# Patient Record
Sex: Male | Born: 1935 | Race: White | Hispanic: No | Smoking: Former smoker
Health system: Southern US, Community
[De-identification: ages and names within clinical notes are randomized; demographics above are authoritative.]

## PROBLEM LIST (undated history)

## (undated) DIAGNOSIS — I219 Acute myocardial infarction, unspecified: Secondary | ICD-10-CM

## (undated) DIAGNOSIS — L03119 Cellulitis of unspecified part of limb: Secondary | ICD-10-CM

## (undated) DIAGNOSIS — G709 Myoneural disorder, unspecified: Secondary | ICD-10-CM

## (undated) DIAGNOSIS — M1990 Unspecified osteoarthritis, unspecified site: Secondary | ICD-10-CM

## (undated) DIAGNOSIS — J449 Chronic obstructive pulmonary disease, unspecified: Secondary | ICD-10-CM

## (undated) DIAGNOSIS — I700 Atherosclerosis of aorta: Secondary | ICD-10-CM

## (undated) DIAGNOSIS — I639 Cerebral infarction, unspecified: Secondary | ICD-10-CM

## (undated) DIAGNOSIS — I5020 Unspecified systolic (congestive) heart failure: Secondary | ICD-10-CM

## (undated) DIAGNOSIS — I209 Angina pectoris, unspecified: Secondary | ICD-10-CM

## (undated) DIAGNOSIS — E119 Type 2 diabetes mellitus without complications: Secondary | ICD-10-CM

## (undated) DIAGNOSIS — I2510 Atherosclerotic heart disease of native coronary artery without angina pectoris: Secondary | ICD-10-CM

## (undated) DIAGNOSIS — I6529 Occlusion and stenosis of unspecified carotid artery: Secondary | ICD-10-CM

## (undated) DIAGNOSIS — R011 Cardiac murmur, unspecified: Secondary | ICD-10-CM

## (undated) DIAGNOSIS — J189 Pneumonia, unspecified organism: Secondary | ICD-10-CM

## (undated) DIAGNOSIS — I509 Heart failure, unspecified: Secondary | ICD-10-CM

## (undated) DIAGNOSIS — E785 Hyperlipidemia, unspecified: Secondary | ICD-10-CM

## (undated) DIAGNOSIS — E1151 Type 2 diabetes mellitus with diabetic peripheral angiopathy without gangrene: Secondary | ICD-10-CM

## (undated) DIAGNOSIS — I255 Ischemic cardiomyopathy: Secondary | ICD-10-CM

## (undated) DIAGNOSIS — N400 Enlarged prostate without lower urinary tract symptoms: Secondary | ICD-10-CM

## (undated) DIAGNOSIS — K219 Gastro-esophageal reflux disease without esophagitis: Secondary | ICD-10-CM

## (undated) DIAGNOSIS — I10 Essential (primary) hypertension: Secondary | ICD-10-CM

## (undated) HISTORY — DX: Atherosclerotic heart disease of native coronary artery without angina pectoris: I25.10

## (undated) HISTORY — DX: Myoneural disorder, unspecified: G70.9

## (undated) HISTORY — DX: Hyperlipidemia, unspecified: E78.5

## (undated) HISTORY — DX: Unspecified systolic (congestive) heart failure: I50.20

## (undated) HISTORY — DX: Essential (primary) hypertension: I10

## (undated) HISTORY — DX: Chronic obstructive pulmonary disease, unspecified: J44.9

## (undated) HISTORY — PX: WRIST SURGERY: SHX841

## (undated) HISTORY — PX: UPPER GASTROINTESTINAL ENDOSCOPY: SHX188

## (undated) HISTORY — DX: Type 2 diabetes mellitus with diabetic peripheral angiopathy without gangrene: E11.51

## (undated) HISTORY — DX: Ischemic cardiomyopathy: I25.5

## (undated) HISTORY — DX: Occlusion and stenosis of unspecified carotid artery: I65.29

## (undated) HISTORY — DX: Cerebral infarction, unspecified: I63.9

## (undated) HISTORY — DX: Cellulitis of unspecified part of limb: L03.119

## (undated) NOTE — *Deleted (*Deleted)
So I am now dealing with pain in Henry Lindsey thigh where the tourniquet was placed yesterday. Dr. Odis Lindsey thought it was the muscle so he gave a one time dose of Robaxin. He is still having 7/10 pain there after the muscle relaxer, 2 mg of Morphine, and a total of 20 oxycodones . I see he has had a blood clot in the past is there anyway we may can do an ultrasound or if you have any ideas I would love to hear your input. Henry Lindsey stated that he spoke with Dr Henry Lindsey. That is a very uncommon presentation for DVT post op day one. Plus he is on Xarelto. Usually it's from the tourniquet. We think he is okay to discharge. He was supposed to follow up with me on Monday for dressing change anyway. Just have him call the office [phone] and change that to 10am on this Friday

---

## 2000-07-03 ENCOUNTER — Ambulatory Visit (HOSPITAL_COMMUNITY): Admission: RE | Admit: 2000-07-03 | Discharge: 2000-07-03 | Payer: Self-pay | Admitting: *Deleted

## 2010-11-21 DIAGNOSIS — I639 Cerebral infarction, unspecified: Secondary | ICD-10-CM

## 2010-11-21 DIAGNOSIS — I6529 Occlusion and stenosis of unspecified carotid artery: Secondary | ICD-10-CM

## 2010-11-21 HISTORY — DX: Cerebral infarction, unspecified: I63.9

## 2010-11-21 HISTORY — DX: Occlusion and stenosis of unspecified carotid artery: I65.29

## 2010-12-09 ENCOUNTER — Inpatient Hospital Stay: Payer: Self-pay | Admitting: Internal Medicine

## 2011-05-30 ENCOUNTER — Encounter: Payer: Self-pay | Admitting: Internal Medicine

## 2011-06-14 ENCOUNTER — Other Ambulatory Visit: Payer: Self-pay | Admitting: Internal Medicine

## 2011-06-14 MED ORDER — OXYCODONE HCL 15 MG PO TB12: 15.0000 mg | ORAL_TABLET | Freq: Two times a day (BID) | ORAL | Status: DC

## 2011-06-14 MED ORDER — HYDROCODONE-ACETAMINOPHEN 5-500 MG PO TABS: 1.0000 | ORAL_TABLET | Freq: Four times a day (QID) | ORAL | Status: DC

## 2011-06-24 ENCOUNTER — Other Ambulatory Visit: Payer: Self-pay | Admitting: Internal Medicine

## 2011-07-08 ENCOUNTER — Telehealth: Payer: Self-pay | Admitting: Internal Medicine

## 2011-07-08 NOTE — Telephone Encounter (Signed)
Pt would like to know if you have 25-75 insulin samples that he takes.  Please advise pt

## 2011-07-09 NOTE — Telephone Encounter (Signed)
Tried calling patient, but got no answer. Will try again later.

## 2011-07-11 ENCOUNTER — Other Ambulatory Visit: Payer: Self-pay | Admitting: Internal Medicine

## 2011-07-11 NOTE — Telephone Encounter (Signed)
Pt walked in wanting rx for oxcontin 15mg   Please advise pt when rx is ready

## 2011-07-11 NOTE — Telephone Encounter (Signed)
Pt also wanted dr Darrick Huntsman know that dr Katrinka Blazing @ [facility] is starting a series of efferxx shots on left knee  This started Monday 06/28/11/rbh

## 2011-07-12 MED ORDER — OXYCODONE HCL 15 MG PO TB12: 15.0000 mg | ORAL_TABLET | Freq: Two times a day (BID) | ORAL | Status: DC

## 2011-08-05 ENCOUNTER — Ambulatory Visit (INDEPENDENT_AMBULATORY_CARE_PROVIDER_SITE_OTHER): Payer: 59 | Admitting: Internal Medicine

## 2011-08-05 ENCOUNTER — Encounter: Payer: Self-pay | Admitting: Internal Medicine

## 2011-08-05 DIAGNOSIS — C44319 Basal cell carcinoma of skin of other parts of face: Secondary | ICD-10-CM

## 2011-08-05 DIAGNOSIS — I639 Cerebral infarction, unspecified: Secondary | ICD-10-CM

## 2011-08-05 DIAGNOSIS — M1712 Unilateral primary osteoarthritis, left knee: Secondary | ICD-10-CM

## 2011-08-05 DIAGNOSIS — E1142 Type 2 diabetes mellitus with diabetic polyneuropathy: Secondary | ICD-10-CM | POA: Insufficient documentation

## 2011-08-05 DIAGNOSIS — I10 Essential (primary) hypertension: Secondary | ICD-10-CM

## 2011-08-05 DIAGNOSIS — E785 Hyperlipidemia, unspecified: Secondary | ICD-10-CM | POA: Insufficient documentation

## 2011-08-05 DIAGNOSIS — I6350 Cerebral infarction due to unspecified occlusion or stenosis of unspecified cerebral artery: Secondary | ICD-10-CM

## 2011-08-05 DIAGNOSIS — E119 Type 2 diabetes mellitus without complications: Secondary | ICD-10-CM

## 2011-08-05 DIAGNOSIS — Z8673 Personal history of transient ischemic attack (TIA), and cerebral infarction without residual deficits: Secondary | ICD-10-CM | POA: Insufficient documentation

## 2011-08-05 DIAGNOSIS — Z85828 Personal history of other malignant neoplasm of skin: Secondary | ICD-10-CM | POA: Insufficient documentation

## 2011-08-05 DIAGNOSIS — M1710 Unilateral primary osteoarthritis, unspecified knee: Secondary | ICD-10-CM

## 2011-08-05 MED ORDER — GLIPIZIDE 10 MG PO TABS: 10.0000 mg | ORAL_TABLET | Freq: Two times a day (BID) | ORAL | Status: DC

## 2011-08-05 MED ORDER — CLOPIDOGREL BISULFATE 75 MG PO TABS: 75.0000 mg | ORAL_TABLET | Freq: Every day | ORAL | Status: DC

## 2011-08-05 MED ORDER — INSULIN REGULAR HUMAN 100 UNIT/ML IJ SOLN: INTRAMUSCULAR | Status: DC

## 2011-08-05 MED ORDER — "INSULIN SYRINGE 29G X 1/2"" 0.5 ML MISC": 1.0000 | Freq: Three times a day (TID) | Status: DC

## 2011-08-05 MED ORDER — ATORVASTATIN CALCIUM 40 MG PO TABS: 40.0000 mg | ORAL_TABLET | Freq: Every day | ORAL | Status: DC

## 2011-08-05 NOTE — Patient Instructions (Addendum)
We are using hydrocodone for pain control  Up to 4 daily as needed for knee pain .  The glipizide/metformin is expensive because it is a combination of two cheap meds.  We will split them up to save money.    Continue metformin 1000 mg twice daily and add glipizide 10 mg twice daily.    We will change the insulin to a bottle and will have you give 10 units three times daily before your meal.  Do not skip meals   We are doubling the dose of atorvastatin so you can take 1/2 daily to save money

## 2011-08-05 NOTE — Assessment & Plan Note (Signed)
On statin therapy.  Lipids due. Goal LDL < 70

## 2011-08-05 NOTE — Progress Notes (Signed)
  Subjective:    Patient ID: FREEDOM PEDDY, male    DOB: September 14, 1933, 77 y.o.   MRN: [id number]  HPI  Mr. Binh is a 77 yo white male with a history of diabetes, hypertension, hyperlipidemia, cerebrovascular disease is here for followup on chronic medical issues.  His chief concern is the financial impact of being in the doughnut hole.  His medications are costing him over $  400/month.         Review of Systems  Constitutional: Negative for fever, chills, diaphoresis, activity change, appetite change, fatigue and unexpected weight change.  HENT: Negative for hearing loss, ear pain, nosebleeds, congestion, sore throat, facial swelling, rhinorrhea, sneezing, drooling, mouth sores, trouble swallowing, neck pain, neck stiffness, dental problem, voice change, postnasal drip, sinus pressure, tinnitus and ear discharge.   Eyes: Negative for photophobia, pain, discharge, redness, itching and visual disturbance.  Respiratory: Negative for apnea, cough, choking, chest tightness, shortness of breath, wheezing and stridor.   Cardiovascular: Negative for chest pain, palpitations and leg swelling.  Gastrointestinal: Negative for nausea, vomiting, abdominal pain, diarrhea, constipation, blood in stool, abdominal distention, anal bleeding and rectal pain.  Genitourinary: Negative for dysuria, urgency, frequency, hematuria, flank pain, decreased urine volume, scrotal swelling, difficulty urinating and testicular pain.  Musculoskeletal: Negative for myalgias, back pain, joint swelling, arthralgias and gait problem.  Skin: Negative for color change, rash and wound.  Neurological: Negative for dizziness, tremors, seizures, syncope, speech difficulty, weakness, light-headedness, numbness and headaches.  Psychiatric/Behavioral: Negative for suicidal ideas, hallucinations, behavioral problems, confusion, sleep disturbance, dysphoric mood, decreased concentration and agitation. The patient is not nervous/anxious.           Objective:   Physical Exam  Constitutional: He is oriented to person, place, and time.  HENT:  Head: Normocephalic and atraumatic.  Mouth/Throat: Oropharynx is clear and moist.  Eyes: Conjunctivae and EOM are normal.  Neck: Normal range of motion. Neck supple. No JVD present. No thyromegaly present.  Cardiovascular: Normal rate, regular rhythm and normal heart sounds.   Pulmonary/Chest: Effort normal and breath sounds normal. He has no wheezes. He has no rales.  Abdominal: Soft. Bowel sounds are normal. He exhibits no mass. There is no tenderness. There is no rebound.  Musculoskeletal: Normal range of motion. He exhibits no edema.  Neurological: He is alert and oriented to person, place, and time.  Skin: Skin is warm and dry.  Psychiatric: He has a normal mood and affect.          Assessment & Plan:

## 2011-08-05 NOTE — Assessment & Plan Note (Signed)
Changing medications to save money, to Regular insulin tid, and splitting metformin and glipizide.

## 2011-08-05 NOTE — Assessment & Plan Note (Signed)
Changing antiplatelet to plavix/asa to save money; continue statin, hypertension control

## 2011-08-05 NOTE — Assessment & Plan Note (Signed)
Not currently at goal.  He was asked to bring his home bp cuff to his next lab visit for repeat BP check.

## 2011-08-06 ENCOUNTER — Other Ambulatory Visit (INDEPENDENT_AMBULATORY_CARE_PROVIDER_SITE_OTHER): Payer: 59 | Admitting: Internal Medicine

## 2011-08-06 DIAGNOSIS — I10 Essential (primary) hypertension: Secondary | ICD-10-CM

## 2011-08-06 DIAGNOSIS — E785 Hyperlipidemia, unspecified: Secondary | ICD-10-CM

## 2011-08-06 DIAGNOSIS — E119 Type 2 diabetes mellitus without complications: Secondary | ICD-10-CM

## 2011-08-06 LAB — LIPID PANEL
HDL: 32.9 mg/dL — ABNORMAL LOW (ref >39.00)
Triglycerides: 159 mg/dL — ABNORMAL HIGH (ref 0.0–149.0)

## 2011-08-06 LAB — COMPREHENSIVE METABOLIC PANEL
BUN: 15 mg/dL (ref 6–23)
CO2: 30 mEq/L (ref 19–32)
Calcium: 9.5 mg/dL (ref 8.4–10.5)
Chloride: 103 mEq/L (ref 96–112)
Creatinine, Ser: 1.1 mg/dL (ref 0.4–1.5)
GFR: 67.7 mL/min (ref >60.00)
Glucose, Bld: 152 mg/dL — ABNORMAL HIGH (ref 70–99)

## 2011-08-12 ENCOUNTER — Telehealth: Payer: Self-pay | Admitting: Internal Medicine

## 2011-08-12 MED ORDER — OXYCODONE HCL 15 MG PO TB12: 15.0000 mg | ORAL_TABLET | Freq: Two times a day (BID) | ORAL | Status: DC

## 2011-08-12 NOTE — Telephone Encounter (Signed)
Pt  Needs refill oxycontin 15mg  tablets take 1 tablet by mouth twice daily Please call when rx is ready  Pt would also like results to his lab work he had done last week

## 2011-08-12 NOTE — Telephone Encounter (Signed)
Sent refill request to Dr. Darrick Huntsman and patient notified of lab result.

## 2011-08-19 ENCOUNTER — Other Ambulatory Visit: Payer: Self-pay | Admitting: Internal Medicine

## 2011-08-19 DIAGNOSIS — M1710 Unilateral primary osteoarthritis, unspecified knee: Secondary | ICD-10-CM

## 2011-08-19 NOTE — Telephone Encounter (Signed)
Ok to refill mr Henry Lindsey's hydrocodone rx ., will print out new rx with refills for pharmacy

## 2011-09-10 ENCOUNTER — Other Ambulatory Visit: Payer: Self-pay | Admitting: Internal Medicine

## 2011-09-10 MED ORDER — OXYCODONE HCL 15 MG PO TB12: 15.0000 mg | ORAL_TABLET | Freq: Two times a day (BID) | ORAL | Status: DC

## 2011-09-10 NOTE — Telephone Encounter (Signed)
Pt came in needs refill on  oxycontin 15 mg tablets  Please call when rx is ready  Pt also wants to get rx for pneumonia shot send this to  [facility]  [address]

## 2011-09-10 NOTE — Telephone Encounter (Signed)
Is it okay to send rx for pneumonia vaccine?

## 2011-09-10 NOTE — Telephone Encounter (Signed)
I HAVE PRINTED OUT REFILLS FOR NOV AND DEC

## 2011-09-11 NOTE — Telephone Encounter (Signed)
Patient notified Rx is ready to be picked up.  

## 2011-09-19 ENCOUNTER — Other Ambulatory Visit: Payer: Self-pay | Admitting: Internal Medicine

## 2011-09-19 MED ORDER — PNEUMOCOCCAL VAC POLYVALENT 25 MCG/0.5ML IJ INJ: 0.5000 mL | INJECTION | Freq: Once | INTRAMUSCULAR | Status: DC

## 2011-09-20 ENCOUNTER — Other Ambulatory Visit: Payer: Self-pay | Admitting: Internal Medicine

## 2011-09-20 MED ORDER — AMLODIPINE BESYLATE 10 MG PO TABS: 10.0000 mg | ORAL_TABLET | Freq: Every day | ORAL | Status: DC

## 2011-09-26 ENCOUNTER — Other Ambulatory Visit: Payer: Self-pay | Admitting: Internal Medicine

## 2011-09-26 NOTE — Telephone Encounter (Signed)
Please let Henry Lindsey know that now that he is taking oxycontin for his pain,  He needs to  stop the soma because the combination is very addictive and not recommedned in patients over 65.  So i am not refilling the soma anymore.

## 2011-09-27 ENCOUNTER — Telehealth: Payer: Self-pay | Admitting: Internal Medicine

## 2011-09-27 NOTE — Telephone Encounter (Signed)
Patient has been notified

## 2011-09-27 NOTE — Telephone Encounter (Signed)
Patient stopped by and would like to discontinue his oxycontin 15 mg, he states they don't seem to be working. He would like to continue on his Soma 3x a day 350mg , he uses [facility] on [address] [place].  Please advise.

## 2011-09-27 NOTE — Telephone Encounter (Signed)
I advised patient he needs to make an appt to discuss this with you but he still wanted this message sent.  Please advise.

## 2011-09-30 NOTE — Telephone Encounter (Signed)
Denied.  Tresa Garter is on the list of medications that should not be prescribed for people over 65 and I will not prescribe it anymore.

## 2011-10-01 NOTE — Telephone Encounter (Signed)
Patient notified

## 2011-10-23 ENCOUNTER — Other Ambulatory Visit: Payer: Self-pay | Admitting: Internal Medicine

## 2011-11-04 ENCOUNTER — Other Ambulatory Visit: Payer: Self-pay | Admitting: Internal Medicine

## 2011-11-08 ENCOUNTER — Telehealth: Payer: Self-pay | Admitting: *Deleted

## 2011-11-08 NOTE — Telephone Encounter (Signed)
I do not endorse diabetic shoes,  They are not proven to be effective nor necessary

## 2011-11-08 NOTE — Telephone Encounter (Signed)
Form stating denied faxed back to company

## 2011-11-08 NOTE — Telephone Encounter (Signed)
Form for diabetic shoes is in your red folder.  I checked with the pt's wife and he does want these shoes.

## 2011-11-11 ENCOUNTER — Encounter: Payer: Self-pay | Admitting: Internal Medicine

## 2011-11-11 ENCOUNTER — Ambulatory Visit (INDEPENDENT_AMBULATORY_CARE_PROVIDER_SITE_OTHER): Payer: 59 | Admitting: Internal Medicine

## 2011-11-11 ENCOUNTER — Telehealth: Payer: Self-pay | Admitting: Internal Medicine

## 2011-11-11 DIAGNOSIS — I10 Essential (primary) hypertension: Secondary | ICD-10-CM

## 2011-11-11 DIAGNOSIS — E119 Type 2 diabetes mellitus without complications: Secondary | ICD-10-CM

## 2011-11-11 DIAGNOSIS — E785 Hyperlipidemia, unspecified: Secondary | ICD-10-CM

## 2011-11-11 MED ORDER — CARISOPRODOL 350 MG PO TABS: 350.0000 mg | ORAL_TABLET | Freq: Three times a day (TID) | ORAL | Status: DC | PRN

## 2011-11-11 MED ORDER — PNEUMOCOCCAL VAC POLYVALENT 25 MCG/0.5ML IJ INJ: 0.5000 mL | INJECTION | Freq: Once | INTRAMUSCULAR | Status: DC

## 2011-11-11 NOTE — Assessment & Plan Note (Signed)
secondary to hypertriglyceridemia .  He has been taking fenofibrate ,  Samples of Trilipix given.

## 2011-11-11 NOTE — Progress Notes (Signed)
Subjective:    Patient ID: Henry Lindsey, male    DOB: 07/22/1933, 78 y.o.   MRN: [id number]  HPI  Henry Lindsey is a 78 yr old white male with history of hypertension, hyperlipidemia, DJD affecting both knees, and Diabetes who presents for 3 month followup.,  He has lost the nails off of both great toes since using conservative treatment for onychomycosis. He states that he has been filing the top of both nails daily after shower and applying vinegar and or and about 2 weeks ago and he notes that the nails so dissolved leaving some cuticle behind which is removed. He has not any pain or bleeding. He received a cortisone injection in both knees 10 days ago and has noted elevations from 160 to 180 both fasting and post prandially.  He is using regular insulin 10 units tid pre meal and is is not carbohydrate counting or adjusting his insulin. He prefers to use insulin vials to save money since he is now on the donut hole. Past Medical History  Diagnosis Date  . Diabetes mellitus   . Neuromuscular disorder   . Hyperlipidemia   . Hypertension   . CVA (cerebral infarction)    Current Outpatient Prescriptions on File Prior to Visit  Medication Sig Dispense Refill  . amLODipine (NORVASC) 10 MG tablet Take 1 tablet (10 mg total) by mouth daily.  30 tablet  3  . atorvastatin (LIPITOR) 40 MG tablet Take 1 tablet (40 mg total) by mouth daily.  30 tablet  6  . clopidogrel (PLAVIX) 75 MG tablet Take 1 tablet (75 mg total) by mouth daily.  30 tablet  11  . fenofibrate micronized (LOFIBRA) 134 MG capsule Take 134 mg by mouth daily.        [name] glipiZIDE (GLUCOTROL) 10 MG tablet Take 1 tablet (10 mg total) by mouth 2 (two) times daily.  60 tablet  11  . HYDROcodone-acetaminophen (VICODIN) 5-500 MG per tablet TAKE 1 TABLET BY MOUTH FOUR TIMES DAILY  120 tablet  3  . Insulin Lispro Prot & Lispro (HUMALOG MIX 75/25 KWIKPEN [place]) Inject 20 Units into the skin 2 (two) times daily.        . insulin regular (HUMULIN  R,NOVOLIN R) 100 units/mL injection 10 units three times daily before meals.  10 mL  3  . INSULIN SYRINGE .5CC/29G 29G X 1/2" 0.5 ML MISC 1 Syringe by Does not apply route 3 (three) times daily.  100 each  6  . metFORMIN (GLUCOPHAGE) 1000 MG tablet Take 1,000 mg by mouth 2 (two) times daily with a meal.        . metoprolol succinate (TOPROL-XL) 100 MG 24 hr tablet TAKE ONE TABLET BY MOUTH DAILY  30 tablet  6    Review of Systems  Constitutional: Negative for fever, chills, diaphoresis, activity change, appetite change, fatigue and unexpected weight change.  HENT: Negative for hearing loss, ear pain, nosebleeds, congestion, sore throat, facial swelling, rhinorrhea, sneezing, drooling, mouth sores, trouble swallowing, neck pain, neck stiffness, dental problem, voice change, postnasal drip, sinus pressure, tinnitus and ear discharge.   Eyes: Negative for photophobia, pain, discharge, redness, itching and visual disturbance.  Respiratory: Negative for apnea, cough, choking, chest tightness, shortness of breath, wheezing and stridor.   Cardiovascular: Negative for chest pain, palpitations and leg swelling.  Gastrointestinal: Negative for nausea, vomiting, abdominal pain, diarrhea, constipation, blood in stool, abdominal distention, anal bleeding and rectal pain.  Genitourinary: Negative for dysuria, urgency, frequency, hematuria, flank  pain, decreased urine volume, scrotal swelling, difficulty urinating and testicular pain.  Musculoskeletal: Negative for myalgias, back pain, joint swelling, arthralgias and gait problem.  Skin: Negative for color change, rash and wound.  Neurological: Negative for dizziness, tremors, seizures, syncope, speech difficulty, weakness, light-headedness, numbness and headaches.  Psychiatric/Behavioral: Negative for suicidal ideas, hallucinations, behavioral problems, confusion, sleep disturbance, dysphoric mood, decreased concentration and agitation. The patient is not  nervous/anxious.    BP 140/82  Pulse 66  Temp(Src) 97.6 F (36.4 C) (Oral)  Wt 199 lb (90.266 kg)  SpO2 97%     Objective:   Physical Exam  Constitutional: He is oriented to person, place, and time.  HENT:  Head: Normocephalic and atraumatic.  Mouth/Throat: Oropharynx is clear and moist.  Eyes: Conjunctivae and EOM are normal.  Neck: Normal range of motion. Neck supple. No JVD present. No thyromegaly present.  Cardiovascular: Normal rate, regular rhythm and normal heart sounds.   Pulmonary/Chest: Effort normal and breath sounds normal. He has no wheezes. He has no rales.  Abdominal: Soft. Bowel sounds are normal. He exhibits no mass. There is no tenderness. There is no rebound.  Musculoskeletal: Normal range of motion. He exhibits no edema.  Neurological: He is alert and oriented to person, place, and time. He has normal strength. No cranial nerve deficit or sensory deficit.  Skin: Skin is warm and dry.  Psychiatric: He has a normal mood and affect.      Assessment & Plan:   Diabetes mellitus type II, uncontrolled Due for hgba1c  Today.  Had long discussion about diabetic shoes which he wants, which I will not  endorse.   Has been taking 10 units of Humulin insulin before each meal but does not understand how the insulin can affect him.  He refuses to attend education classes .  Samples of Novolog rapid actin insulin   Hypertension well controlled   Hyperlipidemia secondary to hypertriglyceridemia .  He has been taking fenofibrate ,  Samples of Trilipix given.     Updated Medication List Outpatient Encounter Prescriptions as of 11/11/2011  Medication Sig Dispense Refill  . amLODipine (NORVASC) 10 MG tablet Take 1 tablet (10 mg total) by mouth daily.  30 tablet  3  . atorvastatin (LIPITOR) 40 MG tablet Take 1 tablet (40 mg total) by mouth daily.  30 tablet  6  . carisoprodol (SOMA) 350 MG tablet Take 1 tablet (350 mg total) by mouth 3 (three) times daily as needed for  muscle spasms.  90 tablet  3  . clopidogrel (PLAVIX) 75 MG tablet Take 1 tablet (75 mg total) by mouth daily.  30 tablet  11  . fenofibrate micronized (LOFIBRA) 134 MG capsule Take 134 mg by mouth daily.        [name] glipiZIDE (GLUCOTROL) 10 MG tablet Take 1 tablet (10 mg total) by mouth 2 (two) times daily.  60 tablet  11  . HYDROcodone-acetaminophen (VICODIN) 5-500 MG per tablet TAKE 1 TABLET BY MOUTH FOUR TIMES DAILY  120 tablet  3  . Insulin Lispro Prot & Lispro (HUMALOG MIX 75/25 KWIKPEN [place]) Inject 20 Units into the skin 2 (two) times daily.        . insulin regular (HUMULIN R,NOVOLIN R) 100 units/mL injection 10 units three times daily before meals.  10 mL  3  . INSULIN SYRINGE .5CC/29G 29G X 1/2" 0.5 ML MISC 1 Syringe by Does not apply route 3 (three) times daily.  100 each  6  . metFORMIN (GLUCOPHAGE)  1000 MG tablet Take 1,000 mg by mouth 2 (two) times daily with a meal.        . metoprolol succinate (TOPROL-XL) 100 MG 24 hr tablet TAKE ONE TABLET BY MOUTH DAILY  30 tablet  6  . DISCONTD: carisoprodol (SOMA) 350 MG tablet TAKE 1 TABLET BY MOUTH THREE TIMES DAILY AS NEEDED FOR BACK PAIN  90 tablet  0  . DISCONTD: carisoprodol (SOMA) 350 MG tablet Take 1 tablet (350 mg total) by mouth 3 (three) times daily as needed for muscle spasms.  90 tablet  3  . DISCONTD: oxyCODONE (OXYCONTIN) 15 MG TB12 Take 1 tablet (15 mg total) by mouth 2 (two) times daily.  60 tablet  0  . DISCONTD: oxyCODONE (OXYCONTIN) 15 MG TB12 Take 1 tablet (15 mg total) by mouth every 12 (twelve) hours.  60 tablet  0  . pneumococcal 23 valent vaccine (PNEUMOVAX 23) 25 MCG/0.5ML injection Inject 0.5 mLs into the muscle once.  2.5 mL  0

## 2011-11-11 NOTE — Assessment & Plan Note (Signed)
well controlled  

## 2011-11-11 NOTE — Assessment & Plan Note (Addendum)
Due for hgba1c  Today.  Had long discussion about diabetic shoes which he wants, which I will not  endorse.   Has been taking 10 units of Humulin insulin before each meal but does not understand how the insulin can affect him.  He refuses to attend education classes .  Samples of Novolog rapid actin insulin

## 2011-11-11 NOTE — Telephone Encounter (Signed)
Pt stated that [facility] will be sending a request for knee brace  [facility] [phone] Ilsa Iha

## 2011-11-11 NOTE — Patient Instructions (Signed)
   If you skip a meal and only eat some peanut butter crackers, only take 1/2 of your usual dose of insulin    We may be increasing or changing your insulin dose based on your blood sugars.   I have given you samples of Novolog to use prior to each meal.     I have given you samples of Trilipix to take instead of generic fenifibrate.   We will check you r labs today

## 2011-11-12 ENCOUNTER — Telehealth: Payer: Self-pay | Admitting: Internal Medicine

## 2011-11-12 LAB — LDL CHOLESTEROL, DIRECT: Direct LDL: 91.8 mg/dL

## 2011-11-12 NOTE — Telephone Encounter (Signed)
Will wait for request

## 2011-11-12 NOTE — Telephone Encounter (Signed)
[phone]  Pt called said his rx for soma has not been called in the [facility] [address]

## 2011-11-13 MED ORDER — ATORVASTATIN CALCIUM 80 MG PO TABS: 80.0000 mg | ORAL_TABLET | Freq: Every day | ORAL | Status: DC

## 2011-11-13 MED ORDER — CARISOPRODOL 350 MG PO TABS: 350.0000 mg | ORAL_TABLET | Freq: Three times a day (TID) | ORAL | Status: DC | PRN

## 2011-11-13 NOTE — Telephone Encounter (Signed)
Medicine called to [facility]. 

## 2011-11-13 NOTE — Telephone Encounter (Signed)
What is the quantity on the soma?

## 2011-11-13 NOTE — Telephone Encounter (Signed)
Ok to call in soma rx #0 with 3 refils

## 2011-11-13 NOTE — Telephone Encounter (Signed)
Sorry about that!  #90

## 2011-11-13 NOTE — Progress Notes (Signed)
Addended by: Duncan Dull on: 11/13/2011 01:16 PM   Modules accepted: Orders

## 2011-11-28 ENCOUNTER — Telehealth: Payer: Self-pay | Admitting: *Deleted

## 2011-11-28 NOTE — Telephone Encounter (Signed)
Pt has brought in lists of his blood glucose readings.  These are in the red folder.

## 2011-12-02 ENCOUNTER — Telehealth: Payer: Self-pay | Admitting: Internal Medicine

## 2011-12-02 NOTE — Telephone Encounter (Signed)
Blood sugars reviewed.  His sugars vary greatly and are not consistent,  So he is going to have to give me two more weeks of blood sugars with a brief description  of the foods he ate in the meal prior to each measurement of blood sugar .  Also is he  Using the rapid acting insulin 3 times daily  before each meal or the 75/25 premixed insulin twice daily,  Or both?

## 2011-12-03 MED ORDER — GLUCOSE BLOOD VI STRP: ORAL_STRIP | Status: DC

## 2011-12-03 NOTE — Telephone Encounter (Signed)
Advised pt.  He says he is using both types of insulin daily.  He's out of test strips, says he will check on the price of them and if he gets them he will keep a log of his readings and send it in.  Test strips sent to pharmacy.

## 2011-12-13 ENCOUNTER — Telehealth: Payer: Self-pay | Admitting: *Deleted

## 2011-12-13 NOTE — Telephone Encounter (Signed)
Prior auth needed for carisoprodol, form is in red folder.

## 2011-12-17 ENCOUNTER — Other Ambulatory Visit: Payer: Self-pay | Admitting: Internal Medicine

## 2011-12-17 NOTE — Telephone Encounter (Signed)
PA has been faxed in.  

## 2011-12-26 ENCOUNTER — Other Ambulatory Visit: Payer: Self-pay | Admitting: Internal Medicine

## 2011-12-26 DIAGNOSIS — E1165 Type 2 diabetes mellitus with hyperglycemia: Secondary | ICD-10-CM

## 2011-12-26 NOTE — Assessment & Plan Note (Signed)
Log of sugars was ambigouous but all mealtime cbvgs have been oelevated from 150 to 200, less so during the past week.,  No changes made to regimen of novolog 10 untis tid.

## 2011-12-30 ENCOUNTER — Telehealth: Payer: Self-pay | Admitting: Internal Medicine

## 2012-01-01 ENCOUNTER — Other Ambulatory Visit: Payer: Self-pay | Admitting: Internal Medicine

## 2012-01-10 ENCOUNTER — Other Ambulatory Visit: Payer: Self-pay | Admitting: Internal Medicine

## 2012-01-10 MED ORDER — HYDROCODONE-ACETAMINOPHEN 5-500 MG PO TABS: 1.0000 | ORAL_TABLET | Freq: Four times a day (QID) | ORAL | Status: DC | PRN

## 2012-02-02 ENCOUNTER — Other Ambulatory Visit: Payer: Self-pay | Admitting: Internal Medicine

## 2012-02-04 NOTE — Telephone Encounter (Signed)
Opened in error

## 2012-02-12 ENCOUNTER — Telehealth: Payer: Self-pay | Admitting: Internal Medicine

## 2012-02-12 NOTE — Telephone Encounter (Signed)
He can come by for samples.  Of either the humalog pen (has 300 units of insulin) or the bottle (has 1000 units )

## 2012-02-12 NOTE — Telephone Encounter (Signed)
[phone] Pt stated you were going to give him a sample of hemalog insuln.

## 2012-02-17 NOTE — Telephone Encounter (Signed)
Per sharon scates we have sample.  Sample in refrig with pt name on it

## 2012-03-09 ENCOUNTER — Other Ambulatory Visit: Payer: Self-pay | Admitting: Internal Medicine

## 2012-03-09 NOTE — Telephone Encounter (Signed)
Soma request [last refill 01.23.13 #[id number]] Please advise.

## 2012-03-19 ENCOUNTER — Telehealth: Payer: Self-pay | Admitting: Internal Medicine

## 2012-03-19 NOTE — Telephone Encounter (Signed)
You can call him in diazepam 5 mg  One tablet at bedtime as needed for insomnia #30 no refills,.  Please caution him to refrain from using his soma within 2 hours of taking  this medication

## 2012-03-19 NOTE — Telephone Encounter (Signed)
Needing something to help him sleep having a difficult time since his wife died.

## 2012-03-20 MED ORDER — DIAZEPAM 5 MG PO TABS: 5.0000 mg | ORAL_TABLET | Freq: Every evening | ORAL | Status: AC | PRN

## 2012-03-20 NOTE — Telephone Encounter (Signed)
Patients daughter notified, Rx called in.

## 2012-03-24 ENCOUNTER — Emergency Department: Payer: Self-pay | Admitting: Internal Medicine

## 2012-03-24 LAB — URINALYSIS, COMPLETE
Bacteria: NONE SEEN
Bilirubin,UR: NEGATIVE
Glucose,UR: 150 mg/dL (ref 0–75)
Protein: 30
RBC,UR: 1 /HPF (ref 0–5)
Specific Gravity: 1.02 (ref 1.003–1.030)
Squamous Epithelial: NONE SEEN
WBC UR: <1 /HPF (ref 0–5)

## 2012-03-27 ENCOUNTER — Telehealth: Payer: Self-pay | Admitting: Internal Medicine

## 2012-03-27 NOTE — Telephone Encounter (Signed)
Patient coughing up mucous . Sent to triage . Had images of ribs ,chest and head .

## 2012-03-29 ENCOUNTER — Emergency Department: Payer: Self-pay | Admitting: Emergency Medicine

## 2012-03-29 LAB — COMPREHENSIVE METABOLIC PANEL
Alkaline Phosphatase: 74 U/L (ref 50–136)
Anion Gap: 11 (ref 7–16)
BUN: 17 mg/dL (ref 7–18)
Bilirubin,Total: 0.7 mg/dL (ref 0.2–1.0)
Calcium, Total: 8.7 mg/dL (ref 8.5–10.1)
Co2: 25 mmol/L (ref 21–32)
Creatinine: 1.2 mg/dL (ref 0.60–1.30)
EGFR (African American): >60
EGFR (Non-African Amer.): 58 — ABNORMAL LOW
Osmolality: 284 (ref 275–301)

## 2012-03-29 LAB — CBC
HGB: 12.7 g/dL — ABNORMAL LOW (ref 13.0–18.0)
MCH: 27.2 pg (ref 26.0–34.0)
MCHC: 32.1 g/dL (ref 32.0–36.0)
MCV: 85 fL (ref 80–100)
Platelet: 225 10*3/uL (ref 150–440)
RDW: 14.5 % (ref 11.5–14.5)
WBC: 12.4 10*3/uL — ABNORMAL HIGH (ref 3.8–10.6)

## 2012-03-29 LAB — URINALYSIS, COMPLETE
Bacteria: NONE SEEN
Bilirubin,UR: NEGATIVE
Glucose,UR: 50 mg/dL (ref 0–75)
Ketone: NEGATIVE
Leukocyte Esterase: NEGATIVE
Nitrite: NEGATIVE
Protein: NEGATIVE
Squamous Epithelial: NONE SEEN

## 2012-03-29 LAB — TROPONIN I: Troponin-I: <0.02 ng/mL

## 2012-04-02 ENCOUNTER — Other Ambulatory Visit: Payer: Self-pay | Admitting: Internal Medicine

## 2012-04-03 NOTE — Telephone Encounter (Signed)
We discussed reducing dose to twice daily at last visit, so refill is for 60

## 2012-04-07 ENCOUNTER — Ambulatory Visit (INDEPENDENT_AMBULATORY_CARE_PROVIDER_SITE_OTHER): Payer: 59 | Admitting: Internal Medicine

## 2012-04-07 ENCOUNTER — Other Ambulatory Visit: Payer: Self-pay | Admitting: Internal Medicine

## 2012-04-07 ENCOUNTER — Encounter: Payer: Self-pay | Admitting: Internal Medicine

## 2012-04-07 VITALS — BP 142/86 | HR 75 | Temp 98.2°F | Resp 16 | Wt 189.5 lb

## 2012-04-07 DIAGNOSIS — J189 Pneumonia, unspecified organism: Secondary | ICD-10-CM

## 2012-04-07 DIAGNOSIS — E119 Type 2 diabetes mellitus without complications: Secondary | ICD-10-CM

## 2012-04-07 DIAGNOSIS — R05 Cough: Secondary | ICD-10-CM

## 2012-04-07 DIAGNOSIS — E1165 Type 2 diabetes mellitus with hyperglycemia: Secondary | ICD-10-CM

## 2012-04-07 LAB — COMPLETE METABOLIC PANEL WITH GFR
ALT: 25 U/L (ref 0–53)
AST: 29 U/L (ref 0–37)
Albumin: 4.4 g/dL (ref 3.5–5.2)
Alkaline Phosphatase: 81 U/L (ref 39–117)
BUN: 19 mg/dL (ref 6–23)
Calcium: 10 mg/dL (ref 8.4–10.5)
Chloride: 102 mEq/L (ref 96–112)
Potassium: 4.6 mEq/L (ref 3.5–5.3)
Sodium: 138 mEq/L (ref 135–145)

## 2012-04-07 LAB — GLUCOSE, POCT (MANUAL RESULT ENTRY): POC Glucose: 162 mg/dl — AB (ref 70–99)

## 2012-04-07 MED ORDER — HYDROCODONE-ACETAMINOPHEN 5-500 MG PO TABS: 1.0000 | ORAL_TABLET | Freq: Three times a day (TID) | ORAL | Status: DC | PRN

## 2012-04-07 MED ORDER — AMOXICILLIN-POT CLAVULANATE 875-125 MG PO TABS: 1.0000 | ORAL_TABLET | Freq: Two times a day (BID) | ORAL | Status: AC

## 2012-04-07 NOTE — Patient Instructions (Addendum)
Continue taking 1/2 metformin tablet twice  daily  And half of an atorvastatin  80 mg tablet once  daily  At bedtime  I am prescribing augmentin for your cough.  Please go get a chest x ray   Return  in 2 weeks
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
## 2012-04-08 ENCOUNTER — Ambulatory Visit (INDEPENDENT_AMBULATORY_CARE_PROVIDER_SITE_OTHER)
Admission: RE | Admit: 2012-04-08 | Discharge: 2012-04-08 | Disposition: A | Discharge status: Home / Self Care | Payer: 59 | Source: Ambulatory Visit | Attending: Internal Medicine | Admitting: Internal Medicine

## 2012-04-08 ENCOUNTER — Encounter: Payer: Self-pay | Admitting: Internal Medicine

## 2012-04-08 DIAGNOSIS — R05 Cough: Secondary | ICD-10-CM

## 2012-04-08 DIAGNOSIS — J189 Pneumonia, unspecified organism: Secondary | ICD-10-CM | POA: Insufficient documentation

## 2012-04-08 LAB — CBC WITH DIFFERENTIAL/PLATELET
Basophils Relative: 0.6 % (ref 0.0–3.0)
Eosinophils Absolute: 0.3 10*3/uL (ref 0.0–0.7)
Eosinophils Relative: 3.8 % (ref 0.0–5.0)
HCT: 42.3 % (ref 39.0–52.0)
Lymphs Abs: 1.9 10*3/uL (ref 0.7–4.0)
MCHC: 32.2 g/dL (ref 30.0–36.0)
MCV: 85.3 fl (ref 78.0–100.0)
Monocytes Absolute: 0.5 10*3/uL (ref 0.1–1.0)
Neutrophils Relative %: 60 % (ref 43.0–77.0)
Platelets: 262 10*3/uL (ref 150.0–400.0)
WBC: 6.6 10*3/uL (ref 4.5–10.5)

## 2012-04-08 LAB — HEMOGLOBIN A1C: Hgb A1c MFr Bld: 7.8 % — ABNORMAL HIGH (ref 4.6–6.5)

## 2012-04-08 NOTE — Assessment & Plan Note (Signed)
Presumed, based on history of fevers chillas and purulent sputum.  rx'd augmentin today.  Repeat cxr ordered.

## 2012-04-08 NOTE — Assessment & Plan Note (Signed)
Using insulin, has not been checking sugars lately due to stress of recent events.  Appetite poor, checking A1c today
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
## 2012-04-10 ENCOUNTER — Telehealth: Payer: Self-pay | Admitting: Internal Medicine

## 2012-04-10 DIAGNOSIS — F4329 Adjustment disorder with other symptoms: Secondary | ICD-10-CM

## 2012-04-10 NOTE — Telephone Encounter (Signed)
Left message for patient's daughter to return my call.
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
## 2012-04-13 ENCOUNTER — Telehealth: Payer: Self-pay | Admitting: Internal Medicine

## 2012-04-13 MED ORDER — ZOLPIDEM TARTRATE 5 MG PO TABS: 5.0000 mg | ORAL_TABLET | Freq: Every evening | ORAL | Status: DC | PRN

## 2012-04-13 NOTE — Telephone Encounter (Signed)
rx called into pharmacy Spoke with daughter and advised results.  

## 2012-04-13 NOTE — Telephone Encounter (Signed)
[facility] [address] Pt daughter called pt is having a hard time sleeping at night.  Can you call him something in (ambein) to help him sleep at night

## 2012-04-13 NOTE — Telephone Encounter (Signed)
Ok to call in 5 mg ambien  See order. Patient lost his wife to cancer 2 weeks ago.

## 2012-04-15 ENCOUNTER — Telehealth: Payer: Self-pay | Admitting: Internal Medicine

## 2012-04-15 NOTE — Telephone Encounter (Signed)
Patient's daughter notified.
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
## 2012-04-15 NOTE — Telephone Encounter (Signed)
Spoke with patient's daughter and she stated that she will wait and discuss everything with Dr. Darrick Huntsman at his follow up on July 2.

## 2012-04-15 NOTE — Telephone Encounter (Signed)
Patient's daughter called back, patient is doing okay but is really struggling with sleeping. He falls asleep at 5PM but then is up a couple of hours later and is groggy all day. She said the [place] isn't working. Patient has an OV scheduled 04/21/12. If you need to call Candace/daughter back her back her # is [phone].
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
## 2012-04-21 ENCOUNTER — Encounter: Payer: Self-pay | Admitting: Internal Medicine

## 2012-04-21 ENCOUNTER — Ambulatory Visit (INDEPENDENT_AMBULATORY_CARE_PROVIDER_SITE_OTHER): Payer: 59 | Admitting: Internal Medicine

## 2012-04-21 VITALS — BP 170/80 | HR 74 | Temp 98.2°F | Resp 16 | Wt 193.0 lb

## 2012-04-21 DIAGNOSIS — E1165 Type 2 diabetes mellitus with hyperglycemia: Secondary | ICD-10-CM

## 2012-04-21 DIAGNOSIS — Z634 Disappearance and death of family member: Secondary | ICD-10-CM

## 2012-04-21 MED ORDER — HYDROCODONE-ACETAMINOPHEN 7.5-500 MG PO TABS: 1.0000 | ORAL_TABLET | Freq: Three times a day (TID) | ORAL | Status: AC | PRN

## 2012-04-21 MED ORDER — BACLOFEN 20 MG PO TABS: 20.0000 mg | ORAL_TABLET | Freq: Two times a day (BID) | ORAL | Status: AC

## 2012-04-21 NOTE — Patient Instructions (Addendum)
I am increasing the strength of your vicodin so you may refill it now.  Please try to use it only twice daily  I am changing your Soma to baclofen.  It is a safer muscle relaxer

## 2012-04-23 ENCOUNTER — Encounter: Payer: Self-pay | Admitting: Internal Medicine

## 2012-04-23 DIAGNOSIS — Z634 Disappearance and death of family member: Secondary | ICD-10-CM | POA: Insufficient documentation

## 2012-04-23 NOTE — Assessment & Plan Note (Signed)
With thankfully no broken bones or ribs.  continue Vicodin but will change to the higher dose and decrease the frequency to 2 or 3 daily. changing soma to robaxin .
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
## 2012-04-23 NOTE — Assessment & Plan Note (Signed)
I have recommended counselling through [facility].

## 2012-04-23 NOTE — Assessment & Plan Note (Signed)
With recent A!C > 8.0.  However, his grief has affected his appetite so now changes to  his insulin regimen were made in order to avoid causing hypoglycemic events.  He will return in 1 month

## 2012-05-01 ENCOUNTER — Telehealth: Payer: Self-pay | Admitting: Internal Medicine

## 2012-05-01 DIAGNOSIS — G4700 Insomnia, unspecified: Secondary | ICD-10-CM

## 2012-05-01 MED ORDER — ZOLPIDEM TARTRATE 5 MG PO TABS: 5.0000 mg | ORAL_TABLET | Freq: Every evening | ORAL | Status: DC | PRN

## 2012-05-01 MED ORDER — INSULIN LISPRO PROT & LISPRO (75-25 MIX) 100 UNIT/ML ~~LOC~~ SUSP: SUBCUTANEOUS | Status: DC

## 2012-05-01 NOTE — Telephone Encounter (Signed)
He also wanted you to know that his blood pressure was 135/63 and pulse was 67 when he just checked it.

## 2012-05-01 NOTE — Telephone Encounter (Signed)
Patient would like a prescription for Ambien called into [facility] on [address]. And he said his sugars are getting better as well. His readings that he had was all in the pm and started on 3/15 was 178, 3/16 119, 3/17 178, 3/18 112, 3/19 146, 7/10 was 172, 7/11 at 12:45 it was 109 and at 6:13 it was 169 and on 7/12 at 7:19 am it was 164.[name]  Him and his daughter state that they are getting better.

## 2012-05-01 NOTE — Telephone Encounter (Signed)
Ok to call in zolpidem 5 mg one tablet daily at bedtime prn insomnia  #30 1 refill.  . Also please ask him to increase the evening dose of insulin to 22 units (previously taking 20 units) .  Continue 20 units in the morning    Chart updated

## 2012-05-11 ENCOUNTER — Ambulatory Visit: Payer: 59 | Admitting: Internal Medicine

## 2012-05-26 ENCOUNTER — Encounter: Payer: Self-pay | Admitting: Internal Medicine

## 2012-05-26 ENCOUNTER — Ambulatory Visit (INDEPENDENT_AMBULATORY_CARE_PROVIDER_SITE_OTHER): Payer: 59 | Admitting: Internal Medicine

## 2012-05-26 ENCOUNTER — Telehealth: Payer: Self-pay | Admitting: Internal Medicine

## 2012-05-26 VITALS — BP 130/68 | HR 70 | Temp 97.9°F | Resp 16 | Wt 188.5 lb

## 2012-05-26 DIAGNOSIS — E1165 Type 2 diabetes mellitus with hyperglycemia: Secondary | ICD-10-CM

## 2012-05-26 DIAGNOSIS — Z1211 Encounter for screening for malignant neoplasm of colon: Secondary | ICD-10-CM

## 2012-05-26 DIAGNOSIS — I739 Peripheral vascular disease, unspecified: Secondary | ICD-10-CM

## 2012-05-26 LAB — HM DIABETES FOOT EXAM: HM Diabetic Foot Exam: DECREASED

## 2012-05-26 NOTE — Telephone Encounter (Signed)
Please asked Henry Lindsey if  he has had a screening colonoscopy in the last 10 years. I have no records of such. I would like to refer them to [facility] for colonoscopy if he has not had screening

## 2012-05-26 NOTE — Assessment & Plan Note (Signed)
His recent development of calf muscle pain with exertion is concerned for claudication physically he has decreased pulses and in the loss of hair growth on both legs. He has a history of diabetes and a remote history of tobacco abuse. Will refer to [facility] for evaluation with ABIs and ultrasound.

## 2012-05-26 NOTE — Assessment & Plan Note (Signed)
His blood sugars have improved with use of 7525 mixed insulin. We were discontinuing the morning dose since he is having low blood sugars at noon prior to eating. He continue 10 mg of glipizide his usual metformin dose and his lunchtime and evening doses of 7525 since his blood sugars are well controlled this time. Repeat A1c is due in late September.

## 2012-05-26 NOTE — Patient Instructions (Addendum)
Continue the glipizide but stop the breakfast dose of insulin .  This should prevent the low blood sugars at lunchtime.   Continue insulin at lunch and dinner .   Your leg symptoms sound like a circulation problem  (cramps with exertion) so I am sending you to Dr Gilda Crease for a test on your circulation.

## 2012-05-26 NOTE — Progress Notes (Signed)
Patient ID: Henry Lindsey, male   DOB: [birth year], 78 y.o.   MRN: [id number]  Patient Active Problem List  Diagnosis  . Basal Cell Carcinoma Of Cheek  . Left knee DJD  . Hyperlipidemia  . Hypertension  . CVA (cerebral infarction)  . Diabetes mellitus type II, uncontrolled  . Stroke  . Bereavement due to life event  . Claudication of calf muscles    Subjective:  CC:   Chief Complaint  Patient presents with  . Follow-up    one month    HPI:   Henry Mcnay Salmonis a 78 y.o. male who presents One month follow up on diabetes.     Sugars have been running low at noon on current regimen of metformin, glipizide and 75/25 15 units tid.  Otherwise his fastigns adn evenigs sugars have been < 150 consistently.  His left knee pain has improved after receiving of  cortisone shot by Dr Katrinka Blazing last week.  He is having a new symptom of recurrent calf pain occurring when he walks from his house to his mailbox and back. The calves are both cramping up with exertion and improving with rest.  this is been occurring for 3 weeks.  He continues to have numbness in his feet. He is sleeping better since we started the Ambien 5 mg daily.    Past Medical History  Diagnosis Date  . Diabetes mellitus   . Neuromuscular disorder   . Hyperlipidemia   . Hypertension   . CVA (cerebral infarction)     History reviewed. No pertinent past surgical history.       The following portions of the patient's history were reviewed and updated as appropriate: Allergies, current medications, and problem list.    Review of Systems:   Review of Systems  Constitutional: Negative.   HENT: Negative.   Eyes: Negative.   Respiratory: Negative.   Cardiovascular: Positive for claudication.  Gastrointestinal: Negative.   Genitourinary: Negative.   Musculoskeletal: Positive for joint pain.  Skin: Negative.   Neurological: Positive for sensory change. Negative for headaches.  Endo/Heme/Allergies: Negative.     Psychiatric/Behavioral: Positive for depression. The patient does not have insomnia.       History   Social History  . Marital Status: Married    Spouse Name: N/A    Number of Children: N/A  . Years of Education: N/A   Occupational History  . Not on file.   Social History Main Topics  . Smoking status: Former Smoker    Quit date: 04/21/1968  . Smokeless tobacco: Never Used   Comment: quit 1969  . Alcohol Use: No  . Drug Use: No  . Sexually Active: Not on file   Other Topics Concern  . Not on file   Social History Narrative  . No narrative on file    Objective:  BP 130/68  Pulse 70  Temp 97.9 F (36.6 C) (Oral)  Resp 16  Wt 188 lb 8 oz (85.503 kg)  SpO2 95%  General appearance: alert, cooperative and appears stated age Ears: normal TM's and external ear canals both ears Throat: lips, mucosa, and tongue normal; teeth and gums normal Neck: no adenopathy, no carotid bruit, supple, symmetrical, trachea midline and thyroid not enlarged, symmetric, no tenderness/mass/nodules Back: symmetric, no curvature. ROM normal. No CVA tenderness. Lungs: clear to auscultation bilaterally Heart: regular rate and rhythm, S1, S2 normal, no murmur, click, rub or gallop Abdomen: soft, non-tender; bowel sounds normal; no masses,  no organomegaly Pulses: decreased  bialteral DPs and ATs. 2+ and symmetric Skin: Skin color, texture, turgor normal. No rashes or lesions. Loss of hair noted on both calves Lymph nodes: Cervical, supraclavicular, and axillary nodes normal. Foot exam:  Nails are well trimmed,  No callouses,  Sensation spotty   Assessment and Plan:  Claudication of calf muscles His recent development of calf muscle pain with exertion is concerned for claudication physically he has decreased pulses and in the loss of hair growth on both legs. He has a history of diabetes and a remote history of tobacco abuse. Will refer to [facility] for evaluation with ABIs and  ultrasound.  Diabetes mellitus type II, uncontrolled His blood sugars have improved with use of 7525 mixed insulin. We were discontinuing the morning dose since he is having low blood sugars at noon prior to eating. He continue 10 mg of glipizide his usual metformin dose and his lunchtime and evening doses of 7525 since his blood sugars are well controlled this time. Repeat A1c is due in late September.   Updated Medication List Outpatient Encounter Prescriptions as of 05/26/2012  Medication Sig Dispense Refill  . amLODipine (NORVASC) 10 MG tablet TAKE ONE TABLET BY MOUTH DAILY  30 tablet  2  . atorvastatin (LIPITOR) 80 MG tablet Take 40 mg by mouth daily.      . carisoprodol (SOMA) 350 MG tablet Take 350 mg by mouth 2 (two) times daily as needed.      . fenofibrate micronized (LOFIBRA) 134 MG capsule TAKE 1 CAPSULE BY MOUTH DAILY  30 capsule  6  . glipiZIDE (GLUCOTROL) 10 MG tablet Take 1 tablet (10 mg total) by mouth 2 (two) times daily.  60 tablet  11  . glucose blood (BAYER CONTOUR TEST) test strip Check blood sugar three times a day.  100 each  5  . HYDROcodone-acetaminophen (VICODIN) 5-500 MG per tablet Take 1 tablet by mouth every 6 (six) hours as needed.      . insulin lispro protamine-insulin lispro (HUMALOG 75/25) (75-25) 100 UNIT/ML SUSP Inject 15 Units into the skin 3 (three) times daily.      . INSULIN SYRINGE .5CC/29G 29G X 1/2" 0.5 ML MISC 1 Syringe by Does not apply route 3 (three) times daily.  100 each  6  . losartan (COZAAR) 100 MG tablet TAKE 1 TABLET BY MOUTH EVERY DAY  30 tablet  5  . metFORMIN (GLUCOPHAGE) 1000 MG tablet Take 1,000 mg by mouth 2 (two) times daily with a meal.        . metoprolol succinate (TOPROL-XL) 100 MG 24 hr tablet TAKE ONE TABLET BY MOUTH DAILY  30 tablet  6  . zolpidem (AMBIEN) 5 MG tablet Take 1 tablet (5 mg total) by mouth at bedtime as needed for sleep.  30 tablet  1  . DISCONTD: insulin lispro protamine-insulin lispro (HUMALOG 75/25) (75-25) 100  UNIT/ML SUSP Inject 20 units in the am and 22 units in the pm before meals  10 mL  12     Orders Placed This Encounter  Procedures  . Ambulatory referral to Vascular Surgery  . HM DIABETES FOOT EXAM    No Follow-up on file.

## 2012-05-27 ENCOUNTER — Telehealth: Payer: Self-pay | Admitting: Internal Medicine

## 2012-05-27 NOTE — Telephone Encounter (Signed)
Ok, he will be contacted when the appt is made.

## 2012-05-27 NOTE — Telephone Encounter (Signed)
Patient says that his last colonoscopy was in 1998

## 2012-05-29 ENCOUNTER — Other Ambulatory Visit: Payer: Self-pay | Admitting: Internal Medicine

## 2012-06-11 ENCOUNTER — Other Ambulatory Visit: Payer: Self-pay | Admitting: Internal Medicine

## 2012-06-11 DIAGNOSIS — G4700 Insomnia, unspecified: Secondary | ICD-10-CM

## 2012-06-12 MED ORDER — ZOLPIDEM TARTRATE 5 MG PO TABS: 5.0000 mg | ORAL_TABLET | Freq: Every evening | ORAL | Status: DC | PRN

## 2012-06-15 ENCOUNTER — Other Ambulatory Visit: Payer: Self-pay | Admitting: Internal Medicine

## 2012-06-17 ENCOUNTER — Encounter: Payer: Self-pay | Admitting: Internal Medicine

## 2012-06-21 DIAGNOSIS — E1151 Type 2 diabetes mellitus with diabetic peripheral angiopathy without gangrene: Secondary | ICD-10-CM

## 2012-06-21 HISTORY — DX: Type 2 diabetes mellitus with diabetic peripheral angiopathy without gangrene: E11.51

## 2012-07-01 ENCOUNTER — Telehealth: Payer: Self-pay | Admitting: Internal Medicine

## 2012-07-01 DIAGNOSIS — I739 Peripheral vascular disease, unspecified: Secondary | ICD-10-CM | POA: Insufficient documentation

## 2012-07-01 NOTE — Telephone Encounter (Signed)
Mr. Henry Lindsey recent ultrasound of the lower extremities did confirm that he has a partial blockage on the right side which may be causing his leg pain he also has blockages on both legs on the lower leg. I would like him to see Dr. dew in consultation. I will arrange that appointment.

## 2012-07-01 NOTE — Assessment & Plan Note (Signed)
With recent ultrasound showing bilateral tibial occlusive disease and greater than 50% stenosis of the distal right superficial femoral artery. Referral to Dr. dew in process.

## 2012-07-02 NOTE — Telephone Encounter (Signed)
Patient notified.  He stated he has another appt with Dr. Wyn Quaker December 10th, and he stated him on cilostazol 100mg  BID.  Does patient need to see Dr. Wyn Quaker sooner than Dec 10th?

## 2012-07-02 NOTE — Telephone Encounter (Signed)
This was faxed

## 2012-07-02 NOTE — Telephone Encounter (Signed)
No,  I just couldn't tell whether he was set up to see Dew.

## 2012-07-09 ENCOUNTER — Encounter: Payer: Self-pay | Admitting: Internal Medicine

## 2012-07-09 ENCOUNTER — Ambulatory Visit: Payer: Self-pay | Admitting: Vascular Surgery

## 2012-07-09 LAB — CREATININE, SERUM
Creatinine: 1.26 mg/dL (ref 0.60–1.30)
EGFR (African American): >60

## 2012-07-14 ENCOUNTER — Ambulatory Visit (INDEPENDENT_AMBULATORY_CARE_PROVIDER_SITE_OTHER): Payer: 59 | Admitting: Internal Medicine

## 2012-07-14 ENCOUNTER — Encounter: Payer: Self-pay | Admitting: Internal Medicine

## 2012-07-14 VITALS — BP 140/78 | HR 66 | Temp 97.9°F | Resp 18 | Wt 193.5 lb

## 2012-07-14 DIAGNOSIS — Z01818 Encounter for other preprocedural examination: Secondary | ICD-10-CM

## 2012-07-14 DIAGNOSIS — I63239 Cerebral infarction due to unspecified occlusion or stenosis of unspecified carotid arteries: Secondary | ICD-10-CM | POA: Insufficient documentation

## 2012-07-14 DIAGNOSIS — R9431 Abnormal electrocardiogram [ECG] [EKG]: Secondary | ICD-10-CM

## 2012-07-14 DIAGNOSIS — M1712 Unilateral primary osteoarthritis, left knee: Secondary | ICD-10-CM

## 2012-07-14 DIAGNOSIS — M1710 Unilateral primary osteoarthritis, unspecified knee: Secondary | ICD-10-CM

## 2012-07-14 DIAGNOSIS — E1165 Type 2 diabetes mellitus with hyperglycemia: Secondary | ICD-10-CM

## 2012-07-14 DIAGNOSIS — E1151 Type 2 diabetes mellitus with diabetic peripheral angiopathy without gangrene: Secondary | ICD-10-CM | POA: Insufficient documentation

## 2012-07-14 MED ORDER — INSULIN LISPRO PROT & LISPRO (75-25 MIX) 100 UNIT/ML ~~LOC~~ SUSP: 15.0000 [IU] | Freq: Two times a day (BID) | SUBCUTANEOUS | Status: DC

## 2012-07-14 NOTE — Assessment & Plan Note (Signed)
He is awaiting preoperative clearance for TKR and is anxious to proceed. I am concerned about his perioperative risk given his known PAD with prior CVA.  His EKG is abnormal today, suggesting prior MI. I am referring to cardiology for preoperative evaluation.

## 2012-07-14 NOTE — Patient Instructions (Addendum)
I will review your EKG and history with cardiology and decide whether you need to have a stress test to clear you for your knee surgery  Please bring by or mail your recent blood sugar diary so I can make any changes at if needed to your insulin dosing.  We are rechecking your cholesterol and hemoglobin A1c today.

## 2012-07-14 NOTE — Progress Notes (Signed)
Patient ID: Henry Lindsey, male   DOB: 01-30-1933, 78 y.o.   MRN: [id number] Patient Active Problem List  Diagnosis  . Basal Cell Carcinoma Of Cheek  . Left knee DJD  . Hyperlipidemia  . Hypertension  . CVA (cerebral infarction)  . Diabetes mellitus type II, uncontrolled  . Stroke  . Bereavement due to life event  . Claudication of calf muscles  . PAD (peripheral artery disease)  . Peripheral vascular disease in diabetes mellitus  . Carotid artery stenosis    Subjective:  CC:   Chief Complaint  Patient presents with  . Follow-up    HPI:   Henry Lindsey a 78 y.o. male who presents for preoperative clearance for left total knee replacement to be done by Henry Lindsey. Since his last visit Henry Lindsey has been diagnosed with peripheral vascular disease and underwent a PTCA of the distal right superficial femoral artery with 6 mm angioplasty balloon. This was done on September 19 by Henry Lindsey. There was near occlusive stenosis on the right that was in the 95% range with good distal flowIn the popliteal artery. Angiogram also showed patent renal arteries and patent aorta and iliac segments with some mild non-flow-limiting stenosis. Patient notes that since he has had his procedure he has no more claudication symptoms in his legs. He continues to work out on a regular basis with walking and weight lifting and has had no episodes of chest pain. He's had no prior Cardiologic evaluation. He does have a history of a right thalamic lacunar infarct in February 2012. At that time carotid Dopplers noted less than 50% stenosis bilaterally.  He has a history of diabetes mellitus on insulin requiring with improving control based on today's evaluation. He states that using 7525 mixed insulin twice daily he sugars have been consistently in the 1:30 to 150 range. His last hemoglobin A1c was 7.8 in in mid June and he is due for repeat now.   Past Medical History  Diagnosis Date  . Diabetes mellitus   .  Neuromuscular disorder   . Hyperlipidemia   . Hypertension   . CVA (cerebral infarction) feb 2012    r thalamic lacunar  . Peripheral vascular disease in diabetes mellitus Sept 2013     95% occlusion s/p PTCA R SFA Lindsey Sept 2013  . Carotid artery stenosis Feb 2012    <50% bilaterally    History reviewed. No pertinent past surgical history.       The following portions of the patient's history were reviewed and updated as appropriate: Allergies, current medications, and problem list.    Review of Systems:   12 Pt  review of systems was negative except those addressed in the HPI,     History   Social History  . Marital Status: Married    Spouse Name: N/A    Number of Children: N/A  . Years of Education: N/A   Occupational History  . Not on file.   Social History Main Topics  . Smoking status: Former Smoker    Quit date: 04/21/1968  . Smokeless tobacco: Never Used   Comment: quit 1969  . Alcohol Use: No  . Drug Use: No  . Sexually Active: Not on file   Other Topics Concern  . Not on file   Social History Narrative  . No narrative on file    Objective:  BP 140/78  Pulse 66  Temp 97.9 F (36.6 C) (Oral)  Resp 18  Wt 193 lb  8 oz (87.771 kg)  SpO2 97%  General appearance: alert, cooperative and appears stated age Ears: normal TM's and external ear canals both ears Throat: lips, mucosa, and tongue normal; teeth and gums normal Neck: no adenopathy, no carotid bruit, supple, symmetrical, trachea midline and thyroid not enlarged, symmetric, no tenderness/mass/nodules Back: symmetric, no curvature. ROM normal. No CVA tenderness. Lungs: clear to auscultation bilaterally Heart: regular rate and rhythm, S1, S2 normal, no murmur, click, rub or gallop Abdomen: soft, non-tender; bowel sounds normal; no masses,  no organomegaly Pulses: 2+ and symmetric Skin: Skin color, texture, turgor normal. No rashes or lesions Lymph nodes: Cervical, supraclavicular, and  axillary nodes normal.  Assessment and Plan:  Left knee DJD He is awaiting preoperative clearance for TKR and is anxious to proceed. I am concerned about his perioperative risk given his known PAD with prior CVA.  His EKG is abnormal today, suggesting prior MI. I am referring to cardiology for preoperative evaluation.     Updated Medication List Outpatient Encounter Prescriptions as of 07/14/2012  Medication Sig Dispense Refill  . amLODipine (NORVASC) 10 MG tablet TAKE ONE TABLET BY MOUTH DAILY  30 tablet  6  . atorvastatin (LIPITOR) 80 MG tablet Take 40 mg by mouth daily.      . carisoprodol (SOMA) 350 MG tablet Take 350 mg by mouth 2 (two) times daily as needed.      . cilostazol (PLETAL) 100 MG tablet Take 100 mg by mouth 2 (two) times daily.      . fenofibrate micronized (LOFIBRA) 134 MG capsule TAKE 1 CAPSULE BY MOUTH DAILY  30 capsule  6  . glipiZIDE (GLUCOTROL) 10 MG tablet Take 1 tablet (10 mg total) by mouth 2 (two) times daily.  60 tablet  11  . glucose blood (BAYER CONTOUR TEST) test strip Check blood sugar three times a day.  100 each  5  . HYDROcodone-acetaminophen (VICODIN) 5-500 MG per tablet Take 1 tablet by mouth every 6 (six) hours as needed.      . insulin lispro protamine-insulin lispro (HUMALOG 75/25) (75-25) 100 UNIT/ML SUSP Inject 15 Units into the skin 2 (two) times daily with a meal.  15 mL  11  . INSULIN SYRINGE .5CC/29G 29G X 1/2" 0.5 ML MISC 1 Syringe by Does not apply route 3 (three) times daily.  100 each  6  . losartan (COZAAR) 100 MG tablet TAKE 1 TABLET BY MOUTH EVERY DAY  30 tablet  5  . metFORMIN (GLUCOPHAGE) 1000 MG tablet TAKE 1 TABLET BY MOUTH TWICE DAILY  180 tablet  2  . metoprolol succinate (TOPROL-XL) 100 MG 24 hr tablet TAKE ONE TABLET BY MOUTH DAILY  30 tablet  6  . zolpidem (AMBIEN) 5 MG tablet Take 1 tablet (5 mg total) by mouth at bedtime as needed for sleep.  30 tablet  1  . DISCONTD: insulin lispro protamine-insulin lispro (HUMALOG 75/25)  (75-25) 100 UNIT/ML SUSP Inject 15 Units into the skin 3 (three) times daily.         Orders Placed This Encounter  Procedures  . Hemoglobin A1c  . Comprehensive metabolic panel  . Lipid panel  . LDL cholesterol, direct  . Ambulatory referral to Cardiology  . EKG 12-Lead    No Follow-up on file.

## 2012-07-15 LAB — LIPID PANEL
HDL: 38.6 mg/dL — ABNORMAL LOW (ref >39.00)
Total CHOL/HDL Ratio: 3
Triglycerides: 207 mg/dL — ABNORMAL HIGH (ref 0.0–149.0)
VLDL: 41.4 mg/dL — ABNORMAL HIGH (ref 0.0–40.0)

## 2012-07-15 LAB — COMPREHENSIVE METABOLIC PANEL
AST: 21 U/L (ref 0–37)
Albumin: 4.5 g/dL (ref 3.5–5.2)
Alkaline Phosphatase: 43 U/L (ref 39–117)
BUN: 21 mg/dL (ref 6–23)
Potassium: 4.7 mEq/L (ref 3.5–5.1)
Total Bilirubin: 0.6 mg/dL (ref 0.3–1.2)

## 2012-07-15 LAB — LDL CHOLESTEROL, DIRECT: Direct LDL: 55.2 mg/dL

## 2012-07-20 ENCOUNTER — Telehealth: Payer: Self-pay | Admitting: Internal Medicine

## 2012-07-20 NOTE — Telephone Encounter (Signed)
Opened in error

## 2012-07-21 ENCOUNTER — Ambulatory Visit (INDEPENDENT_AMBULATORY_CARE_PROVIDER_SITE_OTHER): Payer: 59 | Admitting: Cardiovascular Disease

## 2012-07-21 ENCOUNTER — Encounter: Payer: Self-pay | Admitting: Cardiovascular Disease

## 2012-07-21 VITALS — BP 138/70 | HR 68 | Ht 71.0 in | Wt 195.5 lb

## 2012-07-21 DIAGNOSIS — E785 Hyperlipidemia, unspecified: Secondary | ICD-10-CM

## 2012-07-21 DIAGNOSIS — I10 Essential (primary) hypertension: Secondary | ICD-10-CM

## 2012-07-21 DIAGNOSIS — I6350 Cerebral infarction due to unspecified occlusion or stenosis of unspecified cerebral artery: Secondary | ICD-10-CM

## 2012-07-21 DIAGNOSIS — I2510 Atherosclerotic heart disease of native coronary artery without angina pectoris: Secondary | ICD-10-CM

## 2012-07-21 DIAGNOSIS — I639 Cerebral infarction, unspecified: Secondary | ICD-10-CM

## 2012-07-21 DIAGNOSIS — I6529 Occlusion and stenosis of unspecified carotid artery: Secondary | ICD-10-CM

## 2012-07-21 DIAGNOSIS — IMO0002 Reserved for concepts with insufficient information to code with codable children: Secondary | ICD-10-CM

## 2012-07-21 DIAGNOSIS — I739 Peripheral vascular disease, unspecified: Secondary | ICD-10-CM

## 2012-07-21 DIAGNOSIS — E1165 Type 2 diabetes mellitus with hyperglycemia: Secondary | ICD-10-CM

## 2012-07-21 NOTE — Assessment & Plan Note (Signed)
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 Given the extent of his peripheral vascular disease, this is very likely. He is unable to exert himself secondary to claudication and left knee chronic pain. He is preop for surgery. We will schedule him for a pharmacologic Myoview.

## 2012-07-21 NOTE — Assessment & Plan Note (Signed)
Diabetes is improving under the guidance of Dr. Darrick Huntsman. We have discussed with him that his goal hemoglobin A1c is in the 6.5 range

## 2012-07-21 NOTE — Assessment & Plan Note (Signed)
Recent angioplasty of the right distal SFA. Moderate disease on the left as well as bilaterally distally with occlusive tibial disease.

## 2012-07-21 NOTE — Assessment & Plan Note (Signed)
Cholesterol is at goal on the current lipid regimen. No changes to the medications were made.  

## 2012-07-21 NOTE — Patient Instructions (Addendum)
You are doing well. No medication changes were made.  We will schedule you for a lexiscan myoview at [facility] No caffeine 24 hours before the test Please hold your amlodipine and metoprolol the day before and day of the test Hold your diabetes medications the morning of the test No breakfast the morning of the test  Please call us if you have new issues that need to be addressed before your next appt.  Your physician wants you to follow-up in: 12 months.  You will receive a reminder letter in the mail two months in advance. If you don't receive a letter, please call our office to schedule the follow-up appointment.

## 2012-07-21 NOTE — Progress Notes (Signed)
Patient ID: Henry Lindsey, male    DOB: [birth year], 78 y.o.   MRN: [id number]  HPI Comments: Henry Lindsey is a 78 y.o. male with a history of hyperlipidemia, diabetes, peripheral vascular disease with recent angioplasty of the distal right SFA by Dr.  Wyn Quaker, 50% carotid disease bilaterally in 2012, history of poorly controlled diabetes with most recent hemoglobin A1c 7.2  (improving),  With no prior cardiac workup apart from stress test 8 years ago, also with history of stroke with minimal residual left sided deficits, who presents by referral from Dr. Darrick Huntsman for preoperative evaluation prior to left total knee replacement, with abnormal EKG.  EKG done in Dr.Tullo's office showed normal sinus rhythm with old inferior infarct . He denies any previous episode of chest pain or shortness of breath . He's unable to exercise to any significant degree secondary to his chronic left knee pain. Recent claudication type symptoms have improved particularly on the right side after angioplasty . Previous ultrasound showed bilateral tibial occlusive disease, residual 50% left SFA stenosis .   He does have a history of a right thalamic lacunar infarct in February 2012. Prior  carotid Dopplers noted less than 50% stenosis bilaterally.  No new EKG done today at his request    Outpatient Encounter Prescriptions as of 07/21/2012  Medication Sig Dispense Refill  . amLODipine (NORVASC) 10 MG tablet TAKE ONE TABLET BY MOUTH DAILY  30 tablet  6  . atorvastatin (LIPITOR) 80 MG tablet Take 40 mg by mouth daily.      . carisoprodol (SOMA) 350 MG tablet Take 350 mg by mouth 2 (two) times daily as needed.      . cilostazol (PLETAL) 100 MG tablet Take 100 mg by mouth 2 (two) times daily.      . fenofibrate micronized (LOFIBRA) 134 MG capsule TAKE 1 CAPSULE BY MOUTH DAILY  30 capsule  6  . glipiZIDE (GLUCOTROL) 10 MG tablet Take 1 tablet (10 mg total) by mouth 2 (two) times daily.  60 tablet  11  . glucose blood (BAYER CONTOUR  TEST) test strip Check blood sugar three times a day.  100 each  5  . HYDROcodone-acetaminophen (VICODIN) 5-500 MG per tablet Take 1 tablet by mouth every 6 (six) hours as needed.      . insulin lispro protamine-insulin lispro (HUMALOG 75/25) (75-25) 100 UNIT/ML SUSP Inject 15 Units into the skin 2 (two) times daily with a meal.  15 mL  11  . INSULIN SYRINGE .5CC/29G 29G X 1/2" 0.5 ML MISC 1 Syringe by Does not apply route 3 (three) times daily.  100 each  6  . losartan (COZAAR) 100 MG tablet TAKE 1 TABLET BY MOUTH EVERY DAY  30 tablet  5  . metFORMIN (GLUCOPHAGE) 1000 MG tablet TAKE 1 TABLET BY MOUTH TWICE DAILY  180 tablet  2  . metoprolol succinate (TOPROL-XL) 100 MG 24 hr tablet TAKE ONE TABLET BY MOUTH DAILY  30 tablet  6  . zolpidem (AMBIEN) 5 MG tablet Take 1 tablet (5 mg total) by mouth at bedtime as needed for sleep.  30 tablet  1     Review of Systems  Constitutional: Negative.   HENT: Negative.   Eyes: Negative.   Respiratory: Negative.   Cardiovascular: Negative.   Gastrointestinal: Negative.   Musculoskeletal: Positive for myalgias, arthralgias and gait problem.  Skin: Negative.   Neurological: Negative.   Hematological: Negative.   Psychiatric/Behavioral: Negative.   All other systems reviewed and  are negative.    BP 138/70  Pulse 68  Ht 5\' 11"  (1.803 m)  Wt 195 lb 8 oz (88.678 kg)  BMI 27.27 kg/m2  Physical Exam  Nursing note and vitals reviewed. Constitutional: He is oriented to person, place, and time. He appears well-developed and well-nourished.  HENT:  Head: Normocephalic.  Nose: Nose normal.  Mouth/Throat: Oropharynx is clear and moist.  Eyes: Conjunctivae normal are normal. Pupils are equal, round, and reactive to light.  Neck: Normal range of motion. Neck supple. No JVD present. Carotid bruit is present.  Cardiovascular: Normal rate, regular rhythm, S1 normal, S2 normal, normal heart sounds and intact distal pulses.  Exam reveals no gallop and no  friction rub.   No murmur heard. Pulses:      Carotid pulses are 1+ on the right side, and 1+ on the left side.      Radial pulses are 2+ on the right side, and 2+ on the left side.       Dorsalis pedis pulses are 0 on the right side, and 0 on the left side.       Posterior tibial pulses are 0 on the right side, and 0 on the left side.  Pulmonary/Chest: Effort normal and breath sounds normal. No respiratory distress. He has no wheezes. He has no rales. He exhibits no tenderness.  Abdominal: Soft. Bowel sounds are normal. He exhibits no distension. There is no tenderness.  Musculoskeletal: Normal range of motion. He exhibits no edema and no tenderness.  Lymphadenopathy:    He has no cervical adenopathy.  Neurological: He is alert and oriented to person, place, and time. Coordination normal.  Skin: Skin is warm and dry. No rash noted. No erythema.  Psychiatric: He has a normal mood and affect. His behavior is normal. Judgment and thought content normal.           Assessment and Plan

## 2012-07-21 NOTE — Assessment & Plan Note (Signed)
We have suggested he stay on his aspirin and Pletal. Aggressive cholesterol and diabetes control.

## 2012-07-21 NOTE — Assessment & Plan Note (Signed)
Blood pressure is well controlled on today's visit. No changes made to the medications. 

## 2012-07-21 NOTE — Assessment & Plan Note (Signed)
Previous ultrasound 2012 by report showing moderate bilateral disease. Continue aggressive lipid management , diabetes control

## 2012-07-23 ENCOUNTER — Telehealth: Payer: Self-pay | Admitting: Internal Medicine

## 2012-07-23 ENCOUNTER — Other Ambulatory Visit: Payer: Self-pay

## 2012-07-23 DIAGNOSIS — R079 Chest pain, unspecified: Secondary | ICD-10-CM

## 2012-07-23 NOTE — Telephone Encounter (Signed)
Patient is needing medical clearance for total left knee replacement.

## 2012-07-24 ENCOUNTER — Ambulatory Visit: Payer: Self-pay | Admitting: Cardiovascular Disease

## 2012-07-24 ENCOUNTER — Telehealth: Payer: Self-pay

## 2012-07-24 DIAGNOSIS — R079 Chest pain, unspecified: Secondary | ICD-10-CM

## 2012-07-24 NOTE — Telephone Encounter (Signed)
Pt informed.  Understanding verb. Will fax cardiac clearance letter to Dr. Katrinka Blazing

## 2012-07-24 NOTE — Telephone Encounter (Signed)
"  Call patient and tell him stress test showed old inferior MI. No signs of new damage or blockage. Patient is at acceptable risk for surgery and may procede."V.O Dr. Alvis Lemmings, RN

## 2012-07-30 NOTE — Telephone Encounter (Signed)
Message left at [facility] for [facility].

## 2012-07-30 NOTE — Telephone Encounter (Signed)
RC from [place] at [facility].  She states they have surgical clearance from Dr. Lewie Loron and do not need further clearance from our office. Follow up ended.

## 2012-07-31 ENCOUNTER — Other Ambulatory Visit: Payer: Self-pay | Admitting: Internal Medicine

## 2012-08-10 ENCOUNTER — Other Ambulatory Visit: Payer: Self-pay | Admitting: Internal Medicine

## 2012-08-10 ENCOUNTER — Telehealth: Payer: Self-pay | Admitting: Internal Medicine

## 2012-08-10 ENCOUNTER — Telehealth: Payer: Self-pay | Admitting: *Deleted

## 2012-08-10 DIAGNOSIS — G4700 Insomnia, unspecified: Secondary | ICD-10-CM

## 2012-08-10 MED ORDER — ZOLPIDEM TARTRATE 5 MG PO TABS: 5.0000 mg | ORAL_TABLET | Freq: Every evening | ORAL | Status: DC | PRN

## 2012-08-10 MED ORDER — INSULIN LISPRO PROT & LISPRO (75-25 MIX) 100 UNIT/ML ~~LOC~~ SUSP: SUBCUTANEOUS | Status: DC

## 2012-08-10 NOTE — Telephone Encounter (Signed)
Ok to refill zolpidem,  Authorized in epic  Blood pressures are fine,.  Blood sugars are a little elevated in PM.  Ask him to increase evening dose of insulin to 17 units,  continue 15 in the morning.

## 2012-08-10 NOTE — Telephone Encounter (Signed)
R'cd fax from [facility] for refill of Zolpidem-last written 06/11/2012 #30 with 1 refill-please advise.

## 2012-08-10 NOTE — Telephone Encounter (Signed)
Patient notified of the change with his insulin medication.

## 2012-08-10 NOTE — Telephone Encounter (Signed)
Pt dropped off B/P readings   07/18/12- 124/69 Pulse 71 08/01/12- 124/79 Pulse 69 08/05/12- 134/69 Pulse 68 08/08/12- 137/67 Pulse 69  Sugar readings 08/05/12 2:30pm 212 08/07/12 1:16pm 144 08/08/12 8:20 pm 179 08/09/12 9:14 am 134 08/10/12 10:30 am 147

## 2012-08-10 NOTE — Telephone Encounter (Signed)
Called in Rx Ambien 5 mg # 30 2 R to [facility].

## 2012-08-17 ENCOUNTER — Other Ambulatory Visit: Payer: Self-pay | Admitting: Internal Medicine

## 2012-08-17 ENCOUNTER — Ambulatory Visit: Payer: Self-pay | Admitting: Specialist

## 2012-08-17 LAB — URINALYSIS, COMPLETE
Bacteria: NONE SEEN
Blood: NEGATIVE
Ketone: NEGATIVE
Leukocyte Esterase: NEGATIVE
Ph: 8 (ref 4.5–8.0)
Protein: NEGATIVE
RBC,UR: 3 /HPF (ref 0–5)
Specific Gravity: 1.017 (ref 1.003–1.030)
Squamous Epithelial: NONE SEEN
WBC UR: 5 /HPF (ref 0–5)

## 2012-08-17 LAB — MRSA PCR SCREENING

## 2012-08-17 LAB — BASIC METABOLIC PANEL
Anion Gap: 8 (ref 7–16)
BUN: 16 mg/dL (ref 7–18)
Creatinine: 1.21 mg/dL (ref 0.60–1.30)
EGFR (African American): >60
EGFR (Non-African Amer.): 57 — ABNORMAL LOW

## 2012-08-17 LAB — CBC
HCT: 42.1 % (ref 40.0–52.0)
HGB: 14.4 g/dL (ref 13.0–18.0)
MCH: 30.1 pg (ref 26.0–34.0)
MCHC: 34.1 g/dL (ref 32.0–36.0)
MCV: 88 fL (ref 80–100)
Platelet: 321 10*3/uL (ref 150–440)
RBC: 4.77 10*6/uL (ref 4.40–5.90)
RDW: 13.4 % (ref 11.5–14.5)

## 2012-08-17 LAB — PROTIME-INR: INR: 1

## 2012-08-17 LAB — APTT: Activated PTT: 30.9 secs (ref 23.6–35.9)

## 2012-08-18 ENCOUNTER — Other Ambulatory Visit: Payer: Self-pay

## 2012-08-18 NOTE — Telephone Encounter (Signed)
Refill request for Losartan 100 mg #30 0 R sent to [facility] pharmacy

## 2012-08-26 ENCOUNTER — Inpatient Hospital Stay: Payer: Self-pay | Admitting: Specialist

## 2012-08-27 LAB — BASIC METABOLIC PANEL
BUN: 20 mg/dL — ABNORMAL HIGH (ref 7–18)
Creatinine: 1.07 mg/dL (ref 0.60–1.30)
EGFR (African American): >60
EGFR (Non-African Amer.): >60
Osmolality: 277 (ref 275–301)
Sodium: 136 mmol/L (ref 136–145)

## 2012-09-01 ENCOUNTER — Ambulatory Visit: Payer: Medicare Other | Admitting: Internal Medicine

## 2012-09-01 ENCOUNTER — Telehealth: Payer: Self-pay | Admitting: Internal Medicine

## 2012-09-01 NOTE — Telephone Encounter (Signed)
He needs to be seen; Dr Lorin Picket will see him today.  plesae schedule

## 2012-09-01 NOTE — Telephone Encounter (Signed)
Pt's daughter came by office concerning her father's heart rate which is 130 resting. He is needing blood sugar strips he is completely out and is needing that as soon as possible. The physical therapist that si coming out told her she should come up here and let someone know what was going on. I talked to Bjorn Loser she told me to send you a urgent note on pt so you could evaluate the situation quickly. They were also wondering if an [facility] nurse could start coming out to help with pt's meds. They took him off his regular meds before his knee surgery which was 08/26/12 he has not taken any of his regular meds since and the family was not sure how to start the patient back up wit those and how they would react with his hydrocodone and Asprin. The daughter was wanting a call from someone on what to do and be advised on what she should do. Her name is Candace [phone].

## 2012-09-02 ENCOUNTER — Telehealth: Payer: Self-pay | Admitting: Internal Medicine

## 2012-09-02 NOTE — Telephone Encounter (Signed)
I talked to Henry Lindsey (Henry Lindsey's daughter) 09/01/12 regarding the earlier phone message.  She reported that Henry Lindsey had knee replacement surgery 08/26/12 and since that time has been "off his regular medications".  Was discharged two days ago.  Home physical therapy was ordered.  Therapist came out today and noticed he had an increased heart rate - 130.  Initially thought was related to therapy, but even after resting - his heart rate was still elevated.  Pt was described as clammy and minimally disoriented.  He also described some intermittent chest tightness.  Daughter confirmed that he has not been taking any of his regular medications (heart meds, etc).  He did start taking his insulin.  Has no test strips.  I discussed at length with the daughter and the patient the urgent need for further evaluation.  Given his symptoms and clinical description, I explained that he needed to be evaluated in the ER.  (Explained to them that Henry Lindsey agreed with this recommendation).  Explained the need to confirm or rule out active ischemia, atrial fib, pulmonary embolus, etc.  He refused evaluation in the ER stating that he understood my concern, but felt he did not need to be evaluated at this time.  Stated he felt better and felt "he just needed to get back on his regular medications".  I then explained to the patient and his daughter that if he was not going to theER, then he at least needed to keep the appt here and let me evaluate him.  He refused to come in to the office today - despite repeated explanations of the need to rule out the above.  He continued to report - he felt better and did not think he needed an evaluation today.  (Daughter reported current heart rate 99 and blood pressure in the 130s).  The daughter informed me that he has all of his medications - he just has not been taking them.  Plans to start back on them now.  She stated the only thing they needed were test strips and desired nursing visits be  added - to the home health services.  I gave the information to [facility] to call in the test strips.  He needs an order for home health nurse.  (has home health PT in place now).  Also, the daughter request a follow up appt with you - possibly later this week.  The daughter plans to stay with Henry Lindsey.  I explained to both of them again the need for evaluation.  Continued to decline.  I did inform them if he had any change tonight - he needed evaluation immediately.

## 2012-09-03 ENCOUNTER — Other Ambulatory Visit: Payer: Self-pay

## 2012-09-03 MED ORDER — GLUCOSE BLOOD VI STRP: ORAL_STRIP | Status: AC

## 2012-09-03 NOTE — Telephone Encounter (Signed)
Refill request for Contour Test strips ok to refill

## 2012-09-03 NOTE — Telephone Encounter (Signed)
Refill request for Contour test strips sent electronic to [facility] pharmacy.

## 2012-09-03 NOTE — Telephone Encounter (Signed)
Per daughter they are using [facility] for patient for physical therapy and  she is requesting a nurse. They need a verbal order.

## 2012-09-04 MED ORDER — GLUCOSE BLOOD VI STRP: ORAL_STRIP | Status: DC

## 2012-09-04 NOTE — Telephone Encounter (Signed)
Called number in patient chart to get info for nurse to give verbal order. I was unable to reach anybody or leave a message because the recording said please enter your code. Will attempt to call back later.

## 2012-09-04 NOTE — Telephone Encounter (Signed)
[facility] request for nurse for home care authorized.

## 2012-09-04 NOTE — Addendum Note (Signed)
Addended by: Sherlene Shams on: 09/04/2012 11:41 AM   Modules accepted: Orders

## 2012-09-05 ENCOUNTER — Other Ambulatory Visit: Payer: Self-pay | Admitting: Internal Medicine

## 2012-09-07 NOTE — Telephone Encounter (Signed)
Reordered patients Glipizide per Epic

## 2012-09-08 ENCOUNTER — Other Ambulatory Visit: Payer: Self-pay

## 2012-09-08 ENCOUNTER — Other Ambulatory Visit: Payer: Self-pay | Admitting: Internal Medicine

## 2012-09-21 ENCOUNTER — Other Ambulatory Visit: Payer: Self-pay | Admitting: Internal Medicine

## 2012-09-22 ENCOUNTER — Other Ambulatory Visit: Payer: Self-pay

## 2012-09-22 MED ORDER — FENOFIBRATE MICRONIZED 134 MG PO CAPS: 134.0000 mg | ORAL_CAPSULE | Freq: Every day | ORAL | Status: DC

## 2012-09-22 MED ORDER — LOSARTAN POTASSIUM 100 MG PO TABS: 100.0000 mg | ORAL_TABLET | Freq: Every day | ORAL | Status: DC

## 2012-09-22 NOTE — Telephone Encounter (Signed)
Refill request for Fenofibrate 134 mg # 30 6 R and Losartan 100 mg # 30 0 R sent electronic to [facility]

## 2012-09-23 ENCOUNTER — Other Ambulatory Visit: Payer: Self-pay | Admitting: Internal Medicine

## 2012-09-23 NOTE — Telephone Encounter (Signed)
Pt's daughter is calling and he only has 1 day left of his insulin and she was wanting to know if his insulin could be called into [facility] on [address] And she was wondering if he could get samples of the insulin to last him until he gets the rx from the drug store.

## 2012-09-24 NOTE — Telephone Encounter (Signed)
Humalog insulin was filled on 08/10/12 with 11 R sent to [facility] pharmacy

## 2012-09-25 ENCOUNTER — Other Ambulatory Visit: Payer: Self-pay | Admitting: Internal Medicine

## 2012-09-25 ENCOUNTER — Other Ambulatory Visit: Payer: Self-pay | Admitting: *Deleted

## 2012-09-25 MED ORDER — INSULIN LISPRO PROT & LISPRO (75-25 MIX) 100 UNIT/ML ~~LOC~~ SUSP: SUBCUTANEOUS | Status: DC

## 2012-09-25 NOTE — Telephone Encounter (Signed)
Patient came to office to check on his prescription for Humalog. Gave patient samples and also called pharmacy to make sure they had received prescription.

## 2012-10-15 ENCOUNTER — Other Ambulatory Visit: Payer: Self-pay | Admitting: Internal Medicine

## 2012-10-15 NOTE — Telephone Encounter (Signed)
Med filled.  

## 2012-10-21 HISTORY — PX: JOINT REPLACEMENT: SHX530

## 2012-11-04 ENCOUNTER — Other Ambulatory Visit: Payer: Self-pay | Admitting: Internal Medicine

## 2012-11-04 NOTE — Telephone Encounter (Signed)
Vicodin refill request. Pt last seen on 9/24 and med last filled on 11/19 #90 with 1 refill.

## 2012-11-05 ENCOUNTER — Other Ambulatory Visit: Payer: Self-pay | Admitting: General Practice

## 2012-11-05 MED ORDER — HYDROCODONE-ACETAMINOPHEN 5-325 MG PO TABS: 1.0000 | ORAL_TABLET | Freq: Four times a day (QID) | ORAL | Status: DC | PRN

## 2012-11-06 ENCOUNTER — Other Ambulatory Visit: Payer: Self-pay | Admitting: Internal Medicine

## 2012-11-06 NOTE — Telephone Encounter (Signed)
Ok to refill, with changes made  rx printed

## 2012-11-06 NOTE — Telephone Encounter (Signed)
Pt seen on 9/24. Ambien last filled on 10/21. Ok to fill?

## 2012-11-12 ENCOUNTER — Other Ambulatory Visit: Payer: Self-pay | Admitting: Internal Medicine

## 2012-11-12 MED ORDER — HYDROCODONE-ACETAMINOPHEN 5-325 MG PO TABS: 1.0000 | ORAL_TABLET | Freq: Four times a day (QID) | ORAL | Status: DC | PRN

## 2012-11-14 ENCOUNTER — Other Ambulatory Visit: Payer: Self-pay | Admitting: Internal Medicine

## 2012-11-23 ENCOUNTER — Other Ambulatory Visit: Payer: Self-pay | Admitting: Internal Medicine

## 2012-11-24 ENCOUNTER — Other Ambulatory Visit: Payer: Self-pay | Admitting: General Practice

## 2012-11-24 MED ORDER — HYDROCODONE-ACETAMINOPHEN 5-325 MG PO TABS: 1.0000 | ORAL_TABLET | Freq: Four times a day (QID) | ORAL | Status: DC | PRN

## 2012-11-24 NOTE — Telephone Encounter (Signed)
Dr. Darrick Huntsman had printed on 1/23. Pt states he did not receive. Reprinted per Dr. Melina Schools original instructions.

## 2012-12-08 ENCOUNTER — Telehealth: Payer: Self-pay | Admitting: Internal Medicine

## 2012-12-08 NOTE — Telephone Encounter (Signed)
Pt also dropped off forms for the prior Auth they were put up front in Dr. Melina Schools box

## 2012-12-08 NOTE — Telephone Encounter (Signed)
Zolpidem 5 mg is needing prior Auth.

## 2012-12-09 ENCOUNTER — Telehealth: Payer: Self-pay | Admitting: *Deleted

## 2012-12-09 NOTE — Telephone Encounter (Signed)
Called [phone] for prior authorization on the Zolpidem 5 mg, it was covered under the patients plan

## 2012-12-09 NOTE — Telephone Encounter (Signed)
PA form given to [place] to begin process.

## 2012-12-24 ENCOUNTER — Telehealth: Payer: Self-pay | Admitting: *Deleted

## 2012-12-24 NOTE — Telephone Encounter (Signed)
Yes, it was approved on 2.19.2014 it was covered under the pt plan

## 2012-12-24 NOTE — Telephone Encounter (Signed)
Noted thanks °

## 2012-12-24 NOTE — Telephone Encounter (Signed)
Do we know the status of this PA?

## 2012-12-28 ENCOUNTER — Other Ambulatory Visit: Payer: Self-pay | Admitting: Internal Medicine

## 2012-12-28 NOTE — Telephone Encounter (Signed)
  Received refill electronically. See warning. Is it okay to refill medication?

## 2013-01-19 ENCOUNTER — Other Ambulatory Visit: Payer: Self-pay | Admitting: Internal Medicine

## 2013-01-19 NOTE — Telephone Encounter (Signed)
Eprescribed.

## 2013-01-26 ENCOUNTER — Other Ambulatory Visit: Payer: Self-pay | Admitting: Internal Medicine

## 2013-01-26 NOTE — Telephone Encounter (Signed)
Med filled.  

## 2013-02-02 ENCOUNTER — Telehealth: Payer: Self-pay | Admitting: Internal Medicine

## 2013-02-02 NOTE — Telephone Encounter (Signed)
Tried reaching patient for second time with no answer and no way to leave message

## 2013-02-02 NOTE — Telephone Encounter (Signed)
Pt came in wanting to see you have sample of humalog mix 75/25 pen he is going out of town and wants a sample if possible

## 2013-02-02 NOTE — Telephone Encounter (Signed)
If we have a 75/25 pen to give him ,  Please do so

## 2013-02-02 NOTE — Telephone Encounter (Signed)
Patient wanting sample of Humalog

## 2013-02-02 NOTE — Telephone Encounter (Signed)
Tried to reach patient at home will try and call later to verify medication as which sample he would like.

## 2013-02-03 NOTE — Telephone Encounter (Signed)
Patient notified sample left up front.

## 2013-02-11 ENCOUNTER — Other Ambulatory Visit: Payer: Self-pay | Admitting: Internal Medicine

## 2013-02-12 ENCOUNTER — Other Ambulatory Visit: Payer: Self-pay | Admitting: Internal Medicine

## 2013-02-12 NOTE — Telephone Encounter (Signed)
Refill one 30 days only.  Has not been seen in 6 month s andd is overdue for DM follow up he needs a  30 minute visit.

## 2013-02-15 ENCOUNTER — Ambulatory Visit (INDEPENDENT_AMBULATORY_CARE_PROVIDER_SITE_OTHER): Payer: Medicare Other | Admitting: Internal Medicine

## 2013-02-15 ENCOUNTER — Encounter: Payer: Self-pay | Admitting: Internal Medicine

## 2013-02-15 VITALS — BP 140/78 | HR 67 | Temp 97.8°F | Resp 16 | Ht 70.0 in | Wt 191.5 lb

## 2013-02-15 DIAGNOSIS — I798 Other disorders of arteries, arterioles and capillaries in diseases classified elsewhere: Secondary | ICD-10-CM

## 2013-02-15 DIAGNOSIS — M1712 Unilateral primary osteoarthritis, left knee: Secondary | ICD-10-CM

## 2013-02-15 DIAGNOSIS — I10 Essential (primary) hypertension: Secondary | ICD-10-CM

## 2013-02-15 DIAGNOSIS — E785 Hyperlipidemia, unspecified: Secondary | ICD-10-CM

## 2013-02-15 DIAGNOSIS — E1151 Type 2 diabetes mellitus with diabetic peripheral angiopathy without gangrene: Secondary | ICD-10-CM

## 2013-02-15 DIAGNOSIS — E1165 Type 2 diabetes mellitus with hyperglycemia: Secondary | ICD-10-CM

## 2013-02-15 DIAGNOSIS — IMO0002 Reserved for concepts with insufficient information to code with codable children: Secondary | ICD-10-CM

## 2013-02-15 DIAGNOSIS — M1710 Unilateral primary osteoarthritis, unspecified knee: Secondary | ICD-10-CM

## 2013-02-15 DIAGNOSIS — IMO0001 Reserved for inherently not codable concepts without codable children: Secondary | ICD-10-CM

## 2013-02-15 MED ORDER — SILDENAFIL CITRATE 50 MG PO TABS: 50.0000 mg | ORAL_TABLET | Freq: Every day | ORAL | Status: DC | PRN

## 2013-02-15 MED ORDER — ZOLPIDEM TARTRATE 5 MG PO TABS: ORAL_TABLET | ORAL | Status: DC

## 2013-02-15 NOTE — Patient Instructions (Addendum)
You should TAKE YOUR 75/25  Insulin before breakfast and before dinner.  If you take it before lunch,   they will overlap and cause more low blood sugars   Your feet are in good condition   I sent a prescription for Viagra to [facility]  If it is too $$$$$,  Try the natural supplement Gingko .  It is reported to improve blood flow to the sexual organs   Return in 6 weeks for your annual physical .

## 2013-02-15 NOTE — Progress Notes (Signed)
Patient ID: [name], male   DOB: 01-18-1933, 79 y.o.   MRN: [id number]   Patient Active Problem List   Diagnosis Date Noted  . CAD (coronary artery disease) 07/21/2012  . Peripheral vascular disease in diabetes mellitus   . Carotid artery stenosis   . PAD (peripheral artery disease) 07/01/2012  . Claudication of calf muscles 05/26/2012  . Bereavement due to life event 04/23/2012  . Basal Cell Carcinoma Of Cheek 08/05/2011  . 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 Diabetes mellitus type II, uncontrolled 08/05/2011  . Stroke 08/05/2011  . Hyperlipidemia   . Hypertension   . CVA (cerebral infarction)     Subjective:  CC:   Chief Complaint  Patient presents with  . Annual Exam    HPI:   Henry Lindsey a 79 y.o. male who presents Here for annual exam but has been lost to follow up on diabetes since September .  Wellness exam postponed   He underwent a Left  Knee replacement on Nov 6th,  And continues to have persisytent pain and stiffness, requiring daily use of analgesics .  No longer doing PT.   His blood sugars are not recorded,  He recalls that his fastings have ranged from 76 to 176.  He had One low sugar a t 2 am. He is taking his 70/30 insulin incorrectly,  Using 15 units before lunch and before dinner .  LaST a1C was 7.1 in September .    He underwent a PTCA of the distal right superficial femoral artery with 6 mm angioplasty balloon. This was done on September 19 by Dr. dew. There was near occlusive stenosis on the right that was in the 95% range with good distal flowIn the popliteal artery. Angiogram also showed patent renal arteries and patent aorta and iliac segments with some mild non-flow-limiting stenosis.    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  He does have a history of a right thalamic lacunar infarct in February 2012. At that time carotid Dopplers noted less than 50% stenosis  bilaterally.   Past Medical History  Diagnosis Date  . Diabetes mellitus   . Neuromuscular disorder   . Hyperlipidemia   . Hypertension   . CVA (cerebral infarction) feb 2012    r thalamic lacunar  . Peripheral vascular disease in diabetes mellitus Sept 2013     95% occlusion s/p PTCA R SFA Dew Sept 2013  . Carotid artery stenosis Feb 2012    <50% bilaterally    Past Surgical History  Procedure Laterality Date  . Upper gastrointestinal endoscopy         The following portions of the patient's history were reviewed and updated as appropriate: Allergies, current medications, and problem list.    Review of Systems:   12 Pt  review of systems was negative except those addressed in the HPI,     History   Social History  . Marital Status: Married    Spouse Name: N/A    Number of Children: N/A  . Years of Education: N/A   Occupational History  . Not on file.   Social History Main Topics  . Smoking status: Former Smoker    Quit date: 04/21/1968  . Smokeless tobacco: Never Used     Comment: quit 1969  . Alcohol Use: No  . Drug Use: No  . Sexually Active: Not on file  Other Topics Concern  . Not on file   Social History Narrative  . No narrative on file    Objective:  BP 140/78  Pulse 67  Temp(Src) 97.8 F (36.6 C) (Oral)  Resp 16  Ht 5\' 10"  (1.778 m)  Wt 191 lb 8 oz (86.864 kg)  BMI 27.48 kg/m2  SpO2 97%  General appearance: alert, cooperative and appears stated age Ears: normal TM's and external ear canals both ears Throat: lips, mucosa, and tongue normal; teeth and gums normal Neck: no adenopathy, no carotid bruit, supple, symmetrical, trachea midline and thyroid not enlarged, symmetric, no tenderness/mass/nodules Back: symmetric, no curvature. ROM normal. No CVA tenderness. Lungs: clear to auscultation bilaterally Heart: regular rate and rhythm, S1, S2 normal, no murmur, click, rub or gallop Abdomen: soft, non-tender; bowel sounds normal; no  masses,  no organomegaly Pulses: 2+ and symmetric Skin: Skin color, texture, turgor normal. No rashes or lesions Lymph nodes: Cervical, supraclavicular, and axillary nodes normal. Foot exam:  Nails are dystrophic but well trimmed,  No callouses,  Sensation intact to microfilament  Assessment and Plan:  Peripheral vascular disease in diabetes mellitus S/p intervention by Festus Barren.  Feet are both well perfused on todays exam and pulses are palpable.   Left knee DJD S/p TKR Sept 2013 by Reita Chard.   Hypertension Well controlled on current regimen. Renal function assessment is due, no changes today.  Hyperlipidemia On statin and fenofibrate.  Fasting lipids and CMET due.   Diabetes mellitus type II, uncontrolled Needs A1c and referral to diabetes education seminars at [facility].    Updated Medication List Outpatient Encounter Prescriptions as of 02/15/2013  Medication Sig Dispense Refill  . amLODipine (NORVASC) 10 MG tablet TAKE 1 TABLET BY MOUTH EVERY DAY  30 tablet  3  . atorvastatin (LIPITOR) 40 MG tablet TAKE 1 TABLET BY MOUTH DAILY  30 tablet  5  . atorvastatin (LIPITOR) 80 MG tablet TAKE ONE TABLET BY MOUTH DAILY  30 tablet  0  . carisoprodol (SOMA) 350 MG tablet Take 350 mg by mouth 2 (two) times daily as needed.      . cilostazol (PLETAL) 100 MG tablet Take 100 mg by mouth 2 (two) times daily.      . fenofibrate micronized (LOFIBRA) 134 MG capsule Take 1 capsule (134 mg total) by mouth daily before breakfast.  30 capsule  6  . glipiZIDE (GLUCOTROL) 10 MG tablet TAKE 1 TABLET BY MOUTH TWICE DAILY  60 tablet  0  . glucose blood (BAYER CONTOUR TEST) test strip Check blood sugar three times a day.  100 each  5  . glucose blood (BAYER CONTOUR TEST) test strip Use as instructed  100 each  12  . HUMULIN R 100 UNIT/ML injection INJECT 10 UNITS UNDER THE SKIN THREE TIMES DAILY BEFORE MEALS  10 mL  0  . HYDROcodone-acetaminophen (NORCO/VICODIN) 5-325 MG per tablet Take 1 tablet by  mouth every 6 (six) hours as needed for pain. May refill on or after Nov 22 2012  90 tablet  3  . insulin lispro protamine-insulin lispro (HUMALOG 75/25) (75-25) 100 UNIT/ML SUSP Inject 15 units before breakfast and 17 units before dinner  15 mL  11  . INSULIN SYRINGE .5CC/29G 29G X 1/2" 0.5 ML MISC 1 Syringe by Does not apply route 3 (three) times daily.  100 each  6  . losartan (COZAAR) 100 MG tablet Take 1 tablet (100 mg total) by mouth daily.  30 tablet  0  .  metFORMIN (GLUCOPHAGE) 1000 MG tablet TAKE 1 TABLET BY MOUTH TWICE DAILY  180 tablet  2  . metoprolol succinate (TOPROL-XL) 100 MG 24 hr tablet TAKE ONE TABLET BY MOUTH DAILY  30 tablet  6  . zolpidem (AMBIEN) 5 MG tablet TAKE ONE TABLET BY MOUTH AT BEDTIME AS NEEDED FOR SLEEP  30 tablet  2  . [DISCONTINUED] losartan (COZAAR) 100 MG tablet TAKE 1 TABLET BY MOUTH ONCE DAILY  30 tablet  0  . [DISCONTINUED] zolpidem (AMBIEN) 5 MG tablet TAKE ONE TABLET BY MOUTH AT BEDTIME AS NEEDED FOR SLEEP  30 tablet  2  . atorvastatin (LIPITOR) 80 MG tablet Take 40 mg by mouth daily.      [name] glipiZIDE (GLUCOTROL) 10 MG tablet Take 1 tablet (10 mg total) by mouth 2 (two) times daily.  60 tablet  11  . sildenafil (VIAGRA) 50 MG tablet Take 1 tablet (50 mg total) by mouth daily as needed for erectile dysfunction.  10 tablet  3  . [DISCONTINUED] losartan (COZAAR) 100 MG tablet TAKE 1 TABLET BY MOUTH ONCE DAILY  30 tablet  5   No facility-administered encounter medications on file as of 02/15/2013.     Orders Placed This Encounter  Procedures  . LDL cholesterol, direct  . Hemoglobin A1c  . Microalbumin / creatinine urine ratio  . Comprehensive metabolic panel  . Ambulatory referral to diabetic education    No Follow-up on file.

## 2013-02-16 ENCOUNTER — Telehealth: Payer: Self-pay | Admitting: *Deleted

## 2013-02-16 LAB — COMPREHENSIVE METABOLIC PANEL
ALT: 22 U/L (ref 0–53)
CO2: 28 mEq/L (ref 19–32)
Calcium: 9.7 mg/dL (ref 8.4–10.5)
Chloride: 101 mEq/L (ref 96–112)
Creatinine, Ser: 1.3 mg/dL (ref 0.4–1.5)
GFR: 57.8 mL/min — ABNORMAL LOW (ref >60.00)
Glucose, Bld: 71 mg/dL (ref 70–99)
Total Bilirubin: 0.5 mg/dL (ref 0.3–1.2)
Total Protein: 7.4 g/dL (ref 6.0–8.3)

## 2013-02-16 LAB — LDL CHOLESTEROL, DIRECT: Direct LDL: 67.8 mg/dL

## 2013-02-16 LAB — HEMOGLOBIN A1C: Hgb A1c MFr Bld: 7.3 % — ABNORMAL HIGH (ref 4.6–6.5)

## 2013-02-16 NOTE — Telephone Encounter (Signed)
Script called to pharmacy as requested. Disregard earlier note.

## 2013-02-16 NOTE — Telephone Encounter (Signed)
Patinnent called and sated he did not get Ambien script.

## 2013-02-16 NOTE — Assessment & Plan Note (Signed)
Well controlled on current regimen. Renal function assessment is due , no changes today. 

## 2013-02-16 NOTE — Assessment & Plan Note (Signed)
S/p intervention by Festus Barren.  Feet are both well perfused on todays exam and pulses are palpable.

## 2013-02-16 NOTE — Assessment & Plan Note (Signed)
Needs A1c and referral to diabetes education seminars at [facility].

## 2013-02-16 NOTE — Assessment & Plan Note (Signed)
S/p TKR Sept 2013 by Reita Chard.

## 2013-02-16 NOTE — Assessment & Plan Note (Signed)
On statin and fenofibrate.  Fasting lipids and CMET due.

## 2013-02-17 ENCOUNTER — Encounter: Payer: Self-pay | Admitting: *Deleted

## 2013-02-26 ENCOUNTER — Other Ambulatory Visit: Payer: Self-pay | Admitting: Internal Medicine

## 2013-03-18 ENCOUNTER — Other Ambulatory Visit: Payer: Self-pay | Admitting: Internal Medicine

## 2013-03-18 NOTE — Telephone Encounter (Signed)
Verified with pharmacist at [facility], pt received #30 on 02/16/13 with no refills. Ok to refill?

## 2013-03-19 NOTE — Telephone Encounter (Signed)
Script faxed.

## 2013-03-19 NOTE — Telephone Encounter (Signed)
Rx for insulin syringes faxed to pharmacy.

## 2013-03-29 ENCOUNTER — Telehealth: Payer: Self-pay | Admitting: Internal Medicine

## 2013-03-29 NOTE — Telephone Encounter (Signed)
Left message on cell phone for pt to call office  Ask pt if he could come in 04/01/13@ 1:45  Dr Darrick Huntsman will be out of office 6/13

## 2013-03-29 NOTE — Telephone Encounter (Signed)
Pt aware of appoitnment date and time change

## 2013-04-01 ENCOUNTER — Ambulatory Visit (INDEPENDENT_AMBULATORY_CARE_PROVIDER_SITE_OTHER): Payer: Medicare Other | Admitting: Internal Medicine

## 2013-04-01 ENCOUNTER — Encounter: Payer: Self-pay | Admitting: Internal Medicine

## 2013-04-01 VITALS — BP 146/79 | HR 62 | Temp 97.5°F | Ht 69.0 in | Wt 195.0 lb

## 2013-04-01 DIAGNOSIS — I6523 Occlusion and stenosis of bilateral carotid arteries: Secondary | ICD-10-CM

## 2013-04-01 DIAGNOSIS — I6529 Occlusion and stenosis of unspecified carotid artery: Secondary | ICD-10-CM

## 2013-04-01 DIAGNOSIS — I10 Essential (primary) hypertension: Secondary | ICD-10-CM

## 2013-04-01 DIAGNOSIS — R350 Frequency of micturition: Secondary | ICD-10-CM

## 2013-04-01 DIAGNOSIS — IMO0002 Reserved for concepts with insufficient information to code with codable children: Secondary | ICD-10-CM

## 2013-04-01 DIAGNOSIS — E1151 Type 2 diabetes mellitus with diabetic peripheral angiopathy without gangrene: Secondary | ICD-10-CM

## 2013-04-01 DIAGNOSIS — I658 Occlusion and stenosis of other precerebral arteries: Secondary | ICD-10-CM

## 2013-04-01 DIAGNOSIS — Z125 Encounter for screening for malignant neoplasm of prostate: Secondary | ICD-10-CM

## 2013-04-01 DIAGNOSIS — E1165 Type 2 diabetes mellitus with hyperglycemia: Secondary | ICD-10-CM

## 2013-04-01 DIAGNOSIS — E1159 Type 2 diabetes mellitus with other circulatory complications: Secondary | ICD-10-CM

## 2013-04-01 DIAGNOSIS — Z0000 Encounter for general adult medical examination without abnormal findings: Secondary | ICD-10-CM

## 2013-04-01 DIAGNOSIS — I798 Other disorders of arteries, arterioles and capillaries in diseases classified elsewhere: Secondary | ICD-10-CM

## 2013-04-01 NOTE — Patient Instructions (Addendum)
You had your annual Medicare wellness exam today.  Your prostate is not enlarged.  Try cutting out all caffeine after 1 pm to see if the night time urinary frequency improves.  If not,  We will try a medicine for overactive bladder. We are checking your urine for infection today  Please use the stool kit to send [place] back a sample to test for blood.  This is your colon CA screening test.   You need to have a TDaP vaccine.    I have given you prescription for this because it will be cheaper at the health Dept because Medicare will not reimburse for them.   You are due for fasting bloodwork in late July/early August .  Please make an appt for labs and a follow up with me afterward

## 2013-04-01 NOTE — Progress Notes (Signed)
Patient ID: Henry Lindsey, male   DOB: 1933-04-09, 79 y.o.   MRN: [id number]  The patient is here for annual Medicare wellness examination and management of other chronic and acute problems.  Has had difficulty achieiving a full erection and a normal volume of ejaculate , and he has a twinge of right testicular pain during ejaculation .  Feels that he started having problems after his angioplasty on the left  Side by Dr. Wyn Quaker.  He is not sexually active and does not want to see a urologist.    The risk factors are reflected in the social history.  The roster of all physicians providing medical care to patient - is listed in the Snapshot section of the chart.  Activities of daily living:  The patient is 100% independent in all ADLs: dressing, toileting, feeding as well as independent mobility  Home safety : The patient has smoke detectors in the home. They wear seatbelts.  There are no firearms at home. There is no violence in the home.   There is no risks for hepatitis, STDs or HIV. There is no history of blood transfusion. They have no travel history to infectious disease endemic areas of the world.  The patient has not seen their dentist in the last six month. They have not made an appt with their eye doctor in the last year. They havew no hearing difficulty with regard to whispered voices and some television programs.  They have deferred audiologic testing in the last year.  They do not  have excessive sun exposure. Discussed the need for sun protection: hats, long sleeves and use of sunscreen if there is significant sun exposure.   Diet: the importance of a healthy diet is discussed. They do have a healthy diet.  The benefits of regular aerobic exercise were discussed. he walks 4 times per week ,  20 minutes.   Depression screen: there are no signs or vegative symptoms of depression- irritability, change in appetite, anhedonia, sadness/tearfullness.  Cognitive assessment: the patient manages  all their financial and personal affairs and is actively engaged. They could relate day,date,year and events; recalled 2/3 objects at 3 minutes; performed clock-face test normally.  The following portions of the patient's history were reviewed and updated as appropriate: allergies, current medications, past family history, past medical history,  past surgical history, past social history  and problem list.  Visual acuity was not assessed per patient preference since she has regular follow up with her ophthalmologist. Hearing and body mass index were assessed and reviewed.   During the course of the visit the patient was educated and counseled about appropriate screening and preventive services including : fall prevention , diabetes screening, nutrition counseling, colorectal cancer screening, and recommended immunizations.     Objective   BP 146/79  Pulse 62  Temp(Src) 97.5 F (36.4 C) (Oral)  Ht 5\' 9"  (1.753 m)  Wt 195 lb (88.451 kg)  BMI 28.78 kg/m2  SpO2 96%  General Appearance:    Alert, cooperative, no distress, appears stated age  Head:    Normocephalic, without obvious abnormality, atraumatic  Eyes:    PERRL, conjunctiva/corneas clear, EOM's intact, fundi    benign, both eyes       Ears:    Normal TM's and external ear canals, both ears  Nose:   Nares normal, septum midline, mucosa normal, no drainage   or sinus tenderness  Throat:   Lips, mucosa, and tongue normal; teeth and gums normal  Neck:  Supple, symmetrical, trachea midline, no adenopathy;       thyroid:  No enlargement/tenderness/nodules; left carotid   Bruit, no JVD  Back:     Symmetric, no curvature, ROM normal, no CVA tenderness  Lungs:     Clear to auscultation bilaterally, respirations unlabored  Chest wall:    No tenderness or deformity  Heart:    Regular rate and rhythm, S1 and S2 normal, no murmur, rub   or gallop  Abdomen:     Soft, non-tender, bowel sounds active all four quadrants,    no masses, no  organomegaly  Genitalia:    Normal male without lesion, discharge or tenderness  Rectal:    Normal tone, normal prostate, no masses or tenderness;     Extremities:   Extremities normal, atraumatic, no cyanosis or edema  Pulses:   2+ and symmetric all extremities  Skin:   Skin color, texture, turgor normal, no rashes or lesions  Lymph nodes:   Cervical, supraclavicular, and axillary nodes normal  Neurologic:   CNII-XII intact. Normal strength, sensation and reflexes      Throughout  Foot exam:  Nails are well trimmed,  No callouses,  Sensation intact to microfilament      Assessment and Plan  Peripheral vascular disease in diabetes mellitus S/p angioplasty by Dr. Wyn Quaker with restoration of flow to the Right leg and no current claudication symptoms or diminished pulses  By exam today.  Follow up with Loletha Carrow in August 2014   Routine general medical examination at a health care facility Annual male exam was done including testicular and prostate exam. .  Colon ca screening was reviewed and options given.    Occlusion and stenosis of carotid artery with cerebral infarction Managed with statin, ASA and 6 month surveillance due in August at [facility]   Diabetes mellitus type II, uncontrolled Improving control by last A11c of 7.3 in April ,.  No changes to regimen today. Low Gi diet reviewed.   He is up-to-date on eye exams and his foot exam is norma.  He is on the appropriate medications.  Hypertension Well controlled on current regimen. Renal function stable, no changes today.   Updated Medication List Outpatient Encounter Prescriptions as of 04/01/2013  Medication Sig Dispense Refill  . amLODipine (NORVASC) 10 MG tablet TAKE 1 TABLET BY MOUTH EVERY DAY  30 tablet  3  . atorvastatin (LIPITOR) 40 MG tablet TAKE 1 TABLET BY MOUTH DAILY  30 tablet  5  . atorvastatin (LIPITOR) 80 MG tablet TAKE ONE TABLET BY MOUTH DAILY  30 tablet  0  . carisoprodol (SOMA) 350 MG tablet Take 350 mg by mouth 2  (two) times daily as needed.      . cilostazol (PLETAL) 100 MG tablet Take 100 mg by mouth 2 (two) times daily.      . fenofibrate micronized (LOFIBRA) 134 MG capsule Take 1 capsule (134 mg total) by mouth daily before breakfast.  30 capsule  6  . glipiZIDE (GLUCOTROL) 10 MG tablet Take 1 tablet (10 mg total) by mouth 2 (two) times daily.  60 tablet  11  . glipiZIDE (GLUCOTROL) 10 MG tablet TAKE 1 TABLET BY MOUTH TWICE DAILY  60 tablet  0  . glucose blood (BAYER CONTOUR TEST) test strip Check blood sugar three times a day.  100 each  5  . glucose blood (BAYER CONTOUR TEST) test strip Use as instructed  100 each  12  . HUMULIN R 100 UNIT/ML injection INJECT 10  UNITS UNDER THE SKIN THREE TIMES DAILY BEFORE MEALS  10 mL  0  . HYDROcodone-acetaminophen (NORCO/VICODIN) 5-325 MG per tablet Take 1 tablet by mouth every 6 (six) hours as needed for pain. May refill on or after Nov 22 2012  90 tablet  3  . insulin lispro protamine-insulin lispro (HUMALOG 75/25) (75-25) 100 UNIT/ML SUSP Inject 15 units before breakfast and 17 units before dinner  15 mL  11  . INSULIN SYRINGE .5CC/29G 29G X 1/2" 0.5 ML MISC 1 Syringe by Does not apply route 3 (three) times daily.  100 each  6  . Insulin Syringe-Needle U-100 (INSULIN SYRINGE .5CC/31GX5/16") 31G X 5/16" 0.5 ML MISC USE THREE TIMES DAILY  100 each  5  . losartan (COZAAR) 100 MG tablet Take 1 tablet (100 mg total) by mouth daily.  30 tablet  0  . metFORMIN (GLUCOPHAGE) 1000 MG tablet TAKE 1 TABLET BY MOUTH TWICE DAILY  180 tablet  2  . metoprolol succinate (TOPROL-XL) 100 MG 24 hr tablet TAKE 1 TABLET BY MOUTH EVERY DAY  30 tablet  5  . sildenafil (VIAGRA) 50 MG tablet Take 1 tablet (50 mg total) by mouth daily as needed for erectile dysfunction.  10 tablet  3  . zolpidem (AMBIEN) 5 MG tablet TAKE 1 TABLET BY MOUTH EVERY NIGHT AT BEDTIME AS NEEDED FOR SLEEP  30 tablet  1  . [DISCONTINUED] atorvastatin (LIPITOR) 80 MG tablet Take 40 mg by mouth daily.       No  facility-administered encounter medications on file as of 04/01/2013.

## 2013-04-02 ENCOUNTER — Encounter: Payer: Medicare Other | Admitting: Internal Medicine

## 2013-04-03 NOTE — Assessment & Plan Note (Addendum)
S/p angioplasty by Dr. Wyn Quaker with restoration of flow to the Right leg and no current claudication symptoms or diminished pulses  By exam today.  Follow up with Loletha Carrow in August 2014

## 2013-04-04 DIAGNOSIS — Z0000 Encounter for general adult medical examination without abnormal findings: Secondary | ICD-10-CM | POA: Insufficient documentation

## 2013-04-04 NOTE — Assessment & Plan Note (Signed)
Managed with statin, ASA and 6 month surveillance due in August at [facility]

## 2013-04-04 NOTE — Assessment & Plan Note (Signed)
Well controlled on current regimen. Renal function stable, no changes today. 

## 2013-04-04 NOTE — Assessment & Plan Note (Signed)
Improving control by last A11c of 7.3 in April ,.  No changes to regimen today. Low Gi diet reviewed.   He is up-to-date on eye exams and his foot exam is norma.  He is on the appropriate medications.

## 2013-04-04 NOTE — Assessment & Plan Note (Signed)
Annual male exam was done including testicular and prostate exam. PSA is pending .  Colon ca screening was reviewed and options given.   

## 2013-04-06 ENCOUNTER — Other Ambulatory Visit: Payer: Self-pay | Admitting: *Deleted

## 2013-04-06 ENCOUNTER — Other Ambulatory Visit (INDEPENDENT_AMBULATORY_CARE_PROVIDER_SITE_OTHER): Payer: Medicare Other

## 2013-04-06 DIAGNOSIS — Z1211 Encounter for screening for malignant neoplasm of colon: Secondary | ICD-10-CM

## 2013-04-06 LAB — FECAL OCCULT BLOOD, IMMUNOCHEMICAL: Fecal Occult Bld: NEGATIVE

## 2013-04-07 ENCOUNTER — Other Ambulatory Visit: Payer: Self-pay | Admitting: Internal Medicine

## 2013-05-03 ENCOUNTER — Other Ambulatory Visit: Payer: Self-pay | Admitting: Internal Medicine

## 2013-05-16 ENCOUNTER — Other Ambulatory Visit: Payer: Self-pay | Admitting: Internal Medicine

## 2013-05-17 NOTE — Telephone Encounter (Signed)
Okay to refill? 

## 2013-05-19 ENCOUNTER — Other Ambulatory Visit: Payer: Self-pay | Admitting: *Deleted

## 2013-05-19 ENCOUNTER — Other Ambulatory Visit: Payer: Self-pay | Admitting: Internal Medicine

## 2013-05-20 MED ORDER — HYDROCODONE-ACETAMINOPHEN 5-325 MG PO TABS: 1.0000 | ORAL_TABLET | Freq: Four times a day (QID) | ORAL | Status: DC | PRN

## 2013-05-21 NOTE — Telephone Encounter (Signed)
Rx faxed to pharmacy  

## 2013-05-24 ENCOUNTER — Other Ambulatory Visit (INDEPENDENT_AMBULATORY_CARE_PROVIDER_SITE_OTHER): Payer: Medicare Other

## 2013-05-24 ENCOUNTER — Telehealth: Payer: Self-pay | Admitting: Internal Medicine

## 2013-05-24 DIAGNOSIS — E1165 Type 2 diabetes mellitus with hyperglycemia: Secondary | ICD-10-CM

## 2013-05-24 LAB — COMPREHENSIVE METABOLIC PANEL
AST: 28 U/L (ref 0–37)
Albumin: 4.4 g/dL (ref 3.5–5.2)
Alkaline Phosphatase: 45 U/L (ref 39–117)
Glucose, Bld: 181 mg/dL — ABNORMAL HIGH (ref 70–99)
Potassium: 4.9 mEq/L (ref 3.5–5.1)
Sodium: 139 mEq/L (ref 135–145)
Total Bilirubin: 0.7 mg/dL (ref 0.3–1.2)
Total Protein: 7.1 g/dL (ref 6.0–8.3)

## 2013-05-24 LAB — LIPID PANEL
LDL Cholesterol: 35 mg/dL (ref 0–99)
Total CHOL/HDL Ratio: 3
VLDL: 34.2 mg/dL (ref 0.0–40.0)

## 2013-05-24 LAB — HEMOGLOBIN A1C: Hgb A1c MFr Bld: 7.8 % — ABNORMAL HIGH (ref 4.6–6.5)

## 2013-05-24 NOTE — Telephone Encounter (Signed)
Pt came in today wanting to see if dr Darrick Huntsman would write him a rx for stendra   His drug store stated he could get 3 pills free  Please advise   [facility] [address]   Pt is also checking on his Remus Loffler  Drug stated this needed prior autho

## 2013-05-24 NOTE — Telephone Encounter (Signed)
Patient submitted log of BS .  He has only checked it 3 times  Since May 1.  2/3 were high , both were fasting; please ask him to make appt for fasting labs and a follow up  OV  To discuss labs.

## 2013-05-24 NOTE — Assessment & Plan Note (Signed)
Patient submitted log of BS .  He has only checked it 3 times  Since May 1.  2/3 were high , both were fasting;

## 2013-05-24 NOTE — Telephone Encounter (Signed)
What labs and dx?  

## 2013-05-25 NOTE — Telephone Encounter (Signed)
Patient scheduled for fasting labs 05/27/13 and appointment with you on 05/31/13 FYI.

## 2013-05-27 ENCOUNTER — Other Ambulatory Visit (INDEPENDENT_AMBULATORY_CARE_PROVIDER_SITE_OTHER): Payer: Medicare Other

## 2013-05-27 ENCOUNTER — Telehealth: Payer: Self-pay | Admitting: *Deleted

## 2013-05-27 DIAGNOSIS — IMO0001 Reserved for inherently not codable concepts without codable children: Secondary | ICD-10-CM

## 2013-05-27 DIAGNOSIS — E1165 Type 2 diabetes mellitus with hyperglycemia: Secondary | ICD-10-CM

## 2013-05-27 LAB — MICROALBUMIN / CREATININE URINE RATIO: Microalb, Ur: 3.1 mg/dL — ABNORMAL HIGH (ref 0.0–1.9)

## 2013-05-27 NOTE — Telephone Encounter (Signed)
This won't be done today bc it is my half day and I have no idea what medication he is talking it about.

## 2013-05-27 NOTE — Telephone Encounter (Signed)
Pt states that he needs a Rx for the Stendra (avanafil) Tablet, he has called the company to get 3 free samples but he needs an Rx.  Would like this done before the day is over.   [phone]

## 2013-05-27 NOTE — Telephone Encounter (Signed)
Patient notified that script could not be written until he saw Dr, to discuss his medical problems.

## 2013-05-31 ENCOUNTER — Encounter: Payer: Self-pay | Admitting: Internal Medicine

## 2013-05-31 ENCOUNTER — Ambulatory Visit (INDEPENDENT_AMBULATORY_CARE_PROVIDER_SITE_OTHER): Payer: Medicare Other | Admitting: Internal Medicine

## 2013-05-31 VITALS — BP 132/58 | HR 69 | Temp 98.4°F | Resp 14 | Wt 202.8 lb

## 2013-05-31 DIAGNOSIS — E1165 Type 2 diabetes mellitus with hyperglycemia: Secondary | ICD-10-CM

## 2013-05-31 DIAGNOSIS — Z733 Stress, not elsewhere classified: Secondary | ICD-10-CM

## 2013-05-31 DIAGNOSIS — F4321 Adjustment disorder with depressed mood: Secondary | ICD-10-CM

## 2013-05-31 DIAGNOSIS — M1710 Unilateral primary osteoarthritis, unspecified knee: Secondary | ICD-10-CM

## 2013-05-31 DIAGNOSIS — N529 Male erectile dysfunction, unspecified: Secondary | ICD-10-CM

## 2013-05-31 DIAGNOSIS — M1712 Unilateral primary osteoarthritis, left knee: Secondary | ICD-10-CM

## 2013-05-31 DIAGNOSIS — I739 Peripheral vascular disease, unspecified: Secondary | ICD-10-CM

## 2013-05-31 DIAGNOSIS — Z634 Disappearance and death of family member: Secondary | ICD-10-CM

## 2013-05-31 DIAGNOSIS — E785 Hyperlipidemia, unspecified: Secondary | ICD-10-CM

## 2013-05-31 DIAGNOSIS — I10 Essential (primary) hypertension: Secondary | ICD-10-CM

## 2013-05-31 MED ORDER — AVANAFIL 50 MG PO TABS: 1.0000 | ORAL_TABLET | Freq: Every day | ORAL | Status: DC

## 2013-05-31 NOTE — Progress Notes (Signed)
Patient ID: Henry Lindsey, male   DOB: [birth year], 79 y.o.   MRN: [id number]   Patient Active Problem List   Diagnosis Date Noted  . Impotence due to erectile dysfunction 06/01/2013  . Routine general medical examination at a health care facility 04/04/2013  . CAD (coronary artery disease) 07/21/2012  . Peripheral vascular disease in diabetes mellitus   . Occlusion and stenosis of carotid artery with cerebral infarction   . PAD (peripheral artery disease) 07/01/2012  . Bereavement due to life event 04/23/2012  . Basal Cell Carcinoma Of Cheek 08/05/2011  . 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 Diabetes mellitus type II, uncontrolled 08/05/2011  . Hyperlipidemia   . Hypertension   . CVA (cerebral infarction)     Subjective:  CC:   Chief Complaint  Patient presents with  . Follow-up    discuss medication.  . Diabetes    HPI:   Henry Lindsey a 79 y.o. male who presents 3 month diabetes follow up .  Has been Using 75/ 25 insulin 10 units twice daily but has been taking his second dose before lunch  Instead of dinner and has been having "sweat's in the late afternoon.  He has not checked his blood sugar during any of these episodes. His diet has been  reviewed and he is having one starch per meal. He is exercising several times a week in the [employer] program. Has not had a diabetic eye exam in 5 years.  Does not want me to schedule because he is going to the [place] is months to see what benefits she is entitled to. He does not walk i barefoot anywhere because his feet are cold continually. He does not have any burning or other signs of neuropathy by history.  2) nocturia and ED.  he is requesting a prescription for a new medication for erectile dysfunction and like to see a urologist about his enlarged prostate however he wants to wait until after he goes to the [facility]. He was recently widowed and has begun seeing a new lady friend whom he met at the [employer] exercise class.. They  have not been intimate yet    Past Medical History  Diagnosis Date  . Diabetes mellitus   . Neuromuscular disorder   . Hyperlipidemia   . Hypertension   . CVA (cerebral infarction) feb 2012    r thalamic lacunar  . Peripheral vascular disease in diabetes mellitus Sept 2013     95% occlusion s/p PTCA R SFA Dew Sept 2013  . Carotid artery stenosis Feb 2012    <50% bilaterally    Past Surgical History  Procedure Laterality Date  . Upper gastrointestinal endoscopy         The following portions of the patient's history were reviewed and updated as appropriate: Allergies, current medications, and problem list.    Review of Systems:   12 Pt  review of systems was negative except those addressed in the HPI,     History   Social History  . Marital Status: Married    Spouse Name: N/A    Number of Children: N/A  . Years of Education: N/A   Occupational History  . Not on file.   Social History Main Topics  . Smoking status: Former Smoker    Quit date: 04/21/1968  . Smokeless tobacco: Never Used     Comment: quit 1969  . Alcohol Use: No  . Drug Use: No  . Sexually Active: Not  on file   Other Topics Concern  . Not on file   Social History Narrative  . No narrative on file    Objective:  Filed Vitals:   05/31/13 1329  BP: 132/58  Pulse: 69  Temp: 98.4 F (36.9 C)  Resp: 14     General appearance: alert, cooperative and appears stated age Ears: normal TM's and external ear canals both ears Throat: lips, mucosa, and tongue normal; teeth and gums normal Neck: no adenopathy, no carotid bruit, supple, symmetrical, trachea midline and thyroid not enlarged, symmetric, no tenderness/mass/nodules Back: symmetric, no curvature. ROM normal. No CVA tenderness. Lungs: clear to auscultation bilaterally Heart: regular rate and rhythm, S1, S2 normal, no murmur, click, rub or gallop Abdomen: soft, non-tender; bowel sounds normal; no masses,  no organomegaly Pulses:  2+ and symmetric Skin: Skin color, texture, turgor normal. No rashes or lesions Lymph nodes: Cervical, supraclavicular, and axillary nodes normal. Foot exam:  Nails are well trimmed,  No callouses,  Sensation intact to  cotton ball   Assessment and Plan:  Diabetes mellitus type II, uncontrolled His A1c is higher this time. He is resistant to taking additional medications because of these afternoon episodes of sweating. We discussed his use of insulin his inappropriate administration of his second dose at lunchtime. I explained to him that his insulin doses are overlapping and that is why he is having what sounds like hypoglycemic events in the afternoon. I have advised him to take his second dose before his evening meal and to keep a log of his blood sugars and return for repeat evaluation in one month. I've tried to refer him for diabetic eye exam he is resistant to this until he sees what his benefits are at the [facility]  Bereavement due to life event He  appears to be recovering well from the loss of his wife and is now interested in having a relationship with another woman  Hyperlipidemia He is unsure whether he is taking a tour statin and fenofibrate. He has had the atorvastatin refills. I have advised him to continue atorvastatin 40 mg and fenofibrate for management of LDL and triglyceridemia  Left knee DJD He is status post total left knee replacement by Reita Chard with improved range of motion. He is now having pain in the right knee which is being managed with steroid injections.  Hypertension Well controlled on current regimen. Renal function stable, no changes today.  PAD (peripheral artery disease) He is s/p PTCA with angioplasty of right SFA Feb 2013 and is maintaining regular follow up with Dr. Wyn Quaker. Feet are well perfused today and he has no claudication symptoms with regular exercise. Continue aspirin and statin.  Impotence due to erectile dysfunction Prescription for  new medication, Avanafil given   Updated Medication List Outpatient Encounter Prescriptions as of 05/31/2013  Medication Sig Dispense Refill  . amLODipine (NORVASC) 10 MG tablet TAKE 1 TABLET BY MOUTH EVERY DAY  30 tablet  3  . atorvastatin (LIPITOR) 40 MG tablet TAKE 1 TABLET BY MOUTH DAILY  30 tablet  5  . fenofibrate micronized (LOFIBRA) 134 MG capsule TAKE 1 CAPSULE BY MOUTH EVERY DAY BEFORE BREAKFAST  30 capsule  5  . glipiZIDE (GLUCOTROL) 10 MG tablet TAKE 1 TABLET BY MOUTH TWICE DAILY  60 tablet  5  . glucose blood (BAYER CONTOUR TEST) test strip Check blood sugar three times a day.  100 each  5  . glucose blood (BAYER CONTOUR TEST) test strip Use as  instructed  100 each  12  . HYDROcodone-acetaminophen (NORCO/VICODIN) 5-325 MG per tablet Take 1 tablet by mouth every 6 (six) hours as needed for pain.  90 tablet  3  . insulin lispro protamine-insulin lispro (HUMALOG 75/25) (75-25) 100 UNIT/ML SUSP Inject 15 units before breakfast and 17 units before dinner  15 mL  11  . INSULIN SYRINGE .5CC/29G 29G X 1/2" 0.5 ML MISC 1 Syringe by Does not apply route 3 (three) times daily.  100 each  6  . Insulin Syringe-Needle U-100 (INSULIN SYRINGE .5CC/31GX5/16") 31G X 5/16" 0.5 ML MISC USE THREE TIMES DAILY  100 each  5  . losartan (COZAAR) 100 MG tablet TAKE 1 TABLET BY MOUTH EVERY DAY  30 tablet  5  . metFORMIN (GLUCOPHAGE) 1000 MG tablet TAKE 1 TABLET BY MOUTH TWICE DAILY  180 tablet  2  . metoprolol succinate (TOPROL-XL) 100 MG 24 hr tablet TAKE 1 TABLET BY MOUTH EVERY DAY  30 tablet  5  . [DISCONTINUED] atorvastatin (LIPITOR) 80 MG tablet TAKE ONE TABLET BY MOUTH DAILY  30 tablet  0  . [DISCONTINUED] losartan (COZAAR) 100 MG tablet Take 1 tablet (100 mg total) by mouth daily.  30 tablet  0  . Avanafil (STENDRA) 50 MG TABS Take 1 tablet by mouth daily.  30 tablet  0  . Avanafil (STENDRA) 50 MG TABS Take 1 tablet by mouth daily.  10 tablet  5  . cilostazol (PLETAL) 100 MG tablet Take 100 mg by  mouth 2 (two) times daily.      [name] HUMULIN R 100 UNIT/ML injection INJECT 10 UNITS UNDER THE SKIN THREE TIMES DAILY BEFORE MEALS  10 mL  0  . [DISCONTINUED] carisoprodol (SOMA) 350 MG tablet Take 350 mg by mouth 2 (two) times daily as needed.      . [DISCONTINUED] glipiZIDE (GLUCOTROL) 10 MG tablet Take 1 tablet (10 mg total) by mouth 2 (two) times daily.  60 tablet  11  . [DISCONTINUED] sildenafil (VIAGRA) 50 MG tablet Take 1 tablet (50 mg total) by mouth daily as needed for erectile dysfunction.  10 tablet  3  . [DISCONTINUED] zolpidem (AMBIEN) 5 MG tablet TAKE 1 TABLET BY MOUTH EVERY NIGHT AT BEDTIME AS NEEDED FOR SLEEP  30 tablet  0   No facility-administered encounter medications on file as of 05/31/2013.

## 2013-05-31 NOTE — Patient Instructions (Addendum)
Your diabetes is not well controlled. Your afternoon "sweats " are due to you taking you insulin wrong  Take your second insulin dose  before your dinner , not before lunch.  10 units  To start with  Your morning fasting sugars should be between 80 to 120   Remember that blueberries and straw berries are lower  In sugar than bananas, grapes and watermelon.    You are OVERDUE  for your yearlyt eye exam.  Please get this done  You should be taking fenofibrate AND atorvastatin 40 mg daily   Return in 3 months for next labs and office visit

## 2013-06-01 ENCOUNTER — Telehealth: Payer: Self-pay | Admitting: Internal Medicine

## 2013-06-01 ENCOUNTER — Encounter: Payer: Self-pay | Admitting: Internal Medicine

## 2013-06-01 DIAGNOSIS — E1169 Type 2 diabetes mellitus with other specified complication: Secondary | ICD-10-CM

## 2013-06-01 DIAGNOSIS — N529 Male erectile dysfunction, unspecified: Secondary | ICD-10-CM | POA: Insufficient documentation

## 2013-06-01 NOTE — Assessment & Plan Note (Signed)
He is status post total left knee replacement by Reita Chard with improved range of motion. He is now having pain in the right knee which is being managed with steroid injections.

## 2013-06-01 NOTE — Assessment & Plan Note (Signed)
His A1c is higher this time. He is resistant to taking additional medications because of these afternoon episodes of sweating. We discussed his use of insulin his inappropriate administration of his second dose at lunchtime. I explained to him that his insulin doses are overlapping and that is why he is having what sounds like hypoglycemic events in the afternoon. I have advised him to take his second dose before his evening meal and to keep a log of his blood sugars and return for repeat evaluation in one month. I've tried to refer him for diabetic eye exam he is resistant to this until he sees what his benefits are at the [facility].

## 2013-06-01 NOTE — Assessment & Plan Note (Addendum)
He  appears to be recovering well from the loss of his wife and is now interested in having a relationship with another woman

## 2013-06-01 NOTE — Assessment & Plan Note (Addendum)
He is s/p PTCA with angioplasty of right SFA Feb 2013 and is maintaining regular follow up with Dr. Wyn Quaker. Feet are well perfused today and he has no claudication symptoms with regular exercise. Continue aspirin and statin.

## 2013-06-01 NOTE — Assessment & Plan Note (Signed)
Well controlled on current regimen. Renal function stable, no changes today. 

## 2013-06-01 NOTE — Telephone Encounter (Signed)
Pt dropped off up to date med list  In box.  Pt would also like to get a referral to urology that dr Lauretta Grill recommends Please call pt with appointment

## 2013-06-01 NOTE — Assessment & Plan Note (Signed)
He is unsure whether he is taking a tour statin and fenofibrate. He has had the atorvastatin refills. I have advised him to continue atorvastatin 40 mg and fenofibrate for management of LDL and triglyceridemia

## 2013-06-01 NOTE — Assessment & Plan Note (Signed)
Prescription for new medication, Avanafil given

## 2013-06-02 NOTE — Telephone Encounter (Signed)
Referral is in process as requested 

## 2013-06-02 NOTE — Telephone Encounter (Signed)
Pt notified that referral is in process.

## 2013-06-02 NOTE — Telephone Encounter (Signed)
Placed in review folder please advise as to referral.

## 2013-06-08 ENCOUNTER — Encounter: Payer: Self-pay | Admitting: Internal Medicine

## 2013-06-09 ENCOUNTER — Telehealth: Payer: Self-pay | Admitting: Internal Medicine

## 2013-06-09 NOTE — Telephone Encounter (Signed)
Can we get prior for this medication

## 2013-06-09 NOTE — Telephone Encounter (Signed)
Pt is needing Prior Auth for medication Jerral Ralph). Pt uses [employer].

## 2013-06-11 NOTE — Telephone Encounter (Signed)
Called pts insurance, received PA request form, gave to [facility]

## 2013-06-21 ENCOUNTER — Other Ambulatory Visit: Payer: Self-pay | Admitting: Internal Medicine

## 2013-06-24 DIAGNOSIS — N50819 Testicular pain, unspecified: Secondary | ICD-10-CM | POA: Insufficient documentation

## 2013-06-24 DIAGNOSIS — N401 Enlarged prostate with lower urinary tract symptoms: Secondary | ICD-10-CM | POA: Insufficient documentation

## 2013-06-24 DIAGNOSIS — R351 Nocturia: Secondary | ICD-10-CM | POA: Insufficient documentation

## 2013-07-29 LAB — CBC AND DIFFERENTIAL
HEMATOCRIT: 46 % (ref 41–53)
Hemoglobin: 15.1 g/dL (ref 13.5–17.5)
PLATELETS: 213 10*3/uL (ref 150–399)
WBC: 6.9 10*3/mL

## 2013-07-29 LAB — LIPID PANEL
CHOLESTEROL: 132 mg/dL (ref 0–200)
HDL: 42 mg/dL (ref 35–70)
LDL CALC: 67 mg/dL
Triglycerides: 158 mg/dL (ref 40–160)

## 2013-07-29 LAB — TSH: TSH: 1.34 u[IU]/mL (ref 0.41–5.90)

## 2013-08-04 DIAGNOSIS — R339 Retention of urine, unspecified: Secondary | ICD-10-CM | POA: Insufficient documentation

## 2013-08-24 ENCOUNTER — Other Ambulatory Visit: Payer: Self-pay | Admitting: Internal Medicine

## 2013-08-24 NOTE — Telephone Encounter (Signed)
Ok to refill 

## 2013-08-24 NOTE — Telephone Encounter (Signed)
Pt came in today needing a refill on his hydrocodone /acetaminophen 5-325 tb Pt has 5- 6 pills left Please advise pt when he can pick up rx

## 2013-08-26 MED ORDER — HYDROCODONE-ACETAMINOPHEN 5-325 MG PO TABS: 1.0000 | ORAL_TABLET | Freq: Four times a day (QID) | ORAL | Status: DC | PRN

## 2013-08-26 NOTE — Telephone Encounter (Signed)
Pt notified Rx ready for pickup 

## 2013-08-26 NOTE — Telephone Encounter (Signed)
Today,  Refill signed

## 2013-09-03 ENCOUNTER — Other Ambulatory Visit: Payer: Self-pay | Admitting: Internal Medicine

## 2013-09-20 ENCOUNTER — Other Ambulatory Visit: Payer: Self-pay | Admitting: Internal Medicine

## 2013-09-20 NOTE — Telephone Encounter (Signed)
Last visit 05/31/13, ok to refill?

## 2013-09-20 NOTE — Telephone Encounter (Signed)
Pt is needing refill on Hydrocodone 5-325 tablets. Please call pt when ready at [phone].

## 2013-09-21 MED ORDER — HYDROCODONE-ACETAMINOPHEN 5-325 MG PO TABS: 1.0000 | ORAL_TABLET | Freq: Four times a day (QID) | ORAL | Status: DC | PRN

## 2013-09-21 NOTE — Telephone Encounter (Signed)
Notified pt Rx ready for pick up 

## 2013-10-21 ENCOUNTER — Other Ambulatory Visit: Payer: Self-pay | Admitting: Internal Medicine

## 2013-10-22 ENCOUNTER — Other Ambulatory Visit: Payer: Self-pay | Admitting: Internal Medicine

## 2013-10-22 MED ORDER — HYDROCODONE-ACETAMINOPHEN 5-325 MG PO TABS: 1.0000 | ORAL_TABLET | Freq: Four times a day (QID) | ORAL | Status: DC | PRN

## 2013-10-22 NOTE — Telephone Encounter (Signed)
Came into office.  Asking for refill of hydrocodone/acetaminophen 5-325 q6h as needed.  Asking to call [phone] when ready.

## 2013-10-22 NOTE — Telephone Encounter (Signed)
Ok to refill,  printed rx . Please ask him to make an appt for follow up on DM

## 2013-10-22 NOTE — Telephone Encounter (Signed)
Ok refill? 

## 2013-10-25 LAB — HM DIABETES EYE EXAM

## 2013-10-25 NOTE — Telephone Encounter (Signed)
Patient notified as instructed by telephone and that script is up front and ready for pickup.

## 2013-11-02 ENCOUNTER — Encounter: Payer: Self-pay | Admitting: Internal Medicine

## 2013-11-02 ENCOUNTER — Ambulatory Visit (INDEPENDENT_AMBULATORY_CARE_PROVIDER_SITE_OTHER): Payer: Medicare Other | Admitting: Internal Medicine

## 2013-11-02 ENCOUNTER — Telehealth: Payer: Self-pay | Admitting: Internal Medicine

## 2013-11-02 ENCOUNTER — Encounter (INDEPENDENT_AMBULATORY_CARE_PROVIDER_SITE_OTHER): Payer: Self-pay

## 2013-11-02 VITALS — BP 148/72 | HR 90 | Temp 98.2°F | Resp 14 | Wt 202.0 lb

## 2013-11-02 DIAGNOSIS — I2510 Atherosclerotic heart disease of native coronary artery without angina pectoris: Secondary | ICD-10-CM

## 2013-11-02 DIAGNOSIS — E559 Vitamin D deficiency, unspecified: Secondary | ICD-10-CM

## 2013-11-02 DIAGNOSIS — Z634 Disappearance and death of family member: Secondary | ICD-10-CM

## 2013-11-02 DIAGNOSIS — IMO0001 Reserved for inherently not codable concepts without codable children: Secondary | ICD-10-CM

## 2013-11-02 DIAGNOSIS — F4321 Adjustment disorder with depressed mood: Secondary | ICD-10-CM

## 2013-11-02 DIAGNOSIS — E785 Hyperlipidemia, unspecified: Secondary | ICD-10-CM

## 2013-11-02 DIAGNOSIS — E1165 Type 2 diabetes mellitus with hyperglycemia: Secondary | ICD-10-CM

## 2013-11-02 DIAGNOSIS — Z733 Stress, not elsewhere classified: Secondary | ICD-10-CM

## 2013-11-02 DIAGNOSIS — IMO0002 Reserved for concepts with insufficient information to code with codable children: Secondary | ICD-10-CM

## 2013-11-02 DIAGNOSIS — E1159 Type 2 diabetes mellitus with other circulatory complications: Secondary | ICD-10-CM

## 2013-11-02 LAB — HM DIABETES FOOT EXAM: HM Diabetic Foot Exam: DECREASED

## 2013-11-02 MED ORDER — METFORMIN HCL 1000 MG PO TABS: ORAL_TABLET | ORAL | Status: DC

## 2013-11-02 NOTE — Progress Notes (Signed)
Pre-visit discussion using our clinic review tool. No additional management support is needed unless otherwise documented below in the visit note.  

## 2013-11-02 NOTE — Patient Instructions (Signed)
Your diabetes has gotten out of control because you are eating too many hi sugar fruits and cereals and too many starches.   This is my version of a  "Low GI"  Diet:  It will still lower your blood sugars and allow you to lose 4 to 8  lbs  per month if you follow it carefully.  Your goal with exercise is a minimum of 30 minutes of aerobic exercise 5 days per week (Walking does not count once it becomes easy!)    All of the foods can be found at grocery stores and in bulk at [employer].  The Atkins protein bars and shakes are available in more varieties at [employer], [employer] and [place].     7 AM Breakfast:  Choose from the following:  Low carbohydrate Protein  Shakes (I recommend the EAS AdvantEdge "Carb Control" shakes  Or the low carb shakes by Atkins.    2.5 carbs   Arnold's "Sandwhich Thin"toasted  w/ peanut butter (no jelly: about 20 net carbs  "Bagel Thin" with cream cheese and [place]: about 20 carbs   a scrambled egg/bacon/cheese burrito made with Mission's "carb balance" whole wheat tortilla  (about 10 net carbs )   Avoid cereal and bananas, oatmeal and cream of wheat and grits. They are loaded with carbohydrates!   10 AM: high protein snack  Protein bar by Atkins (the snack size, under 200 cal, usually < 6 net carbs).    A stick of cheese:  Around 1 carb,  100 cal     Dannon Light n Fit [place] Yogurt  (80 cal, 8 carbs)  Other so called "protein bars" and Greek yogurts tend to be loaded with carbohydrates.  Remember, in food advertising, the word "energy" is synonymous for " carbohydrate."  Lunch:   A Sandwich using the bread choices listed, Can use any  Eggs,  lunchmeat, grilled meat or canned tuna), avocado, regular mayo/mustard  and cheese.  A Salad using blue cheese, ranch,  Goddess or vinagrette,  No croutons or "confetti" and no "candied nuts" but regular nuts OK.   No pretzels or chips.  Pickles and miniature sweet peppers are a good low carb alternative that provide a  "crunch"  The bread is the only source of carbohydrate in a sandwich and  can be decreased by trying some of these alternatives to traditional loaf bread  Joseph's makes a pita bread and a flat bread that are 50 cal and 4 net carbs available at [place] and [place].  This can be toasted to use with hummous as well  Toufayan makes a low carb flatbread that's 100 cal and 9 net carbs available at [employer] and [employer]'s makes 2 sizes of  Low carb whole wheat tortilla  (The large one is 210 cal and 6 net carbs) Avoid "Low fat dressings, as well as Barry Brunner and [employer] dressings They are loaded with sugar!   3 PM/ Mid day  Snack:  Consider  1 ounce of  almonds, walnuts, pistachios, pecans, peanuts,  Macadamia nuts or a nut medley.  Avoid "granola"; the dried cranberries and raisins are loaded with carbohydrates. Mixed nuts as long as there are no raisins,  cranberries or dried fruit.     6 PM  Dinner:     Meat/fowl/fish with a green salad, and either broccoli, cauliflower, green beans, spinach, brussel sprouts or  Lima beans. DO NOT BREAD THE PROTEIN!!      There is  a low carb pasta by Dreamfield's that is acceptable and tastes great: only 5 digestible carbs/serving.( All grocery stores but BJs carry it )  Try Hurley Cisco Angelo's chicken piccata or chicken or eggplant parm over low carb pasta.(Lowes and BJs)   Marjory Lies Sanchez's "Carnitas" (pulled pork, no sauce,  0 carbs) or his beef pot roast to make a dinner burrito (at [employer]J's)  Pesto over low carb pasta (bj's sells a good quality pesto in the center refrigerated section of the deli   Whole wheat pasta is still full of digestible carbs and  Not as low in glycemic index as Dreamfield's.   Brown rice is still rice,  So skip the rice and noodles if you eat [place] or [place] (or at least limit to 1/2 cup)  9 PM snack :   Breyer's "low carb" fudgsicle or  ice cream bar (Carb Smart line), or  Weight Watcher's ice cream bar , or another "no sugar added"  ice cream;  a serving of fresh berries/cherries with whipped cream   Cheese or DANNON'S LlGHT N [place] YOGURT  Avoid bananas, pineapple, grapes  and watermelon on a regular basis because they are high in sugar.  THINK OF THEM AS DESSERT  Remember that snack Substitutions should be less than 10 NET carbs per serving and meals < 20 carbs. Remember to subtract fiber grams to get the "net carbs."

## 2013-11-02 NOTE — Telephone Encounter (Signed)
Pt states he was to have received samples of humalog 75/25 at appt 1/13.  Asking if we can get it ready and he will come back by to pick up.

## 2013-11-02 NOTE — Progress Notes (Addendum)
Patient ID: Henry Lindsey, male   DOB: [birth year], 80 y.o.   MRN: [id number]  Patient Active Problem List   Diagnosis Date Noted  . Impotence due to erectile dysfunction 06/01/2013  . Routine general medical examination at a health care facility 04/04/2013  . CAD (coronary artery disease) 07/21/2012  . Peripheral vascular disease in diabetes mellitus   . Occlusion and stenosis of carotid artery with cerebral infarction   . PAD (peripheral artery disease) 07/01/2012  . Bereavement due to life event 04/23/2012  . Basal Cell Carcinoma Of Cheek 08/05/2011  . 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 Diabetes mellitus type II, uncontrolled 08/05/2011  . Hyperlipidemia   . Hypertension   . CVA (cerebral infarction)     Subjective:  CC:   Chief Complaint  Patient presents with  . Follow-up  . Diabetes    HPI:   Henry Lindsey a 80 y.o. male who presents for follow up on uncontrolled DM.  Last seen August  2014 at which time he was asked to return in one month for diabetes follow up since his A1c had risen to  7.8 .  He did not return ,  He had a physical at the [facility] hospital in October and his A1c was 9.1.   He has not been checking his sugars regularly   But reports from memory that all sugars have been < 150  He is taking 12 units of the Humalog  mixed 75/25 insulin  twice daily but is taking the insulin  After  Meals  iinstead of before them. The 75/25 is expensive right now bc he has not met his deductible.   He has had his [facility] exam at the [facility]   Past Medical History  Diagnosis Date  . Diabetes mellitus   . Neuromuscular disorder   . Hyperlipidemia   . Hypertension   . CVA (cerebral infarction) feb 2012    r thalamic lacunar  . Peripheral vascular disease in diabetes mellitus Sept 2013     95% occlusion s/p PTCA R SFA Dew Sept 2013  . Carotid artery stenosis Feb 2012    <50% bilaterally    Past Surgical History  Procedure Laterality Date  . Upper gastrointestinal endoscopy          The following portions of the patient's history were reviewed and updated as appropriate: Allergies, current medications, and problem list.    Review of Systems:   12 Pt  review of systems was negative except those addressed in the HPI,     History   Social History  . Marital Status: Married    Spouse Name: N/A    Number of Children: N/A  . Years of Education: N/A   Occupational History  . Not on file.   Social History Main Topics  . Smoking status: Former Smoker    Quit date: 04/21/1968  . Smokeless tobacco: Never Used     Comment: quit 1969  . Alcohol Use: No  . Drug Use: No  . Sexual Activity: Not on file   Other Topics Concern  . Not on file   Social History Narrative  . No narrative on file    Objective:  Filed Vitals:   11/02/13 1526  BP: 148/72  Pulse: 90  Temp: 98.2 F (36.8 C)  Resp: 14     General appearance: alert, cooperative and appears stated age Ears: normal TM's and external ear canals both ears Throat: lips, mucosa, and tongue normal; teeth  and gums normal Neck: no adenopathy, no carotid bruit, supple, symmetrical, trachea midline and thyroid not enlarged, symmetric, no tenderness/mass/nodules Back: symmetric, no curvature. ROM normal. No CVA tenderness. Lungs: clear to auscultation bilaterally Heart: regular rate and rhythm, S1, S2 normal, no murmur, click, rub or gallop Abdomen: soft, non-tender; bowel sounds normal; no masses,  no organomegaly Pulses: 2+ and symmetric Skin: Skin color, texture, turgor normal. No rashes or lesions Lymph nodes: Cervical, supraclavicular, and axillary nodes normal.  Assessment and Plan:  Type II or unspecified type diabetes mellitus with peripheral circulatory disorders, uncontrolled(250.72) Secondary to dietary noncompliance and resistance to checking sugars regularly.   I stopped the lunchtime dose last August.I have advised him to take his second dose before his evening meal and to keep  a log of his blood sugars and return for repeat evaluation in one month. He is taking the 70/30 insulin inappropriately, after meals instead of before. He states that he is had his diabetic eye exam  At the [facility] was 9.1  In October and is now 9.7  Diet reviewed and hand out given,  Referral to diabetes education seminars is underway.Increase dose to 15 unis bid of 75/25   Hyperlipidemia Well controlled on current statin  aND FENOFIBRATE therapy.   Liver enzymes are normal , no changes today.  Lab Results  Component Value Date   CHOL 132 07/29/2013   HDL 42 07/29/2013   LDLCALC 67 07/29/2013   LDLDIRECT 67.8 02/15/2013   TRIG 158 07/29/2013   CHOLHDL 3 05/24/2013   Lab Results  Component Value Date   ALT 29 11/02/2013   AST 26 11/02/2013   ALKPHOS 47 11/02/2013   BILITOT 0.6 11/02/2013    CAD (coronary artery disease) Presumed, given the extent of his PAD and abnomal EKG.  He deferred pharmacologic myoview recommended vy cardiology Oct 2013 pre procedure. .  Will recommend resuming  baby aspirin daily to regimen.   Bereavement due to life event He has recovered well from the death of his wife and is now dating and socializing.    Unspecified vitamin D deficiency Level was 29 in Oct by [facility].  Will recommend Vit D3 2000 units daily .   Updated Medication List Outpatient Encounter Prescriptions as of 11/02/2013  Medication Sig  . amLODipine (NORVASC) 10 MG tablet TAKE 1 TABLET BY MOUTH EVERY DAY  . atorvastatin (LIPITOR) 40 MG tablet TAKE 1 TABLET BY MOUTH EVERY DAY  . fenofibrate micronized (LOFIBRA) 134 MG capsule TAKE 1 CAPSULE BY MOUTH EVERY DAY BEFORE BREAKFAST  . glipiZIDE (GLUCOTROL) 10 MG tablet TAKE 1 TABLET BY MOUTH TWICE DAILY  . glucose blood (BAYER CONTOUR TEST) test strip Use as instructed  . HUMULIN R 100 UNIT/ML injection INJECT 10 UNITS UNDER THE SKIN THREE TIMES DAILY BEFORE MEALS  . HYDROcodone-acetaminophen (NORCO/VICODIN) 5-325 MG per tablet Take 1  tablet by mouth every 6 (six) hours as needed.  . insulin lispro protamine-lispro (HUMALOG MIX 75/25) (75-25) 100 UNIT/ML SUSP injection INJECT 15 UNITS UNDER THE SKIN BEFORE BREAKFAST AND 15 UNITS BEFORE DINNER  . INSULIN SYRINGE .5CC/29G 29G X 1/2" 0.5 ML MISC 1 Syringe by Does not apply route 3 (three) times daily.  . Insulin Syringe-Needle U-100 (INSULIN SYRINGE .5CC/31GX5/16") 31G X 5/16" 0.5 ML MISC USE THREE TIMES DAILY  . losartan (COZAAR) 100 MG tablet TAKE 1 TABLET BY MOUTH EVERY DAY  . metFORMIN (GLUCOPHAGE) 1000 MG tablet TAKE 1 TABLET BY MOUTH TWICE DAILY  .  metoprolol succinate (TOPROL-XL) 100 MG 24 hr tablet TAKE 1 TABLET BY MOUTH ONCE DAILY  . [DISCONTINUED] HUMALOG MIX 75/25 (75-25) 100 UNIT/ML SUSP injection INJECT 15 UNITS UNDER THE SKIN BEFORE BREAKFAST AND 17 UNITS BEFORE DINNER  . [DISCONTINUED] metFORMIN (GLUCOPHAGE) 1000 MG tablet TAKE 1 TABLET BY MOUTH TWICE DAILY  . aspirin EC 81 MG tablet Take 1 tablet (81 mg total) by mouth daily.  . [DISCONTINUED] Avanafil (STENDRA) 50 MG TABS Take 1 tablet by mouth daily.  . [DISCONTINUED] Avanafil (STENDRA) 50 MG TABS Take 1 tablet by mouth daily.  . [DISCONTINUED] cilostazol (PLETAL) 100 MG tablet Take 100 mg by mouth 2 (two) times daily.

## 2013-11-03 ENCOUNTER — Telehealth: Payer: Self-pay

## 2013-11-03 ENCOUNTER — Encounter: Payer: Self-pay | Admitting: Internal Medicine

## 2013-11-03 DIAGNOSIS — E559 Vitamin D deficiency, unspecified: Secondary | ICD-10-CM | POA: Insufficient documentation

## 2013-11-03 LAB — COMPREHENSIVE METABOLIC PANEL
ALBUMIN: 4.1 g/dL (ref 3.5–5.2)
ALK PHOS: 47 U/L (ref 39–117)
ALT: 29 U/L (ref 0–53)
AST: 26 U/L (ref 0–37)
BUN: 23 mg/dL (ref 6–23)
CO2: 28 mEq/L (ref 19–32)
Calcium: 9.2 mg/dL (ref 8.4–10.5)
Chloride: 102 mEq/L (ref 96–112)
Creatinine, Ser: 1.6 mg/dL — ABNORMAL HIGH (ref 0.4–1.5)
GFR: 43.65 mL/min — ABNORMAL LOW (ref >60.00)
Glucose, Bld: 377 mg/dL — ABNORMAL HIGH (ref 70–99)
POTASSIUM: 4.8 meq/L (ref 3.5–5.1)
SODIUM: 137 meq/L (ref 135–145)
Total Bilirubin: 0.6 mg/dL (ref 0.3–1.2)
Total Protein: 7.2 g/dL (ref 6.0–8.3)

## 2013-11-03 LAB — HEMOGLOBIN A1C
Hgb A1c MFr Bld: 9.7 % — AB (ref 4.0–6.0)
Hgb A1c MFr Bld: 9.7 % — ABNORMAL HIGH (ref 4.6–6.5)

## 2013-11-03 LAB — MICROALBUMIN / CREATININE URINE RATIO
CREATININE, U: 56.1 mg/dL
MICROALB UR: 0.7 mg/dL (ref 0.0–1.9)
Microalb Creat Ratio: 1.2 mg/g (ref 0.0–30.0)

## 2013-11-03 MED ORDER — ASPIRIN EC 81 MG PO TBEC: 81.0000 mg | DELAYED_RELEASE_TABLET | Freq: Every day | ORAL | Status: DC

## 2013-11-03 MED ORDER — INSULIN LISPRO PROT & LISPRO (75-25 MIX) 100 UNIT/ML ~~LOC~~ SUSP: SUBCUTANEOUS | Status: DC

## 2013-11-03 NOTE — Assessment & Plan Note (Addendum)
Secondary to dietary noncompliance and resistance to checking sugars regularly.   I stopped the lunchtime dose last August.I have advised him to take his second dose before his evening meal and to keep a log of his blood sugars and return for repeat evaluation in one month. He is taking the 70/30 insulin inappropriately, after meals instead of before. He states that he is had his diabetic eye exam  At the [facility] was 9.1  In October and is now 9.7  Diet reviewed and hand out given,  Referral to diabetes education seminars is underway.Increase dose to 15 unis bid of 75/25

## 2013-11-03 NOTE — Assessment & Plan Note (Signed)
Level was 29 in Oct by [facility].  Will recommend Vit D3 2000 units daily .

## 2013-11-03 NOTE — Assessment & Plan Note (Signed)
Well controlled on current statin  aND FENOFIBRATE therapy.   Liver enzymes are normal , no changes today.  Lab Results  Component Value Date   CHOL 132 07/29/2013   HDL 42 07/29/2013   LDLCALC 67 07/29/2013   LDLDIRECT 67.8 02/15/2013   TRIG 158 07/29/2013   CHOLHDL 3 05/24/2013   Lab Results  Component Value Date   ALT 29 11/02/2013   AST 26 11/02/2013   ALKPHOS 47 11/02/2013   BILITOT 0.6 11/02/2013

## 2013-11-03 NOTE — Progress Notes (Signed)
Patient ID: Henry Lindsey, male   DOB: Jul 15, 1933, 80 y.o.   MRN: [id number]

## 2013-11-03 NOTE — Addendum Note (Signed)
Addended by: [name] on: 11/03/2013 12:30 PM   Modules accepted: Orders

## 2013-11-03 NOTE — Assessment & Plan Note (Addendum)
Presumed, given the extent of his PAD and abnomal EKG.  He deferred pharmacologic myoview recommended vy cardiology Oct 2013 pre procedure. .  Will recommend resuming  baby aspirin daily to regimen.

## 2013-11-03 NOTE — Assessment & Plan Note (Signed)
He has recovered well from the death of his wife and is now dating and socializing.

## 2013-11-03 NOTE — Telephone Encounter (Signed)
Relevant patient education mailed to patient.  

## 2013-11-04 NOTE — Telephone Encounter (Signed)
No samples patient notified.

## 2013-11-05 ENCOUNTER — Encounter: Payer: Self-pay | Admitting: *Deleted

## 2013-11-18 ENCOUNTER — Other Ambulatory Visit: Payer: Self-pay | Admitting: Internal Medicine

## 2013-11-19 ENCOUNTER — Telehealth: Payer: Self-pay | Admitting: Internal Medicine

## 2013-11-19 MED ORDER — HYDROCODONE-ACETAMINOPHEN 5-325 MG PO TABS: 1.0000 | ORAL_TABLET | Freq: Four times a day (QID) | ORAL | Status: DC | PRN

## 2013-11-19 NOTE — Telephone Encounter (Signed)
Refilled.  Can pick up today or Monday (not due for refill till feb 1)

## 2013-11-19 NOTE — Telephone Encounter (Signed)
Pt came in today needing to get a refill on hydrocondone/acetaminophen 5-325tb

## 2013-11-22 NOTE — Telephone Encounter (Signed)
Pt notified Rx ready for pickup 

## 2013-11-29 ENCOUNTER — Ambulatory Visit (INDEPENDENT_AMBULATORY_CARE_PROVIDER_SITE_OTHER): Payer: Medicare Other | Admitting: Internal Medicine

## 2013-11-29 ENCOUNTER — Encounter: Payer: Self-pay | Admitting: Internal Medicine

## 2013-11-29 VITALS — BP 148/78 | HR 85 | Temp 97.8°F | Resp 16 | Wt 200.5 lb

## 2013-11-29 DIAGNOSIS — E663 Overweight: Secondary | ICD-10-CM

## 2013-11-29 DIAGNOSIS — E1165 Type 2 diabetes mellitus with hyperglycemia: Secondary | ICD-10-CM

## 2013-11-29 DIAGNOSIS — E1159 Type 2 diabetes mellitus with other circulatory complications: Secondary | ICD-10-CM

## 2013-11-29 DIAGNOSIS — IMO0001 Reserved for inherently not codable concepts without codable children: Secondary | ICD-10-CM

## 2013-11-29 DIAGNOSIS — E1149 Type 2 diabetes mellitus with other diabetic neurological complication: Secondary | ICD-10-CM

## 2013-11-29 DIAGNOSIS — E1140 Type 2 diabetes mellitus with diabetic neuropathy, unspecified: Secondary | ICD-10-CM

## 2013-11-29 DIAGNOSIS — E1142 Type 2 diabetes mellitus with diabetic polyneuropathy: Secondary | ICD-10-CM

## 2013-11-29 DIAGNOSIS — IMO0002 Reserved for concepts with insufficient information to code with codable children: Secondary | ICD-10-CM

## 2013-11-29 DIAGNOSIS — I872 Venous insufficiency (chronic) (peripheral): Secondary | ICD-10-CM

## 2013-11-29 MED ORDER — GABAPENTIN 100 MG PO CAPS: 100.0000 mg | ORAL_CAPSULE | Freq: Three times a day (TID) | ORAL | Status: DC

## 2013-11-29 MED ORDER — INSULIN LISPRO PROT & LISPRO (75-25 MIX) 100 UNIT/ML ~~LOC~~ SUSP: SUBCUTANEOUS | Status: DC

## 2013-11-29 NOTE — Progress Notes (Signed)
Patient ID: Henry Lindsey, male   DOB: February 09, 1933, 80 y.o.   MRN: [id number]  Patient Active Problem List   Diagnosis Date Noted  . Overweight (BMI 25.0-29.9) 11/30/2013  . Unspecified vitamin D deficiency 11/03/2013  . Impotence due to erectile dysfunction 06/01/2013  . Routine general medical examination at a health care facility 04/04/2013  . CAD (coronary artery disease) 07/21/2012  . Peripheral vascular disease in diabetes mellitus   . Occlusion and stenosis of carotid artery with cerebral infarction   . PAD (peripheral artery disease) 07/01/2012  . Basal Cell Carcinoma Of Cheek 08/05/2011  . 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 Type II or unspecified type diabetes mellitus with peripheral circulatory disorders, uncontrolled(250.72) 08/05/2011  . Hyperlipidemia   . Hypertension   . CVA (cerebral infarction)     Subjective:  CC:   Chief Complaint  Patient presents with  . Follow-up    Diabetes  CBG,s     HPI:   Henry Lindsey is a 80 y.o. male who presents for  One month follow up on uncontrolled DM  With most recent A1c as of jan 2015  9.7 .  He has brought a log of BS from the last month along with a food diary   Fastings range from 138 to 180 for the past week,  Post lunch  Sugars range from 95 to 195.  Post dinner 166 to 195.  Giving himself insulin twice daily 15 units of mixed insulin (75/25 currently) before  breakfast and dinner   Had a low blood sugar of 75 after eating celery and pimento cheese for lunch and an egg based breakfast.  Alternates between eggs/toast  And Special K cereal with red berries/milk  Diet includes cereal 3/week and some form of bread averaging twice daily .  No potatoes or rice.  Grilled meat/fowlfish for dinner with salad and vegetables.   He has resumed a light exercise program but is limited by persistent knee pain despite total knee replacemenbt last year.     Past Medical History  Diagnosis Date  . Diabetes mellitus   . Neuromuscular  disorder   . Hyperlipidemia   . Hypertension   . CVA (cerebral infarction) feb 2012    r thalamic lacunar  . Peripheral vascular disease in diabetes mellitus Sept 2013     95% occlusion s/p PTCA R SFA Dew Sept 2013  . Carotid artery stenosis Feb 2012    <50% bilaterally    Past Surgical History  Procedure Laterality Date  . Upper gastrointestinal endoscopy    . Joint replacement Left 2014    left knee       The following portions of the patient's history were reviewed and updated as appropriate: Allergies, current medications, and problem list.    Review of Systems:   Patient denies headache, fevers, malaise, unintentional weight loss, skin rash, eye pain, sinus congestion and sinus pain, sore throat, dysphagia,  hemoptysis , cough, dyspnea, wheezing, chest pain, palpitations, orthopnea, edema, abdominal pain, nausea, melena, diarrhea, constipation, flank pain, dysuria, hematuria, urinary  Frequency, nocturia, numbness, tingling, seizures,  Focal weakness, Loss of consciousness,  Tremor, insomnia, depression, anxiety, and suicidal ideation.     History   Social History  . Marital Status: Married    Spouse Name: Henry Lindsey, deceased    Number of Children: 2  . Years of Education: 12   Occupational History  .     Social History Main Topics  . Smoking status: Former Smoker  Quit date: 04/21/1968  . Smokeless tobacco: Never Used     Comment: quit 1969  . Alcohol Use: No  . Drug Use: No  . Sexual Activity: Yes    Partners: Female   Other Topics Concern  . Not on file   Social History Narrative   Widowed in 2014; dating again     Objective:  Filed Vitals:   11/29/13 1526  BP: 148/78  Pulse: 85  Temp: 97.8 F (36.6 C)  Resp: 16     General appearance: alert, cooperative and appears stated age Ears: normal TM's and external ear canals both ears Throat: lips, mucosa, and tongue normal; teeth and gums normal Neck: no adenopathy, no carotid bruit, supple,  symmetrical, trachea midline and thyroid not enlarged, symmetric, no tenderness/mass/nodules Back: symmetric, no curvature. ROM normal. No CVA tenderness. Lungs: clear to auscultation bilaterally Heart: regular rate and rhythm, S1, S2 normal, no murmur, click, rub or gallop Abdomen: soft, non-tender; bowel sounds normal; no masses,  no organomegaly Pulses: 2+ and symmetric Skin: Skin color, texture, turgor normal. No rashes or lesions Lymph nodes: Cervical, supraclavicular, and axillary nodes normal.  Assessment and Plan:  Type II or unspecified type diabetes mellitus with peripheral circulatory disorders, uncontrolled(250.72) Diet and BS reviewed,  Advised to increase evening insulin dowse to 18 units daily,  Advised to reduce mornign dose from 15 to 12 if he is eating eggs and continue 15 units for cereal breakfast.  Foot exam done.  Return in one month  Overweight (BMI 25.0-29.9) I have addressed  BMI and recommended wt loss of 10% of body weight over the next 6 months using a low glycemic index diet and regular exercise a minimum of 5 days per week.    A total of 30 minutes was spent with patient more than half of which was spent in counseling, reviewing records and coordination of care.  Updated Medication List Outpatient Encounter Prescriptions as of 11/29/2013  Medication Sig  . amLODipine (NORVASC) 10 MG tablet TAKE 1 TABLET BY MOUTH EVERY DAY  . aspirin EC 81 MG tablet Take 1 tablet (81 mg total) by mouth daily.  [name] atorvastatin (LIPITOR) 40 MG tablet TAKE 1 TABLET BY MOUTH EVERY DAY  . fenofibrate micronized (LOFIBRA) 134 MG capsule TAKE 1 CAPSULE BY MOUTH EVERY MORNING BEFORE BREAKFAST  . glipiZIDE (GLUCOTROL) 10 MG tablet TAKE 1 TABLET BY MOUTH TWICE DAILY  . glucose blood (BAYER CONTOUR TEST) test strip Use as instructed  . HUMULIN R 100 UNIT/ML injection INJECT 10 UNITS UNDER THE SKIN THREE TIMES DAILY BEFORE MEALS  . HYDROcodone-acetaminophen (NORCO/VICODIN) 5-325 MG per  tablet Take 1 tablet by mouth every 6 (six) hours as needed.  . insulin lispro protamine-lispro (HUMALOG MIX 75/25) (75-25) 100 UNIT/ML SUSP injection INJECT 15 UNITS UNDER THE SKIN BEFORE BREAKFAST AND 18 UNITS BEFORE DINNER  . INSULIN SYRINGE .5CC/29G 29G X 1/2" 0.5 ML MISC 1 Syringe by Does not apply route 3 (three) times daily.  . Insulin Syringe-Needle U-100 (INSULIN SYRINGE .5CC/31GX5/16") 31G X 5/16" 0.5 ML MISC USE THREE TIMES DAILY  . losartan (COZAAR) 100 MG tablet TAKE 1 TABLET BY MOUTH EVERY DAY  . metFORMIN (GLUCOPHAGE) 1000 MG tablet TAKE 1 TABLET BY MOUTH TWICE DAILY  . metoprolol succinate (TOPROL-XL) 100 MG 24 hr tablet TAKE 1 TABLET BY MOUTH ONCE DAILY  . tamsulosin (FLOMAX) 0.4 MG CAPS capsule Take 0.4 mg by mouth daily after breakfast.  . traMADol (ULTRAM) 50 MG tablet   . [DISCONTINUED]  insulin lispro protamine-lispro (HUMALOG MIX 75/25) (75-25) 100 UNIT/ML SUSP injection INJECT 15 UNITS UNDER THE SKIN BEFORE BREAKFAST AND 15 UNITS BEFORE DINNER  . gabapentin (NEURONTIN) 100 MG capsule Take 1 capsule (100 mg total) by mouth 3 (three) times daily.     No orders of the defined types were placed in this encounter.    Return in about 4 weeks (around 12/27/2013).

## 2013-11-29 NOTE — Progress Notes (Signed)
Pre-visit discussion using our clinic review tool. No additional management support is needed unless otherwise documented below in the visit note.  

## 2013-11-29 NOTE — Patient Instructions (Addendum)
On the days you eat eggs for breakfast ,  Reduce the morning insulin to 12 units.  Continue 15 units before a breakfast of cereal.    Increase your evening dose of insulin to 18 units.   Your fastings should be 90 to 120 ideally  You can increase the evening dose of insulin every 3 days until your fastings get in line. (90 to 12)   Trial of gabapentin (100 mg up to three times daily)  for your diabetic neuropathy  Your leg swelling is due to venous insufficiency.  You should wear compression socks daily to keep the swelling down.   Diabetic Neuropathy Diabetic neuropathy is a nerve disease or nerve damage that is caused by diabetes mellitus. About half of all people with diabetes mellitus have some form of nerve damage. Nerve damage is more common in those who have had diabetes mellitus for many years and who generally have not had good control of their blood sugar (glucose) level. Diabetic neuropathy is a common complication of diabetes mellitus. There are three more common types of diabetic neuropathy and a fourth type that is less common and less understood:   Peripheral neuropathy This is the most common type of diabetic neuropathy. It causes damage to the nerves of the feet and legs first and then eventually the hands and arms.The damage affects the ability to sense touch.  Autonomic neuropathy This type causes damage to the autonomic nervous system, which controls the following functions:  Heartbeat.  Body temperature.  Blood pressure.  Urination.  Digestion.  Sweating.  Sexual function.  Focal neuropathy Focal neuropathy can be painful and unpredictable and occurs most often in older adults with diabetes mellitus. It involves a specific nerve or one area and often comes on suddenly. It usually does not cause long-term problems.  Radiculoplexus neuropathy Sometimes called lumbosacral radiculoplexus neuropathy, radiculoplexus neuropathy affects the nerves of the thighs,  hips, buttocks, or legs. It is more common in people with type 2 diabetes mellitus and in older men. It is characterized by debilitating pain, weakness, and atrophy, usually in the thigh muscles. CAUSES  The cause of peripheral, autonomic, and focal neuropathies is diabetes mellitus that is uncontrolled and high glucose levels. The cause of radiculoplexus neuropathy is unknown. However, it is thought to be caused by inflammation related to uncontrolled glucose levels. SIGNS AND SYMPTOMS  Peripheral Neuropathy Peripheral neuropathy develops slowly over time. When the nerves of the feet and legs no longer work there may be:   Burning, stabbing, or aching pain in the legs or feet.  Inability to feel pressure or pain in your feet. This can lead to:  Thick calluses over pressure areas.  Pressure sores.  Ulcers.  Foot deformities.  Reduced ability to feel temperature changes.  Muscle weakness. Autonomic Neuropathy The symptoms of autonomic neuropathy vary depending on which nerves are affected. Symptoms may include:  Problems with digestion, such as:  Feeling sick to your stomach (nausea).  Vomiting.  Bloating.  Constipation.  Diarrhea.  Abdominal pain.  Difficulty with urination. This occurs if you lose your ability to sense when your bladder is full. Problems include:  Urine leakage (incontinence).  Inability to empty your bladder completely (retention).  Rapid or irregular heartbeat (palpitations).  Blood pressure drops when you stand up (orthostatic hypotension). When you stand up you may feel:  Dizzy.  Weak.  Faint.  In men, inability to attain and maintain an erection.  In women, vaginal dryness and problems with decreased sexual  desire and arousal.  Problems with body temperature regulation.  Increased or decreased sweating. Focal Neuropathy  Abnormal eye movements or abnormal alignment of both eyes.  Weakness in the wrist.  Foot drop. This results  in an inability to lift the foot properly and abnormal walking or foot movement.  Paralysis on one side of your face (Bell palsy).  Chest or abdominal pain. Radiculoplexus Neuropathy  Sudden, severe pain in your hip, thigh, or buttocks.  Weakness and wasting of thigh muscles.  Difficulty rising from a seated position.  Abdominal swelling.  Unexplained weight loss (usually more than 10 lb [4.5 kg]). DIAGNOSIS  Peripheral Neuropathy Your senses may be tested. Sensory function testing can be done with:  A light touch using a monofilament.  A vibration with tuning fork.  A sharp sensation with a pin prick. Other tests that can help diagnose neuropathy are:  Nerve conduction velocity. This test checks the transmission of an electrical current through a nerve.  Electromyography. This shows how muscles respond to electrical signals transmitted by nearby nerves.  Quantitative sensory testing. This is used to assess how your nerves respond to vibrations and changes in temperature. Autonomic Neuropathy Diagnosis is often based on reported symptoms. Tell your health care provider if you experience:   Dizziness.   Constipation.   Diarrhea.   Inappropriate urination or inability to urinate.   Inability to get or maintain an erection.  Tests that may be done include:   Electrocardiography or Holter monitor. These are tests that can help show problems with the heart rate or heart rhythm.   An X-ray exam may be done. Focal Neuropathy Diagnosis is made based on your symptoms and what your health care provider finds during your exam. Other tests may be done. They may include:  Nerve conduction velocities. This checks the transmission of electrical current through a nerve.  Electromyography. This shows how muscles respond to electrical signals transmitted by nearby nerves.  Quantitative sensory testing. This test is used to assess how your nerves respond to vibration and  changes in temperature. Radiculoplexus Neuropathy  Often the first thing is to eliminate any other issue or problems that might be the cause, as there is no stick test for diagnosis.  X-ray exam of your spine and lumbar region.  Spinal tap to rule out cancer.  MRI to rule out other lesions. TREATMENT  Once nerve damage occurs, it cannot be reversed. The goal of treatment is to keep the disease or nerve damage from getting worse and affecting more nerve fibers. Controlling your blood glucose level is the key. Most people with radiculoplexus neuropathy see at least a partial improvement over time. You will need to keep your blood glucose and HbA1c levels in the target range determined by your health care provider. Things that help control blood glucose levels include:   Blood glucose monitoring.   Meal planning.   Physical activity.   Diabetes medicine.  Over time, maintaining lower blood glucose levels helps lessen symptoms. Sometimes, prescription pain medicine is needed. HOME CARE INSTRUCTIONS:  Do not smoke.  Keep your blood glucose level in the range that you and your health care provider have determined acceptable for you.  Keep your blood pressure level in the range that you and your health care provider have determined acceptable for you.  Eat a well-balanced diet.  Be active every day.  Check your feet every day. SEEK MEDICAL CARE IF:   You have burning, stabbing, or aching pain in the  legs or feet.  You are unable to feel pressure or pain in your feet.  You develop problems with digestion such as:  Nausea.  Vomiting.  Bloating.  Constipation.  Diarrhea.  Abdominal pain.  You have difficulty with urination, such as:  Incontinence.  Retention.  You have palpitations.  You develop orthostatic hypotension. When you stand up you may feel:  Dizzy.  Weak.  Faint.  You cannot attain and maintain an erection (in men).  You have vaginal  dryness and problems with decreased sexual desire and arousal (in women).  You have severe pain in your thighs, legs, or buttocks.  You have unexplained weight loss. Document Released: 12/16/2001 Document Revised: 07/28/2013 Document Reviewed: 03/18/2013 [facility] Patient Information 2014 [place].   Venous Stasis or Chronic Venous Insufficiency Chronic venous insufficiency, also called venous stasis, is a condition that affects the veins in the legs. The condition prevents blood from being pumped through these veins effectively. Blood may no longer be pumped effectively from the legs back to the heart. This condition can range from mild to severe. With proper treatment, you should be able to continue with an active life. CAUSES  Chronic venous insufficiency occurs when the vein walls become stretched, weakened, or damaged or when valves within the vein are damaged. Some common causes of this include:  High blood pressure inside the veins (venous hypertension).  Increased blood pressure in the leg veins from long periods of sitting or standing.  A blood clot that blocks blood flow in a vein (deep vein thrombosis).  Inflammation of a superficial vein (phlebitis) that causes a blood clot to form. RISK FACTORS Various things can make you more likely to develop chronic venous insufficiency, including:  Family history of this condition.  Obesity.  Pregnancy.  Sedentary lifestyle.  Smoking.  Jobs requiring long periods of standing or sitting in one place.  Being a certain age. Women in their 48s and 72s and men in their 85s are more likely to develop this condition. SIGNS AND SYMPTOMS  Symptoms may include:   Varicose veins.  Skin breakdown or ulcers.  Reddened or discolored skin on the leg.  Brown, smooth, tight, and painful skin just above the ankle, usually on the inside surface (lipodermatosclerosis).  Swelling. DIAGNOSIS  To diagnose this condition, your health  care provider will take a medical history and do a physical exam. The following tests may be ordered to confirm the diagnosis:  Duplex ultrasound A procedure that produces a picture of a blood vessel and nearby organs and also provides information on blood flow through the blood vessel.  Plethysmography A procedure that tests blood flow.  A venogram, or venography A procedure used to look at the veins using X-ray and dye. TREATMENT The goals of treatment are to help you return to an active life and to minimize pain or disability. Treatment will depend on the severity of the condition. Medical procedures may be needed for severe cases. Treatment options may include:   Use of compression stockings. These can help with symptoms and lower the chances of the problem getting worse, but they do not cure the problem.  Sclerotherapy A procedure involving an injection of a material that "dissolves" the damaged veins. Other veins in the network of blood vessels take over the function of the damaged veins.  Surgery to remove the vein or cut off blood flow through the vein (vein stripping or laser ablation surgery).  Surgery to repair a valve. HOME CARE INSTRUCTIONS  Wear compression stockings as directed by your health care provider.  Only take over-the-counter or prescription medicines for pain, discomfort, or fever as directed by your health care provider.  Follow up with your health care provider as directed. SEEK MEDICAL CARE IF:   You have redness, swelling, or increasing pain in the affected area.  You see a red streak or line that extends up or down from the affected area.  You have a breakdown or loss of skin in the affected area, even if the breakdown is small.  You have an injury to the affected area. SEEK IMMEDIATE MEDICAL CARE IF:   You have an injury and open wound in the affected area.  Your pain is severe and does not improve with medicine.  You have sudden numbness or  weakness in the foot or ankle below the affected area, or you have trouble moving your foot or ankle.  You have a fever or persistent symptoms for more than 2 3 days.  You have a fever and your symptoms suddenly get worse. MAKE SURE YOU:   Understand these instructions.  Will watch your condition.  Will get help right away if you are not doing well or get worse. Document Released: 02/10/2007 Document Revised: 07/28/2013 Document Reviewed: 06/14/2013 [facility] Patient Information 2014 [place].

## 2013-11-30 ENCOUNTER — Encounter: Payer: Self-pay | Admitting: Internal Medicine

## 2013-11-30 DIAGNOSIS — E663 Overweight: Secondary | ICD-10-CM | POA: Insufficient documentation

## 2013-11-30 NOTE — Assessment & Plan Note (Signed)
Diet and BS reviewed,  Advised to increase evening insulin dowse to 18 units daily,  Advised to reduce mornign dose from 15 to 12 if he is eating eggs and continue 15 units for cereal breakfast.  Foot exam done.  Return in one month

## 2013-11-30 NOTE — Assessment & Plan Note (Signed)
I have addressed  BMI and recommended wt loss of 10% of body weight over the next 6 months using a low glycemic index diet and regular exercise a minimum of 5 days per week.   

## 2013-12-03 ENCOUNTER — Telehealth: Payer: Self-pay

## 2013-12-03 NOTE — Telephone Encounter (Signed)
Relevant patient education mailed to patient.  

## 2013-12-17 ENCOUNTER — Telehealth: Payer: Self-pay | Admitting: Internal Medicine

## 2013-12-17 MED ORDER — HYDROCODONE-ACETAMINOPHEN 5-325 MG PO TABS: 1.0000 | ORAL_TABLET | Freq: Four times a day (QID) | ORAL | Status: DC | PRN

## 2013-12-17 NOTE — Telephone Encounter (Signed)
Please advise received 90 11/19/13

## 2013-12-17 NOTE — Telephone Encounter (Signed)
Patient notified and placed up front.

## 2013-12-17 NOTE — Telephone Encounter (Signed)
Ok to refill,  printed rx s for 2 months

## 2013-12-17 NOTE — Telephone Encounter (Signed)
The patient is wanting a prescription for March 1st and April 1st .   HYDROcodone-acetaminophen (NORCO/VICODIN) 5-325 MG per

## 2013-12-18 ENCOUNTER — Other Ambulatory Visit: Payer: Self-pay | Admitting: Internal Medicine

## 2013-12-27 ENCOUNTER — Ambulatory Visit (INDEPENDENT_AMBULATORY_CARE_PROVIDER_SITE_OTHER): Payer: Medicare Other | Admitting: Internal Medicine

## 2013-12-27 ENCOUNTER — Encounter: Payer: Self-pay | Admitting: Internal Medicine

## 2013-12-27 VITALS — BP 140/72 | HR 68 | Temp 97.6°F | Resp 18 | Wt 205.5 lb

## 2013-12-27 DIAGNOSIS — Z23 Encounter for immunization: Secondary | ICD-10-CM

## 2013-12-27 DIAGNOSIS — E1149 Type 2 diabetes mellitus with other diabetic neurological complication: Secondary | ICD-10-CM

## 2013-12-27 DIAGNOSIS — N183 Chronic kidney disease, stage 3 unspecified: Secondary | ICD-10-CM

## 2013-12-27 DIAGNOSIS — I10 Essential (primary) hypertension: Secondary | ICD-10-CM

## 2013-12-27 DIAGNOSIS — E1151 Type 2 diabetes mellitus with diabetic peripheral angiopathy without gangrene: Secondary | ICD-10-CM

## 2013-12-27 DIAGNOSIS — G909 Disorder of the autonomic nervous system, unspecified: Secondary | ICD-10-CM

## 2013-12-27 DIAGNOSIS — E1143 Type 2 diabetes mellitus with diabetic autonomic (poly)neuropathy: Secondary | ICD-10-CM

## 2013-12-27 DIAGNOSIS — I798 Other disorders of arteries, arterioles and capillaries in diseases classified elsewhere: Secondary | ICD-10-CM

## 2013-12-27 DIAGNOSIS — E1159 Type 2 diabetes mellitus with other circulatory complications: Secondary | ICD-10-CM

## 2013-12-27 MED ORDER — GABAPENTIN 300 MG PO CAPS: 300.0000 mg | ORAL_CAPSULE | Freq: Three times a day (TID) | ORAL | Status: DC

## 2013-12-27 NOTE — Progress Notes (Signed)
Patient ID: Henry Lindsey, male   DOB: 05/10/1933, 80 y.o.   MRN: [id number]  . Patient Active Problem List   Diagnosis Date Noted  . CKD (chronic kidney disease), stage III 12/28/2013  . Peripheral autonomic neuropathy due to DM 12/28/2013  . Overweight (BMI 25.0-29.9) 11/30/2013  . Unspecified vitamin D deficiency 11/03/2013  . Impotence due to erectile dysfunction 06/01/2013  . Routine general medical examination at a health care facility 04/04/2013  . CAD (coronary artery disease) 07/21/2012  . Peripheral vascular disease in diabetes mellitus   . Occlusion and stenosis of carotid artery with cerebral infarction   . PAD (peripheral artery disease) 07/01/2012  . Basal Cell Carcinoma Of Cheek 08/05/2011  . 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 Type II or unspecified type diabetes mellitus with peripheral circulatory disorders, uncontrolled(250.72) 08/05/2011  . Hyperlipidemia   . Hypertension   . CVA (cerebral infarction)     Subjective:  CC:   Chief Complaint  Patient presents with  . Follow-up  . Diabetes    HPI:   Henry Lindsey is a 80 y.o. male who presents for  Follow up on uncontrolled DM,  Last aqc 9.7 in January.  Has been modifying his diet since January. Diet reviewed,  He is eating a high Fiber cereal every morning with fresh blueberries .  Fasting sugars are  117 in the morning , post breakfast 158. 137 late afternoon and  At 9 pm 154    Likes  To exercise on a regular exercise but has been having Heel pain with wt bearing   Gabapentin helping wih neuropathy.  Has doubled the dose, for the past week and it is helping    Past Medical History  Diagnosis Date  . Diabetes mellitus   . Neuromuscular disorder   . Hyperlipidemia   . Hypertension   . CVA (cerebral infarction) feb 2012    r thalamic lacunar  . Peripheral vascular disease in diabetes mellitus Sept 2013     95% occlusion s/p PTCA R SFA Dew Sept 2013  . Carotid artery stenosis Feb 2012    <50% bilaterally     Past Surgical History  Procedure Laterality Date  . Upper gastrointestinal endoscopy    . Joint replacement Left 2014    left knee       The following portions of the patient's history were reviewed and updated as appropriate: Allergies, current medications, and problem list.    Review of Systems:   Patient denies headache, fevers, malaise, unintentional weight loss, skin rash, eye pain, sinus congestion and sinus pain, sore throat, dysphagia,  hemoptysis , cough, dyspnea, wheezing, chest pain, palpitations, orthopnea, edema, abdominal pain, nausea, melena, diarrhea, constipation, flank pain, dysuria, hematuria, urinary  Frequency, nocturia, numbness, tingling, seizures,  Focal weakness, Loss of consciousness,  Tremor, insomnia, depression, anxiety, and suicidal ideation.     History   Social History  . Marital Status: Married    Spouse Name: Enid Derry, deceased    Number of Children: 2  . Years of Education: 12   Occupational History  .     Social History Main Topics  . Smoking status: Former Smoker    Quit date: 04/21/1968  . Smokeless tobacco: Never Used     Comment: quit 1969  . Alcohol Use: No  . Drug Use: No  . Sexual Activity: Yes    Partners: Female   Other Topics Concern  . Not on file   Social History Narrative   Widowed  in 2014; dating again     Objective:  Filed Vitals:   12/27/13 1401  BP: 140/72  Pulse: 68  Temp: 97.6 F (36.4 C)  Resp: 18     General appearance: alert, cooperative and appears stated age Ears: normal TM's and external ear canals both ears Throat: lips, mucosa, and tongue normal; teeth and gums normal Neck: no adenopathy, no carotid bruit, supple, symmetrical, trachea midline and thyroid not enlarged, symmetric, no tenderness/mass/nodules Back: symmetric, no curvature. ROM normal. No CVA tenderness. Lungs: clear to auscultation bilaterally Heart: regular rate and rhythm, S1, S2 normal, no murmur, click, rub or  gallop Abdomen: soft, non-tender; bowel sounds normal; no masses,  no organomegaly Pulses: 2+ and symmetric Skin: Skin color, texture, turgor normal. No rashes or lesions Lymph nodes: Cervical, supraclavicular, and axillary nodes normal.  Assessment and Plan:  Type II or unspecified type diabetes mellitus with peripheral circulatory disorders, uncontrolled(250.72) Diet reviewed,  Blood sugars reviewed and reflect improved control.  Peripheral vascular disease in diabetes mellitus Encouraged to resume walking,  continue asa and statin   Hypertension Well controlled on current regimen. Renal function stable, no changes today.  Lab Results  Component Value Date   CREATININE 1.6* 11/02/2013     CKD (chronic kidney disease), stage III New onset, will repeat ck in 2 months and refer to nephrology if still elevated  Peripheral autonomic neuropathy due to DM Increased gabapentin to 300 mg qhs   Updated Medication List Outpatient Encounter Prescriptions as of 12/27/2013  Medication Sig  . amLODipine (NORVASC) 10 MG tablet TAKE 1 TABLET BY MOUTH EVERY DAY  . aspirin EC 81 MG tablet Take 1 tablet (81 mg total) by mouth daily.  [name] atorvastatin (LIPITOR) 40 MG tablet TAKE 1 TABLET BY MOUTH EVERY DAY  . fenofibrate micronized (LOFIBRA) 134 MG capsule TAKE 1 CAPSULE BY MOUTH EVERY MORNING BEFORE BREAKFAST  . gabapentin (NEURONTIN) 300 MG capsule Take 1 capsule (300 mg total) by mouth 3 (three) times daily.  [name] glipiZIDE (GLUCOTROL) 10 MG tablet TAKE 1 TABLET BY MOUTH TWICE DAILY  . glucose blood (BAYER CONTOUR TEST) test strip Use as instructed  . HYDROcodone-acetaminophen (NORCO/VICODIN) 5-325 MG per tablet Take 1 tablet by mouth every 6 (six) hours as needed.  . insulin lispro protamine-lispro (HUMALOG MIX 75/25) (75-25) 100 UNIT/ML SUSP injection INJECT 15 UNITS UNDER THE SKIN BEFORE BREAKFAST AND 18 UNITS BEFORE DINNER  . INSULIN SYRINGE .5CC/29G 29G X 1/2" 0.5 ML MISC 1 Syringe by Does not  apply route 3 (three) times daily.  . Insulin Syringe-Needle U-100 (INSULIN SYRINGE .5CC/31GX5/16") 31G X 5/16" 0.5 ML MISC USE THREE TIMES DAILY  . losartan (COZAAR) 100 MG tablet TAKE 1 TABLET BY MOUTH EVERY DAY  . metFORMIN (GLUCOPHAGE) 1000 MG tablet TAKE 1 TABLET BY MOUTH TWICE DAILY  . metoprolol succinate (TOPROL-XL) 100 MG 24 hr tablet TAKE 1 TABLET BY MOUTH ONCE DAILY  . tamsulosin (FLOMAX) 0.4 MG CAPS capsule Take 0.4 mg by mouth daily after breakfast.  . traMADol (ULTRAM) 50 MG tablet   . [DISCONTINUED] gabapentin (NEURONTIN) 100 MG capsule Take 1 capsule (100 mg total) by mouth 3 (three) times daily.  [name] HUMULIN R 100 UNIT/ML injection INJECT 10 UNITS UNDER THE SKIN THREE TIMES DAILY BEFORE MEALS     Orders Placed This Encounter  Procedures  . Pneumococcal conjugate vaccine 13-valent    Return in about 3 months (around 03/29/2014) for follow up diabetes.

## 2013-12-27 NOTE — Patient Instructions (Signed)
You have gained 5 lbs since last visit.!! Please start walking every day to lower your blood sugars and get the weight off  Return after April 13  th for repeat fasting labs.   You received your last Pneumonia vaccine today   I have inctreased the gabapentin to 300 ,  You can take it one to three times daily for neuropathy  Your heel pain may be due to a "bone spur"

## 2013-12-27 NOTE — Progress Notes (Signed)
Pre-visit discussion using our clinic review tool. No additional management support is needed unless otherwise documented below in the visit note.  

## 2013-12-28 ENCOUNTER — Encounter: Payer: Self-pay | Admitting: Internal Medicine

## 2013-12-28 DIAGNOSIS — E1143 Type 2 diabetes mellitus with diabetic autonomic (poly)neuropathy: Secondary | ICD-10-CM | POA: Insufficient documentation

## 2013-12-28 DIAGNOSIS — E1121 Type 2 diabetes mellitus with diabetic nephropathy: Secondary | ICD-10-CM | POA: Insufficient documentation

## 2013-12-28 DIAGNOSIS — IMO0002 Reserved for concepts with insufficient information to code with codable children: Secondary | ICD-10-CM | POA: Insufficient documentation

## 2013-12-28 DIAGNOSIS — E1165 Type 2 diabetes mellitus with hyperglycemia: Secondary | ICD-10-CM

## 2013-12-28 DIAGNOSIS — Z794 Long term (current) use of insulin: Secondary | ICD-10-CM

## 2013-12-28 NOTE — Assessment & Plan Note (Signed)
Encouraged to resume walking,  continue asa and statin

## 2013-12-28 NOTE — Assessment & Plan Note (Signed)
New onset, will repeat ck in 2 months and refer to nephrology if still elevated

## 2013-12-28 NOTE — Assessment & Plan Note (Signed)
Increased gabapentin to 300 mg qhs

## 2013-12-28 NOTE — Assessment & Plan Note (Signed)
Well controlled on current regimen. Renal function stable, no changes today.  Lab Results  Component Value Date   CREATININE 1.6* 11/02/2013

## 2013-12-28 NOTE — Assessment & Plan Note (Signed)
Diet reviewed,  Blood sugars reviewed and reflect improved control.

## 2013-12-31 ENCOUNTER — Other Ambulatory Visit: Payer: Self-pay | Admitting: Internal Medicine

## 2014-02-03 ENCOUNTER — Telehealth: Payer: Self-pay | Admitting: *Deleted

## 2014-02-03 DIAGNOSIS — E1159 Type 2 diabetes mellitus with other circulatory complications: Secondary | ICD-10-CM

## 2014-02-03 NOTE — Addendum Note (Signed)
Addended by: [name] on: 02/03/2014 05:28 PM   Modules accepted: Orders

## 2014-02-03 NOTE — Telephone Encounter (Signed)
Pt coming tomorrow what labs and dx? 

## 2014-02-04 ENCOUNTER — Other Ambulatory Visit (INDEPENDENT_AMBULATORY_CARE_PROVIDER_SITE_OTHER): Payer: Medicare Other

## 2014-02-04 DIAGNOSIS — E1159 Type 2 diabetes mellitus with other circulatory complications: Secondary | ICD-10-CM

## 2014-02-04 LAB — COMPREHENSIVE METABOLIC PANEL
ALBUMIN: 4 g/dL (ref 3.5–5.2)
ALT: 31 U/L (ref 0–53)
AST: 25 U/L (ref 0–37)
Alkaline Phosphatase: 47 U/L (ref 39–117)
BUN: 26 mg/dL — AB (ref 6–23)
CALCIUM: 9.5 mg/dL (ref 8.4–10.5)
CHLORIDE: 100 meq/L (ref 96–112)
CO2: 27 mEq/L (ref 19–32)
CREATININE: 1.4 mg/dL (ref 0.4–1.5)
GFR: 50.73 mL/min — AB (ref >60.00)
Glucose, Bld: 183 mg/dL — ABNORMAL HIGH (ref 70–99)
POTASSIUM: 5 meq/L (ref 3.5–5.1)
Sodium: 137 mEq/L (ref 135–145)
Total Bilirubin: 0.7 mg/dL (ref 0.3–1.2)
Total Protein: 6.9 g/dL (ref 6.0–8.3)

## 2014-02-04 LAB — LIPID PANEL
CHOLESTEROL: 108 mg/dL (ref 0–200)
HDL: 36.2 mg/dL — ABNORMAL LOW (ref >39.00)
LDL Cholesterol: 50 mg/dL (ref 0–99)
TRIGLYCERIDES: 109 mg/dL (ref 0.0–149.0)
Total CHOL/HDL Ratio: 3
VLDL: 21.8 mg/dL (ref 0.0–40.0)

## 2014-02-04 LAB — HEMOGLOBIN A1C: Hgb A1c MFr Bld: 8.8 % — ABNORMAL HIGH (ref 4.6–6.5)

## 2014-02-04 LAB — MICROALBUMIN / CREATININE URINE RATIO
Creatinine,U: 89.4 mg/dL
Microalb Creat Ratio: 2.1 mg/g (ref 0.0–30.0)
Microalb, Ur: 1.9 mg/dL (ref 0.0–1.9)

## 2014-02-09 NOTE — Telephone Encounter (Signed)
After doing research it looks as if this medication was sent in. I am closing this encounter.

## 2014-02-11 ENCOUNTER — Telehealth: Payer: Self-pay | Admitting: Internal Medicine

## 2014-02-11 ENCOUNTER — Other Ambulatory Visit: Payer: Self-pay | Admitting: *Deleted

## 2014-02-11 NOTE — Telephone Encounter (Signed)
glucose blood (BAYER CONTOUR TEST) test strip

## 2014-02-14 MED ORDER — GLUCOSE BLOOD VI STRP: ORAL_STRIP | Status: DC

## 2014-02-14 MED ORDER — HYDROCODONE-ACETAMINOPHEN 5-325 MG PO TABS: 1.0000 | ORAL_TABLET | Freq: Four times a day (QID) | ORAL | Status: DC | PRN

## 2014-02-14 NOTE — Telephone Encounter (Signed)
Pt came in checking on the refill for test strips  Also he needed to get a refill for may and June for the following med Hydrocodone/acetaminophen 5-325 tb Take 1 tablet by mouth every 6 hours as needed Please call when ready for pick up

## 2014-02-14 NOTE — Telephone Encounter (Signed)
Test strips ordered please advise as to refill on Hydrocodone.

## 2014-02-14 NOTE — Telephone Encounter (Signed)
vicodin refills printed

## 2014-02-15 NOTE — Telephone Encounter (Signed)
Tried to reach patient for scripts ready for pick up no answer and voicemail is full unable to leave message.

## 2014-02-16 ENCOUNTER — Telehealth: Payer: Self-pay | Admitting: Internal Medicine

## 2014-02-16 MED ORDER — GLUCOSE BLOOD VI STRP: ORAL_STRIP | Status: DC

## 2014-02-16 NOTE — Telephone Encounter (Signed)
Please advise 

## 2014-02-16 NOTE — Telephone Encounter (Signed)
Patient notified and voiced understanding.

## 2014-02-16 NOTE — Telephone Encounter (Signed)
Patient has 2 scripts to pick up

## 2014-02-16 NOTE — Telephone Encounter (Signed)
Pt came in today  Stating he was put on prednisone 20 mg tablets  For poison iv ey Pt state this is effecting his blood surger readings. In am fasting 117  Lunch time 1 hour after eating 180-200 He said sometime the reading would be 300 in pm  He rescheduled his appointment from 02/21/13 to  02/28/13

## 2014-02-16 NOTE — Telephone Encounter (Signed)
He can increaase his insulin doses by 3 units for a few days,  Once the predniosne is out of his system he may need to reduce it

## 2014-02-21 ENCOUNTER — Ambulatory Visit: Payer: Medicare Other | Admitting: Internal Medicine

## 2014-02-28 ENCOUNTER — Ambulatory Visit: Payer: Medicare Other | Admitting: Internal Medicine

## 2014-03-05 LAB — HM DIABETES EYE EXAM

## 2014-03-22 ENCOUNTER — Encounter: Payer: Self-pay | Admitting: Internal Medicine

## 2014-04-05 ENCOUNTER — Encounter: Payer: Self-pay | Admitting: Internal Medicine

## 2014-04-05 ENCOUNTER — Ambulatory Visit (INDEPENDENT_AMBULATORY_CARE_PROVIDER_SITE_OTHER): Payer: Medicare Other | Admitting: Internal Medicine

## 2014-04-05 VITALS — BP 120/64 | HR 95 | Temp 97.8°F | Resp 16 | Ht 70.0 in | Wt 204.5 lb

## 2014-04-05 DIAGNOSIS — I798 Other disorders of arteries, arterioles and capillaries in diseases classified elsewhere: Secondary | ICD-10-CM

## 2014-04-05 DIAGNOSIS — H409 Unspecified glaucoma: Secondary | ICD-10-CM | POA: Insufficient documentation

## 2014-04-05 DIAGNOSIS — M7071 Other bursitis of hip, right hip: Secondary | ICD-10-CM

## 2014-04-05 DIAGNOSIS — E1151 Type 2 diabetes mellitus with diabetic peripheral angiopathy without gangrene: Secondary | ICD-10-CM

## 2014-04-05 DIAGNOSIS — I70209 Unspecified atherosclerosis of native arteries of extremities, unspecified extremity: Secondary | ICD-10-CM

## 2014-04-05 DIAGNOSIS — E785 Hyperlipidemia, unspecified: Secondary | ICD-10-CM

## 2014-04-05 DIAGNOSIS — M1712 Unilateral primary osteoarthritis, left knee: Secondary | ICD-10-CM

## 2014-04-05 DIAGNOSIS — M1710 Unilateral primary osteoarthritis, unspecified knee: Secondary | ICD-10-CM

## 2014-04-05 DIAGNOSIS — Z0000 Encounter for general adult medical examination without abnormal findings: Secondary | ICD-10-CM

## 2014-04-05 DIAGNOSIS — M76899 Other specified enthesopathies of unspecified lower limb, excluding foot: Secondary | ICD-10-CM

## 2014-04-05 DIAGNOSIS — N400 Enlarged prostate without lower urinary tract symptoms: Secondary | ICD-10-CM | POA: Insufficient documentation

## 2014-04-05 DIAGNOSIS — N529 Male erectile dysfunction, unspecified: Secondary | ICD-10-CM

## 2014-04-05 DIAGNOSIS — IMO0002 Reserved for concepts with insufficient information to code with codable children: Secondary | ICD-10-CM

## 2014-04-05 DIAGNOSIS — E1159 Type 2 diabetes mellitus with other circulatory complications: Secondary | ICD-10-CM

## 2014-04-05 DIAGNOSIS — I63239 Cerebral infarction due to unspecified occlusion or stenosis of unspecified carotid arteries: Secondary | ICD-10-CM

## 2014-04-05 LAB — HM DIABETES FOOT EXAM: HM Diabetic Foot Exam: NORMAL

## 2014-04-05 MED ORDER — HYDROCODONE-ACETAMINOPHEN 5-325 MG PO TABS: 1.0000 | ORAL_TABLET | Freq: Four times a day (QID) | ORAL | Status: DC | PRN

## 2014-04-05 MED ORDER — ATORVASTATIN CALCIUM 40 MG PO TABS: ORAL_TABLET | ORAL | Status: DC

## 2014-04-05 NOTE — Progress Notes (Signed)
Patient ID: Henry Lindsey, male   DOB: October 01, 1933, 80 y.o.   MRN: [id number]   The patient is here for annual Medicare wellness examination and management of other chronic and acute problems. Including DM type 2 , and bursitis of the right hip, and BPH.  His nocturia has improved with urology's intervention with addition of Flomax.  His blood sugars have been < 140 fasting most of the time and he has not been checking them after meals.    The risk factors are reflected in the social history.  The roster of all physicians providing medical care to patient - is listed in the Snapshot section of the chart.  Activities of daily living:  The patient is 100% independent in all ADLs: dressing, toileting, feeding as well as independent mobility  Home safety : The patient has smoke detectors in the home. They wear seatbelts.  There are no firearms at home. There is no violence in the home.   There is no risks for hepatitis, STDs or HIV. There is no   history of blood transfusion. They have no travel history to infectious disease endemic areas of the world.  The patient has seen their dentist in the last six month. They have seen their eye doctor in the last year. They admit to slight hearing difficulty with regard to whispered voices and some television programs.  They have deferred audiologic testing in the last year.  They do not  have excessive sun exposure. Discussed the need for sun protection: hats, long sleeves and use of sunscreen if there is significant sun exposure.   Diet: the importance of a healthy diet is discussed. They do have a healthy diet.  The benefits of regular aerobic exercise were discussed. She walks 4 times per week ,  20 minutes.   Depression screen: there are no signs or vegative symptoms of depression- irritability, change in appetite, anhedonia, sadness/tearfullness.  Cognitive assessment: the patient manages all their financial and personal affairs and is actively engaged.  They could relate day,date,year and events; recalled 2/3 objects at 3 minutes; performed clock-face test normally.  The following portions of the patient's history were reviewed and updated as appropriate: allergies, current medications, past family history, past medical history,  past surgical history, past social history  and problem list.  Visual acuity was not assessed per patient preference since she has regular follow up with her ophthalmologist. Hearing and body mass index were assessed and reviewed.   During the course of the visit the patient was educated and counseled about appropriate screening and preventive services including : fall prevention , diabetes screening, nutrition counseling, colorectal cancer screening, and recommended immunizations.    Objective:  BP 120/64  Pulse 95  Temp(Src) 97.8 F (36.6 C) (Oral)  Resp 16  Ht 5\' 10"  (1.778 m)  Wt 204 lb 8 oz (92.761 kg)  BMI 29.34 kg/m2  SpO2 97%  General Appearance:    Alert, cooperative, no distress, appears stated age  Head:    Normocephalic, without obvious abnormality, atraumatic  Eyes:    PERRL, conjunctiva/corneas clear, EOM's intact, fundi    benign, both eyes       Ears:    Normal TM's and external ear canals, both ears  Nose:   Nares normal, septum midline, mucosa normal, no drainage   or sinus tenderness  Throat:   Lips, mucosa, and tongue normal; teeth and gums normal  Neck:   Supple, symmetrical, trachea midline, no adenopathy;  thyroid:  No enlargement/tenderness/nodules; no carotid   bruit or JVD  Back:     Symmetric, no curvature, ROM normal, no CVA tenderness  Lungs:     Clear to auscultation bilaterally, respirations unlabored  Chest wall:    No tenderness or deformity  Heart:    Regular rate and rhythm, S1 and S2 normal, no murmur, rub   or gallop  Abdomen:     Soft, non-tender, bowel sounds active all four quadrants,    no masses, no organomegaly  Extremities:   Extremities normal,  atraumatic, no cyanosis or edema  Pulses:   2+ and symmetric all extremities  Skin:   Skin color, texture, turgor normal, no rashes or lesions  Lymph nodes:   Cervical, supraclavicular, and axillary nodes normal  Neurologic:   CNII-XII intact. Normal strength, sensation and reflexes      throughout   Assessment and Plan:   Type II or unspecified type diabetes mellitus with peripheral circulatory disorders, uncontrolled(250.72) Lab Results  Component Value Date   HGBA1C 8.8* 02/04/2014    Uncontrolled, due to noncompliance with diet and diabetes lifestyle.  He has improved his blood sugars with more attention to diet and exercise. He is on ASA, statin and an ARB,  He will return in mid July for labs.     Routine general medical examination at a health care facility Annual male exam was done .  Colon ca screening was reviewed and options given.    Left knee DJD Managed with aleve and hydrocodone.   Peripheral vascular disease in diabetes mellitus He has not followed up with Vascular surgery because he blames Dr . Henry Lindsey for causing his impotence.  Discussed the improbability of his balloon angioplasty causing his ED.  Referral made for follow up.  Impotence due to erectile dysfunction Corrected patient's opinion on the cause of his ED.    BPH (benign prostatic hyperplasia) Managed/improved  with flomax by Dr Henry Lindsey ,  Last exam was May 2015. Has not decided whether to follow him when he moves to [facility]   Hyperlipidemia Well controlled on current statin and fenofibrate therapy.   Liver enzymes are normal , no changes today.  Lab Results  Component Value Date   CHOL 108 02/04/2014   HDL 36.20* 02/04/2014   LDLCALC 50 02/04/2014   LDLDIRECT 67.8 02/15/2013   TRIG 109.0 02/04/2014   CHOLHDL 3 02/04/2014   Lab Results  Component Value Date   ALT 31 02/04/2014   AST 25 02/04/2014   ALKPHOS 47 02/04/2014   BILITOT 0.7 02/04/2014      Bursitis of hip, right Managed with cortisone  injections.  He continues to exercise.    Updated Medication List Outpatient Encounter Prescriptions as of 04/05/2014  Medication Sig  . amLODipine (NORVASC) 10 MG tablet TAKE 1 TABLET BY MOUTH EVERY DAY  . aspirin EC 81 MG tablet Take 1 tablet (81 mg total) by mouth daily.  [name] atorvastatin (LIPITOR) 40 MG tablet TAKE 1 TABLET BY MOUTH EVERY DAY  . fenofibrate micronized (LOFIBRA) 134 MG capsule TAKE 1 CAPSULE BY MOUTH EVERY MORNING BEFORE BREAKFAST  . gabapentin (NEURONTIN) 300 MG capsule Take 1 capsule (300 mg total) by mouth 3 (three) times daily.  [name] glipiZIDE (GLUCOTROL) 10 MG tablet TAKE 1 TABLET BY MOUTH TWICE DAILY  . glucose blood (BAYER CONTOUR TEST) test strip Use three times daily as directed Dx: 250.72  . HUMULIN R 100 UNIT/ML injection INJECT 10 UNITS UNDER THE SKIN THREE TIMES DAILY  BEFORE MEALS  . HYDROcodone-acetaminophen (NORCO/VICODIN) 5-325 MG per tablet Take 1 tablet by mouth every 6 (six) hours as needed.  . insulin lispro protamine-lispro (HUMALOG MIX 75/25) (75-25) 100 UNIT/ML SUSP injection INJECT 15 UNITS UNDER THE SKIN BEFORE BREAKFAST AND 18 UNITS BEFORE DINNER  . INSULIN SYRINGE .5CC/29G 29G X 1/2" 0.5 ML MISC 1 Syringe by Does not apply route 3 (three) times daily.  . Insulin Syringe-Needle U-100 (INSULIN SYRINGE .5CC/31GX5/16") 31G X 5/16" 0.5 ML MISC USE THREE TIMES DAILY  . losartan (COZAAR) 100 MG tablet TAKE 1 TABLET BY MOUTH EVERY DAY  . metFORMIN (GLUCOPHAGE) 1000 MG tablet TAKE 1 TABLET BY MOUTH TWICE DAILY  . metoprolol succinate (TOPROL-XL) 100 MG 24 hr tablet TAKE 1 TABLET BY MOUTH ONCE DAILY  . tamsulosin (FLOMAX) 0.4 MG CAPS capsule Take 0.4 mg by mouth daily after breakfast.  . traMADol (ULTRAM) 50 MG tablet   . [DISCONTINUED] atorvastatin (LIPITOR) 40 MG tablet TAKE 1 TABLET BY MOUTH EVERY DAY  . [DISCONTINUED] HYDROcodone-acetaminophen (NORCO/VICODIN) 5-325 MG per tablet Take 1 tablet by mouth every 6 (six) hours as needed.  . [DISCONTINUED]  HYDROcodone-acetaminophen (NORCO/VICODIN) 5-325 MG per tablet Take 1 tablet by mouth every 6 (six) hours as needed.  . [DISCONTINUED] HYDROcodone-acetaminophen (NORCO/VICODIN) 5-325 MG per tablet Take 1 tablet by mouth every 6 (six) hours as needed.

## 2014-04-05 NOTE — Assessment & Plan Note (Signed)
Annual male exam was done .  Colon ca screening was reviewed and options given.

## 2014-04-05 NOTE — Progress Notes (Signed)
Pre-visit discussion using our clinic review tool. No additional management support is needed unless otherwise documented below in the visit note.  

## 2014-04-05 NOTE — Assessment & Plan Note (Addendum)
Lab Results  Component Value Date   HGBA1C 8.8* 02/04/2014    Uncontrolled, due to noncompliance with diet and diabetes lifestyle.  He has improved his blood sugars with more attention to diet and exercise. He is on ASA, statin and an ARB,  He will return in mid July for labs.

## 2014-04-05 NOTE — Assessment & Plan Note (Signed)
Managed with aleve and hydrocodone.

## 2014-04-05 NOTE — Patient Instructions (Addendum)
Your diabetes  Was out of control in April ,  But you sugars are much better currently   I would like you to start checking the sugar 2 hr s after lunch or two hours after dinner AND LET ME REVIEW THEM  .  Your morning sugars are fine.   Our goal is to keep sugars between 100 and 140 both fasting and after eating  On most days  Keep your daily  exercsie or your daily Walk early in the morning  If you do it outside,  Or after dinner    [facility]   Your hip will bother you less than when you walk.     Return for fasting labs on or after July 17th,    i will see you again in September

## 2014-04-06 ENCOUNTER — Encounter: Payer: Self-pay | Admitting: Internal Medicine

## 2014-04-06 NOTE — Assessment & Plan Note (Signed)
Well controlled on current statin and fenofibrate therapy.   Liver enzymes are normal , no changes today.  Lab Results  Component Value Date   CHOL 108 02/04/2014   HDL 36.20* 02/04/2014   LDLCALC 50 02/04/2014   LDLDIRECT 67.8 02/15/2013   TRIG 109.0 02/04/2014   CHOLHDL 3 02/04/2014   Lab Results  Component Value Date   ALT 31 02/04/2014   AST 25 02/04/2014   ALKPHOS 47 02/04/2014   BILITOT 0.7 02/04/2014

## 2014-04-06 NOTE — Assessment & Plan Note (Signed)
He has not followed up with Vascular surgery because he blames [name] for causing his impotence.  Discussed the improbability of his balloon angioplasty causing his ED.  Referral made for follow up.

## 2014-04-06 NOTE — Assessment & Plan Note (Signed)
Managed/improved  with flomax by [name] ,  Last exam was May 2015. Has not decided whether to follow him when he moves to [facility]

## 2014-04-06 NOTE — Assessment & Plan Note (Signed)
Managed with cortisone injections.  He continues to exercise.

## 2014-04-06 NOTE — Assessment & Plan Note (Signed)
Corrected patient's opinion on the cause of his ED.

## 2014-04-15 ENCOUNTER — Other Ambulatory Visit: Payer: Self-pay | Admitting: Internal Medicine

## 2014-05-09 ENCOUNTER — Other Ambulatory Visit (INDEPENDENT_AMBULATORY_CARE_PROVIDER_SITE_OTHER): Payer: Medicare Other

## 2014-05-09 DIAGNOSIS — E1159 Type 2 diabetes mellitus with other circulatory complications: Secondary | ICD-10-CM

## 2014-05-09 LAB — LIPID PANEL
CHOLESTEROL: 123 mg/dL (ref 0–200)
HDL: 42 mg/dL (ref >39.00)
LDL CALC: 60 mg/dL (ref 0–99)
NonHDL: 81
Total CHOL/HDL Ratio: 3
Triglycerides: 104 mg/dL (ref 0.0–149.0)
VLDL: 20.8 mg/dL (ref 0.0–40.0)

## 2014-05-09 LAB — MICROALBUMIN / CREATININE URINE RATIO
Creatinine,U: 122 mg/dL
Microalb Creat Ratio: 5.2 mg/g (ref 0.0–30.0)
Microalb, Ur: 6.3 mg/dL — ABNORMAL HIGH (ref 0.0–1.9)

## 2014-05-09 LAB — COMPREHENSIVE METABOLIC PANEL
ALBUMIN: 4 g/dL (ref 3.5–5.2)
ALT: 22 U/L (ref 0–53)
AST: 20 U/L (ref 0–37)
Alkaline Phosphatase: 44 U/L (ref 39–117)
BUN: 20 mg/dL (ref 6–23)
CALCIUM: 9.2 mg/dL (ref 8.4–10.5)
CHLORIDE: 102 meq/L (ref 96–112)
CO2: 29 mEq/L (ref 19–32)
Creatinine, Ser: 1.4 mg/dL (ref 0.4–1.5)
GFR: 50.7 mL/min — AB (ref >60.00)
GLUCOSE: 155 mg/dL — AB (ref 70–99)
Potassium: 5.1 mEq/L (ref 3.5–5.1)
SODIUM: 141 meq/L (ref 135–145)
TOTAL PROTEIN: 6.5 g/dL (ref 6.0–8.3)
Total Bilirubin: 0.6 mg/dL (ref 0.2–1.2)

## 2014-05-09 LAB — HEMOGLOBIN A1C: Hgb A1c MFr Bld: 8.8 % — ABNORMAL HIGH (ref 4.6–6.5)

## 2014-05-10 NOTE — Addendum Note (Signed)
Addended by: [name] on: 05/10/2014 10:09 AM   Modules accepted: Orders

## 2014-05-18 ENCOUNTER — Ambulatory Visit: Payer: Medicare Other | Admitting: Endocrinology

## 2014-05-21 ENCOUNTER — Other Ambulatory Visit: Payer: Self-pay | Admitting: Internal Medicine

## 2014-06-02 ENCOUNTER — Other Ambulatory Visit: Payer: Self-pay | Admitting: Internal Medicine

## 2014-06-23 ENCOUNTER — Other Ambulatory Visit: Payer: Self-pay | Admitting: Internal Medicine

## 2014-06-29 ENCOUNTER — Other Ambulatory Visit: Payer: Self-pay | Admitting: Internal Medicine

## 2014-07-06 ENCOUNTER — Encounter: Payer: Self-pay | Admitting: Internal Medicine

## 2014-07-06 ENCOUNTER — Ambulatory Visit (INDEPENDENT_AMBULATORY_CARE_PROVIDER_SITE_OTHER): Payer: Medicare Other | Admitting: Internal Medicine

## 2014-07-06 VITALS — BP 134/68 | HR 83 | Temp 98.6°F | Resp 16 | Ht 70.0 in | Wt 205.2 lb

## 2014-07-06 DIAGNOSIS — Z9119 Patient's noncompliance with other medical treatment and regimen: Secondary | ICD-10-CM

## 2014-07-06 DIAGNOSIS — E785 Hyperlipidemia, unspecified: Secondary | ICD-10-CM

## 2014-07-06 DIAGNOSIS — E1159 Type 2 diabetes mellitus with other circulatory complications: Secondary | ICD-10-CM

## 2014-07-06 DIAGNOSIS — I798 Other disorders of arteries, arterioles and capillaries in diseases classified elsewhere: Secondary | ICD-10-CM

## 2014-07-06 DIAGNOSIS — Z9111 Patient's noncompliance with dietary regimen: Secondary | ICD-10-CM

## 2014-07-06 DIAGNOSIS — Z91119 Patient's noncompliance with dietary regimen due to unspecified reason: Secondary | ICD-10-CM

## 2014-07-06 DIAGNOSIS — N400 Enlarged prostate without lower urinary tract symptoms: Secondary | ICD-10-CM

## 2014-07-06 DIAGNOSIS — Z91199 Patient's noncompliance with other medical treatment and regimen due to unspecified reason: Secondary | ICD-10-CM

## 2014-07-06 DIAGNOSIS — Z9114 Patient's other noncompliance with medication regimen: Secondary | ICD-10-CM

## 2014-07-06 DIAGNOSIS — E1151 Type 2 diabetes mellitus with diabetic peripheral angiopathy without gangrene: Secondary | ICD-10-CM

## 2014-07-06 DIAGNOSIS — Z91148 Patient's other noncompliance with medication regimen for other reason: Secondary | ICD-10-CM

## 2014-07-06 MED ORDER — HYDROCODONE-ACETAMINOPHEN 5-325 MG PO TABS: 1.0000 | ORAL_TABLET | Freq: Four times a day (QID) | ORAL | Status: DC | PRN

## 2014-07-06 NOTE — Patient Instructions (Addendum)
Please return for bloodwork on or after October 20th.  Please drop off a log of your blood sugars at that time  Diabetes and Standards of Medical Care Diabetes is complicated. You may find that your diabetes team includes a dietitian, nurse, diabetes educator, eye doctor, and more. To help everyone know what is going on and to help you get the care you deserve, the following schedule of care was developed to help keep you on track. Below are the tests, exams, vaccines, medicines, education, and plans you will need. HbA1c test This test shows how well you have controlled your glucose over the past 2-3 months. It is used to see if your diabetes management plan needs to be adjusted.   It is performed at least 2 times a year if you are meeting treatment goals.  It is performed 4 times a year if therapy has changed or if you are not meeting treatment goals. Blood pressure test  This test is performed at every routine medical visit. The goal is less than 140/90 mm Hg for most people, but 130/80 mm Hg in some cases. Ask your health care provider about your goal. Dental exam  Follow up with the dentist regularly. Eye exam  If you are diagnosed with type 1 diabetes as a child, get an exam upon reaching the age of 78 years or older and have had diabetes for 3-5 years. Yearly eye exams are recommended after that initial eye exam.  If you are diagnosed with type 1 diabetes as an adult, get an exam within 5 years of diagnosis and then yearly.  If you are diagnosed with type 2 diabetes, get an exam as soon as possible after the diagnosis and then yearly. Foot care exam  Visual foot exams are performed at every routine medical visit. The exams check for cuts, injuries, or other problems with the feet.  A comprehensive foot exam should be done yearly. This includes visual inspection as well as assessing foot pulses and testing for loss of sensation.  Check your feet nightly for cuts, injuries, or other  problems with your feet. Tell your health care provider if anything is not healing. Kidney function test (urine microalbumin)  This test is performed once a year.  Type 1 diabetes: The first test is performed 5 years after diagnosis.  Type 2 diabetes: The first test is performed at the time of diagnosis.  A serum creatinine and estimated glomerular filtration rate (eGFR) test is done once a year to assess the level of chronic kidney disease (CKD), if present. Lipid profile (cholesterol, HDL, LDL, triglycerides)  Performed every 5 years for most people.  The goal for LDL is less than 100 mg/dL. If you are at high risk, the goal is less than 70 mg/dL.  The goal for HDL is 40 mg/dL-50 mg/dL for men and 50 mg/dL-60 mg/dL for women. An HDL cholesterol of 60 mg/dL or higher gives some protection against heart disease.  The goal for triglycerides is less than 150 mg/dL. Influenza vaccine, pneumococcal vaccine, and hepatitis B vaccine  The influenza vaccine is recommended yearly.  It is recommended that people with diabetes who are over 78 years old get the pneumonia vaccine. In some cases, two separate shots may be given. Ask your health care provider if your pneumonia vaccination is up to date.  The hepatitis B vaccine is also recommended for adults with diabetes. Diabetes self-management education  Education is recommended at diagnosis and ongoing as needed. Treatment plan  Your treatment plan is reviewed at every medical visit. Document Released: 08/04/2009 Document Revised: 02/21/2014 Document Reviewed: 03/09/2013 [facility] Patient Information 2015 [place], [place]. This information is not intended to replace advice given to you by your health care provider. Make sure you discuss any questions you have with your health care provider.

## 2014-07-06 NOTE — Progress Notes (Signed)
Patient ID: Henry Lindsey, male   DOB: 11-01-1932, 80 y.o.   MRN: [id number]   Patient Active Problem List   Diagnosis Date Noted  . Noncompliance with diet and medication regimen 07/09/2014  . Bursitis of hip, right 04/05/2014  . Glaucoma 04/05/2014  . BPH (benign prostatic hyperplasia) 04/05/2014  . CKD (chronic kidney disease), stage III 12/28/2013  . Peripheral autonomic neuropathy due to DM 12/28/2013  . Overweight (BMI 25.0-29.9) 11/30/2013  . Unspecified vitamin D deficiency 11/03/2013  . Impotence due to erectile dysfunction 06/01/2013  . Routine general medical examination at a health care facility 04/04/2013  . CAD (coronary artery disease) 07/21/2012  . Peripheral vascular disease in diabetes mellitus   . Occlusion and stenosis of carotid artery with cerebral infarction   . PAD (peripheral artery disease) 07/01/2012  . Basal Cell Carcinoma Of Cheek 08/05/2011  . 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 Type II or unspecified type diabetes mellitus with peripheral circulatory disorders, uncontrolled(250.72) 08/05/2011  . Hyperlipidemia   . Hypertension   . CVA (cerebral infarction)     Subjective:  CC:   Chief Complaint  Patient presents with  . Follow-up  . Diabetes    HPI:   Henry Lindsey is a 80 y.o. male who presents for  Follow up on uncontrolled DM Type 2 with  PAD, AD and hypertension .  He was referred to Endocrinology for assistance in managing chronically uncontrolled DM and refused to go.  He did not bring his log of blood sugars with him today and continues to insist that his sugars are under control despite my discussion of his last a1c in July pointing to the contrarr  Lab Results  Component Value Date   HGBA1C 8.8* 05/09/2014     He is taking 15 units in the am and 20 units in the evening of 75/25 mixed insulin,  He has not titrated his dose or brought his readings in periodically for titration as offered .   He states that all of his blood sugars are between  130 to 16.  He continues to refuse referral to endocrinologist or go to a diabetic education class . Still, eating biscuits a few times a week,  Watermelon frequently. Limits sodas to rare use, drinks water most of the time.  Has changed his breakfast  cereal in the morning all bran with no banana,   Had a checkup with Dr Lucky Cowboy recently and reports "my circulation is fine."      Past Medical History  Diagnosis Date  . Diabetes mellitus   . Neuromuscular disorder   . Hyperlipidemia   . Hypertension   . CVA (cerebral infarction) feb 2012    r thalamic lacunar  . Peripheral vascular disease in diabetes mellitus Sept 2013     95% occlusion s/p PTCA R SFA Dew Sept 2013  . Carotid artery stenosis Feb 2012    <50% bilaterally    Past Surgical History  Procedure Laterality Date  . Upper gastrointestinal endoscopy    . Joint replacement Left 2014    left knee       The following portions of the patient's history were reviewed and updated as appropriate: Allergies, current medications, and problem list.    Review of Systems:   Patient denies headache, fevers, malaise, unintentional weight loss, skin rash, eye pain, sinus congestion and sinus pain, sore throat, dysphagia,  hemoptysis , cough, dyspnea, wheezing, chest pain, palpitations, orthopnea, edema, abdominal pain, nausea, melena, diarrhea, constipation, flank  pain, dysuria, hematuria, urinary  Frequency, nocturia, numbness, tingling, seizures,  Focal weakness, Loss of consciousness,  Tremor, insomnia, depression, anxiety, and suicidal ideation.     History   Social History  . Marital Status: Married    Spouse Name: Enid Derry, deceased    Number of Children: 2  . Years of Education: 12   Occupational History  .     Social History Main Topics  . Smoking status: Former Smoker    Quit date: 04/21/1968  . Smokeless tobacco: Never Used     Comment: quit 1969  . Alcohol Use: No  . Drug Use: No  . Sexual Activity: Yes     Partners: Female   Other Topics Concern  . Not on file   Social History Narrative   Widowed in 2014; dating again     Objective:  Filed Vitals:   07/06/14 1450  BP: 134/68  Pulse: 83  Temp: 98.6 F (37 C)  Resp: 16     General appearance: alert, cooperative and appears stated age Ears: normal TM's and external ear canals both ears Throat: lips, mucosa, and tongue normal; teeth and gums normal Neck: no adenopathy, no carotid bruit, supple, symmetrical, trachea midline and thyroid not enlarged, symmetric, no tenderness/mass/nodules Back: symmetric, no curvature. ROM normal. No CVA tenderness. Lungs: clear to auscultation bilaterally Heart: regular rate and rhythm, S1, S2 normal, no murmur, click, rub or gallop Abdomen: soft, non-tender; bowel sounds normal; no masses,  no organomegaly Pulses: 2+ and symmetric Skin: Skin color, texture, turgor normal. No rashes or lesions Lymph nodes: Cervical, supraclavicular, and axillary nodes normal.  Assessment and Plan:  Peripheral vascular disease in diabetes mellitus Managed with statin, regular exercises, aspirin and prior interventions by Dr Lucky Cowboy. . Currently no claudication symptoms with exercise.   Type II or unspecified type diabetes mellitus with peripheral circulatory disorders, uncontrolled(250.72) Lab Results  Component Value Date   HGBA1C 8.8* 05/09/2014  Uncontrolled, due to noncompliance with diet and diabetes low glycemic index diet . He refuses endocrine referral.  Will increase his insulin when he brings in his log of blood sugars as requested.      Noncompliance with diet and medication regimen I have offered more intensive surveillance of diabetes at no cost to patient but he has declined the additional assistance.    Hyperlipidemia Well controlled on current statin and fenofibrate therapy.   Liver enzymes were normal in July  , no changes today.  Lab Results  Component Value Date   CHOL 123 05/09/2014   HDL  42.00 05/09/2014   LDLCALC 60 05/09/2014   LDLDIRECT 67.8 02/15/2013   TRIG 104.0 05/09/2014   CHOLHDL 3 05/09/2014   Lab Results  Component Value Date   ALT 22 05/09/2014   AST 20 05/09/2014   ALKPHOS 44 05/09/2014   BILITOT 0.6 05/09/2014        BPH (benign prostatic hyperplasia) Continue follow up with [name]   Updated Medication List Outpatient Encounter Prescriptions as of 07/06/2014  Medication Sig  . amLODipine (NORVASC) 10 MG tablet TAKE 1 TABLET BY MOUTH EVERY DAY  . aspirin EC 81 MG tablet Take 1 tablet (81 mg total) by mouth daily.  [name] atorvastatin (LIPITOR) 40 MG tablet TAKE 1 TABLET BY MOUTH EVERY DAY  . fenofibrate micronized (LOFIBRA) 134 MG capsule TAKE 1 CAPSULE BY MOUTH EVERY MORNING BEFORE BREAKFAST  . gabapentin (NEURONTIN) 300 MG capsule TAKE 1 CAPSULE BY MOUTH THREE TIMES DAILY  . glipiZIDE (GLUCOTROL) 10 MG  tablet TAKE 1 TABLET BY MOUTH TWICE DAILY  . glucose blood (BAYER CONTOUR TEST) test strip Use three times daily as directed Dx: 250.72  . HUMALOG MIX 75/25 (75-25) 100 UNIT/ML SUSP injection INJECT 15 UNITS UNDER THE SKIN BEFORE BREAKFAST AND 17 UNITS BEFORE DINNER  . HUMULIN R 100 UNIT/ML injection INJECT 10 UNITS UNDER THE SKIN THREE TIMES DAILY BEFORE MEALS  . HYDROcodone-acetaminophen (NORCO/VICODIN) 5-325 MG per tablet Take 1 tablet by mouth every 6 (six) hours as needed.  . insulin lispro protamine-lispro (HUMALOG MIX 75/25) (75-25) 100 UNIT/ML SUSP injection INJECT 15 UNITS UNDER THE SKIN BEFORE BREAKFAST AND 18 UNITS BEFORE DINNER  . INSULIN SYRINGE .5CC/29G 29G X 1/2" 0.5 ML MISC 1 Syringe by Does not apply route 3 (three) times daily.  . Insulin Syringe-Needle U-100 (INSULIN SYRINGE .5CC/31GX5/16") 31G X 5/16" 0.5 ML MISC USE THREE TIMES DAILY  . losartan (COZAAR) 100 MG tablet TAKE 1 TABLET BY MOUTH EVERY DAY  . metFORMIN (GLUCOPHAGE) 1000 MG tablet TAKE 1 TABLET BY MOUTH TWICE DAILY  . metoprolol succinate (TOPROL-XL) 100 MG 24 hr tablet TAKE 1  TABLET BY MOUTH ONCE DAILY  . tamsulosin (FLOMAX) 0.4 MG CAPS capsule Take 0.4 mg by mouth daily after breakfast.  . traMADol (ULTRAM) 50 MG tablet   . [DISCONTINUED] HYDROcodone-acetaminophen (NORCO/VICODIN) 5-325 MG per tablet Take 1 tablet by mouth every 6 (six) hours as needed.  . [DISCONTINUED] HYDROcodone-acetaminophen (NORCO/VICODIN) 5-325 MG per tablet Take 1 tablet by mouth every 6 (six) hours as needed.  . [DISCONTINUED] HYDROcodone-acetaminophen (NORCO/VICODIN) 5-325 MG per tablet Take 1 tablet by mouth every 6 (six) hours as needed.     No orders of the defined types were placed in this encounter.    No Follow-up on file.

## 2014-07-06 NOTE — Progress Notes (Signed)
Pre-visit discussion using our clinic review tool. No additional management support is needed unless otherwise documented below in the visit note.  

## 2014-07-09 DIAGNOSIS — Z91199 Patient's noncompliance with other medical treatment and regimen due to unspecified reason: Secondary | ICD-10-CM | POA: Insufficient documentation

## 2014-07-09 DIAGNOSIS — Z9111 Patient's noncompliance with dietary regimen: Secondary | ICD-10-CM | POA: Insufficient documentation

## 2014-07-09 DIAGNOSIS — Z9114 Patient's other noncompliance with medication regimen: Secondary | ICD-10-CM

## 2014-07-09 NOTE — Assessment & Plan Note (Signed)
I have offered more intensive surveillance of diabetes at no cost to patient but he has declined the additional assistance.

## 2014-07-09 NOTE — Assessment & Plan Note (Addendum)
Managed with statin, regular exercises, aspirin and prior interventions by Dr Lucky Cowboy. . Currently no claudication symptoms with exercise.

## 2014-07-09 NOTE — Assessment & Plan Note (Signed)
Continue follow up with [name]

## 2014-07-09 NOTE — Assessment & Plan Note (Signed)
Well controlled on current statin and fenofibrate therapy.   Liver enzymes were normal in July  , no changes today.  Lab Results  Component Value Date   CHOL 123 05/09/2014   HDL 42.00 05/09/2014   LDLCALC 60 05/09/2014   LDLDIRECT 67.8 02/15/2013   TRIG 104.0 05/09/2014   CHOLHDL 3 05/09/2014   Lab Results  Component Value Date   ALT 22 05/09/2014   AST 20 05/09/2014   ALKPHOS 44 05/09/2014   BILITOT 0.6 05/09/2014

## 2014-07-09 NOTE — Assessment & Plan Note (Addendum)
Lab Results  Component Value Date   HGBA1C 8.8* 05/09/2014  Uncontrolled, due to noncompliance with diet and diabetes low glycemic index diet . He refuses endocrine referral.  Will increase his insulin when he brings in his log of blood sugars as requested.

## 2014-07-11 ENCOUNTER — Other Ambulatory Visit: Payer: Self-pay | Admitting: Internal Medicine

## 2014-07-24 ENCOUNTER — Telehealth: Payer: Self-pay | Admitting: Internal Medicine

## 2014-07-24 DIAGNOSIS — E1151 Type 2 diabetes mellitus with diabetic peripheral angiopathy without gangrene: Secondary | ICD-10-CM

## 2014-07-24 NOTE — Telephone Encounter (Signed)
Fastings are averaging 144 and post prandials 150 on  Current regimem of 15 units in the am and 20 units in the pm .  Patent advised to increase the evening dose to 22 units . Next a1c is due on or after Oct 20th

## 2014-07-24 NOTE — Assessment & Plan Note (Signed)
Fastings are averaging 144 and post prandials 150 on  Current regimem of 15 units in the am and 20 units in the pm .  Patent advised to increase the evening dose to 22 units . Next a1c is due on or after Oct 20th

## 2014-07-25 ENCOUNTER — Other Ambulatory Visit: Payer: Self-pay | Admitting: Internal Medicine

## 2014-07-25 NOTE — Telephone Encounter (Signed)
Pt notified and  verbalized understanding. Has lab appt scheduled 08/09/14

## 2014-08-09 ENCOUNTER — Other Ambulatory Visit (INDEPENDENT_AMBULATORY_CARE_PROVIDER_SITE_OTHER): Payer: Medicare Other

## 2014-08-09 ENCOUNTER — Telehealth: Payer: Self-pay | Admitting: Internal Medicine

## 2014-08-09 DIAGNOSIS — E1151 Type 2 diabetes mellitus with diabetic peripheral angiopathy without gangrene: Secondary | ICD-10-CM

## 2014-08-09 DIAGNOSIS — E1159 Type 2 diabetes mellitus with other circulatory complications: Secondary | ICD-10-CM

## 2014-08-09 LAB — COMPREHENSIVE METABOLIC PANEL
ALT: 27 U/L (ref 0–53)
AST: 24 U/L (ref 0–37)
Albumin: 3.8 g/dL (ref 3.5–5.2)
Alkaline Phosphatase: 52 U/L (ref 39–117)
BILIRUBIN TOTAL: 0.6 mg/dL (ref 0.2–1.2)
BUN: 13 mg/dL (ref 6–23)
CALCIUM: 9.5 mg/dL (ref 8.4–10.5)
CHLORIDE: 103 meq/L (ref 96–112)
CO2: 23 meq/L (ref 19–32)
Creatinine, Ser: 1.4 mg/dL (ref 0.4–1.5)
GFR: 54.15 mL/min — ABNORMAL LOW (ref >60.00)
Glucose, Bld: 168 mg/dL — ABNORMAL HIGH (ref 70–99)
Potassium: 4.9 mEq/L (ref 3.5–5.1)
SODIUM: 141 meq/L (ref 135–145)
TOTAL PROTEIN: 7.4 g/dL (ref 6.0–8.3)

## 2014-08-09 LAB — HEMOGLOBIN A1C: Hgb A1c MFr Bld: 8.7 % — ABNORMAL HIGH (ref 4.6–6.5)

## 2014-08-09 NOTE — Telephone Encounter (Signed)
Pt dropped off blood sugar results. List is in Dr. Lupita Dawn box. Pt also request rx for voltaren gel for his foot.msn

## 2014-08-09 NOTE — Telephone Encounter (Signed)
Patient stated his toes hurt when he does exercises and wanted the Voltaren gel  To relax the muscle, Patient has a follow up appointment 12/15. Please advise.

## 2014-08-09 NOTE — Telephone Encounter (Signed)
His prolbem may be a circulation problems with the voltaren will not help,  But you can refill it,  ifd it doesn't help he needs to see vascular surgery and may require referral

## 2014-08-10 MED ORDER — DICLOFENAC SODIUM 1 % TD GEL: 2.0000 g | Freq: Four times a day (QID) | TRANSDERMAL | Status: DC

## 2014-08-10 NOTE — Telephone Encounter (Signed)
Refill sent.

## 2014-08-11 ENCOUNTER — Other Ambulatory Visit: Payer: Self-pay | Admitting: Internal Medicine

## 2014-08-14 ENCOUNTER — Other Ambulatory Visit: Payer: Self-pay | Admitting: Internal Medicine

## 2014-08-14 DIAGNOSIS — IMO0002 Reserved for concepts with insufficient information to code with codable children: Secondary | ICD-10-CM

## 2014-08-14 DIAGNOSIS — E1165 Type 2 diabetes mellitus with hyperglycemia: Secondary | ICD-10-CM

## 2014-08-17 ENCOUNTER — Ambulatory Visit (INDEPENDENT_AMBULATORY_CARE_PROVIDER_SITE_OTHER): Payer: Medicare Other | Admitting: Endocrinology

## 2014-08-17 ENCOUNTER — Encounter: Payer: Self-pay | Admitting: Endocrinology

## 2014-08-17 VITALS — BP 130/66 | HR 79 | Wt 206.2 lb

## 2014-08-17 DIAGNOSIS — IMO0002 Reserved for concepts with insufficient information to code with codable children: Secondary | ICD-10-CM

## 2014-08-17 DIAGNOSIS — E785 Hyperlipidemia, unspecified: Secondary | ICD-10-CM

## 2014-08-17 DIAGNOSIS — E1151 Type 2 diabetes mellitus with diabetic peripheral angiopathy without gangrene: Secondary | ICD-10-CM

## 2014-08-17 DIAGNOSIS — I10 Essential (primary) hypertension: Secondary | ICD-10-CM

## 2014-08-17 DIAGNOSIS — N183 Chronic kidney disease, stage 3 unspecified: Secondary | ICD-10-CM

## 2014-08-17 DIAGNOSIS — E1159 Type 2 diabetes mellitus with other circulatory complications: Secondary | ICD-10-CM

## 2014-08-17 DIAGNOSIS — E1165 Type 2 diabetes mellitus with hyperglycemia: Principal | ICD-10-CM

## 2014-08-17 LAB — HM DIABETES FOOT EXAM: HM DIABETIC FOOT EXAM: NORMAL

## 2014-08-17 MED ORDER — INSULIN LISPRO PROT & LISPRO (75-25 MIX) 100 UNIT/ML ~~LOC~~ SUSP: SUBCUTANEOUS | Status: DC

## 2014-08-17 MED ORDER — SITAGLIPTIN PHOSPHATE 50 MG PO TABS: 50.0000 mg | ORAL_TABLET | Freq: Every day | ORAL | Status: DC

## 2014-08-17 NOTE — Patient Instructions (Signed)
  Check sugars 3 times daily ( fasting and premeal readings at alternating times, or at bedtime or at 2 hour after meals).  Record them in a sugar log and bring log and meter to next appointment.   Watch your portion sizes. Try to control the starches.   Continue current Humalog 75/25 doses for now.   Start Januvia 50 mg daily at lunch time.  Continue current glipizide and metformin.   Please come back for a follow-up appointment in 1 month.

## 2014-08-17 NOTE — Progress Notes (Signed)
Reason for visit-  Henry Lindsey is a 80 y.o.-year-old male, referred by his PCP,  [name], MD for management of Type 2 diabetes, uncontrolled, with complications ( PVD, CAD, neuropathy, ED, Stage 3 CKD). Has been non compliant with recommendations by PCP.   HPI- Patient has been diagnosed with diabetes in 2005. Recalls being initially on lifestyle modifications.  Tried Metformin, Glipizide since diagnosis and continues on these medications. Also, been on insulin since around 2012. Tried regular insulin before this premixed insulin.   Pt is currently on a regimen of: - Metformin 1000 mg po bid - Humalog 75/25  At 15 units BF and 20 units supper - Glipizide 10 mg twice daily   Last hemoglobin A1c was: A1c s this year have been trending up possibly due to recent intra-articular steroid injections  Lab Results  Component Value Date   HGBA1C 8.7* 08/09/2014   HGBA1C 8.8* 05/09/2014   HGBA1C 8.8* 02/04/2014     Pt checks his sugars 4 times weekly . Uses Bayer glucometer. By recall they are:  PREMEAL Breakfast Lunch Dinner Bedtime Overall  Glucose range: 135-110- 140 98 140-195 195   Mean/median:         Hypoglycemia-  No lows. ; he has hypoglycemia awareness at 70.  Gets weakness as symptom of lows.   Dietary habits- eats three times daily. Tries to limit carbs, sweetened beverages, sodas, desserts. Occasional desserts- 2 times week ( candy). Reports using dietary chart given by Dr Derrel Nip. Not interested in going to nutrition.  Exercise- every other day- leg exercises, chin up, pull ups. Limited by hip problem.  Weight - stable Wt Readings from Last 3 Encounters:  08/17/14 206 lb 4 oz (93.554 kg)  07/06/14 205 lb 4 oz (93.101 kg)  04/05/14 204 lb 8 oz (92.761 kg)    Diabetes Complications-  Nephropathy- Yes Stage3  CKD, last BUN/creatinine-  Lab Results  Component Value Date   BUN 13 08/09/2014   CREATININE 1.4 08/09/2014   Lab Results  Component Value Date   GFR  54.15* 08/09/2014   Lab Results  Component Value Date   MICRALBCREAT 5.2 05/09/2014    Retinopathy- No, Last DEE was 6 months ago, going for another one tomorrow  Neuropathy- Has numbness and tingling in his feet. Known neuropathy. Being Treated with gabapentin and topical cream.   Associated history - Has CAD . Prior mini stroke. No hypothyroidism. his last TSH was  Lab Results  Component Value Date   TSH 1.34 07/29/2013    Hyperlipidemia-  his last set of lipids were- Currently on lipitor 40 mg daily and fenofibrate 134 mg. Tolerating well.   Lab Results  Component Value Date   CHOL 123 05/09/2014   HDL 42.00 05/09/2014   LDLCALC 60 05/09/2014   LDLDIRECT 67.8 02/15/2013   TRIG 104.0 05/09/2014   CHOLHDL 3 05/09/2014    Blood Pressure/HTN- Patient's blood pressure is well controlled today on current regimen that includes ARB.  Pt has FH of DM in mother.  I have reviewed the patient's past medical history, family and social history, surgical history, medications and allergies.  Past Medical History  Diagnosis Date  . Diabetes mellitus   . Neuromuscular disorder   . Hyperlipidemia   . Hypertension   . CVA (cerebral infarction) feb 2012    r thalamic lacunar  . Peripheral vascular disease in diabetes mellitus Sept 2013     95% occlusion s/p PTCA R SFA Dew Sept 2013  .  Carotid artery stenosis Feb 2012    <50% bilaterally   Past Surgical History  Procedure Laterality Date  . Upper gastrointestinal endoscopy    . Joint replacement Left 2014    left knee   History   Social History  . Marital Status: Married    Spouse Name: Enid Derry, deceased    Number of Children: 2  . Years of Education: 12   Occupational History  .     Social History Main Topics  . Smoking status: Former Smoker    Quit date: 04/21/1968  . Smokeless tobacco: Never Used     Comment: quit 1969  . Alcohol Use: No  . Drug Use: No  . Sexual Activity: Yes    Partners: Female   Other Topics Concern   . Not on file   Social History Narrative   Widowed in 2014; dating again    Current Outpatient Prescriptions on File Prior to Visit  Medication Sig Dispense Refill  . amLODipine (NORVASC) 10 MG tablet TAKE 1 TABLET BY MOUTH EVERY DAY  30 tablet  0  . aspirin EC 81 MG tablet Take 1 tablet (81 mg total) by mouth daily.  90 tablet  3  . atorvastatin (LIPITOR) 40 MG tablet TAKE 1 TABLET BY MOUTH EVERY DAY  90 tablet  1  . diclofenac sodium (VOLTAREN) 1 % GEL Apply 2 g topically 4 (four) times daily.  100 g  1  . fenofibrate micronized (LOFIBRA) 134 MG capsule TAKE 1 CAPSULE BY MOUTH EVERY MORNING BEFORE BREAKFAST  30 capsule  5  . gabapentin (NEURONTIN) 300 MG capsule TAKE 1 CAPSULE BY MOUTH THREE TIMES DAILY  90 capsule  2  . glipiZIDE (GLUCOTROL) 10 MG tablet TAKE 1 TABLET BY MOUTH TWICE DAILY  60 tablet  3  . glucose blood (BAYER CONTOUR TEST) test strip Use three times daily as directed Dx: 250.72  100 each  5  . HYDROcodone-acetaminophen (NORCO/VICODIN) 5-325 MG per tablet Take 1 tablet by mouth every 6 (six) hours as needed.  90 tablet  0  . INSULIN SYRINGE .5CC/29G 29G X 1/2" 0.5 ML MISC 1 Syringe by Does not apply route 3 (three) times daily.  100 each  6  . Insulin Syringe-Needle U-100 (INSULIN SYRINGE .5CC/31GX5/16") 31G X 5/16" 0.5 ML MISC USE THREE TIMES DAILY  100 each  5  . losartan (COZAAR) 100 MG tablet TAKE 1 TABLET BY MOUTH EVERY DAY  30 tablet  0  . metFORMIN (GLUCOPHAGE) 1000 MG tablet TAKE 1 TABLET BY MOUTH TWICE DAILY  180 tablet  2  . metoprolol succinate (TOPROL-XL) 100 MG 24 hr tablet TAKE 1 TABLET BY MOUTH ONCE DAILY  30 tablet  5  . tamsulosin (FLOMAX) 0.4 MG CAPS capsule Take 0.4 mg by mouth daily after breakfast.      . traMADol (ULTRAM) 50 MG tablet        No current facility-administered medications on file prior to visit.   No Known Allergies Family History  Problem Relation Age of Onset  . Diabetes Mother       Review of Systems: [x]  complains of  [  ]  denies General:   [  ] Recent weight change [  ] Fatigue  [  ] Loss of appetite Eyes: [  ]  Vision Difficulty [  ]  Eye pain ENT: [  ]  Hearing difficulty [  ]  Difficulty Swallowing CVS: [  ] Chest pain [  ]  Palpitations/Irregular Heart  beat [  ]  Shortness of breath lying flat [  ] Swelling of legs Resp: [  ] Frequent Cough [  ] Shortness of Breath  [  ]  Wheezing GI: [  ] Heartburn  [  ] Nausea or Vomiting  [  ] Diarrhea [  ] Constipation  [  ] Abdominal Pain GU: [  ]  Polyuria  [ x ]  nocturia Bones/joints:  [  ]  Muscle aches  [ x ] Joint Pain  [ x ] Bone pain Skin/Hair/Nails: [  ]  Rash  [  ] New stretch marks [  ]  Itching [  ] Hair loss [  ]  Excessive hair growth Reproduction: [  ] Low sexual desire , [  ]  Women: Menstrual cycle problems [  ]  Women: Breast Discharge [  ] Men: Difficulty with erections [  ]  Men: Enlarged Breasts CNS: [  ] Frequent Headaches [  ] Blurry vision [  ] Tremors [  ] Seizures [  ] Loss of consciousness [  ] Localized weakness Endocrine: [  ]  Excess thirst [  ]  Feeling excessively hot [  ]  Feeling excessively cold Heme: [  ]  Easy bruising [  ]  Enlarged glands or lumps in neck Allergy: [  ]  Food allergies [  ] Environmental allergies  PE: BP 130/66  Pulse 79  Wt 206 lb 4 oz (93.554 kg)  SpO2 97% Wt Readings from Last 3 Encounters:  08/17/14 206 lb 4 oz (93.554 kg)  07/06/14 205 lb 4 oz (93.101 kg)  04/05/14 204 lb 8 oz (92.761 kg)   GENERAL: No acute distress, well developed HEENT:  Eye exam shows normal external appearance. Oral exam shows normal mucosa .  NECK:   Neck exam shows no lymphadenopathy. No Carotids bruits. Thyroid is not enlarged and no nodules felt.  no acanthosis nigricans LUNGS:         Chest is symmetrical. Lungs are clear to auscultation.[name]   HEART:         Heart sounds:  S1 and S2 are normal. No murmurs or clicks heard. ABDOMEN:  No Distention present. Liver and spleen are not palpable. No other mass or tenderness present.   EXTREMITIES:     There is no edema. 1-2+ DP pulses  NEUROLOGICAL:     Grossly intact.            Diabetic foot exam done with shoes and socks removed: Normal Monofilament testing bilaterally. No deformities of toes.  Bunions great toes, Nails  Not dystrophic-missing left great toe from fungal infection. Skin normal color. No open wounds. Dry skin.  MUSCULOSKELETAL:       There is no enlargement or gross deformity of the joints.  SKIN:       No rash  ASSESSMENT AND PLAN: 1. Type 2 DM, uncontrolled 2. Stage 3 CKD 3. HTN 4. Hyperlipidemia Problem List Items Addressed This Visit     Cardiovascular and Mediastinum   Hypertension     BP at target today. Last urine MA negative 04/2014. Is on losartan.     Type 2 diabetes, uncontrolled, with peripheral circulatory disorder - Primary     A1c is above target at this time. Discussed that he probably has had a progression of his Diabetes over the years, along with recent use of steroids raising his sugars.   He is not checking his sugars enough, and hence  they are not congruent with his A1c. Offered CGM testing, however he declined. He is agreeable to check sugars 2-3 times daily and bring log to next appointments.   Discussed various medication options to improve his sugars including basal/bolus regimen, addition of GLP-1 versus DPPiv inhibitors. He reports that he is in Donut hole now, and is not sure which of these medications  He will be able to afford.   Agreeable to try Januvia 50 mg daily ( have given him a free month coupon). In the interim, he will also check at which GLP-1 is covered under his plan.  Depending on his sugars over the next month, may require to switch to GLP-1 then. Continue current Metformin, Glipizide, Humalog 75/25 dosing.   Agreeable to cut back on starches and portion sizes.   We had a long discussion on importance of getting his A1c down  to about 7.5-8% given his age and comorbidities, this would be a reasonable  goal. He voiced understanding to above and plan discussed.   RTC 1 month.      Relevant Medications      insulin lispro protamine-lispro (HUMALOG MIX 75/25) (75-25) 100 UNIT/ML SUSP injection      sitaGLIPtin (JANUVIA) tablet     Genitourinary   CKD (chronic kidney disease), stage III     Recent stable CKD, stage 3      Other   Hyperlipidemia      Last levels at goal on current therapy. Continue.        - Return to clinic in 1 mo with sugar log/meter.  Jamoni Broadfoot [facility] 08/17/2014 12:07 PM

## 2014-08-17 NOTE — Assessment & Plan Note (Signed)
BP at target today. Last urine MA negative 04/2014. Is on losartan.        

## 2014-08-17 NOTE — Progress Notes (Signed)
Pre visit review using our clinic review tool, if applicable. No additional management support is needed unless otherwise documented below in the visit note. 

## 2014-08-17 NOTE — Assessment & Plan Note (Addendum)
A1c is above target at this time. Discussed that he probably has had a progression of his Diabetes over the years, along with recent use of steroids raising his sugars.   He is not checking his sugars enough, and hence they are not congruent with his A1c. Offered CGM testing, however he declined. He is agreeable to check sugars 2-3 times daily and bring log to next appointments.   Discussed various medication options to improve his sugars including basal/bolus regimen, addition of GLP-1 versus DPPiv inhibitors. He reports that he is in Donut hole now, and is not sure which of these medications  He will be able to afford.   Agreeable to try Januvia 50 mg daily ( have given him a free month coupon). In the interim, he will also check at which GLP-1 is covered under his plan.  Depending on his sugars over the next month, may require to switch to GLP-1 then. Continue current Metformin, Glipizide, Humalog 75/25 dosing.   Agreeable to cut back on starches and portion sizes.   We had a long discussion on importance of getting his A1c down  to about 7.5-8% given his age and comorbidities, this would be a reasonable goal. He voiced understanding to above and plan discussed.   RTC 1 month.

## 2014-08-17 NOTE — Assessment & Plan Note (Signed)
Last levels at goal on current therapy. Continue.

## 2014-08-17 NOTE — Assessment & Plan Note (Signed)
Recent stable CKD, stage 3    

## 2014-08-20 ENCOUNTER — Other Ambulatory Visit: Payer: Self-pay | Admitting: Internal Medicine

## 2014-08-24 ENCOUNTER — Other Ambulatory Visit: Payer: Self-pay | Admitting: Internal Medicine

## 2014-08-30 ENCOUNTER — Other Ambulatory Visit: Payer: Self-pay | Admitting: *Deleted

## 2014-08-30 MED ORDER — GLUCOSE BLOOD VI STRP: ORAL_STRIP | Status: DC

## 2014-09-01 ENCOUNTER — Telehealth: Payer: Self-pay | Admitting: Internal Medicine

## 2014-09-01 ENCOUNTER — Other Ambulatory Visit: Payer: Self-pay | Admitting: Endocrinology

## 2014-09-01 ENCOUNTER — Telehealth: Payer: Self-pay | Admitting: *Deleted

## 2014-09-01 DIAGNOSIS — E1165 Type 2 diabetes mellitus with hyperglycemia: Principal | ICD-10-CM

## 2014-09-01 DIAGNOSIS — IMO0002 Reserved for concepts with insufficient information to code with codable children: Secondary | ICD-10-CM

## 2014-09-01 DIAGNOSIS — E1151 Type 2 diabetes mellitus with diabetic peripheral angiopathy without gangrene: Secondary | ICD-10-CM

## 2014-09-01 MED ORDER — LINAGLIPTIN 5 MG PO TABS: 5.0000 mg | ORAL_TABLET | Freq: Every day | ORAL | Status: DC

## 2014-09-01 NOTE — Telephone Encounter (Signed)
Henry Lindsey stopped by saying since he's been taking Januvia, he's noticed every night between 1a-2am his left arm is numb to the point where he awakens. He said it takes about an hour and a half before it goes away. With his hx of stroke, he's concerned about this and is wondering if he should be. He said the numbness is almost the same as he felt when he had his stroke. He feels the medication is working and dropped off his Blood Sugar Report for Dr. Derrel Nip and Dr. Howell Rucks to see. Please call the patient.  Pt's ph# [phone] Thank you.

## 2014-09-01 NOTE — Telephone Encounter (Signed)
Dr Howell Rucks is managing his diabetes now.  Please forward to her

## 2014-09-01 NOTE — Telephone Encounter (Signed)
Please review previous message and advise. Thank you 

## 2014-09-01 NOTE — Telephone Encounter (Signed)
Spoke to patient to notify him of Dr. Boyd Kerbs comments. Patient verbalized understanding. Tradjenta was sent to [facility] pharmacy and patient requested it be [facility]. I cancelled Rx at [facility] and sent it to [facility]. Patient stated he would come by the office to pick up the discount card.

## 2014-09-01 NOTE — Telephone Encounter (Signed)
This is not a typical side effect of this medication, but I guess possible because I have had rare patients report about this particular side effect with other medications that work similar to this. Best way to test is to stop the Januvia and switch to Tradjenta 5 mg daily ( works similarly- have 30 day free trial coupon).  If symptoms continue despite making this switch then we can look into other causes.   The addition of this medication seems to be working well.  fastings sugars 100-135 Lunch time 136-150s mostly, with 1 reading at 78, other at 226  Dinner time 153-287 Bedtime 115-246    His sugars are rising up higher during the day and lower in the morning.  Change insulin 70/30 doses to 16 units am and 18 units supper ( instead of 15 units am and 20 units supper).  Keep upcoming appt for follow up.  Good job with FS checks!

## 2014-09-01 NOTE — Telephone Encounter (Signed)
Fax from [place], needing PA for [employer]. PA started online, pending response

## 2014-09-01 NOTE — Telephone Encounter (Signed)
Henry Lindsey dropped off his Blood Sugar Report paperwork for Dr. Derrel Nip. I've placed the paperwork in her box. Thank you.

## 2014-09-01 NOTE — Telephone Encounter (Signed)
Placing with results

## 2014-09-01 NOTE — Telephone Encounter (Signed)
Forwarded

## 2014-09-02 MED ORDER — GLUCOSE BLOOD VI STRP: ORAL_STRIP | Status: DC

## 2014-09-02 NOTE — Addendum Note (Signed)
Addended by: Wynonia Lawman E on: 09/02/2014 10:48 AM   Modules accepted: Orders

## 2014-09-02 NOTE — Telephone Encounter (Signed)
Pt has stopped Januvia. Started Tradjenta, had 30 day free trial. Will call with update next week on symptoms. Needs test stripe to [facility], Rx sent to pharmacy by escript

## 2014-09-02 NOTE — Telephone Encounter (Addendum)
Fax from insurance, [place] denied for [employer]. Paperwork placed in Phadke's box.

## 2014-09-02 NOTE — Telephone Encounter (Signed)
Will sign it when back in office. The patient can still go ahead and stop Januvia and see if his symptoms improve.

## 2014-09-06 ENCOUNTER — Telehealth: Payer: Self-pay | Admitting: Endocrinology

## 2014-09-06 NOTE — Telephone Encounter (Signed)
Needs appt

## 2014-09-06 NOTE — Telephone Encounter (Signed)
Yes, please refer him to Dr Derrel Nip for further evaluation. I thought his numbness had improved after stopping the [place], but it appears that it hasn't completely resolved after stopping the medication.

## 2014-09-06 NOTE — Telephone Encounter (Signed)
Pt has started taking the new meds. Pt stated that he is still having some numbs in fingers. No longer having the numbness in arms and wrist. Pt stated that he will continue taking meds for 30 days.msn

## 2014-09-06 NOTE — Telephone Encounter (Signed)
FYI

## 2014-09-06 NOTE — Telephone Encounter (Signed)
Patient is having left arm numbness at night. Dr. Howell Rucks thought it maybe related to medication so she change drug therapy. Patient stated that symptoms have not improved since changing medication. Dr. Howell Rucks believes patient should be seen by PCP for further evaluation. Please advise patient.

## 2014-09-06 NOTE — Telephone Encounter (Signed)
Please advise 

## 2014-09-06 NOTE — Telephone Encounter (Signed)
Could you get some details on this numbness in his fingers- it is new? Constant? Neck pain? If the numbness is new onset, best to defer this evaluation to Dr Derrel Nip. thanks

## 2014-09-06 NOTE — Telephone Encounter (Signed)
Patient called on 09/01/14 stating since starting Januvia patient had left arm numbness. Per Dr. Howell Rucks patient told to stop Januvia and start Tradjenta that  Day. Spoke to patient today and he stated that the left arm is constantly numb around 1am for about a hour and a half every night. No neck pain per patient. Onset of symptoms started when patient started taking Januvia. Should patient follow up with PCP? Please advise

## 2014-09-07 NOTE — Telephone Encounter (Signed)
Yes,  It is fine,  If it is occurring only at night it is likely related ot heck arthritis and therefore chronic

## 2014-09-07 NOTE — Telephone Encounter (Signed)
See unrouted response 

## 2014-09-07 NOTE — Telephone Encounter (Signed)
Earliest app. 10/07/14 that ok?

## 2014-09-07 NOTE — Telephone Encounter (Signed)
Patient stated was nothing wrong until he started taking the new medication Dr. Howell Rucks has him, Patient is convinced that it is the new medication stated he has called Dr, Howell Rucks Nurse today and is waiting foor a call back and that he does not want an X-Ray at this time.

## 2014-09-07 NOTE — Telephone Encounter (Signed)
The patient has an appointment on 10/07/14.  Do you want him worked in sooner?  There are no available appointment slots on Dr.Tullo's schedule.

## 2014-09-07 NOTE — Telephone Encounter (Signed)
Please schedule patient appointment first available.

## 2014-09-09 ENCOUNTER — Other Ambulatory Visit: Payer: Self-pay | Admitting: Internal Medicine

## 2014-09-20 ENCOUNTER — Other Ambulatory Visit: Payer: Self-pay | Admitting: Internal Medicine

## 2014-09-21 ENCOUNTER — Ambulatory Visit: Payer: Medicare Other | Admitting: Endocrinology

## 2014-10-01 ENCOUNTER — Other Ambulatory Visit: Payer: Self-pay | Admitting: Internal Medicine

## 2014-10-07 ENCOUNTER — Encounter: Payer: Self-pay | Admitting: Internal Medicine

## 2014-10-07 ENCOUNTER — Ambulatory Visit (INDEPENDENT_AMBULATORY_CARE_PROVIDER_SITE_OTHER): Payer: Medicare Other | Admitting: Internal Medicine

## 2014-10-07 VITALS — BP 140/76 | HR 68 | Temp 97.8°F | Resp 14 | Wt 203.2 lb

## 2014-10-07 DIAGNOSIS — G629 Polyneuropathy, unspecified: Secondary | ICD-10-CM

## 2014-10-07 DIAGNOSIS — N138 Other obstructive and reflux uropathy: Secondary | ICD-10-CM

## 2014-10-07 DIAGNOSIS — E1151 Type 2 diabetes mellitus with diabetic peripheral angiopathy without gangrene: Secondary | ICD-10-CM

## 2014-10-07 DIAGNOSIS — M1712 Unilateral primary osteoarthritis, left knee: Secondary | ICD-10-CM

## 2014-10-07 DIAGNOSIS — N401 Enlarged prostate with lower urinary tract symptoms: Secondary | ICD-10-CM

## 2014-10-07 LAB — POCT URINALYSIS DIPSTICK
BILIRUBIN UA: NEGATIVE
Glucose, UA: NEGATIVE
Ketones, UA: NEGATIVE
LEUKOCYTES UA: NEGATIVE
NITRITE UA: NEGATIVE
Protein, UA: NEGATIVE
RBC UA: NEGATIVE
Spec Grav, UA: 1.015
UROBILINOGEN UA: 0.2
pH, UA: 7.5

## 2014-10-07 MED ORDER — HYDROCODONE-ACETAMINOPHEN 5-325 MG PO TABS: 1.0000 | ORAL_TABLET | Freq: Four times a day (QID) | ORAL | Status: DC | PRN

## 2014-10-07 MED ORDER — MIRABEGRON ER 25 MG PO TB24: 25.0000 mg | ORAL_TABLET | Freq: Every day | ORAL | Status: DC

## 2014-10-07 NOTE — Progress Notes (Signed)
Pre visit review using our clinic review tool, if applicable. No additional management support is needed unless otherwise documented below in the visit note. 

## 2014-10-07 NOTE — Progress Notes (Signed)
Patient ID: Henry Lindsey, male   DOB: 1933/05/08, 80 y.o.   MRN: [id number]  Patient Active Problem List   Diagnosis Date Noted  . Peripheral neuropathy 10/09/2014  . BPH with obstruction/lower urinary tract symptoms 10/09/2014  . Noncompliance with diet and medication regimen 07/09/2014  . Bursitis of hip, right 04/05/2014  . Glaucoma 04/05/2014  . BPH (benign prostatic hyperplasia) 04/05/2014  . CKD (chronic kidney disease), stage III 12/28/2013  . Peripheral autonomic neuropathy due to DM 12/28/2013  . Overweight (BMI 25.0-29.9) 11/30/2013  . Unspecified vitamin D deficiency 11/03/2013  . Impotence due to erectile dysfunction 06/01/2013  . Routine general medical examination at a health care facility 04/04/2013  . CAD (coronary artery disease) 07/21/2012  . Peripheral vascular disease in diabetes mellitus   . Occlusion and stenosis of carotid artery with cerebral infarction   . PAD (peripheral artery disease) 07/01/2012  . Basal Cell Carcinoma Of Cheek 08/05/2011  . 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 Type 2 diabetes, uncontrolled, with peripheral circulatory disorder 08/05/2011  . Hyperlipidemia   . Hypertension   . CVA (cerebral infarction)     Subjective:  CC:   Chief Complaint  Patient presents with  . Follow-up    needs 90 supply on long term meds refills.    HPI:   Henry Lindsey is a 80 y.o. male who presents for  Follow up on chronic conditions, excluding DM Type 2 which is managed by Dr Karolee Stamps due to loss of control.  Has been having nocturia every hour  Despite daily use of tamsulosin for the past 2 years prescribed by [name].  Reviewed liquid and caffeine intake and there is no indication for modification   Still having a lot of jonit pain in left knee despite undergoing  joint replacement and right knee is now bothering him too  He has been having episode of both hands becoming numb. He denies neck pain and neck injury,  The episodes Occur sporadically.  He  believe it may be a vascular problem and plans to talk to dr dew about it.   Has goood radial artery pulse and no neck pain no occupation overuse so CTS is unlikely     Past Medical History  Diagnosis Date  . Diabetes mellitus   . Neuromuscular disorder   . Hyperlipidemia   . Hypertension   . CVA (cerebral infarction) feb 2012    r thalamic lacunar  . Peripheral vascular disease in diabetes mellitus Sept 2013     95% occlusion s/p PTCA R SFA Dew Sept 2013  . Carotid artery stenosis Feb 2012    <50% bilaterally    Past Surgical History  Procedure Laterality Date  . Upper gastrointestinal endoscopy    . Joint replacement Left 2014    left knee       The following portions of the patient's history were reviewed and updated as appropriate: Allergies, current medications, and problem list.    Review of Systems:   Patient denies headache, fevers, malaise, unintentional weight loss, skin rash, eye pain, sinus congestion and sinus pain, sore throat, dysphagia,  hemoptysis , cough, dyspnea, wheezing, chest pain, palpitations, orthopnea, edema, abdominal pain, nausea, melena, diarrhea, constipation, flank pain, dysuria, hematuria, urinary  Frequency, nocturia, numbness, tingling, seizures,  Focal weakness, Loss of consciousness,  Tremor, insomnia, depression, anxiety, and suicidal ideation.     History   Social History  . Marital Status: Married    Spouse Name: Henry Lindsey, deceased  Number of Children: 2  . Years of Education: 12   Occupational History  .     Social History Main Topics  . Smoking status: Former Smoker    Quit date: 04/21/1968  . Smokeless tobacco: Never Used     Comment: quit 1969  . Alcohol Use: No  . Drug Use: No  . Sexual Activity:    Partners: Female   Other Topics Concern  . Not on file   Social History Narrative   Widowed in 2014; dating again     Objective:  Filed Vitals:   10/07/14 1336  BP: 140/76  Pulse: 68  Temp: 97.8 F (36.6  C)  Resp: 14     General appearance: alert, cooperative and appears stated age Ears: normal TM's and external ear canals both ears Throat: lips, mucosa, and tongue normal; teeth and gums normal Neck: no adenopathy, no carotid bruit, supple, symmetrical, trachea midline and thyroid not enlarged, symmetric, no tenderness/mass/nodules Back: symmetric, no curvature. ROM normal. No CVA tenderness. Lungs: clear to auscultation bilaterally Heart: regular rate and rhythm, S1, S2 normal, no murmur, click, rub or gallop Abdomen: soft, non-tender; bowel sounds normal; no masses,  no organomegaly Pulses: 2+ and symmetric radially Skin: Skin color, texture, turgor normal. No rashes or lesions Lymph nodes: Cervical, supraclavicular, and axillary nodes normal. MSK: bilateral knee pain with full extension, no effusions.   Assessment and Plan:  Hypertension .htn  Peripheral vascular disease in diabetes mellitus He is due for follow up with Dr. Lucky Cowboy,  He is taking a statin, , fenofibrate, and daily asa .  Radial pulses are excellent.lipids are at goal . DM control is improving with use of insulin and management by Dr Karolee Stamps  Lab Results  Component Value Date   CHOL 123 05/09/2014   HDL 42.00 05/09/2014   LDLCALC 60 05/09/2014   LDLDIRECT 67.8 02/15/2013   TRIG 104.0 05/09/2014   CHOLHDL 3 05/09/2014     BPH with obstruction/lower urinary tract symptoms Adding myrbetiq to flomax for urinary frequency.  UA was normal   Peripheral neuropathy Etiology unclear.  No history of CTS or B12 deficiency but has uncontrolled DM.  Advised him o consider referral for EMG/nerve conduction studies done, but he has deferred for now.   Lab Results  Component Value Date   TSH 1.34 07/29/2013   No results found for: VITAMINB12   Left knee DJD S/p left knee TKR and episodic steroid injections by Margaretmary Eddy right knee.  Continue hydrocodone and aleve refills given    Updated Medication  List Outpatient Encounter Prescriptions as of 10/07/2014  Medication Sig  . amLODipine (NORVASC) 10 MG tablet TAKE 1 TABLET BY MOUTH EVERY DAY  . aspirin EC 81 MG tablet Take 1 tablet (81 mg total) by mouth daily.  [name] atorvastatin (LIPITOR) 40 MG tablet TAKE 1 TABLET BY MOUTH EVERY DAY  . diclofenac sodium (VOLTAREN) 1 % GEL Apply 2 g topically 4 (four) times daily.  . fenofibrate micronized (LOFIBRA) 134 MG capsule TAKE 1 CAPSULE BY MOUTH EVERY MORNING BEFORE BREAKFAST  . gabapentin (NEURONTIN) 300 MG capsule TAKE 1 CAPSULE BY MOUTH THREE TIMES DAILY  . glipiZIDE (GLUCOTROL) 10 MG tablet TAKE 1 TABLET BY MOUTH TWICE DAILY  . glucose blood (BAYER CONTOUR TEST) test strip Use three times daily as directed Dx: E11.59  . HUMALOG MIX 75/25 (75-25) 100 UNIT/ML SUSP injection INJECT 15 UNITS UNDER THE SKIN BEFORE BREAKFAST AND 17 UNITS BEFORE DINNER EVERY DAY  . HYDROcodone-acetaminophen (  NORCO/VICODIN) 5-325 MG per tablet Take 1 tablet by mouth every 6 (six) hours as needed.  . insulin lispro protamine-lispro (HUMALOG MIX 75/25) (75-25) 100 UNIT/ML SUSP injection Inject 15 units [place] every morning and 20units every evening  . INSULIN SYRINGE .5CC/29G 29G X 1/2" 0.5 ML MISC 1 Syringe by Does not apply route 3 (three) times daily.  . Insulin Syringe-Needle U-100 (INSULIN SYRINGE .5CC/31GX5/16") 31G X 5/16" 0.5 ML MISC USE THREE TIMES DAILY  . linagliptin (TRADJENTA) 5 MG TABS tablet Take 1 tablet (5 mg total) by mouth daily.  [name] losartan (COZAAR) 100 MG tablet TAKE 1 TABLET BY MOUTH EVERY DAY  . metFORMIN (GLUCOPHAGE) 1000 MG tablet TAKE 1 TABLET BY MOUTH TWICE DAILY  . metoprolol succinate (TOPROL-XL) 100 MG 24 hr tablet TAKE 1 TABLET BY MOUTH ONCE DAILY  . tamsulosin (FLOMAX) 0.4 MG CAPS capsule Take 0.4 mg by mouth daily after breakfast.  . traMADol (ULTRAM) 50 MG tablet   . [DISCONTINUED] HYDROcodone-acetaminophen (NORCO/VICODIN) 5-325 MG per tablet Take 1 tablet by mouth every 6 (six) hours as  needed.  . [DISCONTINUED] HYDROcodone-acetaminophen (NORCO/VICODIN) 5-325 MG per tablet Take 1 tablet by mouth every 6 (six) hours as needed.  . [DISCONTINUED] HYDROcodone-acetaminophen (NORCO/VICODIN) 5-325 MG per tablet Take 1 tablet by mouth every 6 (six) hours as needed.  . mirabegron ER (MYRBETRIQ) 25 MG TB24 tablet Take 1 tablet (25 mg total) by mouth daily.     Orders Placed This Encounter  Procedures  . CULTURE, URINE COMPREHENSIVE  . Urinalysis, dipstick only  . PSA, Medicare  . B12  . TSH  . POCT urinalysis dipstick    Return in about 6 months (around 04/08/2015).

## 2014-10-07 NOTE — Patient Instructions (Addendum)
I am adding a medication called Myrbetriq to help your overactive bladder .  It is taken once daily .  DO NOT STOP YOUR FLOMAX (Tamsulosin) .  YOU NEED BOTH  Please make your appt with Dr cope if the new medication does not help your symptoms.  Your hand numbness is due to a Neuropathy.  This may be coming from your diabetes, or from a pinched nerve in your neck ,  Or from bilateral carpal tunnel syndrome  You will need a referral to Neurology for "Nerve conduction studies."

## 2014-10-09 ENCOUNTER — Encounter: Payer: Self-pay | Admitting: Internal Medicine

## 2014-10-09 DIAGNOSIS — E1140 Type 2 diabetes mellitus with diabetic neuropathy, unspecified: Secondary | ICD-10-CM | POA: Insufficient documentation

## 2014-10-09 DIAGNOSIS — N138 Other obstructive and reflux uropathy: Secondary | ICD-10-CM | POA: Insufficient documentation

## 2014-10-09 DIAGNOSIS — N401 Enlarged prostate with lower urinary tract symptoms: Principal | ICD-10-CM

## 2014-10-09 LAB — CULTURE, URINE COMPREHENSIVE
Colony Count: NO GROWTH
ORGANISM ID, BACTERIA: NO GROWTH

## 2014-10-09 NOTE — Assessment & Plan Note (Signed)
htn

## 2014-10-09 NOTE — Assessment & Plan Note (Signed)
S/p left knee TKR and episodic steroid injections by Margaretmary Eddy right knee.  Continue hydrocodone and aleve refills given

## 2014-10-09 NOTE — Assessment & Plan Note (Signed)
Etiology unclear.  No history of CTS or B12 deficiency but has uncontrolled DM.  Advised him o consider referral for EMG/nerve conduction studies done, but he has deferred for now.   Lab Results  Component Value Date   TSH 1.34 07/29/2013   No results found for: [id number]

## 2014-10-09 NOTE — Assessment & Plan Note (Addendum)
Adding myrbetiq to flomax for urinary frequency.  UA was normal

## 2014-10-09 NOTE — Assessment & Plan Note (Signed)
He is due for follow up with Dr. Lucky Cowboy,  He is taking a statin, , fenofibrate, and daily asa .  Radial pulses are excellent.lipids are at goal . DM control is improving with use of insulin and management by Dr Karolee Stamps  Lab Results  Component Value Date   CHOL 123 05/09/2014   HDL 42.00 05/09/2014   LDLCALC 60 05/09/2014   LDLDIRECT 67.8 02/15/2013   TRIG 104.0 05/09/2014   CHOLHDL 3 05/09/2014

## 2014-10-10 ENCOUNTER — Other Ambulatory Visit: Payer: Medicare Other

## 2014-10-10 DIAGNOSIS — L4050 Arthropathic psoriasis, unspecified: Secondary | ICD-10-CM

## 2014-10-11 LAB — PSA: PSA: 0.83 ng/mL (ref <=4.00)

## 2014-10-20 ENCOUNTER — Other Ambulatory Visit: Payer: Self-pay | Admitting: Internal Medicine

## 2014-10-27 ENCOUNTER — Telehealth: Payer: Self-pay | Admitting: Endocrinology

## 2014-10-27 ENCOUNTER — Other Ambulatory Visit: Payer: Self-pay | Admitting: *Deleted

## 2014-10-27 MED ORDER — INSULIN LISPRO PROT & LISPRO (75-25 MIX) 100 UNIT/ML ~~LOC~~ SUSP: SUBCUTANEOUS | Status: DC

## 2014-10-27 NOTE — Telephone Encounter (Signed)
Noted  

## 2014-10-27 NOTE — Telephone Encounter (Signed)
The patient will give you a call back after his appointment with Dr. Lucky Cowboy to schedule to see Dr. Howell Rucks.

## 2014-10-29 ENCOUNTER — Other Ambulatory Visit: Payer: Self-pay | Admitting: Internal Medicine

## 2014-11-28 ENCOUNTER — Other Ambulatory Visit: Payer: Self-pay | Admitting: Internal Medicine

## 2014-11-29 ENCOUNTER — Telehealth: Payer: Self-pay | Admitting: Internal Medicine

## 2014-11-29 ENCOUNTER — Other Ambulatory Visit: Payer: Self-pay | Admitting: *Deleted

## 2014-11-29 MED ORDER — GLIPIZIDE 10 MG PO TABS: 10.0000 mg | ORAL_TABLET | Freq: Two times a day (BID) | ORAL | Status: DC

## 2014-11-29 NOTE — Telephone Encounter (Signed)
glipiZIDE (GLUCOTROL) 10 MG tablet  #90

## 2014-12-13 ENCOUNTER — Other Ambulatory Visit: Payer: Self-pay | Admitting: Internal Medicine

## 2014-12-22 ENCOUNTER — Other Ambulatory Visit: Payer: Self-pay | Admitting: Internal Medicine

## 2014-12-27 ENCOUNTER — Other Ambulatory Visit: Payer: Self-pay | Admitting: Internal Medicine

## 2015-01-02 ENCOUNTER — Telehealth: Payer: Self-pay | Admitting: Internal Medicine

## 2015-01-02 MED ORDER — GLUCOSE BLOOD VI STRP: ORAL_STRIP | Status: DC

## 2015-01-02 NOTE — Telephone Encounter (Signed)
Pt came in today needing contour test strips called into [facility] in [place] pt completely out of test strips  Pt stated he is taking 10units of insulin in am only for 1 1/2 months  readings have going down  Reading in phadke box

## 2015-01-02 NOTE — Telephone Encounter (Signed)
Refills sent to pharmacy as requested by patient.

## 2015-01-04 ENCOUNTER — Telehealth: Payer: Self-pay

## 2015-01-04 NOTE — Telephone Encounter (Signed)
Spoke to patient to notify him of Dr. Boyd Kerbs comments. Patient verbalized understanding by repeating instructions back to me. Patient stated that he only takes the 75/25 10 units in the am, None taking in the evening.

## 2015-01-04 NOTE — Telephone Encounter (Signed)
-----   Message from Haydee Monica, MD sent at 01/04/2015 10:19 AM EDT ----- Regarding: call patient please Pt was last seen 07/2014. He needs to schedule follow up appt please in the next 2 weeks.   Reviewed sugars. He is starting to have some hypoglycemia.  I understand from the recent telephone note that he is taking 75/25 10 units in the am, is he taking any in the evening? He is still continuing the other oral medications- Glipizide, metformin and tradjenta.   Sugars are 56-183 now- a days.   Please ask him not to skip meals and decrease glipizide to 5 mg twice daily Instead of 10 mg twice daily ( he can break existing pill in half). Take insulin with BF and supper, skip shot if he skips meals. Check sugars 2-3 x daily and bring log to next appt.   thanks

## 2015-01-04 NOTE — Telephone Encounter (Signed)
Lets see what his readings show then when he checks more often. If between  Now and time of appt 3/31 he starts seeing higher numbers >200, then he should call me for further adjustments. thanks

## 2015-01-05 NOTE — Telephone Encounter (Signed)
Patient aware of dr. Boyd Kerbs comments.

## 2015-01-09 ENCOUNTER — Other Ambulatory Visit: Payer: Self-pay | Admitting: Internal Medicine

## 2015-01-09 ENCOUNTER — Telehealth: Payer: Self-pay

## 2015-01-09 ENCOUNTER — Other Ambulatory Visit: Payer: Self-pay | Admitting: *Deleted

## 2015-01-09 MED ORDER — HYDROCODONE-ACETAMINOPHEN 5-325 MG PO TABS: 1.0000 | ORAL_TABLET | Freq: Four times a day (QID) | ORAL | Status: DC | PRN

## 2015-01-09 NOTE — Telephone Encounter (Signed)
90 day supply authorized , needs appt in June .  No refills due until April  1

## 2015-01-09 NOTE — Telephone Encounter (Signed)
Last fill on after 12/20/14 ok to fill?

## 2015-01-09 NOTE — Telephone Encounter (Signed)
The patient is hoping to get a three month rx for his hyrdrocodone rx  Callback - [phone]

## 2015-01-09 NOTE — Telephone Encounter (Signed)
error 

## 2015-01-10 ENCOUNTER — Telehealth: Payer: Self-pay | Admitting: Internal Medicine

## 2015-01-10 MED ORDER — ONETOUCH ULTRA SYSTEM W/DEVICE KIT: 1.0000 | PACK | Freq: Once | Status: DC

## 2015-01-10 MED ORDER — GLUCOSE BLOOD VI STRP: ORAL_STRIP | Status: DC

## 2015-01-10 NOTE — Telephone Encounter (Signed)
Script for new Glucometer and for test strips sent to pharmacy as requested.

## 2015-01-10 NOTE — Telephone Encounter (Signed)
Patient notified and voiced understanding.

## 2015-01-10 NOTE — Telephone Encounter (Signed)
Patient is returning your call.  

## 2015-01-13 ENCOUNTER — Other Ambulatory Visit: Payer: Self-pay | Admitting: Internal Medicine

## 2015-01-19 ENCOUNTER — Encounter: Payer: Self-pay | Admitting: Endocrinology

## 2015-01-19 ENCOUNTER — Ambulatory Visit (INDEPENDENT_AMBULATORY_CARE_PROVIDER_SITE_OTHER): Payer: Medicare Other | Admitting: Endocrinology

## 2015-01-19 VITALS — BP 134/66 | HR 75 | Resp 14 | Ht 70.0 in | Wt 203.0 lb

## 2015-01-19 DIAGNOSIS — E1165 Type 2 diabetes mellitus with hyperglycemia: Principal | ICD-10-CM

## 2015-01-19 DIAGNOSIS — Z9114 Patient's other noncompliance with medication regimen: Secondary | ICD-10-CM

## 2015-01-19 DIAGNOSIS — G629 Polyneuropathy, unspecified: Secondary | ICD-10-CM

## 2015-01-19 DIAGNOSIS — E1151 Type 2 diabetes mellitus with diabetic peripheral angiopathy without gangrene: Secondary | ICD-10-CM

## 2015-01-19 DIAGNOSIS — Z9111 Patient's noncompliance with dietary regimen: Secondary | ICD-10-CM

## 2015-01-19 DIAGNOSIS — N183 Chronic kidney disease, stage 3 unspecified: Secondary | ICD-10-CM

## 2015-01-19 DIAGNOSIS — E785 Hyperlipidemia, unspecified: Secondary | ICD-10-CM

## 2015-01-19 DIAGNOSIS — IMO0002 Reserved for concepts with insufficient information to code with codable children: Secondary | ICD-10-CM

## 2015-01-19 DIAGNOSIS — I10 Essential (primary) hypertension: Secondary | ICD-10-CM | POA: Diagnosis not present

## 2015-01-19 DIAGNOSIS — Z91119 Patient's noncompliance with dietary regimen due to unspecified reason: Secondary | ICD-10-CM

## 2015-01-19 DIAGNOSIS — E1159 Type 2 diabetes mellitus with other circulatory complications: Secondary | ICD-10-CM | POA: Diagnosis not present

## 2015-01-19 LAB — COMPREHENSIVE METABOLIC PANEL WITH GFR
ALT: 24 U/L (ref 0–53)
AST: 20 U/L (ref 0–37)
Albumin: 4.5 g/dL (ref 3.5–5.2)
Alkaline Phosphatase: 47 U/L (ref 39–117)
BUN: 38 mg/dL — ABNORMAL HIGH (ref 6–23)
CO2: 29 meq/L (ref 19–32)
Calcium: 9.8 mg/dL (ref 8.4–10.5)
Chloride: 101 meq/L (ref 96–112)
Creatinine, Ser: 1.38 mg/dL (ref 0.40–1.50)
GFR: 52.73 mL/min — ABNORMAL LOW
Glucose, Bld: 195 mg/dL — ABNORMAL HIGH (ref 70–99)
Potassium: 4.7 meq/L (ref 3.5–5.1)
Sodium: 137 meq/L (ref 135–145)
Total Bilirubin: 0.4 mg/dL (ref 0.2–1.2)
Total Protein: 7.3 g/dL (ref 6.0–8.3)

## 2015-01-19 LAB — HEMOGLOBIN A1C: Hgb A1c MFr Bld: 7.9 % — ABNORMAL HIGH (ref 4.6–6.5)

## 2015-01-19 NOTE — Assessment & Plan Note (Addendum)
A1c is above target last year, Reports improvement in A1c - but I don't have the result.  Discussed that  recent use of steroids is probably raising his sugars. Update A1c today, along with CMP.   He is not checking his sugars enough, and I have asked him to check 2x daily to get a better idea of his sugar trend. He is agreeable to do this for now.   Discussed med changes based on his sugars. He will continue current medications for now. Encouraged him not to self adjust medications, but he reports that he is intelligent enough to make changes to his insulin. Asked him to notify me if does make any changes in the future.   Depending on his sugars and A1c , I would like to wean him off the SU, but he really wants to try and come off his insulin. Will assess at future visits.  Not agreeable to start GLP-1.

## 2015-01-19 NOTE — Patient Instructions (Signed)
Check sugars 2 x daily ( before breakfast and before supper).  Record them in a log book and bring that/meter to next appointment.  Check at various times of the day.  Donot make any medication adjustments on your own.  Continue taking the current medications for now for sugar control.  Notify if sugars start going below 100 or stay above 200.  Expect to decrease dose of insulin/Glipizide - patient would like to get off insulin if possible, will assess this based on sugar log next month.  Please come back for a follow-up appointment in 1 month. Labs today.

## 2015-01-19 NOTE — Assessment & Plan Note (Signed)
Last levels at goal on current therapy. Continue.  Update at next visit when fasting.

## 2015-01-19 NOTE — Assessment & Plan Note (Signed)
Regular self foot care. Is on neurontin and symptoms are well controlled.

## 2015-01-19 NOTE — Assessment & Plan Note (Signed)
BP at target today. Last urine MA negative 04/2014. Is on losartan.

## 2015-01-19 NOTE — Assessment & Plan Note (Signed)
Encouraged regular medical care. Advised him not to self adjust medications. He reports that he is intelligent enough to make changes to his insulin. Asked him to atleast notify me when he does- he is agreeable.

## 2015-01-19 NOTE — Progress Notes (Signed)
Reason for visit-  Henry Lindsey is a 81 y.o.-year-old male,  for follow up management of Type 2 diabetes, uncontrolled, with complications ( PVD, CAD, neuropathy, ED, Stage 3 CKD). Has been non compliant with recommendations by PCP. Last visit with me Oct 2015 and hasn't returned for follow up since then.    HPI- Patient has been diagnosed with diabetes in 2005. Recalls being initially on lifestyle modifications.  Tried Metformin, Glipizide since diagnosis and continues on these medications. Also, been on insulin since around 2012. Tried regular insulin before this premixed insulin.   *started Tradjenta 5 mg daily Nov 2015 * recently started to have low sugars if skipped meals and reports taking 75/25 insulin at 10 units in the morning only, no evening dose around mid March 2016 * recd 2 steroid injections in rt hip and rt knee 01/17/15  Pt is currently on a regimen of: - Metformin 1000 mg po bid - Humalog 75/25  At 15 units BF and 20 units supper in October 2015>>self adjusted dose to 10 units am and stopped evening dose around Dec 2015>>recd cortisone shot 01/17/15 and now taking 15 units qam , no evening dose - Glipizide 10 mg twice daily>>5 mg twice daily ( around [facility] March 2016 due to hypoglycemia) -Tradjenta 5 mg daily ( start Nov 2015)    Last hemoglobin A1c was: A1c s in 2015 have been trending up possibly due to recent intra-articular steroid injections  Reports that home nurse did his A1c around dec 2015 and it was around 7%  Lab Results  Component Value Date   HGBA1C 8.7* 08/09/2014   HGBA1C 8.8* 05/09/2014   HGBA1C 8.8* 02/04/2014     Pt checks his sugars 1 times day . Uses  One Touch glucometer. By sugar log they are:  PREMEAL Breakfast Lunch Dinner Bedtime Overall  Glucose range: 144-133- 180 198 164-170    Mean/median:         Hypoglycemia-  Some  Lows recently in the 50s after skipping meals>>no lows since dose reduced of Glipizide ; he has some hypoglycemia  awareness at 70.  Gets weakness as symptom of lows.   Dietary habits- eats three times daily again- not skipping meals specially lunch. Tries to limit carbs, sweetened beverages, sodas, desserts. Occasional desserts. Reports using dietary chart given by Dr Derrel Nip. Not interested in going to nutrition.  Exercise- every other day- leg exercises, chin up, pull ups. Limited by hip problem.  Weight - stable Wt Readings from Last 3 Encounters:  01/19/15 203 lb (92.08 kg)  10/07/14 203 lb 4 oz (92.194 kg)  08/17/14 206 lb 4 oz (93.554 kg)    Diabetes Complications-  Nephropathy- Yes Stage3  CKD, last BUN/creatinine-  Lab Results  Component Value Date   BUN 13 08/09/2014   CREATININE 1.4 08/09/2014   Lab Results  Component Value Date   GFR 54.15* 08/09/2014   Lab Results  Component Value Date   MICRALBCREAT 5.2 05/09/2014    Retinopathy- No, Last DEE was 6 months ago, going for another one 01/30/2015 Neuropathy- Has numbness and tingling in his feet. Known neuropathy. Being Treated with gabapentin and topical cream.   Associated history - Has CAD . Prior mini stroke. No hypothyroidism. his last TSH was  Lab Results  Component Value Date   TSH 1.34 07/29/2013    Hyperlipidemia-  his last set of lipids were- Currently on lipitor 40 mg daily and fenofibrate 134 mg. Tolerating well.  Not fasting today.  Lab Results  Component Value Date   CHOL 123 05/09/2014   HDL 42.00 05/09/2014   LDLCALC 60 05/09/2014   LDLDIRECT 67.8 02/15/2013   TRIG 104.0 05/09/2014   CHOLHDL 3 05/09/2014    Blood Pressure/HTN- Patient's blood pressure is well controlled today on current regimen that includes ARB.   I have reviewed the patient's past medical history, medications and allergies.   Current Outpatient Prescriptions on File Prior to Visit  Medication Sig Dispense Refill  . amLODipine (NORVASC) 10 MG tablet TAKE 1 TABLET BY MOUTH EVERY DAY 30 tablet 0  . aspirin EC 81 MG tablet Take 1 tablet  (81 mg total) by mouth daily. 90 tablet 3  . atorvastatin (LIPITOR) 40 MG tablet TAKE 1 TABLET BY MOUTH EVERY DAY 90 tablet 1  . Blood Glucose Monitoring Suppl (ONE TOUCH ULTRA SYSTEM KIT) W/DEVICE KIT 1 kit by Does not apply route once. Use DX code E11.59 1 each 0  . diclofenac sodium (VOLTAREN) 1 % GEL Apply 2 g topically 4 (four) times daily. 100 g 1  . fenofibrate micronized (LOFIBRA) 134 MG capsule TAKE 1 CAPSULE BY MOUTH EVERY MORNING BEFORE BREAKFAST 30 capsule 0  . gabapentin (NEURONTIN) 300 MG capsule TAKE 1 CAPSULE BY MOUTH THREE TIMES DAILY 90 capsule 0  . glipiZIDE (GLUCOTROL) 10 MG tablet Take 1 tablet (10 mg total) by mouth 2 (two) times daily. (Patient taking differently: Take 5 mg by mouth 2 (two) times daily. ) 60 tablet 6  . glucose blood test strip Use as instructed three times daily 100 each 12  . HYDROcodone-acetaminophen (NORCO/VICODIN) 5-325 MG per tablet Take 1 tablet by mouth every 6 (six) hours as needed. 90 tablet 0  . insulin lispro protamine-lispro (HUMALOG MIX 75/25) (75-25) 100 UNIT/ML SUSP injection Inject 15 units [place] every morning and 20units every evening (Patient taking differently: Inject 15 units [place] every morning) 10 mL 0  . INSULIN SYRINGE .5CC/29G 29G X 1/2" 0.5 ML MISC 1 Syringe by Does not apply route 3 (three) times daily. 100 each 6  . Insulin Syringe-Needle U-100 (INSULIN SYRINGE .5CC/31GX5/16") 31G X 5/16" 0.5 ML MISC USE THREE TIMES DAILY 100 each 5  . linagliptin (TRADJENTA) 5 MG TABS tablet Take 1 tablet (5 mg total) by mouth daily. 30 tablet 3  . losartan (COZAAR) 100 MG tablet TAKE 1 TABLET BY MOUTH EVERY DAY 30 tablet 5  . metFORMIN (GLUCOPHAGE) 1000 MG tablet TAKE 1 TABLET BY MOUTH TWICE DAILY 180 tablet 0  . metoprolol succinate (TOPROL-XL) 100 MG 24 hr tablet TAKE 1 TABLET BY MOUTH ONCE DAILY 30 tablet 5  . MYRBETRIQ 25 MG TB24 tablet TAKE 1 TABLET BY MOUTH DAILY 30 tablet 5  . tamsulosin (FLOMAX) 0.4 MG CAPS capsule Take 0.4 mg by mouth daily  after breakfast.    . traMADol (ULTRAM) 50 MG tablet      No current facility-administered medications on file prior to visit.   No Known Allergies Family History  Problem Relation Age of Onset  . Diabetes Mother     Review of Systems- [ x ]  Complains of    [  ]  denies [  ] Recent weight change [  ]  Fatigue [  ] polydipsia [  ] polyuria [ x ]  nocturia [  ]  vision difficulty [  ] chest pain [  ] shortness of breath [  ] leg swelling [  ] cough [  ] nausea/vomiting [  ] diarrhea [  ]  constipation [  ] abdominal pain [  ]  tingling/numbness in extremities [  ]  concern with feet ( wounds/sores)   PE: BP 134/66 mmHg  Pulse 75  Resp 14  Ht '5\' 10"'  (1.778 m)  Wt 203 lb (92.08 kg)  BMI 29.13 kg/m2  SpO2 97% Wt Readings from Last 3 Encounters:  01/19/15 203 lb (92.08 kg)  10/07/14 203 lb 4 oz (92.194 kg)  08/17/14 206 lb 4 oz (93.554 kg)   Exam: deferred  ASSESSMENT AND PLAN:  Problem List Items Addressed This Visit      Cardiovascular and Mediastinum   Hypertension    BP at target today. Last urine MA negative 04/2014. Is on losartan.         Relevant Orders   Comprehensive metabolic panel   Hemoglobin A1c   Type 2 diabetes, uncontrolled, with peripheral circulatory disorder - Primary    A1c is above target last year, Reports improvement in A1c - but I don't have the result.  Discussed that  recent use of steroids is probably raising his sugars. Update A1c today, along with CMP.   He is not checking his sugars enough, and I have asked him to check 2x daily to get a better idea of his sugar trend. He is agreeable to do this for now.   Discussed med changes based on his sugars. He will continue current medications for now. Encouraged him not to self adjust medications, but he reports that he is intelligent enough to make changes to his insulin. Asked him to notify me if does make any changes in the future.   Depending on his sugars and A1c , I would like to wean him  off the SU, but he really wants to try and come off his insulin. Will assess at future visits.  Not agreeable to start GLP-1.          Relevant Orders   Comprehensive metabolic panel   Hemoglobin A1c     Nervous and Auditory   Peripheral neuropathy    Regular self foot care. Is on neurontin and symptoms are well controlled.         Genitourinary   CKD (chronic kidney disease), stage III    Recent stable CKD, stage 3  Update today.       Relevant Orders   Comprehensive metabolic panel   Hemoglobin A1c     Other   Hyperlipidemia     Last levels at goal on current therapy. Continue.  Update at next visit when fasting.       Relevant Orders   Comprehensive metabolic panel   Hemoglobin A1c   Noncompliance with diet and medication regimen    Encouraged regular medical care. Advised him not to self adjust medications. He reports that he is intelligent enough to make changes to his insulin. Asked him to atleast notify me when he does- he is agreeable.       Relevant Orders   Comprehensive metabolic panel   Hemoglobin A1c       - Return to clinic in 1 mo with sugar log/meter.  Emonii Wienke [facility] 01/19/2015 10:34 AM

## 2015-01-19 NOTE — Assessment & Plan Note (Signed)
Recent stable CKD, stage 3  Update today.

## 2015-01-19 NOTE — Progress Notes (Signed)
Pre visit review using our clinic review tool, if applicable. No additional management support is needed unless otherwise documented below in the visit note. 

## 2015-01-20 ENCOUNTER — Telehealth: Payer: Self-pay

## 2015-01-20 MED ORDER — HYDROCODONE-ACETAMINOPHEN 5-325 MG PO TABS
1.0000 | ORAL_TABLET | Freq: Four times a day (QID) | ORAL | Status: DC | PRN
Start: 2015-01-20 — End: 2015-04-06

## 2015-01-20 NOTE — Telephone Encounter (Signed)
The patient came in to the office hoping to get a new rx printed.  The patient is needing a hyrdrocodone rx printed for April.  He is aware this has already been printed, however, he stated he has thrown away the original rx.  He states the was under the impression the pharmacy was holding the rx until it was time to be filled.  This is the second time the patient has walked in and is hoping to have this resolved.

## 2015-01-20 NOTE — Telephone Encounter (Signed)
Please advise. Verbal received fro m MD ok to refill script story was confirmed with pharmacy.

## 2015-01-26 ENCOUNTER — Other Ambulatory Visit: Payer: Self-pay | Admitting: Internal Medicine

## 2015-01-30 LAB — HM DIABETES EYE EXAM

## 2015-01-31 ENCOUNTER — Encounter: Payer: Self-pay | Admitting: *Deleted

## 2015-02-01 ENCOUNTER — Other Ambulatory Visit: Payer: Self-pay | Admitting: Internal Medicine

## 2015-02-07 NOTE — Op Note (Signed)
PATIENT NAME:  Henry, Lindsey MR#:  [id number] DATE OF BIRTH:  [birth year]  DATE OF PROCEDURE:  07/09/2012  PREOPERATIVE DIAGNOSES:  1. Peripheral arterial disease with claudication bilateral lower extremities.  2. Hypertension.  3. Diabetes.   POSTOPERATIVE DIAGNOSES:  1. Peripheral arterial disease with claudication bilateral lower extremities.  2. Hypertension.  3. Diabetes.   PROCEDURES:  1. Catheter placement into right popliteal artery from left femoral approach.  2. Aortogram and selective right lower extremity angiogram.  3. Percutaneous transluminal angioplasty of distal right superficial femoral artery with 6 mm diameter angioplasty balloon.  4. StarClose closure device, left femoral artery.   SURGEON: Algernon Huxley, MD  ANESTHESIA: Local with moderate conscious sedation.   ESTIMATED BLOOD LOSS: Minimal.   CONTRAST USED: 50 mL Visipaque.   FLUOROSCOPY TIME: Four minutes.   INDICATION FOR PROCEDURE: 81 year old white male with bilateral lower extremity claudication. His noninvasive study show more reduction in flow on the right than the left. We originally discussed options and he had a trial of conservative management but he called back desiring intervention due to worsening lifestyle limiting claudication. Risks and benefits were discussed. Informed consent was obtained.   DESCRIPTION OF PROCEDURE: Patient was brought to the vascular interventional radiology suite. Groins shaved and prepped, sterile surgical field was created. The left femoral head was localized with fluoroscopy and the left femoral artery was accessed without difficulty with a Seldinger needle. A J-wire and a 5 French sheath were placed. Pigtail catheter was placed in the aorta at the L1 level. AP aortogram was performed. This showed patent renal arteries and patent aorta and iliac segments with some mild stenosis that did not appear flow limiting. I then used a rim catheter and an advantage wire to hook the  aortic bifurcation and advance the rim catheter to the right femoral head and selective right lower extremity angiogram was then performed. This showed a patent common femoral artery, profunda femoris artery. The superficial femoral artery was patent proximally with some mild stenosis. There was a near occlusive stenosis just above Hunter's canal on the right that was in the 95% range. Below this, the popliteal artery was patent and there was good peroneal runoff and a small anterior tibial runoff distally. The patient was systemically heparinized. A 6 French Ansel sheath was placed over a Terumo advantage wire. I was able to cross the lesion without difficulty with a Terumo advantage wire and a Kumpe catheter and confirm intraluminal flow in the popliteal artery. I then replaced the wire and treated this area with 6 mm diameter x 6 cm length angioplasty balloon with excellent angiographic completion result and minimal residual stenosis. At this point, I elected to terminate the procedure. The sheath was pulled back to the ipsilateral external iliac artery and oblique arteriogram was performed. StarClose closure device deployed in the usual fashion with excellent hemostatic result. The patient tolerated procedure well and was taken to the recovery room in stable condition.   ____________________________ Algernon Huxley, MD jsd:cms D: 07/09/2012 09:57:07 ET T: 07/09/2012 14:31:10 ET JOB#: [id number]  cc: Algernon Huxley, MD, <Dictator> Algernon Huxley MD ELECTRONICALLY SIGNED 07/16/2012 13:42

## 2015-02-07 NOTE — Discharge Summary (Signed)
PATIENT NAME:  Henry Lindsey, Henry Lindsey MR#:  [id number] DATE OF BIRTH:  September 08, 1933  DATE OF ADMISSION:  08/26/2012 DATE OF DISCHARGE:  08/30/2012  DISCHARGE DIAGNOSES: 1. Severe degenerative arthritis, left knee.  2. Diabetes mellitus.  3. Hypertension. 4. Recent transient ischemic event.   OPERATIONS/PROCEDURES PERFORMED: Left total knee arthroplasty on 08/26/2012.   HISTORY AND PHYSICAL EXAMINATION: Is as written on the chart.   LABORATORY DATA: As noted on the chart.   COURSE IN HOSPITAL: The patient was fully evaluated medically preoperatively and cleared for surgery. He was taken to the Operating Room on 08/26/2012 and left total knee arthroplasty was performed without incident. He tolerated the procedure quite well. Postoperatively he was advanced up into the chair and physical therapy was begun on postoperative day #1. He did quite well with physical therapy. He progressed such that by 08/30/2012 the patient was felt to be able to be discharged home with home health physical therapy. He was discharged on his previous medications as taken at home. He is to take aspirin one 325 mg tablet per day. He is given a prescription for Percocet 1 tablet every three hours as necessary for pain. He is to return to the office in 10 days to see Dr. Tamala Julian for staple removal and exam.   ____________________________ Lucas Mallow, MD ces:cms D: 09/10/2012 13:21:11 ET T: 09/10/2012 13:29:43 ET JOB#: [id number]  cc: Lucas Mallow, MD, <Dictator> Lucas Mallow MD ELECTRONICALLY SIGNED 09/11/2012 6:43

## 2015-02-07 NOTE — Op Note (Signed)
PATIENT NAME:  Henry Lindsey, Henry Lindsey MR#:  [id number] DATE OF BIRTH:  1932-11-11  DATE OF PROCEDURE:  08/26/2012  PREOPERATIVE DIAGNOSIS: Severe degenerative arthritis, left knee.   POSTOPERATIVE DIAGNOSIS: Severe degenerative arthritis, left knee.   PROCEDURE: LCS total knee arthroplasty.   SURGEON: Lucas Mallow, MD  ASSISTANT: Earnestine Leys, M.D.   ANESTHESIA: Spinal.   COMPLICATIONS: None.   TOURNIQUET TIME: 100 minutes.   DESCRIPTION OF PROCEDURE: 1 gram of Ancef was given intravenously prior to the procedure. Spinal anesthesia is induced and the patient is secured in the usual manner on the Operating Room table. The left lower extremity is thoroughly prepped with alcohol and ChloraPrep and draped in standard sterile fashion. The extremity is wrapped out with the Esmarch bandage and pneumatic tourniquet elevated to 325 mmHg. Standard anterior longitudinal incision is made and the dissection carried down to the medial and lateral retinaculum. Medial retinaculum is incised in a peripatellar fashion and the knee flexed and the patella reflected laterally. Standard osteophyte and synovial debridement is performed. Medial release is performed by elevating the joint capsule, the pes anserinus and incising a small amount of the tibial collateral ligament. The knee is then flexed and the retractors are placed and the tibial cutting block is pinned into place and the proximal tibial cut made. The posterior aspect of the knee is cleared of all debris. The large cutting block for the anterior and posterior surfaces of the femur is chosen and this is drilled into place. Femoral guide positioner is put into place and is seen to be a good fit with good alignment. The block is pinned into place and the anterior and posterior femoral cuts are made. The 5 degrees valgus distal femoral cutting block is impacted into place and pinned. Distal femoral cuts are then made. Spacer block is inserted in both flexion  and extension 10 mm and is seen to be satisfactory with good ligamentous stability. Femoral shaping guide is impacted into place and the appropriate cuts made. #5 tibial tray template is pinned into place and the central hole made. Trial components are inserted and are seen to be a good fit with excellent range of motion. The patella is prepared in the usual manner for a large patella. All trial components are then removed and the joint is thoroughly irrigated multiple times with pulsatile lavage. The #5 LCS tibial tray is cemented into place. The polyethylene rotating platform is put into position. The large porous-coated femoral component is impacted into place with a small amount of cement in both the distal holes and in the supracondylar notch. The knee fully extends and is seen to be in good position. The patella was clamped into place using a porous-coated technique. Wound is thoroughly irrigated multiple times again. Medial retinaculum is repaired with #2 Ethibond, subcutaneous tissue is closed with 3-0 Vicryl and the skin is closed with the skin stapler. Two Autovac drains were brought out through separate stab wound incisions. Skin is closed with an interrupted subcuticular 3-0 nylon sutures and then the staples. Soft bulky dressing is applied along with a Polar Care and a knee immobilizer and the patient is returned to recovery room in satisfactory condition having tolerated the procedure quite well.   ____________________________ Lucas Mallow, MD ces:cms D: 08/26/2012 12:58:08 ET T: 08/26/2012 14:05:51 ET JOB#: [id number]  cc: Lucas Mallow, MD, <Dictator>  Lucas Mallow MD ELECTRONICALLY SIGNED 08/26/2012 17:56

## 2015-02-22 ENCOUNTER — Other Ambulatory Visit: Payer: Self-pay | Admitting: Internal Medicine

## 2015-02-23 ENCOUNTER — Ambulatory Visit (INDEPENDENT_AMBULATORY_CARE_PROVIDER_SITE_OTHER): Payer: Medicare Other | Admitting: Endocrinology

## 2015-02-23 ENCOUNTER — Encounter: Payer: Self-pay | Admitting: Internal Medicine

## 2015-02-23 ENCOUNTER — Ambulatory Visit (INDEPENDENT_AMBULATORY_CARE_PROVIDER_SITE_OTHER): Payer: Medicare Other | Admitting: Internal Medicine

## 2015-02-23 ENCOUNTER — Encounter: Payer: Self-pay | Admitting: Endocrinology

## 2015-02-23 VITALS — BP 136/82 | HR 74 | Resp 14 | Ht 70.0 in | Wt 200.5 lb

## 2015-02-23 VITALS — BP 136/82 | HR 74 | Temp 97.6°F | Resp 14 | Ht 70.0 in | Wt 200.5 lb

## 2015-02-23 DIAGNOSIS — I10 Essential (primary) hypertension: Secondary | ICD-10-CM

## 2015-02-23 DIAGNOSIS — N183 Chronic kidney disease, stage 3 unspecified: Secondary | ICD-10-CM

## 2015-02-23 DIAGNOSIS — J069 Acute upper respiratory infection, unspecified: Secondary | ICD-10-CM

## 2015-02-23 DIAGNOSIS — M25579 Pain in unspecified ankle and joints of unspecified foot: Secondary | ICD-10-CM | POA: Diagnosis not present

## 2015-02-23 DIAGNOSIS — E1151 Type 2 diabetes mellitus with diabetic peripheral angiopathy without gangrene: Secondary | ICD-10-CM

## 2015-02-23 DIAGNOSIS — E1159 Type 2 diabetes mellitus with other circulatory complications: Secondary | ICD-10-CM

## 2015-02-23 DIAGNOSIS — IMO0002 Reserved for concepts with insufficient information to code with codable children: Secondary | ICD-10-CM

## 2015-02-23 DIAGNOSIS — E785 Hyperlipidemia, unspecified: Secondary | ICD-10-CM

## 2015-02-23 DIAGNOSIS — E1165 Type 2 diabetes mellitus with hyperglycemia: Principal | ICD-10-CM

## 2015-02-23 MED ORDER — PREDNISONE 10 MG PO TABS: ORAL_TABLET | ORAL | Status: DC

## 2015-02-23 MED ORDER — FAMOTIDINE 20 MG PO TABS: 20.0000 mg | ORAL_TABLET | Freq: Two times a day (BID) | ORAL | Status: DC

## 2015-02-23 NOTE — Assessment & Plan Note (Signed)
BP at target today. Last urine MA negative 04/2014. Is on losartan.

## 2015-02-23 NOTE — Progress Notes (Signed)
Pre visit review using our clinic review tool, if applicable. No additional management support is needed unless otherwise documented below in the visit note. 

## 2015-02-23 NOTE — Patient Instructions (Addendum)
Your cough may be coming from reflux, allergies, or post nasal drip (PND).    YOU HAVE NO SIGNS OF A BACTERIAL INFECTION SO YOU SHOULD NO TAKE A Z PACK OR OTHER ANTIBIOTIC AT THIS TIME  Please start taking generic benadryl availabel(  OTC ) at bedtime  and generic Pepcid (famotidine,  rx sent ) two times daily  For reflux     Benadryl is the most effective for POST NASAL DRIP  but it is also the most sedating,  So try taking it at night   I AM ADDING A 6 DAY TAPER OF PREDNISONE FOR THE COUGH THAT MAY BE DUE TO BRONCHITIS  Use Neil Med's Sinus Rinse to flush your sinuses in the morning   IF YOUR FOOT PAIN IMPROVES ON THE PREDNISONE, THEN YOUR PAIN IS DUE TO ARTHRITIS NOT NEUROPATHY, AND I WILL PRESCRIBE SOMETHING FOR THAT

## 2015-02-23 NOTE — Progress Notes (Signed)
Patient ID: Henry Lindsey, male   DOB: Sep 24, 1933, 81 y.o.   MRN: [id number]  Patient Active Problem List   Diagnosis Date Noted  . Acute upper respiratory infection 02/25/2015  . Peripheral neuropathy 10/09/2014  . BPH with obstruction/lower urinary tract symptoms 10/09/2014  . Noncompliance with diet and medication regimen 07/09/2014  . Bursitis of hip, right 04/05/2014  . Glaucoma 04/05/2014  . BPH (benign prostatic hyperplasia) 04/05/2014  . CKD (chronic kidney disease), stage III 12/28/2013  . Peripheral autonomic neuropathy due to DM 12/28/2013  . Overweight (BMI 25.0-29.9) 11/30/2013  . Unspecified vitamin D deficiency 11/03/2013  . Impotence due to erectile dysfunction 06/01/2013  . Routine general medical examination at a health care facility 04/04/2013  . CAD (coronary artery disease) 07/21/2012  . Peripheral vascular disease in diabetes mellitus   . Occlusion and stenosis of carotid artery with cerebral infarction   . PAD (peripheral artery disease) 07/01/2012  . Basal cell carcinoma of cheek 08/05/2011  . 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 Type 2 diabetes, uncontrolled, with peripheral circulatory disorder 08/05/2011  . Hyperlipidemia   . Hypertension   . CVA (cerebral infarction)     Subjective:  CC:   Chief Complaint  Patient presents with  . Acute Visit    Green mucus.    HPI:   Henry Lindsey is a 81 y.o. male who presents for   WORKED [place],  [place] .  NON PURULENT,  NO FEVERS ,  NO CHEST PAIN OR SINUS,  NO SHORTNESS OF BREATH.  NO SNEEZING,  NO TRAVEL .  NOT WORSE WITH SUPINE POSITION,    He has been having bilateral FOOT PAIN DESPITE NEURONITN TID.     Past Medical History  Diagnosis Date  . Diabetes mellitus   . Neuromuscular disorder   . Hyperlipidemia   . Hypertension   . CVA (cerebral infarction) feb 2012    r thalamic lacunar  . Peripheral vascular disease in diabetes mellitus Sept 2013     95% occlusion  s/p PTCA R SFA Dew Sept 2013  . Carotid artery stenosis Feb 2012    <50% bilaterally    Past Surgical History  Procedure Laterality Date  . Upper gastrointestinal endoscopy    . Joint replacement Left 2014    left knee       The following portions of the patient's history were reviewed and updated as appropriate: Allergies, current medications, and problem list.    Review of Systems:   Patient denies headache, fevers, malaise, unintentional weight loss, skin rash, eye pain, sinus congestion and sinus pain, sore throat, dysphagia,  hemoptysis , cough, dyspnea, wheezing, chest pain, palpitations, orthopnea, edema, abdominal pain, nausea, melena, diarrhea, constipation, flank pain, dysuria, hematuria, urinary  Frequency, nocturia, numbness, tingling, seizures,  Focal weakness, Loss of consciousness,  Tremor, insomnia, depression, anxiety, and suicidal ideation.     History   Social History  . Marital Status: Married    Spouse Name: Enid Derry, deceased  . Number of Children: 2  . Years of Education: 12   Occupational History  .     Social History Main Topics  . Smoking status: Former Smoker    Quit date: 04/21/1968  . Smokeless tobacco: Never Used     Comment: quit 1969  . Alcohol Use: No  . Drug Use: No  . Sexual Activity:    Partners: Female   Other Topics Concern  . Not on file  Social History Narrative   Widowed in 2014; dating again     Objective:  Filed Vitals:   02/23/15 1133  BP: 136/82  Pulse: 74  Temp: 97.6 F (36.4 C)  Resp: 14     General appearance: alert, cooperative and appears stated age Ears: normal TM's and external ear canals both ears Throat: lips, mucosa, and tongue normal; teeth and gums normal Neck: no adenopathy, no carotid bruit, supple, symmetrical, trachea midline and thyroid not enlarged, symmetric, no tenderness/mass/nodules Back: symmetric, no curvature. ROM normal. No CVA tenderness. Lungs: clear to auscultation  bilaterally Heart: regular rate and rhythm, S1, S2 normal, no murmur, click, rub or gallop Abdomen: soft, non-tender; bowel sounds normal; no masses,  no organomegaly Pulses: 2+ and symmetric Skin: Skin color, texture, turgor normal. No rashes or lesions Lymph nodes: Cervical, supraclavicular, and axillary nodes normal.  Assessment and Plan:  Acute upper respiratory infection Patient's URI is most likely viral given the mild HEENT symptoms.  I have explained that in viral URIS, an antibiotic will not help the symptoms and will increase the risk of developing C difficile enteritis.   Advised to continue oral and nasal decongestants,  Ibuprofen 400 mg and tylenol 650 mq 8 hrs for aches and pains,  And will add prednisone taper foe the cough     Updated Medication List Outpatient Encounter Prescriptions as of 02/23/2015  Medication Sig  . Alum & Mag Hydroxide-Simeth (MAGIC MOUTHWASH) SOLN Take 5 mLs by mouth 3 (three) times daily as needed for mouth pain.  [name] amLODipine (NORVASC) 10 MG tablet TAKE 1 TABLET BY MOUTH EVERY DAY  . aspirin EC 81 MG tablet Take 1 tablet (81 mg total) by mouth daily.  [name] atorvastatin (LIPITOR) 40 MG tablet TAKE 1 TABLET BY MOUTH EVERY DAY  . Blood Glucose Monitoring Suppl (ONE TOUCH ULTRA SYSTEM KIT) W/DEVICE KIT 1 kit by Does not apply route once. Use DX code E11.59  . diclofenac sodium (VOLTAREN) 1 % GEL Apply 2 g topically 4 (four) times daily.  . fenofibrate micronized (LOFIBRA) 134 MG capsule TAKE 1 CAPSULE BY MOUTH EVERY MORNING BEFORE BREAKFAST  . gabapentin (NEURONTIN) 300 MG capsule TAKE 1 CAPSULE BY MOUTH THREE TIMES DAILY  . glipiZIDE (GLUCOTROL) 10 MG tablet Take 1 tablet (10 mg total) by mouth 2 (two) times daily. (Patient taking differently: Take 5 mg by mouth 2 (two) times daily. )  . glucose blood test strip Use as instructed three times daily  . HYDROcodone-acetaminophen (NORCO/VICODIN) 5-325 MG per tablet Take 1 tablet by mouth every 6 (six) hours as  needed.  . insulin lispro protamine-lispro (HUMALOG MIX 75/25) (75-25) 100 UNIT/ML SUSP injection Inject 15 units [place] every morning and 20units every evening (Patient taking differently: Inject 10 units [place] every morning)  . INSULIN SYRINGE .5CC/29G 29G X 1/2" 0.5 ML MISC 1 Syringe by Does not apply route 3 (three) times daily.  . Insulin Syringe-Needle U-100 (INSULIN SYRINGE .5CC/31GX5/16") 31G X 5/16" 0.5 ML MISC USE THREE TIMES DAILY  . JANUVIA 50 MG tablet Take 1 tablet by mouth daily.  [name] losartan (COZAAR) 100 MG tablet TAKE 1 TABLET BY MOUTH EVERY DAY  . metFORMIN (GLUCOPHAGE) 1000 MG tablet TAKE 1 TABLET BY MOUTH TWICE DAILY  . metoprolol succinate (TOPROL-XL) 100 MG 24 hr tablet TAKE 1 TABLET BY MOUTH ONCE DAILY  . MYRBETRIQ 25 MG TB24 tablet TAKE 1 TABLET BY MOUTH DAILY  . tamsulosin (FLOMAX) 0.4 MG CAPS capsule Take 0.4 mg by mouth daily  after breakfast.  . traMADol (ULTRAM) 50 MG tablet   . famotidine (PEPCID) 20 MG tablet Take 1 tablet (20 mg total) by mouth 2 (two) times daily.  . predniSONE (DELTASONE) 10 MG tablet 6 tablets on Day 1 , then reduce by 1 tablet daily until gone   No facility-administered encounter medications on file as of 02/23/2015.     No orders of the defined types were placed in this encounter.    No Follow-up on file.

## 2015-02-23 NOTE — Assessment & Plan Note (Signed)
Recent improvement in sugars and A1c. Will update A1c at next visit.  No more interim steroids since last time.  Continue checking sugars 2 x daily.  Change 75/25 insulin to 12 units in the morning. Okay to stay off evening dose.  Continue current metformin, Glipizide, januvia.  He will continue current medications for now.    Depending on his sugars and A1c , I would like to wean him off the SU, but at last visit he really wanted to try and come off his insulin. Will assess at future visits.  Not agreeable to start GLP-1.

## 2015-02-23 NOTE — Assessment & Plan Note (Signed)
Last levels at goal on current therapy. Continue.  Update at next visit-with rest of the labs- when fasting.

## 2015-02-23 NOTE — Assessment & Plan Note (Signed)
Recent stable CKD, stage 3

## 2015-02-23 NOTE — Progress Notes (Signed)
Pre-visit discussion using our clinic review tool. No additional management support is needed unless otherwise documented below in the visit note.  

## 2015-02-23 NOTE — Progress Notes (Addendum)
Patient ID: SENG LARCH, male   DOB: 1933-04-01, 81 y.o.   MRN: [id number]   Reason for visit-  Henry Lindsey is a 81 y.o.-year-old male,  for follow up management of Type 2 diabetes, uncontrolled, with complications ( PVD, CAD, neuropathy, ED, Stage 3 CKD). Has been non compliant with recommendations by PCP. Last visit with me March 2016 .    HPI- Patient has been diagnosed with diabetes in 2005. Recalls being initially on lifestyle modifications.  Tried Metformin, Glipizide since diagnosis and continues on these medications. Also, been on insulin since around 2012. Tried regular insulin before this premixed insulin.  *tried Januvia, but had some arm numbness after starting it>>switched to tradjenta to see if symptoms improve, but then saw Dr Lucky Cowboy and finger numbness believed to be due to neck pain, so he reports going back on januvia and tolerating it well, unclear of when exactly he made the switch as he reported being on Tradjenta at last visit here with me in March 2016 *started Tradjenta 5 mg daily Nov 2015>switched to [place] 50 mg daily since last time ? * at last visit, started to have low sugars if skipped meals and reports taking 75/25 insulin at 10 units in the morning only, no evening dose around mid March 2016 * recd 2 steroid injections in rt hip and rt knee 01/17/15  Pt is currently on a regimen of: - Metformin 1000 mg po bid - Humalog 75/25  At 10 units qam  - Glipizide 5 mg twice daily>>(decreased around [facility] March 2016 due to hypoglycemia) -Januvia 50 mg daily    Last hemoglobin A1c was: A1c s in 2015 have been trending up possibly due to recent intra-articular steroid injections  Reports that home nurse did his A1c around dec 2015 and it was around 7%  Lab Results  Component Value Date   HGBA1C 7.9* 01/19/2015   HGBA1C 8.7* 08/09/2014   HGBA1C 8.8* 05/09/2014     Pt checks his sugars 2-3 times day . Uses  One Touch glucometer. By sugar log they are:  PREMEAL  Breakfast Lunch Dinner Bedtime Overall  Glucose range: 106-186 120-174 111-193 114-142   Mean/median:         Hypoglycemia-  No  Lows recently; lowest sugar was in the 89 range after missing lunch- treated with food.   ; he has some hypoglycemia awareness at 70.  Gets weakness as symptom of lows.   Dietary habits- eats three times daily again- not skipping meals specially lunch. Tries to limit carbs, sweetened beverages, sodas, desserts. Occasional desserts. Reports using dietary chart given by Dr Derrel Nip. Not interested in going to nutrition.  Exercise- every other day- leg exercises, chin up, pull ups. Limited by hip problem.  Weight - down  Wt Readings from Last 3 Encounters:  02/23/15 200 lb 8 oz (90.946 kg)  01/19/15 203 lb (92.08 kg)  10/07/14 203 lb 4 oz (92.194 kg)    Diabetes Complications-  Nephropathy- Yes Stage3  CKD, last BUN/creatinine-  Lab Results  Component Value Date   BUN 38* 01/19/2015   CREATININE 1.38 01/19/2015   Lab Results  Component Value Date   GFR 52.73* 01/19/2015   Lab Results  Component Value Date   MICRALBCREAT 5.2 05/09/2014    Retinopathy- No, Last DEE was 6 months ago, had another appt April 2016-new eye glasses Neuropathy- Has numbness and tingling in his feet. Known neuropathy. Being Treated with gabapentin and topical cream. Some days are better than  others for symptom control  Associated history - Has CAD . Prior mini stroke. No hypothyroidism. his last TSH was  Lab Results  Component Value Date   TSH 1.34 07/29/2013    Hyperlipidemia-  his last set of lipids were- Currently on lipitor 40 mg daily and fenofibrate 134 mg. Tolerating well.  fasting today.  Lab Results  Component Value Date   CHOL 123 05/09/2014   HDL 42.00 05/09/2014   LDLCALC 60 05/09/2014   LDLDIRECT 67.8 02/15/2013   TRIG 104.0 05/09/2014   CHOLHDL 3 05/09/2014    Blood Pressure/HTN- Patient's blood pressure is well controlled today on current regimen that  includes ARB.   I have reviewed the patient's past medical history, medications and allergies.   Current Outpatient Prescriptions on File Prior to Visit  Medication Sig Dispense Refill  . amLODipine (NORVASC) 10 MG tablet TAKE 1 TABLET BY MOUTH EVERY DAY 30 tablet 6  . aspirin EC 81 MG tablet Take 1 tablet (81 mg total) by mouth daily. 90 tablet 3  . atorvastatin (LIPITOR) 40 MG tablet TAKE 1 TABLET BY MOUTH EVERY DAY 90 tablet 1  . Blood Glucose Monitoring Suppl (ONE TOUCH ULTRA SYSTEM KIT) W/DEVICE KIT 1 kit by Does not apply route once. Use DX code E11.59 1 each 0  . diclofenac sodium (VOLTAREN) 1 % GEL Apply 2 g topically 4 (four) times daily. 100 g 1  . fenofibrate micronized (LOFIBRA) 134 MG capsule TAKE 1 CAPSULE BY MOUTH EVERY MORNING BEFORE BREAKFAST 30 capsule 5  . gabapentin (NEURONTIN) 300 MG capsule TAKE 1 CAPSULE BY MOUTH THREE TIMES DAILY 90 capsule 5  . glipiZIDE (GLUCOTROL) 10 MG tablet Take 1 tablet (10 mg total) by mouth 2 (two) times daily. (Patient taking differently: Take 5 mg by mouth 2 (two) times daily. ) 60 tablet 6  . glucose blood test strip Use as instructed three times daily 100 each 12  . HYDROcodone-acetaminophen (NORCO/VICODIN) 5-325 MG per tablet Take 1 tablet by mouth every 6 (six) hours as needed. 90 tablet 0  . insulin lispro protamine-lispro (HUMALOG MIX 75/25) (75-25) 100 UNIT/ML SUSP injection Inject 15 units [place] every morning and 20units every evening (Patient taking differently: Inject 10 units [place] every morning) 10 mL 0  . INSULIN SYRINGE .5CC/29G 29G X 1/2" 0.5 ML MISC 1 Syringe by Does not apply route 3 (three) times daily. 100 each 6  . Insulin Syringe-Needle U-100 (INSULIN SYRINGE .5CC/31GX5/16") 31G X 5/16" 0.5 ML MISC USE THREE TIMES DAILY 100 each 5  . losartan (COZAAR) 100 MG tablet TAKE 1 TABLET BY MOUTH EVERY DAY 30 tablet 5  . metFORMIN (GLUCOPHAGE) 1000 MG tablet TAKE 1 TABLET BY MOUTH TWICE DAILY 180 tablet 0  . metoprolol succinate  (TOPROL-XL) 100 MG 24 hr tablet TAKE 1 TABLET BY MOUTH ONCE DAILY 30 tablet 5  . MYRBETRIQ 25 MG TB24 tablet TAKE 1 TABLET BY MOUTH DAILY 30 tablet 5  . tamsulosin (FLOMAX) 0.4 MG CAPS capsule Take 0.4 mg by mouth daily after breakfast.    . traMADol (ULTRAM) 50 MG tablet      No current facility-administered medications on file prior to visit.   No Known Allergies Family History  Problem Relation Age of Onset  . Diabetes Mother     Review of Systems- [ x ]  Complains of    [  ]  denies [  ] Recent weight change [  ]  Fatigue [  ] polydipsia [  ] polyuria [  ]  nocturia [  ]  vision difficulty [  ] chest pain [  ] shortness of breath [  ] leg swelling [ x ] cough-yellow stuff x 3 weeks- wants to get Z pack filled, as prior hx PNA. No fever/sob [  ] nausea/vomiting [  ] diarrhea [  ] constipation [  ] abdominal pain [ x ]  tingling/numbness in extremities [  ]  concern with feet ( wounds/sores)   PE: BP 136/82 mmHg  Pulse 74  Resp 14  Ht '5\' 10"'  (1.778 m)  Wt 200 lb 8 oz (90.946 kg)  BMI 28.77 kg/m2  SpO2 97% Wt Readings from Last 3 Encounters:  02/23/15 200 lb 8 oz (90.946 kg)  01/19/15 203 lb (92.08 kg)  10/07/14 203 lb 4 oz (92.194 kg)   Exam: deferred  ASSESSMENT AND PLAN:  Problem List Items Addressed This Visit      Cardiovascular and Mediastinum   Hypertension    BP at target today. Last urine MA negative 04/2014. Is on losartan.           Type 2 diabetes, uncontrolled, with peripheral circulatory disorder    Recent improvement in sugars and A1c. Will update A1c at next visit.  No more interim steroids since last time.  Continue checking sugars 2 x daily.  Change 75/25 insulin to 12 units in the morning. Okay to stay off evening dose.  Continue current metformin, Glipizide, januvia.  He will continue current medications for now.    Depending on his sugars and A1c , I would like to wean him off the SU, but at last visit he really wanted to try and come off  his insulin. Will assess at future visits.  Not agreeable to start GLP-1.            Relevant Medications   JANUVIA 50 MG tablet     Genitourinary   CKD (chronic kidney disease), stage III    Recent stable CKD, stage 3           Other   Hyperlipidemia - Primary     Last levels at goal on current therapy. Continue.  Update at next visit-with rest of the labs- when fasting.             - Return to clinic in 63mowith sugar log/meter. Patient agreeable to follow up with PCP today for evaluation of cough and to discuss about his medication- Myrbetriq  PL[facility] CenterPV[facility]5/02/2015 10:47 AM

## 2015-02-23 NOTE — Patient Instructions (Addendum)
Increase morning time insulin to 12 units in the morning. Ok to stay off evening time insulin.  Continue current januvia, glipizide and metformin.   Please come back for a follow-up appointment in 2 months

## 2015-02-25 ENCOUNTER — Encounter: Payer: Self-pay | Admitting: Internal Medicine

## 2015-02-25 DIAGNOSIS — M25579 Pain in unspecified ankle and joints of unspecified foot: Secondary | ICD-10-CM | POA: Insufficient documentation

## 2015-02-25 DIAGNOSIS — J069 Acute upper respiratory infection, unspecified: Secondary | ICD-10-CM | POA: Insufficient documentation

## 2015-02-25 NOTE — Assessment & Plan Note (Addendum)
Patient's URI is most likely viral given the mild HEENT symptoms.  I have explained that in viral URIS, an antibiotic will not help the symptoms and will increase the risk of developing C difficile enteritis.   Advised to continue oral and nasal decongestants,  Ibuprofen 400 mg and tylenol 650 mq 8 hrs for aches and pains,  And will add prednisone taper foe the cough

## 2015-03-16 ENCOUNTER — Ambulatory Visit (INDEPENDENT_AMBULATORY_CARE_PROVIDER_SITE_OTHER): Payer: Medicare Other | Admitting: Nurse Practitioner

## 2015-03-16 VITALS — BP 138/64 | HR 84 | Temp 98.0°F | Resp 18 | Ht 70.0 in | Wt 194.0 lb

## 2015-03-16 DIAGNOSIS — J069 Acute upper respiratory infection, unspecified: Secondary | ICD-10-CM

## 2015-03-16 MED ORDER — AZITHROMYCIN 250 MG PO TABS: ORAL_TABLET | ORAL | Status: DC

## 2015-03-16 NOTE — Progress Notes (Signed)
   Subjective:    Patient ID: Henry Lindsey, male    DOB: 1933-01-26, 81 y.o.   MRN: [id number]  HPI  Henry Lindsey is a 81 yo male with a CC of chest congestion x 6 weeks.   1) Light green mucous, cough, 6 weeks, no improvement. Worsening again after last round of prednisone.  Prednisone and antihistamines     Review of Systems  Constitutional: Negative for fever, chills, diaphoresis and fatigue.  HENT: Positive for postnasal drip and rhinorrhea. Negative for congestion, ear discharge, ear pain, sinus pressure, sneezing, sore throat and tinnitus.   Eyes: Negative for visual disturbance.  Respiratory: Positive for cough. Negative for chest tightness, shortness of breath and wheezing.   Cardiovascular: Negative for chest pain, palpitations and leg swelling.  Gastrointestinal: Negative for nausea, vomiting and diarrhea.  Skin: Negative for rash.      Objective:   Physical Exam  Constitutional: He is oriented to person, place, and time. He appears well-developed and well-nourished. No distress.  BP 138/64 mmHg  Pulse 84  Temp(Src) 98 F (36.7 C)  Resp 18  Ht 5\' 10"  (1.778 Henry)  Wt 194 lb (87.998 kg)  BMI 27.84 kg/m2  SpO2 95%   HENT:  Head: Normocephalic and atraumatic.  Right Ear: External ear normal.  Left Ear: External ear normal.  Mouth/Throat: Oropharynx is clear and moist.  Eyes: EOM are normal. Pupils are equal, round, and reactive to light. Right eye exhibits no discharge. Left eye exhibits no discharge. No scleral icterus.  Neck: Normal range of motion. Neck supple. No thyromegaly present.  Cardiovascular: Normal rate, regular rhythm and normal heart sounds.  Exam reveals no gallop and no friction rub.   No murmur heard. Pulmonary/Chest: Effort normal and breath sounds normal. No respiratory distress. He has no wheezes. He has no rales. He exhibits no tenderness.  Lymphadenopathy:    He has no cervical adenopathy.  Neurological: He is alert and oriented to person,  place, and time.  Skin: Skin is warm and dry. No rash noted. He is not diaphoretic.  Psychiatric: He has a normal mood and affect. His behavior is normal. Judgment and thought content normal.        Assessment & Plan:

## 2015-03-16 NOTE — Patient Instructions (Signed)
Take the Z-pack as directed.   Please take a probiotic ( Align, [place]ora[place] or Culturelle) while you are on the antibiotic to prevent a serious antibiotic associated diarrhea  Called clostirudium dificile colitis.

## 2015-03-16 NOTE — Progress Notes (Signed)
Pre visit review using our clinic review tool, if applicable. No additional management support is needed unless otherwise documented below in the visit note. 

## 2015-03-21 ENCOUNTER — Other Ambulatory Visit: Payer: Self-pay | Admitting: Internal Medicine

## 2015-03-21 ENCOUNTER — Other Ambulatory Visit: Payer: Self-pay | Admitting: *Deleted

## 2015-03-21 MED ORDER — JANUVIA 50 MG PO TABS: 50.0000 mg | ORAL_TABLET | Freq: Every day | ORAL | Status: DC

## 2015-03-30 ENCOUNTER — Telehealth: Payer: Self-pay

## 2015-03-30 NOTE — Telephone Encounter (Signed)
Called and offered patient an appointment with NP patient declined appointment.

## 2015-03-30 NOTE — Telephone Encounter (Signed)
The pt called and stated he is still having congestion (he was seen on 5/26 and prescribed a z-pack).  He is hoping to get advice on what to do due to his congestion being less, but not completely gone.  Callback - [phone]

## 2015-04-03 ENCOUNTER — Encounter: Payer: Self-pay | Admitting: Nurse Practitioner

## 2015-04-03 NOTE — Assessment & Plan Note (Addendum)
6 weeks no improvement. Will try Z-pack, encouraged probiotics, continue prn OTC medications. FU prn worsening/failure to improve.

## 2015-04-06 ENCOUNTER — Other Ambulatory Visit: Payer: Self-pay | Admitting: *Deleted

## 2015-04-06 ENCOUNTER — Telehealth: Payer: Self-pay | Admitting: *Deleted

## 2015-04-06 ENCOUNTER — Encounter: Payer: Self-pay | Admitting: Endocrinology

## 2015-04-06 ENCOUNTER — Ambulatory Visit (INDEPENDENT_AMBULATORY_CARE_PROVIDER_SITE_OTHER): Payer: Medicare Other | Admitting: Endocrinology

## 2015-04-06 ENCOUNTER — Other Ambulatory Visit: Payer: Self-pay | Admitting: Internal Medicine

## 2015-04-06 VITALS — BP 126/62 | HR 72 | Resp 12 | Ht 70.0 in | Wt 196.8 lb

## 2015-04-06 DIAGNOSIS — E1165 Type 2 diabetes mellitus with hyperglycemia: Principal | ICD-10-CM

## 2015-04-06 DIAGNOSIS — G629 Polyneuropathy, unspecified: Secondary | ICD-10-CM | POA: Diagnosis not present

## 2015-04-06 DIAGNOSIS — I10 Essential (primary) hypertension: Secondary | ICD-10-CM

## 2015-04-06 DIAGNOSIS — E1159 Type 2 diabetes mellitus with other circulatory complications: Secondary | ICD-10-CM | POA: Diagnosis not present

## 2015-04-06 DIAGNOSIS — IMO0002 Reserved for concepts with insufficient information to code with codable children: Secondary | ICD-10-CM

## 2015-04-06 DIAGNOSIS — E1151 Type 2 diabetes mellitus with diabetic peripheral angiopathy without gangrene: Secondary | ICD-10-CM

## 2015-04-06 MED ORDER — HYDROCODONE-ACETAMINOPHEN 5-325 MG PO TABS: 1.0000 | ORAL_TABLET | Freq: Four times a day (QID) | ORAL | Status: DC | PRN

## 2015-04-06 MED ORDER — GABAPENTIN 400 MG PO CAPS: 400.0000 mg | ORAL_CAPSULE | Freq: Three times a day (TID) | ORAL | Status: DC

## 2015-04-06 NOTE — Telephone Encounter (Signed)
Pt has refills remaining at the pharmacy for Gabapentin.  Please advise refills on Hydrocodone, last OV 5.5.16.

## 2015-04-06 NOTE — Telephone Encounter (Signed)
Pt wants new rx for gabapentin 400mg  and also hydrocodone rx refill for July aug and sept. [name]

## 2015-04-06 NOTE — Telephone Encounter (Signed)
Patient left Message as to refill on Hydrocodone and would like increase in gabapentin to 400 mg patient has been using Aspercreme on feet for pain. Pend July refill on hydrocodone.

## 2015-04-06 NOTE — Patient Instructions (Signed)
Continue current medications. Check sugars atleast once daily ( please rotate times of the day when you are checking).  Record them in a log book and bring that/meter to next appointment.   A1c next month.  Schedule appt for early July 2016 with Dr Derrel Nip

## 2015-04-06 NOTE — Progress Notes (Signed)
Pre visit review using our clinic review tool, if applicable. No additional management support is needed unless otherwise documented below in the visit note. 

## 2015-04-06 NOTE — Telephone Encounter (Signed)
done

## 2015-04-06 NOTE — Progress Notes (Signed)
Reason for visit-  Henry Lindsey is a 81 y.o.-year-old male,  for follow up management of Type 2 diabetes, uncontrolled, with complications ( PVD, CAD, neuropathy, ED, Stage 3 CKD). Has been non compliant with recommendations by PCP. Last visit with me May 2016 .    HPI- Patient has been diagnosed with diabetes in 2005. Recalls being initially on lifestyle modifications.  Tried Metformin, Glipizide since diagnosis and continues on these medications. Also, been on insulin since around 2012. Tried regular insulin before this premixed insulin.  *tried Januvia, but had some arm numbness after starting it>>switched to tradjenta to see if symptoms improve, but then saw Dr Lucky Cowboy and finger numbness believed to be due to neck pain, so he reports going back on januvia and tolerating it well, unclear of when exactly he made the switch as he reported being on Tradjenta at last visit here with me in March 2016 *started Tradjenta 5 mg daily Nov 2015>switched to [place] 50 mg daily since last time ? * at last visit, started to have low sugars if skipped meals and reports taking 75/25 insulin at 10 units in the morning only, no evening dose around mid March 2016 * recd 2 steroid injections in rt hip and rt knee 01/17/15  Pt is currently on a regimen of: - Metformin 1000 mg po bid - Humalog 75/25  At 12 units qam  - Glipizide 5 mg twice daily>>(decreased around [facility] March 2016 due to hypoglycemia) -Januvia 50 mg daily    Last hemoglobin A1c was: A1c s in 2015 have been trending up possibly due to recent intra-articular steroid injections  Reports that home nurse did his A1c around dec 2015 and it was around 7%  Lab Results  Component Value Date   HGBA1C 7.9* 01/19/2015   HGBA1C 8.7* 08/09/2014   HGBA1C 8.8* 05/09/2014     Pt checks his sugars 2-3 times day . Uses  One Touch glucometer. By  Recall they are:  PREMEAL Breakfast Lunch Dinner Bedtime Overall  Glucose range:     167 yday morning- <200   Mean/median:         Hypoglycemia-  No  Lows recently; lowest sugar was in the n/a.   ; he has some hypoglycemia awareness at 70.  Gets weakness as symptom of lows.   Dietary habits- eats three times daily again- not skipping meals specially lunch. Tries to limit carbs, sweetened beverages, sodas, desserts. Occasional desserts. Reports using dietary chart given by Dr Derrel Nip. Not interested in going to nutrition.  Exercise- every other day- leg exercises, chin up, pull ups. Limited by hip problem.  Weight -  Wt Readings from Last 3 Encounters:  04/06/15 196 lb 12 oz (89.245 kg)  03/16/15 194 lb (87.998 kg)  02/23/15 200 lb 8 oz (90.946 kg)    Diabetes Complications-  Nephropathy- Yes Stage3  CKD, last BUN/creatinine-  Lab Results  Component Value Date   BUN 38* 01/19/2015   CREATININE 1.38 01/19/2015   Lab Results  Component Value Date   GFR 52.73* 01/19/2015   Lab Results  Component Value Date   MICRALBCREAT 5.2 05/09/2014    Retinopathy- No, Last DEE was 6 months ago, had another appt April 2016-new eye glasses Neuropathy- Has numbness and tingling in his feet. Known neuropathy. Being Treated with gabapentin and topical cream. Some days are better than others for symptom control >>request a dose increase for better symptom control Associated history - Has CAD . Prior mini stroke. No hypothyroidism.  his last TSH was  Lab Results  Component Value Date   TSH 1.34 07/29/2013    Hyperlipidemia-  his last set of lipids were- Currently on lipitor 40 mg daily and fenofibrate 134 mg. Tolerating well.   Lab Results  Component Value Date   CHOL 123 05/09/2014   HDL 42.00 05/09/2014   LDLCALC 60 05/09/2014   LDLDIRECT 67.8 02/15/2013   TRIG 104.0 05/09/2014   CHOLHDL 3 05/09/2014    Blood Pressure/HTN- Patient's blood pressure is well controlled today on current regimen that includes ARB.   I have reviewed the patient's past medical history, medications and allergies.    Current Outpatient Prescriptions on File Prior to Visit  Medication Sig Dispense Refill  . Alum & Mag Hydroxide-Simeth (MAGIC MOUTHWASH) SOLN Take 5 mLs by mouth 3 (three) times daily as needed for mouth pain.    [name] amLODipine (NORVASC) 10 MG tablet TAKE 1 TABLET BY MOUTH EVERY DAY 30 tablet 6  . aspirin EC 81 MG tablet Take 1 tablet (81 mg total) by mouth daily. 90 tablet 3  . atorvastatin (LIPITOR) 40 MG tablet TAKE 1 TABLET BY MOUTH EVERY DAY 90 tablet 1  . Blood Glucose Monitoring Suppl (ONE TOUCH ULTRA SYSTEM KIT) W/DEVICE KIT 1 kit by Does not apply route once. Use DX code E11.59 1 each 0  . diclofenac sodium (VOLTAREN) 1 % GEL Apply 2 g topically 4 (four) times daily. 100 g 1  . famotidine (PEPCID) 20 MG tablet Take 1 tablet (20 mg total) by mouth 2 (two) times daily. 60 tablet 2  . fenofibrate micronized (LOFIBRA) 134 MG capsule TAKE 1 CAPSULE BY MOUTH EVERY MORNING BEFORE BREAKFAST 30 capsule 5  . glipiZIDE (GLUCOTROL) 10 MG tablet Take 1 tablet (10 mg total) by mouth 2 (two) times daily. (Patient taking differently: Take 5 mg by mouth 2 (two) times daily. ) 60 tablet 6  . glucose blood test strip Use as instructed three times daily 100 each 12  . insulin lispro protamine-lispro (HUMALOG MIX 75/25) (75-25) 100 UNIT/ML SUSP injection Inject 15 units [place] every morning and 20units every evening (Patient taking differently: Inject 12 units [place] every morning) 10 mL 0  . INSULIN SYRINGE .5CC/29G 29G X 1/2" 0.5 ML MISC 1 Syringe by Does not apply route 3 (three) times daily. 100 each 6  . Insulin Syringe-Needle U-100 (INSULIN SYRINGE .5CC/31GX5/16") 31G X 5/16" 0.5 ML MISC USE THREE TIMES DAILY 100 each 5  . JANUVIA 50 MG tablet Take 1 tablet (50 mg total) by mouth daily. 30 tablet 5  . losartan (COZAAR) 100 MG tablet TAKE 1 TABLET BY MOUTH EVERY DAY 30 tablet 0  . metFORMIN (GLUCOPHAGE) 1000 MG tablet TAKE 1 TABLET BY MOUTH TWICE DAILY 180 tablet 0  . metoprolol succinate (TOPROL-XL) 100 MG 24  hr tablet TAKE 1 TABLET BY MOUTH ONCE DAILY 30 tablet 5  . tamsulosin (FLOMAX) 0.4 MG CAPS capsule Take 0.4 mg by mouth daily after breakfast.    . traMADol (ULTRAM) 50 MG tablet      No current facility-administered medications on file prior to visit.   No Known Allergies Family History  Problem Relation Age of Onset  . Diabetes Mother     Review of Systems- [ x ]  Complains of    [  ]  denies [  ] Recent weight change [ x ]  Fatigue [  ] polydipsia [  ] polyuria [ x ]  nocturia [  ]  vision difficulty [  ] chest pain [  ] shortness of breath [ x ] leg swelling>mild [ x ] cough>almost gone after recent Z pack [  ] nausea/vomiting [  ] diarrhea [ x ] constipation [  ] abdominal pain [ x ]  tingling/numbness in extremities [x  ]  concern with feet ( wounds/sores)>>feels that neuropathy symptoms will be better controlled on higher dose of neurontin, requests Dr Derrel Nip to increase med    PE: BP 126/62 mmHg  Pulse 72  Resp 12  Ht 5' 10" (1.778 m)  Wt 196 lb 12 oz (89.245 kg)  BMI 28.23 kg/m2  SpO2 97% Wt Readings from Last 3 Encounters:  04/06/15 196 lb 12 oz (89.245 kg)  03/16/15 194 lb (87.998 kg)  02/23/15 200 lb 8 oz (90.946 kg)   Exam: deferred  ASSESSMENT AND PLAN:  Problem List Items Addressed This Visit      Cardiovascular and Mediastinum   Hypertension - Primary    BP at target today. Last urine MA negative 04/2014. Is on losartan.             Type 2 diabetes, uncontrolled, with peripheral circulatory disorder    Recent improvement in sugars and A1c. Will update A1c at next visit.  No more interim steroids since last time.  Encouraged him to check sugars atleast 1 x daily.  Continue current metformin, Glipizide, januvia and 75/25 insulin in the morning- He was advised to bring log to all future appointments.   Depending on his sugars and A1c , I would like to wean him off the SU, but at last visits he really wanted to try and come off his insulin. Will  assess at future visits.  Not agreeable to start GLP-1.                Nervous and Auditory   Peripheral neuropathy    Regular self foot care. Is on neurontin and symptoms are not well controlled. Message sent to Dr Derrel Nip regarding dose increase in neurontin from 300 mg TID to 400 mg TID            - Return to clinic in 4 weeks with sugar log/meter. Patient agreeable to follow up with PCP for DM care, as I am transferring out of [place] 04/10/2015 10:26 AM

## 2015-04-06 NOTE — Telephone Encounter (Signed)
Rx printed, pt aware ready for pick up

## 2015-04-06 NOTE — Telephone Encounter (Signed)
A user error has taken place.

## 2015-04-10 NOTE — Assessment & Plan Note (Signed)
Regular self foot care. Is on neurontin and symptoms are not well controlled. Message sent to Dr Derrel Nip regarding dose increase in neurontin from 300 mg TID to 400 mg TID

## 2015-04-10 NOTE — Assessment & Plan Note (Signed)
BP at target today. Last urine MA negative 04/2014. Is on losartan.

## 2015-04-10 NOTE — Assessment & Plan Note (Addendum)
Recent improvement in sugars and A1c. Will update A1c at next visit.  No more interim steroids since last time.  Encouraged him to check sugars atleast 1 x daily.  Continue current metformin, Glipizide, januvia and 75/25 insulin in the morning- He was advised to bring log to all future appointments.   Depending on his sugars and A1c , I would like to wean him off the SU, but at last visits he really wanted to try and come off his insulin. Will assess at future visits.  Not agreeable to start GLP-1.

## 2015-04-15 ENCOUNTER — Other Ambulatory Visit: Payer: Self-pay | Admitting: Internal Medicine

## 2015-04-25 ENCOUNTER — Ambulatory Visit: Payer: Medicare Other | Admitting: Endocrinology

## 2015-05-04 ENCOUNTER — Encounter: Payer: Self-pay | Admitting: Internal Medicine

## 2015-05-04 ENCOUNTER — Ambulatory Visit (INDEPENDENT_AMBULATORY_CARE_PROVIDER_SITE_OTHER): Payer: Medicare Other | Admitting: Internal Medicine

## 2015-05-04 ENCOUNTER — Other Ambulatory Visit (INDEPENDENT_AMBULATORY_CARE_PROVIDER_SITE_OTHER): Payer: Medicare Other

## 2015-05-04 VITALS — BP 136/66 | HR 86 | Temp 97.8°F | Resp 14 | Ht 70.0 in | Wt 196.5 lb

## 2015-05-04 DIAGNOSIS — IMO0002 Reserved for concepts with insufficient information to code with codable children: Secondary | ICD-10-CM

## 2015-05-04 DIAGNOSIS — E785 Hyperlipidemia, unspecified: Secondary | ICD-10-CM

## 2015-05-04 DIAGNOSIS — E0841 Diabetes mellitus due to underlying condition with diabetic mononeuropathy: Secondary | ICD-10-CM

## 2015-05-04 DIAGNOSIS — N401 Enlarged prostate with lower urinary tract symptoms: Secondary | ICD-10-CM

## 2015-05-04 DIAGNOSIS — E1159 Type 2 diabetes mellitus with other circulatory complications: Secondary | ICD-10-CM

## 2015-05-04 DIAGNOSIS — Z79899 Other long term (current) drug therapy: Secondary | ICD-10-CM | POA: Diagnosis not present

## 2015-05-04 DIAGNOSIS — E1151 Type 2 diabetes mellitus with diabetic peripheral angiopathy without gangrene: Secondary | ICD-10-CM

## 2015-05-04 DIAGNOSIS — N183 Chronic kidney disease, stage 3 (moderate): Secondary | ICD-10-CM

## 2015-05-04 DIAGNOSIS — E1165 Type 2 diabetes mellitus with hyperglycemia: Secondary | ICD-10-CM

## 2015-05-04 DIAGNOSIS — G629 Polyneuropathy, unspecified: Secondary | ICD-10-CM

## 2015-05-04 DIAGNOSIS — N138 Other obstructive and reflux uropathy: Secondary | ICD-10-CM

## 2015-05-04 LAB — LIPID PANEL
Cholesterol: 135 mg/dL (ref 0–200)
HDL: 34.5 mg/dL — ABNORMAL LOW (ref >39.00)
LDL Cholesterol: 70 mg/dL (ref 0–99)
NONHDL: 100.5
Total CHOL/HDL Ratio: 4
Triglycerides: 155 mg/dL — ABNORMAL HIGH (ref 0.0–149.0)
VLDL: 31 mg/dL (ref 0.0–40.0)

## 2015-05-04 LAB — COMPREHENSIVE METABOLIC PANEL
ALK PHOS: 41 U/L (ref 39–117)
ALT: 22 U/L (ref 0–53)
AST: 21 U/L (ref 0–37)
Albumin: 4.3 g/dL (ref 3.5–5.2)
BUN: 21 mg/dL (ref 6–23)
CHLORIDE: 100 meq/L (ref 96–112)
CO2: 30 mEq/L (ref 19–32)
CREATININE: 1.53 mg/dL — AB (ref 0.40–1.50)
Calcium: 9.8 mg/dL (ref 8.4–10.5)
GFR: 46.78 mL/min — AB (ref >60.00)
Glucose, Bld: 184 mg/dL — ABNORMAL HIGH (ref 70–99)
POTASSIUM: 4.7 meq/L (ref 3.5–5.1)
Sodium: 139 mEq/L (ref 135–145)
Total Bilirubin: 0.5 mg/dL (ref 0.2–1.2)
Total Protein: 7.3 g/dL (ref 6.0–8.3)

## 2015-05-04 LAB — HEMOGLOBIN A1C: HEMOGLOBIN A1C: 7.7 % — AB (ref 4.6–6.5)

## 2015-05-04 LAB — VITAMIN B12: Vitamin B-12: 788 pg/mL (ref 211–911)

## 2015-05-04 LAB — TSH: TSH: 2.5 u[IU]/mL (ref 0.35–4.50)

## 2015-05-04 MED ORDER — GABAPENTIN 300 MG PO CAPS: 600.0000 mg | ORAL_CAPSULE | Freq: Three times a day (TID) | ORAL | Status: DC

## 2015-05-04 MED ORDER — FINASTERIDE 5 MG PO TABS: 5.0000 mg | ORAL_TABLET | Freq: Every day | ORAL | Status: DC

## 2015-05-04 NOTE — Patient Instructions (Addendum)
I a increasing your gabapentin dose to 600 mg three times daily  Your pills are 300 mg so take 2 every 8 hours,    I am adding finasteride (Proscar) for your prostate problems that are making it hard to void     Stay on the januvia, the glipizide and the metformin for your diabetes  At your current doses   If yo have a dinner that has pasta, bread or or potatoes,  Or DESSERT other than fruit,  You should take a full glipizide tablet   Please return in 2 weeks for a blood test .  Make sure you are hydrated (drink water)

## 2015-05-04 NOTE — Progress Notes (Signed)
Subjective:  Patient ID: Henry Lindsey, male    DOB: May 09, 1933  Age: 81 y.o. MRN: [id number]  CC: The primary encounter diagnosis was CKD (chronic kidney disease), stage 3 (moderate). Diagnoses of Type 2 diabetes, uncontrolled, with peripheral circulatory disorder, Hyperlipidemia, BPH with obstruction/lower urinary tract symptoms, Peripheral vascular disease in diabetes mellitus, and Diabetic mononeuropathy associated with diabetes mellitus due to underlying condition were also pertinent to this visit.  HPI Henry Lindsey presents for diabetes follow up . He is no longer on insulin , sicne Dr Howell Rucks started him on Januvia,  His current regimen is taking glipizide 5 mg twice daily  Metformin 1/2 tablet twice daily  and januvia 50 mg daily    Had annual checkup with dr Lucky Cowboy ,  No progression of disease incolcing th  Carotids, aBIs done.  Follow up 6 months.    fastings  range from 116 to 173  Post prandials mostly < 160  GFR discussed needs repeating  Foot exam some loss of sensation cue to callouses   Nocturia still a problem;  some nights hourly trips to the bathroom. Henry Lindsey Has has urology follow up and prior trial of Myrbetriq caused  Angioedema. Taking flomax but thinks it has has had no effect. .  Sees dr cCpe but has not seen him since  May 2015, when he was started on Flomax.   Outpatient Prescriptions Prior to Visit  Medication Sig Dispense Refill  . Alum & Mag Hydroxide-Simeth (MAGIC MOUTHWASH) SOLN Take 5 mLs by mouth 3 (three) times daily as needed for mouth pain.    Henry Lindsey amLODipine (NORVASC) 10 MG tablet TAKE 1 TABLET BY MOUTH EVERY DAY 30 tablet 6  . aspirin EC 81 MG tablet Take 1 tablet (81 mg total) by mouth daily. 90 tablet 3  . atorvastatin (LIPITOR) 40 MG tablet TAKE 1 TABLET BY MOUTH EVERY DAY 90 tablet 1  . Blood Glucose Monitoring Suppl (ONE TOUCH ULTRA SYSTEM KIT) W/DEVICE KIT 1 kit by Does not apply route once. Use DX code E11.59 1 each 0  . diclofenac sodium (VOLTAREN) 1 %  GEL Apply 2 g topically 4 (four) times daily. 100 g 1  . famotidine (PEPCID) 20 MG tablet Take 1 tablet (20 mg total) by mouth 2 (two) times daily. 60 tablet 2  . fenofibrate micronized (LOFIBRA) 134 MG capsule TAKE 1 CAPSULE BY MOUTH EVERY MORNING BEFORE BREAKFAST 30 capsule 5  . glipiZIDE (GLUCOTROL) 10 MG tablet Take 1 tablet (10 mg total) by mouth 2 (two) times daily. (Patient taking differently: Take 5 mg by mouth 2 (two) times daily. ) 60 tablet 6  . glucose blood test strip Use as instructed three times daily 100 each 12  . HYDROcodone-acetaminophen (NORCO/VICODIN) 5-325 MG per tablet Take 1 tablet by mouth every 6 (six) hours as needed. 90 tablet 0  . JANUVIA 50 MG tablet Take 1 tablet (50 mg total) by mouth daily. 30 tablet 5  . losartan (COZAAR) 100 MG tablet TAKE 1 TABLET BY MOUTH EVERY DAY 30 tablet 5  . metFORMIN (GLUCOPHAGE) 1000 MG tablet TAKE 1 TABLET BY MOUTH TWICE DAILY 180 tablet 0  . metoprolol succinate (TOPROL-XL) 100 MG 24 hr tablet TAKE 1 TABLET BY MOUTH ONCE DAILY 30 tablet 5  . tamsulosin (FLOMAX) 0.4 MG CAPS capsule Take 0.4 mg by mouth daily after breakfast.    . traMADol (ULTRAM) 50 MG tablet     . gabapentin (NEURONTIN) 400 MG capsule Take 1 capsule (400  mg total) by mouth 3 (three) times daily. 90 capsule 5  . insulin lispro protamine-lispro (HUMALOG MIX 75/25) (75-25) 100 UNIT/ML SUSP injection Inject 15 units [place] every morning and 20units every evening (Patient not taking: Reported on 05/04/2015) 10 mL 0  . INSULIN SYRINGE .5CC/29G 29G X 1/2" 0.5 ML MISC 1 Syringe by Does not apply route 3 (three) times daily. (Patient not taking: Reported on 05/04/2015) 100 each 6  . Insulin Syringe-Needle U-100 (INSULIN SYRINGE .5CC/31GX5/16") 31G X 5/16" 0.5 ML MISC USE THREE TIMES DAILY (Patient not taking: Reported on 05/04/2015) 100 each 5   No facility-administered medications prior to visit.    Review of Systems;  Patient denies headache, fevers, malaise, unintentional  weight loss, skin rash, eye pain, sinus congestion and sinus pain, sore throat, dysphagia,  hemoptysis , cough, dyspnea, wheezing, chest pain, palpitations, orthopnea, edema, abdominal pain, nausea, melena, diarrhea, constipation, flank pain, dysuria, hematuria, urinary  Frequency, nocturia, numbness, tingling, seizures,  Focal weakness, Loss of consciousness,  Tremor, insomnia, depression, anxiety, and suicidal ideation.      Objective:  BP 136/66 mmHg  Pulse 86  Temp(Src) 97.8 F (36.6 C) (Oral)  Resp 14  Ht _0  (1.778 m)  Wt 196 lb 8 oz (89.132 kg)  BMI 28.19 kg/m2  SpO2 95%  BP Readings from Last 3 Encounters:  05/04/15 136/66  04/06/15 126/62  03/16/15 138/64    Wt Readings from Last 3 Encounters:  05/04/15 196 lb 8 oz (89.132 kg)  04/06/15 196 lb 12 oz (89.245 kg)  03/16/15 194 lb (87.998 kg)    General appearance: alert, cooperative and appears stated age Ears: normal TM's and external ear canals both ears Throat: lips, mucosa, and tongue normal; teeth and gums normal Neck: no adenopathy, no carotid bruit, supple, symmetrical, trachea midline and thyroid not enlarged, symmetric, no tenderness/mass/nodules Back: symmetric, no curvature. ROM normal. No CVA tenderness. Lungs: clear to auscultation bilaterally Heart: regular rate and rhythm, S1, S2 normal, no murmur, click, rub or gallop Abdomen: soft, non-tender; bowel sounds normal; no masses,  no organomegaly Pulses: 2+ and symmetric Skin: Skin color, texture, turgor normal. No rashes or lesions Lymph nodes: Cervical, supraclavicular, and axillary nodes normal.  Lab Results  Component Value Date   HGBA1C 7.7* 05/04/2015   HGBA1C 7.9* 01/19/2015   HGBA1C 8.7* 08/09/2014    Lab Results  Component Value Date   CREATININE 1.53* 05/04/2015   CREATININE 1.38 01/19/2015   CREATININE 1.4 08/09/2014    Lab Results  Component Value Date   WBC 6.9 07/29/2013   HGB 15.1 07/29/2013   HCT 46 07/29/2013   PLT 213  07/29/2013   GLUCOSE 184* 05/04/2015   CHOL 135 05/04/2015   TRIG 155.0* 05/04/2015   HDL 34.50* 05/04/2015   LDLDIRECT 67.8 02/15/2013   LDLCALC 70 05/04/2015   ALT 22 05/04/2015   AST 21 05/04/2015   NA 139 05/04/2015   K 4.7 05/04/2015   CL 100 05/04/2015   CREATININE 1.53* 05/04/2015   BUN 21 05/04/2015   CO2 30 05/04/2015   TSH 2.50 05/04/2015   PSA 0.83 10/10/2014   INR 1.0 08/17/2012   HGBA1C 7.7* 05/04/2015   MICROALBUR 6.3* 05/09/2014    No results found.  Assessment & Plan:   Problem List Items Addressed This Visit      Unprioritized   Hyperlipidemia     LDL and triglycerides are at goal on current medications. He has no side effects and liver enzymes are normal. No  changes today   Lab Results  Component Value Date   CHOL 135 05/04/2015   HDL 34.50* 05/04/2015   LDLCALC 70 05/04/2015   LDLDIRECT 67.8 02/15/2013   TRIG 155.0* 05/04/2015   CHOLHDL 4 05/04/2015   Lab Results  Component Value Date   ALT 22 05/04/2015   AST 21 05/04/2015   ALKPHOS 41 05/04/2015   BILITOT 0.5 05/04/2015          Type 2 diabetes, uncontrolled, with peripheral circulatory disorder    Recent improvement in sugars and A1c.but not at goal.  Using Januvia, glipizide and metformin.  He has stopped the 75/25 insulin and does not want to resume insulin . Diet reviewedm he was advised to increase the evenign glipizide dose to full tablet if meal contains any starches. . Lab Results  Component Value Date   HGBA1C 7.7* 05/04/2015   Lab Results  Component Value Date   MICROALBUR 6.3* 05/09/2014          Peripheral vascular disease in diabetes mellitus    He has had semi annual follow up with Dr Lucky Cowboy this week       Diabetic neuropathy    Gabapentin dose increased today to 600 mg tid       BPH with obstruction/lower urinary tract symptoms    he has persistent nocturia with flomax and had angioedema with Myrbetriq.   Add finasteride today      Relevant Medications     finasteride (PROSCAR) 5 MG tablet    Other Visit Diagnoses    CKD (chronic kidney disease), stage 3 (moderate)    -  Primary    Relevant Orders    Basic metabolic panel       I have changed Mr. Gallien gabapentin. I am also having him start on finasteride. Additionally, I am having him maintain his INSULIN SYRINGE .5CC/29G, INSULIN SYRINGE .5CC/31GX5/16", metoprolol succinate, aspirin EC, traMADol, tamsulosin, diclofenac sodium, insulin lispro protamine-lispro, glipiZIDE, metFORMIN, atorvastatin, ONE TOUCH ULTRA SYSTEM KIT, glucose blood, amLODipine, fenofibrate micronized, magic mouthwash, famotidine, JANUVIA, HYDROcodone-acetaminophen, and losartan.  Meds ordered this encounter  Medications  . finasteride (PROSCAR) 5 MG tablet    Sig: Take 1 tablet (5 mg total) by mouth daily.    Dispense:  30 tablet    Refill:  2  . gabapentin (NEURONTIN) 300 MG capsule    Sig: Take 2 capsules (600 mg total) by mouth 3 (three) times daily.    Dispense:  180 capsule    Refill:  3    Medications Discontinued During This Encounter  Medication Reason  . gabapentin (NEURONTIN) 400 MG capsule Reorder    Follow-up: Return in about 3 months (around 08/04/2015) for follow up diabetes.   [name], MD

## 2015-05-04 NOTE — Progress Notes (Signed)
Pre-visit discussion using our clinic review tool. No additional management support is needed unless otherwise documented below in the visit note.  

## 2015-05-07 ENCOUNTER — Other Ambulatory Visit: Payer: Self-pay | Admitting: Internal Medicine

## 2015-05-07 DIAGNOSIS — N179 Acute kidney failure, unspecified: Secondary | ICD-10-CM

## 2015-05-07 NOTE — Assessment & Plan Note (Signed)
He has had semi annual follow up with Dr Lucky Cowboy this week

## 2015-05-07 NOTE — Assessment & Plan Note (Signed)
LDL and triglycerides are at goal on current medications. He has no side effects and liver enzymes are normal. No changes today   Lab Results  Component Value Date   CHOL 135 05/04/2015   HDL 34.50* 05/04/2015   LDLCALC 70 05/04/2015   LDLDIRECT 67.8 02/15/2013   TRIG 155.0* 05/04/2015   CHOLHDL 4 05/04/2015   Lab Results  Component Value Date   ALT 22 05/04/2015   AST 21 05/04/2015   ALKPHOS 41 05/04/2015   BILITOT 0.5 05/04/2015

## 2015-05-07 NOTE — Assessment & Plan Note (Addendum)
Recent improvement in sugars and A1c.but not at goal.  Using Januvia, glipizide and metformin.  He has stopped the 75/25 insulin and does not want to resume insulin . Diet reviewedm he was advised to increase the evenign glipizide dose to full tablet if meal contains any starches. . Lab Results  Component Value Date   HGBA1C 7.7* 05/04/2015   Lab Results  Component Value Date   MICROALBUR 6.3* 05/09/2014

## 2015-05-07 NOTE — Assessment & Plan Note (Signed)
Gabapentin dose increased today to 600 mg tid

## 2015-05-07 NOTE — Assessment & Plan Note (Signed)
he has persistent nocturia with flomax and had angioedema with Myrbetriq.   Add finasteride today

## 2015-05-18 ENCOUNTER — Other Ambulatory Visit (INDEPENDENT_AMBULATORY_CARE_PROVIDER_SITE_OTHER): Payer: Medicare Other

## 2015-05-18 ENCOUNTER — Telehealth: Payer: Self-pay | Admitting: Internal Medicine

## 2015-05-18 DIAGNOSIS — N179 Acute kidney failure, unspecified: Secondary | ICD-10-CM | POA: Diagnosis not present

## 2015-05-18 LAB — BASIC METABOLIC PANEL
BUN: 20 mg/dL (ref 6–23)
CO2: 31 mEq/L (ref 19–32)
Calcium: 9.5 mg/dL (ref 8.4–10.5)
Chloride: 100 mEq/L (ref 96–112)
Creatinine, Ser: 1.59 mg/dL — ABNORMAL HIGH (ref 0.40–1.50)
GFR: 44.74 mL/min — AB (ref >60.00)
Glucose, Bld: 133 mg/dL — ABNORMAL HIGH (ref 70–99)
Potassium: 4.6 mEq/L (ref 3.5–5.1)
Sodium: 139 mEq/L (ref 135–145)

## 2015-05-18 NOTE — Telephone Encounter (Signed)
In yellow folder

## 2015-05-18 NOTE — Telephone Encounter (Signed)
Pt dropped off blood sugar readings. Listing in Dr. Lupita Dawn box/msn

## 2015-05-22 NOTE — Telephone Encounter (Signed)
Lab Results  Component Value Date   HGBA1C 7.7* 05/04/2015   His fasting's and 2 hour post dinners are a little high .  Can he increase his glipizide to 10 mg twice daily before breakfast and dinner?

## 2015-05-22 NOTE — Telephone Encounter (Signed)
Patient notified and voiced understanding.

## 2015-05-29 ENCOUNTER — Other Ambulatory Visit: Payer: Self-pay | Admitting: Internal Medicine

## 2015-06-27 ENCOUNTER — Other Ambulatory Visit: Payer: Self-pay | Admitting: Internal Medicine

## 2015-07-17 ENCOUNTER — Other Ambulatory Visit: Payer: Self-pay | Admitting: Internal Medicine

## 2015-07-21 ENCOUNTER — Other Ambulatory Visit: Payer: Self-pay

## 2015-07-21 ENCOUNTER — Telehealth: Payer: Self-pay | Admitting: Internal Medicine

## 2015-07-21 MED ORDER — HYDROCODONE-ACETAMINOPHEN 5-325 MG PO TABS: 1.0000 | ORAL_TABLET | Freq: Four times a day (QID) | ORAL | Status: DC | PRN

## 2015-07-21 NOTE — Telephone Encounter (Signed)
Sent message to Dr. Derrel Nip regarding refill.

## 2015-07-21 NOTE — Telephone Encounter (Signed)
Last OV was 05/04/15. Please advise refill?

## 2015-07-21 NOTE — Telephone Encounter (Signed)
Ok to refill,  printed rx  

## 2015-07-21 NOTE — Telephone Encounter (Signed)
Needing refill on HYDROcodone-acetaminophen (NORCO/VICODIN) 5-325 MG  #90 today    Contact the patient at [phone] when ready

## 2015-08-07 ENCOUNTER — Ambulatory Visit
Admission: RE | Admit: 2015-08-07 | Discharge: 2015-08-07 | Disposition: A | Discharge status: Home / Self Care | Payer: Medicare Other | Source: Ambulatory Visit | Attending: Internal Medicine | Admitting: Internal Medicine

## 2015-08-07 ENCOUNTER — Encounter: Payer: Self-pay | Admitting: Internal Medicine

## 2015-08-07 ENCOUNTER — Ambulatory Visit (INDEPENDENT_AMBULATORY_CARE_PROVIDER_SITE_OTHER): Payer: Medicare Other | Admitting: Internal Medicine

## 2015-08-07 VITALS — BP 128/74 | HR 83 | Temp 98.0°F | Wt 200.0 lb

## 2015-08-07 DIAGNOSIS — R05 Cough: Secondary | ICD-10-CM | POA: Diagnosis not present

## 2015-08-07 DIAGNOSIS — N183 Chronic kidney disease, stage 3 unspecified: Secondary | ICD-10-CM

## 2015-08-07 DIAGNOSIS — IMO0002 Reserved for concepts with insufficient information to code with codable children: Secondary | ICD-10-CM

## 2015-08-07 DIAGNOSIS — E1142 Type 2 diabetes mellitus with diabetic polyneuropathy: Secondary | ICD-10-CM

## 2015-08-07 DIAGNOSIS — R058 Other specified cough: Secondary | ICD-10-CM

## 2015-08-07 DIAGNOSIS — E785 Hyperlipidemia, unspecified: Secondary | ICD-10-CM | POA: Diagnosis not present

## 2015-08-07 DIAGNOSIS — I739 Peripheral vascular disease, unspecified: Secondary | ICD-10-CM

## 2015-08-07 DIAGNOSIS — I10 Essential (primary) hypertension: Secondary | ICD-10-CM

## 2015-08-07 DIAGNOSIS — E1165 Type 2 diabetes mellitus with hyperglycemia: Secondary | ICD-10-CM | POA: Diagnosis not present

## 2015-08-07 DIAGNOSIS — J411 Mucopurulent chronic bronchitis: Secondary | ICD-10-CM

## 2015-08-07 DIAGNOSIS — E1151 Type 2 diabetes mellitus with diabetic peripheral angiopathy without gangrene: Secondary | ICD-10-CM | POA: Diagnosis not present

## 2015-08-07 LAB — COMPREHENSIVE METABOLIC PANEL
ALK PHOS: 48 U/L (ref 39–117)
ALT: 35 U/L (ref 0–53)
AST: 25 U/L (ref 0–37)
Albumin: 4.7 g/dL (ref 3.5–5.2)
BILIRUBIN TOTAL: 0.5 mg/dL (ref 0.2–1.2)
BUN: 18 mg/dL (ref 6–23)
CALCIUM: 10.3 mg/dL (ref 8.4–10.5)
CO2: 32 mEq/L (ref 19–32)
CREATININE: 1.32 mg/dL (ref 0.40–1.50)
Chloride: 100 mEq/L (ref 96–112)
GFR: 55.43 mL/min — ABNORMAL LOW (ref >60.00)
Glucose, Bld: 132 mg/dL — ABNORMAL HIGH (ref 70–99)
Potassium: 4.9 mEq/L (ref 3.5–5.1)
Sodium: 141 mEq/L (ref 135–145)
TOTAL PROTEIN: 7.3 g/dL (ref 6.0–8.3)

## 2015-08-07 LAB — LDL CHOLESTEROL, DIRECT: LDL DIRECT: 51 mg/dL

## 2015-08-07 LAB — HEMOGLOBIN A1C: HEMOGLOBIN A1C: 8.6 % — AB (ref 4.6–6.5)

## 2015-08-07 MED ORDER — SITAGLIPTIN PHOSPHATE 100 MG PO TABS: 100.0000 mg | ORAL_TABLET | Freq: Every day | ORAL | Status: DC

## 2015-08-07 MED ORDER — HYDROCODONE-ACETAMINOPHEN 5-325 MG PO TABS: 1.0000 | ORAL_TABLET | Freq: Four times a day (QID) | ORAL | Status: DC | PRN

## 2015-08-07 MED ORDER — GLIPIZIDE 10 MG PO TABS: 10.0000 mg | ORAL_TABLET | Freq: Two times a day (BID) | ORAL | Status: DC

## 2015-08-07 NOTE — Patient Instructions (Addendum)
I want you to get a chest x ray over at [facility].  You do not need an appointment.  Just go, they are open until 5 PM  I am increasing your Januvia to 100 mg daily starting in November .  Continue to take 10 mg of glipizide twice daily and metformin 1000 mg twice daily   I am concerned that your right leg cramps ar due to a circulation problem again so I will contact Dr Lucky Cowboy

## 2015-08-07 NOTE — Progress Notes (Signed)
Pre visit review using our clinic review tool, if applicable. No additional management support is needed unless otherwise documented below in the visit note. 

## 2015-08-07 NOTE — Progress Notes (Signed)
 Subjective:  Patient ID: Henry Lindsey, male    DOB: 07/09/1933  Age: 81 y.o. MRN: [id number]  CC: The primary encounter diagnosis was Recurrent productive cough. Diagnoses of CKD (chronic kidney disease), stage III, Type 2 diabetes, uncontrolled, with peripheral circulatory disorder (HCC), PAD (peripheral artery disease) (HCC), Diabetic polyneuropathy associated with type 2 diabetes mellitus (HCC), Essential hypertension, Hyperlipidemia, Peripheral vascular disease in diabetes mellitus (HCC), and Chronic bronchitis with productive mucopurulent cough (HCC) were also pertinent to this visit.  HPI [name] presents follow up  On uncontrolled  type 2 DM.  His control improved with referral to Endocrinology but with the departure of Dr Phadke he has returned to me fro follow up.   He is checking sugars once or twice daily,  His fastings sugars are > 150 50% of the time .  His other random and postprandial sugars are consistently high, but he thinks they are fine and observes that whenever he eats 'sugar" or fruit, they go over 200.  Has not used insulin in over 6 months.  He states that he is is taking his medications as directed.  2) PAD:  He is exercising regularly but is having recurrent right leg and calf cramps within 1 minute of walking on the treadmill or using the stationery bike.  Cramps subside with rest but recur with aeorobic activity .  He insists that  his last complete vascular evaluation was as recent as July 2016 with Dr Dew and that "everything was fine".  The last note available was from July 2015.  He has a history of PAD with right SFA occlusion that was opened via angioplasty in 2013.  History of prior CVA nd known CAD as well.  Takes lipitor and asa.     3) productive cough for the past  8 months. Coughs up thick grey sputum every morning that is 'foul smelling.' has brought a specimen for my examination in an olive jar. Denies fevers,  Sinus pain and dyspnea,    Quit smoking over  50 years ago  No weight loss, hemoptysis, or Chest pain .  No recent CXR.      Outpatient Prescriptions Prior to Visit  Medication Sig Dispense Refill  . Alum & Mag Hydroxide-Simeth (MAGIC MOUTHWASH) SOLN Take 5 mLs by mouth 3 (three) times daily as needed for mouth pain.    . amLODipine (NORVASC) 10 MG tablet TAKE 1 TABLET BY MOUTH EVERY DAY 30 tablet 6  . aspirin EC 81 MG tablet Take 1 tablet (81 mg total) by mouth daily. 90 tablet 3  . atorvastatin (LIPITOR) 40 MG tablet TAKE 1 TABLET BY MOUTH EVERY DAY 90 tablet 1  . Blood Glucose Monitoring Suppl (ONE TOUCH ULTRA SYSTEM KIT) W/DEVICE KIT 1 kit by Does not apply route once. Use DX code E11.59 1 each 0  . diclofenac sodium (VOLTAREN) 1 % GEL Apply 2 g topically 4 (four) times daily. 100 g 1  . famotidine (PEPCID) 20 MG tablet TAKE 1 TABLET(20 MG) BY MOUTH TWICE DAILY 60 tablet 0  . fenofibrate micronized (LOFIBRA) 134 MG capsule TAKE 1 CAPSULE BY MOUTH EVERY MORNING BEFORE BREAKFAST 30 capsule 5  . finasteride (PROSCAR) 5 MG tablet Take 1 tablet (5 mg total) by mouth daily. 30 tablet 2  . gabapentin (NEURONTIN) 300 MG capsule Take 2 capsules (600 mg total) by mouth 3 (three) times daily. 180 capsule 3  . glucose blood test strip Use as instructed three times daily 100 each   12  . INSULIN SYRINGE .5CC/29G 29G X 1/2" 0.5 ML MISC 1 Syringe by Does not apply route 3 (three) times daily. 100 each 6  . Insulin Syringe-Needle U-100 (INSULIN SYRINGE .5CC/31GX5/16") 31G X 5/16" 0.5 ML MISC USE THREE TIMES DAILY 100 each 5  . losartan (COZAAR) 100 MG tablet TAKE 1 TABLET BY MOUTH EVERY DAY 30 tablet 5  . metFORMIN (GLUCOPHAGE) 1000 MG tablet TAKE 1 TABLET BY MOUTH TWICE DAILY 180 tablet 0  . metoprolol succinate (TOPROL-XL) 100 MG 24 hr tablet TAKE 1 TABLET BY MOUTH ONCE DAILY 30 tablet 5  . tamsulosin (FLOMAX) 0.4 MG CAPS capsule Take 0.4 mg by mouth daily after breakfast.    . traMADol (ULTRAM) 50 MG tablet     . glipiZIDE (GLUCOTROL) 10 MG tablet  Take 1 tablet (10 mg total) by mouth 2 (two) times daily. (Patient taking differently: Take 5 mg by mouth 2 (two) times daily. ) 60 tablet 6  . HYDROcodone-acetaminophen (NORCO/VICODIN) 5-325 MG tablet Take 1 tablet by mouth every 6 (six) hours as needed. 90 tablet 0  . JANUVIA 50 MG tablet Take 1 tablet (50 mg total) by mouth daily. 30 tablet 5  . insulin lispro protamine-lispro (HUMALOG MIX 75/25) (75-25) 100 UNIT/ML SUSP injection Inject 15 units [place] every morning and 20units every evening (Patient not taking: Reported on 05/04/2015) 10 mL 0   No facility-administered medications prior to visit.    Review of Systems;  Patient denies headache, fevers, malaise, unintentional weight loss, skin rash, eye pain, sinus congestion and sinus pain, sore throat, dysphagia,  hemoptysis , cough, dyspnea, wheezing, chest pain, palpitations, orthopnea, edema, abdominal pain, nausea, melena, diarrhea, constipation, flank pain, dysuria, hematuria, urinary  Frequency, nocturia, numbness, tingling, seizures,  Focal weakness, Loss of consciousness,  Tremor, insomnia, depression, anxiety, and suicidal ideation.      Objective:  BP 128/74 mmHg  Pulse 83  Temp(Src) 98 F (36.7 C) (Oral)  Wt 200 lb (90.719 kg)  SpO2 94%  BP Readings from Last 3 Encounters:  08/07/15 128/74  05/04/15 136/66  04/06/15 126/62    Wt Readings from Last 3 Encounters:  08/07/15 200 lb (90.719 kg)  05/04/15 196 lb 8 oz (89.132 kg)  04/06/15 196 lb 12 oz (89.245 kg)    General appearance: alert, cooperative and appears stated age Ears: normal TM's and external ear canals both ears Throat: lips, mucosa, and tongue normal; teeth and gums normal Neck: no adenopathy, no carotid bruit, supple, symmetrical, trachea midline and thyroid not enlarged, symmetric, no tenderness/mass/nodules Back: symmetric, no curvature. ROM normal. No CVA tenderness. Lungs: clear to auscultation bilaterally Heart: regular rate and rhythm, S1, S2  normal, no murmur, click, rub or gallop Abdomen: soft, non-tender; bowel sounds normal; no masses,  no organomegaly Pulses: 2+ and symmetric Skin: Skin color, texture, turgor normal. No rashes or lesions Lymph nodes: Cervical, supraclavicular, and axillary nodes normal.  Lab Results  Component Value Date   HGBA1C 8.6* 08/07/2015   HGBA1C 7.7* 05/04/2015   HGBA1C 7.9* 01/19/2015    Lab Results  Component Value Date   CREATININE 1.32 08/07/2015   CREATININE 1.59* 05/18/2015   CREATININE 1.53* 05/04/2015    Lab Results  Component Value Date   WBC 6.9 07/29/2013   HGB 15.1 07/29/2013   HCT 46 07/29/2013   PLT 213 07/29/2013   GLUCOSE 132* 08/07/2015   CHOL 135 05/04/2015   TRIG 155.0* 05/04/2015   HDL 34.50* 05/04/2015   LDLDIRECT 51.0 08/07/2015  LDLCALC 70 05/04/2015   ALT 35 08/07/2015   AST 25 08/07/2015   NA 141 08/07/2015   K 4.9 08/07/2015   CL 100 08/07/2015   CREATININE 1.32 08/07/2015   BUN 18 08/07/2015   CO2 32 08/07/2015   TSH 2.50 05/04/2015   PSA 0.83 10/10/2014   INR 1.0 08/17/2012   HGBA1C 8.6* 08/07/2015   MICROALBUR 6.3* 05/09/2014    No results found.  Assessment & Plan:   Problem List Items Addressed This Visit    Hyperlipidemia     LDL and triglycerides are at goal on current dose of atorvastatin. He has no side effects and liver enzymes are normal. No changes today   Lab Results  Component Value Date   CHOL 135 05/04/2015   HDL 34.50* 05/04/2015   LDLCALC 70 05/04/2015   LDLDIRECT 51.0 08/07/2015   TRIG 155.0* 05/04/2015   CHOLHDL 4 05/04/2015   Lab Results  Component Value Date   ALT 35 08/07/2015   AST 25 08/07/2015   ALKPHOS 48 08/07/2015   BILITOT 0.5 08/07/2015            Hypertension    Well controlled on current regimen. Renal function stable, no changes today.  Lab Results  Component Value Date   CREATININE 1.32 08/07/2015   Lab Results  Component Value Date   NA 141 08/07/2015   K 4.9 08/07/2015   CL  100 08/07/2015   CO2 32 08/07/2015         Type 2 diabetes, uncontrolled, with peripheral circulatory disorder (HCC)    Recent decline in control on med dose Januvia, maximal  Dose  glipizide and metformin.  He has not been using the 75/25 insulin and does not want to resume insulin .  I have increased the Januvia to 100 mg daily and will see him in one month. . Lab Results  Component Value Date   HGBA1C 8.6* 08/07/2015   Lab Results  Component Value Date   MICROALBUR 6.3* 05/09/2014            Relevant Medications   sitaGLIPtin (JANUVIA) 100 MG tablet   glipiZIDE (GLUCOTROL) 10 MG tablet   Other Relevant Orders   Comprehensive metabolic panel (Completed)   Hemoglobin A1c (Completed)   Microalbumin / creatinine urine ratio   Peripheral vascular disease in diabetes mellitus ([place])    His right calf claudication symptoms and diminished  DP pulse are concerning for recurrent SFA occlusion.  Return to Dr Lucky Cowboy.       Relevant Medications   sitaGLIPtin (JANUVIA) 100 MG tablet   glipiZIDE (GLUCOTROL) 10 MG tablet   Diabetic neuropathy (HCC)    Feet are examined today and show decreased sensation to light touch,; however, no pressure ulcers are noted.       Relevant Medications   sitaGLIPtin (JANUVIA) 100 MG tablet   glipiZIDE (GLUCOTROL) 10 MG tablet   Chronic bronchitis with productive mucopurulent cough (HCC)    Chest x ray is clear and he is not wheezing.  Doxycycline 100 mg bid x 7 days.  Daily use of Probiotics for  3 weeks advised to reduce risk of C dificile colitis.       CKD (chronic kidney disease), stage III   PAD (peripheral artery disease) ([place])   Relevant Orders   LDL cholesterol, direct (Completed)   Ambulatory referral to Vascular Surgery    Other Visit Diagnoses    Recurrent productive cough    -  Primary  Relevant Orders    DG Chest 2 View (Completed)       I have discontinued Mr. Tesoriero's insulin lispro protamine-lispro and JANUVIA. I am also  having him start on sitaGLIPtin and doxycycline. Additionally, I am having him maintain his INSULIN SYRINGE .5CC/29G, INSULIN SYRINGE .5CC/31GX5/16", metoprolol succinate, aspirin EC, traMADol, tamsulosin, diclofenac sodium, ONE TOUCH ULTRA SYSTEM KIT, glucose blood, amLODipine, fenofibrate micronized, magic mouthwash, losartan, finasteride, gabapentin, metFORMIN, famotidine, atorvastatin, HYDROcodone-acetaminophen, and glipiZIDE.  Meds ordered this encounter  Medications  . DISCONTD: glipiZIDE (GLUCOTROL) 10 MG tablet    Sig: Take 1 tablet (10 mg total) by mouth 2 (two) times daily.    Dispense:  60 tablet    Refill:  6  . sitaGLIPtin (JANUVIA) 100 MG tablet    Sig: Take 1 tablet (100 mg total) by mouth daily. NOTE DOSE INCREASE.  DO NOT FILL UNTIL August 22 2015    Dispense:  30 tablet    Refill:  5  . DISCONTD: HYDROcodone-acetaminophen (NORCO/VICODIN) 5-325 MG tablet    Sig: Take 1 tablet by mouth every 6 (six) hours as needed.    Dispense:  90 tablet    Refill:  0    May Refill on or after August 22 2015  . DISCONTD: HYDROcodone-acetaminophen (NORCO/VICODIN) 5-325 MG tablet    Sig: Take 1 tablet by mouth every 6 (six) hours as needed.    Dispense:  90 tablet    Refill:  0    May Refill on or after September 21 2015  . HYDROcodone-acetaminophen (NORCO/VICODIN) 5-325 MG tablet    Sig: Take 1 tablet by mouth every 6 (six) hours as needed.    Dispense:  90 tablet    Refill:  0    May Refill on or after October 22 2015  . doxycycline (VIBRAMYCIN) 100 MG capsule    Sig: Take 1 capsule (100 mg total) by mouth 2 (two) times daily.    Dispense:  14 capsule    Refill:  0    PLEASE REMIND PATIENT TO TAKE A GENERIC PROBIOTIC FOR 3 WEEKS,  TO PREVENT DIARRHEA  . glipiZIDE (GLUCOTROL) 10 MG tablet    Sig: Take 1 tablet (10 mg total) by mouth 2 (two) times daily.    Dispense:  60 tablet    Refill:  6    Medications Discontinued During This Encounter  Medication Reason  . glipiZIDE  (GLUCOTROL) 10 MG tablet Reorder  . JANUVIA 50 MG tablet   . HYDROcodone-acetaminophen (NORCO/VICODIN) 5-325 MG tablet Reorder  . HYDROcodone-acetaminophen (NORCO/VICODIN) 5-325 MG tablet Reorder  . HYDROcodone-acetaminophen (NORCO/VICODIN) 5-325 MG tablet Reorder  . insulin lispro protamine-lispro (HUMALOG MIX 75/25) (75-25) 100 UNIT/ML SUSP injection   . glipiZIDE (GLUCOTROL) 10 MG tablet   . glipiZIDE (GLUCOTROL) 10 MG tablet Reorder    Follow-up: Return in about 3 months (around 11/07/2015) for follow up diabetes.   TULLO, TERESA L, MD  

## 2015-08-08 DIAGNOSIS — J42 Unspecified chronic bronchitis: Secondary | ICD-10-CM

## 2015-08-08 DIAGNOSIS — J209 Acute bronchitis, unspecified: Secondary | ICD-10-CM | POA: Insufficient documentation

## 2015-08-08 LAB — MICROALBUMIN / CREATININE URINE RATIO
CREATININE, U: 79.3 mg/dL
MICROALB/CREAT RATIO: 4.9 mg/g (ref 0.0–30.0)
Microalb, Ur: 3.9 mg/dL — ABNORMAL HIGH (ref 0.0–1.9)

## 2015-08-08 MED ORDER — DOXYCYCLINE HYCLATE 100 MG PO CAPS: 100.0000 mg | ORAL_CAPSULE | Freq: Two times a day (BID) | ORAL | Status: DC

## 2015-08-08 MED ORDER — GLIPIZIDE 10 MG PO TABS: 10.0000 mg | ORAL_TABLET | Freq: Two times a day (BID) | ORAL | Status: DC

## 2015-08-08 NOTE — Assessment & Plan Note (Signed)
Chest x ray is clear and he is not wheezing.  Doxycycline 100 mg bid x 7 days.  Daily use of Probiotics for  3 weeks advised to reduce risk of C dificile colitis.

## 2015-08-08 NOTE — Assessment & Plan Note (Signed)
Recent decline in control on med dose Januvia, maximal  Dose  glipizide and metformin.  He has not been using the 75/25 insulin and does not want to resume insulin .  I have increased the Januvia to 100 mg daily and will see him in one month. . Lab Results  Component Value Date   HGBA1C 8.6* 08/07/2015   Lab Results  Component Value Date   MICROALBUR 6.3* 05/09/2014

## 2015-08-08 NOTE — Assessment & Plan Note (Signed)
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  Return to Dr Lucky Cowboy.

## 2015-08-08 NOTE — Assessment & Plan Note (Signed)
Feet are examined today and show decreased sensation to light touch,; however, no pressure ulcers are noted.

## 2015-08-08 NOTE — Assessment & Plan Note (Signed)
Well controlled on current regimen. Renal function stable, no changes today.  Lab Results  Component Value Date   CREATININE 1.32 08/07/2015   Lab Results  Component Value Date   NA 141 08/07/2015   K 4.9 08/07/2015   CL 100 08/07/2015   CO2 32 08/07/2015

## 2015-08-08 NOTE — Assessment & Plan Note (Signed)
LDL and triglycerides are at goal on current dose of atorvastatin. He has no side effects and liver enzymes are normal. No changes today   Lab Results  Component Value Date   CHOL 135 05/04/2015   HDL 34.50* 05/04/2015   LDLCALC 70 05/04/2015   LDLDIRECT 51.0 08/07/2015   TRIG 155.0* 05/04/2015   CHOLHDL 4 05/04/2015   Lab Results  Component Value Date   ALT 35 08/07/2015   AST 25 08/07/2015   ALKPHOS 48 08/07/2015   BILITOT 0.5 08/07/2015

## 2015-08-15 ENCOUNTER — Telehealth: Payer: Self-pay | Admitting: Internal Medicine

## 2015-08-15 MED ORDER — DOXYCYCLINE HYCLATE 100 MG PO CAPS: 100.0000 mg | ORAL_CAPSULE | Freq: Two times a day (BID) | ORAL | Status: DC

## 2015-08-15 MED ORDER — SITAGLIPTIN PHOSPHATE 100 MG PO TABS: 100.0000 mg | ORAL_TABLET | Freq: Every day | ORAL | Status: DC

## 2015-08-15 NOTE — Telephone Encounter (Signed)
Pt called to give information regarding doxycycline (VIBRAMYCIN) 100 MG capsule pt says it seems to be working but not cleared up just yet. Pt has 1 more pill left. Pt went to Dr Lucky Cowboy and he gave medication cilostazol 100 mg 30 pills to try out to remove the blood clot. Pt states the medication needs to be called in sitaGLIPtin (JANUVIA) 100 MG tablet. Pharmacy is [facility]. Thank you!  FYI, Pt was very rude to me while I was taking his message. I asked the pt to clarify the spelling of the Doctors name. Pt states to me "What school did I go too" . I stated I was trying to understand what you said and clarifying the spelling of the doctors name.

## 2015-08-15 NOTE — Telephone Encounter (Signed)
Both sent to pharmacy.  Please inform patient that rx has been sent and that his rudeness to my staff will not be tolerated a second time . If he can't be polite, he can find another physician's office to use.

## 2015-08-15 NOTE — Telephone Encounter (Signed)
Tried to return call to patient as to refill for Januvia that is awaiting at Pharmacy to be filled. No answer could not leave voicemail not set up. Please advise as to Doxycycline.

## 2015-08-15 NOTE — Telephone Encounter (Signed)
Notified patient scripts called to pharmacy, while speaking with patient as to medication patient advised that staff was incompetent because he had to spell medications for staff thanked patient and noted comments.

## 2015-08-24 ENCOUNTER — Other Ambulatory Visit: Payer: Self-pay | Admitting: Internal Medicine

## 2015-08-27 ENCOUNTER — Other Ambulatory Visit: Payer: Self-pay | Admitting: Internal Medicine

## 2015-08-28 ENCOUNTER — Other Ambulatory Visit: Payer: Self-pay | Admitting: Internal Medicine

## 2015-09-08 ENCOUNTER — Encounter: Payer: Self-pay | Admitting: Internal Medicine

## 2015-09-08 ENCOUNTER — Ambulatory Visit (INDEPENDENT_AMBULATORY_CARE_PROVIDER_SITE_OTHER): Payer: Medicare Other | Admitting: Internal Medicine

## 2015-09-08 VITALS — BP 106/60 | HR 83 | Temp 98.0°F | Resp 18 | Ht 70.0 in | Wt 198.8 lb

## 2015-09-08 DIAGNOSIS — E559 Vitamin D deficiency, unspecified: Secondary | ICD-10-CM

## 2015-09-08 DIAGNOSIS — E1151 Type 2 diabetes mellitus with diabetic peripheral angiopathy without gangrene: Secondary | ICD-10-CM | POA: Diagnosis not present

## 2015-09-08 DIAGNOSIS — M1712 Unilateral primary osteoarthritis, left knee: Secondary | ICD-10-CM

## 2015-09-08 DIAGNOSIS — N528 Other male erectile dysfunction: Secondary | ICD-10-CM | POA: Diagnosis not present

## 2015-09-08 DIAGNOSIS — I10 Essential (primary) hypertension: Secondary | ICD-10-CM

## 2015-09-08 DIAGNOSIS — E1165 Type 2 diabetes mellitus with hyperglycemia: Secondary | ICD-10-CM

## 2015-09-08 DIAGNOSIS — I739 Peripheral vascular disease, unspecified: Secondary | ICD-10-CM

## 2015-09-08 DIAGNOSIS — IMO0002 Reserved for concepts with insufficient information to code with codable children: Secondary | ICD-10-CM

## 2015-09-08 DIAGNOSIS — E785 Hyperlipidemia, unspecified: Secondary | ICD-10-CM

## 2015-09-08 DIAGNOSIS — N529 Male erectile dysfunction, unspecified: Secondary | ICD-10-CM

## 2015-09-08 MED ORDER — GLUCOSE BLOOD VI STRP: ORAL_STRIP | Status: DC

## 2015-09-08 MED ORDER — CILOSTAZOL 100 MG PO TABS
100.0000 mg | ORAL_TABLET | Freq: Two times a day (BID) | ORAL | Status: DC
Start: 2015-09-08 — End: 2015-12-11

## 2015-09-08 NOTE — Patient Instructions (Signed)
Please resume the cilostazol 100 mg twice daily that Dr Lucky Cowboy prescribed you for your circulation .  I have refilled it for you. Take if for 3 months and go back to see Dr Lucky Cowboy in February.  If you do not tolerate the medication,  Do not stop it on your own.  Call Dr Lucky Cowboy and let him know so he can arrange another way to manage your circulation problem    You are due for diabetes labs in mid January(after Jan 18th)

## 2015-09-08 NOTE — Progress Notes (Signed)
Subjective:  Patient ID: Henry Lindsey, male    DOB: [birth year]  Age: 81 y.o. MRN: [id number]  CC: The primary encounter diagnosis was Type 2 diabetes, uncontrolled, with peripheral circulatory disorder ([place]). Diagnoses of PAD (peripheral artery disease) ([place]), Vitamin D deficiency, Impotence due to erectile dysfunction, Peripheral vascular disease in diabetes mellitus ([place]), Primary osteoarthritis of left knee, Essential hypertension, and Hyperlipidemia were also pertinent to this visit.  HPI Henry Lindsey presents for follow up on uncontrolled type 2 DM with PAD  ``````````````````   He feels generally well, is exercising several times per week and checking blood sugars once daily at variable times.  BS have been at variable times  under 130 fasting and < 155 post prandially. Since the Januvia dose was increased after last visit.    Denies any recent hypoglyemic events.  Taking his medications as directed. Following a carbohydrate modified diet 6 days per week. Denies numbness, burning and tingling of extremities. Appetite is good.    Cough has resolved  Was having leg pain in the right ,  Saw Henry Lindsey   Was (per Henry Lindsey offered angioplasty which was deferred. Was given rx for cilostazol and told to return in 3 months.   15 days and has not had it refilled.    Exercising regularly,  Had prednisone injections in right knee and right hip last week,  Left knee was injected six months  Which helped,  S/p left TKR    Says that he has lost 10 lbs by his scales at home   Outpatient Prescriptions Prior to Visit  Medication Sig Dispense Refill  . Alum & Mag Hydroxide-Simeth (MAGIC MOUTHWASH) SOLN Take 5 mLs by mouth 3 (three) times daily as needed for mouth pain.    [name] amLODipine (NORVASC) 10 MG tablet TAKE 1 TABLET BY MOUTH EVERY DAY 30 tablet 6  . aspirin EC 81 MG tablet Take 1 tablet (81 mg total) by mouth daily. 90 tablet 3  . atorvastatin (LIPITOR) 40 MG tablet TAKE 1 TABLET BY MOUTH EVERY DAY 90  tablet 1  . Blood Glucose Monitoring Suppl (ONE TOUCH ULTRA SYSTEM KIT) W/DEVICE KIT 1 kit by Does not apply route once. Use DX code E11.59 1 each 0  . diclofenac sodium (VOLTAREN) 1 % GEL Apply 2 g topically 4 (four) times daily. 100 g 1  . doxycycline (VIBRAMYCIN) 100 MG capsule Take 1 capsule (100 mg total) by mouth 2 (two) times daily. 14 capsule 0  . doxycycline (VIBRAMYCIN) 100 MG capsule Take 1 capsule (100 mg total) by mouth 2 (two) times daily. 6 capsule 0  . famotidine (PEPCID) 20 MG tablet TAKE 1 TABLET(20 MG) BY MOUTH TWICE DAILY 60 tablet 0  . fenofibrate micronized (LOFIBRA) 134 MG capsule TAKE 1 CAPSULE BY MOUTH EVERY MORNING BEFORE BREAKFAST 30 capsule 0  . finasteride (PROSCAR) 5 MG tablet Take 1 tablet (5 mg total) by mouth daily. 30 tablet 2  . gabapentin (NEURONTIN) 300 MG capsule TAKE 2 CAPSULES(600 MG) BY MOUTH THREE TIMES DAILY 180 capsule 0  . glipiZIDE (GLUCOTROL) 10 MG tablet Take 1 tablet (10 mg total) by mouth 2 (two) times daily. 60 tablet 6  . HYDROcodone-acetaminophen (NORCO/VICODIN) 5-325 MG tablet Take 1 tablet by mouth every 6 (six) hours as needed. 90 tablet 0  . INSULIN SYRINGE .5CC/29G 29G X 1/2" 0.5 ML MISC 1 Syringe by Does not apply route 3 (three) times daily. 100 each 6  . Insulin Syringe-Needle U-100 (INSULIN  SYRINGE .5CC/31GX5/16") 31G X 5/16" 0.5 ML MISC USE THREE TIMES DAILY 100 each 5  . losartan (COZAAR) 100 MG tablet TAKE 1 TABLET BY MOUTH EVERY DAY 30 tablet 5  . metFORMIN (GLUCOPHAGE) 1000 MG tablet TAKE 1 TABLET BY MOUTH TWICE DAILY 180 tablet 0  . metoprolol succinate (TOPROL-XL) 100 MG 24 hr tablet TAKE 1 TABLET BY MOUTH ONCE DAILY 30 tablet 5  . sitaGLIPtin (JANUVIA) 100 MG tablet Take 1 tablet (100 mg total) by mouth daily. NOTE DOSE INCREASE. 30 tablet 5  . tamsulosin (FLOMAX) 0.4 MG CAPS capsule Take 0.4 mg by mouth daily after breakfast.    . traMADol (ULTRAM) 50 MG tablet     . glucose blood test strip Use as instructed three times daily  100 each 12   No facility-administered medications prior to visit.    Review of Systems;  Patient denies headache, fevers, malaise, unintentional weight loss, skin rash, eye pain, sinus congestion and sinus pain, sore throat, dysphagia,  hemoptysis , cough, dyspnea, wheezing, chest pain, palpitations, orthopnea, edema, abdominal pain, nausea, melena, diarrhea, constipation, flank pain, dysuria, hematuria, urinary  Frequency, nocturia, numbness, tingling, seizures,  Focal weakness, Loss of consciousness,  Tremor, insomnia, depression, anxiety, and suicidal ideation.      Objective:  BP 106/60 mmHg  Pulse 83  Temp(Src) 98 F (36.7 C)  Resp 18  Ht '5\' 10"'  (1.778 m)  Wt 198 lb 12.8 oz (90.175 kg)  BMI 28.52 kg/m2  SpO2 94%  BP Readings from Last 3 Encounters:  09/08/15 106/60  08/07/15 128/74  05/04/15 136/66    Wt Readings from Last 3 Encounters:  09/08/15 198 lb 12.8 oz (90.175 kg)  08/07/15 200 lb (90.719 kg)  05/04/15 196 lb 8 oz (89.132 kg)    General appearance: alert, cooperative and appears stated age Ears: normal TM's and external ear canals both ears Throat: lips, mucosa, and tongue normal; teeth and gums normal Neck: no adenopathy, no carotid bruit, supple, symmetrical, trachea midline and thyroid not enlarged, symmetric, no tenderness/mass/nodules Back: symmetric, no curvature. ROM normal. No CVA tenderness. Lungs: clear to auscultation bilaterally Heart: regular rate and rhythm, S1, S2 normal, no murmur, click, rub or gallop Abdomen: soft, non-tender; bowel sounds normal; no masses,  no organomegaly Pulses: 2+ and symmetric Skin: Skin color, texture, turgor normal. No rashes or lesions Lymph nodes: Cervical, supraclavicular, and axillary nodes normal.  Lab Results  Component Value Date   HGBA1C 8.6* 08/07/2015   HGBA1C 7.7* 05/04/2015   HGBA1C 7.9* 01/19/2015    Lab Results  Component Value Date   CREATININE 1.32 08/07/2015   CREATININE 1.59* 05/18/2015    CREATININE 1.53* 05/04/2015    Lab Results  Component Value Date   WBC 6.9 07/29/2013   HGB 15.1 07/29/2013   HCT 46 07/29/2013   PLT 213 07/29/2013   GLUCOSE 132* 08/07/2015   CHOL 135 05/04/2015   TRIG 155.0* 05/04/2015   HDL 34.50* 05/04/2015   LDLDIRECT 51.0 08/07/2015   LDLCALC 70 05/04/2015   ALT 35 08/07/2015   AST 25 08/07/2015   NA 141 08/07/2015   K 4.9 08/07/2015   CL 100 08/07/2015   CREATININE 1.32 08/07/2015   BUN 18 08/07/2015   CO2 32 08/07/2015   TSH 2.50 05/04/2015   PSA 0.83 10/10/2014   INR 1.0 08/17/2012   HGBA1C 8.6* 08/07/2015   MICROALBUR 3.9* 08/07/2015    Dg Chest 2 View  08/08/2015  CLINICAL DATA:  Productive cough, recurrent. EXAM: CHEST  2 VIEW COMPARISON:  04/08/2012 FINDINGS: There is no edema, consolidation, effusion, or pneumothorax. Ovoid density overlapping the apical heart in the lateral projection is chronic and osseous appearing. Diffuse bridging thoracic spine osteophytes or syndesmophytes, chronic. Normal heart size and aortic contours. Remote right acromioclavicular separation with chronic offset. IMPRESSION: No evidence of active disease or change since 2013. Electronically Signed   By: Monte Fantasia M.D.   On: 08/08/2015 08:32    Assessment & Plan:   Problem List Items Addressed This Visit    Left knee DJD    Severe, with recent Synvisc injections by Henry Lindsey in left knee and steroid in right knee. Some improvement.         Hyperlipidemia     LDL and triglycerides are at goal on current dose of atorvastatin. He has no side effects and liver enzymes are normal. No changes today   Lab Results  Component Value Date   CHOL 135 05/04/2015   HDL 34.50* 05/04/2015   LDLCALC 70 05/04/2015   LDLDIRECT 51.0 08/07/2015   TRIG 155.0* 05/04/2015   CHOLHDL 4 05/04/2015   Lab Results  Component Value Date   ALT 35 08/07/2015   AST 25 08/07/2015   ALKPHOS 48 08/07/2015   BILITOT 0.5 08/07/2015               Hypertension    Well controlled on current regimen. Renal function stable, no changes today.      Type 2 diabetes, uncontrolled, with peripheral circulatory disorder ([place]) - Primary    He continues to report blood sugars that do not correlate with his A1cs.  Without a log of his blood sugars i cannot adjust his medications.  He does not want to use insulin.  He insists that  The last month of sugars has been improved because of the increase in Januvia dose. H ewill return in mid [place] for repeat labs.   Lab Results  Component Value Date   HGBA1C 8.6* 08/07/2015   Lab Results  Component Value Date   MICROALBUR 3.9* 08/07/2015        Relevant Orders   Comprehensive metabolic panel   Hemoglobin A1c   LDL cholesterol, direct   Lipid panel   Microalbumin / creatinine urine ratio   Peripheral vascular disease in diabetes mellitus ([place])    His recent evaluation by Henry Lindsey resulted in prescription of cilastozol which he stopped after two months when his leg pain resolved.  He misunderstood Henry. Bunnie Lindsey instructions. But also states that did not tolerate the medication but cannot be more specific.  He is willing to resume the medication and follow up with Henry Lindsey in 3 months.       Vitamin D deficiency   Impotence due to erectile dysfunction   PAD (peripheral artery disease) ([place])   Relevant Orders   Ambulatory referral to Vascular Surgery      I am having Henry Lindsey start on cilostazol. I am also having him maintain his INSULIN SYRINGE .5CC/29G, INSULIN SYRINGE .5CC/31GX5/16", metoprolol succinate, aspirin EC, traMADol, tamsulosin, diclofenac sodium, ONE TOUCH ULTRA SYSTEM KIT, magic mouthwash, losartan, finasteride, atorvastatin, HYDROcodone-acetaminophen, glipiZIDE, doxycycline, doxycycline, sitaGLIPtin, famotidine, amLODipine, metFORMIN, gabapentin, fenofibrate micronized, and glucose blood.  Meds ordered this encounter  Medications  . cilostazol (PLETAL) 100 MG tablet    Sig: Take 1 tablet  (100 mg total) by mouth 2 (two) times daily.    Dispense:  60 tablet    Refill:  2  . glucose blood test  strip    Sig: Use as instructed three times daily    Dispense:  100 each    Refill:  12    DX code: E11.59    Medications Discontinued During This Encounter  Medication Reason  . glucose blood test strip Reorder    Follow-up: Return in about 2 months (around 11/08/2015).   Henry Mc, MD

## 2015-09-08 NOTE — Progress Notes (Signed)
Pre visit review using our clinic review tool, if applicable. No additional management support is needed unless otherwise documented below in the visit note. 

## 2015-09-10 NOTE — Assessment & Plan Note (Signed)
Severe, with recent Synvisc injections by Margaretmary Eddy in left knee and steroid in right knee. Some improvement.

## 2015-09-10 NOTE — Assessment & Plan Note (Signed)
His recent evaluation by Dr Lucky Cowboy resulted in prescription of cilastozol which he stopped after two months when his leg pain resolved.  He misunderstood Dr. Bunnie Domino instructions. But also states that did not tolerate the medication but cannot be more specific.  He is willing to resume the medication and follow up with Dew in 3 months.

## 2015-09-10 NOTE — Assessment & Plan Note (Signed)
He continues to report blood sugars that do not correlate with his A1cs.  Without a log of his blood sugars i cannot adjust his medications.  He does not want to use insulin.  He insists that  The last month of sugars has been improved because of the increase in Januvia dose. H ewill return in mid [place] for repeat labs.   Lab Results  Component Value Date   HGBA1C 8.6* 08/07/2015   Lab Results  Component Value Date   MICROALBUR 3.9* 08/07/2015

## 2015-09-10 NOTE — Assessment & Plan Note (Signed)
Well controlled on current regimen. Renal function stable, no changes today. 

## 2015-09-10 NOTE — Assessment & Plan Note (Signed)
LDL and triglycerides are at goal on current dose of atorvastatin. He has no side effects and liver enzymes are normal. No changes today   Lab Results  Component Value Date   CHOL 135 05/04/2015   HDL 34.50* 05/04/2015   LDLCALC 70 05/04/2015   LDLDIRECT 51.0 08/07/2015   TRIG 155.0* 05/04/2015   CHOLHDL 4 05/04/2015   Lab Results  Component Value Date   ALT 35 08/07/2015   AST 25 08/07/2015   ALKPHOS 48 08/07/2015   BILITOT 0.5 08/07/2015       

## 2015-09-18 ENCOUNTER — Other Ambulatory Visit: Payer: Self-pay | Admitting: Internal Medicine

## 2015-09-26 ENCOUNTER — Other Ambulatory Visit: Payer: Self-pay | Admitting: Internal Medicine

## 2015-10-10 ENCOUNTER — Other Ambulatory Visit: Payer: Self-pay | Admitting: Internal Medicine

## 2015-10-18 ENCOUNTER — Telehealth: Payer: Self-pay | Admitting: Internal Medicine

## 2015-10-18 MED ORDER — ONETOUCH ULTRA SYSTEM W/DEVICE KIT: 1.0000 | PACK | Freq: Once | Status: DC

## 2015-10-18 MED ORDER — GLUCOSE BLOOD VI STRP: ORAL_STRIP | Status: DC

## 2015-10-18 MED ORDER — ONETOUCH DELICA LANCETS 33G MISC: Status: DC

## 2015-10-18 NOTE — Telephone Encounter (Signed)
Pt called needing a refill on Blood Glucose Monitoring Suppl (ONE TOUCH ULTRA SYSTEM KIT) W/DEVICE KIT ,Insulin Syringe-Needle U-100 (INSULIN SYRINGE .5CC/31GX5/16") 31G X 5/16" 0.5 ML MISC ,glucose blood test strip. Pharmacy is [facility] [id number] - [place], [place] states he called and left a message for [facility]. Thank You!

## 2015-10-18 NOTE — Telephone Encounter (Signed)
Kathy, Please advise?  

## 2015-10-18 NOTE — Telephone Encounter (Signed)
Refills sent as requested refill sent as requested. Notified patient scripts sent as requested.

## 2015-11-01 ENCOUNTER — Telehealth: Payer: Self-pay

## 2015-11-01 NOTE — Telephone Encounter (Signed)
A prior auth came over fax for One Touch Delica Lancets 3G [id number], I called to get prior auth for the lancets but they are not currently covered by insurance. I notified pt at home that the lancets are not covered and he stated that Dr.Tullo gave him a new machine with strips and lancets and that he was good, we disregard the prior auth. tvw

## 2015-11-09 ENCOUNTER — Other Ambulatory Visit: Payer: Self-pay | Admitting: Internal Medicine

## 2015-11-10 ENCOUNTER — Other Ambulatory Visit (INDEPENDENT_AMBULATORY_CARE_PROVIDER_SITE_OTHER): Payer: Medicare Other

## 2015-11-10 DIAGNOSIS — E1165 Type 2 diabetes mellitus with hyperglycemia: Secondary | ICD-10-CM

## 2015-11-10 DIAGNOSIS — IMO0002 Reserved for concepts with insufficient information to code with codable children: Secondary | ICD-10-CM

## 2015-11-10 DIAGNOSIS — N183 Chronic kidney disease, stage 3 (moderate): Secondary | ICD-10-CM | POA: Diagnosis not present

## 2015-11-10 DIAGNOSIS — E1151 Type 2 diabetes mellitus with diabetic peripheral angiopathy without gangrene: Secondary | ICD-10-CM | POA: Diagnosis not present

## 2015-11-10 LAB — COMPREHENSIVE METABOLIC PANEL
ALBUMIN: 4.4 g/dL (ref 3.5–5.2)
ALK PHOS: 42 U/L (ref 39–117)
ALT: 22 U/L (ref 0–53)
AST: 21 U/L (ref 0–37)
BILIRUBIN TOTAL: 0.4 mg/dL (ref 0.2–1.2)
BUN: 27 mg/dL — ABNORMAL HIGH (ref 6–23)
CALCIUM: 9.8 mg/dL (ref 8.4–10.5)
CO2: 29 mEq/L (ref 19–32)
Chloride: 100 mEq/L (ref 96–112)
Creatinine, Ser: 1.59 mg/dL — ABNORMAL HIGH (ref 0.40–1.50)
GFR: 44.69 mL/min — AB (ref >60.00)
Glucose, Bld: 175 mg/dL — ABNORMAL HIGH (ref 70–99)
Potassium: 5.3 mEq/L — ABNORMAL HIGH (ref 3.5–5.1)
Sodium: 138 mEq/L (ref 135–145)
TOTAL PROTEIN: 7.4 g/dL (ref 6.0–8.3)

## 2015-11-10 LAB — LIPID PANEL
CHOLESTEROL: 124 mg/dL (ref 0–200)
HDL: 43.1 mg/dL (ref >39.00)
LDL CALC: 62 mg/dL (ref 0–99)
NonHDL: 80.88
Total CHOL/HDL Ratio: 3
Triglycerides: 95 mg/dL (ref 0.0–149.0)
VLDL: 19 mg/dL (ref 0.0–40.0)

## 2015-11-10 LAB — LDL CHOLESTEROL, DIRECT: LDL DIRECT: 59 mg/dL

## 2015-11-10 LAB — MICROALBUMIN / CREATININE URINE RATIO
CREATININE, U: 88.4 mg/dL
MICROALB UR: 2.6 mg/dL — AB (ref 0.0–1.9)
Microalb Creat Ratio: 2.9 mg/g (ref 0.0–30.0)

## 2015-11-10 LAB — HEMOGLOBIN A1C: Hgb A1c MFr Bld: 8.1 % — ABNORMAL HIGH (ref 4.6–6.5)

## 2015-11-13 ENCOUNTER — Encounter: Payer: Self-pay | Admitting: Internal Medicine

## 2015-11-13 ENCOUNTER — Ambulatory Visit (INDEPENDENT_AMBULATORY_CARE_PROVIDER_SITE_OTHER): Payer: Medicare Other | Admitting: Internal Medicine

## 2015-11-13 VITALS — BP 120/64 | HR 93 | Temp 98.4°F | Resp 12 | Ht 70.0 in | Wt 196.5 lb

## 2015-11-13 DIAGNOSIS — E0821 Diabetes mellitus due to underlying condition with diabetic nephropathy: Secondary | ICD-10-CM

## 2015-11-13 DIAGNOSIS — M1732 Unilateral post-traumatic osteoarthritis, left knee: Secondary | ICD-10-CM

## 2015-11-13 DIAGNOSIS — I63239 Cerebral infarction due to unspecified occlusion or stenosis of unspecified carotid arteries: Secondary | ICD-10-CM | POA: Diagnosis not present

## 2015-11-13 DIAGNOSIS — E1151 Type 2 diabetes mellitus with diabetic peripheral angiopathy without gangrene: Secondary | ICD-10-CM | POA: Diagnosis not present

## 2015-11-13 DIAGNOSIS — E0865 Diabetes mellitus due to underlying condition with hyperglycemia: Secondary | ICD-10-CM

## 2015-11-13 DIAGNOSIS — E0841 Diabetes mellitus due to underlying condition with diabetic mononeuropathy: Secondary | ICD-10-CM | POA: Diagnosis not present

## 2015-11-13 DIAGNOSIS — E1165 Type 2 diabetes mellitus with hyperglycemia: Secondary | ICD-10-CM

## 2015-11-13 DIAGNOSIS — IMO0002 Reserved for concepts with insufficient information to code with codable children: Secondary | ICD-10-CM

## 2015-11-13 MED ORDER — HYDROCODONE-ACETAMINOPHEN 5-325 MG PO TABS: 1.0000 | ORAL_TABLET | Freq: Four times a day (QID) | ORAL | Status: DC | PRN

## 2015-11-13 MED ORDER — DICLOFENAC SODIUM 1 % TD GEL: 2.0000 g | Freq: Four times a day (QID) | TRANSDERMAL | Status: DC

## 2015-11-13 NOTE — Progress Notes (Signed)
Subjective:  Patient ID: Henry Lindsey, male    DOB: 04-08-1933  Age: 82 y.o. MRN: [id number]  CC: The primary encounter diagnosis was Diabetic mononeuropathy associated with diabetes mellitus due to underlying condition ([place]). Diagnoses of Diabetes mellitus due to underlying condition, uncontrolled, with diabetic nephropathy, without long-term current use of insulin ([place]), Type 2 diabetes, uncontrolled, with peripheral circulatory disorder ([place]), Carotid artery stenosis with cerebral infarction ([place]), and Post-traumatic osteoarthritis of left knee were also pertinent to this visit.  HPI [name] presents for follow up on uncontrolled DM Type 2 and other chronic conditions.   Had right knee and right hip steroid injection 3 weeks ago by Dr Margaretmary Eddy, minimal relief of pain .Requesting refill on Vicodin.  Wants to try diclofenac gel for joint s  Right knee   Atherosclerosis:   Saw Dew for carotid ultrasound   Both arms going numb at night L >> R not every night and not during he day .  Has neuropathy in feet . Denies neck pain .  Takes gabapentin  Tid  Treating for sinus congestion with nasal spray .  Clear drainage  .  No cough except in the AM  Not sleeping well .  Benadryl not tolerated previously..  Melatonin discussed   Sugars:  High 178 fasting yesterday  130s at 5 pm .  Taking metformin  glipizde and januvia no insulin l since June.  eats ice cream every night .  [name] is an apple and [employer] cereal with blueberries   Lab Results  Component Value Date   HGBA1C 8.1* 11/10/2015     Outpatient Prescriptions Prior to Visit  Medication Sig Dispense Refill  . Alum & Mag Hydroxide-Simeth (MAGIC MOUTHWASH) SOLN Take 5 mLs by mouth 3 (three) times daily as needed for mouth pain.    [name] amLODipine (NORVASC) 10 MG tablet TAKE 1 TABLET BY MOUTH EVERY DAY 30 tablet 6  . aspirin EC 81 MG tablet Take 1 tablet (81 mg total) by mouth daily. 90 tablet 3  . atorvastatin (LIPITOR) 40 MG  tablet TAKE 1 TABLET BY MOUTH EVERY DAY 90 tablet 1  . Blood Glucose Monitoring Suppl (ONE TOUCH ULTRA SYSTEM KIT) w/Device KIT 1 kit by Does not apply route once. Use DX code E11.59 1 each 0  . cilostazol (PLETAL) 100 MG tablet Take 1 tablet (100 mg total) by mouth 2 (two) times daily. 60 tablet 2  . doxycycline (VIBRAMYCIN) 100 MG capsule Take 1 capsule (100 mg total) by mouth 2 (two) times daily. 6 capsule 0  . famotidine (PEPCID) 20 MG tablet TAKE 1 TABLET(20 MG) BY MOUTH TWICE DAILY 60 tablet 0  . fenofibrate micronized (LOFIBRA) 134 MG capsule TAKE 1 CAPSULE BY MOUTH EVERY MORNING BEFORE BREAKFAST 30 capsule 2  . finasteride (PROSCAR) 5 MG tablet Take 1 tablet (5 mg total) by mouth daily. 30 tablet 2  . gabapentin (NEURONTIN) 300 MG capsule TAKE 2 CAPSULES(600 MG) BY MOUTH THREE TIMES DAILY 180 capsule 0  . glipiZIDE (GLUCOTROL) 10 MG tablet Take 1 tablet (10 mg total) by mouth 2 (two) times daily. 60 tablet 6  . glucose blood test strip Use as instructed three times daily 100 each 12  . INSULIN SYRINGE .5CC/29G 29G X 1/2" 0.5 ML MISC 1 Syringe by Does not apply route 3 (three) times daily. 100 each 6  . Insulin Syringe-Needle U-100 (INSULIN SYRINGE .5CC/31GX5/16") 31G X 5/16" 0.5 ML MISC USE THREE TIMES DAILY 100 each 5  .  losartan (COZAAR) 100 MG tablet TAKE 1 TABLET BY MOUTH EVERY DAY 30 tablet 0  . metFORMIN (GLUCOPHAGE) 1000 MG tablet TAKE 1 TABLET BY MOUTH TWICE DAILY 180 tablet 0  . metoprolol succinate (TOPROL-XL) 100 MG 24 hr tablet TAKE 1 TABLET BY MOUTH ONCE DAILY 30 tablet 5  . ONETOUCH DELICA LANCETS 50V MISC Use three times daily to check blood sugar. 100 each 11  . sitaGLIPtin (JANUVIA) 100 MG tablet Take 1 tablet (100 mg total) by mouth daily. NOTE DOSE INCREASE. 30 tablet 5  . tamsulosin (FLOMAX) 0.4 MG CAPS capsule Take 0.4 mg by mouth daily after breakfast.    . traMADol (ULTRAM) 50 MG tablet     . diclofenac sodium (VOLTAREN) 1 % GEL Apply 2 g topically 4 (four) times  daily. 100 g 1  . HYDROcodone-acetaminophen (NORCO/VICODIN) 5-325 MG tablet Take 1 tablet by mouth every 6 (six) hours as needed. 90 tablet 0  . doxycycline (VIBRAMYCIN) 100 MG capsule Take 1 capsule (100 mg total) by mouth 2 (two) times daily. 14 capsule 0   No facility-administered medications prior to visit.    Review of Systems;  Patient denies headache, fevers, malaise, unintentional weight loss, skin rash, eye pain, sinus congestion and sinus pain, sore throat, dysphagia,  hemoptysis , cough, dyspnea, wheezing, chest pain, palpitations, orthopnea, edema, abdominal pain, nausea, melena, diarrhea, constipation, flank pain, dysuria, hematuria, urinary  Frequency, nocturia, numbness, tingling, seizures,  Focal weakness, Loss of consciousness,  Tremor, insomnia, depression, anxiety, and suicidal ideation.      Objective:  BP 120/64 mmHg  Pulse 93  Temp(Src) 98.4 F (36.9 C) (Oral)  Resp 12  Ht '5\' 10"'  (1.778 m)  Wt 196 lb 8 oz (89.132 kg)  BMI 28.19 kg/m2  SpO2 92%  BP Readings from Last 3 Encounters:  11/13/15 120/64  09/08/15 106/60  08/07/15 128/74    Wt Readings from Last 3 Encounters:  11/13/15 196 lb 8 oz (89.132 kg)  09/08/15 198 lb 12.8 oz (90.175 kg)  08/07/15 200 lb (90.719 kg)    General appearance: alert, cooperative and appears stated age Ears: normal TM's and external ear canals both ears Throat: lips, mucosa, and tongue normal; teeth and gums normal Neck: no adenopathy, no carotid bruit, supple, symmetrical, trachea midline and thyroid not enlarged, symmetric, no tenderness/mass/nodules Back: symmetric, no curvature. ROM normal. No CVA tenderness. Lungs: clear to auscultation bilaterally Heart: regular rate and rhythm, S1, S2 normal, no murmur, click, rub or gallop Abdomen: soft, non-tender; bowel sounds normal; no masses,  no organomegaly Pulses: 2+ and symmetric Skin: Skin color, texture, turgor normal. No rashes or lesions Lymph nodes: Cervical,  supraclavicular, and axillary nodes normal.  Lab Results  Component Value Date   HGBA1C 8.1* 11/10/2015   HGBA1C 8.6* 08/07/2015   HGBA1C 7.7* 05/04/2015    Lab Results  Component Value Date   CREATININE 1.59* 11/10/2015   CREATININE 1.32 08/07/2015   CREATININE 1.59* 05/18/2015    Lab Results  Component Value Date   WBC 6.9 07/29/2013   HGB 15.1 07/29/2013   HCT 46 07/29/2013   PLT 213 07/29/2013   GLUCOSE 175* 11/10/2015   CHOL 124 11/10/2015   TRIG 95.0 11/10/2015   HDL 43.10 11/10/2015   LDLDIRECT 59.0 11/10/2015   LDLCALC 62 11/10/2015   ALT 22 11/10/2015   AST 21 11/10/2015   NA 138 11/10/2015   K 5.3* 11/10/2015   CL 100 11/10/2015   CREATININE 1.59* 11/10/2015   BUN 27* 11/10/2015  CO2 29 11/10/2015   TSH 2.50 05/04/2015   PSA 0.83 10/10/2014   INR 1.0 08/17/2012   HGBA1C 8.1* 11/10/2015   MICROALBUR 2.6* 11/10/2015    Dg Chest 2 View  08/08/2015  CLINICAL DATA:  Productive cough, recurrent. EXAM: CHEST  2 VIEW COMPARISON:  04/08/2012 FINDINGS: There is no edema, consolidation, effusion, or pneumothorax. Ovoid density overlapping the apical heart in the lateral projection is chronic and osseous appearing. Diffuse bridging thoracic spine osteophytes or syndesmophytes, chronic. Normal heart size and aortic contours. Remote right acromioclavicular separation with chronic offset. IMPRESSION: No evidence of active disease or change since 2013. Electronically Signed   By: Monte Fantasia M.D.   On: 08/08/2015 08:32    Assessment & Plan:   Problem List Items Addressed This Visit    Left knee DJD    No improvement with injections .  vicodin refilled       Relevant Medications   HYDROcodone-acetaminophen (NORCO/VICODIN) 5-325 MG tablet   Type 2 diabetes, uncontrolled, with peripheral circulatory disorder ([place])    His recent evaluation by Dr Lucky Cowboy resulted in prescription of cilastozol which he stopped after two months when his leg pain resolved.  He  misunderstood Dr. Bunnie Domino instructions. He has resumed the medication and has had follow up with Dew every 3 months.         Carotid artery stenosis with cerebral infarction [facility])    He has had follow up with Dr Lucky Cowboy for carotid ultrasound      Uncontrolled diabetes mellitus with diabetic nephropathy, without long-term current use of insulin ([place])    Dietary advice given.  Patient refuses to take insulin  , he is maxed out on metformin, glipizide and Januvia. His  A1c has improved to 8.1 .  Neuropathy symptoms noted,  Foot exam done. Advised to reduce the sugar in his diet or go on insulin.   Lab Results  Component Value Date   HGBA1C 8.1* 11/10/2015    Lab Results  Component Value Date   MICROALBUR 2.6* 11/10/2015         Diabetic neuropathy ([place]) - Primary      I am having Mr. Isidro maintain his INSULIN SYRINGE .5CC/29G, INSULIN SYRINGE .5CC/31GX5/16", metoprolol succinate, aspirin EC, traMADol, tamsulosin, magic mouthwash, finasteride, atorvastatin, glipiZIDE, doxycycline, sitaGLIPtin, amLODipine, metFORMIN, cilostazol, fenofibrate micronized, losartan, ONE TOUCH ULTRA SYSTEM KIT, glucose blood, ONETOUCH DELICA LANCETS 29W, famotidine, gabapentin, latanoprost, diclofenac sodium, and HYDROcodone-acetaminophen.  Meds ordered this encounter  Medications  . latanoprost (XALATAN) 0.005 % ophthalmic solution    Sig: INSTILL 1 DROP INTO BOTH EYES QD    Refill:  5  . diclofenac sodium (VOLTAREN) 1 % GEL    Sig: Apply 2 g topically 4 (four) times daily.    Dispense:  100 g    Refill:  1  . DISCONTD: HYDROcodone-acetaminophen (NORCO/VICODIN) 5-325 MG tablet    Sig: Take 1 tablet by mouth every 6 (six) hours as needed.    Dispense:  90 tablet    Refill:  0    May Refill on or after Nov 22 2015  Max 3 daily  . DISCONTD: HYDROcodone-acetaminophen (NORCO/VICODIN) 5-325 MG tablet    Sig: Take 1 tablet by mouth every 6 (six) hours as needed.    Dispense:  90 tablet    Refill:  0    May  Refill on or after December 20 2015  Max 3 daily  . HYDROcodone-acetaminophen (NORCO/VICODIN) 5-325 MG tablet    Sig: Take 1 tablet  by mouth every 6 (six) hours as needed.    Dispense:  90 tablet    Refill:  0    May Refill on or after January 20 2016  Max 3 daily    Medications Discontinued During This Encounter  Medication Reason  . doxycycline (VIBRAMYCIN) 100 MG capsule Completed Course  . diclofenac sodium (VOLTAREN) 1 % GEL Reorder  . HYDROcodone-acetaminophen (NORCO/VICODIN) 5-325 MG tablet Reorder  . HYDROcodone-acetaminophen (NORCO/VICODIN) 5-325 MG tablet Reorder  . HYDROcodone-acetaminophen (NORCO/VICODIN) 5-325 MG tablet Reorder    Follow-up: Return in about 3 months (around 02/11/2016) for follow up diabetes.   [name], MD

## 2015-11-13 NOTE — Patient Instructions (Addendum)
You need to be eating  less sugar or you will need to back on insulin .  This means less ice cream    Try the [employer] Triple Zero in salted caramel .  It is delicious topped with whipped cream !  Also the [employer] [place] yougurt has a delicious [place] pie falvor that is also great with whipped cream   Your numbness is due  To your diabetes. [name]

## 2015-11-13 NOTE — Progress Notes (Signed)
Pre-visit discussion using our clinic review tool. No additional management support is needed unless otherwise documented below in the visit note.  

## 2015-11-14 NOTE — Assessment & Plan Note (Signed)
He has had follow up with Dr Lucky Cowboy for carotid ultrasound

## 2015-11-14 NOTE — Assessment & Plan Note (Signed)
No improvement with injections .  vicodin refilled

## 2015-11-14 NOTE — Assessment & Plan Note (Signed)
Dietary advice given.  Patient refuses to take insulin  , he is maxed out on metformin, glipizide and Januvia. His  A1c has improved to 8.1 .  Neuropathy symptoms noted,  Foot exam done. Advised to reduce the sugar in his diet or go on insulin.   Lab Results  Component Value Date   HGBA1C 8.1* 11/10/2015    Lab Results  Component Value Date   MICROALBUR 2.6* 11/10/2015

## 2015-11-14 NOTE — Assessment & Plan Note (Signed)
His recent evaluation by Dr Lucky Cowboy resulted in prescription of cilastozol which he stopped after two months when his leg pain resolved.  He misunderstood Dr. Bunnie Domino instructions. He has resumed the medication and has had follow up with Dew every 3 months.

## 2015-11-20 ENCOUNTER — Other Ambulatory Visit: Payer: Self-pay | Admitting: Internal Medicine

## 2015-12-11 ENCOUNTER — Other Ambulatory Visit: Payer: Self-pay | Admitting: Internal Medicine

## 2015-12-20 ENCOUNTER — Other Ambulatory Visit: Payer: Self-pay | Admitting: Internal Medicine

## 2016-01-08 ENCOUNTER — Other Ambulatory Visit: Payer: Self-pay | Admitting: Internal Medicine

## 2016-01-09 ENCOUNTER — Other Ambulatory Visit: Payer: Self-pay | Admitting: Internal Medicine

## 2016-01-29 ENCOUNTER — Encounter: Payer: Self-pay | Admitting: Cardiovascular Disease

## 2016-01-29 ENCOUNTER — Ambulatory Visit (INDEPENDENT_AMBULATORY_CARE_PROVIDER_SITE_OTHER): Payer: Medicare Other | Admitting: Cardiovascular Disease

## 2016-01-29 VITALS — BP 140/80 | HR 75 | Ht 69.0 in | Wt 195.5 lb

## 2016-01-29 DIAGNOSIS — R200 Anesthesia of skin: Secondary | ICD-10-CM

## 2016-01-29 DIAGNOSIS — R208 Other disturbances of skin sensation: Secondary | ICD-10-CM

## 2016-01-29 DIAGNOSIS — I10 Essential (primary) hypertension: Secondary | ICD-10-CM

## 2016-01-29 DIAGNOSIS — I2510 Atherosclerotic heart disease of native coronary artery without angina pectoris: Secondary | ICD-10-CM

## 2016-01-29 DIAGNOSIS — E785 Hyperlipidemia, unspecified: Secondary | ICD-10-CM

## 2016-01-29 DIAGNOSIS — E1143 Type 2 diabetes mellitus with diabetic autonomic (poly)neuropathy: Secondary | ICD-10-CM

## 2016-01-29 DIAGNOSIS — IMO0002 Reserved for concepts with insufficient information to code with codable children: Secondary | ICD-10-CM

## 2016-01-29 DIAGNOSIS — E1151 Type 2 diabetes mellitus with diabetic peripheral angiopathy without gangrene: Secondary | ICD-10-CM

## 2016-01-29 DIAGNOSIS — E1165 Type 2 diabetes mellitus with hyperglycemia: Secondary | ICD-10-CM

## 2016-01-29 NOTE — Assessment & Plan Note (Signed)
Cholesterol is at goal on the current lipid regimen. No changes to the medications were made.  

## 2016-01-29 NOTE — Progress Notes (Signed)
Patient ID: Henry Lindsey, male    DOB: 01/03/1933, 82 y.o.   MRN: [id number]  HPI Comments: Henry Lindsey is a 82 y.o. male with a history of hyperlipidemia, diabetes, peripheral vascular disease with recent angioplasty of the distal right SFA by Dr.  Lucky Cowboy, 50% carotid disease bilaterally in 2012, history of poorly controlled diabetes with most recent hemoglobin A1c 8 ,  with history of stroke with minimal residual left sided deficits, patient of  Dr. Derrel Nip, history of abnormal EKG who presents for consultation of tingling in his hands. evaluated for abnormal EKG several years ago, stress test recommended that time. It appears he did not complete this test (order still open)  He reports having several months of tingling in his hands, worse at nighttime. Hands feel numb, symptoms seem to wax and wane, worse on some days, some days one hand, other days the other hand. Does not do any repetitive motion with his hand such as typing or lifting.  Very active at baseline, chops wood, does lots of activities around the house. Typically does not have any chest pain or shortness of breath on exertion.   Lab work reviewed with him showing creatinine 1.59, hemoglobin A1c 8.1, total cholesterol 124, LDL 59  EKG on today's visit shows normal sinus rhythm with rate 75 bpm, rare PVCs, couplets, unable to exclude old inferior MI  Other past medical history  chronic left knee pain.  History of claudication type symptoms have improved particularly on the right side after angioplasty .  Previous ultrasound showed bilateral tibial occlusive disease, residual 50% left SFA stenosis .   He does have a history of a right thalamic lacunar infarct in February 2012. Prior  carotid Dopplers noted less than 50% stenosis bilaterally.    No Known Allergies  Current Outpatient Prescriptions on File Prior to Visit  Medication Sig Dispense Refill  . Alum & Mag Hydroxide-Simeth (MAGIC MOUTHWASH) SOLN Take 5 mLs by mouth 3  (three) times daily as needed for mouth pain.    [name] amLODipine (NORVASC) 10 MG tablet TAKE 1 TABLET BY MOUTH EVERY DAY 30 tablet 6  . aspirin EC 81 MG tablet Take 1 tablet (81 mg total) by mouth daily. 90 tablet 3  . atorvastatin (LIPITOR) 40 MG tablet TAKE 1 TABLET BY MOUTH EVERY DAY 90 tablet 0  . Blood Glucose Monitoring Suppl (ONE TOUCH ULTRA SYSTEM KIT) w/Device KIT 1 kit by Does not apply route once. Use DX code E11.59 1 each 0  . cilostazol (PLETAL) 100 MG tablet TAKE 1 TABLET(100 MG) BY MOUTH TWICE DAILY 60 tablet 0  . diclofenac sodium (VOLTAREN) 1 % GEL Apply 2 g topically 4 (four) times daily. 100 g 1  . famotidine (PEPCID) 20 MG tablet TAKE 1 TABLET(20 MG) BY MOUTH TWICE DAILY 60 tablet 3  . fenofibrate micronized (LOFIBRA) 134 MG capsule TAKE 1 CAPSULE BY MOUTH EVERY MORNING BEFORE BREAKFAST 30 capsule 3  . finasteride (PROSCAR) 5 MG tablet Take 1 tablet (5 mg total) by mouth daily. 30 tablet 2  . gabapentin (NEURONTIN) 300 MG capsule TAKE 2 CAPSULES(600 MG) BY MOUTH THREE TIMES DAILY 180 capsule 0  . glipiZIDE (GLUCOTROL) 10 MG tablet Take 1 tablet (10 mg total) by mouth 2 (two) times daily. 60 tablet 6  . glucose blood test strip Use as instructed three times daily 100 each 12  . HYDROcodone-acetaminophen (NORCO/VICODIN) 5-325 MG tablet Take 1 tablet by mouth every 6 (six) hours as needed. 90 tablet  0  . INSULIN SYRINGE .5CC/29G 29G X 1/2" 0.5 ML MISC 1 Syringe by Does not apply route 3 (three) times daily. 100 each 6  . Insulin Syringe-Needle U-100 (INSULIN SYRINGE .5CC/31GX5/16") 31G X 5/16" 0.5 ML MISC USE THREE TIMES DAILY 100 each 5  . latanoprost (XALATAN) 0.005 % ophthalmic solution INSTILL 1 DROP INTO BOTH EYES QD  5  . losartan (COZAAR) 100 MG tablet TAKE 1 TABLET BY MOUTH EVERY DAY 30 tablet 3  . metFORMIN (GLUCOPHAGE) 1000 MG tablet TAKE 1 TABLET BY MOUTH TWICE DAILY 180 tablet 1  . metoprolol succinate (TOPROL-XL) 100 MG 24 hr tablet TAKE 1 TABLET BY MOUTH ONCE DAILY 30  tablet 5  . ONETOUCH DELICA LANCETS 79Y MISC Use three times daily to check blood sugar. 100 each 11  . sitaGLIPtin (JANUVIA) 100 MG tablet Take 1 tablet (100 mg total) by mouth daily. NOTE DOSE INCREASE. 30 tablet 5  . tamsulosin (FLOMAX) 0.4 MG CAPS capsule Take 0.4 mg by mouth daily after breakfast.    . traMADol (ULTRAM) 50 MG tablet      No current facility-administered medications on file prior to visit.    Past Medical History  Diagnosis Date  . Diabetes mellitus   . Neuromuscular disorder ([place])   . Hyperlipidemia   . Hypertension   . CVA (cerebral infarction) feb 2012    r thalamic lacunar  . Peripheral vascular disease in diabetes mellitus [facility]) Sept 2013     95% occlusion s/p PTCA R SFA Dew Sept 2013  . Carotid artery stenosis Feb 2012    <50% bilaterally    Past Surgical History  Procedure Laterality Date  . Upper gastrointestinal endoscopy    . Joint replacement Left 2014    left knee    Social History  reports that he quit smoking about 47 years ago. He has never used smokeless tobacco. He reports that he does not drink alcohol or use illicit drugs.  Family History family history includes Diabetes in his mother.   Review of Systems  Constitutional: Negative.   Eyes: Negative.   Respiratory: Negative.   Cardiovascular: Negative.   Gastrointestinal: Negative.   Musculoskeletal: Positive for arthralgias.  Neurological: Negative.        Numbness and tingling in his hands bilaterally  Psychiatric/Behavioral: Negative.   All other systems reviewed and are negative.   BP 140/80 mmHg  Pulse 75  Ht '5\' 9"'  (1.753 m)  Wt 195 lb 8 oz (88.678 kg)  BMI 28.86 kg/m2  Physical Exam  Constitutional: He is oriented to person, place, and time. He appears well-developed and well-nourished.  HENT:  Head: Normocephalic.  Nose: Nose normal.  Mouth/Throat: Oropharynx is clear and moist.  Eyes: Conjunctivae are normal. Pupils are equal, round, and reactive to light.   Neck: Normal range of motion. Neck supple. No JVD present. Carotid bruit is present.  Cardiovascular: Normal rate, regular rhythm, S1 normal, S2 normal, normal heart sounds and intact distal pulses.  Exam reveals no gallop and no friction rub.   No murmur heard. Pulses:      Carotid pulses are 1+ on the right side, and 1+ on the left side.      Radial pulses are 2+ on the right side, and 2+ on the left side.       Dorsalis pedis pulses are 0 on the right side, and 0 on the left side.       Posterior tibial pulses are 0 on the right side,  and 0 on the left side.  Pulmonary/Chest: Effort normal and breath sounds normal. No respiratory distress. He has no wheezes. He has no rales. He exhibits no tenderness.  Abdominal: Soft. Bowel sounds are normal. He exhibits no distension. There is no tenderness.  Musculoskeletal: Normal range of motion. He exhibits no edema or tenderness.  Lymphadenopathy:    He has no cervical adenopathy.  Neurological: He is alert and oriented to person, place, and time. Coordination normal.  Skin: Skin is warm and dry. No rash noted. No erythema.  Psychiatric: He has a normal mood and affect. His behavior is normal. Judgment and thought content normal.      Assessment and Plan   Nursing note and vitals reviewed.

## 2016-01-29 NOTE — Assessment & Plan Note (Signed)
Currently with no symptoms of angina. No further workup at this time. Continue current medication regimen. 

## 2016-01-29 NOTE — Patient Instructions (Signed)
You are doing well. No medication changes were made.  Please call us if you have new issues that need to be addressed before your next appt.  Your physician wants you to follow-up in: 12 months.  You will receive a reminder letter in the mail two months in advance. If you don't receive a letter, please call our office to schedule the follow-up appointment. 

## 2016-01-29 NOTE — Assessment & Plan Note (Addendum)
tingling in his hands at nighttime bilaterally Unable to exclude carpal tunnel He has good circulation, recommended he could try wrist splints, discussed with Dr. Derrel Nip   Total encounter time more than 45 minutes  Greater than 50% was spent in counseling and coordination of care with the patient

## 2016-01-29 NOTE — Assessment & Plan Note (Signed)
Blood pressure is well controlled on today's visit. No changes made to the medications. 

## 2016-01-29 NOTE — Assessment & Plan Note (Signed)
Hemoglobin A1c of 8 Long discussion concerning diet, need to decrease his carbohydrates, increase his walking/exercise as tolerated

## 2016-01-29 NOTE — Assessment & Plan Note (Signed)
Denies having significant claudication type pain PAD monitor by Dr. Lucky Cowboy Stressed the importance of aggressive diabetes control Cholesterol at goal

## 2016-02-05 ENCOUNTER — Other Ambulatory Visit: Payer: Self-pay | Admitting: Internal Medicine

## 2016-02-05 NOTE — Telephone Encounter (Signed)
refilled 01/09/16 and last seen on 11/13/15. Please advise?

## 2016-02-06 NOTE — Telephone Encounter (Signed)
Patient has a appointment on 02/12/16

## 2016-02-06 NOTE — Telephone Encounter (Signed)
Needs diabetes follow up scheduled.    Refilled .

## 2016-02-12 ENCOUNTER — Encounter: Payer: Self-pay | Admitting: Internal Medicine

## 2016-02-12 ENCOUNTER — Ambulatory Visit (INDEPENDENT_AMBULATORY_CARE_PROVIDER_SITE_OTHER): Payer: Medicare Other | Admitting: Internal Medicine

## 2016-02-12 VITALS — BP 120/76 | HR 96 | Temp 97.8°F | Resp 12 | Ht 69.0 in | Wt 196.8 lb

## 2016-02-12 DIAGNOSIS — E785 Hyperlipidemia, unspecified: Secondary | ICD-10-CM

## 2016-02-12 DIAGNOSIS — E1151 Type 2 diabetes mellitus with diabetic peripheral angiopathy without gangrene: Secondary | ICD-10-CM | POA: Diagnosis not present

## 2016-02-12 DIAGNOSIS — E0821 Diabetes mellitus due to underlying condition with diabetic nephropathy: Secondary | ICD-10-CM

## 2016-02-12 DIAGNOSIS — E1165 Type 2 diabetes mellitus with hyperglycemia: Secondary | ICD-10-CM

## 2016-02-12 DIAGNOSIS — I10 Essential (primary) hypertension: Secondary | ICD-10-CM

## 2016-02-12 DIAGNOSIS — E1142 Type 2 diabetes mellitus with diabetic polyneuropathy: Secondary | ICD-10-CM

## 2016-02-12 DIAGNOSIS — E0865 Diabetes mellitus due to underlying condition with hyperglycemia: Secondary | ICD-10-CM

## 2016-02-12 DIAGNOSIS — IMO0002 Reserved for concepts with insufficient information to code with codable children: Secondary | ICD-10-CM

## 2016-02-12 LAB — COMPREHENSIVE METABOLIC PANEL
ALBUMIN: 4.4 g/dL (ref 3.5–5.2)
ALT: 20 U/L (ref 0–53)
AST: 17 U/L (ref 0–37)
Alkaline Phosphatase: 37 U/L — ABNORMAL LOW (ref 39–117)
BUN: 19 mg/dL (ref 6–23)
CALCIUM: 9.4 mg/dL (ref 8.4–10.5)
CHLORIDE: 100 meq/L (ref 96–112)
CO2: 29 meq/L (ref 19–32)
Creatinine, Ser: 1.22 mg/dL (ref 0.40–1.50)
GFR: 60.63 mL/min (ref >60.00)
Glucose, Bld: 127 mg/dL — ABNORMAL HIGH (ref 70–99)
POTASSIUM: 4 meq/L (ref 3.5–5.1)
Sodium: 137 mEq/L (ref 135–145)
Total Bilirubin: 0.6 mg/dL (ref 0.2–1.2)
Total Protein: 7.2 g/dL (ref 6.0–8.3)

## 2016-02-12 LAB — HEMOGLOBIN A1C: HEMOGLOBIN A1C: 8.7 % — AB (ref 4.6–6.5)

## 2016-02-12 LAB — LIPID PANEL
CHOL/HDL RATIO: 3
CHOLESTEROL: 132 mg/dL (ref 0–200)
HDL: 47.5 mg/dL (ref >39.00)
LDL CALC: 59 mg/dL (ref 0–99)
NonHDL: 84.59
Triglycerides: 127 mg/dL (ref 0.0–149.0)
VLDL: 25.4 mg/dL (ref 0.0–40.0)

## 2016-02-12 LAB — LDL CHOLESTEROL, DIRECT: Direct LDL: 60 mg/dL

## 2016-02-12 LAB — MICROALBUMIN / CREATININE URINE RATIO
CREATININE, U: 102.4 mg/dL
MICROALB UR: 6.8 mg/dL — AB (ref 0.0–1.9)
Microalb Creat Ratio: 6.6 mg/g (ref 0.0–30.0)

## 2016-02-12 MED ORDER — HYDROCODONE-ACETAMINOPHEN 5-325 MG PO TABS: 1.0000 | ORAL_TABLET | Freq: Four times a day (QID) | ORAL | Status: DC | PRN

## 2016-02-12 MED ORDER — GABAPENTIN 400 MG PO CAPS: 400.0000 mg | ORAL_CAPSULE | Freq: Three times a day (TID) | ORAL | Status: DC

## 2016-02-12 MED ORDER — MELOXICAM 15 MG PO TABS: 15.0000 mg | ORAL_TABLET | Freq: Every day | ORAL | Status: DC

## 2016-02-12 NOTE — Patient Instructions (Addendum)
Stop the Voltaren and use meloxicam once daily  Continue aspercreme as directed on instructions  I have increased the gabapentin to 400 mg  Three times daily  If not helpful,  We can increase to 600 mg four times daily  Next month.  Let me know  If your a1c is > 8.0, we should resume insulin

## 2016-02-12 NOTE — Progress Notes (Signed)
Subjective:  Patient ID: Henry Lindsey, male    DOB: November 26, 1932  Age: 82 y.o. MRN: [id number]  CC: The primary encounter diagnosis was Type 2 diabetes, uncontrolled, with peripheral circulatory disorder ([place]). Diagnoses of Diabetes mellitus due to underlying condition, uncontrolled, with diabetic nephropathy, without long-term current use of insulin ([place]), Hyperlipidemia, Essential hypertension, and Diabetic polyneuropathy associated with type 2 diabetes mellitus ([place]) were also pertinent to this visit.  HPI Henry Lindsey presents for follow up on diabetes, hyperlipidemia, hypertension and neuropathy  Has not used insulin to control his diabetes since last July.  Blood sugars have been labile ,  Often 180 to 200.  He is taking Januvia , glipizide and metformin .  He has diabetic neuropathy and is taking gabapentin which is reducing the discomfort in his feet.  He is using  voltaren but $$$$  no better than aspercreme. Knees still bothering him.  Sees Orthopedics PA next month  Saw Eye doctor 3 weeks ago for glaucoma.    Outpatient Prescriptions Prior to Visit  Medication Sig Dispense Refill  . Alum & Mag Hydroxide-Simeth (MAGIC MOUTHWASH) SOLN Take 5 mLs by mouth 3 (three) times daily as needed for mouth pain.    [name] amLODipine (NORVASC) 10 MG tablet TAKE 1 TABLET BY MOUTH EVERY DAY 30 tablet 6  . aspirin EC 81 MG tablet Take 1 tablet (81 mg total) by mouth daily. 90 tablet 3  . atorvastatin (LIPITOR) 40 MG tablet TAKE 1 TABLET BY MOUTH EVERY DAY 90 tablet 0  . Blood Glucose Monitoring Suppl (ONE TOUCH ULTRA SYSTEM KIT) w/Device KIT 1 kit by Does not apply route once. Use DX code E11.59 1 each 0  . cilostazol (PLETAL) 100 MG tablet TAKE 1 TABLET(100 MG) BY MOUTH TWICE DAILY 60 tablet 5  . diclofenac sodium (VOLTAREN) 1 % GEL Apply 2 g topically 4 (four) times daily. 100 g 1  . famotidine (PEPCID) 20 MG tablet TAKE 1 TABLET(20 MG) BY MOUTH TWICE DAILY 60 tablet 3  . fenofibrate micronized  (LOFIBRA) 134 MG capsule TAKE 1 CAPSULE BY MOUTH EVERY MORNING BEFORE BREAKFAST 30 capsule 3  . finasteride (PROSCAR) 5 MG tablet Take 1 tablet (5 mg total) by mouth daily. 30 tablet 2  . glipiZIDE (GLUCOTROL) 10 MG tablet Take 1 tablet (10 mg total) by mouth 2 (two) times daily. 60 tablet 6  . glucose blood test strip Use as instructed three times daily 100 each 12  . JANUVIA 100 MG tablet TAKE 1 TABLET(100 MG) BY MOUTH DAILY. NOTE DOSE INCREASE 30 tablet 5  . latanoprost (XALATAN) 0.005 % ophthalmic solution INSTILL 1 DROP INTO BOTH EYES QD  5  . losartan (COZAAR) 100 MG tablet TAKE 1 TABLET BY MOUTH EVERY DAY 30 tablet 3  . metFORMIN (GLUCOPHAGE) 1000 MG tablet TAKE 1 TABLET BY MOUTH TWICE DAILY 180 tablet 1  . metoprolol succinate (TOPROL-XL) 100 MG 24 hr tablet TAKE 1 TABLET BY MOUTH ONCE DAILY 30 tablet 5  . ONETOUCH DELICA LANCETS 16L MISC Use three times daily to check blood sugar. 100 each 11  . tamsulosin (FLOMAX) 0.4 MG CAPS capsule Take 0.4 mg by mouth daily after breakfast.    . traMADol (ULTRAM) 50 MG tablet     . gabapentin (NEURONTIN) 300 MG capsule TAKE 2 CAPSULES(600 MG) BY MOUTH THREE TIMES DAILY 180 capsule 0  . HYDROcodone-acetaminophen (NORCO/VICODIN) 5-325 MG tablet Take 1 tablet by mouth every 6 (six) hours as needed. 90 tablet 0  .  INSULIN SYRINGE .5CC/29G 29G X 1/2" 0.5 ML MISC 1 Syringe by Does not apply route 3 (three) times daily. (Patient not taking: Reported on 02/12/2016) 100 each 6  . Insulin Syringe-Needle U-100 (INSULIN SYRINGE .5CC/31GX5/16") 31G X 5/16" 0.5 ML MISC USE THREE TIMES DAILY (Patient not taking: Reported on 02/12/2016) 100 each 5   No facility-administered medications prior to visit.    Review of Systems;  Patient denies headache, fevers, malaise, unintentional weight loss, skin rash, eye pain, sinus congestion and sinus pain, sore throat, dysphagia,  hemoptysis , cough, dyspnea, wheezing, chest pain, palpitations, orthopnea, edema, abdominal pain,  nausea, melena, diarrhea, constipation, flank pain, dysuria, hematuria, urinary  Frequency, nocturia, numbness, tingling, seizures,  Focal weakness, Loss of consciousness,  Tremor, insomnia, depression, anxiety, and suicidal ideation.      Objective:  BP 120/76 mmHg  Pulse 96  Temp(Src) 97.8 F (36.6 C) (Oral)  Resp 12  Ht _0  (1.753 m)  Wt 196 lb 12 oz (89.245 kg)  BMI 29.04 kg/m2  SpO2 98%  BP Readings from Last 3 Encounters:  02/12/16 120/76  01/29/16 140/80  11/13/15 120/64    Wt Readings from Last 3 Encounters:  02/12/16 196 lb 12 oz (89.245 kg)  01/29/16 195 lb 8 oz (88.678 kg)  11/13/15 196 lb 8 oz (89.132 kg)    General appearance: alert, cooperative and appears stated age Ears: normal TM's and external ear canals both ears Throat: lips, mucosa, and tongue normal; teeth and gums normal Neck: no adenopathy, no carotid bruit, supple, symmetrical, trachea midline and thyroid not enlarged, symmetric, no tenderness/mass/nodules Back: symmetric, no curvature. ROM normal. No CVA tenderness. Lungs: clear to auscultation bilaterally Heart: regular rate and rhythm, S1, S2 normal, no murmur, click, rub or gallop Abdomen: soft, non-tender; bowel sounds normal; no masses,  no organomegaly Pulses: 2+ and symmetric Skin: Skin color, texture, turgor normal. No rashes or lesions Lymph nodes: Cervical, supraclavicular, and axillary nodes normal.  Lab Results  Component Value Date   HGBA1C 8.7* 02/12/2016   HGBA1C 8.1* 11/10/2015   HGBA1C 8.6* 08/07/2015    Lab Results  Component Value Date   CREATININE 1.22 02/12/2016   CREATININE 1.59* 11/10/2015   CREATININE 1.32 08/07/2015    Lab Results  Component Value Date   WBC 6.9 07/29/2013   HGB 15.1 07/29/2013   HCT 46 07/29/2013   PLT 213 07/29/2013   GLUCOSE 127* 02/12/2016   CHOL 132 02/12/2016   TRIG 127.0 02/12/2016   HDL 47.50 02/12/2016   LDLDIRECT 60.0 02/12/2016   LDLCALC 59 02/12/2016   ALT 20 02/12/2016     AST 17 02/12/2016   NA 137 02/12/2016   K 4.0 02/12/2016   CL 100 02/12/2016   CREATININE 1.22 02/12/2016   BUN 19 02/12/2016   CO2 29 02/12/2016   TSH 2.50 05/04/2015   PSA 0.83 10/10/2014   INR 1.0 08/17/2012   HGBA1C 8.7* 02/12/2016   MICROALBUR 6.8* 02/12/2016    Dg Chest 2 View  08/08/2015  CLINICAL DATA:  Productive cough, recurrent. EXAM: CHEST  2 VIEW COMPARISON:  04/08/2012 FINDINGS: There is no edema, consolidation, effusion, or pneumothorax. Ovoid density overlapping the apical heart in the lateral projection is chronic and osseous appearing. Diffuse bridging thoracic spine osteophytes or syndesmophytes, chronic. Normal heart size and aortic contours. Remote right acromioclavicular separation with chronic offset. IMPRESSION: No evidence of active disease or change since 2013. Electronically Signed   By: Monte Fantasia M.D.   On: 08/08/2015 08:32  Assessment & Plan:   Problem List Items Addressed This Visit    Hyperlipidemia     LDL and triglycerides are at goal on current dose of atorvastatin. He has no side effects and liver enzymes are normal. No changes today   Lab Results  Component Value Date   CHOL 132 02/12/2016   HDL 47.50 02/12/2016   LDLCALC 59 02/12/2016   LDLDIRECT 60.0 02/12/2016   TRIG 127.0 02/12/2016   CHOLHDL 3 02/12/2016   Lab Results  Component Value Date   ALT 20 02/12/2016   AST 17 02/12/2016   ALKPHOS 37* 02/12/2016   BILITOT 0.6 02/12/2016                Relevant Orders   LDL cholesterol, direct (Completed)   Hypertension    Well controlled on current regimen. Renal function stable, no changes today.  Lab Results  Component Value Date   CREATININE 1.22 02/12/2016   Lab Results  Component Value Date   NA 137 02/12/2016   K 4.0 02/12/2016   CL 100 02/12/2016   CO2 29 02/12/2016         Uncontrolled diabetes mellitus with diabetic nephropathy, without long-term current use of insulin ([facility])    Uncontrolled .   Patient is reluctant  to take insulin  , he is maxed out on metformin, glipizide and Januvia. His  A1c has  Risen to 8.6.  Neuropathy symptoms noted,  Foot exam done. He will need to rstart taking 15 units of NPH insulin at night and return with fasting sugars so that NPH insulin dose can be amended .    Lab Results  Component Value Date   HGBA1C 8.7* 02/12/2016    Lab Results  Component Value Date   MICROALBUR 6.8* 02/12/2016             Relevant Medications   insulin NPH Human (NOVOLIN N) 100 UNIT/ML injection   Diabetic neuropathy ([facility])    INCREASING GABAPENTIN TO 400 MG TID.       Relevant Medications   insulin NPH Human (NOVOLIN N) 100 UNIT/ML injection   Type 2 diabetes, uncontrolled, with peripheral circulatory disorder ([facility]) - Primary   Relevant Medications   insulin NPH Human (NOVOLIN N) 100 UNIT/ML injection   Other Relevant Orders   Comprehensive metabolic panel (Completed)   Hemoglobin A1c (Completed)   Lipid panel (Completed)   Microalbumin / creatinine urine ratio (Completed)      I have changed Mr. Orlick gabapentin. I am also having him start on meloxicam, insulin NPH Human, and INS SYRINGE/NEEDLE 1CC/28G. Additionally, I am having him maintain his INSULIN SYRINGE .5CC/29G, INSULIN SYRINGE .5CC/31GX5/16", metoprolol succinate, aspirin EC, traMADol, tamsulosin, magic mouthwash, finasteride, glipiZIDE, amLODipine, ONE TOUCH ULTRA SYSTEM KIT, glucose blood, ONETOUCH DELICA LANCETS 17P, latanoprost, diclofenac sodium, losartan, metFORMIN, fenofibrate micronized, atorvastatin, famotidine, cilostazol, JANUVIA, and HYDROcodone-acetaminophen.  Meds ordered this encounter  Medications  . gabapentin (NEURONTIN) 400 MG capsule    Sig: Take 1 capsule (400 mg total) by mouth 3 (three) times daily.    Dispense:  120 capsule    Refill:  4  . meloxicam (MOBIC) 15 MG tablet    Sig: Take 1 tablet (15 mg total) by mouth daily.    Dispense:  30 tablet    Refill:  0  .  DISCONTD: HYDROcodone-acetaminophen (NORCO/VICODIN) 5-325 MG tablet    Sig: Take 1 tablet by mouth every 6 (six) hours as needed.    Dispense:  90 tablet  Refill:  0    May Refill on or after Feb 19 2016  Max 3 daily  . DISCONTD: HYDROcodone-acetaminophen (NORCO/VICODIN) 5-325 MG tablet    Sig: Take 1 tablet by mouth every 6 (six) hours as needed.    Dispense:  90 tablet    Refill:  0    May Refill on or after March 21 2016  Max 3 daily  . HYDROcodone-acetaminophen (NORCO/VICODIN) 5-325 MG tablet    Sig: Take 1 tablet by mouth every 6 (six) hours as needed.    Dispense:  90 tablet    Refill:  0    May Refill on or after April 20 2016  Max 3 daily  . insulin NPH Human (NOVOLIN N) 100 UNIT/ML injection    Sig: Inject 0.15 mLs (15 Units total) into the skin at bedtime.    Dispense:  10 mL    Refill:  11  . INS SYRINGE/NEEDLE 1CC/28G (B-D INSULIN SYRINGE 1CC/28G) 28G X 1/2" 1 ML MISC    Sig: USE TO ADMINISTER INSULIN DAILY    Dispense:  100 each    Refill:  0    Medications Discontinued During This Encounter  Medication Reason  . gabapentin (NEURONTIN) 300 MG capsule Reorder  . HYDROcodone-acetaminophen (NORCO/VICODIN) 5-325 MG tablet Reorder  . HYDROcodone-acetaminophen (NORCO/VICODIN) 5-325 MG tablet Reorder  . HYDROcodone-acetaminophen (NORCO/VICODIN) 5-325 MG tablet Reorder    Follow-up: Return in about 3 months (around 05/13/2016) for follow up diabetes.   [name], MD

## 2016-02-12 NOTE — Progress Notes (Signed)
Pre-visit discussion using our clinic review tool. No additional management support is needed unless otherwise documented below in the visit note.  

## 2016-02-13 MED ORDER — INSULIN NPH (HUMAN) (ISOPHANE) 100 UNIT/ML ~~LOC~~ SUSP: 15.0000 [IU] | Freq: Every day | SUBCUTANEOUS | Status: DC

## 2016-02-13 MED ORDER — "INSULIN SYRINGE/NEEDLE 28G X 1/2"" 1 ML MISC": Status: DC

## 2016-02-13 NOTE — Assessment & Plan Note (Signed)
INCREASING GABAPENTIN TO 400 MG TID.

## 2016-02-13 NOTE — Assessment & Plan Note (Signed)
LDL and triglycerides are at goal on current dose of atorvastatin. He has no side effects and liver enzymes are normal. No changes today   Lab Results  Component Value Date   CHOL 132 02/12/2016   HDL 47.50 02/12/2016   LDLCALC 59 02/12/2016   LDLDIRECT 60.0 02/12/2016   TRIG 127.0 02/12/2016   CHOLHDL 3 02/12/2016   Lab Results  Component Value Date   ALT 20 02/12/2016   AST 17 02/12/2016   ALKPHOS 37* 02/12/2016   BILITOT 0.6 02/12/2016

## 2016-02-13 NOTE — Assessment & Plan Note (Signed)
Well controlled on current regimen. Renal function stable, no changes today.  Lab Results  Component Value Date   CREATININE 1.22 02/12/2016   Lab Results  Component Value Date   NA 137 02/12/2016   K 4.0 02/12/2016   CL 100 02/12/2016   CO2 29 02/12/2016

## 2016-02-13 NOTE — Assessment & Plan Note (Addendum)
Uncontrolled .  Patient is reluctant  to take insulin  , he is maxed out on metformin, glipizide and Januvia. His  A1c has  Risen to 8.6.  Neuropathy symptoms noted,  Foot exam done. He will need to rstart taking 15 units of NPH insulin at night and return with fasting sugars so that NPH insulin dose can be amended .    Lab Results  Component Value Date   HGBA1C 8.7* 02/12/2016    Lab Results  Component Value Date   MICROALBUR 6.8* 02/12/2016

## 2016-02-16 ENCOUNTER — Telehealth: Payer: Self-pay

## 2016-02-16 NOTE — Telephone Encounter (Signed)
PA for Novolin Insulin completed on Cover my meds. Approved from 01/17/2016-02/15/2019.

## 2016-03-12 ENCOUNTER — Other Ambulatory Visit: Payer: Self-pay | Admitting: Internal Medicine

## 2016-03-12 NOTE — Telephone Encounter (Signed)
OK to refill Mobic.

## 2016-03-13 ENCOUNTER — Other Ambulatory Visit: Payer: Self-pay | Admitting: Internal Medicine

## 2016-03-13 NOTE — Telephone Encounter (Signed)
Ok top refill  meloxicam.

## 2016-03-14 NOTE — Telephone Encounter (Signed)
REFILLED

## 2016-03-15 NOTE — Telephone Encounter (Signed)
Yes, done.  

## 2016-03-26 ENCOUNTER — Other Ambulatory Visit: Payer: Self-pay | Admitting: Internal Medicine

## 2016-04-01 ENCOUNTER — Encounter: Payer: Self-pay | Admitting: Internal Medicine

## 2016-04-01 ENCOUNTER — Telehealth: Payer: Self-pay | Admitting: Internal Medicine

## 2016-04-01 ENCOUNTER — Ambulatory Visit (INDEPENDENT_AMBULATORY_CARE_PROVIDER_SITE_OTHER): Payer: Medicare Other | Admitting: Internal Medicine

## 2016-04-01 VITALS — BP 150/78 | HR 84 | Temp 97.9°F | Resp 12 | Ht 69.0 in | Wt 195.8 lb

## 2016-04-01 DIAGNOSIS — R1032 Left lower quadrant pain: Secondary | ICD-10-CM

## 2016-04-01 DIAGNOSIS — K529 Noninfective gastroenteritis and colitis, unspecified: Secondary | ICD-10-CM | POA: Diagnosis not present

## 2016-04-01 MED ORDER — CIPROFLOXACIN HCL 500 MG PO TABS: 500.0000 mg | ORAL_TABLET | Freq: Two times a day (BID) | ORAL | Status: DC

## 2016-04-01 MED ORDER — METRONIDAZOLE 500 MG PO TABS: 500.0000 mg | ORAL_TABLET | Freq: Three times a day (TID) | ORAL | Status: DC

## 2016-04-01 NOTE — Progress Notes (Signed)
Subjective:  Patient ID: Henry Lindsey, male    DOB: 1933-04-13  Age: 82 y.o. MRN: [id number]  CC: The primary encounter diagnosis was Colitis, acute. A diagnosis of LLQ pain was also pertinent to this visit.  HPI Henry Lindsey presents for 3 week history of LLQ pain with frequent diarrhea, made worse by eating .  Tried stopping different meds to see if it was medication related.  Reports having loose stools 5-7 daily for the past 3 weeks, often right after eating. LLQ pain is worse right before he has to defecate and improves  Transiently.  No recent antibiotics.  Had two sterid shots in right knee and right hip 1 month ago which caused  His BS to increase to 500 for a few days, but BS have been "normal" for the last 2 weeks. Appetite has been ok, no nausea,  But has immediate diarrhea post prandially   Reviewed his  CT done in 2013  And normal appendix, Diverticulosis noted  Diet reviewed.  He is Eating eggs, grits toast and coffee for breakfast one day ,  And the next day will eat apple and cereal with milk.  Was drinking a concocion of honey and cayenne pepper,  So he tried eliminiating this ,  Which did not change his diarrhea.  Has not tried suspending pepcid  Tried suspending his insulin.   Lab Results  Component Value Date   HGBA1C 8.7* 02/12/2016      Outpatient Prescriptions Prior to Visit  Medication Sig Dispense Refill  . Alum & Mag Hydroxide-Simeth (MAGIC MOUTHWASH) SOLN Take 5 mLs by mouth 3 (three) times daily as needed for mouth pain.    [name] amLODipine (NORVASC) 10 MG tablet TAKE 1 TABLET BY MOUTH EVERY DAY 30 tablet 4  . aspirin EC 81 MG tablet Take 1 tablet (81 mg total) by mouth daily. 90 tablet 3  . atorvastatin (LIPITOR) 40 MG tablet TAKE 1 TABLET BY MOUTH EVERY DAY 90 tablet 0  . Blood Glucose Monitoring Suppl (ONE TOUCH ULTRA SYSTEM KIT) w/Device KIT 1 kit by Does not apply route once. Use DX code E11.59 1 each 0  . cilostazol (PLETAL) 100 MG tablet TAKE 1 TABLET(100  MG) BY MOUTH TWICE DAILY 60 tablet 5  . diclofenac sodium (VOLTAREN) 1 % GEL Apply 2 g topically 4 (four) times daily. 100 g 1  . famotidine (PEPCID) 20 MG tablet TAKE 1 TABLET(20 MG) BY MOUTH TWICE DAILY 60 tablet 3  . fenofibrate micronized (LOFIBRA) 134 MG capsule TAKE 1 CAPSULE BY MOUTH EVERY MORNING BEFORE BREAKFAST 30 capsule 3  . finasteride (PROSCAR) 5 MG tablet Take 1 tablet (5 mg total) by mouth daily. 30 tablet 2  . gabapentin (NEURONTIN) 400 MG capsule Take 1 capsule (400 mg total) by mouth 3 (three) times daily. 120 capsule 4  . glipiZIDE (GLUCOTROL) 10 MG tablet Take 1 tablet (10 mg total) by mouth 2 (two) times daily. 60 tablet 6  . glipiZIDE (GLUCOTROL) 10 MG tablet TAKE 1 TABLET(10 MG) BY MOUTH TWICE DAILY 60 tablet 4  . glucose blood test strip Use as instructed three times daily 100 each 12  . HYDROcodone-acetaminophen (NORCO/VICODIN) 5-325 MG tablet Take 1 tablet by mouth every 6 (six) hours as needed. 90 tablet 0  . INS SYRINGE/NEEDLE 1CC/28G (B-D INSULIN SYRINGE 1CC/28G) 28G X 1/2" 1 ML MISC USE TO ADMINISTER INSULIN DAILY 100 each 0  . insulin NPH Human (NOVOLIN N) 100 UNIT/ML injection Inject 0.15 mLs (15  Units total) into the skin at bedtime. 10 mL 11  . INSULIN SYRINGE .5CC/29G 29G X 1/2" 0.5 ML MISC 1 Syringe by Does not apply route 3 (three) times daily. 100 each 6  . Insulin Syringe-Needle U-100 (INSULIN SYRINGE .5CC/31GX5/16") 31G X 5/16" 0.5 ML MISC USE THREE TIMES DAILY 100 each 5  . JANUVIA 100 MG tablet TAKE 1 TABLET(100 MG) BY MOUTH DAILY. NOTE DOSE INCREASE 30 tablet 5  . latanoprost (XALATAN) 0.005 % ophthalmic solution INSTILL 1 DROP INTO BOTH EYES QD  5  . losartan (COZAAR) 100 MG tablet TAKE 1 TABLET BY MOUTH EVERY DAY 30 tablet 4  . meloxicam (MOBIC) 15 MG tablet TAKE 1 TABLET(15 MG) BY MOUTH DAILY 30 tablet 3  . meloxicam (MOBIC) 15 MG tablet TAKE 1 TABLET(15 MG) BY MOUTH DAILY 30 tablet 5  . metFORMIN (GLUCOPHAGE) 1000 MG tablet TAKE 1 TABLET BY MOUTH  TWICE DAILY 180 tablet 1  . metoprolol succinate (TOPROL-XL) 100 MG 24 hr tablet TAKE 1 TABLET BY MOUTH ONCE DAILY 30 tablet 5  . ONETOUCH DELICA LANCETS 10R MISC Use three times daily to check blood sugar. 100 each 11  . tamsulosin (FLOMAX) 0.4 MG CAPS capsule Take 0.4 mg by mouth daily after breakfast.    . traMADol (ULTRAM) 50 MG tablet      No facility-administered medications prior to visit.    Review of Systems;  Patient denies headache, fevers, malaise, unintentional weight loss, skin rash, eye pain, sinus congestion and sinus pain, sore throat, dysphagia,  hemoptysis , cough, dyspnea, wheezing, chest pain, palpitations, orthopnea, edema, abdominal pain, nausea, melena, diarrhea, constipation, flank pain, dysuria, hematuria, urinary  Frequency, nocturia, numbness, tingling, seizures,  Focal weakness, Loss of consciousness,  Tremor, insomnia, depression, anxiety, and suicidal ideation.      Objective:  BP 150/78 mmHg  Pulse 84  Temp(Src) 97.9 F (36.6 C) (Oral)  Resp 12  Ht '5\' 9"'  (1.753 m)  Wt 195 lb 12 oz (88.792 kg)  BMI 28.89 kg/m2  SpO2 94%  BP Readings from Last 3 Encounters:  04/01/16 150/78  02/12/16 120/76  01/29/16 140/80    Wt Readings from Last 3 Encounters:  04/01/16 195 lb 12 oz (88.792 kg)  02/12/16 196 lb 12 oz (89.245 kg)  01/29/16 195 lb 8 oz (88.678 kg)    General appearance: alert, cooperative and appears stated age Back: symmetric, no curvature. ROM normal. No CVA tenderness. Lungs: clear to auscultation bilaterally Heart: regular rate and rhythm, S1, S2 normal, no murmur, click, rub or gallop Abdomen: soft, tender in LLQ without guarding or rebound.  bowel sounds normal; no masses,  no organomegaly Pulses: 2+ and symmetric Skin: Skin color, texture, turgor normal. No rashes or lesions Lymph nodes: Cervical, supraclavicular, and axillary nodes normal.  Lab Results  Component Value Date   HGBA1C 8.7* 02/12/2016   HGBA1C 8.1* 11/10/2015    HGBA1C 8.6* 08/07/2015    Lab Results  Component Value Date   CREATININE 1.22 02/12/2016   CREATININE 1.59* 11/10/2015   CREATININE 1.32 08/07/2015    Lab Results  Component Value Date   WBC 6.9 07/29/2013   HGB 15.1 07/29/2013   HCT 46 07/29/2013   PLT 213 07/29/2013   GLUCOSE 127* 02/12/2016   CHOL 132 02/12/2016   TRIG 127.0 02/12/2016   HDL 47.50 02/12/2016   LDLDIRECT 60.0 02/12/2016   LDLCALC 59 02/12/2016   ALT 20 02/12/2016   AST 17 02/12/2016   NA 137 02/12/2016   K  4.0 02/12/2016   CL 100 02/12/2016   CREATININE 1.22 02/12/2016   BUN 19 02/12/2016   CO2 29 02/12/2016   TSH 2.50 05/04/2015   PSA 0.83 10/10/2014   INR 1.0 08/17/2012   HGBA1C 8.7* 02/12/2016   MICROALBUR 6.8* 02/12/2016    Dg Chest 2 View  08/08/2015  CLINICAL DATA:  Productive cough, recurrent. EXAM: CHEST  2 VIEW COMPARISON:  04/08/2012 FINDINGS: There is no edema, consolidation, effusion, or pneumothorax. Ovoid density overlapping the apical heart in the lateral projection is chronic and osseous appearing. Diffuse bridging thoracic spine osteophytes or syndesmophytes, chronic. Normal heart size and aortic contours. Remote right acromioclavicular separation with chronic offset. IMPRESSION: No evidence of active disease or change since 2013. Electronically Signed   By: Monte Fantasia M.D.   On: 08/08/2015 08:32    Assessment & Plan:   Problem List Items Addressed This Visit    Colitis, acute - Primary    Checking stools studies,  Labs,  And modifying diet,  Suspend pepcid,  Start cipro and flagyl once stool has been collected,  CT abd/ pelvis to rule out suspected diverticulitis,       Relevant Orders   Clostridium difficile EIA   Ova and parasite examination   Stool culture   Sedimentation rate   C-reactive protein   CBC with Differential/Platelet   Comprehensive metabolic panel   CT Abdomen Pelvis W Contrast   LLQ pain   Relevant Orders   CT Abdomen Pelvis W Contrast      I  am having Mr. Bushnell start on ciprofloxacin. I am also having him maintain his INSULIN SYRINGE .5CC/29G, INSULIN SYRINGE .5CC/31GX5/16", metoprolol succinate, aspirin EC, traMADol, tamsulosin, magic mouthwash, finasteride, glipiZIDE, ONE TOUCH ULTRA SYSTEM KIT, glucose blood, ONETOUCH DELICA LANCETS 59M, latanoprost, diclofenac sodium, metFORMIN, fenofibrate micronized, atorvastatin, famotidine, cilostazol, JANUVIA, gabapentin, HYDROcodone-acetaminophen, insulin NPH Human, INS SYRINGE/NEEDLE 1CC/28G, meloxicam, meloxicam, amLODipine, glipiZIDE, losartan, and metroNIDAZOLE.  Meds ordered this encounter  Medications  . ciprofloxacin (CIPRO) 500 MG tablet    Sig: Take 1 tablet (500 mg total) by mouth 2 (two) times daily.    Dispense:  14 tablet    Refill:  0  . DISCONTD: metroNIDAZOLE (FLAGYL) 500 MG tablet    Sig: Take 1 tablet (500 mg total) by mouth 3 (three) times daily.    Dispense:  21 tablet    Refill:  0  . DISCONTD: metroNIDAZOLE (FLAGYL) 500 MG tablet    Sig: Take 1 tablet (500 mg total) by mouth 3 (three) times daily.    Dispense:  21 tablet    Refill:  0  . DISCONTD: metroNIDAZOLE (FLAGYL) 500 MG tablet    Sig: Take 1 tablet (500 mg total) by mouth 3 (three) times daily.    Dispense:  21 tablet    Refill:  0  . metroNIDAZOLE (FLAGYL) 500 MG tablet    Sig: Take 1 tablet (500 mg total) by mouth 3 (three) times daily.    Dispense:  21 tablet    Refill:  0    Medications Discontinued During This Encounter  Medication Reason  . metroNIDAZOLE (FLAGYL) 500 MG tablet Reorder  . metroNIDAZOLE (FLAGYL) 500 MG tablet Reorder  . metroNIDAZOLE (FLAGYL) 500 MG tablet Reorder    Follow-up: No Follow-up on file.   [name], MD

## 2016-04-01 NOTE — Telephone Encounter (Signed)
Patient states that his diarrhea is brownish and a little darker than usual.  It was watery until he used OTC and now it is more loose BM.  There have been no new or changes to his medication or diet.

## 2016-04-01 NOTE — Patient Instructions (Addendum)
Suspend your pepcid to see if the diarrhea  Improves  Please bring [place] back the stool samples .  We need these to look for certain infections\  This may be diverticulitis so I am ordering a CT of your abdomen and pelvis.   I am starting you on ciprofloxacin and metronidazole .  These are antibiotics  Do NOT start THE ANTIBIOTICS until you have submitted the stool samples and do not drink alcohol until you have finished the antibiotics or you will get [place] to Help Relieve Diarrhea, Adult When you have diarrhea, the foods you eat and your eating habits are very important. Choosing the right foods and drinks can help relieve diarrhea. Also, because diarrhea can last up to 7 days, you need to replace lost fluids and electrolytes (such as sodium, potassium, and chloride) in order to help prevent dehydration.  WHAT GENERAL GUIDELINES DO I NEED TO FOLLOW?  Slowly drink 1 cup (8 oz) of fluid for each episode of diarrhea. If you are getting enough fluid, your urine will be clear or pale yellow.  Eat starchy foods. Some good choices include white rice, white toast, pasta, low-fiber cereal, baked potatoes (without the skin), saltine crackers, and bagels.  Avoid large servings of any cooked vegetables.  Limit fruit to two servings per day. A serving is  cup or 1 small piece.  Choose foods with less than 2 g of fiber per serving.  Limit fats to less than 8 tsp (38 g) per day.  Avoid fried foods.  Eat foods that have probiotics in them. Probiotics can be found in certain dairy products.  Avoid foods and beverages that may increase the speed at which food moves through the stomach and intestines (gastrointestinal tract). Things to avoid include:  High-fiber foods, such as dried fruit, raw fruits and vegetables, nuts, seeds, and whole grain foods.  Spicy foods and high-fat foods.  Foods and beverages sweetened with high-fructose corn syrup, honey, or sugar alcohols such as  xylitol, sorbitol, and mannitol. WHAT FOODS ARE RECOMMENDED? Grains White rice. White, [place], or pita breads (fresh or toasted), including plain rolls, buns, or bagels. White pasta. Saltine, soda, or graham crackers. Pretzels. Low-fiber cereal. Cooked cereals made with water (such as cornmeal, farina, or cream cereals). Plain muffins. Matzo. Melba toast. Zwieback.  Vegetables Potatoes (without the skin). Strained tomato and vegetable juices. Most well-cooked and canned vegetables without seeds. Tender lettuce. Fruits Cooked or canned applesauce, apricots, cherries, fruit cocktail, grapefruit, peaches, pears, or plums. Fresh bananas, apples without skin, cherries, grapes, cantaloupe, grapefruit, peaches, oranges, or plums.  Meat and Other Protein Products Baked or boiled chicken. Eggs. Tofu. Fish. Seafood. Smooth peanut butter. Ground or well-cooked tender beef, ham, veal, lamb, pork, or poultry.  Dairy Plain yogurt, kefir, and unsweetened liquid yogurt. Lactose-free milk, buttermilk, or soy milk. Plain hard cheese. Beverages Sport drinks. Clear broths. Diluted fruit juices (except prune). Regular, caffeine-free sodas such as ginger ale. Water. Decaffeinated teas. Oral rehydration solutions. Sugar-free beverages not sweetened with sugar alcohols. Other Bouillon, broth, or soups made from recommended foods.  The items listed above may not be a complete list of recommended foods or beverages. Contact your dietitian for more options. WHAT FOODS ARE NOT RECOMMENDED? Grains Whole grain, whole wheat, bran, or rye breads, rolls, pastas, crackers, and cereals. Wild or brown rice. Cereals that contain more than 2 g of fiber per serving. Corn tortillas or taco shells. Cooked or dry oatmeal. Granola. Popcorn. Vegetables  Raw vegetables. Cabbage, broccoli, Brussels sprouts, artichokes, baked beans, beet greens, corn, kale, legumes, peas, sweet potatoes, and yams. Potato skins. Cooked spinach and  cabbage. Fruits Dried fruit, including raisins and dates. Raw fruits. Stewed or dried prunes. Fresh apples with skin, apricots, mangoes, pears, raspberries, and strawberries.  Meat and Other Protein Products Chunky peanut butter. Nuts and seeds. Beans and lentils. Berniece Salines.  Dairy High-fat cheeses. Milk, chocolate milk, and beverages made with milk, such as milk shakes. Cream. Ice cream. Sweets and Desserts Sweet rolls, doughnuts, and sweet breads. Pancakes and waffles. Fats and Oils Butter. Cream sauces. Margarine. Salad oils. Plain salad dressings. Olives. Avocados.  Beverages Caffeinated beverages (such as coffee, tea, soda, or energy drinks). Alcoholic beverages. Fruit juices with pulp. Prune juice. Soft drinks sweetened with high-fructose corn syrup or sugar alcohols. Other Coconut. Hot sauce. Chili powder. Mayonnaise. Gravy. Cream-based or milk-based soups.  The items listed above may not be a complete list of foods and beverages to avoid. Contact your dietitian for more information. WHAT SHOULD I DO IF I BECOME DEHYDRATED? Diarrhea can sometimes lead to dehydration. Signs of dehydration include dark urine and dry mouth and skin. If you think you are dehydrated, you should rehydrate with an oral rehydration solution. These solutions can be purchased at pharmacies, retail stores, or online.  Drink -1 cup (120-240 mL) of oral rehydration solution each time you have an episode of diarrhea. If drinking this amount makes your diarrhea worse, try drinking smaller amounts more often. For example, drink 1-3 tsp (5-15 mL) every 5-10 minutes.  A general rule for staying hydrated is to drink 1-2 L of fluid per day. Talk to your health care provider about the specific amount you should be drinking each day. Drink enough fluids to keep your urine clear or pale yellow.   This information is not intended to replace advice given to you by your health care provider. Make sure you discuss any questions you  have with your health care provider.   Document Released: 12/28/2003 Document Revised: 10/28/2014 Document Reviewed: 08/30/2013 Elsevier Interactive Patient Education [employer].

## 2016-04-01 NOTE — Telephone Encounter (Signed)
Patient coming in today at 6:30 pm.

## 2016-04-01 NOTE — Telephone Encounter (Signed)
Pt states that he has had diarrhea for going on 3wks now and has tried several things and would like something called in.. Afford any appt but pt declined.. Please advise pt

## 2016-04-01 NOTE — Telephone Encounter (Signed)
Patient is complaining of diarrhea for 3 weeks. He is having 6-7 loose bowel movements daily with left lower abdominal pain.  Patient has tried Pepto bismol and kaopectate.  Patient denies fever, nausea, or blood.  Please advise.

## 2016-04-01 NOTE — Telephone Encounter (Signed)
What color, watery or just loose. has he recently been on ABX, change in diet or medication?

## 2016-04-01 NOTE — Progress Notes (Signed)
Pre-visit discussion using our clinic review tool. No additional management support is needed unless otherwise documented below in the visit note.  

## 2016-04-02 ENCOUNTER — Other Ambulatory Visit (INDEPENDENT_AMBULATORY_CARE_PROVIDER_SITE_OTHER): Payer: Medicare Other

## 2016-04-02 ENCOUNTER — Other Ambulatory Visit: Payer: Self-pay | Admitting: Internal Medicine

## 2016-04-02 DIAGNOSIS — K529 Noninfective gastroenteritis and colitis, unspecified: Secondary | ICD-10-CM | POA: Insufficient documentation

## 2016-04-02 DIAGNOSIS — R1032 Left lower quadrant pain: Secondary | ICD-10-CM | POA: Insufficient documentation

## 2016-04-02 LAB — SEDIMENTATION RATE: Sed Rate: 16 mm/hr (ref 0–20)

## 2016-04-02 LAB — COMPREHENSIVE METABOLIC PANEL
ALBUMIN: 4.3 g/dL (ref 3.5–5.2)
ALT: 14 U/L (ref 0–53)
AST: 17 U/L (ref 0–37)
Alkaline Phosphatase: 35 U/L — ABNORMAL LOW (ref 39–117)
BILIRUBIN TOTAL: 0.3 mg/dL (ref 0.2–1.2)
BUN: 22 mg/dL (ref 6–23)
CALCIUM: 9.6 mg/dL (ref 8.4–10.5)
CO2: 25 mEq/L (ref 19–32)
CREATININE: 1.44 mg/dL (ref 0.40–1.50)
Chloride: 100 mEq/L (ref 96–112)
GFR: 50.05 mL/min — ABNORMAL LOW (ref >60.00)
Glucose, Bld: 167 mg/dL — ABNORMAL HIGH (ref 70–99)
Potassium: 5.1 mEq/L (ref 3.5–5.1)
Sodium: 137 mEq/L (ref 135–145)
Total Protein: 7.2 g/dL (ref 6.0–8.3)

## 2016-04-02 LAB — CBC WITH DIFFERENTIAL/PLATELET
BASOS ABS: 0 10*3/uL (ref 0.0–0.1)
BASOS PCT: 0.3 % (ref 0.0–3.0)
EOS ABS: 0.2 10*3/uL (ref 0.0–0.7)
Eosinophils Relative: 3 % (ref 0.0–5.0)
HEMATOCRIT: 38.5 % — AB (ref 39.0–52.0)
HEMOGLOBIN: 12.6 g/dL — AB (ref 13.0–17.0)
LYMPHS PCT: 25.2 % (ref 12.0–46.0)
Lymphs Abs: 1.8 10*3/uL (ref 0.7–4.0)
MCHC: 32.7 g/dL (ref 30.0–36.0)
MCV: 85.6 fl (ref 78.0–100.0)
MONOS PCT: 9.3 % (ref 3.0–12.0)
Monocytes Absolute: 0.7 10*3/uL (ref 0.1–1.0)
Neutro Abs: 4.5 10*3/uL (ref 1.4–7.7)
Neutrophils Relative %: 62.2 % (ref 43.0–77.0)
Platelets: 325 10*3/uL (ref 150.0–400.0)
RBC: 4.51 Mil/uL (ref 4.22–5.81)
RDW: 14.1 % (ref 11.5–15.5)
WBC: 7.2 10*3/uL (ref 4.0–10.5)

## 2016-04-02 LAB — C-REACTIVE PROTEIN: CRP: 0.9 mg/dL (ref 0.5–20.0)

## 2016-04-02 NOTE — Assessment & Plan Note (Signed)
Checking stools studies,  Labs,  And modifying diet,  Suspend pepcid,  Start cipro and flagyl once stool has been collected,  CT abd/ pelvis to rule out suspected diverticulitis,

## 2016-04-06 LAB — STOOL CULTURE

## 2016-04-10 ENCOUNTER — Ambulatory Visit
Admission: RE | Admit: 2016-04-10 | Discharge: 2016-04-10 | Disposition: A | Discharge status: Home / Self Care | Payer: Medicare Other | Source: Ambulatory Visit | Attending: Internal Medicine | Admitting: Internal Medicine

## 2016-04-10 DIAGNOSIS — M4316 Spondylolisthesis, lumbar region: Secondary | ICD-10-CM | POA: Insufficient documentation

## 2016-04-10 DIAGNOSIS — M5137 Other intervertebral disc degeneration, lumbosacral region: Secondary | ICD-10-CM | POA: Insufficient documentation

## 2016-04-10 DIAGNOSIS — K5730 Diverticulosis of large intestine without perforation or abscess without bleeding: Secondary | ICD-10-CM | POA: Diagnosis not present

## 2016-04-10 DIAGNOSIS — R1032 Left lower quadrant pain: Secondary | ICD-10-CM | POA: Insufficient documentation

## 2016-04-10 DIAGNOSIS — K529 Noninfective gastroenteritis and colitis, unspecified: Secondary | ICD-10-CM | POA: Diagnosis present

## 2016-04-10 DIAGNOSIS — M5136 Other intervertebral disc degeneration, lumbar region: Secondary | ICD-10-CM | POA: Diagnosis not present

## 2016-04-10 LAB — OVA AND PARASITE EXAMINATION: OP: NONE SEEN

## 2016-04-10 MED ORDER — IOPAMIDOL (ISOVUE-300) INJECTION 61%
100.0000 mL | Freq: Once | INTRAVENOUS | Status: AC | PRN
  Administered 2016-04-10: 100 mL via INTRAVENOUS

## 2016-04-15 ENCOUNTER — Telehealth: Payer: Self-pay | Admitting: Internal Medicine

## 2016-04-15 NOTE — Telephone Encounter (Signed)
Pt called about his scan results. Please call him.

## 2016-04-15 NOTE — Telephone Encounter (Signed)
Pt to be notified when released.  Verbalized understanding.

## 2016-04-16 ENCOUNTER — Other Ambulatory Visit: Payer: Medicare Other

## 2016-04-16 ENCOUNTER — Other Ambulatory Visit: Payer: Self-pay | Admitting: *Deleted

## 2016-04-16 ENCOUNTER — Other Ambulatory Visit: Payer: Self-pay | Admitting: Internal Medicine

## 2016-04-16 DIAGNOSIS — R197 Diarrhea, unspecified: Secondary | ICD-10-CM

## 2016-04-17 LAB — C. DIFFICILE GDH AND TOXIN A/B
C. DIFF TOXIN A/B: NOT DETECTED
C. DIFFICILE GDH: NOT DETECTED

## 2016-04-23 ENCOUNTER — Other Ambulatory Visit: Payer: Self-pay | Admitting: Internal Medicine

## 2016-05-02 ENCOUNTER — Telehealth: Payer: Self-pay | Admitting: Internal Medicine

## 2016-05-02 ENCOUNTER — Telehealth: Payer: Self-pay

## 2016-05-02 MED ORDER — INSULIN ASPART PROT & ASPART (70-30 MIX) 100 UNIT/ML ~~LOC~~ SUSP: SUBCUTANEOUS | Status: DC

## 2016-05-02 NOTE — Telephone Encounter (Signed)
Henry Lindsey, thanks

## 2016-05-02 NOTE — Telephone Encounter (Signed)
Pt called wanting Juliann Pulse to know that he has been off of insulin NPH Human (NOVOLIN N) 100 UNIT/ML injection His diarrhea has stopped he's been off of it for 2 weeks and his BM is normal. Sugar is 130,140 and 160 running and he is not taking any insulin right now.   Call pt @ [phone]. Thank you!

## 2016-05-02 NOTE — Telephone Encounter (Signed)
Patient notified and voiced understanding.

## 2016-05-02 NOTE — Telephone Encounter (Signed)
After stopping the Novolin N  For 2 weeks patient diarrhea has subsided completely patient stated on the Humalog 75/25 he did not have any diarrhea. Patient  would like to restart the Humalog MIix 75/25. Patient last stool culture has not resulted will check with lab. CBG's 130/140/ 160 fasting

## 2016-05-02 NOTE — Telephone Encounter (Signed)
PA for Novolog Mix 70/30 completed on cover my meds.

## 2016-05-02 NOTE — Telephone Encounter (Signed)
70/30 insulin rx'd.  10 units in the am and 6 units in the evening  BEFORE MEALS.

## 2016-05-03 ENCOUNTER — Telehealth: Payer: Self-pay | Admitting: Internal Medicine

## 2016-05-03 MED ORDER — GLUCOSE BLOOD VI STRP: ORAL_STRIP | Status: DC

## 2016-05-03 MED ORDER — INSULIN LISPRO PROT & LISPRO (75-25 MIX) 100 UNIT/ML ~~LOC~~ SUSP: SUBCUTANEOUS | Status: DC

## 2016-05-03 NOTE — Telephone Encounter (Signed)
Patient would like for the assistant to give him a call concerning insulin test strips.  He said it was too much information to give out over the phone, so would like a telephone call to him.

## 2016-05-03 NOTE — Telephone Encounter (Signed)
Cannot reach patient by phone and no answering machine or voicemail.

## 2016-05-03 NOTE — Telephone Encounter (Signed)
Patient notified and voiced understanding also advised patient I called in test strips to pharmacy. FYI

## 2016-05-03 NOTE — Telephone Encounter (Signed)
Patient cannot get the Novolog 70/30 insurance denied < patient took 10 units this morning of humalog 75/25 sugar dropped to 66 patient waited and hour to eat, patient ate breakfast and in 2 hours CBG was 169, Patient was advised to take 10unit 70/30 with breakfast and 6 units with dinner, patient would like to know if he can continue with the same dosage with the 75/25.

## 2016-05-03 NOTE — Telephone Encounter (Signed)
If he still has 75/25 he can use it but eat immediately after administering the insulin .  BS dropped bc he delayed eating. I will try to get the 75/25 refilled.  If it happens again.  Reduce morning dose to 8 units and evening to 5 units

## 2016-05-03 NOTE — Telephone Encounter (Signed)
PA denied, not on formulary, benefit exclusion.

## 2016-05-03 NOTE — Telephone Encounter (Signed)
Patient notified and voiced understanding.

## 2016-05-03 NOTE — Telephone Encounter (Signed)
Reviewed.  I am not sure why his sugar dropped.  He does not need to wait an hour to eat after he gives himself insulin.  Appears to be back up.  Would recommend continuing the instructions that he had been given.  Monitor sugars closely.  If continues to drop, will need to adjust dose.  Let us know if any problems.

## 2016-05-08 ENCOUNTER — Other Ambulatory Visit: Payer: Self-pay | Admitting: Internal Medicine

## 2016-05-21 ENCOUNTER — Telehealth: Payer: Self-pay | Admitting: Internal Medicine

## 2016-05-21 MED ORDER — HYDROCODONE-ACETAMINOPHEN 5-325 MG PO TABS: 1.0000 | ORAL_TABLET | Freq: Four times a day (QID) | ORAL | 0 refills | Status: DC | PRN

## 2016-05-21 NOTE — Telephone Encounter (Signed)
Pt called needing a refill for HYDROcodone-acetaminophen (NORCO/VICODIN) 5-325 MG tablet  Please call pt when it's ready @ [phone]. Thank you!

## 2016-05-21 NOTE — Telephone Encounter (Signed)
Last refill was 02/12/2016 #90, last OV was 04/01/16, please advise refill, thanks

## 2016-05-22 NOTE — Telephone Encounter (Signed)
Pt picked up prescription. He wanted to know why it was only written for a 30 day supply and not a 90 day supply. Please advise pt.  Thanks.

## 2016-05-22 NOTE — Telephone Encounter (Signed)
Notified patient and Rx at front to pick up, thanks

## 2016-05-22 NOTE — Telephone Encounter (Signed)
I am guessing due to the new law, he will need a follow up, thanks

## 2016-05-22 NOTE — Telephone Encounter (Signed)
Pt called to follow up on Rx.   Call pt @ [phone]. Thank you!

## 2016-05-23 NOTE — Telephone Encounter (Signed)
Dr. Derrel Nip, in the past he got 90days worth of meds, please advise if there is a rationale for why he didn't this time. thanks

## 2016-05-23 NOTE — Telephone Encounter (Signed)
Pt called to follow up on the other 2 Rx that add up to 90day supply. Thank you!

## 2016-05-24 NOTE — Telephone Encounter (Signed)
See below, and advise patient, thanks

## 2016-05-24 NOTE — Telephone Encounter (Signed)
You are correct.  The new law requires that I see him every 3 months to refill chronic narcotics.  He needs an appt in September

## 2016-06-03 ENCOUNTER — Other Ambulatory Visit: Payer: Self-pay | Admitting: Internal Medicine

## 2016-06-28 ENCOUNTER — Encounter: Payer: Self-pay | Admitting: Internal Medicine

## 2016-06-28 LAB — HM DIABETES EYE EXAM

## 2016-07-01 ENCOUNTER — Ambulatory Visit (INDEPENDENT_AMBULATORY_CARE_PROVIDER_SITE_OTHER): Payer: Medicare Other | Admitting: Internal Medicine

## 2016-07-01 ENCOUNTER — Encounter: Payer: Self-pay | Admitting: Internal Medicine

## 2016-07-01 ENCOUNTER — Encounter (INDEPENDENT_AMBULATORY_CARE_PROVIDER_SITE_OTHER): Payer: Self-pay

## 2016-07-01 VITALS — BP 156/60 | HR 79 | Temp 98.7°F | Resp 16 | Ht 71.0 in | Wt 197.0 lb

## 2016-07-01 DIAGNOSIS — E785 Hyperlipidemia, unspecified: Secondary | ICD-10-CM | POA: Diagnosis not present

## 2016-07-01 DIAGNOSIS — E119 Type 2 diabetes mellitus without complications: Secondary | ICD-10-CM

## 2016-07-01 DIAGNOSIS — IMO0002 Reserved for concepts with insufficient information to code with codable children: Secondary | ICD-10-CM

## 2016-07-01 DIAGNOSIS — F1190 Opioid use, unspecified, uncomplicated: Secondary | ICD-10-CM

## 2016-07-01 DIAGNOSIS — E1165 Type 2 diabetes mellitus with hyperglycemia: Secondary | ICD-10-CM

## 2016-07-01 DIAGNOSIS — M1732 Unilateral post-traumatic osteoarthritis, left knee: Secondary | ICD-10-CM

## 2016-07-01 DIAGNOSIS — E1151 Type 2 diabetes mellitus with diabetic peripheral angiopathy without gangrene: Secondary | ICD-10-CM

## 2016-07-01 DIAGNOSIS — M7071 Other bursitis of hip, right hip: Secondary | ICD-10-CM

## 2016-07-01 MED ORDER — HYDROCODONE-ACETAMINOPHEN 5-325 MG PO TABS: 1.0000 | ORAL_TABLET | Freq: Two times a day (BID) | ORAL | 0 refills | Status: DC | PRN

## 2016-07-01 MED ORDER — LOSARTAN POTASSIUM-HCTZ 100-25 MG PO TABS: 1.0000 | ORAL_TABLET | Freq: Every day | ORAL | 0 refills | Status: DC

## 2016-07-01 NOTE — Progress Notes (Signed)
Subjective:  Patient ID: Henry Lindsey, male    DOB: July 29, 1933  Age: 82 y.o. MRN: [id number]  CC: The primary encounter diagnosis was Diabetes mellitus without complication ([place]). Diagnoses of Hyperlipidemia, Post-traumatic osteoarthritis of left knee, Bursitis of hip, right, Narcotic drug use, and Type 2 diabetes, uncontrolled, with peripheral circulatory disorder [facility]) were also pertinent to this visit.  HPI EARON RIVEST presents for follow up on chronic  issues includig  Type 2 DM and chronic pain   Checking  BS once daily with a relatively  new One  Touch machine.    129  11:30 AM , 172  5 Pm  109 fasting .  Insulin dose 75/25  dose is 8 units at night and 6 units in the AM  Januvia is costing him $200 /month .  L  Lab Results  Component Value Date   HGBA1C 8.1 (H) 07/01/2016   Put of pain meds for 11 days Using copper braces on knees and helping  .  Had a steroid shot last month in right knee right hip ,  In the past it has Made sugars go up to 400 .  Using 3 vicodin daily for knee pain .   Outpatient Medications Prior to Visit  Medication Sig Dispense Refill  . Alum & Mag Hydroxide-Simeth (MAGIC MOUTHWASH) SOLN Take 5 mLs by mouth 3 (three) times daily as needed for mouth pain.    [name] amLODipine (NORVASC) 10 MG tablet TAKE 1 TABLET BY MOUTH EVERY DAY 30 tablet 4  . aspirin EC 81 MG tablet Take 1 tablet (81 mg total) by mouth daily. 90 tablet 3  . atorvastatin (LIPITOR) 40 MG tablet TAKE 1 TABLET BY MOUTH EVERY DAY 90 tablet 0  . Blood Glucose Monitoring Suppl (ONE TOUCH ULTRA SYSTEM KIT) w/Device KIT 1 kit by Does not apply route once. Use DX code E11.59 1 each 0  . cilostazol (PLETAL) 100 MG tablet TAKE 1 TABLET(100 MG) BY MOUTH TWICE DAILY 60 tablet 5  . famotidine (PEPCID) 20 MG tablet TAKE 1 TABLET(20 MG) BY MOUTH TWICE DAILY 60 tablet 5  . gabapentin (NEURONTIN) 400 MG capsule Take 1 capsule (400 mg total) by mouth 3 (three) times daily. 120 capsule 4  . glipiZIDE  (GLUCOTROL) 10 MG tablet TAKE 1 TABLET(10 MG) BY MOUTH TWICE DAILY 60 tablet 4  . glucose blood test strip Use as instructed three times daily 100 each 12  . INS SYRINGE/NEEDLE 1CC/28G (B-D INSULIN SYRINGE 1CC/28G) 28G X 1/2" 1 ML MISC USE TO ADMINISTER INSULIN DAILY 100 each 0  . insulin lispro protamine-lispro (HUMALOG MIX 75/25) (75-25) 100 UNIT/ML SUSP injection Inject 10 units right before  breakfast and 6 units before dinner 10 mL 5  . JANUVIA 100 MG tablet TAKE 1 TABLET(100 MG) BY MOUTH DAILY. NOTE DOSE INCREASE 30 tablet 5  . latanoprost (XALATAN) 0.005 % ophthalmic solution INSTILL 1 DROP INTO BOTH EYES QD  5  . losartan (COZAAR) 100 MG tablet TAKE 1 TABLET BY MOUTH EVERY DAY 30 tablet 4  . meloxicam (MOBIC) 15 MG tablet TAKE 1 TABLET(15 MG) BY MOUTH DAILY 30 tablet 5  . metFORMIN (GLUCOPHAGE) 1000 MG tablet TAKE 1 TABLET BY MOUTH TWICE DAILY 180 tablet 0  . metoprolol succinate (TOPROL-XL) 100 MG 24 hr tablet TAKE 1 TABLET BY MOUTH ONCE DAILY 30 tablet 5  . ONETOUCH DELICA LANCETS 78G MISC Use three times daily to check blood sugar. 100 each 11  . tamsulosin (FLOMAX) 0.4  MG CAPS capsule Take 0.4 mg by mouth daily after breakfast.    . traMADol (ULTRAM) 50 MG tablet     . glipiZIDE (GLUCOTROL) 10 MG tablet Take 1 tablet (10 mg total) by mouth 2 (two) times daily. 60 tablet 6  . HYDROcodone-acetaminophen (NORCO/VICODIN) 5-325 MG tablet Take 1 tablet by mouth every 6 (six) hours as needed. 90 tablet 0  . fenofibrate micronized (LOFIBRA) 134 MG capsule TAKE 1 CAPSULE BY MOUTH EVERY MORNING BEFORE BREAKFAST (Patient not taking: Reported on 07/01/2016) 30 capsule 3  . finasteride (PROSCAR) 5 MG tablet Take 1 tablet (5 mg total) by mouth daily. (Patient not taking: Reported on 07/01/2016) 30 tablet 2  . INSULIN SYRINGE .5CC/29G 29G X 1/2" 0.5 ML MISC 1 Syringe by Does not apply route 3 (three) times daily. (Patient not taking: Reported on 07/01/2016) 100 each 6  . Insulin Syringe-Needle U-100  (INSULIN SYRINGE .5CC/31GX5/16") 31G X 5/16" 0.5 ML MISC USE THREE TIMES DAILY (Patient not taking: Reported on 07/01/2016) 100 each 5  . ciprofloxacin (CIPRO) 500 MG tablet Take 1 tablet (500 mg total) by mouth 2 (two) times daily. 14 tablet 0  . diclofenac sodium (VOLTAREN) 1 % GEL Apply 2 g topically 4 (four) times daily. 100 g 1  . meloxicam (MOBIC) 15 MG tablet TAKE 1 TABLET(15 MG) BY MOUTH DAILY 30 tablet 3  . metroNIDAZOLE (FLAGYL) 500 MG tablet Take 1 tablet (500 mg total) by mouth 3 (three) times daily. 21 tablet 0   No facility-administered medications prior to visit.     Review of Systems;  Patient denies headache, fevers, malaise, unintentional weight loss, skin rash, eye pain, sinus congestion and sinus pain, sore throat, dysphagia,  hemoptysis , cough, dyspnea, wheezing, chest pain, palpitations, orthopnea, edema, abdominal pain, nausea, melena, diarrhea, constipation, flank pain, dysuria, hematuria, urinary  Frequency, nocturia, numbness, tingling, seizures,  Focal weakness, Loss of consciousness,  Tremor, insomnia, depression, anxiety, and suicidal ideation.      Objective:  BP (!) 156/60 (BP Location: Left Arm, Patient Position: Sitting, Cuff Size: Normal)   Pulse 79   Temp 98.7 F (37.1 C) (Oral)   Resp 16   Ht 5\' 11"  (1.803 m)   Wt 197 lb (89.4 kg)   SpO2 97%   BMI 27.48 kg/m   BP Readings from Last 3 Encounters:  07/01/16 (!) 156/60  04/01/16 (!) 150/78  02/12/16 120/76    Wt Readings from Last 3 Encounters:  07/01/16 197 lb (89.4 kg)  04/01/16 195 lb 12 oz (88.8 kg)  02/12/16 196 lb 12 oz (89.2 kg)    General appearance: alert, cooperative and appears stated age Ears: normal TM's and external ear canals both ears Throat: lips, mucosa, and tongue normal; teeth and gums normal Neck: no adenopathy, no carotid bruit, supple, symmetrical, trachea midline and thyroid not enlarged, symmetric, no tenderness/mass/nodules Back: symmetric, no curvature. ROM normal.  No CVA tenderness. Lungs: clear to auscultation bilaterally Heart: regular rate and rhythm, S1, S2 normal, no murmur, click, rub or gallop Abdomen: soft, non-tender; bowel sounds normal; no masses,  no organomegaly Pulses: 2+ and symmetric Skin: Skin color, texture, turgor normal. No rashes or lesions Lymph nodes: Cervical, supraclavicular, and axillary nodes normal.  Lab Results  Component Value Date   HGBA1C 8.1 (H) 07/01/2016   HGBA1C 8.7 (H) 02/12/2016   HGBA1C 8.1 (H) 11/10/2015    Lab Results  Component Value Date   CREATININE 1.32 07/01/2016   CREATININE 1.44 04/01/2016   CREATININE 1.22  02/12/2016    Lab Results  Component Value Date   WBC 7.2 04/01/2016   HGB 12.6 (L) 04/01/2016   HCT 38.5 (L) 04/01/2016   PLT 325.0 04/01/2016   GLUCOSE 202 (H) 07/01/2016   CHOL 132 02/12/2016   TRIG 127.0 02/12/2016   HDL 47.50 02/12/2016   LDLDIRECT 49.0 07/01/2016   LDLCALC 59 02/12/2016   ALT 21 07/01/2016   AST 24 07/01/2016   NA 137 07/01/2016   K 5.0 07/01/2016   CL 102 07/01/2016   CREATININE 1.32 07/01/2016   BUN 23 07/01/2016   CO2 31 07/01/2016   TSH 2.50 05/04/2015   PSA 0.83 10/10/2014   INR 1.0 08/17/2012   HGBA1C 8.1 (H) 07/01/2016   MICROALBUR 6.8 (H) 02/12/2016    Ct Abdomen Pelvis W Contrast  Result Date: 04/10/2016 CLINICAL DATA:  Left lower quadrant pain for 3 weeks, diarrhea EXAM: CT ABDOMEN AND PELVIS WITH CONTRAST TECHNIQUE: Multidetector CT imaging of the abdomen and pelvis was performed using the standard protocol following bolus administration of intravenous contrast. CONTRAST:  15m ISOVUE-300 IOPAMIDOL (ISOVUE-300) INJECTION 61% COMPARISON:  CT abdomen pelvis of 03/29/2012 FINDINGS: Minimal scarring is noted posteriorly in the left lower lobe. No infiltrate or effusion is seen. A tiny nodule posterior medially deep in the right lower lobe sulcus is stable. The liver is somewhat low in attenuation which may indicate mild fatty infiltration.  Correlation with LFTs is recommended. There may be small gallstones layering within the gallbladder. The pancreas is normal in size in the pancreatic duct is not dilated. There are a few calcifications in the head of the pancreas which may indicate mild chronic calcific pancreatitis. The adrenal glands and spleen are unremarkable and there is a splenic calcified granuloma present. Several low-attenuation rounded renal lesions are present bilaterally most consistent with cysts, somewhat more prominent than on the CT from 2013. No hydronephrosis is seen. On delayed images, the pelvocaliceal systems are unremarkable and the proximal ureters are normal in caliber. Significant atherosclerotic disease of the abdominal aorta is noted. The urinary bladder is unremarkable. The prostate is normal in size for age. Multiple rectosigmoid colon and descending colon diverticula are present. There is no present evidence of diverticulitis. There is fluid throughout the colon which is consistent with the patient's history of diarrhea. The terminal ileum and the appendix are unremarkable. The 7 mm anterolisthesis of L4 on L5 which appears to be due to degenerative change involving the facet joints of the lower lumbar spine. Degenerative disc disease is noted most marked at L4-5 and L5-S1 levels. IMPRESSION: 1. No explanation for the patient's left lower quadrant pain is seen. There are multiple rectosigmoid and descending colon diverticula, but no diverticulitis is seen. 2. No renal or ureteral calculi are noted. 3. 7 mm anterolisthesis of L4 on L5 due to degenerative change of the facet joints. Degenerative disc disease at L4-5 and L5-S1. 4. Low-attenuation of the liver may indicate fatty infiltration. Correlate with LFTs. 5. Question small gallstones layering within the gallbladder. Electronically Signed   By: PIvar DrapeM.D.   On: 04/10/2016 13:26    Assessment & Plan:   Problem List Items Addressed This Visit    Left knee  DJD    Severe,  Left > right,  Managed with Vicodin  Supplied by me per [facility]. refilles have been monthly at #90/month. Dose reduced today to twice daily , maximum refill qty #60/month   Patient also receiving tramadol from Orthopedics,  #60 /month . Refill  history confirmed via [place] Controlled Substance databas, accessed by me today..      Relevant Medications   HYDROcodone-acetaminophen (NORCO/VICODIN) 5-325 MG tablet   Hyperlipidemia     LDL and triglycerides are at goal on current dose of atorvastatin. He has no side effects and liver enzymes are normal. No changes today   Lab Results  Component Value Date   CHOL 132 02/12/2016   HDL 47.50 02/12/2016   LDLCALC 59 02/12/2016   LDLDIRECT 49.0 07/01/2016   TRIG 127.0 02/12/2016   CHOLHDL 3 02/12/2016   Lab Results  Component Value Date   ALT 21 07/01/2016   AST 24 07/01/2016   ALKPHOS 31 (L) 07/01/2016   BILITOT 0.4 07/01/2016                Relevant Medications   losartan-hydrochlorothiazide (HYZAAR) 100-25 MG tablet   Other Relevant Orders   LDL cholesterol, direct (Completed)   Type 2 diabetes, uncontrolled, with peripheral circulatory disorder ([place])    Home blood sugars reviewed and continue to be much lower than that suggested by his quarterly A1c's.  He is having trouble affording Januvia due to [facility] cost of #$200/month but has just refilled it.will increase his 75/25 insulin doses based on current a1c to 8 units in the morning and 8 units in the evening.  .  Lab Results  Component Value Date   HGBA1C 8.1 (H) 07/01/2016         Relevant Medications   losartan-hydrochlorothiazide (HYZAAR) 100-25 MG tablet   Bursitis of hip, right    Managed with steroid injections and prn vicodin/tramadol.        Narcotic drug use    For DJD knees and OA hip,  Reducing vicodin use to #60/month going forward, given concurrent use of tramadol.  Patient advised to supplememnt with Aleve twice daily and tylenol twice daily          Other Visit Diagnoses    Diabetes mellitus without complication ([place])    -  Primary   Relevant Medications   losartan-hydrochlorothiazide (HYZAAR) 100-25 MG tablet   Other Relevant Orders   Hemoglobin A1c (Completed)   Comprehensive metabolic panel (Completed)      I have discontinued Mr. Velardi diclofenac sodium, ciprofloxacin, and metroNIDAZOLE. I am also having him start on losartan-hydrochlorothiazide. Additionally, I am having him maintain his INSULIN SYRINGE .5CC/29G, INSULIN SYRINGE .5CC/31GX5/16", metoprolol succinate, aspirin EC, traMADol, tamsulosin, magic mouthwash, finasteride, ONE TOUCH ULTRA SYSTEM KIT, ONETOUCH DELICA LANCETS 33A, latanoprost, cilostazol, JANUVIA, gabapentin, INS SYRINGE/NEEDLE 1CC/28G, meloxicam, amLODipine, glipiZIDE, losartan, fenofibrate micronized, atorvastatin, glucose blood, insulin lispro protamine-lispro, famotidine, metFORMIN, and HYDROcodone-acetaminophen.  Meds ordered this encounter  Medications  . DISCONTD: HYDROcodone-acetaminophen (NORCO/VICODIN) 5-325 MG tablet    Sig: Take 1 tablet by mouth 2 (two) times daily as needed.    Dispense:  60 tablet    Refill:  0    May Refill on or after April 20 2016  Max 3 daily  . losartan-hydrochlorothiazide (HYZAAR) 100-25 MG tablet    Sig: Take 1 tablet by mouth daily.    Dispense:  90 tablet    Refill:  0    KEEP ON FILE FOR NEXT LOSARTAN REFILL note addition of hctz  . DISCONTD: HYDROcodone-acetaminophen (NORCO/VICODIN) 5-325 MG tablet    Sig: Take 1 tablet by mouth 2 (two) times daily as needed.    Dispense:  60 tablet    Refill:  0    May Refill on or after July 31 2016  Max 2 daily  . DISCONTD: HYDROcodone-acetaminophen (NORCO/VICODIN) 5-325 MG tablet    Sig: Take 1 tablet by mouth 2 (two) times daily as needed.    Dispense:  60 tablet    Refill:  0    May Refill on or after August 31 2016  Max 2 daily  . DISCONTD: HYDROcodone-acetaminophen (NORCO/VICODIN) 5-325 MG tablet     Sig: Take 1 tablet by mouth 2 (two) times daily as needed.    Dispense:  60 tablet    Refill:  0    May Refill on or after August 31 2016  Max 2 dail  . HYDROcodone-acetaminophen (NORCO/VICODIN) 5-325 MG tablet    Sig: Take 1 tablet by mouth 2 (two) times daily as needed.    Dispense:  60 tablet    Refill:  0    maximum 2 daily    Medications Discontinued During This Encounter  Medication Reason  . ciprofloxacin (CIPRO) 500 MG tablet One time medication  . diclofenac sodium (VOLTAREN) 1 % GEL Cost of medication  . glipiZIDE (GLUCOTROL) 10 MG tablet Duplicate  . meloxicam (MOBIC) 15 MG tablet Duplicate  . metroNIDAZOLE (FLAGYL) 500 MG tablet One time medication  . HYDROcodone-acetaminophen (NORCO/VICODIN) 5-325 MG tablet Reorder  . HYDROcodone-acetaminophen (NORCO/VICODIN) 5-325 MG tablet Reorder  . HYDROcodone-acetaminophen (NORCO/VICODIN) 5-325 MG tablet Reorder  . HYDROcodone-acetaminophen (NORCO/VICODIN) 5-325 MG tablet Reorder  . HYDROcodone-acetaminophen (NORCO/VICODIN) 5-325 MG tablet Reorder    Follow-up: Return in about 3 months (around 09/30/2016) for follow up diabetes.   [name], MD

## 2016-07-01 NOTE — Patient Instructions (Addendum)
I am reducing your Vicodin dose to #60 /month,  Or 2 per day (every 12 hours  ) You should take one Aleve  And one  500 mg tylenol  Every 12 hours  (in between the vicodin doses)   We may end up suspending the Januvia and increasing  increasing the insulin to save you $$$$$   Your next refill of losartan will be combined with HCTZ to make it stronger

## 2016-07-02 DIAGNOSIS — F1190 Opioid use, unspecified, uncomplicated: Secondary | ICD-10-CM | POA: Insufficient documentation

## 2016-07-02 LAB — COMPREHENSIVE METABOLIC PANEL
ALK PHOS: 31 U/L — AB (ref 39–117)
ALT: 21 U/L (ref 0–53)
AST: 24 U/L (ref 0–37)
Albumin: 4.2 g/dL (ref 3.5–5.2)
BILIRUBIN TOTAL: 0.4 mg/dL (ref 0.2–1.2)
BUN: 23 mg/dL (ref 6–23)
CALCIUM: 8.9 mg/dL (ref 8.4–10.5)
CO2: 31 mEq/L (ref 19–32)
Chloride: 102 mEq/L (ref 96–112)
Creatinine, Ser: 1.32 mg/dL (ref 0.40–1.50)
GFR: 55.3 mL/min — ABNORMAL LOW (ref >60.00)
Glucose, Bld: 202 mg/dL — ABNORMAL HIGH (ref 70–99)
Potassium: 5 mEq/L (ref 3.5–5.1)
Sodium: 137 mEq/L (ref 135–145)
TOTAL PROTEIN: 6.9 g/dL (ref 6.0–8.3)

## 2016-07-02 LAB — LDL CHOLESTEROL, DIRECT: Direct LDL: 49 mg/dL

## 2016-07-02 LAB — HEMOGLOBIN A1C: Hgb A1c MFr Bld: 8.1 % — ABNORMAL HIGH (ref 4.6–6.5)

## 2016-07-02 NOTE — Assessment & Plan Note (Signed)
Managed with steroid injections and prn vicodin/tramadol.

## 2016-07-02 NOTE — Assessment & Plan Note (Signed)
Home blood sugars reviewed and continue to be much lower than that suggested by his quarterly A1c's.  He is having trouble affording Januvia due to [facility] cost of #$200/month but has just refilled it.will increase his 75/25 insulin doses based on current a1c to 8 units in the morning and 8 units in the evening.  .  Lab Results  Component Value Date   HGBA1C 8.1 (H) 07/01/2016

## 2016-07-02 NOTE — Assessment & Plan Note (Signed)
LDL and triglycerides are at goal on current dose of atorvastatin. He has no side effects and liver enzymes are normal. No changes today   Lab Results  Component Value Date   CHOL 132 02/12/2016   HDL 47.50 02/12/2016   LDLCALC 59 02/12/2016   LDLDIRECT 49.0 07/01/2016   TRIG 127.0 02/12/2016   CHOLHDL 3 02/12/2016   Lab Results  Component Value Date   ALT 21 07/01/2016   AST 24 07/01/2016   ALKPHOS 31 (L) 07/01/2016   BILITOT 0.4 07/01/2016

## 2016-07-02 NOTE — Assessment & Plan Note (Signed)
For DJD knees and OA hip,  Reducing vicodin use to #60/month going forward, given concurrent use of tramadol.  Patient advised to supplememnt with Aleve twice daily and tylenol twice daily

## 2016-07-02 NOTE — Assessment & Plan Note (Addendum)
Severe,  Left > right,  Managed with Vicodin  Supplied by me per [facility]. refilles have been monthly at #90/month. Dose reduced today to twice daily , maximum refill qty #60/month   Patient also receiving tramadol from Orthopedics,  #60 /month . Refill history confirmed via [place] Controlled Substance databas, accessed by me today.[name]

## 2016-07-15 ENCOUNTER — Other Ambulatory Visit: Payer: Self-pay | Admitting: Internal Medicine

## 2016-07-19 ENCOUNTER — Telehealth: Payer: Self-pay | Admitting: *Deleted

## 2016-07-19 NOTE — Telephone Encounter (Signed)
Patient stated that he was seen by [facility], and was diagnosed with basal cell carcinoma . He questioned if the medication Erivedge would interfere with the other medications he's currently taking.  Pt contact [phone]

## 2016-07-19 NOTE — Telephone Encounter (Signed)
Advised patient he should call dermatology for  Medication varication since dermatology prescribed.

## 2016-07-26 ENCOUNTER — Other Ambulatory Visit: Payer: Self-pay | Admitting: Internal Medicine

## 2016-08-12 ENCOUNTER — Other Ambulatory Visit: Payer: Self-pay | Admitting: Internal Medicine

## 2016-08-28 ENCOUNTER — Other Ambulatory Visit: Payer: Self-pay | Admitting: Internal Medicine

## 2016-09-03 ENCOUNTER — Encounter: Payer: Self-pay | Admitting: Internal Medicine

## 2016-09-03 ENCOUNTER — Ambulatory Visit (INDEPENDENT_AMBULATORY_CARE_PROVIDER_SITE_OTHER): Payer: Medicare Other | Admitting: Internal Medicine

## 2016-09-03 VITALS — BP 124/60 | HR 84 | Temp 97.8°F | Resp 12 | Ht 71.0 in | Wt 193.0 lb

## 2016-09-03 DIAGNOSIS — M791 Myalgia, unspecified site: Secondary | ICD-10-CM

## 2016-09-03 DIAGNOSIS — R5383 Other fatigue: Secondary | ICD-10-CM

## 2016-09-03 DIAGNOSIS — M62838 Other muscle spasm: Secondary | ICD-10-CM

## 2016-09-03 DIAGNOSIS — R630 Anorexia: Secondary | ICD-10-CM | POA: Diagnosis not present

## 2016-09-03 DIAGNOSIS — E1151 Type 2 diabetes mellitus with diabetic peripheral angiopathy without gangrene: Secondary | ICD-10-CM

## 2016-09-03 DIAGNOSIS — M1732 Unilateral post-traumatic osteoarthritis, left knee: Secondary | ICD-10-CM

## 2016-09-03 DIAGNOSIS — E1165 Type 2 diabetes mellitus with hyperglycemia: Secondary | ICD-10-CM

## 2016-09-03 DIAGNOSIS — IMO0002 Reserved for concepts with insufficient information to code with codable children: Secondary | ICD-10-CM

## 2016-09-03 LAB — CBC WITH DIFFERENTIAL/PLATELET
BASOS ABS: 0 10*3/uL (ref 0.0–0.1)
Basophils Relative: 0.4 % (ref 0.0–3.0)
Eosinophils Absolute: 0.3 10*3/uL (ref 0.0–0.7)
Eosinophils Relative: 4 % (ref 0.0–5.0)
HCT: 41 % (ref 39.0–52.0)
HEMOGLOBIN: 13.5 g/dL (ref 13.0–17.0)
LYMPHS ABS: 2 10*3/uL (ref 0.7–4.0)
LYMPHS PCT: 23 % (ref 12.0–46.0)
MCHC: 32.8 g/dL (ref 30.0–36.0)
MCV: 85.7 fl (ref 78.0–100.0)
MONOS PCT: 7.9 % (ref 3.0–12.0)
Monocytes Absolute: 0.7 10*3/uL (ref 0.1–1.0)
NEUTROS PCT: 64.7 % (ref 43.0–77.0)
Neutro Abs: 5.6 10*3/uL (ref 1.4–7.7)
Platelets: 339 10*3/uL (ref 150.0–400.0)
RBC: 4.78 Mil/uL (ref 4.22–5.81)
RDW: 14.4 % (ref 11.5–15.5)
WBC: 8.7 10*3/uL (ref 4.0–10.5)

## 2016-09-03 LAB — MAGNESIUM: MAGNESIUM: 2.2 mg/dL (ref 1.5–2.5)

## 2016-09-03 LAB — COMPREHENSIVE METABOLIC PANEL
ALBUMIN: 4.3 g/dL (ref 3.5–5.2)
ALK PHOS: 32 U/L — AB (ref 39–117)
ALT: 26 U/L (ref 0–53)
AST: 22 U/L (ref 0–37)
BUN: 27 mg/dL — ABNORMAL HIGH (ref 6–23)
CALCIUM: 9.6 mg/dL (ref 8.4–10.5)
CHLORIDE: 101 meq/L (ref 96–112)
CO2: 31 mEq/L (ref 19–32)
Creatinine, Ser: 1.67 mg/dL — ABNORMAL HIGH (ref 0.40–1.50)
GFR: 42.14 mL/min — AB (ref >60.00)
Glucose, Bld: 131 mg/dL — ABNORMAL HIGH (ref 70–99)
POTASSIUM: 4.3 meq/L (ref 3.5–5.1)
Sodium: 140 mEq/L (ref 135–145)
TOTAL PROTEIN: 7 g/dL (ref 6.0–8.3)
Total Bilirubin: 0.4 mg/dL (ref 0.2–1.2)

## 2016-09-03 LAB — CK: Total CK: 276 U/L — ABNORMAL HIGH (ref 7–232)

## 2016-09-03 MED ORDER — CYCLOBENZAPRINE HCL 5 MG PO TABS: 5.0000 mg | ORAL_TABLET | Freq: Three times a day (TID) | ORAL | 3 refills | Status: DC | PRN

## 2016-09-03 MED ORDER — ONDANSETRON HCL 4 MG PO TABS: 4.0000 mg | ORAL_TABLET | Freq: Three times a day (TID) | ORAL | 3 refills | Status: DC | PRN

## 2016-09-03 MED ORDER — HYDROCODONE-ACETAMINOPHEN 5-325 MG PO TABS: ORAL_TABLET | ORAL | 0 refills | Status: DC

## 2016-09-03 NOTE — Patient Instructions (Addendum)
You have decreased pulses in your right foot.  I will check with Dr Lucky Cowboy about your circulation  I am prescribing flexeril for your muscle cramps and zofran for your nausea  Reduce your glipizide dose to 5 mg  (1/2 tablet ) in the evening to avoid low blood sugars from not eating as much   You are already taking  2 vicodin and 2 tramadol daily for your knee pain.  I suggest taking 1000 mg tylenol  at lunch  Return in mid to late  December for your A1c  To bve check and to follow up on your  diabetes

## 2016-09-03 NOTE — Progress Notes (Signed)
Pre-visit discussion using our clinic review tool. No additional management support is needed unless otherwise documented below in the visit note.  

## 2016-09-03 NOTE — Progress Notes (Signed)
Subjective:  Patient ID: Henry Lindsey, male    DOB: Dec 02, 1932  Age: 82 y.o. MRN: [id number]  CC: The primary encounter diagnosis was Muscle spasm. Diagnoses of Loss of appetite, Fatigue, unspecified type, Post-traumatic osteoarthritis of left knee, Peripheral vascular disease in diabetes mellitus ([place]), Type 2 diabetes, uncontrolled, with peripheral circulatory disorder ([place]), and Myalgia were also pertinent to this visit.  HPI [name] presents for follow up on chronic conditions including type 2 DM, hypertension , hyperlipidemia, and chronic knee pain due to DJD.   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  Started taking the medication 6 weeks ago, started having muscle cramps in  his feet, toes, and calves almost immediately .  Has also been having nocturnal emesis occurring around 1:00 am  if he eats anything but creamed potates, fruit or apple sauce..  Duration of therapy  Is 3 months,  Until January 3rd .  2) still having knee pain .  Wants to resume vicodin three times daily , currently  taking it twice daily,  morning and evening.  Getting tramadol from orthopedist and using it twice daily , in the eevning and at 2 am.  he has not had any ER visits  And has not requested any early refills.  His Refill history was confirmed via  Controlled Substance database by me today during his visit and there have been no prescriptions of controlled substances filled from any providers other than me. [name]   3) PAD:  Saw Dew in July checked both feet.  Results requested and received after hours.   4) Type 2 DM:  Because of his decreased appetite due to postprandial nausea , he is having recurrent low BS in the morning.     Lab Results  Component Value Date   HGBA1C 8.1 (H) 07/01/2016      Outpatient Medications Prior to  Visit  Medication Sig Dispense Refill  . Alum & Mag Hydroxide-Simeth (MAGIC MOUTHWASH) SOLN Take 5 mLs by mouth 3 (three) times daily as needed for mouth pain.    [name] amLODipine (NORVASC) 10 MG tablet TAKE 1 TABLET BY MOUTH EVERY DAY 30 tablet 0  . aspirin EC 81 MG tablet Take 1 tablet (81 mg total) by mouth daily. 90 tablet 3  . atorvastatin (LIPITOR) 40 MG tablet TAKE 1 TABLET BY MOUTH EVERY DAY 90 tablet 2  . Blood Glucose Monitoring Suppl (ONE TOUCH ULTRA SYSTEM KIT) w/Device KIT 1 kit by Does not apply route once. Use DX code E11.59 1 each 0  . cilostazol (PLETAL) 100 MG tablet TAKE 1 TABLET(100 MG) BY MOUTH TWICE DAILY 60 tablet 3  . famotidine (PEPCID) 20 MG tablet TAKE 1 TABLET(20 MG) BY MOUTH TWICE DAILY 60 tablet 5  . fenofibrate micronized (LOFIBRA) 134 MG capsule TAKE 1 CAPSULE BY MOUTH EVERY MORNING BEFORE BREAKFAST 30 capsule 0  . finasteride (PROSCAR) 5 MG tablet Take 1 tablet (5 mg total) by mouth daily. 30 tablet 2  . gabapentin (NEURONTIN) 400 MG capsule Take 1 capsule (400 mg total) by mouth 3 (three) times daily. 120 capsule 4  . glipiZIDE (GLUCOTROL) 10 MG tablet TAKE 1 TABLET(10 MG) BY MOUTH TWICE DAILY 60 tablet 4  . glucose blood test strip Use as instructed three times daily 100 each 12  .  INS SYRINGE/NEEDLE 1CC/28G (B-D INSULIN SYRINGE 1CC/28G) 28G X 1/2" 1 ML MISC USE TO ADMINISTER INSULIN DAILY 100 each 0  . insulin lispro protamine-lispro (HUMALOG MIX 75/25) (75-25) 100 UNIT/ML SUSP injection Inject 10 units right before  breakfast and 6 units before dinner 10 mL 5  . INSULIN SYRINGE .5CC/29G 29G X 1/2" 0.5 ML MISC 1 Syringe by Does not apply route 3 (three) times daily. 100 each 6  . Insulin Syringe-Needle U-100 (INSULIN SYRINGE .5CC/31GX5/16") 31G X 5/16" 0.5 ML MISC USE THREE TIMES DAILY 100 each 5  . JANUVIA 100 MG tablet TAKE 1 TABLET(100 MG) BY MOUTH DAILY. NOTE DOSE INCREASE 30 tablet 3  . latanoprost (XALATAN) 0.005 % ophthalmic solution INSTILL 1 DROP INTO BOTH  EYES QD  5  . losartan-hydrochlorothiazide (HYZAAR) 100-25 MG tablet Take 1 tablet by mouth daily. 90 tablet 0  . meloxicam (MOBIC) 15 MG tablet TAKE 1 TABLET(15 MG) BY MOUTH DAILY 30 tablet 5  . metFORMIN (GLUCOPHAGE) 1000 MG tablet TAKE 1 TABLET BY MOUTH TWICE DAILY 180 tablet 1  . metoprolol succinate (TOPROL-XL) 100 MG 24 hr tablet TAKE 1 TABLET BY MOUTH ONCE DAILY 30 tablet 5  . ONETOUCH DELICA LANCETS 40C MISC Use three times daily to check blood sugar. 100 each 11  . tamsulosin (FLOMAX) 0.4 MG CAPS capsule Take 0.4 mg by mouth daily after breakfast.    . traMADol (ULTRAM) 50 MG tablet     . amLODipine (NORVASC) 10 MG tablet TAKE 1 TABLET BY MOUTH EVERY DAY 30 tablet 4  . cilostazol (PLETAL) 100 MG tablet TAKE 1 TABLET(100 MG) BY MOUTH TWICE DAILY 60 tablet 5  . fenofibrate micronized (LOFIBRA) 134 MG capsule TAKE 1 CAPSULE BY MOUTH EVERY MORNING BEFORE BREAKFAST 30 capsule 3  . HYDROcodone-acetaminophen (NORCO/VICODIN) 5-325 MG tablet Take 1 tablet by mouth 2 (two) times daily as needed. 60 tablet 0  . JANUVIA 100 MG tablet TAKE 1 TABLET(100 MG) BY MOUTH DAILY. NOTE DOSE INCREASE 30 tablet 5  . losartan (COZAAR) 100 MG tablet TAKE 1 TABLET BY MOUTH EVERY DAY 30 tablet 4   No facility-administered medications prior to visit.     Review of Systems;  Patient denies headache, fevers, malaise, unintentional weight loss, skin rash, eye pain, sinus congestion and sinus pain, sore throat, dysphagia,  hemoptysis , cough, dyspnea, wheezing, chest pain, palpitations, orthopnea, edema, abdominal pain, , melena, diarrhea, constipation, flank pain, dysuria, hematuria, urinary  Frequency, nocturia, numbness, tingling, seizures,  Focal weakness, Loss of consciousness,  Tremor, insomnia, depression, anxiety, and suicidal ideation.      Objective:  BP 124/60   Pulse 84   Temp 97.8 F (36.6 C) (Oral)   Resp 12   Ht '5\' 11"'  (1.803 m)   Wt 193 lb (87.5 kg)   SpO2 94%   BMI 26.92 kg/m   BP  Readings from Last 3 Encounters:  09/03/16 124/60  07/01/16 (!) 156/60  04/01/16 (!) 150/78    Wt Readings from Last 3 Encounters:  09/03/16 193 lb (87.5 kg)  07/01/16 197 lb (89.4 kg)  04/01/16 195 lb 12 oz (88.8 kg)    General appearance: alert, cooperative and appears stated age Ears: normal TM's and external ear canals both ears Throat: lips, mucosa, and tongue normal; teeth and gums normal Neck: no adenopathy, no carotid bruit, supple, symmetrical, trachea midline and thyroid not enlarged, symmetric, no tenderness/mass/nodules Back: symmetric, no curvature. ROM normal. No CVA tenderness. Lungs: clear to auscultation bilaterally Heart: regular rate and  rhythm, S1, S2 normal, no murmur, click, rub or gallop Abdomen: soft, non-tender; bowel sounds normal; no masses,  no organomegaly Pulses: non palpable right foot, 2+in left   Cap refill < 2 sec bilaterally Skin: Skin color, texture, turgor normal. No rashes or lesions Lymph nodes: Cervical, supraclavicular, and axillary nodes normal.  Lab Results  Component Value Date   HGBA1C 8.1 (H) 07/01/2016   HGBA1C 8.7 (H) 02/12/2016   HGBA1C 8.1 (H) 11/10/2015    Lab Results  Component Value Date   CREATININE 1.67 (H) 09/03/2016   CREATININE 1.32 07/01/2016   CREATININE 1.44 04/01/2016    Lab Results  Component Value Date   WBC 8.7 09/03/2016   HGB 13.5 09/03/2016   HCT 41.0 09/03/2016   PLT 339.0 09/03/2016   GLUCOSE 131 (H) 09/03/2016   CHOL 132 02/12/2016   TRIG 127.0 02/12/2016   HDL 47.50 02/12/2016   LDLDIRECT 49.0 07/01/2016   LDLCALC 59 02/12/2016   ALT 26 09/03/2016   AST 22 09/03/2016   NA 140 09/03/2016   K 4.3 09/03/2016   CL 101 09/03/2016   CREATININE 1.67 (H) 09/03/2016   BUN 27 (H) 09/03/2016   CO2 31 09/03/2016   TSH 2.50 05/04/2015   PSA 0.83 10/10/2014   INR 1.0 08/17/2012   HGBA1C 8.1 (H) 07/01/2016   MICROALBUR 6.8 (H) 02/12/2016    Ct Abdomen Pelvis W Contrast  Result Date:  04/10/2016 CLINICAL DATA:  Left lower quadrant pain for 3 weeks, diarrhea EXAM: CT ABDOMEN AND PELVIS WITH CONTRAST TECHNIQUE: Multidetector CT imaging of the abdomen and pelvis was performed using the standard protocol following bolus administration of intravenous contrast. CONTRAST:  130m ISOVUE-300 IOPAMIDOL (ISOVUE-300) INJECTION 61% COMPARISON:  CT abdomen pelvis of 03/29/2012 FINDINGS: Minimal scarring is noted posteriorly in the left lower lobe. No infiltrate or effusion is seen. A tiny nodule posterior medially deep in the right lower lobe sulcus is stable. The liver is somewhat low in attenuation which may indicate mild fatty infiltration. Correlation with LFTs is recommended. There may be small gallstones layering within the gallbladder. The pancreas is normal in size in the pancreatic duct is not dilated. There are a few calcifications in the head of the pancreas which may indicate mild chronic calcific pancreatitis. The adrenal glands and spleen are unremarkable and there is a splenic calcified granuloma present. Several low-attenuation rounded renal lesions are present bilaterally most consistent with cysts, somewhat more prominent than on the CT from 2013. No hydronephrosis is seen. On delayed images, the pelvocaliceal systems are unremarkable and the proximal ureters are normal in caliber. Significant atherosclerotic disease of the abdominal aorta is noted. The urinary bladder is unremarkable. The prostate is normal in size for age. Multiple rectosigmoid colon and descending colon diverticula are present. There is no present evidence of diverticulitis. There is fluid throughout the colon which is consistent with the patient's history of diarrhea. The terminal ileum and the appendix are unremarkable. The 7 mm anterolisthesis of L4 on L5 which appears to be due to degenerative change involving the facet joints of the lower lumbar spine. Degenerative disc disease is noted most marked at L4-5 and L5-S1  levels. IMPRESSION: 1. No explanation for the patient's left lower quadrant pain is seen. There are multiple rectosigmoid and descending colon diverticula, but no diverticulitis is seen. 2. No renal or ureteral calculi are noted. 3. 7 mm anterolisthesis of L4 on L5 due to degenerative change of the facet joints. Degenerative disc disease at L4-5 and  L5-S1. 4. Low-attenuation of the liver may indicate fatty infiltration. Correlate with LFTs. 5. Question small gallstones layering within the gallbladder. Electronically Signed   By: Ivar Drape M.D.   On: 04/10/2016 13:26    Assessment & Plan:   Problem List Items Addressed This Visit    Left knee DJD    Severe,  Left > right,  Managed with Vicodin  Supplied by me per [facility]. refilles have been reduced to #60/month.   Patient also receiving tramadol from Orthopedics,  #60 /month . Refill history confirmed via [place] Controlled Substance databas, accessed by me today..      Relevant Medications   ERIVEDGE 150 MG capsule   cyclobenzaprine (FLEXERIL) 5 MG tablet   HYDROcodone-acetaminophen (NORCO/VICODIN) 5-325 MG tablet   Type 2 diabetes, uncontrolled, with peripheral circulatory disorder (HCC)    Recurrent lows in the mornoing due to meal avoidance at night secondary to medication induce post prandial emesis occurring nocturnally Reduce evening glipizide to 5 mg . Continue insulin 75.25 bid and januvia    Lab Results  Component Value Date   HGBA1C 8.1 (H) 07/01/2016         Peripheral vascular disease in diabetes mellitus (HCC)    Exam suggests recurrent stenosis on the recent and recent ABIs confirm progression with right sided ABIdown from 0.99 to 0.87  .  Patient is asymptomatic       Myalgia    Secondary to Erivedge, prescribed by Dr Erin Fulling for treatment of squamous cell CA of nose. .  CK is mildly elevated today.  Renal function is unchanged.    Muscle relaxer and zofran prescribed.        Other Visit Diagnoses    Muscle spasm    -   Primary   Relevant Orders   CK (Creatine Kinase) (Completed)   Comp Met (CMET) (Completed)   Magnesium (Completed)   Loss of appetite       Fatigue, unspecified type       Relevant Orders   CBC with Differential/Platelet (Completed)      I have discontinued Mr. Fyock losartan. I am also having him start on cyclobenzaprine and ondansetron. Additionally, I am having him maintain his INSULIN SYRINGE .5CC/29G, INSULIN SYRINGE .5CC/31GX5/16", metoprolol succinate, aspirin EC, traMADol, tamsulosin, magic mouthwash, finasteride, ONE TOUCH ULTRA SYSTEM KIT, ONETOUCH DELICA LANCETS 26Z, latanoprost, gabapentin, INS SYRINGE/NEEDLE 1CC/28G, meloxicam, glipiZIDE, glucose blood, insulin lispro protamine-lispro, famotidine, losartan-hydrochlorothiazide, atorvastatin, metFORMIN, JANUVIA, cilostazol, amLODipine, fenofibrate micronized, ERIVEDGE, and HYDROcodone-acetaminophen.  Meds ordered this encounter  Medications  . ERIVEDGE 150 MG capsule    Sig: Take 1 tablet by mouth daily.  [name] DISCONTD: HYDROcodone-acetaminophen (NORCO/VICODIN) 5-325 MG tablet    Sig: TK 1 T PO BID PRN    Refill:  0  . cyclobenzaprine (FLEXERIL) 5 MG tablet    Sig: Take 1 tablet (5 mg total) by mouth 3 (three) times daily as needed for muscle spasms.    Dispense:  90 tablet    Refill:  3  . ondansetron (ZOFRAN) 4 MG tablet    Sig: Take 1 tablet (4 mg total) by mouth every 8 (eight) hours as needed for nausea or vomiting.    Dispense:  30 tablet    Refill:  3  . DISCONTD: HYDROcodone-acetaminophen (NORCO/VICODIN) 5-325 MG tablet    Sig: One tablet two times daily as needed for knee pain    Dispense:  60 tablet    Refill:  0  . HYDROcodone-acetaminophen (NORCO/VICODIN) 5-325 MG tablet  Sig: One tablet two times daily as needed for knee pain    Dispense:  60 tablet    Refill:  0    May refill on or after Sep 30 2016    Medications Discontinued During This Encounter  Medication Reason  . HYDROcodone-acetaminophen  (NORCO/VICODIN) 5-325 MG tablet Error  . HYDROcodone-acetaminophen (NORCO/VICODIN) 5-325 MG tablet Reorder  . HYDROcodone-acetaminophen (NORCO/VICODIN) 5-325 MG tablet Reorder  . amLODipine (NORVASC) 10 MG tablet   . fenofibrate micronized (LOFIBRA) 134 MG capsule   . cilostazol (PLETAL) 100 MG tablet   . JANUVIA 100 MG tablet   . losartan (COZAAR) 100 MG tablet     Follow-up: Return in about 4 weeks (around 10/01/2016), or A1c and OV after Dec 11 .   [name], MD

## 2016-09-04 ENCOUNTER — Telehealth: Payer: Self-pay

## 2016-09-04 NOTE — Telephone Encounter (Signed)
PA for flexeril started on cover my meds

## 2016-09-04 NOTE — Telephone Encounter (Signed)
PA for cyclobensaprine HCL was denied as it is a benefit exclusion. I am going to resubmit under different insurance that pharmacy provided.

## 2016-09-05 ENCOUNTER — Encounter: Payer: Self-pay | Admitting: Internal Medicine

## 2016-09-05 DIAGNOSIS — M791 Myalgia, unspecified site: Secondary | ICD-10-CM | POA: Insufficient documentation

## 2016-09-05 NOTE — Assessment & Plan Note (Addendum)
Recurrent lows in the mornoing due to meal avoidance at night secondary to medication induce post prandial emesis occurring nocturnally Reduce evening glipizide to 5 mg . Continue insulin 75.25 bid and januvia    Lab Results  Component Value Date   HGBA1C 8.1 (H) 07/01/2016

## 2016-09-05 NOTE — Assessment & Plan Note (Signed)
Secondary to Erivedge, prescribed by Dr Erin Fulling for treatment of squamous cell CA of nose. .  CK is mildly elevated today.  Renal function is unchanged.    Muscle relaxer and zofran prescribed.

## 2016-09-05 NOTE — Assessment & Plan Note (Signed)
Exam suggests recurrent stenosis on the recent and recent ABIs confirm progression with right sided ABIdown from 0.99 to 0.87  .  Patient is asymptomatic

## 2016-09-05 NOTE — Assessment & Plan Note (Signed)
Severe,  Left > right,  Managed with Vicodin  Supplied by me per [facility]. refilles have been reduced to #60/month.   Patient also receiving tramadol from Orthopedics,  #60 /month . Refill history confirmed via [place] Controlled Substance databas, accessed by me today.[name]

## 2016-09-06 ENCOUNTER — Telehealth: Payer: Self-pay | Admitting: *Deleted

## 2016-09-06 NOTE — Telephone Encounter (Signed)
Patient requested lab results Pt contact [phone]

## 2016-09-06 NOTE — Telephone Encounter (Signed)
Please advise, thanks.

## 2016-09-06 NOTE — Telephone Encounter (Signed)
Labs are available to look at. At your on convenience.

## 2016-09-07 ENCOUNTER — Other Ambulatory Visit: Payer: Self-pay | Admitting: Internal Medicine

## 2016-09-07 DIAGNOSIS — M6282 Rhabdomyolysis: Secondary | ICD-10-CM

## 2016-09-08 ENCOUNTER — Other Ambulatory Visit: Payer: Self-pay | Admitting: Internal Medicine

## 2016-09-09 ENCOUNTER — Telehealth: Payer: Self-pay | Admitting: Internal Medicine

## 2016-09-09 NOTE — Telephone Encounter (Signed)
Pt called requesting his lab results. Thank you!  Call pt @ [phone]

## 2016-09-10 MED ORDER — CYCLOBENZAPRINE HCL 5 MG PO TABS: 5.0000 mg | ORAL_TABLET | Freq: Three times a day (TID) | ORAL | 3 refills | Status: DC | PRN

## 2016-09-10 NOTE — Addendum Note (Signed)
Addended by: Bevelyn Ngo on: 09/10/2016 03:43 PM   Modules accepted: Orders

## 2016-09-10 NOTE — Telephone Encounter (Signed)
PA denied, please advise if you want a appeal completed. thanks

## 2016-09-10 NOTE — Telephone Encounter (Signed)
Spoke with the patient, [facility] wanted $82 out of [place].  Good Rx will fill for $10 at [facility], sent Rx to [address] [place] . Thanks

## 2016-09-10 NOTE — Telephone Encounter (Signed)
Results reviewed with patient on 11/20. Thanks

## 2016-09-16 ENCOUNTER — Other Ambulatory Visit (INDEPENDENT_AMBULATORY_CARE_PROVIDER_SITE_OTHER): Payer: Medicare Other

## 2016-09-16 DIAGNOSIS — M6282 Rhabdomyolysis: Secondary | ICD-10-CM | POA: Diagnosis not present

## 2016-09-16 LAB — BASIC METABOLIC PANEL
BUN: 21 mg/dL (ref 6–23)
CO2: 32 meq/L (ref 19–32)
Calcium: 9.8 mg/dL (ref 8.4–10.5)
Chloride: 98 mEq/L (ref 96–112)
Creatinine, Ser: 1.48 mg/dL (ref 0.40–1.50)
GFR: 48.44 mL/min — AB (ref >60.00)
GLUCOSE: 85 mg/dL (ref 70–99)
POTASSIUM: 5.3 meq/L — AB (ref 3.5–5.1)
Sodium: 136 mEq/L (ref 135–145)

## 2016-09-16 LAB — CK: Total CK: 181 U/L (ref 7–232)

## 2016-09-19 ENCOUNTER — Encounter: Payer: Self-pay | Admitting: *Deleted

## 2016-09-26 ENCOUNTER — Other Ambulatory Visit: Payer: Self-pay | Admitting: Internal Medicine

## 2016-10-03 ENCOUNTER — Telehealth: Payer: Self-pay

## 2016-10-03 DIAGNOSIS — E1121 Type 2 diabetes mellitus with diabetic nephropathy: Secondary | ICD-10-CM

## 2016-10-03 DIAGNOSIS — IMO0002 Reserved for concepts with insufficient information to code with codable children: Secondary | ICD-10-CM

## 2016-10-03 DIAGNOSIS — Z794 Long term (current) use of insulin: Principal | ICD-10-CM

## 2016-10-03 DIAGNOSIS — E1165 Type 2 diabetes mellitus with hyperglycemia: Principal | ICD-10-CM

## 2016-10-03 DIAGNOSIS — I2510 Atherosclerotic heart disease of native coronary artery without angina pectoris: Secondary | ICD-10-CM

## 2016-10-03 NOTE — Telephone Encounter (Signed)
Pt coming for labs 10/04/16. Note sections says for A1c. Please advise and place future order. Thank you.

## 2016-10-03 NOTE — Telephone Encounter (Signed)
Fasting labs and urine tests ordered.

## 2016-10-04 ENCOUNTER — Ambulatory Visit: Payer: Medicare Other

## 2016-10-04 ENCOUNTER — Telehealth: Payer: Self-pay

## 2016-10-04 ENCOUNTER — Other Ambulatory Visit (INDEPENDENT_AMBULATORY_CARE_PROVIDER_SITE_OTHER): Payer: Medicare Other

## 2016-10-04 ENCOUNTER — Encounter: Payer: Self-pay | Admitting: Internal Medicine

## 2016-10-04 ENCOUNTER — Ambulatory Visit (INDEPENDENT_AMBULATORY_CARE_PROVIDER_SITE_OTHER): Payer: Medicare Other

## 2016-10-04 ENCOUNTER — Ambulatory Visit (INDEPENDENT_AMBULATORY_CARE_PROVIDER_SITE_OTHER): Payer: Medicare Other | Admitting: Internal Medicine

## 2016-10-04 DIAGNOSIS — R0789 Other chest pain: Secondary | ICD-10-CM | POA: Diagnosis not present

## 2016-10-04 DIAGNOSIS — Z794 Long term (current) use of insulin: Secondary | ICD-10-CM

## 2016-10-04 DIAGNOSIS — E1165 Type 2 diabetes mellitus with hyperglycemia: Secondary | ICD-10-CM | POA: Diagnosis not present

## 2016-10-04 DIAGNOSIS — I2510 Atherosclerotic heart disease of native coronary artery without angina pectoris: Secondary | ICD-10-CM

## 2016-10-04 DIAGNOSIS — E1121 Type 2 diabetes mellitus with diabetic nephropathy: Secondary | ICD-10-CM

## 2016-10-04 DIAGNOSIS — IMO0002 Reserved for concepts with insufficient information to code with codable children: Secondary | ICD-10-CM

## 2016-10-04 LAB — COMPREHENSIVE METABOLIC PANEL
ALT: 63 U/L — AB (ref 0–53)
AST: 75 U/L — ABNORMAL HIGH (ref 0–37)
Albumin: 4.7 g/dL (ref 3.5–5.2)
Alkaline Phosphatase: 58 U/L (ref 39–117)
BUN: 30 mg/dL — ABNORMAL HIGH (ref 6–23)
CALCIUM: 9.5 mg/dL (ref 8.4–10.5)
CHLORIDE: 93 meq/L — AB (ref 96–112)
CO2: 32 meq/L (ref 19–32)
CREATININE: 1.76 mg/dL — AB (ref 0.40–1.50)
GFR: 39.66 mL/min — ABNORMAL LOW (ref >60.00)
Glucose, Bld: 190 mg/dL — ABNORMAL HIGH (ref 70–99)
Potassium: 4.9 mEq/L (ref 3.5–5.1)
SODIUM: 133 meq/L — AB (ref 135–145)
Total Bilirubin: 1.1 mg/dL (ref 0.2–1.2)
Total Protein: 7.1 g/dL (ref 6.0–8.3)

## 2016-10-04 LAB — LIPID PANEL
CHOLESTEROL: 131 mg/dL (ref 0–200)
HDL: 58.7 mg/dL (ref >39.00)
LDL Cholesterol: 61 mg/dL (ref 0–99)
NONHDL: 71.94
Total CHOL/HDL Ratio: 2
Triglycerides: 53 mg/dL (ref 0.0–149.0)
VLDL: 10.6 mg/dL (ref 0.0–40.0)

## 2016-10-04 LAB — CK TOTAL AND CKMB (NOT AT ARMC)
CK, MB: 5.1 ng/mL — AB (ref 0.0–5.0)
RELATIVE INDEX: 3.3 (ref 0.0–4.0)
Total CK: 153 U/L (ref 7–232)

## 2016-10-04 LAB — MICROALBUMIN / CREATININE URINE RATIO
Creatinine,U: 106.2 mg/dL
Microalb Creat Ratio: 1.6 mg/g (ref 0.0–30.0)
Microalb, Ur: 1.7 mg/dL (ref 0.0–1.9)

## 2016-10-04 LAB — TROPONIN I: TNIDX: 0.01 ug/l (ref 0.00–0.06)

## 2016-10-04 LAB — HEMOGLOBIN A1C: Hgb A1c MFr Bld: 7.9 % — ABNORMAL HIGH (ref 4.6–6.5)

## 2016-10-04 LAB — LDL CHOLESTEROL, DIRECT: LDL DIRECT: 62 mg/dL

## 2016-10-04 MED ORDER — PANTOPRAZOLE SODIUM 40 MG PO TBEC: 40.0000 mg | DELAYED_RELEASE_TABLET | Freq: Two times a day (BID) | ORAL | 0 refills | Status: DC

## 2016-10-04 MED ORDER — NITROGLYCERIN 0.4 MG SL SUBL: 0.4000 mg | SUBLINGUAL_TABLET | SUBLINGUAL | 3 refills | Status: DC | PRN

## 2016-10-04 NOTE — Progress Notes (Signed)
Pre-visit discussion using our clinic review tool. No additional management support is needed unless otherwise documented below in the visit note.  

## 2016-10-04 NOTE — Telephone Encounter (Signed)
Nothing to do.

## 2016-10-04 NOTE — Progress Notes (Signed)
Subjective:  Patient ID: COUGAR IMEL, male    DOB: 1932/12/12  Age: 82 y.o. MRN: [id number]  CC: The encounter diagnosis was Other chest pain.  HPI Henry Lindsey presents for urgent evaluation  Patient presented to the [facility] for blood draw and reported recurrent episodes  of chest pain accompanied by regurgitation and cough productive of white sputum . Symptoms occurring at night when supine,  Started 8 weeks ago with the initiation of an oral chemo medication that he was prescribed by his dermatologist  for skin cancer.     He Stopped the oral  Chemo med for skin cancer  3 weeks ago due to intolerance (cramps, nausea). Despite eating dinner earlier at 3 pm and taking zofran prn nausea.  He denies chest pain with exertion and has been quite physically active.  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 He denies orthopnea and dyspnea,  Is not taking a PPI   ,Patient has a history of CAD, PAD with prior  Angioplasty,  Prior CVA,  And unconctrolled Type 2 DM  and GERD, and  sleeps on a wedge, which he removes by 3 am.    CAD history unclear as he refused to have a pharmacologic myoview in 2015 as preoperative clearance tool .   No facility-administered medications prior to visit.    Outpatient Medications Prior to Visit  Medication Sig Dispense Refill  . Alum & Mag Hydroxide-Simeth (MAGIC MOUTHWASH) SOLN Take 5 mLs by mouth 3 (three) times daily as needed for mouth pain.    [name] amLODipine (NORVASC) 10 MG tablet TAKE 1 TABLET BY MOUTH EVERY DAY 30 tablet 0  . aspirin EC 81 MG tablet Take 1 tablet (81 mg total) by mouth daily. 90 tablet 3  . atorvastatin (LIPITOR) 40 MG tablet TAKE 1 TABLET BY MOUTH EVERY DAY 90 tablet 2  . Blood Glucose Monitoring Suppl (ONE TOUCH ULTRA SYSTEM KIT) w/Device KIT 1 kit by Does not apply route once. Use DX code E11.59 1 each 0  . cilostazol (PLETAL) 100  MG tablet TAKE 1 TABLET(100 MG) BY MOUTH TWICE DAILY 60 tablet 3  . cyclobenzaprine (FLEXERIL) 5 MG tablet Take 1 tablet (5 mg total) by mouth 3 (three) times daily as needed for muscle spasms. 90 tablet 3  . famotidine (PEPCID) 20 MG tablet TAKE 1 TABLET(20 MG) BY MOUTH TWICE DAILY 60 tablet 5  . fenofibrate micronized (LOFIBRA) 134 MG capsule TAKE 1 CAPSULE BY MOUTH EVERY MORNING BEFORE BREAKFAST 30 capsule 0  . finasteride (PROSCAR) 5 MG tablet Take 1 tablet (5 mg total) by mouth daily. 30 tablet 2  . gabapentin (NEURONTIN) 400 MG capsule TAKE 1 CAPSULE(400 MG) BY MOUTH THREE TIMES DAILY 120 capsule 2  . glipiZIDE (GLUCOTROL) 10 MG tablet TAKE 1 TABLET(10 MG) BY MOUTH TWICE DAILY 60 tablet 4  . glipiZIDE (GLUCOTROL) 10 MG tablet TAKE 1 TABLET(10 MG) BY MOUTH TWICE DAILY 60 tablet 2  . glucose blood test strip Use as instructed three times daily 100 each 12  . HYDROcodone-acetaminophen (NORCO/VICODIN) 5-325 MG tablet One tablet two times daily as needed for knee pain 60 tablet 0  . INS SYRINGE/NEEDLE 1CC/28G (B-D INSULIN SYRINGE 1CC/28G) 28G X 1/2" 1 ML MISC USE TO ADMINISTER INSULIN DAILY 100 each 0  . insulin lispro protamine-lispro (HUMALOG MIX 75/25) (75-25) 100 UNIT/ML SUSP injection Inject 10 units right  before  breakfast and 6 units before dinner 10 mL 5  . INSULIN SYRINGE .5CC/29G 29G X 1/2" 0.5 ML MISC 1 Syringe by Does not apply route 3 (three) times daily. 100 each 6  . Insulin Syringe-Needle U-100 (INSULIN SYRINGE .5CC/31GX5/16") 31G X 5/16" 0.5 ML MISC USE THREE TIMES DAILY 100 each 5  . JANUVIA 100 MG tablet TAKE 1 TABLET(100 MG) BY MOUTH DAILY. NOTE DOSE INCREASE 30 tablet 3  . latanoprost (XALATAN) 0.005 % ophthalmic solution INSTILL 1 DROP INTO BOTH EYES QD  5  . losartan-hydrochlorothiazide (HYZAAR) 100-25 MG tablet TAKE 1 TABLET BY MOUTH EVERY DAY 90 tablet 0  . meloxicam (MOBIC) 15 MG tablet TAKE 1 TABLET(15 MG) BY MOUTH DAILY 30 tablet 5  . metFORMIN (GLUCOPHAGE) 1000 MG  tablet TAKE 1 TABLET BY MOUTH TWICE DAILY 180 tablet 1  . metoprolol succinate (TOPROL-XL) 100 MG 24 hr tablet TAKE 1 TABLET BY MOUTH ONCE DAILY 30 tablet 5  . ondansetron (ZOFRAN) 4 MG tablet Take 1 tablet (4 mg total) by mouth every 8 (eight) hours as needed for nausea or vomiting. 30 tablet 3  . ONETOUCH DELICA LANCETS 73S MISC Use three times daily to check blood sugar. 100 each 11  . tamsulosin (FLOMAX) 0.4 MG CAPS capsule Take 0.4 mg by mouth daily after breakfast.    . traMADol (ULTRAM) 50 MG tablet     . ERIVEDGE 150 MG capsule Take 1 tablet by mouth daily.      Review of Systems;  Patient denies headache, fevers, malaise, unintentional weight loss, skin rash, eye pain, sinus congestion and sinus pain, sore throat, dysphagia,  hemoptysis , cough, dyspnea, wheezing, , palpitations, orthopnea, edema, abdominal pain, nausea, melena, diarrhea, constipation, flank pain, dysuria, hematuria, urinary  Frequency, nocturia, numbness, tingling, seizures,  Focal weakness, Loss of consciousness,  Tremor, insomnia, depression, anxiety, and suicidal ideation.      Objective:  BP 122/74   Pulse (!) 107   Temp 98 F (36.7 C)   Resp 10   Ht '5\' 11"'  (1.803 m)   Wt 189 lb (85.7 kg)   SpO2 93%   BMI 26.36 kg/m   BP Readings from Last 3 Encounters:  10/05/16 128/70  10/04/16 122/74  09/03/16 124/60    Wt Readings from Last 3 Encounters:  10/05/16 190 lb (86.2 kg)  10/04/16 189 lb (85.7 kg)  09/03/16 193 lb (87.5 kg)    General appearance: alert, cooperative and appears stated age Ears: normal TM's and external ear canals both ears Throat: lips, mucosa, and tongue normal; teeth and gums normal Neck: no adenopathy, no carotid bruit, supple, symmetrical, trachea midline and thyroid not enlarged, symmetric, no tenderness/mass/nodules Back: symmetric, no curvature. ROM normal. No CVA tenderness. Lungs: clear to auscultation bilaterally Heart: regular rate and rhythm, S1, S2 normal, no  murmur, click, rub or gallop Abdomen: soft, non-tender; bowel sounds normal; no masses,  no organomegaly Pulses: 2+ and symmetric Skin: Skin color, texture, turgor normal. No rashes or lesions Lymph nodes: Cervical, supraclavicular, and axillary nodes normal.  Lab Results  Component Value Date   HGBA1C 7.9 (H) 10/04/2016   HGBA1C 8.1 (H) 07/01/2016   HGBA1C 8.7 (H) 02/12/2016    Lab Results  Component Value Date   CREATININE 1.81 (H) 10/05/2016   CREATININE 1.76 (H) 10/04/2016   CREATININE 1.48 09/16/2016    Lab Results  Component Value Date   WBC 20.9 (H) 10/05/2016   HGB 13.0 10/05/2016   HCT 39.2 (L) 10/05/2016  PLT 293 10/05/2016   GLUCOSE 177 (H) 10/05/2016   CHOL 131 10/04/2016   TRIG 53.0 10/04/2016   HDL 58.70 10/04/2016   LDLDIRECT 62.0 10/04/2016   LDLCALC 61 10/04/2016   ALT 131 (H) 10/05/2016   AST 130 (H) 10/05/2016   NA 129 (L) 10/05/2016   K 4.5 10/05/2016   CL 94 (L) 10/05/2016   CREATININE 1.81 (H) 10/05/2016   BUN 30 (H) 10/05/2016   CO2 26 10/05/2016   TSH 2.50 05/04/2015   PSA 0.83 10/10/2014   INR 1.0 08/17/2012   HGBA1C 7.9 (H) 10/04/2016   MICROALBUR 1.7 10/04/2016    Ct Abdomen Pelvis W Contrast  Result Date: 04/10/2016 CLINICAL DATA:  Left lower quadrant pain for 3 weeks, diarrhea EXAM: CT ABDOMEN AND PELVIS WITH CONTRAST TECHNIQUE: Multidetector CT imaging of the abdomen and pelvis was performed using the standard protocol following bolus administration of intravenous contrast. CONTRAST:  133m ISOVUE-300 IOPAMIDOL (ISOVUE-300) INJECTION 61% COMPARISON:  CT abdomen pelvis of 03/29/2012 FINDINGS: Minimal scarring is noted posteriorly in the left lower lobe. No infiltrate or effusion is seen. A tiny nodule posterior medially deep in the right lower lobe sulcus is stable. The liver is somewhat low in attenuation which may indicate mild fatty infiltration. Correlation with LFTs is recommended. There may be small gallstones layering within the  gallbladder. The pancreas is normal in size in the pancreatic duct is not dilated. There are a few calcifications in the head of the pancreas which may indicate mild chronic calcific pancreatitis. The adrenal glands and spleen are unremarkable and there is a splenic calcified granuloma present. Several low-attenuation rounded renal lesions are present bilaterally most consistent with cysts, somewhat more prominent than on the CT from 2013. No hydronephrosis is seen. On delayed images, the pelvocaliceal systems are unremarkable and the proximal ureters are normal in caliber. Significant atherosclerotic disease of the abdominal aorta is noted. The urinary bladder is unremarkable. The prostate is normal in size for age. Multiple rectosigmoid colon and descending colon diverticula are present. There is no present evidence of diverticulitis. There is fluid throughout the colon which is consistent with the patient's history of diarrhea. The terminal ileum and the appendix are unremarkable. The 7 mm anterolisthesis of L4 on L5 which appears to be due to degenerative change involving the facet joints of the lower lumbar spine. Degenerative disc disease is noted most marked at L4-5 and L5-S1 levels. IMPRESSION: 1. No explanation for the patient's left lower quadrant pain is seen. There are multiple rectosigmoid and descending colon diverticula, but no diverticulitis is seen. 2. No renal or ureteral calculi are noted. 3. 7 mm anterolisthesis of L4 on L5 due to degenerative change of the facet joints. Degenerative disc disease at L4-5 and L5-S1. 4. Low-attenuation of the liver may indicate fatty infiltration. Correlate with LFTs. 5. Question small gallstones layering within the gallbladder. Electronically Signed   By: PIvar DrapeM.D.   On: 04/10/2016 13:26    Assessment & Plan:   Problem List Items Addressed This Visit    Chest pain    Noncardiac sounding based on history,  More suggestive of esophagitis  However he  has a history of  CAD.  Troponin in=s negative,  ekg no acute changes,  Will treat for GERD esophagitis,  And have him follow up with cardiology for stress testing.  ntg sl  rx given.       Relevant Orders   EKG 12-Lead (Completed)   DG Chest 2 View (  Completed)   CK total and CKMB (cardiac)not at [facility] (Completed)   Troponin I (Completed)      I am having Mr. Mott start on pantoprazole and nitroGLYCERIN. I am also having him maintain his INSULIN SYRINGE .5CC/29G, INSULIN SYRINGE .5CC/31GX5/16", metoprolol succinate, aspirin EC, traMADol, tamsulosin, magic mouthwash, finasteride, ONE TOUCH ULTRA SYSTEM KIT, ONETOUCH DELICA LANCETS 86L, latanoprost, INS SYRINGE/NEEDLE 1CC/28G, meloxicam, glipiZIDE, glucose blood, insulin lispro protamine-lispro, famotidine, atorvastatin, metFORMIN, JANUVIA, cilostazol, fenofibrate micronized, ERIVEDGE, ondansetron, HYDROcodone-acetaminophen, gabapentin, glipiZIDE, cyclobenzaprine, amLODipine, and losartan-hydrochlorothiazide.  Meds ordered this encounter  Medications  . pantoprazole (PROTONIX) 40 MG tablet    Sig: Take 1 tablet (40 mg total) by mouth 2 (two) times daily before a meal.    Dispense:  60 tablet    Refill:  0  . nitroGLYCERIN (NITROSTAT) 0.4 MG SL tablet    Sig: Place 1 tablet (0.4 mg total) under the tongue every 5 (five) minutes as needed for chest pain. MAXIMUM 3 TABLETS    Dispense:  50 tablet    Refill:  3    There are no discontinued medications.  Follow-up: No Follow-up on file.   [name], MD

## 2016-10-04 NOTE — Telephone Encounter (Signed)
[facility] called with alert high result on CK, MB of 5.1.

## 2016-10-04 NOTE — Patient Instructions (Addendum)
I am prescribing a medication for Reflux. To take twice daily ;  In the morning before breakfast,  And 1 hour before dinner  I am also giving  you nitroglycerin sublingual tablets to use the next time you have chest pain

## 2016-10-05 ENCOUNTER — Emergency Department
Admission: EM | Admit: 2016-10-05 | Discharge: 2016-10-05 | Disposition: A | Discharge status: Other Acute Inpatient Hospital | Payer: Medicare Other | Attending: Emergency Medicine | Admitting: Emergency Medicine

## 2016-10-05 ENCOUNTER — Encounter (HOSPITAL_COMMUNITY): Payer: Self-pay | Admitting: Anesthesiology

## 2016-10-05 ENCOUNTER — Encounter: Payer: Self-pay | Admitting: Emergency Medicine

## 2016-10-05 ENCOUNTER — Emergency Department: Payer: Medicare Other

## 2016-10-05 ENCOUNTER — Inpatient Hospital Stay (HOSPITAL_COMMUNITY)
Admission: AD | Admit: 2016-10-05 | Discharge: 2016-10-08 | DRG: 871 | Disposition: A | Discharge status: Home Health | Payer: Medicare Other | Source: Ambulatory Visit | Attending: Internal Medicine | Admitting: Internal Medicine

## 2016-10-05 DIAGNOSIS — R7989 Other specified abnormal findings of blood chemistry: Secondary | ICD-10-CM | POA: Diagnosis present

## 2016-10-05 DIAGNOSIS — Z9861 Coronary angioplasty status: Secondary | ICD-10-CM

## 2016-10-05 DIAGNOSIS — Z8673 Personal history of transient ischemic attack (TIA), and cerebral infarction without residual deficits: Secondary | ICD-10-CM

## 2016-10-05 DIAGNOSIS — E1165 Type 2 diabetes mellitus with hyperglycemia: Secondary | ICD-10-CM | POA: Diagnosis present

## 2016-10-05 DIAGNOSIS — E785 Hyperlipidemia, unspecified: Secondary | ICD-10-CM

## 2016-10-05 DIAGNOSIS — I129 Hypertensive chronic kidney disease with stage 1 through stage 4 chronic kidney disease, or unspecified chronic kidney disease: Secondary | ICD-10-CM | POA: Diagnosis present

## 2016-10-05 DIAGNOSIS — A419 Sepsis, unspecified organism: Secondary | ICD-10-CM | POA: Diagnosis present

## 2016-10-05 DIAGNOSIS — E784 Other hyperlipidemia: Secondary | ICD-10-CM | POA: Diagnosis not present

## 2016-10-05 DIAGNOSIS — Z79899 Other long term (current) drug therapy: Secondary | ICD-10-CM

## 2016-10-05 DIAGNOSIS — E1151 Type 2 diabetes mellitus with diabetic peripheral angiopathy without gangrene: Secondary | ICD-10-CM | POA: Diagnosis present

## 2016-10-05 DIAGNOSIS — Z87891 Personal history of nicotine dependence: Secondary | ICD-10-CM

## 2016-10-05 DIAGNOSIS — I2510 Atherosclerotic heart disease of native coronary artery without angina pectoris: Secondary | ICD-10-CM | POA: Insufficient documentation

## 2016-10-05 DIAGNOSIS — E1122 Type 2 diabetes mellitus with diabetic chronic kidney disease: Secondary | ICD-10-CM | POA: Diagnosis present

## 2016-10-05 DIAGNOSIS — K828 Other specified diseases of gallbladder: Secondary | ICD-10-CM | POA: Diagnosis present

## 2016-10-05 DIAGNOSIS — K819 Cholecystitis, unspecified: Secondary | ICD-10-CM

## 2016-10-05 DIAGNOSIS — J9811 Atelectasis: Secondary | ICD-10-CM | POA: Diagnosis present

## 2016-10-05 DIAGNOSIS — E1142 Type 2 diabetes mellitus with diabetic polyneuropathy: Secondary | ICD-10-CM | POA: Diagnosis present

## 2016-10-05 DIAGNOSIS — J479 Bronchiectasis, uncomplicated: Secondary | ICD-10-CM | POA: Diagnosis present

## 2016-10-05 DIAGNOSIS — E1121 Type 2 diabetes mellitus with diabetic nephropathy: Secondary | ICD-10-CM

## 2016-10-05 DIAGNOSIS — N179 Acute kidney failure, unspecified: Secondary | ICD-10-CM | POA: Diagnosis present

## 2016-10-05 DIAGNOSIS — R1011 Right upper quadrant pain: Secondary | ICD-10-CM | POA: Diagnosis present

## 2016-10-05 DIAGNOSIS — C4492 Squamous cell carcinoma of skin, unspecified: Secondary | ICD-10-CM | POA: Diagnosis present

## 2016-10-05 DIAGNOSIS — Z794 Long term (current) use of insulin: Secondary | ICD-10-CM | POA: Diagnosis not present

## 2016-10-05 DIAGNOSIS — E559 Vitamin D deficiency, unspecified: Secondary | ICD-10-CM | POA: Diagnosis not present

## 2016-10-05 DIAGNOSIS — N400 Enlarged prostate without lower urinary tract symptoms: Secondary | ICD-10-CM | POA: Diagnosis present

## 2016-10-05 DIAGNOSIS — N183 Chronic kidney disease, stage 3 (moderate): Secondary | ICD-10-CM | POA: Diagnosis present

## 2016-10-05 DIAGNOSIS — K8050 Calculus of bile duct without cholangitis or cholecystitis without obstruction: Secondary | ICD-10-CM

## 2016-10-05 DIAGNOSIS — E1140 Type 2 diabetes mellitus with diabetic neuropathy, unspecified: Secondary | ICD-10-CM | POA: Insufficient documentation

## 2016-10-05 DIAGNOSIS — J9601 Acute respiratory failure with hypoxia: Secondary | ICD-10-CM | POA: Diagnosis present

## 2016-10-05 DIAGNOSIS — Z9221 Personal history of antineoplastic chemotherapy: Secondary | ICD-10-CM

## 2016-10-05 DIAGNOSIS — I739 Peripheral vascular disease, unspecified: Secondary | ICD-10-CM | POA: Diagnosis present

## 2016-10-05 DIAGNOSIS — K810 Acute cholecystitis: Secondary | ICD-10-CM

## 2016-10-05 DIAGNOSIS — IMO0002 Reserved for concepts with insufficient information to code with codable children: Secondary | ICD-10-CM

## 2016-10-05 DIAGNOSIS — R6520 Severe sepsis without septic shock: Secondary | ICD-10-CM | POA: Diagnosis present

## 2016-10-05 DIAGNOSIS — R079 Chest pain, unspecified: Secondary | ICD-10-CM | POA: Insufficient documentation

## 2016-10-05 DIAGNOSIS — K8062 Calculus of gallbladder and bile duct with acute cholecystitis without obstruction: Secondary | ICD-10-CM | POA: Diagnosis present

## 2016-10-05 DIAGNOSIS — Z7984 Long term (current) use of oral hypoglycemic drugs: Secondary | ICD-10-CM

## 2016-10-05 DIAGNOSIS — R05 Cough: Secondary | ICD-10-CM | POA: Diagnosis not present

## 2016-10-05 DIAGNOSIS — H409 Unspecified glaucoma: Secondary | ICD-10-CM | POA: Diagnosis not present

## 2016-10-05 DIAGNOSIS — I10 Essential (primary) hypertension: Secondary | ICD-10-CM | POA: Insufficient documentation

## 2016-10-05 DIAGNOSIS — Z7982 Long term (current) use of aspirin: Secondary | ICD-10-CM | POA: Diagnosis not present

## 2016-10-05 DIAGNOSIS — Z833 Family history of diabetes mellitus: Secondary | ICD-10-CM

## 2016-10-05 DIAGNOSIS — R000 Tachycardia, unspecified: Secondary | ICD-10-CM | POA: Diagnosis present

## 2016-10-05 DIAGNOSIS — Z96652 Presence of left artificial knee joint: Secondary | ICD-10-CM | POA: Diagnosis present

## 2016-10-05 HISTORY — DX: Benign prostatic hyperplasia without lower urinary tract symptoms: N40.0

## 2016-10-05 LAB — GLUCOSE, CAPILLARY
GLUCOSE-CAPILLARY: 109 mg/dL — AB (ref 65–99)
Glucose-Capillary: 143 mg/dL — ABNORMAL HIGH (ref 65–99)
Glucose-Capillary: 155 mg/dL — ABNORMAL HIGH (ref 65–99)

## 2016-10-05 LAB — URINALYSIS, ROUTINE W REFLEX MICROSCOPIC
BACTERIA UA: NONE SEEN
Bilirubin Urine: NEGATIVE
Glucose, UA: 150 mg/dL — AB
KETONES UR: NEGATIVE mg/dL
LEUKOCYTES UA: NEGATIVE
Nitrite: NEGATIVE
PROTEIN: NEGATIVE mg/dL
SQUAMOUS EPITHELIAL / LPF: NONE SEEN
Specific Gravity, Urine: 1.012 (ref 1.005–1.030)
pH: 7 (ref 5.0–8.0)

## 2016-10-05 LAB — CBC
HCT: 39.2 % — ABNORMAL LOW (ref 40.0–52.0)
Hemoglobin: 13 g/dL (ref 13.0–18.0)
MCH: 28.2 pg (ref 26.0–34.0)
MCHC: 33 g/dL (ref 32.0–36.0)
MCV: 85.5 fL (ref 80.0–100.0)
Platelets: 293 K/uL (ref 150–440)
RBC: 4.59 MIL/uL (ref 4.40–5.90)
RDW: 14 % (ref 11.5–14.5)
WBC: 20.9 K/uL — ABNORMAL HIGH (ref 3.8–10.6)

## 2016-10-05 LAB — COMPREHENSIVE METABOLIC PANEL
ALBUMIN: 4 g/dL (ref 3.5–5.0)
ALT: 131 U/L — ABNORMAL HIGH (ref 17–63)
ANION GAP: 9 (ref 5–15)
AST: 130 U/L — ABNORMAL HIGH (ref 15–41)
Alkaline Phosphatase: 59 U/L (ref 38–126)
BUN: 30 mg/dL — ABNORMAL HIGH (ref 6–20)
CALCIUM: 8.9 mg/dL (ref 8.9–10.3)
CHLORIDE: 94 mmol/L — AB (ref 101–111)
CO2: 26 mmol/L (ref 22–32)
Creatinine, Ser: 1.81 mg/dL — ABNORMAL HIGH (ref 0.61–1.24)
GFR calc non Af Amer: 33 mL/min — ABNORMAL LOW (ref >60)
GFR, EST AFRICAN AMERICAN: 39 mL/min — AB (ref >60)
GLUCOSE: 177 mg/dL — AB (ref 65–99)
POTASSIUM: 4.5 mmol/L (ref 3.5–5.1)
SODIUM: 129 mmol/L — AB (ref 135–145)
Total Bilirubin: 1.7 mg/dL — ABNORMAL HIGH (ref 0.3–1.2)
Total Protein: 7.2 g/dL (ref 6.5–8.1)

## 2016-10-05 LAB — URINALYSIS, COMPLETE (UACMP) WITH MICROSCOPIC
Bacteria, UA: NONE SEEN
Bilirubin Urine: NEGATIVE
Glucose, UA: 50 mg/dL — AB
Hgb urine dipstick: NEGATIVE
Ketones, ur: NEGATIVE mg/dL
Leukocytes, UA: NEGATIVE
Nitrite: NEGATIVE
Protein, ur: NEGATIVE mg/dL
Specific Gravity, Urine: 1.011 (ref 1.005–1.030)
Squamous Epithelial / HPF: NONE SEEN
WBC, UA: NONE SEEN WBC/hpf (ref 0–5)
pH: 7 (ref 5.0–8.0)

## 2016-10-05 LAB — SURGICAL PCR SCREEN
MRSA, PCR: NEGATIVE
Staphylococcus aureus: NEGATIVE

## 2016-10-05 LAB — LIPASE, BLOOD: Lipase: <10 U/L — ABNORMAL LOW (ref 11–51)

## 2016-10-05 LAB — INFLUENZA PANEL BY PCR (TYPE A & B)
INFLAPCR: NEGATIVE
Influenza B By PCR: NEGATIVE

## 2016-10-05 LAB — LACTIC ACID, PLASMA: Lactic Acid, Venous: 1 mmol/L (ref 0.5–1.9)

## 2016-10-05 MED ORDER — FINASTERIDE 5 MG PO TABS
5.0000 mg | ORAL_TABLET | Freq: Every day | ORAL | Status: DC
  Administered 2016-10-06 – 2016-10-08 (×3): 5 mg via ORAL
  Filled 2016-10-05 (×3): qty 1

## 2016-10-05 MED ORDER — IOPAMIDOL (ISOVUE-300) INJECTION 61%
100.0000 mL | Freq: Once | INTRAVENOUS | Status: AC | PRN
  Administered 2016-10-05: 80 mL via INTRAVENOUS

## 2016-10-05 MED ORDER — FENTANYL CITRATE (PF) 100 MCG/2ML IJ SOLN
50.0000 ug | Freq: Once | INTRAMUSCULAR | Status: AC
  Administered 2016-10-05: 50 ug via INTRAVENOUS
  Filled 2016-10-05: qty 2

## 2016-10-05 MED ORDER — CILOSTAZOL 50 MG PO TABS
50.0000 mg | ORAL_TABLET | Freq: Two times a day (BID) | ORAL | Status: DC
  Administered 2016-10-06 – 2016-10-08 (×5): 50 mg via ORAL
  Filled 2016-10-05 (×5): qty 1

## 2016-10-05 MED ORDER — ONDANSETRON HCL 4 MG PO TABS: 4.0000 mg | ORAL_TABLET | Freq: Four times a day (QID) | ORAL | Status: DC | PRN

## 2016-10-05 MED ORDER — HYDROCODONE-ACETAMINOPHEN 5-325 MG PO TABS
1.0000 | ORAL_TABLET | ORAL | Status: DC | PRN
  Administered 2016-10-06: 1 via ORAL
  Filled 2016-10-05: qty 1

## 2016-10-05 MED ORDER — ACETAMINOPHEN 325 MG PO TABS: 650.0000 mg | ORAL_TABLET | Freq: Four times a day (QID) | ORAL | Status: DC | PRN

## 2016-10-05 MED ORDER — PANTOPRAZOLE SODIUM 40 MG PO TBEC
40.0000 mg | DELAYED_RELEASE_TABLET | Freq: Two times a day (BID) | ORAL | Status: DC
  Administered 2016-10-06 – 2016-10-08 (×5): 40 mg via ORAL
  Filled 2016-10-05 (×5): qty 1

## 2016-10-05 MED ORDER — ACETAMINOPHEN 650 MG RE SUPP: 650.0000 mg | Freq: Four times a day (QID) | RECTAL | Status: DC | PRN

## 2016-10-05 MED ORDER — AMLODIPINE BESYLATE 10 MG PO TABS
10.0000 mg | ORAL_TABLET | Freq: Every day | ORAL | Status: DC
  Administered 2016-10-06: 10 mg via ORAL
  Filled 2016-10-05: qty 1

## 2016-10-05 MED ORDER — INDOMETHACIN 50 MG RE SUPP
100.0000 mg | RECTAL | Status: DC
  Filled 2016-10-05: qty 2

## 2016-10-05 MED ORDER — ONDANSETRON HCL 4 MG/2ML IJ SOLN
4.0000 mg | Freq: Four times a day (QID) | INTRAMUSCULAR | Status: DC | PRN
  Administered 2016-10-06: 4 mg via INTRAVENOUS
  Filled 2016-10-05: qty 2

## 2016-10-05 MED ORDER — ASPIRIN EC 81 MG PO TBEC
81.0000 mg | DELAYED_RELEASE_TABLET | Freq: Every day | ORAL | Status: DC
  Administered 2016-10-06 – 2016-10-08 (×3): 81 mg via ORAL
  Filled 2016-10-05 (×3): qty 1

## 2016-10-05 MED ORDER — SODIUM CHLORIDE 0.9 % IV SOLN: INTRAVENOUS | Status: DC

## 2016-10-05 MED ORDER — TAMSULOSIN HCL 0.4 MG PO CAPS
0.4000 mg | ORAL_CAPSULE | Freq: Every day | ORAL | Status: DC
  Administered 2016-10-06 – 2016-10-08 (×3): 0.4 mg via ORAL
  Filled 2016-10-05 (×3): qty 1

## 2016-10-05 MED ORDER — HYDROCHLOROTHIAZIDE 25 MG PO TABS
25.0000 mg | ORAL_TABLET | Freq: Every day | ORAL | Status: DC
  Administered 2016-10-06: 25 mg via ORAL
  Filled 2016-10-05: qty 1

## 2016-10-05 MED ORDER — PIPERACILLIN-TAZOBACTAM 3.375 G IVPB
3.3750 g | Freq: Three times a day (TID) | INTRAVENOUS | Status: DC
  Filled 2016-10-05 (×2): qty 50

## 2016-10-05 MED ORDER — PIPERACILLIN-TAZOBACTAM 3.375 G IVPB 30 MIN
3.3750 g | Freq: Once | INTRAVENOUS | Status: AC
  Administered 2016-10-05: 3.375 g via INTRAVENOUS
  Filled 2016-10-05: qty 50

## 2016-10-05 MED ORDER — MORPHINE SULFATE (PF) 2 MG/ML IV SOLN
2.0000 mg | INTRAVENOUS | Status: DC | PRN
  Administered 2016-10-06 – 2016-10-07 (×3): 2 mg via INTRAVENOUS
  Filled 2016-10-05 (×3): qty 1

## 2016-10-05 MED ORDER — PIPERACILLIN-TAZOBACTAM 3.375 G IVPB
3.3750 g | Freq: Three times a day (TID) | INTRAVENOUS | Status: DC
  Administered 2016-10-05 – 2016-10-08 (×9): 3.375 g via INTRAVENOUS
  Filled 2016-10-05 (×13): qty 50

## 2016-10-05 MED ORDER — SODIUM CHLORIDE 0.9% FLUSH
3.0000 mL | Freq: Two times a day (BID) | INTRAVENOUS | Status: DC
  Administered 2016-10-05 – 2016-10-07 (×5): 3 mL via INTRAVENOUS

## 2016-10-05 MED ORDER — MAGNESIUM CITRATE PO SOLN
1.0000 | Freq: Once | ORAL | Status: DC | PRN
  Filled 2016-10-05: qty 296

## 2016-10-05 MED ORDER — IOPAMIDOL (ISOVUE-300) INJECTION 61%
30.0000 mL | Freq: Once | INTRAVENOUS | Status: AC | PRN
  Administered 2016-10-05: 30 mL via ORAL

## 2016-10-05 MED ORDER — INSULIN ASPART 100 UNIT/ML ~~LOC~~ SOLN
0.0000 [IU] | Freq: Three times a day (TID) | SUBCUTANEOUS | Status: DC
  Administered 2016-10-05: 2 [IU] via SUBCUTANEOUS
  Administered 2016-10-06: 5 [IU] via SUBCUTANEOUS
  Administered 2016-10-07: 2 [IU] via SUBCUTANEOUS
  Administered 2016-10-07: 1 [IU] via SUBCUTANEOUS
  Administered 2016-10-07: 3 [IU] via SUBCUTANEOUS
  Administered 2016-10-08 (×2): 2 [IU] via SUBCUTANEOUS

## 2016-10-05 MED ORDER — GABAPENTIN 100 MG PO CAPS
100.0000 mg | ORAL_CAPSULE | Freq: Three times a day (TID) | ORAL | Status: DC
  Administered 2016-10-06 – 2016-10-08 (×7): 100 mg via ORAL
  Filled 2016-10-05 (×7): qty 1

## 2016-10-05 MED ORDER — BISACODYL 10 MG RE SUPP: 10.0000 mg | Freq: Every day | RECTAL | Status: DC | PRN

## 2016-10-05 MED ORDER — MORPHINE SULFATE (PF) 4 MG/ML IV SOLN
2.0000 mg | Freq: Once | INTRAVENOUS | Status: AC
  Administered 2016-10-05: 2 mg via INTRAVENOUS
  Filled 2016-10-05: qty 1

## 2016-10-05 MED ORDER — SODIUM CHLORIDE 0.9 % IV BOLUS (SEPSIS)
1000.0000 mL | Freq: Once | INTRAVENOUS | Status: AC
Start: 2016-10-05 — End: 2016-10-05
  Administered 2016-10-05: 1000 mL via INTRAVENOUS

## 2016-10-05 MED ORDER — SODIUM CHLORIDE 0.9 % IV BOLUS (SEPSIS)
1000.0000 mL | Freq: Once | INTRAVENOUS | Status: AC
  Administered 2016-10-05: 1000 mL via INTRAVENOUS

## 2016-10-05 MED ORDER — LOSARTAN POTASSIUM 50 MG PO TABS
100.0000 mg | ORAL_TABLET | Freq: Every day | ORAL | Status: DC
  Administered 2016-10-06: 100 mg via ORAL
  Filled 2016-10-05: qty 2

## 2016-10-05 MED ORDER — HYDRALAZINE HCL 20 MG/ML IJ SOLN: 5.0000 mg | Freq: Three times a day (TID) | INTRAMUSCULAR | Status: DC | PRN

## 2016-10-05 MED ORDER — TRAZODONE HCL 50 MG PO TABS: 25.0000 mg | ORAL_TABLET | Freq: Every evening | ORAL | Status: DC | PRN

## 2016-10-05 MED ORDER — VANCOMYCIN HCL IN DEXTROSE 1-5 GM/200ML-% IV SOLN
1000.0000 mg | Freq: Once | INTRAVENOUS | Status: AC
  Administered 2016-10-05: 1000 mg via INTRAVENOUS
  Filled 2016-10-05: qty 200

## 2016-10-05 MED ORDER — LATANOPROST 0.005 % OP SOLN
1.0000 [drp] | Freq: Every day | OPHTHALMIC | Status: DC
  Administered 2016-10-05 – 2016-10-07 (×3): 1 [drp] via OPHTHALMIC
  Filled 2016-10-05: qty 2.5

## 2016-10-05 MED ORDER — LOSARTAN POTASSIUM-HCTZ 100-25 MG PO TABS: 1.0000 | ORAL_TABLET | Freq: Every day | ORAL | Status: DC

## 2016-10-05 MED ORDER — SODIUM CHLORIDE 0.9 % IV SOLN
INTRAVENOUS | Status: DC
  Administered 2016-10-06 – 2016-10-08 (×2): via INTRAVENOUS

## 2016-10-05 MED ORDER — INDOMETHACIN 50 MG RE SUPP
100.0000 mg | Freq: Once | RECTAL | Status: DC
  Filled 2016-10-05: qty 2

## 2016-10-05 MED ORDER — METOPROLOL SUCCINATE ER 100 MG PO TB24
100.0000 mg | ORAL_TABLET | Freq: Every day | ORAL | Status: DC
  Administered 2016-10-06: 100 mg via ORAL
  Filled 2016-10-05: qty 1

## 2016-10-05 MED ORDER — ATORVASTATIN CALCIUM 40 MG PO TABS
40.0000 mg | ORAL_TABLET | Freq: Every day | ORAL | Status: DC
  Administered 2016-10-06: 40 mg via ORAL
  Filled 2016-10-05: qty 1

## 2016-10-05 MED ORDER — SENNOSIDES-DOCUSATE SODIUM 8.6-50 MG PO TABS: 1.0000 | ORAL_TABLET | Freq: Every evening | ORAL | Status: DC | PRN

## 2016-10-05 MED ORDER — SODIUM CHLORIDE 0.9 % IV SOLN: Freq: Once | INTRAVENOUS | Status: DC

## 2016-10-05 MED ORDER — ONDANSETRON 4 MG PO TBDP
4.0000 mg | ORAL_TABLET | Freq: Once | ORAL | Status: AC
Start: 2016-10-05 — End: 2016-10-05
  Administered 2016-10-05: 4 mg via ORAL
  Filled 2016-10-05: qty 1

## 2016-10-05 NOTE — ED Notes (Signed)
[facility] here to lead patient, Henry Lindsey updated patient getting ready to transfer.

## 2016-10-05 NOTE — ED Notes (Signed)
Spoke with dr. Owens Shark regarding pt's presentation, tachycardia and abd pain. md to see pt for further orders.

## 2016-10-05 NOTE — Assessment & Plan Note (Signed)
Noncardiac sounding based on history,  More suggestive of esophagitis  However he has a history of  CAD.  Troponin in=s negative,  ekg no acute changes,  Will treat for GERD esophagitis,  And have him follow up with cardiology for stress testing.  ntg sl  rx given.

## 2016-10-05 NOTE — ED Notes (Signed)
Pt states has had emesis for approx 8 weeks nightly after starting a medication for his basal cell carcinoma. Pt states over last 2 days vomiting has increased. Pt states began to have rlq pain approx 3 days ago and saw his pmd for same. pmd obtained a chest xray but pt does not have results. Pt does appear to have swelling noted to rlq and ruq while in recumbant position. Pt states pain is worse when he bends over or moves.

## 2016-10-05 NOTE — H&P (Signed)
History and Physical    Henry Lindsey ASN:[id number] DOB: 1933-01-16 DOA: 10/05/2016   PCP: [name], MD   Patient coming from:  Home   Chief Complaint: RUQ pain   HPI: Henry Lindsey is a 82 y.o. male with a history of hypertension, hyperlipidemia, PVD , prior CVA, basal cell carcinoma of his nose, diabetes, who presented with exquisite right upper quadrant pain to [facility] . He had priorly evaluated by Dr. Derrel Nip (PCP ) who was to continue workups as OP, but symptoms were severe and he sought care at the ED.  A CT had shown acute cholecystitis, likely the bile duct stone. He received Vanc and Zosyn , and because of ERCP requirement, he was transferred from [facility] to [facility] for further evaluation and management of his symptoms. Pain is not agdravated but taking deep breath but worse postprandially. He reports intermittent nausea and vomiting. He originally beilieved these symptoms were related to a medicine taken recently for [facility] but symptoms continued.  No diarrhea or constipation, no GIB, No history of GI malignancy. Denies fevers or chills. Able to pass gas. Denies shortness of breath or cough  No recent trips or food poisoning. No changes in medication or over the counter products. No sick contacts. Denies a history of hepatitis. He does smoke. No ETOH or recreational drug use.     ED Course:  There were no vitals taken for this visit.   WBC 20.9 hemoglobin 13 platelets 293 sodium 133 bicarbonate 32 creatinine 1.76 glucose 190 lactic acid 1.0 EKG sinus tachycardia, without ACS. Cultures are pending. Influenza negative  CT Chest: Underlying emphysema. Underlying bronchiectasis. Scarring in the lower lungs, probably with some atelectasis in the right lower lobe. Extensive coronary artery calcification. Extensive aortic atherosclerosis CT of the abdomen and pelvis as above,  with  acute cholecystitis. The gallbladder is markedly distended and there is surrounding  inflammatory change. At least a few small stones dependent in the gallbladder. High suspicion of common duct stone or stones. and diverticulosis without diverticulitis   Review of Systems: As per HPI otherwise 10 point review of systems negative.   Past Medical History:  Diagnosis Date  . Carotid artery stenosis Feb 2012   <50% bilaterally  . CVA (cerebral infarction) feb 2012   r thalamic lacunar  . Diabetes mellitus   . Hyperlipidemia   . Hypertension   . Neuromuscular disorder ([place])   . Peripheral vascular disease in diabetes mellitus [facility]) Sept 2013    95% occlusion s/p PTCA R SFA Dew Sept 2013    Past Surgical History:  Procedure Laterality Date  . JOINT REPLACEMENT Left 2014   left knee  . UPPER GASTROINTESTINAL ENDOSCOPY      Social History Social History   Social History  . Marital status: Married    Spouse name: Enid Derry, deceased  . Number of children: 2  . Years of education: 12   Occupational History  .  Retired   Social History Main Topics  . Smoking status: Former Smoker    Quit date: 04/21/1968  . Smokeless tobacco: Never Used     Comment: quit 1969  . Alcohol use No  . Drug use: No  . Sexual activity: Yes    Partners: Female   Other Topics Concern  . Not on file   Social History Narrative   Widowed in 2014; dating again      No Known Allergies  Family History  Problem Relation Age of Onset  .  Diabetes Mother       Prior to Admission medications   Medication Sig Start Date End Date Taking? Authorizing Provider  Alum & Mag Hydroxide-Simeth (MAGIC MOUTHWASH) SOLN Take 5 mLs by mouth 3 (three) times daily as needed for mouth pain.    Historical Provider, MD  amLODipine (NORVASC) 10 MG tablet TAKE 1 TABLET BY MOUTH EVERY DAY 09/26/16   [name], MD  aspirin EC 81 MG tablet Take 1 tablet (81 mg total) by mouth daily. 11/03/13   [name], MD  atorvastatin (LIPITOR) 40 MG tablet TAKE 1 TABLET BY MOUTH EVERY DAY 07/15/16   [name], MD  Blood Glucose Monitoring Suppl (ONE TOUCH ULTRA SYSTEM KIT) w/Device KIT 1 kit by Does not apply route once. Use DX code E11.59 10/18/15   [name], MD  cilostazol (PLETAL) 100 MG tablet TAKE 1 TABLET(100 MG) BY MOUTH TWICE DAILY 08/13/16   [name], MD  cyclobenzaprine (FLEXERIL) 5 MG tablet Take 1 tablet (5 mg total) by mouth 3 (three) times daily as needed for muscle spasms. 09/10/16   [name], MD  ERIVEDGE 150 MG capsule Take 1 tablet by mouth daily. 08/23/16   Historical Provider, MD  famotidine (PEPCID) 20 MG tablet TAKE 1 TABLET(20 MG) BY MOUTH TWICE DAILY 05/08/16   [name], MD  fenofibrate micronized (LOFIBRA) 134 MG capsule TAKE 1 CAPSULE BY MOUTH EVERY MORNING BEFORE BREAKFAST 08/28/16   [name], MD  finasteride (PROSCAR) 5 MG tablet Take 1 tablet (5 mg total) by mouth daily. 05/04/15   [name], MD  gabapentin (NEURONTIN) 400 MG capsule TAKE 1 CAPSULE(400 MG) BY MOUTH THREE TIMES DAILY 09/09/16   [name], MD  glipiZIDE (GLUCOTROL) 10 MG tablet TAKE 1 TABLET(10 MG) BY MOUTH TWICE DAILY 03/27/16   [name], MD  glucose blood test strip Use as instructed three times daily 05/03/16   [name], MD  HYDROcodone-acetaminophen (NORCO/VICODIN) 5-325 MG tablet One tablet two times daily as needed for knee pain 09/03/16   [name], MD  INS SYRINGE/NEEDLE 1CC/28G (B-D INSULIN SYRINGE 1CC/28G) 28G X 1/2" 1 ML MISC USE TO ADMINISTER INSULIN DAILY 02/13/16   [name], MD  insulin lispro protamine-lispro (HUMALOG MIX 75/25) (75-25) 100 UNIT/ML SUSP injection Inject 10 units right before  breakfast and 6 units before dinner 05/03/16   [name], MD  INSULIN SYRINGE .5CC/29G 29G X 1/2" 0.5 ML MISC 1 Syringe by Does not apply route 3 (three) times daily. 08/05/11   [name], MD  Insulin Syringe-Needle U-100 (INSULIN SYRINGE .5CC/31GX5/16") 31G X 5/16" 0.5 ML MISC USE THREE TIMES DAILY 03/18/13   [name], MD  JANUVIA 100  MG tablet TAKE 1 TABLET(100 MG) BY MOUTH DAILY. NOTE DOSE INCREASE 08/13/16   [name], MD  latanoprost (XALATAN) 0.005 % ophthalmic solution INSTILL 1 DROP INTO BOTH EYES QD 11/01/15   Historical Provider, MD  losartan-hydrochlorothiazide (HYZAAR) 100-25 MG tablet TAKE 1 TABLET BY MOUTH EVERY DAY 09/26/16   [name], MD  meloxicam (MOBIC) 15 MG tablet TAKE 1 TABLET(15 MG) BY MOUTH DAILY 03/15/16   [name], MD  metFORMIN (GLUCOPHAGE) 1000 MG tablet TAKE 1 TABLET BY MOUTH TWICE DAILY 07/26/16   [name], MD  metoprolol succinate (TOPROL-XL) 100 MG 24 hr tablet TAKE 1 TABLET BY MOUTH ONCE DAILY 09/03/13   [name], MD  nitroGLYCERIN (  NITROSTAT) 0.4 MG SL tablet Place 1 tablet (0.4 mg total) under the tongue every 5 (five) minutes as needed for chest pain. MAXIMUM 3 TABLETS 10/04/16   [name], MD  ondansetron (ZOFRAN) 4 MG tablet Take 1 tablet (4 mg total) by mouth every 8 (eight) hours as needed for nausea or vomiting. 09/03/16   [name], MD  [facility] DELICA LANCETS 26E MISC Use three times daily to check blood sugar. 10/18/15   [name], MD  pantoprazole (PROTONIX) 40 MG tablet Take 1 tablet (40 mg total) by mouth 2 (two) times daily before a meal. 10/04/16   [name], MD  tamsulosin (FLOMAX) 0.4 MG CAPS capsule Take 0.4 mg by mouth daily after breakfast.    Historical Provider, MD  traMADol (ULTRAM) 50 MG tablet Take 50 mg by mouth every 6 (six) hours as needed.  11/24/13   Historical Provider, MD    Physical Exam:    There were no vitals filed for this visit.   Constitutional: NAD, calm, comfortable   There were no vitals filed for this visit. Eyes: PERRL, lids and conjunctivae normal ENMT: Mucous membranes are moist. Posterior pharynx clear of any exudate or lesions.Normal dentition.  Neck: normal, supple, no masses, no thyromegaly Respiratory: clear to auscultation bilaterally, no wheezing, no crackles. Normal respiratory effort. No  accessory muscle use.  Cardiovascular: Regular rate and rhythm, no murmurs / rubs / gallops. No extremity edema. 2+ pedal pulses. No carotid bruits.  Abdomen: RUQ significant  Tenderness to palpation , no masses palpated. No hepatosplenomegaly. Bowel sounds positive.  Musculoskeletal: no clubbing / cyanosis. No joint deformity upper and lower extremities. Good ROM, no contractures. Normal muscle tone.  Skin: no rashes, lesions, ulcers.  Neurologic: CN 2-12 grossly intact. Sensation intact, DTR normal. Strength 5/5 in all 4.  Psychiatric: Normal judgment and insight. Alert and oriented x 3. Normal mood.     Labs on Admission: I have personally reviewed following labs and imaging studies  CBC:  Recent Labs Lab 10/05/16 0630  WBC 20.9*  HGB 13.0  HCT 39.2*  MCV 85.5  PLT 158    Basic Metabolic Panel:  Recent Labs Lab 10/04/16 0942 10/05/16 0630  NA 133* 129*  K 4.9 4.5  CL 93* 94*  CO2 32 26  GLUCOSE 190* 177*  BUN 30* 30*  CREATININE 1.76* 1.81*  CALCIUM 9.5 8.9    GFR: Estimated Creatinine Clearance: 34.1 mL/min (by C-G formula based on SCr of 1.81 mg/dL (H)).  Liver Function Tests:  Recent Labs Lab 10/04/16 0942 10/05/16 0630  AST 75* 130*  ALT 63* 131*  ALKPHOS 58 59  BILITOT 1.1 1.7*  PROT 7.1 7.2  ALBUMIN 4.7 4.0    Recent Labs Lab 10/05/16 0630  LIPASE <10*   No results for input(s): AMMONIA in the last 168 hours.  Coagulation Profile: No results for input(s): INR, PROTIME in the last 168 hours.  Cardiac Enzymes:  Recent Labs Lab 10/04/16 1052  CKTOTAL 153  CKMB 5.1*    BNP (last 3 results) No results for input(s): PROBNP in the last 8760 hours.  HbA1C:  Recent Labs  10/04/16 0942  HGBA1C 7.9*    CBG:  Recent Labs Lab 10/05/16 1110  GLUCAP 143*    Lipid Profile:  Recent Labs  10/04/16 0942  CHOL 131  HDL 58.70  LDLCALC 61  TRIG 53.0  CHOLHDL 2  LDLDIRECT 62.0    Thyroid Function Tests: No results for  input(s):  TSH, T4TOTAL, FREET4, T3FREE, THYROIDAB in the last 72 hours.  Anemia Panel: No results for input(s): VITAMINB12, FOLATE, FERRITIN, TIBC, IRON, RETICCTPCT in the last 72 hours.  Urine analysis:    Component Value Date/Time   COLORURINE YELLOW (A) 10/05/2016 0713   APPEARANCEUR CLEAR (A) 10/05/2016 0713   APPEARANCEUR Clear 08/17/2012 1505   LABSPEC 1.011 10/05/2016 0713   LABSPEC 1.017 08/17/2012 1505   PHURINE 7.0 10/05/2016 0713   GLUCOSEU 50 (A) 10/05/2016 0713   GLUCOSEU 50 mg/dL 08/17/2012 1505   HGBUR NEGATIVE 10/05/2016 0713   BILIRUBINUR NEGATIVE 10/05/2016 0713   BILIRUBINUR neg 10/07/2014 1444   BILIRUBINUR Negative 08/17/2012 1505   KETONESUR NEGATIVE 10/05/2016 0713   PROTEINUR NEGATIVE 10/05/2016 0713   UROBILINOGEN 0.2 10/07/2014 1444   NITRITE NEGATIVE 10/05/2016 0713   LEUKOCYTESUR NEGATIVE 10/05/2016 0713   LEUKOCYTESUR Negative 08/17/2012 1505    Sepsis Labs: '@LABRCNTIP' (procalcitonin:4,lacticidven:4) )No results found for this or any previous visit (from the past 240 hour(s)).   Radiological Exams on Admission: Dg Chest 2 View  Result Date: 10/04/2016 CLINICAL DATA:  Hypoxia, chest pain at night. EXAM: CHEST  2 VIEW COMPARISON:  08/07/2015 FINDINGS: Cardiomediastinal silhouette is normal. Mediastinal contours appear intact. Calcific atherosclerotic disease and tortuosity of the aorta are noted. There is no evidence of focal airspace consolidation, pleural effusion or pneumothorax. Osseous structures are without acute abnormality. Ankylosing changes of the thoracic spine are noted. Soft tissues are grossly normal. IMPRESSION: No active cardiopulmonary disease. Calcific atherosclerotic disease of the aorta. Electronically Signed   By: Fidela Salisbury M.D.   On: 10/04/2016 14:49   Ct Chest W Contrast  Result Date: 10/05/2016 CLINICAL DATA:  Nausea, vomiting and right-sided abdominal pain over the last 3 weeks. Hypoxia . EXAM: CT CHEST, ABDOMEN,  AND PELVIS WITH CONTRAST TECHNIQUE: Multidetector CT imaging of the chest, abdomen and pelvis was performed following the standard protocol during bolus administration of intravenous contrast. CONTRAST:  78m ISOVUE-300 IOPAMIDOL (ISOVUE-300) INJECTION 61% COMPARISON:  04/10/2016 FINDINGS: CT CHEST FINDINGS Cardiovascular: Heart size is normal. No pericardial fluid. Extensive coronary artery calcification. Aortic atherosclerotic calcification. No pulmonary arterial lesion seen, though opacity is not sufficient to rule out emboli. Mediastinum/Nodes: No mass or lymphadenopathy. Lungs/Pleura: Mild emphysema and scarring in the upper lobes. Linear markings in the lower lobes probably represent a combination of scarring and atelectasis, particularly in the right lower lobe. In general, the bronchi are large consistent with bronchiectasis. No pleural fluid or pleural lesion. Musculoskeletal: No thoracic fracture. Upper thoracic degenerative disc disease. Mid and lower thoracic ankylosis. CT ABDOMEN PELVIS FINDINGS Hepatobiliary: Liver parenchyma shows mild fatty change. The gallbladder is markedly distended and there is surrounding inflammatory change. There are at least diffuse small stones dependent in the gallbladder. Findings are worrisome for acute cholecystitis by CT. Likely 4-5 mm common duct stone. There could be other noncalcified or less calcified ductal stones as well. Pancreas: Negative Spleen: Normal Adrenals/Urinary Tract: Adrenal glands are normal. Multiple bilateral benign appearing renal cysts. Stomach/Bowel: No primary bowel pathology. Diverticulosis without evidence of diverticulitis. Vascular/Lymphatic: Aortic atherosclerosis, advanced. No aneurysm. IVC is normal. No retroperitoneal adenopathy. Reproductive: Negative Other: No ascites or free air. Musculoskeletal: Extensive chronic degenerative changes of the lumbar spine including 8 mm anterolisthesis L4-5. IMPRESSION: Chest: Underlying emphysema.  Underlying bronchiectasis. Scarring in the lower lungs, probably with some atelectasis in the right lower lobe. Extensive coronary artery calcification. Extensive aortic atherosclerosis. Abdomen: Probable acute cholecystitis. The gallbladder is markedly distended and there is surrounding inflammatory change.  At least a few small stones dependent in the gallbladder. High suspicion of common duct stone or stones. Advanced aortic atherosclerosis. Diverticulosis without evidence of diverticulitis. Electronically Signed   By: Nelson Chimes M.D.   On: 10/05/2016 09:14   Ct Abdomen Pelvis W Contrast  Result Date: 10/05/2016 CLINICAL DATA:  Nausea, vomiting and right-sided abdominal pain over the last 3 weeks. Hypoxia . EXAM: CT CHEST, ABDOMEN, AND PELVIS WITH CONTRAST TECHNIQUE: Multidetector CT imaging of the chest, abdomen and pelvis was performed following the standard protocol during bolus administration of intravenous contrast. CONTRAST:  71m ISOVUE-300 IOPAMIDOL (ISOVUE-300) INJECTION 61% COMPARISON:  04/10/2016 FINDINGS: CT CHEST FINDINGS Cardiovascular: Heart size is normal. No pericardial fluid. Extensive coronary artery calcification. Aortic atherosclerotic calcification. No pulmonary arterial lesion seen, though opacity is not sufficient to rule out emboli. Mediastinum/Nodes: No mass or lymphadenopathy. Lungs/Pleura: Mild emphysema and scarring in the upper lobes. Linear markings in the lower lobes probably represent a combination of scarring and atelectasis, particularly in the right lower lobe. In general, the bronchi are large consistent with bronchiectasis. No pleural fluid or pleural lesion. Musculoskeletal: No thoracic fracture. Upper thoracic degenerative disc disease. Mid and lower thoracic ankylosis. CT ABDOMEN PELVIS FINDINGS Hepatobiliary: Liver parenchyma shows mild fatty change. The gallbladder is markedly distended and there is surrounding inflammatory change. There are at least diffuse small  stones dependent in the gallbladder. Findings are worrisome for acute cholecystitis by CT. Likely 4-5 mm common duct stone. There could be other noncalcified or less calcified ductal stones as well. Pancreas: Negative Spleen: Normal Adrenals/Urinary Tract: Adrenal glands are normal. Multiple bilateral benign appearing renal cysts. Stomach/Bowel: No primary bowel pathology. Diverticulosis without evidence of diverticulitis. Vascular/Lymphatic: Aortic atherosclerosis, advanced. No aneurysm. IVC is normal. No retroperitoneal adenopathy. Reproductive: Negative Other: No ascites or free air. Musculoskeletal: Extensive chronic degenerative changes of the lumbar spine including 8 mm anterolisthesis L4-5. IMPRESSION: Chest: Underlying emphysema. Underlying bronchiectasis. Scarring in the lower lungs, probably with some atelectasis in the right lower lobe. Extensive coronary artery calcification. Extensive aortic atherosclerosis. Abdomen: Probable acute cholecystitis. The gallbladder is markedly distended and there is surrounding inflammatory change. At least a few small stones dependent in the gallbladder. High suspicion of common duct stone or stones. Advanced aortic atherosclerosis. Diverticulosis without evidence of diverticulitis. Electronically Signed   By: MNelson ChimesM.D.   On: 10/05/2016 09:14    EKG: Independently reviewed.  Assessment/Plan Active Problems:   Hyperlipidemia   Hypertension   Type 2 diabetes, uncontrolled, with peripheral circulatory disorder (HCC)   [place]ipheral artery disease) [place]   Peripheral vascular disease in diabetes mellitus ([place]   CAD (coronary artery disease)   Vitamin D deficiency   Uncontrolled diabetes mellitus with diabetic nephropathy, with long-term current use of insulin (HCC)   Glaucoma   Cholecystitis, acute   Acute cholecystitis. Abd CT consistent with same.  Lipase less than 10  WBC  20.9 . Lactic acid 1  Surgery to to perform cholecystectomy likely today,  pending their plan, tentatively, G.I. is to proceed with ERCP on 12/17 pending on Anesthesia availability  -admit to [facility]  -Gentle hydration with IVF while NPO. I V Zosyn for per Pharmacy  CBC in am    Type II Diabetes Current blood sugar level is 143 Lab Results  Component Value Date   HGBA1C 7.9 (H) 10/04/2016   Hgb A1C Hold home oral diabetic medications.  Lantus , SSI Heart healthy carb modified diet.  Hypertension There were no vitals taken for  this visit. Controlled Continue home anti-hypertensive medications  Today,  Add Hydralazine Q6 hours as needed for BP 160/90   Hyperlipidemia Continue home statins  BPH  Continue Flomax    DVT prophylaxis:  SCD's   Code Status:   Full      Family Communication:  Discussed with patient and family  Disposition Plan: Expect patient to be discharged to home after condition improves Consults called:    [facility] (DR. Nannigan Admission status: SDU    Rondel Jumbo, PA-C [facility]   10/05/2016, 1:01 PM

## 2016-10-05 NOTE — Progress Notes (Signed)
Pharmacy Antibiotic Note  Henry Lindsey is a 82 y.o. male admitted on 10/05/2016 with intra-abdominal infection.  Pharmacy has been consulted for Zosyn dosing. She was transferred here from [facility] for ERCP following aggressively worsening right upper/right lower quadrant abdominal pain 2 days.  Plan: Zosyn 3.375g IV q8h (4 hour infusion).     Temp (24hrs), Avg:97.9 F (36.6 C), Min:97.7 F (36.5 C), Max:98 F (36.7 C)   Recent Labs Lab 10/04/16 0942 10/05/16 0630 10/05/16 0713  WBC  --  20.9*  --   CREATININE 1.76* 1.81*  --   LATICACIDVEN  --   --  1.0    Estimated Creatinine Clearance: 34.1 mL/min (by C-G formula based on SCr of 1.81 mg/dL (H)).    No Known Allergies  Thank you for allowing pharmacy to be a part of this patient's care.  Jodean Lima Lakeishia [place]ul[place] 10/05/2016 1:04 PM

## 2016-10-05 NOTE — Consult Note (Signed)
Reason for Consult: Cholecystitis Referring Physician: dr Charlesetta Ivory is an 82 y.o. male.  HPI: 82 year old male with peripheral arterial disease, diabetes mellitus, hypertension, prior CVA was transferred from [facility] hospital for acute calculus cholecystitis and possible choledocholithiasis for definitive medical management. 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 He states that he started having issues with that medication such as cramps as well as nausea vomiting abdominal pain. He stopped the oral chemotherapy agent around Thanksgiving but continued to have intermittent episodes of nausea, vomiting, abdominal pain. He was leaf blowing Thursday and later that evening had worsening abdominal discomfort. He points to his lower abdomen. He ended up seeing his primary care physician and ultimately went to the emergency room early this morning. He was found to have a white blood cell count of 20,000. Elevated LFTs. He was found to have a distended gallbladder with gallstones and a dilated bile duct.  GI medicine has seen him in consultation.  Past Medical History:  Diagnosis Date  . Carotid artery stenosis Feb 2012   <50% bilaterally  . CVA (cerebral infarction) feb 2012   r thalamic lacunar  . Diabetes mellitus   . Hyperlipidemia   . Hypertension   . Neuromuscular disorder ([place])   . Peripheral vascular disease in diabetes mellitus [facility]) Sept 2013    95% occlusion s/p PTCA R SFA Dew Sept 2013    Past Surgical History:  Procedure Laterality Date  . JOINT REPLACEMENT Left 2014   left knee  . UPPER GASTROINTESTINAL ENDOSCOPY      Family History  Problem Relation Age of Onset  . Diabetes Mother     Social History:  reports that he quit smoking about 48 years ago. He has never used smokeless tobacco. He reports that he does not drink alcohol or use drugs.  Allergies: No Known  Allergies  Medications: I have reviewed the patient's current medications.  Results for orders placed or performed during the hospital encounter of 10/05/16 (from the past 48 hour(s))  Surgical PCR screen     Status: None   Collection Time: 10/05/16 12:29 PM  Result Value Ref Range   MRSA, PCR NEGATIVE NEGATIVE   Staphylococcus aureus NEGATIVE NEGATIVE    Comment:        The Xpert SA Assay (FDA approved for NASAL specimens in patients over 12 years of age), is one component of a comprehensive surveillance program.  Test performance has been validated by [facility] for patients greater than or equal to 72 year old. It is not intended to diagnose infection nor to guide or monitor treatment.   Urinalysis, Routine w reflex microscopic     Status: Abnormal   Collection Time: 10/05/16  3:31 PM  Result Value Ref Range   Color, Urine YELLOW YELLOW   APPearance CLEAR CLEAR   Specific Gravity, Urine 1.012 1.005 - 1.030   pH 7.0 5.0 - 8.0   Glucose, UA 150 (A) NEGATIVE mg/dL   Hgb urine dipstick SMALL (A) NEGATIVE   Bilirubin Urine NEGATIVE NEGATIVE   Ketones, ur NEGATIVE NEGATIVE mg/dL   Protein, ur NEGATIVE NEGATIVE mg/dL   Nitrite NEGATIVE NEGATIVE   Leukocytes, UA NEGATIVE NEGATIVE   RBC / HPF 0-5 0 - 5 RBC/hpf   WBC, UA 0-5 0 - 5 WBC/hpf   Bacteria, UA NONE SEEN NONE SEEN   Squamous Epithelial / LPF NONE SEEN NONE SEEN  Dg Chest 2 View  Result Date: 10/04/2016 CLINICAL DATA:  Hypoxia, chest pain at night. EXAM: CHEST  2 VIEW COMPARISON:  08/07/2015 FINDINGS: Cardiomediastinal silhouette is normal. Mediastinal contours appear intact. Calcific atherosclerotic disease and tortuosity of the aorta are noted. There is no evidence of focal airspace consolidation, pleural effusion or pneumothorax. Osseous structures are without acute abnormality. Ankylosing changes of the thoracic spine are noted. Soft tissues are grossly normal. IMPRESSION: No active cardiopulmonary disease.  Calcific atherosclerotic disease of the aorta. Electronically Signed   By: Fidela Salisbury M.D.   On: 10/04/2016 14:49   Ct Chest W Contrast  Result Date: 10/05/2016 CLINICAL DATA:  Nausea, vomiting and right-sided abdominal pain over the last 3 weeks. Hypoxia . EXAM: CT CHEST, ABDOMEN, AND PELVIS WITH CONTRAST TECHNIQUE: Multidetector CT imaging of the chest, abdomen and pelvis was performed following the standard protocol during bolus administration of intravenous contrast. CONTRAST:  66mL ISOVUE-300 IOPAMIDOL (ISOVUE-300) INJECTION 61% COMPARISON:  04/10/2016 FINDINGS: CT CHEST FINDINGS Cardiovascular: Heart size is normal. No pericardial fluid. Extensive coronary artery calcification. Aortic atherosclerotic calcification. No pulmonary arterial lesion seen, though opacity is not sufficient to rule out emboli. Mediastinum/Nodes: No mass or lymphadenopathy. Lungs/Pleura: Mild emphysema and scarring in the upper lobes. Linear markings in the lower lobes probably represent a combination of scarring and atelectasis, particularly in the right lower lobe. In general, the bronchi are large consistent with bronchiectasis. No pleural fluid or pleural lesion. Musculoskeletal: No thoracic fracture. Upper thoracic degenerative disc disease. Mid and lower thoracic ankylosis. CT ABDOMEN PELVIS FINDINGS Hepatobiliary: Liver parenchyma shows mild fatty change. The gallbladder is markedly distended and there is surrounding inflammatory change. There are at least diffuse small stones dependent in the gallbladder. Findings are worrisome for acute cholecystitis by CT. Likely 4-5 mm common duct stone. There could be other noncalcified or less calcified ductal stones as well. Pancreas: Negative Spleen: Normal Adrenals/Urinary Tract: Adrenal glands are normal. Multiple bilateral benign appearing renal cysts. Stomach/Bowel: No primary bowel pathology. Diverticulosis without evidence of diverticulitis. Vascular/Lymphatic: Aortic  atherosclerosis, advanced. No aneurysm. IVC is normal. No retroperitoneal adenopathy. Reproductive: Negative Other: No ascites or free air. Musculoskeletal: Extensive chronic degenerative changes of the lumbar spine including 8 mm anterolisthesis L4-5. IMPRESSION: Chest: Underlying emphysema. Underlying bronchiectasis. Scarring in the lower lungs, probably with some atelectasis in the right lower lobe. Extensive coronary artery calcification. Extensive aortic atherosclerosis. Abdomen: Probable acute cholecystitis. The gallbladder is markedly distended and there is surrounding inflammatory change. At least a few small stones dependent in the gallbladder. High suspicion of common duct stone or stones. Advanced aortic atherosclerosis. Diverticulosis without evidence of diverticulitis. Electronically Signed   By: Nelson Chimes M.D.   On: 10/05/2016 09:14   Ct Abdomen Pelvis W Contrast  Result Date: 10/05/2016 CLINICAL DATA:  Nausea, vomiting and right-sided abdominal pain over the last 3 weeks. Hypoxia . EXAM: CT CHEST, ABDOMEN, AND PELVIS WITH CONTRAST TECHNIQUE: Multidetector CT imaging of the chest, abdomen and pelvis was performed following the standard protocol during bolus administration of intravenous contrast. CONTRAST:  73mL ISOVUE-300 IOPAMIDOL (ISOVUE-300) INJECTION 61% COMPARISON:  04/10/2016 FINDINGS: CT CHEST FINDINGS Cardiovascular: Heart size is normal. No pericardial fluid. Extensive coronary artery calcification. Aortic atherosclerotic calcification. No pulmonary arterial lesion seen, though opacity is not sufficient to rule out emboli. Mediastinum/Nodes: No mass or lymphadenopathy. Lungs/Pleura: Mild emphysema and scarring in the upper lobes. Linear markings in the lower lobes probably represent a combination of scarring and atelectasis, particularly in the right lower lobe.  In general, the bronchi are large consistent with bronchiectasis. No pleural fluid or pleural lesion. Musculoskeletal: No  thoracic fracture. Upper thoracic degenerative disc disease. Mid and lower thoracic ankylosis. CT ABDOMEN PELVIS FINDINGS Hepatobiliary: Liver parenchyma shows mild fatty change. The gallbladder is markedly distended and there is surrounding inflammatory change. There are at least diffuse small stones dependent in the gallbladder. Findings are worrisome for acute cholecystitis by CT. Likely 4-5 mm common duct stone. There could be other noncalcified or less calcified ductal stones as well. Pancreas: Negative Spleen: Normal Adrenals/Urinary Tract: Adrenal glands are normal. Multiple bilateral benign appearing renal cysts. Stomach/Bowel: No primary bowel pathology. Diverticulosis without evidence of diverticulitis. Vascular/Lymphatic: Aortic atherosclerosis, advanced. No aneurysm. IVC is normal. No retroperitoneal adenopathy. Reproductive: Negative Other: No ascites or free air. Musculoskeletal: Extensive chronic degenerative changes of the lumbar spine including 8 mm anterolisthesis L4-5. IMPRESSION: Chest: Underlying emphysema. Underlying bronchiectasis. Scarring in the lower lungs, probably with some atelectasis in the right lower lobe. Extensive coronary artery calcification. Extensive aortic atherosclerosis. Abdomen: Probable acute cholecystitis. The gallbladder is markedly distended and there is surrounding inflammatory change. At least a few small stones dependent in the gallbladder. High suspicion of common duct stone or stones. Advanced aortic atherosclerosis. Diverticulosis without evidence of diverticulitis. Electronically Signed   By: Nelson Chimes M.D.   On: 10/05/2016 09:14    Review of Systems  Constitutional: Negative for weight loss.  HENT: Negative for nosebleeds.   Eyes: Negative for blurred vision.  Respiratory: Negative for shortness of breath.   Cardiovascular: Negative for chest pain, palpitations, orthopnea and PND.       Denies DOE; has had peripheral arterial disease with claudication  symptoms requiring angioplasty in the past but currently denies claudication symptoms. Was scheduled for an outpatient nuclear stress test but did not go through with it  Gastrointestinal: Positive for abdominal pain, heartburn, nausea and vomiting.  Genitourinary: Negative for dysuria and hematuria.  Musculoskeletal: Negative.   Skin: Negative for itching and rash.  Neurological: Positive for tremors. Negative for dizziness, focal weakness, seizures, loss of consciousness and headaches.       Denies TIAs, amaurosis fugax; prior stroke in 2012 with some residual weakness  Endo/Heme/Allergies: Does not bruise/bleed easily.  Psychiatric/Behavioral: The patient is not nervous/anxious.    Blood pressure (!) 119/59, pulse (!) 109, temperature 99.3 F (37.4 C), temperature source Oral, resp. rate 20, height 5' 11.5" (1.816 m), weight 81.6 kg (180 lb), SpO2 92 %. Physical Exam  Vitals reviewed. Constitutional: He is oriented to person, place, and time. He appears well-developed and well-nourished. No distress.  HENT:  Head: Normocephalic and atraumatic.  Right Ear: External ear normal.  Left Ear: External ear normal.  Eyes: Conjunctivae are normal. No scleral icterus.  Neck: Normal range of motion. Neck supple. No tracheal deviation present. No thyromegaly present.  Cardiovascular: Normal rate and normal heart sounds.   Respiratory: Effort normal and breath sounds normal. No stridor. No respiratory distress. He has no wheezes.  GI: Soft. He exhibits no distension. There is tenderness in the right upper quadrant. There is no rigidity, no rebound and no guarding.    Musculoskeletal: He exhibits no edema or tenderness.  Lymphadenopathy:    He has no cervical adenopathy.  Neurological: He is alert and oriented to person, place, and time. He exhibits normal muscle tone.  Skin: Skin is warm and dry. No rash noted. He is not diaphoretic. No erythema. No pallor.  Psychiatric: He has a normal mood and  affect. His behavior is normal. Judgment and thought content normal.    Assessment/Plan: Acute calculus cholecystitis Elevated LFTs Possible choledocholithiasis Type 2 diabetes mellitus Hyperlipidemia Hypertension Peripheral arterial disease, history of angioplasty History of CVA  Since he stopped his oral chemotherapy agent around Thanksgiving and has had ongoing issues with abdominal discomfort, nausea and vomiting I'm concerned about the chronicity of his symptoms and that this is all been his gallbladder all along. Also his gallbladder is very distended on imaging. Given these findings and history I'm concerned that he would be at high risk for conversion to open cholecystectomy but more so higher risk for injuring surrounding structures such as his common bile duct.  Therefore I recommended proceeding with cholecystostomy tube placement.  He can then have outpatient follow-up with his cardiologist to evaluate his risk factors for cholecystectomy. His found to be an acceptable candidate for surgery then I would recommend interval cholecystectomy in 6-8 weeks once his gallbladder has cooled off  I had a long discussion with the patient and his wife regarding gallbladder disease. They were shown diagrams. I discussed the management of acute cholecystitis both operatively and nonoperatively. I discussed cholecystostomy tube placement and the typical aftercare.  They are in agreement with proceeding with cholecystostomy tube placement. I have spoken with interventional radiologist on call. This will tentatively be done Sunday morning  He can have liquids this evening and nothing by mouth after midnight.  Radiology request coag levels if not already drawn Repeat labs in am Cont IV abx   Leighton Ruff. Redmond Pulling, MD, FACS General, Bariatric, & Minimally Invasive Surgery [facility], [place]   [facility] M 10/05/2016, 4:48 PM

## 2016-10-05 NOTE — ED Notes (Signed)
Report to [facility], [place], await transport.

## 2016-10-05 NOTE — ED Provider Notes (Addendum)
-----------------------------------------   9:31 AM on 10/05/2016 -----------------------------------------  Patient resting currently abdomen is very tender in the right upper quadrant but not rigid. Voluntary guarding noted. Patient was worked up by prior physician. Because of low saturation initially, we did a CT of the chest as well as abdomen and pelvis. CT shows acute cholecystitis would likely common bile duct stone. Patient has received vancomycin and Zosyn here. He remains afebrile but he has a very high white count. I discussed with Dr. Azalee Course of surgery who feels patient requires ERCP, which is certainly reasonable. I discussed with Dr. Epimenio Foot, who is on-call for GI, we cannot do ERCP here on the weekend. I have paged [facility], and I'm awaiting a phone call back.  Arrival EKG, sinus tach rate 113, no acute ST elevation, nonspecific ST changes, normal axis. Possibly old anterior and inferior infarct.  ----------------------------------------- 9:41 AM on 10/05/2016 -----------------------------------------  dw dr/ nandigan of [place], they agree w/ transfer but want hospitalist admission  So transfer center is arranging for hospitalist to call me back.  Awaiting call.     Schuyler Amor, MD 10/05/16 [id number]    Schuyler Amor, MD 10/05/16 [id number]    Schuyler Amor, MD 10/06/16 [phone]

## 2016-10-05 NOTE — ED Triage Notes (Signed)
Pt to triage via [place], reports RLQ pain x 2 days, reports n/v x 8 weeks but due to medication he takes for basal cell carcinoma.  Pt NAD at this time.

## 2016-10-05 NOTE — Consult Note (Signed)
Referring Provider:  EDP at [facility], Dr. Burlene Arnt Primary Care Physician:  [name], MD Primary Gastroenterologist:  Althia Forts   Reason for Consultation:  CBD stones  HPI: Henry Lindsey is a 82 y.o. male with medical history significant for HTN, HLD, PVD, previous CVA, DM, basal cell carcinoma on his nose.  Patient was transferred from [facility] to [facility] when he was found to have possible CBD stones on CT scan.  No ERCP available at that facility this weekend.  Patient presented with complaints of intermittent vomiting for the past few weeks that he thought was related to a medication that he was taking for his basal cell carcinoma.  Then 2 days ago he started having upper abdominal pain.  He thought it may be a muscle because he was leaf-blowing earlier that day.  Then saw his PCP, Dr. Derrel Nip, on 12/15 and she was going to treat him for acid related discomfort.  Pain continued so he went to the ED.  AST 13, ALT 131, ALP normal, total bili 1.7.  WBC count elevated at 20.9.  CT scan as follows:  IMPRESSION: Chest: Underlying emphysema. Underlying bronchiectasis. Scarring in the lower lungs, probably with some atelectasis in the right lower lobe. Extensive coronary artery calcification. Extensive aortic atherosclerosis.  Abdomen: Probable acute cholecystitis. The gallbladder is markedly distended and there is surrounding inflammatory change. At least a few small stones dependent in the gallbladder. High suspicion of common duct stone or stones.  Advanced aortic atherosclerosis. Diverticulosis without evidence of diverticulitis.  Past Medical History:  Diagnosis Date  . Carotid artery stenosis Feb 2012   <50% bilaterally  . CVA (cerebral infarction) feb 2012   r thalamic lacunar  . Diabetes mellitus   . Hyperlipidemia   . Hypertension   . Neuromuscular disorder ([place])   . Peripheral vascular disease in diabetes mellitus [facility]) Sept 2013    95% occlusion s/p PTCA R SFA  Dew Sept 2013    Past Surgical History:  Procedure Laterality Date  . JOINT REPLACEMENT Left 2014   left knee  . UPPER GASTROINTESTINAL ENDOSCOPY      Prior to Admission medications   Medication Sig Start Date End Date Taking? Authorizing Provider  Alum & Mag Hydroxide-Simeth (MAGIC MOUTHWASH) SOLN Take 5 mLs by mouth 3 (three) times daily as needed for mouth pain.    Historical Provider, MD  amLODipine (NORVASC) 10 MG tablet TAKE 1 TABLET BY MOUTH EVERY DAY 09/26/16   [name], MD  aspirin EC 81 MG tablet Take 1 tablet (81 mg total) by mouth daily. 11/03/13   [name], MD  atorvastatin (LIPITOR) 40 MG tablet TAKE 1 TABLET BY MOUTH EVERY DAY 07/15/16   [name], MD  Blood Glucose Monitoring Suppl (ONE TOUCH ULTRA SYSTEM KIT) w/Device KIT 1 kit by Does not apply route once. Use DX code E11.59 10/18/15   [name], MD  cilostazol (PLETAL) 100 MG tablet TAKE 1 TABLET(100 MG) BY MOUTH TWICE DAILY 08/13/16   [name], MD  cyclobenzaprine (FLEXERIL) 5 MG tablet Take 1 tablet (5 mg total) by mouth 3 (three) times daily as needed for muscle spasms. 09/10/16   [name], MD  ERIVEDGE 150 MG capsule Take 1 tablet by mouth daily. 08/23/16   Historical Provider, MD  famotidine (PEPCID) 20 MG tablet TAKE 1 TABLET(20 MG) BY MOUTH TWICE DAILY 05/08/16   [name], MD  fenofibrate micronized (LOFIBRA) 134 MG capsule TAKE  1 CAPSULE BY MOUTH EVERY MORNING BEFORE BREAKFAST 08/28/16   [name], MD  finasteride (PROSCAR) 5 MG tablet Take 1 tablet (5 mg total) by mouth daily. 05/04/15   [name], MD  gabapentin (NEURONTIN) 400 MG capsule TAKE 1 CAPSULE(400 MG) BY MOUTH THREE TIMES DAILY 09/09/16   [name], MD  glipiZIDE (GLUCOTROL) 10 MG tablet TAKE 1 TABLET(10 MG) BY MOUTH TWICE DAILY 03/27/16   [name], MD  glucose blood test strip Use as instructed three times daily 05/03/16   [name], MD  HYDROcodone-acetaminophen (NORCO/VICODIN) 5-325 MG tablet  One tablet two times daily as needed for knee pain 09/03/16   [name], MD  INS SYRINGE/NEEDLE 1CC/28G (B-D INSULIN SYRINGE 1CC/28G) 28G X 1/2" 1 ML MISC USE TO ADMINISTER INSULIN DAILY 02/13/16   [name], MD  insulin lispro protamine-lispro (HUMALOG MIX 75/25) (75-25) 100 UNIT/ML SUSP injection Inject 10 units right before  breakfast and 6 units before dinner 05/03/16   [name], MD  INSULIN SYRINGE .5CC/29G 29G X 1/2" 0.5 ML MISC 1 Syringe by Does not apply route 3 (three) times daily. 08/05/11   [name], MD  Insulin Syringe-Needle U-100 (INSULIN SYRINGE .5CC/31GX5/16") 31G X 5/16" 0.5 ML MISC USE THREE TIMES DAILY 03/18/13   [name], MD  JANUVIA 100 MG tablet TAKE 1 TABLET(100 MG) BY MOUTH DAILY. NOTE DOSE INCREASE 08/13/16   [name], MD  latanoprost (XALATAN) 0.005 % ophthalmic solution INSTILL 1 DROP INTO BOTH EYES QD 11/01/15   Historical Provider, MD  losartan-hydrochlorothiazide (HYZAAR) 100-25 MG tablet TAKE 1 TABLET BY MOUTH EVERY DAY 09/26/16   [name], MD  meloxicam (MOBIC) 15 MG tablet TAKE 1 TABLET(15 MG) BY MOUTH DAILY 03/15/16   [name], MD  metFORMIN (GLUCOPHAGE) 1000 MG tablet TAKE 1 TABLET BY MOUTH TWICE DAILY 07/26/16   [name], MD  metoprolol succinate (TOPROL-XL) 100 MG 24 hr tablet TAKE 1 TABLET BY MOUTH ONCE DAILY 09/03/13   [name], MD  nitroGLYCERIN (NITROSTAT) 0.4 MG SL tablet Place 1 tablet (0.4 mg total) under the tongue every 5 (five) minutes as needed for chest pain. MAXIMUM 3 TABLETS 10/04/16   [name], MD  ondansetron (ZOFRAN) 4 MG tablet Take 1 tablet (4 mg total) by mouth every 8 (eight) hours as needed for nausea or vomiting. 09/03/16   [name], MD  [facility] DELICA LANCETS 66A MISC Use three times daily to check blood sugar. 10/18/15   [name], MD  pantoprazole (PROTONIX) 40 MG tablet Take 1 tablet (40 mg total) by mouth 2 (two) times daily before a meal. 10/04/16   [name], MD    tamsulosin (FLOMAX) 0.4 MG CAPS capsule Take 0.4 mg by mouth daily after breakfast.    Historical Provider, MD  traMADol (ULTRAM) 50 MG tablet Take 50 mg by mouth every 6 (six) hours as needed.  11/24/13   Historical Provider, MD    No current facility-administered medications for this encounter.     Allergies as of 10/05/2016  . (No Known Allergies)    Family History  Problem Relation Age of Onset  . Diabetes Mother     Social History   Social History  . Marital status: Married    Spouse name: Enid Derry, deceased  . Number of children: 2  . Years of education: 12   Occupational History  .  Retired  Social History Main Topics  . Smoking status: Former Smoker    Quit date: 04/21/1968  . Smokeless tobacco: Never Used     Comment: quit 1969  . Alcohol use No  . Drug use: No  . Sexual activity: Yes    Partners: Female   Other Topics Concern  . Not on file   Social History Narrative   Widowed in 2014; dating again     Review of Systems: ROS is O/W negative except as mentioned in HPI.  Physical Exam: Vital signs in last 24 hours: Temp:  [97.7 F (36.5 C)-98 F (36.7 C)] 97.7 F (36.5 C) (12/16 1043) Pulse Rate:  [87-121] 88 (12/16 1043) Resp:  [18-24] 18 (12/16 1043) BP: (123-161)/(55-145) 127/55 (12/16 1043) SpO2:  [89 %-96 %] 96 % (12/16 1043) Weight:  [190 lb (86.2 kg)] 190 lb (86.2 kg) (12/16 4193)   General:  Alert, Well-developed, well-nourished, pleasant and cooperative in NAD Head:  Normocephalic and atraumatic. Eyes:  Sclera clear, no icterus.  Conjunctiva pink. Ears:  Normal auditory acuity. Mouth:  No deformity or lesions.   Lungs:  Clear throughout to auscultation.  No wheezes, crackles, or rhonchi.  No increased WOB. Heart:  Regular rate and rhythm; no murmurs, clicks, rubs, or gallops. Abdomen:  Soft, non-distended.  BS present.  Significant RUQ TTP.  Rectal:  Deferred  Msk:  Symmetrical without gross deformities. Pulses:  Normal pulses  noted. Extremities:  Without clubbing or edema. Neurologic:  Alert and oriented x 4;  grossly normal neurologically. Skin:  Intact without significant lesions or rashes. Psych:  Alert and cooperative. Normal mood and affect.  Lab Results:  Recent Labs  10/05/16 0630  WBC 20.9*  HGB 13.0  HCT 39.2*  PLT 293   BMET  Recent Labs  10/04/16 0942 10/05/16 0630  NA 133* 129*  K 4.9 4.5  CL 93* 94*  CO2 32 26  GLUCOSE 190* 177*  BUN 30* 30*  CREATININE 1.76* 1.81*  CALCIUM 9.5 8.9   LFT  Recent Labs  10/05/16 0630  PROT 7.2  ALBUMIN 4.0  AST 130*  ALT 131*  ALKPHOS 59  BILITOT 1.7*   Studies/Results: Dg Chest 2 View  Result Date: 10/04/2016 CLINICAL DATA:  Hypoxia, chest pain at night. EXAM: CHEST  2 VIEW COMPARISON:  08/07/2015 FINDINGS: Cardiomediastinal silhouette is normal. Mediastinal contours appear intact. Calcific atherosclerotic disease and tortuosity of the aorta are noted. There is no evidence of focal airspace consolidation, pleural effusion or pneumothorax. Osseous structures are without acute abnormality. Ankylosing changes of the thoracic spine are noted. Soft tissues are grossly normal. IMPRESSION: No active cardiopulmonary disease. Calcific atherosclerotic disease of the aorta. Electronically Signed   By: Fidela Salisbury M.D.   On: 10/04/2016 14:49   Ct Chest W Contrast  Result Date: 10/05/2016 CLINICAL DATA:  Nausea, vomiting and right-sided abdominal pain over the last 3 weeks. Hypoxia . EXAM: CT CHEST, ABDOMEN, AND PELVIS WITH CONTRAST TECHNIQUE: Multidetector CT imaging of the chest, abdomen and pelvis was performed following the standard protocol during bolus administration of intravenous contrast. CONTRAST:  51m ISOVUE-300 IOPAMIDOL (ISOVUE-300) INJECTION 61% COMPARISON:  04/10/2016 FINDINGS: CT CHEST FINDINGS Cardiovascular: Heart size is normal. No pericardial fluid. Extensive coronary artery calcification. Aortic atherosclerotic  calcification. No pulmonary arterial lesion seen, though opacity is not sufficient to rule out emboli. Mediastinum/Nodes: No mass or lymphadenopathy. Lungs/Pleura: Mild emphysema and scarring in the upper lobes. Linear markings in the lower lobes probably represent a combination of scarring and  atelectasis, particularly in the right lower lobe. In general, the bronchi are large consistent with bronchiectasis. No pleural fluid or pleural lesion. Musculoskeletal: No thoracic fracture. Upper thoracic degenerative disc disease. Mid and lower thoracic ankylosis. CT ABDOMEN PELVIS FINDINGS Hepatobiliary: Liver parenchyma shows mild fatty change. The gallbladder is markedly distended and there is surrounding inflammatory change. There are at least diffuse small stones dependent in the gallbladder. Findings are worrisome for acute cholecystitis by CT. Likely 4-5 mm common duct stone. There could be other noncalcified or less calcified ductal stones as well. Pancreas: Negative Spleen: Normal Adrenals/Urinary Tract: Adrenal glands are normal. Multiple bilateral benign appearing renal cysts. Stomach/Bowel: No primary bowel pathology. Diverticulosis without evidence of diverticulitis. Vascular/Lymphatic: Aortic atherosclerosis, advanced. No aneurysm. IVC is normal. No retroperitoneal adenopathy. Reproductive: Negative Other: No ascites or free air. Musculoskeletal: Extensive chronic degenerative changes of the lumbar spine including 8 mm anterolisthesis L4-5. IMPRESSION: Chest: Underlying emphysema. Underlying bronchiectasis. Scarring in the lower lungs, probably with some atelectasis in the right lower lobe. Extensive coronary artery calcification. Extensive aortic atherosclerosis. Abdomen: Probable acute cholecystitis. The gallbladder is markedly distended and there is surrounding inflammatory change. At least a few small stones dependent in the gallbladder. High suspicion of common duct stone or stones. Advanced aortic  atherosclerosis. Diverticulosis without evidence of diverticulitis. Electronically Signed   By: Nelson Chimes M.D.   On: 10/05/2016 09:14   Ct Abdomen Pelvis W Contrast  Result Date: 10/05/2016 CLINICAL DATA:  Nausea, vomiting and right-sided abdominal pain over the last 3 weeks. Hypoxia . EXAM: CT CHEST, ABDOMEN, AND PELVIS WITH CONTRAST TECHNIQUE: Multidetector CT imaging of the chest, abdomen and pelvis was performed following the standard protocol during bolus administration of intravenous contrast. CONTRAST:  33m ISOVUE-300 IOPAMIDOL (ISOVUE-300) INJECTION 61% COMPARISON:  04/10/2016 FINDINGS: CT CHEST FINDINGS Cardiovascular: Heart size is normal. No pericardial fluid. Extensive coronary artery calcification. Aortic atherosclerotic calcification. No pulmonary arterial lesion seen, though opacity is not sufficient to rule out emboli. Mediastinum/Nodes: No mass or lymphadenopathy. Lungs/Pleura: Mild emphysema and scarring in the upper lobes. Linear markings in the lower lobes probably represent a combination of scarring and atelectasis, particularly in the right lower lobe. In general, the bronchi are large consistent with bronchiectasis. No pleural fluid or pleural lesion. Musculoskeletal: No thoracic fracture. Upper thoracic degenerative disc disease. Mid and lower thoracic ankylosis. CT ABDOMEN PELVIS FINDINGS Hepatobiliary: Liver parenchyma shows mild fatty change. The gallbladder is markedly distended and there is surrounding inflammatory change. There are at least diffuse small stones dependent in the gallbladder. Findings are worrisome for acute cholecystitis by CT. Likely 4-5 mm common duct stone. There could be other noncalcified or less calcified ductal stones as well. Pancreas: Negative Spleen: Normal Adrenals/Urinary Tract: Adrenal glands are normal. Multiple bilateral benign appearing renal cysts. Stomach/Bowel: No primary bowel pathology. Diverticulosis without evidence of diverticulitis.  Vascular/Lymphatic: Aortic atherosclerosis, advanced. No aneurysm. IVC is normal. No retroperitoneal adenopathy. Reproductive: Negative Other: No ascites or free air. Musculoskeletal: Extensive chronic degenerative changes of the lumbar spine including 8 mm anterolisthesis L4-5. IMPRESSION: Chest: Underlying emphysema. Underlying bronchiectasis. Scarring in the lower lungs, probably with some atelectasis in the right lower lobe. Extensive coronary artery calcification. Extensive aortic atherosclerosis. Abdomen: Probable acute cholecystitis. The gallbladder is markedly distended and there is surrounding inflammatory change. At least a few small stones dependent in the gallbladder. High suspicion of common duct stone or stones. Advanced aortic atherosclerosis. Diverticulosis without evidence of diverticulitis. Electronically Signed   By: MJan FiremanD.  On: 10/05/2016 09:14    IMPRESSION:  -82 year old male with acute cholecystitis and suspected CBD stones by CT imaging.  Leukocytosis, elevated LFT's related to above.  PLAN: -Cholecystitis is priority.  Hopefully surgery can see him today and possibly take his gallbladder out vs cholecystostomy tube.  Pending their plan we will tentatively plan for ERCP on 12/17 pending anesthesia availability. -Will consult pharmacy for Zosyn for now.  Taequan Stockhausen D.  10/05/2016, 12:20 PM  Pager number [phone]

## 2016-10-05 NOTE — ED Provider Notes (Signed)
[facility] Emergency Department Provider Note   First MD Initiated Contact with Patient 10/05/16 0700     (approximate)  I have reviewed the triage vital signs and the nursing notes.   HISTORY  Chief Complaint Abdominal Pain    HPI Henry Lindsey is a 82 y.o. male with a bolus of chronic medical conditions presents to the emergency department with aggressively worsening right upper/right lower quadrant abdominal pain 2 days. In addition patient admits to daily nonbloody emesis times approximately 8 weeks. Patient states that he thought his vomiting was secondary to medication and he took for a basal cell carcinoma which is located on his nose however he states that his last dose of the stated medication was 3 weeks ago. Patient states that his current abdominal pain score is 10 out of 10. Patient also admits to intermittent fevers at home. Although patient is afebrile on presentation temperature 98. Patient also noted to be tachycardic on presentation with a heart rate of 121 tachypnea lacerated 22 hypoxic with a O2 saturation 89%. Patient also admits to a nonproductive cough for which he saw his primary care provider Dr.Tullo 2 days ago with a negative chest x-ray performed.   Past Medical History:  Diagnosis Date  . Carotid artery stenosis Feb 2012   <50% bilaterally  . CVA (cerebral infarction) feb 2012   r thalamic lacunar  . Diabetes mellitus   . Hyperlipidemia   . Hypertension   . Neuromuscular disorder ([place])   . Peripheral vascular disease in diabetes mellitus [facility]) Sept 2013    95% occlusion s/p PTCA R SFA Dew Sept 2013    Patient Active Problem List   Diagnosis Date Noted  . Myalgia 09/05/2016  . Narcotic drug use 07/02/2016  . Colitis, acute 04/02/2016  . LLQ pain 04/02/2016  . Chronic bronchitis with productive mucopurulent cough ([place]) 08/08/2015  . Pain in joint, ankle and foot 02/25/2015  . Diabetic neuropathy ([place]) 10/09/2014  . BPH  with obstruction/lower urinary tract symptoms 10/09/2014  . Noncompliance with diet and medication regimen 07/09/2014  . Bursitis of hip, right 04/05/2014  . Glaucoma 04/05/2014  . Uncontrolled diabetes mellitus with diabetic nephropathy, with long-term current use of insulin ([place]) 12/28/2013  . Peripheral autonomic neuropathy due to DM ([place]) 12/28/2013  . Overweight (BMI 25.0-29.9) 11/30/2013  . Vitamin D deficiency 11/03/2013  . Impotence due to erectile dysfunction 06/01/2013  . Routine general medical examination at a health care facility 04/04/2013  . CAD (coronary artery disease) 07/21/2012  . Peripheral vascular disease in diabetes mellitus ([place])   . Carotid artery stenosis with cerebral infarction ([place])   . PAD (peripheral artery disease) ([place]) 07/01/2012  . History of basal cell carcinoma excision 08/05/2011  . 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 Type 2 diabetes, uncontrolled, with peripheral circulatory disorder ([place]) 08/05/2011  . Hyperlipidemia   . Hypertension   . CVA (cerebral infarction)     Past Surgical History:  Procedure Laterality Date  . JOINT REPLACEMENT Left 2014   left knee  . UPPER GASTROINTESTINAL ENDOSCOPY      Prior to Admission medications   Medication Sig Start Date End Date Taking? Authorizing Provider  Alum & Mag Hydroxide-Simeth (MAGIC MOUTHWASH) SOLN Take 5 mLs by mouth 3 (three) times daily as needed for mouth pain.    Historical Provider, MD  amLODipine (NORVASC) 10 MG tablet TAKE 1 TABLET BY MOUTH EVERY DAY 09/26/16   [name], MD  aspirin EC 81 MG  tablet Take 1 tablet (81 mg total) by mouth daily. 11/03/13   [name], MD  atorvastatin (LIPITOR) 40 MG tablet TAKE 1 TABLET BY MOUTH EVERY DAY 07/15/16   [name], MD  Blood Glucose Monitoring Suppl (ONE TOUCH ULTRA SYSTEM KIT) w/Device KIT 1 kit by Does not apply route once. Use DX code E11.59 10/18/15   [name], MD  cilostazol (PLETAL) 100 MG tablet TAKE 1 TABLET(100 MG) BY MOUTH  TWICE DAILY 08/13/16   [name], MD  cyclobenzaprine (FLEXERIL) 5 MG tablet Take 1 tablet (5 mg total) by mouth 3 (three) times daily as needed for muscle spasms. 09/10/16   [name], MD  ERIVEDGE 150 MG capsule Take 1 tablet by mouth daily. 08/23/16   Historical Provider, MD  famotidine (PEPCID) 20 MG tablet TAKE 1 TABLET(20 MG) BY MOUTH TWICE DAILY 05/08/16   [name], MD  fenofibrate micronized (LOFIBRA) 134 MG capsule TAKE 1 CAPSULE BY MOUTH EVERY MORNING BEFORE BREAKFAST 08/28/16   [name], MD  finasteride (PROSCAR) 5 MG tablet Take 1 tablet (5 mg total) by mouth daily. 05/04/15   [name], MD  gabapentin (NEURONTIN) 400 MG capsule TAKE 1 CAPSULE(400 MG) BY MOUTH THREE TIMES DAILY 09/09/16   [name], MD  glipiZIDE (GLUCOTROL) 10 MG tablet TAKE 1 TABLET(10 MG) BY MOUTH TWICE DAILY 03/27/16   [name], MD  glipiZIDE (GLUCOTROL) 10 MG tablet TAKE 1 TABLET(10 MG) BY MOUTH TWICE DAILY 09/09/16   [name], MD  glucose blood test strip Use as instructed three times daily 05/03/16   [name], MD  HYDROcodone-acetaminophen (NORCO/VICODIN) 5-325 MG tablet One tablet two times daily as needed for knee pain 09/03/16   [name], MD  INS SYRINGE/NEEDLE 1CC/28G (B-D INSULIN SYRINGE 1CC/28G) 28G X 1/2" 1 ML MISC USE TO ADMINISTER INSULIN DAILY 02/13/16   [name], MD  insulin lispro protamine-lispro (HUMALOG MIX 75/25) (75-25) 100 UNIT/ML SUSP injection Inject 10 units right before  breakfast and 6 units before dinner 05/03/16   [name], MD  INSULIN SYRINGE .5CC/29G 29G X 1/2" 0.5 ML MISC 1 Syringe by Does not apply route 3 (three) times daily. 08/05/11   [name], MD  Insulin Syringe-Needle U-100 (INSULIN SYRINGE .5CC/31GX5/16") 31G X 5/16" 0.5 ML MISC USE THREE TIMES DAILY 03/18/13   [name], MD  JANUVIA 100 MG tablet TAKE 1 TABLET(100 MG) BY MOUTH DAILY. NOTE DOSE INCREASE 08/13/16   [name], MD  latanoprost (XALATAN) 0.005  % ophthalmic solution INSTILL 1 DROP INTO BOTH EYES QD 11/01/15   Historical Provider, MD  losartan-hydrochlorothiazide (HYZAAR) 100-25 MG tablet TAKE 1 TABLET BY MOUTH EVERY DAY 09/26/16   [name], MD  meloxicam (MOBIC) 15 MG tablet TAKE 1 TABLET(15 MG) BY MOUTH DAILY 03/15/16   [name], MD  metFORMIN (GLUCOPHAGE) 1000 MG tablet TAKE 1 TABLET BY MOUTH TWICE DAILY 07/26/16   [name], MD  metoprolol succinate (TOPROL-XL) 100 MG 24 hr tablet TAKE 1 TABLET BY MOUTH ONCE DAILY 09/03/13   [name], MD  nitroGLYCERIN (NITROSTAT) 0.4 MG SL tablet Place 1 tablet (0.4 mg total) under the tongue every 5 (five) minutes as needed for chest pain. MAXIMUM 3 TABLETS 10/04/16   [name], MD  ondansetron (ZOFRAN) 4 MG tablet Take 1 tablet (4 mg total) by mouth every 8 (eight) hours as needed for nausea  or vomiting. 09/03/16   [name], MD  [facility] DELICA LANCETS 35K MISC Use three times daily to check blood sugar. 10/18/15   [name], MD  pantoprazole (PROTONIX) 40 MG tablet Take 1 tablet (40 mg total) by mouth 2 (two) times daily before a meal. 10/04/16   [name], MD  tamsulosin (FLOMAX) 0.4 MG CAPS capsule Take 0.4 mg by mouth daily after breakfast.    Historical Provider, MD  traMADol Veatrice Bourbon) 50 MG tablet  11/24/13   Historical Provider, MD    Allergies Patient has no known allergies.  Family History  Problem Relation Age of Onset  . Diabetes Mother     Social History Social History  Substance Use Topics  . Smoking status: Former Smoker    Quit date: 04/21/1968  . Smokeless tobacco: Never Used     Comment: quit 1969  . Alcohol use No    Review of Systems Constitutional: No fever/chills Eyes: No visual changes. ENT: No sore throat. Cardiovascular: Denies chest pain. Respiratory: Denies shortness of breath.Positive for cough Gastrointestinal: Positive for abdominal pain and vomiting Genitourinary: Negative for dysuria. Musculoskeletal: Negative for  back pain. Skin: Negative for rash. Neurological: Negative for headaches, focal weakness or numbness.  10-point ROS otherwise negative.  ____________________________________________   PHYSICAL EXAM:  VITAL SIGNS: ED Triage Vitals  Enc Vitals Group     BP 10/05/16 0619 (!) 161/145     Pulse Rate 10/05/16 0619 (!) 121     Resp 10/05/16 0619 18     Temp 10/05/16 0619 98 F (36.7 C)     Temp Source 10/05/16 0619 Oral     SpO2 10/05/16 0619 93 %     Weight 10/05/16 0614 190 lb (86.2 kg)     Height 10/05/16 0614 _0  (1.803 m)     Head Circumference --      Peak Flow --      Pain Score 10/05/16 0615 10     Pain Loc --      Pain Edu? --      Excl. in [place]? --     Constitutional: Alert and oriented. Well appearing and in no acute distress. Eyes: Conjunctivae are normal. PERRL. EOMI. Head: Atraumatic. Ears:  Healthy appearing ear canals and TMs bilaterally Nose: No congestion/rhinnorhea. Mouth/Throat: Mucous membranes are moist.  Oropharynx non-erythematous. Neck: No stridor.  No meningeal signs.  Cardiovascular: Normal rate, regular rhythm. Good peripheral circulation. Grossly normal heart sounds. Respiratory: Normal respiratory effort.  No retractions. Bibasilar rhonchi  Gastrointestinal: Right upper quadrant/right lower quadrant tenderness to palpation. positive for distention.  Musculoskeletal: No lower extremity tenderness nor edema. No gross deformities of extremities. Neurologic:  Normal speech and language. No gross focal neurologic deficits are appreciated.  Skin:  Skin is warm, dry and intact. No rash noted. Psychiatric: Mood and affect are normal. Speech and behavior are normal.  ____________________________________________   LABS (all labs ordered are listed, but only abnormal results are displayed)  Labs Reviewed  CBC - Abnormal; Notable for the following:       Result Value   WBC 20.9 (*)    HCT 39.2 (*)    All other components within normal limits    CULTURE, BLOOD (ROUTINE X 2)  CULTURE, BLOOD (ROUTINE X 2)  URINE CULTURE  LIPASE, BLOOD  COMPREHENSIVE METABOLIC PANEL  URINALYSIS, COMPLETE (UACMP) WITH MICROSCOPIC  INFLUENZA PANEL BY PCR (TYPE A & B, H1N1)  LACTIC ACID, PLASMA  LACTIC ACID, PLASMA  RADIOLOGY I, [place], personally viewed and evaluated these images (plain radiographs) as part of my medical decision making, as well as reviewing the written report by the radiologist.  Dg Chest 2 View  Result Date: 10/04/2016 CLINICAL DATA:  Hypoxia, chest pain at night. EXAM: CHEST  2 VIEW COMPARISON:  08/07/2015 FINDINGS: Cardiomediastinal silhouette is normal. Mediastinal contours appear intact. Calcific atherosclerotic disease and tortuosity of the aorta are noted. There is no evidence of focal airspace consolidation, pleural effusion or pneumothorax. Osseous structures are without acute abnormality. Ankylosing changes of the thoracic spine are noted. Soft tissues are grossly normal. IMPRESSION: No active cardiopulmonary disease. Calcific atherosclerotic disease of the aorta. Electronically Signed   By: Fidela Salisbury M.D.   On: 10/04/2016 14:49      Procedures   Critical Care performed:CRITICAL CARE Performed by: Gregor Hams   Total critical care time: 30 minutes  Critical care time was exclusive of separately billable procedures and treating other patients.  Critical care was necessary to treat or prevent imminent or life-threatening deterioration.  Critical care was time spent personally by me on the following activities: development of treatment plan with patient and/or surrogate as well as nursing, discussions with consultants, evaluation of patient's response to treatment, examination of patient, obtaining history from patient or surrogate, ordering and performing treatments and interventions, ordering and review of laboratory studies, ordering and review of radiographic studies, pulse oximetry and  re-evaluation of patient's condition.  ____________________________________________   INITIAL IMPRESSION / ASSESSMENT AND PLAN / ED COURSE  Pertinent labs & imaging results that were available during my care of the patient were reviewed by me and considered in my medical decision making (see chart for details).  History physical exam vital signs consistent with possible sepsis given the patient's tachycardic tachypnea, hypoxic. Laboratory data revealed a white blood cell count of 20.9. As such patient given broad-spectrum antibiotics vancomycin and Zosyn as well as 30 ML's per kilogram of normal saline. CT scan of the chest abdomen pelvis pending. Patient's care transferred to Dr. Burlene Arnt   Clinical Course     ____________________________________________  FINAL CLINICAL IMPRESSION(S) / ED DIAGNOSES  Final diagnoses:  Right upper quadrant abdominal pain  Sepsis, due to unspecified organism ([place])     MEDICATIONS GIVEN DURING THIS VISIT:  Medications  ondansetron (ZOFRAN-ODT) disintegrating tablet 4 mg (not administered)  morphine 4 MG/ML injection 2 mg (not administered)  sodium chloride 0.9 % bolus 1,000 mL (not administered)    And  sodium chloride 0.9 % bolus 1,000 mL (not administered)    And  sodium chloride 0.9 % bolus 1,000 mL (not administered)  piperacillin-tazobactam (ZOSYN) IVPB 3.375 g (not administered)  vancomycin (VANCOCIN) IVPB 1000 mg/200 mL premix (not administered)  iopamidol (ISOVUE-300) 61 % injection 30 mL (not administered)     NEW OUTPATIENT MEDICATIONS STARTED DURING THIS VISIT:  New Prescriptions   No medications on file    Modified Medications   No medications on file    Discontinued Medications   No medications on file     Note:  This document was prepared using Dragon voice recognition software and may include unintentional dictation errors.    Gregor Hams, MD 10/05/16 [phone]

## 2016-10-05 NOTE — ED Notes (Signed)
Pt placed on oxygen at 2lpm via  for pox of 88% on ra.

## 2016-10-05 NOTE — ED Notes (Signed)
Report to kim, rn.  

## 2016-10-06 ENCOUNTER — Encounter (HOSPITAL_COMMUNITY)
Admission: AD | Disposition: A | Discharge status: Home / Self Care | Payer: Self-pay | Source: Ambulatory Visit | Attending: Internal Medicine

## 2016-10-06 ENCOUNTER — Encounter (HOSPITAL_COMMUNITY): Payer: Self-pay

## 2016-10-06 ENCOUNTER — Inpatient Hospital Stay (HOSPITAL_COMMUNITY): Payer: Medicare Other

## 2016-10-06 DIAGNOSIS — I739 Peripheral vascular disease, unspecified: Secondary | ICD-10-CM

## 2016-10-06 HISTORY — PX: IR GENERIC HISTORICAL: IMG1180011

## 2016-10-06 LAB — URINE CULTURE: Culture: NO GROWTH

## 2016-10-06 LAB — CBC
HEMATOCRIT: 37.2 % — AB (ref 39.0–52.0)
HEMOGLOBIN: 11.9 g/dL — AB (ref 13.0–17.0)
MCH: 27.8 pg (ref 26.0–34.0)
MCHC: 32 g/dL (ref 30.0–36.0)
MCV: 86.9 fL (ref 78.0–100.0)
Platelets: 226 10*3/uL (ref 150–400)
RBC: 4.28 MIL/uL (ref 4.22–5.81)
RDW: 13.9 % (ref 11.5–15.5)
WBC: 13.3 10*3/uL — ABNORMAL HIGH (ref 4.0–10.5)

## 2016-10-06 LAB — PROTIME-INR
INR: 1.43
Prothrombin Time: 17.5 seconds — ABNORMAL HIGH (ref 11.4–15.2)

## 2016-10-06 LAB — COMPREHENSIVE METABOLIC PANEL
ALBUMIN: 2.8 g/dL — AB (ref 3.5–5.0)
ALT: 181 U/L — ABNORMAL HIGH (ref 17–63)
AST: 136 U/L — AB (ref 15–41)
Alkaline Phosphatase: 75 U/L (ref 38–126)
Anion gap: 11 (ref 5–15)
BUN: 15 mg/dL (ref 6–20)
CHLORIDE: 103 mmol/L (ref 101–111)
CO2: 22 mmol/L (ref 22–32)
Calcium: 8.5 mg/dL — ABNORMAL LOW (ref 8.9–10.3)
Creatinine, Ser: 1.59 mg/dL — ABNORMAL HIGH (ref 0.61–1.24)
GFR calc Af Amer: 45 mL/min — ABNORMAL LOW (ref >60)
GFR calc non Af Amer: 39 mL/min — ABNORMAL LOW (ref >60)
GLUCOSE: 110 mg/dL — AB (ref 65–99)
POTASSIUM: 4.1 mmol/L (ref 3.5–5.1)
SODIUM: 136 mmol/L (ref 135–145)
Total Bilirubin: 1.8 mg/dL — ABNORMAL HIGH (ref 0.3–1.2)
Total Protein: 6.1 g/dL — ABNORMAL LOW (ref 6.5–8.1)

## 2016-10-06 LAB — GLUCOSE, CAPILLARY
GLUCOSE-CAPILLARY: 137 mg/dL — AB (ref 65–99)
GLUCOSE-CAPILLARY: 217 mg/dL — AB (ref 65–99)
GLUCOSE-CAPILLARY: 165 mg/dL — AB (ref 65–99)
Glucose-Capillary: 110 mg/dL — ABNORMAL HIGH (ref 65–99)

## 2016-10-06 LAB — GRAM STAIN: GRAM STAIN: NONE SEEN

## 2016-10-06 LAB — HEMOGLOBIN A1C
Hgb A1c MFr Bld: 7.6 % — ABNORMAL HIGH (ref 4.8–5.6)
MEAN PLASMA GLUCOSE: 171 mg/dL

## 2016-10-06 LAB — APTT: aPTT: 36 seconds (ref 24–36)

## 2016-10-06 SURGERY — ERCP, WITH INTERVENTION IF INDICATED
Anesthesia: General

## 2016-10-06 MED ORDER — MIDAZOLAM HCL 2 MG/2ML IJ SOLN
INTRAMUSCULAR | Status: AC | PRN
  Administered 2016-10-06 (×2): 0.5 mg via INTRAVENOUS

## 2016-10-06 MED ORDER — FENTANYL CITRATE (PF) 100 MCG/2ML IJ SOLN
INTRAMUSCULAR | Status: AC
  Administered 2016-10-06: 11:00:00
  Filled 2016-10-06: qty 2

## 2016-10-06 MED ORDER — TRAMADOL HCL 50 MG PO TABS
50.0000 mg | ORAL_TABLET | Freq: Four times a day (QID) | ORAL | Status: DC | PRN
  Administered 2016-10-06 – 2016-10-08 (×3): 50 mg via ORAL
  Filled 2016-10-06 (×3): qty 1

## 2016-10-06 MED ORDER — CEFAZOLIN SODIUM-DEXTROSE 2-4 GM/100ML-% IV SOLN
INTRAVENOUS | Status: AC
  Administered 2016-10-06: 11:00:00
  Filled 2016-10-06: qty 100

## 2016-10-06 MED ORDER — FENTANYL CITRATE (PF) 100 MCG/2ML IJ SOLN
INTRAMUSCULAR | Status: AC | PRN
  Administered 2016-10-06 (×2): 25 ug via INTRAVENOUS

## 2016-10-06 MED ORDER — LIDOCAINE HCL 1 % IJ SOLN
INTRAMUSCULAR | Status: AC
  Administered 2016-10-06: 11:00:00
  Filled 2016-10-06: qty 20

## 2016-10-06 MED ORDER — MEPERIDINE HCL 50 MG/ML IJ SOLN
INTRAMUSCULAR | Status: AC
  Administered 2016-10-06: 12:00:00
  Filled 2016-10-06: qty 1

## 2016-10-06 MED ORDER — MIDAZOLAM HCL 2 MG/2ML IJ SOLN
INTRAMUSCULAR | Status: AC
  Administered 2016-10-06: 11:00:00
  Filled 2016-10-06: qty 2

## 2016-10-06 MED ORDER — ACETAMINOPHEN 500 MG PO TABS: 500.0000 mg | ORAL_TABLET | Freq: Four times a day (QID) | ORAL | Status: DC | PRN

## 2016-10-06 MED ORDER — CYCLOBENZAPRINE HCL 5 MG PO TABS
5.0000 mg | ORAL_TABLET | Freq: Three times a day (TID) | ORAL | Status: DC | PRN
  Administered 2016-10-06 – 2016-10-07 (×2): 5 mg via ORAL
  Filled 2016-10-06 (×2): qty 1

## 2016-10-06 MED ORDER — IOPAMIDOL (ISOVUE-300) INJECTION 61%
INTRAVENOUS | Status: AC
  Administered 2016-10-06: 10 mL
  Filled 2016-10-06: qty 50

## 2016-10-06 MED ORDER — ACETAMINOPHEN 650 MG RE SUPP: 325.0000 mg | Freq: Four times a day (QID) | RECTAL | Status: DC | PRN

## 2016-10-06 MED ORDER — MEPERIDINE HCL 25 MG/ML IJ SOLN
INTRAMUSCULAR | Status: DC | PRN
  Administered 2016-10-06: 12.5 mg via INTRAVENOUS

## 2016-10-06 MED ORDER — MAGIC MOUTHWASH
5.0000 mL | Freq: Three times a day (TID) | ORAL | Status: DC | PRN
  Filled 2016-10-06: qty 5

## 2016-10-06 MED ORDER — LIDOCAINE HCL 1 % IJ SOLN
INTRAMUSCULAR | Status: AC | PRN
  Administered 2016-10-06: 10 mL

## 2016-10-06 NOTE — Progress Notes (Signed)
PT Cancellation Note  Patient Details Name: THIMOTHY REICKS MRN: [id number] DOB: 04/25/1933   Cancelled Treatment:    Reason Eval/Treat Not Completed: Other (comment)   Spoke with RN, who informed me that Mr. Seabold had a difficult night last night, hade the cholecystostomy today, and is finally resting with his pain under control;   Will hold PT eval for now, and plan to follow up for PT eval tomorrow;   Thanks,  Roney [place], [place]  Acute Rehabilitation Services Pager [phone] Office [phone]    Col[place]ta [place] 10/06/2016, 4:27 PM

## 2016-10-06 NOTE — Progress Notes (Signed)
Pt going with transport to xray and placement of drain in IR.

## 2016-10-06 NOTE — Progress Notes (Signed)
[place] TEAM 1 - Stepdown/ICU TEAM  Henry Lindsey  [id number] DOB: 1933/10/08 DOA: 10/05/2016 PCP: [name], MD    Brief Narrative:   82 y.o. male with hypertension, hyperlipidemia, PVD, prior CVA (around 2012), basal cell carcinoma, and DM who presented to [facility] with worsening right upper quadrant abdominal pain. He had been having some nausea and vomiting and discomfort of the abdomen since Thanksgiving, but it progressively worsened. He was found to have acute cholecystitis w/ choledocholithiasis w/ cbd stone.  Transferred to our facility for eval for ERCP.   Subjective: The patient states he is feeling much better status post his cholecystostomy.  He denies chest pain shortness breath fevers chills nausea or vomiting.  Assessment & Plan:  Acute v/s cholecystitis with choledocholithiasis Ongoing care per general surgery and GI - GI to determine if ERCP indicated  Transaminitis Due to above - follow trend  Acute kidney injury Likely pre-renal azotemia - keep hydrated - avoid nephrotoxic meds - follow   Recent Labs Lab 10/04/16 0942 10/05/16 0630 10/06/16 0232  CREATININE 1.76* 1.81* 1.59*    ?RML and LLL pulmonary infiltrate No current symptoms to suggest pneumonia - suspect this is atelectasis - follow clinically  DM 2 Well-controlled at present  HTN Well-controlled at present  HLD Hold treatment until LFTs normalized  CAD Has refused prior cardiac eval therefore current quantification not possible  Peripheral arterial disease S/p angioplasty of the distal right SFA   BPH  Squamous cell carcinoma of skin/nose Stopped oral chemo agent ~Thanksgiving due to GI symptoms which may have actually be related to gallbladder disease  DVT prophylaxis: SCDs Code Status: FULL CODE Family Communication: Spoke with daughter at bedside Disposition Plan: SDU  Consultants:  [facility]  IR  Procedures: 12/17 percutaneous  cholecystostomy tube  Antimicrobials:  Zosyn 12/16 > Vancomycin 12/16  Objective: Blood pressure 97/79, pulse 81, temperature 97.5 F (36.4 C), temperature source Oral, resp. rate (!) 22, height 5' 11.5" (1.816 m), weight 81.6 kg (180 lb), SpO2 96 %.  Intake/Output Summary (Last 24 hours) at 10/06/16 1431 Last data filed at 10/06/16 1300  Gross per 24 hour  Intake              770 ml  Output             3050 ml  Net            -2280 ml   Filed Weights   10/05/16 1322  Weight: 81.6 kg (180 lb)    Examination: General: No acute respiratory distress Lungs: Clear to auscultation bilaterally without wheezes or crackles Cardiovascular: Regular rate and rhythm without murmur gallop or rub normal S1 and S2 Abdomen: Nondistended, soft, bowel sounds positive, no rebound, no ascites, no appreciable mass - dark fluid in drainage bag  Extremities: No significant cyanosis, clubbing, or edema bilateral lower extremities  CBC:  Recent Labs Lab 10/05/16 0630 10/06/16 0232  WBC 20.9* 13.3*  HGB 13.0 11.9*  HCT 39.2* 37.2*  MCV 85.5 86.9  PLT 293 [id number]   Basic Metabolic Panel:  Recent Labs Lab 10/04/16 0942 10/05/16 0630 10/06/16 0232  NA 133* 129* 136  K 4.9 4.5 4.1  CL 93* 94* 103  CO2 32 26 22  GLUCOSE 190* 177* 110*  BUN 30* 30* 15  CREATININE 1.76* 1.81* 1.59*  CALCIUM 9.5 8.9 8.5*   GFR: Estimated Creatinine Clearance: 39.4 mL/min (by C-G formula based on SCr of 1.59 mg/dL (H)).  Liver Function Tests:  Recent Labs Lab 10/04/16 0942 10/05/16 0630 10/06/16 0232  AST 75* 130* 136*  ALT 63* 131* 181*  ALKPHOS 58 59 75  BILITOT 1.1 1.7* 1.8*  PROT 7.1 7.2 6.1*  ALBUMIN 4.7 4.0 2.8*    Recent Labs Lab 10/05/16 0630  LIPASE <10*    Coagulation Profile:  Recent Labs Lab 10/06/16 0232  INR 1.43    Cardiac Enzymes:  Recent Labs Lab 10/04/16 1052  CKTOTAL 153  CKMB 5.1*    HbA1C: Hgb A1c MFr Bld  Date/Time Value Ref Range Status  10/05/2016  02:40 PM 7.6 (H) 4.8 - 5.6 % Final    Comment:    (NOTE)         Pre-diabetes: 5.7 - 6.4         Diabetes: >6.4         Glycemic control for adults with diabetes: <7.0   10/04/2016 09:42 AM 7.9 (H) 4.6 - 6.5 % Final    Comment:    Glycemic Control Guidelines for People with Diabetes:Non Diabetic:  <6%Goal of Therapy: <7%Additional Action Suggested:  >8%     CBG:  Recent Labs Lab 10/05/16 1110 10/05/16 1555 10/05/16 2054 10/06/16 0817 10/06/16 1211  GLUCAP 143* 155* 109* 110* 137*    Recent Results (from the past 240 hour(s))  Blood culture (routine x 2)     Status: None (Preliminary result)   Collection Time: 10/05/16  7:13 AM  Result Value Ref Range Status   Specimen Description BLOOD  RT FOREAM  Final   Special Requests BOTTLES DRAWN AEROBIC AND ANAEROBIC 6CC  Final   Culture NO GROWTH 1 DAY  Final   Report Status PENDING  Incomplete  Blood culture (routine x 2)     Status: None (Preliminary result)   Collection Time: 10/05/16  7:13 AM  Result Value Ref Range Status   Specimen Description BLOOD LEFT IV  Final   Special Requests BOTTLES DRAWN AEROBIC AND ANAEROBIC [place]  Final   Culture NO GROWTH 1 DAY  Final   Report Status PENDING  Incomplete  Urine culture     Status: None   Collection Time: 10/05/16  7:13 AM  Result Value Ref Range Status   Specimen Description URINE, RANDOM  Final   Special Requests NONE  Final   Culture NO GROWTH Performed at [facility]   Final   Report Status 10/06/2016 FINAL  Final  Surgical PCR screen     Status: None   Collection Time: 10/05/16 12:29 PM  Result Value Ref Range Status   MRSA, PCR NEGATIVE NEGATIVE Final   Staphylococcus aureus NEGATIVE NEGATIVE Final    Comment:        The Xpert SA Assay (FDA approved for NASAL specimens in patients over 16 years of age), is one component of a comprehensive surveillance program.  Test performance has been validated by [facility] for patients greater than or equal to 41  year old. It is not intended to diagnose infection nor to guide or monitor treatment.   Gram stain     Status: None   Collection Time: 10/06/16 11:07 AM  Result Value Ref Range Status   Specimen Description GALL BLADDER  Final   Special Requests NONE  Final   Gram Stain NO WBC SEEN NO ORGANISMS SEEN   Final   Report Status 10/06/2016 FINAL  Final     Scheduled Meds: . amLODipine  10 mg Oral Daily  . aspirin EC  81 mg Oral Daily  . atorvastatin  40 mg Oral Daily  . cilostazol  50 mg Oral BID  . finasteride  5 mg Oral Daily  . gabapentin  100 mg Oral TID  . losartan  100 mg Oral Daily   And  . hydrochlorothiazide  25 mg Oral Daily  . indomethacin  100 mg Rectal Once  . indomethacin  100 mg Rectal On Call  . insulin aspart  0-9 Units Subcutaneous TID WC  . latanoprost  1 drop Both Eyes QHS  . metoprolol succinate  100 mg Oral Daily  . pantoprazole  40 mg Oral BID AC  . piperacillin-tazobactam (ZOSYN)  IV  3.375 g Intravenous Q8H  . sodium chloride flush  3 mL Intravenous Q12H  . tamsulosin  0.4 mg Oral QPC breakfast   Continuous Infusions: . sodium chloride 50 mL/hr at 10/06/16 0430  . sodium chloride       LOS: 1 day   Cherene Altes, MD [facility] Office  [phone] Pager - Text Page per Amion as per below:  On-Call/Text Page:      [email or web address]      password [email or web address]  If 7PM-7AM, please contact night-coverage [email or web address] Password [email or web address] 10/06/2016, 2:31 PM

## 2016-10-06 NOTE — Progress Notes (Signed)
Patient was in IR getting cholecystostomy tube, not seen this AM Informed nurse that patient will not need ERCP tomorrow , asked her to relay it to the family and patient. Will reassess tomorrow and decide if he will need one during this admission or follow up as outpatient.  Damaris Hippo , MD [phone] Mon-Fri 8a-5p [phone] after 5p, weekends, holidays

## 2016-10-06 NOTE — Consult Note (Signed)
Chief Complaint: acute cholecystitis  Referring Physician:Dr. Greer Pickerel  Supervising Physician: Aletta Edouard  Patient Status: [name] - In-pt  HPI: Henry Lindsey is an 82 y.o. 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  He was having nausea and vomiting at night and felt it was secondary to this medication.  He stopped the medication and has still has some nausea and vomiting.  He was blowing leaves last Thursday and developed abdominal soreness after he completed this task.  He was still hurting on Friday and he saw his PCP.  His friend, Henry Lindsey, states he had a temp of 101.  His PCP told him to keep an eye but also felt this may be overworked muscles from his activities the day before.  Apparently, he continued to have pain, fevers, and N/V so his friend took him to [facility] yesterday.  He had a CT scan that revealed cholecystitis, CBD stone, WBC of 20K, and elevated LFTs.  He was transferred to [facility] where general surgery evaluated him and felt like secondary to the possible duration of symptoms, he would benefit from a perc chole drain.  We have been consulted for this.  Past Medical History:  Past Medical History:  Diagnosis Date  . BPH (benign prostatic hyperplasia)   . Carotid artery stenosis Feb 2012   <50% bilaterally  . CVA (cerebral infarction) feb 2012   r thalamic lacunar  . Diabetes mellitus   . Hyperlipidemia   . Hypertension   . Neuromuscular disorder ([place])   . Peripheral vascular disease in diabetes mellitus [facility]) Sept 2013    95% occlusion s/p PTCA R SFA Dew Sept 2013    Past Surgical History:  Past Surgical History:  Procedure Laterality Date  . JOINT REPLACEMENT Left 2014   left knee  . UPPER GASTROINTESTINAL ENDOSCOPY      Family History:  Family History  Problem Relation Age of Onset  . Diabetes Mother     Social History:  reports that he quit smoking about 48 years ago. He has never used  smokeless tobacco. He reports that he does not drink alcohol or use drugs.  Allergies: No Known Allergies  Medications: Medications reviewed in Epic  Please HPI for pertinent positives, otherwise complete 10 system ROS negative.  Mallampati Score: MD Evaluation Airway: WNL Heart: WNL Abdomen: WNL Chest/ Lungs: WNL ASA  Classification: 3 Mallampati/Airway Score: Two  Physical Exam: BP 123/76 (BP Location: Left Arm)   Pulse (!) 104   Temp 97.5 F (36.4 C) (Oral)   Resp 14   Ht 5' 11.5" (1.816 m)   Wt 180 lb (81.6 kg)   SpO2 98%   BMI 24.76 kg/m  Body mass index is 24.76 kg/m. General: pleasant, WD, WN white male who is laying in bed in NAD HEENT: head is normocephalic, atraumatic.  Sclera are noninjected.  PERRL.  Ears and nose without any masses or lesions.  Mouth is pink but dry Heart: regular rhythm, but slightly tachycardic.  Normal s1,s2. No obvious murmurs, gallops, or rubs noted.  Palpable radial and pedal pulses bilaterally Lungs: CTAB, no wheezes, rhonchi, or rales noted.  Respiratory effort nonlabored Abd: soft, tender in RUQ, ND, +BS, no masses, hernias, or organomegaly Psych: A&Ox3 with an appropriate affect.   Labs: Results for orders placed or performed during the hospital encounter of 10/05/16 (from the past 48 hour(s))  Surgical PCR screen  Status: None   Collection Time: 10/05/16 12:29 PM  Result Value Ref Range   MRSA, PCR NEGATIVE NEGATIVE   Staphylococcus aureus NEGATIVE NEGATIVE    Comment:        The Xpert SA Assay (FDA approved for NASAL specimens in patients over 22 years of age), is one component of a comprehensive surveillance program.  Test performance has been validated by [facility] for patients greater than or equal to 33 year old. It is not intended to diagnose infection nor to guide or monitor treatment.   Urinalysis, Routine w reflex microscopic     Status: Abnormal   Collection Time: 10/05/16  3:31 PM  Result Value Ref  Range   Color, Urine YELLOW YELLOW   APPearance CLEAR CLEAR   Specific Gravity, Urine 1.012 1.005 - 1.030   pH 7.0 5.0 - 8.0   Glucose, UA 150 (A) NEGATIVE mg/dL   Hgb urine dipstick SMALL (A) NEGATIVE   Bilirubin Urine NEGATIVE NEGATIVE   Ketones, ur NEGATIVE NEGATIVE mg/dL   Protein, ur NEGATIVE NEGATIVE mg/dL   Nitrite NEGATIVE NEGATIVE   Leukocytes, UA NEGATIVE NEGATIVE   RBC / HPF 0-5 0 - 5 RBC/hpf   WBC, UA 0-5 0 - 5 WBC/hpf   Bacteria, UA NONE SEEN NONE SEEN   Squamous Epithelial / LPF NONE SEEN NONE SEEN  Glucose, capillary     Status: Abnormal   Collection Time: 10/05/16  3:55 PM  Result Value Ref Range   Glucose-Capillary 155 (H) 65 - 99 mg/dL   Comment 1 Notify RN   Glucose, capillary     Status: Abnormal   Collection Time: 10/05/16  8:54 PM  Result Value Ref Range   Glucose-Capillary 109 (H) 65 - 99 mg/dL  Comprehensive metabolic panel     Status: Abnormal   Collection Time: 10/06/16  2:32 AM  Result Value Ref Range   Sodium 136 135 - 145 mmol/L   Potassium 4.1 3.5 - 5.1 mmol/L   Chloride 103 101 - 111 mmol/L   CO2 22 22 - 32 mmol/L   Glucose, Bld 110 (H) 65 - 99 mg/dL   BUN 15 6 - 20 mg/dL   Creatinine, Ser 1.59 (H) 0.61 - 1.24 mg/dL   Calcium 8.5 (L) 8.9 - 10.3 mg/dL   Total Protein 6.1 (L) 6.5 - 8.1 g/dL   Albumin 2.8 (L) 3.5 - 5.0 g/dL   AST 136 (H) 15 - 41 U/L   ALT 181 (H) 17 - 63 U/L   Alkaline Phosphatase 75 38 - 126 U/L   Total Bilirubin 1.8 (H) 0.3 - 1.2 mg/dL   GFR calc non Af Amer 39 (L) >60 mL/min   GFR calc Af Amer 45 (L) >60 mL/min    Comment: (NOTE) The eGFR has been calculated using the CKD EPI equation. This calculation has not been validated in all clinical situations. eGFR's persistently <60 mL/min signify possible Chronic Kidney Disease.    Anion gap 11 5 - 15  CBC     Status: Abnormal   Collection Time: 10/06/16  2:32 AM  Result Value Ref Range   WBC 13.3 (H) 4.0 - 10.5 K/uL   RBC 4.28 4.22 - 5.81 MIL/uL   Hemoglobin 11.9 (L)  13.0 - 17.0 g/dL   HCT 37.2 (L) 39.0 - 52.0 %   MCV 86.9 78.0 - 100.0 fL   MCH 27.8 26.0 - 34.0 pg   MCHC 32.0 30.0 - 36.0 g/dL   RDW 13.9 11.5 - 15.5 %  Platelets 226 150 - 400 K/uL  Protime-INR     Status: Abnormal   Collection Time: 10/06/16  2:32 AM  Result Value Ref Range   Prothrombin Time 17.5 (H) 11.4 - 15.2 seconds   INR 1.43   APTT     Status: None   Collection Time: 10/06/16  2:32 AM  Result Value Ref Range   aPTT 36 24 - 36 seconds  Glucose, capillary     Status: Abnormal   Collection Time: 10/06/16  8:17 AM  Result Value Ref Range   Glucose-Capillary 110 (H) 65 - 99 mg/dL    Imaging: Dg Chest 2 View  Result Date: 10/04/2016 CLINICAL DATA:  Hypoxia, chest pain at night. EXAM: CHEST  2 VIEW COMPARISON:  08/07/2015 FINDINGS: Cardiomediastinal silhouette is normal. Mediastinal contours appear intact. Calcific atherosclerotic disease and tortuosity of the aorta are noted. There is no evidence of focal airspace consolidation, pleural effusion or pneumothorax. Osseous structures are without acute abnormality. Ankylosing changes of the thoracic spine are noted. Soft tissues are grossly normal. IMPRESSION: No active cardiopulmonary disease. Calcific atherosclerotic disease of the aorta. Electronically Signed   By: Fidela Salisbury M.D.   On: 10/04/2016 14:49   Ct Chest W Contrast  Result Date: 10/05/2016 CLINICAL DATA:  Nausea, vomiting and right-sided abdominal pain over the last 3 weeks. Hypoxia . EXAM: CT CHEST, ABDOMEN, AND PELVIS WITH CONTRAST TECHNIQUE: Multidetector CT imaging of the chest, abdomen and pelvis was performed following the standard protocol during bolus administration of intravenous contrast. CONTRAST:  55m ISOVUE-300 IOPAMIDOL (ISOVUE-300) INJECTION 61% COMPARISON:  04/10/2016 FINDINGS: CT CHEST FINDINGS Cardiovascular: Heart size is normal. No pericardial fluid. Extensive coronary artery calcification. Aortic atherosclerotic calcification. No pulmonary  arterial lesion seen, though opacity is not sufficient to rule out emboli. Mediastinum/Nodes: No mass or lymphadenopathy. Lungs/Pleura: Mild emphysema and scarring in the upper lobes. Linear markings in the lower lobes probably represent a combination of scarring and atelectasis, particularly in the right lower lobe. In general, the bronchi are large consistent with bronchiectasis. No pleural fluid or pleural lesion. Musculoskeletal: No thoracic fracture. Upper thoracic degenerative disc disease. Mid and lower thoracic ankylosis. CT ABDOMEN PELVIS FINDINGS Hepatobiliary: Liver parenchyma shows mild fatty change. The gallbladder is markedly distended and there is surrounding inflammatory change. There are at least diffuse small stones dependent in the gallbladder. Findings are worrisome for acute cholecystitis by CT. Likely 4-5 mm common duct stone. There could be other noncalcified or less calcified ductal stones as well. Pancreas: Negative Spleen: Normal Adrenals/Urinary Tract: Adrenal glands are normal. Multiple bilateral benign appearing renal cysts. Stomach/Bowel: No primary bowel pathology. Diverticulosis without evidence of diverticulitis. Vascular/Lymphatic: Aortic atherosclerosis, advanced. No aneurysm. IVC is normal. No retroperitoneal adenopathy. Reproductive: Negative Other: No ascites or free air. Musculoskeletal: Extensive chronic degenerative changes of the lumbar spine including 8 mm anterolisthesis L4-5. IMPRESSION: Chest: Underlying emphysema. Underlying bronchiectasis. Scarring in the lower lungs, probably with some atelectasis in the right lower lobe. Extensive coronary artery calcification. Extensive aortic atherosclerosis. Abdomen: Probable acute cholecystitis. The gallbladder is markedly distended and there is surrounding inflammatory change. At least a few small stones dependent in the gallbladder. High suspicion of common duct stone or stones. Advanced aortic atherosclerosis. Diverticulosis  without evidence of diverticulitis. Electronically Signed   By: MNelson ChimesM.D.   On: 10/05/2016 09:14   Ct Abdomen Pelvis W Contrast  Result Date: 10/05/2016 CLINICAL DATA:  Nausea, vomiting and right-sided abdominal pain over the last 3 weeks. Hypoxia . EXAM: CT  CHEST, ABDOMEN, AND PELVIS WITH CONTRAST TECHNIQUE: Multidetector CT imaging of the chest, abdomen and pelvis was performed following the standard protocol during bolus administration of intravenous contrast. CONTRAST:  31m ISOVUE-300 IOPAMIDOL (ISOVUE-300) INJECTION 61% COMPARISON:  04/10/2016 FINDINGS: CT CHEST FINDINGS Cardiovascular: Heart size is normal. No pericardial fluid. Extensive coronary artery calcification. Aortic atherosclerotic calcification. No pulmonary arterial lesion seen, though opacity is not sufficient to rule out emboli. Mediastinum/Nodes: No mass or lymphadenopathy. Lungs/Pleura: Mild emphysema and scarring in the upper lobes. Linear markings in the lower lobes probably represent a combination of scarring and atelectasis, particularly in the right lower lobe. In general, the bronchi are large consistent with bronchiectasis. No pleural fluid or pleural lesion. Musculoskeletal: No thoracic fracture. Upper thoracic degenerative disc disease. Mid and lower thoracic ankylosis. CT ABDOMEN PELVIS FINDINGS Hepatobiliary: Liver parenchyma shows mild fatty change. The gallbladder is markedly distended and there is surrounding inflammatory change. There are at least diffuse small stones dependent in the gallbladder. Findings are worrisome for acute cholecystitis by CT. Likely 4-5 mm common duct stone. There could be other noncalcified or less calcified ductal stones as well. Pancreas: Negative Spleen: Normal Adrenals/Urinary Tract: Adrenal glands are normal. Multiple bilateral benign appearing renal cysts. Stomach/Bowel: No primary bowel pathology. Diverticulosis without evidence of diverticulitis. Vascular/Lymphatic: Aortic  atherosclerosis, advanced. No aneurysm. IVC is normal. No retroperitoneal adenopathy. Reproductive: Negative Other: No ascites or free air. Musculoskeletal: Extensive chronic degenerative changes of the lumbar spine including 8 mm anterolisthesis L4-5. IMPRESSION: Chest: Underlying emphysema. Underlying bronchiectasis. Scarring in the lower lungs, probably with some atelectasis in the right lower lobe. Extensive coronary artery calcification. Extensive aortic atherosclerosis. Abdomen: Probable acute cholecystitis. The gallbladder is markedly distended and there is surrounding inflammatory change. At least a few small stones dependent in the gallbladder. High suspicion of common duct stone or stones. Advanced aortic atherosclerosis. Diverticulosis without evidence of diverticulitis. Electronically Signed   By: MNelson ChimesM.D.   On: 10/05/2016 09:14    Assessment/Plan 1. Acute cholecystitis -we will plan to proceed with perc chole drain placement today -labs and vitals have been reviewed -he is NPO -Risks and Benefits discussed with the patient including, but not limited to bleeding, infection, gallbladder perforation, bile leak, sepsis or even death. All of the patient's questions were answered, patient is agreeable to proceed. Consent signed and in chart.  Thank you for this interesting consult.  I greatly enjoyed meeting WCHRISTROPHER GINTZand look forward to participating in their care.  A copy of this report was sent to the requesting provider on this date.  Electronically Signed: OHenreitta Cea12/17/2017, 9:23 AM   I spent a total of 40 Minutes    in face to face in clinical consultation, greater than 50% of which was counseling/coordinating care for acute cholecystitis

## 2016-10-06 NOTE — Sedation Documentation (Signed)
Patient denies pain and is resting comfortably.  

## 2016-10-06 NOTE — Progress Notes (Signed)
Subjective: Patient just had percutaneous cholecystostomy placed - having some RUQ pain from procedure No nausea currently  Objective: Vital signs in last 24 hours: Temp:  [97.5 F (36.4 C)-99.3 F (37.4 C)] 97.5 F (36.4 C) (12/17 1200) Pulse Rate:  [78-112] 84 (12/17 1200) Resp:  [10-27] 21 (12/17 1200) BP: (93-174)/(48-89) 143/77 (12/17 1200) SpO2:  [87 %-99 %] 94 % (12/17 1200) Weight:  [81.6 kg (180 lb)] 81.6 kg (180 lb) (12/16 1322) Last BM Date: 10/03/16  Intake/Output from previous day: 12/16 0701 - 12/17 0700 In: 712.5 [I.V.:562.5; IV Piggyback:150] Out: 1325 [Urine:1325] Intake/Output this shift: Total I/O In: -  Out: 1700 [Urine:1400; Drains:300]  General appearance: alert, cooperative and no distress GI: soft, tender in RUQ Percutaneous cholecystostomy with large amount of dark bile in bag  Lab Results:   Recent Labs  10/05/16 0630 10/06/16 0232  WBC 20.9* 13.3*  HGB 13.0 11.9*  HCT 39.2* 37.2*  PLT 293 226   BMET  Recent Labs  10/05/16 0630 10/06/16 0232  NA 129* 136  K 4.5 4.1  CL 94* 103  CO2 26 22  GLUCOSE 177* 110*  BUN 30* 15  CREATININE 1.81* 1.59*  CALCIUM 8.9 8.5*   Hepatic Function Latest Ref Rng & Units 10/06/2016 10/05/2016 10/04/2016  Total Protein 6.5 - 8.1 g/dL 6.1(L) 7.2 7.1  Albumin 3.5 - 5.0 g/dL 2.8(L) 4.0 4.7  AST 15 - 41 U/L 136(H) 130(H) 75(H)  ALT 17 - 63 U/L 181(H) 131(H) 63(H)  Alk Phosphatase 38 - 126 U/L 75 59 58  Total Bilirubin 0.3 - 1.2 mg/dL 1.8(H) 1.7(H) 1.1    PT/INR  Recent Labs  10/06/16 0232  LABPROT 17.5*  INR 1.43   ABG No results for input(s): PHART, HCO3 in the last 72 hours.  Invalid input(s): PCO2, PO2  Studies/Results: X-ray Chest Pa And Lateral  Result Date: 10/06/2016 CLINICAL DATA:  Cholecystitis. EXAM: CHEST  2 VIEW COMPARISON:  10/04/2016 FINDINGS: Right basilar airspace opacity noted, anteriorly on the lateral view compatible with right middle lobe atelectasis or  pneumonia. Minimal left base atelectasis or scarring. No effusions. Heart is normal size. IMPRESSION: Right middle lobe atelectasis or pneumonia. Minimal left base atelectasis or scarring. Electronically Signed   By: Rolm Baptise M.D.   On: 10/06/2016 10:49   Dg Chest 2 View  Result Date: 10/04/2016 CLINICAL DATA:  Hypoxia, chest pain at night. EXAM: CHEST  2 VIEW COMPARISON:  08/07/2015 FINDINGS: Cardiomediastinal silhouette is normal. Mediastinal contours appear intact. Calcific atherosclerotic disease and tortuosity of the aorta are noted. There is no evidence of focal airspace consolidation, pleural effusion or pneumothorax. Osseous structures are without acute abnormality. Ankylosing changes of the thoracic spine are noted. Soft tissues are grossly normal. IMPRESSION: No active cardiopulmonary disease. Calcific atherosclerotic disease of the aorta. Electronically Signed   By: Fidela Salisbury M.D.   On: 10/04/2016 14:49   Ct Chest W Contrast  Result Date: 10/05/2016 CLINICAL DATA:  Nausea, vomiting and right-sided abdominal pain over the last 3 weeks. Hypoxia . EXAM: CT CHEST, ABDOMEN, AND PELVIS WITH CONTRAST TECHNIQUE: Multidetector CT imaging of the chest, abdomen and pelvis was performed following the standard protocol during bolus administration of intravenous contrast. CONTRAST:  81m ISOVUE-300 IOPAMIDOL (ISOVUE-300) INJECTION 61% COMPARISON:  04/10/2016 FINDINGS: CT CHEST FINDINGS Cardiovascular: Heart size is normal. No pericardial fluid. Extensive coronary artery calcification. Aortic atherosclerotic calcification. No pulmonary arterial lesion seen, though opacity is not sufficient to rule out emboli. Mediastinum/Nodes: No mass or lymphadenopathy. Lungs/Pleura:  Mild emphysema and scarring in the upper lobes. Linear markings in the lower lobes probably represent a combination of scarring and atelectasis, particularly in the right lower lobe. In general, the bronchi are large consistent  with bronchiectasis. No pleural fluid or pleural lesion. Musculoskeletal: No thoracic fracture. Upper thoracic degenerative disc disease. Mid and lower thoracic ankylosis. CT ABDOMEN PELVIS FINDINGS Hepatobiliary: Liver parenchyma shows mild fatty change. The gallbladder is markedly distended and there is surrounding inflammatory change. There are at least diffuse small stones dependent in the gallbladder. Findings are worrisome for acute cholecystitis by CT. Likely 4-5 mm common duct stone. There could be other noncalcified or less calcified ductal stones as well. Pancreas: Negative Spleen: Normal Adrenals/Urinary Tract: Adrenal glands are normal. Multiple bilateral benign appearing renal cysts. Stomach/Bowel: No primary bowel pathology. Diverticulosis without evidence of diverticulitis. Vascular/Lymphatic: Aortic atherosclerosis, advanced. No aneurysm. IVC is normal. No retroperitoneal adenopathy. Reproductive: Negative Other: No ascites or free air. Musculoskeletal: Extensive chronic degenerative changes of the lumbar spine including 8 mm anterolisthesis L4-5. IMPRESSION: Chest: Underlying emphysema. Underlying bronchiectasis. Scarring in the lower lungs, probably with some atelectasis in the right lower lobe. Extensive coronary artery calcification. Extensive aortic atherosclerosis. Abdomen: Probable acute cholecystitis. The gallbladder is markedly distended and there is surrounding inflammatory change. At least a few small stones dependent in the gallbladder. High suspicion of common duct stone or stones. Advanced aortic atherosclerosis. Diverticulosis without evidence of diverticulitis. Electronically Signed   By: Nelson Chimes M.D.   On: 10/05/2016 09:14   Ct Abdomen Pelvis W Contrast  Result Date: 10/05/2016 CLINICAL DATA:  Nausea, vomiting and right-sided abdominal pain over the last 3 weeks. Hypoxia . EXAM: CT CHEST, ABDOMEN, AND PELVIS WITH CONTRAST TECHNIQUE: Multidetector CT imaging of the chest,  abdomen and pelvis was performed following the standard protocol during bolus administration of intravenous contrast. CONTRAST:  31m ISOVUE-300 IOPAMIDOL (ISOVUE-300) INJECTION 61% COMPARISON:  04/10/2016 FINDINGS: CT CHEST FINDINGS Cardiovascular: Heart size is normal. No pericardial fluid. Extensive coronary artery calcification. Aortic atherosclerotic calcification. No pulmonary arterial lesion seen, though opacity is not sufficient to rule out emboli. Mediastinum/Nodes: No mass or lymphadenopathy. Lungs/Pleura: Mild emphysema and scarring in the upper lobes. Linear markings in the lower lobes probably represent a combination of scarring and atelectasis, particularly in the right lower lobe. In general, the bronchi are large consistent with bronchiectasis. No pleural fluid or pleural lesion. Musculoskeletal: No thoracic fracture. Upper thoracic degenerative disc disease. Mid and lower thoracic ankylosis. CT ABDOMEN PELVIS FINDINGS Hepatobiliary: Liver parenchyma shows mild fatty change. The gallbladder is markedly distended and there is surrounding inflammatory change. There are at least diffuse small stones dependent in the gallbladder. Findings are worrisome for acute cholecystitis by CT. Likely 4-5 mm common duct stone. There could be other noncalcified or less calcified ductal stones as well. Pancreas: Negative Spleen: Normal Adrenals/Urinary Tract: Adrenal glands are normal. Multiple bilateral benign appearing renal cysts. Stomach/Bowel: No primary bowel pathology. Diverticulosis without evidence of diverticulitis. Vascular/Lymphatic: Aortic atherosclerosis, advanced. No aneurysm. IVC is normal. No retroperitoneal adenopathy. Reproductive: Negative Other: No ascites or free air. Musculoskeletal: Extensive chronic degenerative changes of the lumbar spine including 8 mm anterolisthesis L4-5. IMPRESSION: Chest: Underlying emphysema. Underlying bronchiectasis. Scarring in the lower lungs, probably with some  atelectasis in the right lower lobe. Extensive coronary artery calcification. Extensive aortic atherosclerosis. Abdomen: Probable acute cholecystitis. The gallbladder is markedly distended and there is surrounding inflammatory change. At least a few small stones dependent in the gallbladder. High suspicion of common  duct stone or stones. Advanced aortic atherosclerosis. Diverticulosis without evidence of diverticulitis. Electronically Signed   By: Nelson Chimes M.D.   On: 10/05/2016 09:14    Anti-infectives: Anti-infectives    Start     Dose/Rate Route Frequency Ordered Stop   10/06/16 1105  ceFAZolin (ANCEF) 2-4 GM/100ML-% IVPB    Comments:  Novella Rob   : cabinet override      10/06/16 1105 10/06/16 1115   10/05/16 1500  piperacillin-tazobactam (ZOSYN) IVPB 3.375 g     3.375 g 12.5 mL/hr over 240 Minutes Intravenous Every 8 hours 10/05/16 1310     10/05/16 1315  piperacillin-tazobactam (ZOSYN) IVPB 3.375 g  Status:  Discontinued     3.375 g 12.5 mL/hr over 240 Minutes Intravenous Every 8 hours 10/05/16 1302 10/05/16 1310      Assessment/Plan: Acute calculus cholecystitis - s/p percutaneous cholecystostomy; will need laparoscopic cholecystectomy in the future Elevated LFTs - not changed significantly from yesterday Possible choledocholithiasis - decision per GI; if no ERCP today, then may begin clear liquids Type 2 diabetes mellitus Hyperlipidemia Hypertension Peripheral arterial disease, history of angioplasty History of CVA  LOS: 1 day    Kenyotta Dorfman K. 10/06/2016

## 2016-10-07 ENCOUNTER — Inpatient Hospital Stay (HOSPITAL_COMMUNITY): Payer: Medicare Other

## 2016-10-07 DIAGNOSIS — K8050 Calculus of bile duct without cholangitis or cholecystitis without obstruction: Secondary | ICD-10-CM

## 2016-10-07 DIAGNOSIS — Z794 Long term (current) use of insulin: Secondary | ICD-10-CM

## 2016-10-07 DIAGNOSIS — K810 Acute cholecystitis: Secondary | ICD-10-CM

## 2016-10-07 DIAGNOSIS — E1121 Type 2 diabetes mellitus with diabetic nephropathy: Secondary | ICD-10-CM

## 2016-10-07 DIAGNOSIS — K819 Cholecystitis, unspecified: Secondary | ICD-10-CM

## 2016-10-07 LAB — COMPREHENSIVE METABOLIC PANEL
ALBUMIN: 2.7 g/dL — AB (ref 3.5–5.0)
ALK PHOS: 69 U/L (ref 38–126)
ALT: 111 U/L — ABNORMAL HIGH (ref 17–63)
ANION GAP: 7 (ref 5–15)
AST: 53 U/L — ABNORMAL HIGH (ref 15–41)
BILIRUBIN TOTAL: 1.1 mg/dL (ref 0.3–1.2)
BUN: 18 mg/dL (ref 6–20)
CO2: 26 mmol/L (ref 22–32)
Calcium: 8.4 mg/dL — ABNORMAL LOW (ref 8.9–10.3)
Chloride: 101 mmol/L (ref 101–111)
Creatinine, Ser: 1.64 mg/dL — ABNORMAL HIGH (ref 0.61–1.24)
GFR calc Af Amer: 44 mL/min — ABNORMAL LOW (ref >60)
GFR, EST NON AFRICAN AMERICAN: 38 mL/min — AB (ref >60)
GLUCOSE: 141 mg/dL — AB (ref 65–99)
Potassium: 4.1 mmol/L (ref 3.5–5.1)
Sodium: 134 mmol/L — ABNORMAL LOW (ref 135–145)
TOTAL PROTEIN: 6.2 g/dL — AB (ref 6.5–8.1)

## 2016-10-07 LAB — CBC
HCT: 34.9 % — ABNORMAL LOW (ref 39.0–52.0)
HEMOGLOBIN: 11.1 g/dL — AB (ref 13.0–17.0)
MCH: 27.5 pg (ref 26.0–34.0)
MCHC: 31.8 g/dL (ref 30.0–36.0)
MCV: 86.6 fL (ref 78.0–100.0)
PLATELETS: 236 10*3/uL (ref 150–400)
RBC: 4.03 MIL/uL — ABNORMAL LOW (ref 4.22–5.81)
RDW: 13.7 % (ref 11.5–15.5)
WBC: 8.7 10*3/uL (ref 4.0–10.5)

## 2016-10-07 LAB — GLUCOSE, CAPILLARY
GLUCOSE-CAPILLARY: 144 mg/dL — AB (ref 65–99)
GLUCOSE-CAPILLARY: 169 mg/dL — AB (ref 65–99)
Glucose-Capillary: 235 mg/dL — ABNORMAL HIGH (ref 65–99)
Glucose-Capillary: 220 mg/dL — ABNORMAL HIGH (ref 65–99)

## 2016-10-07 MED ORDER — DM-GUAIFENESIN ER 30-600 MG PO TB12
1.0000 | ORAL_TABLET | Freq: Two times a day (BID) | ORAL | Status: DC
  Administered 2016-10-07 – 2016-10-08 (×2): 1 via ORAL
  Filled 2016-10-07 (×4): qty 1

## 2016-10-07 MED ORDER — GLUCERNA SHAKE PO LIQD
237.0000 mL | Freq: Two times a day (BID) | ORAL | Status: DC
  Administered 2016-10-08: 237 mL via ORAL

## 2016-10-07 MED ORDER — METOPROLOL SUCCINATE ER 50 MG PO TB24
50.0000 mg | ORAL_TABLET | Freq: Every day | ORAL | Status: DC
Start: 2016-10-07 — End: 2016-10-08
  Administered 2016-10-07 – 2016-10-08 (×2): 50 mg via ORAL
  Filled 2016-10-07 (×2): qty 1

## 2016-10-07 NOTE — Progress Notes (Signed)
[facility] Progress Note     Subjective: Pt states pain around site of drain. No abdominal pain. States he is feeling much better. No nausea, vomiting, fever or chills. No events overnight.   Objective: Vital signs in last 24 hours: Temp:  [97.3 F (36.3 C)-98.6 F (37 C)] 97.3 F (36.3 C) (12/18 0817) Pulse Rate:  [69-100] 69 (12/18 0900) Resp:  [14-27] 14 (12/18 0900) BP: (95-174)/(52-89) 123/64 (12/18 0817) SpO2:  [87 %-99 %] 96 % (12/18 0900) Last BM Date: 10/03/16  Intake/Output from previous day: 12/17 0701 - 12/18 0700 In: 2150 [P.O.:600; I.V.:1400; IV Piggyback:150] Out: 4150 [Urine:2800; Drains:1350] Intake/Output this shift: Total I/O In: 240 [P.O.:240] Out: 350 [Urine:200; Drains:150]  PE: Gen:  Alert, NAD, pleasant, cooperative elderly man up in the chair eating breakfast Card:  RRR, no M/G/R heard Pulm:  effort normal Abd: Soft, NT/ND, +BS,drain site C/D/I, drain with greenish/brown bilious drainage Skin: no rashes noted, warm and dry  Lab Results:   Recent Labs  10/06/16 0232 10/07/16 0220  WBC 13.3* 8.7  HGB 11.9* 11.1*  HCT 37.2* 34.9*  PLT 226 236   BMET  Recent Labs  10/06/16 0232 10/07/16 0220  NA 136 134*  K 4.1 4.1  CL 103 101  CO2 22 26  GLUCOSE 110* 141*  BUN 15 18  CREATININE 1.59* 1.64*  CALCIUM 8.5* 8.4*   PT/INR  Recent Labs  10/06/16 0232  LABPROT 17.5*  INR 1.43   CMP     Component Value Date/Time   NA 134 (L) 10/07/2016 0220   NA 136 08/27/2012 0453   K 4.1 10/07/2016 0220   K 4.6 08/27/2012 0453   CL 101 10/07/2016 0220   CL 103 08/27/2012 0453   CO2 26 10/07/2016 0220   CO2 27 08/27/2012 0453   GLUCOSE 141 (H) 10/07/2016 0220   GLUCOSE 144 (H) 08/27/2012 0453   BUN 18 10/07/2016 0220   BUN 20 (H) 08/27/2012 0453   CREATININE 1.64 (H) 10/07/2016 0220   CREATININE 1.07 08/27/2012 0453   CREATININE 1.19 04/07/2012 1619   CALCIUM 8.4 (L) 10/07/2016 0220   CALCIUM 7.9 (L) 08/27/2012 0453    PROT 6.2 (L) 10/07/2016 0220   PROT 7.0 03/29/2012 1605   ALBUMIN 2.7 (L) 10/07/2016 0220   ALBUMIN 3.5 03/29/2012 1605   AST 53 (H) 10/07/2016 0220   AST 18 03/29/2012 1605   ALT 111 (H) 10/07/2016 0220   ALT 25 03/29/2012 1605   ALKPHOS 69 10/07/2016 0220   ALKPHOS 74 03/29/2012 1605   BILITOT 1.1 10/07/2016 0220   BILITOT 0.7 03/29/2012 1605   GFRNONAA 38 (L) 10/07/2016 0220   GFRNONAA >60 08/27/2012 0453   GFRNONAA 59 (L) 04/07/2012 1619   GFRAA 44 (L) 10/07/2016 0220   GFRAA >60 08/27/2012 0453   GFRAA 68 04/07/2012 1619   Lipase     Component Value Date/Time   LIPASE <10 (L) 10/05/2016 0630       Studies/Results: X-ray Chest Pa And Lateral  Result Date: 10/06/2016 CLINICAL DATA:  Cholecystitis. EXAM: CHEST  2 VIEW COMPARISON:  10/04/2016 FINDINGS: Right basilar airspace opacity noted, anteriorly on the lateral view compatible with right middle lobe atelectasis or pneumonia. Minimal left base atelectasis or scarring. No effusions. Heart is normal size. IMPRESSION: Right middle lobe atelectasis or pneumonia. Minimal left base atelectasis or scarring. Electronically Signed   By: Rolm Baptise M.D.   On: 10/06/2016 10:49   Ir Perc Cholecystostomy  Result Date: 10/06/2016 INDICATION:  Acute cholecystitis and need for percutaneous cholecystostomy due to contraindication currently for cholecystectomy. EXAM: CHOLECYSTOSTOMY MEDICATIONS: The patient had received scheduled IV Zosyn approximately 4 hours prior to the procedure. ANESTHESIA/SEDATION: Moderate (conscious) sedation was employed during this procedure. A total of Versed 2.0 mg and Fentanyl 100 mcg was administered intravenously. Moderate Sedation Time: 10 minutes. The patient's level of consciousness and vital signs were monitored continuously by radiology nursing throughout the procedure under my direct supervision. FLUOROSCOPY TIME:  Fluoroscopy Time: 42 seconds. (5.0 mGy). COMPLICATIONS: None immediate. PROCEDURE:  Informed written consent was obtained from the patient after a thorough discussion of the procedural risks, benefits and alternatives. All questions were addressed. Maximal Sterile Barrier Technique was utilized including caps, mask, sterile gowns, sterile gloves, sterile drape, hand hygiene and skin antiseptic. A timeout was performed prior to the initiation of the procedure. Ultrasound was used to localize the gallbladder. Under direct ultrasound guidance, a 21 gauge needle was advanced into the gallbladder lumen. Ultrasound image documentation was performed. A bile sample was aspirated. A transitional dilator was advanced over the guidewire. The percutaneous tract was dilated and a 10 French pigtail drainage catheter advanced into the gallbladder lumen. Catheter position was confirmed by fluoroscopy after contrast injection. The catheter was connected to a gravity bag. The catheter was secured at the skin with a Prolene retention suture and StatLock device. FINDINGS: The gallbladder is very distended. After placement of the cholecystostomy tube, there is good drainage of dark colored bile. IMPRESSION: Percutaneous cholecystostomy with placement of 10 French drainage catheter into the gallbladder lumen. This catheter was connected to gravity bag drainage. Electronically Signed   By: Aletta Edouard M.D.   On: 10/06/2016 16:55    Anti-infectives: Anti-infectives    Start     Dose/Rate Route Frequency Ordered Stop   10/06/16 1105  ceFAZolin (ANCEF) 2-4 GM/100ML-% IVPB    Comments:  Novella Rob   : cabinet override      10/06/16 1105 10/06/16 1115   10/05/16 1500  piperacillin-tazobactam (ZOSYN) IVPB 3.375 g     3.375 g 12.5 mL/hr over 240 Minutes Intravenous Every 8 hours 10/05/16 1310     10/05/16 1315  piperacillin-tazobactam (ZOSYN) IVPB 3.375 g  Status:  Discontinued     3.375 g 12.5 mL/hr over 240 Minutes Intravenous Every 8 hours 10/05/16 1302 10/05/16 1310       Assessment/Plan  Acute  calculus cholecystitis - s/p percutaneous cholecystostomy; 12/17, IR; will need laparoscopic cholecystectomy in the future  Elevated LFTs - trending down Possible choledocholithiasis - GI will do an MRCP today and if needed ERCP tomorrow Type 2 diabetes mellitus Hyperlipidemia Hypertension Peripheral arterial disease, history of angioplasty History of CVA  FEN: NPO after 1pm today Per GI with MRCP today  Dispo: PT with perc chole drain placed by IR yesterday, MRCP today with possible ERCP tomorrow, pt will need his gallbladder out in the future. WBC normal, LFT's trending down, afebrile. We will continue to follow.   LOS: 2 days    Kalman Drape , [facility] 10/07/2016, 9:51 AM Pager: [phone] Consults: [phone] Mon-Fri 7:00 am-4:30 pm Sat-Sun 7:00 am-11:30 am

## 2016-10-07 NOTE — Evaluation (Signed)
Physical Therapy Evaluation Patient Details Name: Henry Lindsey MRN: [id number] DOB: Apr 25, 1933 Today's Date: 10/07/2016   History of Present Illness  Henry Lindsey is a 82 y.o. male with a Past Medical History of  hypertension, DM with neuropathy, PVD, prior CVA (around 2012), basal cell carcinoma, sp recent treatment,  as well as diabetes presents with worsening right upper quadrant abdominal pain; cholecystitis, now s/p perc cholecystostomy with drain placed.  Clinical Impression  Pt admitted with above diagnosis and presents to PT with functional limitations due to deficits listed below (See PT problem list). Pt needs skilled PT to maximize independence and safety to allow discharge to home with friend. Pt should be able to return home without difficulty.      Follow Up Recommendations No PT follow up    Equipment Recommendations  None recommended by PT    Recommendations for Other Services       Precautions / Restrictions Precautions Precautions: None Restrictions Weight Bearing Restrictions: No      Mobility  Bed Mobility Overal bed mobility: Modified Independent             General bed mobility comments: Incr time  Transfers Overall transfer level: Modified independent Equipment used: None                Ambulation/Gait Ambulation/Gait assistance: Min guard Ambulation Distance (Feet): 200 Feet Assistive device: None Gait Pattern/deviations: Step-through pattern;Decreased stride length;Wide base of support     General Gait Details: Assist for safety. Pt slightly unsteady but no overt loss of balance. Reports being "tender footed" and likes to walk in his shoes (which are not here) or two pairs of sock. Removed O2 for amb and SpO2 96% or greater on RA. Left O2 off.  Stairs            Wheelchair Mobility    Modified Rankin (Stroke Patients Only)       Balance Overall balance assessment: Needs assistance Sitting-balance support: No upper  extremity supported;Feet supported Sitting balance-Leahy Scale: Normal     Standing balance support: No upper extremity supported;During functional activity Standing balance-Leahy Scale: Good                               Pertinent Vitals/Pain Pain Assessment: No/denies pain    Home Living Family/patient expects to be discharged to:: Private residence Living Arrangements: Non-relatives/Friends Available Help at Discharge: Friend(s) Type of Home: House       Home Layout: Two level;Bed/bath upstairs Home Equipment: None      Prior Function Level of Independence: Independent               Hand Dominance        Extremity/Trunk Assessment   Upper Extremity Assessment Upper Extremity Assessment: Overall WFL for tasks assessed    Lower Extremity Assessment Lower Extremity Assessment: Overall WFL for tasks assessed       Communication   Communication: No difficulties  Cognition Arousal/Alertness: Awake/alert Behavior During Therapy: WFL for tasks assessed/performed Overall Cognitive Status: Within Functional Limits for tasks assessed                      General Comments      Exercises     Assessment/Plan    PT Assessment Patient needs continued PT services  PT Problem List Decreased balance;Decreased mobility          PT Treatment Interventions Gait  training;Functional mobility training;Balance training;Therapeutic activities;Therapeutic exercise;Patient/family education    PT Goals (Current goals can be found in the Care Plan section)  Acute Rehab PT Goals Patient Stated Goal: return home PT Goal Formulation: With patient Time For Goal Achievement: 10/12/16 Potential to Achieve Goals: Good    Frequency Min 3X/week   Barriers to discharge        Co-evaluation               End of Session   Activity Tolerance: Patient tolerated treatment well Patient left: in chair;with call bell/phone within reach;with  nursing/sitter in room Nurse Communication: Mobility status;Other (comment) (Left O2 off)         Time: [id number] PT Time Calculation (min) (ACUTE ONLY): 14 min   Charges:   PT Evaluation $PT Eval Moderate Complexity: 1 Procedure     PT G CodesShary Decamp Lindsey 2016-10-18, 10:08 AM  Henry Lindsey PT [phone]

## 2016-10-07 NOTE — Progress Notes (Signed)
Referring Physician(s): Dr Jonny Ruiz  Supervising Physician: Marybelle Killings  Patient Status:  [facility] - In-pt  Chief Complaint:  Chole drain placed 12/17  Subjective:  Acute cholecystitis Drain placed 12/17 in IR Draining well Feels better already Up in bed Plan to eat today Tx to 6N per RN  Allergies: Patient has no known allergies.  Medications: Prior to Admission medications   Medication Sig Start Date End Date Taking? Authorizing Provider  Alum & Mag Hydroxide-Simeth (MAGIC MOUTHWASH) SOLN Take 5 mLs by mouth 3 (three) times daily as needed for mouth pain.    Historical Provider, MD  amLODipine (NORVASC) 10 MG tablet TAKE 1 TABLET BY MOUTH EVERY DAY 09/26/16   [name], MD  aspirin EC 81 MG tablet Take 1 tablet (81 mg total) by mouth daily. 11/03/13   [name], MD  atorvastatin (LIPITOR) 40 MG tablet TAKE 1 TABLET BY MOUTH EVERY DAY 07/15/16   [name], MD  Blood Glucose Monitoring Suppl (ONE TOUCH ULTRA SYSTEM KIT) w/Device KIT 1 kit by Does not apply route once. Use DX code E11.59 10/18/15   [name], MD  cilostazol (PLETAL) 100 MG tablet TAKE 1 TABLET(100 MG) BY MOUTH TWICE DAILY 08/13/16   [name], MD  cyclobenzaprine (FLEXERIL) 5 MG tablet Take 1 tablet (5 mg total) by mouth 3 (three) times daily as needed for muscle spasms. 09/10/16   [name], MD  ERIVEDGE 150 MG capsule Take 1 tablet by mouth daily. 08/23/16   Historical Provider, MD  famotidine (PEPCID) 20 MG tablet TAKE 1 TABLET(20 MG) BY MOUTH TWICE DAILY 05/08/16   [name], MD  fenofibrate micronized (LOFIBRA) 134 MG capsule TAKE 1 CAPSULE BY MOUTH EVERY MORNING BEFORE BREAKFAST 08/28/16   [name], MD  finasteride (PROSCAR) 5 MG tablet Take 1 tablet (5 mg total) by mouth daily. 05/04/15   [name], MD  gabapentin (NEURONTIN) 400 MG capsule TAKE 1 CAPSULE(400 MG) BY MOUTH THREE TIMES DAILY 09/09/16   [name], MD  glipiZIDE (GLUCOTROL) 10 MG tablet TAKE 1  TABLET(10 MG) BY MOUTH TWICE DAILY 03/27/16   [name], MD  glucose blood test strip Use as instructed three times daily 05/03/16   [name], MD  HYDROcodone-acetaminophen (NORCO/VICODIN) 5-325 MG tablet One tablet two times daily as needed for knee pain 09/03/16   [name], MD  INS SYRINGE/NEEDLE 1CC/28G (B-D INSULIN SYRINGE 1CC/28G) 28G X 1/2" 1 ML MISC USE TO ADMINISTER INSULIN DAILY 02/13/16   [name], MD  insulin lispro protamine-lispro (HUMALOG MIX 75/25) (75-25) 100 UNIT/ML SUSP injection Inject 10 units right before  breakfast and 6 units before dinner 05/03/16   [name], MD  INSULIN SYRINGE .5CC/29G 29G X 1/2" 0.5 ML MISC 1 Syringe by Does not apply route 3 (three) times daily. 08/05/11   [name], MD  Insulin Syringe-Needle U-100 (INSULIN SYRINGE .5CC/31GX5/16") 31G X 5/16" 0.5 ML MISC USE THREE TIMES DAILY 03/18/13   [name], MD  JANUVIA 100 MG tablet TAKE 1 TABLET(100 MG) BY MOUTH DAILY. NOTE DOSE INCREASE 08/13/16   [name], MD  latanoprost (XALATAN) 0.005 % ophthalmic solution INSTILL 1 DROP INTO BOTH EYES QD 11/01/15   Historical Provider, MD  losartan-hydrochlorothiazide (HYZAAR) 100-25 MG tablet TAKE 1 TABLET BY MOUTH EVERY DAY 09/26/16   [name], MD  meloxicam (MOBIC) 15 MG tablet TAKE 1 TABLET(15 MG) BY  MOUTH DAILY 03/15/16   [name], MD  metFORMIN (GLUCOPHAGE) 1000 MG tablet TAKE 1 TABLET BY MOUTH TWICE DAILY 07/26/16   [name], MD  metoprolol succinate (TOPROL-XL) 100 MG 24 hr tablet TAKE 1 TABLET BY MOUTH ONCE DAILY 09/03/13   [name], MD  nitroGLYCERIN (NITROSTAT) 0.4 MG SL tablet Place 1 tablet (0.4 mg total) under the tongue every 5 (five) minutes as needed for chest pain. MAXIMUM 3 TABLETS 10/04/16   [name], MD  ondansetron (ZOFRAN) 4 MG tablet Take 1 tablet (4 mg total) by mouth every 8 (eight) hours as needed for nausea or vomiting. 09/03/16   [name], MD  [facility] DELICA LANCETS 53Z MISC  Use three times daily to check blood sugar. 10/18/15   [name], MD  pantoprazole (PROTONIX) 40 MG tablet Take 1 tablet (40 mg total) by mouth 2 (two) times daily before a meal. 10/04/16   [name], MD  tamsulosin (FLOMAX) 0.4 MG CAPS capsule Take 0.4 mg by mouth daily after breakfast.    Historical Provider, MD  traMADol (ULTRAM) 50 MG tablet Take 50 mg by mouth every 6 (six) hours as needed.  11/24/13   Historical Provider, MD     Vital Signs: BP 123/64 (BP Location: Right Arm)   Pulse 71   Temp 97.3 F (36.3 C) (Oral)   Resp 18   Ht 5' 11.5" (1.816 m)   Wt 180 lb (81.6 kg)   SpO2 96%   BMI 24.76 kg/m   Physical Exam  Constitutional: He is oriented to person, place, and time. He appears well-nourished.  Cardiovascular: Normal rate.   Abdominal: Soft. Bowel sounds are normal.  Musculoskeletal: Normal range of motion.  Neurological: He is alert and oriented to person, place, and time.  Skin: Skin is warm and dry.  Site of drain is clean and dry NT no bleeding Output over 1 L yesterday 150 cc bilious fluid Cx pending  Wbc wnl afeb   Psychiatric: He has a normal mood and affect. His behavior is normal.  Nursing note and vitals reviewed.   Imaging: X-ray Chest Pa And Lateral  Result Date: 10/06/2016 CLINICAL DATA:  Cholecystitis. EXAM: CHEST  2 VIEW COMPARISON:  10/04/2016 FINDINGS: Right basilar airspace opacity noted, anteriorly on the lateral view compatible with right middle lobe atelectasis or pneumonia. Minimal left base atelectasis or scarring. No effusions. Heart is normal size. IMPRESSION: Right middle lobe atelectasis or pneumonia. Minimal left base atelectasis or scarring. Electronically Signed   By: Rolm Baptise M.D.   On: 10/06/2016 10:49   Dg Chest 2 View  Result Date: 10/04/2016 CLINICAL DATA:  Hypoxia, chest pain at night. EXAM: CHEST  2 VIEW COMPARISON:  08/07/2015 FINDINGS: Cardiomediastinal silhouette is normal. Mediastinal contours appear  intact. Calcific atherosclerotic disease and tortuosity of the aorta are noted. There is no evidence of focal airspace consolidation, pleural effusion or pneumothorax. Osseous structures are without acute abnormality. Ankylosing changes of the thoracic spine are noted. Soft tissues are grossly normal. IMPRESSION: No active cardiopulmonary disease. Calcific atherosclerotic disease of the aorta. Electronically Signed   By: Fidela Salisbury M.D.   On: 10/04/2016 14:49   Ct Chest W Contrast  Result Date: 10/05/2016 CLINICAL DATA:  Nausea, vomiting and right-sided abdominal pain over the last 3 weeks. Hypoxia . EXAM: CT CHEST, ABDOMEN, AND PELVIS WITH CONTRAST TECHNIQUE: Multidetector CT imaging of the chest, abdomen and pelvis was performed following the standard protocol during bolus  administration of intravenous contrast. CONTRAST:  45m ISOVUE-300 IOPAMIDOL (ISOVUE-300) INJECTION 61% COMPARISON:  04/10/2016 FINDINGS: CT CHEST FINDINGS Cardiovascular: Heart size is normal. No pericardial fluid. Extensive coronary artery calcification. Aortic atherosclerotic calcification. No pulmonary arterial lesion seen, though opacity is not sufficient to rule out emboli. Mediastinum/Nodes: No mass or lymphadenopathy. Lungs/Pleura: Mild emphysema and scarring in the upper lobes. Linear markings in the lower lobes probably represent a combination of scarring and atelectasis, particularly in the right lower lobe. In general, the bronchi are large consistent with bronchiectasis. No pleural fluid or pleural lesion. Musculoskeletal: No thoracic fracture. Upper thoracic degenerative disc disease. Mid and lower thoracic ankylosis. CT ABDOMEN PELVIS FINDINGS Hepatobiliary: Liver parenchyma shows mild fatty change. The gallbladder is markedly distended and there is surrounding inflammatory change. There are at least diffuse small stones dependent in the gallbladder. Findings are worrisome for acute cholecystitis by CT. Likely 4-5 mm  common duct stone. There could be other noncalcified or less calcified ductal stones as well. Pancreas: Negative Spleen: Normal Adrenals/Urinary Tract: Adrenal glands are normal. Multiple bilateral benign appearing renal cysts. Stomach/Bowel: No primary bowel pathology. Diverticulosis without evidence of diverticulitis. Vascular/Lymphatic: Aortic atherosclerosis, advanced. No aneurysm. IVC is normal. No retroperitoneal adenopathy. Reproductive: Negative Other: No ascites or free air. Musculoskeletal: Extensive chronic degenerative changes of the lumbar spine including 8 mm anterolisthesis L4-5. IMPRESSION: Chest: Underlying emphysema. Underlying bronchiectasis. Scarring in the lower lungs, probably with some atelectasis in the right lower lobe. Extensive coronary artery calcification. Extensive aortic atherosclerosis. Abdomen: Probable acute cholecystitis. The gallbladder is markedly distended and there is surrounding inflammatory change. At least a few small stones dependent in the gallbladder. High suspicion of common duct stone or stones. Advanced aortic atherosclerosis. Diverticulosis without evidence of diverticulitis. Electronically Signed   By: MNelson ChimesM.D.   On: 10/05/2016 09:14   Ct Abdomen Pelvis W Contrast  Result Date: 10/05/2016 CLINICAL DATA:  Nausea, vomiting and right-sided abdominal pain over the last 3 weeks. Hypoxia . EXAM: CT CHEST, ABDOMEN, AND PELVIS WITH CONTRAST TECHNIQUE: Multidetector CT imaging of the chest, abdomen and pelvis was performed following the standard protocol during bolus administration of intravenous contrast. CONTRAST:  831mISOVUE-300 IOPAMIDOL (ISOVUE-300) INJECTION 61% COMPARISON:  04/10/2016 FINDINGS: CT CHEST FINDINGS Cardiovascular: Heart size is normal. No pericardial fluid. Extensive coronary artery calcification. Aortic atherosclerotic calcification. No pulmonary arterial lesion seen, though opacity is not sufficient to rule out emboli. Mediastinum/Nodes:  No mass or lymphadenopathy. Lungs/Pleura: Mild emphysema and scarring in the upper lobes. Linear markings in the lower lobes probably represent a combination of scarring and atelectasis, particularly in the right lower lobe. In general, the bronchi are large consistent with bronchiectasis. No pleural fluid or pleural lesion. Musculoskeletal: No thoracic fracture. Upper thoracic degenerative disc disease. Mid and lower thoracic ankylosis. CT ABDOMEN PELVIS FINDINGS Hepatobiliary: Liver parenchyma shows mild fatty change. The gallbladder is markedly distended and there is surrounding inflammatory change. There are at least diffuse small stones dependent in the gallbladder. Findings are worrisome for acute cholecystitis by CT. Likely 4-5 mm common duct stone. There could be other noncalcified or less calcified ductal stones as well. Pancreas: Negative Spleen: Normal Adrenals/Urinary Tract: Adrenal glands are normal. Multiple bilateral benign appearing renal cysts. Stomach/Bowel: No primary bowel pathology. Diverticulosis without evidence of diverticulitis. Vascular/Lymphatic: Aortic atherosclerosis, advanced. No aneurysm. IVC is normal. No retroperitoneal adenopathy. Reproductive: Negative Other: No ascites or free air. Musculoskeletal: Extensive chronic degenerative changes of the lumbar spine including 8 mm anterolisthesis L4-5. IMPRESSION: Chest: Underlying  emphysema. Underlying bronchiectasis. Scarring in the lower lungs, probably with some atelectasis in the right lower lobe. Extensive coronary artery calcification. Extensive aortic atherosclerosis. Abdomen: Probable acute cholecystitis. The gallbladder is markedly distended and there is surrounding inflammatory change. At least a few small stones dependent in the gallbladder. High suspicion of common duct stone or stones. Advanced aortic atherosclerosis. Diverticulosis without evidence of diverticulitis. Electronically Signed   By: Nelson Chimes M.D.   On:  10/05/2016 09:14   Ir Perc Cholecystostomy  Result Date: 10/06/2016 INDICATION: Acute cholecystitis and need for percutaneous cholecystostomy due to contraindication currently for cholecystectomy. EXAM: CHOLECYSTOSTOMY MEDICATIONS: The patient had received scheduled IV Zosyn approximately 4 hours prior to the procedure. ANESTHESIA/SEDATION: Moderate (conscious) sedation was employed during this procedure. A total of Versed 2.0 mg and Fentanyl 100 mcg was administered intravenously. Moderate Sedation Time: 10 minutes. The patient's level of consciousness and vital signs were monitored continuously by radiology nursing throughout the procedure under my direct supervision. FLUOROSCOPY TIME:  Fluoroscopy Time: 42 seconds. (5.0 mGy). COMPLICATIONS: None immediate. PROCEDURE: Informed written consent was obtained from the patient after a thorough discussion of the procedural risks, benefits and alternatives. All questions were addressed. Maximal Sterile Barrier Technique was utilized including caps, mask, sterile gowns, sterile gloves, sterile drape, hand hygiene and skin antiseptic. A timeout was performed prior to the initiation of the procedure. Ultrasound was used to localize the gallbladder. Under direct ultrasound guidance, a 21 gauge needle was advanced into the gallbladder lumen. Ultrasound image documentation was performed. A bile sample was aspirated. A transitional dilator was advanced over the guidewire. The percutaneous tract was dilated and a 10 French pigtail drainage catheter advanced into the gallbladder lumen. Catheter position was confirmed by fluoroscopy after contrast injection. The catheter was connected to a gravity bag. The catheter was secured at the skin with a Prolene retention suture and StatLock device. FINDINGS: The gallbladder is very distended. After placement of the cholecystostomy tube, there is good drainage of dark colored bile. IMPRESSION: Percutaneous cholecystostomy with  placement of 10 French drainage catheter into the gallbladder lumen. This catheter was connected to gravity bag drainage. Electronically Signed   By: Aletta Edouard M.D.   On: 10/06/2016 16:55    Labs:  CBC:  Recent Labs  09/03/16 1334 10/05/16 0630 10/06/16 0232 10/07/16 0220  WBC 8.7 20.9* 13.3* 8.7  HGB 13.5 13.0 11.9* 11.1*  HCT 41.0 39.2* 37.2* 34.9*  PLT 339.0 293 226 236    COAGS:  Recent Labs  10/06/16 0232  INR 1.43  APTT 36    BMP:  Recent Labs  10/04/16 0942 10/05/16 0630 10/06/16 0232 10/07/16 0220  NA 133* 129* 136 134*  K 4.9 4.5 4.1 4.1  CL 93* 94* 103 101  CO2 32 '26 22 26  ' GLUCOSE 190* 177* 110* 141*  BUN 30* 30* 15 18  CALCIUM 9.5 8.9 8.5* 8.4*  CREATININE 1.76* 1.81* 1.59* 1.64*  GFRNONAA  --  33* 39* 38*  GFRAA  --  39* 45* 44*    LIVER FUNCTION TESTS:  Recent Labs  10/04/16 0942 10/05/16 0630 10/06/16 0232 10/07/16 0220  BILITOT 1.1 1.7* 1.8* 1.1  AST 75* 130* 136* 53*  ALT 63* 131* 181* 111*  ALKPHOS 58 59 75 69  PROT 7.1 7.2 6.1* 6.2*  ALBUMIN 4.7 4.0 2.8* 2.7*    Assessment and Plan:  Percutaneous cholecystostomy drain placed 12/17 Doing well Plan for tr today Plan per [facility] Will need drain 6 weeks or more---unless  goes to surgery  Electronically Signed: Wofford Stratton A 10/07/2016, 8:38 AM   I spent a total of 15 Minutes at the the patient's bedside AND on the patient's hospital floor or unit, greater than 50% of which was counseling/coordinating care for perc chole drain

## 2016-10-07 NOTE — Progress Notes (Signed)
Initial Nutrition Assessment  DOCUMENTATION CODES:   Not applicable  INTERVENTION:  Once diet advances, provide Glucerna Shake po BID, each supplement provides 220 kcal and 10 grams of protein  NUTRITION DIAGNOSIS:   Increased nutrient needs related to acute illness as evidenced by estimated needs.  GOAL:   Patient will meet greater than or equal to 90% of their needs  MONITOR:   Supplement acceptance, Diet advancement, Labs, Weight trends, Skin, I & O's  REASON FOR ASSESSMENT:   Malnutrition Screening Tool    ASSESSMENT:   82 y.o. male with a Past Medical History of  hypertension, DM with neuropathy, PVD, prior CVA (around 2012), basal cell carcinoma, sp recent treatment,  as well as diabetes presents with worsening right upper quadrant abdominal pain; cholecystitis, now s/p perc cholecystostomy with drain placed.  Pt is currently NPO for procedure. Pt reports having a good appetite. Pt reports usually consuming at least 3 meals a day. Pt does report having abdominal discomfort at home thus has been eating nonfat/low fat foods such as eggs, fruits, and chicken. Usual body weight reported to be ~183 lbs. Meal completion prior to NPO status was 75%. Pt is agreeable to nutritional supplements to aid in caloric and protein needs. RD to order.  Nutrition-Focused physical exam completed. Findings are no fat depletion, mild muscle depletion, and no edema.   Labs and medications reviewed.   Diet Order:  Diet NPO time specified Diet NPO time specified  Skin:  Reviewed, no issues  Last BM:  12/14  Height:   Ht Readings from Last 1 Encounters:  10/07/16 5\' 9"  (1.753 m)    Weight:   Wt Readings from Last 1 Encounters:  10/07/16 183 lb (83 kg)    Ideal Body Weight:  72.7 kg  BMI:  Body mass index is 27.02 kg/m.  Estimated Nutritional Needs:   Kcal:  1900-2100  Protein:  90-100 grams  Fluid:  1.9 - 2.1 L/day  EDUCATION NEEDS:   No education needs identified at  this time  Corrin Parker, MS, RD, LDN Pager # [phone] After hours/ weekend pager # [phone]

## 2016-10-07 NOTE — Progress Notes (Signed)
[facility] Progress Note    Since last GI note: IR perc chole drain placed yesterday (dark bile).  He is feeling overall much better.  Has appropriate pain at drain insertion site.  Objective: Vital signs in last 24 hours: Temp:  [97.4 F (36.3 C)-98.6 F (37 C)] 97.9 F (36.6 C) (12/18 0300) Pulse Rate:  [71-104] 71 (12/18 0300) Resp:  [14-27] 15 (12/18 0300) BP: (95-174)/(52-89) 109/60 (12/18 0300) SpO2:  [87 %-99 %] 94 % (12/18 0300) Last BM Date: 10/03/16 General: alert and oriented times 3 Heart: regular rate and rythm Abdomen: soft, non-tender, non-distended, normal bowel sounds JP drain: dark bile, grunge, small amount of blood  Lab Results:  Recent Labs  10/05/16 0630 10/06/16 0232 10/07/16 0220  WBC 20.9* 13.3* 8.7  HGB 13.0 11.9* 11.1*  PLT 293 226 236  MCV 85.5 86.9 86.6    Recent Labs  10/05/16 0630 10/06/16 0232 10/07/16 0220  NA 129* 136 134*  K 4.5 4.1 4.1  CL 94* 103 101  CO2 26 22 26   GLUCOSE 177* 110* 141*  BUN 30* 15 18  CREATININE 1.81* 1.59* 1.64*  CALCIUM 8.9 8.5* 8.4*    Recent Labs  10/05/16 0630 10/06/16 0232 10/07/16 0220  PROT 7.2 6.1* 6.2*  ALBUMIN 4.0 2.8* 2.7*  AST 130* 136* 53*  ALT 131* 181* 111*  ALKPHOS 59 75 69  BILITOT 1.7* 1.8* 1.1    Recent Labs  10/06/16 0232  INR 1.43     Studies/Results: X-ray Chest Pa And Lateral  Result Date: 10/06/2016 CLINICAL DATA:  Cholecystitis. EXAM: CHEST  2 VIEW COMPARISON:  10/04/2016 FINDINGS: Right basilar airspace opacity noted, anteriorly on the lateral view compatible with right middle lobe atelectasis or pneumonia. Minimal left base atelectasis or scarring. No effusions. Heart is normal size. IMPRESSION: Right middle lobe atelectasis or pneumonia. Minimal left base atelectasis or scarring. Electronically Signed   By: Rolm Baptise M.D.   On: 10/06/2016 10:49   Ct Chest W Contrast  Result Date: 10/05/2016 CLINICAL DATA:  Nausea, vomiting and  right-sided abdominal pain over the last 3 weeks. Hypoxia . EXAM: CT CHEST, ABDOMEN, AND PELVIS WITH CONTRAST TECHNIQUE: Multidetector CT imaging of the chest, abdomen and pelvis was performed following the standard protocol during bolus administration of intravenous contrast. CONTRAST:  95mL ISOVUE-300 IOPAMIDOL (ISOVUE-300) INJECTION 61% COMPARISON:  04/10/2016 FINDINGS: CT CHEST FINDINGS Cardiovascular: Heart size is normal. No pericardial fluid. Extensive coronary artery calcification. Aortic atherosclerotic calcification. No pulmonary arterial lesion seen, though opacity is not sufficient to rule out emboli. Mediastinum/Nodes: No mass or lymphadenopathy. Lungs/Pleura: Mild emphysema and scarring in the upper lobes. Linear markings in the lower lobes probably represent a combination of scarring and atelectasis, particularly in the right lower lobe. In general, the bronchi are large consistent with bronchiectasis. No pleural fluid or pleural lesion. Musculoskeletal: No thoracic fracture. Upper thoracic degenerative disc disease. Mid and lower thoracic ankylosis. CT ABDOMEN PELVIS FINDINGS Hepatobiliary: Liver parenchyma shows mild fatty change. The gallbladder is markedly distended and there is surrounding inflammatory change. There are at least diffuse small stones dependent in the gallbladder. Findings are worrisome for acute cholecystitis by CT. Likely 4-5 mm common duct stone. There could be other noncalcified or less calcified ductal stones as well. Pancreas: Negative Spleen: Normal Adrenals/Urinary Tract: Adrenal glands are normal. Multiple bilateral benign appearing renal cysts. Stomach/Bowel: No primary bowel pathology. Diverticulosis without evidence of diverticulitis. Vascular/Lymphatic: Aortic atherosclerosis, advanced. No aneurysm. IVC is normal. No retroperitoneal adenopathy. Reproductive: Negative  Other: No ascites or free air. Musculoskeletal: Extensive chronic degenerative changes of the lumbar  spine including 8 mm anterolisthesis L4-5. IMPRESSION: Chest: Underlying emphysema. Underlying bronchiectasis. Scarring in the lower lungs, probably with some atelectasis in the right lower lobe. Extensive coronary artery calcification. Extensive aortic atherosclerosis. Abdomen: Probable acute cholecystitis. The gallbladder is markedly distended and there is surrounding inflammatory change. At least a few small stones dependent in the gallbladder. High suspicion of common duct stone or stones. Advanced aortic atherosclerosis. Diverticulosis without evidence of diverticulitis. Electronically Signed   By: Nelson Chimes M.D.   On: 10/05/2016 09:14   Ct Abdomen Pelvis W Contrast  Result Date: 10/05/2016 CLINICAL DATA:  Nausea, vomiting and right-sided abdominal pain over the last 3 weeks. Hypoxia . EXAM: CT CHEST, ABDOMEN, AND PELVIS WITH CONTRAST TECHNIQUE: Multidetector CT imaging of the chest, abdomen and pelvis was performed following the standard protocol during bolus administration of intravenous contrast. CONTRAST:  86mL ISOVUE-300 IOPAMIDOL (ISOVUE-300) INJECTION 61% COMPARISON:  04/10/2016 FINDINGS: CT CHEST FINDINGS Cardiovascular: Heart size is normal. No pericardial fluid. Extensive coronary artery calcification. Aortic atherosclerotic calcification. No pulmonary arterial lesion seen, though opacity is not sufficient to rule out emboli. Mediastinum/Nodes: No mass or lymphadenopathy. Lungs/Pleura: Mild emphysema and scarring in the upper lobes. Linear markings in the lower lobes probably represent a combination of scarring and atelectasis, particularly in the right lower lobe. In general, the bronchi are large consistent with bronchiectasis. No pleural fluid or pleural lesion. Musculoskeletal: No thoracic fracture. Upper thoracic degenerative disc disease. Mid and lower thoracic ankylosis. CT ABDOMEN PELVIS FINDINGS Hepatobiliary: Liver parenchyma shows mild fatty change. The gallbladder is markedly  distended and there is surrounding inflammatory change. There are at least diffuse small stones dependent in the gallbladder. Findings are worrisome for acute cholecystitis by CT. Likely 4-5 mm common duct stone. There could be other noncalcified or less calcified ductal stones as well. Pancreas: Negative Spleen: Normal Adrenals/Urinary Tract: Adrenal glands are normal. Multiple bilateral benign appearing renal cysts. Stomach/Bowel: No primary bowel pathology. Diverticulosis without evidence of diverticulitis. Vascular/Lymphatic: Aortic atherosclerosis, advanced. No aneurysm. IVC is normal. No retroperitoneal adenopathy. Reproductive: Negative Other: No ascites or free air. Musculoskeletal: Extensive chronic degenerative changes of the lumbar spine including 8 mm anterolisthesis L4-5. IMPRESSION: Chest: Underlying emphysema. Underlying bronchiectasis. Scarring in the lower lungs, probably with some atelectasis in the right lower lobe. Extensive coronary artery calcification. Extensive aortic atherosclerosis. Abdomen: Probable acute cholecystitis. The gallbladder is markedly distended and there is surrounding inflammatory change. At least a few small stones dependent in the gallbladder. High suspicion of common duct stone or stones. Advanced aortic atherosclerosis. Diverticulosis without evidence of diverticulitis. Electronically Signed   By: Nelson Chimes M.D.   On: 10/05/2016 09:14   Ir Perc Cholecystostomy  Result Date: 10/06/2016 INDICATION: Acute cholecystitis and need for percutaneous cholecystostomy due to contraindication currently for cholecystectomy. EXAM: CHOLECYSTOSTOMY MEDICATIONS: The patient had received scheduled IV Zosyn approximately 4 hours prior to the procedure. ANESTHESIA/SEDATION: Moderate (conscious) sedation was employed during this procedure. A total of Versed 2.0 mg and Fentanyl 100 mcg was administered intravenously. Moderate Sedation Time: 10 minutes. The patient's level of  consciousness and vital signs were monitored continuously by radiology nursing throughout the procedure under my direct supervision. FLUOROSCOPY TIME:  Fluoroscopy Time: 42 seconds. (5.0 mGy). COMPLICATIONS: None immediate. PROCEDURE: Informed written consent was obtained from the patient after a thorough discussion of the procedural risks, benefits and alternatives. All questions were addressed. Maximal Sterile Barrier Technique was utilized  including caps, mask, sterile gowns, sterile gloves, sterile drape, hand hygiene and skin antiseptic. A timeout was performed prior to the initiation of the procedure. Ultrasound was used to localize the gallbladder. Under direct ultrasound guidance, a 21 gauge needle was advanced into the gallbladder lumen. Ultrasound image documentation was performed. A bile sample was aspirated. A transitional dilator was advanced over the guidewire. The percutaneous tract was dilated and a 10 French pigtail drainage catheter advanced into the gallbladder lumen. Catheter position was confirmed by fluoroscopy after contrast injection. The catheter was connected to a gravity bag. The catheter was secured at the skin with a Prolene retention suture and StatLock device. FINDINGS: The gallbladder is very distended. After placement of the cholecystostomy tube, there is good drainage of dark colored bile. IMPRESSION: Percutaneous cholecystostomy with placement of 10 French drainage catheter into the gallbladder lumen. This catheter was connected to gravity bag drainage. Electronically Signed   By: Aletta Edouard M.D.   On: 10/06/2016 16:55     Medications: Scheduled Meds: . aspirin EC  81 mg Oral Daily  . cilostazol  50 mg Oral BID  . finasteride  5 mg Oral Daily  . gabapentin  100 mg Oral TID  . insulin aspart  0-9 Units Subcutaneous TID WC  . latanoprost  1 drop Both Eyes QHS  . metoprolol succinate  100 mg Oral Daily  . pantoprazole  40 mg Oral BID AC  . piperacillin-tazobactam  (ZOSYN)  IV  3.375 g Intravenous Q8H  . sodium chloride flush  3 mL Intravenous Q12H  . tamsulosin  0.4 mg Oral QPC breakfast   Continuous Infusions: . sodium chloride 50 mL/hr at 10/06/16 0430   PRN Meds:.acetaminophen **OR** acetaminophen, bisacodyl, cyclobenzaprine, hydrALAZINE, magic mouthwash, magnesium citrate, morphine injection, ondansetron **OR** ondansetron (ZOFRAN) IV, senna-docusate, traMADol, traZODone    Assessment/Plan: 82 y.o. male acute cholecystitis and possible CBD stone   It seems that acute cholecystitis was the primary driver to this acute illness; his liver tests continue improve, WBC is normal after Iv Abx and perc chole drain yesterday.  He feels overall much better as well.   I reviewed the CT scan and there is a small hyperechoic foci associated with the CBD. I am going to order MRCP today to confirm if stone is truly present and if so then we'll plan on ERCP (probably this admission if he continues to recover after the perc chole drain).  He can eat today.      Milus Banister, MD  10/07/2016, 7:30 AM [facility] Pager [phone]

## 2016-10-07 NOTE — Progress Notes (Signed)
Patient seen by Dr. Derrel Nip, Having chest pain Known CAD, PAD Needs stress test Recommend lexiscan Myoview, follow up in clinic after test

## 2016-10-07 NOTE — Progress Notes (Signed)
PROGRESS NOTE  Henry Lindsey [id number] DOB: [birth year] DOA: 10/05/2016 PCP: [name], MD  Brief History:  82 y.o.malewith hypertension, hyperlipidemia, PVD, prior CVA (around 2012), basal cell carcinoma, and DM who presented to [facility] with worsening right upper quadrant abdominal pain. He had been having some nausea and vomiting and discomfort of the abdomen since Thanksgiving, but it progressively worsened. He was found to have acute cholecystitis w/ choledocholithiasis w/ cbd stone. Transferred to our facility for eval for possible ERCP  Assessment/Plan: Acute Cholecystitis with possible choledocholithiasis -appreciate general surgery-->IR cholecystotomy -12/17--IR cholecystotomy--follow bile cultures-->LFTs trending down -appreciate GI-->MRCP -10/05/16 CT abd--cholelithiasis with inflammation with possible choledocholithiaisis -continue zosyn  Acute on chronic renal failure--CKD3 -baseline creatinine 1.2-1.5 -secondary to sepsis -serum creatinine peaked 1.81  Acute respiratory failure with hypoxia -stable on 3 L [place] -secondary to atelectasis from hypoventilation and bronchiectasis  IDDM -10/05/16 A1C--7.6 -controlled  HTN -hold Hyzaar due to soft BP -reduce dose metoprolol succinate due to soft BP  CAD -Has refused prior cardiac eval therefore current quantification not possible  Squamous cell carcinoma of skin/nose Stopped oral chemo agent ~Thanksgiving due to GI symptoms which may have actually be related to gallbladder disease     Disposition Plan:   Not stable for d/c Family Communication:   Wife update at bedside 12/18--Total time spent 35 minutes.  Greater than 50% spent face to face counseling and coordinating care.  Consultants:  [facility], [facility]  Code Status:  FULL   DVT Prophylaxis:  SCDs   Procedures: As Listed in Progress Note Above  Antibiotics: Zosyn 12/16>>>    Subjective: Patient denies fevers, chills,  headache, chest pain, dyspnea, nausea, vomiting, diarrhea, abdominal pain, dysuria, hematuria, hematochezia, and melena.   Objective: Vitals:   10/06/16 1800 10/06/16 2125 10/07/16 0048 10/07/16 0300  BP: (!) 112/52 (!) 106/54 (!) 101/53 109/60  Pulse: 77 72 72 71  Resp: 15 20 19 15   Temp:  98.6 F (37 C) 97.4 F (36.3 C) 97.9 F (36.6 C)  TempSrc:  Oral Oral Oral  SpO2: 98% 97% 96% 94%  Weight:      Height:        Intake/Output Summary (Last 24 hours) at 10/07/16 0737 Last data filed at 10/07/16 0450  Gross per 24 hour  Intake             2100 ml  Output             4150 ml  Net            -2050 ml   Weight change:  Exam:   General:  Pt is alert, follows commands appropriately, not in acute distress  HEENT: No icterus, No thrush, No neck mass, Gleason/AT  Cardiovascular: RRR, S1/S2, no rubs, no gallops  Respiratory: bibasilar crackles, no wheeze  Abdomen: Soft/+BS, non tender, non distended, no guarding  Extremities: No edema, No lymphangitis, No petechiae, No rashes, no synovitis   Data Reviewed: I have personally reviewed following labs and imaging studies Basic Metabolic Panel:  Recent Labs Lab 10/04/16 0942 10/05/16 0630 10/06/16 0232 10/07/16 0220  NA 133* 129* 136 134*  K 4.9 4.5 4.1 4.1  CL 93* 94* 103 101  CO2 32 26 22 26   GLUCOSE 190* 177* 110* 141*  BUN 30* 30* 15 18  CREATININE 1.76* 1.81* 1.59* 1.64*  CALCIUM 9.5 8.9 8.5* 8.4*   Liver Function Tests:  Recent Labs Lab 10/04/16 [phone]  10/05/16 0630 10/06/16 0232 10/07/16 0220  AST 75* 130* 136* 53*  ALT 63* 131* 181* 111*  ALKPHOS 58 59 75 69  BILITOT 1.1 1.7* 1.8* 1.1  PROT 7.1 7.2 6.1* 6.2*  ALBUMIN 4.7 4.0 2.8* 2.7*    Recent Labs Lab 10/05/16 0630  LIPASE <10*   No results for input(s): AMMONIA in the last 168 hours. Coagulation Profile:  Recent Labs Lab 10/06/16 0232  INR 1.43   CBC:  Recent Labs Lab 10/05/16 0630 10/06/16 0232 10/07/16 0220  WBC 20.9* 13.3*  8.7  HGB 13.0 11.9* 11.1*  HCT 39.2* 37.2* 34.9*  MCV 85.5 86.9 86.6  PLT 293 226 236   Cardiac Enzymes:  Recent Labs Lab 10/04/16 1052  CKTOTAL 153  CKMB 5.1*   BNP: Invalid input(s): POCBNP CBG:  Recent Labs Lab 10/05/16 2054 10/06/16 0817 10/06/16 1211 10/06/16 1637 10/06/16 2129  GLUCAP 109* 110* 137* 217* 165*   HbA1C:  Recent Labs  10/04/16 0942 10/05/16 1440  HGBA1C 7.9* 7.6*   Urine analysis:    Component Value Date/Time   COLORURINE YELLOW 10/05/2016 1531   APPEARANCEUR CLEAR 10/05/2016 1531   APPEARANCEUR Clear 08/17/2012 1505   LABSPEC 1.012 10/05/2016 1531   LABSPEC 1.017 08/17/2012 1505   PHURINE 7.0 10/05/2016 1531   GLUCOSEU 150 (A) 10/05/2016 1531   GLUCOSEU 50 mg/dL 08/17/2012 1505   HGBUR SMALL (A) 10/05/2016 1531   BILIRUBINUR NEGATIVE 10/05/2016 1531   BILIRUBINUR neg 10/07/2014 1444   BILIRUBINUR Negative 08/17/2012 1505   KETONESUR NEGATIVE 10/05/2016 1531   PROTEINUR NEGATIVE 10/05/2016 1531   UROBILINOGEN 0.2 10/07/2014 1444   NITRITE NEGATIVE 10/05/2016 1531   LEUKOCYTESUR NEGATIVE 10/05/2016 1531   LEUKOCYTESUR Negative 08/17/2012 1505   Sepsis Labs: @LABRCNTIP (procalcitonin:4,lacticidven:4) ) Recent Results (from the past 240 hour(s))  Blood culture (routine x 2)     Status: None (Preliminary result)   Collection Time: 10/05/16  7:13 AM  Result Value Ref Range Status   Specimen Description BLOOD  RT FOREAM  Final   Special Requests BOTTLES DRAWN AEROBIC AND ANAEROBIC 6CC  Final   Culture NO GROWTH 2 DAYS  Final   Report Status PENDING  Incomplete  Blood culture (routine x 2)     Status: None (Preliminary result)   Collection Time: 10/05/16  7:13 AM  Result Value Ref Range Status   Specimen Description BLOOD LEFT IV  Final   Special Requests BOTTLES DRAWN AEROBIC AND ANAEROBIC [place]  Final   Culture NO GROWTH 2 DAYS  Final   Report Status PENDING  Incomplete  Urine culture     Status: None   Collection Time: 10/05/16   7:13 AM  Result Value Ref Range Status   Specimen Description URINE, RANDOM  Final   Special Requests NONE  Final   Culture NO GROWTH Performed at [facility]   Final   Report Status 10/06/2016 FINAL  Final  Surgical PCR screen     Status: None   Collection Time: 10/05/16 12:29 PM  Result Value Ref Range Status   MRSA, PCR NEGATIVE NEGATIVE Final   Staphylococcus aureus NEGATIVE NEGATIVE Final    Comment:        The Xpert SA Assay (FDA approved for NASAL specimens in patients over 31 years of age), is one component of a comprehensive surveillance program.  Test performance has been validated by [facility] for patients greater than or equal to 56 year old. It is not intended to diagnose infection nor to guide  or monitor treatment.   Gram stain     Status: None   Collection Time: 10/06/16 11:07 AM  Result Value Ref Range Status   Specimen Description GALL BLADDER  Final   Special Requests NONE  Final   Gram Stain NO WBC SEEN NO ORGANISMS SEEN   Final   Report Status 10/06/2016 FINAL  Final     Scheduled Meds: . aspirin EC  81 mg Oral Daily  . cilostazol  50 mg Oral BID  . finasteride  5 mg Oral Daily  . gabapentin  100 mg Oral TID  . insulin aspart  0-9 Units Subcutaneous TID WC  . latanoprost  1 drop Both Eyes QHS  . metoprolol succinate  100 mg Oral Daily  . pantoprazole  40 mg Oral BID AC  . piperacillin-tazobactam (ZOSYN)  IV  3.375 g Intravenous Q8H  . sodium chloride flush  3 mL Intravenous Q12H  . tamsulosin  0.4 mg Oral QPC breakfast   Continuous Infusions: . sodium chloride 50 mL/hr at 10/06/16 0430    Procedures/Studies: X-ray Chest Pa And Lateral  Result Date: 10/06/2016 CLINICAL DATA:  Cholecystitis. EXAM: CHEST  2 VIEW COMPARISON:  10/04/2016 FINDINGS: Right basilar airspace opacity noted, anteriorly on the lateral view compatible with right middle lobe atelectasis or pneumonia. Minimal left base atelectasis or scarring. No effusions.  Heart is normal size. IMPRESSION: Right middle lobe atelectasis or pneumonia. Minimal left base atelectasis or scarring. Electronically Signed   By: Rolm Baptise M.D.   On: 10/06/2016 10:49   Dg Chest 2 View  Result Date: 10/04/2016 CLINICAL DATA:  Hypoxia, chest pain at night. EXAM: CHEST  2 VIEW COMPARISON:  08/07/2015 FINDINGS: Cardiomediastinal silhouette is normal. Mediastinal contours appear intact. Calcific atherosclerotic disease and tortuosity of the aorta are noted. There is no evidence of focal airspace consolidation, pleural effusion or pneumothorax. Osseous structures are without acute abnormality. Ankylosing changes of the thoracic spine are noted. Soft tissues are grossly normal. IMPRESSION: No active cardiopulmonary disease. Calcific atherosclerotic disease of the aorta. Electronically Signed   By: Fidela Salisbury M.D.   On: 10/04/2016 14:49   Ct Chest W Contrast  Result Date: 10/05/2016 CLINICAL DATA:  Nausea, vomiting and right-sided abdominal pain over the last 3 weeks. Hypoxia . EXAM: CT CHEST, ABDOMEN, AND PELVIS WITH CONTRAST TECHNIQUE: Multidetector CT imaging of the chest, abdomen and pelvis was performed following the standard protocol during bolus administration of intravenous contrast. CONTRAST:  51mL ISOVUE-300 IOPAMIDOL (ISOVUE-300) INJECTION 61% COMPARISON:  04/10/2016 FINDINGS: CT CHEST FINDINGS Cardiovascular: Heart size is normal. No pericardial fluid. Extensive coronary artery calcification. Aortic atherosclerotic calcification. No pulmonary arterial lesion seen, though opacity is not sufficient to rule out emboli. Mediastinum/Nodes: No mass or lymphadenopathy. Lungs/Pleura: Mild emphysema and scarring in the upper lobes. Linear markings in the lower lobes probably represent a combination of scarring and atelectasis, particularly in the right lower lobe. In general, the bronchi are large consistent with bronchiectasis. No pleural fluid or pleural lesion.  Musculoskeletal: No thoracic fracture. Upper thoracic degenerative disc disease. Mid and lower thoracic ankylosis. CT ABDOMEN PELVIS FINDINGS Hepatobiliary: Liver parenchyma shows mild fatty change. The gallbladder is markedly distended and there is surrounding inflammatory change. There are at least diffuse small stones dependent in the gallbladder. Findings are worrisome for acute cholecystitis by CT. Likely 4-5 mm common duct stone. There could be other noncalcified or less calcified ductal stones as well. Pancreas: Negative Spleen: Normal Adrenals/Urinary Tract: Adrenal glands are normal. Multiple bilateral benign  appearing renal cysts. Stomach/Bowel: No primary bowel pathology. Diverticulosis without evidence of diverticulitis. Vascular/Lymphatic: Aortic atherosclerosis, advanced. No aneurysm. IVC is normal. No retroperitoneal adenopathy. Reproductive: Negative Other: No ascites or free air. Musculoskeletal: Extensive chronic degenerative changes of the lumbar spine including 8 mm anterolisthesis L4-5. IMPRESSION: Chest: Underlying emphysema. Underlying bronchiectasis. Scarring in the lower lungs, probably with some atelectasis in the right lower lobe. Extensive coronary artery calcification. Extensive aortic atherosclerosis. Abdomen: Probable acute cholecystitis. The gallbladder is markedly distended and there is surrounding inflammatory change. At least a few small stones dependent in the gallbladder. High suspicion of common duct stone or stones. Advanced aortic atherosclerosis. Diverticulosis without evidence of diverticulitis. Electronically Signed   By: Nelson Chimes M.D.   On: 10/05/2016 09:14   Ct Abdomen Pelvis W Contrast  Result Date: 10/05/2016 CLINICAL DATA:  Nausea, vomiting and right-sided abdominal pain over the last 3 weeks. Hypoxia . EXAM: CT CHEST, ABDOMEN, AND PELVIS WITH CONTRAST TECHNIQUE: Multidetector CT imaging of the chest, abdomen and pelvis was performed following the standard  protocol during bolus administration of intravenous contrast. CONTRAST:  76mL ISOVUE-300 IOPAMIDOL (ISOVUE-300) INJECTION 61% COMPARISON:  04/10/2016 FINDINGS: CT CHEST FINDINGS Cardiovascular: Heart size is normal. No pericardial fluid. Extensive coronary artery calcification. Aortic atherosclerotic calcification. No pulmonary arterial lesion seen, though opacity is not sufficient to rule out emboli. Mediastinum/Nodes: No mass or lymphadenopathy. Lungs/Pleura: Mild emphysema and scarring in the upper lobes. Linear markings in the lower lobes probably represent a combination of scarring and atelectasis, particularly in the right lower lobe. In general, the bronchi are large consistent with bronchiectasis. No pleural fluid or pleural lesion. Musculoskeletal: No thoracic fracture. Upper thoracic degenerative disc disease. Mid and lower thoracic ankylosis. CT ABDOMEN PELVIS FINDINGS Hepatobiliary: Liver parenchyma shows mild fatty change. The gallbladder is markedly distended and there is surrounding inflammatory change. There are at least diffuse small stones dependent in the gallbladder. Findings are worrisome for acute cholecystitis by CT. Likely 4-5 mm common duct stone. There could be other noncalcified or less calcified ductal stones as well. Pancreas: Negative Spleen: Normal Adrenals/Urinary Tract: Adrenal glands are normal. Multiple bilateral benign appearing renal cysts. Stomach/Bowel: No primary bowel pathology. Diverticulosis without evidence of diverticulitis. Vascular/Lymphatic: Aortic atherosclerosis, advanced. No aneurysm. IVC is normal. No retroperitoneal adenopathy. Reproductive: Negative Other: No ascites or free air. Musculoskeletal: Extensive chronic degenerative changes of the lumbar spine including 8 mm anterolisthesis L4-5. IMPRESSION: Chest: Underlying emphysema. Underlying bronchiectasis. Scarring in the lower lungs, probably with some atelectasis in the right lower lobe. Extensive coronary  artery calcification. Extensive aortic atherosclerosis. Abdomen: Probable acute cholecystitis. The gallbladder is markedly distended and there is surrounding inflammatory change. At least a few small stones dependent in the gallbladder. High suspicion of common duct stone or stones. Advanced aortic atherosclerosis. Diverticulosis without evidence of diverticulitis. Electronically Signed   By: Nelson Chimes M.D.   On: 10/05/2016 09:14   Ir Perc Cholecystostomy  Result Date: 10/06/2016 INDICATION: Acute cholecystitis and need for percutaneous cholecystostomy due to contraindication currently for cholecystectomy. EXAM: CHOLECYSTOSTOMY MEDICATIONS: The patient had received scheduled IV Zosyn approximately 4 hours prior to the procedure. ANESTHESIA/SEDATION: Moderate (conscious) sedation was employed during this procedure. A total of Versed 2.0 mg and Fentanyl 100 mcg was administered intravenously. Moderate Sedation Time: 10 minutes. The patient's level of consciousness and vital signs were monitored continuously by radiology nursing throughout the procedure under my direct supervision. FLUOROSCOPY TIME:  Fluoroscopy Time: 42 seconds. (5.0 mGy). COMPLICATIONS: None immediate. PROCEDURE: Informed written consent  was obtained from the patient after a thorough discussion of the procedural risks, benefits and alternatives. All questions were addressed. Maximal Sterile Barrier Technique was utilized including caps, mask, sterile gowns, sterile gloves, sterile drape, hand hygiene and skin antiseptic. A timeout was performed prior to the initiation of the procedure. Ultrasound was used to localize the gallbladder. Under direct ultrasound guidance, a 21 gauge needle was advanced into the gallbladder lumen. Ultrasound image documentation was performed. A bile sample was aspirated. A transitional dilator was advanced over the guidewire. The percutaneous tract was dilated and a 10 French pigtail drainage catheter advanced into  the gallbladder lumen. Catheter position was confirmed by fluoroscopy after contrast injection. The catheter was connected to a gravity bag. The catheter was secured at the skin with a Prolene retention suture and StatLock device. FINDINGS: The gallbladder is very distended. After placement of the cholecystostomy tube, there is good drainage of dark colored bile. IMPRESSION: Percutaneous cholecystostomy with placement of 10 French drainage catheter into the gallbladder lumen. This catheter was connected to gravity bag drainage. Electronically Signed   By: Aletta Edouard M.D.   On: 10/06/2016 16:55    Colin Ellers, DO  [facility] Pager [phone]  If 7PM-7AM, please contact night-coverage [email or web address] Password [email or web address] 10/07/2016, 7:37 AM   LOS: 2 days

## 2016-10-08 ENCOUNTER — Telehealth: Payer: Self-pay | Admitting: *Deleted

## 2016-10-08 DIAGNOSIS — E784 Other hyperlipidemia: Secondary | ICD-10-CM

## 2016-10-08 LAB — COMPREHENSIVE METABOLIC PANEL
ALT: 78 U/L — ABNORMAL HIGH (ref 17–63)
AST: 30 U/L (ref 15–41)
Albumin: 3 g/dL — ABNORMAL LOW (ref 3.5–5.0)
Alkaline Phosphatase: 66 U/L (ref 38–126)
Anion gap: 10 (ref 5–15)
BUN: 17 mg/dL (ref 6–20)
CHLORIDE: 101 mmol/L (ref 101–111)
CO2: 24 mmol/L (ref 22–32)
Calcium: 8.8 mg/dL — ABNORMAL LOW (ref 8.9–10.3)
Creatinine, Ser: 1.35 mg/dL — ABNORMAL HIGH (ref 0.61–1.24)
GFR, EST AFRICAN AMERICAN: 55 mL/min — AB (ref >60)
GFR, EST NON AFRICAN AMERICAN: 48 mL/min — AB (ref >60)
Glucose, Bld: 180 mg/dL — ABNORMAL HIGH (ref 65–99)
POTASSIUM: 3.9 mmol/L (ref 3.5–5.1)
SODIUM: 135 mmol/L (ref 135–145)
Total Bilirubin: 0.8 mg/dL (ref 0.3–1.2)
Total Protein: 6.7 g/dL (ref 6.5–8.1)

## 2016-10-08 LAB — CBC
HCT: 37.5 % — ABNORMAL LOW (ref 39.0–52.0)
Hemoglobin: 12.4 g/dL — ABNORMAL LOW (ref 13.0–17.0)
MCH: 27.9 pg (ref 26.0–34.0)
MCHC: 33.1 g/dL (ref 30.0–36.0)
MCV: 84.3 fL (ref 78.0–100.0)
PLATELETS: 260 10*3/uL (ref 150–400)
RBC: 4.45 MIL/uL (ref 4.22–5.81)
RDW: 13.5 % (ref 11.5–15.5)
WBC: 5.5 10*3/uL (ref 4.0–10.5)

## 2016-10-08 LAB — PROTIME-INR
INR: 1.05
PROTHROMBIN TIME: 13.8 s (ref 11.4–15.2)

## 2016-10-08 LAB — GLUCOSE, CAPILLARY
GLUCOSE-CAPILLARY: 181 mg/dL — AB (ref 65–99)
Glucose-Capillary: 186 mg/dL — ABNORMAL HIGH (ref 65–99)

## 2016-10-08 MED ORDER — CIPROFLOXACIN HCL 500 MG PO TABS: 500.0000 mg | ORAL_TABLET | Freq: Two times a day (BID) | ORAL | 0 refills | Status: DC

## 2016-10-08 MED ORDER — METRONIDAZOLE 500 MG PO TABS: 500.0000 mg | ORAL_TABLET | Freq: Three times a day (TID) | ORAL | Status: DC

## 2016-10-08 MED ORDER — WHITE PETROLATUM GEL
Status: AC
  Administered 2016-10-08: 06:00:00
  Filled 2016-10-08: qty 1

## 2016-10-08 MED ORDER — GADOBENATE DIMEGLUMINE 529 MG/ML IV SOLN
20.0000 mL | Freq: Once | INTRAVENOUS | Status: AC
  Administered 2016-10-07: 20 mL via INTRAVENOUS

## 2016-10-08 MED ORDER — CIPROFLOXACIN HCL 500 MG PO TABS: 500.0000 mg | ORAL_TABLET | Freq: Two times a day (BID) | ORAL | Status: DC

## 2016-10-08 MED ORDER — METRONIDAZOLE 500 MG PO TABS: 500.0000 mg | ORAL_TABLET | Freq: Three times a day (TID) | ORAL | 0 refills | Status: DC

## 2016-10-08 NOTE — Progress Notes (Signed)
Daily Rounding Note  10/08/2016, 9:27 AM  LOS: 3 days   SUBJECTIVE:   Chief complaint: feeling poorly.  Overall he does feel better. The upper abdominal pain is much improved and now is only low level. Tolerating full liquids though has been nothing by mouth this morning in case he needed to procedures today.   Biliary drainage 1350 >> 930 ml previous 2 days. Wondering when he might be able to go home.  OBJECTIVE:         Vital signs in last 24 hours:    Temp:  [97.7 F (36.5 C)-98.4 F (36.9 C)] 97.9 F (36.6 C) (12/19 0457) Pulse Rate:  [77-91] 91 (12/19 0457) Resp:  [16-19] 19 (12/19 0457) BP: (118-144)/(57-67) 144/67 (12/19 0457) SpO2:  [94 %-96 %] 94 % (12/19 0457) Weight:  [83 kg (183 lb)] 83 kg (183 lb) (12/18 1034) Last BM Date: 10/06/16 Filed Weights   10/05/16 1322 10/07/16 1034  Weight: 81.6 kg (180 lb) 83 kg (183 lb)   General: alert, comfortable   Heart: NSR in 80s on tele Chest: Clear bilaterally. Abdomen: Soft. Active bowel sounds. Not distended. Tender over right upper abdomen. Percutaneous drainage is deep brown, clear.  Extremities: No CCE. Neuro/Psych:  Cooperative, alert. Fully oriented. No gross deficits.  Intake/Output from previous day: 12/18 0701 - 12/19 0700 In: 870 [P.O.:720; IV Piggyback:150] Out: 2830 [Urine:1900; Drains:930]  Intake/Output this shift: No intake/output data recorded.  Lab Results:  Recent Labs  10/06/16 0232 10/07/16 0220 10/08/16 0752  WBC 13.3* 8.7 5.5  HGB 11.9* 11.1* 12.4*  HCT 37.2* 34.9* 37.5*  PLT 226 236 260   BMET  Recent Labs  10/06/16 0232 10/07/16 0220 10/08/16 0752  NA 136 134* 135  K 4.1 4.1 3.9  CL 103 101 101  CO2 22 26 24   GLUCOSE 110* 141* 180*  BUN 15 18 17   CREATININE 1.59* 1.64* 1.35*  CALCIUM 8.5* 8.4* 8.8*   LFT  Recent Labs  10/06/16 0232 10/07/16 0220 10/08/16 0752  PROT 6.1* 6.2* 6.7  ALBUMIN 2.8* 2.7* 3.0*    AST 136* 53* 30  ALT 181* 111* 78*  ALKPHOS 75 69 66  BILITOT 1.8* 1.1 0.8   PT/INR  Recent Labs  10/06/16 0232 10/08/16 0752  LABPROT 17.5* 13.8  INR 1.43 1.05   Hepatitis Panel No results for input(s): HEPBSAG, HCVAB, HEPAIGM, HEPBIGM in the last 72 hours.  Studies/Results: X-ray Chest Pa And Lateral  Result Date: 10/06/2016 CLINICAL DATA:  Cholecystitis. EXAM: CHEST  2 VIEW COMPARISON:  10/04/2016 FINDINGS: Right basilar airspace opacity noted, anteriorly on the lateral view compatible with right middle lobe atelectasis or pneumonia. Minimal left base atelectasis or scarring. No effusions. Heart is normal size. IMPRESSION: Right middle lobe atelectasis or pneumonia. Minimal left base atelectasis or scarring. Electronically Signed   By: Rolm Baptise M.D.   On: 10/06/2016 10:49   Ir Perc Cholecystostomy  Result Date: 10/06/2016 INDICATION: Acute cholecystitis and need for percutaneous cholecystostomy due to contraindication currently for cholecystectomy. EXAM: CHOLECYSTOSTOMY MEDICATIONS: The patient had received scheduled IV Zosyn approximately 4 hours prior to the procedure. ANESTHESIA/SEDATION: Moderate (conscious) sedation was employed during this procedure. A total of Versed 2.0 mg and Fentanyl 100 mcg was administered intravenously. Moderate Sedation Time: 10 minutes. The patient's level of consciousness and vital signs were monitored continuously by radiology nursing throughout the procedure under my direct supervision. FLUOROSCOPY TIME:  Fluoroscopy Time: 42 seconds. (5.0 mGy). COMPLICATIONS:  None immediate. PROCEDURE: Informed written consent was obtained from the patient after a thorough discussion of the procedural risks, benefits and alternatives. All questions were addressed. Maximal Sterile Barrier Technique was utilized including caps, mask, sterile gowns, sterile gloves, sterile drape, hand hygiene and skin antiseptic. A timeout was performed prior to the initiation of  the procedure. Ultrasound was used to localize the gallbladder. Under direct ultrasound guidance, a 21 gauge needle was advanced into the gallbladder lumen. Ultrasound image documentation was performed. A bile sample was aspirated. A transitional dilator was advanced over the guidewire. The percutaneous tract was dilated and a 10 French pigtail drainage catheter advanced into the gallbladder lumen. Catheter position was confirmed by fluoroscopy after contrast injection. The catheter was connected to a gravity bag. The catheter was secured at the skin with a Prolene retention suture and StatLock device. FINDINGS: The gallbladder is very distended. After placement of the cholecystostomy tube, there is good drainage of dark colored bile. IMPRESSION: Percutaneous cholecystostomy with placement of 10 French drainage catheter into the gallbladder lumen. This catheter was connected to gravity bag drainage. Electronically Signed   By: Aletta Edouard M.D.   On: 10/06/2016 16:55   Mr Abdomen Mrcp Moise Boring Contast  Result Date: 10/08/2016 CLINICAL DATA:  82 year old male with worsening right upper quadrant abdominal pain from acute cholecystitis, now status post percutaneous drainage catheter placement. EXAM: MRI ABDOMEN WITHOUT AND WITH CONTRAST (INCLUDING MRCP) TECHNIQUE: Multiplanar multisequence MR imaging of the abdomen was performed both before and after the administration of intravenous contrast. Heavily T2-weighted images of the biliary and pancreatic ducts were obtained, and three-dimensional MRCP images were rendered by post processing. CONTRAST:  16 mL of MultiHance COMPARISON:  No prior abdominal MRI. CT the abdomen and pelvis 10/05/2016. FINDINGS: Lower chest: Linear signal intensity in the right lower lobe, favored to reflect subsegmental atelectasis. Hepatobiliary: Heterogeneous loss of signal intensity throughout the hepatic parenchyma on out of phase dual echo images, compatible with a background of hepatic  steatosis. No suspicious cystic or solid hepatic lesions. MRCP images demonstrate no intra or extrahepatic biliary ductal dilatation. New percutaneous cholecystostomy tube in position. Gallbladder is completely decompressed. Increased T2 signal intensity in and around the gallbladder wall, compatible with. Inflammation from acute cholecystitis. Inferior aspect of the gallbladder is incompletely visualized. No filling defect within the common bile duct on MRCP images to suggest choledocholithiasis. Pancreas: No pancreatic mass. No pancreatic ductal dilatation. No pancreatic or peripancreatic fluid or inflammatory changes. Spleen:  Unremarkable. Adrenals/Urinary Tract: There are several well-defined T1 hypointense, T2 hyperintense, nonenhancing lesions in the kidneys bilaterally measuring up to 2.1 cm in the interpolar region of the right kidney, compatible with simple cysts. No hydroureteronephrosis in the visualized portions of the abdomen. Bilateral adrenal glands are normal in appearance. Stomach/Bowel: Visualized portions are unremarkable. Vascular/Lymphatic: Extensive atherosclerosis in the visualized abdominal vasculature, including an ulcerated plaque in the suprarenal/renal abdominal aorta. No aneurysm identified in the visualized abdominal vasculature. Retroaortic left renal vein (normal anatomical variant) incidentally noted. No lymphadenopathy noted in the visualized abdomen. Other: Trace amount of pericholecystic fluid extending beneath the right lobe of the liver. Otherwise, there is no significant volume of ascites noted in the visualized portions of the peritoneal cavity. Musculoskeletal: No aggressive appearing osseous lesions are noted in the visualized portions of the skeleton. IMPRESSION: 1. Status post recent percutaneous cholecystostomy tube placement with complete decompression of the inflamed gallbladder. 2. No choledocholithiasis. No evidence of biliary tract obstruction at this time. 3.  Hepatic steatosis.  4. Aortic atherosclerosis, including ulcerated plaque in the upper abdominal aorta, as above. 5. Additional incidental findings, as above. Electronically Signed   By: Vinnie Langton M.D.   On: 10/08/2016 09:23    ASSESMENT:   *  Acute cholecystitis, s/p 12/17 perc biliary drain.  LFTs steadily improved, near normal.  WBCs normalized.  CT scan shows GB now decompressed.   *  ? Choledocholithiasis. There is none on MRCP.  Did show fatty liver.    *  AKI improved.   PLAN   *  Carb mod diet.   Mgt of perc drain per IR and surgery.      Azucena Freed  10/08/2016, 9:27 AM Pager: [phone]  ________________________________________________________________________  Velora Heckler GI MD note:  I personally examined the patient, reviewed the data and agree with the assessment and plan described above.  MRCP does not show CBD stone, dilation.  Given about 95% accuracy for MRCP, it is probably true that no stone is in the CBD.  He will eventually have GB removed and I suggest IOC during that surgery.  If the IOC is positive, then ERCP can be done post-operatively.  Please call, page with any further questions or concerns. He does not need scheduled GI follow up.   Owens Loffler, MD [facility] Pager [phone]

## 2016-10-08 NOTE — Discharge Summary (Signed)
Physician Discharge Summary  IFEANYI MICKELSON WUJ:[id number] DOB: Jul 11, 1933 DOA: 10/05/2016  PCP: [name], MD  Admit date: 10/05/2016 Discharge date: 10/08/2016  Admitted From: Home Disposition:  Home  Recommendations for Outpatient Follow-up:  1. Follow up with PCP in 1-2 weeks 2. Please obtain CMP/CBC in one week  Home Health:YES Equipment/Devices: HHRN  Discharge Condition:Stable CODE STATUS: FULL Diet recommendation: Heart Healthy  Brief/Interim Summary: 82 y.o.malewith hypertension, hyperlipidemia, PVD, prior CVA (around 2012), basal cell carcinoma, and DM who presented to [facility] with worsening right upper quadrant abdominal pain. He hadbeen having some nausea and vomiting and discomfort of the abdomen since Thanksgiving, but it progressively worsened. He was found to have acute cholecystitis w/ choledocholithiasis w/ cbd stone. Transferred to our facility for eval for possible ERCP.  GI was consulted. MRCP was ordered, and it was performed on 10/07/2016. MRCP was negative for choledocholithiasis. As a result, the patient did not need ERCP. Cholecystotomy tube was placed by IR on 10/06/2016. The patient's abdominal pain improved. His diet was advanced which he tolerated. The patient was cleared for discharge with oral antibiotics. He will follow up with general surgery in the office in approximately 2 weeks. He will need a cholecystectomy in approximately 6 weeks.  Discharge Diagnoses:  Acute Cholecystitis with possible choledocholithiasis -appreciate general surgery-->IR cholecystotomy -12/17--IR cholecystotomy--follow bile cultures-->LFTs trending down -bile cultures negative -appreciate GI-->MRCP -10/05/16 CT abd--cholelithiasis with inflammation with possible choledocholithiaisis -continue zosyn-->home with cipro and flagyl x 11 more days to complete 14 days of tx -10/07/2016 MRCP--negative for choledocholithiasis -10/08/2016--case discussed with  General surgery-->cleared for d/c-->follow up in office in 2 weeks; possible cholecystectomy in 6 weeks.  Acute on chronic renal failure--CKD3 -baseline creatinine 1.2-1.5 -secondary to sepsis -serum creatinine peaked 1.81 -Serum creatinine 1.35 at the time of discharge  Acute respiratory failure with hypoxia -stable on 3 L [place]--> weaned to room air -secondary to atelectasis from hypoventilation and bronchiectasis  IDDM -10/05/16 A1C--7.6 -controlled -Restart home dose of glipizide, Januvia, and 75/25 insulin after discharge.  HTN -hold Hyzaar due to soft BP -reduce dose metoprolol succinate due to soft BP -Patient will go home on his home dose of metoprolol succinate -The patient was instructed to not restart amlodipine or losartan/HCTZ which he has remained off of during the entire hospitalization with good blood pressure control -He will need to follow-up with his PCP for a blood pressure check and possible need to restart his oral agents.  CAD -Has refused prior cardiac eval therefore current quantification not possible  Squamous cell carcinoma of skin/nose Stopped oral chemo agent ~Thanksgiving due to GI symptoms which may have actually be related to gallbladder disease   Discharge Instructions  Discharge Instructions    Diet - low sodium heart healthy    Complete by:  As directed    Increase activity slowly    Complete by:  As directed      Allergies as of 10/08/2016   No Known Allergies     Medication List    STOP taking these medications   amLODipine 10 MG tablet Commonly known as:  NORVASC   losartan-hydrochlorothiazide 100-25 MG tablet Commonly known as:  HYZAAR   meloxicam 15 MG tablet Commonly known as:  MOBIC     TAKE these medications   aspirin EC 81 MG tablet Take 1 tablet (81 mg total) by mouth daily.   atorvastatin 40 MG tablet Commonly known as:  LIPITOR TAKE 1 TABLET BY MOUTH EVERY DAY   cilostazol 100  MG tablet Commonly known  as:  PLETAL TAKE 1 TABLET(100 MG) BY MOUTH TWICE DAILY   ciprofloxacin 500 MG tablet Commonly known as:  CIPRO Take 1 tablet (500 mg total) by mouth 2 (two) times daily.   cyclobenzaprine 5 MG tablet Commonly known as:  FLEXERIL Take 1 tablet (5 mg total) by mouth 3 (three) times daily as needed for muscle spasms.   ERIVEDGE 150 MG capsule Generic drug:  vismodegib Take 1 tablet by mouth daily.   famotidine 20 MG tablet Commonly known as:  PEPCID TAKE 1 TABLET(20 MG) BY MOUTH TWICE DAILY   fenofibrate micronized 134 MG capsule Commonly known as:  LOFIBRA TAKE 1 CAPSULE BY MOUTH EVERY MORNING BEFORE BREAKFAST   finasteride 5 MG tablet Commonly known as:  PROSCAR Take 1 tablet (5 mg total) by mouth daily.   gabapentin 400 MG capsule Commonly known as:  NEURONTIN TAKE 1 CAPSULE(400 MG) BY MOUTH THREE TIMES DAILY   glipiZIDE 10 MG tablet Commonly known as:  GLUCOTROL TAKE 1 TABLET(10 MG) BY MOUTH TWICE DAILY   glucose blood test strip Use as instructed three times daily   HYDROcodone-acetaminophen 5-325 MG tablet Commonly known as:  NORCO/VICODIN One tablet two times daily as needed for knee pain   insulin lispro protamine-lispro (75-25) 100 UNIT/ML Susp injection Commonly known as:  HUMALOG MIX 75/25 Inject 10 units right before  breakfast and 6 units before dinner   INSULIN SYRINGE .5CC/29G 29G X 1/2" 0.5 ML Misc 1 Syringe by Does not apply route 3 (three) times daily.   INSULIN SYRINGE .5CC/31GX5/16" 31G X 5/16" 0.5 ML Misc USE THREE TIMES DAILY   INS SYRINGE/NEEDLE 1CC/28G 28G X 1/2" 1 ML Misc Commonly known as:  B-D INSULIN SYRINGE 1CC/28G USE TO ADMINISTER INSULIN DAILY   JANUVIA 100 MG tablet Generic drug:  sitaGLIPtin TAKE 1 TABLET(100 MG) BY MOUTH DAILY. NOTE DOSE INCREASE   latanoprost 0.005 % ophthalmic solution Commonly known as:  XALATAN INSTILL 1 DROP INTO BOTH EYES QD   magic mouthwash Soln Take 5 mLs by mouth 3 (three) times daily as needed  for mouth pain.   metFORMIN 1000 MG tablet Commonly known as:  GLUCOPHAGE TAKE 1 TABLET BY MOUTH TWICE DAILY   metoprolol succinate 100 MG 24 hr tablet Commonly known as:  TOPROL-XL TAKE 1 TABLET BY MOUTH ONCE DAILY   metroNIDAZOLE 500 MG tablet Commonly known as:  FLAGYL Take 1 tablet (500 mg total) by mouth every 8 (eight) hours.   nitroGLYCERIN 0.4 MG SL tablet Commonly known as:  NITROSTAT Place 1 tablet (0.4 mg total) under the tongue every 5 (five) minutes as needed for chest pain. MAXIMUM 3 TABLETS   ondansetron 4 MG tablet Commonly known as:  ZOFRAN Take 1 tablet (4 mg total) by mouth every 8 (eight) hours as needed for nausea or vomiting.   ONE TOUCH ULTRA SYSTEM KIT w/Device Kit 1 kit by Does not apply route once. Use DX code Z61.09   [facility] DELICA LANCETS 60A Misc Use three times daily to check blood sugar.   pantoprazole 40 MG tablet Commonly known as:  PROTONIX Take 1 tablet (40 mg total) by mouth 2 (two) times daily before a meal.   tamsulosin 0.4 MG Caps capsule Commonly known as:  FLOMAX Take 0.4 mg by mouth daily after breakfast.   traMADol 50 MG tablet Commonly known as:  ULTRAM Take 50 mg by mouth every 6 (six) hours as needed.      Follow-up Information  Gayland Curry, MD. Schedule an appointment as soon as possible for a visit in 4 week(s).   Specialty:  General Surgery Why:  Call to confirm your appointment that has been scheduled for you, it should be roughly 4 weeks from your discharge from the hospital Contact information: [address] [place] [address] [phone]          No Known Allergies  Consultations:   GI  CCS   Procedures/Studies: X-ray Chest Pa And Lateral  Result Date: 10/06/2016 CLINICAL DATA:  Cholecystitis. EXAM: CHEST  2 VIEW COMPARISON:  10/04/2016 FINDINGS: Right basilar airspace opacity noted, anteriorly on the lateral view compatible with right middle lobe atelectasis or  pneumonia. Minimal left base atelectasis or scarring. No effusions. Heart is normal size. IMPRESSION: Right middle lobe atelectasis or pneumonia. Minimal left base atelectasis or scarring. Electronically Signed   By: Rolm Baptise M.D.   On: 10/06/2016 10:49   Dg Chest 2 View  Result Date: 10/04/2016 CLINICAL DATA:  Hypoxia, chest pain at night. EXAM: CHEST  2 VIEW COMPARISON:  08/07/2015 FINDINGS: Cardiomediastinal silhouette is normal. Mediastinal contours appear intact. Calcific atherosclerotic disease and tortuosity of the aorta are noted. There is no evidence of focal airspace consolidation, pleural effusion or pneumothorax. Osseous structures are without acute abnormality. Ankylosing changes of the thoracic spine are noted. Soft tissues are grossly normal. IMPRESSION: No active cardiopulmonary disease. Calcific atherosclerotic disease of the aorta. Electronically Signed   By: Fidela Salisbury M.D.   On: 10/04/2016 14:49   Ct Chest W Contrast  Result Date: 10/05/2016 CLINICAL DATA:  Nausea, vomiting and right-sided abdominal pain over the last 3 weeks. Hypoxia . EXAM: CT CHEST, ABDOMEN, AND PELVIS WITH CONTRAST TECHNIQUE: Multidetector CT imaging of the chest, abdomen and pelvis was performed following the standard protocol during bolus administration of intravenous contrast. CONTRAST:  56m ISOVUE-300 IOPAMIDOL (ISOVUE-300) INJECTION 61% COMPARISON:  04/10/2016 FINDINGS: CT CHEST FINDINGS Cardiovascular: Heart size is normal. No pericardial fluid. Extensive coronary artery calcification. Aortic atherosclerotic calcification. No pulmonary arterial lesion seen, though opacity is not sufficient to rule out emboli. Mediastinum/Nodes: No mass or lymphadenopathy. Lungs/Pleura: Mild emphysema and scarring in the upper lobes. Linear markings in the lower lobes probably represent a combination of scarring and atelectasis, particularly in the right lower lobe. In general, the bronchi are large consistent  with bronchiectasis. No pleural fluid or pleural lesion. Musculoskeletal: No thoracic fracture. Upper thoracic degenerative disc disease. Mid and lower thoracic ankylosis. CT ABDOMEN PELVIS FINDINGS Hepatobiliary: Liver parenchyma shows mild fatty change. The gallbladder is markedly distended and there is surrounding inflammatory change. There are at least diffuse small stones dependent in the gallbladder. Findings are worrisome for acute cholecystitis by CT. Likely 4-5 mm common duct stone. There could be other noncalcified or less calcified ductal stones as well. Pancreas: Negative Spleen: Normal Adrenals/Urinary Tract: Adrenal glands are normal. Multiple bilateral benign appearing renal cysts. Stomach/Bowel: No primary bowel pathology. Diverticulosis without evidence of diverticulitis. Vascular/Lymphatic: Aortic atherosclerosis, advanced. No aneurysm. IVC is normal. No retroperitoneal adenopathy. Reproductive: Negative Other: No ascites or free air. Musculoskeletal: Extensive chronic degenerative changes of the lumbar spine including 8 mm anterolisthesis L4-5. IMPRESSION: Chest: Underlying emphysema. Underlying bronchiectasis. Scarring in the lower lungs, probably with some atelectasis in the right lower lobe. Extensive coronary artery calcification. Extensive aortic atherosclerosis. Abdomen: Probable acute cholecystitis. The gallbladder is markedly distended and there is surrounding inflammatory change. At least a few small stones dependent in the gallbladder. High suspicion of  common duct stone or stones. Advanced aortic atherosclerosis. Diverticulosis without evidence of diverticulitis. Electronically Signed   By: Nelson Chimes M.D.   On: 10/05/2016 09:14   Ct Abdomen Pelvis W Contrast  Result Date: 10/05/2016 CLINICAL DATA:  Nausea, vomiting and right-sided abdominal pain over the last 3 weeks. Hypoxia . EXAM: CT CHEST, ABDOMEN, AND PELVIS WITH CONTRAST TECHNIQUE: Multidetector CT imaging of the chest,  abdomen and pelvis was performed following the standard protocol during bolus administration of intravenous contrast. CONTRAST:  91m ISOVUE-300 IOPAMIDOL (ISOVUE-300) INJECTION 61% COMPARISON:  04/10/2016 FINDINGS: CT CHEST FINDINGS Cardiovascular: Heart size is normal. No pericardial fluid. Extensive coronary artery calcification. Aortic atherosclerotic calcification. No pulmonary arterial lesion seen, though opacity is not sufficient to rule out emboli. Mediastinum/Nodes: No mass or lymphadenopathy. Lungs/Pleura: Mild emphysema and scarring in the upper lobes. Linear markings in the lower lobes probably represent a combination of scarring and atelectasis, particularly in the right lower lobe. In general, the bronchi are large consistent with bronchiectasis. No pleural fluid or pleural lesion. Musculoskeletal: No thoracic fracture. Upper thoracic degenerative disc disease. Mid and lower thoracic ankylosis. CT ABDOMEN PELVIS FINDINGS Hepatobiliary: Liver parenchyma shows mild fatty change. The gallbladder is markedly distended and there is surrounding inflammatory change. There are at least diffuse small stones dependent in the gallbladder. Findings are worrisome for acute cholecystitis by CT. Likely 4-5 mm common duct stone. There could be other noncalcified or less calcified ductal stones as well. Pancreas: Negative Spleen: Normal Adrenals/Urinary Tract: Adrenal glands are normal. Multiple bilateral benign appearing renal cysts. Stomach/Bowel: No primary bowel pathology. Diverticulosis without evidence of diverticulitis. Vascular/Lymphatic: Aortic atherosclerosis, advanced. No aneurysm. IVC is normal. No retroperitoneal adenopathy. Reproductive: Negative Other: No ascites or free air. Musculoskeletal: Extensive chronic degenerative changes of the lumbar spine including 8 mm anterolisthesis L4-5. IMPRESSION: Chest: Underlying emphysema. Underlying bronchiectasis. Scarring in the lower lungs, probably with some  atelectasis in the right lower lobe. Extensive coronary artery calcification. Extensive aortic atherosclerosis. Abdomen: Probable acute cholecystitis. The gallbladder is markedly distended and there is surrounding inflammatory change. At least a few small stones dependent in the gallbladder. High suspicion of common duct stone or stones. Advanced aortic atherosclerosis. Diverticulosis without evidence of diverticulitis. Electronically Signed   By: MNelson ChimesM.D.   On: 10/05/2016 09:14   Ir Perc Cholecystostomy  Result Date: 10/06/2016 INDICATION: Acute cholecystitis and need for percutaneous cholecystostomy due to contraindication currently for cholecystectomy. EXAM: CHOLECYSTOSTOMY MEDICATIONS: The patient had received scheduled IV Zosyn approximately 4 hours prior to the procedure. ANESTHESIA/SEDATION: Moderate (conscious) sedation was employed during this procedure. A total of Versed 2.0 mg and Fentanyl 100 mcg was administered intravenously. Moderate Sedation Time: 10 minutes. The patient's level of consciousness and vital signs were monitored continuously by radiology nursing throughout the procedure under my direct supervision. FLUOROSCOPY TIME:  Fluoroscopy Time: 42 seconds. (5.0 mGy). COMPLICATIONS: None immediate. PROCEDURE: Informed written consent was obtained from the patient after a thorough discussion of the procedural risks, benefits and alternatives. All questions were addressed. Maximal Sterile Barrier Technique was utilized including caps, mask, sterile gowns, sterile gloves, sterile drape, hand hygiene and skin antiseptic. A timeout was performed prior to the initiation of the procedure. Ultrasound was used to localize the gallbladder. Under direct ultrasound guidance, a 21 gauge needle was advanced into the gallbladder lumen. Ultrasound image documentation was performed. A bile sample was aspirated. A transitional dilator was advanced over the guidewire. The percutaneous tract was dilated  and a 10 French pigtail drainage  catheter advanced into the gallbladder lumen. Catheter position was confirmed by fluoroscopy after contrast injection. The catheter was connected to a gravity bag. The catheter was secured at the skin with a Prolene retention suture and StatLock device. FINDINGS: The gallbladder is very distended. After placement of the cholecystostomy tube, there is good drainage of dark colored bile. IMPRESSION: Percutaneous cholecystostomy with placement of 10 French drainage catheter into the gallbladder lumen. This catheter was connected to gravity bag drainage. Electronically Signed   By: Aletta Edouard M.D.   On: 10/06/2016 16:55   Mr Abdomen Mrcp Moise Boring Contast  Result Date: 10/08/2016 CLINICAL DATA:  82 year old male with worsening right upper quadrant abdominal pain from acute cholecystitis, now status post percutaneous drainage catheter placement. EXAM: MRI ABDOMEN WITHOUT AND WITH CONTRAST (INCLUDING MRCP) TECHNIQUE: Multiplanar multisequence MR imaging of the abdomen was performed both before and after the administration of intravenous contrast. Heavily T2-weighted images of the biliary and pancreatic ducts were obtained, and three-dimensional MRCP images were rendered by post processing. CONTRAST:  16 mL of MultiHance COMPARISON:  No prior abdominal MRI. CT the abdomen and pelvis 10/05/2016. FINDINGS: Lower chest: Linear signal intensity in the right lower lobe, favored to reflect subsegmental atelectasis. Hepatobiliary: Heterogeneous loss of signal intensity throughout the hepatic parenchyma on out of phase dual echo images, compatible with a background of hepatic steatosis. No suspicious cystic or solid hepatic lesions. MRCP images demonstrate no intra or extrahepatic biliary ductal dilatation. New percutaneous cholecystostomy tube in position. Gallbladder is completely decompressed. Increased T2 signal intensity in and around the gallbladder wall, compatible with. Inflammation  from acute cholecystitis. Inferior aspect of the gallbladder is incompletely visualized. No filling defect within the common bile duct on MRCP images to suggest choledocholithiasis. Pancreas: No pancreatic mass. No pancreatic ductal dilatation. No pancreatic or peripancreatic fluid or inflammatory changes. Spleen:  Unremarkable. Adrenals/Urinary Tract: There are several well-defined T1 hypointense, T2 hyperintense, nonenhancing lesions in the kidneys bilaterally measuring up to 2.1 cm in the interpolar region of the right kidney, compatible with simple cysts. No hydroureteronephrosis in the visualized portions of the abdomen. Bilateral adrenal glands are normal in appearance. Stomach/Bowel: Visualized portions are unremarkable. Vascular/Lymphatic: Extensive atherosclerosis in the visualized abdominal vasculature, including an ulcerated plaque in the suprarenal/renal abdominal aorta. No aneurysm identified in the visualized abdominal vasculature. Retroaortic left renal vein (normal anatomical variant) incidentally noted. No lymphadenopathy noted in the visualized abdomen. Other: Trace amount of pericholecystic fluid extending beneath the right lobe of the liver. Otherwise, there is no significant volume of ascites noted in the visualized portions of the peritoneal cavity. Musculoskeletal: No aggressive appearing osseous lesions are noted in the visualized portions of the skeleton. IMPRESSION: 1. Status post recent percutaneous cholecystostomy tube placement with complete decompression of the inflamed gallbladder. 2. No choledocholithiasis. No evidence of biliary tract obstruction at this time. 3. Hepatic steatosis. 4. Aortic atherosclerosis, including ulcerated plaque in the upper abdominal aorta, as above. 5. Additional incidental findings, as above. Electronically Signed   By: Vinnie Langton M.D.   On: 10/08/2016 09:23         Discharge Exam: Vitals:   10/07/16 2126 10/08/16 0457  BP: (!) 118/59 (!)  144/67  Pulse: 77 91  Resp: 19 19  Temp: 98.4 F (36.9 C) 97.9 F (36.6 C)   Vitals:   10/07/16 0900 10/07/16 1034 10/07/16 2126 10/08/16 0457  BP:  (!) 120/57 (!) 118/59 (!) 144/67  Pulse: 69 77 77 91  Resp: _0 19  Temp:  97.7 F (36.5 C) 98.4 F (36.9 C) 97.9 F (36.6 C)  TempSrc:  Oral Oral Oral  SpO2: 96% 95% 96% 94%  Weight:  83 kg (183 lb)    Height:  _0  (1.753 m)      General: Pt is alert, awake, not in acute distress Cardiovascular: RRR, S1/S2 +, no rubs, no gallops Respiratory: CTA bilaterally, no wheezing, no rhonchi Abdominal: Soft, NT, ND, bowel sounds + Extremities: no edema, no cyanosis   The results of significant diagnostics from this hospitalization (including imaging, microbiology, ancillary and laboratory) are listed below for reference.    Significant Diagnostic Studies: X-ray Chest Pa And Lateral  Result Date: 10/06/2016 CLINICAL DATA:  Cholecystitis. EXAM: CHEST  2 VIEW COMPARISON:  10/04/2016 FINDINGS: Right basilar airspace opacity noted, anteriorly on the lateral view compatible with right middle lobe atelectasis or pneumonia. Minimal left base atelectasis or scarring. No effusions. Heart is normal size. IMPRESSION: Right middle lobe atelectasis or pneumonia. Minimal left base atelectasis or scarring. Electronically Signed   By: Rolm Baptise M.D.   On: 10/06/2016 10:49   Dg Chest 2 View  Result Date: 10/04/2016 CLINICAL DATA:  Hypoxia, chest pain at night. EXAM: CHEST  2 VIEW COMPARISON:  08/07/2015 FINDINGS: Cardiomediastinal silhouette is normal. Mediastinal contours appear intact. Calcific atherosclerotic disease and tortuosity of the aorta are noted. There is no evidence of focal airspace consolidation, pleural effusion or pneumothorax. Osseous structures are without acute abnormality. Ankylosing changes of the thoracic spine are noted. Soft tissues are grossly normal. IMPRESSION: No active cardiopulmonary disease. Calcific atherosclerotic  disease of the aorta. Electronically Signed   By: Fidela Salisbury M.D.   On: 10/04/2016 14:49   Ct Chest W Contrast  Result Date: 10/05/2016 CLINICAL DATA:  Nausea, vomiting and right-sided abdominal pain over the last 3 weeks. Hypoxia . EXAM: CT CHEST, ABDOMEN, AND PELVIS WITH CONTRAST TECHNIQUE: Multidetector CT imaging of the chest, abdomen and pelvis was performed following the standard protocol during bolus administration of intravenous contrast. CONTRAST:  74m ISOVUE-300 IOPAMIDOL (ISOVUE-300) INJECTION 61% COMPARISON:  04/10/2016 FINDINGS: CT CHEST FINDINGS Cardiovascular: Heart size is normal. No pericardial fluid. Extensive coronary artery calcification. Aortic atherosclerotic calcification. No pulmonary arterial lesion seen, though opacity is not sufficient to rule out emboli. Mediastinum/Nodes: No mass or lymphadenopathy. Lungs/Pleura: Mild emphysema and scarring in the upper lobes. Linear markings in the lower lobes probably represent a combination of scarring and atelectasis, particularly in the right lower lobe. In general, the bronchi are large consistent with bronchiectasis. No pleural fluid or pleural lesion. Musculoskeletal: No thoracic fracture. Upper thoracic degenerative disc disease. Mid and lower thoracic ankylosis. CT ABDOMEN PELVIS FINDINGS Hepatobiliary: Liver parenchyma shows mild fatty change. The gallbladder is markedly distended and there is surrounding inflammatory change. There are at least diffuse small stones dependent in the gallbladder. Findings are worrisome for acute cholecystitis by CT. Likely 4-5 mm common duct stone. There could be other noncalcified or less calcified ductal stones as well. Pancreas: Negative Spleen: Normal Adrenals/Urinary Tract: Adrenal glands are normal. Multiple bilateral benign appearing renal cysts. Stomach/Bowel: No primary bowel pathology. Diverticulosis without evidence of diverticulitis. Vascular/Lymphatic: Aortic atherosclerosis,  advanced. No aneurysm. IVC is normal. No retroperitoneal adenopathy. Reproductive: Negative Other: No ascites or free air. Musculoskeletal: Extensive chronic degenerative changes of the lumbar spine including 8 mm anterolisthesis L4-5. IMPRESSION: Chest: Underlying emphysema. Underlying bronchiectasis. Scarring in the lower lungs, probably with some atelectasis in the right lower lobe. Extensive coronary artery calcification. Extensive aortic  atherosclerosis. Abdomen: Probable acute cholecystitis. The gallbladder is markedly distended and there is surrounding inflammatory change. At least a few small stones dependent in the gallbladder. High suspicion of common duct stone or stones. Advanced aortic atherosclerosis. Diverticulosis without evidence of diverticulitis. Electronically Signed   By: Nelson Chimes M.D.   On: 10/05/2016 09:14   Ct Abdomen Pelvis W Contrast  Result Date: 10/05/2016 CLINICAL DATA:  Nausea, vomiting and right-sided abdominal pain over the last 3 weeks. Hypoxia . EXAM: CT CHEST, ABDOMEN, AND PELVIS WITH CONTRAST TECHNIQUE: Multidetector CT imaging of the chest, abdomen and pelvis was performed following the standard protocol during bolus administration of intravenous contrast. CONTRAST:  65m ISOVUE-300 IOPAMIDOL (ISOVUE-300) INJECTION 61% COMPARISON:  04/10/2016 FINDINGS: CT CHEST FINDINGS Cardiovascular: Heart size is normal. No pericardial fluid. Extensive coronary artery calcification. Aortic atherosclerotic calcification. No pulmonary arterial lesion seen, though opacity is not sufficient to rule out emboli. Mediastinum/Nodes: No mass or lymphadenopathy. Lungs/Pleura: Mild emphysema and scarring in the upper lobes. Linear markings in the lower lobes probably represent a combination of scarring and atelectasis, particularly in the right lower lobe. In general, the bronchi are large consistent with bronchiectasis. No pleural fluid or pleural lesion. Musculoskeletal: No thoracic fracture.  Upper thoracic degenerative disc disease. Mid and lower thoracic ankylosis. CT ABDOMEN PELVIS FINDINGS Hepatobiliary: Liver parenchyma shows mild fatty change. The gallbladder is markedly distended and there is surrounding inflammatory change. There are at least diffuse small stones dependent in the gallbladder. Findings are worrisome for acute cholecystitis by CT. Likely 4-5 mm common duct stone. There could be other noncalcified or less calcified ductal stones as well. Pancreas: Negative Spleen: Normal Adrenals/Urinary Tract: Adrenal glands are normal. Multiple bilateral benign appearing renal cysts. Stomach/Bowel: No primary bowel pathology. Diverticulosis without evidence of diverticulitis. Vascular/Lymphatic: Aortic atherosclerosis, advanced. No aneurysm. IVC is normal. No retroperitoneal adenopathy. Reproductive: Negative Other: No ascites or free air. Musculoskeletal: Extensive chronic degenerative changes of the lumbar spine including 8 mm anterolisthesis L4-5. IMPRESSION: Chest: Underlying emphysema. Underlying bronchiectasis. Scarring in the lower lungs, probably with some atelectasis in the right lower lobe. Extensive coronary artery calcification. Extensive aortic atherosclerosis. Abdomen: Probable acute cholecystitis. The gallbladder is markedly distended and there is surrounding inflammatory change. At least a few small stones dependent in the gallbladder. High suspicion of common duct stone or stones. Advanced aortic atherosclerosis. Diverticulosis without evidence of diverticulitis. Electronically Signed   By: MNelson ChimesM.D.   On: 10/05/2016 09:14   Ir Perc Cholecystostomy  Result Date: 10/06/2016 INDICATION: Acute cholecystitis and need for percutaneous cholecystostomy due to contraindication currently for cholecystectomy. EXAM: CHOLECYSTOSTOMY MEDICATIONS: The patient had received scheduled IV Zosyn approximately 4 hours prior to the procedure. ANESTHESIA/SEDATION: Moderate (conscious)  sedation was employed during this procedure. A total of Versed 2.0 mg and Fentanyl 100 mcg was administered intravenously. Moderate Sedation Time: 10 minutes. The patient's level of consciousness and vital signs were monitored continuously by radiology nursing throughout the procedure under my direct supervision. FLUOROSCOPY TIME:  Fluoroscopy Time: 42 seconds. (5.0 mGy). COMPLICATIONS: None immediate. PROCEDURE: Informed written consent was obtained from the patient after a thorough discussion of the procedural risks, benefits and alternatives. All questions were addressed. Maximal Sterile Barrier Technique was utilized including caps, mask, sterile gowns, sterile gloves, sterile drape, hand hygiene and skin antiseptic. A timeout was performed prior to the initiation of the procedure. Ultrasound was used to localize the gallbladder. Under direct ultrasound guidance, a 21 gauge needle was advanced into the gallbladder lumen. Ultrasound  image documentation was performed. A bile sample was aspirated. A transitional dilator was advanced over the guidewire. The percutaneous tract was dilated and a 10 French pigtail drainage catheter advanced into the gallbladder lumen. Catheter position was confirmed by fluoroscopy after contrast injection. The catheter was connected to a gravity bag. The catheter was secured at the skin with a Prolene retention suture and StatLock device. FINDINGS: The gallbladder is very distended. After placement of the cholecystostomy tube, there is good drainage of dark colored bile. IMPRESSION: Percutaneous cholecystostomy with placement of 10 French drainage catheter into the gallbladder lumen. This catheter was connected to gravity bag drainage. Electronically Signed   By: Aletta Edouard M.D.   On: 10/06/2016 16:55   Mr Abdomen Mrcp Moise Boring Contast  Result Date: 10/08/2016 CLINICAL DATA:  82 year old male with worsening right upper quadrant abdominal pain from acute cholecystitis, now status  post percutaneous drainage catheter placement. EXAM: MRI ABDOMEN WITHOUT AND WITH CONTRAST (INCLUDING MRCP) TECHNIQUE: Multiplanar multisequence MR imaging of the abdomen was performed both before and after the administration of intravenous contrast. Heavily T2-weighted images of the biliary and pancreatic ducts were obtained, and three-dimensional MRCP images were rendered by post processing. CONTRAST:  16 mL of MultiHance COMPARISON:  No prior abdominal MRI. CT the abdomen and pelvis 10/05/2016. FINDINGS: Lower chest: Linear signal intensity in the right lower lobe, favored to reflect subsegmental atelectasis. Hepatobiliary: Heterogeneous loss of signal intensity throughout the hepatic parenchyma on out of phase dual echo images, compatible with a background of hepatic steatosis. No suspicious cystic or solid hepatic lesions. MRCP images demonstrate no intra or extrahepatic biliary ductal dilatation. New percutaneous cholecystostomy tube in position. Gallbladder is completely decompressed. Increased T2 signal intensity in and around the gallbladder wall, compatible with. Inflammation from acute cholecystitis. Inferior aspect of the gallbladder is incompletely visualized. No filling defect within the common bile duct on MRCP images to suggest choledocholithiasis. Pancreas: No pancreatic mass. No pancreatic ductal dilatation. No pancreatic or peripancreatic fluid or inflammatory changes. Spleen:  Unremarkable. Adrenals/Urinary Tract: There are several well-defined T1 hypointense, T2 hyperintense, nonenhancing lesions in the kidneys bilaterally measuring up to 2.1 cm in the interpolar region of the right kidney, compatible with simple cysts. No hydroureteronephrosis in the visualized portions of the abdomen. Bilateral adrenal glands are normal in appearance. Stomach/Bowel: Visualized portions are unremarkable. Vascular/Lymphatic: Extensive atherosclerosis in the visualized abdominal vasculature, including an  ulcerated plaque in the suprarenal/renal abdominal aorta. No aneurysm identified in the visualized abdominal vasculature. Retroaortic left renal vein (normal anatomical variant) incidentally noted. No lymphadenopathy noted in the visualized abdomen. Other: Trace amount of pericholecystic fluid extending beneath the right lobe of the liver. Otherwise, there is no significant volume of ascites noted in the visualized portions of the peritoneal cavity. Musculoskeletal: No aggressive appearing osseous lesions are noted in the visualized portions of the skeleton. IMPRESSION: 1. Status post recent percutaneous cholecystostomy tube placement with complete decompression of the inflamed gallbladder. 2. No choledocholithiasis. No evidence of biliary tract obstruction at this time. 3. Hepatic steatosis. 4. Aortic atherosclerosis, including ulcerated plaque in the upper abdominal aorta, as above. 5. Additional incidental findings, as above. Electronically Signed   By: Vinnie Langton M.D.   On: 10/08/2016 09:23     Microbiology: Recent Results (from the past 240 hour(s))  Blood culture (routine x 2)     Status: None (Preliminary result)   Collection Time: 10/05/16  7:13 AM  Result Value Ref Range Status   Specimen Description BLOOD  RT  FOREAM  Final   Special Requests BOTTLES DRAWN AEROBIC AND ANAEROBIC 6CC  Final   Culture NO GROWTH 3 DAYS  Final   Report Status PENDING  Incomplete  Blood culture (routine x 2)     Status: None (Preliminary result)   Collection Time: 10/05/16  7:13 AM  Result Value Ref Range Status   Specimen Description BLOOD LEFT IV  Final   Special Requests BOTTLES DRAWN AEROBIC AND ANAEROBIC [place]  Final   Culture NO GROWTH 3 DAYS  Final   Report Status PENDING  Incomplete  Urine culture     Status: None   Collection Time: 10/05/16  7:13 AM  Result Value Ref Range Status   Specimen Description URINE, RANDOM  Final   Special Requests NONE  Final   Culture NO GROWTH Performed at [facility]   Final   Report Status 10/06/2016 FINAL  Final  Surgical PCR screen     Status: None   Collection Time: 10/05/16 12:29 PM  Result Value Ref Range Status   MRSA, PCR NEGATIVE NEGATIVE Final   Staphylococcus aureus NEGATIVE NEGATIVE Final    Comment:        The Xpert SA Assay (FDA approved for NASAL specimens in patients over 65 years of age), is one component of a comprehensive surveillance program.  Test performance has been validated by [facility] for patients greater than or equal to 53 year old. It is not intended to diagnose infection nor to guide or monitor treatment.   Culture, body fluid-bottle     Status: None (Preliminary result)   Collection Time: 10/06/16 11:07 AM  Result Value Ref Range Status   Specimen Description GALL BLADDER  Final   Special Requests NONE  Final   Culture NO GROWTH 2 DAYS  Final   Report Status PENDING  Incomplete  Gram stain     Status: None   Collection Time: 10/06/16 11:07 AM  Result Value Ref Range Status   Specimen Description GALL BLADDER  Final   Special Requests NONE  Final   Gram Stain NO WBC SEEN NO ORGANISMS SEEN   Final   Report Status 10/06/2016 FINAL  Final     Labs: Basic Metabolic Panel:  Recent Labs Lab 10/04/16 0942 10/05/16 0630 10/06/16 0232 10/07/16 0220 10/08/16 0752  NA 133* 129* 136 134* 135  K 4.9 4.5 4.1 4.1 3.9  CL 93* 94* 103 101 101  CO2 32 _0 GLUCOSE 190* 177* 110* 141* 180*  BUN 30* 30* _1 CREATININE 1.76* 1.81* 1.59* 1.64* 1.35*  CALCIUM 9.5 8.9 8.5* 8.4* 8.8*   Liver Function Tests:  Recent Labs Lab 10/04/16 0942 10/05/16 0630 10/06/16 0232 10/07/16 0220 10/08/16 0752  AST 75* 130* 136* 53* 30  ALT 63* 131* 181* 111* 78*  ALKPHOS 58 59 75 69 66  BILITOT 1.1 1.7* 1.8* 1.1 0.8  PROT 7.1 7.2 6.1* 6.2* 6.7  ALBUMIN 4.7 4.0 2.8* 2.7* 3.0*    Recent Labs Lab 10/05/16 0630  LIPASE <10*   No results for input(s): AMMONIA in the last 168  hours. CBC:  Recent Labs Lab 10/05/16 0630 10/06/16 0232 10/07/16 0220 10/08/16 0752  WBC 20.9* 13.3* 8.7 5.5  HGB 13.0 11.9* 11.1* 12.4*  HCT 39.2* 37.2* 34.9* 37.5*  MCV 85.5 86.9 86.6 84.3  PLT 293 226 236 260   Cardiac Enzymes:  Recent Labs Lab 10/04/16 1052  CKTOTAL 153  CKMB 5.1*   BNP:  Invalid input(s): POCBNP CBG:  Recent Labs Lab 10/07/16 1142 10/07/16 1631 10/07/16 2120 10/08/16 0758 10/08/16 1223  GLUCAP 235* 169* 220* 186* 181*    Time coordinating discharge:  Greater than 30 minutes  Signed:  Sanayah Munro, DO [facility] Pager: [phone] 10/08/2016, 1:39 PM

## 2016-10-08 NOTE — Care Management Note (Signed)
Case Management Note  Patient Details  Name: Henry Lindsey MRN: [id number] Date of Birth: Dec 03, 1932  Subjective/Objective:                    Action/Plan:  Confirmed face sheet information with patient and wife  Expected Discharge Date:                  Expected Discharge Plan:  [place]  In-House Referral:     Discharge planning Services  CM Consult  Post Acute Care Choice:  Home Health Choice offered to:  Patient, Spouse  DME Arranged:    DME Agency:     HH Arranged:  RN [place] Agency:  [facility]  Status of Service:  Completed, signed off  If discussed at [place] of Stay Meetings, dates discussed:    Additional Comments:  Marilu Favre, RN 10/08/2016, 2:00 PM

## 2016-10-08 NOTE — Telephone Encounter (Signed)
Attempted to reach patient, unable to leave a message, scheduled for follow up/ [facility] on 10/22/16 at 830am.  TCM attempted 1 call. Will try again tomorrow

## 2016-10-08 NOTE — Progress Notes (Signed)
Inpatient Diabetes Program Recommendations  AACE/ADA: New Consensus Statement on Inpatient Glycemic Control (2015)  Target Ranges:  Prepandial:   less than 140 mg/dL      Peak postprandial:   less than 180 mg/dL (1-2 hours)      Critically ill patients:  140 - 180 mg/dL   Results for PERLEY, MCCLEMENTS (MRN [id number]) as of 10/08/2016 09:10  Ref. Range 10/07/2016 08:19 10/07/2016 11:42 10/07/2016 16:31 10/07/2016 21:20 10/08/2016 07:58  Glucose-Capillary Latest Ref Range: 65 - 99 mg/dL 144 (H) 235 (H) 169 (H) 220 (H) 186 (H)   Review of Glycemic Control  Diabetes history: DM 2 Outpatient Diabetes medications: Glipizide 10 mg BID, Metformin 1000 mg BID, Januvia 100 Daily, 75/25 10 units QAM 6 units QPM Current orders for Inpatient glycemic control: Novolog Sensitive TID  Inpatient Diabetes Program Recommendations:   Glucose 220 mg/dl last night. Glucose trends have increased since diet started. Please consider increasing correction to Novolog Moderate and add Novolog HS scale.   Thanks,  Tama Headings RN, MSN, [facility] Inpatient Diabetes Coordinator Team Pager [phone] (8a-5p)

## 2016-10-08 NOTE — Care Management Note (Signed)
Case Management Note  Patient Details  Name: Henry Lindsey MRN: [id number] Date of Birth: [birth year]  Subjective/Objective:                    Action/Plan:   Expected Discharge Date:                  Expected Discharge Plan:  Home/Self Care  In-House Referral:     Discharge planning Services     Post Acute Care Choice:    Choice offered to:     DME Arranged:    DME Agency:     HH Arranged:    [place] Agency:     Status of Service:  Completed, signed off  If discussed at [employer] of Stay Meetings, dates discussed:    Additional Comments:  Marilu Favre, RN 10/08/2016, 10:31 AM

## 2016-10-08 NOTE — Telephone Encounter (Signed)
Patient was discharge 12/19 from [facility]  Pt was admitted from acute chololithiasis  Please give a time and date to place pt on Dr Derrel Nip schedule  Pt Contact [phone]

## 2016-10-08 NOTE — Consult Note (Signed)
            [facility] CM Primary Care Navigator  10/08/2016  Henry Lindsey 1933/10/02 [id number]    Went to see patient at the bedside to identify possible discharge needs but he was just discharged per staff.  Patient was discharged home today. Primary care provider's office called [name]) to notify of patient's discharge and need for post hospital follow-up and transition of care.  Made aware to refer patient to [name] care management for care coordination needs if deemed appropriate.  For additional information, please contact:  Aikeem Lilley A. Jaysun Wessels, BSN, RN-BC [facility] PRIMARY CARE Navigator Cell: [phone]

## 2016-10-08 NOTE — Discharge Planning (Signed)
Patient discharged home in stable condition. Verbalizes understanding of all discharge instructions, including home medications and follow up appointments. 

## 2016-10-09 NOTE — Telephone Encounter (Signed)
See below , thanks

## 2016-10-09 NOTE — Telephone Encounter (Signed)
Transition Care Management Follow-up Telephone Call  How have you been since you were released from the hospital? [place]   Do you understand why you were in the hospital? Yes.    Do you understand the discharge instrcutions? No questions  Items Reviewed:  Medications reviewed: yes, stopped a med due to a antibiotics, and losartan until the antibiotics are done (28 days)  Allergies reviewed: yes, no changes  Dietary changes reviewed: yes, eat like a heart patient  Referrals reviewed: Dr. Redmond Pulling, on January 25th   Functional Questionnaire:   Activities of Daily Living (ADLs):   He states they are independent in the following:  States they require assistance with the following:    Any transportation issues/concerns?: no   Any patient concerns? no   Confirmed importance and date/time of follow-up visits scheduled: January 2nd, 2018   Confirmed with patient if condition begins to worsen call PCP or go to the ER.  Patient was given the Call-a-Nurse line [phone]: yes

## 2016-10-10 LAB — CULTURE, BLOOD (ROUTINE X 2)
Culture: NO GROWTH
Culture: NO GROWTH

## 2016-10-11 ENCOUNTER — Other Ambulatory Visit: Payer: Self-pay

## 2016-10-11 ENCOUNTER — Telehealth: Payer: Self-pay | Admitting: Cardiovascular Disease

## 2016-10-11 DIAGNOSIS — R079 Chest pain, unspecified: Secondary | ICD-10-CM

## 2016-10-11 LAB — CULTURE, BODY FLUID W GRAM STAIN -BOTTLE: Culture: NO GROWTH

## 2016-10-11 LAB — CULTURE, BODY FLUID-BOTTLE

## 2016-10-11 NOTE — Telephone Encounter (Signed)
Minna Merritts, MD at 10/04/2016 10:00 AM   Status: Signed    Patient seen by Dr. Derrel Nip, Having chest pain Known CAD, PAD Needs stress test Recommend lexiscan Myoview, follow up in clinic after test       S/w pt who denies having any chest pain. States he saw Dr. Derrel Nip Dec 15 for abdominal pain. He was not feeling better on Dec 16 and proceeded to [facility] ER, transferred to [facility] for possible common bile duct stones/ERCP. Cholecystotomy tube placed 12/17 and pt discharged w/oral abx and instructions to f/u w/gen surgery in two weeks and cholecystectomy in approximately 6 weeks. As sx in PCP's office were not cardiac, he does not want to proceed w/myoview as suggested. Routed to MD to make aware.

## 2016-10-11 NOTE — Progress Notes (Unsigned)
img 

## 2016-10-20 ENCOUNTER — Inpatient Hospital Stay
Admission: EM | Admit: 2016-10-20 | Discharge: 2016-10-23 | DRG: 872 | Disposition: A | Discharge status: Home Health | Payer: Medicare Other | Attending: Internal Medicine | Admitting: Internal Medicine

## 2016-10-20 ENCOUNTER — Emergency Department: Payer: Medicare Other

## 2016-10-20 DIAGNOSIS — E1151 Type 2 diabetes mellitus with diabetic peripheral angiopathy without gangrene: Secondary | ICD-10-CM | POA: Diagnosis present

## 2016-10-20 DIAGNOSIS — R000 Tachycardia, unspecified: Secondary | ICD-10-CM | POA: Diagnosis present

## 2016-10-20 DIAGNOSIS — I10 Essential (primary) hypertension: Secondary | ICD-10-CM | POA: Diagnosis present

## 2016-10-20 DIAGNOSIS — N400 Enlarged prostate without lower urinary tract symptoms: Secondary | ICD-10-CM | POA: Diagnosis present

## 2016-10-20 DIAGNOSIS — Z9889 Other specified postprocedural states: Secondary | ICD-10-CM

## 2016-10-20 DIAGNOSIS — R1011 Right upper quadrant pain: Secondary | ICD-10-CM | POA: Diagnosis not present

## 2016-10-20 DIAGNOSIS — Z833 Family history of diabetes mellitus: Secondary | ICD-10-CM

## 2016-10-20 DIAGNOSIS — Z7984 Long term (current) use of oral hypoglycemic drugs: Secondary | ICD-10-CM

## 2016-10-20 DIAGNOSIS — K810 Acute cholecystitis: Secondary | ICD-10-CM | POA: Diagnosis present

## 2016-10-20 DIAGNOSIS — Z9861 Coronary angioplasty status: Secondary | ICD-10-CM

## 2016-10-20 DIAGNOSIS — Z79899 Other long term (current) drug therapy: Secondary | ICD-10-CM

## 2016-10-20 DIAGNOSIS — Z87891 Personal history of nicotine dependence: Secondary | ICD-10-CM

## 2016-10-20 DIAGNOSIS — Z7982 Long term (current) use of aspirin: Secondary | ICD-10-CM

## 2016-10-20 DIAGNOSIS — Z8673 Personal history of transient ischemic attack (TIA), and cerebral infarction without residual deficits: Secondary | ICD-10-CM

## 2016-10-20 DIAGNOSIS — A419 Sepsis, unspecified organism: Principal | ICD-10-CM | POA: Diagnosis present

## 2016-10-20 DIAGNOSIS — Z859 Personal history of malignant neoplasm, unspecified: Secondary | ICD-10-CM

## 2016-10-20 DIAGNOSIS — Z794 Long term (current) use of insulin: Secondary | ICD-10-CM

## 2016-10-20 DIAGNOSIS — Z96652 Presence of left artificial knee joint: Secondary | ICD-10-CM | POA: Diagnosis present

## 2016-10-20 LAB — CBC WITH DIFFERENTIAL/PLATELET
BASOS ABS: 0.1 10*3/uL (ref 0–0.1)
BASOS PCT: 1 %
EOS ABS: 0.3 10*3/uL (ref 0–0.7)
EOS PCT: 2 %
HCT: 44.1 % (ref 40.0–52.0)
Hemoglobin: 14.7 g/dL (ref 13.0–18.0)
Lymphocytes Relative: 10 %
Lymphs Abs: 1.6 10*3/uL (ref 1.0–3.6)
MCH: 27.8 pg (ref 26.0–34.0)
MCHC: 33.4 g/dL (ref 32.0–36.0)
MCV: 83 fL (ref 80.0–100.0)
Monocytes Absolute: 1.4 10*3/uL — ABNORMAL HIGH (ref 0.2–1.0)
Monocytes Relative: 8 %
NEUTROS PCT: 79 %
Neutro Abs: 13.3 10*3/uL — ABNORMAL HIGH (ref 1.4–6.5)
PLATELETS: 588 10*3/uL — AB (ref 150–440)
RBC: 5.31 MIL/uL (ref 4.40–5.90)
RDW: 14.5 % (ref 11.5–14.5)
WBC: 16.7 10*3/uL — AB (ref 3.8–10.6)

## 2016-10-20 LAB — COMPREHENSIVE METABOLIC PANEL
ALBUMIN: 3.9 g/dL (ref 3.5–5.0)
ALT: 26 U/L (ref 17–63)
AST: 26 U/L (ref 15–41)
Alkaline Phosphatase: 87 U/L (ref 38–126)
Anion gap: 12 (ref 5–15)
BUN: 44 mg/dL — ABNORMAL HIGH (ref 6–20)
CHLORIDE: 104 mmol/L (ref 101–111)
CO2: 17 mmol/L — AB (ref 22–32)
CREATININE: 1.61 mg/dL — AB (ref 0.61–1.24)
Calcium: 9.6 mg/dL (ref 8.9–10.3)
GFR calc non Af Amer: 38 mL/min — ABNORMAL LOW (ref >60)
GFR, EST AFRICAN AMERICAN: 45 mL/min — AB (ref >60)
GLUCOSE: 217 mg/dL — AB (ref 65–99)
Potassium: 5.1 mmol/L (ref 3.5–5.1)
SODIUM: 133 mmol/L — AB (ref 135–145)
Total Bilirubin: 0.6 mg/dL (ref 0.3–1.2)
Total Protein: 7.8 g/dL (ref 6.5–8.1)

## 2016-10-20 LAB — LIPASE, BLOOD: LIPASE: 59 U/L — AB (ref 11–51)

## 2016-10-20 LAB — PROTIME-INR
INR: 1.23
Prothrombin Time: 15.6 seconds — ABNORMAL HIGH (ref 11.4–15.2)

## 2016-10-20 LAB — TROPONIN I: Troponin I: <0.03 ng/mL (ref <0.03)

## 2016-10-20 LAB — LACTIC ACID, PLASMA: LACTIC ACID, VENOUS: 2.3 mmol/L — AB (ref 0.5–1.9)

## 2016-10-20 MED ORDER — IOPAMIDOL (ISOVUE-300) INJECTION 61%
15.0000 mL | INTRAVENOUS | Status: AC
  Administered 2016-10-21: 15 mL via ORAL

## 2016-10-20 MED ORDER — MORPHINE SULFATE (PF) 2 MG/ML IV SOLN
2.0000 mg | Freq: Once | INTRAVENOUS | Status: AC
  Administered 2016-10-20: 2 mg via INTRAVENOUS

## 2016-10-20 MED ORDER — SODIUM CHLORIDE 0.9 % IV BOLUS (SEPSIS)
1500.0000 mL | Freq: Once | INTRAVENOUS | Status: AC
  Administered 2016-10-21: 1500 mL via INTRAVENOUS

## 2016-10-20 MED ORDER — MORPHINE SULFATE (PF) 2 MG/ML IV SOLN
INTRAVENOUS | Status: AC
  Administered 2016-10-20: 2 mg via INTRAVENOUS
  Filled 2016-10-20: qty 1

## 2016-10-20 MED ORDER — SODIUM CHLORIDE 0.9 % IV BOLUS (SEPSIS)
1000.0000 mL | Freq: Once | INTRAVENOUS | Status: AC
  Administered 2016-10-20: 1000 mL via INTRAVENOUS

## 2016-10-20 MED ORDER — MORPHINE SULFATE (PF) 4 MG/ML IV SOLN
4.0000 mg | Freq: Once | INTRAVENOUS | Status: AC
  Administered 2016-10-20: 4 mg via INTRAVENOUS
  Filled 2016-10-20: qty 1

## 2016-10-20 MED ORDER — PIPERACILLIN-TAZOBACTAM 3.375 G IVPB
3.3750 g | Freq: Once | INTRAVENOUS | Status: AC
  Administered 2016-10-21: 3.375 g via INTRAVENOUS
  Filled 2016-10-20: qty 50

## 2016-10-20 NOTE — ED Triage Notes (Signed)
Pt states that he started having ruq pain today, pt was seen and treated for possible blockage in his bile duct, pt has a drainage tube. Pt states that his pain has become more severe

## 2016-10-20 NOTE — ED Provider Notes (Signed)
[facility] Emergency Department Provider Note   ____________________________________________   First MD Initiated Contact with Patient 10/20/16 2119     (approximate)  I have reviewed the triage vital signs and the nursing notes.   HISTORY  Chief Complaint Abdominal Pain    HPI Henry Lindsey is a 82 y.o. male history of cholecystitis with biliary drainage tube placed recently. Currently on antibiotics including Cipro and Flagyl daily.  Patient reports thatthis afternoon he began experiencing increasing pain in the right upper abdomen near the site of his drainage tube. This tube is continued to drain normally, and he is notany changes. No fever. No nausea. Reports increasing steadily increasing pain and pressure in the right upper abdomen which she reports is currently severe. Torso feels about the same as it did when he was transferred to [facility] and diagnosed with infected gallbladder.  No chest pain or shortness of breath. No pain in lower abdomen. No nausea or vomiting.  Reports he is still passing gas.  Past Medical History:  Diagnosis Date  . BPH (benign prostatic hyperplasia)   . Carotid artery stenosis Feb 2012   <50% bilaterally  . CVA (cerebral infarction) feb 2012   r thalamic lacunar  . Diabetes mellitus   . Hyperlipidemia   . Hypertension   . Neuromuscular disorder ([place])   . Peripheral vascular disease in diabetes mellitus [facility]) Sept 2013    95% occlusion s/p PTCA R SFA [place] Sept 2013    Patient Active Problem List   Diagnosis Date Noted  . Chest pain 10/05/2016  . Cholecystitis 10/05/2016  . Choledocholithiasis   . Myalgia 09/05/2016  . Narcotic drug use 07/02/2016  . Colitis, acute 04/02/2016  . LLQ pain 04/02/2016  . Chronic bronchitis with productive mucopurulent cough ([place]) 08/08/2015  . Pain in joint, ankle and foot 02/25/2015  . Diabetic neuropathy ([place]) 10/09/2014  . BPH with obstruction/lower urinary tract  symptoms 10/09/2014  . Noncompliance with diet and medication regimen 07/09/2014  . Bursitis of hip, right 04/05/2014  . Glaucoma 04/05/2014  . Uncontrolled diabetes mellitus with diabetic nephropathy, with long-term current use of insulin ([place]) 12/28/2013  . Peripheral autonomic neuropathy due to DM ([place]) 12/28/2013  . Overweight (BMI 25.0-29.9) 11/30/2013  . Vitamin D deficiency 11/03/2013  . Impotence due to erectile dysfunction 06/01/2013  . Routine general medical examination at a health care facility 04/04/2013  . CAD (coronary artery disease) 07/21/2012  . Peripheral vascular disease in diabetes mellitus ([place])   . Carotid artery stenosis with cerebral infarction ([place])   . PAD (peripheral artery disease) ([place]) 07/01/2012  . History of basal cell carcinoma excision 08/05/2011  . 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 Type 2 diabetes, uncontrolled, with peripheral circulatory disorder ([place]) 08/05/2011  . Hyperlipidemia   . Hypertension   . CVA (cerebral infarction)     Past Surgical History:  Procedure Laterality Date  . IR GENERIC HISTORICAL  10/06/2016   IR PERC CHOLECYSTOSTOMY 10/06/2016 Aletta Edouard, MD [facility]-INTERV RAD  . JOINT REPLACEMENT Left 2014   left knee  . UPPER GASTROINTESTINAL ENDOSCOPY      Prior to Admission medications   Medication Sig Start Date End Date Taking? Authorizing Provider  Alum & Mag Hydroxide-Simeth (MAGIC MOUTHWASH) SOLN Take 5 mLs by mouth 3 (three) times daily as needed for mouth pain.    Historical Provider, MD  aspirin EC 81 MG tablet Take 1 tablet (81 mg total) by mouth daily. 11/03/13   Aris Everts  Derrel Nip, MD  atorvastatin (LIPITOR) 40 MG tablet TAKE 1 TABLET BY MOUTH EVERY DAY 07/15/16   [name], MD  Blood Glucose Monitoring Suppl (ONE TOUCH ULTRA SYSTEM KIT) w/Device KIT 1 kit by Does not apply route once. Use DX code E11.59 10/18/15   [name], MD  cilostazol (PLETAL) 100 MG tablet TAKE 1 TABLET(100 MG) BY MOUTH TWICE DAILY 08/13/16    [name], MD  ciprofloxacin (CIPRO) 500 MG tablet Take 1 tablet (500 mg total) by mouth 2 (two) times daily. 10/08/16   Orson Eva, MD  cyclobenzaprine (FLEXERIL) 5 MG tablet Take 1 tablet (5 mg total) by mouth 3 (three) times daily as needed for muscle spasms. 09/10/16   [name], MD  ERIVEDGE 150 MG capsule Take 1 tablet by mouth daily. 08/23/16   Historical Provider, MD  famotidine (PEPCID) 20 MG tablet TAKE 1 TABLET(20 MG) BY MOUTH TWICE DAILY 05/08/16   [name], MD  fenofibrate micronized (LOFIBRA) 134 MG capsule TAKE 1 CAPSULE BY MOUTH EVERY MORNING BEFORE BREAKFAST 08/28/16   [name], MD  finasteride (PROSCAR) 5 MG tablet Take 1 tablet (5 mg total) by mouth daily. 05/04/15   [name], MD  gabapentin (NEURONTIN) 400 MG capsule TAKE 1 CAPSULE(400 MG) BY MOUTH THREE TIMES DAILY 09/09/16   [name], MD  glipiZIDE (GLUCOTROL) 10 MG tablet TAKE 1 TABLET(10 MG) BY MOUTH TWICE DAILY 03/27/16   [name], MD  glucose blood test strip Use as instructed three times daily 05/03/16   [name], MD  HYDROcodone-acetaminophen (NORCO/VICODIN) 5-325 MG tablet One tablet two times daily as needed for knee pain 09/03/16   [name], MD  INS SYRINGE/NEEDLE 1CC/28G (B-D INSULIN SYRINGE 1CC/28G) 28G X 1/2" 1 ML MISC USE TO ADMINISTER INSULIN DAILY 02/13/16   [name], MD  insulin lispro protamine-lispro (HUMALOG MIX 75/25) (75-25) 100 UNIT/ML SUSP injection Inject 10 units right before  breakfast and 6 units before dinner 05/03/16   [name], MD  INSULIN SYRINGE .5CC/29G 29G X 1/2" 0.5 ML MISC 1 Syringe by Does not apply route 3 (three) times daily. 08/05/11   [name], MD  Insulin Syringe-Needle U-100 (INSULIN SYRINGE .5CC/31GX5/16") 31G X 5/16" 0.5 ML MISC USE THREE TIMES DAILY 03/18/13   [name], MD  JANUVIA 100 MG tablet TAKE 1 TABLET(100 MG) BY MOUTH DAILY. NOTE DOSE INCREASE 08/13/16   [name], MD  latanoprost (XALATAN) 0.005 %  ophthalmic solution INSTILL 1 DROP INTO BOTH EYES QD 11/01/15   Historical Provider, MD  metFORMIN (GLUCOPHAGE) 1000 MG tablet TAKE 1 TABLET BY MOUTH TWICE DAILY 07/26/16   [name], MD  metoprolol succinate (TOPROL-XL) 100 MG 24 hr tablet TAKE 1 TABLET BY MOUTH ONCE DAILY 09/03/13   [name], MD  metroNIDAZOLE (FLAGYL) 500 MG tablet Take 1 tablet (500 mg total) by mouth every 8 (eight) hours. 10/08/16   Orson Eva, MD  nitroGLYCERIN (NITROSTAT) 0.4 MG SL tablet Place 1 tablet (0.4 mg total) under the tongue every 5 (five) minutes as needed for chest pain. MAXIMUM 3 TABLETS 10/04/16   [name], MD  ondansetron (ZOFRAN) 4 MG tablet Take 1 tablet (4 mg total) by mouth every 8 (eight) hours as needed for nausea or vomiting. 09/03/16   [name], MD  [facility] DELICA LANCETS 87F MISC Use three times daily to check blood sugar. 10/18/15   Aris Everts  Derrel Nip, MD  pantoprazole (PROTONIX) 40 MG tablet Take 1 tablet (40 mg total) by mouth 2 (two) times daily before a meal. 10/04/16   [name], MD  tamsulosin (FLOMAX) 0.4 MG CAPS capsule Take 0.4 mg by mouth daily after breakfast.    Historical Provider, MD  traMADol (ULTRAM) 50 MG tablet Take 50 mg by mouth every 6 (six) hours as needed.  11/24/13   Historical Provider, MD    Allergies Patient has no known allergies.  Family History  Problem Relation Age of Onset  . Diabetes Mother     Social History Social History  Substance Use Topics  . Smoking status: Former Smoker    Quit date: 04/21/1968  . Smokeless tobacco: Never Used     Comment: quit 1969  . Alcohol use No    Review of Systems Constitutional: No fever/chills, feels dehydrated and has not had anything to eat since this afternoon or drink because of pain Eyes: No visual changes. ENT: No sore throat. Cardiovascular: Denies chest pain. Respiratory: Denies shortness of breath. Gastrointestinal: No nausea, no vomiting.  No diarrhea.  No constipation. Genitourinary:  Negative for dysuria. Musculoskeletal: Negative for back pain. Skin: Negative for rash. Neurological: Negative for headaches, focal weakness or numbness.  10-point ROS otherwise negative.  ____________________________________________   PHYSICAL EXAM:  VITAL SIGNS: ED Triage Vitals  Enc Vitals Group     BP 10/20/16 2101 125/65     Pulse Rate 10/20/16 2101 (!) 131     Resp 10/20/16 2101 (!) 22     Temp 10/20/16 2101 97.5 F (36.4 C)     Temp src --      SpO2 10/20/16 2101 94 %     Weight 10/20/16 2103 170 lb (77.1 kg)     Height 10/20/16 2103 5' 9" (1.753 m)     Head Circumference --      Peak Flow --      Pain Score 10/20/16 2104 10     Pain Loc --      Pain Edu? --      Excl. in [place]? --     Constitutional: Alert and oriented. Well appearing and in no acute distressExcept appearing in moderate discomfort noting pain in his right upper abdomen. Eyes: Conjunctivae are normal. PERRL. EOMI. Head: Atraumatic. Nose: No congestion/rhinnorhea. Mouth/Throat: Mucous membranes are moist.  Oropharynx non-erythematous. Neck: No stridor.   Cardiovascular: Normal rate, regular rhythm. Grossly normal heart sounds.  Good peripheral circulation. Respiratory: Normal respiratory effort.  No retractions. Lungs CTAB. Gastrointestinal: Soft and nontender except for focal pain in the right upper quadrant, cholecystostomy tube is noted and appears to be draining biliary fluid without evidence of purulence. Patient has focal tenderness in right upper quadrant  Musculoskeletal: No lower extremity tenderness nor edema.   Neurologic:  Normal speech and language. No gross focal neurologic deficits are appreciated.  Skin:  Skin is warm, dry and intact. No rash noted. Psychiatric: Mood and affect are normal. Speech and behavior are normal.  ____________________________________________   LABS (all labs ordered are listed, but only abnormal results are displayed)  Labs Reviewed  CBC WITH  DIFFERENTIAL/PLATELET - Abnormal; Notable for the following:       Result Value   WBC 16.7 (*)    Platelets 588 (*)    Neutro Abs 13.3 (*)    Monocytes Absolute 1.4 (*)    All other components within normal limits  COMPREHENSIVE METABOLIC PANEL - Abnormal; Notable for the following:  Sodium 133 (*)    CO2 17 (*)    Glucose, Bld 217 (*)    BUN 44 (*)    Creatinine, Ser 1.61 (*)    GFR calc non Af Amer 38 (*)    GFR calc Af Amer 45 (*)    All other components within normal limits  LACTIC ACID, PLASMA - Abnormal; Notable for the following:    Lactic Acid, Venous 2.3 (*)    All other components within normal limits  LIPASE, BLOOD - Abnormal; Notable for the following:    Lipase 59 (*)    All other components within normal limits  PROTIME-INR - Abnormal; Notable for the following:    Prothrombin Time 15.6 (*)    All other components within normal limits  CULTURE, BLOOD (ROUTINE X 2)  CULTURE, BLOOD (ROUTINE X 2)  URINE CULTURE  TROPONIN I  LACTIC ACID, PLASMA  URINALYSIS, COMPLETE (UACMP) WITH MICROSCOPIC   ____________________________________________  EKG  Reviewed and interpreted by me at 2216  Sinus tachycardia, normal axis Probable old inferior MI with Q waves inferiorly, nonspecific T-wave abnormality noted in V5 V6, no acute ST elevation noted compared with the patient's previous EKG from December 16 NO significant changes noted at this time. ____________________________________________  RADIOLOGY   ____________________________________________   PROCEDURES  Procedure(s) performed: None  Procedures  Critical Care performed: No  ____________________________________________   INITIAL IMPRESSION / ASSESSMENT AND PLAN / ED COURSE  Pertinent labs & imaging results that were available during my care of the patient were reviewed by me and considered in my medical decision making (see chart for details).  Differential diagnosis includes but is not limited to,  abdominal perforation, aortic dissection, cholecystitis, appendicitis, diverticulitis, colitis, esophagitis/gastritis, kidney stone, pyelonephritis, urinary tract infection, aortic aneurysm. All are considered in decision and treatment plan. Based upon the patient's presentation and risk factors, concerning patient has developed complications from his cholecystitis. His cholecystostomy tube from clinical exam appears to be working properly and there is no evidence of erythema or surrounding evidence of cellulitis or superinfection however he is definitely tender in that region, recent cholecystitis no elevated white count, focal right upper quadrant abdominal pain and the patient may be failing outpatient therapy. No cardiac or pulmonary symptoms.   ----------------------------------------- 10:51 PM on 10/20/2016 -----------------------------------------  Patient reports his pain is improving, down to about a 5. He is resting more comfortably and I will give him a small additional dose of morphine. I have paged the on-call at [facility] surgery to discuss his presentation today, of note he does have tachycardia, leukocytosis, and a recent operative procedure highly suspicious for possible bacteremia versus worsening cholecystitis/biliary pathology.  Clinical Course    ED Sepsis - Repeat Assessment   Performed at:    12:15 AM  Last Vitals:    Blood pressure 125/65, pulse (!) 131, temperature 97.5 F (36.4 C), resp. rate (!) 22, height 5' 9" (1.753 m), weight 170 lb (77.1 kg), SpO2 94 %.  Heart:      Clear tones, mild tachycardia rate 110  Lungs:     Clear bilaterally  Capillary Refill:   Normal less than 1 second  Peripheral Pulse (include location): Right radial   Skin (include color):   Normal, pink warm  ----------------------------------------- 12:33 AM on 10/21/2016 -----------------------------------------  Case was assessed with Dr. Hassell Done, of [facility] who  advises obtaining a CT abdomen and pelvis with contrast. Also recommends admission, likely on the medical service with [facility] and is  concerned that the patient may be having worsening symptoms of recurrent cholecystitis or right upper quadrant infection. I would agree with this plan, [facility] Doctor Dahlia Client with the plan for the patient be admitted but CT pending, if concern for a surgical process consult general surgery here at [facility] regional advised.  Patient himself reports pain is improved, resting comfortably understands plan for admission as well as CT scan to evaluate for possible [place] occasional worsening infection involving his gallbladder which is my working diagnosis at present. ____________________________________________   FINAL CLINICAL IMPRESSION(S) / ED DIAGNOSES  Final diagnoses:  Sepsis, due to unspecified organism ([place])  Right upper quadrant abdominal pain      NEW MEDICATIONS STARTED DURING THIS VISIT:  New Prescriptions   No medications on file     Note:  This document was prepared using Dragon voice recognition software and may include unintentional dictation errors.     Delman Kitten, MD 10/21/16 [id number]

## 2016-10-20 NOTE — ED Notes (Signed)
Pt with right upper quadrant drainage bag in place with secure device . Located in Gallbladder, bile secretion present in drain. Tenderness to area

## 2016-10-21 ENCOUNTER — Emergency Department: Payer: Medicare Other

## 2016-10-21 ENCOUNTER — Encounter: Payer: Self-pay | Admitting: Radiology

## 2016-10-21 DIAGNOSIS — K810 Acute cholecystitis: Secondary | ICD-10-CM | POA: Diagnosis present

## 2016-10-21 DIAGNOSIS — E1151 Type 2 diabetes mellitus with diabetic peripheral angiopathy without gangrene: Secondary | ICD-10-CM | POA: Diagnosis present

## 2016-10-21 DIAGNOSIS — R000 Tachycardia, unspecified: Secondary | ICD-10-CM | POA: Diagnosis present

## 2016-10-21 DIAGNOSIS — Z87891 Personal history of nicotine dependence: Secondary | ICD-10-CM | POA: Diagnosis not present

## 2016-10-21 DIAGNOSIS — I10 Essential (primary) hypertension: Secondary | ICD-10-CM | POA: Diagnosis present

## 2016-10-21 DIAGNOSIS — Z9861 Coronary angioplasty status: Secondary | ICD-10-CM | POA: Diagnosis not present

## 2016-10-21 DIAGNOSIS — A419 Sepsis, unspecified organism: Secondary | ICD-10-CM | POA: Diagnosis present

## 2016-10-21 DIAGNOSIS — R1011 Right upper quadrant pain: Secondary | ICD-10-CM

## 2016-10-21 DIAGNOSIS — Z833 Family history of diabetes mellitus: Secondary | ICD-10-CM | POA: Diagnosis not present

## 2016-10-21 DIAGNOSIS — Z79899 Other long term (current) drug therapy: Secondary | ICD-10-CM | POA: Diagnosis not present

## 2016-10-21 DIAGNOSIS — Z7984 Long term (current) use of oral hypoglycemic drugs: Secondary | ICD-10-CM | POA: Diagnosis not present

## 2016-10-21 DIAGNOSIS — N400 Enlarged prostate without lower urinary tract symptoms: Secondary | ICD-10-CM | POA: Diagnosis present

## 2016-10-21 DIAGNOSIS — Z9889 Other specified postprocedural states: Secondary | ICD-10-CM | POA: Diagnosis not present

## 2016-10-21 DIAGNOSIS — Z96652 Presence of left artificial knee joint: Secondary | ICD-10-CM | POA: Diagnosis present

## 2016-10-21 DIAGNOSIS — Z794 Long term (current) use of insulin: Secondary | ICD-10-CM | POA: Diagnosis not present

## 2016-10-21 DIAGNOSIS — Z8673 Personal history of transient ischemic attack (TIA), and cerebral infarction without residual deficits: Secondary | ICD-10-CM | POA: Diagnosis not present

## 2016-10-21 DIAGNOSIS — Z7982 Long term (current) use of aspirin: Secondary | ICD-10-CM | POA: Diagnosis not present

## 2016-10-21 DIAGNOSIS — Z859 Personal history of malignant neoplasm, unspecified: Secondary | ICD-10-CM | POA: Diagnosis not present

## 2016-10-21 LAB — COMPREHENSIVE METABOLIC PANEL
ALT: 18 U/L (ref 17–63)
AST: 19 U/L (ref 15–41)
Albumin: 3.1 g/dL — ABNORMAL LOW (ref 3.5–5.0)
Alkaline Phosphatase: 62 U/L (ref 38–126)
Anion gap: 4 — ABNORMAL LOW (ref 5–15)
BUN: 22 mg/dL — AB (ref 6–20)
CHLORIDE: 111 mmol/L (ref 101–111)
CO2: 21 mmol/L — ABNORMAL LOW (ref 22–32)
CREATININE: 1.11 mg/dL (ref 0.61–1.24)
Calcium: 7.9 mg/dL — ABNORMAL LOW (ref 8.9–10.3)
GFR calc Af Amer: >60 mL/min (ref >60)
Glucose, Bld: 149 mg/dL — ABNORMAL HIGH (ref 65–99)
Potassium: 3.7 mmol/L (ref 3.5–5.1)
Sodium: 136 mmol/L (ref 135–145)
Total Bilirubin: 0.6 mg/dL (ref 0.3–1.2)
Total Protein: 6.3 g/dL — ABNORMAL LOW (ref 6.5–8.1)

## 2016-10-21 LAB — URINALYSIS, COMPLETE (UACMP) WITH MICROSCOPIC
BACTERIA UA: NONE SEEN
Bilirubin Urine: NEGATIVE
GLUCOSE, UA: 150 mg/dL — AB
HGB URINE DIPSTICK: NEGATIVE
KETONES UR: NEGATIVE mg/dL
LEUKOCYTES UA: NEGATIVE
Nitrite: NEGATIVE
PROTEIN: 30 mg/dL — AB
Specific Gravity, Urine: 1.024 (ref 1.005–1.030)
Squamous Epithelial / HPF: NONE SEEN
pH: 5 (ref 5.0–8.0)

## 2016-10-21 LAB — GLUCOSE, CAPILLARY
Glucose-Capillary: 131 mg/dL — ABNORMAL HIGH (ref 65–99)
Glucose-Capillary: 136 mg/dL — ABNORMAL HIGH (ref 65–99)
Glucose-Capillary: 172 mg/dL — ABNORMAL HIGH (ref 65–99)
Glucose-Capillary: 116 mg/dL — ABNORMAL HIGH (ref 65–99)

## 2016-10-21 LAB — LACTIC ACID, PLASMA: Lactic Acid, Venous: 1.5 mmol/L (ref 0.5–1.9)

## 2016-10-21 LAB — TSH: TSH: 2.943 u[IU]/mL (ref 0.350–4.500)

## 2016-10-21 MED ORDER — MAGIC MOUTHWASH
5.0000 mL | Freq: Three times a day (TID) | ORAL | Status: DC | PRN
  Filled 2016-10-21: qty 5

## 2016-10-21 MED ORDER — ACETAMINOPHEN 650 MG RE SUPP: 650.0000 mg | Freq: Four times a day (QID) | RECTAL | Status: DC | PRN

## 2016-10-21 MED ORDER — PANTOPRAZOLE SODIUM 40 MG PO TBEC
40.0000 mg | DELAYED_RELEASE_TABLET | Freq: Two times a day (BID) | ORAL | Status: DC
  Administered 2016-10-21 – 2016-10-23 (×5): 40 mg via ORAL
  Filled 2016-10-21 (×5): qty 1

## 2016-10-21 MED ORDER — ACETAMINOPHEN 325 MG PO TABS: 650.0000 mg | ORAL_TABLET | Freq: Four times a day (QID) | ORAL | Status: DC | PRN

## 2016-10-21 MED ORDER — FINASTERIDE 5 MG PO TABS
5.0000 mg | ORAL_TABLET | Freq: Every day | ORAL | Status: DC
  Administered 2016-10-21 – 2016-10-23 (×3): 5 mg via ORAL
  Filled 2016-10-21 (×3): qty 1

## 2016-10-21 MED ORDER — TAMSULOSIN HCL 0.4 MG PO CAPS
0.4000 mg | ORAL_CAPSULE | Freq: Every day | ORAL | Status: DC
  Administered 2016-10-21 – 2016-10-23 (×3): 0.4 mg via ORAL
  Filled 2016-10-21 (×3): qty 1

## 2016-10-21 MED ORDER — TRAMADOL HCL 50 MG PO TABS: 50.0000 mg | ORAL_TABLET | Freq: Four times a day (QID) | ORAL | Status: DC | PRN

## 2016-10-21 MED ORDER — ONDANSETRON HCL 4 MG PO TABS: 4.0000 mg | ORAL_TABLET | Freq: Four times a day (QID) | ORAL | Status: DC | PRN

## 2016-10-21 MED ORDER — CYCLOBENZAPRINE HCL 10 MG PO TABS: 5.0000 mg | ORAL_TABLET | Freq: Three times a day (TID) | ORAL | Status: DC | PRN

## 2016-10-21 MED ORDER — LATANOPROST 0.005 % OP SOLN
1.0000 [drp] | Freq: Every day | OPHTHALMIC | Status: DC
  Administered 2016-10-22: 1 [drp] via OPHTHALMIC
  Filled 2016-10-21: qty 2.5

## 2016-10-21 MED ORDER — ASPIRIN EC 81 MG PO TBEC
81.0000 mg | DELAYED_RELEASE_TABLET | Freq: Every day | ORAL | Status: DC
  Administered 2016-10-21 – 2016-10-23 (×3): 81 mg via ORAL
  Filled 2016-10-21 (×3): qty 1

## 2016-10-21 MED ORDER — VISMODEGIB 150 MG PO CAPS: 150.0000 mg | ORAL_CAPSULE | Freq: Every day | ORAL | Status: DC

## 2016-10-21 MED ORDER — GABAPENTIN 400 MG PO CAPS
400.0000 mg | ORAL_CAPSULE | Freq: Three times a day (TID) | ORAL | Status: DC
  Administered 2016-10-21 – 2016-10-23 (×7): 400 mg via ORAL
  Filled 2016-10-21 (×7): qty 1

## 2016-10-21 MED ORDER — METOPROLOL SUCCINATE ER 100 MG PO TB24
100.0000 mg | ORAL_TABLET | Freq: Every day | ORAL | Status: DC
  Administered 2016-10-21 – 2016-10-23 (×3): 100 mg via ORAL
  Filled 2016-10-21 (×3): qty 1

## 2016-10-21 MED ORDER — IOPAMIDOL (ISOVUE-300) INJECTION 61%
75.0000 mL | Freq: Once | INTRAVENOUS | Status: AC | PRN
  Administered 2016-10-21: 75 mL via INTRAVENOUS

## 2016-10-21 MED ORDER — INSULIN GLARGINE 100 UNIT/ML ~~LOC~~ SOLN
10.0000 [IU] | Freq: Every day | SUBCUTANEOUS | Status: DC
  Administered 2016-10-22: 10 [IU] via SUBCUTANEOUS
  Filled 2016-10-21 (×4): qty 0.1

## 2016-10-21 MED ORDER — FENOFIBRATE 160 MG PO TABS
160.0000 mg | ORAL_TABLET | Freq: Every day | ORAL | Status: DC
  Administered 2016-10-21 – 2016-10-23 (×3): 160 mg via ORAL
  Filled 2016-10-21 (×3): qty 1

## 2016-10-21 MED ORDER — PIPERACILLIN-TAZOBACTAM 3.375 G IVPB
3.3750 g | Freq: Three times a day (TID) | INTRAVENOUS | Status: DC
  Administered 2016-10-21 – 2016-10-22 (×4): 3.375 g via INTRAVENOUS
  Filled 2016-10-21 (×4): qty 50

## 2016-10-21 MED ORDER — PIPERACILLIN-TAZOBACTAM 4.5 G IVPB
4.5000 g | Freq: Three times a day (TID) | INTRAVENOUS | Status: DC
  Administered 2016-10-21: 4.5 g via INTRAVENOUS
  Filled 2016-10-21 (×3): qty 100

## 2016-10-21 MED ORDER — SODIUM CHLORIDE 0.9% FLUSH
3.0000 mL | Freq: Two times a day (BID) | INTRAVENOUS | Status: DC
  Administered 2016-10-21 – 2016-10-23 (×6): 3 mL via INTRAVENOUS

## 2016-10-21 MED ORDER — HEPARIN SODIUM (PORCINE) 5000 UNIT/ML IJ SOLN
5000.0000 [IU] | Freq: Three times a day (TID) | INTRAMUSCULAR | Status: DC
  Administered 2016-10-21 – 2016-10-23 (×7): 5000 [IU] via SUBCUTANEOUS
  Filled 2016-10-21 (×7): qty 1

## 2016-10-21 MED ORDER — SODIUM CHLORIDE 0.9 % IV SOLN
INTRAVENOUS | Status: DC
  Administered 2016-10-21 – 2016-10-22 (×6): via INTRAVENOUS

## 2016-10-21 MED ORDER — DOCUSATE SODIUM 100 MG PO CAPS
100.0000 mg | ORAL_CAPSULE | Freq: Two times a day (BID) | ORAL | Status: DC
  Administered 2016-10-22 – 2016-10-23 (×3): 100 mg via ORAL
  Filled 2016-10-21 (×5): qty 1

## 2016-10-21 MED ORDER — POLYETHYLENE GLYCOL 3350 17 G PO PACK
17.0000 g | PACK | Freq: Every day | ORAL | Status: DC
  Administered 2016-10-21: 17 g via ORAL
  Filled 2016-10-21 (×2): qty 1

## 2016-10-21 MED ORDER — ATORVASTATIN CALCIUM 20 MG PO TABS
40.0000 mg | ORAL_TABLET | Freq: Every day | ORAL | Status: DC
  Administered 2016-10-21 – 2016-10-23 (×3): 40 mg via ORAL
  Filled 2016-10-21: qty 1
  Filled 2016-10-21 (×3): qty 2

## 2016-10-21 MED ORDER — LINAGLIPTIN 5 MG PO TABS
5.0000 mg | ORAL_TABLET | Freq: Every day | ORAL | Status: DC
Start: 2016-10-21 — End: 2016-10-23
  Administered 2016-10-21 – 2016-10-23 (×3): 5 mg via ORAL
  Filled 2016-10-21 (×3): qty 1

## 2016-10-21 MED ORDER — CILOSTAZOL 100 MG PO TABS
100.0000 mg | ORAL_TABLET | Freq: Two times a day (BID) | ORAL | Status: DC
  Administered 2016-10-21 – 2016-10-23 (×5): 100 mg via ORAL
  Filled 2016-10-21 (×5): qty 1

## 2016-10-21 MED ORDER — HYDROCODONE-ACETAMINOPHEN 5-325 MG PO TABS
1.0000 | ORAL_TABLET | ORAL | Status: DC | PRN
  Administered 2016-10-21 – 2016-10-22 (×2): 1 via ORAL
  Filled 2016-10-21 (×2): qty 1

## 2016-10-21 MED ORDER — MORPHINE SULFATE (PF) 4 MG/ML IV SOLN
2.0000 mg | INTRAVENOUS | Status: DC | PRN
  Administered 2016-10-21 – 2016-10-22 (×3): 2 mg via INTRAVENOUS
  Filled 2016-10-21 (×3): qty 1

## 2016-10-21 MED ORDER — ONDANSETRON HCL 4 MG/2ML IJ SOLN: 4.0000 mg | Freq: Four times a day (QID) | INTRAMUSCULAR | Status: DC | PRN

## 2016-10-21 MED ORDER — NITROGLYCERIN 0.4 MG SL SUBL: 0.4000 mg | SUBLINGUAL_TABLET | SUBLINGUAL | Status: DC | PRN

## 2016-10-21 MED ORDER — INSULIN ASPART 100 UNIT/ML ~~LOC~~ SOLN
0.0000 [IU] | Freq: Three times a day (TID) | SUBCUTANEOUS | Status: DC
  Administered 2016-10-21: 3 [IU] via SUBCUTANEOUS
  Administered 2016-10-21 – 2016-10-22 (×3): 2 [IU] via SUBCUTANEOUS
  Administered 2016-10-22 – 2016-10-23 (×4): 3 [IU] via SUBCUTANEOUS
  Filled 2016-10-21 (×3): qty 3
  Filled 2016-10-21: qty 2
  Filled 2016-10-21: qty 3
  Filled 2016-10-21: qty 2
  Filled 2016-10-21: qty 3
  Filled 2016-10-21: qty 2

## 2016-10-21 NOTE — Progress Notes (Signed)
Pharmacy Antibiotic Note  Henry Lindsey is a 83 y.o. male admitted on 10/20/2016 with sepsis.  Pharmacy has been consulted for piperacillin/tazobactam dosing.  Plan: Piperacillin/tazobactam 3.375 g IV q8h EI  Height: 5\' 11"  (180.3 cm) Weight: 177 lb 6.4 oz (80.5 kg) IBW/kg (Calculated) : 75.3  Temp (24hrs), Avg:97.8 F (36.6 C), Min:97.5 F (36.4 C), Max:97.9 F (36.6 C)   Recent Labs Lab 10/20/16 2159 10/21/16 0036  WBC 16.7*  --   CREATININE 1.61*  --   LATICACIDVEN 2.3* 1.5    Estimated Creatinine Clearance: 38.3 mL/min (by C-G formula based on SCr of 1.61 mg/dL (H)).    No Known Allergies Thank you for allowing pharmacy to be a part of this patient's care.  Henry Lindsey Henry Lindsey 10/21/2016 11:14 AM

## 2016-10-21 NOTE — ED Notes (Signed)
Pt transported to room 202.

## 2016-10-21 NOTE — ED Provider Notes (Signed)
-----------------------------------------   1:57 AM on 10/21/2016 -----------------------------------------   Blood pressure 128/60, pulse (!) 103, temperature 97.5 F (36.4 C), resp. rate 16, height 5\' 9"  (1.753 m), weight 170 lb (77.1 kg), SpO2 93 %.  Assuming care from Dr. Jacqualine Code.  In short, Henry Lindsey is a 83 y.o. male with a chief complaint of Abdominal Pain .  Refer to the original H&P for additional details.  The current plan of care is to follow-up the results of the CT scan and disposition the patient.   CT abd and pelvis: 1. Right upper quadrant drainage catheter present within the gallbladder fossa. The gallbladder is decompressed. Mild residual edema and soft tissue stranding in the right upper quadrant and surrounding the catheter suggests mild residual inflammatory changes. Previously noted intra hepatic biliary dilatation has resolved. Small calcifications that were previously noted in the region of the common bile duct at the head of the pancreas are not identified on the current exam. 2. Sigmoid colon diverticular disease without acute inflammation  I contacted the hospitalist to admit the patient for medical management as had been suggested by the physician at [facility]. The patient will be admitted to the hospitalist service.   Loney Hering, MD 10/21/16 [phone]

## 2016-10-21 NOTE — Progress Notes (Signed)
Patient said MD encouraged him to take morphine when it is needed.  Morphine is not ordered at this time.  Dr Margaretmary Eddy notified and gave order for morphine

## 2016-10-21 NOTE — Progress Notes (Signed)
Order given to discontinue telemetry

## 2016-10-21 NOTE — Progress Notes (Signed)
[place] [facility] [place] NAME: Henry Lindsey    MR#:  [id number]  DATE OF BIRTH:  [birth year]  SUBJECTIVE:  CHIEF COMPLAINT:  Right abdominal pain is tolerable with the current pain medicines. Wife at bedside with the ciprofloxacin and Flagyl during his previous admission at [facility] from 12/16 to 10/08/2016  REVIEW OF SYSTEMS:  CONSTITUTIONAL: No fever, fatigue or weakness.  EYES: No blurred or double vision.  EARS, NOSE, AND THROAT: No tinnitus or ear pain.  RESPIRATORY: No cough, shortness of breath, wheezing or hemoptysis.  CARDIOVASCULAR: No chest pain, orthopnea, edema.  GASTROINTESTINAL: No nausea, vomiting, diarrhea . Reports right-sided abdominal pain GENITOURINARY: No dysuria, hematuria.  ENDOCRINE: No polyuria, nocturia,  HEMATOLOGY: No anemia, easy bruising or bleeding SKIN: No rash or lesion. MUSCULOSKELETAL: No joint pain or arthritis.   NEUROLOGIC: No tingling, numbness, weakness.  PSYCHIATRY: No anxiety or depression.   DRUG ALLERGIES:  No Known Allergies  VITALS:  Blood pressure 111/60, pulse 72, temperature 98.3 F (36.8 C), temperature source Oral, resp. rate 20, height 5\' 11"  (1.803 m), weight 80.5 kg (177 lb 6.4 oz), SpO2 93 %.  PHYSICAL EXAMINATION:  GENERAL:  83 y.o.-year-old patient lying in the bed with no acute distress.  EYES: Pupils equal, round, reactive to light and accommodation. No scleral icterus. Extraocular muscles intact.  HEENT: Head atraumatic, normocephalic. Oropharynx and nasopharynx clear.  NECK:  Supple, no jugular venous distention. No thyroid enlargement, no tenderness.  LUNGS: Normal breath sounds bilaterally, no wheezing, rales,rhonchi or crepitation. No use of accessory muscles of respiration.  CARDIOVASCULAR: S1, S2 normal. No murmurs, rubs, or gallops.  ABDOMEN: Soft, right upper quadrant tenderness, no rebound tenderness, right upper quadrant with intact drain, with brown fluid in  the bag nondistended. Bowel sounds present. No organomegaly or mass.  EXTREMITIES: No pedal edema, cyanosis, or clubbing.  NEUROLOGIC: Cranial nerves II through XII are intact. Muscle strength 5/5 in all extremities. Sensation intact. Gait not checked.  PSYCHIATRIC: The patient is alert and oriented x 3.  SKIN: No obvious rash, lesion, or ulcer.    LABORATORY PANEL:   CBC  Recent Labs Lab 10/20/16 2159  WBC 16.7*  HGB 14.7  HCT 44.1  PLT 588*   ------------------------------------------------------------------------------------------------------------------  Chemistries   Recent Labs Lab 10/20/16 2159  NA 133*  K 5.1  CL 104  CO2 17*  GLUCOSE 217*  BUN 44*  CREATININE 1.61*  CALCIUM 9.6  AST 26  ALT 26  ALKPHOS 87  BILITOT 0.6   ------------------------------------------------------------------------------------------------------------------  Cardiac Enzymes  Recent Labs Lab 10/20/16 2159  TROPONINI <0.03   ------------------------------------------------------------------------------------------------------------------  RADIOLOGY:  Dg Abdomen 1 View  Result Date: 10/20/2016 CLINICAL DATA:  Right upper quadrant pain EXAM: ABDOMEN - 1 VIEW COMPARISON:  MRI 10/07/2016, 10/06/2016, CT 10/05/2016 FINDINGS: There is a nonobstructed bowel gas pattern. There is a drainage catheter in the right upper quadrant of the abdomen. No pathologic calcifications are visualized. IMPRESSION: Nonobstructed bowel-gas pattern. Drainage catheter present in the right upper quadrant. Electronically Signed   By: Donavan Foil M.D.   On: 10/20/2016 22:33   Ct Abdomen Pelvis W Contrast  Result Date: 10/21/2016 CLINICAL DATA:  Right upper quadrant pain, status post cholecystostomy EXAM: CT ABDOMEN AND PELVIS WITH CONTRAST TECHNIQUE: Multidetector CT imaging of the abdomen and pelvis was performed using the standard protocol following bolus administration of intravenous contrast. CONTRAST:   52mL ISOVUE-300 IOPAMIDOL (ISOVUE-300) INJECTION 61%, 1 ISOVUE-300 IOPAMIDOL (ISOVUE-300) INJECTION 61% COMPARISON:  Radiograph 10/20/2016, CT 10/05/2016, 04/10/2016, MRI 12 18,017 FINDINGS: Lower chest: Linear scar or atelectasis in the right lower lobe with calcified granuloma present. No acute infiltrate or pleural effusion is visualized. Coronary artery calcifications are present. Heart size within normal limits. No pericardial effusion. Hepatobiliary: Diffuse fatty infiltration of the liver. Previously noted mild intra hepatic biliary dilatation is no longer identified. Interim placement of a drainage catheter within the gallbladder fossa with decompression of the gallbladder. There is mild edema and inflammatory change around the catheter tract and the decompressed gallbladder. The previously noted small stones within the distal common bowel duct on the prior CT are not confidently identified on the current exam. There are other scattered calcifications at the pancreas which may be vascular in the etiology or possibly related to chronic pancreatitis. Pancreas: No peripancreatic inflammation is present. Spleen: Normal in size without focal abnormality. Adrenals/Urinary Tract: Adrenal glands are within normal limits. Kidneys show no hydronephrosis. Multiple hypodense renal lesions as before. Bladder unremarkable. Stomach/Bowel: Stomach is nonenlarged. No dilated small bowel. No colon wall thickening. Sigmoid diverticular disease without acute inflammation. Vascular/Lymphatic: Densely calcified aorta. No significantly enlarged lymph nodes. Reproductive: Prostate gland is slightly enlarged. Other: No free air or free fluid. Musculoskeletal: Stable skeletal structures with marked multilevel degenerative changes of the spine as well as anterolisthesis of L4 on L5. Lucent inferior endplate lesion in L5 is unchanged. IMPRESSION: 1. Right upper quadrant drainage catheter present within the gallbladder fossa. The  gallbladder is decompressed. Mild residual edema and soft tissue stranding in the right upper quadrant and surrounding the catheter suggests mild residual inflammatory changes. Previously noted intra hepatic biliary dilatation has resolved. Small calcifications that were previously noted in the region of the common bile duct at the head of the pancreas are not identified on the current exam. 2. Sigmoid colon diverticular disease without acute inflammation Electronically Signed   By: Donavan Foil M.D.   On: 10/21/2016 01:32    EKG:   Orders placed or performed during the hospital encounter of 10/20/16  . ED EKG  . ED EKG  . EKG 12-Lead  . EKG 12-Lead  . EKG 12-Lead  . EKG 12-Lead  . EKG 12-Lead  . EKG 12-Lead  . EKG 12-Lead  . EKG 12-Lead  . EKG 12-Lead  . EKG 12-Lead  . EKG 12-Lead  . EKG 12-Lead    ASSESSMENT AND PLAN:    This is a 83 year old male admitted for sepsis.  1. Sepsis: The patient meets criteria via tachycardia and leukocytosis. He is hemodynamically stable. Continue Zosyn. Blood cultures no growth in less than 12 hours  urine culture pending  2. Acute right upper quadrant abdominal pain secondary to acute cholecystitis: Status post cholecystostomy tube 2 weeks ago and status post antibiotics Cipro and Flagyl treatment. Completed the course Clinically Improving; gallbladder decompressed with only residual evidence of inflammation. Surgery is recommending no new procedures at this time. Follow-up with surgery Continue IV antibiotics and low-fat diet MiraLAX for stool burden in the colon-revealed on CT scan Pain management as needed  3. Diabetes mellitus type 2: Hold mixed insulin as well as oral hypoglycemic agents; continue basal insulin as well as sliding scale.  4. Essential hypertension: Controlled; continue metoprolol  5. BPH: Continue Flomax and finasteride  6. DVT prophylaxis: Heparin  7. GI prophylaxis: Pantoprazole per home regimen     All the  records are reviewed and case discussed with Care Management/Social Workerr. Management plans discussed with the patient, family and they  are in agreement.  CODE STATUS: fc  TOTAL TIME TAKING CARE OF THIS PATIENT: 36  minutes.   POSSIBLE D/C IN 2 -3 DAYS, DEPENDING ON CLINICAL CONDITION.  Note: This dictation was prepared with Dragon dictation along with smaller phrase technology. Any transcriptional errors that result from this process are unintentional.   Nicholes Mango M.D on 10/21/2016 at 4:55 PM  Between 7am to 6pm - Pager - [phone] After 6pm go to [email or web address] - password [email or web address] [facility]  Office  [phone]  CC: Primary care physician; [name], MD

## 2016-10-21 NOTE — Progress Notes (Signed)
Pharmacy Antibiotic Note  Henry Lindsey is a 83 y.o. male admitted on 10/20/2016 with sepsis.  Pharmacy has been consulted for Zosyn dosing.  Plan:. Zosyn 4.5 grams q 8 hours ordered for Pseudomonas risk of antibiotics received in last 3 months.  Height: 5\' 9"  (175.3 cm) Weight: 170 lb (77.1 kg) IBW/kg (Calculated) : 70.7  Temp (24hrs), Avg:97.7 F (36.5 C), Min:97.5 F (36.4 C), Max:97.8 F (36.6 C)   Recent Labs Lab 10/20/16 2159 10/21/16 0036  WBC 16.7*  --   CREATININE 1.61*  --   LATICACIDVEN 2.3* 1.5    Estimated Creatinine Clearance: 36 mL/min (by C-G formula based on SCr of 1.61 mg/dL (H)).    No Known Allergies  Antimicrobials this admission: Zosyn  >>    >>   Dose adjustments this admission:   Microbiology results: 1/1 BCx: pending 1/1 UCx: pending     1/1 UA: (-)  Thank you for allowing pharmacy to be a part of this patient's care.  Thora Scherman S 10/21/2016 3:32 AM

## 2016-10-21 NOTE — Care Management (Signed)
It appears that patient was referred to [facility] after discharge from [facility] on 12/19 with cholecystotomy tube. During that stay had MRCP which was negative for stone in the common bile duct.  He is to have a cholecystectomy in approximately 6 weeks.  Independent in all adls, denies issues accessing medical care, obtaining medications or with transportation.  Current with  PCP. Reached out to Garc[place] with [facility] to inquire if patient is still open

## 2016-10-21 NOTE — H&P (Signed)
Henry Lindsey is an 83 y.o. male.   Chief Complaint: Abdominal pain HPI: The patient with past medical history of recent cholecystitis presents emergency department complaining of abdominal pain. The patient states that his right upper quadrant is exquisitely tender. He had a percutaneous drain placed a few weeks ago. Since that time he has complained of subjective fever as well as decreased appetite and intermittent nausea. In the emergency department the patient was found to meet septic criteria. After consultation with his surgeons at [facility] he would not to require surgical intervention which prompted the emergency department staff to call the hospitalist service for admission.  Past Medical History:  Diagnosis Date  . BPH (benign prostatic hyperplasia)   . Cancer ([place])   . Carotid artery stenosis Feb 2012   <50% bilaterally  . CVA (cerebral infarction) feb 2012   r thalamic lacunar  . Diabetes mellitus   . Hyperlipidemia   . Hypertension   . Neuromuscular disorder ([place])   . Peripheral vascular disease in diabetes mellitus [facility]) Sept 2013    95% occlusion s/p PTCA R SFA Dew Sept 2013    Past Surgical History:  Procedure Laterality Date  . IR GENERIC HISTORICAL  10/06/2016   IR PERC CHOLECYSTOSTOMY 10/06/2016 Aletta Edouard, MD [facility]-INTERV RAD  . JOINT REPLACEMENT Left 2014   left knee  . UPPER GASTROINTESTINAL ENDOSCOPY      Family History  Problem Relation Age of Onset  . Diabetes Mother    Social History:  reports that he quit smoking about 48 years ago. He has never used smokeless tobacco. He reports that he does not drink alcohol or use drugs.  Allergies: No Known Allergies  Medications Prior to Admission  Medication Sig Dispense Refill  . Alum & Mag Hydroxide-Simeth (MAGIC MOUTHWASH) SOLN Take 5 mLs by mouth 3 (three) times daily as needed for mouth pain.    [name] aspirin 325 MG tablet Take 325 mg by mouth daily.    [name] atorvastatin (LIPITOR) 40 MG  tablet TAKE 1 TABLET BY MOUTH EVERY DAY 90 tablet 2  . Blood Glucose Monitoring Suppl (ONE TOUCH ULTRA SYSTEM KIT) w/Device KIT 1 kit by Does not apply route once. Use DX code E11.59 1 each 0  . cilostazol (PLETAL) 100 MG tablet TAKE 1 TABLET(100 MG) BY MOUTH TWICE DAILY 60 tablet 3  . ciprofloxacin (CIPRO) 500 MG tablet Take 1 tablet (500 mg total) by mouth 2 (two) times daily. 22 tablet 0  . cyclobenzaprine (FLEXERIL) 5 MG tablet Take 1 tablet (5 mg total) by mouth 3 (three) times daily as needed for muscle spasms. 90 tablet 3  . ERIVEDGE 150 MG capsule Take 1 tablet by mouth daily.    . famotidine (PEPCID) 20 MG tablet TAKE 1 TABLET(20 MG) BY MOUTH TWICE DAILY 60 tablet 5  . fenofibrate micronized (LOFIBRA) 134 MG capsule TAKE 1 CAPSULE BY MOUTH EVERY MORNING BEFORE BREAKFAST 30 capsule 0  . finasteride (PROSCAR) 5 MG tablet Take 1 tablet (5 mg total) by mouth daily. 30 tablet 2  . gabapentin (NEURONTIN) 400 MG capsule TAKE 1 CAPSULE(400 MG) BY MOUTH THREE TIMES DAILY 120 capsule 2  . glipiZIDE (GLUCOTROL) 10 MG tablet TAKE 1 TABLET(10 MG) BY MOUTH TWICE DAILY 60 tablet 4  . glucose blood test strip Use as instructed three times daily 100 each 12  . HYDROcodone-acetaminophen (NORCO/VICODIN) 5-325 MG tablet One tablet two times daily as needed for knee pain 60 tablet 0  . INS  SYRINGE/NEEDLE 1CC/28G (B-D INSULIN SYRINGE 1CC/28G) 28G X 1/2" 1 ML MISC USE TO ADMINISTER INSULIN DAILY 100 each 0  . insulin lispro protamine-lispro (HUMALOG MIX 75/25) (75-25) 100 UNIT/ML SUSP injection Inject 10 units right before  breakfast and 6 units before dinner 10 mL 5  . INSULIN SYRINGE .5CC/29G 29G X 1/2" 0.5 ML MISC 1 Syringe by Does not apply route 3 (three) times daily. 100 each 6  . Insulin Syringe-Needle U-100 (INSULIN SYRINGE .5CC/31GX5/16") 31G X 5/16" 0.5 ML MISC USE THREE TIMES DAILY 100 each 5  . JANUVIA 100 MG tablet TAKE 1 TABLET(100 MG) BY MOUTH DAILY. NOTE DOSE INCREASE 30 tablet 3  . latanoprost  (XALATAN) 0.005 % ophthalmic solution INSTILL 1 DROP INTO BOTH EYES QD  5  . metFORMIN (GLUCOPHAGE) 1000 MG tablet TAKE 1 TABLET BY MOUTH TWICE DAILY 180 tablet 1  . metoprolol succinate (TOPROL-XL) 100 MG 24 hr tablet TAKE 1 TABLET BY MOUTH ONCE DAILY 30 tablet 5  . metroNIDAZOLE (FLAGYL) 500 MG tablet Take 1 tablet (500 mg total) by mouth every 8 (eight) hours. 33 tablet 0  . nitroGLYCERIN (NITROSTAT) 0.4 MG SL tablet Place 1 tablet (0.4 mg total) under the tongue every 5 (five) minutes as needed for chest pain. MAXIMUM 3 TABLETS 50 tablet 3  . ondansetron (ZOFRAN) 4 MG tablet Take 1 tablet (4 mg total) by mouth every 8 (eight) hours as needed for nausea or vomiting. 30 tablet 3  . ONETOUCH DELICA LANCETS 68E MISC Use three times daily to check blood sugar. 100 each 11  . pantoprazole (PROTONIX) 40 MG tablet Take 1 tablet (40 mg total) by mouth 2 (two) times daily before a meal. 60 tablet 0  . tamsulosin (FLOMAX) 0.4 MG CAPS capsule Take 0.4 mg by mouth daily after breakfast.    . traMADol (ULTRAM) 50 MG tablet Take 50 mg by mouth every 6 (six) hours as needed.       Results for orders placed or performed during the hospital encounter of 10/20/16 (from the past 48 hour(s))  CBC with Differential     Status: Abnormal   Collection Time: 10/20/16  9:59 PM  Result Value Ref Range   WBC 16.7 (H) 3.8 - 10.6 K/uL   RBC 5.31 4.40 - 5.90 MIL/uL   Hemoglobin 14.7 13.0 - 18.0 g/dL   HCT 44.1 40.0 - 52.0 %   MCV 83.0 80.0 - 100.0 fL   MCH 27.8 26.0 - 34.0 pg   MCHC 33.4 32.0 - 36.0 g/dL   RDW 14.5 11.5 - 14.5 %   Platelets 588 (H) 150 - 440 K/uL   Neutrophils Relative % 79 %   Neutro Abs 13.3 (H) 1.4 - 6.5 K/uL   Lymphocytes Relative 10 %   Lymphs Abs 1.6 1.0 - 3.6 K/uL   Monocytes Relative 8 %   Monocytes Absolute 1.4 (H) 0.2 - 1.0 K/uL   Eosinophils Relative 2 %   Eosinophils Absolute 0.3 0 - 0.7 K/uL   Basophils Relative 1 %   Basophils Absolute 0.1 0 - 0.1 K/uL  Comprehensive metabolic  panel     Status: Abnormal   Collection Time: 10/20/16  9:59 PM  Result Value Ref Range   Sodium 133 (L) 135 - 145 mmol/L   Potassium 5.1 3.5 - 5.1 mmol/L   Chloride 104 101 - 111 mmol/L   CO2 17 (L) 22 - 32 mmol/L   Glucose, Bld 217 (H) 65 - 99 mg/dL   BUN 44 (H)  6 - 20 mg/dL   Creatinine, Ser 1.61 (H) 0.61 - 1.24 mg/dL   Calcium 9.6 8.9 - 10.3 mg/dL   Total Protein 7.8 6.5 - 8.1 g/dL   Albumin 3.9 3.5 - 5.0 g/dL   AST 26 15 - 41 U/L   ALT 26 17 - 63 U/L   Alkaline Phosphatase 87 38 - 126 U/L   Total Bilirubin 0.6 0.3 - 1.2 mg/dL   GFR calc non Af Amer 38 (L) >60 mL/min   GFR calc Af Amer 45 (L) >60 mL/min    Comment: (NOTE) The eGFR has been calculated using the CKD EPI equation. This calculation has not been validated in all clinical situations. eGFR's persistently <60 mL/min signify possible Chronic Kidney Disease.    Anion gap 12 5 - 15  Lactic acid, plasma     Status: Abnormal   Collection Time: 10/20/16  9:59 PM  Result Value Ref Range   Lactic Acid, Venous 2.3 (HH) 0.5 - 1.9 mmol/L    Comment: CRITICAL RESULT CALLED TO, READ BACK BY AND VERIFIED WITH [facility] HATCH AT 2246 10/20/16.PMH  Lipase, blood     Status: Abnormal   Collection Time: 10/20/16  9:59 PM  Result Value Ref Range   Lipase 59 (H) 11 - 51 U/L  Troponin I     Status: None   Collection Time: 10/20/16  9:59 PM  Result Value Ref Range   Troponin I <0.03 <0.03 ng/mL  Protime-INR     Status: Abnormal   Collection Time: 10/20/16  9:59 PM  Result Value Ref Range   Prothrombin Time 15.6 (H) 11.4 - 15.2 seconds   INR 1.23   TSH     Status: None   Collection Time: 10/20/16  9:59 PM  Result Value Ref Range   TSH 2.943 0.350 - 4.500 uIU/mL    Comment: Performed by a 3rd Generation assay with a functional sensitivity of <=0.01 uIU/mL.  Culture, blood (Routine X 2) w Reflex to ID Panel     Status: None (Preliminary result)   Collection Time: 10/20/16 11:01 PM  Result Value Ref Range   Specimen Description  Peripheral    Special Requests NONE    Culture NO GROWTH < 12 HOURS    Report Status PENDING   Lactic acid, plasma     Status: None   Collection Time: 10/21/16 12:36 AM  Result Value Ref Range   Lactic Acid, Venous 1.5 0.5 - 1.9 mmol/L  Urinalysis, Complete w Microscopic     Status: Abnormal   Collection Time: 10/21/16 12:36 AM  Result Value Ref Range   Color, Urine YELLOW (A) YELLOW   APPearance CLEAR (A) CLEAR   Specific Gravity, Urine 1.024 1.005 - 1.030   pH 5.0 5.0 - 8.0   Glucose, UA 150 (A) NEGATIVE mg/dL   Hgb urine dipstick NEGATIVE NEGATIVE   Bilirubin Urine NEGATIVE NEGATIVE   Ketones, ur NEGATIVE NEGATIVE mg/dL   Protein, ur 30 (A) NEGATIVE mg/dL   Nitrite NEGATIVE NEGATIVE   Leukocytes, UA NEGATIVE NEGATIVE   RBC / HPF 0-5 0 - 5 RBC/hpf   WBC, UA 0-5 0 - 5 WBC/hpf   Bacteria, UA NONE SEEN NONE SEEN   Squamous Epithelial / LPF NONE SEEN NONE SEEN   Mucous PRESENT    Granular Casts, UA PRESENT   Culture, blood (Routine X 2) w Reflex to ID Panel     Status: None (Preliminary result)   Collection Time: 10/21/16 12:36 AM  Result Value  Ref Range   Specimen Description Peripheral    Special Requests NONE    Culture NO GROWTH < 12 HOURS    Report Status PENDING   Glucose, capillary     Status: Abnormal   Collection Time: 10/21/16  7:37 AM  Result Value Ref Range   Glucose-Capillary 131 (H) 65 - 99 mg/dL   Comment 1 Notify RN    Dg Abdomen 1 View  Result Date: 10/20/2016 CLINICAL DATA:  Right upper quadrant pain EXAM: ABDOMEN - 1 VIEW COMPARISON:  MRI 10/07/2016, 10/06/2016, CT 10/05/2016 FINDINGS: There is a nonobstructed bowel gas pattern. There is a drainage catheter in the right upper quadrant of the abdomen. No pathologic calcifications are visualized. IMPRESSION: Nonobstructed bowel-gas pattern. Drainage catheter present in the right upper quadrant. Electronically Signed   By: Donavan Foil M.D.   On: 10/20/2016 22:33   Ct Abdomen Pelvis W Contrast  Result  Date: 10/21/2016 CLINICAL DATA:  Right upper quadrant pain, status post cholecystostomy EXAM: CT ABDOMEN AND PELVIS WITH CONTRAST TECHNIQUE: Multidetector CT imaging of the abdomen and pelvis was performed using the standard protocol following bolus administration of intravenous contrast. CONTRAST:  58m ISOVUE-300 IOPAMIDOL (ISOVUE-300) INJECTION 61%, 1 ISOVUE-300 IOPAMIDOL (ISOVUE-300) INJECTION 61% COMPARISON:  Radiograph 10/20/2016, CT 10/05/2016, 04/10/2016, MRI 12 18,017 FINDINGS: Lower chest: Linear scar or atelectasis in the right lower lobe with calcified granuloma present. No acute infiltrate or pleural effusion is visualized. Coronary artery calcifications are present. Heart size within normal limits. No pericardial effusion. Hepatobiliary: Diffuse fatty infiltration of the liver. Previously noted mild intra hepatic biliary dilatation is no longer identified. Interim placement of a drainage catheter within the gallbladder fossa with decompression of the gallbladder. There is mild edema and inflammatory change around the catheter tract and the decompressed gallbladder. The previously noted small stones within the distal common bowel duct on the prior CT are not confidently identified on the current exam. There are other scattered calcifications at the pancreas which may be vascular in the etiology or possibly related to chronic pancreatitis. Pancreas: No peripancreatic inflammation is present. Spleen: Normal in size without focal abnormality. Adrenals/Urinary Tract: Adrenal glands are within normal limits. Kidneys show no hydronephrosis. Multiple hypodense renal lesions as before. Bladder unremarkable. Stomach/Bowel: Stomach is nonenlarged. No dilated small bowel. No colon wall thickening. Sigmoid diverticular disease without acute inflammation. Vascular/Lymphatic: Densely calcified aorta. No significantly enlarged lymph nodes. Reproductive: Prostate gland is slightly enlarged. Other: No free air or free  fluid. Musculoskeletal: Stable skeletal structures with marked multilevel degenerative changes of the spine as well as anterolisthesis of L4 on L5. Lucent inferior endplate lesion in L5 is unchanged. IMPRESSION: 1. Right upper quadrant drainage catheter present within the gallbladder fossa. The gallbladder is decompressed. Mild residual edema and soft tissue stranding in the right upper quadrant and surrounding the catheter suggests mild residual inflammatory changes. Previously noted intra hepatic biliary dilatation has resolved. Small calcifications that were previously noted in the region of the common bile duct at the head of the pancreas are not identified on the current exam. 2. Sigmoid colon diverticular disease without acute inflammation Electronically Signed   By: KDonavan FoilM.D.   On: 10/21/2016 01:32    Review of Systems  Constitutional: Negative for chills and fever.  HENT: Negative for sore throat and tinnitus.   Eyes: Negative for blurred vision and redness.  Respiratory: Negative for cough and shortness of breath.   Cardiovascular: Negative for chest pain, palpitations, orthopnea and PND.  Gastrointestinal: Positive  for abdominal pain and nausea. Negative for diarrhea and vomiting.  Genitourinary: Negative for dysuria, frequency and urgency.  Musculoskeletal: Negative for joint pain and myalgias.  Skin: Negative for rash.       No lesions  Neurological: Negative for speech change, focal weakness and weakness.  Endo/Heme/Allergies: Does not bruise/bleed easily.       No temperature intolerance  Psychiatric/Behavioral: Negative for depression and suicidal ideas.    Blood pressure 117/60, pulse 93, temperature 97.9 F (36.6 C), temperature source Oral, resp. rate 17, height '5\' 11"'  (1.803 m), weight 80.5 kg (177 lb 6.4 oz), SpO2 96 %. Physical Exam  Constitutional: He is oriented to person, place, and time. He appears well-developed and well-nourished. No distress.  HENT:   Head: Normocephalic and atraumatic.  Eyes: Conjunctivae and EOM are normal. Pupils are equal, round, and reactive to light. No scleral icterus.  Neck: Normal range of motion. Neck supple. No JVD present. No tracheal deviation present. No thyromegaly present.  Cardiovascular: Regular rhythm and normal heart sounds.  Tachycardia present.  Exam reveals no gallop and no friction rub.   No murmur heard. Respiratory: Effort normal and breath sounds normal.  GI: Soft. Bowel sounds are normal. He exhibits no distension. There is no tenderness.  Genitourinary:  Genitourinary Comments: Deferred  Musculoskeletal: Normal range of motion. He exhibits no edema.  Lymphadenopathy:    He has no cervical adenopathy.  Neurological: He is alert and oriented to person, place, and time. No cranial nerve deficit.  Skin: Skin is warm and dry. No rash noted. No erythema.  Psychiatric: He has a normal mood and affect. His behavior is normal. Judgment and thought content normal.     Assessment/Plan This is a 83 year old male admitted for sepsis. 1. Sepsis: The patient meets criteria via tachycardia and leukocytosis. He is hemodynamically stable. Continue Zosyn. Follow blood cultures for growth and sensitivities. 2. Choledocholithiasis/cholecystitis: Improving; gallbladder decompressed with only residual evidence of inflammation. 3. Diabetes mellitus type 2: Hold mixed insulin as well as oral hypoglycemic agents; continue basal insulin as well as sliding scale. 4. Essential hypertension: Controlled; continue metoprolol 5. BPH: Continue Flomax and finasteride 6. DVT prophylaxis: Heparin 7. GI prophylaxis: Pantoprazole per home regimen The patient is a full code. Time spent on admission orders and patient care approximately 45 minutes  Harrie Foreman, MD 10/21/2016, 7:58 AM

## 2016-10-21 NOTE — ED Notes (Signed)
Patient transported to CT 

## 2016-10-21 NOTE — Consult Note (Signed)
Date of Consultation:  10/21/2016  Requesting Physician:  Nicholes Mango, MD  Reason for Consultation:  Acute right upper quadrant abdominal pain  History of Present Illness: Henry Lindsey is a 83 y.o. male who had presented with acute cholecystitis on 12/16 with elevated LFTs requiring transfer to [facility]. Once there he had a percutaneous cholecystostomy tube and an MRCP which revealed no choledocholithiasis. Surgical team there and saw him as a consultation and had planned for possible cholecystectomy in the future pending cardiology clearance. Patient was discharged on 12/19 and had been at home in good condition. The patient reports that yesterday he started having acute worsening right upper quadrant pain. He reports that the pain is similar to the type of pain he had on his prior admission. Denies having any fevers, chills, chest pain, shortness of breath, nausea, or vomiting. He does report that he has some pain when taking deep breath but he is not having shortness of breath.  The emergency room he had laboratory workup which revealed normal LFTs with a white blood cell count of 16.7. CT scan was obtained which showed that the percutaneous cholecystostomy drain was within the gallbladder in good position with mild residual edema and soft tissue stranding in the right upper quadrant. There is some stool burden in the colon particularly in the right and transverse area. The patient has been having bowel movements and denies having to do any significant straining for it.  Past Medical History: Past Medical History:  Diagnosis Date  . BPH (benign prostatic hyperplasia)   . Cancer ([place])   . Carotid artery stenosis Feb 2012   <50% bilaterally  . CVA (cerebral infarction) feb 2012   r thalamic lacunar  . Diabetes mellitus   . Hyperlipidemia   . Hypertension   . Neuromuscular disorder ([place])   . Peripheral vascular disease in diabetes mellitus [facility]) Sept 2013    95% occlusion s/p PTCA R SFA  Dew Sept 2013     Past Surgical History: Past Surgical History:  Procedure Laterality Date  . IR GENERIC HISTORICAL  10/06/2016   IR PERC CHOLECYSTOSTOMY 10/06/2016 Aletta Edouard, MD [facility]-INTERV RAD  . JOINT REPLACEMENT Left 2014   left knee  . UPPER GASTROINTESTINAL ENDOSCOPY      Home Medications: Prior to Admission medications   Medication Sig Start Date End Date Taking? Authorizing Provider  Alum & Mag Hydroxide-Simeth (MAGIC MOUTHWASH) SOLN Take 5 mLs by mouth 3 (three) times daily as needed for mouth pain.   Yes Historical Provider, MD  aspirin 325 MG tablet Take 325 mg by mouth daily.   Yes Historical Provider, MD  atorvastatin (LIPITOR) 40 MG tablet TAKE 1 TABLET BY MOUTH EVERY DAY 07/15/16  Yes [name], MD  Blood Glucose Monitoring Suppl (ONE TOUCH ULTRA SYSTEM KIT) w/Device KIT 1 kit by Does not apply route once. Use DX code E11.59 10/18/15  Yes [name], MD  cilostazol (PLETAL) 100 MG tablet TAKE 1 TABLET(100 MG) BY MOUTH TWICE DAILY 08/13/16  Yes [name], MD  ciprofloxacin (CIPRO) 500 MG tablet Take 1 tablet (500 mg total) by mouth 2 (two) times daily. 10/08/16  Yes Orson Eva, MD  cyclobenzaprine (FLEXERIL) 5 MG tablet Take 1 tablet (5 mg total) by mouth 3 (three) times daily as needed for muscle spasms. 09/10/16  Yes [name], MD  ERIVEDGE 150 MG capsule Take 1 tablet by mouth daily. 08/23/16  Yes Historical Provider, MD  famotidine (PEPCID) 20 MG tablet  TAKE 1 TABLET(20 MG) BY MOUTH TWICE DAILY 05/08/16  Yes [name], MD  fenofibrate micronized (LOFIBRA) 134 MG capsule TAKE 1 CAPSULE BY MOUTH EVERY MORNING BEFORE BREAKFAST 08/28/16  Yes [name], MD  finasteride (PROSCAR) 5 MG tablet Take 1 tablet (5 mg total) by mouth daily. 05/04/15  Yes [name], MD  gabapentin (NEURONTIN) 400 MG capsule TAKE 1 CAPSULE(400 MG) BY MOUTH THREE TIMES DAILY 09/09/16  Yes [name], MD  glipiZIDE (GLUCOTROL) 10 MG tablet TAKE 1 TABLET(10 MG) BY MOUTH  TWICE DAILY 03/27/16  Yes [name], MD  glucose blood test strip Use as instructed three times daily 05/03/16  Yes [name], MD  HYDROcodone-acetaminophen (NORCO/VICODIN) 5-325 MG tablet One tablet two times daily as needed for knee pain 09/03/16  Yes [name], MD  INS SYRINGE/NEEDLE 1CC/28G (B-D INSULIN SYRINGE 1CC/28G) 28G X 1/2" 1 ML MISC USE TO ADMINISTER INSULIN DAILY 02/13/16  Yes [name], MD  insulin lispro protamine-lispro (HUMALOG MIX 75/25) (75-25) 100 UNIT/ML SUSP injection Inject 10 units right before  breakfast and 6 units before dinner 05/03/16  Yes [name], MD  INSULIN SYRINGE .5CC/29G 29G X 1/2" 0.5 ML MISC 1 Syringe by Does not apply route 3 (three) times daily. 08/05/11  Yes [name], MD  Insulin Syringe-Needle U-100 (INSULIN SYRINGE .5CC/31GX5/16") 31G X 5/16" 0.5 ML MISC USE THREE TIMES DAILY 03/18/13  Yes [name], MD  JANUVIA 100 MG tablet TAKE 1 TABLET(100 MG) BY MOUTH DAILY. NOTE DOSE INCREASE 08/13/16  Yes [name], MD  latanoprost (XALATAN) 0.005 % ophthalmic solution INSTILL 1 DROP INTO BOTH EYES QD 11/01/15  Yes Historical Provider, MD  metFORMIN (GLUCOPHAGE) 1000 MG tablet TAKE 1 TABLET BY MOUTH TWICE DAILY 07/26/16  Yes [name], MD  metoprolol succinate (TOPROL-XL) 100 MG 24 hr tablet TAKE 1 TABLET BY MOUTH ONCE DAILY 09/03/13  Yes [name], MD  metroNIDAZOLE (FLAGYL) 500 MG tablet Take 1 tablet (500 mg total) by mouth every 8 (eight) hours. 10/08/16  Yes Orson Eva, MD  nitroGLYCERIN (NITROSTAT) 0.4 MG SL tablet Place 1 tablet (0.4 mg total) under the tongue every 5 (five) minutes as needed for chest pain. MAXIMUM 3 TABLETS 10/04/16  Yes [name], MD  ondansetron (ZOFRAN) 4 MG tablet Take 1 tablet (4 mg total) by mouth every 8 (eight) hours as needed for nausea or vomiting. 09/03/16  Yes [name], MD  [facility] DELICA LANCETS 60V MISC Use three times daily to check blood sugar. 10/18/15  Yes [name],  MD  pantoprazole (PROTONIX) 40 MG tablet Take 1 tablet (40 mg total) by mouth 2 (two) times daily before a meal. 10/04/16  Yes [name], MD  tamsulosin (FLOMAX) 0.4 MG CAPS capsule Take 0.4 mg by mouth daily after breakfast.   Yes Historical Provider, MD  traMADol (ULTRAM) 50 MG tablet Take 50 mg by mouth every 6 (six) hours as needed.  11/24/13  Yes Historical Provider, MD    Allergies: No Known Allergies  Social History:  reports that he quit smoking about 48 years ago. He has never used smokeless tobacco. He reports that he does not drink alcohol or use drugs.   Family History: Family History  Problem Relation Age of Onset  . Diabetes Mother     Review of Systems: Review of Systems  Constitutional: Negative for chills and fever.  HENT: Negative for hearing loss.  Eyes: Negative for blurred vision.  Respiratory: Negative for cough and shortness of breath.   Cardiovascular: Negative for chest pain and leg swelling.  Gastrointestinal: Positive for abdominal pain. Negative for constipation, diarrhea, heartburn, nausea and vomiting.  Genitourinary: Negative for dysuria.  Musculoskeletal: Negative for myalgias.  Skin: Negative for rash.  Neurological: Negative for dizziness.  Psychiatric/Behavioral: Negative for depression.    Physical Exam BP 98/60 (BP Location: Right Arm)   Pulse 75   Temp 98.3 F (36.8 C) (Oral)   Resp 20   Ht 5' 11" (1.803 m)   Wt 80.5 kg (177 lb 6.4 oz)   SpO2 93%   BMI 24.74 kg/m  CONSTITUTIONAL: No acute distress HEENT:  Normocephalic, atraumatic, extraocular motion intact. NECK: Trachea is midline, and there is no jugular venous distension.  RESPIRATORY:  Lungs are clear, and breath sounds are equal bilaterally. Normal respiratory effort without pathologic use of accessory muscles. CARDIOVASCULAR: Heart is regular without murmurs, gallops, or rubs. GI: The abdomen is soft, nondistended, with mild tenderness to palpation in the right upper  quadrant around the exit site of the drain. The drain is sutured in place in the skin around the drain is clean dry and intact with no evidence of infection or abscess. There is bilious fluid in the drainage bag. MUSCULOSKELETAL:  Normal muscle strength and tone in all four extremities.  No peripheral edema or cyanosis. SKIN: Skin turgor is normal. There are no pathologic skin lesions.  NEUROLOGIC:  Motor and sensation is grossly normal.  Cranial nerves are grossly intact. PSYCH:  Alert and oriented to person, place and time. Affect is normal.  Laboratory Analysis: Results for orders placed or performed during the hospital encounter of 10/20/16 (from the past 24 hour(s))  CBC with Differential     Status: Abnormal   Collection Time: 10/20/16  9:59 PM  Result Value Ref Range   WBC 16.7 (H) 3.8 - 10.6 K/uL   RBC 5.31 4.40 - 5.90 MIL/uL   Hemoglobin 14.7 13.0 - 18.0 g/dL   HCT 44.1 40.0 - 52.0 %   MCV 83.0 80.0 - 100.0 fL   MCH 27.8 26.0 - 34.0 pg   MCHC 33.4 32.0 - 36.0 g/dL   RDW 14.5 11.5 - 14.5 %   Platelets 588 (H) 150 - 440 K/uL   Neutrophils Relative % 79 %   Neutro Abs 13.3 (H) 1.4 - 6.5 K/uL   Lymphocytes Relative 10 %   Lymphs Abs 1.6 1.0 - 3.6 K/uL   Monocytes Relative 8 %   Monocytes Absolute 1.4 (H) 0.2 - 1.0 K/uL   Eosinophils Relative 2 %   Eosinophils Absolute 0.3 0 - 0.7 K/uL   Basophils Relative 1 %   Basophils Absolute 0.1 0 - 0.1 K/uL  Comprehensive metabolic panel     Status: Abnormal   Collection Time: 10/20/16  9:59 PM  Result Value Ref Range   Sodium 133 (L) 135 - 145 mmol/L   Potassium 5.1 3.5 - 5.1 mmol/L   Chloride 104 101 - 111 mmol/L   CO2 17 (L) 22 - 32 mmol/L   Glucose, Bld 217 (H) 65 - 99 mg/dL   BUN 44 (H) 6 - 20 mg/dL   Creatinine, Ser 1.61 (H) 0.61 - 1.24 mg/dL   Calcium 9.6 8.9 - 10.3 mg/dL   Total Protein 7.8 6.5 - 8.1 g/dL   Albumin 3.9 3.5 - 5.0 g/dL   AST 26 15 - 41 U/L   ALT 26 17 -  63 U/L   Alkaline Phosphatase 87 38 - 126 U/L    Total Bilirubin 0.6 0.3 - 1.2 mg/dL   GFR calc non Af Amer 38 (L) >60 mL/min   GFR calc Af Amer 45 (L) >60 mL/min   Anion gap 12 5 - 15  Lactic acid, plasma     Status: Abnormal   Collection Time: 10/20/16  9:59 PM  Result Value Ref Range   Lactic Acid, Venous 2.3 (HH) 0.5 - 1.9 mmol/L  Lipase, blood     Status: Abnormal   Collection Time: 10/20/16  9:59 PM  Result Value Ref Range   Lipase 59 (H) 11 - 51 U/L  Troponin I     Status: None   Collection Time: 10/20/16  9:59 PM  Result Value Ref Range   Troponin I <0.03 <0.03 ng/mL  Protime-INR     Status: Abnormal   Collection Time: 10/20/16  9:59 PM  Result Value Ref Range   Prothrombin Time 15.6 (H) 11.4 - 15.2 seconds   INR 1.23   TSH     Status: None   Collection Time: 10/20/16  9:59 PM  Result Value Ref Range   TSH 2.943 0.350 - 4.500 uIU/mL  Culture, blood (Routine X 2) w Reflex to ID Panel     Status: None (Preliminary result)   Collection Time: 10/20/16 11:01 PM  Result Value Ref Range   Specimen Description Peripheral    Special Requests NONE    Culture NO GROWTH < 12 HOURS    Report Status PENDING   Lactic acid, plasma     Status: None   Collection Time: 10/21/16 12:36 AM  Result Value Ref Range   Lactic Acid, Venous 1.5 0.5 - 1.9 mmol/L  Urinalysis, Complete w Microscopic     Status: Abnormal   Collection Time: 10/21/16 12:36 AM  Result Value Ref Range   Color, Urine YELLOW (A) YELLOW   APPearance CLEAR (A) CLEAR   Specific Gravity, Urine 1.024 1.005 - 1.030   pH 5.0 5.0 - 8.0   Glucose, UA 150 (A) NEGATIVE mg/dL   Hgb urine dipstick NEGATIVE NEGATIVE   Bilirubin Urine NEGATIVE NEGATIVE   Ketones, ur NEGATIVE NEGATIVE mg/dL   Protein, ur 30 (A) NEGATIVE mg/dL   Nitrite NEGATIVE NEGATIVE   Leukocytes, UA NEGATIVE NEGATIVE   RBC / HPF 0-5 0 - 5 RBC/hpf   WBC, UA 0-5 0 - 5 WBC/hpf   Bacteria, UA NONE SEEN NONE SEEN   Squamous Epithelial / LPF NONE SEEN NONE SEEN   Mucous PRESENT    Granular Casts, UA PRESENT    Culture, blood (Routine X 2) w Reflex to ID Panel     Status: None (Preliminary result)   Collection Time: 10/21/16 12:36 AM  Result Value Ref Range   Specimen Description Peripheral    Special Requests NONE    Culture NO GROWTH < 12 HOURS    Report Status PENDING   Glucose, capillary     Status: Abnormal   Collection Time: 10/21/16  7:37 AM  Result Value Ref Range   Glucose-Capillary 131 (H) 65 - 99 mg/dL   Comment 1 Notify RN   Glucose, capillary     Status: Abnormal   Collection Time: 10/21/16 11:42 AM  Result Value Ref Range   Glucose-Capillary 136 (H) 65 - 99 mg/dL   Comment 1 Notify RN     Imaging: Dg Abdomen 1 View  Result Date: 10/20/2016 CLINICAL DATA:  Right upper quadrant pain  EXAM: ABDOMEN - 1 VIEW COMPARISON:  MRI 10/07/2016, 10/06/2016, CT 10/05/2016 FINDINGS: There is a nonobstructed bowel gas pattern. There is a drainage catheter in the right upper quadrant of the abdomen. No pathologic calcifications are visualized. IMPRESSION: Nonobstructed bowel-gas pattern. Drainage catheter present in the right upper quadrant. Electronically Signed   By: Donavan Foil M.D.   On: 10/20/2016 22:33   Ct Abdomen Pelvis W Contrast  Result Date: 10/21/2016 CLINICAL DATA:  Right upper quadrant pain, status post cholecystostomy EXAM: CT ABDOMEN AND PELVIS WITH CONTRAST TECHNIQUE: Multidetector CT imaging of the abdomen and pelvis was performed using the standard protocol following bolus administration of intravenous contrast. CONTRAST:  48m ISOVUE-300 IOPAMIDOL (ISOVUE-300) INJECTION 61%, 1 ISOVUE-300 IOPAMIDOL (ISOVUE-300) INJECTION 61% COMPARISON:  Radiograph 10/20/2016, CT 10/05/2016, 04/10/2016, MRI 12 18,017 FINDINGS: Lower chest: Linear scar or atelectasis in the right lower lobe with calcified granuloma present. No acute infiltrate or pleural effusion is visualized. Coronary artery calcifications are present. Heart size within normal limits. No pericardial effusion. Hepatobiliary:  Diffuse fatty infiltration of the liver. Previously noted mild intra hepatic biliary dilatation is no longer identified. Interim placement of a drainage catheter within the gallbladder fossa with decompression of the gallbladder. There is mild edema and inflammatory change around the catheter tract and the decompressed gallbladder. The previously noted small stones within the distal common bowel duct on the prior CT are not confidently identified on the current exam. There are other scattered calcifications at the pancreas which may be vascular in the etiology or possibly related to chronic pancreatitis. Pancreas: No peripancreatic inflammation is present. Spleen: Normal in size without focal abnormality. Adrenals/Urinary Tract: Adrenal glands are within normal limits. Kidneys show no hydronephrosis. Multiple hypodense renal lesions as before. Bladder unremarkable. Stomach/Bowel: Stomach is nonenlarged. No dilated small bowel. No colon wall thickening. Sigmoid diverticular disease without acute inflammation. Vascular/Lymphatic: Densely calcified aorta. No significantly enlarged lymph nodes. Reproductive: Prostate gland is slightly enlarged. Other: No free air or free fluid. Musculoskeletal: Stable skeletal structures with marked multilevel degenerative changes of the spine as well as anterolisthesis of L4 on L5. Lucent inferior endplate lesion in L5 is unchanged. IMPRESSION: 1. Right upper quadrant drainage catheter present within the gallbladder fossa. The gallbladder is decompressed. Mild residual edema and soft tissue stranding in the right upper quadrant and surrounding the catheter suggests mild residual inflammatory changes. Previously noted intra hepatic biliary dilatation has resolved. Small calcifications that were previously noted in the region of the common bile duct at the head of the pancreas are not identified on the current exam. 2. Sigmoid colon diverticular disease without acute inflammation  Electronically Signed   By: KDonavan FoilM.D.   On: 10/21/2016 01:32    Assessment and Plan: This is a 83y.o. male who presents with right upper quadrant pain in the setting of a percutaneous cholecystostomy tube for acute cholecystitis 2 weeks ago. I personally reviewed all the patient's laboratory and imaging studies. The percutaneous cholecystostomy tube was in good place within the gallbladder and draining appropriately. There does not seem to be any evidence of new choledocholithiasis. There is mild residual stranding around the gallbladder but no abscess formation. The patient's pain is localized near the drain exit site but the drain appears to be working well.  -Given some of the stool burden in the colon on the CT scan would recommend giving MiraLAX schedule daily to help decrease some of the burden and see this helps with his pain. -Continue IV antibiotics for treatment  of his cholecystitis. At this time there are no new procedures planned. Patient may have a low-fat diet as tolerated. -Surgery team will be available here in case of any urgent surgical needs but otherwise the patient can continue with his current plan for surgery in the future with the [facility] team pending cardiology clearance. -Will continue following along with you.   Melvyn Neth, [place]

## 2016-10-22 ENCOUNTER — Telehealth: Payer: Self-pay | Admitting: Internal Medicine

## 2016-10-22 ENCOUNTER — Ambulatory Visit: Payer: Medicare Other | Admitting: Internal Medicine

## 2016-10-22 LAB — CBC WITH DIFFERENTIAL/PLATELET
BASOS ABS: 0.1 10*3/uL (ref 0–0.1)
BASOS PCT: 2 %
EOS PCT: 4 %
Eosinophils Absolute: 0.3 10*3/uL (ref 0–0.7)
HCT: 31.5 % — ABNORMAL LOW (ref 40.0–52.0)
Hemoglobin: 10.9 g/dL — ABNORMAL LOW (ref 13.0–18.0)
Lymphocytes Relative: 16 %
Lymphs Abs: 1.1 10*3/uL (ref 1.0–3.6)
MCH: 28.6 pg (ref 26.0–34.0)
MCHC: 34.4 g/dL (ref 32.0–36.0)
MCV: 82.9 fL (ref 80.0–100.0)
MONO ABS: 0.6 10*3/uL (ref 0.2–1.0)
MONOS PCT: 10 %
Neutro Abs: 4.5 10*3/uL (ref 1.4–6.5)
Neutrophils Relative %: 68 %
Platelets: 410 10*3/uL (ref 150–440)
RBC: 3.8 MIL/uL — ABNORMAL LOW (ref 4.40–5.90)
RDW: 14.6 % — AB (ref 11.5–14.5)
WBC: 6.6 10*3/uL (ref 3.8–10.6)

## 2016-10-22 LAB — URINE CULTURE: Culture: <10000 — AB

## 2016-10-22 LAB — GLUCOSE, CAPILLARY
Glucose-Capillary: 148 mg/dL — ABNORMAL HIGH (ref 65–99)
Glucose-Capillary: 166 mg/dL — ABNORMAL HIGH (ref 65–99)
Glucose-Capillary: 188 mg/dL — ABNORMAL HIGH (ref 65–99)
Glucose-Capillary: 203 mg/dL — ABNORMAL HIGH (ref 65–99)

## 2016-10-22 MED ORDER — PREMIER PROTEIN SHAKE
11.0000 [oz_av] | Freq: Two times a day (BID) | ORAL | Status: DC
  Administered 2016-10-22 – 2016-10-23 (×2): 11 [oz_av] via ORAL

## 2016-10-22 MED ORDER — AMOXICILLIN-POT CLAVULANATE 875-125 MG PO TABS
1.0000 | ORAL_TABLET | Freq: Two times a day (BID) | ORAL | Status: DC
  Administered 2016-10-22 – 2016-10-23 (×2): 1 via ORAL
  Filled 2016-10-22 (×2): qty 1

## 2016-10-22 MED ORDER — ENSURE ENLIVE PO LIQD
237.0000 mL | ORAL | Status: DC
  Administered 2016-10-22: 237 mL via ORAL

## 2016-10-22 NOTE — Telephone Encounter (Signed)
FYI

## 2016-10-22 NOTE — Telephone Encounter (Signed)
FYI - Pt called and stated that he is cancelling his appt today, he is still in the hospital.

## 2016-10-22 NOTE — Progress Notes (Addendum)
10/22/2016  Subjective: No acute events overnight. Patient tolerated clear liquid diet. Patient reports that his pain is doing better today. White blood cell count has normalized. LFTs remained normal.  Vital signs: Temp:  [97.5 F (36.4 C)-98.3 F (36.8 C)] 97.5 F (36.4 C) (01/02 0447) Pulse Rate:  [70-95] 93 (01/02 0447) Resp:  [16-20] 20 (01/02 0447) BP: (98-130)/(55-67) 130/67 (01/02 0447) SpO2:  [93 %-99 %] 96 % (01/02 0447) Weight:  [83.6 kg (184 lb 6.4 oz)] 83.6 kg (184 lb 6.4 oz) (01/02 0606)   Intake/Output: 01/01 0701 - 01/02 0700 In: 5218.5 [P.O.:1830; I.V.:3304.5; IV Piggyback:84] Out: 4205 [Urine:3325; Drains:880] Last BM Date: 10/21/16  Physical Exam: Constitutional: No acute distress Abdomen: Soft, nondistended, minimal discomfort over the right upper quadrant adjacent to the drain site. Percutaneous cholecystostomy tube in place with bilious drainage in the bag.  Labs:   Recent Labs  10/20/16 2159 10/22/16 0521  WBC 16.7* 6.6  HGB 14.7 10.9*  HCT 44.1 31.5*  PLT 588* 410    Recent Labs  10/20/16 2159 10/21/16 1733  NA 133* 136  K 5.1 3.7  CL 104 111  CO2 17* 21*  GLUCOSE 217* 149*  BUN 44* 22*  CREATININE 1.61* 1.11  CALCIUM 9.6 7.9*    Recent Labs  10/20/16 2159  LABPROT 15.6*  INR 1.23    Imaging: No results found.  Assessment/Plan: 83 year old male status post percutaneous cholecystostomy tube placement at outside hospital for acute cholecystitis.  -Advance diet as tolerated to low-fat diet today. -Continue antibiotics and may transition to oral. -Patient can follow-up with his surgery group at [facility] for interval cholecystectomy. -Please feel free to call or page of any questions or concerns.   Melvyn Neth, [place]

## 2016-10-22 NOTE — Progress Notes (Signed)
Initial Nutrition Assessment  DOCUMENTATION CODES:   Not applicable  INTERVENTION:  Provide Ensure Enlive po once daily, each supplement provides 350 kcal and 20 grams of protein.  Provide Premier Protein po BID, each supplement provides 160 kcal and 30 grams of protein.  Reviewed Fat-Restricted Nutrition Therapy handout with patient and wife.  NUTRITION DIAGNOSIS:   Increased nutrient needs related to catabolic illness (cholecystitis) as evidenced by estimated needs.   GOAL:   Patient will meet greater than or equal to 90% of their needs  MONITOR:   PO intake, Supplement acceptance, Labs, Weight trends, I & O's  REASON FOR ASSESSMENT:   Malnutrition Screening Tool    ASSESSMENT:   83 year old male with PMHx of diabetes mellitus type 2, HTN, CVA admitted for sepsis, acute right upper quadrant abdominal pain secondary to acute cholecystitis s/p cholecystostomy tube 2 weeks ago.   Spoke with patient and wife at bedside. Patient reports his appetite is good. He denies nausea/vomiting, abdominal pain, or constipation/diarrhea. He does report that the chemotherapy pills he takes for basal cell carcinoma on his face have affected his ability to taste, so he has not been eating much meat lately. He reports eating 3 meals per day, which consist of turnip greens, potatoes, asparagus, and other vegetables. He has been drinking Boost and Premier Protein at home to supplement protein intake.  Patient reports UBW 197-206 lbs. He has lost 13 lbs (6.5% weight loss) over 3 months, which is not significant for time frame.  Meal Completion: 100%  Medications reviewed and include: Colace, Novolog sliding scale TID with meals, Lantus 10 units daily at bedtime, pantoprazole, Zosyn, Miralax, NS @ 150 ml/hr.  Labs reviewed: CBG 116-172 past 24 hrs, CO2 21, BUN 22, Anion gap 4.  Nutrition-Focused physical exam completed. Findings are no fat depletion, mild muscle depletion, and no  edema.  Discussed with RN. Patient will be on Heart Healthy diet as no other way to restrict fat.  Diet Order:  Diet Heart Room service appropriate? Yes; Fluid consistency: Thin  Skin:  Reviewed, no issues  Last BM:  10/21/2016  Height:   Ht Readings from Last 1 Encounters:  10/21/16 5\' 11"  (1.803 m)    Weight:   Wt Readings from Last 1 Encounters:  10/22/16 184 lb 6.4 oz (83.6 kg)    Ideal Body Weight:  72.7 kg  BMI:  Body mass index is 25.72 kg/m.  Estimated Nutritional Needs:   Kcal:  1900-2200 (MSJ x 1.2-1.4)  Protein:  85-100 grams (1-1.2 grams/kg)  Fluid:  >/= 2 L/day (25 ml/kg)  EDUCATION NEEDS:   Education needs addressed (Fat-Restricted Diet)  Willey Blade, MS, RD, LDN Pager: [phone] After Hours Pager: [phone]

## 2016-10-22 NOTE — Progress Notes (Signed)
[facility] NAME: Henry Lindsey    MR#:  [id number]  DATE OF BIRTH:  August 08, 1933  SUBJECTIVE:  CHIEF COMPLAINT:  Right abdominal pain resolved. Tolerating clears  REVIEW OF SYSTEMS:  CONSTITUTIONAL: No fever, fatigue or weakness.  EYES: No blurred or double vision.  EARS, NOSE, AND THROAT: No tinnitus or ear pain.  RESPIRATORY: No cough, shortness of breath, wheezing or hemoptysis.  CARDIOVASCULAR: No chest pain, orthopnea, edema.  GASTROINTESTINAL: No nausea, vomiting, diarrhea . Improved right-sided abdominal pain GENITOURINARY: No dysuria, hematuria.  ENDOCRINE: No polyuria, nocturia,  HEMATOLOGY: No anemia, easy bruising or bleeding SKIN: No rash or lesion. MUSCULOSKELETAL: No joint pain or arthritis.   NEUROLOGIC: No tingling, numbness, weakness.  PSYCHIATRY: No anxiety or depression.   DRUG ALLERGIES:  No Known Allergies  VITALS:  Blood pressure (!) 120/58, pulse 79, temperature 97.9 F (36.6 C), temperature source Oral, resp. rate 16, height 5\' 11"  (1.803 m), weight 83.6 kg (184 lb 6.4 oz), SpO2 98 %.  PHYSICAL EXAMINATION:  GENERAL:  83 y.o.-year-old patient lying in the bed with no acute distress.  EYES: Pupils equal, round, reactive to light and accommodation. No scleral icterus. Extraocular muscles intact.  HEENT: Head atraumatic, normocephalic. Oropharynx and nasopharynx clear.  NECK:  Supple, no jugular venous distention. No thyroid enlargement, no tenderness.  LUNGS: Normal breath sounds bilaterally, no wheezing, rales,rhonchi or crepitation. No use of accessory muscles of respiration.  CARDIOVASCULAR: S1, S2 normal. No murmurs, rubs, or gallops.  ABDOMEN: Soft, Nontender, no rebound tenderness, right upper quadrant with intact drain, with brown fluid in the bag nondistended. Bowel sounds present. No organomegaly or mass.  EXTREMITIES: No pedal edema, cyanosis, or clubbing.  NEUROLOGIC: Cranial nerves II  through XII are intact. Muscle strength 5/5 in all extremities. Sensation intact. Gait not checked.  PSYCHIATRIC: The patient is alert and oriented x 3.  SKIN: No obvious rash, lesion, or ulcer.    LABORATORY PANEL:   CBC  Recent Labs Lab 10/22/16 0521  WBC 6.6  HGB 10.9*  HCT 31.5*  PLT 410   ------------------------------------------------------------------------------------------------------------------  Chemistries   Recent Labs Lab 10/21/16 1733  NA 136  K 3.7  CL 111  CO2 21*  GLUCOSE 149*  BUN 22*  CREATININE 1.11  CALCIUM 7.9*  AST 19  ALT 18  ALKPHOS 62  BILITOT 0.6   ------------------------------------------------------------------------------------------------------------------  Cardiac Enzymes  Recent Labs Lab 10/20/16 2159  TROPONINI <0.03   ------------------------------------------------------------------------------------------------------------------  RADIOLOGY:  Dg Abdomen 1 View  Result Date: 10/20/2016 CLINICAL DATA:  Right upper quadrant pain EXAM: ABDOMEN - 1 VIEW COMPARISON:  MRI 10/07/2016, 10/06/2016, CT 10/05/2016 FINDINGS: There is a nonobstructed bowel gas pattern. There is a drainage catheter in the right upper quadrant of the abdomen. No pathologic calcifications are visualized. IMPRESSION: Nonobstructed bowel-gas pattern. Drainage catheter present in the right upper quadrant. Electronically Signed   By: Donavan Foil M.D.   On: 10/20/2016 22:33   Ct Abdomen Pelvis W Contrast  Result Date: 10/21/2016 CLINICAL DATA:  Right upper quadrant pain, status post cholecystostomy EXAM: CT ABDOMEN AND PELVIS WITH CONTRAST TECHNIQUE: Multidetector CT imaging of the abdomen and pelvis was performed using the standard protocol following bolus administration of intravenous contrast. CONTRAST:  10mL ISOVUE-300 IOPAMIDOL (ISOVUE-300) INJECTION 61%, 1 ISOVUE-300 IOPAMIDOL (ISOVUE-300) INJECTION 61% COMPARISON:  Radiograph 10/20/2016, CT 10/05/2016,  04/10/2016, MRI 12 18,017 FINDINGS: Lower chest: Linear scar or atelectasis in the right lower lobe with calcified granuloma present.  No acute infiltrate or pleural effusion is visualized. Coronary artery calcifications are present. Heart size within normal limits. No pericardial effusion. Hepatobiliary: Diffuse fatty infiltration of the liver. Previously noted mild intra hepatic biliary dilatation is no longer identified. Interim placement of a drainage catheter within the gallbladder fossa with decompression of the gallbladder. There is mild edema and inflammatory change around the catheter tract and the decompressed gallbladder. The previously noted small stones within the distal common bowel duct on the prior CT are not confidently identified on the current exam. There are other scattered calcifications at the pancreas which may be vascular in the etiology or possibly related to chronic pancreatitis. Pancreas: No peripancreatic inflammation is present. Spleen: Normal in size without focal abnormality. Adrenals/Urinary Tract: Adrenal glands are within normal limits. Kidneys show no hydronephrosis. Multiple hypodense renal lesions as before. Bladder unremarkable. Stomach/Bowel: Stomach is nonenlarged. No dilated small bowel. No colon wall thickening. Sigmoid diverticular disease without acute inflammation. Vascular/Lymphatic: Densely calcified aorta. No significantly enlarged lymph nodes. Reproductive: Prostate gland is slightly enlarged. Other: No free air or free fluid. Musculoskeletal: Stable skeletal structures with marked multilevel degenerative changes of the spine as well as anterolisthesis of L4 on L5. Lucent inferior endplate lesion in L5 is unchanged. IMPRESSION: 1. Right upper quadrant drainage catheter present within the gallbladder fossa. The gallbladder is decompressed. Mild residual edema and soft tissue stranding in the right upper quadrant and surrounding the catheter suggests mild residual  inflammatory changes. Previously noted intra hepatic biliary dilatation has resolved. Small calcifications that were previously noted in the region of the common bile duct at the head of the pancreas are not identified on the current exam. 2. Sigmoid colon diverticular disease without acute inflammation Electronically Signed   By: Donavan Foil M.D.   On: 10/21/2016 01:32    EKG:   Orders placed or performed during the hospital encounter of 10/20/16  . ED EKG  . ED EKG  . EKG 12-Lead  . EKG 12-Lead  . EKG 12-Lead  . EKG 12-Lead  . EKG 12-Lead  . EKG 12-Lead  . EKG 12-Lead  . EKG 12-Lead  . EKG 12-Lead  . EKG 12-Lead  . EKG 12-Lead  . EKG 12-Lead    ASSESSMENT AND PLAN:    This is a 83 year old male admitted for sepsis.  1. Sepsis: The patient meets criteria via tachycardia and leukocytosis. He is hemodynamically stable. Improved with Zosyn. Discontinue Zosyn as patient is clinically improving and start by mouth Augmentin Blood cultures no growth in less than 12 hours  urine culture Less than 10,000 colonies, insignificant growth  2. Acute right upper quadrant abdominal pain secondary to acute cholecystitis: Status post cholecystostomy tube 2 weeks ago and status post antibiotics Cipro and Flagyl treatment. Completed the course Clinically Improving; gallbladder decompressed with only residual evidence of inflammation. Surgery is recommending no new procedures at this time. Follow-up with surgery Change IV Zosyn to by mouth Augmentin  Advance diet as tolerated to low-fat  Anticipate discharge in a.m. Outpatient follow-up with [facility] as recommended by them  MiraLAX for stool burden in the colon-revealed on CT scan Pain management as needed  3. Diabetes mellitus type 2: Hold mixed insulin as well as oral hypoglycemic agents; continue basal insulin as well as sliding scale.  4. Essential hypertension: Controlled; continue metoprolol  5. BPH: Continue Flomax and  finasteride  6. DVT prophylaxis: Heparin  7. GI prophylaxis: Pantoprazole per home regimen     All the records  are reviewed and case discussed with Care Management/Social Workerr. Management plans discussed with the patient, family and they are in agreement.  CODE STATUS: fc  TOTAL TIME TAKING CARE OF THIS PATIENT: 36  minutes.   POSSIBLE D/C IN 2 -3 DAYS, DEPENDING ON CLINICAL CONDITION.  Note: This dictation was prepared with Dragon dictation along with smaller phrase technology. Any transcriptional errors that result from this process are unintentional.   Nicholes Mango M.D on 10/22/2016 at 3:53 PM  Between 7am to 6pm - Pager - [phone] After 6pm go to [email or web address] - password [email or web address] [facility]  Office  [phone]  CC: Primary care physician; [name], MD

## 2016-10-23 ENCOUNTER — Telehealth: Payer: Self-pay | Admitting: *Deleted

## 2016-10-23 LAB — GLUCOSE, CAPILLARY
Glucose-Capillary: 153 mg/dL — ABNORMAL HIGH (ref 65–99)
Glucose-Capillary: 192 mg/dL — ABNORMAL HIGH (ref 65–99)

## 2016-10-23 MED ORDER — ACETAMINOPHEN 325 MG PO TABS: 650.0000 mg | ORAL_TABLET | Freq: Four times a day (QID) | ORAL | Status: DC | PRN

## 2016-10-23 MED ORDER — ENSURE ENLIVE PO LIQD: 237.0000 mL | ORAL | 0 refills | Status: DC

## 2016-10-23 MED ORDER — HYDROCODONE-ACETAMINOPHEN 5-325 MG PO TABS: ORAL_TABLET | ORAL | 0 refills | Status: DC

## 2016-10-23 MED ORDER — DOCUSATE SODIUM 100 MG PO CAPS: 100.0000 mg | ORAL_CAPSULE | Freq: Two times a day (BID) | ORAL | 0 refills | Status: DC | PRN

## 2016-10-23 MED ORDER — PREMIER PROTEIN SHAKE: 11.0000 [oz_av] | Freq: Two times a day (BID) | ORAL | 0 refills | Status: DC

## 2016-10-23 MED ORDER — AMOXICILLIN-POT CLAVULANATE 875-125 MG PO TABS: 1.0000 | ORAL_TABLET | Freq: Two times a day (BID) | ORAL | 0 refills | Status: DC

## 2016-10-23 NOTE — Discharge Summary (Signed)
[facility] [place] NAME: Henry Lindsey    MR#:  [id number]  DATE OF BIRTH:  81-Jul-1934  DATE OF ADMISSION:  10/20/2016 ADMITTING PHYSICIAN: Harrie Foreman, MD  DATE OF DISCHARGE: 10/23/16 PRIMARY CARE PHYSICIAN: [name], MD    ADMISSION DIAGNOSIS:  Right upper quadrant abdominal pain [R10.11] Sepsis, due to unspecified organism ([place]) [A41.9]  DISCHARGE DIAGNOSIS:  Active Problems:   Sepsis ([place])   Right upper quadrant abdominal pain  Acute cholecystitis SECONDARY DIAGNOSIS:   Past Medical History:  Diagnosis Date  . BPH (benign prostatic hyperplasia)   . Cancer ([place])   . Carotid artery stenosis 11/2010   <50% bilaterally  . CVA (cerebral infarction) 11/2010   r thalamic lacunar  . Diabetes mellitus   . Hyperlipidemia   . Hypertension   . Neuromuscular disorder ([place])   . Peripheral vascular disease in diabetes mellitus ([place]) 06/2012    95% occlusion s/p PTCA R SFA [place] Sept 2013    HOSPITAL COURSE:  Brief history and physical: The patient with past medical history of recent cholecystitis presents emergency department complaining of abdominal pain. The patient states that his right upper quadrant is exquisitely tender. He had a percutaneous drain placed a few weeks ago. Since that time he has complained of subjective fever as well as decreased appetite and intermittent nausea. In the emergency department the patient was found to meet septic criteria. After consultation with his surgeons at [facility] he would not to require surgical intervention which prompted the emergency department staff to call the hospitalist service for admission. Please review history and physical for complete details  Hospital course  1. Sepsis: The patient meets criteria via tachycardia and leukocytosis. He is hemodynamically stable. Improved with Zosyn. Discontinue Zosyn as patient is clinically improving and started by  mouth Augmentin Blood cultures no growth in less than 12 hours  urine culture Less than 10,000 colonies, insignificant growth  2. Acute right upper quadrant abdominal pain secondary to acute cholecystitis: Status post cholecystostomy tube 2 weeks ago and status post antibiotics Cipro and Flagyl treatment. Completed the course Clinically Improving; gallbladder decompressed with only residual evidence of inflammation. Surgery is recommending no new procedures at this time. Follow-up with surgery, patient prefers following up with Dr. Hampton Abbot as an OP. Discharge patient with cholecystostomy tube and drain, care by surgery recommendations Change IV Zosyn to by mouth Augmentin  Advanced diet as tolerated to low-fat , patient tolerated well MiraLAX for stool burden in the colon-revealed on CT scan. Patient had good bowel movements and Pain management as needed  3. Diabetes mellitus type 2: Held mixed insulin as well as oral hypoglycemic agents during hospital course and providedher insulin as well as sliding scale insulin. Resume home dose insulin at the time of discharge   4. Essential hypertension: Controlled; continue metoprolol  5. BPH: Continue Flomax and finasteride  6. DVT prophylaxis: Heparin subcutaneous given during the hospital course  7. GI prophylaxis: Pantoprazole per home regimen   DISCHARGE CONDITIONS:   Stable   CONSULTS OBTAINED:     PROCEDURES  None   DRUG ALLERGIES:  No Known Allergies  DISCHARGE MEDICATIONS:   Current Discharge Medication List    START taking these medications   Details  acetaminophen (TYLENOL) 325 MG tablet Take 2 tablets (650 mg total) by mouth every 6 (six) hours as needed for mild pain (or Fever >/= 101).    amoxicillin-clavulanate (AUGMENTIN) 875-125 MG  tablet Take 1 tablet by mouth every 12 (twelve) hours. Qty: 24 tablet, Refills: 0    docusate sodium (COLACE) 100 MG capsule Take 1 capsule (100 mg total) by mouth 2 (two)  times daily as needed for mild constipation. Qty: 10 capsule, Refills: 0    feeding supplement, ENSURE ENLIVE, (ENSURE ENLIVE) LIQD Take 237 mLs by mouth daily. Qty: 30 Bottle, Refills: 0    protein supplement shake (PREMIER PROTEIN) LIQD Take 325 mLs (11 oz total) by mouth 2 (two) times daily between meals. Qty: 30 Can, Refills: 0      CONTINUE these medications which have CHANGED   Details  HYDROcodone-acetaminophen (NORCO/VICODIN) 5-325 MG tablet One tablet two times daily as needed for knee pain Qty: 30 tablet, Refills: 0      CONTINUE these medications which have NOT CHANGED   Details  Alum & Mag Hydroxide-Simeth (MAGIC MOUTHWASH) SOLN Take 5 mLs by mouth 3 (three) times daily as needed for mouth pain.    aspirin 325 MG tablet Take 325 mg by mouth daily.    atorvastatin (LIPITOR) 40 MG tablet TAKE 1 TABLET BY MOUTH EVERY DAY Qty: 90 tablet, Refills: 2    Blood Glucose Monitoring Suppl (ONE TOUCH ULTRA SYSTEM KIT) w/Device KIT 1 kit by Does not apply route once. Use DX code E11.59 Qty: 1 each, Refills: 0    cilostazol (PLETAL) 100 MG tablet TAKE 1 TABLET(100 MG) BY MOUTH TWICE DAILY Qty: 60 tablet, Refills: 3    cyclobenzaprine (FLEXERIL) 5 MG tablet Take 1 tablet (5 mg total) by mouth 3 (three) times daily as needed for muscle spasms. Qty: 90 tablet, Refills: 3    ERIVEDGE 150 MG capsule Take 1 tablet by mouth daily.    famotidine (PEPCID) 20 MG tablet TAKE 1 TABLET(20 MG) BY MOUTH TWICE DAILY Qty: 60 tablet, Refills: 5    fenofibrate micronized (LOFIBRA) 134 MG capsule TAKE 1 CAPSULE BY MOUTH EVERY MORNING BEFORE BREAKFAST Qty: 30 capsule, Refills: 0    finasteride (PROSCAR) 5 MG tablet Take 1 tablet (5 mg total) by mouth daily. Qty: 30 tablet, Refills: 2    gabapentin (NEURONTIN) 400 MG capsule TAKE 1 CAPSULE(400 MG) BY MOUTH THREE TIMES DAILY Qty: 120 capsule, Refills: 2    glipiZIDE (GLUCOTROL) 10 MG tablet TAKE 1 TABLET(10 MG) BY MOUTH TWICE DAILY Qty: 60  tablet, Refills: 4    glucose blood test strip Use as instructed three times daily Qty: 100 each, Refills: 12    INS SYRINGE/NEEDLE 1CC/28G (B-D INSULIN SYRINGE 1CC/28G) 28G X 1/2" 1 ML MISC USE TO ADMINISTER INSULIN DAILY Qty: 100 each, Refills: 0    insulin lispro protamine-lispro (HUMALOG MIX 75/25) (75-25) 100 UNIT/ML SUSP injection Inject 10 units right before  breakfast and 6 units before dinner Qty: 10 mL, Refills: 5    INSULIN SYRINGE .5CC/29G 29G X 1/2" 0.5 ML MISC 1 Syringe by Does not apply route 3 (three) times daily. Qty: 100 each, Refills: 6   Associated Diagnoses: Diabetes mellitus    Insulin Syringe-Needle U-100 (INSULIN SYRINGE .5CC/31GX5/16") 31G X 5/16" 0.5 ML MISC USE THREE TIMES DAILY Qty: 100 each, Refills: 5    JANUVIA 100 MG tablet TAKE 1 TABLET(100 MG) BY MOUTH DAILY. NOTE DOSE INCREASE Qty: 30 tablet, Refills: 3    latanoprost (XALATAN) 0.005 % ophthalmic solution INSTILL 1 DROP INTO BOTH EYES QD Refills: 5    metFORMIN (GLUCOPHAGE) 1000 MG tablet TAKE 1 TABLET BY MOUTH TWICE DAILY Qty: 180 tablet, Refills: 1  metoprolol succinate (TOPROL-XL) 100 MG 24 hr tablet TAKE 1 TABLET BY MOUTH ONCE DAILY Qty: 30 tablet, Refills: 5    metroNIDAZOLE (FLAGYL) 500 MG tablet Take 1 tablet (500 mg total) by mouth every 8 (eight) hours. Qty: 33 tablet, Refills: 0    nitroGLYCERIN (NITROSTAT) 0.4 MG SL tablet Place 1 tablet (0.4 mg total) under the tongue every 5 (five) minutes as needed for chest pain. MAXIMUM 3 TABLETS Qty: 50 tablet, Refills: 3    ondansetron (ZOFRAN) 4 MG tablet Take 1 tablet (4 mg total) by mouth every 8 (eight) hours as needed for nausea or vomiting. Qty: 30 tablet, Refills: 3    ONETOUCH DELICA LANCETS 59Y MISC Use three times daily to check blood sugar. Qty: 100 each, Refills: 11    pantoprazole (PROTONIX) 40 MG tablet Take 1 tablet (40 mg total) by mouth 2 (two) times daily before a meal. Qty: 60 tablet, Refills: 0    tamsulosin  (FLOMAX) 0.4 MG CAPS capsule Take 0.4 mg by mouth daily after breakfast.    traMADol (ULTRAM) 50 MG tablet Take 50 mg by mouth every 6 (six) hours as needed.       STOP taking these medications     ciprofloxacin (CIPRO) 500 MG tablet      aspirin EC 81 MG tablet          DISCHARGE INSTRUCTIONS:   Follow-up with primary care physician in a week Follow-up with surgery Dr. Hampton Abbot in 10 days, continue cholecystostomy tube and drain care as per surgery recommendations    DIET:  Diabetic diet, cardiac   DISCHARGE CONDITION:  Stable  ACTIVITY:  Activity as tolerated  OXYGEN:  Home Oxygen: No.   Oxygen Delivery: room air  DISCHARGE LOCATION:  home   If you experience worsening of your admission symptoms, develop shortness of breath, life threatening emergency, suicidal or homicidal thoughts you must seek medical attention immediately by calling 911 or calling your MD immediately  if symptoms less severe.  You Must read complete instructions/literature along with all the possible adverse reactions/side effects for all the Medicines you take and that have been prescribed to you. Take any new Medicines after you have completely understood and accpet all the possible adverse reactions/side effects.   Please note  You were cared for by a hospitalist during your hospital stay. If you have any questions about your discharge medications or the care you received while you were in the hospital after you are discharged, you can call the unit and asked to speak with the hospitalist on call if the hospitalist that took care of you is not available. Once you are discharged, your primary care physician will handle any further medical issues. Please note that NO REFILLS for any discharge medications will be authorized once you are discharged, as it is imperative that you return to your primary care physician (or establish a relationship with a primary care physician if you do not have one) for  your aftercare needs so that they can reassess your need for medications and monitor your lab values.     Today  Chief Complaint  Patient presents with  . Abdominal Pain   Patient is doing fine. Pain is well controlled. Feels comfortable to go home. Wants to see Dr. Hampton Abbot as an outpatient  ROS:  CONSTITUTIONAL: Denies fevers, chills. Denies any fatigue, weakness.  EYES: Denies blurry vision, double vision, eye pain. EARS, NOSE, THROAT: Denies tinnitus, ear pain, hearing loss. RESPIRATORY: Denies cough, wheeze, shortness  of breath.  CARDIOVASCULAR: Denies chest pain, palpitations, edema.  GASTROINTESTINAL: right-sided cholecystostomy tube is intact with drain Denies nausea, vomiting, diarrhea, abdominal pain. Denies bright red blood per rectum. GENITOURINARY: Denies dysuria, hematuria. ENDOCRINE: Denies nocturia or thyroid problems. HEMATOLOGIC AND LYMPHATIC: Denies easy bruising or bleeding. SKIN: Denies rash or lesion. MUSCULOSKELETAL: Denies pain in neck, back, shoulder, knees, hips or arthritic symptoms.  NEUROLOGIC: Denies paralysis, paresthesias.  PSYCHIATRIC: Denies anxiety or depressive symptoms.   VITAL SIGNS:  Blood pressure 138/64, pulse 87, temperature 98.3 F (36.8 C), temperature source Oral, resp. rate 16, height 5' 11" (1.803 m), weight 84.4 kg (186 lb), SpO2 95 %.  I/O:    Intake/Output Summary (Last 24 hours) at 10/23/16 1247 Last data filed at 10/23/16 1005  Gross per 24 hour  Intake             1197 ml  Output             1585 ml  Net             -388 ml    PHYSICAL EXAMINATION:  GENERAL:  83 y.o.-year-old patient lying in the bed with no acute distress.  EYES: Pupils equal, round, reactive to light and accommodation. No scleral icterus. Extraocular muscles intact.  HEENT: Head atraumatic, normocephalic. Oropharynx and nasopharynx clear.  NECK:  Supple, no jugular venous distention. No thyroid enlargement, no tenderness.  LUNGS: Normal breath  sounds bilaterally, no wheezing, rales,rhonchi or crepitation. No use of accessory muscles of respiration.  CARDIOVASCULAR: S1, S2 normal. No murmurs, rubs, or gallops.  ABDOMEN: Soft, non-tender, non-distended. Bowel sounds present. No organomegaly or mass. Right upper quadrant with cholecystostomy tube and drain with light brown liquid  EXTREMITIES: No pedal edema, cyanosis, or clubbing.  NEUROLOGIC: Cranial nerves II through XII are intact. Muscle strength 5/5 in all extremities. Sensation intact. Gait not checked.  PSYCHIATRIC: The patient is alert and oriented x 3.  SKIN: No obvious rash, lesion, or ulcer.   DATA REVIEW:   CBC  Recent Labs Lab 10/22/16 0521  WBC 6.6  HGB 10.9*  HCT 31.5*  PLT 410    Chemistries   Recent Labs Lab 10/21/16 1733  NA 136  K 3.7  CL 111  CO2 21*  GLUCOSE 149*  BUN 22*  CREATININE 1.11  CALCIUM 7.9*  AST 19  ALT 18  ALKPHOS 62  BILITOT 0.6    Cardiac Enzymes  Recent Labs Lab 10/20/16 2159  TROPONINI <0.03    Microbiology Results  Results for orders placed or performed during the hospital encounter of 10/20/16  Culture, blood (Routine X 2) w Reflex to ID Panel     Status: None (Preliminary result)   Collection Time: 10/20/16 11:01 PM  Result Value Ref Range Status   Specimen Description Peripheral  Final   Special Requests NONE  Final   Culture NO GROWTH 3 DAYS  Final   Report Status PENDING  Incomplete  Culture, blood (Routine X 2) w Reflex to ID Panel     Status: None (Preliminary result)   Collection Time: 10/21/16 12:36 AM  Result Value Ref Range Status   Specimen Description Peripheral  Final   Special Requests NONE  Final   Culture NO GROWTH 2 DAYS  Final   Report Status PENDING  Incomplete  Urine culture     Status: Abnormal   Collection Time: 10/21/16 12:36 AM  Result Value Ref Range Status   Specimen Description Urine  Final   Special Requests  NONE  Final   Culture (A)  Final    <10,000 COLONIES/mL  INSIGNIFICANT GROWTH Performed at [facility]    Report Status 10/22/2016 FINAL  Final    RADIOLOGY:  Dg Abdomen 1 View  Result Date: 10/20/2016 CLINICAL DATA:  Right upper quadrant pain EXAM: ABDOMEN - 1 VIEW COMPARISON:  MRI 10/07/2016, 10/06/2016, CT 10/05/2016 FINDINGS: There is a nonobstructed bowel gas pattern. There is a drainage catheter in the right upper quadrant of the abdomen. No pathologic calcifications are visualized. IMPRESSION: Nonobstructed bowel-gas pattern. Drainage catheter present in the right upper quadrant. Electronically Signed   By: Donavan Foil M.D.   On: 10/20/2016 22:33   Ct Abdomen Pelvis W Contrast  Result Date: 10/21/2016 CLINICAL DATA:  Right upper quadrant pain, status post cholecystostomy EXAM: CT ABDOMEN AND PELVIS WITH CONTRAST TECHNIQUE: Multidetector CT imaging of the abdomen and pelvis was performed using the standard protocol following bolus administration of intravenous contrast. CONTRAST:  79m ISOVUE-300 IOPAMIDOL (ISOVUE-300) INJECTION 61%, 1 ISOVUE-300 IOPAMIDOL (ISOVUE-300) INJECTION 61% COMPARISON:  Radiograph 10/20/2016, CT 10/05/2016, 04/10/2016, MRI 12 18,017 FINDINGS: Lower chest: Linear scar or atelectasis in the right lower lobe with calcified granuloma present. No acute infiltrate or pleural effusion is visualized. Coronary artery calcifications are present. Heart size within normal limits. No pericardial effusion. Hepatobiliary: Diffuse fatty infiltration of the liver. Previously noted mild intra hepatic biliary dilatation is no longer identified. Interim placement of a drainage catheter within the gallbladder fossa with decompression of the gallbladder. There is mild edema and inflammatory change around the catheter tract and the decompressed gallbladder. The previously noted small stones within the distal common bowel duct on the prior CT are not confidently identified on the current exam. There are other scattered calcifications at the  pancreas which may be vascular in the etiology or possibly related to chronic pancreatitis. Pancreas: No peripancreatic inflammation is present. Spleen: Normal in size without focal abnormality. Adrenals/Urinary Tract: Adrenal glands are within normal limits. Kidneys show no hydronephrosis. Multiple hypodense renal lesions as before. Bladder unremarkable. Stomach/Bowel: Stomach is nonenlarged. No dilated small bowel. No colon wall thickening. Sigmoid diverticular disease without acute inflammation. Vascular/Lymphatic: Densely calcified aorta. No significantly enlarged lymph nodes. Reproductive: Prostate gland is slightly enlarged. Other: No free air or free fluid. Musculoskeletal: Stable skeletal structures with marked multilevel degenerative changes of the spine as well as anterolisthesis of L4 on L5. Lucent inferior endplate lesion in L5 is unchanged. IMPRESSION: 1. Right upper quadrant drainage catheter present within the gallbladder fossa. The gallbladder is decompressed. Mild residual edema and soft tissue stranding in the right upper quadrant and surrounding the catheter suggests mild residual inflammatory changes. Previously noted intra hepatic biliary dilatation has resolved. Small calcifications that were previously noted in the region of the common bile duct at the head of the pancreas are not identified on the current exam. 2. Sigmoid colon diverticular disease without acute inflammation Electronically Signed   By: KDonavan FoilM.D.   On: 10/21/2016 01:32    EKG:   Orders placed or performed during the hospital encounter of 10/20/16  . ED EKG  . ED EKG  . EKG 12-Lead  . EKG 12-Lead  . EKG 12-Lead  . EKG 12-Lead  . EKG 12-Lead  . EKG 12-Lead  . EKG 12-Lead  . EKG 12-Lead  . EKG 12-Lead  . EKG 12-Lead  . EKG 12-Lead  . EKG 12-Lead      Management plans discussed with the patient, family and they are  in agreement.  CODE STATUS:     Code Status Orders        Start     Ordered    10/21/16 0320  Full code  Continuous     10/21/16 0319    Code Status History    Date Active Date Inactive Code Status Order ID Comments User Context   10/05/2016 12:34 PM 10/08/2016  6:10 PM Full Code [id number]  Rondel Jumbo, PA-C Inpatient    Advance Directive Documentation   Flowsheet Row Most Recent Value  Type of Advance Directive  Living will  Pre-existing out of facility DNR order (yellow form or pink MOST form)  No data  "MOST" Form in Place?  No data      TOTAL TIME TAKING CARE OF THIS PATIENT: 45  minutes.   Note: This dictation was prepared with Dragon dictation along with smaller phrase technology. Any transcriptional errors that result from this process are unintentional.   _0 @  on 10/23/2016 at 12:47 PM  Between 7am to 6pm - Pager - [phone]  After 6pm go to [email or web address] - password [email or web address]  Office  [phone]  CC: Primary care physician; [name], MD

## 2016-10-23 NOTE — Telephone Encounter (Signed)
[facility]  Requested if care can be presumed following patient being discharged from hospital on 01/3 Contact [phone]

## 2016-10-23 NOTE — Telephone Encounter (Signed)
Verbal consent given to Henry Lindsey

## 2016-10-23 NOTE — Discharge Instructions (Signed)
Follow-up with primary care physician in a week Follow-up with surgery Dr. Hampton Abbot in 10 days, continue cholecystostomy tube and drain care as per surgery recommendations

## 2016-10-23 NOTE — Progress Notes (Signed)
10/23/2016 2:27 PM  [name] to be D/C'd Home per MD order.  Discussed prescriptions and follow up appointments with the patient. Prescriptions given to patient, medication list explained in detail. Pt verbalized understanding.  Allergies as of 10/23/2016   No Known Allergies     Medication List    STOP taking these medications   ciprofloxacin 500 MG tablet Commonly known as:  CIPRO     TAKE these medications   acetaminophen 325 MG tablet Commonly known as:  TYLENOL Take 2 tablets (650 mg total) by mouth every 6 (six) hours as needed for mild pain (or Fever >/= 101).   amoxicillin-clavulanate 875-125 MG tablet Commonly known as:  AUGMENTIN Take 1 tablet by mouth every 12 (twelve) hours.   aspirin 325 MG tablet Take 325 mg by mouth daily.   atorvastatin 40 MG tablet Commonly known as:  LIPITOR TAKE 1 TABLET BY MOUTH EVERY DAY   cilostazol 100 MG tablet Commonly known as:  PLETAL TAKE 1 TABLET(100 MG) BY MOUTH TWICE DAILY   cyclobenzaprine 5 MG tablet Commonly known as:  FLEXERIL Take 1 tablet (5 mg total) by mouth 3 (three) times daily as needed for muscle spasms.   docusate sodium 100 MG capsule Commonly known as:  COLACE Take 1 capsule (100 mg total) by mouth 2 (two) times daily as needed for mild constipation.   ERIVEDGE 150 MG capsule Generic drug:  vismodegib Take 1 tablet by mouth daily.   famotidine 20 MG tablet Commonly known as:  PEPCID TAKE 1 TABLET(20 MG) BY MOUTH TWICE DAILY   feeding supplement (ENSURE ENLIVE) Liqd Take 237 mLs by mouth daily.   fenofibrate micronized 134 MG capsule Commonly known as:  LOFIBRA TAKE 1 CAPSULE BY MOUTH EVERY MORNING BEFORE BREAKFAST   finasteride 5 MG tablet Commonly known as:  PROSCAR Take 1 tablet (5 mg total) by mouth daily.   gabapentin 400 MG capsule Commonly known as:  NEURONTIN TAKE 1 CAPSULE(400 MG) BY MOUTH THREE TIMES DAILY   glipiZIDE 10 MG tablet Commonly known as:  GLUCOTROL TAKE 1 TABLET(10  MG) BY MOUTH TWICE DAILY   glucose blood test strip Use as instructed three times daily   HYDROcodone-acetaminophen 5-325 MG tablet Commonly known as:  NORCO/VICODIN One tablet two times daily as needed for knee pain   insulin lispro protamine-lispro (75-25) 100 UNIT/ML Susp injection Commonly known as:  HUMALOG MIX 75/25 Inject 10 units right before  breakfast and 6 units before dinner   INSULIN SYRINGE .5CC/29G 29G X 1/2" 0.5 ML Misc 1 Syringe by Does not apply route 3 (three) times daily.   INSULIN SYRINGE .5CC/31GX5/16" 31G X 5/16" 0.5 ML Misc USE THREE TIMES DAILY   INS SYRINGE/NEEDLE 1CC/28G 28G X 1/2" 1 ML Misc Commonly known as:  B-D INSULIN SYRINGE 1CC/28G USE TO ADMINISTER INSULIN DAILY   JANUVIA 100 MG tablet Generic drug:  sitaGLIPtin TAKE 1 TABLET(100 MG) BY MOUTH DAILY. NOTE DOSE INCREASE   latanoprost 0.005 % ophthalmic solution Commonly known as:  XALATAN INSTILL 1 DROP INTO BOTH EYES QD   magic mouthwash Soln Take 5 mLs by mouth 3 (three) times daily as needed for mouth pain.   metFORMIN 1000 MG tablet Commonly known as:  GLUCOPHAGE TAKE 1 TABLET BY MOUTH TWICE DAILY   metoprolol succinate 100 MG 24 hr tablet Commonly known as:  TOPROL-XL TAKE 1 TABLET BY MOUTH ONCE DAILY   metroNIDAZOLE 500 MG tablet Commonly known as:  FLAGYL Take 1 tablet (500 mg  total) by mouth every 8 (eight) hours.   nitroGLYCERIN 0.4 MG SL tablet Commonly known as:  NITROSTAT Place 1 tablet (0.4 mg total) under the tongue every 5 (five) minutes as needed for chest pain. MAXIMUM 3 TABLETS   ondansetron 4 MG tablet Commonly known as:  ZOFRAN Take 1 tablet (4 mg total) by mouth every 8 (eight) hours as needed for nausea or vomiting.   ONE TOUCH ULTRA SYSTEM KIT w/Device Kit 1 kit by Does not apply route once. Use DX code Y19.50   [facility] DELICA LANCETS 93O Misc Use three times daily to check blood sugar.   pantoprazole 40 MG tablet Commonly known as:  PROTONIX Take 1  tablet (40 mg total) by mouth 2 (two) times daily before a meal.   protein supplement shake Liqd Commonly known as:  PREMIER PROTEIN Take 325 mLs (11 oz total) by mouth 2 (two) times daily between meals.   tamsulosin 0.4 MG Caps capsule Commonly known as:  FLOMAX Take 0.4 mg by mouth daily after breakfast.   traMADol 50 MG tablet Commonly known as:  ULTRAM Take 50 mg by mouth every 6 (six) hours as needed.       Vitals:   10/22/16 2012 10/23/16 0510  BP: 131/61 138/64  Pulse: 81 87  Resp: 16 16  Temp: 97.7 F (36.5 C) 98.3 F (36.8 C)    Skin clean, dry and intact without evidence of skin break down, no evidence of skin tears noted. IV catheter discontinued intact. Site without signs and symptoms of complications. Dressing and pressure applied. Pt denies pain at this time. No complaints noted.  An After Visit Summary was printed and given to the patient. Patient escorted via [place], and D/C home via private auto.  Henry Lindsey

## 2016-10-23 NOTE — Telephone Encounter (Signed)
Yes please give verbal order to resume care

## 2016-10-23 NOTE — Care Management Important Message (Signed)
Important Message  Patient Details  Name: Henry Lindsey MRN: [id number] Date of Birth: 1933/08/31   Medicare Important Message Given:  N/A - LOS <3 / Initial given by admissions    Beverly Sessions, RN 10/23/2016, 10:54 AM

## 2016-10-24 ENCOUNTER — Telehealth: Payer: Self-pay

## 2016-10-24 ENCOUNTER — Telehealth: Payer: Self-pay | Admitting: Cardiovascular Disease

## 2016-10-24 ENCOUNTER — Other Ambulatory Visit: Payer: Self-pay

## 2016-10-24 NOTE — Telephone Encounter (Signed)
Call was made to patient at this time after speaking with Dr. Hampton Abbot. He has ordered T-Tube Cholangiogram and cardiology clearance prior to patient being seen. Clearance will be needed for Cholecystectomy to be scheduled. Patient states that his Cardiologist is Dr. Rockey Situ.  IR form faxed to specialty scheduling at this time. Awaiting return call to get this scheduled.  Call made to [place] at this time. He is scheduled with Dr. Rockey Situ for 11/04/16 at 1140am- arrive at 1130am. Clearance Faxed to their office.

## 2016-10-24 NOTE — Telephone Encounter (Signed)
Received cardiac clearance request for pt to proceed w/ cholecystectomy w/ Dr. Hampton Abbot. DOS has not been scheduled yet pending this clearance.  Please route to [place] @ [phone]

## 2016-10-25 ENCOUNTER — Other Ambulatory Visit: Payer: Self-pay | Admitting: Surgery

## 2016-10-25 ENCOUNTER — Telehealth: Payer: Self-pay | Admitting: Internal Medicine

## 2016-10-25 DIAGNOSIS — K810 Acute cholecystitis: Secondary | ICD-10-CM

## 2016-10-25 DIAGNOSIS — Z434 Encounter for attention to other artificial openings of digestive tract: Secondary | ICD-10-CM

## 2016-10-25 LAB — CULTURE, BLOOD (ROUTINE X 2): Culture: NO GROWTH

## 2016-10-25 NOTE — Telephone Encounter (Signed)
Transition Care Management Follow-up Telephone Call  How have you been since you were released from the hospital? Patient states he is feeling well right now.   Do you understand why you were in the hospital? Yes, I had an infection.   Do you understand the discharge instrcutions? Yes,  Items Reviewed:  Medications reviewed:Yes,   Allergies reviewed: Yes  Dietary changes reviewed: Yes  Referrals reviewed: Yes   Functional Questionnaire:   Activities of Daily Living (ADLs):   He states they are independent in the following:  No problems with ADLS States they require assistance with the following: No assist needed.   Any transportation issues/concerns?: NO   A ny patient concerns? No not at this time.   Confirmed importance and date/time of follow-up visits scheduled: Yes  Confirmed with patient if condition begins to worsen call PCP or go to the ER.  Patient was given the Call-a-Nurse line [phone]: Yes

## 2016-10-26 LAB — CULTURE, BLOOD (ROUTINE X 2): CULTURE: NO GROWTH

## 2016-10-28 NOTE — Telephone Encounter (Signed)
It would appear stress test is scheduled He should follow-up in clinic to discuss results, get cardiac clearance

## 2016-10-28 NOTE — Telephone Encounter (Signed)
Call made to specialty scheduling at this time. No answer. Left voicemail for return phone call.

## 2016-10-29 ENCOUNTER — Ambulatory Visit
Admission: RE | Admit: 2016-10-29 | Discharge: 2016-10-29 | Disposition: A | Discharge status: Home / Self Care | Payer: Medicare Other | Source: Ambulatory Visit | Attending: Surgery | Admitting: Surgery

## 2016-10-29 DIAGNOSIS — K810 Acute cholecystitis: Secondary | ICD-10-CM | POA: Diagnosis present

## 2016-10-29 MED ORDER — SODIUM CHLORIDE 0.9 % IJ SOLN
20.0000 mL | INTRAMUSCULAR | Status: DC | PRN
  Administered 2016-10-29: 20 mL

## 2016-10-29 MED ORDER — IOPAMIDOL (ISOVUE-300) INJECTION 61%
50.0000 mL | Freq: Once | INTRAVENOUS | Status: AC | PRN
Start: 2016-10-29 — End: 2016-10-29
  Administered 2016-10-29: 10 mL

## 2016-10-29 NOTE — Telephone Encounter (Signed)
T-tube cholangiogram scheduled today. Will get in touch with patient as soon as results have been received.

## 2016-10-30 ENCOUNTER — Other Ambulatory Visit: Payer: Self-pay

## 2016-10-31 ENCOUNTER — Encounter: Payer: Self-pay | Admitting: Internal Medicine

## 2016-10-31 ENCOUNTER — Ambulatory Visit (INDEPENDENT_AMBULATORY_CARE_PROVIDER_SITE_OTHER): Payer: Medicare Other | Admitting: Internal Medicine

## 2016-10-31 ENCOUNTER — Telehealth: Payer: Self-pay

## 2016-10-31 VITALS — BP 116/70 | HR 93 | Temp 97.8°F | Resp 17 | Ht 71.0 in | Wt 176.0 lb

## 2016-10-31 DIAGNOSIS — E663 Overweight: Secondary | ICD-10-CM

## 2016-10-31 DIAGNOSIS — D649 Anemia, unspecified: Secondary | ICD-10-CM | POA: Diagnosis not present

## 2016-10-31 DIAGNOSIS — K810 Acute cholecystitis: Secondary | ICD-10-CM

## 2016-10-31 DIAGNOSIS — E1165 Type 2 diabetes mellitus with hyperglycemia: Secondary | ICD-10-CM

## 2016-10-31 DIAGNOSIS — A419 Sepsis, unspecified organism: Secondary | ICD-10-CM | POA: Diagnosis not present

## 2016-10-31 DIAGNOSIS — F1190 Opioid use, unspecified, uncomplicated: Secondary | ICD-10-CM | POA: Diagnosis not present

## 2016-10-31 DIAGNOSIS — K819 Cholecystitis, unspecified: Secondary | ICD-10-CM

## 2016-10-31 DIAGNOSIS — Z794 Long term (current) use of insulin: Secondary | ICD-10-CM

## 2016-10-31 DIAGNOSIS — E1121 Type 2 diabetes mellitus with diabetic nephropathy: Secondary | ICD-10-CM

## 2016-10-31 DIAGNOSIS — IMO0002 Reserved for concepts with insufficient information to code with codable children: Secondary | ICD-10-CM

## 2016-10-31 LAB — C-REACTIVE PROTEIN: CRP: 0.2 mg/dL — AB (ref 0.5–20.0)

## 2016-10-31 LAB — IBC PANEL
Iron: 66 ug/dL (ref 42–165)
SATURATION RATIOS: 13.1 % — AB (ref 20.0–50.0)
Transferrin: 360 mg/dL (ref 212.0–360.0)

## 2016-10-31 LAB — COMPREHENSIVE METABOLIC PANEL
ALK PHOS: 65 U/L (ref 39–117)
ALT: 17 U/L (ref 0–53)
AST: 16 U/L (ref 0–37)
Albumin: 4.2 g/dL (ref 3.5–5.2)
BILIRUBIN TOTAL: 0.7 mg/dL (ref 0.2–1.2)
BUN: 22 mg/dL (ref 6–23)
CHLORIDE: 99 meq/L (ref 96–112)
CO2: 28 meq/L (ref 19–32)
Calcium: 9.5 mg/dL (ref 8.4–10.5)
Creatinine, Ser: 1.16 mg/dL (ref 0.40–1.50)
GFR: 64.14 mL/min (ref >60.00)
Glucose, Bld: 145 mg/dL — ABNORMAL HIGH (ref 70–99)
Potassium: 5.5 mEq/L — ABNORMAL HIGH (ref 3.5–5.1)
Sodium: 136 mEq/L (ref 135–145)
Total Protein: 6.9 g/dL (ref 6.0–8.3)

## 2016-10-31 LAB — CBC WITH DIFFERENTIAL/PLATELET
BASOS ABS: 0 10*3/uL (ref 0.0–0.1)
Basophils Relative: 0.4 % (ref 0.0–3.0)
EOS PCT: 5.1 % — AB (ref 0.0–5.0)
Eosinophils Absolute: 0.5 10*3/uL (ref 0.0–0.7)
HEMATOCRIT: 41.3 % (ref 39.0–52.0)
HEMOGLOBIN: 13.7 g/dL (ref 13.0–17.0)
LYMPHS ABS: 2.4 10*3/uL (ref 0.7–4.0)
LYMPHS PCT: 24.2 % (ref 12.0–46.0)
MCHC: 33.1 g/dL (ref 30.0–36.0)
MCV: 82.8 fl (ref 78.0–100.0)
MONOS PCT: 7.9 % (ref 3.0–12.0)
Monocytes Absolute: 0.8 10*3/uL (ref 0.1–1.0)
NEUTROS PCT: 62.4 % (ref 43.0–77.0)
Neutro Abs: 6.2 10*3/uL (ref 1.4–7.7)
Platelets: 331 10*3/uL (ref 150.0–400.0)
RBC: 4.99 Mil/uL (ref 4.22–5.81)
RDW: 14.5 % (ref 11.5–15.5)
WBC: 10 10*3/uL (ref 4.0–10.5)

## 2016-10-31 LAB — FERRITIN: FERRITIN: 28.4 ng/mL (ref 22.0–322.0)

## 2016-10-31 MED ORDER — HYDROCODONE-ACETAMINOPHEN 5-325 MG PO TABS: ORAL_TABLET | ORAL | 0 refills | Status: DC

## 2016-10-31 NOTE — Telephone Encounter (Signed)
Refills for jan,  Feb and March for #60/minth (maxiumum use 2 daily) printed and signed.  Refill history confirmed via [place] Controlled Substance databas, accessed by me today.[name]

## 2016-10-31 NOTE — Telephone Encounter (Signed)
Medications placed at front desk patient notified.

## 2016-10-31 NOTE — Telephone Encounter (Signed)
Patient is wanting a refill on his Henry Lindsey it was DC during his hospital visit 10/20/2016. Please advise. Was seen today 10/31/2016

## 2016-10-31 NOTE — Progress Notes (Signed)
Subjective:  Patient ID: Henry Lindsey, male    DOB: January 07, 1933  Age: 83 y.o. MRN: [id number]  CC: The primary encounter diagnosis was Cholecystitis, acute. Diagnoses of Anemia, unspecified type, Sepsis, due to unspecified organism [facility]), Narcotic drug use, Overweight (BMI 25.0-29.9), Cholecystitis, and Uncontrolled type 2 diabetes mellitus with diabetic nephropathy, with long-term current use of insulin ([place]) were also pertinent to this visit.  HPI Henry Lindsey presents for hospital follow up.  He was readmitted to [facility] on Dec 31 with RUQ pain and sepsis syndrome after being discharged on December 19th with a percutaneously placed  cholecystostomy tube which was placed on Dec 17 during Dec 16 admission for cholecystitis. He was discharged on January 3 after blood cultures obtained at admission were negative and his RUQ pain improved.  He had been compliant with the antibiotics prescribed at both discharges.  Still having nausea from augmentin , but appeitte has improved and he denies abdominal pain , fevers,  And vomiting.   Bowels movementrs are regular and formed.    Sees general  surgeon at tomorrow  To discuss anticipated cholecystectomy .  He is emptying his drain 4 times daily  And approximates the output to be 300 ccs every 6 hours of bilious liquid   His blood sugar was managed with SSI during admission and he has resumed his home insulin regimen since discharge.  His Blood sugars have been < 120 fasting since discharge  Had one episode of low sugar,  Treated with fruit     Outpatient Medications Prior to Visit  Medication Sig Dispense Refill  . Alum & Mag Hydroxide-Simeth (MAGIC MOUTHWASH) SOLN Take 5 mLs by mouth 3 (three) times daily as needed for mouth pain.    [name] amLODipine (NORVASC) 10 MG tablet Take 10 mg by mouth daily.     [name] amoxicillin-clavulanate (AUGMENTIN) 875-125 MG tablet Take 1 tablet by mouth every 12 (twelve) hours. 24 tablet 0  . aspirin 325 MG tablet Take 325 mg by  mouth daily.    [name] atorvastatin (LIPITOR) 40 MG tablet TAKE 1 TABLET BY MOUTH EVERY DAY 90 tablet 2  . Blood Glucose Monitoring Suppl (ONE TOUCH ULTRA SYSTEM KIT) w/Device KIT 1 kit by Does not apply route once. Use DX code E11.59 1 each 0  . cilostazol (PLETAL) 100 MG tablet TAKE 1 TABLET(100 MG) BY MOUTH TWICE DAILY 60 tablet 3  . cyclobenzaprine (FLEXERIL) 5 MG tablet Take 1 tablet (5 mg total) by mouth 3 (three) times daily as needed for muscle spasms. (Patient not taking: Reported on 11/01/2016) 90 tablet 3  . famotidine (PEPCID) 20 MG tablet TAKE 1 TABLET(20 MG) BY MOUTH TWICE DAILY 60 tablet 5  . feeding supplement, ENSURE ENLIVE, (ENSURE ENLIVE) LIQD Take 237 mLs by mouth daily. (Patient taking differently: Take 237 mLs by mouth 2 (two) times daily. ) 30 Bottle 0  . fenofibrate micronized (LOFIBRA) 134 MG capsule TAKE 1 CAPSULE BY MOUTH EVERY MORNING BEFORE BREAKFAST 30 capsule 0  . finasteride (PROSCAR) 5 MG tablet Take 1 tablet (5 mg total) by mouth daily. 30 tablet 2  . gabapentin (NEURONTIN) 400 MG capsule TAKE 1 CAPSULE(400 MG) BY MOUTH THREE TIMES DAILY 120 capsule 2  . glipiZIDE (GLUCOTROL) 10 MG tablet TAKE 1 TABLET(10 MG) BY MOUTH TWICE DAILY 60 tablet 4  . glucose blood test strip Use as instructed three times daily 100 each 12  . INS SYRINGE/NEEDLE 1CC/28G (B-D INSULIN SYRINGE 1CC/28G) 28G X 1/2" 1 ML  MISC USE TO ADMINISTER INSULIN DAILY 100 each 0  . insulin lispro protamine-lispro (HUMALOG MIX 75/25) (75-25) 100 UNIT/ML SUSP injection Inject 10 units right before  breakfast and 6 units before dinner 10 mL 5  . JANUVIA 100 MG tablet TAKE 1 TABLET(100 MG) BY MOUTH DAILY. NOTE DOSE INCREASE 30 tablet 3  . latanoprost (XALATAN) 0.005 % ophthalmic solution INSTILL 1 DROP INTO BOTH EYES QD  5  . meloxicam (MOBIC) 15 MG tablet Take 15 mg by mouth daily.     . metFORMIN (GLUCOPHAGE) 1000 MG tablet TAKE 1 TABLET BY MOUTH TWICE DAILY 180 tablet 1  . metoprolol succinate (TOPROL-XL) 100 MG  24 hr tablet TAKE 1 TABLET BY MOUTH ONCE DAILY 30 tablet 5  . nitroGLYCERIN (NITROSTAT) 0.4 MG SL tablet Place 1 tablet (0.4 mg total) under the tongue every 5 (five) minutes as needed for chest pain. MAXIMUM 3 TABLETS 50 tablet 3  . ondansetron (ZOFRAN) 4 MG tablet Take 1 tablet (4 mg total) by mouth every 8 (eight) hours as needed for nausea or vomiting. 30 tablet 3  . ONETOUCH DELICA LANCETS 41P MISC Use three times daily to check blood sugar. 100 each 11  . pantoprazole (PROTONIX) 40 MG tablet Take 1 tablet (40 mg total) by mouth 2 (two) times daily before a meal. 60 tablet 0  . tamsulosin (FLOMAX) 0.4 MG CAPS capsule Take 0.4 mg by mouth daily after breakfast.    . traMADol (ULTRAM) 50 MG tablet Take 50 mg by mouth at bedtime.     . docusate sodium (COLACE) 100 MG capsule Take 1 capsule (100 mg total) by mouth 2 (two) times daily as needed for mild constipation. 10 capsule 0  . metroNIDAZOLE (FLAGYL) 500 MG tablet   0  . acetaminophen (TYLENOL) 325 MG tablet Take 2 tablets (650 mg total) by mouth every 6 (six) hours as needed for mild pain (or Fever >/= 101).    [name] ERIVEDGE 150 MG capsule     . HUMULIN N 100 UNIT/ML injection     . HYDROcodone-acetaminophen (NORCO/VICODIN) 5-325 MG tablet One tablet two times daily as needed for knee pain 30 tablet 0  . INSULIN SYRINGE .5CC/29G 29G X 1/2" 0.5 ML MISC 1 Syringe by Does not apply route 3 (three) times daily. 100 each 6  . Insulin Syringe-Needle U-100 (INSULIN SYRINGE .5CC/31GX5/16") 31G X 5/16" 0.5 ML MISC USE THREE TIMES DAILY 100 each 5  . protein supplement shake (PREMIER PROTEIN) LIQD Take 325 mLs (11 oz total) by mouth 2 (two) times daily between meals. 30 Can 0   No facility-administered medications prior to visit.     Review of Systems;  Patient denies headache, fevers, malaise, unintentional weight loss, skin rash, eye pain, sinus congestion and sinus pain, sore throat, dysphagia,  hemoptysis , cough, dyspnea, wheezing, chest pain,  palpitations, orthopnea, edema, abdominal pain, nausea, melena, diarrhea, constipation, flank pain, dysuria, hematuria, urinary  Frequency, nocturia, numbness, tingling, seizures,  Focal weakness, Loss of consciousness,  Tremor, insomnia, depression, anxiety, and suicidal ideation.      Objective:  BP 116/70   Pulse 93   Temp 97.8 F (36.6 C) (Oral)   Resp 17   Ht '5\' 11"'  (1.803 m)   Wt 176 lb (79.8 kg)   SpO2 96%   BMI 24.55 kg/m   BP Readings from Last 3 Encounters:  11/01/16 (!) 146/76  10/31/16 116/70  10/23/16 138/64    Wt Readings from Last 3 Encounters:  11/01/16 176  lb 6.4 oz (80 kg)  10/31/16 176 lb (79.8 kg)  10/23/16 186 lb (84.4 kg)    General appearance: alert, cooperative and appears stated age Ears: normal TM's and external ear canals both ears Throat: lips, mucosa, and tongue normal; teeth and gums normal Neck: no adenopathy, no carotid bruit, supple, symmetrical, trachea midline and thyroid not enlarged, symmetric, no tenderness/mass/nodules Back: symmetric, no curvature. ROM normal. No CVA tenderness. Lungs: clear to auscultation bilaterally Heart: regular rate and rhythm, S1, S2 normal, no murmur, click, rub or gallop Abdomen: soft, non-tender; bowel sounds normal; no masses,  no organomegaly Pulses: 2+ and symmetric Skin: Skin color, texture, turgor normal. No rashes or lesions Lymph nodes: Cervical, supraclavicular, and axillary nodes normal.  Lab Results  Component Value Date   HGBA1C 7.6 (H) 10/05/2016   HGBA1C 7.9 (H) 10/04/2016   HGBA1C 8.1 (H) 07/01/2016    Lab Results  Component Value Date   CREATININE 1.16 10/31/2016   CREATININE 1.11 10/21/2016   CREATININE 1.61 (H) 10/20/2016    Lab Results  Component Value Date   WBC 10.0 10/31/2016   HGB 13.7 10/31/2016   HCT 41.3 10/31/2016   PLT 331.0 10/31/2016   GLUCOSE 145 (H) 10/31/2016   CHOL 131 10/04/2016   TRIG 53.0 10/04/2016   HDL 58.70 10/04/2016   LDLDIRECT 62.0 10/04/2016    LDLCALC 61 10/04/2016   ALT 17 10/31/2016   AST 16 10/31/2016   NA 136 10/31/2016   K 5.5 (H) 10/31/2016   CL 99 10/31/2016   CREATININE 1.16 10/31/2016   BUN 22 10/31/2016   CO2 28 10/31/2016   TSH 2.943 10/20/2016   PSA 0.83 10/10/2014   INR 1.23 10/20/2016   HGBA1C 7.6 (H) 10/05/2016   MICROALBUR 1.7 10/04/2016    Dg Cholangiogram  Existing Tube  Result Date: 10/29/2016 INDICATION: Cholecystostomy tube placed on October 05, 2016 ; here for evaluation prior to cholecystectomy. EXAM: CATHETER CHOLANGIOGRAM MEDICATIONS: No antibiotics were employed ANESTHESIA/SEDATION: No conscious sedation was employed. FLUOROSCOPY TIME:  Fluoroscopy Time: 1 minutes 6 seconds (178 mGy). COMPLICATIONS: None immediate. PROCEDURE: The cholecystostomy tube was accessed with a luer lock syringe. Under fluoroscopic guidance a total of 10 cc of a solution of Isovue 300 and sterile saline 1:3 was instilled. The gallbladder was contracted with the catheter noted be intraluminal. No leakage of contrast outside the gallbladder was observed. There is was prompt filling of the cystic duct and common bile duct. The intrahepatic ducts partially filled. The contour of the common bile duct was normal with no filling defects observed. A tiny amount of drainage into the duodenum was observed at the conclusion of the study. 10 cc of contrast and bile were aspirated into the syringe at the conclusion of the study. The patient voiced no discomfort IMPRESSION: Injection of the cholecystostomy tube with filling of a portion of the gallbladder. The visualized portions of the gallbladder exhibit no inflammatory changes. Normal appearance of the cystic duct and common bile duct with no intraductal stones observed. Electronically Signed   By: David  Martinique M.D.   On: 10/29/2016 09:36    Assessment & Plan:   Problem List Items Addressed This Visit    Cholecystitis    Managed with percutaneously placed cholecystomy tube which is still  draining about 1200 ccs of bilious fluid daily per patient.   Plans for cholecystectomy are TBD pending follow up with general surgery       Narcotic drug use    He is requesting  refill of vicodin for management of chronic knee pain. Refill history confirmed via [place] Controlled Substance databas, accessed by me today.[name]      Overweight (BMI 25.0-29.9)    He has lost considerable weight dur to his recent admission for cholecystitis and loss of appetite.  Encouraged to maintain his current weight.  Body mass index is 24.55 kg/m.       Sepsis ([place])    Blood  cultures were negative and IV abx were changed to augmentin at discharge.  He has breen afebrile and denies diarrhea.       Uncontrolled diabetes mellitus with diabetic nephropathy, with long-term current use of insulin ([facility])    Recurrent lows were due to cholecystitis induced emesis  And have now resolved. Continue  glipizide 10 mg bid ,  insulin 75/25 10 units in the am and 6 units  pm,   and [place]    Lab Results  Component Value Date   HGBA1C 7.6 (H) 10/05/2016          Other Visit Diagnoses    Cholecystitis, acute    -  Primary   Relevant Orders   CBC with Differential/Platelet (Completed)   C-reactive protein (Completed)   Anemia, unspecified type       Relevant Orders   Comprehensive metabolic panel (Completed)   IBC panel (Completed)   Ferritin (Completed)      I have discontinued Mr. Zwahlen INSULIN SYRINGE .5CC/29G, INSULIN SYRINGE .5CC/31GX5/16", acetaminophen, docusate sodium, HYDROcodone-acetaminophen, protein supplement shake, HUMULIN N, and ERIVEDGE. I am also having him maintain his metoprolol succinate, traMADol, tamsulosin, magic mouthwash, finasteride, ONE TOUCH ULTRA SYSTEM KIT, ONETOUCH DELICA LANCETS 03E, latanoprost, INS SYRINGE/NEEDLE 1CC/28G, glipiZIDE, glucose blood, insulin lispro protamine-lispro, famotidine, atorvastatin, metFORMIN, JANUVIA, cilostazol, fenofibrate micronized, ondansetron,  gabapentin, cyclobenzaprine, pantoprazole, nitroGLYCERIN, aspirin, amoxicillin-clavulanate, feeding supplement (ENSURE ENLIVE), meloxicam, and amLODipine.  No orders of the defined types were placed in this encounter.   Medications Discontinued During This Encounter  Medication Reason  . HYDROcodone-acetaminophen (NORCO/VICODIN) 5-325 MG tablet Completed Course  . acetaminophen (TYLENOL) 325 MG tablet No longer needed (for PRN medications)  . docusate sodium (COLACE) 100 MG capsule No longer needed (for PRN medications)  . HUMULIN N 100 UNIT/ML injection Change in therapy  . INSULIN SYRINGE .5CC/29G 29G X 1/2" 0.5 ML MISC Change in therapy  . Insulin Syringe-Needle U-100 (INSULIN SYRINGE .5CC/31GX5/16") 31G X 5/16" 0.5 ML MISC Change in therapy  . protein supplement shake (PREMIER PROTEIN) LIQD Change in therapy  . ERIVEDGE 150 MG capsule Completed Course    Follow-up: No Follow-up on file.   [name], MD

## 2016-10-31 NOTE — Patient Instructions (Signed)
I'm glad you are feeling better   Continue your current medications   Let the doctor know that you had bloodwork done today and it will be available in [facility]

## 2016-10-31 NOTE — Progress Notes (Signed)
Pre-visit discussion using our clinic review tool. No additional management support is needed unless otherwise documented below in the visit note.  

## 2016-11-01 ENCOUNTER — Telehealth: Payer: Self-pay

## 2016-11-01 ENCOUNTER — Encounter: Payer: Self-pay | Admitting: Surgery

## 2016-11-01 ENCOUNTER — Ambulatory Visit (INDEPENDENT_AMBULATORY_CARE_PROVIDER_SITE_OTHER): Payer: Medicare Other | Admitting: Surgery

## 2016-11-01 ENCOUNTER — Other Ambulatory Visit: Payer: Self-pay | Admitting: Internal Medicine

## 2016-11-01 VITALS — BP 146/76 | HR 113 | Temp 98.1°F | Ht 71.0 in | Wt 176.4 lb

## 2016-11-01 DIAGNOSIS — K819 Cholecystitis, unspecified: Secondary | ICD-10-CM | POA: Diagnosis not present

## 2016-11-01 DIAGNOSIS — K8050 Calculus of bile duct without cholangitis or cholecystitis without obstruction: Secondary | ICD-10-CM

## 2016-11-01 DIAGNOSIS — E875 Hyperkalemia: Secondary | ICD-10-CM

## 2016-11-01 NOTE — Progress Notes (Signed)
11/01/2016  History of Present Illness: Henry Lindsey is a 83 y.o. male with a recent episode of cholecystitis and possible choledocholithiasis. He had been transferred over to [name] for possible ERCP. MRCP revealed no choledocholithiasis any further and his LFTs were improving. He cholecystostomy tube was placed. He was recently admitted to [name] with recurrent right upper quadrant pain and was noted to have mild surrounding stranding around the gallbladder but otherwise the drain was in correct position. He was discharged to home and is close to completing a course of antibiotics. He presents today for further evaluation for possible surgical management in the near future.  He currently denies any abdominal pain and reports that he is eating well without any nausea or vomiting. The drain still working and is in place without any significant tenderness although he does report some soreness around the insertion site. Otherwise denies any fevers, chills, chest pain, shortness of breath. Reports that he still trying to remain active and doing pushups.  Past Medical History: Past Medical History:  Diagnosis Date  . BPH (benign prostatic hyperplasia)   . Cancer (HCC)   . Carotid artery stenosis 11/2010   <50% bilaterally  . CVA (cerebral infarction) 11/2010   r thalamic lacunar  . Diabetes mellitus   . Hyperlipidemia   . Hypertension   . Neuromuscular disorder (HCC)   . Peripheral vascular disease in diabetes mellitus (HCC) 06/2012    95% occlusion s/p PTCA R SFA Dew Sept 2013     Past Surgical History: Past Surgical History:  Procedure Laterality Date  . IR GENERIC HISTORICAL  10/06/2016   IR PERC CHOLECYSTOSTOMY 10/06/2016 Glenn Yamagata, MD [facility]-INTER[facility] RAD  . JOINT REPLACEMENT Left 2014   left knee  . UPPER GASTROINTESTINAL ENDOSCOPY      Home Medications: Prior to Admission medications   Medication Sig Start Date End Date Taking? Authorizing Provider   Alum & Mag Hydroxide-Simeth (MAGIC MOUTHWASH) SOLN Take 5 mLs by mouth 3 (three) times daily as needed for mouth pain.   Yes Historical Provider, MD  amLODipine (NORVASC) 10 MG tablet  09/26/16  Yes Historical Provider, MD  amoxicillin-clavulanate (AUGMENTIN) 875-125 MG tablet Take 1 tablet by mouth every 12 (twelve) hours. 10/23/16  Yes Aruna Gouru, MD  aspirin 325 MG tablet Take 325 mg by mouth daily.   Yes Historical Provider, MD  atorvastatin (LIPITOR) 40 MG tablet TAKE 1 TABLET BY MOUTH EVERY DAY 07/15/16  Yes Teresa L Tullo, MD  Blood Glucose Monitoring Suppl (ONE TOUCH ULTRA SYSTEM KIT) w/Device KIT 1 kit by Does not apply route once. Use DX code E11.59 10/18/15  Yes Teresa L Tullo, MD  cilostazol (PLETAL) 100 MG tablet TAKE 1 TABLET(100 MG) BY MOUTH TWICE DAILY 08/13/16  Yes Teresa L Tullo, MD  cyclobenzaprine (FLEXERIL) 5 MG tablet Take 1 tablet (5 mg total) by mouth 3 (three) times daily as needed for muscle spasms. 09/10/16  Yes Teresa L Tullo, MD  famotidine (PEPCID) 20 MG tablet TAKE 1 TABLET(20 MG) BY MOUTH TWICE DAILY 05/08/16  Yes Teresa L Tullo, MD  feeding supplement, ENSURE ENLIVE, (ENSURE ENLIVE) LIQD Take 237 mLs by mouth daily. 10/23/16 11/22/16 Yes Aruna Gouru, MD  fenofibrate micronized (LOFIBRA) 134 MG capsule TAKE 1 CAPSULE BY MOUTH EVERY MORNING BEFORE BREAKFAST 08/28/16  Yes Teresa L Tullo, MD  finasteride (PROSCAR) 5 MG tablet Take 1 tablet (5 mg total) by mouth daily. 05/04/15  Yes Teresa L Tullo, MD  gabapentin (NEURONTIN) 400 MG   capsule TAKE 1 CAPSULE(400 MG) BY MOUTH THREE TIMES DAILY 09/09/16  Yes [name], MD  glipiZIDE (GLUCOTROL) 10 MG tablet TAKE 1 TABLET(10 MG) BY MOUTH TWICE DAILY 03/27/16  Yes [name], MD  glucose blood test strip Use as instructed three times daily 05/03/16  Yes [name], MD  HYDROcodone-acetaminophen (NORCO/VICODIN) 5-325 MG tablet One tablet two times daily as needed for knee pain 10/31/16  Yes [name], MD   HYDROcodone-acetaminophen (NORCO/VICODIN) 5-325 MG tablet One tablet two times daily as needed for knee pain 10/31/16  Yes [name], MD  INS SYRINGE/NEEDLE 1CC/28G (B-D INSULIN SYRINGE 1CC/28G) 28G X 1/2" 1 ML MISC USE TO ADMINISTER INSULIN DAILY 02/13/16  Yes [name], MD  insulin lispro protamine-lispro (HUMALOG MIX 75/25) (75-25) 100 UNIT/ML SUSP injection Inject 10 units right before  breakfast and 6 units before dinner 05/03/16  Yes [name], MD  JANUVIA 100 MG tablet TAKE 1 TABLET(100 MG) BY MOUTH DAILY. NOTE DOSE INCREASE 08/13/16  Yes [name], MD  latanoprost (XALATAN) 0.005 % ophthalmic solution INSTILL 1 DROP INTO BOTH EYES QD 11/01/15  Yes Historical Provider, MD  meloxicam (MOBIC) 15 MG tablet  09/29/16  Yes Historical Provider, MD  metFORMIN (GLUCOPHAGE) 1000 MG tablet TAKE 1 TABLET BY MOUTH TWICE DAILY 07/26/16  Yes [name], MD  metoprolol succinate (TOPROL-XL) 100 MG 24 hr tablet TAKE 1 TABLET BY MOUTH ONCE DAILY 09/03/13  Yes [name], MD  metroNIDAZOLE (FLAGYL) 500 MG tablet  10/08/16  Yes Historical Provider, MD  nitroGLYCERIN (NITROSTAT) 0.4 MG SL tablet Place 1 tablet (0.4 mg total) under the tongue every 5 (five) minutes as needed for chest pain. MAXIMUM 3 TABLETS 10/04/16  Yes [name], MD  ondansetron (ZOFRAN) 4 MG tablet Take 1 tablet (4 mg total) by mouth every 8 (eight) hours as needed for nausea or vomiting. 09/03/16  Yes [name], MD  [facility] DELICA LANCETS 23J MISC Use three times daily to check blood sugar. 10/18/15  Yes [name], MD  pantoprazole (PROTONIX) 40 MG tablet Take 1 tablet (40 mg total) by mouth 2 (two) times daily before a meal. 10/04/16  Yes [name], MD  tamsulosin (FLOMAX) 0.4 MG CAPS capsule Take 0.4 mg by mouth daily after breakfast.   Yes Historical Provider, MD  traMADol (ULTRAM) 50 MG tablet Take 50 mg by mouth every 6 (six) hours as needed.  11/24/13  Yes Historical Provider, MD     Allergies: No Known Allergies  Social History:  reports that he quit smoking about 48 years ago. His smoking use included Cigarettes. He started smoking about 81 years ago. He has a 16.50 pack-year smoking history. He has never used smokeless tobacco. He reports that he does not drink alcohol or use drugs.   Family History: Family History  Problem Relation Age of Onset  . Diabetes Mother     Review of Systems: Review of Systems  Constitutional: Negative for chills and fever.  HENT: Negative for hearing loss.   Eyes: Negative for blurred vision.  Respiratory: Negative for cough and shortness of breath.   Cardiovascular: Negative for chest pain and leg swelling.  Gastrointestinal: Negative for abdominal pain, heartburn, nausea and vomiting.  Genitourinary: Negative for dysuria and hematuria.  Musculoskeletal: Negative for myalgias.  Skin: Negative for rash.  Neurological: Negative for dizziness.  Psychiatric/Behavioral: Negative for depression.    Physical Exam BP (!) 146/76  Pulse (!) 113   Temp 98.1 F (36.7 C) (Oral)   Ht _0  (1.803 m)   Wt 80 kg (176 lb 6.4 oz)   BMI 24.60 kg/m  CONSTITUTIONAL: No acute distress HEENT:  Normocephalic, atraumatic, extraocular motion intact. NECK: Trachea is midline, and there is no jugular venous distension.  RESPIRATORY:  Lungs are clear, and breath sounds are equal bilaterally. Normal respiratory effort without pathologic use of accessory muscles. CARDIOVASCULAR: Heart is regular without murmurs, gallops, or rubs. GI: The abdomen is soft, nondistended, nontender to palpation. Patient has a right-sided percutaneous cholecystostomy tube in place draining bilious fluid. There is no evidence of infection around the insertion site of the tube. MUSCULOSKELETAL:  Normal muscle strength and tone in all four extremities.  No peripheral edema or cyanosis. SKIN: Skin turgor is normal. There are no pathologic skin lesions.  NEUROLOGIC:   Motor and sensation is grossly normal.  Cranial nerves are grossly intact. PSYCH:  Alert and oriented to person, place and time. Affect is normal.  Labs/Imaging: LFTs yesterday showed a total bilirubin of 0.7, AST 17 AST 16 alkaline phosphatase 65.  Cholangiogram done through the cholecystostomy tube yesterday showed contrast passing into the common bile duct with no filling defects and going into the duodenum.  Assessment and Plan: This is a 83 y.o. male who presents with a history of recent cholecystitis with choledocholithiasis status post percutaneous cholecystostomy tube placement. Currently there are no filling defects in the common bile duct and the drain is working well. LFTs are normalized.  Patient is asking about surgical management for his cholecystitis. Currently he is due to see his cardiologist next week. I have informed the patient that if his cardiologist clears him for surgery that we can tentatively book him for 1/24 for cholecystectomy with cholangiogram. If his cardiologist requires any further testing prior to surgery to assess his cardiac risk then we would postpone the surgery if need be.   Patient should complete his antibiotic course.  The patient also has been informed that for his surgery he should stop his aspirin and Pletal 5 days prior to.  The patient does understand this plan and all of his questions have been answered   Melvyn Neth, [place]

## 2016-11-01 NOTE — Patient Instructions (Signed)
Please continue your antibiotics until finished. Please continue with your drain care. We will need to obtain Cardiac Clearance from your Cardiologist so be sure to keep your appointment.  Please see your blue pre-care sheet for surgery information. Please call our office if you have any questions or concerns.

## 2016-11-01 NOTE — Telephone Encounter (Signed)
Cardiac and Anti-coagulant clearances faxed to DR.Ida Rogue at this time. Patient has appointment on 11/06/16 with DR.Gollan.

## 2016-11-03 NOTE — Assessment & Plan Note (Signed)
Recurrent lows were due to cholecystitis induced emesis  And have now resolved. Continue  glipizide 10 mg bid ,  insulin 75/25 10 units in the am and 6 units  pm,   and januvia    Lab Results  Component Value Date   HGBA1C 7.6 (H) 10/05/2016

## 2016-11-03 NOTE — Assessment & Plan Note (Signed)
He is requesting refill of vicodin for management of chronic knee pain. Refill history confirmed via [place] Controlled Substance databas, accessed by me today.[name]

## 2016-11-03 NOTE — Assessment & Plan Note (Signed)
Blood  cultures were negative and IV abx were changed to augmentin at discharge.  He has breen afebrile and denies diarrhea.

## 2016-11-03 NOTE — Assessment & Plan Note (Signed)
Managed with percutaneously placed cholecystomy tube which is still draining about 1200 ccs of bilious fluid daily per patient.   Plans for cholecystectomy are TBD pending follow up with general surgery

## 2016-11-03 NOTE — Assessment & Plan Note (Signed)
He has lost considerable weight dur to his recent admission for cholecystitis and loss of appetite.  Encouraged to maintain his current weight.  Body mass index is 24.55 kg/m.

## 2016-11-04 ENCOUNTER — Ambulatory Visit: Payer: Medicare Other | Admitting: Internal Medicine

## 2016-11-04 ENCOUNTER — Telehealth: Payer: Self-pay | Admitting: Surgery

## 2016-11-04 NOTE — Telephone Encounter (Signed)
Pt advised of pre op date/time and sx date. Sx: 11/13/16 with Dr Audry Riles cholecystectomy with Gram.  Pre op: 11/07/16 @ 10:15am--Office.   Patient made aware to call [phone], between 1-3:00pm the day before surgery, to find out what time to arrive.

## 2016-11-05 ENCOUNTER — Encounter: Payer: Self-pay | Admitting: Cardiovascular Disease

## 2016-11-05 ENCOUNTER — Other Ambulatory Visit: Payer: Medicare Other

## 2016-11-05 ENCOUNTER — Ambulatory Visit (INDEPENDENT_AMBULATORY_CARE_PROVIDER_SITE_OTHER): Payer: Medicare Other | Admitting: Cardiovascular Disease

## 2016-11-05 VITALS — BP 140/80 | HR 100 | Ht 71.0 in | Wt 177.2 lb

## 2016-11-05 DIAGNOSIS — E1151 Type 2 diabetes mellitus with diabetic peripheral angiopathy without gangrene: Secondary | ICD-10-CM | POA: Diagnosis not present

## 2016-11-05 DIAGNOSIS — Z01818 Encounter for other preprocedural examination: Secondary | ICD-10-CM | POA: Diagnosis not present

## 2016-11-05 DIAGNOSIS — IMO0002 Reserved for concepts with insufficient information to code with codable children: Secondary | ICD-10-CM

## 2016-11-05 DIAGNOSIS — E782 Mixed hyperlipidemia: Secondary | ICD-10-CM | POA: Diagnosis not present

## 2016-11-05 DIAGNOSIS — I739 Peripheral vascular disease, unspecified: Secondary | ICD-10-CM

## 2016-11-05 DIAGNOSIS — K819 Cholecystitis, unspecified: Secondary | ICD-10-CM

## 2016-11-05 DIAGNOSIS — E1165 Type 2 diabetes mellitus with hyperglycemia: Secondary | ICD-10-CM

## 2016-11-05 DIAGNOSIS — I2510 Atherosclerotic heart disease of native coronary artery without angina pectoris: Secondary | ICD-10-CM

## 2016-11-05 DIAGNOSIS — I63239 Cerebral infarction due to unspecified occlusion or stenosis of unspecified carotid arteries: Secondary | ICD-10-CM

## 2016-11-05 NOTE — Progress Notes (Signed)
Cardiology Office Note  Date:  11/05/2016   ID:  Henry Lindsey, DOB 1933/10/09, MRN [id number]  PCP:  Sherlene Shams, MD   Chief Complaint  Patient presents with  . OTHER    Cardiac clearance. Meds reviewed verablly with pt.    HPI:  Henry Lindsey is a 83 y.o. male with a history of hyperlipidemia, diabetes, peripheral vascular disease with  angioplasty of the distal right SFA by Dr.  Wyn Quaker, 50% carotid disease bilaterally, history of poorly controlled diabetes, with history of stroke with minimal residual left sided deficits, previously seen for abnormal EKG likely with old inferior MI, coronary and aortic disease on CT scan December 2017 Prior smoker, stopped in 1969  Recent hospital admission for sepsis, cholecystitis, abdominal pain Gall bladder surgery scheduled for 11/13/2016 at [facility] Has drain in place Weight is Down 30 pounds, back up 12 pounds, reports he is feeling better Eating liver putting with bananas, potassium 5.5  Denies any chest pain or shortness of breath on exertion Total cholesterol 131  CT scan 10/05/2016 reviewed by myself, images pulled up Moderate diffuse aortic atherosclerosis Moderate diffuse three-vessel coronary disease  Reports compliance with his cholesterol medication, blood pressure pills  At baseline is Very active at baseline, chops wood, does lots of activities around the house. Typically does not have any chest pain or shortness of breath on exertion.   EKG on today's visit shows normal sinus rhythm rate 99 bpm, old inferior MI, no change compared to EKG dated in 2013  Other past medical history  chronic left knee pain.  History of claudication type symptoms have improved particularly on the right side after angioplasty .  Previous ultrasound showed bilateral tibial occlusive disease, residual 50% left SFA stenosis .   He does have a history of a right thalamic lacunar infarct in February 2012. Prior  carotid Dopplers noted less than 50%  stenosis bilaterally.    PMH:   has a past medical history of BPH (benign prostatic hyperplasia); Cancer (HCC); Carotid artery stenosis (11/2010); CVA (cerebral infarction) (11/2010); Diabetes mellitus; Hyperlipidemia; Hypertension; Neuromuscular disorder (HCC); and Peripheral vascular disease in diabetes mellitus (HCC) (06/2012).  PSH:    Past Surgical History:  Procedure Laterality Date  . IR GENERIC HISTORICAL  10/06/2016   IR PERC CHOLECYSTOSTOMY 10/06/2016 Irish Lack, MD [facility]INTER[facility]  . JOINT REPLACEMENT Left 2014   left knee  . UPPER GASTROINTESTINAL ENDOSCOPY      Current Outpatient Prescriptions  Medication Sig Dispense Refill  . Alum & Mag Hydroxide-Simeth (MAGIC MOUTHWASH) SOLN Take 5 mLs by mouth 3 (three) times daily as needed for mouth pain.    [name] amLODipine (NORVASC) 10 MG tablet Take 10 mg by mouth daily.     [name] aspirin 81 MG tablet Take 1 tablet (81 mg total) by mouth daily.    [name] atorvastatin (LIPITOR) 40 MG tablet TAKE 1 TABLET BY MOUTH EVERY DAY 90 tablet 2  . Blood Glucose Monitoring Suppl (ONE TOUCH ULTRA SYSTEM KIT) w/Device KIT 1 kit by Does not apply route once. Use DX code E11.59 1 each 0  . cilostazol (PLETAL) 100 MG tablet TAKE 1 TABLET(100 MG) BY MOUTH TWICE DAILY 60 tablet 3  . cyclobenzaprine (FLEXERIL) 5 MG tablet Take 1 tablet (5 mg total) by mouth 3 (three) times daily as needed for muscle spasms. 90 tablet 3  . famotidine (PEPCID) 20 MG tablet TAKE 1 TABLET(20 MG) BY MOUTH TWICE DAILY 60 tablet 5  . feeding supplement, ENSURE  ENLIVE, (ENSURE ENLIVE) LIQD Take 237 mLs by mouth daily. (Patient taking differently: Take 237 mLs by mouth 2 (two) times daily. ) 30 Bottle 0  . fenofibrate micronized (LOFIBRA) 134 MG capsule TAKE 1 CAPSULE BY MOUTH EVERY MORNING BEFORE BREAKFAST 30 capsule 0  . gabapentin (NEURONTIN) 400 MG capsule TAKE 1 CAPSULE(400 MG) BY MOUTH THREE TIMES DAILY 120 capsule 2  . glipiZIDE (GLUCOTROL) 10 MG tablet TAKE 1 TABLET(10 MG) BY  MOUTH TWICE DAILY 60 tablet 4  . glucose blood test strip Use as instructed three times daily 100 each 12  . HYDROcodone-acetaminophen (NORCO/VICODIN) 5-325 MG tablet One tablet two times daily as needed for knee pain (Patient taking differently: Take 1 tablet by mouth 2 (two) times daily. One tablet two times daily as needed for knee pain) 60 tablet 0  . INS SYRINGE/NEEDLE 1CC/28G (B-D INSULIN SYRINGE 1CC/28G) 28G X 1/2" 1 ML MISC USE TO ADMINISTER INSULIN DAILY 100 each 0  . insulin lispro protamine-lispro (HUMALOG MIX 75/25) (75-25) 100 UNIT/ML SUSP injection Inject 10 units right before  breakfast and 6 units before dinner 10 mL 5  . JANUVIA 100 MG tablet TAKE 1 TABLET(100 MG) BY MOUTH DAILY. NOTE DOSE INCREASE 30 tablet 3  . latanoprost (XALATAN) 0.005 % ophthalmic solution INSTILL 1 DROP INTO BOTH EYES QD  5  . meloxicam (MOBIC) 15 MG tablet Take 15 mg by mouth daily.     . metFORMIN (GLUCOPHAGE) 1000 MG tablet TAKE 1 TABLET BY MOUTH TWICE DAILY 180 tablet 1  . metoprolol succinate (TOPROL-XL) 100 MG 24 hr tablet TAKE 1 TABLET BY MOUTH ONCE DAILY 30 tablet 5  . Multiple Vitamin (MULTIVITAMIN WITH MINERALS) TABS tablet Take 1 tablet by mouth daily.    . nitroGLYCERIN (NITROSTAT) 0.4 MG SL tablet Place 1 tablet (0.4 mg total) under the tongue every 5 (five) minutes as needed for chest pain. MAXIMUM 3 TABLETS 50 tablet 3  . ondansetron (ZOFRAN) 4 MG tablet Take 1 tablet (4 mg total) by mouth every 8 (eight) hours as needed for nausea or vomiting. 30 tablet 3  . ONETOUCH DELICA LANCETS 71I MISC Use three times daily to check blood sugar. 100 each 11  . pantoprazole (PROTONIX) 40 MG tablet Take 1 tablet (40 mg total) by mouth 2 (two) times daily before a meal. 60 tablet 0  . tamsulosin (FLOMAX) 0.4 MG CAPS capsule Take 0.4 mg by mouth daily after breakfast.    . traMADol (ULTRAM) 50 MG tablet Take 50 mg by mouth at bedtime.      No current facility-administered medications for this visit.       Allergies:   Patient has no known allergies.   Social History:  The patient  reports that he quit smoking about 48 years ago. His smoking use included Cigarettes. He started smoking about 81 years ago. He has a 16.50 pack-year smoking history. He has never used smokeless tobacco. He reports that he does not drink alcohol or use drugs.   Family History:   family history includes Diabetes in his mother.    Review of Systems: Review of Systems  Constitutional: Negative.   Respiratory: Negative.   Cardiovascular: Negative.   Gastrointestinal: Negative.   Musculoskeletal: Negative.   Neurological: Negative.   Psychiatric/Behavioral: Negative.   All other systems reviewed and are negative.    PHYSICAL EXAM: VS:  BP 140/80 (BP Location: Left Arm, Patient Position: Sitting, Cuff Size: Normal)   Pulse 100   Ht '5\' 11"'$  (1.803 m)  Wt 177 lb 4 oz (80.4 kg)   BMI 24.72 kg/m  , BMI Body mass index is 24.72 kg/m. GEN: Well nourished, well developed, in no acute distress  HEENT: normal  Neck: no JVD, carotid bruits, or masses Cardiac: RRR; no murmurs, rubs, or gallops,no edema  Respiratory:  clear to auscultation bilaterally, normal work of breathing GI: soft, nontender, nondistended, + BS, biliary drain in place MS: no deformity or atrophy  Skin: warm and dry, no rash Neuro:  Strength and sensation are intact Psych: euthymic mood, full affect    Recent Labs: 09/03/2016: Magnesium 2.2 10/20/2016: TSH 2.943 10/31/2016: ALT 17; BUN 22; Creatinine, Ser 1.16; Hemoglobin 13.7; Platelets 331.0; Potassium 5.5; Sodium 136    Lipid Panel Lab Results  Component Value Date   CHOL 131 10/04/2016   HDL 58.70 10/04/2016   LDLCALC 61 10/04/2016   TRIG 53.0 10/04/2016      Wt Readings from Last 3 Encounters:  11/05/16 177 lb 4 oz (80.4 kg)  11/01/16 176 lb 6.4 oz (80 kg)  10/31/16 176 lb (79.8 kg)       ASSESSMENT AND PLAN:  Preoperative clearance - Plan: EKG 12-Lead As  below, he is of moderate risk given underlying coronary disease though with no underlying angina, active at baseline, he is acceptable risk to proceed with the surgery on his gallbladder next week.  Mixed hyperlipidemia Cholesterol is at goal on the current lipid regimen. No changes to the medications were made.  Type 2 diabetes, uncontrolled, with peripheral circulatory disorder (HCC) Stressed importance of aggressive diabetes control Recently lost 30 pounds, gained 12 pounds back  PAD (peripheral artery disease) (HCC) Diffuse aortic atherosclerosis noted, moderate degree  Carotid artery stenosis with cerebral infarction [facility]) Followed by Dr. Lucky Cowboy  Coronary artery disease involving native coronary artery of native heart without angina pectoris Stress tests ordered in the past in 2013, he did not follow up on this testing Repeat stress test recommended 10/04/2016 for chest pain (he was not seen, we only received a phone call that he was having chest pain) Patient denied he was having any chest pain symptoms, did not think symptoms were cardiac, he refused the stress test. On today's visit reports he feels well, is active, no anginal symptoms He does have coronary disease on CT scan, EKG concerning for prior inferior wall MI (dating back to 2013). Per the guidelines, cholecystectomy is not a high risk procedure, he is not having any anginal symptoms though likely of moderate risk. I feel it is acceptable to let him  proceed with his gallbladder surgery.  Cholecystitis Currently with drain in place, scheduled for surgery in one week   Total encounter time more than 25 minutes  Greater than 50% was spent in counseling and coordination of care with the patient   Disposition:   F/U  6 months   Orders Placed This Encounter  Procedures  . EKG 12-Lead     Signed, Esmond Plants, M.D., Ph.D. 11/05/2016  [place], [place]

## 2016-11-05 NOTE — Patient Instructions (Addendum)
Medication Instructions:   Please monitor your pulse at home If it continues to run 90 to 100 on the pulse/beats per minute Then add extra 1/2 pill metoprolol in the evening  Cut the aspirin down to 81 mg daily  Take the metoprolol and amlodipine the morning of the surgery  Labwork:  No new labs needed  Testing/Procedures:  No further testing at this time   I recommend watching educational videos on topics of interest to you at:       [email or web address].com  Enter code: [email or web address]    Follow-Up: It was a pleasure seeing you in the office today. Please call us if you have new issues that need to be addressed before your next appt.  [phone]  Your physician wants you to follow-up in: 6 months.  You will receive a reminder letter in the mail two months in advance. If you don't receive a letter, please call our office to schedule the follow-up appointment.  If you need a refill on your cardiac medications before your next appointment, please call your pharmacy.

## 2016-11-06 ENCOUNTER — Ambulatory Visit: Payer: Medicare Other | Admitting: Cardiovascular Disease

## 2016-11-07 ENCOUNTER — Inpatient Hospital Stay: Admission: RE | Admit: 2016-11-07 | Payer: Medicare Other | Source: Ambulatory Visit

## 2016-11-07 ENCOUNTER — Other Ambulatory Visit: Payer: Self-pay | Admitting: Internal Medicine

## 2016-11-11 ENCOUNTER — Telehealth: Payer: Self-pay | Admitting: Cardiovascular Disease

## 2016-11-11 ENCOUNTER — Telehealth: Payer: Self-pay

## 2016-11-11 ENCOUNTER — Encounter
Admission: RE | Admit: 2016-11-11 | Discharge: 2016-11-11 | Disposition: A | Discharge status: Home / Self Care | Payer: Medicare Other | Source: Ambulatory Visit | Attending: Surgery | Admitting: Surgery

## 2016-11-11 DIAGNOSIS — Z79899 Other long term (current) drug therapy: Secondary | ICD-10-CM | POA: Diagnosis not present

## 2016-11-11 DIAGNOSIS — E785 Hyperlipidemia, unspecified: Secondary | ICD-10-CM | POA: Diagnosis not present

## 2016-11-11 DIAGNOSIS — K219 Gastro-esophageal reflux disease without esophagitis: Secondary | ICD-10-CM | POA: Diagnosis not present

## 2016-11-11 DIAGNOSIS — E1151 Type 2 diabetes mellitus with diabetic peripheral angiopathy without gangrene: Secondary | ICD-10-CM | POA: Diagnosis not present

## 2016-11-11 DIAGNOSIS — Z794 Long term (current) use of insulin: Secondary | ICD-10-CM | POA: Diagnosis not present

## 2016-11-11 DIAGNOSIS — Z01812 Encounter for preprocedural laboratory examination: Secondary | ICD-10-CM

## 2016-11-11 DIAGNOSIS — I6523 Occlusion and stenosis of bilateral carotid arteries: Secondary | ICD-10-CM | POA: Diagnosis not present

## 2016-11-11 DIAGNOSIS — K8012 Calculus of gallbladder with acute and chronic cholecystitis without obstruction: Secondary | ICD-10-CM | POA: Diagnosis not present

## 2016-11-11 DIAGNOSIS — I2510 Atherosclerotic heart disease of native coronary artery without angina pectoris: Secondary | ICD-10-CM | POA: Diagnosis not present

## 2016-11-11 DIAGNOSIS — N400 Enlarged prostate without lower urinary tract symptoms: Secondary | ICD-10-CM | POA: Diagnosis not present

## 2016-11-11 DIAGNOSIS — Z7982 Long term (current) use of aspirin: Secondary | ICD-10-CM | POA: Diagnosis not present

## 2016-11-11 DIAGNOSIS — I10 Essential (primary) hypertension: Secondary | ICD-10-CM | POA: Diagnosis not present

## 2016-11-11 DIAGNOSIS — Z8673 Personal history of transient ischemic attack (TIA), and cerebral infarction without residual deficits: Secondary | ICD-10-CM | POA: Diagnosis not present

## 2016-11-11 DIAGNOSIS — K8040 Calculus of bile duct with cholecystitis, unspecified, without obstruction: Secondary | ICD-10-CM | POA: Diagnosis present

## 2016-11-11 DIAGNOSIS — Z87891 Personal history of nicotine dependence: Secondary | ICD-10-CM | POA: Diagnosis not present

## 2016-11-11 HISTORY — DX: Gastro-esophageal reflux disease without esophagitis: K21.9

## 2016-11-11 LAB — POTASSIUM: Potassium: 3.7 mmol/L (ref 3.5–5.1)

## 2016-11-11 MED ORDER — METOPROLOL SUCCINATE ER 100 MG PO TB24: 100.0000 mg | ORAL_TABLET | Freq: Every day | ORAL | 3 refills | Status: DC

## 2016-11-11 MED ORDER — AMLODIPINE BESYLATE 10 MG PO TABS: 10.0000 mg | ORAL_TABLET | Freq: Every day | ORAL | 3 refills | Status: DC

## 2016-11-11 NOTE — Telephone Encounter (Signed)
Cardiac Clearance obtained from DR.Ida Rogue at this time. Please see office note from 11/05/16 under Preoperative clearance for further information.

## 2016-11-11 NOTE — Patient Instructions (Signed)
Your procedure is scheduled on: Wednesday 11/13/16 Report to [facility]. 2ND FLOOR MEDICAL MALL ENTRANCE. To find out your arrival time please call [phone] between 1PM - 3PM on Tuesday 11/12/16.  Remember: Instructions that are not followed completely may result in serious medical risk, up to and including death, or upon the discretion of your surgeon and anesthesiologist your surgery may need to be rescheduled.    __X__ 1. Do not eat food or drink liquids after midnight. No gum chewing or hard candies.     __X__ 2. No Alcohol for 24 hours before or after surgery.   ____ 3. Bring all medications with you on the day of surgery if instructed.    __X__ 4. Notify your doctor if there is any change in your medical condition     (cold, fever, infections).             __X___5. No smoking within 24 hours of your surgery.     Do not wear jewelry, make-up, hairpins, clips or nail polish.  Do not wear lotions, powders, or perfumes.   Do not shave 48 hours prior to surgery. Men may shave face and neck.  Do not bring valuables to the hospital.    [facility] is not responsible for any belongings or valuables.               Contacts, dentures or bridgework may not be worn into surgery.  Leave your suitcase in the car. After surgery it may be brought to your room.  For patients admitted to the hospital, discharge time is determined by your                treatment team.   Patients discharged the day of surgery will not be allowed to drive home.   Please read over the following fact sheets that you were given:   MRSA Information   __X__ Take these medicines the morning of surgery with A SIP OF WATER:    1. AMLODIPINE  2. ATORVASTATIN  3. FAMOTIDINE  4. GABAPENTIN  5. PANTOPRAZOLE  6.  ____ Fleet Enema (as directed)   __x__ Use CHG Soap as directed  ____ Use inhalers on the day of surgery  __X__ Stop metformin 2 days prior to surgery    __X__ Take 1/2 of usual insulin dose the  night before surgery and none on the morning of surgery.   __X__ Stop Coumadin/Plavix/aspirin on AS INSTRUCTED BY YOUR CARDIOLOGIST  __X__ Stop Anti-inflammatories such as Advil, Aleve, Ibuprofen, Motrin, Naproxen, Naprosyn, Goodies,powder, or aspirin products.  OK to take Tylenol. STOP MELOXICAM   ____ Stop supplements until after surgery.    ____ Bring C-Pap to the hospital.

## 2016-11-11 NOTE — Telephone Encounter (Signed)
*  STAT* If patient is at the pharmacy, call can be transferred to refill team.   1. Which medications need to be refilled? (please list name of each medication and dose if known) amLODipine (NORVASC) 10 MG tablet, metoprolol succinate (TOPROL-XL) 100 MG 24 hr tablet, tamsulosin (FLOMAX) 0.4 MG CAPS capsule  2. Which pharmacy/location (including street and city if local pharmacy) is medication to be sent to? [place]  3. Do they need a 30 day or 90 day supply? 90 day

## 2016-11-11 NOTE — Pre-Procedure Instructions (Signed)
Patient cleared by Dr Rockey Situ 11/05/16.

## 2016-11-12 MED ORDER — CEFAZOLIN SODIUM-DEXTROSE 2-4 GM/100ML-% IV SOLN
2.0000 g | INTRAVENOUS | Status: AC
  Administered 2016-11-13: 2 g via INTRAVENOUS

## 2016-11-13 ENCOUNTER — Ambulatory Visit: Payer: Medicare Other

## 2016-11-13 ENCOUNTER — Encounter
Admission: RE | Disposition: A | Discharge status: Home / Self Care | Payer: Self-pay | Source: Ambulatory Visit | Attending: Surgery

## 2016-11-13 ENCOUNTER — Ambulatory Visit: Payer: Medicare Other | Admitting: Anesthesiology

## 2016-11-13 ENCOUNTER — Observation Stay
Admission: RE | Admit: 2016-11-13 | Discharge: 2016-11-14 | Disposition: A | Discharge status: Home / Self Care | Payer: Medicare Other | Source: Ambulatory Visit | Attending: Surgery | Admitting: Surgery

## 2016-11-13 ENCOUNTER — Encounter: Payer: Self-pay | Admitting: *Deleted

## 2016-11-13 DIAGNOSIS — I2510 Atherosclerotic heart disease of native coronary artery without angina pectoris: Secondary | ICD-10-CM | POA: Insufficient documentation

## 2016-11-13 DIAGNOSIS — I10 Essential (primary) hypertension: Secondary | ICD-10-CM | POA: Insufficient documentation

## 2016-11-13 DIAGNOSIS — Z79899 Other long term (current) drug therapy: Secondary | ICD-10-CM | POA: Insufficient documentation

## 2016-11-13 DIAGNOSIS — Z794 Long term (current) use of insulin: Secondary | ICD-10-CM | POA: Insufficient documentation

## 2016-11-13 DIAGNOSIS — K8012 Calculus of gallbladder with acute and chronic cholecystitis without obstruction: Principal | ICD-10-CM | POA: Insufficient documentation

## 2016-11-13 DIAGNOSIS — E1151 Type 2 diabetes mellitus with diabetic peripheral angiopathy without gangrene: Secondary | ICD-10-CM | POA: Insufficient documentation

## 2016-11-13 DIAGNOSIS — K810 Acute cholecystitis: Secondary | ICD-10-CM | POA: Diagnosis present

## 2016-11-13 DIAGNOSIS — Z419 Encounter for procedure for purposes other than remedying health state, unspecified: Secondary | ICD-10-CM

## 2016-11-13 DIAGNOSIS — Z7982 Long term (current) use of aspirin: Secondary | ICD-10-CM | POA: Insufficient documentation

## 2016-11-13 DIAGNOSIS — Z8673 Personal history of transient ischemic attack (TIA), and cerebral infarction without residual deficits: Secondary | ICD-10-CM | POA: Insufficient documentation

## 2016-11-13 DIAGNOSIS — E785 Hyperlipidemia, unspecified: Secondary | ICD-10-CM | POA: Insufficient documentation

## 2016-11-13 DIAGNOSIS — I6523 Occlusion and stenosis of bilateral carotid arteries: Secondary | ICD-10-CM | POA: Insufficient documentation

## 2016-11-13 DIAGNOSIS — Z87891 Personal history of nicotine dependence: Secondary | ICD-10-CM | POA: Insufficient documentation

## 2016-11-13 DIAGNOSIS — K219 Gastro-esophageal reflux disease without esophagitis: Secondary | ICD-10-CM | POA: Insufficient documentation

## 2016-11-13 DIAGNOSIS — N400 Enlarged prostate without lower urinary tract symptoms: Secondary | ICD-10-CM | POA: Insufficient documentation

## 2016-11-13 HISTORY — PX: CHOLECYSTECTOMY: SHX55

## 2016-11-13 LAB — COMPREHENSIVE METABOLIC PANEL
ALBUMIN: 3.5 g/dL (ref 3.5–5.0)
ALT: 276 U/L — AB (ref 17–63)
AST: 296 U/L — AB (ref 15–41)
Alkaline Phosphatase: 277 U/L — ABNORMAL HIGH (ref 38–126)
Anion gap: 7 (ref 5–15)
BUN: 15 mg/dL (ref 6–20)
CHLORIDE: 102 mmol/L (ref 101–111)
CO2: 28 mmol/L (ref 22–32)
Calcium: 8 mg/dL — ABNORMAL LOW (ref 8.9–10.3)
Creatinine, Ser: 0.99 mg/dL (ref 0.61–1.24)
GFR calc Af Amer: >60 mL/min (ref >60)
GLUCOSE: 236 mg/dL — AB (ref 65–99)
POTASSIUM: 3.7 mmol/L (ref 3.5–5.1)
Sodium: 137 mmol/L (ref 135–145)
Total Bilirubin: 0.8 mg/dL (ref 0.3–1.2)
Total Protein: 6.2 g/dL — ABNORMAL LOW (ref 6.5–8.1)

## 2016-11-13 LAB — GLUCOSE, CAPILLARY
GLUCOSE-CAPILLARY: 135 mg/dL — AB (ref 65–99)
Glucose-Capillary: 195 mg/dL — ABNORMAL HIGH (ref 65–99)

## 2016-11-13 SURGERY — LAPAROSCOPIC CHOLECYSTECTOMY WITH INTRAOPERATIVE CHOLANGIOGRAM
Anesthesia: General | Wound class: Contaminated

## 2016-11-13 MED ORDER — ONDANSETRON HCL 4 MG/2ML IJ SOLN: 4.0000 mg | Freq: Four times a day (QID) | INTRAMUSCULAR | Status: DC | PRN

## 2016-11-13 MED ORDER — ONDANSETRON 4 MG PO TBDP: 4.0000 mg | ORAL_TABLET | Freq: Four times a day (QID) | ORAL | Status: DC | PRN

## 2016-11-13 MED ORDER — TRAMADOL HCL 50 MG PO TABS
50.0000 mg | ORAL_TABLET | Freq: Every day | ORAL | Status: DC
  Administered 2016-11-13: 50 mg via ORAL
  Filled 2016-11-13: qty 1

## 2016-11-13 MED ORDER — SODIUM CHLORIDE 0.9 % IV SOLN
INTRAVENOUS | Status: DC | PRN
  Administered 2016-11-13: 40 mL

## 2016-11-13 MED ORDER — ACETAMINOPHEN 500 MG PO TABS
1000.0000 mg | ORAL_TABLET | ORAL | Status: AC
  Administered 2016-11-13: 1000 mg via ORAL

## 2016-11-13 MED ORDER — ACETAMINOPHEN 500 MG PO TABS
ORAL_TABLET | ORAL | Status: AC
  Administered 2016-11-13: 1000 mg via ORAL
  Filled 2016-11-13: qty 2

## 2016-11-13 MED ORDER — FENTANYL CITRATE (PF) 100 MCG/2ML IJ SOLN
25.0000 ug | INTRAMUSCULAR | Status: DC | PRN
  Administered 2016-11-13 (×2): 50 ug via INTRAVENOUS

## 2016-11-13 MED ORDER — METFORMIN HCL 500 MG PO TABS
1000.0000 mg | ORAL_TABLET | Freq: Two times a day (BID) | ORAL | Status: DC
  Administered 2016-11-14: 1000 mg via ORAL
  Filled 2016-11-13 (×2): qty 2

## 2016-11-13 MED ORDER — MELOXICAM 7.5 MG PO TABS: 15.0000 mg | ORAL_TABLET | Freq: Every day | ORAL | Status: DC

## 2016-11-13 MED ORDER — ROCURONIUM BROMIDE 50 MG/5ML IV SOSY
PREFILLED_SYRINGE | INTRAVENOUS | Status: AC
  Filled 2016-11-13: qty 5

## 2016-11-13 MED ORDER — ATORVASTATIN CALCIUM 20 MG PO TABS: 40.0000 mg | ORAL_TABLET | Freq: Every day | ORAL | Status: DC

## 2016-11-13 MED ORDER — POLYETHYLENE GLYCOL 3350 17 G PO PACK: 17.0000 g | PACK | Freq: Every day | ORAL | Status: DC | PRN

## 2016-11-13 MED ORDER — OXYCODONE HCL 5 MG PO TABS: 5.0000 mg | ORAL_TABLET | Freq: Once | ORAL | Status: DC | PRN

## 2016-11-13 MED ORDER — ONDANSETRON HCL 4 MG/2ML IJ SOLN
INTRAMUSCULAR | Status: DC | PRN
  Administered 2016-11-13: 4 mg via INTRAVENOUS

## 2016-11-13 MED ORDER — FENTANYL CITRATE (PF) 100 MCG/2ML IJ SOLN
INTRAMUSCULAR | Status: AC
  Filled 2016-11-13: qty 2

## 2016-11-13 MED ORDER — ADULT MULTIVITAMIN W/MINERALS CH: 1.0000 | ORAL_TABLET | Freq: Every day | ORAL | Status: DC

## 2016-11-13 MED ORDER — CHLORHEXIDINE GLUCONATE CLOTH 2 % EX PADS
6.0000 | MEDICATED_PAD | Freq: Once | CUTANEOUS | Status: AC
  Administered 2016-11-13: 6 via TOPICAL

## 2016-11-13 MED ORDER — LIDOCAINE HCL (CARDIAC) 20 MG/ML IV SOLN
INTRAVENOUS | Status: DC | PRN
  Administered 2016-11-13: 80 mg via INTRAVENOUS

## 2016-11-13 MED ORDER — HYDROMORPHONE HCL 1 MG/ML IJ SOLN
0.5000 mg | INTRAMUSCULAR | Status: DC | PRN
  Administered 2016-11-14: 0.5 mg via INTRAVENOUS
  Filled 2016-11-13: qty 0.5

## 2016-11-13 MED ORDER — SUGAMMADEX SODIUM 200 MG/2ML IV SOLN
INTRAVENOUS | Status: DC | PRN
  Administered 2016-11-13: 160 mg via INTRAVENOUS

## 2016-11-13 MED ORDER — BUPIVACAINE-EPINEPHRINE 0.5% -1:200000 IJ SOLN
INTRAMUSCULAR | Status: DC | PRN
  Administered 2016-11-13: 30 mL

## 2016-11-13 MED ORDER — ENSURE ENLIVE PO LIQD
237.0000 mL | Freq: Two times a day (BID) | ORAL | Status: DC
  Administered 2016-11-13: 237 mL via ORAL

## 2016-11-13 MED ORDER — LINAGLIPTIN 5 MG PO TABS
5.0000 mg | ORAL_TABLET | Freq: Every day | ORAL | Status: DC
  Administered 2016-11-13: 5 mg via ORAL
  Filled 2016-11-13: qty 1

## 2016-11-13 MED ORDER — CEFAZOLIN SODIUM-DEXTROSE 2-4 GM/100ML-% IV SOLN
INTRAVENOUS | Status: AC
  Administered 2016-11-13: 2 g via INTRAVENOUS
  Filled 2016-11-13: qty 100

## 2016-11-13 MED ORDER — AMLODIPINE BESYLATE 10 MG PO TABS: 10.0000 mg | ORAL_TABLET | Freq: Every day | ORAL | Status: DC

## 2016-11-13 MED ORDER — PROPOFOL 10 MG/ML IV BOLUS
INTRAVENOUS | Status: AC
  Filled 2016-11-13: qty 20

## 2016-11-13 MED ORDER — HYDROCODONE-ACETAMINOPHEN 5-325 MG PO TABS
1.0000 | ORAL_TABLET | ORAL | Status: DC | PRN
  Administered 2016-11-13 – 2016-11-14 (×3): 2 via ORAL
  Filled 2016-11-13 (×3): qty 2

## 2016-11-13 MED ORDER — POTASSIUM CHLORIDE IN NACL 20-0.9 MEQ/L-% IV SOLN
INTRAVENOUS | Status: DC
  Administered 2016-11-13: 20:00:00 via INTRAVENOUS
  Filled 2016-11-13 (×3): qty 1000

## 2016-11-13 MED ORDER — HYDROCODONE-ACETAMINOPHEN 5-325 MG PO TABS: 1.0000 | ORAL_TABLET | Freq: Four times a day (QID) | ORAL | 0 refills | Status: DC | PRN

## 2016-11-13 MED ORDER — GABAPENTIN 300 MG PO CAPS
ORAL_CAPSULE | ORAL | Status: AC
  Administered 2016-11-13: 300 mg via ORAL
  Filled 2016-11-13: qty 1

## 2016-11-13 MED ORDER — SODIUM CHLORIDE 0.9 % IV SOLN
INTRAVENOUS | Status: DC
  Administered 2016-11-13 (×2): via INTRAVENOUS

## 2016-11-13 MED ORDER — HEPARIN SODIUM (PORCINE) 5000 UNIT/ML IJ SOLN
5000.0000 [IU] | Freq: Three times a day (TID) | INTRAMUSCULAR | Status: DC
  Administered 2016-11-14: 5000 [IU] via SUBCUTANEOUS
  Filled 2016-11-13: qty 1

## 2016-11-13 MED ORDER — GLIPIZIDE 5 MG PO TABS
10.0000 mg | ORAL_TABLET | Freq: Two times a day (BID) | ORAL | Status: DC
  Administered 2016-11-14: 10 mg via ORAL
  Filled 2016-11-13: qty 2

## 2016-11-13 MED ORDER — GABAPENTIN 400 MG PO CAPS
400.0000 mg | ORAL_CAPSULE | Freq: Three times a day (TID) | ORAL | Status: DC
  Administered 2016-11-13: 400 mg via ORAL
  Filled 2016-11-13: qty 1

## 2016-11-13 MED ORDER — PROPOFOL 10 MG/ML IV BOLUS
INTRAVENOUS | Status: DC | PRN
  Administered 2016-11-13: 120 mg via INTRAVENOUS

## 2016-11-13 MED ORDER — PROMETHAZINE HCL 25 MG/ML IJ SOLN: 6.2500 mg | INTRAMUSCULAR | Status: DC | PRN

## 2016-11-13 MED ORDER — BUPIVACAINE-EPINEPHRINE (PF) 0.25% -1:200000 IJ SOLN
INTRAMUSCULAR | Status: AC
  Filled 2016-11-13: qty 30

## 2016-11-13 MED ORDER — METOPROLOL SUCCINATE ER 100 MG PO TB24: 100.0000 mg | ORAL_TABLET | Freq: Every day | ORAL | Status: DC

## 2016-11-13 MED ORDER — PANTOPRAZOLE SODIUM 40 MG PO TBEC: 40.0000 mg | DELAYED_RELEASE_TABLET | Freq: Every day | ORAL | Status: DC

## 2016-11-13 MED ORDER — OXYCODONE HCL 5 MG/5ML PO SOLN: 5.0000 mg | Freq: Once | ORAL | Status: DC | PRN

## 2016-11-13 MED ORDER — TAMSULOSIN HCL 0.4 MG PO CAPS: 0.4000 mg | ORAL_CAPSULE | Freq: Every day | ORAL | Status: DC

## 2016-11-13 MED ORDER — EPHEDRINE SULFATE 50 MG/ML IJ SOLN
INTRAMUSCULAR | Status: DC | PRN
  Administered 2016-11-13: 15 mg via INTRAVENOUS

## 2016-11-13 MED ORDER — FENTANYL CITRATE (PF) 100 MCG/2ML IJ SOLN
INTRAMUSCULAR | Status: DC | PRN
  Administered 2016-11-13: 25 ug via INTRAVENOUS
  Administered 2016-11-13: 50 ug via INTRAVENOUS
  Administered 2016-11-13: 25 ug via INTRAVENOUS
  Administered 2016-11-13: 50 ug via INTRAVENOUS

## 2016-11-13 MED ORDER — SUGAMMADEX SODIUM 200 MG/2ML IV SOLN
INTRAVENOUS | Status: AC
  Filled 2016-11-13: qty 2

## 2016-11-13 MED ORDER — GABAPENTIN 300 MG PO CAPS
300.0000 mg | ORAL_CAPSULE | ORAL | Status: AC
  Administered 2016-11-13: 300 mg via ORAL

## 2016-11-13 MED ORDER — EPHEDRINE 5 MG/ML INJ
INTRAVENOUS | Status: AC
  Filled 2016-11-13: qty 10

## 2016-11-13 MED ORDER — NITROGLYCERIN 0.4 MG SL SUBL
0.4000 mg | SUBLINGUAL_TABLET | SUBLINGUAL | Status: DC | PRN
Start: 2016-11-13 — End: 2016-11-14

## 2016-11-13 MED ORDER — LACTATED RINGERS IV SOLN: INTRAVENOUS | Status: DC | PRN

## 2016-11-13 MED ORDER — ROCURONIUM BROMIDE 100 MG/10ML IV SOLN
INTRAVENOUS | Status: DC | PRN
  Administered 2016-11-13: 50 mg via INTRAVENOUS
  Administered 2016-11-13: 20 mg via INTRAVENOUS

## 2016-11-13 MED ORDER — LATANOPROST 0.005 % OP SOLN
1.0000 [drp] | Freq: Every day | OPHTHALMIC | Status: DC
  Administered 2016-11-13: 1 [drp] via OPHTHALMIC
  Filled 2016-11-13: qty 2.5

## 2016-11-13 MED ORDER — LIDOCAINE HCL (PF) 2 % IJ SOLN
INTRAMUSCULAR | Status: AC
  Filled 2016-11-13: qty 2

## 2016-11-13 MED ORDER — ONDANSETRON HCL 4 MG/2ML IJ SOLN
INTRAMUSCULAR | Status: AC
  Filled 2016-11-13: qty 2

## 2016-11-13 MED ORDER — FAMOTIDINE 20 MG PO TABS
20.0000 mg | ORAL_TABLET | Freq: Two times a day (BID) | ORAL | Status: DC
Start: 2016-11-13 — End: 2016-11-14
  Administered 2016-11-13: 20 mg via ORAL
  Filled 2016-11-13: qty 1

## 2016-11-13 MED ORDER — MEPERIDINE HCL 25 MG/ML IJ SOLN: 6.2500 mg | INTRAMUSCULAR | Status: DC | PRN

## 2016-11-13 SURGICAL SUPPLY — 52 items
APPLIER CLIP 5 13 M/L LIGAMAX5 (MISCELLANEOUS) ×3
BLADE SURG 15 STRL LF DISP TIS (BLADE) ×1 IMPLANT
BLADE SURG 15 STRL SS (BLADE) ×2
CANISTER SUCT 1200ML W/VALVE (MISCELLANEOUS) ×3 IMPLANT
CATH CHOLANGI 4FR 420404F (CATHETERS) IMPLANT
CHLORAPREP W/TINT 26ML (MISCELLANEOUS) ×3 IMPLANT
CLIP APPLIE 5 13 M/L LIGAMAX5 (MISCELLANEOUS) ×1 IMPLANT
CONRAY 60ML FOR OR (MISCELLANEOUS) ×3 IMPLANT
DERMABOND ADVANCED (GAUZE/BANDAGES/DRESSINGS) ×2
DERMABOND ADVANCED .7 DNX12 (GAUZE/BANDAGES/DRESSINGS) ×1 IMPLANT
DRAPE C-ARM XRAY 36X54 (DRAPES) IMPLANT
DRSG TEGADERM 2-3/8X2-3/4 SM (GAUZE/BANDAGES/DRESSINGS) ×6 IMPLANT
ELECT REM PT RETURN 9FT ADLT (ELECTROSURGICAL) ×3
ELECTRODE REM PT RTRN 9FT ADLT (ELECTROSURGICAL) ×1 IMPLANT
ENDOPOUCH RETRIEVER 10 (MISCELLANEOUS) ×3 IMPLANT
FILTER LAP SMOKE EVAC STRL (MISCELLANEOUS) ×3 IMPLANT
GLOVE SURG SYN 7.0 (GLOVE) ×6 IMPLANT
GLOVE SURG SYN 7.5  E (GLOVE) ×2
GLOVE SURG SYN 7.5 E (GLOVE) ×1 IMPLANT
GOWN STRL REUS W/ TWL LRG LVL3 (GOWN DISPOSABLE) ×3 IMPLANT
GOWN STRL REUS W/TWL LRG LVL3 (GOWN DISPOSABLE) ×6
HEMOSTAT SURGICEL 2X3 (HEMOSTASIS) ×6 IMPLANT
IRRIGATION STRYKERFLOW (MISCELLANEOUS) ×1 IMPLANT
IRRIGATOR STRYKERFLOW (MISCELLANEOUS) ×3
IV CATH ANGIO 12GX3 LT BLUE (NEEDLE) ×3 IMPLANT
IV NS 1000ML (IV SOLUTION)
IV NS 1000ML BAXH (IV SOLUTION) IMPLANT
JACKSON PRATT 10 (INSTRUMENTS) IMPLANT
L-HOOK LAP DISP 36CM (ELECTROSURGICAL) ×3
LABEL OR SOLS (LABEL) ×3 IMPLANT
LHOOK LAP DISP 36CM (ELECTROSURGICAL) ×1 IMPLANT
NDL HPO THNWL 1X22GA REG BVL (NEEDLE) ×1 IMPLANT
NDL SAFETY 22GX1.5 (NEEDLE) ×3 IMPLANT
NEEDLE SAFETY 22GX1 (NEEDLE) ×2
PACK LAP CHOLECYSTECTOMY (MISCELLANEOUS) ×3 IMPLANT
PENCIL ELECTRO HAND CTR (MISCELLANEOUS) ×3 IMPLANT
SCISSORS METZENBAUM CVD 33 (INSTRUMENTS) ×3 IMPLANT
SLEEVE ADV FIXATION 5X100MM (TROCAR) ×9 IMPLANT
SOL .9 NS 3000ML IRR  AL (IV SOLUTION) ×2
SOL .9 NS 3000ML IRR UROMATIC (IV SOLUTION) ×1 IMPLANT
SPONGE DRAIN TRACH 4X4 STRL 2S (GAUZE/BANDAGES/DRESSINGS) IMPLANT
SPONGE VERSALON 4X4 4PLY (MISCELLANEOUS) ×3 IMPLANT
SUT MNCRL 3 0 RB1 (SUTURE) ×1 IMPLANT
SUT MNCRL 4-0 (SUTURE) ×2
SUT MNCRL 4-0 27XMFL (SUTURE) ×1
SUT MONOCRYL 3 0 RB1 (SUTURE) ×2
SUT VICRYL 0 AB UR-6 (SUTURE) ×3 IMPLANT
SUTURE MNCRL 4-0 27XMF (SUTURE) ×1 IMPLANT
TROCAR 130MM GELPORT  DAV (MISCELLANEOUS) ×6 IMPLANT
TROCAR Z-THREAD OPTICAL 5X100M (TROCAR) ×3 IMPLANT
TUBING INSUF HEATED (TUBING) ×3 IMPLANT
TUBING INSUFFLATOR HI FLOW (MISCELLANEOUS) IMPLANT

## 2016-11-13 NOTE — Anesthesia Post-op Follow-up Note (Cosign Needed)
Anesthesia QCDR form completed.        

## 2016-11-13 NOTE — OR Nursing (Signed)
Bed available on 2C. Patient transported to rm 227 in stable condition.

## 2016-11-13 NOTE — Op Note (Signed)
Procedure Date:  11/13/2016  Pre-operative Diagnosis:  History of cholecystitis s/p cholecystostomy tube placement  Post-operative Diagnosis:  History of cholecystitis s/p cholecystostomy tube placement  Procedure:  Laparoscopic cholecystectomy with intraoperative cholangiogram  Surgeon:  Melvyn Neth, MD  Anesthesia:  General endotracheal  Estimated Blood Loss:  75 ml  Specimens:  gallbladder  Complications:  None  Indications for Procedure:  This is a 83 y.o. male who presents with a history of cholecystitis on 12/16 with elevated LFTs.  He was transferred to [facility] for possible ERCP, but MRCP revealed no choledocholithiasis.  Given his medical comorbidities, a percutaneous cholecystostomy tube was placed.  He presents for elective cholecystectomy. Given the history of elevated LFTs a cholangiogram will also be performed.  The benefits, complications, treatment options, and expected outcomes were discussed with the patient. The risks of bleeding, infection, recurrence of symptoms, failure to resolve symptoms, bile duct damage, bile duct leak, retained common bile duct stone, bowel injury, and need for further procedures were all discussed with the patient and he was willing to proceed.  Description of Procedure: The patient was correctly identified in the preoperative area and brought into the operating room.  The patient was placed supine with VTE prophylaxis in place.  Appropriate time-outs were performed.  Anesthesia was induced and the patient was intubated.  Appropriate antibiotics were infused. The percutaneous cholecystostomy tube was removed.  The abdomen was prepped and draped in a sterile fashion. An infraumbilical incision was made. A cutdown technique was used to enter the abdominal cavity without injury, and a Hasson trocar was inserted.  Pneumoperitoneum was obtained with appropriate opening pressures.  A 5-mm port was placed in the subxiphoid area and two 5-mm ports  were placed in the right upper quadrant under direct visualization.  The gallbladder was identified and found to be shriveled and scarred with significant adhesions to the omentum.  The fundus was grasped and retracted cephalad.  Adhesions were lysed bluntly and with electrocautery. This caused a small tear in the liver capsule which was cauterized with appropriate hemostasis. The infundibulum was grasped and retracted laterally, exposing the peritoneum overlying the gallbladder.  This was incised with electrocautery and extended on either side of the gallbladder.  The cystic duct was identified.  While dissecting the cystic duct, a ductotomy was created in the distal portion.  There was bile spillage from it.  On further inspection of the duct, the ductotomy was not close to the common bile duct.  A 14Fr angiocath was placed in the right upper quadrant and the cholangiogram catheter passed into the ductotomy without complications and secured with a clip.  The C-arm was then brought into the field and a cholangiogram was performed which on my evaluation showed no filling defects and some contrast trickling into the intestine.    After completion of the cholangiogram, the catheter was removed and the cystic duct was clipped twice proximally and cut in between.  The cystic artery was dissected carefully and clipped and cut in same fashion.  The gallbladder was taken from the gallbladder fossa in a retrograde fashion with electrocautery. The gallbladder was placed in an Endocatch bag and brought out via the umbilical incision. The liver bed was inspected and any bleeding was controlled with electrocautery. Two pieces of surgicel were placed on the liver bed for further hemostasis. The right upper quadrant was then inspected again revealing intact clips, no bleeding, and no ductal injury.  The area was thoroughly irrigated.  The 5  mm ports were removed under direct visualization and the Hasson trocar was removed.   The fascial opening was closed using 0 vicryl suture.  Local anesthetic was infused in all incisions and the incisions were closed with 4-0 Monocryl.  The wounds were cleaned and sealed with DermaBond.  The prior cholecystostomy tube site was dressed with gauze and tegaderm.  The patient was emerged from anesthesia and extubated and brought to the recovery room for further management.  The patient tolerated the procedure well and all counts were correct at the end of the case.   Melvyn Neth, MD

## 2016-11-13 NOTE — Discharge Instructions (Signed)

## 2016-11-13 NOTE — Interval H&P Note (Signed)
History and Physical Interval Note:  11/13/2016 11:32 AM  [name]  has presented today for surgery, with the diagnosis of history of cholecystitis with choledocholithiasis  The various methods of treatment have been discussed with the patient and family. After consideration of risks, benefits and other options for treatment, the patient has consented to  Procedure(s): LAPAROSCOPIC CHOLECYSTECTOMY WITH INTRAOPERATIVE CHOLANGIOGRAM (N/A) as a surgical intervention .  The patient's history has been reviewed, patient examined, no change in status, stable for surgery.  I have reviewed the patient's chart and labs.  Questions were answered to the patient's satisfaction.     Henry Lindsey

## 2016-11-13 NOTE — Anesthesia Preprocedure Evaluation (Signed)
Anesthesia Evaluation  Patient identified by MRN, date of birth, ID band Patient awake    Reviewed: Allergy & Precautions, NPO status , Patient's Chart, lab work & pertinent test results  History of Anesthesia Complications Negative for: history of anesthetic complications  Airway Mallampati: II  TM Distance: >3 FB Neck ROM: Full    Dental  (+) Poor Dentition   Pulmonary neg sleep apnea, neg COPD, former smoker,    breath sounds clear to auscultation- rhonchi (-) wheezing      Cardiovascular hypertension, Pt. on medications + CAD  (-) Past MI, (-) Cardiac Stents and (-) CABG  Rhythm:Regular Rate:Normal - Systolic murmurs and - Diastolic murmurs    Neuro/Psych CVA negative psych ROS   GI/Hepatic Neg liver ROS, GERD  ,  Endo/Other  diabetes, Insulin Dependent  Renal/GU Renal disease     Musculoskeletal  (+) Arthritis ,   Abdominal (+) - obese,   Peds  Hematology negative hematology ROS (+)   Anesthesia Other Findings Past Medical History: No date: BPH (benign prostatic hyperplasia) No date: Cancer ([place]) 11/2010: Carotid artery stenosis     Comment: <50% bilaterally 11/2010: CVA (cerebral infarction)     Comment: r thalamic lacunar No date: Diabetes mellitus No date: GERD (gastroesophageal reflux disease) No date: Hyperlipidemia No date: Hypertension No date: Neuromuscular disorder ([place]) 06/2012: Peripheral vascular disease in diabetes mellit*     Comment:  95% occlusion s/p PTCA R SFA Dew Sept 2013   Reproductive/Obstetrics                             Anesthesia Physical Anesthesia Plan  ASA: III  Anesthesia Plan: General   Post-op Pain Management:    Induction: Intravenous  Airway Management Planned: Oral ETT  Additional Equipment:   Intra-op Plan:   Post-operative Plan: Extubation in OR  Informed Consent: I have reviewed the patients History and Physical, chart,  labs and discussed the procedure including the risks, benefits and alternatives for the proposed anesthesia with the patient or authorized representative who has indicated his/her understanding and acceptance.   Dental advisory given  Plan Discussed with: CRNA and Anesthesiologist  Anesthesia Plan Comments:         Anesthesia Quick Evaluation

## 2016-11-13 NOTE — Transfer of Care (Signed)
Immediate Anesthesia Transfer of Care Note  Patient: Henry Lindsey  Procedure(s) Performed: Procedure(s): LAPAROSCOPIC CHOLECYSTECTOMY WITH INTRAOPERATIVE CHOLANGIOGRAM (N/A)  Patient Location: PACU  Anesthesia Type:General  Level of Consciousness: awake, alert , oriented and patient cooperative  Airway & Oxygen Therapy: Patient Spontanous Breathing and Patient connected to face mask oxygen  Post-op Assessment: Report given to RN, Post -op Vital signs reviewed and stable and Patient moving all extremities X 4  Post vital signs: Reviewed and stable  Last Vitals:  Vitals:   11/13/16 1112 11/13/16 1443  BP: (!) 148/66 131/64  Pulse: 61 63  Resp: 14 14  Temp: (!) 35.8 C 36.8 C    Last Pain:  Vitals:   11/13/16 1443  TempSrc:   PainSc: 0-No pain         Complications: No apparent anesthesia complications

## 2016-11-13 NOTE — Anesthesia Procedure Notes (Signed)
Procedure Name: Intubation Date/Time: 11/13/2016 12:06 PM Performed by: Silvana Newness Pre-anesthesia Checklist: Patient identified, Emergency Drugs available, Suction available, Patient being monitored and Timeout performed Patient Re-evaluated:Patient Re-evaluated prior to inductionOxygen Delivery Method: Circle system utilized Preoxygenation: Pre-oxygenation with 100% oxygen Intubation Type: IV induction Ventilation: Mask ventilation without difficulty Laryngoscope Size: Mac and 4 Grade View: Grade I Tube type: Oral Tube size: 7.5 mm Number of attempts: 1 Airway Equipment and Method: Stylet Placement Confirmation: ETT inserted through vocal cords under direct vision,  positive ETCO2 and breath sounds checked- equal and bilateral Secured at: 21 cm Tube secured with: Tape Dental Injury: Teeth and Oropharynx as per pre-operative assessment

## 2016-11-13 NOTE — Brief Op Note (Signed)
11/13/2016  2:37 PM  PATIENT:  Henry Lindsey  83 y.o. male  PRE-OPERATIVE DIAGNOSIS:  history of cholecystitis with choledocholithiasis  POST-OPERATIVE DIAGNOSIS:  history of cholecystitis with choledocholithiasis  PROCEDURE:  Procedure(s): LAPAROSCOPIC CHOLECYSTECTOMY WITH INTRAOPERATIVE CHOLANGIOGRAM (N/A)  SURGEON:  Surgeon(s) and Role:    * Olean Ree, MD - Primary  PHYSICIAN ASSISTANT:   ASSISTANTS: none   ANESTHESIA:   general  EBL:  Total I/O In: 900 [I.V.:900] Out: 75 [Blood:75]  BLOOD ADMINISTERED:none  DRAINS: none   LOCAL MEDICATIONS USED:  BUPIVICAINE   SPECIMEN:  Source of Specimen:  Gallbladder  DISPOSITION OF SPECIMEN:  PATHOLOGY  COUNTS:  YES  TOURNIQUET:  * No tourniquets in log *  DICTATION: .Dragon Dictation  PLAN OF CARE: Discharge to home after PACU  PATIENT DISPOSITION:  PACU - hemodynamically stable.   Delay start of Pharmacological VTE agent (>24hrs) due to surgical blood loss or risk of bleeding: yes

## 2016-11-13 NOTE — OR Nursing (Signed)
Dr. Hampton Abbot into see patient and his family and discussed admitting patient over night so he can monitor his blood work in the am. Patient and his family are in agreement to stay over night.

## 2016-11-13 NOTE — H&P (View-Only) (Signed)
11/01/2016  History of Present Illness: Henry Lindsey is a 83 y.o. male with a recent episode of cholecystitis and possible choledocholithiasis. He had been transferred over to [facility] for possible ERCP. MRCP revealed no choledocholithiasis any further and his LFTs were improving. He cholecystostomy tube was placed. He was recently admitted to [facility] with recurrent right upper quadrant pain and was noted to have mild surrounding stranding around the gallbladder but otherwise the drain was in correct position. He was discharged to home and is close to completing a course of antibiotics. He presents today for further evaluation for possible surgical management in the near future.  He currently denies any abdominal pain and reports that he is eating well without any nausea or vomiting. The drain still working and is in place without any significant tenderness although he does report some soreness around the insertion site. Otherwise denies any fevers, chills, chest pain, shortness of breath. Reports that he still trying to remain active and doing pushups.  Past Medical History: Past Medical History:  Diagnosis Date  . BPH (benign prostatic hyperplasia)   . Cancer ([place])   . Carotid artery stenosis 11/2010   <50% bilaterally  . CVA (cerebral infarction) 11/2010   r thalamic lacunar  . Diabetes mellitus   . Hyperlipidemia   . Hypertension   . Neuromuscular disorder ([place])   . Peripheral vascular disease in diabetes mellitus ([place]) 06/2012    95% occlusion s/p PTCA R SFA Dew Sept 2013     Past Surgical History: Past Surgical History:  Procedure Laterality Date  . IR GENERIC HISTORICAL  10/06/2016   IR PERC CHOLECYSTOSTOMY 10/06/2016 Aletta Edouard, MD [facility]-INTERV RAD  . JOINT REPLACEMENT Left 2014   left knee  . UPPER GASTROINTESTINAL ENDOSCOPY      Home Medications: Prior to Admission medications   Medication Sig Start Date End Date Taking? Authorizing Provider   Alum & Mag Hydroxide-Simeth (MAGIC MOUTHWASH) SOLN Take 5 mLs by mouth 3 (three) times daily as needed for mouth pain.   Yes Historical Provider, MD  amLODipine (NORVASC) 10 MG tablet  09/26/16  Yes Historical Provider, MD  amoxicillin-clavulanate (AUGMENTIN) 875-125 MG tablet Take 1 tablet by mouth every 12 (twelve) hours. 10/23/16  Yes Nicholes Mango, MD  aspirin 325 MG tablet Take 325 mg by mouth daily.   Yes Historical Provider, MD  atorvastatin (LIPITOR) 40 MG tablet TAKE 1 TABLET BY MOUTH EVERY DAY 07/15/16  Yes [name], MD  Blood Glucose Monitoring Suppl (ONE TOUCH ULTRA SYSTEM KIT) w/Device KIT 1 kit by Does not apply route once. Use DX code E11.59 10/18/15  Yes [name], MD  cilostazol (PLETAL) 100 MG tablet TAKE 1 TABLET(100 MG) BY MOUTH TWICE DAILY 08/13/16  Yes [name], MD  cyclobenzaprine (FLEXERIL) 5 MG tablet Take 1 tablet (5 mg total) by mouth 3 (three) times daily as needed for muscle spasms. 09/10/16  Yes [name], MD  famotidine (PEPCID) 20 MG tablet TAKE 1 TABLET(20 MG) BY MOUTH TWICE DAILY 05/08/16  Yes [name], MD  feeding supplement, ENSURE ENLIVE, (ENSURE ENLIVE) LIQD Take 237 mLs by mouth daily. 10/23/16 11/22/16 Yes Nicholes Mango, MD  fenofibrate micronized (LOFIBRA) 134 MG capsule TAKE 1 CAPSULE BY MOUTH EVERY MORNING BEFORE BREAKFAST 08/28/16  Yes [name], MD  finasteride (PROSCAR) 5 MG tablet Take 1 tablet (5 mg total) by mouth daily. 05/04/15  Yes [name], MD  gabapentin (NEURONTIN) 400 MG  capsule TAKE 1 CAPSULE(400 MG) BY MOUTH THREE TIMES DAILY 09/09/16  Yes [name], MD  glipiZIDE (GLUCOTROL) 10 MG tablet TAKE 1 TABLET(10 MG) BY MOUTH TWICE DAILY 03/27/16  Yes [name], MD  glucose blood test strip Use as instructed three times daily 05/03/16  Yes [name], MD  HYDROcodone-acetaminophen (NORCO/VICODIN) 5-325 MG tablet One tablet two times daily as needed for knee pain 10/31/16  Yes [name], MD   HYDROcodone-acetaminophen (NORCO/VICODIN) 5-325 MG tablet One tablet two times daily as needed for knee pain 10/31/16  Yes [name], MD  INS SYRINGE/NEEDLE 1CC/28G (B-D INSULIN SYRINGE 1CC/28G) 28G X 1/2" 1 ML MISC USE TO ADMINISTER INSULIN DAILY 02/13/16  Yes [name], MD  insulin lispro protamine-lispro (HUMALOG MIX 75/25) (75-25) 100 UNIT/ML SUSP injection Inject 10 units right before  breakfast and 6 units before dinner 05/03/16  Yes [name], MD  JANUVIA 100 MG tablet TAKE 1 TABLET(100 MG) BY MOUTH DAILY. NOTE DOSE INCREASE 08/13/16  Yes [name], MD  latanoprost (XALATAN) 0.005 % ophthalmic solution INSTILL 1 DROP INTO BOTH EYES QD 11/01/15  Yes Historical Provider, MD  meloxicam (MOBIC) 15 MG tablet  09/29/16  Yes Historical Provider, MD  metFORMIN (GLUCOPHAGE) 1000 MG tablet TAKE 1 TABLET BY MOUTH TWICE DAILY 07/26/16  Yes [name], MD  metoprolol succinate (TOPROL-XL) 100 MG 24 hr tablet TAKE 1 TABLET BY MOUTH ONCE DAILY 09/03/13  Yes [name], MD  metroNIDAZOLE (FLAGYL) 500 MG tablet  10/08/16  Yes Historical Provider, MD  nitroGLYCERIN (NITROSTAT) 0.4 MG SL tablet Place 1 tablet (0.4 mg total) under the tongue every 5 (five) minutes as needed for chest pain. MAXIMUM 3 TABLETS 10/04/16  Yes [name], MD  ondansetron (ZOFRAN) 4 MG tablet Take 1 tablet (4 mg total) by mouth every 8 (eight) hours as needed for nausea or vomiting. 09/03/16  Yes [name], MD  [facility] DELICA LANCETS 23J MISC Use three times daily to check blood sugar. 10/18/15  Yes [name], MD  pantoprazole (PROTONIX) 40 MG tablet Take 1 tablet (40 mg total) by mouth 2 (two) times daily before a meal. 10/04/16  Yes [name], MD  tamsulosin (FLOMAX) 0.4 MG CAPS capsule Take 0.4 mg by mouth daily after breakfast.   Yes Historical Provider, MD  traMADol (ULTRAM) 50 MG tablet Take 50 mg by mouth every 6 (six) hours as needed.  11/24/13  Yes Historical Provider, MD     Allergies: No Known Allergies  Social History:  reports that he quit smoking about 48 years ago. His smoking use included Cigarettes. He started smoking about 81 years ago. He has a 16.50 pack-year smoking history. He has never used smokeless tobacco. He reports that he does not drink alcohol or use drugs.   Family History: Family History  Problem Relation Age of Onset  . Diabetes Mother     Review of Systems: Review of Systems  Constitutional: Negative for chills and fever.  HENT: Negative for hearing loss.   Eyes: Negative for blurred vision.  Respiratory: Negative for cough and shortness of breath.   Cardiovascular: Negative for chest pain and leg swelling.  Gastrointestinal: Negative for abdominal pain, heartburn, nausea and vomiting.  Genitourinary: Negative for dysuria and hematuria.  Musculoskeletal: Negative for myalgias.  Skin: Negative for rash.  Neurological: Negative for dizziness.  Psychiatric/Behavioral: Negative for depression.    Physical Exam BP (!) 146/76  Pulse (!) 113   Temp 98.1 F (36.7 C) (Oral)   Ht _0  (1.803 m)   Wt 80 kg (176 lb 6.4 oz)   BMI 24.60 kg/m  CONSTITUTIONAL: No acute distress HEENT:  Normocephalic, atraumatic, extraocular motion intact. NECK: Trachea is midline, and there is no jugular venous distension.  RESPIRATORY:  Lungs are clear, and breath sounds are equal bilaterally. Normal respiratory effort without pathologic use of accessory muscles. CARDIOVASCULAR: Heart is regular without murmurs, gallops, or rubs. GI: The abdomen is soft, nondistended, nontender to palpation. Patient has a right-sided percutaneous cholecystostomy tube in place draining bilious fluid. There is no evidence of infection around the insertion site of the tube. MUSCULOSKELETAL:  Normal muscle strength and tone in all four extremities.  No peripheral edema or cyanosis. SKIN: Skin turgor is normal. There are no pathologic skin lesions.  NEUROLOGIC:   Motor and sensation is grossly normal.  Cranial nerves are grossly intact. PSYCH:  Alert and oriented to person, place and time. Affect is normal.  Labs/Imaging: LFTs yesterday showed a total bilirubin of 0.7, AST 17 AST 16 alkaline phosphatase 65.  Cholangiogram done through the cholecystostomy tube yesterday showed contrast passing into the common bile duct with no filling defects and going into the duodenum.  Assessment and Plan: This is a 83 y.o. male who presents with a history of recent cholecystitis with choledocholithiasis status post percutaneous cholecystostomy tube placement. Currently there are no filling defects in the common bile duct and the drain is working well. LFTs are normalized.  Patient is asking about surgical management for his cholecystitis. Currently he is due to see his cardiologist next week. I have informed the patient that if his cardiologist clears him for surgery that we can tentatively book him for 1/24 for cholecystectomy with cholangiogram. If his cardiologist requires any further testing prior to surgery to assess his cardiac risk then we would postpone the surgery if need be.   Patient should complete his antibiotic course.  The patient also has been informed that for his surgery he should stop his aspirin and Pletal 5 days prior to.  The patient does understand this plan and all of his questions have been answered   Melvyn Neth, [place]

## 2016-11-13 NOTE — Progress Notes (Signed)
11/13/16  After final read of the cholangiogram, there is concern for possible distal filling defect in the common bile duct, despite of trickling of contrast into the duodenum.  After discussing with the patient, decision was made to admit the patient overnight for observation.  LFTs will be ordered now and in the morning for comparison.  The patient is aware that if the LFTs are rising, then he may need a HIDA scan vs possible ERCP.  If LFTs are normal, then he may be discharged to home in the morning.  The patient and his family understand this plan and appreciate the caution taken given the cholangiogram read.  All questions have been answered.   Melvyn Neth, [place]

## 2016-11-14 ENCOUNTER — Encounter: Payer: Self-pay | Admitting: Surgery

## 2016-11-14 ENCOUNTER — Other Ambulatory Visit: Payer: Medicare Other

## 2016-11-14 DIAGNOSIS — K8012 Calculus of gallbladder with acute and chronic cholecystitis without obstruction: Secondary | ICD-10-CM | POA: Diagnosis not present

## 2016-11-14 DIAGNOSIS — K810 Acute cholecystitis: Secondary | ICD-10-CM | POA: Diagnosis not present

## 2016-11-14 LAB — GLUCOSE, CAPILLARY: Glucose-Capillary: 177 mg/dL — ABNORMAL HIGH (ref 65–99)

## 2016-11-14 LAB — COMPREHENSIVE METABOLIC PANEL
ALBUMIN: 3 g/dL — AB (ref 3.5–5.0)
ALK PHOS: 231 U/L — AB (ref 38–126)
ALT: 212 U/L — ABNORMAL HIGH (ref 17–63)
ANION GAP: 6 (ref 5–15)
AST: 179 U/L — ABNORMAL HIGH (ref 15–41)
BILIRUBIN TOTAL: 0.6 mg/dL (ref 0.3–1.2)
BUN: 12 mg/dL (ref 6–20)
CALCIUM: 8.2 mg/dL — AB (ref 8.9–10.3)
CO2: 30 mmol/L (ref 22–32)
Chloride: 103 mmol/L (ref 101–111)
Creatinine, Ser: 0.91 mg/dL (ref 0.61–1.24)
GFR calc Af Amer: >60 mL/min (ref >60)
GFR calc non Af Amer: >60 mL/min (ref >60)
GLUCOSE: 195 mg/dL — AB (ref 65–99)
Potassium: 4 mmol/L (ref 3.5–5.1)
Sodium: 139 mmol/L (ref 135–145)
TOTAL PROTEIN: 5.6 g/dL — AB (ref 6.5–8.1)

## 2016-11-14 NOTE — Discharge Summary (Signed)
Patient ID: Henry Lindsey MRN: [id number] DOB/AGE: 1933/08/06 83 y.o.  Admit date: 11/13/2016 Discharge date: 11/14/2016   Discharge Diagnoses:  Active Problems:   Acute cholecystitis   Procedures:  Laparoscopic cholecystectomy with intraoperative cholangiogram  Hospital Course: Patient underwent a laparoscopic cholecystectomy with intraoperative cholangiogram on 1/24. The cholangiogram read was concerning for possible distal common bile duct obstruction. The patient was admitted for overnight observation. His total bilirubin was normal the day of surgery as well as some postoperative day #1. His diet was advanced slowly. He was tolerating a diet with no nausea or vomiting. His pain was well-controlled. He was discharged to home in good condition.  Consults: None  Disposition: 01-Home or Self Care  Discharge Instructions    Call MD for:  difficulty breathing, headache or visual disturbances    Complete by:  As directed    Call MD for:  difficulty breathing, headache or visual disturbances    Complete by:  As directed    Call MD for:  persistant nausea and vomiting    Complete by:  As directed    Call MD for:  persistant nausea and vomiting    Complete by:  As directed    Call MD for:  redness, tenderness, or signs of infection (pain, swelling, redness, odor or green/yellow discharge around incision site)    Complete by:  As directed    Call MD for:  redness, tenderness, or signs of infection (pain, swelling, redness, odor or green/yellow discharge around incision site)    Complete by:  As directed    Call MD for:  severe uncontrolled pain    Complete by:  As directed    Call MD for:  severe uncontrolled pain    Complete by:  As directed    Call MD for:  temperature >100.4    Complete by:  As directed    Call MD for:  temperature >100.4    Complete by:  As directed    Change dressing (specify)    Complete by:  As directed    Dry gauze dressing to prior drain site once daily  until wound closes.   Change dressing (specify)    Complete by:  As directed    Apply dry gauze dressing to the prior drain site daily and as needed until the wound closes.   Diet - low sodium heart healthy    Complete by:  As directed    Diet - low sodium heart healthy    Complete by:  As directed    Discharge instructions    Complete by:  As directed    Patient may shower but do not scrub wounds heavily and dab dry only.  Apply dry gauze dressing to previous drain site daily until wound closes.  The other incisions do not need dressings.  Do not submerge wounds in pool or tub.  Please wait until 1/26 to resume your Aspirin.   Discharge instructions    Complete by:  As directed    Patient may shower but do not scrub wounds heavily and dab dry only.  Do not submerge wounds in pool/tub.   Driving Restrictions    Complete by:  As directed    Do not drive while taking narcotics for pain control.   Driving Restrictions    Complete by:  As directed    Do not drive while taking narcotics for pain control   Increase activity slowly    Complete by:  As directed  Increase activity slowly    Complete by:  As directed    Lifting restrictions    Complete by:  As directed    No heavy lifting of more than 10-15 lbs for 4 weeks.   Lifting restrictions    Complete by:  As directed    No heavy lifting of more than 10-15 pounds for 4 weeks     Allergies as of 11/14/2016   No Known Allergies     Medication List    STOP taking these medications   cyclobenzaprine 5 MG tablet Commonly known as:  FLEXERIL     TAKE these medications   amLODipine 10 MG tablet Commonly known as:  NORVASC Take 1 tablet (10 mg total) by mouth daily.   aspirin 81 MG tablet Take 1 tablet (81 mg total) by mouth daily.   atorvastatin 40 MG tablet Commonly known as:  LIPITOR TAKE 1 TABLET BY MOUTH EVERY DAY   cilostazol 100 MG tablet Commonly known as:  PLETAL TAKE 1 TABLET(100 MG) BY MOUTH TWICE DAILY    famotidine 20 MG tablet Commonly known as:  PEPCID TAKE 1 TABLET(20 MG) BY MOUTH TWICE DAILY   feeding supplement (ENSURE ENLIVE) Liqd Take 237 mLs by mouth daily. What changed:  when to take this   fenofibrate micronized 134 MG capsule Commonly known as:  LOFIBRA TAKE 1 CAPSULE BY MOUTH EVERY MORNING BEFORE BREAKFAST   gabapentin 400 MG capsule Commonly known as:  NEURONTIN TAKE 1 CAPSULE(400 MG) BY MOUTH THREE TIMES DAILY   glipiZIDE 10 MG tablet Commonly known as:  GLUCOTROL TAKE 1 TABLET(10 MG) BY MOUTH TWICE DAILY   glucose blood test strip Use as instructed three times daily   HYDROcodone-acetaminophen 5-325 MG tablet Commonly known as:  NORCO/VICODIN One tablet two times daily as needed for knee pain What changed:  how much to take  how to take this  when to take this  additional instructions   HYDROcodone-acetaminophen 5-325 MG tablet Commonly known as:  NORCO/VICODIN Take 1-2 tablets by mouth every 6 (six) hours as needed for moderate pain. What changed:  You were already taking a medication with the same name, and this prescription was added. Make sure you understand how and when to take each.   INS SYRINGE/NEEDLE 1CC/28G 28G X 1/2" 1 ML Misc Commonly known as:  B-D INSULIN SYRINGE 1CC/28G USE TO ADMINISTER INSULIN DAILY   insulin lispro protamine-lispro (75-25) 100 UNIT/ML Susp injection Commonly known as:  HUMALOG MIX 75/25 Inject 10 units right before  breakfast and 6 units before dinner   JANUVIA 100 MG tablet Generic drug:  sitaGLIPtin TAKE 1 TABLET(100 MG) BY MOUTH DAILY. NOTE DOSE INCREASE   latanoprost 0.005 % ophthalmic solution Commonly known as:  XALATAN INSTILL 1 DROP INTO BOTH EYES QD   magic mouthwash Soln Take 5 mLs by mouth 3 (three) times daily as needed for mouth pain.   meloxicam 15 MG tablet Commonly known as:  MOBIC Take 15 mg by mouth daily.   metFORMIN 1000 MG tablet Commonly known as:  GLUCOPHAGE TAKE 1 TABLET BY  MOUTH TWICE DAILY   metoprolol succinate 100 MG 24 hr tablet Commonly known as:  TOPROL-XL Take 1 tablet (100 mg total) by mouth daily. Take with or immediately following a meal.   multivitamin with minerals Tabs tablet Take 1 tablet by mouth daily.   nitroGLYCERIN 0.4 MG SL tablet Commonly known as:  NITROSTAT Place 1 tablet (0.4 mg total) under the tongue every 5 (five)  minutes as needed for chest pain. MAXIMUM 3 TABLETS   ondansetron 4 MG tablet Commonly known as:  ZOFRAN Take 1 tablet (4 mg total) by mouth every 8 (eight) hours as needed for nausea or vomiting.   ONE TOUCH ULTRA SYSTEM KIT w/Device Kit 1 kit by Does not apply route once. Use DX code U99.06   [facility] DELICA LANCETS 89N Misc Use three times daily to check blood sugar.   pantoprazole 40 MG tablet Commonly known as:  PROTONIX TAKE 1 TABLET(40 MG) BY MOUTH TWICE DAILY BEFORE A MEAL   tamsulosin 0.4 MG Caps capsule Commonly known as:  FLOMAX Take 0.4 mg by mouth daily after breakfast.   traMADol 50 MG tablet Commonly known as:  ULTRAM Take 50 mg by mouth at bedtime.      Follow-up Advance, MD Follow up in 2 week(s).   Specialty:  Surgery Why:  follow up 11/27/16 at 10:30am with Dr. Barnie Mort information: [address] [id number] [phone]

## 2016-11-14 NOTE — Progress Notes (Signed)
Pt A and O x 4. VSS. Pt tolerating diet well. minimal complaints of pain  No nausea. IV removed intact, prescriptions given. Pt voiced understanding of discharge instructions with no further questions. Pt discharged via wheelchair with axillary.

## 2016-11-14 NOTE — Anesthesia Postprocedure Evaluation (Signed)
Anesthesia Post Note  Patient: BERN MEHALIC  Procedure(s) Performed: Procedure(s) (LRB): LAPAROSCOPIC CHOLECYSTECTOMY WITH INTRAOPERATIVE CHOLANGIOGRAM (N/A)  Patient location during evaluation: PACU Anesthesia Type: General Level of consciousness: awake and alert Pain management: pain level controlled Vital Signs Assessment: post-procedure vital signs reviewed and stable Respiratory status: spontaneous breathing, nonlabored ventilation, respiratory function stable and patient connected to nasal cannula oxygen Cardiovascular status: blood pressure returned to baseline and stable Postop Assessment: no signs of nausea or vomiting Anesthetic complications: no     Last Vitals:  Vitals:   11/13/16 1920 11/14/16 0414  BP: (!) 135/56 129/61  Pulse: (!) 58 64  Resp: 18 18  Temp: (!) 35.8 C 36.6 C    Last Pain:  Vitals:   11/14/16 0815  TempSrc:   PainSc: 6                  Precious Haws Piscitello

## 2016-11-14 NOTE — Progress Notes (Signed)
1 Day Post-Op   Subjective:  Patient reports no acute events overnight. He has some discomfort in the right upper quadrant but states it is improving.  Vital signs in last 24 hours: Temp:  [96.4 F (35.8 C)-98.2 F (36.8 C)] 97.8 F (36.6 C) (01/25 0414) Pulse Rate:  [56-64] 64 (01/25 0414) Resp:  [13-22] 18 (01/25 0414) BP: (126-148)/(47-67) 129/61 (01/25 0414) SpO2:  [90 %-98 %] 92 % (01/25 0414) Weight:  [79.3 kg (174 lb 14.4 oz)] 79.3 kg (174 lb 14.4 oz) (01/24 1754) Last BM Date: 11/13/16  Intake/Output from previous day: 01/24 0701 - 01/25 0700 In: 2246 [P.O.:420; I.V.:1826] Out: 1350 [Urine:1275; Blood:75]  GI: Abdomen is soft, nondistended, appropriately tender to palpation at the incision sites.  Lab Results:  CBC No results for input(s): WBC, HGB, HCT, PLT in the last 72 hours. CMP     Component Value Date/Time   NA 139 11/14/2016 0455   NA 136 08/27/2012 0453   K 4.0 11/14/2016 0455   K 4.6 08/27/2012 0453   CL 103 11/14/2016 0455   CL 103 08/27/2012 0453   CO2 30 11/14/2016 0455   CO2 27 08/27/2012 0453   GLUCOSE 195 (H) 11/14/2016 0455   GLUCOSE 144 (H) 08/27/2012 0453   BUN 12 11/14/2016 0455   BUN 20 (H) 08/27/2012 0453   CREATININE 0.91 11/14/2016 0455   CREATININE 1.07 08/27/2012 0453   CREATININE 1.19 04/07/2012 1619   CALCIUM 8.2 (L) 11/14/2016 0455   CALCIUM 7.9 (L) 08/27/2012 0453   PROT 5.6 (L) 11/14/2016 0455   PROT 7.0 03/29/2012 1605   ALBUMIN 3.0 (L) 11/14/2016 0455   ALBUMIN 3.5 03/29/2012 1605   AST 179 (H) 11/14/2016 0455   AST 18 03/29/2012 1605   ALT 212 (H) 11/14/2016 0455   ALT 25 03/29/2012 1605   ALKPHOS 231 (H) 11/14/2016 0455   ALKPHOS 74 03/29/2012 1605   BILITOT 0.6 11/14/2016 0455   BILITOT 0.7 03/29/2012 1605   GFRNONAA >60 11/14/2016 0455   GFRNONAA >60 08/27/2012 0453   GFRNONAA 59 (L) 04/07/2012 1619   GFRAA >60 11/14/2016 0455   GFRAA >60 08/27/2012 0453   GFRAA 68 04/07/2012 1619   PT/INR No results for  input(s): LABPROT, INR in the last 72 hours.  Studies/Results: Dg Cholangiogram Operative  Result Date: 11/13/2016 CLINICAL DATA:  Cholecystectomy. History of percutaneous cholecystostomy tube. EXAM: INTRAOPERATIVE CHOLANGIOGRAM TECHNIQUE: Cholangiographic images from the C-arm fluoroscopic device were submitted for interpretation post-operatively. Please see the procedural report for the amount of contrast and the fluoroscopy time utilized. COMPARISON:  Cholecystostomy tube injection 10/29/2016 FINDINGS: Opacification of the intrahepatic and extrahepatic biliary system. There is moderate dilatation of the biliary system. Question minimal drainage into the duodenum. There appears to be extravasation of contrast near the injection site. Cannot exclude sludge within the common bile duct. IMPRESSION: Dilatation of the biliary system without significant drainage into the duodenum. Question minimal filling of the duodenum. Findings are concerning for narrowing or obstruction in the distal common bile duct. Electronically Signed   By: Markus Daft M.D.   On: 11/13/2016 14:34    Assessment/Plan: 83 year old male status post laparoscopic cholecystectomy. Doing well. LFTs are trending down. Discussed with the patient and he will likely be able be discharged home later this morning after being seen by his operative surgeon.   Clayburn Pert, MD FACS General Surgeon  11/14/2016

## 2016-11-14 NOTE — Care Management Obs Status (Signed)
[place] NOTIFICATION   Patient Details  Name: Henry Lindsey MRN: [id number] Date of Birth: 1933/03/27   Medicare Observation Status Notification Given:  No (Admitted observation less than 24 hours)    Beverly Sessions, RN 11/14/2016, 9:50 AM

## 2016-11-15 ENCOUNTER — Emergency Department: Payer: Medicare Other

## 2016-11-15 ENCOUNTER — Inpatient Hospital Stay (HOSPITAL_COMMUNITY)
Admission: AD | Admit: 2016-11-15 | Discharge: 2016-11-24 | DRG: 394 | Disposition: A | Discharge status: Home / Self Care | Payer: Medicare Other | Source: Other Acute Inpatient Hospital | Attending: Internal Medicine | Admitting: Internal Medicine

## 2016-11-15 ENCOUNTER — Emergency Department
Admission: EM | Admit: 2016-11-15 | Discharge: 2016-11-15 | Payer: Medicare Other | Attending: Emergency Medicine | Admitting: Emergency Medicine

## 2016-11-15 ENCOUNTER — Encounter (HOSPITAL_COMMUNITY): Payer: Self-pay | Admitting: Family Medicine

## 2016-11-15 ENCOUNTER — Encounter: Payer: Self-pay | Admitting: Emergency Medicine

## 2016-11-15 ENCOUNTER — Telehealth: Payer: Self-pay

## 2016-11-15 DIAGNOSIS — K831 Obstruction of bile duct: Secondary | ICD-10-CM | POA: Diagnosis not present

## 2016-11-15 DIAGNOSIS — K760 Fatty (change of) liver, not elsewhere classified: Secondary | ICD-10-CM | POA: Diagnosis present

## 2016-11-15 DIAGNOSIS — Z9049 Acquired absence of other specified parts of digestive tract: Secondary | ICD-10-CM | POA: Diagnosis present

## 2016-11-15 DIAGNOSIS — E440 Moderate protein-calorie malnutrition: Secondary | ICD-10-CM | POA: Insufficient documentation

## 2016-11-15 DIAGNOSIS — E785 Hyperlipidemia, unspecified: Secondary | ICD-10-CM | POA: Diagnosis present

## 2016-11-15 DIAGNOSIS — Z79899 Other long term (current) drug therapy: Secondary | ICD-10-CM

## 2016-11-15 DIAGNOSIS — I10 Essential (primary) hypertension: Secondary | ICD-10-CM | POA: Insufficient documentation

## 2016-11-15 DIAGNOSIS — K8050 Calculus of bile duct without cholangitis or cholecystitis without obstruction: Secondary | ICD-10-CM

## 2016-11-15 DIAGNOSIS — Z794 Long term (current) use of insulin: Secondary | ICD-10-CM

## 2016-11-15 DIAGNOSIS — Y836 Removal of other organ (partial) (total) as the cause of abnormal reaction of the patient, or of later complication, without mention of misadventure at the time of the procedure: Secondary | ICD-10-CM | POA: Diagnosis present

## 2016-11-15 DIAGNOSIS — E1151 Type 2 diabetes mellitus with diabetic peripheral angiopathy without gangrene: Secondary | ICD-10-CM | POA: Diagnosis present

## 2016-11-15 DIAGNOSIS — E1121 Type 2 diabetes mellitus with diabetic nephropathy: Secondary | ICD-10-CM

## 2016-11-15 DIAGNOSIS — Y832 Surgical operation with anastomosis, bypass or graft as the cause of abnormal reaction of the patient, or of later complication, without mention of misadventure at the time of the procedure: Secondary | ICD-10-CM | POA: Diagnosis present

## 2016-11-15 DIAGNOSIS — Z96652 Presence of left artificial knee joint: Secondary | ICD-10-CM | POA: Diagnosis present

## 2016-11-15 DIAGNOSIS — E876 Hypokalemia: Secondary | ICD-10-CM | POA: Diagnosis present

## 2016-11-15 DIAGNOSIS — K9189 Other postprocedural complications and disorders of digestive system: Principal | ICD-10-CM | POA: Diagnosis present

## 2016-11-15 DIAGNOSIS — K219 Gastro-esophageal reflux disease without esophagitis: Secondary | ICD-10-CM | POA: Diagnosis present

## 2016-11-15 DIAGNOSIS — I2510 Atherosclerotic heart disease of native coronary artery without angina pectoris: Secondary | ICD-10-CM | POA: Diagnosis present

## 2016-11-15 DIAGNOSIS — K838 Other specified diseases of biliary tract: Secondary | ICD-10-CM

## 2016-11-15 DIAGNOSIS — R1013 Epigastric pain: Secondary | ICD-10-CM | POA: Diagnosis present

## 2016-11-15 DIAGNOSIS — Z87891 Personal history of nicotine dependence: Secondary | ICD-10-CM | POA: Insufficient documentation

## 2016-11-15 DIAGNOSIS — E119 Type 2 diabetes mellitus without complications: Secondary | ICD-10-CM | POA: Diagnosis not present

## 2016-11-15 DIAGNOSIS — E1142 Type 2 diabetes mellitus with diabetic polyneuropathy: Secondary | ICD-10-CM | POA: Diagnosis present

## 2016-11-15 DIAGNOSIS — Z9861 Coronary angioplasty status: Secondary | ICD-10-CM | POA: Diagnosis not present

## 2016-11-15 DIAGNOSIS — Z9889 Other specified postprocedural states: Secondary | ICD-10-CM

## 2016-11-15 DIAGNOSIS — Z8673 Personal history of transient ischemic attack (TIA), and cerebral infarction without residual deficits: Secondary | ICD-10-CM | POA: Diagnosis not present

## 2016-11-15 DIAGNOSIS — R945 Abnormal results of liver function studies: Secondary | ICD-10-CM

## 2016-11-15 DIAGNOSIS — IMO0002 Reserved for concepts with insufficient information to code with codable children: Secondary | ICD-10-CM

## 2016-11-15 DIAGNOSIS — E1165 Type 2 diabetes mellitus with hyperglycemia: Secondary | ICD-10-CM | POA: Diagnosis present

## 2016-11-15 DIAGNOSIS — Z833 Family history of diabetes mellitus: Secondary | ICD-10-CM | POA: Diagnosis not present

## 2016-11-15 DIAGNOSIS — K315 Obstruction of duodenum: Secondary | ICD-10-CM | POA: Diagnosis present

## 2016-11-15 DIAGNOSIS — Z6823 Body mass index (BMI) 23.0-23.9, adult: Secondary | ICD-10-CM

## 2016-11-15 DIAGNOSIS — R7989 Other specified abnormal findings of blood chemistry: Secondary | ICD-10-CM

## 2016-11-15 DIAGNOSIS — I739 Peripheral vascular disease, unspecified: Secondary | ICD-10-CM

## 2016-11-15 DIAGNOSIS — R109 Unspecified abdominal pain: Secondary | ICD-10-CM

## 2016-11-15 DIAGNOSIS — R1084 Generalized abdominal pain: Secondary | ICD-10-CM | POA: Diagnosis not present

## 2016-11-15 DIAGNOSIS — Z7982 Long term (current) use of aspirin: Secondary | ICD-10-CM

## 2016-11-15 DIAGNOSIS — K668 Other specified disorders of peritoneum: Secondary | ICD-10-CM

## 2016-11-15 DIAGNOSIS — R140 Abdominal distension (gaseous): Secondary | ICD-10-CM

## 2016-11-15 DIAGNOSIS — T8189XA Other complications of procedures, not elsewhere classified, initial encounter: Secondary | ICD-10-CM

## 2016-11-15 LAB — URINALYSIS, COMPLETE (UACMP) WITH MICROSCOPIC
BACTERIA UA: NONE SEEN
GLUCOSE, UA: 500 mg/dL — AB
KETONES UR: 15 mg/dL — AB
LEUKOCYTES UA: NEGATIVE
Nitrite: NEGATIVE
PROTEIN: 100 mg/dL — AB
Specific Gravity, Urine: 1.02 (ref 1.005–1.030)
pH: 7 (ref 5.0–8.0)

## 2016-11-15 LAB — COMPREHENSIVE METABOLIC PANEL
ALT: 247 U/L — ABNORMAL HIGH (ref 17–63)
ANION GAP: 11 (ref 5–15)
AST: 162 U/L — ABNORMAL HIGH (ref 15–41)
Albumin: 3.8 g/dL (ref 3.5–5.0)
Alkaline Phosphatase: 341 U/L — ABNORMAL HIGH (ref 38–126)
BUN: 8 mg/dL (ref 6–20)
CHLORIDE: 97 mmol/L — AB (ref 101–111)
CO2: 28 mmol/L (ref 22–32)
CREATININE: 0.68 mg/dL (ref 0.61–1.24)
Calcium: 9.2 mg/dL (ref 8.9–10.3)
Glucose, Bld: 183 mg/dL — ABNORMAL HIGH (ref 65–99)
Potassium: 3.7 mmol/L (ref 3.5–5.1)
SODIUM: 136 mmol/L (ref 135–145)
Total Bilirubin: 3.8 mg/dL — ABNORMAL HIGH (ref 0.3–1.2)
Total Protein: 7.6 g/dL (ref 6.5–8.1)

## 2016-11-15 LAB — LIPASE, BLOOD: LIPASE: 55 U/L — AB (ref 11–51)

## 2016-11-15 LAB — SURGICAL PATHOLOGY

## 2016-11-15 LAB — CBC
HCT: 38.3 % — ABNORMAL LOW (ref 40.0–52.0)
HEMOGLOBIN: 12.7 g/dL — AB (ref 13.0–18.0)
MCH: 27.4 pg (ref 26.0–34.0)
MCHC: 33.1 g/dL (ref 32.0–36.0)
MCV: 82.8 fL (ref 80.0–100.0)
PLATELETS: 298 10*3/uL (ref 150–440)
RBC: 4.63 MIL/uL (ref 4.40–5.90)
RDW: 15 % — ABNORMAL HIGH (ref 11.5–14.5)
WBC: 14.4 10*3/uL — AB (ref 3.8–10.6)

## 2016-11-15 LAB — GLUCOSE, CAPILLARY: Glucose-Capillary: 143 mg/dL — ABNORMAL HIGH (ref 65–99)

## 2016-11-15 LAB — TROPONIN I

## 2016-11-15 MED ORDER — ENOXAPARIN SODIUM 40 MG/0.4ML ~~LOC~~ SOLN
40.0000 mg | Freq: Every day | SUBCUTANEOUS | Status: DC
  Administered 2016-11-15: 40 mg via SUBCUTANEOUS
  Filled 2016-11-15: qty 0.4

## 2016-11-15 MED ORDER — PIPERACILLIN-TAZOBACTAM 3.375 G IVPB 30 MIN
3.3750 g | Freq: Once | INTRAVENOUS | Status: AC
  Administered 2016-11-15: 3.375 g via INTRAVENOUS
  Filled 2016-11-15: qty 50

## 2016-11-15 MED ORDER — SENNA 8.6 MG PO TABS
1.0000 | ORAL_TABLET | Freq: Two times a day (BID) | ORAL | Status: DC
  Administered 2016-11-16 – 2016-11-24 (×13): 8.6 mg via ORAL
  Filled 2016-11-15 (×18): qty 1

## 2016-11-15 MED ORDER — FENTANYL CITRATE (PF) 100 MCG/2ML IJ SOLN
25.0000 ug | INTRAMUSCULAR | Status: DC | PRN
  Filled 2016-11-15: qty 2

## 2016-11-15 MED ORDER — GABAPENTIN 400 MG PO CAPS
400.0000 mg | ORAL_CAPSULE | Freq: Three times a day (TID) | ORAL | Status: DC
  Administered 2016-11-16 – 2016-11-24 (×24): 400 mg via ORAL
  Filled 2016-11-15 (×25): qty 1

## 2016-11-15 MED ORDER — MAGIC MOUTHWASH: 5.0000 mL | Freq: Three times a day (TID) | ORAL | Status: DC | PRN

## 2016-11-15 MED ORDER — MORPHINE SULFATE (PF) 2 MG/ML IV SOLN
INTRAVENOUS | Status: AC
  Administered 2016-11-15: 4 mg via INTRAVENOUS
  Filled 2016-11-15: qty 2

## 2016-11-15 MED ORDER — ADULT MULTIVITAMIN W/MINERALS CH
1.0000 | ORAL_TABLET | Freq: Every day | ORAL | Status: DC
  Administered 2016-11-16 – 2016-11-24 (×9): 1 via ORAL
  Filled 2016-11-15 (×9): qty 1

## 2016-11-15 MED ORDER — ONDANSETRON HCL 4 MG/2ML IJ SOLN
4.0000 mg | Freq: Four times a day (QID) | INTRAMUSCULAR | Status: DC | PRN
  Administered 2016-11-16: 4 mg via INTRAVENOUS
  Filled 2016-11-15: qty 2

## 2016-11-15 MED ORDER — PANTOPRAZOLE SODIUM 40 MG PO TBEC
40.0000 mg | DELAYED_RELEASE_TABLET | Freq: Two times a day (BID) | ORAL | Status: DC
  Administered 2016-11-15 – 2016-11-24 (×18): 40 mg via ORAL
  Filled 2016-11-15 (×18): qty 1

## 2016-11-15 MED ORDER — SODIUM CHLORIDE 0.9 % IV SOLN
Freq: Once | INTRAVENOUS | Status: AC
  Administered 2016-11-15: 15:00:00 via INTRAVENOUS

## 2016-11-15 MED ORDER — POLYETHYLENE GLYCOL 3350 17 G PO PACK
17.0000 g | PACK | Freq: Every day | ORAL | Status: DC | PRN
  Administered 2016-11-17: 17 g via ORAL
  Filled 2016-11-15: qty 1

## 2016-11-15 MED ORDER — ATORVASTATIN CALCIUM 40 MG PO TABS
40.0000 mg | ORAL_TABLET | Freq: Every day | ORAL | Status: DC
  Administered 2016-11-17 – 2016-11-23 (×7): 40 mg via ORAL
  Filled 2016-11-15 (×8): qty 1

## 2016-11-15 MED ORDER — HYDROMORPHONE HCL 2 MG/ML IJ SOLN
0.5000 mg | INTRAMUSCULAR | Status: DC | PRN
  Administered 2016-11-15 – 2016-11-23 (×25): 1 mg via INTRAVENOUS
  Filled 2016-11-15 (×26): qty 1

## 2016-11-15 MED ORDER — VITAMIN D 1000 UNITS PO TABS
2000.0000 [IU] | ORAL_TABLET | Freq: Every day | ORAL | Status: DC
  Administered 2016-11-16 – 2016-11-24 (×8): 2000 [IU] via ORAL
  Filled 2016-11-15 (×8): qty 2

## 2016-11-15 MED ORDER — MORPHINE SULFATE (PF) 4 MG/ML IV SOLN
4.0000 mg | Freq: Once | INTRAVENOUS | Status: AC
  Administered 2016-11-15: 4 mg via INTRAVENOUS

## 2016-11-15 MED ORDER — METOPROLOL SUCCINATE ER 100 MG PO TB24
100.0000 mg | ORAL_TABLET | Freq: Every day | ORAL | Status: DC
Start: 2016-11-16 — End: 2016-11-24
  Administered 2016-11-16 – 2016-11-24 (×9): 100 mg via ORAL
  Filled 2016-11-15 (×9): qty 1

## 2016-11-15 MED ORDER — HYDROMORPHONE HCL 1 MG/ML IJ SOLN
1.0000 mg | Freq: Once | INTRAMUSCULAR | Status: AC
  Administered 2016-11-15: 1 mg via INTRAVENOUS
  Filled 2016-11-15: qty 1

## 2016-11-15 MED ORDER — FAMOTIDINE IN NACL 20-0.9 MG/50ML-% IV SOLN
20.0000 mg | Freq: Two times a day (BID) | INTRAVENOUS | Status: DC
  Administered 2016-11-15 – 2016-11-17 (×4): 20 mg via INTRAVENOUS
  Filled 2016-11-15 (×6): qty 50

## 2016-11-15 MED ORDER — FAMOTIDINE IN NACL 20-0.9 MG/50ML-% IV SOLN
20.0000 mg | Freq: Once | INTRAVENOUS | Status: AC
  Administered 2016-11-15: 20 mg via INTRAVENOUS
  Filled 2016-11-15: qty 50

## 2016-11-15 MED ORDER — ACETAMINOPHEN 325 MG PO TABS
650.0000 mg | ORAL_TABLET | Freq: Four times a day (QID) | ORAL | Status: DC | PRN
  Administered 2016-11-17: 650 mg via ORAL
  Filled 2016-11-15: qty 2

## 2016-11-15 MED ORDER — TAMSULOSIN HCL 0.4 MG PO CAPS
0.4000 mg | ORAL_CAPSULE | Freq: Every day | ORAL | Status: DC
  Administered 2016-11-16 – 2016-11-24 (×9): 0.4 mg via ORAL
  Filled 2016-11-15 (×9): qty 1

## 2016-11-15 MED ORDER — ONDANSETRON HCL 4 MG/2ML IJ SOLN
4.0000 mg | Freq: Once | INTRAMUSCULAR | Status: AC
  Administered 2016-11-15: 4 mg via INTRAVENOUS

## 2016-11-15 MED ORDER — LATANOPROST 0.005 % OP SOLN
1.0000 [drp] | Freq: Every day | OPHTHALMIC | Status: DC
  Administered 2016-11-15 – 2016-11-23 (×9): 1 [drp] via OPHTHALMIC
  Filled 2016-11-15: qty 2.5

## 2016-11-15 MED ORDER — BISACODYL 5 MG PO TBEC
5.0000 mg | DELAYED_RELEASE_TABLET | Freq: Every day | ORAL | Status: DC | PRN
  Administered 2016-11-17: 5 mg via ORAL
  Filled 2016-11-15: qty 1

## 2016-11-15 MED ORDER — MORPHINE SULFATE (PF) 4 MG/ML IV SOLN
INTRAVENOUS | Status: AC
  Administered 2016-11-15: 4 mg via INTRAVENOUS
  Filled 2016-11-15: qty 1

## 2016-11-15 MED ORDER — SODIUM CHLORIDE 0.9 % IV SOLN
INTRAVENOUS | Status: AC
  Administered 2016-11-15: 1 mL via INTRAVENOUS

## 2016-11-15 MED ORDER — MORPHINE SULFATE (PF) 4 MG/ML IV SOLN
4.0000 mg | Freq: Once | INTRAVENOUS | Status: AC
  Administered 2016-11-15: 4 mg via INTRAVENOUS
  Filled 2016-11-15: qty 1

## 2016-11-15 MED ORDER — INSULIN ASPART 100 UNIT/ML ~~LOC~~ SOLN
0.0000 [IU] | SUBCUTANEOUS | Status: DC
  Administered 2016-11-15 – 2016-11-16 (×3): 1 [IU] via SUBCUTANEOUS
  Administered 2016-11-16: 2 [IU] via SUBCUTANEOUS
  Administered 2016-11-16 – 2016-11-17 (×3): 1 [IU] via SUBCUTANEOUS
  Administered 2016-11-17 (×2): 3 [IU] via SUBCUTANEOUS
  Administered 2016-11-18: 2 [IU] via SUBCUTANEOUS
  Administered 2016-11-18: 3 [IU] via SUBCUTANEOUS
  Administered 2016-11-18: 2 [IU] via SUBCUTANEOUS
  Administered 2016-11-18: 3 [IU] via SUBCUTANEOUS
  Administered 2016-11-18: 1 [IU] via SUBCUTANEOUS
  Administered 2016-11-18: 3 [IU] via SUBCUTANEOUS
  Administered 2016-11-19: 1 [IU] via SUBCUTANEOUS
  Administered 2016-11-19: 3 [IU] via SUBCUTANEOUS
  Administered 2016-11-19: 2 [IU] via SUBCUTANEOUS
  Administered 2016-11-19: 3 [IU] via SUBCUTANEOUS
  Administered 2016-11-19: 2 [IU] via SUBCUTANEOUS
  Administered 2016-11-19: 1 [IU] via SUBCUTANEOUS
  Administered 2016-11-19: 3 [IU] via SUBCUTANEOUS
  Administered 2016-11-20 (×2): 1 [IU] via SUBCUTANEOUS
  Administered 2016-11-20: 2 [IU] via SUBCUTANEOUS
  Administered 2016-11-20: 3 [IU] via SUBCUTANEOUS
  Administered 2016-11-20 – 2016-11-21 (×2): 2 [IU] via SUBCUTANEOUS
  Administered 2016-11-21: 1 [IU] via SUBCUTANEOUS
  Administered 2016-11-21: 2 [IU] via SUBCUTANEOUS
  Administered 2016-11-21 (×2): 1 [IU] via SUBCUTANEOUS
  Administered 2016-11-21 – 2016-11-22 (×2): 2 [IU] via SUBCUTANEOUS
  Administered 2016-11-22: 5 [IU] via SUBCUTANEOUS
  Administered 2016-11-22: 2 [IU] via SUBCUTANEOUS
  Administered 2016-11-22: 5 [IU] via SUBCUTANEOUS
  Administered 2016-11-22 – 2016-11-23 (×2): 3 [IU] via SUBCUTANEOUS
  Administered 2016-11-23: 1 [IU] via SUBCUTANEOUS
  Administered 2016-11-23: 5 [IU] via SUBCUTANEOUS
  Administered 2016-11-23: 3 [IU] via SUBCUTANEOUS
  Administered 2016-11-23: 1 [IU] via SUBCUTANEOUS
  Administered 2016-11-24: 2 [IU] via SUBCUTANEOUS
  Administered 2016-11-24: 3 [IU] via SUBCUTANEOUS
  Administered 2016-11-24 (×2): 1 [IU] via SUBCUTANEOUS

## 2016-11-15 MED ORDER — VITAMIN D3 50 MCG (2000 UT) PO TABS: 1.0000 | ORAL_TABLET | Freq: Every day | ORAL | Status: DC

## 2016-11-15 MED ORDER — INSULIN GLARGINE 100 UNIT/ML ~~LOC~~ SOLN
8.0000 [IU] | Freq: Every day | SUBCUTANEOUS | Status: DC
  Administered 2016-11-15 – 2016-11-23 (×9): 8 [IU] via SUBCUTANEOUS
  Filled 2016-11-15 (×10): qty 0.08

## 2016-11-15 MED ORDER — FENTANYL CITRATE (PF) 100 MCG/2ML IJ SOLN
25.0000 ug | INTRAMUSCULAR | Status: DC | PRN
  Administered 2016-11-15 – 2016-11-17 (×6): 50 ug via INTRAVENOUS
  Administered 2016-11-17: 100 ug via INTRAVENOUS
  Administered 2016-11-18: 25 ug via INTRAVENOUS
  Administered 2016-11-18: 50 ug via INTRAVENOUS
  Administered 2016-11-19 – 2016-11-20 (×2): 25 ug via INTRAVENOUS
  Filled 2016-11-15 (×8): qty 2

## 2016-11-15 MED ORDER — SODIUM CHLORIDE 0.9 % IV BOLUS (SEPSIS)
1000.0000 mL | Freq: Once | INTRAVENOUS | Status: AC
  Administered 2016-11-15: 1000 mL via INTRAVENOUS

## 2016-11-15 MED ORDER — ONDANSETRON HCL 4 MG/2ML IJ SOLN
INTRAMUSCULAR | Status: AC
  Administered 2016-11-15: 4 mg via INTRAVENOUS
  Filled 2016-11-15: qty 2

## 2016-11-15 MED ORDER — PIPERACILLIN-TAZOBACTAM 3.375 G IVPB
3.3750 g | Freq: Three times a day (TID) | INTRAVENOUS | Status: DC
  Administered 2016-11-16 – 2016-11-24 (×26): 3.375 g via INTRAVENOUS
  Filled 2016-11-15 (×27): qty 50

## 2016-11-15 MED ORDER — ACETAMINOPHEN 650 MG RE SUPP: 650.0000 mg | Freq: Four times a day (QID) | RECTAL | Status: DC | PRN

## 2016-11-15 MED ORDER — ENSURE ENLIVE PO LIQD
237.0000 mL | Freq: Two times a day (BID) | ORAL | Status: DC
  Administered 2016-11-16 – 2016-11-24 (×13): 237 mL via ORAL

## 2016-11-15 NOTE — ED Notes (Signed)
Patient transported to Ultrasound 

## 2016-11-15 NOTE — ED Notes (Signed)
Attempted to call report to [facility], nurse unavailable, gave call back number for nurse.

## 2016-11-15 NOTE — ED Provider Notes (Signed)
[facility] Emergency Department Provider Note  ____________________________________________  Time seen: Approximately 11:31 AM  I have reviewed the triage vital signs and the nursing notes.   HISTORY  Chief Complaint Abdominal Pain   HPI Henry Lindsey is a 83 y.o. male POD 2 from cystectomy who presents for evaluation of epigastric abdominal. Patient was discharged from the hospital yesterday and reports that he had just mild pain. Yesterday evening he started to have severe pain that he describes as sharp, located in the epigastric region, constant, severe, nonradiating. He has had 1 bowel movement this morning. He denies abdominal distention. He denies nausea or vomiting or fever. He does endorse chills. He denies chest pain, cough, shortness of breath. He denies dysuria.  Past Medical History:  Diagnosis Date  . BPH (benign prostatic hyperplasia)   . Cancer ([place])   . Carotid artery stenosis 11/2010   <50% bilaterally  . CVA (cerebral infarction) 11/2010   r thalamic lacunar  . Diabetes mellitus   . GERD (gastroesophageal reflux disease)   . Hyperlipidemia   . Hypertension   . Neuromuscular disorder ([place])   . Peripheral vascular disease in diabetes mellitus ([place]) 06/2012    95% occlusion s/p PTCA R SFA [place] Sept 2013    Patient Active Problem List   Diagnosis Date Noted  . Acute cholecystitis 11/13/2016  . Sepsis ([place]) 10/21/2016  . Chest pain 10/05/2016  . Cholecystitis, acute 10/05/2016  . Narcotic drug use 07/02/2016  . Chronic bronchitis with productive mucopurulent cough ([place]) 08/08/2015  . Pain in joint, ankle and foot 02/25/2015  . Diabetic neuropathy ([place]) 10/09/2014  . BPH with obstruction/lower urinary tract symptoms 10/09/2014  . Noncompliance with diet and medication regimen 07/09/2014  . Bursitis of hip, right 04/05/2014  . Glaucoma 04/05/2014  . Uncontrolled diabetes mellitus with diabetic nephropathy, with long-term current  use of insulin ([place]) 12/28/2013  . Peripheral autonomic neuropathy due to DM ([place]) 12/28/2013  . Overweight (BMI 25.0-29.9) 11/30/2013  . Vitamin D deficiency 11/03/2013  . Incomplete emptying of bladder 08/04/2013  . Benign localized hyperplasia of prostate with urinary obstruction and other lower urinary tract symptoms (LUTS)(600.21) 06/24/2013  . Nocturia 06/24/2013  . Orchalgia 06/24/2013  . Impotence due to erectile dysfunction 06/01/2013  . Routine general medical examination at a health care facility 04/04/2013  . CAD (coronary artery disease) 07/21/2012  . Peripheral vascular disease in diabetes mellitus ([place])   . Carotid artery stenosis with cerebral infarction ([place])   . PAD (peripheral artery disease) ([place]) 07/01/2012  . History of basal cell carcinoma excision 08/05/2011  . 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 Type 2 diabetes, uncontrolled, with peripheral circulatory disorder ([place]) 08/05/2011  . Hyperlipidemia   . Hypertension   . CVA (cerebral infarction)     Past Surgical History:  Procedure Laterality Date  . CHOLECYSTECTOMY N/A 11/13/2016   Procedure: LAPAROSCOPIC CHOLECYSTECTOMY WITH INTRAOPERATIVE CHOLANGIOGRAM;  Surgeon: Olean Ree, MD;  Location: [facility] ORS;  Service: General;  Laterality: N/A;  . IR GENERIC HISTORICAL  10/06/2016   IR PERC CHOLECYSTOSTOMY 10/06/2016 Aletta Edouard, MD [facility]-INTERV RAD  . JOINT REPLACEMENT Left 2014   left knee  . UPPER GASTROINTESTINAL ENDOSCOPY    . WRIST SURGERY      Prior to Admission medications   Medication Sig Start Date End Date Taking? Authorizing Provider  Alum & Mag Hydroxide-Simeth (MAGIC MOUTHWASH) SOLN Take 5 mLs by mouth 3 (three) times daily as needed for mouth pain.   Yes Historical  Provider, MD  aspirin 81 MG tablet Take 1 tablet (81 mg total) by mouth daily. 11/05/16  Yes Minna Merritts, MD  atorvastatin (LIPITOR) 40 MG tablet TAKE 1 TABLET BY MOUTH EVERY DAY 07/15/16  Yes [name], MD  Blood Glucose Monitoring  Suppl (ONE TOUCH ULTRA SYSTEM KIT) w/Device KIT 1 kit by Does not apply route once. Use DX code E11.59 10/18/15  Yes [name], MD  Cholecalciferol (VITAMIN D3) 2000 units TABS Take 1 tablet by mouth daily.   Yes Historical Provider, MD  cilostazol (PLETAL) 100 MG tablet TAKE 1 TABLET(100 MG) BY MOUTH TWICE DAILY 08/13/16  Yes [name], MD  famotidine (PEPCID) 20 MG tablet TAKE 1 TABLET(20 MG) BY MOUTH TWICE DAILY 05/08/16  Yes [name], MD  fenofibrate micronized (LOFIBRA) 134 MG capsule TAKE 1 CAPSULE BY MOUTH EVERY MORNING BEFORE BREAKFAST Patient taking differently: TAKE 1 CAPSULE tid 08/28/16  Yes [name], MD  gabapentin (NEURONTIN) 400 MG capsule TAKE 1 CAPSULE(400 MG) BY MOUTH THREE TIMES DAILY 09/09/16  Yes [name], MD  glipiZIDE (GLUCOTROL) 10 MG tablet TAKE 1 TABLET(10 MG) BY MOUTH TWICE DAILY 03/27/16  Yes [name], MD  HYDROcodone-acetaminophen (NORCO/VICODIN) 5-325 MG tablet Take 1-2 tablets by mouth every 6 (six) hours as needed for moderate pain. 11/13/16  Yes Olean Ree, MD  insulin lispro protamine-lispro (HUMALOG MIX 75/25) (75-25) 100 UNIT/ML SUSP injection Inject 10 units right before  breakfast and 6 units before dinner Patient taking differently: 6-8 Units 2 (two) times daily. Inject 6 qam and 8 qpm 05/03/16  Yes [name], MD  JANUVIA 100 MG tablet TAKE 1 TABLET(100 MG) BY MOUTH DAILY. NOTE DOSE INCREASE 08/13/16  Yes [name], MD  latanoprost (XALATAN) 0.005 % ophthalmic solution INSTILL 1 DROP INTO BOTH EYES QD 11/01/15  Yes Historical Provider, MD  metFORMIN (GLUCOPHAGE) 1000 MG tablet TAKE 1 TABLET BY MOUTH TWICE DAILY 07/26/16  Yes [name], MD  metoprolol succinate (TOPROL-XL) 100 MG 24 hr tablet Take 1 tablet (100 mg total) by mouth daily. Take with or immediately following a meal. 11/11/16  Yes Minna Merritts, MD  Multiple Vitamin (MULTIVITAMIN WITH MINERALS) TABS tablet Take 1 tablet by mouth daily.   Yes Historical  Provider, MD  nitroGLYCERIN (NITROSTAT) 0.4 MG SL tablet Place 1 tablet (0.4 mg total) under the tongue every 5 (five) minutes as needed for chest pain. MAXIMUM 3 TABLETS 10/04/16  Yes [name], MD  pantoprazole (PROTONIX) 40 MG tablet TAKE 1 TABLET(40 MG) BY MOUTH TWICE DAILY BEFORE A MEAL 11/08/16  Yes [name], MD  tamsulosin (FLOMAX) 0.4 MG CAPS capsule Take 0.4 mg by mouth daily after breakfast.   Yes Historical Provider, MD  traMADol (ULTRAM) 50 MG tablet Take 50 mg by mouth at bedtime.  11/24/13  Yes Historical Provider, MD  amLODipine (NORVASC) 10 MG tablet Take 1 tablet (10 mg total) by mouth daily. 11/11/16   Minna Merritts, MD  feeding supplement, ENSURE ENLIVE, (ENSURE ENLIVE) LIQD Take 237 mLs by mouth daily. Patient taking differently: Take 237 mLs by mouth 2 (two) times daily.  10/23/16 11/22/16  Nicholes Mango, MD  glucose blood test strip Use as instructed three times daily 05/03/16   [name], MD  INS SYRINGE/NEEDLE 1CC/28G (B-D INSULIN SYRINGE 1CC/28G) 28G X 1/2" 1 ML MISC USE TO ADMINISTER INSULIN DAILY 02/13/16   [name], MD  ondansetron (ZOFRAN) 4 MG  tablet Take 1 tablet (4 mg total) by mouth every 8 (eight) hours as needed for nausea or vomiting. Patient not taking: Reported on 11/11/2016 09/03/16   [name], MD  [facility] DELICA LANCETS 26V MISC Use three times daily to check blood sugar. 10/18/15   [name], MD    Allergies Patient has no known allergies.  Family History  Problem Relation Age of Onset  . Diabetes Mother     Social History Social History  Substance Use Topics  . Smoking status: Former Smoker    Packs/day: 0.50    Years: 33.00    Types: Cigarettes    Start date: 10-03-1935    Quit date: 04/21/1968  . Smokeless tobacco: Never Used  . Alcohol use No    Review of Systems  Constitutional: Negative for fever. + chills Eyes: Negative for visual changes. ENT: Negative for sore throat. Neck: No neck pain  Cardiovascular:  Negative for chest pain. Respiratory: Negative for shortness of breath. Gastrointestinal: + epigastric abdominal pain. No vomiting or diarrhea. Genitourinary: Negative for dysuria. Musculoskeletal: Negative for back pain. Skin: Negative for rash. Neurological: Negative for headaches, weakness or numbness. Psych: No SI or HI  ____________________________________________   PHYSICAL EXAM:  VITAL SIGNS: ED Triage Vitals  Enc Vitals Group     BP 11/15/16 0952 (!) 189/91     Pulse Rate 11/15/16 0952 76     Resp 11/15/16 0952 (!) 24     Temp 11/15/16 0952 97.4 F (36.3 C)     Temp Source 11/15/16 0952 Oral     SpO2 11/15/16 0952 96 %     Weight 11/15/16 0953 175 lb (79.4 kg)     Height 11/15/16 0953 5' 11" (1.803 m)     Head Circumference --      Peak Flow --      Pain Score 11/15/16 0953 10     Pain Loc --      Pain Edu? --      Excl. in [place]? --     Constitutional: Alert and oriented, in mild distress due to pain. HEENT:      Head: Normocephalic and atraumatic.         Eyes: Conjunctivae are normal. Sclera is non-icteric. EOMI. PERRL      Mouth/Throat: Mucous membranes are moist.       Neck: Supple with no signs of meningismus. Cardiovascular: Regular rate and rhythm. No murmurs, gallops, or rubs. 2+ symmetrical distal pulses are present in all extremities. No JVD. Respiratory: Normal respiratory effort. Lungs are clear to auscultation bilaterally. No wheezes, crackles, or rhonchi.  Gastrointestinal: Well healing laparotomy incision, significant ttp over the epigastric region, patient has tenderness in the epigastric region with percussion of the entire abdomen. + bowel sounds Musculoskeletal: Nontender with normal range of motion in all extremities. No edema, cyanosis, or erythema of extremities. Neurologic: Normal speech and language. Face is symmetric. Moving all extremities. No gross focal neurologic deficits are appreciated. Skin: Skin is warm, dry and intact. No rash  noted. Psychiatric: Mood and affect are normal. Speech and behavior are normal.  ____________________________________________   LABS (all labs ordered are listed, but only abnormal results are displayed)  Labs Reviewed  LIPASE, BLOOD - Abnormal; Notable for the following:       Result Value   Lipase 55 (*)    All other components within normal limits  COMPREHENSIVE METABOLIC PANEL - Abnormal; Notable for the following:    Chloride 97 (*)  Glucose, Bld 183 (*)    AST 162 (*)    ALT 247 (*)    Alkaline Phosphatase 341 (*)    Total Bilirubin 3.8 (*)    All other components within normal limits  CBC - Abnormal; Notable for the following:    WBC 14.4 (*)    Hemoglobin 12.7 (*)    HCT 38.3 (*)    RDW 15.0 (*)    All other components within normal limits  URINALYSIS, COMPLETE (UACMP) WITH MICROSCOPIC - Abnormal; Notable for the following:    Glucose, UA 500 (*)    Hgb urine dipstick MODERATE (*)    Bilirubin Urine SMALL (*)    Ketones, ur 15 (*)    Protein, ur 100 (*)    Squamous Epithelial / LPF 0-5 (*)    All other components within normal limits  TROPONIN I   ____________________________________________  EKG  ED ECG REPORT I, Rudene Re, the attending physician, personally viewed and interpreted this ECG.  Normal sinus rhythm, rate of 80, occasional PVCs, normal intervals, normal axis, no ST elevations or depressions. ____________________________________________  RADIOLOGY  RUQ [place]: Prior cholecystectomy. Dilated intrahepatic and extrahepatic biliary ducts. Common bile duct dilated to 12 mm. Cannot exclude distal obstructing duct stone. This could be further evaluated with ERCP or MRCP.  Fatty liver. ____________________________________________   PROCEDURES  Procedure(s) performed: None Procedures Critical Care performed:  None ____________________________________________   INITIAL IMPRESSION / ASSESSMENT AND PLAN / ED COURSE  83 y.o. male POD 2  from cholecystectomy who presents for evaluation of epigastric abdominal. Patient underwent cholecystectomy yesterday and reviewing the notes from Dr. Hampton Abbot, he was initial concerned that the patient might had a stone in the common bile duct however his tbili remained stable after the surgery and patient was discharged home. Patient started having abdominal pain yesterday and now has a T bili that is elevated at 3.8 with trending up alkaline phosphatase and stable mildly elevated LFTs and lipase. He also has a new leukocytosis with white count of 14.4 but otherwise does not meet sepsis criteria with no tachycardia or fever. His presentation is concerning for choledocholithiasis. We'll discuss with GI, start patient on IV morphine, IV Zofran, IV fluids, and IV Zosyn.  Clinical Course as of Nov 15 1401  Fri Nov 15, 2016  1402 Spoke with Dr. Ronalee Red, GI at [facility] who accepted patient for ERCP in the morning. Dr. Nehemiah Settle, hospitalist will admit patient.  [CV]    Clinical Course User Index [CV] Rudene Re, MD    Pertinent labs & imaging results that were available during my care of the patient were reviewed by me and considered in my medical decision making (see chart for details).    ____________________________________________   FINAL CLINICAL IMPRESSION(S) / ED DIAGNOSES  Final diagnoses:  Abdominal pain  Choledocholithiasis      NEW MEDICATIONS STARTED DURING THIS VISIT:  New Prescriptions   No medications on file     Note:  This document was prepared using Dragon voice recognition software and may include unintentional dictation errors.    Rudene Re, MD 11/15/16 [phone]

## 2016-11-15 NOTE — ED Notes (Signed)
Patient placed on 2L [place], oxygen sat 88-91%.  MD aware.

## 2016-11-15 NOTE — Progress Notes (Signed)
Pt transfer from [facility] at 6 pm complaining of abdominal pain, paged Dr. Jacinto Reap. Thompson waiting for new orders.

## 2016-11-15 NOTE — H&P (Signed)
History and Physical    Henry Lindsey UDJ:[id number] DOB: [birth year] DOA: 11/15/2016  PCP: [name], MD   Patient coming from: Home, by way of [facility]   Chief Complaint: Severe epigastric pain   HPI: Henry Lindsey is a 83 y.o. male with medical history significant for hypertension, non-insulin-dependent diabetes mellitus, coronary artery disease, peripheral arterial disease, GERD, and acute cholecystitis status post laparoscopic cholecystectomy on 11/13/2016, now presenting for evaluation of severe epigastric pain. Patient was discharged from [facility] yesterday after having undergone a laparoscopic cholecystectomy the day prior. He reports that his pain was minimal at time of discharge, but has steadily worsened since that time, now becoming severe. He shouldn't describes the pain as constant, localized to the epigastrium, sharp, worse with any movement or breathing, and with no alleviating factors identified. The patient has been taking Vicodin at home with no relief. He has not tried eating. He reports a normal bowel movement earlier this morning. Patient denies any fevers, chest pain, palpitations, or cough. There has been no vomiting, diarrhea, melena, or hematochezia. Patient began having right upper quadrant pain last November and developed sepsis secondary to acute cholecystitis just over 1 month ago, treated initially with empiric antibiotics and percutaneous biliary drain. After obtaining cardiac clearance, the patient underwent a laparoscopic cholecystectomy on 11/13/2016 with concern for possible CBD obstruction noted on intraoperative cholangiogram. Patient was observed in the hospital overnight and his bilirubin was normal the following morning, and so he was discharged home.     ED Course: Upon arrival to the [facility] ED, patient is found to be afebrile, saturating adequately on room air, mildly tachycardic, and with vitals otherwise stable. EKG features a  sinus rhythm with PVCs and LVH by voltage criteria. Chemistry panel is notable for a total bilirubin of 3.8, up from 0.6 yesterday. Transaminases are also elevated to the 200 range. CBC is notable for a new leukocytosis to 14,400 and a stable normocytic anemia with hemoglobin 12.7. Urinalysis is unremarkable. Right upper quadrant ultrasound was performed in the ED and demonstrates dilation of intrahepatic and extrahepatic biliary ducts with CBD dilated to 12 mm. Fatty liver was also noted on the ultrasound and distal obstructing stones in the CBD cannot be excluded. Gastroenterology was consulted by the ED physician and advised admission to [facility] for possible ERCP in the morning. A medical admission was advised. Patient has remained hemodynamically stable and in no apparent respiratory distress and will be admitted to medical/surgical unit for ongoing evaluation and management of severe abdominal pain, likely secondary to biliary obstruction.  Review of Systems:  All other systems reviewed and apart from HPI, are negative.  Past Medical History:  Diagnosis Date  . BPH (benign prostatic hyperplasia)   . Cancer ([place])   . Carotid artery stenosis 11/2010   <50% bilaterally  . CVA (cerebral infarction) 11/2010   r thalamic lacunar  . Diabetes mellitus   . GERD (gastroesophageal reflux disease)   . Hyperlipidemia   . Hypertension   . Neuromuscular disorder ([place])   . Peripheral vascular disease in diabetes mellitus ([place]) 06/2012    95% occlusion s/p PTCA R SFA Dew Sept 2013    Past Surgical History:  Procedure Laterality Date  . CHOLECYSTECTOMY N/A 11/13/2016   Procedure: LAPAROSCOPIC CHOLECYSTECTOMY WITH INTRAOPERATIVE CHOLANGIOGRAM;  Surgeon: Olean Ree, MD;  Location: [facility] ORS;  Service: General;  Laterality: N/A;  . IR GENERIC HISTORICAL  10/06/2016   IR PERC CHOLECYSTOSTOMY 10/06/2016 Aletta Edouard, MD  [facility]-INTERV RAD  . JOINT REPLACEMENT Left 2014   left knee  . UPPER  GASTROINTESTINAL ENDOSCOPY    . WRIST SURGERY       reports that he quit smoking about 48 years ago. His smoking use included Cigarettes. He started smoking about 81 years ago. He has a 16.50 pack-year smoking history. He has never used smokeless tobacco. He reports that he does not drink alcohol or use drugs.  No Known Allergies  Family History  Problem Relation Age of Onset  . Diabetes Mother      Prior to Admission medications   Medication Sig Start Date End Date Taking? Authorizing Provider  Alum & Mag Hydroxide-Simeth (MAGIC MOUTHWASH) SOLN Take 5 mLs by mouth 3 (three) times daily as needed for mouth pain.    Historical Provider, MD  amLODipine (NORVASC) 10 MG tablet Take 1 tablet (10 mg total) by mouth daily. 11/11/16   Minna Merritts, MD  aspirin 81 MG tablet Take 1 tablet (81 mg total) by mouth daily. 11/05/16   Minna Merritts, MD  atorvastatin (LIPITOR) 40 MG tablet TAKE 1 TABLET BY MOUTH EVERY DAY 07/15/16   [name], MD  Blood Glucose Monitoring Suppl (ONE TOUCH ULTRA SYSTEM KIT) w/Device KIT 1 kit by Does not apply route once. Use DX code E11.59 10/18/15   [name], MD  Cholecalciferol (VITAMIN D3) 2000 units TABS Take 1 tablet by mouth daily.    Historical Provider, MD  cilostazol (PLETAL) 100 MG tablet TAKE 1 TABLET(100 MG) BY MOUTH TWICE DAILY 08/13/16   [name], MD  famotidine (PEPCID) 20 MG tablet TAKE 1 TABLET(20 MG) BY MOUTH TWICE DAILY 05/08/16   [name], MD  feeding supplement, ENSURE ENLIVE, (ENSURE ENLIVE) LIQD Take 237 mLs by mouth daily. Patient taking differently: Take 237 mLs by mouth 2 (two) times daily.  10/23/16 11/22/16  Nicholes Mango, MD  fenofibrate micronized (LOFIBRA) 134 MG capsule TAKE 1 CAPSULE BY MOUTH EVERY MORNING BEFORE BREAKFAST Patient taking differently: TAKE 1 CAPSULE tid 08/28/16   [name], MD  gabapentin (NEURONTIN) 400 MG capsule TAKE 1 CAPSULE(400 MG) BY MOUTH THREE TIMES DAILY 09/09/16   [name], MD    glipiZIDE (GLUCOTROL) 10 MG tablet TAKE 1 TABLET(10 MG) BY MOUTH TWICE DAILY 03/27/16   [name], MD  glucose blood test strip Use as instructed three times daily 05/03/16   [name], MD  HYDROcodone-acetaminophen (NORCO/VICODIN) 5-325 MG tablet Take 1-2 tablets by mouth every 6 (six) hours as needed for moderate pain. 11/13/16   Olean Ree, MD  INS SYRINGE/NEEDLE 1CC/28G (B-D INSULIN SYRINGE 1CC/28G) 28G X 1/2" 1 ML MISC USE TO ADMINISTER INSULIN DAILY 02/13/16   [name], MD  insulin lispro protamine-lispro (HUMALOG MIX 75/25) (75-25) 100 UNIT/ML SUSP injection Inject 10 units right before  breakfast and 6 units before dinner Patient taking differently: 6-8 Units 2 (two) times daily. Inject 6 qam and 8 qpm 05/03/16   [name], MD  JANUVIA 100 MG tablet TAKE 1 TABLET(100 MG) BY MOUTH DAILY. NOTE DOSE INCREASE 08/13/16   [name], MD  latanoprost (XALATAN) 0.005 % ophthalmic solution INSTILL 1 DROP INTO BOTH EYES QD 11/01/15   Historical Provider, MD  metFORMIN (GLUCOPHAGE) 1000 MG tablet TAKE 1 TABLET BY MOUTH TWICE DAILY 07/26/16   [name], MD  metoprolol succinate (TOPROL-XL) 100 MG 24 hr tablet Take 1 tablet (100 mg total) by mouth daily. Take  with or immediately following a meal. 11/11/16   Minna Merritts, MD  Multiple Vitamin (MULTIVITAMIN WITH MINERALS) TABS tablet Take 1 tablet by mouth daily.    Historical Provider, MD  nitroGLYCERIN (NITROSTAT) 0.4 MG SL tablet Place 1 tablet (0.4 mg total) under the tongue every 5 (five) minutes as needed for chest pain. MAXIMUM 3 TABLETS 10/04/16   [name], MD  ondansetron (ZOFRAN) 4 MG tablet Take 1 tablet (4 mg total) by mouth every 8 (eight) hours as needed for nausea or vomiting. Patient not taking: Reported on 11/11/2016 09/03/16   [name], MD  [facility] DELICA LANCETS 62I MISC Use three times daily to check blood sugar. 10/18/15   [name], MD  pantoprazole (PROTONIX) 40 MG tablet TAKE 1 TABLET(40  MG) BY MOUTH TWICE DAILY BEFORE A MEAL 11/08/16   [name], MD  tamsulosin (FLOMAX) 0.4 MG CAPS capsule Take 0.4 mg by mouth daily after breakfast.    Historical Provider, MD  traMADol (ULTRAM) 50 MG tablet Take 50 mg by mouth at bedtime.  11/24/13   Historical Provider, MD    Physical Exam: Vitals:   11/15/16 1809  BP: (!) 155/82  Pulse: (!) 115  Temp: 98.7 F (37.1 C)  TempSrc: Oral  SpO2: 93%      Constitutional: No respiratory distress, in obvious discomfort Eyes: PERTLA, lids and conjunctivae normal ENMT: Mucous membranes are dry. Posterior pharynx clear of any exudate or lesions.   Neck: normal, supple, no masses, no thyromegaly Respiratory: clear to auscultation bilaterally, no wheezing, no crackles. Normal respiratory effort.   Cardiovascular: S1 & S2 heard, regular rate and rhythm. No significant JVD. Abdomen: Mild distension. Exquisite tenderness in epigastrium and RUQ. Surgical incision sites intact with surrounding ecchymosis, but no erythema or drainage. Bowel sounds active.  Musculoskeletal: no clubbing / cyanosis. No joint deformity upper and lower extremities. Normal muscle tone.  Skin: no significant rashes, lesions, ulcers. Warm, dry, well-perfused. Neurologic: CN 2-12 grossly intact. Sensation intact, DTR normal. Strength 5/5 in all 4 limbs.  Psychiatric: Normal judgment and insight. Alert and oriented x 3. Normal mood and affect.     Labs on Admission: I have personally reviewed following labs and imaging studies  CBC:  Recent Labs Lab 11/15/16 0958  WBC 14.4*  HGB 12.7*  HCT 38.3*  MCV 82.8  PLT 297   Basic Metabolic Panel:  Recent Labs Lab 11/11/16 1142 11/13/16 1806 11/14/16 0455 11/15/16 0958  NA  --  137 139 136  K 3.7 3.7 4.0 3.7  CL  --  102 103 97*  CO2  --  '28 30 28  ' GLUCOSE  --  236* 195* 183*  BUN  --  '15 12 8  ' CREATININE  --  0.99 0.91 0.68  CALCIUM  --  8.0* 8.2* 9.2   GFR: Estimated Creatinine Clearance: 77.1 mL/min  (by C-G formula based on SCr of 0.68 mg/dL). Liver Function Tests:  Recent Labs Lab 11/13/16 1806 11/14/16 0455 11/15/16 0958  AST 296* 179* 162*  ALT 276* 212* 247*  ALKPHOS 277* 231* 341*  BILITOT 0.8 0.6 3.8*  PROT 6.2* 5.6* 7.6  ALBUMIN 3.5 3.0* 3.8    Recent Labs Lab 11/15/16 0958  LIPASE 55*   No results for input(s): AMMONIA in the last 168 hours. Coagulation Profile: No results for input(s): INR, PROTIME in the last 168 hours. Cardiac Enzymes:  Recent Labs Lab 11/15/16 0958  TROPONINI <0.03   BNP (last 3 results)  No results for input(s): PROBNP in the last 8760 hours. HbA1C: No results for input(s): HGBA1C in the last 72 hours. CBG:  Recent Labs Lab 11/13/16 1107 11/13/16 1448 11/14/16 0759  GLUCAP 135* 195* 177*   Lipid Profile: No results for input(s): CHOL, HDL, LDLCALC, TRIG, CHOLHDL, LDLDIRECT in the last 72 hours. Thyroid Function Tests: No results for input(s): TSH, T4TOTAL, FREET4, T3FREE, THYROIDAB in the last 72 hours. Anemia Panel: No results for input(s): VITAMINB12, FOLATE, FERRITIN, TIBC, IRON, RETICCTPCT in the last 72 hours. Urine analysis:    Component Value Date/Time   COLORURINE YELLOW 11/15/2016 0958   APPEARANCEUR CLEAR 11/15/2016 0958   APPEARANCEUR Clear 08/17/2012 1505   LABSPEC 1.020 11/15/2016 0958   LABSPEC 1.017 08/17/2012 1505   PHURINE 7.0 11/15/2016 0958   GLUCOSEU 500 (A) 11/15/2016 0958   GLUCOSEU 50 mg/dL 08/17/2012 1505   HGBUR MODERATE (A) 11/15/2016 0958   BILIRUBINUR SMALL (A) 11/15/2016 0958   BILIRUBINUR neg 10/07/2014 1444   BILIRUBINUR Negative 08/17/2012 1505   KETONESUR 15 (A) 11/15/2016 0958   PROTEINUR 100 (A) 11/15/2016 0958   UROBILINOGEN 0.2 10/07/2014 1444   NITRITE NEGATIVE 11/15/2016 0958   LEUKOCYTESUR NEGATIVE 11/15/2016 0958   LEUKOCYTESUR Negative 08/17/2012 1505   Sepsis Labs: '@LABRCNTIP' (procalcitonin:4,lacticidven:4) )No results found for this or any previous visit (from the  past 240 hour(s)).   Radiological Exams on Admission: US Abdomen Limited Ruq  Result Date: 11/15/2016 CLINICAL DATA:  Abdominal pain EXAM: US ABDOMEN LIMITED - RIGHT UPPER QUADRANT COMPARISON:  CT 1 month 1,016 FINDINGS: Gallbladder: Prior cholecystectomy Common bile duct: Diameter: Dilated, 12 mm. Dilated duct as seen on intraoperative cholangiogram. History new since prior CT. Liver: Intrahepatic biliary ductal dilatation. Increased echotexture throughout the liver. IMPRESSION: Prior cholecystectomy. Dilated intrahepatic and extrahepatic biliary ducts. Common bile duct dilated to 12 mm. Cannot exclude distal obstructing duct stone. This could be further evaluated with ERCP or MRCP. Fatty liver. Electronically Signed   By: Rolm Baptise M.D.   On: 11/15/2016 12:23    EKG: Independently reviewed. Sinus rhythm, PVC's, LVH   Assessment/Plan  1. Biliary obstruction  - Sent from [place] with severe upper abd pain and elevated bilirubin after lap chole 11/13/16 with CBD obstruction suggested by intraoperative cholangiogram  - He was managed for sepsis secondary to acute cholecystitis with percutaneous drain 1 month ago, then underwent the cholecystectomy on 11/13/16 at [facility]  - Given cholangiogram findings suggestive of CBD obstruction, pt was observed overnight and d/c'd following day as pain was controlled and bilirubin remained normal  - Unfortunately, he has developed severe pain at home and is noted to have newly elevated total bili to 3.8 (had been 0.6 on 11/14/16)   - The GI consultant at [facility] does not perform ERCP and GI at [place] accepted patient in consultation  - Will make pt NPO, provide gentle IVF hydration, pain-control, prn anti-emetics, empiric Zosyn given new leukocytosis and recent instrumentation, follow-up GI recommendations    2. CAD, PAD  - No anginal complaints on admission and no ischemic features noted on EKG  - Continue Lipitor and Toprol as tolerated;  hold ASA and cilostazol for likely endoscopy    3. Insulin-dependent DM  - A1c was 7.6% in December 2017  - Managed at home with metformin, Januvia, glipizide, and Humalog 75/25 6 units qAM and 8 units qPM; these are held on admission  - Check CBG q4h  - Start Lantus 8 units qHS with a low-intensity sliding-scale correctional  4. Hypertension  - BP at goal on admission  - Continue Toprol as tolerated    DVT prophylaxis: sq Lovenox  Code Status: Full  Family Communication: Daughter updated at bedside with patient's permission Disposition Plan: Admit to med-surg Consults called: Gastroenterology Admission status: Inpatient    Vianne Bulls, MD [facility] Pager [phone]  If 7PM-7AM, please contact night-coverage [email or web address] Password [facility]  11/15/2016, 9:35 PM

## 2016-11-15 NOTE — ED Triage Notes (Signed)
Pt arrived via EMS from home for reports of increased abdominal pain and distention. Pt had lap cholycystectomy Wednesday this week. Pt took Norco and pain is unrelieved. EMS reports 187/92, 80 HR.

## 2016-11-15 NOTE — Progress Notes (Addendum)
Pharmacy Antibiotic Note  Henry Lindsey is a 83 y.o. male admitted on 11/15/2016 with intra-abdominal infection.  Pharmacy has been consulted for Zosyn dosing. Afebrile, WBC 14.4, CrCl~77.  Plan: Zosyn 3.375g IV (40min inf) x1; then 3.375g IV q8h (4h inf) Monitor clinical progress, c/s, renal function, abx plan/LOT      Temp (24hrs), Avg:98.3 F (36.8 C), Min:97.4 F (36.3 C), Max:98.8 F (37.1 C)   Recent Labs Lab 11/13/16 1806 11/14/16 0455 11/15/16 0958  WBC  --   --  14.4*  CREATININE 0.99 0.91 0.68    Estimated Creatinine Clearance: 77.1 mL/min (by C-G formula based on SCr of 0.68 mg/dL).    No Known Allergies   Elicia Lamp, PharmD, BCPS Clinical Pharmacist 11/15/2016 9:48 PM   ========================  Addendum: - Pharmacy will sign off as dosage adjustment is likely unnecessary.  Thank you for the consult!   Kweli Grassel D. Mina Marble, PharmD, BCPS Pager:  [phone] 11/16/2016, 9:28 AM

## 2016-11-15 NOTE — Telephone Encounter (Signed)
Patient is 2 days post-op from Laparoscopic Cholecystectomy with Dr. Dahlia Byes. Called in with complaints of "Excrutiating Chest Pain." Patient sounded to be in distress on the phone. I told patient to call 911 immediately for assistance.  Surgeon on call notified.

## 2016-11-16 ENCOUNTER — Inpatient Hospital Stay (HOSPITAL_COMMUNITY): Payer: Medicare Other

## 2016-11-16 DIAGNOSIS — R1084 Generalized abdominal pain: Secondary | ICD-10-CM

## 2016-11-16 DIAGNOSIS — E1151 Type 2 diabetes mellitus with diabetic peripheral angiopathy without gangrene: Secondary | ICD-10-CM

## 2016-11-16 DIAGNOSIS — R7989 Other specified abnormal findings of blood chemistry: Secondary | ICD-10-CM

## 2016-11-16 DIAGNOSIS — K831 Obstruction of bile duct: Secondary | ICD-10-CM

## 2016-11-16 LAB — COMPREHENSIVE METABOLIC PANEL
ALBUMIN: 2.2 g/dL — AB (ref 3.5–5.0)
ALT: 115 U/L — ABNORMAL HIGH (ref 17–63)
ANION GAP: 9 (ref 5–15)
AST: 37 U/L (ref 15–41)
Alkaline Phosphatase: 197 U/L — ABNORMAL HIGH (ref 38–126)
BILIRUBIN TOTAL: 2 mg/dL — AB (ref 0.3–1.2)
BUN: 9 mg/dL (ref 6–20)
CO2: 26 mmol/L (ref 22–32)
Calcium: 8.5 mg/dL — ABNORMAL LOW (ref 8.9–10.3)
Chloride: 104 mmol/L (ref 101–111)
Creatinine, Ser: 0.89 mg/dL (ref 0.61–1.24)
GLUCOSE: 133 mg/dL — AB (ref 65–99)
POTASSIUM: 3.7 mmol/L (ref 3.5–5.1)
Sodium: 139 mmol/L (ref 135–145)
TOTAL PROTEIN: 5.5 g/dL — AB (ref 6.5–8.1)

## 2016-11-16 LAB — CBC
HCT: 36.3 % — ABNORMAL LOW (ref 39.0–52.0)
Hemoglobin: 11.2 g/dL — ABNORMAL LOW (ref 13.0–17.0)
MCH: 26.7 pg (ref 26.0–34.0)
MCHC: 30.9 g/dL (ref 30.0–36.0)
MCV: 86.4 fL (ref 78.0–100.0)
PLATELETS: 246 10*3/uL (ref 150–400)
RBC: 4.2 MIL/uL — AB (ref 4.22–5.81)
RDW: 14.7 % (ref 11.5–15.5)
WBC: 12 10*3/uL — AB (ref 4.0–10.5)

## 2016-11-16 LAB — GLUCOSE, CAPILLARY
GLUCOSE-CAPILLARY: 132 mg/dL — AB (ref 65–99)
GLUCOSE-CAPILLARY: 126 mg/dL — AB (ref 65–99)
Glucose-Capillary: 103 mg/dL — ABNORMAL HIGH (ref 65–99)
Glucose-Capillary: 113 mg/dL — ABNORMAL HIGH (ref 65–99)
Glucose-Capillary: 134 mg/dL — ABNORMAL HIGH (ref 65–99)
Glucose-Capillary: 156 mg/dL — ABNORMAL HIGH (ref 65–99)

## 2016-11-16 LAB — PROTIME-INR
INR: 1.37
PROTHROMBIN TIME: 17 s — AB (ref 11.4–15.2)

## 2016-11-16 MED ORDER — IOPAMIDOL (ISOVUE-300) INJECTION 61%
INTRAVENOUS | Status: AC
  Administered 2016-11-16: 100 mL
  Filled 2016-11-16: qty 100

## 2016-11-16 MED ORDER — LACTATED RINGERS IV SOLN
INTRAVENOUS | Status: DC
  Administered 2016-11-16: 15:00:00 via INTRAVENOUS
  Administered 2016-11-17: 1 mL via INTRAVENOUS
  Administered 2016-11-18 – 2016-11-20 (×3): via INTRAVENOUS

## 2016-11-16 MED ORDER — IOPAMIDOL (ISOVUE-300) INJECTION 61%
INTRAVENOUS | Status: AC
  Administered 2016-11-16: 14:00:00
  Filled 2016-11-16: qty 30

## 2016-11-16 MED ORDER — TECHNETIUM TC 99M MEBROFENIN IV KIT
5.1500 | PACK | Freq: Once | INTRAVENOUS | Status: AC | PRN
  Administered 2016-11-16: 5.15 via INTRAVENOUS

## 2016-11-16 MED ORDER — ENOXAPARIN SODIUM 40 MG/0.4ML ~~LOC~~ SOLN
40.0000 mg | Freq: Every day | SUBCUTANEOUS | Status: DC
  Administered 2016-11-17 – 2016-11-20 (×4): 40 mg via SUBCUTANEOUS
  Filled 2016-11-16 (×4): qty 0.4

## 2016-11-16 NOTE — Consult Note (Addendum)
[facility] Consult: 12:10 PM 11/16/2016  LOS: 1 day    Referring Provider: Dr Desmond Lope  Primary Care Physician:  [name], MD Primary Gastroenterologist:  Althia Forts.      Reason for Consultation:  Question bile duct obstruction.   HPI: Henry Lindsey is a 83 y.o. male.  PMH HTN. NIDDM. CAD. Peripheral arterial disease. Carotid artery stenosis.  History CVA.  Emphysema.  Remote colonoscopy, he dos  Patient status post percutaneous cholecystostomy tube 10/06/16 for acute cholecystitis. CT scan prior to drain placement showed probable acute cholecystitis with distended and inflamed gallbladder, gallbladder stones.  They suspected a 4-5 mm CBD stone and raised the question of him having other nonvisible ductal stones. 10/07/16 MRCP revealed decompression of the inflamed gallbladder. No choledocholithiasis. Hepatic steatosis.  Patient improved and the plan was for laparoscopic cholecystectomy at some point in the future.  There was no plan for GI follow-up, though he was followed by Dr. Owens Loffler during the hospitalization.  Patient return to the emergency department 10/21/16 with acute right upper quadrant pain.  LFTs were normal, WBCs 16.7.  Plan was to continue IV antibiotics. MiraLAX was added because there was significant stool burden seen on the CT scan. Patient was to follow-up with the surgeon. CT abdomen pelvis with contrast on 10/21/16 showed the catheter present in the gallbladder fossa. Decompressed gallbladder some mild residual edema and stranding in the right upper quadrant suggestive of mild residual inflammatory changes. Previously noted intrahepatic biliary dilatation had resolved.  Diffuse fatty infiltration of liver. Some calcifications in the region of the common bile duct at the head of  the pancreas. Sigmoid diverticulosis.   Status post laparoscopic cholecystectomy 11/13/16 by Dr Hampton Abbot. Surgeon's interpretation of IOC was that there were no filling defects and contrast was trickling into the intestine. However the radiologists report states "showed dilation of biliary system without significant drainage into the duodenum concerning for obstruction of the distal common bile duct" .  LFTs that day showed alkaline phosphatase 277, AST/ALT 296/276. Total bilirubin normal.  As of the question of CBD stones, patient was observed overnight and LFTs repeated the next day showed alkaline phosphatase 231, AST/ALT 179/212, normal bilirubin.  He was discharged that day and was tolerating diet with no nausea or vomiting, pain was well controlled.. Presented  to [facility]/15/16 with worsening abdominal pain.  This pain is confined to the epigastrium, as opposed to previous pain which was in the right upper quadrant. He denies nausea and vomiting.. The alkaline phosphatase 341, AST/ALT 162/247. T bili 3.8. Abdominal ultrasound shows the prior cholecystectomy. Both intra-and extrahepatic bile ducts are dilated with a 12 mm CBD. Though not seen, unable to exclude a distal obstructing bile duct stone. Fatty liver also noted.  Dilaudid is effective in controlling the pain but pain recurs once the meds wear off. No nausea or vomiting. Historically does not suffer from reflux symptoms.    Past Medical History:  Diagnosis Date  . BPH (benign prostatic hyperplasia)   . Cancer ([place])   .  Carotid artery stenosis 11/2010   <50% bilaterally  . CVA (cerebral infarction) 11/2010   r thalamic lacunar  . Diabetes mellitus   . GERD (gastroesophageal reflux disease)   . Hyperlipidemia   . Hypertension   . Neuromuscular disorder ([place])   . Peripheral vascular disease in diabetes mellitus ([place]) 06/2012    95% occlusion s/p PTCA R SFA Dew Sept 2013    Past Surgical History:    Procedure Laterality Date  . CHOLECYSTECTOMY N/A 11/13/2016   Procedure: LAPAROSCOPIC CHOLECYSTECTOMY WITH INTRAOPERATIVE CHOLANGIOGRAM;  Surgeon: Olean Ree, MD;  Location: [facility] ORS;  Service: General;  Laterality: N/A;  . IR GENERIC HISTORICAL  10/06/2016   IR PERC CHOLECYSTOSTOMY 10/06/2016 Aletta Edouard, MD [facility]-INTERV RAD  . JOINT REPLACEMENT Left 2014   left knee  . UPPER GASTROINTESTINAL ENDOSCOPY    . WRIST SURGERY      Prior to Admission medications   Medication Sig Start Date End Date Taking? Authorizing Provider  Alum & Mag Hydroxide-Simeth (MAGIC MOUTHWASH) SOLN Take 5 mLs by mouth 3 (three) times daily as needed for mouth pain.    Historical Provider, MD  amLODipine (NORVASC) 10 MG tablet Take 1 tablet (10 mg total) by mouth daily. 11/11/16   Minna Merritts, MD  aspirin 81 MG tablet Take 1 tablet (81 mg total) by mouth daily. 11/05/16   Minna Merritts, MD  atorvastatin (LIPITOR) 40 MG tablet TAKE 1 TABLET BY MOUTH EVERY DAY 07/15/16   [name], MD  Blood Glucose Monitoring Suppl (ONE TOUCH ULTRA SYSTEM KIT) w/Device KIT 1 kit by Does not apply route once. Use DX code E11.59 10/18/15   [name], MD  Cholecalciferol (VITAMIN D3) 2000 units TABS Take 1 tablet by mouth daily.    Historical Provider, MD  cilostazol (PLETAL) 100 MG tablet TAKE 1 TABLET(100 MG) BY MOUTH TWICE DAILY 08/13/16   [name], MD  famotidine (PEPCID) 20 MG tablet TAKE 1 TABLET(20 MG) BY MOUTH TWICE DAILY 05/08/16   [name], MD  feeding supplement, ENSURE ENLIVE, (ENSURE ENLIVE) LIQD Take 237 mLs by mouth daily. Patient taking differently: Take 237 mLs by mouth 2 (two) times daily.  10/23/16 11/22/16  Nicholes Mango, MD  fenofibrate micronized (LOFIBRA) 134 MG capsule TAKE 1 CAPSULE BY MOUTH EVERY MORNING BEFORE BREAKFAST Patient taking differently: TAKE 1 CAPSULE tid 08/28/16   [name], MD  gabapentin (NEURONTIN) 400 MG capsule TAKE 1 CAPSULE(400 MG) BY MOUTH THREE TIMES DAILY  09/09/16   [name], MD  glipiZIDE (GLUCOTROL) 10 MG tablet TAKE 1 TABLET(10 MG) BY MOUTH TWICE DAILY 03/27/16   [name], MD  glucose blood test strip Use as instructed three times daily 05/03/16   [name], MD  HYDROcodone-acetaminophen (NORCO/VICODIN) 5-325 MG tablet Take 1-2 tablets by mouth every 6 (six) hours as needed for moderate pain. 11/13/16   Olean Ree, MD  INS SYRINGE/NEEDLE 1CC/28G (B-D INSULIN SYRINGE 1CC/28G) 28G X 1/2" 1 ML MISC USE TO ADMINISTER INSULIN DAILY 02/13/16   [name], MD  insulin lispro protamine-lispro (HUMALOG MIX 75/25) (75-25) 100 UNIT/ML SUSP injection Inject 10 units right before  breakfast and 6 units before dinner Patient taking differently: 6-8 Units 2 (two) times daily. Inject 6 qam and 8 qpm 05/03/16   [name], MD  JANUVIA 100 MG tablet TAKE 1 TABLET(100 MG) BY MOUTH DAILY. NOTE DOSE INCREASE 08/13/16   [name], MD  latanoprost (XALATAN) 0.005 % ophthalmic  solution INSTILL 1 DROP INTO BOTH EYES QD 11/01/15   Historical Provider, MD  metFORMIN (GLUCOPHAGE) 1000 MG tablet TAKE 1 TABLET BY MOUTH TWICE DAILY 07/26/16   [name], MD  metoprolol succinate (TOPROL-XL) 100 MG 24 hr tablet Take 1 tablet (100 mg total) by mouth daily. Take with or immediately following a meal. 11/11/16   Minna Merritts, MD  Multiple Vitamin (MULTIVITAMIN WITH MINERALS) TABS tablet Take 1 tablet by mouth daily.    Historical Provider, MD  nitroGLYCERIN (NITROSTAT) 0.4 MG SL tablet Place 1 tablet (0.4 mg total) under the tongue every 5 (five) minutes as needed for chest pain. MAXIMUM 3 TABLETS 10/04/16   [name], MD  ondansetron (ZOFRAN) 4 MG tablet Take 1 tablet (4 mg total) by mouth every 8 (eight) hours as needed for nausea or vomiting. Patient not taking: Reported on 11/11/2016 09/03/16   [name], MD  [facility] DELICA LANCETS 09N MISC Use three times daily to check blood sugar. 10/18/15   [name], MD  pantoprazole (PROTONIX)  40 MG tablet TAKE 1 TABLET(40 MG) BY MOUTH TWICE DAILY BEFORE A MEAL 11/08/16   [name], MD  tamsulosin (FLOMAX) 0.4 MG CAPS capsule Take 0.4 mg by mouth daily after breakfast.    Historical Provider, MD  traMADol (ULTRAM) 50 MG tablet Take 50 mg by mouth at bedtime.  11/24/13   Historical Provider, MD    Scheduled Meds: . atorvastatin  40 mg Oral q1800  . cholecalciferol  2,000 Units Oral Daily  . enoxaparin (LOVENOX) injection  40 mg Subcutaneous QHS  . famotidine (PEPCID) IV  20 mg Intravenous Q12H  . feeding supplement (ENSURE ENLIVE)  237 mL Oral BID  . gabapentin  400 mg Oral TID  . insulin aspart  0-9 Units Subcutaneous Q4H  . insulin glargine  8 Units Subcutaneous QHS  . latanoprost  1 drop Both Eyes QHS  . metoprolol succinate  100 mg Oral Daily  . multivitamin with minerals  1 tablet Oral Daily  . pantoprazole  40 mg Oral BID  . piperacillin-tazobactam (ZOSYN)  IV  3.375 g Intravenous Q8H  . senna  1 tablet Oral BID  . tamsulosin  0.4 mg Oral QPC breakfast   Infusions:  PRN Meds: acetaminophen **OR** acetaminophen, bisacodyl, fentaNYL (SUBLIMAZE) injection, HYDROmorphone (DILAUDID) injection, magic mouthwash, ondansetron (ZOFRAN) IV, polyethylene glycol   Allergies as of 11/15/2016  . (No Known Allergies)    Family History  Problem Relation Age of Onset  . Diabetes Mother     Social History   Social History  . Marital status: Widowed    Spouse name: Henry Lindsey, deceased  . Number of children: 2  . Years of education: 12   Occupational History  .  Retired   Social History Main Topics  . Smoking status: Former Smoker    Packs/day: 0.50    Years: 33.00    Types: Cigarettes    Start date: [name] 06, 1935    Quit date: 04/21/1968  . Smokeless tobacco: Never Used  . Alcohol use No  . Drug use: No  . Sexual activity: Yes    Partners: Female    Birth control/ protection: None   Other Topics Concern  . Not on file   Social History Narrative   Widowed in  2014; dating again     REVIEW OF SYSTEMS: Constitutional:  With all of what's been going on with him he generally feels weak. ENT:  No nose bleeds Pulm:  No shortness of breath, no cough. CV:  No palpitations, no LE edema.  GU:  No hematuria.  Long-standing history of urinary frequency. GI:  Generally does not suffer from reflux symptoms. No recent blood per rectum. Heme:  No unusual bleeding or bruising.   Transfusions:  None recently Neuro:  No headaches, no peripheral tingling or numbness Derm:  No itching, no rash or sores.  Endocrine:  No sweats or chills.  No polyuria or dysuria Immunization:  Current on flu shot, Pneumovax and tetanus diphtheria. Travel:  None beyond local counties in last few months.    PHYSICAL EXAM: Vital signs in last 24 hours: Vitals:   11/15/16 2300 11/16/16 0532  BP: 121/60 (!) 126/57  Pulse: 95 89  Resp: 20 19  Temp: 98.2 F (36.8 C) 99 F (37.2 C)   Wt Readings from Last 3 Encounters:  11/16/16 80.2 kg (176 lb 12.9 oz)  11/15/16 79.4 kg (175 lb)  11/13/16 79.3 kg (174 lb 14.4 oz)    General: Uncomfortable, unwell-appearing elderly WF. Doesn't look particularly frail. Head:  No asymmetry or swelling.  Eyes:  Slight scleral icterus Ears:  Not hard of hearing  Nose:  No discharge or congestion Mouth:  Tongue midline, dry mucosa. Neck:  No JVD, no TMG, no masses Lungs:  Diminished but clear bilaterally. No labored breathing or cough. Heart: RRR. No MRG. S1, S2 present Abdomen:  Distended, tight. Small amount of bruising at the surgical incision sites. Exquisitely tender throughout mid and upper abdomen. No guarding or rebound. Bowel sounds scant..   Rectal: Deferred   Musc/Skeltl: No joint erythema, swelling or gross deformity Extremities:  No CCE.  Neurologic:  Alert. Oriented times 3. Moves all 4 limbs. Skin:  No rashes, no obvious jaundice. No telangiectasia. Tattoos:  None observed. Nodes:  No cervical adenopathy.   Psych:  Affect  dull consistent with pain.  Intake/Output from previous day: 01/26 0701 - 01/27 0700 In: 170 [P.O.:120; IV Piggyback:50] Out: 550 [Urine:550] Intake/Output this shift: No intake/output data recorded.  LAB RESULTS:  Recent Labs  11/15/16 0958 11/16/16 0725  WBC 14.4* 12.0*  HGB 12.7* 11.2*  HCT 38.3* 36.3*  PLT 298 246   BMET Lab Results  Component Value Date   NA 139 11/16/2016   NA 136 11/15/2016   NA 139 11/14/2016   K 3.7 11/16/2016   K 3.7 11/15/2016   K 4.0 11/14/2016   CL 104 11/16/2016   CL 97 (L) 11/15/2016   CL 103 11/14/2016   CO2 26 11/16/2016   CO2 28 11/15/2016   CO2 30 11/14/2016   GLUCOSE 133 (H) 11/16/2016   GLUCOSE 183 (H) 11/15/2016   GLUCOSE 195 (H) 11/14/2016   BUN 9 11/16/2016   BUN 8 11/15/2016   BUN 12 11/14/2016   CREATININE 0.89 11/16/2016   CREATININE 0.68 11/15/2016   CREATININE 0.91 11/14/2016   CALCIUM 8.5 (L) 11/16/2016   CALCIUM 9.2 11/15/2016   CALCIUM 8.2 (L) 11/14/2016   LFT  Recent Labs  11/14/16 0455 11/15/16 0958 11/16/16 0725  PROT 5.6* 7.6 5.5*  ALBUMIN 3.0* 3.8 2.2*  AST 179* 162* 37  ALT 212* 247* 115*  ALKPHOS 231* 341* 197*  BILITOT 0.6 3.8* 2.0*   PT/INR Lab Results  Component Value Date   INR 1.37 11/16/2016   INR 1.23 10/20/2016   INR 1.05 10/08/2016   Hepatitis Panel No results for input(s): HEPBSAG, HCVAB, HEPAIGM, HEPBIGM in the last 72 hours. C-Diff No components found for:  CDIFF Lipase     Component Value Date/Time   LIPASE 55 (H) 11/15/2016 0814    Drugs of Abuse  No results found for: LABOPIA, COCAINSCRNUR, LABBENZ, AMPHETMU, THCU, LABBARB   RADIOLOGY STUDIES: US Abdomen Limited Ruq  Result Date: 11/15/2016 CLINICAL DATA:  Abdominal pain EXAM: US ABDOMEN LIMITED - RIGHT UPPER QUADRANT COMPARISON:  CT 1 month 1,016 FINDINGS: Gallbladder: Prior cholecystectomy Common bile duct: Diameter: Dilated, 12 mm. Dilated duct as seen on intraoperative cholangiogram. History new since prior  CT. Liver: Intrahepatic biliary ductal dilatation. Increased echotexture throughout the liver. IMPRESSION: Prior cholecystectomy. Dilated intrahepatic and extrahepatic biliary ducts. Common bile duct dilated to 12 mm. Cannot exclude distal obstructing duct stone. This could be further evaluated with ERCP or MRCP. Fatty liver. Electronically Signed   By: Rolm Baptise M.D.   On: 11/15/2016 12:23     IMPRESSION:   *  Choledocholithiasis.  Patient on Zosyn.  *  History acute cholecystitis treated in mid 09/2016 with percutaneous drainage. Status post laparoscopic cholecystectomy 11/14/15. Intraoperative cholangiogram revealed dilated bile duct and possible choledocholithiasis.   PLAN:     *  ERCP Tomorrow. Obtain CT scan abdomen pelvis with contrast today to rule out biloma. Added lactated Ringer's to run at 1 25 mL/h as anticipate he will not be getting much in the way of by mouth for the next 24 hours. After the CT scan is completed today, I have told the nurse that he can have clear liquids if he wants but he needs to be nothing by mouth after midnight for the ERCP tomorrow  *  As preparation for ERCP and probable sphincterotomy, I have discontinued the Lovenox dose tonight. It can be resumed tomorrow evening. In place I have ordered sequential compression stockings.  Azucena Freed  11/16/2016, 12:10 PM Pager: [phone]  GI ATTENDING  History, laboratory's, x-rays, I see reviewed. Patient personally seen and examined. Wife in room. Agree with comprehensive consultation note as outlined above Transferred to this hospital as noted available GI doctors this weekend at his health facility. Has gallbladder removed there 3 days ago. Radiologist questioned distal bile duct obstruction. LFTs appear that time. Now with pain and elevated LFTs. Abdomen distended and tender on exam. Somewhat uncomfortable but not acutely ill-appearing. White blood cell count mildly elevated. On antibiotics. Agree with  continuing antibiotics, pain management, hydration, and urgent CT scan of the abdomen and pelvis to rule out postoperative complications. If negative, suspect bile duct stone and would proceed with ERCP.The nature of the procedure, as well as the risks, benefits, and alternatives were carefully and thoroughly reviewed with the patient. Ample time for discussion and questions allowed. The patient understood, was satisfied, and agreed to proceed.  Docia Chuck. Geri Seminole., M.D. [facility] Division of Gastroenterology  ADDENDUM CT reviewed with Dr. Rosana Hoes. More upper quadrant and abdominal simple appearing fluid present than typical. Will proceed to HIDA scan and plan ERCP in am. Patient is NPO. Continue antibiotics.  Docia Chuck. Geri Seminole., M.D. [facility] Division of Gastroenterology

## 2016-11-16 NOTE — Progress Notes (Signed)
Pt transfer for [facility].

## 2016-11-16 NOTE — Progress Notes (Signed)
[facility] PROGRESS NOTE  Henry Lindsey [id number] DOB: 1932/12/27 DOA: 11/15/2016  PCP: [name], MD  Brief History/Interval Summary: 83 year old Caucasian male with a past medical history of hypertension, non-insulin-dependent diabetes, coronary artery disease, peripheral arterial disease, GERD who had acute cholecystitis on Thanksgiving and had a drain placement initially. He underwent cholecystectomy at [facility] on 1/24. He presented with complains of worsening abdominal pain. He was found to have elevated alkaline phosphatase. Imaging studies raised concern for possible CBD obstruction. He was transferred here for further management due to need for ERCP.  Reason for Visit: Biliary obstruction. Possible cholangitis.  Consultants: Gastroenterology  Procedures: Recent cholecystectomy on 1/24 at   Antibiotics: Zosyn  Subjective/Interval History: Patient appears to be mildly confused this morning. His daughter is at bedside. He complains of pain all over his abdomen, mainly in the right upper side. Hasn't had any nausea or vomiting.  ROS: Denies any chest pain or shortness of breath  Objective:  Vital Signs  Vitals:   11/15/16 1809 11/15/16 2300 11/16/16 0532  BP: (!) 155/82 121/60 (!) 126/57  Pulse: (!) 115 95 89  Resp:  20 19  Temp: 98.7 F (37.1 C) 98.2 F (36.8 C) 99 F (37.2 C)  TempSrc: Oral Oral   SpO2: 93% 93% 91%  Weight:   80.2 kg (176 lb 12.9 oz)    Intake/Output Summary (Last 24 hours) at 11/16/16 1117 Last data filed at 11/16/16 0700  Gross per 24 hour  Intake              170 ml  Output              550 ml  Net             -380 ml   Filed Weights   11/16/16 0532  Weight: 80.2 kg (176 lb 12.9 oz)    General appearance: alert, cooperative, appears stated age, distracted and no distress Resp: Diminished air entry at the bases. No wheezing, no rhonchi. Few crackles. Normal effort. Cardio: regular rate and  rhythm, S1, S2 normal, no murmur, click, rub or gallop GI: Incision sites look clean. Abdomen is diffusely tender mainly in the right upper quadrant and epigastric area. No masses or organomegaly. No rebound, rigidity or guarding. Bowel sounds are sluggish but present. Extremities: extremities normal, atraumatic, no cyanosis or edema Neurologic: Awake, alert. Somewhat distracted and confused. Cranial nerves II-12 intact. No focal neurological deficits.  Lab Results:  Data Reviewed: I have personally reviewed following labs and imaging studies  CBC:  Recent Labs Lab 11/15/16 0958 11/16/16 0725  WBC 14.4* 12.0*  HGB 12.7* 11.2*  HCT 38.3* 36.3*  MCV 82.8 86.4  PLT 298 0000000    Basic Metabolic Panel:  Recent Labs Lab 11/11/16 1142 11/13/16 1806 11/14/16 0455 11/15/16 0958 11/16/16 0725  NA  --  137 139 136 139  K 3.7 3.7 4.0 3.7 3.7  CL  --  102 103 97* 104  CO2  --  28 30 28 26   GLUCOSE  --  236* 195* 183* 133*  BUN  --  15 12 8 9   CREATININE  --  0.99 0.91 0.68 0.89  CALCIUM  --  8.0* 8.2* 9.2 8.5*    GFR: Estimated Creatinine Clearance: 69.3 mL/min (by C-G formula based on SCr of 0.89 mg/dL).  Liver Function Tests:  Recent Labs Lab 11/13/16 1806 11/14/16 0455 11/15/16 0958 11/16/16 0725  AST 296* 179* 162* 37  ALT  276* 212* 247* 115*  ALKPHOS 277* 231* 341* 197*  BILITOT 0.8 0.6 3.8* 2.0*  PROT 6.2* 5.6* 7.6 5.5*  ALBUMIN 3.5 3.0* 3.8 2.2*     Recent Labs Lab 11/15/16 0958  LIPASE 55*    Coagulation Profile:  Recent Labs Lab 11/16/16 0725  INR 1.37    Cardiac Enzymes:  Recent Labs Lab 11/15/16 0958  TROPONINI <0.03    CBG:  Recent Labs Lab 11/14/16 0759 11/15/16 2304 11/16/16 0042 11/16/16 0434 11/16/16 0807  GLUCAP 177* 143* 156* 132* 134*    Radiology Studies: US Abdomen Limited Ruq  Result Date: 11/15/2016 CLINICAL DATA:  Abdominal pain EXAM: US ABDOMEN LIMITED - RIGHT UPPER QUADRANT COMPARISON:  CT 1 month 1,016  FINDINGS: Gallbladder: Prior cholecystectomy Common bile duct: Diameter: Dilated, 12 mm. Dilated duct as seen on intraoperative cholangiogram. History new since prior CT. Liver: Intrahepatic biliary ductal dilatation. Increased echotexture throughout the liver. IMPRESSION: Prior cholecystectomy. Dilated intrahepatic and extrahepatic biliary ducts. Common bile duct dilated to 12 mm. Cannot exclude distal obstructing duct stone. This could be further evaluated with ERCP or MRCP. Fatty liver. Electronically Signed   By: Rolm Baptise M.D.   On: 11/15/2016 12:23     Medications:  Scheduled: . atorvastatin  40 mg Oral q1800  . cholecalciferol  2,000 Units Oral Daily  . enoxaparin (LOVENOX) injection  40 mg Subcutaneous QHS  . famotidine (PEPCID) IV  20 mg Intravenous Q12H  . feeding supplement (ENSURE ENLIVE)  237 mL Oral BID  . gabapentin  400 mg Oral TID  . insulin aspart  0-9 Units Subcutaneous Q4H  . insulin glargine  8 Units Subcutaneous QHS  . latanoprost  1 drop Both Eyes QHS  . metoprolol succinate  100 mg Oral Daily  . multivitamin with minerals  1 tablet Oral Daily  . pantoprazole  40 mg Oral BID  . piperacillin-tazobactam (ZOSYN)  IV  3.375 g Intravenous Q8H  . senna  1 tablet Oral BID  . tamsulosin  0.4 mg Oral QPC breakfast   Continuous:  [id number] **OR** acetaminophen, bisacodyl, fentaNYL (SUBLIMAZE) injection, HYDROmorphone (DILAUDID) injection, magic mouthwash, ondansetron (ZOFRAN) IV, polyethylene glycol  Assessment/Plan:  Principal Problem:   Biliary obstruction Active Problems:   Hypertension   Type 2 diabetes, uncontrolled, with peripheral circulatory disorder (HCC)   CAD (coronary artery disease)    Biliary obstruction/concern for cholangitis Sent from [place] with severe upper abd pain and elevated bilirubin after lap chole 11/13/16 with CBD obstruction suggested by intraoperative cholangiogram. Ultrasound also shows dilated CBD. Await GI input. Patient  might need to undergo ERCP. Bilirubin level noted to be improved this morning. Continue Zosyn for now. WBC is better as well. Keep nothing by mouth.  History of CAD, PAD  Seems to be stable. Continue Lipitor and Toprol as tolerated; hold ASA and cilostazol for likely endoscopy.  Insulin-dependent DM  A1c was 7.6% in December 2017. Managed at home with metformin, Januvia, glipizide, and Humalog 75/25 6 units qAM and 8 units qPM; these are held on admission. Continue to monitor CBGs. Continue Lantus.  Essential Hypertension  Monitor blood pressures. Continue with Toprol.   DVT Prophylaxis: Lovenox    Code Status: Full code  Family Communication: Discussed with the patient and his daughter  Disposition Plan: Await GI input.    LOS: 1 day   [facility] Pager [phone] 11/16/2016, 11:17 AM  If 7PM-7AM, please contact night-coverage at [email or web address], password [facility]

## 2016-11-16 NOTE — Progress Notes (Signed)
Pt NPO midnight for ERCP procedure.

## 2016-11-17 ENCOUNTER — Inpatient Hospital Stay (HOSPITAL_COMMUNITY): Payer: Medicare Other | Admitting: Anesthesiology

## 2016-11-17 ENCOUNTER — Inpatient Hospital Stay (HOSPITAL_COMMUNITY): Payer: Medicare Other

## 2016-11-17 ENCOUNTER — Encounter (HOSPITAL_COMMUNITY)
Admission: AD | Disposition: A | Discharge status: Home / Self Care | Payer: Self-pay | Source: Other Acute Inpatient Hospital | Attending: Internal Medicine

## 2016-11-17 ENCOUNTER — Encounter (HOSPITAL_COMMUNITY): Payer: Self-pay | Admitting: Anesthesiology

## 2016-11-17 DIAGNOSIS — K838 Other specified diseases of biliary tract: Secondary | ICD-10-CM

## 2016-11-17 DIAGNOSIS — K9189 Other postprocedural complications and disorders of digestive system: Principal | ICD-10-CM

## 2016-11-17 HISTORY — PX: ERCP: SHX5425

## 2016-11-17 LAB — COMPREHENSIVE METABOLIC PANEL
ALBUMIN: 2.1 g/dL — AB (ref 3.5–5.0)
ALK PHOS: 160 U/L — AB (ref 38–126)
ALT: 75 U/L — ABNORMAL HIGH (ref 17–63)
ANION GAP: 8 (ref 5–15)
AST: 24 U/L (ref 15–41)
BUN: 11 mg/dL (ref 6–20)
CALCIUM: 8.4 mg/dL — AB (ref 8.9–10.3)
CO2: 25 mmol/L (ref 22–32)
Chloride: 102 mmol/L (ref 101–111)
Creatinine, Ser: 0.97 mg/dL (ref 0.61–1.24)
GFR calc Af Amer: >60 mL/min (ref >60)
GFR calc non Af Amer: >60 mL/min (ref >60)
GLUCOSE: 122 mg/dL — AB (ref 65–99)
POTASSIUM: 3.4 mmol/L — AB (ref 3.5–5.1)
SODIUM: 135 mmol/L (ref 135–145)
Total Bilirubin: 1.7 mg/dL — ABNORMAL HIGH (ref 0.3–1.2)
Total Protein: 5.4 g/dL — ABNORMAL LOW (ref 6.5–8.1)

## 2016-11-17 LAB — GLUCOSE, CAPILLARY
GLUCOSE-CAPILLARY: 106 mg/dL — AB (ref 65–99)
GLUCOSE-CAPILLARY: 113 mg/dL — AB (ref 65–99)
GLUCOSE-CAPILLARY: 133 mg/dL — AB (ref 65–99)
GLUCOSE-CAPILLARY: 168 mg/dL — AB (ref 65–99)
Glucose-Capillary: 131 mg/dL — ABNORMAL HIGH (ref 65–99)
Glucose-Capillary: 126 mg/dL — ABNORMAL HIGH (ref 65–99)
Glucose-Capillary: 129 mg/dL — ABNORMAL HIGH (ref 65–99)
Glucose-Capillary: 204 mg/dL — ABNORMAL HIGH (ref 65–99)
Glucose-Capillary: 245 mg/dL — ABNORMAL HIGH (ref 65–99)

## 2016-11-17 LAB — CBC
HCT: 33.6 % — ABNORMAL LOW (ref 39.0–52.0)
Hemoglobin: 10.3 g/dL — ABNORMAL LOW (ref 13.0–17.0)
MCH: 26.3 pg (ref 26.0–34.0)
MCHC: 30.7 g/dL (ref 30.0–36.0)
MCV: 85.7 fL (ref 78.0–100.0)
Platelets: 244 10*3/uL (ref 150–400)
RBC: 3.92 MIL/uL — ABNORMAL LOW (ref 4.22–5.81)
RDW: 14.5 % (ref 11.5–15.5)
WBC: 9.1 10*3/uL (ref 4.0–10.5)

## 2016-11-17 SURGERY — ERCP, WITH INTERVENTION IF INDICATED
Anesthesia: General

## 2016-11-17 MED ORDER — ROCURONIUM BROMIDE 100 MG/10ML IV SOLN
INTRAVENOUS | Status: DC | PRN
  Administered 2016-11-17: 40 mg via INTRAVENOUS

## 2016-11-17 MED ORDER — POTASSIUM CHLORIDE CRYS ER 20 MEQ PO TBCR
40.0000 meq | EXTENDED_RELEASE_TABLET | Freq: Once | ORAL | Status: AC
  Administered 2016-11-17: 40 meq via ORAL
  Filled 2016-11-17: qty 2

## 2016-11-17 MED ORDER — SODIUM CHLORIDE 0.9 % IV SOLN: INTRAVENOUS | Status: DC

## 2016-11-17 MED ORDER — INDOMETHACIN 50 MG RE SUPP
100.0000 mg | Freq: Once | RECTAL | Status: AC
  Administered 2016-11-17: 100 mg via RECTAL

## 2016-11-17 MED ORDER — SODIUM CHLORIDE 0.9 % IV SOLN
INTRAVENOUS | Status: DC
  Administered 2016-11-17: 1 mL via INTRAVENOUS
  Administered 2016-11-17: 08:00:00 via INTRAVENOUS

## 2016-11-17 MED ORDER — LIDOCAINE HCL (CARDIAC) 20 MG/ML IV SOLN
INTRAVENOUS | Status: DC | PRN
  Administered 2016-11-17: 60 mg via INTRAVENOUS

## 2016-11-17 MED ORDER — GLUCAGON HCL RDNA (DIAGNOSTIC) 1 MG IJ SOLR
INTRAMUSCULAR | Status: AC
  Filled 2016-11-17: qty 2

## 2016-11-17 MED ORDER — IOPAMIDOL (ISOVUE-300) INJECTION 61%
INTRAVENOUS | Status: DC | PRN
  Administered 2016-11-17: 100 mL

## 2016-11-17 MED ORDER — IOPAMIDOL (ISOVUE-300) INJECTION 61%
INTRAVENOUS | Status: AC
  Filled 2016-11-17: qty 50

## 2016-11-17 MED ORDER — FENTANYL CITRATE (PF) 100 MCG/2ML IJ SOLN
25.0000 ug | INTRAMUSCULAR | Status: DC | PRN
  Administered 2016-11-17: 25 ug via INTRAVENOUS

## 2016-11-17 MED ORDER — PROPOFOL 10 MG/ML IV BOLUS
INTRAVENOUS | Status: AC
  Filled 2016-11-17: qty 20

## 2016-11-17 MED ORDER — FENTANYL CITRATE (PF) 100 MCG/2ML IJ SOLN
INTRAMUSCULAR | Status: AC
  Filled 2016-11-17: qty 2

## 2016-11-17 MED ORDER — MIDAZOLAM HCL 2 MG/2ML IJ SOLN
INTRAMUSCULAR | Status: AC
  Filled 2016-11-17: qty 2

## 2016-11-17 MED ORDER — INDOMETHACIN 50 MG RE SUPP
RECTAL | Status: DC | PRN
  Administered 2016-11-17: 100 mg via RECTAL

## 2016-11-17 MED ORDER — INDOMETHACIN 50 MG RE SUPP
RECTAL | Status: AC
  Filled 2016-11-17: qty 1

## 2016-11-17 MED ORDER — GLUCAGON HCL RDNA (DIAGNOSTIC) 1 MG IJ SOLR
INTRAMUSCULAR | Status: DC | PRN
  Administered 2016-11-17: .5 mg via INTRAVENOUS

## 2016-11-17 MED ORDER — PROPOFOL 10 MG/ML IV BOLUS
INTRAVENOUS | Status: DC | PRN
  Administered 2016-11-17: 120 mg via INTRAVENOUS

## 2016-11-17 MED ORDER — PHENYLEPHRINE HCL 10 MG/ML IJ SOLN
INTRAMUSCULAR | Status: DC | PRN
  Administered 2016-11-17: 80 ug via INTRAVENOUS
  Administered 2016-11-17 (×2): 40 ug via INTRAVENOUS

## 2016-11-17 MED ORDER — ONDANSETRON HCL 4 MG/2ML IJ SOLN
INTRAMUSCULAR | Status: DC | PRN
  Administered 2016-11-17: 4 mg via INTRAVENOUS

## 2016-11-17 MED ORDER — PHENYLEPHRINE HCL 10 MG/ML IJ SOLN
INTRAMUSCULAR | Status: DC | PRN
  Administered 2016-11-17: 25 ug/min via INTRAVENOUS

## 2016-11-17 MED ORDER — SUGAMMADEX SODIUM 200 MG/2ML IV SOLN
INTRAVENOUS | Status: DC | PRN
  Administered 2016-11-17: 200 mg via INTRAVENOUS

## 2016-11-17 NOTE — Consult Note (Signed)
Reason for Consult:bile leak after cholecystectomy Referring Physician: Sederick Jacobsen is an 83 y.o. male.  HPI: Tyresse initially had cholecystitis last Thanksgiving. Due to his medical issues, a perc chole drain was placed. He improved medically and underwent lap chole 1/24 by Dr. Hampton Abbot at [facility]. He was discharged from their 1/25. He returned to the emergency room there 1/26 complaining of abdominal pain. Evaluation was concerning for postoperative bile leak. He was transferred to the hospitalist service at [facility]. He underwent hida scan confirming bile leak. He underwent ERCP with stent placement today by Dr. Scarlette Shorts. I was asked to see him for surgical management. He denies abdominal pain and has taken a clear liquid diet after ERCP.  Past Medical History:  Diagnosis Date  . BPH (benign prostatic hyperplasia)   . Cancer ([place])   . Carotid artery stenosis 11/2010   <50% bilaterally  . CVA (cerebral infarction) 11/2010   r thalamic lacunar  . Diabetes mellitus   . GERD (gastroesophageal reflux disease)   . Hyperlipidemia   . Hypertension   . Neuromuscular disorder ([place])   . Peripheral vascular disease in diabetes mellitus ([place]) 06/2012    95% occlusion s/p PTCA R SFA Dew Sept 2013    Past Surgical History:  Procedure Laterality Date  . CHOLECYSTECTOMY N/A 11/13/2016   Procedure: LAPAROSCOPIC CHOLECYSTECTOMY WITH INTRAOPERATIVE CHOLANGIOGRAM;  Surgeon: Olean Ree, MD;  Location: [facility] ORS;  Service: General;  Laterality: N/A;  . IR GENERIC HISTORICAL  10/06/2016   IR PERC CHOLECYSTOSTOMY 10/06/2016 Aletta Edouard, MD [facility]-INTER[facility] RAD  . JOINT REPLACEMENT Left 2014   left knee  . UPPER GASTROINTESTINAL ENDOSCOPY    . WRIST SURGERY      Family History  Problem Relation Age of Onset  . Diabetes Mother     Social History:  reports that he quit smoking about 48 years ago. His smoking use included Cigarettes. He started smoking about 81 years ago. He has a 16.50  pack-year smoking history. He has never used smokeless tobacco. He reports that he does not drink alcohol or use drugs.  Allergies: No Known Allergies  Medications: I have reviewed the patient's current medications.  Results for orders placed or performed during the hospital encounter of 11/15/16 (from the past 48 hour(s))  Glucose, capillary     Status: Abnormal   Collection Time: 11/15/16 11:04 PM  Result Value Ref Range   Glucose-Capillary 143 (H) 65 - 99 mg/dL   Comment 1 Notify RN    Comment 2 Document in Chart   Glucose, capillary     Status: Abnormal   Collection Time: 11/16/16 12:42 AM  Result Value Ref Range   Glucose-Capillary 156 (H) 65 - 99 mg/dL  Glucose, capillary     Status: Abnormal   Collection Time: 11/16/16  4:34 AM  Result Value Ref Range   Glucose-Capillary 132 (H) 65 - 99 mg/dL   Comment 1 Notify RN    Comment 2 Document in Chart   Comprehensive metabolic panel     Status: Abnormal   Collection Time: 11/16/16  7:25 AM  Result Value Ref Range   Sodium 139 135 - 145 mmol/L   Potassium 3.7 3.5 - 5.1 mmol/L   Chloride 104 101 - 111 mmol/L   CO2 26 22 - 32 mmol/L   Glucose, Bld 133 (H) 65 - 99 mg/dL   BUN 9 6 - 20 mg/dL   Creatinine, Ser 0.89 0.61 - 1.24 mg/dL   Calcium 8.5 (  L) 8.9 - 10.3 mg/dL   Total Protein 5.5 (L) 6.5 - 8.1 g/dL   Albumin 2.2 (L) 3.5 - 5.0 g/dL   AST 37 15 - 41 U/L   ALT 115 (H) 17 - 63 U/L   Alkaline Phosphatase 197 (H) 38 - 126 U/L   Total Bilirubin 2.0 (H) 0.3 - 1.2 mg/dL   GFR calc non Af Amer >60 >60 mL/min   GFR calc Af Amer >60 >60 mL/min    Comment: (NOTE) The eGFR has been calculated using the CKD EPI equation. This calculation has not been validated in all clinical situations. eGFR's persistently <60 mL/min signify possible Chronic Kidney Disease.    Anion gap 9 5 - 15  Protime-INR     Status: Abnormal   Collection Time: 11/16/16  7:25 AM  Result Value Ref Range   Prothrombin Time 17.0 (H) 11.4 - 15.2 seconds   INR  1.37   CBC     Status: Abnormal   Collection Time: 11/16/16  7:25 AM  Result Value Ref Range   WBC 12.0 (H) 4.0 - 10.5 K/uL   RBC 4.20 (L) 4.22 - 5.81 MIL/uL   Hemoglobin 11.2 (L) 13.0 - 17.0 g/dL   HCT 36.3 (L) 39.0 - 52.0 %   MCV 86.4 78.0 - 100.0 fL   MCH 26.7 26.0 - 34.0 pg   MCHC 30.9 30.0 - 36.0 g/dL   RDW 14.7 11.5 - 15.5 %   Platelets 246 150 - 400 K/uL  Glucose, capillary     Status: Abnormal   Collection Time: 11/16/16  8:07 AM  Result Value Ref Range   Glucose-Capillary 134 (H) 65 - 99 mg/dL  Glucose, capillary     Status: Abnormal   Collection Time: 11/16/16  1:03 PM  Result Value Ref Range   Glucose-Capillary 126 (H) 65 - 99 mg/dL  Glucose, capillary     Status: Abnormal   Collection Time: 11/16/16  4:29 PM  Result Value Ref Range   Glucose-Capillary 113 (H) 65 - 99 mg/dL  Glucose, capillary     Status: Abnormal   Collection Time: 11/16/16  8:13 PM  Result Value Ref Range   Glucose-Capillary 103 (H) 65 - 99 mg/dL  Glucose, capillary     Status: Abnormal   Collection Time: 11/17/16 12:10 AM  Result Value Ref Range   Glucose-Capillary 113 (H) 65 - 99 mg/dL  Glucose, capillary     Status: Abnormal   Collection Time: 11/17/16  4:28 AM  Result Value Ref Range   Glucose-Capillary 131 (H) 65 - 99 mg/dL  CBC     Status: Abnormal   Collection Time: 11/17/16  5:23 AM  Result Value Ref Range   WBC 9.1 4.0 - 10.5 K/uL   RBC 3.92 (L) 4.22 - 5.81 MIL/uL   Hemoglobin 10.3 (L) 13.0 - 17.0 g/dL   HCT 33.6 (L) 39.0 - 52.0 %   MCV 85.7 78.0 - 100.0 fL   MCH 26.3 26.0 - 34.0 pg   MCHC 30.7 30.0 - 36.0 g/dL   RDW 14.5 11.5 - 15.5 %   Platelets 244 150 - 400 K/uL  Comprehensive metabolic panel     Status: Abnormal   Collection Time: 11/17/16  5:23 AM  Result Value Ref Range   Sodium 135 135 - 145 mmol/L   Potassium 3.4 (L) 3.5 - 5.1 mmol/L   Chloride 102 101 - 111 mmol/L   CO2 25 22 - 32 mmol/L   Glucose, Bld 122 (H)  65 - 99 mg/dL   BUN 11 6 - 20 mg/dL   Creatinine,  Ser 0.97 0.61 - 1.24 mg/dL   Calcium 8.4 (L) 8.9 - 10.3 mg/dL   Total Protein 5.4 (L) 6.5 - 8.1 g/dL   Albumin 2.1 (L) 3.5 - 5.0 g/dL   AST 24 15 - 41 U/L   ALT 75 (H) 17 - 63 U/L   Alkaline Phosphatase 160 (H) 38 - 126 U/L   Total Bilirubin 1.7 (H) 0.3 - 1.2 mg/dL   GFR calc non Af Amer >60 >60 mL/min   GFR calc Af Amer >60 >60 mL/min    Comment: (NOTE) The eGFR has been calculated using the CKD EPI equation. This calculation has not been validated in all clinical situations. eGFR's persistently <60 mL/min signify possible Chronic Kidney Disease.    Anion gap 8 5 - 15  Glucose, capillary     Status: Abnormal   Collection Time: 11/17/16  7:35 AM  Result Value Ref Range   Glucose-Capillary 106 (H) 65 - 99 mg/dL  Glucose, capillary     Status: Abnormal   Collection Time: 11/17/16 10:45 AM  Result Value Ref Range   Glucose-Capillary 133 (H) 65 - 99 mg/dL  Glucose, capillary     Status: Abnormal   Collection Time: 11/17/16 11:41 AM  Result Value Ref Range   Glucose-Capillary 126 (H) 65 - 99 mg/dL  Glucose, capillary     Status: Abnormal   Collection Time: 11/17/16 12:35 PM  Result Value Ref Range   Glucose-Capillary 129 (H) 65 - 99 mg/dL    Nm Hepatobiliary Liver Func  Result Date: 11/17/2016 CLINICAL DATA:  83 year old male status post cholecystectomy. Evaluate for bile leak. EXAM: NUCLEAR MEDICINE HEPATOBILIARY IMAGING TECHNIQUE: Sequential images of the abdomen were obtained out to 60 minutes following intravenous administration of radiopharmaceutical. RADIOPHARMACEUTICALS:  5.15 mCi Tc-77m Choletec IV COMPARISON:  Abdominal CT dated 11/16/2016 FINDINGS: Prompt uptake and biliary excretion of activity by the liver is seen. There is accumulation of radiotracer outside of the biliary tree corresponding to the fluid collection seen on the CT within the gallbladder fossa as well as sacculation of the radiopharmaceutical within the fluid collection along the inferior surface of the  left lobe of the liver. Findings most consistent with contained bite leak or biloma. No extension of radiotracer noted inferiorly within the abdomen. IMPRESSION: Extra-biliary collection of radiopharmaceutical in the gallbladder fossa and along the inferior surface of the left lobe of the liver corresponding to the fluid collections seen on the CT and most consistent with contained bite leak/biloma. Electronically Signed   By: AAnner CreteM.D.   On: 11/17/2016 00:41   Ct Abdomen Pelvis W Contrast  Result Date: 11/16/2016 CLINICAL DATA:  Patient with recent cholecystectomy 3 days prior. Dilated intrahepatic and extrahepatic bile ducts. Evaluate for biloma. EXAM: CT ABDOMEN AND PELVIS WITH CONTRAST TECHNIQUE: Multidetector CT imaging of the abdomen and pelvis was performed using the standard protocol following bolus administration of intravenous contrast. CONTRAST:  1014mISOVUE-300 IOPAMIDOL (ISOVUE-300) INJECTION 61% COMPARISON:  CT abdomen pelvis 10/21/2016. FINDINGS: Lower chest: Normal heart size. Coronary arterial vascular calcifications. Small layering bilateral pleural effusions. Subpleural ground-glass and consolidative opacities within the lower lobes bilaterally. Hepatobiliary: Liver is normal in size and contour. Postsurgical changes compatible with interval cholecystectomy. There is flocculent fluid demonstrated within the gallbladder fossa. There are a few small foci of gas within the gallbladder fossa. Additionally, there is fluid extending from the gallbladder fossa into the  gastrohepatic ligament and about the liver. Additionally fluid extends within the left upper quadrant around the spleen. Mild intrahepatic and extrahepatic biliary ductal dilatation. Pancreas: Mild pancreatic atrophy. Spleen: Unremarkable. Adrenals/Urinary Tract: The adrenal glands are normal. 1.5 cm cyst superior pole right kidney, 1.5 cm cyst inferior pole right kidney and 1.9 cm cyst interpolar region left kidney. There  is a 1.5 cm cyst within the superior pole left kidney. Urinary bladder is unremarkable. Stomach/Bowel: Sigmoid colonic diverticulosis. Small amount of free fluid in the pelvis. Free intraperitoneal air within the upper abdomen. Normal appendix. There a few mildly distended loops of small bowel within the central abdomen. Normal morphology of the stomach. Vascular/Lymphatic: Peripheral calcified atherosclerotic plaque. Retroaortic left renal vein. No retroperitoneal lymphadenopathy. Reproductive: Prostate unremarkable. Other: Small bilateral fat containing inguinal hernias, left-greater-than-right. Musculoskeletal: Multilevel degenerative disc disease. Grade 1 anterolisthesis L4 on L5. IMPRESSION: Postsurgical changes compatible with interval cholecystectomy. Flocculent fluid within the gallbladder fossa likely secondary to postsurgical change/ surgical material. Recommend correlation with operative history. Additionally, there is fluid extending from the cholecystectomy site into the central abdomen and left upper quadrant which is nonspecific however may be secondary to a bile leak. Recommend correlation with HIDA scan. Small amount of free intraperitoneal air within the upper abdomen likely reflective of recent postoperative state. Mild intrahepatic and extrahepatic biliary ductal dilatation. Aortic atherosclerosis. Mild distention of the small bowel centrally likely secondary to ileus. Small layering bilateral pleural effusions with underlying ground-glass consolidative opacities favored to represent atelectasis. Infection not excluded. These results will be called to the ordering clinician or representative by the Radiologist Assistant, and communication documented in the PACS or zVision Dashboard. Electronically Signed   By: Lovey Newcomer M.D.   On: 11/16/2016 20:04    Review of Systems  Constitutional: Negative for chills and fever.  HENT: Negative.   Eyes: Negative for blurred vision.  Respiratory:  Negative for cough and shortness of breath.   Cardiovascular: Negative for chest pain.  Gastrointestinal:       Abdominal pain has resolved  Genitourinary: Negative.   Musculoskeletal: Negative.   Skin: Negative.   Neurological: Negative.   Endo/Heme/Allergies: Negative.   Psychiatric/Behavioral: Negative.    Blood pressure (!) 152/66, pulse 88, temperature 99 F (37.2 C), temperature source Oral, resp. rate 15, weight 79.7 kg (175 lb 10.6 oz), SpO2 92 %. Physical Exam  Constitutional: He is oriented to person, place, and time. He appears well-developed and well-nourished. No distress.  HENT:  Head: Normocephalic.  Right Ear: External ear normal.  Left Ear: External ear normal.  Nose: Nose normal.  Mouth/Throat: Oropharynx is clear and moist.  Neck: Neck supple. No tracheal deviation present.  Cardiovascular: Normal rate and normal heart sounds.   Respiratory: Effort normal and breath sounds normal. No stridor. No respiratory distress. He has no wheezes.  GI: Soft. He exhibits distension. There is no tenderness. There is no rebound and no guarding.  Mild distention, incisions all clean dry and intact, no tenderness  Musculoskeletal: Normal range of motion.  Neurological: He is alert and oriented to person, place, and time. He exhibits normal muscle tone.  Skin: Skin is warm.  Psychiatric: He has a normal mood and affect.    Assessment/Plan: Status post laparoscopic cholecystectomy at [facility] on 1/24. Postoperative bile leak managed by ERCP with stent. Moderate biloma on CT. He has improved clinically after ERCP. We will follow along. There is still a chance his biloma may require percutaneous drainage but he is  doing well right now. I spoke with his family.  Nell Gales E 11/17/2016, 2:48 PM

## 2016-11-17 NOTE — H&P (View-Only) (Signed)
[facility] Consult: 12:10 PM 11/16/2016  LOS: 1 day    Referring Provider: Dr Desmond Lope  Primary Care Physician:  [name], MD Primary Gastroenterologist:  Althia Forts.      Reason for Consultation:  Question bile duct obstruction.   HPI: Henry Lindsey is a 83 y.o. male.  PMH HTN. NIDDM. CAD. Peripheral arterial disease. Carotid artery stenosis.  History CVA.  Emphysema.  Remote colonoscopy, he dos  Patient status post percutaneous cholecystostomy tube 10/06/16 for acute cholecystitis. CT scan prior to drain placement showed probable acute cholecystitis with distended and inflamed gallbladder, gallbladder stones.  They suspected a 4-5 mm CBD stone and raised the question of him having other nonvisible ductal stones. 10/07/16 MRCP revealed decompression of the inflamed gallbladder. No choledocholithiasis. Hepatic steatosis.  Patient improved and the plan was for laparoscopic cholecystectomy at some point in the future.  There was no plan for GI follow-up, though he was followed by Dr. Owens Loffler during the hospitalization.  Patient return to the emergency department 10/21/16 with acute right upper quadrant pain.  LFTs were normal, WBCs 16.7.  Plan was to continue IV antibiotics. MiraLAX was added because there was significant stool burden seen on the CT scan. Patient was to follow-up with the surgeon. CT abdomen pelvis with contrast on 10/21/16 showed the catheter present in the gallbladder fossa. Decompressed gallbladder some mild residual edema and stranding in the right upper quadrant suggestive of mild residual inflammatory changes. Previously noted intrahepatic biliary dilatation had resolved.  Diffuse fatty infiltration of liver. Some calcifications in the region of the common bile duct at the head of  the pancreas. Sigmoid diverticulosis.   Status post laparoscopic cholecystectomy 11/13/16 by Dr Hampton Abbot. Surgeon's interpretation of IOC was that there were no filling defects and contrast was trickling into the intestine. However the radiologists report states "showed dilation of biliary system without significant drainage into the duodenum concerning for obstruction of the distal common bile duct" .  LFTs that day showed alkaline phosphatase 277, AST/ALT 296/276. Total bilirubin normal.  As of the question of CBD stones, patient was observed overnight and LFTs repeated the next day showed alkaline phosphatase 231, AST/ALT 179/212, normal bilirubin.  He was discharged that day and was tolerating diet with no nausea or vomiting, pain was well controlled.. Presented  to [facility] 11/15/16 with worsening abdominal pain.  This pain is confined to the epigastrium, as opposed to previous pain which was in the right upper quadrant. He denies nausea and vomiting.. The alkaline phosphatase 341, AST/ALT 162/247. T bili 3.8. Abdominal ultrasound shows the prior cholecystectomy. Both intra-and extrahepatic bile ducts are dilated with a 12 mm CBD. Though not seen, unable to exclude a distal obstructing bile duct stone. Fatty liver also noted.  Dilaudid is effective in controlling the pain but pain recurs once the meds wear off. No nausea or vomiting. Historically does not suffer from reflux symptoms.    Past Medical History:  Diagnosis Date  . BPH (benign prostatic hyperplasia)   . Cancer ([place])   .  Carotid artery stenosis 11/2010   <50% bilaterally  . CVA (cerebral infarction) 11/2010   r thalamic lacunar  . Diabetes mellitus   . GERD (gastroesophageal reflux disease)   . Hyperlipidemia   . Hypertension   . Neuromuscular disorder ([place])   . Peripheral vascular disease in diabetes mellitus ([place]) 06/2012    95% occlusion s/p PTCA R SFA Dew Sept 2013    Past Surgical History:    Procedure Laterality Date  . CHOLECYSTECTOMY N/A 11/13/2016   Procedure: LAPAROSCOPIC CHOLECYSTECTOMY WITH INTRAOPERATIVE CHOLANGIOGRAM;  Surgeon: Olean Ree, MD;  Location: [facility] ORS;  Service: General;  Laterality: N/A;  . IR GENERIC HISTORICAL  10/06/2016   IR PERC CHOLECYSTOSTOMY 10/06/2016 Aletta Edouard, MD [facility]-INTERV RAD  . JOINT REPLACEMENT Left 2014   left knee  . UPPER GASTROINTESTINAL ENDOSCOPY    . WRIST SURGERY      Prior to Admission medications   Medication Sig Start Date End Date Taking? Authorizing Provider  Alum & Mag Hydroxide-Simeth (MAGIC MOUTHWASH) SOLN Take 5 mLs by mouth 3 (three) times daily as needed for mouth pain.    Historical Provider, MD  amLODipine (NORVASC) 10 MG tablet Take 1 tablet (10 mg total) by mouth daily. 11/11/16   Minna Merritts, MD  aspirin 81 MG tablet Take 1 tablet (81 mg total) by mouth daily. 11/05/16   Minna Merritts, MD  atorvastatin (LIPITOR) 40 MG tablet TAKE 1 TABLET BY MOUTH EVERY DAY 07/15/16   [name], MD  Blood Glucose Monitoring Suppl (ONE TOUCH ULTRA SYSTEM KIT) w/Device KIT 1 kit by Does not apply route once. Use DX code E11.59 10/18/15   [name], MD  Cholecalciferol (VITAMIN D3) 2000 units TABS Take 1 tablet by mouth daily.    Historical Provider, MD  cilostazol (PLETAL) 100 MG tablet TAKE 1 TABLET(100 MG) BY MOUTH TWICE DAILY 08/13/16   [name], MD  famotidine (PEPCID) 20 MG tablet TAKE 1 TABLET(20 MG) BY MOUTH TWICE DAILY 05/08/16   [name], MD  feeding supplement, ENSURE ENLIVE, (ENSURE ENLIVE) LIQD Take 237 mLs by mouth daily. Patient taking differently: Take 237 mLs by mouth 2 (two) times daily.  10/23/16 11/22/16  Nicholes Mango, MD  fenofibrate micronized (LOFIBRA) 134 MG capsule TAKE 1 CAPSULE BY MOUTH EVERY MORNING BEFORE BREAKFAST Patient taking differently: TAKE 1 CAPSULE tid 08/28/16   [name], MD  gabapentin (NEURONTIN) 400 MG capsule TAKE 1 CAPSULE(400 MG) BY MOUTH THREE TIMES DAILY  09/09/16   [name], MD  glipiZIDE (GLUCOTROL) 10 MG tablet TAKE 1 TABLET(10 MG) BY MOUTH TWICE DAILY 03/27/16   [name], MD  glucose blood test strip Use as instructed three times daily 05/03/16   [name], MD  HYDROcodone-acetaminophen (NORCO/VICODIN) 5-325 MG tablet Take 1-2 tablets by mouth every 6 (six) hours as needed for moderate pain. 11/13/16   Olean Ree, MD  INS SYRINGE/NEEDLE 1CC/28G (B-D INSULIN SYRINGE 1CC/28G) 28G X 1/2" 1 ML MISC USE TO ADMINISTER INSULIN DAILY 02/13/16   [name], MD  insulin lispro protamine-lispro (HUMALOG MIX 75/25) (75-25) 100 UNIT/ML SUSP injection Inject 10 units right before  breakfast and 6 units before dinner Patient taking differently: 6-8 Units 2 (two) times daily. Inject 6 qam and 8 qpm 05/03/16   [name], MD  JANUVIA 100 MG tablet TAKE 1 TABLET(100 MG) BY MOUTH DAILY. NOTE DOSE INCREASE 08/13/16   [name], MD  latanoprost (XALATAN) 0.005 % ophthalmic  solution INSTILL 1 DROP INTO BOTH EYES QD 11/01/15   Historical Provider, MD  metFORMIN (GLUCOPHAGE) 1000 MG tablet TAKE 1 TABLET BY MOUTH TWICE DAILY 07/26/16   [name], MD  metoprolol succinate (TOPROL-XL) 100 MG 24 hr tablet Take 1 tablet (100 mg total) by mouth daily. Take with or immediately following a meal. 11/11/16   Minna Merritts, MD  Multiple Vitamin (MULTIVITAMIN WITH MINERALS) TABS tablet Take 1 tablet by mouth daily.    Historical Provider, MD  nitroGLYCERIN (NITROSTAT) 0.4 MG SL tablet Place 1 tablet (0.4 mg total) under the tongue every 5 (five) minutes as needed for chest pain. MAXIMUM 3 TABLETS 10/04/16   [name], MD  ondansetron (ZOFRAN) 4 MG tablet Take 1 tablet (4 mg total) by mouth every 8 (eight) hours as needed for nausea or vomiting. Patient not taking: Reported on 11/11/2016 09/03/16   [name], MD  [facility] DELICA LANCETS 41D MISC Use three times daily to check blood sugar. 10/18/15   [name], MD  pantoprazole (PROTONIX)  40 MG tablet TAKE 1 TABLET(40 MG) BY MOUTH TWICE DAILY BEFORE A MEAL 11/08/16   [name], MD  tamsulosin (FLOMAX) 0.4 MG CAPS capsule Take 0.4 mg by mouth daily after breakfast.    Historical Provider, MD  traMADol (ULTRAM) 50 MG tablet Take 50 mg by mouth at bedtime.  11/24/13   Historical Provider, MD    Scheduled Meds: . atorvastatin  40 mg Oral q1800  . cholecalciferol  2,000 Units Oral Daily  . enoxaparin (LOVENOX) injection  40 mg Subcutaneous QHS  . famotidine (PEPCID) IV  20 mg Intravenous Q12H  . feeding supplement (ENSURE ENLIVE)  237 mL Oral BID  . gabapentin  400 mg Oral TID  . insulin aspart  0-9 Units Subcutaneous Q4H  . insulin glargine  8 Units Subcutaneous QHS  . latanoprost  1 drop Both Eyes QHS  . metoprolol succinate  100 mg Oral Daily  . multivitamin with minerals  1 tablet Oral Daily  . pantoprazole  40 mg Oral BID  . piperacillin-tazobactam (ZOSYN)  IV  3.375 g Intravenous Q8H  . senna  1 tablet Oral BID  . tamsulosin  0.4 mg Oral QPC breakfast   Infusions:  PRN Meds: acetaminophen **OR** acetaminophen, bisacodyl, fentaNYL (SUBLIMAZE) injection, HYDROmorphone (DILAUDID) injection, magic mouthwash, ondansetron (ZOFRAN) IV, polyethylene glycol   Allergies as of 11/15/2016  . (No Known Allergies)    Family History  Problem Relation Age of Onset  . Diabetes Mother     Social History   Social History  . Marital status: Widowed    Spouse name: Enid Derry, deceased  . Number of children: 2  . Years of education: 12   Occupational History  .  Retired   Social History Main Topics  . Smoking status: Former Smoker    Packs/day: 0.50    Years: 33.00    Types: Cigarettes    Start date: 1935/06/23    Quit date: 04/21/1968  . Smokeless tobacco: Never Used  . Alcohol use No  . Drug use: No  . Sexual activity: Yes    Partners: Female    Birth control/ protection: None   Other Topics Concern  . Not on file   Social History Narrative   Widowed in  2014; dating again     REVIEW OF SYSTEMS: Constitutional:  With all of what's been going on with him he generally feels weak. ENT:  No nose bleeds Pulm:  No shortness of breath, no cough. CV:  No palpitations, no LE edema.  GU:  No hematuria.  Long-standing history of urinary frequency. GI:  Generally does not suffer from reflux symptoms. No recent blood per rectum. Heme:  No unusual bleeding or bruising.   Transfusions:  None recently Neuro:  No headaches, no peripheral tingling or numbness Derm:  No itching, no rash or sores.  Endocrine:  No sweats or chills.  No polyuria or dysuria Immunization:  Current on flu shot, Pneumovax and tetanus diphtheria. Travel:  None beyond local counties in last few months.    PHYSICAL EXAM: Vital signs in last 24 hours: Vitals:   11/15/16 2300 11/16/16 0532  BP: 121/60 (!) 126/57  Pulse: 95 89  Resp: 20 19  Temp: 98.2 F (36.8 C) 99 F (37.2 C)   Wt Readings from Last 3 Encounters:  11/16/16 80.2 kg (176 lb 12.9 oz)  11/15/16 79.4 kg (175 lb)  11/13/16 79.3 kg (174 lb 14.4 oz)    General: Uncomfortable, unwell-appearing elderly WF. Doesn't look particularly frail. Head:  No asymmetry or swelling.  Eyes:  Slight scleral icterus Ears:  Not hard of hearing  Nose:  No discharge or congestion Mouth:  Tongue midline, dry mucosa. Neck:  No JVD, no TMG, no masses Lungs:  Diminished but clear bilaterally. No labored breathing or cough. Heart: RRR. No MRG. S1, S2 present Abdomen:  Distended, tight. Small amount of bruising at the surgical incision sites. Exquisitely tender throughout mid and upper abdomen. No guarding or rebound. Bowel sounds scant..   Rectal: Deferred   Musc/Skeltl: No joint erythema, swelling or gross deformity Extremities:  No CCE.  Neurologic:  Alert. Oriented times 3. Moves all 4 limbs. Skin:  No rashes, no obvious jaundice. No telangiectasia. Tattoos:  None observed. Nodes:  No cervical adenopathy.   Psych:  Affect  dull consistent with pain.  Intake/Output from previous day: 01/26 0701 - 01/27 0700 In: 170 [P.O.:120; IV Piggyback:50] Out: 550 [Urine:550] Intake/Output this shift: No intake/output data recorded.  LAB RESULTS:  Recent Labs  11/15/16 0958 11/16/16 0725  WBC 14.4* 12.0*  HGB 12.7* 11.2*  HCT 38.3* 36.3*  PLT 298 246   BMET Lab Results  Component Value Date   NA 139 11/16/2016   NA 136 11/15/2016   NA 139 11/14/2016   K 3.7 11/16/2016   K 3.7 11/15/2016   K 4.0 11/14/2016   CL 104 11/16/2016   CL 97 (L) 11/15/2016   CL 103 11/14/2016   CO2 26 11/16/2016   CO2 28 11/15/2016   CO2 30 11/14/2016   GLUCOSE 133 (H) 11/16/2016   GLUCOSE 183 (H) 11/15/2016   GLUCOSE 195 (H) 11/14/2016   BUN 9 11/16/2016   BUN 8 11/15/2016   BUN 12 11/14/2016   CREATININE 0.89 11/16/2016   CREATININE 0.68 11/15/2016   CREATININE 0.91 11/14/2016   CALCIUM 8.5 (L) 11/16/2016   CALCIUM 9.2 11/15/2016   CALCIUM 8.2 (L) 11/14/2016   LFT  Recent Labs  11/14/16 0455 11/15/16 0958 11/16/16 0725  PROT 5.6* 7.6 5.5*  ALBUMIN 3.0* 3.8 2.2*  AST 179* 162* 37  ALT 212* 247* 115*  ALKPHOS 231* 341* 197*  BILITOT 0.6 3.8* 2.0*   PT/INR Lab Results  Component Value Date   INR 1.37 11/16/2016   INR 1.23 10/20/2016   INR 1.05 10/08/2016   Hepatitis Panel No results for input(s): HEPBSAG, HCVAB, HEPAIGM, HEPBIGM in the last 72 hours. C-Diff No components found for:  CDIFF Lipase     Component Value Date/Time   LIPASE 55 (H) 11/15/2016 9201    Drugs of Abuse  No results found for: LABOPIA, COCAINSCRNUR, LABBENZ, AMPHETMU, THCU, LABBARB   RADIOLOGY STUDIES: US Abdomen Limited Ruq  Result Date: 11/15/2016 CLINICAL DATA:  Abdominal pain EXAM: US ABDOMEN LIMITED - RIGHT UPPER QUADRANT COMPARISON:  CT 1 month 1,016 FINDINGS: Gallbladder: Prior cholecystectomy Common bile duct: Diameter: Dilated, 12 mm. Dilated duct as seen on intraoperative cholangiogram. History new since prior  CT. Liver: Intrahepatic biliary ductal dilatation. Increased echotexture throughout the liver. IMPRESSION: Prior cholecystectomy. Dilated intrahepatic and extrahepatic biliary ducts. Common bile duct dilated to 12 mm. Cannot exclude distal obstructing duct stone. This could be further evaluated with ERCP or MRCP. Fatty liver. Electronically Signed   By: Rolm Baptise M.D.   On: 11/15/2016 12:23     IMPRESSION:   *  Choledocholithiasis.  Patient on Zosyn.  *  History acute cholecystitis treated in mid 09/2016 with percutaneous drainage. Status post laparoscopic cholecystectomy 11/14/15. Intraoperative cholangiogram revealed dilated bile duct and possible choledocholithiasis.   PLAN:     *  ERCP Tomorrow. Obtain CT scan abdomen pelvis with contrast today to rule out biloma. Added lactated Ringer's to run at 1 25 mL/h as anticipate he will not be getting much in the way of by mouth for the next 24 hours. After the CT scan is completed today, I have told the nurse that he can have clear liquids if he wants but he needs to be nothing by mouth after midnight for the ERCP tomorrow  *  As preparation for ERCP and probable sphincterotomy, I have discontinued the Lovenox dose tonight. It can be resumed tomorrow evening. In place I have ordered sequential compression stockings.  Azucena Freed  11/16/2016, 12:10 PM Pager: [phone]  GI ATTENDING  History, laboratory's, x-rays, I see reviewed. Patient personally seen and examined. Wife in room. Agree with comprehensive consultation note as outlined above Transferred to this hospital as noted available GI doctors this weekend at his health facility. Has gallbladder removed there 3 days ago. Radiologist questioned distal bile duct obstruction. LFTs appear that time. Now with pain and elevated LFTs. Abdomen distended and tender on exam. Somewhat uncomfortable but not acutely ill-appearing. White blood cell count mildly elevated. On antibiotics. Agree with  continuing antibiotics, pain management, hydration, and urgent CT scan of the abdomen and pelvis to rule out postoperative complications. If negative, suspect bile duct stone and would proceed with ERCP.The nature of the procedure, as well as the risks, benefits, and alternatives were carefully and thoroughly reviewed with the patient. Ample time for discussion and questions allowed. The patient understood, was satisfied, and agreed to proceed.  Docia Chuck. Geri Seminole., M.D. [facility] Division of Gastroenterology  ADDENDUM CT reviewed with Dr. Rosana Hoes. More upper quadrant and abdominal simple appearing fluid present than typical. Will proceed to HIDA scan and plan ERCP in am. Patient is NPO. Continue antibiotics.  Docia Chuck. Geri Seminole., M.D. [facility] Division of Gastroenterology

## 2016-11-17 NOTE — Anesthesia Postprocedure Evaluation (Signed)
Anesthesia Post Note  Patient: Henry Lindsey  Procedure(s) Performed: Procedure(s) (LRB): ENDOSCOPIC RETROGRADE CHOLANGIOPANCREATOGRAPHY (ERCP) (N/A)  Patient location during evaluation: PACU Anesthesia Type: General Level of consciousness: awake and alert Pain management: pain level controlled Vital Signs Assessment: post-procedure vital signs reviewed and stable Respiratory status: spontaneous breathing, nonlabored ventilation, respiratory function stable and patient connected to nasal cannula oxygen Cardiovascular status: blood pressure returned to baseline and stable Postop Assessment: no signs of nausea or vomiting Anesthetic complications: no       Last Vitals:  Vitals:   11/17/16 1100 11/17/16 1109  BP:  (!) 146/71  Pulse: 89 80  Resp: (!) 22 15  Temp:      Last Pain:  Vitals:   11/17/16 0730  TempSrc: Oral  PainSc:                  Henry Lindsey,W. EDMOND

## 2016-11-17 NOTE — Transfer of Care (Signed)
Immediate Anesthesia Transfer of Care Note  Patient: Henry Lindsey  Procedure(s) Performed: Procedure(s): ENDOSCOPIC RETROGRADE CHOLANGIOPANCREATOGRAPHY (ERCP) (N/A)  Patient Location: PACU  Anesthesia Type:General  Level of Consciousness: awake and patient cooperative  Airway & Oxygen Therapy: Patient Spontanous Breathing and Patient connected to nasal cannula oxygen  Post-op Assessment: Report given to RN and Post -op Vital signs reviewed and stable  Post vital signs: Reviewed and stable  Last Vitals:  Vitals:   11/17/16 0730 11/17/16 1041  BP: (!) 140/50 140/65  Pulse: 85 87  Resp: 16 (!) 22  Temp: 37.2 C     Last Pain:  Vitals:   11/17/16 0730  TempSrc: Oral  PainSc:       Patients Stated Pain Goal: 2 ([id number])  Complications: No apparent anesthesia complications   Awake. Drowsy. Denies pain.  Not oriented

## 2016-11-17 NOTE — Progress Notes (Signed)
[facility] PROGRESS NOTE  Henry Lindsey [id number] DOB: 1933-06-10 DOA: 11/15/2016  PCP: [name], MD  Brief History/Interval Summary: 83 year old Caucasian male with a past medical history of hypertension, non-insulin-dependent diabetes, coronary artery disease, peripheral arterial disease, GERD who had acute cholecystitis on Thanksgiving and had a drain placement initially. He underwent cholecystectomy at [facility] on 1/24. He presented with complains of worsening abdominal pain. He was found to have elevated alkaline phosphatase. Imaging studies raised concern for possible CBD obstruction. He was transferred here for further management due to need for ERCP.  Reason for Visit: Biliary obstruction. Possible cholangitis.  Consultants: Gastroenterology  Procedures:  Recent cholecystectomy on 1/24 at [place]  ERCP 1/28 Impression:                1. Recent cholecystectomy with postoperative bile leak 2. Status post ERCP with sphincterotomy and biliary stent placement 3. 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  Antibiotics: Zosyn  Subjective/Interval History: Patient seen after he underwent ERCP., still drowsy from his anesthesia. His daughter is at the bedside.   ROS: Unable to do today  Objective:  Vital Signs  Vitals:   11/17/16 1100 11/17/16 1109 11/17/16 1141 11/17/16 1244  BP:  (!) 146/71 (!) 158/68 (!) 152/66  Pulse: 89 80 85 88  Resp: (!) 22 15    Temp:   97.7 F (36.5 C) 99 F (37.2 C)  TempSrc:   Oral Oral  SpO2: 93% 94% 98% 92%  Weight:        Intake/Output Summary (Last 24 hours) at 11/17/16 1313 Last data filed at 11/17/16 1248  Gross per 24 hour  Intake          2185.42 ml  Output             1255 ml  Net           930.42 ml   Filed Weights   11/16/16 0532 11/17/16 0437  Weight: 80.2 kg (176 lb 12.9 oz) 79.7 kg (175 lb 10.6 oz)    General appearance: Drowsy from anesthesia. Does wake up.  Resp:  Diminished air entry at the bases. No wheezing, no rhonchi. Few crackles. Normal effort. Cardio: regular rate and rhythm, S1, S2 normal, no murmur, click, rub or gallop GI: Incision sites look clean. Abdomen is diffusely tender mainly in the right upper quadrant and epigastric area. No masses or organomegaly. No rebound, rigidity or guarding. Bowel sounds present. Extremities: extremities normal, atraumatic, no cyanosis or edema Neurologic: Drowsy due to anesthesia.   Lab Results:  Data Reviewed: I have personally reviewed following labs and imaging studies  CBC:  Recent Labs Lab 11/15/16 0958 11/16/16 0725 11/17/16 0523  WBC 14.4* 12.0* 9.1  HGB 12.7* 11.2* 10.3*  HCT 38.3* 36.3* 33.6*  MCV 82.8 86.4 85.7  PLT 298 246 [id number]    Basic Metabolic Panel:  Recent Labs Lab 11/13/16 1806 11/14/16 0455 11/15/16 0958 11/16/16 0725 11/17/16 0523  NA 137 139 136 139 135  K 3.7 4.0 3.7 3.7 3.4*  CL 102 103 97* 104 102  CO2 28 30 28 26 25   GLUCOSE 236* 195* 183* 133* 122*  BUN 15 12 8 9 11   CREATININE 0.99 0.91 0.68 0.89 0.97  CALCIUM 8.0* 8.2* 9.2 8.5* 8.4*    GFR: Estimated Creatinine Clearance: 63.6 mL/min (by C-G formula based on SCr of 0.97 mg/dL).  Liver Function Tests:  Recent Labs Lab 11/13/16 1806 11/14/16 0455 11/15/16 [id number] 11/16/16 0725  11/17/16 0523  AST 296* 179* 162* 37 24  ALT 276* 212* 247* 115* 75*  ALKPHOS 277* 231* 341* 197* 160*  BILITOT 0.8 0.6 3.8* 2.0* 1.7*  PROT 6.2* 5.6* 7.6 5.5* 5.4*  ALBUMIN 3.5 3.0* 3.8 2.2* 2.1*     Recent Labs Lab 11/15/16 0958  LIPASE 55*    Coagulation Profile:  Recent Labs Lab 11/16/16 0725  INR 1.37    Cardiac Enzymes:  Recent Labs Lab 11/15/16 0958  TROPONINI <0.03    CBG:  Recent Labs Lab 11/17/16 0428 11/17/16 0735 11/17/16 1045 11/17/16 1141 11/17/16 1235  GLUCAP 131* 106* 133* 126* 129*    Radiology Studies: Nm Hepatobiliary Liver Func  Result Date: 11/17/2016 CLINICAL DATA:   83 year old male status post cholecystectomy. Evaluate for bile leak. EXAM: NUCLEAR MEDICINE HEPATOBILIARY IMAGING TECHNIQUE: Sequential images of the abdomen were obtained out to 60 minutes following intravenous administration of radiopharmaceutical. RADIOPHARMACEUTICALS:  5.15 mCi Tc-34m  Choletec IV COMPARISON:  Abdominal CT dated 11/16/2016 FINDINGS: Prompt uptake and biliary excretion of activity by the liver is seen. There is accumulation of radiotracer outside of the biliary tree corresponding to the fluid collection seen on the CT within the gallbladder fossa as well as sacculation of the radiopharmaceutical within the fluid collection along the inferior surface of the left lobe of the liver. Findings most consistent with contained bite leak or biloma. No extension of radiotracer noted inferiorly within the abdomen. IMPRESSION: Extra-biliary collection of radiopharmaceutical in the gallbladder fossa and along the inferior surface of the left lobe of the liver corresponding to the fluid collections seen on the CT and most consistent with contained bite leak/biloma. Electronically Signed   By: Anner Crete M.D.   On: 11/17/2016 00:41   Ct Abdomen Pelvis W Contrast  Result Date: 11/16/2016 CLINICAL DATA:  Patient with recent cholecystectomy 3 days prior. Dilated intrahepatic and extrahepatic bile ducts. Evaluate for biloma. EXAM: CT ABDOMEN AND PELVIS WITH CONTRAST TECHNIQUE: Multidetector CT imaging of the abdomen and pelvis was performed using the standard protocol following bolus administration of intravenous contrast. CONTRAST:  119mL ISOVUE-300 IOPAMIDOL (ISOVUE-300) INJECTION 61% COMPARISON:  CT abdomen pelvis 10/21/2016. FINDINGS: Lower chest: Normal heart size. Coronary arterial vascular calcifications. Small layering bilateral pleural effusions. Subpleural ground-glass and consolidative opacities within the lower lobes bilaterally. Hepatobiliary: Liver is normal in size and contour.  Postsurgical changes compatible with interval cholecystectomy. There is flocculent fluid demonstrated within the gallbladder fossa. There are a few small foci of gas within the gallbladder fossa. Additionally, there is fluid extending from the gallbladder fossa into the gastrohepatic ligament and about the liver. Additionally fluid extends within the left upper quadrant around the spleen. Mild intrahepatic and extrahepatic biliary ductal dilatation. Pancreas: Mild pancreatic atrophy. Spleen: Unremarkable. Adrenals/Urinary Tract: The adrenal glands are normal. 1.5 cm cyst superior pole right kidney, 1.5 cm cyst inferior pole right kidney and 1.9 cm cyst interpolar region left kidney. There is a 1.5 cm cyst within the superior pole left kidney. Urinary bladder is unremarkable. Stomach/Bowel: Sigmoid colonic diverticulosis. Small amount of free fluid in the pelvis. Free intraperitoneal air within the upper abdomen. Normal appendix. There a few mildly distended loops of small bowel within the central abdomen. Normal morphology of the stomach. Vascular/Lymphatic: Peripheral calcified atherosclerotic plaque. Retroaortic left renal vein. No retroperitoneal lymphadenopathy. Reproductive: Prostate unremarkable. Other: Small bilateral fat containing inguinal hernias, left-greater-than-right. Musculoskeletal: Multilevel degenerative disc disease. Grade 1 anterolisthesis L4 on L5. IMPRESSION: Postsurgical changes compatible with interval cholecystectomy. Flocculent fluid within  the gallbladder fossa likely secondary to postsurgical change/ surgical material. Recommend correlation with operative history. Additionally, there is fluid extending from the cholecystectomy site into the central abdomen and left upper quadrant which is nonspecific however may be secondary to a bile leak. Recommend correlation with HIDA scan. Small amount of free intraperitoneal air within the upper abdomen likely reflective of recent postoperative  state. Mild intrahepatic and extrahepatic biliary ductal dilatation. Aortic atherosclerosis. Mild distention of the small bowel centrally likely secondary to ileus. Small layering bilateral pleural effusions with underlying ground-glass consolidative opacities favored to represent atelectasis. Infection not excluded. These results will be called to the ordering clinician or representative by the Radiologist Assistant, and communication documented in the PACS or zVision Dashboard. Electronically Signed   By: Lovey Newcomer M.D.   On: 11/16/2016 20:04     Medications:  Scheduled: . atorvastatin  40 mg Oral q1800  . cholecalciferol  2,000 Units Oral Daily  . enoxaparin (LOVENOX) injection  40 mg Subcutaneous QHS  . famotidine (PEPCID) IV  20 mg Intravenous Q12H  . feeding supplement (ENSURE ENLIVE)  237 mL Oral BID  . fentaNYL      . gabapentin  400 mg Oral TID  . insulin aspart  0-9 Units Subcutaneous Q4H  . insulin glargine  8 Units Subcutaneous QHS  . latanoprost  1 drop Both Eyes QHS  . metoprolol succinate  100 mg Oral Daily  . multivitamin with minerals  1 tablet Oral Daily  . pantoprazole  40 mg Oral BID  . piperacillin-tazobactam (ZOSYN)  IV  3.375 g Intravenous Q8H  . senna  1 tablet Oral BID  . tamsulosin  0.4 mg Oral QPC breakfast   Continuous: . lactated ringers 1 mL (11/17/16 0246)   [id number] **OR** acetaminophen, bisacodyl, fentaNYL (SUBLIMAZE) injection, HYDROmorphone (DILAUDID) injection, magic mouthwash, ondansetron (ZOFRAN) IV, polyethylene glycol  Assessment/Plan:  Principal Problem:   Biliary obstruction Active Problems:   Hypertension   Type 2 diabetes, uncontrolled, with peripheral circulatory disorder (HCC)   CAD (coronary artery disease)   Bile leak, postoperative    Biliary obstruction/concern for cholangitis Patient status post cholecystectomy on 1/24 at [place]. Presented with abdominal pain and found to have elevated alkaline phosphatase and  bilirubin. Transferred to [facility] for further workup. Seen by gastroenterology. Underwent ERCP and underwent sphincterotomy and biliary stent placement. Also noted to have high-grade duodenal stenosis. Bilirubin is improved this morning. Continue Zosyn. WBC has improved. To consult general surgery and interventional radiology regarding fluid collection seen on imaging studies.   Duodenal stenosis Noted on ERCP. Management per gastroenterology.Patient's daughter does mention episodes of nausea and vomiting previously, which was thought to be related to chemotherapy.  History of CAD, PAD  Seems to be stable. Continue home medications. Aspirin and cilostazol held for endoscopy.  Insulin-dependent DM  A1c was 7.6% in December 2017. Managed at home with metformin, Januvia, glipizide, and Humalog 75/25 6 units qAM and 8 units qPM; these are held on admission. Continue to monitor CBGs. Continue Lantus.  Essential Hypertension  Monitor blood pressures. Continue with Toprol.   DVT Prophylaxis: Lovenox    Code Status: Full code  Family Communication: Discussed with the patient and his daughter  Disposition Plan: Management as outlined above.    LOS: 2 days   [facility] Pager [phone] 11/17/2016, 1:13 PM  If 7PM-7AM, please contact night-coverage at [email or web address], password [facility]

## 2016-11-17 NOTE — Op Note (Signed)
[facility] Patient Name: Henry Lindsey Procedure Date : 11/17/2016 MRN: [id number] Attending MD: Docia Chuck. Henrene Pastor , MD Date of Birth: Nov 28, 1932 CSN: [id number] Age: 83 Admit Type: Outpatient Procedure:                ERCP, with biliary sphincterotomy and biliary stent                            placement Indications:              Bile leak by HIDA scan, elevated liver tests,                            biliary dilation on other imaging. Status post                            laparoscopic cholecystectomy elsewhere several days                            ago. Presents with abdominal pain and elevated                            liver tests in transfer Providers:                Docia Chuck. Henrene Pastor, MD, Elna Breslow, RN, Cherylynn Ridges, Technician Referring MD:             [facility] Medicines:                General Anesthesia Complications:            No immediate complications. Estimated Blood Loss:     Estimated blood loss: none. Procedure:                Pre-Anesthesia Assessment:                           - Prior to the procedure, a History and Physical                            was performed, and patient medications and                            allergies were reviewed. The patient's tolerance of                            previous anesthesia was also reviewed. The risks                            and benefits of the procedure and the sedation                            options and risks were discussed with the patient.  All questions were answered, and informed consent                            was obtained. Prior Anticoagulants: The patient has                            taken no previous anticoagulant or antiplatelet                            agents. ASA Grade Assessment: III - A patient with                            severe systemic disease. After reviewing the risks                            and benefits,  the patient was deemed in                            satisfactory condition to undergo the procedure.                           After obtaining informed consent, the scope was                            passed under direct vision. Throughout the                            procedure, the patient's blood pressure, pulse, and                            oxygen saturations were monitored continuously. The                            [phone]) scope was introduced through                            the mouth, and used to inject contrast into and                            used to inject contrast into the bile duct and                            ventral pancreatic duct. The ERCP was technically                            difficult and complex due to challenging                            cannulation because of abnormal anatomy (described                            below). The patient tolerated the procedure well. Scope In: Scope Out: Findings:      ENDOSCOPIC: Esophagus was not examined. Stomach  was unremarkable. There       was a high-grade benign appearing peptic stricture between the duodenal       bulb and second portion. With difficulty the scope was passed beyond.       Major ampulla was identified but proper positioning of the side-viewing       scope was extremely difficult due to the presence of the duodenal       stricture. It Was difficult to approach the papilla from below in either       the short or long scope positions.      X-ray:A scout film of the abdomen was obtained. Surgical clips,       consistent with a previous cholecystectomy, were seen in the area of the       right upper quadrant of the abdomen. Initial injection of contrast       yielded unremarkable pancreatogram. The cannula was eventually       positioned such that a cholangiogram could be obtained. The bile duct       was then accessed with a wire. The bile duct was deeply cannulated with       the  traction (standard) sphincterotome. Contrast was injected. I       personally interpreted the bile duct images. Opacification of the entire       biliary tree was successful. The entire biliary tree was diffusely       dilated. There were no obvious filling defects. An obvious bile leak was       not readily appreciated. Biliary sphincterotomy was made with a traction       (standard) sphincterotome using ERBE electrocautery. There was no       post-sphincterotomy bleeding. One 10 Fr by 5 cm plastic stent with a       single external flap and a single internal flap was placed into the       common bile duct. Bile flowed through the stent. The stent was in good       position. Impression:               1. Recent cholecystectomy with postoperative bile                            leak                           2. Status post ERCP with sphincterotomy and biliary                            stent placement                           3. High-grade duodenal stenosis likely peptic in                            nature. Recommendation:           1. Standard post ERCP observation                           2. Indomethacin rectal suppositories                           3.  Continue antibiotics                           4. Consult with general surgery and interventional                            radiology. Thus the patient need percutaneous drain                            placement are not?.                           5. GI will follow Procedure Code(s):        --- Professional ---                           [phone], Endoscopic retrograde                            cholangiopancreatography (ERCP); with placement of                            endoscopic stent into biliary or pancreatic duct,                            including pre- and post-dilation and guide wire                            passage, when performed, including sphincterotomy,                            when performed, each stent Diagnosis  Code(s):        --- Professional ---                           K83.8, Other specified diseases of biliary tract CPT copyright 2016 American Medical Association. All rights reserved. The codes documented in this report are preliminary and upon coder review may  be revised to meet current compliance requirements. Docia Chuck. Henrene Pastor, MD 11/17/2016 10:48:50 AM This report has been signed electronically. Number of Addenda: 0

## 2016-11-17 NOTE — Anesthesia Preprocedure Evaluation (Addendum)
Anesthesia Evaluation  Patient identified by MRN, date of birth, ID band Patient awake    Reviewed: Allergy & Precautions, H&P , NPO status , Patient's Chart, lab work & pertinent test results  Airway Mallampati: II  TM Distance: >3 FB Neck ROM: Full    Dental no notable dental hx. (+) Teeth Intact, Dental Advisory Given   Pulmonary neg pulmonary ROS, former smoker,    Pulmonary exam normal breath sounds clear to auscultation       Cardiovascular hypertension, Pt. on medications + CAD and + Peripheral Vascular Disease   Rhythm:Regular Rate:Normal     Neuro/Psych negative neurological ROS  negative psych ROS   GI/Hepatic Neg liver ROS, GERD  Medicated and Controlled,  Endo/Other  diabetes, Insulin Dependent  Renal/GU negative Renal ROS  negative genitourinary   Musculoskeletal  (+) Arthritis , Osteoarthritis,    Abdominal   Peds  Hematology negative hematology ROS (+)   Anesthesia Other Findings   Reproductive/Obstetrics negative OB ROS                            Anesthesia Physical Anesthesia Plan  ASA: III  Anesthesia Plan: General   Post-op Pain Management:    Induction: Intravenous  Airway Management Planned: Oral ETT  Additional Equipment:   Intra-op Plan:   Post-operative Plan: Extubation in OR  Informed Consent: I have reviewed the patients History and Physical, chart, labs and discussed the procedure including the risks, benefits and alternatives for the proposed anesthesia with the patient or authorized representative who has indicated his/her understanding and acceptance.   Dental advisory given  Plan Discussed with: CRNA  Anesthesia Plan Comments:         Anesthesia Quick Evaluation

## 2016-11-17 NOTE — Interval H&P Note (Signed)
History and Physical Interval Note:  11/17/2016 6:55 AM  Henry Lindsey  has presented today for surgery, with the diagnosis of dilated bile duct, suspect choledocholithiasis, BILE LEAK POST-OP  The various methods of treatment have been discussed with the patient and family. After consideration of risks, benefits and other options for treatment, the patient has consented to  Procedure(s): ENDOSCOPIC RETROGRADE CHOLANGIOPANCREATOGRAPHY (ERCP) (N/A) as a surgical intervention .  The patient's history has been reviewed, patient examined, no change in status, stable for surgery.  I have reviewed the patient's chart and labs.  Questions were answered to the patient's satisfaction.     Scarlette Shorts

## 2016-11-17 NOTE — Anesthesia Procedure Notes (Signed)
Procedure Name: Intubation Date/Time: 11/17/2016 8:13 AM Performed by: Oletta Lamas Pre-anesthesia Checklist: Patient identified, Emergency Drugs available, Suction available and Patient being monitored Patient Re-evaluated:Patient Re-evaluated prior to inductionOxygen Delivery Method: Circle System Utilized Preoxygenation: Pre-oxygenation with 100% oxygen Intubation Type: IV induction Ventilation: Mask ventilation without difficulty Laryngoscope Size: Mac and 3 Grade View: Grade I Tube type: Oral Tube size: 7.5 mm Number of attempts: 1 Airway Equipment and Method: Stylet and Oral airway Placement Confirmation: ETT inserted through vocal cords under direct vision,  positive ETCO2 and breath sounds checked- equal and bilateral Secured at: 23 cm Tube secured with: Tape Dental Injury: Teeth and Oropharynx as per pre-operative assessment

## 2016-11-18 ENCOUNTER — Encounter (HOSPITAL_COMMUNITY): Payer: Self-pay | Admitting: Internal Medicine

## 2016-11-18 DIAGNOSIS — E440 Moderate protein-calorie malnutrition: Secondary | ICD-10-CM | POA: Insufficient documentation

## 2016-11-18 DIAGNOSIS — R945 Abnormal results of liver function studies: Secondary | ICD-10-CM

## 2016-11-18 DIAGNOSIS — R7989 Other specified abnormal findings of blood chemistry: Secondary | ICD-10-CM

## 2016-11-18 LAB — COMPREHENSIVE METABOLIC PANEL
ALK PHOS: 145 U/L — AB (ref 38–126)
ALT: 50 U/L (ref 17–63)
ANION GAP: 6 (ref 5–15)
AST: 20 U/L (ref 15–41)
Albumin: 1.9 g/dL — ABNORMAL LOW (ref 3.5–5.0)
BUN: 12 mg/dL (ref 6–20)
CALCIUM: 8.1 mg/dL — AB (ref 8.9–10.3)
CHLORIDE: 106 mmol/L (ref 101–111)
CO2: 27 mmol/L (ref 22–32)
Creatinine, Ser: 0.95 mg/dL (ref 0.61–1.24)
GFR calc non Af Amer: >60 mL/min (ref >60)
Glucose, Bld: 130 mg/dL — ABNORMAL HIGH (ref 65–99)
Potassium: 3.5 mmol/L (ref 3.5–5.1)
Sodium: 139 mmol/L (ref 135–145)
Total Bilirubin: 1.7 mg/dL — ABNORMAL HIGH (ref 0.3–1.2)
Total Protein: 4.9 g/dL — ABNORMAL LOW (ref 6.5–8.1)

## 2016-11-18 LAB — CBC
HCT: 31.8 % — ABNORMAL LOW (ref 39.0–52.0)
Hemoglobin: 9.8 g/dL — ABNORMAL LOW (ref 13.0–17.0)
MCH: 26.1 pg (ref 26.0–34.0)
MCHC: 30.8 g/dL (ref 30.0–36.0)
MCV: 84.6 fL (ref 78.0–100.0)
PLATELETS: 229 10*3/uL (ref 150–400)
RBC: 3.76 MIL/uL — ABNORMAL LOW (ref 4.22–5.81)
RDW: 14.4 % (ref 11.5–15.5)
WBC: 7.2 10*3/uL (ref 4.0–10.5)

## 2016-11-18 LAB — GLUCOSE, CAPILLARY
GLUCOSE-CAPILLARY: 157 mg/dL — AB (ref 65–99)
GLUCOSE-CAPILLARY: 203 mg/dL — AB (ref 65–99)
GLUCOSE-CAPILLARY: 208 mg/dL — AB (ref 65–99)
GLUCOSE-CAPILLARY: 224 mg/dL — AB (ref 65–99)
Glucose-Capillary: 126 mg/dL — ABNORMAL HIGH (ref 65–99)
Glucose-Capillary: 208 mg/dL — ABNORMAL HIGH (ref 65–99)

## 2016-11-18 MED ORDER — OXYCODONE-ACETAMINOPHEN 5-325 MG PO TABS
1.0000 | ORAL_TABLET | ORAL | Status: DC | PRN
  Administered 2016-11-18 – 2016-11-24 (×20): 1 via ORAL
  Filled 2016-11-18 (×24): qty 1

## 2016-11-18 MED FILL — Fentanyl Citrate Preservative Free (PF) Inj 100 MCG/2ML: INTRAMUSCULAR | Qty: 2 | Status: AC

## 2016-11-18 NOTE — Evaluation (Signed)
Physical Therapy Evaluation Patient Details Name: Henry Lindsey MRN: [id number] DOB: 09/22/1933 Today's Date: 11/18/2016   History of Present Illness  Esaul initially had cholecystitis last Thanksgiving. Due to his medical issues, a perc chole drain was placed. He improved medically and underwent lap chole 1/24 by Dr. Hampton Abbot at [facility]. He was discharged from their 1/25. He returned to the emergency room there 1/26 complaining of abdominal pain. Evaluation was concerning for postoperative bile leak. He was transferred to the hospitalist service at [facility]. He underwent hida scan confirming bile leak. He underwent ERCP with stent placement today by Dr. Scarlette Shorts.  Clinical Impression  Pt admitted with above diagnosis. Pt currently with functional limitations due to the deficits listed below (see PT Problem List). Pt with slightly unsteady gait but is able to ambulate with min guard assist with ability to self correct.  Does report he is weaker than baseline.  Has RW and cane he can use prn at home.  Do not feel he will need [place] f/u as his girlfriend can assist per pt.  Will follow acutely. Pt will benefit from skilled PT to increase their independence and safety with mobility to allow discharge to the venue listed below.      Follow Up Recommendations No PT follow up;Supervision/Assistance - 24 hour    Equipment Recommendations  None recommended by PT    Recommendations for Other Services       Precautions / Restrictions Precautions Precautions: Fall Restrictions Weight Bearing Restrictions: No      Mobility  Bed Mobility Overal bed mobility: Independent                Transfers Overall transfer level: Independent                  Ambulation/Gait Ambulation/Gait assistance: Min guard Ambulation Distance (Feet): 200 Feet Assistive device: 1 person hand held assist;None Gait Pattern/deviations: Step-through pattern;Decreased stride length;Drifts right/left   Gait  velocity interpretation: Below normal speed for age/gender General Gait Details: Pt was able to ambulate without UE support but did bsst with one UE support as he is grossly unsteady.  Pt was able to self correct balance when drifting.    Stairs Stairs: Yes Stairs assistance: Min guard Stair Management: One rail Right;Step to pattern;Forwards Number of Stairs: 7 General stair comments: Pt needed min guard assist to ascend and descend steps today.   Wheelchair Mobility    Modified Rankin (Stroke Patients Only)       Balance Overall balance assessment: Needs assistance Sitting-balance support: No upper extremity supported;Feet supported Sitting balance-Leahy Scale: Good   Postural control: Posterior lean Standing balance support: No upper extremity supported;During functional activity;Single extremity supported Standing balance-Leahy Scale: Poor Standing balance comment: Pt somewhat reliant on one UE support for balance.  Has a posterior lean but appears to be the way he postures at baseline per pt.              High level balance activites: Direction changes;Turns;Sudden stops High Level Balance Comments: min guard assist with above.              Pertinent Vitals/Pain Pain Assessment: Faces Faces Pain Scale: Hurts a little bit Pain Location: abdomen incision site Pain Descriptors / Indicators: Operative site guarding Pain Intervention(s): Limited activity within patient's tolerance;Monitored during session;Repositioned  VSS  Home Living Family/patient expects to be discharged to:: Private residence Living Arrangements: Spouse/significant other;Children Available Help at Discharge: Friend(s) Type of Home: House Home Access:  Stairs to enter Entrance Stairs-Rails: None Entrance Stairs-Number of Steps: 4 Home Layout: Two level;Bed/bath upstairs;Full bath on main level Home Equipment: Walker - 2 wheels;Cane - single point;Grab bars - tub/shower      Prior Function  Level of Independence: Independent               Hand Dominance        Extremity/Trunk Assessment   Upper Extremity Assessment Upper Extremity Assessment: Defer to OT evaluation    Lower Extremity Assessment Lower Extremity Assessment: Generalized weakness    Cervical / Trunk Assessment Cervical / Trunk Assessment: Other exceptions (pt states "football back" but appears to be hunchback)  Communication   Communication: No difficulties  Cognition Arousal/Alertness: Awake/alert Behavior During Therapy: WFL for tasks assessed/performed Overall Cognitive Status: Within Functional Limits for tasks assessed                      General Comments      Exercises     Assessment/Plan    PT Assessment Patient needs continued PT services  PT Problem List Decreased mobility;Decreased balance;Decreased activity tolerance;Decreased strength;Decreased safety awareness          PT Treatment Interventions DME instruction;Gait training;Stair training;Functional mobility training;Therapeutic activities;Therapeutic exercise;Balance training;Patient/family education    PT Goals (Current goals can be found in the Care Plan section)  Acute Rehab PT Goals Patient Stated Goal: to go home PT Goal Formulation: With patient Time For Goal Achievement: 11/25/16 Potential to Achieve Goals: Good    Frequency Min 3X/week   Barriers to discharge        Co-evaluation               End of Session Equipment Utilized During Treatment: Gait belt Activity Tolerance: Patient limited by fatigue Patient left: in chair;with call bell/phone within reach Nurse Communication: Mobility status         Time: [id number] PT Time Calculation (min) (ACUTE ONLY): 16 min   Charges:   PT Evaluation $PT Eval Moderate Complexity: 1 Procedure     PT G CodesGodfrey Pick Lonald Troiani Dec 04, 2016, 10:24 AM [facility] [phone] (pager)

## 2016-11-18 NOTE — Progress Notes (Signed)
[facility] PROGRESS NOTE  Henry Lindsey [id number] DOB: [birth year] DOA: 11/15/2016  PCP: [name], MD  Brief History/Interval Summary: 83 year old Caucasian male with a past medical history of hypertension, non-insulin-dependent diabetes, coronary artery disease, peripheral arterial disease, GERD who had acute cholecystitis on Thanksgiving and had a drain placement initially. He underwent cholecystectomy at [facility] on 1/24. He presented with complains of worsening abdominal pain. He was found to have elevated alkaline phosphatase. Imaging studies raised concern for possible CBD obstruction. He was transferred here and was seen by gastroenterology. Underwent ERCP with stent placement.  Reason for Visit: Biliary obstruction. Possible cholangitis.  Consultants: Gastroenterology. General surgery.  Procedures:  Recent cholecystectomy on 1/24 at [place]  ERCP 1/28 Impression:                1. Recent cholecystectomy with postoperative bile leak 2. Status post ERCP with sphincterotomy and biliary stent placement 3. 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  Antibiotics: Zosyn  Subjective/Interval History: Patient states that he is feeling much better. Abdominal pain has significantly improved. Denies any nausea or vomiting. His daughter and son are at the bedside.   ROS: Denies any chest pain or shortness of breath.  Objective:  Vital Signs  Vitals:   11/17/16 1244 11/17/16 1828 11/17/16 2009 11/18/16 0511  BP: (!) 152/66 135/67 (!) 125/58 132/60  Pulse: 88 80 73 73  Resp:  16 17 17   Temp: 99 F (37.2 C) 98.1 F (36.7 C) 97.7 F (36.5 C) 97.8 F (36.6 C)  TempSrc: Oral Oral Oral Oral  SpO2: 92% 97% 96% 94%  Weight:    78.3 kg (172 lb 11.3 oz)    Intake/Output Summary (Last 24 hours) at 11/18/16 0733 Last data filed at 11/18/16 0512  Gross per 24 hour  Intake             1400 ml  Output             1531 ml  Net              -131 ml   Filed Weights   11/16/16 0532 11/17/16 0437 11/18/16 0511  Weight: 80.2 kg (176 lb 12.9 oz) 79.7 kg (175 lb 10.6 oz) 78.3 kg (172 lb 11.3 oz)    General appearance: Awake, alert. No distress.  Resp: Good air entry bilaterally. No wheezing, rales or rhonchi. Normal effort.  Cardio: regular rate and rhythm, S1, S2 normal, no murmur, click, rub or gallop GI: Incision sites look clean. Abdomen is soft. Very mildly tender today. Much improved compared to yesterday. No masses or organomegaly. No rebound, rigidity or guarding. Bowel sounds present. Extremities: extremities normal, atraumatic, no cyanosis or edema Neurologic: Awake and alert. Oriented 3. No focal neurological deficits.   Lab Results:  Data Reviewed: I have personally reviewed following labs and imaging studies  CBC:  Recent Labs Lab 11/15/16 0958 11/16/16 0725 11/17/16 0523 11/18/16 0523  WBC 14.4* 12.0* 9.1 7.2  HGB 12.7* 11.2* 10.3* 9.8*  HCT 38.3* 36.3* 33.6* 31.8*  MCV 82.8 86.4 85.7 84.6  PLT 298 246 244 [id number]    Basic Metabolic Panel:  Recent Labs Lab 11/14/16 0455 11/15/16 0958 11/16/16 0725 11/17/16 0523 11/18/16 0523  NA 139 136 139 135 139  K 4.0 3.7 3.7 3.4* 3.5  CL 103 97* 104 102 106  CO2 30 28 26 25 27   GLUCOSE 195* 183* 133* 122* 130*  BUN 12 8 9 11 12   CREATININE  0.91 0.68 0.89 0.97 0.95  CALCIUM 8.2* 9.2 8.5* 8.4* 8.1*    GFR: Estimated Creatinine Clearance: 65 mL/min (by C-G formula based on SCr of 0.95 mg/dL).  Liver Function Tests:  Recent Labs Lab 11/14/16 0455 11/15/16 0958 11/16/16 0725 11/17/16 0523 11/18/16 0523  AST 179* 162* 37 24 20  ALT 212* 247* 115* 75* 50  ALKPHOS 231* 341* 197* 160* 145*  BILITOT 0.6 3.8* 2.0* 1.7* 1.7*  PROT 5.6* 7.6 5.5* 5.4* 4.9*  ALBUMIN 3.0* 3.8 2.2* 2.1* 1.9*     Recent Labs Lab 11/15/16 0958  LIPASE 55*    Coagulation Profile:  Recent Labs Lab 11/16/16 0725  INR 1.37    Cardiac Enzymes:  Recent  Labs Lab 11/15/16 0958  TROPONINI <0.03    CBG:  Recent Labs Lab 11/17/16 1235 11/17/16 1655 11/17/16 2012 11/18/16 0002 11/18/16 0509  GLUCAP 129* 204* 245* 168* 126*    Radiology Studies: Nm Hepatobiliary Liver Func  Result Date: 11/17/2016 CLINICAL DATA:  83 year old male status post cholecystectomy. Evaluate for bile leak. EXAM: NUCLEAR MEDICINE HEPATOBILIARY IMAGING TECHNIQUE: Sequential images of the abdomen were obtained out to 60 minutes following intravenous administration of radiopharmaceutical. RADIOPHARMACEUTICALS:  5.15 mCi Tc-46m  Choletec IV COMPARISON:  Abdominal CT dated 11/16/2016 FINDINGS: Prompt uptake and biliary excretion of activity by the liver is seen. There is accumulation of radiotracer outside of the biliary tree corresponding to the fluid collection seen on the CT within the gallbladder fossa as well as sacculation of the radiopharmaceutical within the fluid collection along the inferior surface of the left lobe of the liver. Findings most consistent with contained bite leak or biloma. No extension of radiotracer noted inferiorly within the abdomen. IMPRESSION: Extra-biliary collection of radiopharmaceutical in the gallbladder fossa and along the inferior surface of the left lobe of the liver corresponding to the fluid collections seen on the CT and most consistent with contained bite leak/biloma. Electronically Signed   By: Anner Crete M.D.   On: 11/17/2016 00:41   Ct Abdomen Pelvis W Contrast  Result Date: 11/16/2016 CLINICAL DATA:  Patient with recent cholecystectomy 3 days prior. Dilated intrahepatic and extrahepatic bile ducts. Evaluate for biloma. EXAM: CT ABDOMEN AND PELVIS WITH CONTRAST TECHNIQUE: Multidetector CT imaging of the abdomen and pelvis was performed using the standard protocol following bolus administration of intravenous contrast. CONTRAST:  151mL ISOVUE-300 IOPAMIDOL (ISOVUE-300) INJECTION 61% COMPARISON:  CT abdomen pelvis 10/21/2016.  FINDINGS: Lower chest: Normal heart size. Coronary arterial vascular calcifications. Small layering bilateral pleural effusions. Subpleural ground-glass and consolidative opacities within the lower lobes bilaterally. Hepatobiliary: Liver is normal in size and contour. Postsurgical changes compatible with interval cholecystectomy. There is flocculent fluid demonstrated within the gallbladder fossa. There are a few small foci of gas within the gallbladder fossa. Additionally, there is fluid extending from the gallbladder fossa into the gastrohepatic ligament and about the liver. Additionally fluid extends within the left upper quadrant around the spleen. Mild intrahepatic and extrahepatic biliary ductal dilatation. Pancreas: Mild pancreatic atrophy. Spleen: Unremarkable. Adrenals/Urinary Tract: The adrenal glands are normal. 1.5 cm cyst superior pole right kidney, 1.5 cm cyst inferior pole right kidney and 1.9 cm cyst interpolar region left kidney. There is a 1.5 cm cyst within the superior pole left kidney. Urinary bladder is unremarkable. Stomach/Bowel: Sigmoid colonic diverticulosis. Small amount of free fluid in the pelvis. Free intraperitoneal air within the upper abdomen. Normal appendix. There a few mildly distended loops of small bowel within the central abdomen. Normal morphology of  the stomach. Vascular/Lymphatic: Peripheral calcified atherosclerotic plaque. Retroaortic left renal vein. No retroperitoneal lymphadenopathy. Reproductive: Prostate unremarkable. Other: Small bilateral fat containing inguinal hernias, left-greater-than-right. Musculoskeletal: Multilevel degenerative disc disease. Grade 1 anterolisthesis L4 on L5. IMPRESSION: Postsurgical changes compatible with interval cholecystectomy. Flocculent fluid within the gallbladder fossa likely secondary to postsurgical change/ surgical material. Recommend correlation with operative history. Additionally, there is fluid extending from the  cholecystectomy site into the central abdomen and left upper quadrant which is nonspecific however may be secondary to a bile leak. Recommend correlation with HIDA scan. Small amount of free intraperitoneal air within the upper abdomen likely reflective of recent postoperative state. Mild intrahepatic and extrahepatic biliary ductal dilatation. Aortic atherosclerosis. Mild distention of the small bowel centrally likely secondary to ileus. Small layering bilateral pleural effusions with underlying ground-glass consolidative opacities favored to represent atelectasis. Infection not excluded. These results will be called to the ordering clinician or representative by the Radiologist Assistant, and communication documented in the PACS or zVision Dashboard. Electronically Signed   By: Lovey Newcomer M.D.   On: 11/16/2016 20:04   Dg Ercp Biliary & Pancreatic Ducts  Result Date: 11/17/2016 CLINICAL DATA:  Bile leak with stent placement and sphincterotomy. EXAM: ERCP TECHNIQUE: Multiple spot images obtained with the fluoroscopic device and submitted for interpretation post-procedure. FLUOROSCOPY TIME:  Fluoroscopy Time:  19 minutes and 42 seconds Number of Acquired Spot Images: 8 COMPARISON:  Abdominal CT 11/16/2016. Hepatobiliary examination 11/16/2016 FINDINGS: First set of images demonstrate cannulation and opacification of the main pancreatic duct. The biliary system was cannulated and opacified with contrast. Final image demonstrates a catheter or stent in the common bile duct. Limited evaluation for contrast extravasation. IMPRESSION: Cannulation and opacification of the biliary system for sphincterotomy and stent placement. These images were submitted for radiologic interpretation only. Please see the procedural report for the amount of contrast and the fluoroscopy time utilized. Electronically Signed   By: Markus Daft M.D.   On: 11/17/2016 14:59     Medications:  Scheduled: . atorvastatin  40 mg Oral q1800  .  cholecalciferol  2,000 Units Oral Daily  . enoxaparin (LOVENOX) injection  40 mg Subcutaneous QHS  . famotidine (PEPCID) IV  20 mg Intravenous Q12H  . feeding supplement (ENSURE ENLIVE)  237 mL Oral BID  . gabapentin  400 mg Oral TID  . insulin aspart  0-9 Units Subcutaneous Q4H  . insulin glargine  8 Units Subcutaneous QHS  . latanoprost  1 drop Both Eyes QHS  . metoprolol succinate  100 mg Oral Daily  . multivitamin with minerals  1 tablet Oral Daily  . pantoprazole  40 mg Oral BID  . piperacillin-tazobactam (ZOSYN)  IV  3.375 g Intravenous Q8H  . senna  1 tablet Oral BID  . tamsulosin  0.4 mg Oral QPC breakfast   Continuous: . lactated ringers 125 mL/hr at 11/18/16 0507   [id number] **OR** acetaminophen, bisacodyl, fentaNYL (SUBLIMAZE) injection, HYDROmorphone (DILAUDID) injection, magic mouthwash, ondansetron (ZOFRAN) IV, polyethylene glycol  Assessment/Plan:  Principal Problem:   Biliary obstruction Active Problems:   Hypertension   Type 2 diabetes, uncontrolled, with peripheral circulatory disorder (HCC)   CAD (coronary artery disease)   Bile leak, postoperative    Biliary obstruction/Bilioma/concern for cholangitis Patient status post cholecystectomy on 1/24 at [facility]. Presented with abdominal pain and found to have elevated alkaline phosphatase and bilirubin. Transferred to [facility] for further workup. Seen by gastroenterology. CT scan showed a fluid collection at the operative site, possible bile leak.  Underwent ERCP and underwent sphincterotomy and biliary stent placement. Also noted to have high-grade duodenal stenosis. LFTs are improving. Patient remains on Zosyn. WBC has improved. Seen by general surgery. No plans for any drainage of the fluid collection at this time.    Duodenal stenosis Noted on ERCP. Management per gastroenterology.Full liquids.  History of CAD, PAD  Seems to be stable. Continue home medications. Aspirin and cilostazol held for  procedure.  Insulin-dependent DM  A1c was 7.6% in December 2017. Managed at home with metformin, Januvia, glipizide, and Humalog 75/25 6 units qAM and 8 units qPM; these are held on admission. Continue to monitor CBGs. Continue Lantus.  Essential Hypertension  Monitor blood pressures. Continue with Toprol.   DVT Prophylaxis: Lovenox    Code Status: Full code  Family Communication: Discussed with the patient and his family Disposition Plan: Management as outlined above. PT evaluation.    LOS: 3 days   [place] [facility] Pager [phone] 11/18/2016, 7:33 AM  If 7PM-7AM, please contact night-coverage at [email or web address], password [facility]

## 2016-11-18 NOTE — Progress Notes (Addendum)
Initial Nutrition Assessment  DOCUMENTATION CODES:   Non-severe (moderate) malnutrition in context of chronic illness  INTERVENTION:  Ensure Enlive po BID, each supplement provides 350 kcal and 20 grams of protein  MVI  NUTRITION DIAGNOSIS:   Malnutrition related to chronic illness, nausea, vomiting, poor appetite as evidenced by 8 percent weight loss in 1 month, moderate depletions of muscle mass, mild depletion of body fat.  GOAL:   Patient will meet greater than or equal to 90% of their needs  MONITOR:   PO intake, Diet advancement, Supplement acceptance, Weight trends, Labs  REASON FOR ASSESSMENT:   Malnutrition Screening Tool    ASSESSMENT:   83 year old Caucasian male with a past medical history of hypertension, non-insulin-dependent diabetes, coronary artery disease, peripheral arterial disease, GERD who had acute cholecystitis on Thanksgiving and had a drain placement initially. He underwent cholecystectomy at [facility] on 1/24. He presented with complains of worsening abdominal pain. He was found to have elevated alkaline phosphatase. Imaging studies raised concern for possible CBD obstruction. He was transferred here for further management due to need for ERCP.  Hx acute Cholecystitis.   10/06/16 perc biliary drain. 11/13/16 lap chole with sugg of CBD stone per [facility] 11/18/16 ERCP with post op bile leak, sphincterotomy, no CBD stones.  High grade duodenal stenosis made for challenging but ultimately successful ERCP.   CT and HIDA scans confirm biloma.   Met with pt in room today. Pt reports that he had surgery on 1/24 and then the very next day on 1/25 he started having N/V and abdominal pain. Pt reports that he has not really eaten well since before his surgery. Pt reports good appetite and oral intake today. Pt eating 100% FL diet and drinking 2 Ensures per day. Per chart, pt has lost 14lbs(8%) in 1 month. This is significant.   Medications reviewed  and include: Vit D, insulin, MVI, protonix, zosyn, senokot, hydromorphone  Labs reviewed: Ca 8.1(L) adj. 9.78 wnl, Alb 1.9(L), tbili 1.7(H) Hgb 9.8(L), Hct 31.8(L) cbgs- 133, 122, 130 x 48 hrs AIC 7.6(H) 09/2016  Nutrition-Focused physical exam completed. Findings are mild fat depletion in upper arms, moderate muscle depletion in clavicles, calfs, and temporal region and no edema.   Diet Order:  Diet full liquid Room service appropriate? Yes; Fluid consistency: Thin  Skin:  Wound (see comment) (abd incision )  Last BM:  1/26  Height:   Ht Readings from Last 1 Encounters:  11/15/16 '5\' 11"'  (1.803 m)    Weight:   Wt Readings from Last 1 Encounters:  11/18/16 172 lb 11.3 oz (78.3 kg)    Ideal Body Weight:  78 kg  BMI:  Body mass index is 24.09 kg/m.  Estimated Nutritional Needs:   Kcal:  2000-2300kcal/day   Protein:  86-102g/day   Fluid:  >2L/day   EDUCATION NEEDS:   No education needs identified at this time  Koleen Distance, RD, LDN Pager #[phone]

## 2016-11-18 NOTE — Progress Notes (Signed)
Daily Rounding Note  11/18/2016, 8:13 AM  LOS: 3 days   SUBJECTIVE:   Chief complaint: abdominal pain.  Improved but not entirely resolved.  Located mostly in epigastrium.  Some nausea this AM when having a surge of pain.  Overall feeling significantly better     Large BM yesterday and this AM.  OBJECTIVE:         Vital signs in last 24 hours:    Temp:  [97.5 F (36.4 C)-99 F (37.2 C)] 97.8 F (36.6 C) (01/29 0511) Pulse Rate:  [73-89] 73 (01/29 0511) Resp:  [15-22] 17 (01/29 0511) BP: (125-159)/(58-71) 132/60 (01/29 0511) SpO2:  [92 %-98 %] 94 % (01/29 0511) Weight:  [78.3 kg (172 lb 11.3 oz)] 78.3 kg (172 lb 11.3 oz) (01/29 0511) Last BM Date: 11/15/16 Filed Weights   11/16/16 0532 11/17/16 0437 11/18/16 0511  Weight: 80.2 kg (176 lb 12.9 oz) 79.7 kg (175 lb 10.6 oz) 78.3 kg (172 lb 11.3 oz)   General: frail but alert and conmfortable   Heart: RRR Chest: no labored breathing.  Rales in bases.  Abdomen: soft, mild distention, mild tenderness across upper abdomen, no guard or rebound.  Some bruising in area of laparoscopy scares  Extremities: no CCE Neuro/Psych:  Cooperative, calm, engaged.  No gross deficits.   Intake/Output from previous day: 01/28 0701 - 01/29 0700 In: 1400 [P.O.:600; I.V.:800] Out: 1531 [Urine:1525; Stool:1; Blood:5]  Intake/Output this shift: No intake/output data recorded.  Lab Results:  Recent Labs  11/16/16 0725 11/17/16 0523 11/18/16 0523  WBC 12.0* 9.1 7.2  HGB 11.2* 10.3* 9.8*  HCT 36.3* 33.6* 31.8*  PLT 246 244 229   BMET  Recent Labs  11/16/16 0725 11/17/16 0523 11/18/16 0523  NA 139 135 139  K 3.7 3.4* 3.5  CL 104 102 106  CO2 26 25 27   GLUCOSE 133* 122* 130*  BUN 9 11 12   CREATININE 0.89 0.97 0.95  CALCIUM 8.5* 8.4* 8.1*   LFT  Recent Labs  11/16/16 0725 11/17/16 0523 11/18/16 0523  PROT 5.5* 5.4* 4.9*  ALBUMIN 2.2* 2.1* 1.9*  AST 37 24 20  ALT  115* 75* 50  ALKPHOS 197* 160* 145*  BILITOT 2.0* 1.7* 1.7*   PT/INR  Recent Labs  11/16/16 0725  LABPROT 17.0*  INR 1.37   Hepatitis Panel No results for input(s): HEPBSAG, HCVAB, HEPAIGM, HEPBIGM in the last 72 hours.  Studies/Results: Nm Hepatobiliary Liver Func  Result Date: 11/17/2016 CLINICAL DATA:  83 year old male status post cholecystectomy. Evaluate for bile leak. EXAM: NUCLEAR MEDICINE HEPATOBILIARY IMAGING TECHNIQUE: Sequential images of the abdomen were obtained out to 60 minutes following intravenous administration of radiopharmaceutical. RADIOPHARMACEUTICALS:  5.15 mCi Tc-39m  Choletec IV COMPARISON:  Abdominal CT dated 11/16/2016 FINDINGS: Prompt uptake and biliary excretion of activity by the liver is seen. There is accumulation of radiotracer outside of the biliary tree corresponding to the fluid collection seen on the CT within the gallbladder fossa as well as sacculation of the radiopharmaceutical within the fluid collection along the inferior surface of the left lobe of the liver. Findings most consistent with contained bite leak or biloma. No extension of radiotracer noted inferiorly within the abdomen. IMPRESSION: Extra-biliary collection of radiopharmaceutical in the gallbladder fossa and along the inferior surface of the left lobe of the liver corresponding to the fluid collections seen on the CT and most consistent with contained bite leak/biloma. Electronically Signed   By: Anner Crete  M.D.   On: 11/17/2016 00:41   Ct Abdomen Pelvis W Contrast  Result Date: 11/16/2016 CLINICAL DATA:  Patient with recent cholecystectomy 3 days prior. Dilated intrahepatic and extrahepatic bile ducts. Evaluate for biloma. EXAM: CT ABDOMEN AND PELVIS WITH CONTRAST TECHNIQUE: Multidetector CT imaging of the abdomen and pelvis was performed using the standard protocol following bolus administration of intravenous contrast. CONTRAST:  172mL ISOVUE-300 IOPAMIDOL (ISOVUE-300) INJECTION  61% COMPARISON:  CT abdomen pelvis 10/21/2016. FINDINGS: Lower chest: Normal heart size. Coronary arterial vascular calcifications. Small layering bilateral pleural effusions. Subpleural ground-glass and consolidative opacities within the lower lobes bilaterally. Hepatobiliary: Liver is normal in size and contour. Postsurgical changes compatible with interval cholecystectomy. There is flocculent fluid demonstrated within the gallbladder fossa. There are a few small foci of gas within the gallbladder fossa. Additionally, there is fluid extending from the gallbladder fossa into the gastrohepatic ligament and about the liver. Additionally fluid extends within the left upper quadrant around the spleen. Mild intrahepatic and extrahepatic biliary ductal dilatation. Pancreas: Mild pancreatic atrophy. Spleen: Unremarkable. Adrenals/Urinary Tract: The adrenal glands are normal. 1.5 cm cyst superior pole right kidney, 1.5 cm cyst inferior pole right kidney and 1.9 cm cyst interpolar region left kidney. There is a 1.5 cm cyst within the superior pole left kidney. Urinary bladder is unremarkable. Stomach/Bowel: Sigmoid colonic diverticulosis. Small amount of free fluid in the pelvis. Free intraperitoneal air within the upper abdomen. Normal appendix. There a few mildly distended loops of small bowel within the central abdomen. Normal morphology of the stomach. Vascular/Lymphatic: Peripheral calcified atherosclerotic plaque. Retroaortic left renal vein. No retroperitoneal lymphadenopathy. Reproductive: Prostate unremarkable. Other: Small bilateral fat containing inguinal hernias, left-greater-than-right. Musculoskeletal: Multilevel degenerative disc disease. Grade 1 anterolisthesis L4 on L5. IMPRESSION: Postsurgical changes compatible with interval cholecystectomy. Flocculent fluid within the gallbladder fossa likely secondary to postsurgical change/ surgical material. Recommend correlation with operative history. Additionally,  there is fluid extending from the cholecystectomy site into the central abdomen and left upper quadrant which is nonspecific however may be secondary to a bile leak. Recommend correlation with HIDA scan. Small amount of free intraperitoneal air within the upper abdomen likely reflective of recent postoperative state. Mild intrahepatic and extrahepatic biliary ductal dilatation. Aortic atherosclerosis. Mild distention of the small bowel centrally likely secondary to ileus. Small layering bilateral pleural effusions with underlying ground-glass consolidative opacities favored to represent atelectasis. Infection not excluded. These results will be called to the ordering clinician or representative by the Radiologist Assistant, and communication documented in the PACS or zVision Dashboard. Electronically Signed   By: Lovey Newcomer M.D.   On: 11/16/2016 20:04   Dg Ercp Biliary & Pancreatic Ducts  Result Date: 11/17/2016 CLINICAL DATA:  Bile leak with stent placement and sphincterotomy. EXAM: ERCP TECHNIQUE: Multiple spot images obtained with the fluoroscopic device and submitted for interpretation post-procedure. FLUOROSCOPY TIME:  Fluoroscopy Time:  19 minutes and 42 seconds Number of Acquired Spot Images: 8 COMPARISON:  Abdominal CT 11/16/2016. Hepatobiliary examination 11/16/2016 FINDINGS: First set of images demonstrate cannulation and opacification of the main pancreatic duct. The biliary system was cannulated and opacified with contrast. Final image demonstrates a catheter or stent in the common bile duct. Limited evaluation for contrast extravasation. IMPRESSION: Cannulation and opacification of the biliary system for sphincterotomy and stent placement. These images were submitted for radiologic interpretation only. Please see the procedural report for the amount of contrast and the fluoroscopy time utilized. Electronically Signed   By: Markus Daft M.D.   On: 11/17/2016  14:59   Scheduled Meds: . atorvastatin   40 mg Oral q1800  . cholecalciferol  2,000 Units Oral Daily  . enoxaparin (LOVENOX) injection  40 mg Subcutaneous QHS  . famotidine (PEPCID) IV  20 mg Intravenous Q12H  . feeding supplement (ENSURE ENLIVE)  237 mL Oral BID  . gabapentin  400 mg Oral TID  . insulin aspart  0-9 Units Subcutaneous Q4H  . insulin glargine  8 Units Subcutaneous QHS  . latanoprost  1 drop Both Eyes QHS  . metoprolol succinate  100 mg Oral Daily  . multivitamin with minerals  1 tablet Oral Daily  . pantoprazole  40 mg Oral BID  . piperacillin-tazobactam (ZOSYN)  IV  3.375 g Intravenous Q8H  . senna  1 tablet Oral BID  . tamsulosin  0.4 mg Oral QPC breakfast   Continuous Infusions: . lactated ringers 125 mL/hr at 11/18/16 0507   PRN Meds:.acetaminophen **OR** acetaminophen, bisacodyl, fentaNYL (SUBLIMAZE) injection, HYDROmorphone (DILAUDID) injection, magic mouthwash, ondansetron (ZOFRAN) IV, polyethylene glycol  ASSESMENT:   *   Hx acute Cholecystitis.   10/06/16 perc biliary drain. 11/13/16 lap chole with sugg of CBD stone per [facility] 11/18/16 ERCP with post op bile leak, sphincterotomy, biliary stent, no CBD stones.  High grade duodenal stenosis made for challenging but ultimately successful ERCP.   CT and HIDA scans confirm biloma.  LFTs and clinical status improved.   Dr. Grandville Silos of surgery did not feel perc drain was imminent.  No opinion yet from IR.     PLAN   *  Full liquids.  Add BID Ensure.    Henry Lindsey  11/18/2016, 8:13 AM Pager: [phone]     Attending physician's note   I have taken an interval history, reviewed the chart and examined the patient. I agree with the Advanced Practitioner's note, impression and recommendations.  * Bile leak and biliary dilation post lap chole-pt underwent ERCP with sphinctertomy and biliary stent placement yesterday. LFTs improved. Overall improved and tolerating full liquids.  * GB fossa fluid collection not felt to need drainage by general surgery.  *  Mild ileus, resolving bile leak and post lap chole recovery leading to abdominal pain. Continue full liquids for now. Ambulate. Consider discharge tomorrow if he continues to improve.  * Duodenal stricture, continue PPI long term.  * Anemia without GI bleeding noted. * GI follow up with Dr. Scarlette Shorts in 1 month.  * Gen surgical follow up with Dr. Hampton Abbot in 1-2 weeks.  Lucio Edward, MD [facility] [phone] Mon-Fri 8a-5p [phone] after 5p, weekends, holidays

## 2016-11-18 NOTE — Progress Notes (Signed)
1 Day Post-Op  Subjective: Status post ERCP, on full liquids. He is not complaining of pain but he is still quite distended. Positive bowel sounds, and positive BM this a.m.  Objective: Vital signs in last 24 hours: Temp:  [97.7 F (36.5 C)-98.1 F (36.7 C)] 97.8 F (36.6 C) (01/29 0511) Pulse Rate:  [73-80] 73 (01/29 0511) Resp:  [16-17] 17 (01/29 0511) BP: (125-135)/(58-67) 132/60 (01/29 0511) SpO2:  [94 %-97 %] 94 % (01/29 0511) Weight:  [78.3 kg (172 lb 11.3 oz)] 78.3 kg (172 lb 11.3 oz) (01/29 0511) Last BM Date: 11/18/16 600 by mouth yesterday 800 IV yesterday Urine 1525 BM 1 Afebrile vital signs are stable. Total bilirubin 1.7, stable ALT/AST are normal; alkaline phosphatase 145, glucose 130.  Intake/Output from previous day: 01/28 0701 - 01/29 0700 In: 1400 [P.O.:600; I.V.:800] Out: 1531 [Urine:1525; Stool:1; Blood:5] Intake/Output this shift: Total I/O In: 60 [P.O.:60] Out: -   General appearance: alert, cooperative and no distress GI: Distended, positive bowel, sounds positive BM. Port sites all look good.  Lab Results:   Recent Labs  11/17/16 0523 11/18/16 0523  WBC 9.1 7.2  HGB 10.3* 9.8*  HCT 33.6* 31.8*  PLT 244 229    BMET  Recent Labs  11/17/16 0523 11/18/16 0523  NA 135 139  K 3.4* 3.5  CL 102 106  CO2 25 27  GLUCOSE 122* 130*  BUN 11 12  CREATININE 0.97 0.95  CALCIUM 8.4* 8.1*   PT/INR  Recent Labs  11/16/16 0725  LABPROT 17.0*  INR 1.37     Recent Labs Lab 11/14/16 0455 11/15/16 0958 11/16/16 0725 11/17/16 0523 11/18/16 0523  AST 179* 162* 37 24 20  ALT 212* 247* 115* 75* 50  ALKPHOS 231* 341* 197* 160* 145*  BILITOT 0.6 3.8* 2.0* 1.7* 1.7*  PROT 5.6* 7.6 5.5* 5.4* 4.9*  ALBUMIN 3.0* 3.8 2.2* 2.1* 1.9*     Lipase     Component Value Date/Time   LIPASE 55 (H) 11/15/2016 0958     Studies/Results: Nm Hepatobiliary Liver Func  Result Date: 11/17/2016 CLINICAL DATA:  83 year old male status post  cholecystectomy. Evaluate for bile leak. EXAM: NUCLEAR MEDICINE HEPATOBILIARY IMAGING TECHNIQUE: Sequential images of the abdomen were obtained out to 60 minutes following intravenous administration of radiopharmaceutical. RADIOPHARMACEUTICALS:  5.15 mCi Tc-24m Choletec IV COMPARISON:  Abdominal CT dated 11/16/2016 FINDINGS: Prompt uptake and biliary excretion of activity by the liver is seen. There is accumulation of radiotracer outside of the biliary tree corresponding to the fluid collection seen on the CT within the gallbladder fossa as well as sacculation of the radiopharmaceutical within the fluid collection along the inferior surface of the left lobe of the liver. Findings most consistent with contained bite leak or biloma. No extension of radiotracer noted inferiorly within the abdomen. IMPRESSION: Extra-biliary collection of radiopharmaceutical in the gallbladder fossa and along the inferior surface of the left lobe of the liver corresponding to the fluid collections seen on the CT and most consistent with contained bite leak/biloma. Electronically Signed   By: AAnner CreteM.D.   On: 11/17/2016 00:41   Ct Abdomen Pelvis W Contrast  Result Date: 11/16/2016 CLINICAL DATA:  Patient with recent cholecystectomy 3 days prior. Dilated intrahepatic and extrahepatic bile ducts. Evaluate for biloma. EXAM: CT ABDOMEN AND PELVIS WITH CONTRAST TECHNIQUE: Multidetector CT imaging of the abdomen and pelvis was performed using the standard protocol following bolus administration of intravenous contrast. CONTRAST:  1047mISOVUE-300 IOPAMIDOL (ISOVUE-300) INJECTION 61% COMPARISON:  CT abdomen pelvis 10/21/2016. FINDINGS: Lower chest: Normal heart size. Coronary arterial vascular calcifications. Small layering bilateral pleural effusions. Subpleural ground-glass and consolidative opacities within the lower lobes bilaterally. Hepatobiliary: Liver is normal in size and contour. Postsurgical changes compatible with  interval cholecystectomy. There is flocculent fluid demonstrated within the gallbladder fossa. There are a few small foci of gas within the gallbladder fossa. Additionally, there is fluid extending from the gallbladder fossa into the gastrohepatic ligament and about the liver. Additionally fluid extends within the left upper quadrant around the spleen. Mild intrahepatic and extrahepatic biliary ductal dilatation. Pancreas: Mild pancreatic atrophy. Spleen: Unremarkable. Adrenals/Urinary Tract: The adrenal glands are normal. 1.5 cm cyst superior pole right kidney, 1.5 cm cyst inferior pole right kidney and 1.9 cm cyst interpolar region left kidney. There is a 1.5 cm cyst within the superior pole left kidney. Urinary bladder is unremarkable. Stomach/Bowel: Sigmoid colonic diverticulosis. Small amount of free fluid in the pelvis. Free intraperitoneal air within the upper abdomen. Normal appendix. There a few mildly distended loops of small bowel within the central abdomen. Normal morphology of the stomach. Vascular/Lymphatic: Peripheral calcified atherosclerotic plaque. Retroaortic left renal vein. No retroperitoneal lymphadenopathy. Reproductive: Prostate unremarkable. Other: Small bilateral fat containing inguinal hernias, left-greater-than-right. Musculoskeletal: Multilevel degenerative disc disease. Grade 1 anterolisthesis L4 on L5. IMPRESSION: Postsurgical changes compatible with interval cholecystectomy. Flocculent fluid within the gallbladder fossa likely secondary to postsurgical change/ surgical material. Recommend correlation with operative history. Additionally, there is fluid extending from the cholecystectomy site into the central abdomen and left upper quadrant which is nonspecific however may be secondary to a bile leak. Recommend correlation with HIDA scan. Small amount of free intraperitoneal air within the upper abdomen likely reflective of recent postoperative state. Mild intrahepatic and extrahepatic  biliary ductal dilatation. Aortic atherosclerosis. Mild distention of the small bowel centrally likely secondary to ileus. Small layering bilateral pleural effusions with underlying ground-glass consolidative opacities favored to represent atelectasis. Infection not excluded. These results will be called to the ordering clinician or representative by the Radiologist Assistant, and communication documented in the PACS or zVision Dashboard. Electronically Signed   By: Lovey Newcomer M.D.   On: 11/16/2016 20:04   Dg Ercp Biliary & Pancreatic Ducts  Result Date: 11/17/2016 CLINICAL DATA:  Bile leak with stent placement and sphincterotomy. EXAM: ERCP TECHNIQUE: Multiple spot images obtained with the fluoroscopic device and submitted for interpretation post-procedure. FLUOROSCOPY TIME:  Fluoroscopy Time:  19 minutes and 42 seconds Number of Acquired Spot Images: 8 COMPARISON:  Abdominal CT 11/16/2016. Hepatobiliary examination 11/16/2016 FINDINGS: First set of images demonstrate cannulation and opacification of the main pancreatic duct. The biliary system was cannulated and opacified with contrast. Final image demonstrates a catheter or stent in the common bile duct. Limited evaluation for contrast extravasation. IMPRESSION: Cannulation and opacification of the biliary system for sphincterotomy and stent placement. These images were submitted for radiologic interpretation only. Please see the procedural report for the amount of contrast and the fluoroscopy time utilized. Electronically Signed   By: Markus Daft M.D.   On: 11/17/2016 14:59   Prior to Admission medications   Medication Sig Start Date End Date Taking? Authorizing Provider  Alum & Mag Hydroxide-Simeth (MAGIC MOUTHWASH) SOLN Take 5 mLs by mouth 3 (three) times daily as needed for mouth pain.    Historical Provider, MD  amLODipine (NORVASC) 10 MG tablet Take 1 tablet (10 mg total) by mouth daily. 11/11/16   Minna Merritts, MD  aspirin 81 MG tablet Take  1  tablet (81 mg total) by mouth daily. 11/05/16   Minna Merritts, MD  atorvastatin (LIPITOR) 40 MG tablet TAKE 1 TABLET BY MOUTH EVERY DAY 07/15/16   [name], MD  Blood Glucose Monitoring Suppl (ONE TOUCH ULTRA SYSTEM KIT) w/Device KIT 1 kit by Does not apply route once. Use DX code E11.59 10/18/15   [name], MD  Cholecalciferol (VITAMIN D3) 2000 units TABS Take 1 tablet by mouth daily.    Historical Provider, MD  cilostazol (PLETAL) 100 MG tablet TAKE 1 TABLET(100 MG) BY MOUTH TWICE DAILY 08/13/16   [name], MD  famotidine (PEPCID) 20 MG tablet TAKE 1 TABLET(20 MG) BY MOUTH TWICE DAILY 05/08/16   [name], MD  feeding supplement, ENSURE ENLIVE, (ENSURE ENLIVE) LIQD Take 237 mLs by mouth daily. Patient taking differently: Take 237 mLs by mouth 2 (two) times daily.  10/23/16 11/22/16  Nicholes Mango, MD  fenofibrate micronized (LOFIBRA) 134 MG capsule TAKE 1 CAPSULE BY MOUTH EVERY MORNING BEFORE BREAKFAST Patient taking differently: TAKE 1 CAPSULE tid 08/28/16   [name], MD  gabapentin (NEURONTIN) 400 MG capsule TAKE 1 CAPSULE(400 MG) BY MOUTH THREE TIMES DAILY 09/09/16   [name], MD  glipiZIDE (GLUCOTROL) 10 MG tablet TAKE 1 TABLET(10 MG) BY MOUTH TWICE DAILY 03/27/16   [name], MD  glucose blood test strip Use as instructed three times daily 05/03/16   [name], MD  HYDROcodone-acetaminophen (NORCO/VICODIN) 5-325 MG tablet Take 1-2 tablets by mouth every 6 (six) hours as needed for moderate pain. 11/13/16   Olean Ree, MD  INS SYRINGE/NEEDLE 1CC/28G (B-D INSULIN SYRINGE 1CC/28G) 28G X 1/2" 1 ML MISC USE TO ADMINISTER INSULIN DAILY 02/13/16   [name], MD  insulin lispro protamine-lispro (HUMALOG MIX 75/25) (75-25) 100 UNIT/ML SUSP injection Inject 10 units right before  breakfast and 6 units before dinner Patient taking differently: 6-8 Units 2 (two) times daily. Inject 6 qam and 8 qpm 05/03/16   [name], MD  JANUVIA 100 MG tablet TAKE 1  TABLET(100 MG) BY MOUTH DAILY. NOTE DOSE INCREASE 08/13/16   [name], MD  latanoprost (XALATAN) 0.005 % ophthalmic solution INSTILL 1 DROP INTO BOTH EYES QD 11/01/15   Historical Provider, MD  metFORMIN (GLUCOPHAGE) 1000 MG tablet TAKE 1 TABLET BY MOUTH TWICE DAILY 07/26/16   [name], MD  metoprolol succinate (TOPROL-XL) 100 MG 24 hr tablet Take 1 tablet (100 mg total) by mouth daily. Take with or immediately following a meal. 11/11/16   Minna Merritts, MD  Multiple Vitamin (MULTIVITAMIN WITH MINERALS) TABS tablet Take 1 tablet by mouth daily.    Historical Provider, MD  nitroGLYCERIN (NITROSTAT) 0.4 MG SL tablet Place 1 tablet (0.4 mg total) under the tongue every 5 (five) minutes as needed for chest pain. MAXIMUM 3 TABLETS 10/04/16   [name], MD  ondansetron (ZOFRAN) 4 MG tablet Take 1 tablet (4 mg total) by mouth every 8 (eight) hours as needed for nausea or vomiting. Patient not taking: Reported on 11/11/2016 09/03/16   [name], MD  [facility] DELICA LANCETS 93X MISC Use three times daily to check blood sugar. 10/18/15   [name], MD  pantoprazole (PROTONIX) 40 MG tablet TAKE 1 TABLET(40 MG) BY MOUTH TWICE DAILY BEFORE A MEAL 11/08/16   [name], MD  tamsulosin (FLOMAX) 0.4 MG CAPS capsule Take 0.4 mg by mouth daily after breakfast.  Historical Provider, MD  traMADol (ULTRAM) 50 MG tablet Take 50 mg by mouth at bedtime.  11/24/13   Historical Provider, MD    Medications: . atorvastatin  40 mg Oral q1800  . cholecalciferol  2,000 Units Oral Daily  . enoxaparin (LOVENOX) injection  40 mg Subcutaneous QHS  . feeding supplement (ENSURE ENLIVE)  237 mL Oral BID  . gabapentin  400 mg Oral TID  . insulin aspart  0-9 Units Subcutaneous Q4H  . insulin glargine  8 Units Subcutaneous QHS  . latanoprost  1 drop Both Eyes QHS  . metoprolol succinate  100 mg Oral Daily  . multivitamin with minerals  1 tablet Oral Daily  . pantoprazole  40 mg Oral BID  .  piperacillin-tazobactam (ZOSYN)  IV  3.375 g Intravenous Q8H  . senna  1 tablet Oral BID  . tamsulosin  0.4 mg Oral QPC breakfast   . lactated ringers 50 mL/hr at 11/18/16 0836    Assessment/Plan Bile leak after laparoscopic cholecystectomy 124/18 Dr.Piscoya, [facility]. ERCP with post op bile leak, sphincterotomy, no CBD stones.  High grade duodenal stenosis made for challenging but ultimately successful ERCP Status post percutaneous cholecystostomy 10/06/16. History of CVA Type 2 diabetes Hypertension Neuromuscular disorder Vascular occlusive disease status post PTCA, right SFA. FEN:  IV fluids/full liquids ID:  Zosyn 11/14/16 =>> day 4 DVT:  Lovenox   Plan: Nothing surgical to add at this time. I'm still concerned about his distention. Mobilize and monitor. He will eventually follow-up with Dr.Piscoya.  LOS: 3 days    Henry Lindsey 11/18/2016 [phone]

## 2016-11-18 NOTE — Progress Notes (Signed)
Occupational Therapy Evaluation Patient Details Name: NAYQUAN STRIMPLE MRN: [id number] DOB: 1933-01-29 Today's Date: 11/18/2016    History of Present Illness Terence initially had cholecystitis last Thanksgiving. Due to his medical issues, a perc chole drain was placed. He improved medically and underwent lap chole 1/24 by Dr. Hampton Abbot at [facility]. He was discharged from their 1/25. He returned to the emergency room there 1/26 complaining of abdominal pain. Evaluation was concerning for postoperative bile leak. He was transferred to the hospitalist service at [facility]. He underwent hida scan confirming bile leak. He underwent ERCP with stent placement today by Dr. Scarlette Shorts.   Clinical Impression   Patient evaluated by Occupational Therapy with no further acute OT needs identified. All education has been completed and the patient has no further questions. See below for any follow-up Occupational Therapy or equipment needs. OT to sign off. Thank you for referral.      Follow Up Recommendations  No OT follow up    Equipment Recommendations  None recommended by OT    Recommendations for Other Services       Precautions / Restrictions Precautions Precautions: Fall Restrictions Weight Bearing Restrictions: No      Mobility Bed Mobility Overal bed mobility: Independent             General bed mobility comments: up with pT in hall on arrival  Transfers Overall transfer level: Independent                    Balance Overall balance assessment: Needs assistance Sitting-balance support: No upper extremity supported;Feet supported Sitting balance-Leahy Scale: Good   Postural control: Posterior lean (question baseline drift ) Standing balance support: No upper extremity supported;During functional activity;Single extremity supported Standing balance-Leahy Scale: Poor Standing balance comment: Pt somewhat reliant on one UE support for balance.  Has a posterior lean but  appears to be the way he postures at baseline per pt.              High level balance activites: Direction changes;Turns;Sudden stops High Level Balance Comments: min guard assist with above.             ADL Overall ADL's : Modified independent                                       General ADL Comments: completed shower transfer, toilet transfer, sink level grooming, LB adl, basic transfers. and sit<>stand from chair. Pt wiithout acute needs at this time. Pt will have friend to (A) 24/7 at D/c     Vision     Perception     Praxis      Pertinent Vitals/Pain Pain Assessment: Faces Faces Pain Scale: Hurts a little bit Pain Location: abdomen incision site Pain Descriptors / Indicators: Operative site guarding Pain Intervention(s): Limited activity within patient's tolerance;Monitored during session;Premedicated before session;Repositioned     Hand Dominance Right   Extremity/Trunk Assessment Upper Extremity Assessment Upper Extremity Assessment: Overall WFL for tasks assessed   Lower Extremity Assessment Lower Extremity Assessment: Defer to PT evaluation   Cervical / Trunk Assessment Cervical / Trunk Assessment: Other exceptions (pt states "football back" but appears to be hunchback)   Communication Communication Communication: No difficulties   Cognition Arousal/Alertness: Awake/alert Behavior During Therapy: WFL for tasks assessed/performed Overall Cognitive Status: Within Functional Limits for tasks assessed  General Comments       Exercises       Shoulder Instructions      Home Living Family/patient expects to be discharged to:: Private residence Living Arrangements: Spouse/significant other;Children Available Help at Discharge: Friend(s) Type of Home: House Home Access: Stairs to enter [employer] of Steps: 4 Entrance Stairs-Rails: None Home Layout: Two level;Bed/bath upstairs;Full bath on  main level Alternate Level Stairs-Number of Steps: flight Alternate Level Stairs-Rails: Right Bathroom Shower/Tub: Occupational psychologist: Handicapped height Bathroom Accessibility: Yes   Home Equipment: Environmental consultant - 2 wheels;Cane - single point;Grab bars - tub/shower          Prior Functioning/Environment Level of Independence: Independent                 OT Problem List:     OT Treatment/Interventions:      OT Goals(Current goals can be found in the care plan section) Acute Rehab OT Goals Patient Stated Goal: to go home  OT Frequency:     Barriers to D/C:            Co-evaluation              End of Session Equipment Utilized During Treatment: Gait belt Nurse Communication: Mobility status;Precautions  Activity Tolerance: Patient tolerated treatment well Patient left: in chair;with call bell/phone within reach   Time: 1002-1015 OT Time Calculation (min): 13 min Charges:  OT General Charges $OT Visit: 1 Procedure OT Evaluation $OT Eval Low Complexity: 1 Procedure G-Codes:    Parke Poisson B Dec 06, 2016, 11:17 AM  Jeri Modena   OTR/L PagerIP:[phone] Office: [phone] .

## 2016-11-19 ENCOUNTER — Encounter (HOSPITAL_COMMUNITY): Payer: Self-pay | Admitting: General Practice

## 2016-11-19 LAB — COMPREHENSIVE METABOLIC PANEL
ALBUMIN: 1.8 g/dL — AB (ref 3.5–5.0)
ALT: 40 U/L (ref 17–63)
AST: 21 U/L (ref 15–41)
Alkaline Phosphatase: 154 U/L — ABNORMAL HIGH (ref 38–126)
Anion gap: 9 (ref 5–15)
BILIRUBIN TOTAL: 0.8 mg/dL (ref 0.3–1.2)
BUN: 9 mg/dL (ref 6–20)
CHLORIDE: 104 mmol/L (ref 101–111)
CO2: 26 mmol/L (ref 22–32)
Calcium: 7.9 mg/dL — ABNORMAL LOW (ref 8.9–10.3)
Creatinine, Ser: 0.86 mg/dL (ref 0.61–1.24)
GFR calc Af Amer: >60 mL/min (ref >60)
GFR calc non Af Amer: >60 mL/min (ref >60)
GLUCOSE: 140 mg/dL — AB (ref 65–99)
POTASSIUM: 3.3 mmol/L — AB (ref 3.5–5.1)
Sodium: 139 mmol/L (ref 135–145)
TOTAL PROTEIN: 5 g/dL — AB (ref 6.5–8.1)

## 2016-11-19 LAB — CBC
HEMATOCRIT: 29.1 % — AB (ref 39.0–52.0)
Hemoglobin: 9.1 g/dL — ABNORMAL LOW (ref 13.0–17.0)
MCH: 26.6 pg (ref 26.0–34.0)
MCHC: 31.3 g/dL (ref 30.0–36.0)
MCV: 85.1 fL (ref 78.0–100.0)
Platelets: 230 10*3/uL (ref 150–400)
RBC: 3.42 MIL/uL — ABNORMAL LOW (ref 4.22–5.81)
RDW: 14.9 % (ref 11.5–15.5)
WBC: 6.9 10*3/uL (ref 4.0–10.5)

## 2016-11-19 LAB — GLUCOSE, CAPILLARY
GLUCOSE-CAPILLARY: 246 mg/dL — AB (ref 65–99)
GLUCOSE-CAPILLARY: 170 mg/dL — AB (ref 65–99)
GLUCOSE-CAPILLARY: 126 mg/dL — AB (ref 65–99)
Glucose-Capillary: 137 mg/dL — ABNORMAL HIGH (ref 65–99)
Glucose-Capillary: 152 mg/dL — ABNORMAL HIGH (ref 65–99)
Glucose-Capillary: 222 mg/dL — ABNORMAL HIGH (ref 65–99)

## 2016-11-19 MED ORDER — POTASSIUM CHLORIDE CRYS ER 20 MEQ PO TBCR
40.0000 meq | EXTENDED_RELEASE_TABLET | Freq: Once | ORAL | Status: AC
Start: 2016-11-19 — End: 2016-11-19
  Administered 2016-11-19: 40 meq via ORAL
  Filled 2016-11-19: qty 2

## 2016-11-19 NOTE — Progress Notes (Signed)
[facility] PROGRESS NOTE  Henry Lindsey [id number] DOB: 1932/11/07 DOA: 11/15/2016  PCP: [name], MD  Brief History/Interval Summary: 83 year old Caucasian male with a past medical history of hypertension, non-insulin-dependent diabetes, coronary artery disease, peripheral arterial disease, GERD who had acute cholecystitis on Thanksgiving and had a drain placement initially. He underwent cholecystectomy at [facility] on 1/24. He presented with complains of worsening abdominal pain. He was found to have elevated alkaline phosphatase. Imaging studies raised concern for possible CBD obstruction. He was transferred here and was seen by gastroenterology. Underwent ERCP with stent placement.  Reason for Visit: Biliary obstruction. Possible cholangitis.  Consultants: Gastroenterology. General surgery.  Procedures:  Recent cholecystectomy on 1/24 at [place]  ERCP 1/28 Impression:                1. Recent cholecystectomy with postoperative bile leak 2. Status post ERCP with sphincterotomy and biliary stent placement 3. 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  Antibiotics: Zosyn  Subjective/Interval History: Patient feels better. Abdominal pain has significantly improved. Still has some discomfort in the upper abdomen. Denies any nausea or vomiting. Tolerating his liquid diet.    ROS: Denies any chest pain or shortness of breath.  Objective:  Vital Signs  Vitals:   11/18/16 1414 11/18/16 2023 11/19/16 0419 11/19/16 0826  BP: (!) 148/58 (!) 130/51 (!) 144/65 (!) 158/64  Pulse: 81 80 82 82  Resp:  16 17 18   Temp: 97.7 F (36.5 C) 98.5 F (36.9 C) 98.6 F (37 C) 98.6 F (37 C)  TempSrc: Oral Oral Oral Oral  SpO2: 97% 96% 93% 93%  Weight:   77.8 kg (171 lb 8.3 oz)     Intake/Output Summary (Last 24 hours) at 11/19/16 1018 Last data filed at 11/19/16 0923  Gross per 24 hour  Intake          1679.17 ml  Output              975  ml  Net           704.17 ml   Filed Weights   11/17/16 0437 11/18/16 0511 11/19/16 0419  Weight: 79.7 kg (175 lb 10.6 oz) 78.3 kg (172 lb 11.3 oz) 77.8 kg (171 lb 8.3 oz)    General appearance: Awake, alert. No distress.  Resp: Good air entry bilaterally. No wheezing, rales or rhonchi. Normal effort.  Cardio: regular rate and rhythm, S1, S2 normal, no murmur, click, rub or gallop GI: Abdomen is mildly distended but soft. Very mildly tender today. Much improved compared to before. No masses or organomegaly. No rebound, rigidity or guarding. Bowel sounds present. Extremities: extremities normal, atraumatic, no cyanosis or edema Neurologic: Awake and alert. Oriented 3. No focal neurological deficits.   Lab Results:  Data Reviewed: I have personally reviewed following labs and imaging studies  CBC:  Recent Labs Lab 11/15/16 0958 11/16/16 0725 11/17/16 0523 11/18/16 0523 11/19/16 0542  WBC 14.4* 12.0* 9.1 7.2 6.9  HGB 12.7* 11.2* 10.3* 9.8* 9.1*  HCT 38.3* 36.3* 33.6* 31.8* 29.1*  MCV 82.8 86.4 85.7 84.6 85.1  PLT 298 246 244 229 [id number]    Basic Metabolic Panel:  Recent Labs Lab 11/15/16 0958 11/16/16 0725 11/17/16 0523 11/18/16 0523 11/19/16 0542  NA 136 139 135 139 139  K 3.7 3.7 3.4* 3.5 3.3*  CL 97* 104 102 106 104  CO2 28 26 25 27 26   GLUCOSE 183* 133* 122* 130* 140*  BUN 8 9 11  12 9  CREATININE 0.68 0.89 0.97 0.95 0.86  CALCIUM 9.2 8.5* 8.4* 8.1* 7.9*    GFR: Estimated Creatinine Clearance: 71.7 mL/min (by C-G formula based on SCr of 0.86 mg/dL).  Liver Function Tests:  Recent Labs Lab 11/15/16 0958 11/16/16 0725 11/17/16 0523 11/18/16 0523 11/19/16 0542  AST 162* 37 24 20 21   ALT 247* 115* 75* 50 40  ALKPHOS 341* 197* 160* 145* 154*  BILITOT 3.8* 2.0* 1.7* 1.7* 0.8  PROT 7.6 5.5* 5.4* 4.9* 5.0*  ALBUMIN 3.8 2.2* 2.1* 1.9* 1.8*     Recent Labs Lab 11/15/16 0958  LIPASE 55*    Coagulation Profile:  Recent Labs Lab 11/16/16 0725  INR  1.37    Cardiac Enzymes:  Recent Labs Lab 11/15/16 0958  TROPONINI <0.03    CBG:  Recent Labs Lab 11/18/16 1621 11/18/16 1951 11/18/16 2356 11/19/16 0418 11/19/16 0804  GLUCAP 208* 60* 208* 152* 58*    Radiology Studies: Dg Ercp Biliary & Pancreatic Ducts  Result Date: 11/17/2016 CLINICAL DATA:  Bile leak with stent placement and sphincterotomy. EXAM: ERCP TECHNIQUE: Multiple spot images obtained with the fluoroscopic device and submitted for interpretation post-procedure. FLUOROSCOPY TIME:  Fluoroscopy Time:  19 minutes and 42 seconds Number of Acquired Spot Images: 8 COMPARISON:  Abdominal CT 11/16/2016. Hepatobiliary examination 11/16/2016 FINDINGS: First set of images demonstrate cannulation and opacification of the main pancreatic duct. The biliary system was cannulated and opacified with contrast. Final image demonstrates a catheter or stent in the common bile duct. Limited evaluation for contrast extravasation. IMPRESSION: Cannulation and opacification of the biliary system for sphincterotomy and stent placement. These images were submitted for radiologic interpretation only. Please see the procedural report for the amount of contrast and the fluoroscopy time utilized. Electronically Signed   By: Markus Daft M.D.   On: 11/17/2016 14:59     Medications:  Scheduled: . atorvastatin  40 mg Oral q1800  . cholecalciferol  2,000 Units Oral Daily  . enoxaparin (LOVENOX) injection  40 mg Subcutaneous QHS  . feeding supplement (ENSURE ENLIVE)  237 mL Oral BID  . gabapentin  400 mg Oral TID  . insulin aspart  0-9 Units Subcutaneous Q4H  . insulin glargine  8 Units Subcutaneous QHS  . latanoprost  1 drop Both Eyes QHS  . metoprolol succinate  100 mg Oral Daily  . multivitamin with minerals  1 tablet Oral Daily  . pantoprazole  40 mg Oral BID  . piperacillin-tazobactam (ZOSYN)  IV  3.375 g Intravenous Q8H  . senna  1 tablet Oral BID  . tamsulosin  0.4 mg Oral QPC breakfast    Continuous: . lactated ringers 50 mL/hr at 11/18/16 [id number]   [id number] **OR** acetaminophen, bisacodyl, fentaNYL (SUBLIMAZE) injection, HYDROmorphone (DILAUDID) injection, magic mouthwash, ondansetron (ZOFRAN) IV, oxyCODONE-acetaminophen, polyethylene glycol  Assessment/Plan:  Principal Problem:   Biliary obstruction Active Problems:   Hypertension   Type 2 diabetes, uncontrolled, with peripheral circulatory disorder (HCC)   CAD (coronary artery disease)   Bile leak, postoperative   Malnutrition of moderate degree   Elevated LFTs    Biliary obstruction/Bilioma/concern for cholangitis Patient status post cholecystectomy on 1/24 at [place]. Presented with abdominal pain and found to have elevated alkaline phosphatase and bilirubin. Transferred to [facility] for further workup. Seen by gastroenterology. CT scan showed a fluid collection at the operative site, possible bile leak. Underwent ERCP and underwent sphincterotomy and biliary stent placement. Also noted to have high-grade duodenal stenosis. LFTs are improving. Patient remains on  Zosyn. WBC has improved. Seen by general surgery. No plans for any drainage of the fluid collection at this time. Antibiotics for 5 more days. Diet being advanced today.  Duodenal stenosis Noted on ERCP. PPI. Diet being advanced today.  History of CAD, PAD  Seems to be stable. Continue home medications. Aspirin and cilostazol held for procedure.  Insulin-dependent DM  A1c was 7.6% in December 2017. Managed at home with metformin, Januvia, glipizide, and Humalog 75/25 6 units qAM and 8 units qPM; these are held on admission. Continue to monitor CBGs. Continue Lantus.  Essential Hypertension  Monitor blood pressures. Continue with Toprol.   DVT Prophylaxis: Lovenox    Code Status: Full code  Family Communication: Discussed with the patient Disposition Plan: Management as outlined above. Diet to be advanced today. Anticipate discharge in  the next 1-2 days once cleared by general surgery. Mobilize.    LOS: 4 days   [facility] Pager [phone] 11/19/2016, 10:18 AM  If 7PM-7AM, please contact night-coverage at [email or web address], password [facility]

## 2016-11-19 NOTE — Progress Notes (Signed)
PRN nurse on floor. Assisting nurse administer medications to patient.

## 2016-11-19 NOTE — Progress Notes (Signed)
Daily Rounding Note  11/19/2016, 8:52 AM  LOS: 4 days   SUBJECTIVE:   Chief complaint: epigastric abd pain.  This is better but persists and he continues to make use of Dilaudid and percocet.  No nausea, no vomiting on full liquids.  "Good sized" BM yesterday.  Passing flatus.  Some cough but no dyspnea.  Walked halls and "climbed 17 steps" with PT yesterday.    OBJECTIVE:         Vital signs in last 24 hours:    Temp:  [97.7 F (36.5 C)-98.6 F (37 C)] 98.6 F (37 C) (01/30 0826) Pulse Rate:  [80-82] 82 (01/30 0826) Resp:  [16-18] 18 (01/30 0826) BP: (130-158)/(51-65) 158/64 (01/30 0826) SpO2:  [93 %-97 %] 93 % (01/30 0826) Weight:  [77.8 kg (171 lb 8.3 oz)] 77.8 kg (171 lb 8.3 oz) (01/30 0419) Last BM Date: 11/18/16 Filed Weights   11/17/16 0437 11/18/16 0511 11/19/16 0419  Weight: 79.7 kg (175 lb 10.6 oz) 78.3 kg (172 lb 11.3 oz) 77.8 kg (171 lb 8.3 oz)   General: pleasant, frail, still looks ill.  Comfortable and fully alert   Heart: RRR Chest: dry rales in bases, this is improved.  Moderately loose cough.   Abdomen: distended, softer but still moderately tense.  BS active, none tinkling or high-pitched.  Tenderness in diffuse and mild to moderate without G/R.  Extremities: no CCE Neuro/Psych:  Oriented x 3.  No limb weakness or tremor.   Intake/Output from previous day: 01/29 0701 - 01/30 0700 In: 1559.2 [P.O.:720; I.V.:489.2; IV Piggyback:350] Out: 975 [Urine:975]  Intake/Output this shift: No intake/output data recorded.  Lab Results:  Recent Labs  11/17/16 0523 11/18/16 0523 11/19/16 0542  WBC 9.1 7.2 6.9  HGB 10.3* 9.8* 9.1*  HCT 33.6* 31.8* 29.1*  PLT 244 229 230   BMET  Recent Labs  11/17/16 0523 11/18/16 0523 11/19/16 0542  NA 135 139 139  K 3.4* 3.5 3.3*  CL 102 106 104  CO2 25 27 26   GLUCOSE 122* 130* 140*  BUN 11 12 9   CREATININE 0.97 0.95 0.86  CALCIUM 8.4* 8.1* 7.9*    LFT  Recent Labs  11/17/16 0523 11/18/16 0523 11/19/16 0542  PROT 5.4* 4.9* 5.0*  ALBUMIN 2.1* 1.9* 1.8*  AST 24 20 21   ALT 75* 50 40  ALKPHOS 160* 145* 154*  BILITOT 1.7* 1.7* 0.8    Studies/Results: Dg Ercp Biliary & Pancreatic Ducts  Result Date: 11/17/2016 CLINICAL DATA:  Bile leak with stent placement and sphincterotomy. EXAM: ERCP TECHNIQUE: Multiple spot images obtained with the fluoroscopic device and submitted for interpretation post-procedure. FLUOROSCOPY TIME:  Fluoroscopy Time:  19 minutes and 42 seconds Number of Acquired Spot Images: 8 COMPARISON:  Abdominal CT 11/16/2016. Hepatobiliary examination 11/16/2016 FINDINGS: First set of images demonstrate cannulation and opacification of the main pancreatic duct. The biliary system was cannulated and opacified with contrast. Final image demonstrates a catheter or stent in the common bile duct. Limited evaluation for contrast extravasation. IMPRESSION: Cannulation and opacification of the biliary system for sphincterotomy and stent placement. These images were submitted for radiologic interpretation only. Please see the procedural report for the amount of contrast and the fluoroscopy time utilized. Electronically Signed   By: Markus Daft M.D.   On: 11/17/2016 14:59   Scheduled Meds: . atorvastatin  40 mg Oral q1800  . cholecalciferol  2,000 Units Oral Daily  . enoxaparin (LOVENOX) injection  40 mg  Subcutaneous QHS  . feeding supplement (ENSURE ENLIVE)  237 mL Oral BID  . gabapentin  400 mg Oral TID  . insulin aspart  0-9 Units Subcutaneous Q4H  . insulin glargine  8 Units Subcutaneous QHS  . latanoprost  1 drop Both Eyes QHS  . metoprolol succinate  100 mg Oral Daily  . multivitamin with minerals  1 tablet Oral Daily  . pantoprazole  40 mg Oral BID  . piperacillin-tazobactam (ZOSYN)  IV  3.375 g Intravenous Q8H  . senna  1 tablet Oral BID  . tamsulosin  0.4 mg Oral QPC breakfast   Continuous Infusions: . lactated  ringers 50 mL/hr at 11/18/16 0836   PRN Meds:.acetaminophen **OR** acetaminophen, bisacodyl, fentaNYL (SUBLIMAZE) injection, HYDROmorphone (DILAUDID) injection, magic mouthwash, ondansetron (ZOFRAN) IV, oxyCODONE-acetaminophen, polyethylene glycol   ASSESMENT:   *   Hx acute cholecystitis.   10/06/16 perc biliary drain. 11/13/16 lap chole with sugg of CBD stone per [facility] 11/18/16 ERCP with post op bile leak, sphincterotomy, insertion of biliary stent, no CBD stones.  High grade duodenal stenosis made for challenging but ultimately successful ERCP.   CT and HIDA scans confirm biloma.  LFTs have normalized.  Zosyn day 5 Surgery has not felt perc drain is imminent as biloma too small to drain.  No opinion yet from IR.    *  Hypokalemia.    *  Protein cal malnutrition.  BID Ensure and full liquids in place.  Tolerating full liquids. RD following.   *  IDDM  *  Hypocalcemia.  Measures 9.6 (normal) when corrected for hypoalbuminemia   PLAN   *  Carb mod diet.  No need to repeat CT scan yet as clinically improving.    Azucena Freed  11/19/2016, 8:52 AM Pager: [phone]    Attending physician's note   I have taken an interval history, reviewed the chart and examined the patient. I agree with the Advanced Practitioner's note, impression and recommendations.  * Bile leak and biliary dilation post lap chole. ERCP with sphinctertomy and biliary stent placement. LFTs improved. Overall improved. Tolerating diet. Advance diet. * GB fossa fluid collection not felt to need drainage by general surgery.  * Duodenal stricture, continue PPI qam long term.  * Anemia without GI bleeding. * OK for discharge when pain is adequately controlled with PO medications.  * Continue antibiotics for another 5 days  * GI follow up with Dr. Scarlette Shorts in 1 month.  * Gen surgical follow up with Dr. Hampton Abbot in 1-2 weeks. * GI signing off.   Pricilla Riffle. [place], [place] [phone]

## 2016-11-19 NOTE — Progress Notes (Signed)
2 Days Post-Op  Subjective: Still distended but not to uncomfortable. Upon the bedside toilet currently.  GI has advanced his diet.  Objective: Vital signs in last 24 hours: Temp:  [97.7 F (36.5 C)-98.6 F (37 C)] 98.6 F (37 C) (01/30 0826) Pulse Rate:  [80-82] 82 (01/30 0826) Resp:  [16-18] 18 (01/30 0826) BP: (130-158)/(51-65) 158/64 (01/30 0826) SpO2:  [93 %-97 %] 93 % (01/30 0826) Weight:  [77.8 kg (171 lb 8.3 oz)] 77.8 kg (171 lb 8.3 oz) (01/30 0419) Last BM Date: 11/18/16 720 PO 800 IV Urine 975 recorded Afebrile, VSS K+ 3.3 H/H trending down some Intake/Output from previous day: 01/29 0701 - 01/30 0700 In: 1559.2 [P.O.:720; I.V.:489.2; IV Piggyback:350] Out: 975 [Urine:975] Intake/Output this shift: Total I/O In: 120 [P.O.:120] Out: -   General appearance: alert, cooperative and no distress GI: Distended, passing flatus, not really tender, just sore.  Lab Results:   Recent Labs  11/18/16 0523 11/19/16 0542  WBC 7.2 6.9  HGB 9.8* 9.1*  HCT 31.8* 29.1*  PLT 229 230    BMET  Recent Labs  11/18/16 0523 11/19/16 0542  NA 139 139  K 3.5 3.3*  CL 106 104  CO2 27 26  GLUCOSE 130* 140*  BUN 12 9  CREATININE 0.95 0.86  CALCIUM 8.1* 7.9*   PT/INR No results for input(s): LABPROT, INR in the last 72 hours.   Recent Labs Lab 11/15/16 0958 11/16/16 0725 11/17/16 0523 11/18/16 0523 11/19/16 0542  AST 162* 37 24 20 21   ALT 247* 115* 75* 50 40  ALKPHOS 341* 197* 160* 145* 154*  BILITOT 3.8* 2.0* 1.7* 1.7* 0.8  PROT 7.6 5.5* 5.4* 4.9* 5.0*  ALBUMIN 3.8 2.2* 2.1* 1.9* 1.8*     Lipase     Component Value Date/Time   LIPASE 55 (H) 11/15/2016 0958     Studies/Results: Dg Ercp Biliary & Pancreatic Ducts  Result Date: 11/17/2016 CLINICAL DATA:  Bile leak with stent placement and sphincterotomy. EXAM: ERCP TECHNIQUE: Multiple spot images obtained with the fluoroscopic device and submitted for interpretation post-procedure. FLUOROSCOPY TIME:   Fluoroscopy Time:  19 minutes and 42 seconds Number of Acquired Spot Images: 8 COMPARISON:  Abdominal CT 11/16/2016. Hepatobiliary examination 11/16/2016 FINDINGS: First set of images demonstrate cannulation and opacification of the main pancreatic duct. The biliary system was cannulated and opacified with contrast. Final image demonstrates a catheter or stent in the common bile duct. Limited evaluation for contrast extravasation. IMPRESSION: Cannulation and opacification of the biliary system for sphincterotomy and stent placement. These images were submitted for radiologic interpretation only. Please see the procedural report for the amount of contrast and the fluoroscopy time utilized. Electronically Signed   By: Markus Daft M.D.   On: 11/17/2016 14:59    Medications: . atorvastatin  40 mg Oral q1800  . cholecalciferol  2,000 Units Oral Daily  . enoxaparin (LOVENOX) injection  40 mg Subcutaneous QHS  . feeding supplement (ENSURE ENLIVE)  237 mL Oral BID  . gabapentin  400 mg Oral TID  . insulin aspart  0-9 Units Subcutaneous Q4H  . insulin glargine  8 Units Subcutaneous QHS  . latanoprost  1 drop Both Eyes QHS  . metoprolol succinate  100 mg Oral Daily  . multivitamin with minerals  1 tablet Oral Daily  . pantoprazole  40 mg Oral BID  . piperacillin-tazobactam (ZOSYN)  IV  3.375 g Intravenous Q8H  . senna  1 tablet Oral BID  . tamsulosin  0.4 mg Oral  QPC breakfast    Assessment/Plan Bile leak after laparoscopic cholecystectomy 124/18 Dr.Piscoya, [facility]. ERCP with post op bile leak, sphincterotomy, no CBD stones. High grade duodenal stenosis made for challenging but ultimately successful ERCP Status post percutaneous cholecystostomy 10/06/16. History of CVA Type 2 diabetes Hypertension Neuromuscular disorder Vascular occlusive disease status post PTCA, right SFA. FEN:  IV fluids/carb modified ID:  Zosyn 11/14/16 =>> day 5 DVT:  Lovenox   Plan: Continued  medical management.    LOS: 4 days    Henry Lindsey 11/19/2016 [phone]

## 2016-11-19 NOTE — Care Management Important Message (Signed)
Important Message  Patient Details  Name: Henry Lindsey MRN: [id number] Date of Birth: 1933-02-27   Medicare Important Message Given:  Yes    Victorious Kundinger 11/19/2016, 12:08 PM

## 2016-11-19 NOTE — Progress Notes (Signed)
PT Cancellation Note  Patient Details Name: KEYNAN MATHENY MRN: [id number] DOB: 1933-09-02   Cancelled Treatment:    Reason Eval/Treat Not Completed: Other (comment) ("I need a nap."  Will try again later if time allows. Thx. )   Godfrey Pick Xee Hollman 11/19/2016, 11:44 AM  [facility] [phone] (pager)

## 2016-11-19 NOTE — Progress Notes (Signed)
Physical Therapy Treatment Patient Details Name: Henry Lindsey MRN: [id number] DOB: [birth year] Today's Date: 11/19/2016    History of Present Illness Mahyar initially had cholecystitis last Thanksgiving. Due to his medical issues, a perc chole drain was placed. He improved medically and underwent lap chole 1/24 by Dr. Hampton Abbot at [facility]. He was discharged from their 1/25. He returned to the emergency room there 1/26 complaining of abdominal pain. Evaluation was concerning for postoperative bile leak. He was transferred to the hospitalist service at [facility]. He underwent hida scan confirming bile leak. He underwent ERCP with stent placement today by Dr. Scarlette Shorts.    PT Comments    Pt agreeable to walk, almost made it around the whole unit holding the IV pole for support, occasional stopping due to pain in his abdomen and slow, guarded speed.  He is having quite a bit of gas while walking, but no success having a BM (multiple attempts) on BSC.  He would continue to benefit from PT acutely, but will likely not need f/u therapy after d/c.    Follow Up Recommendations  No PT follow up;Supervision/Assistance - 24 hour     Equipment Recommendations  None recommended by PT    Recommendations for Other Services   NA     Precautions / Restrictions Precautions Precautions: Fall    Mobility  Bed Mobility Overal bed mobility: Modified Independent             General bed mobility comments: using bedrails for leverage  Transfers Overall transfer level: Independent Equipment used: None                Ambulation/Gait Ambulation/Gait assistance: Supervision Ambulation Distance (Feet): 350 Feet Assistive device:  (IV pole) Gait Pattern/deviations: Step-through pattern;Staggering right;Staggering left Gait velocity: decreased Gait velocity interpretation: Below normal speed for age/gender General Gait Details: Pt leaning with both hands on IV pole during gait.  Mildly staggering  gait pattern, Pt does report he can use his RW for a few days for stability if needed once home.  Slow, guarded speed and occational stopping due to pain in his abdomen, gas while walking, but no successful BM (tired twice while I was in the room, just gas).           Balance Overall balance assessment: Needs assistance Sitting-balance support: Feet supported;No upper extremity supported Sitting balance-Leahy Scale: Good     Standing balance support: Single extremity supported;Bilateral upper extremity supported;No upper extremity supported Standing balance-Leahy Scale: Fair                      Cognition Arousal/Alertness: Awake/alert Behavior During Therapy: WFL for tasks assessed/performed Overall Cognitive Status: Within Functional Limits for tasks assessed                             Pertinent Vitals/Pain Pain Assessment: 0-10 Pain Score: 7  Pain Location: abdominal pain  Pain Descriptors / Indicators: Aching;Burning Pain Intervention(s): Limited activity within patient's tolerance;Monitored during session;Repositioned;RN gave pain meds during session    [place] expects to be discharged to:: Private residence Living Arrangements: Children;Spouse/significant other                      PT Goals (current goals can now be found in the care plan section) Acute Rehab PT Goals Patient Stated Goal: to go home Progress towards PT goals: Progressing toward goals  Frequency    Min 3X/week      PT Plan Current plan remains appropriate       End of Session   Activity Tolerance: Patient limited by pain Patient left: in chair;with call bell/phone within reach     Time: 1535-1601 PT Time Calculation (min) (ACUTE ONLY): 26 min  Charges:  $Gait Training: 8-22 mins $Therapeutic Activity: 8-22 mins                      Shateria Paternostro B. Fairchild, [place], DPT [phone]   11/19/2016, 5:30 PM

## 2016-11-19 NOTE — Progress Notes (Addendum)
Inpatient Diabetes Program Recommendations  AACE/ADA: New Consensus Statement on Inpatient Glycemic Control (2015)  Target Ranges:  Prepandial:   less than 140 mg/dL      Peak postprandial:   less than 180 mg/dL (1-2 hours)      Critically ill patients:  140 - 180 mg/dL   Lab Results  Component Value Date   GLUCAP 137 (H) 11/19/2016   HGBA1C 7.6 (H) 10/05/2016   Results for LERAY, GRUMBINE (MRN [id number]) as of 11/19/2016 11:10  Ref. Range 11/18/2016 07:59 11/18/2016 12:06 11/18/2016 16:21 11/18/2016 19:51 11/18/2016 23:56 11/19/2016 04:18 11/19/2016 08:04  Glucose-Capillary Latest Ref Range: 65 - 99 mg/dL 157 (H) 224 (H) 208 (H) 203 (H) 208 (H) 152 (H) 137 (H)   Current orders for Inpatient glycemic control: Novolog 0-9 units Q4H, Lantus 8 units QHS  Inpatient Diabetes Program Recommendations, please consider:     Meal coverage of Novolog 3 units TIDAC if patient eats > 50% of meal     Changing frequency of sensitive correction scale to Novolog 0-9 units TIDAC and 0-5 units QHS;        Changing supplement to Glucerna TID.  Thank you,  Windy Carina, RN, MSN Diabetes Coordinator Inpatient Diabetes Program [phone] (Team Pager)

## 2016-11-20 ENCOUNTER — Inpatient Hospital Stay (HOSPITAL_COMMUNITY): Payer: Medicare Other

## 2016-11-20 DIAGNOSIS — E440 Moderate protein-calorie malnutrition: Secondary | ICD-10-CM

## 2016-11-20 LAB — COMPREHENSIVE METABOLIC PANEL
ALK PHOS: 183 U/L — AB (ref 38–126)
ALT: 40 U/L (ref 17–63)
AST: 29 U/L (ref 15–41)
Albumin: 1.9 g/dL — ABNORMAL LOW (ref 3.5–5.0)
Anion gap: 9 (ref 5–15)
BILIRUBIN TOTAL: 1 mg/dL (ref 0.3–1.2)
BUN: 9 mg/dL (ref 6–20)
CALCIUM: 8.1 mg/dL — AB (ref 8.9–10.3)
CO2: 26 mmol/L (ref 22–32)
CREATININE: 0.84 mg/dL (ref 0.61–1.24)
Chloride: 102 mmol/L (ref 101–111)
GFR calc non Af Amer: >60 mL/min (ref >60)
GLUCOSE: 175 mg/dL — AB (ref 65–99)
Potassium: 3.8 mmol/L (ref 3.5–5.1)
SODIUM: 137 mmol/L (ref 135–145)
TOTAL PROTEIN: 5.1 g/dL — AB (ref 6.5–8.1)

## 2016-11-20 LAB — CBC
HEMATOCRIT: 29.6 % — AB (ref 39.0–52.0)
HEMOGLOBIN: 9.2 g/dL — AB (ref 13.0–17.0)
MCH: 26.4 pg (ref 26.0–34.0)
MCHC: 31.1 g/dL (ref 30.0–36.0)
MCV: 85.1 fL (ref 78.0–100.0)
Platelets: 249 10*3/uL (ref 150–400)
RBC: 3.48 MIL/uL — AB (ref 4.22–5.81)
RDW: 14.7 % (ref 11.5–15.5)
WBC: 9.4 10*3/uL (ref 4.0–10.5)

## 2016-11-20 LAB — GLUCOSE, CAPILLARY
GLUCOSE-CAPILLARY: 143 mg/dL — AB (ref 65–99)
GLUCOSE-CAPILLARY: 177 mg/dL — AB (ref 65–99)
GLUCOSE-CAPILLARY: 155 mg/dL — AB (ref 65–99)
Glucose-Capillary: 124 mg/dL — ABNORMAL HIGH (ref 65–99)
Glucose-Capillary: 229 mg/dL — ABNORMAL HIGH (ref 65–99)

## 2016-11-20 LAB — LIPASE, BLOOD: LIPASE: 33 U/L (ref 11–51)

## 2016-11-20 LAB — MAGNESIUM: Magnesium: 1.8 mg/dL (ref 1.7–2.4)

## 2016-11-20 MED ORDER — IOPAMIDOL (ISOVUE-300) INJECTION 61%
15.0000 mL | INTRAVENOUS | Status: AC
  Administered 2016-11-20 (×2): 15 mL via ORAL

## 2016-11-20 MED ORDER — SACCHAROMYCES BOULARDII 250 MG PO CAPS
250.0000 mg | ORAL_CAPSULE | Freq: Two times a day (BID) | ORAL | Status: DC
  Administered 2016-11-20 – 2016-11-24 (×9): 250 mg via ORAL
  Filled 2016-11-20 (×9): qty 1

## 2016-11-20 MED ORDER — IOPAMIDOL (ISOVUE-300) INJECTION 61%
INTRAVENOUS | Status: AC
  Administered 2016-11-20: 100 mL
  Filled 2016-11-20: qty 100

## 2016-11-20 MED ORDER — BISACODYL 10 MG RE SUPP
10.0000 mg | Freq: Once | RECTAL | Status: AC
  Administered 2016-11-20: 10 mg via RECTAL
  Filled 2016-11-20: qty 1

## 2016-11-20 NOTE — Progress Notes (Signed)
3 Days Post-Op  Subjective: Pt reported allot of pain last PM, labs and film unremarkable  Objective: Vital signs in last 24 hours: Temp:  [98 F (36.7 C)-99.5 F (37.5 C)] 98 F (36.7 C) (01/31 0429) Pulse Rate:  [70-82] 79 (01/31 0429) Resp:  [16-18] 18 (01/31 0429) BP: (140-158)/(64-80) 158/65 (01/31 0429) SpO2:  [93 %-97 %] 95 % (01/31 0429) Weight:  [76.7 kg (169 lb)] 76.7 kg (169 lb) (01/31 0553) Last BM Date: 11/19/16 480 POrecorded 2575 urine Afebrile, VSS Labs OK Film this AM:  Biliary stent noted. Abdominal gas pattern is negative for bowel obstruction or perforation.   Intake/Output from previous day: 01/30 0701 - 01/31 0700 In: 1308.2 [P.O.:480; I.V.:678.2; IV Piggyback:150] Out: 2575 [Urine:2575] Intake/Output this shift: No intake/output data recorded.  General appearance: alert, cooperative, no distress and up in chair, seems comfortable eating breakfast with pancakes and eggs. GI: Abdomen is distended. He is not tender to palpation. Port sites all look good. He says it hurts intermittently.  Lab Results:   Recent Labs  11/19/16 0542 11/20/16 0211  WBC 6.9 9.4  HGB 9.1* 9.2*  HCT 29.1* 29.6*  PLT 230 249    BMET  Recent Labs  11/19/16 0542 11/20/16 0211  NA 139 137  K 3.3* 3.8  CL 104 102  CO2 26 26  GLUCOSE 140* 175*  BUN 9 9  CREATININE 0.86 0.84  CALCIUM 7.9* 8.1*   PT/INR No results for input(s): LABPROT, INR in the last 72 hours.   Recent Labs Lab 11/16/16 0725 11/17/16 0523 11/18/16 0523 11/19/16 0542 11/20/16 0211  AST 37 24 20 21 29   ALT 115* 75* 50 40 40  ALKPHOS 197* 160* 145* 154* 183*  BILITOT 2.0* 1.7* 1.7* 0.8 1.0  PROT 5.5* 5.4* 4.9* 5.0* 5.1*  ALBUMIN 2.2* 2.1* 1.9* 1.8* 1.9*     Lipase     Component Value Date/Time   LIPASE 55 (H) 11/15/2016 0958     Studies/Results: Dg Abd Portable 1v  Result Date: 11/20/2016 CLINICAL DATA:  Abdominal pain and distention. EXAM: PORTABLE ABDOMEN - 1 VIEW  COMPARISON:  CT 11/16/2016 FINDINGS: Biliary stent noted. Abdominal gas pattern is negative for bowel obstruction or perforation. IMPRESSION: Normal abdominal gas pattern.  Biliary stent is visible. Electronically Signed   By: Andreas Newport M.D.   On: 11/20/2016 02:18    Medications: . atorvastatin  40 mg Oral q1800  . cholecalciferol  2,000 Units Oral Daily  . enoxaparin (LOVENOX) injection  40 mg Subcutaneous QHS  . feeding supplement (ENSURE ENLIVE)  237 mL Oral BID  . gabapentin  400 mg Oral TID  . insulin aspart  0-9 Units Subcutaneous Q4H  . insulin glargine  8 Units Subcutaneous QHS  . latanoprost  1 drop Both Eyes QHS  . metoprolol succinate  100 mg Oral Daily  . multivitamin with minerals  1 tablet Oral Daily  . pantoprazole  40 mg Oral BID  . piperacillin-tazobactam (ZOSYN)  IV  3.375 g Intravenous Q8H  . senna  1 tablet Oral BID  . tamsulosin  0.4 mg Oral QPC breakfast    Assessment/Plan Bile leak after laparoscopic cholecystectomy 11/13/16 Dr.Piscoya, [facility]. ERCP with post op bile leak, sphincterotomy, no CBD stones. High grade duodenal stenosis made for challenging but ultimately successful ERCP 11/17/16 Status post percutaneous cholecystostomy 10/06/16. History of CVA Type 2 diabetes Hypertension Neuromuscular disorder Vascular occlusive disease status post PTCA, right SFA. FEN: IV fluids/carb modified ID: [email or web address] 11/14/16 =>>  day 6 DVT: Lovenox  Plan:  He only had a small BM yesterday we'll try Dulcolax suppository this a.m. Add probiotic. He is not tender on my exam this morning but remains very distended. We will get a CT to look for a drainable fluid collection. I briefly discussed with IR.      LOS: 5 days    Henry Lindsey 11/20/2016 [phone]

## 2016-11-20 NOTE — Progress Notes (Signed)
Ct reviewed Fluid collection still present in GB fossa Will make npo p mn except meds in case IR can aspirate/drain GB fossa biloma  Leighton Ruff. Redmond Pulling, MD, FACS General, Bariatric, & Minimally Invasive Surgery [facility], [place]

## 2016-11-20 NOTE — Progress Notes (Signed)
PROGRESS NOTE        PATIENT DETAILS Name: Henry Lindsey Age: 83 y.o. Sex: male Date of Birth: 01-24-1933 Admit Date: 11/15/2016 Admitting Physician Vianne Bulls, MD OY:[id number], Aris Everts, MD  Brief Narrative: Patient is a 83 y.o. male with history of hypertension, insulin-dependent diabetes, CAD who recently had acute cholecystitis and had a percutaneous drain placed initially, and subsequently underwent cholecystectomy at [facility] on 1/24, he presented on 1/26 with worsening abdominal pain. He was initially thought to have possible CBD obstruction, but upon further evaluation was found to have a biliary leak. He subsequently underwent ERCP with stent placement on 1/28. Hospital course has been complicated by persistent abdominal pain. See below for further details  Subjective: Had worsening abdominal pain and distention last night. Pain seems to be adequately controlled this morning.  Assessment/Plan: Abdominal pain due to biliary leak with a Biloma s/p recent cholecystectomy on 1/24: Underwent ERCP and stent placement on 1/28. Continues to have abdominal pain-lipase levels within normal limits. Gen. surgery following, plans are to repeat CT scan of the abdomen today. He remains on empiric Zosyn-potential stop date on 2/4.  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 Resume cilostazol once all procedures are complete.  Insulin-dependent type 2 diabetes: CBGs currently stable-continue 18 units of Lantus and SSI. Will resume home regimen of metformin, Januvia, glipizide and Humalog 75/25 6 units in a.m. and 8 units in p.m. on discharge  Hypertension: Controlled with metoprolol  Dyslipidemia: Continue statin  Malnutrition of moderate degree:  Continue supplements  DVT Prophylaxis: Prophylactic Lovenox  Code Status: Full code  Family Communication: None at bedside   Disposition Plan: Remain inpatient-home in 2-3 days  Antimicrobial agents: Anti-infectives    Start     Dose/Rate Route Frequency Ordered Stop   11/16/16 0600  piperacillin-tazobactam (ZOSYN) IVPB 3.375 g     3.375 g 12.5 mL/hr over 240 Minutes Intravenous Every 8 hours 11/15/16 2149     11/15/16 2200  piperacillin-tazobactam (ZOSYN) IVPB 3.375 g     3.375 g 100 mL/hr over 30 Minutes Intravenous  Once 11/15/16 2149 11/15/16 2329      Procedures: ERCP 1/28  CONSULTS:  GI and general surgery  Time spent: 25 minutes-Greater than 50% of this time was spent in counseling, explanation of diagnosis, planning of further management, and coordination of care.  MEDICATIONS: Scheduled Meds: . atorvastatin  40 mg Oral q1800  . cholecalciferol  2,000 Units Oral Daily  . enoxaparin (LOVENOX) injection  40 mg Subcutaneous QHS  . feeding supplement (ENSURE ENLIVE)  237 mL Oral BID  . gabapentin  400 mg Oral TID  . insulin aspart  0-9 Units Subcutaneous Q4H  . insulin glargine  8 Units Subcutaneous QHS  . latanoprost  1 drop Both Eyes QHS  . metoprolol succinate  100 mg Oral Daily  . multivitamin with minerals  1 tablet Oral Daily  . pantoprazole  40 mg Oral BID  . piperacillin-tazobactam (ZOSYN)  IV  3.375 g Intravenous Q8H  . saccharomyces boulardii  250 mg Oral BID  . senna  1 tablet Oral BID  . tamsulosin  0.4 mg Oral QPC breakfast  Continuous Infusions: . lactated ringers 50 mL/hr at 11/20/16 1258   PRN Meds:.acetaminophen **OR** acetaminophen, bisacodyl, fentaNYL (SUBLIMAZE) injection, HYDROmorphone (DILAUDID) injection, magic mouthwash, ondansetron (ZOFRAN) IV, oxyCODONE-acetaminophen, polyethylene glycol   PHYSICAL EXAM: Vital signs: Vitals:   11/20/16 0127 11/20/16 0429 11/20/16 0553 11/20/16 1243  BP: (!) 148/67 (!) 158/65  134/66    Pulse: 77 79  65  Resp: 17 18  18   Temp: 98.6 F (37 C) 98 F (36.7 C)  98.2 F (36.8 C)  TempSrc: Oral Oral  Oral  SpO2: 93% 95%  96%  Weight:   76.7 kg (169 lb)    Filed Weights   11/18/16 0511 11/19/16 0419 11/20/16 0553  Weight: 78.3 kg (172 lb 11.3 oz) 77.8 kg (171 lb 8.3 oz) 76.7 kg (169 lb)   Body mass index is 23.57 kg/m.   General appearance :Awake, alert, not in any distress. Speech Clear.  Eyes:, pupils equally reactive to light and accomodation,no scleral icterus.Pink conjunctiva HEENT: Atraumatic and Normocephalic Neck: supple, no JVD. No cervical lymphadenopathy. No thyromegaly Resp:Good air entry bilaterally, no added sounds  CVS: S1 S2 regular, no murmurs.  GI: Bowel sounds present,Slightly tender in the epigastric area, abdomen appears slightly distended. Extremities: B/L Lower Ext shows no edema, both legs are warm to touch Neurology:  speech clear,Non focal, sensation is grossly intact. Psychiatric: Normal judgment and insight. Alert and oriented x 3. Normal mood. Musculoskeletal:No digital cyanosis Skin:No Rash, warm and dry Wounds:N/A  I have personally reviewed following labs and imaging studies  LABORATORY DATA: CBC:  Recent Labs Lab 11/16/16 0725 11/17/16 0523 11/18/16 0523 11/19/16 0542 11/20/16 0211  WBC 12.0* 9.1 7.2 6.9 9.4  HGB 11.2* 10.3* 9.8* 9.1* 9.2*  HCT 36.3* 33.6* 31.8* 29.1* 29.6*  MCV 86.4 85.7 84.6 85.1 85.1  PLT 246 244 229 230 0000000    Basic Metabolic Panel:  Recent Labs Lab 11/16/16 0725 11/17/16 0523 11/18/16 0523 11/19/16 0542 11/20/16 0211  NA 139 135 139 139 137  K 3.7 3.4* 3.5 3.3* 3.8  CL 104 102 106 104 102  CO2 26 25 27 26 26   GLUCOSE 133* 122* 130* 140* 175*  BUN 9 11 12 9 9   CREATININE 0.89 0.97 0.95 0.86 0.84  CALCIUM 8.5* 8.4* 8.1* 7.9* 8.1*  MG  --   --   --   --  1.8    GFR: Estimated Creatinine Clearance: 73.5 mL/min (by C-G formula based on SCr of 0.84 mg/dL).  Liver Function  Tests:  Recent Labs Lab 11/16/16 0725 11/17/16 0523 11/18/16 0523 11/19/16 0542 11/20/16 0211  AST 37 24 20 21 29   ALT 115* 75* 50 40 40  ALKPHOS 197* 160* 145* 154* 183*  BILITOT 2.0* 1.7* 1.7* 0.8 1.0  PROT 5.5* 5.4* 4.9* 5.0* 5.1*  ALBUMIN 2.2* 2.1* 1.9* 1.8* 1.9*    Recent Labs Lab 11/15/16 0958 11/20/16 0834  LIPASE 55* 33   No results for input(s): AMMONIA in the last 168 hours.  Coagulation Profile:  Recent Labs Lab 11/16/16 0725  INR 1.37    Cardiac Enzymes:  Recent Labs Lab 11/15/16 0958  TROPONINI <0.03    BNP (last 3 results) No results for input(s): PROBNP in the last 8760 hours.  HbA1C: No results for input(s): HGBA1C in the last 72 hours.  CBG:  Recent Labs Lab 11/19/16 2040 11/19/16 2345 11/20/16 0423 11/20/16 0752 11/20/16 1209  GLUCAP 126* 222* 143* 124* 177*    Lipid Profile: No results for input(s): CHOL, HDL,  LDLCALC, TRIG, CHOLHDL, LDLDIRECT in the last 72 hours.  Thyroid Function Tests: No results for input(s): TSH, T4TOTAL, FREET4, T3FREE, THYROIDAB in the last 72 hours.  Anemia Panel: No results for input(s): VITAMINB12, FOLATE, FERRITIN, TIBC, IRON, RETICCTPCT in the last 72 hours.  Urine analysis:    Component Value Date/Time   COLORURINE YELLOW 11/15/2016 0958   APPEARANCEUR CLEAR 11/15/2016 0958   APPEARANCEUR Clear 08/17/2012 1505   LABSPEC 1.020 11/15/2016 0958   LABSPEC 1.017 08/17/2012 1505   PHURINE 7.0 11/15/2016 0958   GLUCOSEU 500 (A) 11/15/2016 0958   GLUCOSEU 50 mg/dL 08/17/2012 1505   HGBUR MODERATE (A) 11/15/2016 0958   BILIRUBINUR SMALL (A) 11/15/2016 0958   BILIRUBINUR neg 10/07/2014 1444   BILIRUBINUR Negative 08/17/2012 1505   KETONESUR 15 (A) 11/15/2016 0958   PROTEINUR 100 (A) 11/15/2016 0958   UROBILINOGEN 0.2 10/07/2014 1444   NITRITE NEGATIVE 11/15/2016 0958   LEUKOCYTESUR NEGATIVE 11/15/2016 0958   LEUKOCYTESUR Negative 08/17/2012 1505    Sepsis Labs: Lactic Acid, Venous     Component Value Date/Time   LATICACIDVEN 1.5 10/21/2016 0036    MICROBIOLOGY: No results found for this or any previous visit (from the past 240 hour(s)).  RADIOLOGY STUDIES/RESULTS: Dg Cholangiogram Operative  Result Date: 11/13/2016 CLINICAL DATA:  Cholecystectomy. History of percutaneous cholecystostomy tube. EXAM: INTRAOPERATIVE CHOLANGIOGRAM TECHNIQUE: Cholangiographic images from the C-arm fluoroscopic device were submitted for interpretation post-operatively. Please see the procedural report for the amount of contrast and the fluoroscopy time utilized. COMPARISON:  Cholecystostomy tube injection 10/29/2016 FINDINGS: Opacification of the intrahepatic and extrahepatic biliary system. There is moderate dilatation of the biliary system. Question minimal drainage into the duodenum. There appears to be extravasation of contrast near the injection site. Cannot exclude sludge within the common bile duct. IMPRESSION: Dilatation of the biliary system without significant drainage into the duodenum. Question minimal filling of the duodenum. Findings are concerning for narrowing or obstruction in the distal common bile duct. Electronically Signed   By: Markus Daft M.D.   On: 11/13/2016 14:34   Nm Hepatobiliary Liver Func  Result Date: 11/17/2016 CLINICAL DATA:  83 year old male status post cholecystectomy. Evaluate for bile leak. EXAM: NUCLEAR MEDICINE HEPATOBILIARY IMAGING TECHNIQUE: Sequential images of the abdomen were obtained out to 60 minutes following intravenous administration of radiopharmaceutical. RADIOPHARMACEUTICALS:  5.15 mCi Tc-6m  Choletec IV COMPARISON:  Abdominal CT dated 11/16/2016 FINDINGS: Prompt uptake and biliary excretion of activity by the liver is seen. There is accumulation of radiotracer outside of the biliary tree corresponding to the fluid collection seen on the CT within the gallbladder fossa as well as sacculation of the radiopharmaceutical within the fluid collection along  the inferior surface of the left lobe of the liver. Findings most consistent with contained bite leak or biloma. No extension of radiotracer noted inferiorly within the abdomen. IMPRESSION: Extra-biliary collection of radiopharmaceutical in the gallbladder fossa and along the inferior surface of the left lobe of the liver corresponding to the fluid collections seen on the CT and most consistent with contained bite leak/biloma. Electronically Signed   By: Anner Crete M.D.   On: 11/17/2016 00:41   Ct Abdomen Pelvis W Contrast  Result Date: 11/16/2016 CLINICAL DATA:  Patient with recent cholecystectomy 3 days prior. Dilated intrahepatic and extrahepatic bile ducts. Evaluate for biloma. EXAM: CT ABDOMEN AND PELVIS WITH CONTRAST TECHNIQUE: Multidetector CT imaging of the abdomen and pelvis was performed using the standard protocol following bolus administration of intravenous contrast. CONTRAST:  19mL ISOVUE-300 IOPAMIDOL (ISOVUE-300)  INJECTION 61% COMPARISON:  CT abdomen pelvis 10/21/2016. FINDINGS: Lower chest: Normal heart size. Coronary arterial vascular calcifications. Small layering bilateral pleural effusions. Subpleural ground-glass and consolidative opacities within the lower lobes bilaterally. Hepatobiliary: Liver is normal in size and contour. Postsurgical changes compatible with interval cholecystectomy. There is flocculent fluid demonstrated within the gallbladder fossa. There are a few small foci of gas within the gallbladder fossa. Additionally, there is fluid extending from the gallbladder fossa into the gastrohepatic ligament and about the liver. Additionally fluid extends within the left upper quadrant around the spleen. Mild intrahepatic and extrahepatic biliary ductal dilatation. Pancreas: Mild pancreatic atrophy. Spleen: Unremarkable. Adrenals/Urinary Tract: The adrenal glands are normal. 1.5 cm cyst superior pole right kidney, 1.5 cm cyst inferior pole right kidney and 1.9 cm cyst  interpolar region left kidney. There is a 1.5 cm cyst within the superior pole left kidney. Urinary bladder is unremarkable. Stomach/Bowel: Sigmoid colonic diverticulosis. Small amount of free fluid in the pelvis. Free intraperitoneal air within the upper abdomen. Normal appendix. There a few mildly distended loops of small bowel within the central abdomen. Normal morphology of the stomach. Vascular/Lymphatic: Peripheral calcified atherosclerotic plaque. Retroaortic left renal vein. No retroperitoneal lymphadenopathy. Reproductive: Prostate unremarkable. Other: Small bilateral fat containing inguinal hernias, left-greater-than-right. Musculoskeletal: Multilevel degenerative disc disease. Grade 1 anterolisthesis L4 on L5. IMPRESSION: Postsurgical changes compatible with interval cholecystectomy. Flocculent fluid within the gallbladder fossa likely secondary to postsurgical change/ surgical material. Recommend correlation with operative history. Additionally, there is fluid extending from the cholecystectomy site into the central abdomen and left upper quadrant which is nonspecific however may be secondary to a bile leak. Recommend correlation with HIDA scan. Small amount of free intraperitoneal air within the upper abdomen likely reflective of recent postoperative state. Mild intrahepatic and extrahepatic biliary ductal dilatation. Aortic atherosclerosis. Mild distention of the small bowel centrally likely secondary to ileus. Small layering bilateral pleural effusions with underlying ground-glass consolidative opacities favored to represent atelectasis. Infection not excluded. These results will be called to the ordering clinician or representative by the Radiologist Assistant, and communication documented in the PACS or zVision Dashboard. Electronically Signed   By: Lovey Newcomer M.D.   On: 11/16/2016 20:04   Dg Ercp Biliary & Pancreatic Ducts  Result Date: 11/17/2016 CLINICAL DATA:  Bile leak with stent placement  and sphincterotomy. EXAM: ERCP TECHNIQUE: Multiple spot images obtained with the fluoroscopic device and submitted for interpretation post-procedure. FLUOROSCOPY TIME:  Fluoroscopy Time:  19 minutes and 42 seconds Number of Acquired Spot Images: 8 COMPARISON:  Abdominal CT 11/16/2016. Hepatobiliary examination 11/16/2016 FINDINGS: First set of images demonstrate cannulation and opacification of the main pancreatic duct. The biliary system was cannulated and opacified with contrast. Final image demonstrates a catheter or stent in the common bile duct. Limited evaluation for contrast extravasation. IMPRESSION: Cannulation and opacification of the biliary system for sphincterotomy and stent placement. These images were submitted for radiologic interpretation only. Please see the procedural report for the amount of contrast and the fluoroscopy time utilized. Electronically Signed   By: Markus Daft M.D.   On: 11/17/2016 14:59   Dg Abd Portable 1v  Result Date: 11/20/2016 CLINICAL DATA:  Abdominal pain and distention. EXAM: PORTABLE ABDOMEN - 1 VIEW COMPARISON:  CT 11/16/2016 FINDINGS: Biliary stent noted. Abdominal gas pattern is negative for bowel obstruction or perforation. IMPRESSION: Normal abdominal gas pattern.  Biliary stent is visible. Electronically Signed   By: Andreas Newport M.D.   On: 11/20/2016 02:18   Dg Cholangiogram  Existing Tube  Result Date: 10/29/2016 INDICATION: Cholecystostomy tube placed on October 05, 2016 ; here for evaluation prior to cholecystectomy. EXAM: CATHETER CHOLANGIOGRAM MEDICATIONS: No antibiotics were employed ANESTHESIA/SEDATION: No conscious sedation was employed. FLUOROSCOPY TIME:  Fluoroscopy Time: 1 minutes 6 seconds (178 mGy). COMPLICATIONS: None immediate. PROCEDURE: The cholecystostomy tube was accessed with a luer lock syringe. Under fluoroscopic guidance a total of 10 cc of a solution of Isovue 300 and sterile saline 1:3 was instilled. The gallbladder was  contracted with the catheter noted be intraluminal. No leakage of contrast outside the gallbladder was observed. There is was prompt filling of the cystic duct and common bile duct. The intrahepatic ducts partially filled. The contour of the common bile duct was normal with no filling defects observed. A tiny amount of drainage into the duodenum was observed at the conclusion of the study. 10 cc of contrast and bile were aspirated into the syringe at the conclusion of the study. The patient voiced no discomfort IMPRESSION: Injection of the cholecystostomy tube with filling of a portion of the gallbladder. The visualized portions of the gallbladder exhibit no inflammatory changes. Normal appearance of the cystic duct and common bile duct with no intraductal stones observed. Electronically Signed   By: David  Martinique M.D.   On: 10/29/2016 09:36   US Abdomen Limited Ruq  Result Date: 11/15/2016 CLINICAL DATA:  Abdominal pain EXAM: US ABDOMEN LIMITED - RIGHT UPPER QUADRANT COMPARISON:  CT 1 month 1,016 FINDINGS: Gallbladder: Prior cholecystectomy Common bile duct: Diameter: Dilated, 12 mm. Dilated duct as seen on intraoperative cholangiogram. History new since prior CT. Liver: Intrahepatic biliary ductal dilatation. Increased echotexture throughout the liver. IMPRESSION: Prior cholecystectomy. Dilated intrahepatic and extrahepatic biliary ducts. Common bile duct dilated to 12 mm. Cannot exclude distal obstructing duct stone. This could be further evaluated with ERCP or MRCP. Fatty liver. Electronically Signed   By: Rolm Baptise M.D.   On: 11/15/2016 12:23     LOS: 5 days   Oren Binet, MD  [facility] Pager:[phone]  If 7PM-7AM, please contact night-coverage [email or web address] Password [email or web address] 11/20/2016, 1:44 PM

## 2016-11-21 ENCOUNTER — Encounter (HOSPITAL_COMMUNITY): Payer: Self-pay | Admitting: General Surgery

## 2016-11-21 LAB — CBC
HCT: 30.9 % — ABNORMAL LOW (ref 39.0–52.0)
Hemoglobin: 9.6 g/dL — ABNORMAL LOW (ref 13.0–17.0)
MCH: 26.2 pg (ref 26.0–34.0)
MCHC: 31.1 g/dL (ref 30.0–36.0)
MCV: 84.4 fL (ref 78.0–100.0)
PLATELETS: 273 10*3/uL (ref 150–400)
RBC: 3.66 MIL/uL — ABNORMAL LOW (ref 4.22–5.81)
RDW: 14.6 % (ref 11.5–15.5)
WBC: 9.7 10*3/uL (ref 4.0–10.5)

## 2016-11-21 LAB — GLUCOSE, CAPILLARY
GLUCOSE-CAPILLARY: 135 mg/dL — AB (ref 65–99)
GLUCOSE-CAPILLARY: 148 mg/dL — AB (ref 65–99)
GLUCOSE-CAPILLARY: 149 mg/dL — AB (ref 65–99)
GLUCOSE-CAPILLARY: 186 mg/dL — AB (ref 65–99)
Glucose-Capillary: 151 mg/dL — ABNORMAL HIGH (ref 65–99)
Glucose-Capillary: 195 mg/dL — ABNORMAL HIGH (ref 65–99)

## 2016-11-21 LAB — COMPREHENSIVE METABOLIC PANEL
ALT: 38 U/L (ref 17–63)
AST: 30 U/L (ref 15–41)
Albumin: 2 g/dL — ABNORMAL LOW (ref 3.5–5.0)
Alkaline Phosphatase: 209 U/L — ABNORMAL HIGH (ref 38–126)
Anion gap: 8 (ref 5–15)
BUN: 8 mg/dL (ref 6–20)
CHLORIDE: 102 mmol/L (ref 101–111)
CO2: 29 mmol/L (ref 22–32)
Calcium: 8.6 mg/dL — ABNORMAL LOW (ref 8.9–10.3)
Creatinine, Ser: 0.91 mg/dL (ref 0.61–1.24)
Glucose, Bld: 154 mg/dL — ABNORMAL HIGH (ref 65–99)
POTASSIUM: 4 mmol/L (ref 3.5–5.1)
SODIUM: 139 mmol/L (ref 135–145)
Total Bilirubin: 0.8 mg/dL (ref 0.3–1.2)
Total Protein: 5.6 g/dL — ABNORMAL LOW (ref 6.5–8.1)

## 2016-11-21 LAB — PROTIME-INR
INR: 1.22
PROTHROMBIN TIME: 15.5 s — AB (ref 11.4–15.2)

## 2016-11-21 MED ORDER — ENOXAPARIN SODIUM 40 MG/0.4ML ~~LOC~~ SOLN
40.0000 mg | Freq: Every day | SUBCUTANEOUS | Status: DC
  Administered 2016-11-22 – 2016-11-23 (×2): 40 mg via SUBCUTANEOUS
  Filled 2016-11-21 (×2): qty 0.4

## 2016-11-21 NOTE — Progress Notes (Signed)
PROGRESS NOTE        PATIENT DETAILS Name: Henry Lindsey Age: 83 y.o. Sex: male Date of Birth: September 08, 1933 Admit Date: 11/15/2016 Admitting Physician Vianne Bulls, MD [id number], Aris Everts, MD  Brief Narrative: Patient is a 83 y.o. male with history of hypertension, insulin-dependent diabetes, CAD who recently had acute cholecystitis and had a percutaneous drain placed initially, and subsequently underwent cholecystectomy at [facility] on 1/24, he presented on 1/26 with worsening abdominal pain. He was initially thought to have possible CBD obstruction, but upon further evaluation was found to have a biliary leak. He subsequently underwent ERCP with stent placement on 1/28. Hospital course has been complicated by persistent abdominal pain. See below for further details  Subjective: Pain appears adequately controlled  Assessment/Plan: Abdominal pain due to biliary leak with a Biloma s/p recent cholecystectomy on 1/24: Underwent ERCP and stent placement on 1/28. Continues to have abdominal pain-lipase levels within normal limits. Repeat CT scan of the abdomen on 1/31 showed persistent Bilioma-Gen. surgery recommending CT-guided drainage. I am planning on placing drain on 2/1.Continue empiric Zosyn-potential stop date on 2/4.  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 Resume cilostazol once all procedures are complete.  Insulin-dependent type 2 diabetes: CBGs currently stable-continue 18 units of Lantus and SSI. Will resume home regimen of metformin, Januvia, glipizide and Humalog 75/25 6 units in a.m. and 8 units in p.m. on discharge  Hypertension: Controlled with metoprolol  Dyslipidemia: Continue statin  Malnutrition of moderate degree: Continue  supplements  DVT Prophylaxis: Prophylactic Lovenox  Code Status: Full code  Family Communication: Daughter at bedside   Disposition Plan: Remain inpatient-home in 2-3 days  Antimicrobial agents: Anti-infectives    Start     Dose/Rate Route Frequency Ordered Stop   11/16/16 0600  piperacillin-tazobactam (ZOSYN) IVPB 3.375 g     3.375 g 12.5 mL/hr over 240 Minutes Intravenous Every 8 hours 11/15/16 2149     11/15/16 2200  piperacillin-tazobactam (ZOSYN) IVPB 3.375 g     3.375 g 100 mL/hr over 30 Minutes Intravenous  Once 11/15/16 2149 11/15/16 2329      Procedures: ERCP 1/28  CONSULTS:  GI and general surgery  Time spent: 25 minutes-Greater than 50% of this time was spent in counseling, explanation of diagnosis, planning of further management, and coordination of care.  MEDICATIONS: Scheduled Meds: . atorvastatin  40 mg Oral q1800  . cholecalciferol  2,000 Units Oral Daily  . enoxaparin (LOVENOX) injection  40 mg Subcutaneous QHS  . feeding supplement (ENSURE ENLIVE)  237 mL Oral BID  . gabapentin  400 mg Oral TID  . insulin aspart  0-9 Units Subcutaneous Q4H  . insulin glargine  8 Units Subcutaneous QHS  . latanoprost  1 drop Both Eyes QHS  . metoprolol succinate  100 mg Oral Daily  . multivitamin with minerals  1 tablet Oral Daily  . pantoprazole  40 mg Oral BID  . piperacillin-tazobactam (ZOSYN)  IV  3.375 g Intravenous Q8H  . saccharomyces boulardii  250 mg Oral BID  . senna  1 tablet Oral BID  . tamsulosin  0.4 mg Oral QPC breakfast   Continuous Infusions:  PRN Meds:.acetaminophen **  OR** acetaminophen, bisacodyl, fentaNYL (SUBLIMAZE) injection, HYDROmorphone (DILAUDID) injection, magic mouthwash, ondansetron (ZOFRAN) IV, oxyCODONE-acetaminophen, polyethylene glycol   PHYSICAL EXAM: Vital signs: Vitals:   11/20/16 1243 11/20/16 2151 11/21/16 0547 11/21/16 1048  BP: 134/66 (!) 160/71 (!) 153/73   Pulse: 65 84 77   Resp: 18 17 18    Temp: 98.2 F (36.8  C) 98.3 F (36.8 C) 98.7 F (37.1 C)   TempSrc: Oral Oral Oral   SpO2: 96% 97% 98%   Weight:    80.4 kg (177 lb 4 oz)  Height:    5\' 11"  (1.803 m)   Filed Weights   11/19/16 0419 11/20/16 0553 11/21/16 1048  Weight: 77.8 kg (171 lb 8.3 oz) 76.7 kg (169 lb) 80.4 kg (177 lb 4 oz)   Body mass index is 24.72 kg/m.   General appearance :Awake, alert, not in any distress. Speech Clear.  Eyes:, pupils equally reactive to light and accomodation,no scleral icterus.Pink conjunctiva HEENT: Atraumatic and Normocephalic Neck: supple, no JVD. No cervical lymphadenopathy. No thyromegaly Resp:Good air entry bilaterally, no added sounds  CVS: S1 S2 regular, no murmurs.  GI: Bowel sounds present,Slightly tender in the epigastric area, abdomen appears slightly distended. Extremities: B/L Lower Ext shows no edema, both legs are warm to touch Neurology:  speech clear,Non focal, sensation is grossly intact. Psychiatric: Normal judgment and insight. Alert and oriented x 3. Normal mood. Musculoskeletal:No digital cyanosis Skin:No Rash, warm and dry Wounds:N/A  I have personally reviewed following labs and imaging studies  LABORATORY DATA: CBC:  Recent Labs Lab 11/17/16 0523 11/18/16 0523 11/19/16 0542 11/20/16 0211 11/21/16 0416  WBC 9.1 7.2 6.9 9.4 9.7  HGB 10.3* 9.8* 9.1* 9.2* 9.6*  HCT 33.6* 31.8* 29.1* 29.6* 30.9*  MCV 85.7 84.6 85.1 85.1 84.4  PLT 244 229 230 249 [id number]    Basic Metabolic Panel:  Recent Labs Lab 11/17/16 0523 11/18/16 0523 11/19/16 0542 11/20/16 0211 11/21/16 0416  NA 135 139 139 137 139  K 3.4* 3.5 3.3* 3.8 4.0  CL 102 106 104 102 102  CO2 25 27 26 26 29   GLUCOSE 122* 130* 140* 175* 154*  BUN 11 12 9 9 8   CREATININE 0.97 0.95 0.86 0.84 0.91  CALCIUM 8.4* 8.1* 7.9* 8.1* 8.6*  MG  --   --   --  1.8  --     GFR: Estimated Creatinine Clearance: 67.8 mL/min (by C-G formula based on SCr of 0.91 mg/dL).  Liver Function Tests:  Recent Labs Lab  11/17/16 0523 11/18/16 0523 11/19/16 0542 11/20/16 0211 11/21/16 0416  AST 24 20 21 29 30   ALT 75* 50 40 40 38  ALKPHOS 160* 145* 154* 183* 209*  BILITOT 1.7* 1.7* 0.8 1.0 0.8  PROT 5.4* 4.9* 5.0* 5.1* 5.6*  ALBUMIN 2.1* 1.9* 1.8* 1.9* 2.0*    Recent Labs Lab 11/15/16 0958 11/20/16 0834  LIPASE 55* 33   No results for input(s): AMMONIA in the last 168 hours.  Coagulation Profile:  Recent Labs Lab 11/16/16 0725 11/21/16 0824  INR 1.37 1.22    Cardiac Enzymes:  Recent Labs Lab 11/15/16 0958  TROPONINI <0.03    BNP (last 3 results) No results for input(s): PROBNP in the last 8760 hours.  HbA1C: No results for input(s): HGBA1C in the last 72 hours.  CBG:  Recent Labs Lab 11/20/16 2010 11/21/16 0026 11/21/16 0409 11/21/16 0738 11/21/16 1119  GLUCAP 155* 186* 148* 151* 149*    Lipid Profile: No results for input(s): CHOL, HDL, LDLCALC,  TRIG, CHOLHDL, LDLDIRECT in the last 72 hours.  Thyroid Function Tests: No results for input(s): TSH, T4TOTAL, FREET4, T3FREE, THYROIDAB in the last 72 hours.  Anemia Panel: No results for input(s): VITAMINB12, FOLATE, FERRITIN, TIBC, IRON, RETICCTPCT in the last 72 hours.  Urine analysis:    Component Value Date/Time   COLORURINE YELLOW 11/15/2016 0958   APPEARANCEUR CLEAR 11/15/2016 0958   APPEARANCEUR Clear 08/17/2012 1505   LABSPEC 1.020 11/15/2016 0958   LABSPEC 1.017 08/17/2012 1505   PHURINE 7.0 11/15/2016 0958   GLUCOSEU 500 (A) 11/15/2016 0958   GLUCOSEU 50 mg/dL 08/17/2012 1505   HGBUR MODERATE (A) 11/15/2016 0958   BILIRUBINUR SMALL (A) 11/15/2016 0958   BILIRUBINUR neg 10/07/2014 1444   BILIRUBINUR Negative 08/17/2012 1505   KETONESUR 15 (A) 11/15/2016 0958   PROTEINUR 100 (A) 11/15/2016 0958   UROBILINOGEN 0.2 10/07/2014 1444   NITRITE NEGATIVE 11/15/2016 0958   LEUKOCYTESUR NEGATIVE 11/15/2016 0958   LEUKOCYTESUR Negative 08/17/2012 1505    Sepsis Labs: Lactic Acid, Venous    Component  Value Date/Time   LATICACIDVEN 1.5 10/21/2016 0036    MICROBIOLOGY: No results found for this or any previous visit (from the past 240 hour(s)).  RADIOLOGY STUDIES/RESULTS: Dg Cholangiogram Operative  Result Date: 11/13/2016 CLINICAL DATA:  Cholecystectomy. History of percutaneous cholecystostomy tube. EXAM: INTRAOPERATIVE CHOLANGIOGRAM TECHNIQUE: Cholangiographic images from the C-arm fluoroscopic device were submitted for interpretation post-operatively. Please see the procedural report for the amount of contrast and the fluoroscopy time utilized. COMPARISON:  Cholecystostomy tube injection 10/29/2016 FINDINGS: Opacification of the intrahepatic and extrahepatic biliary system. There is moderate dilatation of the biliary system. Question minimal drainage into the duodenum. There appears to be extravasation of contrast near the injection site. Cannot exclude sludge within the common bile duct. IMPRESSION: Dilatation of the biliary system without significant drainage into the duodenum. Question minimal filling of the duodenum. Findings are concerning for narrowing or obstruction in the distal common bile duct. Electronically Signed   By: Markus Daft M.D.   On: 11/13/2016 14:34   Nm Hepatobiliary Liver Func  Result Date: 11/17/2016 CLINICAL DATA:  83 year old male status post cholecystectomy. Evaluate for bile leak. EXAM: NUCLEAR MEDICINE HEPATOBILIARY IMAGING TECHNIQUE: Sequential images of the abdomen were obtained out to 60 minutes following intravenous administration of radiopharmaceutical. RADIOPHARMACEUTICALS:  5.15 mCi Tc-52m  Choletec IV COMPARISON:  Abdominal CT dated 11/16/2016 FINDINGS: Prompt uptake and biliary excretion of activity by the liver is seen. There is accumulation of radiotracer outside of the biliary tree corresponding to the fluid collection seen on the CT within the gallbladder fossa as well as sacculation of the radiopharmaceutical within the fluid collection along the inferior  surface of the left lobe of the liver. Findings most consistent with contained bite leak or biloma. No extension of radiotracer noted inferiorly within the abdomen. IMPRESSION: Extra-biliary collection of radiopharmaceutical in the gallbladder fossa and along the inferior surface of the left lobe of the liver corresponding to the fluid collections seen on the CT and most consistent with contained bite leak/biloma. Electronically Signed   By: Anner Crete M.D.   On: 11/17/2016 00:41   Ct Abdomen Pelvis W Contrast  Result Date: 11/20/2016 CLINICAL DATA:  Cholecystectomy 1 week prior, biloma. Persistent pain. EXAM: CT ABDOMEN AND PELVIS WITH CONTRAST TECHNIQUE: Multidetector CT imaging of the abdomen and pelvis was performed using the standard protocol following bolus administration of intravenous contrast. CONTRAST:  121mL ISOVUE-300 IOPAMIDOL (ISOVUE-300) INJECTION 61% COMPARISON:  CT 11/16/2016 FINDINGS: Lower chest: Decreased  bilateral pleural effusions and associated bibasilar atelectasis. Coronary artery calcifications again seen. Hepatobiliary: Biliary stent in the common bile duct with improved in near completely resolved intra and extrahepatic biliary ductal dilatation. Fluid collection in the gallbladder fossa measures 7 x 4.4 cm and contains heterogeneous density and internal foci of air. Contiguous fluid tracks adjacent to the lateral left lobe of the liver, collection currently measuring 6.6 x 4.5 cm. Tracking free fluid adjacent to the stomach in caudate lobe of the liver, similar to prior, with decreased fluid tracking in the left upper quadrant. Pancreas: No ductal dilatation or inflammation. Spleen: Calcified granuloma.  Decreased perisplenic fluid. Adrenals/Urinary Tract: No adrenal nodule. Bilateral renal cysts are unchanged. No hydronephrosis. Symmetric enhancement and excretion on delayed phase imaging. Urinary bladder is physiologically distended without wall thickening. Stomach/Bowel:  Colonic diverticulosis from the splenic flexure to the sigmoid. No diverticulitis. No small bowel dilatation or obstruction. Enteric contrast reaches the colon. Normal appendix. Stomach physiologically distended. Vascular/Lymphatic: Calcified and noncalcified atheromatous plaque throughout the abdominal aorta without aneurysm. retroaortic left renal vein. No bulky adenopathy, small retroperitoneal lymph nodes. Reproductive: Prostate is unremarkable. Other: Small soft tissue nodule in the right anterior abdominal wall may be postsurgical. Trace fluid in the right pericolic gutter, improved from prior exam. Minimal air about the falciform ligament may be secondary to recent ERCP. Free intra-abdominal air has otherwise resolved. Fat within both inguinal canals. Musculoskeletal: Again seen degenerative change throughout spine. Unchanged osseous structures. IMPRESSION: 1. Placement of biliary stent with complete resolution of biliary dilatation. 2. Biloma in the gallbladder fossa and adjacent to the left lobe of the liver, unchanged in size from prior CT. There is decreased free fluid tracking in the left upper quadrant and right pericolic gutter. 3. Resolving small bowel ileus. No evidence of obstruction. Colonic diverticulosis without acute inflammation. 4. Decreasing bilateral pleural effusions and bibasilar atelectasis. Electronically Signed   By: Jeb Levering M.D.   On: 11/20/2016 21:26   Ct Abdomen Pelvis W Contrast  Result Date: 11/16/2016 CLINICAL DATA:  Patient with recent cholecystectomy 3 days prior. Dilated intrahepatic and extrahepatic bile ducts. Evaluate for biloma. EXAM: CT ABDOMEN AND PELVIS WITH CONTRAST TECHNIQUE: Multidetector CT imaging of the abdomen and pelvis was performed using the standard protocol following bolus administration of intravenous contrast. CONTRAST:  129mL ISOVUE-300 IOPAMIDOL (ISOVUE-300) INJECTION 61% COMPARISON:  CT abdomen pelvis 10/21/2016. FINDINGS: Lower chest:  Normal heart size. Coronary arterial vascular calcifications. Small layering bilateral pleural effusions. Subpleural ground-glass and consolidative opacities within the lower lobes bilaterally. Hepatobiliary: Liver is normal in size and contour. Postsurgical changes compatible with interval cholecystectomy. There is flocculent fluid demonstrated within the gallbladder fossa. There are a few small foci of gas within the gallbladder fossa. Additionally, there is fluid extending from the gallbladder fossa into the gastrohepatic ligament and about the liver. Additionally fluid extends within the left upper quadrant around the spleen. Mild intrahepatic and extrahepatic biliary ductal dilatation. Pancreas: Mild pancreatic atrophy. Spleen: Unremarkable. Adrenals/Urinary Tract: The adrenal glands are normal. 1.5 cm cyst superior pole right kidney, 1.5 cm cyst inferior pole right kidney and 1.9 cm cyst interpolar region left kidney. There is a 1.5 cm cyst within the superior pole left kidney. Urinary bladder is unremarkable. Stomach/Bowel: Sigmoid colonic diverticulosis. Small amount of free fluid in the pelvis. Free intraperitoneal air within the upper abdomen. Normal appendix. There a few mildly distended loops of small bowel within the central abdomen. Normal morphology of the stomach. Vascular/Lymphatic: Peripheral calcified atherosclerotic plaque. Retroaortic left  renal vein. No retroperitoneal lymphadenopathy. Reproductive: Prostate unremarkable. Other: Small bilateral fat containing inguinal hernias, left-greater-than-right. Musculoskeletal: Multilevel degenerative disc disease. Grade 1 anterolisthesis L4 on L5. IMPRESSION: Postsurgical changes compatible with interval cholecystectomy. Flocculent fluid within the gallbladder fossa likely secondary to postsurgical change/ surgical material. Recommend correlation with operative history. Additionally, there is fluid extending from the cholecystectomy site into the  central abdomen and left upper quadrant which is nonspecific however may be secondary to a bile leak. Recommend correlation with HIDA scan. Small amount of free intraperitoneal air within the upper abdomen likely reflective of recent postoperative state. Mild intrahepatic and extrahepatic biliary ductal dilatation. Aortic atherosclerosis. Mild distention of the small bowel centrally likely secondary to ileus. Small layering bilateral pleural effusions with underlying ground-glass consolidative opacities favored to represent atelectasis. Infection not excluded. These results will be called to the ordering clinician or representative by the Radiologist Assistant, and communication documented in the PACS or zVision Dashboard. Electronically Signed   By: Lovey Newcomer M.D.   On: 11/16/2016 20:04   Dg Ercp Biliary & Pancreatic Ducts  Result Date: 11/17/2016 CLINICAL DATA:  Bile leak with stent placement and sphincterotomy. EXAM: ERCP TECHNIQUE: Multiple spot images obtained with the fluoroscopic device and submitted for interpretation post-procedure. FLUOROSCOPY TIME:  Fluoroscopy Time:  19 minutes and 42 seconds Number of Acquired Spot Images: 8 COMPARISON:  Abdominal CT 11/16/2016. Hepatobiliary examination 11/16/2016 FINDINGS: First set of images demonstrate cannulation and opacification of the main pancreatic duct. The biliary system was cannulated and opacified with contrast. Final image demonstrates a catheter or stent in the common bile duct. Limited evaluation for contrast extravasation. IMPRESSION: Cannulation and opacification of the biliary system for sphincterotomy and stent placement. These images were submitted for radiologic interpretation only. Please see the procedural report for the amount of contrast and the fluoroscopy time utilized. Electronically Signed   By: Markus Daft M.D.   On: 11/17/2016 14:59   Dg Abd Portable 1v  Result Date: 11/20/2016 CLINICAL DATA:  Abdominal pain and distention.  EXAM: PORTABLE ABDOMEN - 1 VIEW COMPARISON:  CT 11/16/2016 FINDINGS: Biliary stent noted. Abdominal gas pattern is negative for bowel obstruction or perforation. IMPRESSION: Normal abdominal gas pattern.  Biliary stent is visible. Electronically Signed   By: Andreas Newport M.D.   On: 11/20/2016 02:18   Dg Cholangiogram  Existing Tube  Result Date: 10/29/2016 INDICATION: Cholecystostomy tube placed on October 05, 2016 ; here for evaluation prior to cholecystectomy. EXAM: CATHETER CHOLANGIOGRAM MEDICATIONS: No antibiotics were employed ANESTHESIA/SEDATION: No conscious sedation was employed. FLUOROSCOPY TIME:  Fluoroscopy Time: 1 minutes 6 seconds (178 mGy). COMPLICATIONS: None immediate. PROCEDURE: The cholecystostomy tube was accessed with a luer lock syringe. Under fluoroscopic guidance a total of 10 cc of a solution of Isovue 300 and sterile saline 1:3 was instilled. The gallbladder was contracted with the catheter noted be intraluminal. No leakage of contrast outside the gallbladder was observed. There is was prompt filling of the cystic duct and common bile duct. The intrahepatic ducts partially filled. The contour of the common bile duct was normal with no filling defects observed. A tiny amount of drainage into the duodenum was observed at the conclusion of the study. 10 cc of contrast and bile were aspirated into the syringe at the conclusion of the study. The patient voiced no discomfort IMPRESSION: Injection of the cholecystostomy tube with filling of a portion of the gallbladder. The visualized portions of the gallbladder exhibit no inflammatory changes. Normal appearance of the cystic duct  and common bile duct with no intraductal stones observed. Electronically Signed   By: David  Martinique M.D.   On: 10/29/2016 09:36   US Abdomen Limited Ruq  Result Date: 11/15/2016 CLINICAL DATA:  Abdominal pain EXAM: US ABDOMEN LIMITED - RIGHT UPPER QUADRANT COMPARISON:  CT 1 month 1,016 FINDINGS: Gallbladder:  Prior cholecystectomy Common bile duct: Diameter: Dilated, 12 mm. Dilated duct as seen on intraoperative cholangiogram. History new since prior CT. Liver: Intrahepatic biliary ductal dilatation. Increased echotexture throughout the liver. IMPRESSION: Prior cholecystectomy. Dilated intrahepatic and extrahepatic biliary ducts. Common bile duct dilated to 12 mm. Cannot exclude distal obstructing duct stone. This could be further evaluated with ERCP or MRCP. Fatty liver. Electronically Signed   By: Rolm Baptise M.D.   On: 11/15/2016 12:23     LOS: 6 days   Oren Binet, MD  [facility] Pager:[phone]  If 7PM-7AM, please contact night-coverage [email or web address] Password [email or web address] 11/21/2016, 1:38 PM

## 2016-11-21 NOTE — Consult Note (Signed)
Chief Complaint: biloma  Referring Physician: Dr. Greer Pickerel  Supervising Physician: Marybelle Killings  Patient Status: [facility] - In-pt  HPI: Henry Lindsey is an 83 y.o. male who underwent a placement of a percutaneous cholecystostomy drain on Oct 06, 2016 secondary to acute cholecystitis.  He was then followed by a surgeon in [place].  A cholecystectomy was on done on 1/24-18.  The patient tolerated the procedure well.  He was discharged the day after.  He returned to [facility] however, on 11-15-16 secondary to abdominal pain.  He was noted to have a biloma and a bile leak.  He underwent an ERCP with stent placement on 11-17-16.  He has continued to have pain and a repeat CT scan was performed.  This still revealed a biloma.  We have been asked to drain this area to help with pain management for the patient.    Past Medical History:  Past Medical History:  Diagnosis Date  . BPH (benign prostatic hyperplasia)   . Cancer ([place])   . Carotid artery stenosis 11/2010   <50% bilaterally  . CVA (cerebral infarction) 11/2010   r thalamic lacunar  . Diabetes mellitus   . GERD (gastroesophageal reflux disease)   . Hyperlipidemia   . Hypertension   . Neuromuscular disorder ([place])   . Peripheral vascular disease in diabetes mellitus ([place]) 06/2012    95% occlusion s/p PTCA R SFA Dew Sept 2013    Past Surgical History:  Past Surgical History:  Procedure Laterality Date  . CHOLECYSTECTOMY N/A 11/13/2016   Procedure: LAPAROSCOPIC CHOLECYSTECTOMY WITH INTRAOPERATIVE CHOLANGIOGRAM;  Surgeon: Olean Ree, MD;  Location: [facility] ORS;  Service: General;  Laterality: N/A;  . ERCP N/A 11/17/2016   Procedure: ENDOSCOPIC RETROGRADE CHOLANGIOPANCREATOGRAPHY (ERCP);  Surgeon: Irene Shipper, MD;  Location: [facility] ENDOSCOPY;  Service: Endoscopy;  Laterality: N/A;  . IR GENERIC HISTORICAL  10/06/2016   IR PERC CHOLECYSTOSTOMY 10/06/2016 Aletta Edouard, MD [facility]-INTERV RAD  . JOINT REPLACEMENT Left 2014   left knee  . UPPER  GASTROINTESTINAL ENDOSCOPY    . WRIST SURGERY      Family History:  Family History  Problem Relation Age of Onset  . Diabetes Mother     Social History:  reports that he quit smoking about 48 years ago. His smoking use included Cigarettes. He started smoking about 81 years ago. He has a 16.50 pack-year smoking history. He has never used smokeless tobacco. He reports that he does not drink alcohol or use drugs.  Allergies: No Known Allergies  Medications: Medications have been reviewed  Please HPI for pertinent positives, otherwise complete 10 system ROS negative.  Mallampati Score: MD Evaluation Airway: WNL Heart: WNL Abdomen:  (disteded tender) Chest/ Lungs: WNL ASA  Classification: 3 Mallampati/Airway Score: Two  Physical Exam: BP (!) 153/73 (BP Location: Left Arm)   Pulse 77   Temp 98.7 F (37.1 C) (Oral)   Resp 18   Wt 169 lb (76.7 kg)   SpO2 98%   BMI 23.57 kg/m  Body mass index is 23.57 kg/m. General: pleasant, WD, WN white male who is laying in bed in NAD HEENT: head is normocephalic, atraumatic.  Sclera are noninjected.  PERRL.  Ears and nose without any masses or lesions.  Mouth is pink and moist Heart: regular, rate, and rhythm.  Normal s1,s2. No obvious murmurs, gallops, or rubs noted.  Palpable radial and pedal pulses bilaterally Lungs: CTAB, no wheezes, rhonchi, or rales noted.  Respiratory effort nonlabored Abd: soft, NT right  now as he just received pain medications, ND, +BS, no masses, hernias, or organomegaly.  All of his incisions are c/d/i with dermabond present Psych: A&Ox3 with an appropriate affect.   Labs: Results for orders placed or performed during the hospital encounter of 11/15/16 (from the past 48 hour(s))  Glucose, capillary     Status: Abnormal   Collection Time: 11/19/16 11:13 AM  Result Value Ref Range   Glucose-Capillary 246 (H) 65 - 99 mg/dL   Comment 1 Document in Chart   Glucose, capillary     Status: Abnormal   Collection  Time: 11/19/16  4:40 PM  Result Value Ref Range   Glucose-Capillary 170 (H) 65 - 99 mg/dL   Comment 1 Document in Chart   Glucose, capillary     Status: Abnormal   Collection Time: 11/19/16  8:40 PM  Result Value Ref Range   Glucose-Capillary 126 (H) 65 - 99 mg/dL  Glucose, capillary     Status: Abnormal   Collection Time: 11/19/16 11:45 PM  Result Value Ref Range   Glucose-Capillary 222 (H) 65 - 99 mg/dL  CBC     Status: Abnormal   Collection Time: 11/20/16  2:11 AM  Result Value Ref Range   WBC 9.4 4.0 - 10.5 K/uL   RBC 3.48 (L) 4.22 - 5.81 MIL/uL   Hemoglobin 9.2 (L) 13.0 - 17.0 g/dL   HCT 29.6 (L) 39.0 - 52.0 %   MCV 85.1 78.0 - 100.0 fL   MCH 26.4 26.0 - 34.0 pg   MCHC 31.1 30.0 - 36.0 g/dL   RDW 14.7 11.5 - 15.5 %   Platelets 249 150 - 400 K/uL  Comprehensive metabolic panel     Status: Abnormal   Collection Time: 11/20/16  2:11 AM  Result Value Ref Range   Sodium 137 135 - 145 mmol/L   Potassium 3.8 3.5 - 5.1 mmol/L   Chloride 102 101 - 111 mmol/L   CO2 26 22 - 32 mmol/L   Glucose, Bld 175 (H) 65 - 99 mg/dL   BUN 9 6 - 20 mg/dL   Creatinine, Ser 0.84 0.61 - 1.24 mg/dL   Calcium 8.1 (L) 8.9 - 10.3 mg/dL   Total Protein 5.1 (L) 6.5 - 8.1 g/dL   Albumin 1.9 (L) 3.5 - 5.0 g/dL   AST 29 15 - 41 U/L   ALT 40 17 - 63 U/L   Alkaline Phosphatase 183 (H) 38 - 126 U/L   Total Bilirubin 1.0 0.3 - 1.2 mg/dL   GFR calc non Af Amer >60 >60 mL/min   GFR calc Af Amer >60 >60 mL/min    Comment: (NOTE) The eGFR has been calculated using the CKD EPI equation. This calculation has not been validated in all clinical situations. eGFR's persistently <60 mL/min signify possible Chronic Kidney Disease.    Anion gap 9 5 - 15  Magnesium     Status: None   Collection Time: 11/20/16  2:11 AM  Result Value Ref Range   Magnesium 1.8 1.7 - 2.4 mg/dL  Glucose, capillary     Status: Abnormal   Collection Time: 11/20/16  4:23 AM  Result Value Ref Range   Glucose-Capillary 143 (H) 65 - 99  mg/dL  Glucose, capillary     Status: Abnormal   Collection Time: 11/20/16  7:52 AM  Result Value Ref Range   Glucose-Capillary 124 (H) 65 - 99 mg/dL   Comment 1 Notify RN   Lipase, blood     Status: None  Collection Time: 11/20/16  8:34 AM  Result Value Ref Range   Lipase 33 11 - 51 U/L  Glucose, capillary     Status: Abnormal   Collection Time: 11/20/16 12:09 PM  Result Value Ref Range   Glucose-Capillary 177 (H) 65 - 99 mg/dL   Comment 1 Notify RN   Glucose, capillary     Status: Abnormal   Collection Time: 11/20/16  4:36 PM  Result Value Ref Range   Glucose-Capillary 229 (H) 65 - 99 mg/dL   Comment 1 Notify RN   Glucose, capillary     Status: Abnormal   Collection Time: 11/20/16  8:10 PM  Result Value Ref Range   Glucose-Capillary 155 (H) 65 - 99 mg/dL  Glucose, capillary     Status: Abnormal   Collection Time: 11/21/16 12:26 AM  Result Value Ref Range   Glucose-Capillary 186 (H) 65 - 99 mg/dL  Glucose, capillary     Status: Abnormal   Collection Time: 11/21/16  4:09 AM  Result Value Ref Range   Glucose-Capillary 148 (H) 65 - 99 mg/dL  CBC     Status: Abnormal   Collection Time: 11/21/16  4:16 AM  Result Value Ref Range   WBC 9.7 4.0 - 10.5 K/uL   RBC 3.66 (L) 4.22 - 5.81 MIL/uL   Hemoglobin 9.6 (L) 13.0 - 17.0 g/dL   HCT 30.9 (L) 39.0 - 52.0 %   MCV 84.4 78.0 - 100.0 fL   MCH 26.2 26.0 - 34.0 pg   MCHC 31.1 30.0 - 36.0 g/dL   RDW 14.6 11.5 - 15.5 %   Platelets 273 150 - 400 K/uL  Comprehensive metabolic panel     Status: Abnormal   Collection Time: 11/21/16  4:16 AM  Result Value Ref Range   Sodium 139 135 - 145 mmol/L   Potassium 4.0 3.5 - 5.1 mmol/L   Chloride 102 101 - 111 mmol/L   CO2 29 22 - 32 mmol/L   Glucose, Bld 154 (H) 65 - 99 mg/dL   BUN 8 6 - 20 mg/dL   Creatinine, Ser 0.91 0.61 - 1.24 mg/dL   Calcium 8.6 (L) 8.9 - 10.3 mg/dL   Total Protein 5.6 (L) 6.5 - 8.1 g/dL   Albumin 2.0 (L) 3.5 - 5.0 g/dL   AST 30 15 - 41 U/L   ALT 38 17 - 63 U/L    Alkaline Phosphatase 209 (H) 38 - 126 U/L   Total Bilirubin 0.8 0.3 - 1.2 mg/dL   GFR calc non Af Amer >60 >60 mL/min   GFR calc Af Amer >60 >60 mL/min    Comment: (NOTE) The eGFR has been calculated using the CKD EPI equation. This calculation has not been validated in all clinical situations. eGFR's persistently <60 mL/min signify possible Chronic Kidney Disease.    Anion gap 8 5 - 15  Glucose, capillary     Status: Abnormal   Collection Time: 11/21/16  7:38 AM  Result Value Ref Range   Glucose-Capillary 151 (H) 65 - 99 mg/dL  Protime-INR     Status: Abnormal   Collection Time: 11/21/16  8:24 AM  Result Value Ref Range   Prothrombin Time 15.5 (H) 11.4 - 15.2 seconds   INR 1.22     Imaging: Ct Abdomen Pelvis W Contrast  Result Date: 11/20/2016 CLINICAL DATA:  Cholecystectomy 1 week prior, biloma. Persistent pain. EXAM: CT ABDOMEN AND PELVIS WITH CONTRAST TECHNIQUE: Multidetector CT imaging of the abdomen and pelvis was performed using the  standard protocol following bolus administration of intravenous contrast. CONTRAST:  182m ISOVUE-300 IOPAMIDOL (ISOVUE-300) INJECTION 61% COMPARISON:  CT 11/16/2016 FINDINGS: Lower chest: Decreased bilateral pleural effusions and associated bibasilar atelectasis. Coronary artery calcifications again seen. Hepatobiliary: Biliary stent in the common bile duct with improved in near completely resolved intra and extrahepatic biliary ductal dilatation. Fluid collection in the gallbladder fossa measures 7 x 4.4 cm and contains heterogeneous density and internal foci of air. Contiguous fluid tracks adjacent to the lateral left lobe of the liver, collection currently measuring 6.6 x 4.5 cm. Tracking free fluid adjacent to the stomach in caudate lobe of the liver, similar to prior, with decreased fluid tracking in the left upper quadrant. Pancreas: No ductal dilatation or inflammation. Spleen: Calcified granuloma.  Decreased perisplenic fluid. Adrenals/Urinary  Tract: No adrenal nodule. Bilateral renal cysts are unchanged. No hydronephrosis. Symmetric enhancement and excretion on delayed phase imaging. Urinary bladder is physiologically distended without wall thickening. Stomach/Bowel: Colonic diverticulosis from the splenic flexure to the sigmoid. No diverticulitis. No small bowel dilatation or obstruction. Enteric contrast reaches the colon. Normal appendix. Stomach physiologically distended. Vascular/Lymphatic: Calcified and noncalcified atheromatous plaque throughout the abdominal aorta without aneurysm. retroaortic left renal vein. No bulky adenopathy, small retroperitoneal lymph nodes. Reproductive: Prostate is unremarkable. Other: Small soft tissue nodule in the right anterior abdominal wall may be postsurgical. Trace fluid in the right pericolic gutter, improved from prior exam. Minimal air about the falciform ligament may be secondary to recent ERCP. Free intra-abdominal air has otherwise resolved. Fat within both inguinal canals. Musculoskeletal: Again seen degenerative change throughout spine. Unchanged osseous structures. IMPRESSION: 1. Placement of biliary stent with complete resolution of biliary dilatation. 2. Biloma in the gallbladder fossa and adjacent to the left lobe of the liver, unchanged in size from prior CT. There is decreased free fluid tracking in the left upper quadrant and right pericolic gutter. 3. Resolving small bowel ileus. No evidence of obstruction. Colonic diverticulosis without acute inflammation. 4. Decreasing bilateral pleural effusions and bibasilar atelectasis. Electronically Signed   By: MJeb LeveringM.D.   On: 11/20/2016 21:26   Dg Abd Portable 1v  Result Date: 11/20/2016 CLINICAL DATA:  Abdominal pain and distention. EXAM: PORTABLE ABDOMEN - 1 VIEW COMPARISON:  CT 11/16/2016 FINDINGS: Biliary stent noted. Abdominal gas pattern is negative for bowel obstruction or perforation. IMPRESSION: Normal abdominal gas pattern.   Biliary stent is visible. Electronically Signed   By: DAndreas NewportM.D.   On: 11/20/2016 02:18    Assessment/Plan 1. Biloma, s/p cholecystectomy -we will plan to proceed today with placement of a percutaneous drain to help evacuate and manage the biloma.  He already has a stent in place to help with flow diversion.   -labs and vitals have been reviewed -the patient's daughter had a lot of questions regarding his situation.  All questions were answered for her and the patient.  Both were satisfied. -Risks and Benefits discussed with the patient including bleeding, infection, damage to adjacent structures, bowel perforation/fistula connection, and sepsis. All of the patient's questions were answered, patient is agreeable to proceed. Consent signed and in chart.  Thank you for this interesting consult.  I greatly enjoyed meeting WCESARIO WEIDINGERand look forward to participating in their care.  A copy of this report was sent to the requesting provider on this date.  Electronically Signed: OHenreitta Cea2/10/2016, 10:17 AM   I spent a total of    30 minutes in face to face in clinical consultation, greater  than 50% of which was counseling/coordinating care for biloma

## 2016-11-21 NOTE — Progress Notes (Signed)
4 Days Post-Op  Subjective: Patient denied any significant discomfort last evening. He is less distended on exam today. We discussed the possibility of an IR drain of the biloma. He is not happy with his Perc drain experience, and is somewhat hesitant about having a second drain. I told him he could discuss this with IR, this is an option.  Objective: Vital signs in last 24 hours: Temp:  [98.2 F (36.8 C)-98.7 F (37.1 C)] 98.7 F (37.1 C) (02/01 0547) Pulse Rate:  [65-84] 77 (02/01 0547) Resp:  [17-18] 18 (02/01 0547) BP: (134-160)/(66-73) 153/73 (02/01 0547) SpO2:  [96 %-98 %] 98 % (02/01 0547) Last BM Date: 11/20/16 600 PO 4850 urine BM x 1 Afebrile, VSS Lab OK Intake/Output from previous day: 01/31 0701 - 02/01 0700 In: 650 [P.O.:600; IV Piggyback:50] Out: [id number] [Urine:4850] Intake/Output this shift: No intake/output data recorded.  General appearance: alert, cooperative and no distress GI: soft, less tender and distended.  Lab Results:   Recent Labs  11/20/16 0211 11/21/16 0416  WBC 9.4 9.7  HGB 9.2* 9.6*  HCT 29.6* 30.9*  PLT 249 273    BMET  Recent Labs  11/20/16 0211 11/21/16 0416  NA 137 139  K 3.8 4.0  CL 102 102  CO2 26 29  GLUCOSE 175* 154*  BUN 9 8  CREATININE 0.84 0.91  CALCIUM 8.1* 8.6*   PT/INR No results for input(s): LABPROT, INR in the last 72 hours.   Recent Labs Lab 11/17/16 0523 11/18/16 0523 11/19/16 0542 11/20/16 0211 11/21/16 0416  AST 24 20 21 29 30   ALT 75* 50 40 40 38  ALKPHOS 160* 145* 154* 183* 209*  BILITOT 1.7* 1.7* 0.8 1.0 0.8  PROT 5.4* 4.9* 5.0* 5.1* 5.6*  ALBUMIN 2.1* 1.9* 1.8* 1.9* 2.0*     Lipase     Component Value Date/Time   LIPASE 33 11/20/2016 0834     Studies/Results: Ct Abdomen Pelvis W Contrast  Result Date: 11/20/2016 CLINICAL DATA:  Cholecystectomy 1 week prior, biloma. Persistent pain. EXAM: CT ABDOMEN AND PELVIS WITH CONTRAST TECHNIQUE: Multidetector CT imaging of the abdomen and  pelvis was performed using the standard protocol following bolus administration of intravenous contrast. CONTRAST:  174mL ISOVUE-300 IOPAMIDOL (ISOVUE-300) INJECTION 61% COMPARISON:  CT 11/16/2016 FINDINGS: Lower chest: Decreased bilateral pleural effusions and associated bibasilar atelectasis. Coronary artery calcifications again seen. Hepatobiliary: Biliary stent in the common bile duct with improved in near completely resolved intra and extrahepatic biliary ductal dilatation. Fluid collection in the gallbladder fossa measures 7 x 4.4 cm and contains heterogeneous density and internal foci of air. Contiguous fluid tracks adjacent to the lateral left lobe of the liver, collection currently measuring 6.6 x 4.5 cm. Tracking free fluid adjacent to the stomach in caudate lobe of the liver, similar to prior, with decreased fluid tracking in the left upper quadrant. Pancreas: No ductal dilatation or inflammation. Spleen: Calcified granuloma.  Decreased perisplenic fluid. Adrenals/Urinary Tract: No adrenal nodule. Bilateral renal cysts are unchanged. No hydronephrosis. Symmetric enhancement and excretion on delayed phase imaging. Urinary bladder is physiologically distended without wall thickening. Stomach/Bowel: Colonic diverticulosis from the splenic flexure to the sigmoid. No diverticulitis. No small bowel dilatation or obstruction. Enteric contrast reaches the colon. Normal appendix. Stomach physiologically distended. Vascular/Lymphatic: Calcified and noncalcified atheromatous plaque throughout the abdominal aorta without aneurysm. retroaortic left renal vein. No bulky adenopathy, small retroperitoneal lymph nodes. Reproductive: Prostate is unremarkable. Other: Small soft tissue nodule in the right anterior abdominal wall may be postsurgical.  Trace fluid in the right pericolic gutter, improved from prior exam. Minimal air about the falciform ligament may be secondary to recent ERCP. Free intra-abdominal air has  otherwise resolved. Fat within both inguinal canals. Musculoskeletal: Again seen degenerative change throughout spine. Unchanged osseous structures. IMPRESSION: 1. Placement of biliary stent with complete resolution of biliary dilatation. 2. Biloma in the gallbladder fossa and adjacent to the left lobe of the liver, unchanged in size from prior CT. There is decreased free fluid tracking in the left upper quadrant and right pericolic gutter. 3. Resolving small bowel ileus. No evidence of obstruction. Colonic diverticulosis without acute inflammation. 4. Decreasing bilateral pleural effusions and bibasilar atelectasis. Electronically Signed   By: Jeb Levering M.D.   On: 11/20/2016 21:26   Dg Abd Portable 1v  Result Date: 11/20/2016 CLINICAL DATA:  Abdominal pain and distention. EXAM: PORTABLE ABDOMEN - 1 VIEW COMPARISON:  CT 11/16/2016 FINDINGS: Biliary stent noted. Abdominal gas pattern is negative for bowel obstruction or perforation. IMPRESSION: Normal abdominal gas pattern.  Biliary stent is visible. Electronically Signed   By: Andreas Newport M.D.   On: 11/20/2016 02:18    Medications: . atorvastatin  40 mg Oral q1800  . cholecalciferol  2,000 Units Oral Daily  . enoxaparin (LOVENOX) injection  40 mg Subcutaneous QHS  . feeding supplement (ENSURE ENLIVE)  237 mL Oral BID  . gabapentin  400 mg Oral TID  . insulin aspart  0-9 Units Subcutaneous Q4H  . insulin glargine  8 Units Subcutaneous QHS  . latanoprost  1 drop Both Eyes QHS  . metoprolol succinate  100 mg Oral Daily  . multivitamin with minerals  1 tablet Oral Daily  . pantoprazole  40 mg Oral BID  . piperacillin-tazobactam (ZOSYN)  IV  3.375 g Intravenous Q8H  . saccharomyces boulardii  250 mg Oral BID  . senna  1 tablet Oral BID  . tamsulosin  0.4 mg Oral QPC breakfast    Assessment/Plan Bile leak after laparoscopic cholecystectomy 11/13/16 Dr.Piscoya, [facility]. ERCP with post op bile leak,  sphincterotomy, no CBD stones. High grade duodenal stenosis made for challenging but ultimately successful ERCP 11/17/16 Status post percutaneous cholecystostomy 10/06/16. History of CVA Type 2 diabetes Hypertension Neuromuscular disorder Vascular occlusive disease status post PTCA, right SFA. FEN: IV fluids/carb modified ID: [email or web address] 11/14/16 =>>day 7 DVT: Lovenox   Plan: We will ask IR to evaluate for possible drain placement for biloma.    LOS: 6 days    Ceola Para 11/21/2016 [phone]

## 2016-11-21 NOTE — Progress Notes (Signed)
PT Cancellation Note  Patient Details Name: Henry Lindsey MRN: [id number] DOB: Aug 06, 1933   Cancelled Treatment:    Reason Eval/Treat Not Completed: Medical issues which prohibited therapy (Pt going for drain placement and in pain. Will check back.)   Denice Paradise 11/21/2016, 10:09 AM [facility] [phone] (pager)

## 2016-11-22 ENCOUNTER — Inpatient Hospital Stay (HOSPITAL_COMMUNITY): Payer: Medicare Other

## 2016-11-22 LAB — GLUCOSE, CAPILLARY
GLUCOSE-CAPILLARY: 235 mg/dL — AB (ref 65–99)
GLUCOSE-CAPILLARY: 284 mg/dL — AB (ref 65–99)
Glucose-Capillary: 164 mg/dL — ABNORMAL HIGH (ref 65–99)
Glucose-Capillary: 187 mg/dL — ABNORMAL HIGH (ref 65–99)
Glucose-Capillary: 265 mg/dL — ABNORMAL HIGH (ref 65–99)
Glucose-Capillary: 279 mg/dL — ABNORMAL HIGH (ref 65–99)

## 2016-11-22 MED ORDER — MIDAZOLAM HCL 2 MG/2ML IJ SOLN
INTRAMUSCULAR | Status: AC | PRN
  Administered 2016-11-22 (×3): 1 mg via INTRAVENOUS

## 2016-11-22 MED ORDER — FENTANYL CITRATE (PF) 100 MCG/2ML IJ SOLN
INTRAMUSCULAR | Status: AC | PRN
  Administered 2016-11-22 (×2): 50 ug via INTRAVENOUS
  Administered 2016-11-22: 25 ug via INTRAVENOUS

## 2016-11-22 MED ORDER — FENTANYL CITRATE (PF) 100 MCG/2ML IJ SOLN
INTRAMUSCULAR | Status: AC
  Filled 2016-11-22: qty 4

## 2016-11-22 MED ORDER — MIDAZOLAM HCL 2 MG/2ML IJ SOLN
INTRAMUSCULAR | Status: AC
  Filled 2016-11-22: qty 4

## 2016-11-22 MED ORDER — LIDOCAINE-EPINEPHRINE (PF) 1 %-1:200000 IJ SOLN
INTRAMUSCULAR | Status: AC
  Filled 2016-11-22: qty 30

## 2016-11-22 NOTE — Progress Notes (Signed)
5 Days Post-Op  Subjective: 2 drains placed this Am.  Stomach is soft and not distended.  Right drain is bloody/bilious in color.  The left drain is serous.  Daughter wants him to stay here tonight.  Objective: Vital signs in last 24 hours: Temp:  [97.8 F (36.6 C)-98.7 F (37.1 C)] 97.8 F (36.6 C) (02/02 0532) Pulse Rate:  [72-86] 86 (02/02 0900) Resp:  [15-20] 17 (02/02 0900) BP: (116-155)/(56-71) 124/59 (02/02 0900) SpO2:  [92 %-96 %] 96 % (02/02 0900) Weight:  [80.4 kg (177 lb 4 oz)] 80.4 kg (177 lb 4 oz) (02/01 1048) Last BM Date: 11/20/16 660 PO 2950 IV Afebrile, VSS No labs Plan IR drain today Intake/Output from previous day: 02/01 0701 - 02/02 0700 In: 660 [P.O.:460; IV Piggyback:200] Out: 2950 [Urine:2950] Intake/Output this shift: No intake/output data recorded.  General appearance: alert, cooperative and no distress GI: soft, non-tender; bowel sounds normal; no masses,  no organomegaly and 2 IR drains left is clear serous fluid, and the right is red-brownish colored fluid.  Port sites all look fine.  Lab Results:   Recent Labs  11/20/16 0211 11/21/16 0416  WBC 9.4 9.7  HGB 9.2* 9.6*  HCT 29.6* 30.9*  PLT 249 273    BMET  Recent Labs  11/20/16 0211 11/21/16 0416  NA 137 139  K 3.8 4.0  CL 102 102  CO2 26 29  GLUCOSE 175* 154*  BUN 9 8  CREATININE 0.84 0.91  CALCIUM 8.1* 8.6*   PT/INR  Recent Labs  11/21/16 0824  LABPROT 15.5*  INR 1.22     Recent Labs Lab 11/17/16 0523 11/18/16 0523 11/19/16 0542 11/20/16 0211 11/21/16 0416  AST 24 20 21 29 30   ALT 75* 50 40 40 38  ALKPHOS 160* 145* 154* 183* 209*  BILITOT 1.7* 1.7* 0.8 1.0 0.8  PROT 5.4* 4.9* 5.0* 5.1* 5.6*  ALBUMIN 2.1* 1.9* 1.8* 1.9* 2.0*     Lipase     Component Value Date/Time   LIPASE 33 11/20/2016 0834     Studies/Results: Ct Abdomen Pelvis W Contrast  Result Date: 11/20/2016 CLINICAL DATA:  Cholecystectomy 1 week prior, biloma. Persistent pain. EXAM: CT  ABDOMEN AND PELVIS WITH CONTRAST TECHNIQUE: Multidetector CT imaging of the abdomen and pelvis was performed using the standard protocol following bolus administration of intravenous contrast. CONTRAST:  145mL ISOVUE-300 IOPAMIDOL (ISOVUE-300) INJECTION 61% COMPARISON:  CT 11/16/2016 FINDINGS: Lower chest: Decreased bilateral pleural effusions and associated bibasilar atelectasis. Coronary artery calcifications again seen. Hepatobiliary: Biliary stent in the common bile duct with improved in near completely resolved intra and extrahepatic biliary ductal dilatation. Fluid collection in the gallbladder fossa measures 7 x 4.4 cm and contains heterogeneous density and internal foci of air. Contiguous fluid tracks adjacent to the lateral left lobe of the liver, collection currently measuring 6.6 x 4.5 cm. Tracking free fluid adjacent to the stomach in caudate lobe of the liver, similar to prior, with decreased fluid tracking in the left upper quadrant. Pancreas: No ductal dilatation or inflammation. Spleen: Calcified granuloma.  Decreased perisplenic fluid. Adrenals/Urinary Tract: No adrenal nodule. Bilateral renal cysts are unchanged. No hydronephrosis. Symmetric enhancement and excretion on delayed phase imaging. Urinary bladder is physiologically distended without wall thickening. Stomach/Bowel: Colonic diverticulosis from the splenic flexure to the sigmoid. No diverticulitis. No small bowel dilatation or obstruction. Enteric contrast reaches the colon. Normal appendix. Stomach physiologically distended. Vascular/Lymphatic: Calcified and noncalcified atheromatous plaque throughout the abdominal aorta without aneurysm. retroaortic left renal vein. No  bulky adenopathy, small retroperitoneal lymph nodes. Reproductive: Prostate is unremarkable. Other: Small soft tissue nodule in the right anterior abdominal wall may be postsurgical. Trace fluid in the right pericolic gutter, improved from prior exam. Minimal air about the  falciform ligament may be secondary to recent ERCP. Free intra-abdominal air has otherwise resolved. Fat within both inguinal canals. Musculoskeletal: Again seen degenerative change throughout spine. Unchanged osseous structures. IMPRESSION: 1. Placement of biliary stent with complete resolution of biliary dilatation. 2. Biloma in the gallbladder fossa and adjacent to the left lobe of the liver, unchanged in size from prior CT. There is decreased free fluid tracking in the left upper quadrant and right pericolic gutter. 3. Resolving small bowel ileus. No evidence of obstruction. Colonic diverticulosis without acute inflammation. 4. Decreasing bilateral pleural effusions and bibasilar atelectasis. Electronically Signed   By: Jeb Levering M.D.   On: 11/20/2016 21:26    Medications: . atorvastatin  40 mg Oral q1800  . cholecalciferol  2,000 Units Oral Daily  . enoxaparin (LOVENOX) injection  40 mg Subcutaneous QHS  . feeding supplement (ENSURE ENLIVE)  237 mL Oral BID  . fentaNYL      . gabapentin  400 mg Oral TID  . insulin aspart  0-9 Units Subcutaneous Q4H  . insulin glargine  8 Units Subcutaneous QHS  . latanoprost  1 drop Both Eyes QHS  . lidocaine-EPINEPHrine      . metoprolol succinate  100 mg Oral Daily  . midazolam      . multivitamin with minerals  1 tablet Oral Daily  . pantoprazole  40 mg Oral BID  . piperacillin-tazobactam (ZOSYN)  IV  3.375 g Intravenous Q8H  . saccharomyces boulardii  250 mg Oral BID  . senna  1 tablet Oral BID  . tamsulosin  0.4 mg Oral QPC breakfast    Assessment/Plan Bile leak after laparoscopic cholecystectomy 11/13/16 Dr.Piscoya, [facility]. ERCP with post op bile leak, sphincterotomy, no CBD stones. High grade duodenal stenosis made for challenging but ultimately successful ERCP 11/17/16 Status post percutaneous cholecystostomy 10/06/16. History of CVA Type 2 diabetes Hypertension Neuromuscular disorder Vascular occlusive  disease status post PTCA, right SFA. FEN: IV fluids/carb modified ID: [email or web address] 11/14/16 =>>day 7 DVT: Lovenox    Plan:  Follow up with Dr. Hampton Abbot in [place].  Home when medically stable.   LOS: 7 days    Henry Lindsey 11/22/2016 [phone]

## 2016-11-22 NOTE — Sedation Documentation (Signed)
Patient is resting comfortably. 

## 2016-11-22 NOTE — Progress Notes (Signed)
PROGRESS NOTE        PATIENT DETAILS Name: Henry Lindsey Age: 83 y.o. Sex: male Date of Birth: Jun 19, 1933 Admit Date: 11/15/2016 Admitting Physician Vianne Bulls, MD [id number], Aris Everts, MD  Brief Narrative: Patient is a 83 y.o. male with history of hypertension, insulin-dependent diabetes, CAD who recently had acute cholecystitis and had a percutaneous drain placed initially, and subsequently underwent cholecystectomy at [facility] on 1/24, he presented on 1/26 with worsening abdominal pain. He was initially thought to have possible CBD obstruction, but upon further evaluation was found to have a biliary leak. He subsequently underwent ERCP with stent placement on 1/28. Hospital course has been complicated by persistent abdominal pain. See below for further details  Subjective: Pain appears adequately controlled-feels better after he had CT-guided drainage of bilioma today  Assessment/Plan: Abdominal pain due to biliary leak with a Biloma s/p recent cholecystectomy on 1/24 (At [facility]): Underwent ERCP and stent placement on 1/28. Since he continued to have abdominal pain-a repeat  CT scan of the abdomen on 1/31 showed persistent Bilioma-Gen. surgery recommending CT-guided drainage. IR placed drain x 2 on 2/2. Culture also sent.Continue empiric Zosyn-potential stop date on 2/4.  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 Resume cilostazol once all procedures are complete.  Insulin-dependent type 2 diabetes: CBGs currently stable-continue 18 units of Lantus and SSI. Will resume home regimen of metformin, Januvia, glipizide and Humalog 75/25 6 units in a.m. and 8 units in p.m. on discharge  Hypertension: Controlled with metoprolol  Dyslipidemia: Continue  statin  Malnutrition of moderate degree: Continue supplements  DVT Prophylaxis: Prophylactic Lovenox  Code Status: Full code  Family Communication: Daughter at bedside   Disposition Plan: Remain inpatient-home in 2-3 days  Antimicrobial agents: Anti-infectives    Start     Dose/Rate Route Frequency Ordered Stop   11/16/16 0600  piperacillin-tazobactam (ZOSYN) IVPB 3.375 g     3.375 g 12.5 mL/hr over 240 Minutes Intravenous Every 8 hours 11/15/16 2149     11/15/16 2200  piperacillin-tazobactam (ZOSYN) IVPB 3.375 g     3.375 g 100 mL/hr over 30 Minutes Intravenous  Once 11/15/16 2149 11/15/16 2329      Procedures: ERCP 1/28  CONSULTS:  GI and general surgery  Time spent: 25 minutes-Greater than 50% of this time was spent in counseling, explanation of diagnosis, planning of further management, and coordination of care.  MEDICATIONS: Scheduled Meds: . atorvastatin  40 mg Oral q1800  . cholecalciferol  2,000 Units Oral Daily  . enoxaparin (LOVENOX) injection  40 mg Subcutaneous QHS  . feeding supplement (ENSURE ENLIVE)  237 mL Oral BID  . fentaNYL      . gabapentin  400 mg Oral TID  . insulin aspart  0-9 Units Subcutaneous Q4H  . insulin glargine  8 Units Subcutaneous QHS  . latanoprost  1 drop Both Eyes QHS  . lidocaine-EPINEPHrine      . metoprolol succinate  100 mg Oral Daily  . midazolam      . multivitamin with minerals  1 tablet Oral Daily  . pantoprazole  40 mg Oral BID  . piperacillin-tazobactam (ZOSYN)  IV  3.375 g Intravenous Q8H  .  saccharomyces boulardii  250 mg Oral BID  . senna  1 tablet Oral BID  . tamsulosin  0.4 mg Oral QPC breakfast   Continuous Infusions:  PRN Meds:.acetaminophen **OR** acetaminophen, bisacodyl, fentaNYL (SUBLIMAZE) injection, HYDROmorphone (DILAUDID) injection, magic mouthwash, ondansetron (ZOFRAN) IV, oxyCODONE-acetaminophen, polyethylene glycol   PHYSICAL EXAM: Vital signs: Vitals:   11/22/16 0922 11/22/16 0931  11/22/16 0941 11/22/16 0952  BP: (!) 153/65  134/63 (!) 122/59  Pulse: 84  81 87  Resp: 18  18 18   Temp:    97.8 F (36.6 C)  TempSrc:    Oral  SpO2: 94% 100% 92% 95%  Weight:      Height:       Filed Weights   11/19/16 0419 11/20/16 0553 11/21/16 1048  Weight: 77.8 kg (171 lb 8.3 oz) 76.7 kg (169 lb) 80.4 kg (177 lb 4 oz)   Body mass index is 24.72 kg/m.   General appearance :Awake, alert, not in any distress. Speech Clear.  Eyes:, pupils equally reactive to light and accomodation,no scleral icterus.Pink conjunctiva HEENT: Atraumatic and Normocephalic Neck: supple, no JVD. No cervical lymphadenopathy. No thyromegaly Resp:Good air entry bilaterally, no added sounds  CVS: S1 S2 regular, no murmurs.  GI: Bowel sounds present,Significantly less tender-2 Drains in place. No distention seen.   Extremities: B/L Lower Ext shows no edema, both legs are warm to touch Neurology:  speech clear,Non focal, sensation is grossly intact. Psychiatric: Normal judgment and insight. Alert and oriented x 3. Normal mood. Musculoskeletal:No digital cyanosis Skin:No Rash, warm and dry Wounds:N/A  I have personally reviewed following labs and imaging studies  LABORATORY DATA: CBC:  Recent Labs Lab 11/17/16 0523 11/18/16 0523 11/19/16 0542 11/20/16 0211 11/21/16 0416  WBC 9.1 7.2 6.9 9.4 9.7  HGB 10.3* 9.8* 9.1* 9.2* 9.6*  HCT 33.6* 31.8* 29.1* 29.6* 30.9*  MCV 85.7 84.6 85.1 85.1 84.4  PLT 244 229 230 [id number]    Basic Metabolic Panel:  Recent Labs Lab 11/17/16 0523 11/18/16 0523 11/19/16 0542 11/20/16 0211 11/21/16 0416  NA 135 139 139 137 139  K 3.4* 3.5 3.3* 3.8 4.0  CL 102 106 104 102 102  CO2 25 27 26 26 29   GLUCOSE 122* 130* 140* 175* 154*  BUN 11 12 9 9 8   CREATININE 0.97 0.95 0.86 0.84 0.91  CALCIUM 8.4* 8.1* 7.9* 8.1* 8.6*  MG  --   --   --  1.8  --     GFR: Estimated Creatinine Clearance: 67.8 mL/min (by C-G formula based on SCr of 0.91 mg/dL).  Liver  Function Tests:  Recent Labs Lab 11/17/16 0523 11/18/16 0523 11/19/16 0542 11/20/16 0211 11/21/16 0416  AST 24 20 21 29 30   ALT 75* 50 40 40 38  ALKPHOS 160* 145* 154* 183* 209*  BILITOT 1.7* 1.7* 0.8 1.0 0.8  PROT 5.4* 4.9* 5.0* 5.1* 5.6*  ALBUMIN 2.1* 1.9* 1.8* 1.9* 2.0*    Recent Labs Lab 11/20/16 0834  LIPASE 33   No results for input(s): AMMONIA in the last 168 hours.  Coagulation Profile:  Recent Labs Lab 11/16/16 0725 11/21/16 0824  INR 1.37 1.22    Cardiac Enzymes: No results for input(s): CKTOTAL, CKMB, CKMBINDEX, TROPONINI in the last 168 hours.  BNP (last 3 results) No results for input(s): PROBNP in the last 8760 hours.  HbA1C: No results for input(s): HGBA1C in the last 72 hours.  CBG:  Recent Labs Lab 11/21/16 2003 11/22/16 0032 11/22/16 0039 11/22/16 0417 11/22/16 1118  GLUCAP  195* 279* 284* 187* 164*    Lipid Profile: No results for input(s): CHOL, HDL, LDLCALC, TRIG, CHOLHDL, LDLDIRECT in the last 72 hours.  Thyroid Function Tests: No results for input(s): TSH, T4TOTAL, FREET4, T3FREE, THYROIDAB in the last 72 hours.  Anemia Panel: No results for input(s): VITAMINB12, FOLATE, FERRITIN, TIBC, IRON, RETICCTPCT in the last 72 hours.  Urine analysis:    Component Value Date/Time   COLORURINE YELLOW 11/15/2016 0958   APPEARANCEUR CLEAR 11/15/2016 0958   APPEARANCEUR Clear 08/17/2012 1505   LABSPEC 1.020 11/15/2016 0958   LABSPEC 1.017 08/17/2012 1505   PHURINE 7.0 11/15/2016 0958   GLUCOSEU 500 (A) 11/15/2016 0958   GLUCOSEU 50 mg/dL 08/17/2012 1505   HGBUR MODERATE (A) 11/15/2016 0958   BILIRUBINUR SMALL (A) 11/15/2016 0958   BILIRUBINUR neg 10/07/2014 1444   BILIRUBINUR Negative 08/17/2012 1505   KETONESUR 15 (A) 11/15/2016 0958   PROTEINUR 100 (A) 11/15/2016 0958   UROBILINOGEN 0.2 10/07/2014 1444   NITRITE NEGATIVE 11/15/2016 0958   LEUKOCYTESUR NEGATIVE 11/15/2016 0958   LEUKOCYTESUR Negative 08/17/2012 1505     Sepsis Labs: Lactic Acid, Venous    Component Value Date/Time   LATICACIDVEN 1.5 10/21/2016 0036    MICROBIOLOGY: No results found for this or any previous visit (from the past 240 hour(s)).  RADIOLOGY STUDIES/RESULTS: Dg Cholangiogram Operative  Result Date: 11/13/2016 CLINICAL DATA:  Cholecystectomy. History of percutaneous cholecystostomy tube. EXAM: INTRAOPERATIVE CHOLANGIOGRAM TECHNIQUE: Cholangiographic images from the C-arm fluoroscopic device were submitted for interpretation post-operatively. Please see the procedural report for the amount of contrast and the fluoroscopy time utilized. COMPARISON:  Cholecystostomy tube injection 10/29/2016 FINDINGS: Opacification of the intrahepatic and extrahepatic biliary system. There is moderate dilatation of the biliary system. Question minimal drainage into the duodenum. There appears to be extravasation of contrast near the injection site. Cannot exclude sludge within the common bile duct. IMPRESSION: Dilatation of the biliary system without significant drainage into the duodenum. Question minimal filling of the duodenum. Findings are concerning for narrowing or obstruction in the distal common bile duct. Electronically Signed   By: Markus Daft M.D.   On: 11/13/2016 14:34   Nm Hepatobiliary Liver Func  Result Date: 11/17/2016 CLINICAL DATA:  83 year old male status post cholecystectomy. Evaluate for bile leak. EXAM: NUCLEAR MEDICINE HEPATOBILIARY IMAGING TECHNIQUE: Sequential images of the abdomen were obtained out to 60 minutes following intravenous administration of radiopharmaceutical. RADIOPHARMACEUTICALS:  5.15 mCi Tc-56m  Choletec IV COMPARISON:  Abdominal CT dated 11/16/2016 FINDINGS: Prompt uptake and biliary excretion of activity by the liver is seen. There is accumulation of radiotracer outside of the biliary tree corresponding to the fluid collection seen on the CT within the gallbladder fossa as well as sacculation of the  radiopharmaceutical within the fluid collection along the inferior surface of the left lobe of the liver. Findings most consistent with contained bite leak or biloma. No extension of radiotracer noted inferiorly within the abdomen. IMPRESSION: Extra-biliary collection of radiopharmaceutical in the gallbladder fossa and along the inferior surface of the left lobe of the liver corresponding to the fluid collections seen on the CT and most consistent with contained bite leak/biloma. Electronically Signed   By: Anner Crete M.D.   On: 11/17/2016 00:41   Ct Abdomen Pelvis W Contrast  Result Date: 11/20/2016 CLINICAL DATA:  Cholecystectomy 1 week prior, biloma. Persistent pain. EXAM: CT ABDOMEN AND PELVIS WITH CONTRAST TECHNIQUE: Multidetector CT imaging of the abdomen and pelvis was performed using the standard protocol following bolus administration of intravenous  contrast. CONTRAST:  170mL ISOVUE-300 IOPAMIDOL (ISOVUE-300) INJECTION 61% COMPARISON:  CT 11/16/2016 FINDINGS: Lower chest: Decreased bilateral pleural effusions and associated bibasilar atelectasis. Coronary artery calcifications again seen. Hepatobiliary: Biliary stent in the common bile duct with improved in near completely resolved intra and extrahepatic biliary ductal dilatation. Fluid collection in the gallbladder fossa measures 7 x 4.4 cm and contains heterogeneous density and internal foci of air. Contiguous fluid tracks adjacent to the lateral left lobe of the liver, collection currently measuring 6.6 x 4.5 cm. Tracking free fluid adjacent to the stomach in caudate lobe of the liver, similar to prior, with decreased fluid tracking in the left upper quadrant. Pancreas: No ductal dilatation or inflammation. Spleen: Calcified granuloma.  Decreased perisplenic fluid. Adrenals/Urinary Tract: No adrenal nodule. Bilateral renal cysts are unchanged. No hydronephrosis. Symmetric enhancement and excretion on delayed phase imaging. Urinary bladder is  physiologically distended without wall thickening. Stomach/Bowel: Colonic diverticulosis from the splenic flexure to the sigmoid. No diverticulitis. No small bowel dilatation or obstruction. Enteric contrast reaches the colon. Normal appendix. Stomach physiologically distended. Vascular/Lymphatic: Calcified and noncalcified atheromatous plaque throughout the abdominal aorta without aneurysm. retroaortic left renal vein. No bulky adenopathy, small retroperitoneal lymph nodes. Reproductive: Prostate is unremarkable. Other: Small soft tissue nodule in the right anterior abdominal wall may be postsurgical. Trace fluid in the right pericolic gutter, improved from prior exam. Minimal air about the falciform ligament may be secondary to recent ERCP. Free intra-abdominal air has otherwise resolved. Fat within both inguinal canals. Musculoskeletal: Again seen degenerative change throughout spine. Unchanged osseous structures. IMPRESSION: 1. Placement of biliary stent with complete resolution of biliary dilatation. 2. Biloma in the gallbladder fossa and adjacent to the left lobe of the liver, unchanged in size from prior CT. There is decreased free fluid tracking in the left upper quadrant and right pericolic gutter. 3. Resolving small bowel ileus. No evidence of obstruction. Colonic diverticulosis without acute inflammation. 4. Decreasing bilateral pleural effusions and bibasilar atelectasis. Electronically Signed   By: Jeb Levering M.D.   On: 11/20/2016 21:26   Ct Abdomen Pelvis W Contrast  Result Date: 11/16/2016 CLINICAL DATA:  Patient with recent cholecystectomy 3 days prior. Dilated intrahepatic and extrahepatic bile ducts. Evaluate for biloma. EXAM: CT ABDOMEN AND PELVIS WITH CONTRAST TECHNIQUE: Multidetector CT imaging of the abdomen and pelvis was performed using the standard protocol following bolus administration of intravenous contrast. CONTRAST:  132mL ISOVUE-300 IOPAMIDOL (ISOVUE-300) INJECTION 61%  COMPARISON:  CT abdomen pelvis 10/21/2016. FINDINGS: Lower chest: Normal heart size. Coronary arterial vascular calcifications. Small layering bilateral pleural effusions. Subpleural ground-glass and consolidative opacities within the lower lobes bilaterally. Hepatobiliary: Liver is normal in size and contour. Postsurgical changes compatible with interval cholecystectomy. There is flocculent fluid demonstrated within the gallbladder fossa. There are a few small foci of gas within the gallbladder fossa. Additionally, there is fluid extending from the gallbladder fossa into the gastrohepatic ligament and about the liver. Additionally fluid extends within the left upper quadrant around the spleen. Mild intrahepatic and extrahepatic biliary ductal dilatation. Pancreas: Mild pancreatic atrophy. Spleen: Unremarkable. Adrenals/Urinary Tract: The adrenal glands are normal. 1.5 cm cyst superior pole right kidney, 1.5 cm cyst inferior pole right kidney and 1.9 cm cyst interpolar region left kidney. There is a 1.5 cm cyst within the superior pole left kidney. Urinary bladder is unremarkable. Stomach/Bowel: Sigmoid colonic diverticulosis. Small amount of free fluid in the pelvis. Free intraperitoneal air within the upper abdomen. Normal appendix. There a few mildly distended loops of small  bowel within the central abdomen. Normal morphology of the stomach. Vascular/Lymphatic: Peripheral calcified atherosclerotic plaque. Retroaortic left renal vein. No retroperitoneal lymphadenopathy. Reproductive: Prostate unremarkable. Other: Small bilateral fat containing inguinal hernias, left-greater-than-right. Musculoskeletal: Multilevel degenerative disc disease. Grade 1 anterolisthesis L4 on L5. IMPRESSION: Postsurgical changes compatible with interval cholecystectomy. Flocculent fluid within the gallbladder fossa likely secondary to postsurgical change/ surgical material. Recommend correlation with operative history. Additionally,  there is fluid extending from the cholecystectomy site into the central abdomen and left upper quadrant which is nonspecific however may be secondary to a bile leak. Recommend correlation with HIDA scan. Small amount of free intraperitoneal air within the upper abdomen likely reflective of recent postoperative state. Mild intrahepatic and extrahepatic biliary ductal dilatation. Aortic atherosclerosis. Mild distention of the small bowel centrally likely secondary to ileus. Small layering bilateral pleural effusions with underlying ground-glass consolidative opacities favored to represent atelectasis. Infection not excluded. These results will be called to the ordering clinician or representative by the Radiologist Assistant, and communication documented in the PACS or zVision Dashboard. Electronically Signed   By: Lovey Newcomer M.D.   On: 11/16/2016 20:04   Dg Ercp Biliary & Pancreatic Ducts  Result Date: 11/17/2016 CLINICAL DATA:  Bile leak with stent placement and sphincterotomy. EXAM: ERCP TECHNIQUE: Multiple spot images obtained with the fluoroscopic device and submitted for interpretation post-procedure. FLUOROSCOPY TIME:  Fluoroscopy Time:  19 minutes and 42 seconds Number of Acquired Spot Images: 8 COMPARISON:  Abdominal CT 11/16/2016. Hepatobiliary examination 11/16/2016 FINDINGS: First set of images demonstrate cannulation and opacification of the main pancreatic duct. The biliary system was cannulated and opacified with contrast. Final image demonstrates a catheter or stent in the common bile duct. Limited evaluation for contrast extravasation. IMPRESSION: Cannulation and opacification of the biliary system for sphincterotomy and stent placement. These images were submitted for radiologic interpretation only. Please see the procedural report for the amount of contrast and the fluoroscopy time utilized. Electronically Signed   By: Markus Daft M.D.   On: 11/17/2016 14:59   Dg Abd Portable 1v  Result  Date: 11/20/2016 CLINICAL DATA:  Abdominal pain and distention. EXAM: PORTABLE ABDOMEN - 1 VIEW COMPARISON:  CT 11/16/2016 FINDINGS: Biliary stent noted. Abdominal gas pattern is negative for bowel obstruction or perforation. IMPRESSION: Normal abdominal gas pattern.  Biliary stent is visible. Electronically Signed   By: Andreas Newport M.D.   On: 11/20/2016 02:18   Dg Cholangiogram  Existing Tube  Result Date: 10/29/2016 INDICATION: Cholecystostomy tube placed on October 05, 2016 ; here for evaluation prior to cholecystectomy. EXAM: CATHETER CHOLANGIOGRAM MEDICATIONS: No antibiotics were employed ANESTHESIA/SEDATION: No conscious sedation was employed. FLUOROSCOPY TIME:  Fluoroscopy Time: 1 minutes 6 seconds (178 mGy). COMPLICATIONS: None immediate. PROCEDURE: The cholecystostomy tube was accessed with a luer lock syringe. Under fluoroscopic guidance a total of 10 cc of a solution of Isovue 300 and sterile saline 1:3 was instilled. The gallbladder was contracted with the catheter noted be intraluminal. No leakage of contrast outside the gallbladder was observed. There is was prompt filling of the cystic duct and common bile duct. The intrahepatic ducts partially filled. The contour of the common bile duct was normal with no filling defects observed. A tiny amount of drainage into the duodenum was observed at the conclusion of the study. 10 cc of contrast and bile were aspirated into the syringe at the conclusion of the study. The patient voiced no discomfort IMPRESSION: Injection of the cholecystostomy tube with filling of a portion of the  gallbladder. The visualized portions of the gallbladder exhibit no inflammatory changes. Normal appearance of the cystic duct and common bile duct with no intraductal stones observed. Electronically Signed   By: David  Martinique M.D.   On: 10/29/2016 09:36   Ct Image Guided Fluid Drain By Catheter  Result Date: 11/22/2016 INDICATION: History of cholecystectomy complicated  by bile leak necessitating placement of a biliary stent. Unfortunately, the patient has developed postoperative fluid collections about the gallbladder fossa and about the anterior inferior aspect the left lobe of the liver. Request made for percutaneous drainage catheter(s) placement. EXAM: 1. CT GUIDED DRAINAGE OF GALLBLADDER FOSSA ABSCESS 2. CT-GUIDED DRAINAGE OF INDETERMINATE FLUID COLLECTION ABOUT THE ANTERIOR CAUDAL ASPECT THE LEFT LOBE OF THE LIVER COMPARISON:  CT abdomen pelvis - 11/20/2016 MEDICATIONS: The patient is currently admitted to the hospital and receiving intravenous antibiotics. The antibiotics were administered within an appropriate time frame prior to the initiation of the procedure. ANESTHESIA/SEDATION: Moderate (conscious) sedation was employed during this procedure. A total of Versed 3 mg and Fentanyl 125 mcg was administered intravenously. Moderate Sedation Time: 30 minutes. The patient's level of consciousness and vital signs were monitored continuously by radiology nursing throughout the procedure under my direct supervision. CONTRAST:  None COMPLICATIONS: None immediate. PROCEDURE: Informed written consent was obtained from the patient and the patient's daughter after a discussion of the risks, benefits and alternatives to treatment. The patient was placed supine on the CT gantry and a pre procedural CT was performed re-demonstrating the known abscess/fluid collection within the gallbladder fossa with dominant component measuring approximately 8.0 x 4.4 cm (image 45, series 2 an additional component about the left lobe of the liver measuring approximately 5.6 x 4.3 cm (image 41, series 2). The procedure was planned. A timeout was performed prior to the initiation of the procedure. The skin overlying the right upper abdominal quadrant was prepped and draped in the usual sterile fashion. The overlying soft tissues were anesthetized with 1% lidocaine with epinephrine. Attention was  initially paid towards placement of the gallbladder fossa abscess drainage catheter. Appropriate trajectory was planned with the use of a 22 gauge spinal needle. An 18 gauge trocar needle was advanced into the abscess/fluid collection and a short Amplatz super stiff wire was coiled within the collection. Appropriate positioning was confirmed with a limited CT scan. The tract was serially dilated allowing placement of a 10 [place] all-purpose drainage catheter. Appropriate positioning was confirmed with a limited postprocedural CT scan. Approximately 50 Ml of blood tinged bilious fluid was aspirated. The tube was connected to a JP bulb and sutured in place. Following placement of the initial percutaneous drainage catheter, CT imaging demonstrated a persistent collection about the anterior inferior aspect the left lobe of the liver no sustained additional percutaneous drainage catheter placement. As such, appropriate trajectory was planned with the use of a 22 gauge spinal needle. An 18 gauge trocar needle was advanced into the abscess/fluid collection and a short Amplatz super stiff wire was coiled within the collection. Appropriate positioning was confirmed with a limited CT scan. The tract was serially dilated allowing placement of a 10 [place] all-purpose drainage catheter. Appropriate positioning was confirmed with a limited postprocedural CT scan. Approximately 10 Ml of serous fluid was aspirated. The tube was connected to a JP bulb and sutured in place. A dressing was placed. The patient tolerated the procedure well without immediate post procedural complication. IMPRESSION: 1. Successful CT guided placement of a 10 [place] all purpose drain catheter into the  gallbladder fossa abscess with aspiration of 50 mL of blood tinged bilious fluid. 2. Successful CT guided placement of a 10 [place] all purpose drain catheter into the indeterminate fluid collection about the anterior inferior aspect the left lobe of the liver  with aspiration of 10 mL of serous fluid. 3. Samples were sent separately to the laboratory as requested by the ordering clinical team. Electronically Signed   By: Sandi Mariscal M.D.   On: 11/22/2016 10:58   Ct Image Guided Drainage By Percutaneous Catheter  Result Date: 11/22/2016 INDICATION: History of cholecystectomy complicated by bile leak necessitating placement of a biliary stent. Unfortunately, the patient has developed postoperative fluid collections about the gallbladder fossa and about the anterior inferior aspect the left lobe of the liver. Request made for percutaneous drainage catheter(s) placement. EXAM: 1. CT GUIDED DRAINAGE OF GALLBLADDER FOSSA ABSCESS 2. CT-GUIDED DRAINAGE OF INDETERMINATE FLUID COLLECTION ABOUT THE ANTERIOR CAUDAL ASPECT THE LEFT LOBE OF THE LIVER COMPARISON:  CT abdomen pelvis - 11/20/2016 MEDICATIONS: The patient is currently admitted to the hospital and receiving intravenous antibiotics. The antibiotics were administered within an appropriate time frame prior to the initiation of the procedure. ANESTHESIA/SEDATION: Moderate (conscious) sedation was employed during this procedure. A total of Versed 3 mg and Fentanyl 125 mcg was administered intravenously. Moderate Sedation Time: 30 minutes. The patient's level of consciousness and vital signs were monitored continuously by radiology nursing throughout the procedure under my direct supervision. CONTRAST:  None COMPLICATIONS: None immediate. PROCEDURE: Informed written consent was obtained from the patient and the patient's daughter after a discussion of the risks, benefits and alternatives to treatment. The patient was placed supine on the CT gantry and a pre procedural CT was performed re-demonstrating the known abscess/fluid collection within the gallbladder fossa with dominant component measuring approximately 8.0 x 4.4 cm (image 45, series 2 an additional component about the left lobe of the liver measuring approximately 5.6  x 4.3 cm (image 41, series 2). The procedure was planned. A timeout was performed prior to the initiation of the procedure. The skin overlying the right upper abdominal quadrant was prepped and draped in the usual sterile fashion. The overlying soft tissues were anesthetized with 1% lidocaine with epinephrine. Attention was initially paid towards placement of the gallbladder fossa abscess drainage catheter. Appropriate trajectory was planned with the use of a 22 gauge spinal needle. An 18 gauge trocar needle was advanced into the abscess/fluid collection and a short Amplatz super stiff wire was coiled within the collection. Appropriate positioning was confirmed with a limited CT scan. The tract was serially dilated allowing placement of a 10 [place] all-purpose drainage catheter. Appropriate positioning was confirmed with a limited postprocedural CT scan. Approximately 50 Ml of blood tinged bilious fluid was aspirated. The tube was connected to a JP bulb and sutured in place. Following placement of the initial percutaneous drainage catheter, CT imaging demonstrated a persistent collection about the anterior inferior aspect the left lobe of the liver no sustained additional percutaneous drainage catheter placement. As such, appropriate trajectory was planned with the use of a 22 gauge spinal needle. An 18 gauge trocar needle was advanced into the abscess/fluid collection and a short Amplatz super stiff wire was coiled within the collection. Appropriate positioning was confirmed with a limited CT scan. The tract was serially dilated allowing placement of a 10 [place] all-purpose drainage catheter. Appropriate positioning was confirmed with a limited postprocedural CT scan. Approximately 10 Ml of serous fluid was aspirated. The tube was connected  to a JP bulb and sutured in place. A dressing was placed. The patient tolerated the procedure well without immediate post procedural complication. IMPRESSION: 1. Successful CT  guided placement of a 10 [place] all purpose drain catheter into the gallbladder fossa abscess with aspiration of 50 mL of blood tinged bilious fluid. 2. Successful CT guided placement of a 10 [place] all purpose drain catheter into the indeterminate fluid collection about the anterior inferior aspect the left lobe of the liver with aspiration of 10 mL of serous fluid. 3. Samples were sent separately to the laboratory as requested by the ordering clinical team. Electronically Signed   By: Sandi Mariscal M.D.   On: 11/22/2016 10:58   US Abdomen Limited Ruq  Result Date: 11/15/2016 CLINICAL DATA:  Abdominal pain EXAM: US ABDOMEN LIMITED - RIGHT UPPER QUADRANT COMPARISON:  CT 1 month 1,016 FINDINGS: Gallbladder: Prior cholecystectomy Common bile duct: Diameter: Dilated, 12 mm. Dilated duct as seen on intraoperative cholangiogram. History new since prior CT. Liver: Intrahepatic biliary ductal dilatation. Increased echotexture throughout the liver. IMPRESSION: Prior cholecystectomy. Dilated intrahepatic and extrahepatic biliary ducts. Common bile duct dilated to 12 mm. Cannot exclude distal obstructing duct stone. This could be further evaluated with ERCP or MRCP. Fatty liver. Electronically Signed   By: Rolm Baptise M.D.   On: 11/15/2016 12:23     LOS: 7 days   Oren Binet, MD  [facility] Pager:[phone]  If 7PM-7AM, please contact night-coverage [email or web address] Password [email or web address] 11/22/2016, 12:05 PM

## 2016-11-22 NOTE — Sedation Documentation (Signed)
o2 d/c 

## 2016-11-22 NOTE — Sedation Documentation (Addendum)
Patient is resting comfortably. 

## 2016-11-22 NOTE — Sedation Documentation (Signed)
Patient denies pain and is resting comfortably.  

## 2016-11-22 NOTE — Procedures (Signed)
Technically successful CT guided placed of a 10 Fr drainage catheter placement into the GB fossa yielding 50 cc of bloody, bilious appearing fluid.  Technically successful CT guided placed of a 10 Fr drainage catheter placement into fluid collection about the anterior aspect of the left lobe of the liver yielding 10 cc of serous appearing fluid.    All aspirated samples sent to the laboratory for analysis.    EBL: Minimal  No immediate post procedural complications.   Ronny Bacon, MD Pager #: [phone]

## 2016-11-22 NOTE — Progress Notes (Signed)
Pt is back from IR post drain placement. JP drains to mid abd and RUQ with dressing dry and intact.

## 2016-11-22 NOTE — Progress Notes (Addendum)
Physical Therapy Treatment Patient Details Name: Henry Lindsey MRN: [id number] DOB: 1933-01-25 Today's Date: 11/22/2016    History of Present Illness Jachob initially had cholecystitis last Thanksgiving. Due to his medical issues, a perc chole drain was placed. He improved medically and underwent lap chole 1/24 by Dr. Hampton Abbot at [facility]. He was discharged from their 1/25. He returned to the emergency room there 1/26 complaining of abdominal pain. Evaluation was concerning for postoperative bile leak. He was transferred to the hospitalist service at [facility]. He underwent hida scan confirming bile leak. He underwent ERCP with stent placement.     PT Comments    Pt ambulated 350' with min hand held assist for balance. He is progressing well with mobility. Encouraged pt to consider using a cane or RW initially at home.   Follow Up Recommendations  No PT follow up;Supervision/Assistance - 24 hour     Equipment Recommendations  None recommended by PT    Recommendations for Other Services       Precautions / Restrictions Precautions Precautions: Fall Restrictions Weight Bearing Restrictions: No    Mobility  Bed Mobility Overal bed mobility: Needs Assistance Bed Mobility: Supine to Sit     Supine to sit: Min assist        Transfers Overall transfer level: Independent Equipment used: None Transfers: Sit to/from Stand Sit to Stand: Min assist         General transfer comment: min A to steady  Ambulation/Gait Ambulation/Gait assistance: Museum/gallery curator (Feet): 350 Feet Assistive device: 1 person hand held assist (IV pole) Gait Pattern/deviations: Step-through pattern;Staggering right;Staggering left Gait velocity: WFL   General Gait Details: HHA of 1 for balance, encouraged pt to consider use of AD at home   Stairs            Wheelchair Mobility    Modified Rankin (Stroke Patients Only)       Balance     Sitting balance-Leahy Scale: Good       Standing balance-Leahy Scale: Fair                      Cognition Arousal/Alertness: Awake/alert Behavior During Therapy: WFL for tasks assessed/performed Overall Cognitive Status: Within Functional Limits for tasks assessed                      Exercises      General Comments        Pertinent Vitals/Pain Pain Score: 6  Pain Location: abdominal pain -at drainage site Pain Descriptors / Indicators: Sore Pain Intervention(s): Limited activity within patient's tolerance;Monitored during session;Patient requesting pain meds-RN notified    Home Living                      Prior Function            PT Goals (current goals can now be found in the care plan section) Acute Rehab PT Goals Patient Stated Goal: return to the gym PT Goal Formulation: With patient Time For Goal Achievement: 11/25/16 Potential to Achieve Goals: Good Progress towards PT goals: Progressing toward goals    Frequency    Min 3X/week      PT Plan Current plan remains appropriate    Co-evaluation             End of Session Equipment Utilized During Treatment: Gait belt Activity Tolerance: Patient limited by pain Patient left: in chair;with call bell/phone within reach  Time: 1410-1431 PT Time Calculation (min) (ACUTE ONLY): 21 min  Charges:  $Gait Training: 8-22 mins                    G Codes:      Philomena Doheny 11/22/2016, 2:48 PM [phone]

## 2016-11-23 LAB — BASIC METABOLIC PANEL
ANION GAP: 9 (ref 5–15)
BUN: 14 mg/dL (ref 6–20)
CALCIUM: 8.5 mg/dL — AB (ref 8.9–10.3)
CO2: 29 mmol/L (ref 22–32)
CREATININE: 0.98 mg/dL (ref 0.61–1.24)
Chloride: 100 mmol/L — ABNORMAL LOW (ref 101–111)
GFR calc Af Amer: >60 mL/min (ref >60)
GLUCOSE: 174 mg/dL — AB (ref 65–99)
Potassium: 3.8 mmol/L (ref 3.5–5.1)
Sodium: 138 mmol/L (ref 135–145)

## 2016-11-23 LAB — CBC
HEMATOCRIT: 32.8 % — AB (ref 39.0–52.0)
Hemoglobin: 10.1 g/dL — ABNORMAL LOW (ref 13.0–17.0)
MCH: 26.2 pg (ref 26.0–34.0)
MCHC: 30.8 g/dL (ref 30.0–36.0)
MCV: 85 fL (ref 78.0–100.0)
Platelets: 307 10*3/uL (ref 150–400)
RBC: 3.86 MIL/uL — AB (ref 4.22–5.81)
RDW: 14.9 % (ref 11.5–15.5)
WBC: 7 10*3/uL (ref 4.0–10.5)

## 2016-11-23 LAB — COMPREHENSIVE METABOLIC PANEL
ALBUMIN: 2 g/dL — AB (ref 3.5–5.0)
ALK PHOS: 186 U/L — AB (ref 38–126)
ALT: 34 U/L (ref 17–63)
AST: 31 U/L (ref 15–41)
Anion gap: 7 (ref 5–15)
BILIRUBIN TOTAL: 0.6 mg/dL (ref 0.3–1.2)
BUN: 14 mg/dL (ref 6–20)
CALCIUM: 8.3 mg/dL — AB (ref 8.9–10.3)
CO2: 29 mmol/L (ref 22–32)
Chloride: 101 mmol/L (ref 101–111)
Creatinine, Ser: 1.08 mg/dL (ref 0.61–1.24)
GFR calc Af Amer: >60 mL/min (ref >60)
GFR calc non Af Amer: >60 mL/min (ref >60)
GLUCOSE: 283 mg/dL — AB (ref 65–99)
Potassium: 3.7 mmol/L (ref 3.5–5.1)
SODIUM: 137 mmol/L (ref 135–145)
TOTAL PROTEIN: 5.7 g/dL — AB (ref 6.5–8.1)

## 2016-11-23 LAB — GLUCOSE, CAPILLARY
GLUCOSE-CAPILLARY: 102 mg/dL — AB (ref 65–99)
Glucose-Capillary: 149 mg/dL — ABNORMAL HIGH (ref 65–99)
Glucose-Capillary: 179 mg/dL — ABNORMAL HIGH (ref 65–99)
Glucose-Capillary: 207 mg/dL — ABNORMAL HIGH (ref 65–99)
Glucose-Capillary: 228 mg/dL — ABNORMAL HIGH (ref 65–99)
Glucose-Capillary: 282 mg/dL — ABNORMAL HIGH (ref 65–99)

## 2016-11-23 MED ORDER — LINAGLIPTIN 5 MG PO TABS
5.0000 mg | ORAL_TABLET | Freq: Every day | ORAL | Status: DC
  Administered 2016-11-23 – 2016-11-24 (×2): 5 mg via ORAL
  Filled 2016-11-23 (×2): qty 1

## 2016-11-23 MED ORDER — GLIPIZIDE 5 MG PO TABS
5.0000 mg | ORAL_TABLET | Freq: Every day | ORAL | Status: DC
  Administered 2016-11-23 – 2016-11-24 (×2): 5 mg via ORAL
  Filled 2016-11-23 (×2): qty 1

## 2016-11-23 NOTE — Progress Notes (Addendum)
PROGRESS NOTE        PATIENT DETAILS Name: Henry Lindsey Age: 83 y.o. Sex: male Date of Birth: 01-11-1933 Admit Date: 11/15/2016 Admitting Physician Vianne Bulls, MD [id number], Aris Everts, MD  Brief Narrative:  Patient is a 83 y.o. male with history of hypertension, insulin-dependent diabetes, CAD who recently had acute cholecystitis and had a percutaneous drain placed initially, and subsequently underwent cholecystectomy at [facility] on 1/24, he presented on 1/26 with worsening abdominal pain. He was initially thought to have possible CBD obstruction, but upon further evaluation was found to have a biliary leak. He subsequently underwent ERCP with stent placement on 1/28. Hospital course has been complicated by persistent abdominal pain. See below for further details  Subjective:  Patient in bed, denies any fevers chills, no headache chest or abdominal pain. Feels better. No focal weakness.  Assessment/Plan:  Abdominal pain due to biliary leak with a Biloma s/p recent cholecystectomy on 1/24 (At [facility]): Underwent ERCP and stent placement on 1/28. Since he continued to have abdominal pain-a repeat  CT scan of the abdomen on 1/31 showed persistent Bilioma- seenby general surgery and IR underwent 2 biliary drain placements on 11-2016, follow fluid culture results.Continue empiric Zosyn-potential stop date on 2/4.  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  History of CAD: Without any anginal symptoms, will resume aspirin, continue statin and beta blocker  History of PAD: Remain stable without any acute issues. Resume cilostazol once all procedures are complete.  Hypertension: Controlled with metoprolol  Dyslipidemia: Continue statin  Malnutrition of moderate degree: Continue supplements  Insulin-dependent type 2 diabetes: CBGs currently stable-continue 18 units of Lantus  and SSI. Resumed half home dose of glipizide along with Januvia replacement.  CBG (last 3)   Recent Labs  11/23/16 0342 11/23/16 0813 11/23/16 1128  GLUCAP 179* 149* 282*      DVT Prophylaxis: Prophylactic Lovenox  Code Status: Full code  Family Communication: Daughter at bedside   Disposition Plan: Remain inpatient-home in 2-3 days  Antimicrobial agents: Anti-infectives    Start     Dose/Rate Route Frequency Ordered Stop   11/16/16 0600  piperacillin-tazobactam (ZOSYN) IVPB 3.375 g     3.375 g 12.5 mL/hr over 240 Minutes Intravenous Every 8 hours 11/15/16 2149     11/15/16 2200  piperacillin-tazobactam (ZOSYN) IVPB 3.375 g     3.375 g 100 mL/hr over 30 Minutes Intravenous  Once 11/15/16 2149 11/15/16 2329      Procedures: ERCP 1/28 IR placed a biliary drain x 2, on 11/22/2016  CONSULTS:  IR, GI and general surgery  Time spent: 25 minutes-Greater than 50% of this time was spent in counseling, explanation of diagnosis, planning of further management, and coordination of care.  MEDICATIONS: Scheduled Meds: . atorvastatin  40 mg Oral q1800  . cholecalciferol  2,000 Units Oral Daily  . enoxaparin (LOVENOX) injection  40 mg Subcutaneous QHS  . feeding supplement (ENSURE ENLIVE)  237 mL Oral BID  . gabapentin  400 mg Oral TID  . insulin aspart  0-9 Units Subcutaneous Q4H  . insulin glargine  8 Units Subcutaneous QHS  . latanoprost  1 drop Both Eyes QHS  . metoprolol succinate  100 mg Oral Daily  . multivitamin with minerals  1 tablet Oral Daily  . pantoprazole  40 mg Oral BID  . piperacillin-tazobactam (ZOSYN)  IV  3.375 g Intravenous Q8H  . saccharomyces boulardii  250 mg Oral BID  . senna  1 tablet Oral BID  . tamsulosin  0.4 mg Oral QPC breakfast   Continuous Infusions:  PRN Meds:.acetaminophen **OR** acetaminophen, bisacodyl, fentaNYL (SUBLIMAZE) injection, HYDROmorphone (DILAUDID) injection, magic mouthwash, ondansetron (ZOFRAN) IV,  oxyCODONE-acetaminophen, polyethylene glycol   PHYSICAL EXAM: Vital signs: Vitals:   11/22/16 0952 11/22/16 1336 11/22/16 2045 11/23/16 0702  BP: (!) 122/59 124/60 (!) 127/52 133/76  Pulse: 87 91 68 70  Resp: 18 18 18 16   Temp: 97.8 F (36.6 C) 98.7 F (37.1 C) 98.1 F (36.7 C) 98.2 F (36.8 C)  TempSrc: Oral Oral Oral Oral  SpO2: 95% 95% 95% 96%  Weight:      Height:       Filed Weights   11/19/16 0419 11/20/16 0553 11/21/16 1048  Weight: 77.8 kg (171 lb 8.3 oz) 76.7 kg (169 lb) 80.4 kg (177 lb 4 oz)   Body mass index is 24.72 kg/m.   General appearance :Awake, alert, not in any distress. Speech Clear.  Eyes:, pupils equally reactive to light and accomodation,no scleral icterus.Pink conjunctiva HEENT: Atraumatic and Normocephalic Neck: supple, no JVD. No cervical lymphadenopathy. No thyromegaly Resp:Good air entry bilaterally, no added sounds  CVS: S1 S2 regular, no murmurs.  GI: Bowel sounds present,Significantly less tender-2 Drains in place. No distention seen.   Extremities: B/L Lower Ext shows no edema, both legs are warm to touch Neurology:  speech clear,Non focal, sensation is grossly intact. Psychiatric: Normal judgment and insight. Alert and oriented x 3. Normal mood. Musculoskeletal:No digital cyanosis Skin:No Rash, warm and dry Wounds:N/A  I have personally reviewed following labs and imaging studies  LABORATORY DATA: CBC:  Recent Labs Lab 11/18/16 0523 11/19/16 0542 11/20/16 0211 11/21/16 0416 11/23/16 0531  WBC 7.2 6.9 9.4 9.7 7.0  HGB 9.8* 9.1* 9.2* 9.6* 10.1*  HCT 31.8* 29.1* 29.6* 30.9* 32.8*  MCV 84.6 85.1 85.1 84.4 85.0  PLT 229 230 249 273 [id number]    Basic Metabolic Panel:  Recent Labs Lab 11/18/16 0523 11/19/16 0542 11/20/16 0211 11/21/16 0416 11/23/16 0531  NA 139 139 137 139 138  K 3.5 3.3* 3.8 4.0 3.8  CL 106 104 102 102 100*  CO2 27 26 26 29 29   GLUCOSE 130* 140* 175* 154* 174*  BUN 12 9 9 8 14   CREATININE 0.95 0.86 0.84  0.91 0.98  CALCIUM 8.1* 7.9* 8.1* 8.6* 8.5*  MG  --   --  1.8  --   --     GFR: Estimated Creatinine Clearance: 63 mL/min (by C-G formula based on SCr of 0.98 mg/dL).  Liver Function Tests:  Recent Labs Lab 11/17/16 0523 11/18/16 0523 11/19/16 0542 11/20/16 0211 11/21/16 0416  AST 24 20 21 29 30   ALT 75* 50 40 40 38  ALKPHOS 160* 145* 154* 183* 209*  BILITOT 1.7* 1.7* 0.8 1.0 0.8  PROT 5.4* 4.9* 5.0* 5.1* 5.6*  ALBUMIN 2.1* 1.9* 1.8* 1.9* 2.0*    Recent Labs Lab 11/20/16 0834  LIPASE 33   No results for input(s): AMMONIA in the last 168 hours.  Coagulation Profile:  Recent Labs Lab 11/21/16 0824  INR 1.22    Cardiac Enzymes: No results for input(s): CKTOTAL, CKMB, CKMBINDEX, TROPONINI in the last 168 hours.  BNP (last 3 results) No results for input(s): PROBNP in the last 8760 hours.  HbA1C: No results for input(s): HGBA1C  in the last 72 hours.  CBG:  Recent Labs Lab 11/22/16 1945 11/23/16 0023 11/23/16 0342 11/23/16 0813 11/23/16 1128  GLUCAP 235* 228* 179* 149* 282*    Lipid Profile: No results for input(s): CHOL, HDL, LDLCALC, TRIG, CHOLHDL, LDLDIRECT in the last 72 hours.  Thyroid Function Tests: No results for input(s): TSH, T4TOTAL, FREET4, T3FREE, THYROIDAB in the last 72 hours.  Anemia Panel: No results for input(s): VITAMINB12, FOLATE, FERRITIN, TIBC, IRON, RETICCTPCT in the last 72 hours.  Urine analysis:    Component Value Date/Time   COLORURINE YELLOW 11/15/2016 0958   APPEARANCEUR CLEAR 11/15/2016 0958   APPEARANCEUR Clear 08/17/2012 1505   LABSPEC 1.020 11/15/2016 0958   LABSPEC 1.017 08/17/2012 1505   PHURINE 7.0 11/15/2016 0958   GLUCOSEU 500 (A) 11/15/2016 0958   GLUCOSEU 50 mg/dL 08/17/2012 1505   HGBUR MODERATE (A) 11/15/2016 0958   BILIRUBINUR SMALL (A) 11/15/2016 0958   BILIRUBINUR neg 10/07/2014 1444   BILIRUBINUR Negative 08/17/2012 1505   KETONESUR 15 (A) 11/15/2016 0958   PROTEINUR 100 (A) 11/15/2016 0958     UROBILINOGEN 0.2 10/07/2014 1444   NITRITE NEGATIVE 11/15/2016 0958   LEUKOCYTESUR NEGATIVE 11/15/2016 0958   LEUKOCYTESUR Negative 08/17/2012 1505    Sepsis Labs: Lactic Acid, Venous    Component Value Date/Time   LATICACIDVEN 1.5 10/21/2016 0036    MICROBIOLOGY: Recent Results (from the past 240 hour(s))  Aerobic/Anaerobic Culture (surgical/deep wound)     Status: None (Preliminary result)   Collection Time: 11/22/16 12:29 PM  Result Value Ref Range Status   Specimen Description ABSCESS GALL BLADDER  Final   Special Requests GB FOSSA ABSCESS LATERAL  Final   Gram Stain   Final    RARE WBC PRESENT, PREDOMINANTLY PMN NO ORGANISMS SEEN    Culture CULTURE REINCUBATED FOR BETTER GROWTH  Final   Report Status PENDING  Incomplete  Aerobic/Anaerobic Culture (surgical/deep wound)     Status: None (Preliminary result)   Collection Time: 11/22/16 12:31 PM  Result Value Ref Range Status   Specimen Description ABSCESS LIVER  Final   Special Requests ANTERIOR INFERIOR LEFT LOBE OF LIVER  Final   Gram Stain   Final    ABUNDANT WBC PRESENT, PREDOMINANTLY PMN RARE YEAST    Culture CULTURE REINCUBATED FOR BETTER GROWTH  Final   Report Status PENDING  Incomplete    RADIOLOGY STUDIES/RESULTS: Dg Cholangiogram Operative  Result Date: 11/13/2016 CLINICAL DATA:  Cholecystectomy. History of percutaneous cholecystostomy tube. EXAM: INTRAOPERATIVE CHOLANGIOGRAM TECHNIQUE: Cholangiographic images from the C-arm fluoroscopic device were submitted for interpretation post-operatively. Please see the procedural report for the amount of contrast and the fluoroscopy time utilized. COMPARISON:  Cholecystostomy tube injection 10/29/2016 FINDINGS: Opacification of the intrahepatic and extrahepatic biliary system. There is moderate dilatation of the biliary system. Question minimal drainage into the duodenum. There appears to be extravasation of contrast near the injection site. Cannot exclude sludge  within the common bile duct. IMPRESSION: Dilatation of the biliary system without significant drainage into the duodenum. Question minimal filling of the duodenum. Findings are concerning for narrowing or obstruction in the distal common bile duct. Electronically Signed   By: Markus Daft M.D.   On: 11/13/2016 14:34   Nm Hepatobiliary Liver Func  Result Date: 11/17/2016 CLINICAL DATA:  82 year old male status post cholecystectomy. Evaluate for bile leak. EXAM: NUCLEAR MEDICINE HEPATOBILIARY IMAGING TECHNIQUE: Sequential images of the abdomen were obtained out to 60 minutes following intravenous administration of radiopharmaceutical. RADIOPHARMACEUTICALS:  5.15 mCi Tc-41m  Choletec IV  COMPARISON:  Abdominal CT dated 11/16/2016 FINDINGS: Prompt uptake and biliary excretion of activity by the liver is seen. There is accumulation of radiotracer outside of the biliary tree corresponding to the fluid collection seen on the CT within the gallbladder fossa as well as sacculation of the radiopharmaceutical within the fluid collection along the inferior surface of the left lobe of the liver. Findings most consistent with contained bite leak or biloma. No extension of radiotracer noted inferiorly within the abdomen. IMPRESSION: Extra-biliary collection of radiopharmaceutical in the gallbladder fossa and along the inferior surface of the left lobe of the liver corresponding to the fluid collections seen on the CT and most consistent with contained bite leak/biloma. Electronically Signed   By: Anner Crete M.D.   On: 11/17/2016 00:41   Ct Abdomen Pelvis W Contrast  Result Date: 11/20/2016 CLINICAL DATA:  Cholecystectomy 1 week prior, biloma. Persistent pain. EXAM: CT ABDOMEN AND PELVIS WITH CONTRAST TECHNIQUE: Multidetector CT imaging of the abdomen and pelvis was performed using the standard protocol following bolus administration of intravenous contrast. CONTRAST:  147mL ISOVUE-300 IOPAMIDOL (ISOVUE-300) INJECTION  61% COMPARISON:  CT 11/16/2016 FINDINGS: Lower chest: Decreased bilateral pleural effusions and associated bibasilar atelectasis. Coronary artery calcifications again seen. Hepatobiliary: Biliary stent in the common bile duct with improved in near completely resolved intra and extrahepatic biliary ductal dilatation. Fluid collection in the gallbladder fossa measures 7 x 4.4 cm and contains heterogeneous density and internal foci of air. Contiguous fluid tracks adjacent to the lateral left lobe of the liver, collection currently measuring 6.6 x 4.5 cm. Tracking free fluid adjacent to the stomach in caudate lobe of the liver, similar to prior, with decreased fluid tracking in the left upper quadrant. Pancreas: No ductal dilatation or inflammation. Spleen: Calcified granuloma.  Decreased perisplenic fluid. Adrenals/Urinary Tract: No adrenal nodule. Bilateral renal cysts are unchanged. No hydronephrosis. Symmetric enhancement and excretion on delayed phase imaging. Urinary bladder is physiologically distended without wall thickening. Stomach/Bowel: Colonic diverticulosis from the splenic flexure to the sigmoid. No diverticulitis. No small bowel dilatation or obstruction. Enteric contrast reaches the colon. Normal appendix. Stomach physiologically distended. Vascular/Lymphatic: Calcified and noncalcified atheromatous plaque throughout the abdominal aorta without aneurysm. retroaortic left renal vein. No bulky adenopathy, small retroperitoneal lymph nodes. Reproductive: Prostate is unremarkable. Other: Small soft tissue nodule in the right anterior abdominal wall may be postsurgical. Trace fluid in the right pericolic gutter, improved from prior exam. Minimal air about the falciform ligament may be secondary to recent ERCP. Free intra-abdominal air has otherwise resolved. Fat within both inguinal canals. Musculoskeletal: Again seen degenerative change throughout spine. Unchanged osseous structures. IMPRESSION: 1.  Placement of biliary stent with complete resolution of biliary dilatation. 2. Biloma in the gallbladder fossa and adjacent to the left lobe of the liver, unchanged in size from prior CT. There is decreased free fluid tracking in the left upper quadrant and right pericolic gutter. 3. Resolving small bowel ileus. No evidence of obstruction. Colonic diverticulosis without acute inflammation. 4. Decreasing bilateral pleural effusions and bibasilar atelectasis. Electronically Signed   By: Jeb Levering M.D.   On: 11/20/2016 21:26   Ct Abdomen Pelvis W Contrast  Result Date: 11/16/2016 CLINICAL DATA:  Patient with recent cholecystectomy 3 days prior. Dilated intrahepatic and extrahepatic bile ducts. Evaluate for biloma. EXAM: CT ABDOMEN AND PELVIS WITH CONTRAST TECHNIQUE: Multidetector CT imaging of the abdomen and pelvis was performed using the standard protocol following bolus administration of intravenous contrast. CONTRAST:  170mL ISOVUE-300 IOPAMIDOL (ISOVUE-300) INJECTION 61%  COMPARISON:  CT abdomen pelvis 10/21/2016. FINDINGS: Lower chest: Normal heart size. Coronary arterial vascular calcifications. Small layering bilateral pleural effusions. Subpleural ground-glass and consolidative opacities within the lower lobes bilaterally. Hepatobiliary: Liver is normal in size and contour. Postsurgical changes compatible with interval cholecystectomy. There is flocculent fluid demonstrated within the gallbladder fossa. There are a few small foci of gas within the gallbladder fossa. Additionally, there is fluid extending from the gallbladder fossa into the gastrohepatic ligament and about the liver. Additionally fluid extends within the left upper quadrant around the spleen. Mild intrahepatic and extrahepatic biliary ductal dilatation. Pancreas: Mild pancreatic atrophy. Spleen: Unremarkable. Adrenals/Urinary Tract: The adrenal glands are normal. 1.5 cm cyst superior pole right kidney, 1.5 cm cyst inferior pole right  kidney and 1.9 cm cyst interpolar region left kidney. There is a 1.5 cm cyst within the superior pole left kidney. Urinary bladder is unremarkable. Stomach/Bowel: Sigmoid colonic diverticulosis. Small amount of free fluid in the pelvis. Free intraperitoneal air within the upper abdomen. Normal appendix. There a few mildly distended loops of small bowel within the central abdomen. Normal morphology of the stomach. Vascular/Lymphatic: Peripheral calcified atherosclerotic plaque. Retroaortic left renal vein. No retroperitoneal lymphadenopathy. Reproductive: Prostate unremarkable. Other: Small bilateral fat containing inguinal hernias, left-greater-than-right. Musculoskeletal: Multilevel degenerative disc disease. Grade 1 anterolisthesis L4 on L5. IMPRESSION: Postsurgical changes compatible with interval cholecystectomy. Flocculent fluid within the gallbladder fossa likely secondary to postsurgical change/ surgical material. Recommend correlation with operative history. Additionally, there is fluid extending from the cholecystectomy site into the central abdomen and left upper quadrant which is nonspecific however may be secondary to a bile leak. Recommend correlation with HIDA scan. Small amount of free intraperitoneal air within the upper abdomen likely reflective of recent postoperative state. Mild intrahepatic and extrahepatic biliary ductal dilatation. Aortic atherosclerosis. Mild distention of the small bowel centrally likely secondary to ileus. Small layering bilateral pleural effusions with underlying ground-glass consolidative opacities favored to represent atelectasis. Infection not excluded. These results will be called to the ordering clinician or representative by the Radiologist Assistant, and communication documented in the PACS or zVision Dashboard. Electronically Signed   By: Lovey Newcomer M.D.   On: 11/16/2016 20:04   Dg Ercp Biliary & Pancreatic Ducts  Result Date: 11/17/2016 CLINICAL DATA:  Bile  leak with stent placement and sphincterotomy. EXAM: ERCP TECHNIQUE: Multiple spot images obtained with the fluoroscopic device and submitted for interpretation post-procedure. FLUOROSCOPY TIME:  Fluoroscopy Time:  19 minutes and 42 seconds Number of Acquired Spot Images: 8 COMPARISON:  Abdominal CT 11/16/2016. Hepatobiliary examination 11/16/2016 FINDINGS: First set of images demonstrate cannulation and opacification of the main pancreatic duct. The biliary system was cannulated and opacified with contrast. Final image demonstrates a catheter or stent in the common bile duct. Limited evaluation for contrast extravasation. IMPRESSION: Cannulation and opacification of the biliary system for sphincterotomy and stent placement. These images were submitted for radiologic interpretation only. Please see the procedural report for the amount of contrast and the fluoroscopy time utilized. Electronically Signed   By: Markus Daft M.D.   On: 11/17/2016 14:59   Dg Abd Portable 1v  Result Date: 11/20/2016 CLINICAL DATA:  Abdominal pain and distention. EXAM: PORTABLE ABDOMEN - 1 VIEW COMPARISON:  CT 11/16/2016 FINDINGS: Biliary stent noted. Abdominal gas pattern is negative for bowel obstruction or perforation. IMPRESSION: Normal abdominal gas pattern.  Biliary stent is visible. Electronically Signed   By: Andreas Newport M.D.   On: 11/20/2016 02:18   Dg Cholangiogram  Existing  Tube  Result Date: 10/29/2016 INDICATION: Cholecystostomy tube placed on October 05, 2016 ; here for evaluation prior to cholecystectomy. EXAM: CATHETER CHOLANGIOGRAM MEDICATIONS: No antibiotics were employed ANESTHESIA/SEDATION: No conscious sedation was employed. FLUOROSCOPY TIME:  Fluoroscopy Time: 1 minutes 6 seconds (178 mGy). COMPLICATIONS: None immediate. PROCEDURE: The cholecystostomy tube was accessed with a luer lock syringe. Under fluoroscopic guidance a total of 10 cc of a solution of Isovue 300 and sterile saline 1:3 was instilled. The  gallbladder was contracted with the catheter noted be intraluminal. No leakage of contrast outside the gallbladder was observed. There is was prompt filling of the cystic duct and common bile duct. The intrahepatic ducts partially filled. The contour of the common bile duct was normal with no filling defects observed. A tiny amount of drainage into the duodenum was observed at the conclusion of the study. 10 cc of contrast and bile were aspirated into the syringe at the conclusion of the study. The patient voiced no discomfort IMPRESSION: Injection of the cholecystostomy tube with filling of a portion of the gallbladder. The visualized portions of the gallbladder exhibit no inflammatory changes. Normal appearance of the cystic duct and common bile duct with no intraductal stones observed. Electronically Signed   By: David  Martinique M.D.   On: 10/29/2016 09:36   Ct Image Guided Fluid Drain By Catheter  Result Date: 11/22/2016 INDICATION: History of cholecystectomy complicated by bile leak necessitating placement of a biliary stent. Unfortunately, the patient has developed postoperative fluid collections about the gallbladder fossa and about the anterior inferior aspect the left lobe of the liver. Request made for percutaneous drainage catheter(s) placement. EXAM: 1. CT GUIDED DRAINAGE OF GALLBLADDER FOSSA ABSCESS 2. CT-GUIDED DRAINAGE OF INDETERMINATE FLUID COLLECTION ABOUT THE ANTERIOR CAUDAL ASPECT THE LEFT LOBE OF THE LIVER COMPARISON:  CT abdomen pelvis - 11/20/2016 MEDICATIONS: The patient is currently admitted to the hospital and receiving intravenous antibiotics. The antibiotics were administered within an appropriate time frame prior to the initiation of the procedure. ANESTHESIA/SEDATION: Moderate (conscious) sedation was employed during this procedure. A total of Versed 3 mg and Fentanyl 125 mcg was administered intravenously. Moderate Sedation Time: 30 minutes. The patient's level of consciousness and  vital signs were monitored continuously by radiology nursing throughout the procedure under my direct supervision. CONTRAST:  None COMPLICATIONS: None immediate. PROCEDURE: Informed written consent was obtained from the patient and the patient's daughter after a discussion of the risks, benefits and alternatives to treatment. The patient was placed supine on the CT gantry and a pre procedural CT was performed re-demonstrating the known abscess/fluid collection within the gallbladder fossa with dominant component measuring approximately 8.0 x 4.4 cm (image 45, series 2 an additional component about the left lobe of the liver measuring approximately 5.6 x 4.3 cm (image 41, series 2). The procedure was planned. A timeout was performed prior to the initiation of the procedure. The skin overlying the right upper abdominal quadrant was prepped and draped in the usual sterile fashion. The overlying soft tissues were anesthetized with 1% lidocaine with epinephrine. Attention was initially paid towards placement of the gallbladder fossa abscess drainage catheter. Appropriate trajectory was planned with the use of a 22 gauge spinal needle. An 18 gauge trocar needle was advanced into the abscess/fluid collection and a short Amplatz super stiff wire was coiled within the collection. Appropriate positioning was confirmed with a limited CT scan. The tract was serially dilated allowing placement of a 10 [place] all-purpose drainage catheter. Appropriate positioning was confirmed with  a limited postprocedural CT scan. Approximately 50 Ml of blood tinged bilious fluid was aspirated. The tube was connected to a JP bulb and sutured in place. Following placement of the initial percutaneous drainage catheter, CT imaging demonstrated a persistent collection about the anterior inferior aspect the left lobe of the liver no sustained additional percutaneous drainage catheter placement. As such, appropriate trajectory was planned with the use  of a 22 gauge spinal needle. An 18 gauge trocar needle was advanced into the abscess/fluid collection and a short Amplatz super stiff wire was coiled within the collection. Appropriate positioning was confirmed with a limited CT scan. The tract was serially dilated allowing placement of a 10 [place] all-purpose drainage catheter. Appropriate positioning was confirmed with a limited postprocedural CT scan. Approximately 10 Ml of serous fluid was aspirated. The tube was connected to a JP bulb and sutured in place. A dressing was placed. The patient tolerated the procedure well without immediate post procedural complication. IMPRESSION: 1. Successful CT guided placement of a 10 [place] all purpose drain catheter into the gallbladder fossa abscess with aspiration of 50 mL of blood tinged bilious fluid. 2. Successful CT guided placement of a 10 [place] all purpose drain catheter into the indeterminate fluid collection about the anterior inferior aspect the left lobe of the liver with aspiration of 10 mL of serous fluid. 3. Samples were sent separately to the laboratory as requested by the ordering clinical team. Electronically Signed   By: Sandi Mariscal M.D.   On: 11/22/2016 10:58   Ct Image Guided Drainage By Percutaneous Catheter  Result Date: 11/22/2016 INDICATION: History of cholecystectomy complicated by bile leak necessitating placement of a biliary stent. Unfortunately, the patient has developed postoperative fluid collections about the gallbladder fossa and about the anterior inferior aspect the left lobe of the liver. Request made for percutaneous drainage catheter(s) placement. EXAM: 1. CT GUIDED DRAINAGE OF GALLBLADDER FOSSA ABSCESS 2. CT-GUIDED DRAINAGE OF INDETERMINATE FLUID COLLECTION ABOUT THE ANTERIOR CAUDAL ASPECT THE LEFT LOBE OF THE LIVER COMPARISON:  CT abdomen pelvis - 11/20/2016 MEDICATIONS: The patient is currently admitted to the hospital and receiving intravenous antibiotics. The antibiotics were  administered within an appropriate time frame prior to the initiation of the procedure. ANESTHESIA/SEDATION: Moderate (conscious) sedation was employed during this procedure. A total of Versed 3 mg and Fentanyl 125 mcg was administered intravenously. Moderate Sedation Time: 30 minutes. The patient's level of consciousness and vital signs were monitored continuously by radiology nursing throughout the procedure under my direct supervision. CONTRAST:  None COMPLICATIONS: None immediate. PROCEDURE: Informed written consent was obtained from the patient and the patient's daughter after a discussion of the risks, benefits and alternatives to treatment. The patient was placed supine on the CT gantry and a pre procedural CT was performed re-demonstrating the known abscess/fluid collection within the gallbladder fossa with dominant component measuring approximately 8.0 x 4.4 cm (image 45, series 2 an additional component about the left lobe of the liver measuring approximately 5.6 x 4.3 cm (image 41, series 2). The procedure was planned. A timeout was performed prior to the initiation of the procedure. The skin overlying the right upper abdominal quadrant was prepped and draped in the usual sterile fashion. The overlying soft tissues were anesthetized with 1% lidocaine with epinephrine. Attention was initially paid towards placement of the gallbladder fossa abscess drainage catheter. Appropriate trajectory was planned with the use of a 22 gauge spinal needle. An 18 gauge trocar needle was advanced into the abscess/fluid collection and  a short Amplatz super stiff wire was coiled within the collection. Appropriate positioning was confirmed with a limited CT scan. The tract was serially dilated allowing placement of a 10 [place] all-purpose drainage catheter. Appropriate positioning was confirmed with a limited postprocedural CT scan. Approximately 50 Ml of blood tinged bilious fluid was aspirated. The tube was connected to a  JP bulb and sutured in place. Following placement of the initial percutaneous drainage catheter, CT imaging demonstrated a persistent collection about the anterior inferior aspect the left lobe of the liver no sustained additional percutaneous drainage catheter placement. As such, appropriate trajectory was planned with the use of a 22 gauge spinal needle. An 18 gauge trocar needle was advanced into the abscess/fluid collection and a short Amplatz super stiff wire was coiled within the collection. Appropriate positioning was confirmed with a limited CT scan. The tract was serially dilated allowing placement of a 10 [place] all-purpose drainage catheter. Appropriate positioning was confirmed with a limited postprocedural CT scan. Approximately 10 Ml of serous fluid was aspirated. The tube was connected to a JP bulb and sutured in place. A dressing was placed. The patient tolerated the procedure well without immediate post procedural complication. IMPRESSION: 1. Successful CT guided placement of a 10 [place] all purpose drain catheter into the gallbladder fossa abscess with aspiration of 50 mL of blood tinged bilious fluid. 2. Successful CT guided placement of a 10 [place] all purpose drain catheter into the indeterminate fluid collection about the anterior inferior aspect the left lobe of the liver with aspiration of 10 mL of serous fluid. 3. Samples were sent separately to the laboratory as requested by the ordering clinical team. Electronically Signed   By: Sandi Mariscal M.D.   On: 11/22/2016 10:58   US Abdomen Limited Ruq  Result Date: 11/15/2016 CLINICAL DATA:  Abdominal pain EXAM: US ABDOMEN LIMITED - RIGHT UPPER QUADRANT COMPARISON:  CT 1 month 1,016 FINDINGS: Gallbladder: Prior cholecystectomy Common bile duct: Diameter: Dilated, 12 mm. Dilated duct as seen on intraoperative cholangiogram. History new since prior CT. Liver: Intrahepatic biliary ductal dilatation. Increased echotexture throughout the liver.  IMPRESSION: Prior cholecystectomy. Dilated intrahepatic and extrahepatic biliary ducts. Common bile duct dilated to 12 mm. Cannot exclude distal obstructing duct stone. This could be further evaluated with ERCP or MRCP. Fatty liver. Electronically Signed   By: Rolm Baptise M.D.   On: 11/15/2016 12:23     LOS: 8 days   Thurnell Lose, MD  [facility] Pager:[phone]  If 7PM-7AM, please contact night-coverage [email or web address] Password [email or web address] 11/23/2016, 11:58 AM

## 2016-11-23 NOTE — Progress Notes (Signed)
Referring Physician(s): [place]  Supervising Physician: Sandi Mariscal  Patient Status:  [facility] - In-pt  Chief Complaint:  Abdominal fluid collections  Subjective: Patient feeling a little bit better today however he continues to have some right greater than left upper abdominal soreness. Denies nausea/ vomiting. States he is ready to go walking.   Allergies: Patient has no known allergies.  Medications: Prior to Admission medications   Medication Sig Start Date End Date Taking? Authorizing Provider  Alum & Mag Hydroxide-Simeth (MAGIC MOUTHWASH) SOLN Take 5 mLs by mouth 3 (three) times daily as needed for mouth pain.    Historical Provider, MD  amLODipine (NORVASC) 10 MG tablet Take 1 tablet (10 mg total) by mouth daily. 11/11/16   Minna Merritts, MD  aspirin 81 MG tablet Take 1 tablet (81 mg total) by mouth daily. 11/05/16   Minna Merritts, MD  atorvastatin (LIPITOR) 40 MG tablet TAKE 1 TABLET BY MOUTH EVERY DAY 07/15/16   [name], MD  Blood Glucose Monitoring Suppl (ONE TOUCH ULTRA SYSTEM KIT) w/Device KIT 1 kit by Does not apply route once. Use DX code E11.59 10/18/15   [name], MD  Cholecalciferol (VITAMIN D3) 2000 units TABS Take 1 tablet by mouth daily.    Historical Provider, MD  cilostazol (PLETAL) 100 MG tablet TAKE 1 TABLET(100 MG) BY MOUTH TWICE DAILY 08/13/16   [name], MD  famotidine (PEPCID) 20 MG tablet TAKE 1 TABLET(20 MG) BY MOUTH TWICE DAILY 05/08/16   [name], MD  fenofibrate micronized (LOFIBRA) 134 MG capsule TAKE 1 CAPSULE BY MOUTH EVERY MORNING BEFORE BREAKFAST Patient taking differently: TAKE 1 CAPSULE tid 08/28/16   [name], MD  gabapentin (NEURONTIN) 400 MG capsule TAKE 1 CAPSULE(400 MG) BY MOUTH THREE TIMES DAILY 09/09/16   [name], MD  glipiZIDE (GLUCOTROL) 10 MG tablet TAKE 1 TABLET(10 MG) BY MOUTH TWICE DAILY 03/27/16   [name], MD  glucose blood test strip Use as instructed three times daily 05/03/16    [name], MD  HYDROcodone-acetaminophen (NORCO/VICODIN) 5-325 MG tablet Take 1-2 tablets by mouth every 6 (six) hours as needed for moderate pain. 11/13/16   Olean Ree, MD  INS SYRINGE/NEEDLE 1CC/28G (B-D INSULIN SYRINGE 1CC/28G) 28G X 1/2" 1 ML MISC USE TO ADMINISTER INSULIN DAILY 02/13/16   [name], MD  insulin lispro protamine-lispro (HUMALOG MIX 75/25) (75-25) 100 UNIT/ML SUSP injection Inject 10 units right before  breakfast and 6 units before dinner Patient taking differently: 6-8 Units 2 (two) times daily. Inject 6 qam and 8 qpm 05/03/16   [name], MD  JANUVIA 100 MG tablet TAKE 1 TABLET(100 MG) BY MOUTH DAILY. NOTE DOSE INCREASE 08/13/16   [name], MD  latanoprost (XALATAN) 0.005 % ophthalmic solution INSTILL 1 DROP INTO BOTH EYES QD 11/01/15   Historical Provider, MD  metFORMIN (GLUCOPHAGE) 1000 MG tablet TAKE 1 TABLET BY MOUTH TWICE DAILY 07/26/16   [name], MD  metoprolol succinate (TOPROL-XL) 100 MG 24 hr tablet Take 1 tablet (100 mg total) by mouth daily. Take with or immediately following a meal. 11/11/16   Minna Merritts, MD  Multiple Vitamin (MULTIVITAMIN WITH MINERALS) TABS tablet Take 1 tablet by mouth daily.    Historical Provider, MD  nitroGLYCERIN (NITROSTAT) 0.4 MG SL tablet Place 1 tablet (0.4 mg total) under the tongue every 5 (five) minutes as needed for chest pain. MAXIMUM 3 TABLETS 10/04/16   Helene Kelp  Ether Griffins, MD  ondansetron (ZOFRAN) 4 MG tablet Take 1 tablet (4 mg total) by mouth every 8 (eight) hours as needed for nausea or vomiting. Patient not taking: Reported on 11/11/2016 09/03/16   [name], MD  [facility] DELICA LANCETS 36R MISC Use three times daily to check blood sugar. 10/18/15   [name], MD  pantoprazole (PROTONIX) 40 MG tablet TAKE 1 TABLET(40 MG) BY MOUTH TWICE DAILY BEFORE A MEAL 11/08/16   [name], MD  tamsulosin (FLOMAX) 0.4 MG CAPS capsule Take 0.4 mg by mouth daily after breakfast.    Historical Provider, MD    traMADol (ULTRAM) 50 MG tablet Take 50 mg by mouth at bedtime.  11/24/13   Historical Provider, MD     Vital Signs: BP 133/76 (BP Location: Right Arm)   Pulse 70   Temp 98.2 F (36.8 C) (Oral)   Resp 16   Ht _0  (1.803 m)   Wt 177 lb 4 oz (80.4 kg)   SpO2 96%   BMI 24.72 kg/m   Physical Exam awake, alert. Right abdominal drain with 90 mL output of slightly blood-tinged fluid. Dressing dry, site mild to moderately tender. It abdominal drain intact, yellow fluid and JP bulb, output 55 mL, site nontender  Imaging: Ct Abdomen Pelvis W Contrast  Result Date: 11/20/2016 CLINICAL DATA:  Cholecystectomy 1 week prior, biloma. Persistent pain. EXAM: CT ABDOMEN AND PELVIS WITH CONTRAST TECHNIQUE: Multidetector CT imaging of the abdomen and pelvis was performed using the standard protocol following bolus administration of intravenous contrast. CONTRAST:  190m ISOVUE-300 IOPAMIDOL (ISOVUE-300) INJECTION 61% COMPARISON:  CT 11/16/2016 FINDINGS: Lower chest: Decreased bilateral pleural effusions and associated bibasilar atelectasis. Coronary artery calcifications again seen. Hepatobiliary: Biliary stent in the common bile duct with improved in near completely resolved intra and extrahepatic biliary ductal dilatation. Fluid collection in the gallbladder fossa measures 7 x 4.4 cm and contains heterogeneous density and internal foci of air. Contiguous fluid tracks adjacent to the lateral left lobe of the liver, collection currently measuring 6.6 x 4.5 cm. Tracking free fluid adjacent to the stomach in caudate lobe of the liver, similar to prior, with decreased fluid tracking in the left upper quadrant. Pancreas: No ductal dilatation or inflammation. Spleen: Calcified granuloma.  Decreased perisplenic fluid. Adrenals/Urinary Tract: No adrenal nodule. Bilateral renal cysts are unchanged. No hydronephrosis. Symmetric enhancement and excretion on delayed phase imaging. Urinary bladder is physiologically distended  without wall thickening. Stomach/Bowel: Colonic diverticulosis from the splenic flexure to the sigmoid. No diverticulitis. No small bowel dilatation or obstruction. Enteric contrast reaches the colon. Normal appendix. Stomach physiologically distended. Vascular/Lymphatic: Calcified and noncalcified atheromatous plaque throughout the abdominal aorta without aneurysm. retroaortic left renal vein. No bulky adenopathy, small retroperitoneal lymph nodes. Reproductive: Prostate is unremarkable. Other: Small soft tissue nodule in the right anterior abdominal wall may be postsurgical. Trace fluid in the right pericolic gutter, improved from prior exam. Minimal air about the falciform ligament may be secondary to recent ERCP. Free intra-abdominal air has otherwise resolved. Fat within both inguinal canals. Musculoskeletal: Again seen degenerative change throughout spine. Unchanged osseous structures. IMPRESSION: 1. Placement of biliary stent with complete resolution of biliary dilatation. 2. Biloma in the gallbladder fossa and adjacent to the left lobe of the liver, unchanged in size from prior CT. There is decreased free fluid tracking in the left upper quadrant and right pericolic gutter. 3. Resolving small bowel ileus. No evidence of obstruction. Colonic diverticulosis without acute inflammation. 4. Decreasing bilateral  pleural effusions and bibasilar atelectasis. Electronically Signed   By: Jeb Levering M.D.   On: 11/20/2016 21:26   Dg Abd Portable 1v  Result Date: 11/20/2016 CLINICAL DATA:  Abdominal pain and distention. EXAM: PORTABLE ABDOMEN - 1 VIEW COMPARISON:  CT 11/16/2016 FINDINGS: Biliary stent noted. Abdominal gas pattern is negative for bowel obstruction or perforation. IMPRESSION: Normal abdominal gas pattern.  Biliary stent is visible. Electronically Signed   By: Andreas Newport M.D.   On: 11/20/2016 02:18   Ct Image Guided Fluid Drain By Catheter  Result Date: 11/22/2016 INDICATION: History of  cholecystectomy complicated by bile leak necessitating placement of a biliary stent. Unfortunately, the patient has developed postoperative fluid collections about the gallbladder fossa and about the anterior inferior aspect the left lobe of the liver. Request made for percutaneous drainage catheter(s) placement. EXAM: 1. CT GUIDED DRAINAGE OF GALLBLADDER FOSSA ABSCESS 2. CT-GUIDED DRAINAGE OF INDETERMINATE FLUID COLLECTION ABOUT THE ANTERIOR CAUDAL ASPECT THE LEFT LOBE OF THE LIVER COMPARISON:  CT abdomen pelvis - 11/20/2016 MEDICATIONS: The patient is currently admitted to the hospital and receiving intravenous antibiotics. The antibiotics were administered within an appropriate time frame prior to the initiation of the procedure. ANESTHESIA/SEDATION: Moderate (conscious) sedation was employed during this procedure. A total of Versed 3 mg and Fentanyl 125 mcg was administered intravenously. Moderate Sedation Time: 30 minutes. The patient's level of consciousness and vital signs were monitored continuously by radiology nursing throughout the procedure under my direct supervision. CONTRAST:  None COMPLICATIONS: None immediate. PROCEDURE: Informed written consent was obtained from the patient and the patient's daughter after a discussion of the risks, benefits and alternatives to treatment. The patient was placed supine on the CT gantry and a pre procedural CT was performed re-demonstrating the known abscess/fluid collection within the gallbladder fossa with dominant component measuring approximately 8.0 x 4.4 cm (image 45, series 2 an additional component about the left lobe of the liver measuring approximately 5.6 x 4.3 cm (image 41, series 2). The procedure was planned. A timeout was performed prior to the initiation of the procedure. The skin overlying the right upper abdominal quadrant was prepped and draped in the usual sterile fashion. The overlying soft tissues were anesthetized with 1% lidocaine with  epinephrine. Attention was initially paid towards placement of the gallbladder fossa abscess drainage catheter. Appropriate trajectory was planned with the use of a 22 gauge spinal needle. An 18 gauge trocar needle was advanced into the abscess/fluid collection and a short Amplatz super stiff wire was coiled within the collection. Appropriate positioning was confirmed with a limited CT scan. The tract was serially dilated allowing placement of a 10 [place] all-purpose drainage catheter. Appropriate positioning was confirmed with a limited postprocedural CT scan. Approximately 50 Ml of blood tinged bilious fluid was aspirated. The tube was connected to a JP bulb and sutured in place. Following placement of the initial percutaneous drainage catheter, CT imaging demonstrated a persistent collection about the anterior inferior aspect the left lobe of the liver no sustained additional percutaneous drainage catheter placement. As such, appropriate trajectory was planned with the use of a 22 gauge spinal needle. An 18 gauge trocar needle was advanced into the abscess/fluid collection and a short Amplatz super stiff wire was coiled within the collection. Appropriate positioning was confirmed with a limited CT scan. The tract was serially dilated allowing placement of a 10 [place] all-purpose drainage catheter. Appropriate positioning was confirmed with a limited postprocedural CT scan. Approximately 10 Ml of serous fluid was  aspirated. The tube was connected to a JP bulb and sutured in place. A dressing was placed. The patient tolerated the procedure well without immediate post procedural complication. IMPRESSION: 1. Successful CT guided placement of a 10 [place] all purpose drain catheter into the gallbladder fossa abscess with aspiration of 50 mL of blood tinged bilious fluid. 2. Successful CT guided placement of a 10 [place] all purpose drain catheter into the indeterminate fluid collection about the anterior inferior aspect  the left lobe of the liver with aspiration of 10 mL of serous fluid. 3. Samples were sent separately to the laboratory as requested by the ordering clinical team. Electronically Signed   By: Sandi Mariscal M.D.   On: 11/22/2016 10:58   Ct Image Guided Drainage By Percutaneous Catheter  Result Date: 11/22/2016 INDICATION: History of cholecystectomy complicated by bile leak necessitating placement of a biliary stent. Unfortunately, the patient has developed postoperative fluid collections about the gallbladder fossa and about the anterior inferior aspect the left lobe of the liver. Request made for percutaneous drainage catheter(s) placement. EXAM: 1. CT GUIDED DRAINAGE OF GALLBLADDER FOSSA ABSCESS 2. CT-GUIDED DRAINAGE OF INDETERMINATE FLUID COLLECTION ABOUT THE ANTERIOR CAUDAL ASPECT THE LEFT LOBE OF THE LIVER COMPARISON:  CT abdomen pelvis - 11/20/2016 MEDICATIONS: The patient is currently admitted to the hospital and receiving intravenous antibiotics. The antibiotics were administered within an appropriate time frame prior to the initiation of the procedure. ANESTHESIA/SEDATION: Moderate (conscious) sedation was employed during this procedure. A total of Versed 3 mg and Fentanyl 125 mcg was administered intravenously. Moderate Sedation Time: 30 minutes. The patient's level of consciousness and vital signs were monitored continuously by radiology nursing throughout the procedure under my direct supervision. CONTRAST:  None COMPLICATIONS: None immediate. PROCEDURE: Informed written consent was obtained from the patient and the patient's daughter after a discussion of the risks, benefits and alternatives to treatment. The patient was placed supine on the CT gantry and a pre procedural CT was performed re-demonstrating the known abscess/fluid collection within the gallbladder fossa with dominant component measuring approximately 8.0 x 4.4 cm (image 45, series 2 an additional component about the left lobe of the liver  measuring approximately 5.6 x 4.3 cm (image 41, series 2). The procedure was planned. A timeout was performed prior to the initiation of the procedure. The skin overlying the right upper abdominal quadrant was prepped and draped in the usual sterile fashion. The overlying soft tissues were anesthetized with 1% lidocaine with epinephrine. Attention was initially paid towards placement of the gallbladder fossa abscess drainage catheter. Appropriate trajectory was planned with the use of a 22 gauge spinal needle. An 18 gauge trocar needle was advanced into the abscess/fluid collection and a short Amplatz super stiff wire was coiled within the collection. Appropriate positioning was confirmed with a limited CT scan. The tract was serially dilated allowing placement of a 10 [place] all-purpose drainage catheter. Appropriate positioning was confirmed with a limited postprocedural CT scan. Approximately 50 Ml of blood tinged bilious fluid was aspirated. The tube was connected to a JP bulb and sutured in place. Following placement of the initial percutaneous drainage catheter, CT imaging demonstrated a persistent collection about the anterior inferior aspect the left lobe of the liver no sustained additional percutaneous drainage catheter placement. As such, appropriate trajectory was planned with the use of a 22 gauge spinal needle. An 18 gauge trocar needle was advanced into the abscess/fluid collection and a short Amplatz super stiff wire was coiled within the collection. Appropriate  positioning was confirmed with a limited CT scan. The tract was serially dilated allowing placement of a 10 [place] all-purpose drainage catheter. Appropriate positioning was confirmed with a limited postprocedural CT scan. Approximately 10 Ml of serous fluid was aspirated. The tube was connected to a JP bulb and sutured in place. A dressing was placed. The patient tolerated the procedure well without immediate post procedural complication.  IMPRESSION: 1. Successful CT guided placement of a 10 [place] all purpose drain catheter into the gallbladder fossa abscess with aspiration of 50 mL of blood tinged bilious fluid. 2. Successful CT guided placement of a 10 [place] all purpose drain catheter into the indeterminate fluid collection about the anterior inferior aspect the left lobe of the liver with aspiration of 10 mL of serous fluid. 3. Samples were sent separately to the laboratory as requested by the ordering clinical team. Electronically Signed   By: Sandi Mariscal M.D.   On: 11/22/2016 10:58    Labs:  CBC:  Recent Labs  11/19/16 0542 11/20/16 0211 11/21/16 0416 11/23/16 0531  WBC 6.9 9.4 9.7 7.0  HGB 9.1* 9.2* 9.6* 10.1*  HCT 29.1* 29.6* 30.9* 32.8*  PLT 230 249 273 307    COAGS:  Recent Labs  10/06/16 0232 10/08/16 0752 10/20/16 2159 11/16/16 0725 11/21/16 0824  INR 1.43 1.05 1.23 1.37 1.22  APTT 36  --   --   --   --     BMP:  Recent Labs  11/19/16 0542 11/20/16 0211 11/21/16 0416 11/23/16 0531  NA 139 137 139 138  K 3.3* 3.8 4.0 3.8  CL 104 102 102 100*  CO2 _0 GLUCOSE 140* 175* 154* 174*  BUN _1 CALCIUM 7.9* 8.1* 8.6* 8.5*  CREATININE 0.86 0.84 0.91 0.98  GFRNONAA >60 >60 >60 >60  GFRAA >60 >60 >60 >60    LIVER FUNCTION TESTS:  Recent Labs  11/18/16 0523 11/19/16 0542 11/20/16 0211 11/21/16 0416  BILITOT 1.7* 0.8 1.0 0.8  AST _2 ALT 50 40 40 38  ALKPHOS 145* 154* 183* 209*  PROT 4.9* 5.0* 5.1* 5.6*  ALBUMIN 1.9* 1.8* 1.9* 2.0*    Assessment and Plan: Status post cholecystectomy with bile leak and biliary stent placement; status post drainage of gallbladder fossa and left perihepatic fluid collections on 2/2. Afebrile. Drained fluid cultures pending, creatinine normal, WBC normal, hemoglobin stable. Continue with drain irrigation and monitoring of output. Recommend follow-up CT within 1 week of drain placement or if clinical status worsens. Other plans per  primary and [facility].   Electronically Signed: D. Rowe Robert 11/23/2016, 10:24 AM   I spent a total of 15 minutes at the the patient's bedside AND on the patient's hospital floor or unit, greater than 50% of which was counseling/coordinating care for abdominal drains.    Patient ID: JENE ORAVEC, male   DOB: [birth year], 83 y.o.   MRN: [id number]

## 2016-11-23 NOTE — Care Management Note (Signed)
Case Management Note  Patient Details  Name: [name] MRN: [id number] Date of Birth: May 07, 1933  Subjective/Objective:                  Abdominal pain due to biliary leak with a Biloma s/p recent cholecystectomy on 1/24 Action/Plan: Discharge planning Expected Discharge Date:                  Expected Discharge Plan:  [place]  In-House Referral:     Discharge planning Services  CM Consult  Post Acute Care Choice:  Home Health Choice offered to:  Patient  DME Arranged:  3-N-1, Walker rolling DME Agency:  [place]:  RN, PT, OT, Nurse's Aide, Social Work [employer] Agency:  [facility] (formerly [employer])  Status of Service:  Completed, signed off  If discussed at [employer] [facility] [employer], dates discussed:    Additional Comments: CM met with pt in room to offer choice of home health agency. Pt chooses [facility]. Referral called to [facility] rep, Joyce.  CM notified [place] DME rep, Jeneen Rinks to please deliver the rolling walker to room prior to discharge.   Dellie Catholic, RN 11/23/2016, 11:58 AM

## 2016-11-24 LAB — CBC
HEMATOCRIT: 32.2 % — AB (ref 39.0–52.0)
HEMOGLOBIN: 9.8 g/dL — AB (ref 13.0–17.0)
MCH: 25.7 pg — ABNORMAL LOW (ref 26.0–34.0)
MCHC: 30.4 g/dL (ref 30.0–36.0)
MCV: 84.5 fL (ref 78.0–100.0)
Platelets: 355 10*3/uL (ref 150–400)
RBC: 3.81 MIL/uL — AB (ref 4.22–5.81)
RDW: 14.6 % (ref 11.5–15.5)
WBC: 7.8 10*3/uL (ref 4.0–10.5)

## 2016-11-24 LAB — GLUCOSE, CAPILLARY
GLUCOSE-CAPILLARY: 202 mg/dL — AB (ref 65–99)
Glucose-Capillary: 131 mg/dL — ABNORMAL HIGH (ref 65–99)
Glucose-Capillary: 170 mg/dL — ABNORMAL HIGH (ref 65–99)

## 2016-11-24 NOTE — Discharge Summary (Signed)
Henry Lindsey IOX:[id number] DOB: Nov 08, 1932 DOA: 11/15/2016  PCP: [name], MD  Admit date: 11/15/2016  Discharge date: 11/24/2016  Admitted From: Home  Disposition:  home   Recommendations for Outpatient Follow-up:   Follow up with PCP in 1-2 weeks  PCP Please obtain BMP/CBC, 2 view CXR in 1week,  (see Discharge instructions)   PCP Please follow up on the following pending results: Culture results from gallbladder fluid   Home Health: PT, RN, social worker, 8   Equipment/Devices: Environmental consultant Consultations: GI, surgery, INR Discharge Condition: Stable   CODE STATUS: Full  Diet Recommendation: Diet heart healthy/carb modified     No chief complaint on file.    Brief history of present illness from the day of admission and additional interim summary    Patient is a 83 y.o. male with history of hypertension, insulin-dependent diabetes, CAD who recently had acute cholecystitis and had a percutaneous drain placed initially, and subsequently underwent cholecystectomy at [facility] on 1/24, he presented on 1/26 with worsening abdominal pain. He was initially thought to have possible CBD obstruction, but upon further evaluation was found to have a biliary leak. He subsequently underwent ERCP with stent placement on 1/28. Hospital course has been complicated by persistent abdominal pain. See below for further details                                                                 Hospital Course    Abdominal pain due to biliary leak with a Biloma s/p recent cholecystectomy on 1/24 (At [facility]): Underwent ERCP and stent placement on 1/28. Since he continued to have abdominal pain-a repeat  CT scan of the abdomen on 1/31 showed persistent Bilioma- seenby general surgery and IR underwent 2 biliary drain  placements on 11-2016, He has shown good improvement and is now symptom-free, so far all cultures negative, he has finished his antibiotic treatment, he is symptom-free morning of 11/24/2016, case discussed with surgeon on call doctor Marcello Moores. Cleared to go home. We will follow with IR within a week after discharge. Will also follow with PCP and local surgeon and GI physician. Drain management top by nursing staff to patient and wife.  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 Continue PPI.  History of CAD: Without any anginal symptoms, will resume aspirin, continue statin and beta blocker  History of PAD: Remain stable without any acute issues. Resume cilostazol once all procedures are complete.  Hypertension: Controlled with metoprolol  Dyslipidemia: Continue statin  Malnutrition of moderate degree: Continue supplements  Insulin-dependent type 2 diabetes: Serum home regimen and follow with PCP for glycemic control and A1c monitoring.    Discharge diagnosis     Principal Problem:   Biliary obstruction Active  Problems:   Hypertension   Type 2 diabetes, uncontrolled, with peripheral circulatory disorder (HCC)   CAD (coronary artery disease)   Bile duct leak   Malnutrition of moderate degree   Elevated LFTs    Discharge instructions    Discharge Instructions    Discharge instructions    Complete by:  As directed    Flush drain with 5 mL normal saline 3 times a day, monitor output daily.   Follow with Primary MD [name], MD in 7 days   Get CBC, CMP, 2 view Chest X ray checked  by Primary MD or SNF MD in 5-7 days ( we routinely change or add medications that can affect your baseline labs and fluid status, therefore we recommend that you get the mentioned basic workup next visit with your PCP, your PCP may decide not to get them or add new tests based on their clinical decision)   Activity:  As tolerated with Full fall precautions use walker/cane & assistance as needed   Disposition Home     Diet:  Heart healthy-low carbohydrate.  For Heart failure patients - Check your Weight same time everyday, if you gain over 2 pounds, or you develop in leg swelling, experience more shortness of breath or chest pain, call your Primary MD immediately. Follow Cardiac Low Salt Diet and 1.5 lit/day fluid restriction.   On your next visit with your primary care physician please Get Medicines reviewed and adjusted.   Please request your Prim.MD to go over all Hospital Tests and Procedure/Radiological results at the follow up, please get all Hospital records sent to your Prim MD by signing hospital release before you go home.   If you experience worsening of your admission symptoms, develop shortness of breath, life threatening emergency, suicidal or homicidal thoughts you must seek medical attention immediately by calling 911 or calling your MD immediately  if symptoms less severe.  You Must read complete instructions/literature along with all the possible adverse reactions/side effects for all the Medicines you take and that have been prescribed to you. Take any new Medicines after you have completely understood and accpet all the possible adverse reactions/side effects.   Do not drive, operate heavy machinery, perform activities at heights, swimming or participation in water activities or provide baby sitting services if your were admitted for syncope or siezures until you have seen by Primary MD or a Neurologist and advised to do so again.  Do not drive when taking Pain medications.    Do not take more than prescribed Pain, Sleep and Anxiety Medications  Special Instructions: If you have smoked or chewed Tobacco  in the last 2 yrs please stop smoking, stop any regular Alcohol  and or any Recreational drug use.  Wear Seat belts while driving.   Please note  You were cared for by a  hospitalist during your hospital stay. If you have any questions about your discharge medications or the care you received while you were in the hospital after you are discharged, you can call the unit and asked to speak with the hospitalist on call if the hospitalist that took care of you is not available. Once you are discharged, your primary care physician will handle any further medical issues. Please note that NO REFILLS for any discharge medications will be authorized once you are discharged, as it is imperative that you return to your primary care physician (or establish a relationship with a primary care physician if you do not have one)  for your aftercare needs so that they can reassess your need for medications and monitor your lab values.   Increase activity slowly    Complete by:  As directed       Discharge Medications   Allergies as of 11/24/2016   No Known Allergies     Medication List    STOP taking these medications   famotidine 20 MG tablet Commonly known as:  PEPCID   feeding supplement (ENSURE ENLIVE) Liqd     TAKE these medications   amLODipine 10 MG tablet Commonly known as:  NORVASC Take 1 tablet (10 mg total) by mouth daily.   aspirin 81 MG tablet Take 1 tablet (81 mg total) by mouth daily.   atorvastatin 40 MG tablet Commonly known as:  LIPITOR TAKE 1 TABLET BY MOUTH EVERY DAY   cilostazol 100 MG tablet Commonly known as:  PLETAL TAKE 1 TABLET(100 MG) BY MOUTH TWICE DAILY   fenofibrate micronized 134 MG capsule Commonly known as:  LOFIBRA TAKE 1 CAPSULE BY MOUTH EVERY MORNING BEFORE BREAKFAST What changed:  See the new instructions.   gabapentin 400 MG capsule Commonly known as:  NEURONTIN TAKE 1 CAPSULE(400 MG) BY MOUTH THREE TIMES DAILY   glipiZIDE 10 MG tablet Commonly known as:  GLUCOTROL TAKE 1 TABLET(10 MG) BY MOUTH TWICE DAILY   glucose blood test strip Use as instructed three times daily   HYDROcodone-acetaminophen 5-325 MG  tablet Commonly known as:  NORCO/VICODIN Take 1-2 tablets by mouth every 6 (six) hours as needed for moderate pain.   INS SYRINGE/NEEDLE 1CC/28G 28G X 1/2" 1 ML Misc Commonly known as:  B-D INSULIN SYRINGE 1CC/28G USE TO ADMINISTER INSULIN DAILY   insulin lispro protamine-lispro (75-25) 100 UNIT/ML Susp injection Commonly known as:  HUMALOG MIX 75/25 Inject 10 units right before  breakfast and 6 units before dinner What changed:  how much to take  when to take this  additional instructions   JANUVIA 100 MG tablet Generic drug:  sitaGLIPtin TAKE 1 TABLET(100 MG) BY MOUTH DAILY. NOTE DOSE INCREASE   latanoprost 0.005 % ophthalmic solution Commonly known as:  XALATAN INSTILL 1 DROP INTO BOTH EYES QD   magic mouthwash Soln Take 5 mLs by mouth 3 (three) times daily as needed for mouth pain.   metFORMIN 1000 MG tablet Commonly known as:  GLUCOPHAGE TAKE 1 TABLET BY MOUTH TWICE DAILY   metoprolol succinate 100 MG 24 hr tablet Commonly known as:  TOPROL-XL Take 1 tablet (100 mg total) by mouth daily. Take with or immediately following a meal.   multivitamin with minerals Tabs tablet Take 1 tablet by mouth daily.   nitroGLYCERIN 0.4 MG SL tablet Commonly known as:  NITROSTAT Place 1 tablet (0.4 mg total) under the tongue every 5 (five) minutes as needed for chest pain. MAXIMUM 3 TABLETS   ondansetron 4 MG tablet Commonly known as:  ZOFRAN Take 1 tablet (4 mg total) by mouth every 8 (eight) hours as needed for nausea or vomiting.   ONE TOUCH ULTRA SYSTEM KIT w/Device Kit 1 kit by Does not apply route once. Use DX code O53.66   [facility] DELICA LANCETS 44I Misc Use three times daily to check blood sugar.   pantoprazole 40 MG tablet Commonly known as:  PROTONIX TAKE 1 TABLET(40 MG) BY MOUTH TWICE DAILY BEFORE A MEAL   tamsulosin 0.4 MG Caps capsule Commonly known as:  FLOMAX Take 0.4 mg by mouth daily after breakfast.   traMADol 50 MG tablet Commonly known  as:   ULTRAM Take 50 mg by mouth at bedtime.   Vitamin D3 2000 units Tabs Take 1 tablet by mouth daily.            Durable Medical Equipment        Start     Ordered   11/23/16 1136  For home use only DME 3 n 1  Once     11/23/16 1137      Follow-up Information    Tye Savoy, NP Follow up on 12/16/2016.   Specialty:  Gastroenterology Why:  9 AM appointment with GI nurse paractitioner for Dr Scarlette Shorts.  bring all your meds and insurance cards to office visit.  Contact information: [place] [address] [phone]        Olean Ree, MD Follow up.   Specialty:  Surgery Why:  Call for follow up in 1 week Contact information: [place] [address] [phone]        Long Follow up.   Specialty:  Home Health Services Why:  home health physical and occupational therapy, aide, nurse and social woker Contact information: [place] [address] [phone]        [facility] Follow up.   Why:  rolling walker and 3n1 Contact information: [place] [address] [phone]        Sandi Mariscal, MD. Schedule an appointment as soon as possible for a visit in 1 week(s).   Specialty:  Interventional Radiology Why:  drain management Contact information: [address] [place] [address] [phone]        [name], MD. Schedule an appointment as soon as possible for a visit in 1 week(s).   Specialty:  Internal Medicine Contact information: [place] [address] [phone]           Major procedures and Radiology Reports - PLEASE review detailed and final reports thoroughly  -       Dg Cholangiogram Operative  Result Date: 11/13/2016 CLINICAL DATA:  Cholecystectomy. History of percutaneous cholecystostomy tube. EXAM: INTRAOPERATIVE CHOLANGIOGRAM TECHNIQUE: Cholangiographic  images from the C-arm fluoroscopic device were submitted for interpretation post-operatively. Please see the procedural report for the amount of contrast and the fluoroscopy time utilized. COMPARISON:  Cholecystostomy tube injection 10/29/2016 FINDINGS: Opacification of the intrahepatic and extrahepatic biliary system. There is moderate dilatation of the biliary system. Question minimal drainage into the duodenum. There appears to be extravasation of contrast near the injection site. Cannot exclude sludge within the common bile duct. IMPRESSION: Dilatation of the biliary system without significant drainage into the duodenum. Question minimal filling of the duodenum. Findings are concerning for narrowing or obstruction in the distal common bile duct. Electronically Signed   By: Markus Daft M.D.   On: 11/13/2016 14:34   Nm Hepatobiliary Liver Func  Result Date: 11/17/2016 CLINICAL DATA:  83 year old male status post cholecystectomy. Evaluate for bile leak. EXAM: NUCLEAR MEDICINE HEPATOBILIARY IMAGING TECHNIQUE: Sequential images of the abdomen were obtained out to 60 minutes following intravenous administration of radiopharmaceutical. RADIOPHARMACEUTICALS:  5.15 mCi Tc-51m Choletec IV COMPARISON:  Abdominal CT dated 11/16/2016 FINDINGS: Prompt uptake and biliary excretion of activity by the liver is seen. There is accumulation of radiotracer outside of the biliary tree corresponding to the fluid collection seen on the CT within the gallbladder fossa as well as sacculation  of the radiopharmaceutical within the fluid collection along the inferior surface of the left lobe of the liver. Findings most consistent with contained bite leak or biloma. No extension of radiotracer noted inferiorly within the abdomen. IMPRESSION: Extra-biliary collection of radiopharmaceutical in the gallbladder fossa and along the inferior surface of the left lobe of the liver corresponding to the fluid collections seen on the CT and most  consistent with contained bite leak/biloma. Electronically Signed   By: Anner Crete M.D.   On: 11/17/2016 00:41   Ct Abdomen Pelvis W Contrast  Result Date: 11/20/2016 CLINICAL DATA:  Cholecystectomy 1 week prior, biloma. Persistent pain. EXAM: CT ABDOMEN AND PELVIS WITH CONTRAST TECHNIQUE: Multidetector CT imaging of the abdomen and pelvis was performed using the standard protocol following bolus administration of intravenous contrast. CONTRAST:  137m ISOVUE-300 IOPAMIDOL (ISOVUE-300) INJECTION 61% COMPARISON:  CT 11/16/2016 FINDINGS: Lower chest: Decreased bilateral pleural effusions and associated bibasilar atelectasis. Coronary artery calcifications again seen. Hepatobiliary: Biliary stent in the common bile duct with improved in near completely resolved intra and extrahepatic biliary ductal dilatation. Fluid collection in the gallbladder fossa measures 7 x 4.4 cm and contains heterogeneous density and internal foci of air. Contiguous fluid tracks adjacent to the lateral left lobe of the liver, collection currently measuring 6.6 x 4.5 cm. Tracking free fluid adjacent to the stomach in caudate lobe of the liver, similar to prior, with decreased fluid tracking in the left upper quadrant. Pancreas: No ductal dilatation or inflammation. Spleen: Calcified granuloma.  Decreased perisplenic fluid. Adrenals/Urinary Tract: No adrenal nodule. Bilateral renal cysts are unchanged. No hydronephrosis. Symmetric enhancement and excretion on delayed phase imaging. Urinary bladder is physiologically distended without wall thickening. Stomach/Bowel: Colonic diverticulosis from the splenic flexure to the sigmoid. No diverticulitis. No small bowel dilatation or obstruction. Enteric contrast reaches the colon. Normal appendix. Stomach physiologically distended. Vascular/Lymphatic: Calcified and noncalcified atheromatous plaque throughout the abdominal aorta without aneurysm. retroaortic left renal vein. No bulky  adenopathy, small retroperitoneal lymph nodes. Reproductive: Prostate is unremarkable. Other: Small soft tissue nodule in the right anterior abdominal wall may be postsurgical. Trace fluid in the right pericolic gutter, improved from prior exam. Minimal air about the falciform ligament may be secondary to recent ERCP. Free intra-abdominal air has otherwise resolved. Fat within both inguinal canals. Musculoskeletal: Again seen degenerative change throughout spine. Unchanged osseous structures. IMPRESSION: 1. Placement of biliary stent with complete resolution of biliary dilatation. 2. Biloma in the gallbladder fossa and adjacent to the left lobe of the liver, unchanged in size from prior CT. There is decreased free fluid tracking in the left upper quadrant and right pericolic gutter. 3. Resolving small bowel ileus. No evidence of obstruction. Colonic diverticulosis without acute inflammation. 4. Decreasing bilateral pleural effusions and bibasilar atelectasis. Electronically Signed   By: MJeb LeveringM.D.   On: 11/20/2016 21:26   Ct Abdomen Pelvis W Contrast  Result Date: 11/16/2016 CLINICAL DATA:  Patient with recent cholecystectomy 3 days prior. Dilated intrahepatic and extrahepatic bile ducts. Evaluate for biloma. EXAM: CT ABDOMEN AND PELVIS WITH CONTRAST TECHNIQUE: Multidetector CT imaging of the abdomen and pelvis was performed using the standard protocol following bolus administration of intravenous contrast. CONTRAST:  1036mISOVUE-300 IOPAMIDOL (ISOVUE-300) INJECTION 61% COMPARISON:  CT abdomen pelvis 10/21/2016. FINDINGS: Lower chest: Normal heart size. Coronary arterial vascular calcifications. Small layering bilateral pleural effusions. Subpleural ground-glass and consolidative opacities within the lower lobes bilaterally. Hepatobiliary: Liver is normal in size and contour. Postsurgical changes compatible with interval cholecystectomy. There  is flocculent fluid demonstrated within the gallbladder  fossa. There are a few small foci of gas within the gallbladder fossa. Additionally, there is fluid extending from the gallbladder fossa into the gastrohepatic ligament and about the liver. Additionally fluid extends within the left upper quadrant around the spleen. Mild intrahepatic and extrahepatic biliary ductal dilatation. Pancreas: Mild pancreatic atrophy. Spleen: Unremarkable. Adrenals/Urinary Tract: The adrenal glands are normal. 1.5 cm cyst superior pole right kidney, 1.5 cm cyst inferior pole right kidney and 1.9 cm cyst interpolar region left kidney. There is a 1.5 cm cyst within the superior pole left kidney. Urinary bladder is unremarkable. Stomach/Bowel: Sigmoid colonic diverticulosis. Small amount of free fluid in the pelvis. Free intraperitoneal air within the upper abdomen. Normal appendix. There a few mildly distended loops of small bowel within the central abdomen. Normal morphology of the stomach. Vascular/Lymphatic: Peripheral calcified atherosclerotic plaque. Retroaortic left renal vein. No retroperitoneal lymphadenopathy. Reproductive: Prostate unremarkable. Other: Small bilateral fat containing inguinal hernias, left-greater-than-right. Musculoskeletal: Multilevel degenerative disc disease. Grade 1 anterolisthesis L4 on L5. IMPRESSION: Postsurgical changes compatible with interval cholecystectomy. Flocculent fluid within the gallbladder fossa likely secondary to postsurgical change/ surgical material. Recommend correlation with operative history. Additionally, there is fluid extending from the cholecystectomy site into the central abdomen and left upper quadrant which is nonspecific however may be secondary to a bile leak. Recommend correlation with HIDA scan. Small amount of free intraperitoneal air within the upper abdomen likely reflective of recent postoperative state. Mild intrahepatic and extrahepatic biliary ductal dilatation. Aortic atherosclerosis. Mild distention of the small bowel  centrally likely secondary to ileus. Small layering bilateral pleural effusions with underlying ground-glass consolidative opacities favored to represent atelectasis. Infection not excluded. These results will be called to the ordering clinician or representative by the Radiologist Assistant, and communication documented in the PACS or zVision Dashboard. Electronically Signed   By: Lovey Newcomer M.D.   On: 11/16/2016 20:04   Dg Ercp Biliary & Pancreatic Ducts  Result Date: 11/17/2016 CLINICAL DATA:  Bile leak with stent placement and sphincterotomy. EXAM: ERCP TECHNIQUE: Multiple spot images obtained with the fluoroscopic device and submitted for interpretation post-procedure. FLUOROSCOPY TIME:  Fluoroscopy Time:  19 minutes and 42 seconds Number of Acquired Spot Images: 8 COMPARISON:  Abdominal CT 11/16/2016. Hepatobiliary examination 11/16/2016 FINDINGS: First set of images demonstrate cannulation and opacification of the main pancreatic duct. The biliary system was cannulated and opacified with contrast. Final image demonstrates a catheter or stent in the common bile duct. Limited evaluation for contrast extravasation. IMPRESSION: Cannulation and opacification of the biliary system for sphincterotomy and stent placement. These images were submitted for radiologic interpretation only. Please see the procedural report for the amount of contrast and the fluoroscopy time utilized. Electronically Signed   By: Markus Daft M.D.   On: 11/17/2016 14:59   Dg Abd Portable 1v  Result Date: 11/20/2016 CLINICAL DATA:  Abdominal pain and distention. EXAM: PORTABLE ABDOMEN - 1 VIEW COMPARISON:  CT 11/16/2016 FINDINGS: Biliary stent noted. Abdominal gas pattern is negative for bowel obstruction or perforation. IMPRESSION: Normal abdominal gas pattern.  Biliary stent is visible. Electronically Signed   By: Andreas Newport M.D.   On: 11/20/2016 02:18   Dg Cholangiogram  Existing Tube  Result Date: 10/29/2016 INDICATION:  Cholecystostomy tube placed on October 05, 2016 ; here for evaluation prior to cholecystectomy. EXAM: CATHETER CHOLANGIOGRAM MEDICATIONS: No antibiotics were employed ANESTHESIA/SEDATION: No conscious sedation was employed. FLUOROSCOPY TIME:  Fluoroscopy Time: 1 minutes 6 seconds (178 mGy).  COMPLICATIONS: None immediate. PROCEDURE: The cholecystostomy tube was accessed with a luer lock syringe. Under fluoroscopic guidance a total of 10 cc of a solution of Isovue 300 and sterile saline 1:3 was instilled. The gallbladder was contracted with the catheter noted be intraluminal. No leakage of contrast outside the gallbladder was observed. There is was prompt filling of the cystic duct and common bile duct. The intrahepatic ducts partially filled. The contour of the common bile duct was normal with no filling defects observed. A tiny amount of drainage into the duodenum was observed at the conclusion of the study. 10 cc of contrast and bile were aspirated into the syringe at the conclusion of the study. The patient voiced no discomfort IMPRESSION: Injection of the cholecystostomy tube with filling of a portion of the gallbladder. The visualized portions of the gallbladder exhibit no inflammatory changes. Normal appearance of the cystic duct and common bile duct with no intraductal stones observed. Electronically Signed   By: David  Martinique M.D.   On: 10/29/2016 09:36   Ct Image Guided Fluid Drain By Catheter  Result Date: 11/22/2016 INDICATION: History of cholecystectomy complicated by bile leak necessitating placement of a biliary stent. Unfortunately, the patient has developed postoperative fluid collections about the gallbladder fossa and about the anterior inferior aspect the left lobe of the liver. Request made for percutaneous drainage catheter(s) placement. EXAM: 1. CT GUIDED DRAINAGE OF GALLBLADDER FOSSA ABSCESS 2. CT-GUIDED DRAINAGE OF INDETERMINATE FLUID COLLECTION ABOUT THE ANTERIOR CAUDAL ASPECT THE LEFT  LOBE OF THE LIVER COMPARISON:  CT abdomen pelvis - 11/20/2016 MEDICATIONS: The patient is currently admitted to the hospital and receiving intravenous antibiotics. The antibiotics were administered within an appropriate time frame prior to the initiation of the procedure. ANESTHESIA/SEDATION: Moderate (conscious) sedation was employed during this procedure. A total of Versed 3 mg and Fentanyl 125 mcg was administered intravenously. Moderate Sedation Time: 30 minutes. The patient's level of consciousness and vital signs were monitored continuously by radiology nursing throughout the procedure under my direct supervision. CONTRAST:  None COMPLICATIONS: None immediate. PROCEDURE: Informed written consent was obtained from the patient and the patient's daughter after a discussion of the risks, benefits and alternatives to treatment. The patient was placed supine on the CT gantry and a pre procedural CT was performed re-demonstrating the known abscess/fluid collection within the gallbladder fossa with dominant component measuring approximately 8.0 x 4.4 cm (image 45, series 2 an additional component about the left lobe of the liver measuring approximately 5.6 x 4.3 cm (image 41, series 2). The procedure was planned. A timeout was performed prior to the initiation of the procedure. The skin overlying the right upper abdominal quadrant was prepped and draped in the usual sterile fashion. The overlying soft tissues were anesthetized with 1% lidocaine with epinephrine. Attention was initially paid towards placement of the gallbladder fossa abscess drainage catheter. Appropriate trajectory was planned with the use of a 22 gauge spinal needle. An 18 gauge trocar needle was advanced into the abscess/fluid collection and a short Amplatz super stiff wire was coiled within the collection. Appropriate positioning was confirmed with a limited CT scan. The tract was serially dilated allowing placement of a 10 [place] all-purpose  drainage catheter. Appropriate positioning was confirmed with a limited postprocedural CT scan. Approximately 50 Ml of blood tinged bilious fluid was aspirated. The tube was connected to a JP bulb and sutured in place. Following placement of the initial percutaneous drainage catheter, CT imaging demonstrated a persistent collection about the anterior inferior  aspect the left lobe of the liver no sustained additional percutaneous drainage catheter placement. As such, appropriate trajectory was planned with the use of a 22 gauge spinal needle. An 18 gauge trocar needle was advanced into the abscess/fluid collection and a short Amplatz super stiff wire was coiled within the collection. Appropriate positioning was confirmed with a limited CT scan. The tract was serially dilated allowing placement of a 10 [place] all-purpose drainage catheter. Appropriate positioning was confirmed with a limited postprocedural CT scan. Approximately 10 Ml of serous fluid was aspirated. The tube was connected to a JP bulb and sutured in place. A dressing was placed. The patient tolerated the procedure well without immediate post procedural complication. IMPRESSION: 1. Successful CT guided placement of a 10 [place] all purpose drain catheter into the gallbladder fossa abscess with aspiration of 50 mL of blood tinged bilious fluid. 2. Successful CT guided placement of a 10 [place] all purpose drain catheter into the indeterminate fluid collection about the anterior inferior aspect the left lobe of the liver with aspiration of 10 mL of serous fluid. 3. Samples were sent separately to the laboratory as requested by the ordering clinical team. Electronically Signed   By: Sandi Mariscal M.D.   On: 11/22/2016 10:58   Ct Image Guided Drainage By Percutaneous Catheter  Result Date: 11/22/2016 INDICATION: History of cholecystectomy complicated by bile leak necessitating placement of a biliary stent. Unfortunately, the patient has developed  postoperative fluid collections about the gallbladder fossa and about the anterior inferior aspect the left lobe of the liver. Request made for percutaneous drainage catheter(s) placement. EXAM: 1. CT GUIDED DRAINAGE OF GALLBLADDER FOSSA ABSCESS 2. CT-GUIDED DRAINAGE OF INDETERMINATE FLUID COLLECTION ABOUT THE ANTERIOR CAUDAL ASPECT THE LEFT LOBE OF THE LIVER COMPARISON:  CT abdomen pelvis - 11/20/2016 MEDICATIONS: The patient is currently admitted to the hospital and receiving intravenous antibiotics. The antibiotics were administered within an appropriate time frame prior to the initiation of the procedure. ANESTHESIA/SEDATION: Moderate (conscious) sedation was employed during this procedure. A total of Versed 3 mg and Fentanyl 125 mcg was administered intravenously. Moderate Sedation Time: 30 minutes. The patient's level of consciousness and vital signs were monitored continuously by radiology nursing throughout the procedure under my direct supervision. CONTRAST:  None COMPLICATIONS: None immediate. PROCEDURE: Informed written consent was obtained from the patient and the patient's daughter after a discussion of the risks, benefits and alternatives to treatment. The patient was placed supine on the CT gantry and a pre procedural CT was performed re-demonstrating the known abscess/fluid collection within the gallbladder fossa with dominant component measuring approximately 8.0 x 4.4 cm (image 45, series 2 an additional component about the left lobe of the liver measuring approximately 5.6 x 4.3 cm (image 41, series 2). The procedure was planned. A timeout was performed prior to the initiation of the procedure. The skin overlying the right upper abdominal quadrant was prepped and draped in the usual sterile fashion. The overlying soft tissues were anesthetized with 1% lidocaine with epinephrine. Attention was initially paid towards placement of the gallbladder fossa abscess drainage catheter. Appropriate  trajectory was planned with the use of a 22 gauge spinal needle. An 18 gauge trocar needle was advanced into the abscess/fluid collection and a short Amplatz super stiff wire was coiled within the collection. Appropriate positioning was confirmed with a limited CT scan. The tract was serially dilated allowing placement of a 10 [place] all-purpose drainage catheter. Appropriate positioning was confirmed with a limited postprocedural CT scan. Approximately  50 Ml of blood tinged bilious fluid was aspirated. The tube was connected to a JP bulb and sutured in place. Following placement of the initial percutaneous drainage catheter, CT imaging demonstrated a persistent collection about the anterior inferior aspect the left lobe of the liver no sustained additional percutaneous drainage catheter placement. As such, appropriate trajectory was planned with the use of a 22 gauge spinal needle. An 18 gauge trocar needle was advanced into the abscess/fluid collection and a short Amplatz super stiff wire was coiled within the collection. Appropriate positioning was confirmed with a limited CT scan. The tract was serially dilated allowing placement of a 10 [place] all-purpose drainage catheter. Appropriate positioning was confirmed with a limited postprocedural CT scan. Approximately 10 Ml of serous fluid was aspirated. The tube was connected to a JP bulb and sutured in place. A dressing was placed. The patient tolerated the procedure well without immediate post procedural complication. IMPRESSION: 1. Successful CT guided placement of a 10 [place] all purpose drain catheter into the gallbladder fossa abscess with aspiration of 50 mL of blood tinged bilious fluid. 2. Successful CT guided placement of a 10 French all purpose drain catheter into the indeterminate fluid collection about the anterior inferior aspect the left lobe of the liver with aspiration of 10 mL of serous fluid. 3. Samples were sent separately to the laboratory as  requested by the ordering clinical team. Electronically Signed   By: Sandi Mariscal M.D.   On: 11/22/2016 10:58   US Abdomen Limited Ruq  Result Date: 11/15/2016 CLINICAL DATA:  Abdominal pain EXAM: US ABDOMEN LIMITED - RIGHT UPPER QUADRANT COMPARISON:  CT 1 month 1,016 FINDINGS: Gallbladder: Prior cholecystectomy Common bile duct: Diameter: Dilated, 12 mm. Dilated duct as seen on intraoperative cholangiogram. History new since prior CT. Liver: Intrahepatic biliary ductal dilatation. Increased echotexture throughout the liver. IMPRESSION: Prior cholecystectomy. Dilated intrahepatic and extrahepatic biliary ducts. Common bile duct dilated to 12 mm. Cannot exclude distal obstructing duct stone. This could be further evaluated with ERCP or MRCP. Fatty liver. Electronically Signed   By: Rolm Baptise M.D.   On: 11/15/2016 12:23    Micro Results      Recent Results (from the past 240 hour(s))  Aerobic/Anaerobic Culture (surgical/deep wound)     Status: None (Preliminary result)   Collection Time: 11/22/16 12:29 PM  Result Value Ref Range Status   Specimen Description ABSCESS GALL BLADDER  Final   Special Requests GB FOSSA ABSCESS LATERAL  Final   Gram Stain   Final    RARE WBC PRESENT, PREDOMINANTLY PMN NO ORGANISMS SEEN    Culture CULTURE REINCUBATED FOR BETTER GROWTH  Final   Report Status PENDING  Incomplete  Aerobic/Anaerobic Culture (surgical/deep wound)     Status: None (Preliminary result)   Collection Time: 11/22/16 12:31 PM  Result Value Ref Range Status   Specimen Description ABSCESS LIVER  Final   Special Requests ANTERIOR INFERIOR LEFT LOBE OF LIVER  Final   Gram Stain   Final    ABUNDANT WBC PRESENT, PREDOMINANTLY PMN RARE YEAST    Culture CULTURE REINCUBATED FOR BETTER GROWTH  Final   Report Status PENDING  Incomplete    Today   Subjective    Henry Lindsey today has no headache,no chest abdominal pain,no new weakness tingling or numbness, feels much better wants to go home  today.     Objective   Blood pressure (!) 140/50, pulse 75, temperature 99 F (37.2 C), temperature source Oral, resp. rate 18,  height '5\' 11"'  (1.803 m), weight 80.7 kg (177 lb 14.6 oz), SpO2 98 %.   Intake/Output Summary (Last 24 hours) at 11/24/16 1128 Last data filed at 11/24/16 0742  Gross per 24 hour  Intake              995 ml  Output              822 ml  Net              173 ml    Exam Awake Alert, Oriented x 3, No new F.N deficits, Normal affect [place].AT,PERRAL Supple Neck,No JVD, No cervical lymphadenopathy appriciated.  Symmetrical Chest wall movement, Good air movement bilaterally, CTAB RRR,No Gallops,Rubs or new Murmurs, No Parasternal Heave +ve B.Sounds, Abd Soft, Non tender, GB drains stable, No organomegaly appriciated, No rebound -guarding or rigidity. No Cyanosis, Clubbing or edema, No new Rash or bruise   Data Review   CBC w Diff: Lab Results  Component Value Date   WBC 7.8 11/24/2016   HGB 9.8 (L) 11/24/2016   HGB 9.5 (L) 08/29/2012   HCT 32.2 (L) 11/24/2016   HCT 42.1 08/17/2012   PLT 355 11/24/2016   PLT 321 08/17/2012   LYMPHOPCT 24.2 10/31/2016   MONOPCT 7.9 10/31/2016   EOSPCT 5.1 (H) 10/31/2016   BASOPCT 0.4 10/31/2016    CMP: Lab Results  Component Value Date   NA 137 11/23/2016   NA 136 08/27/2012   K 3.7 11/23/2016   K 4.6 08/27/2012   CL 101 11/23/2016   CL 103 08/27/2012   CO2 29 11/23/2016   CO2 27 08/27/2012   BUN 14 11/23/2016   BUN 20 (H) 08/27/2012   CREATININE 1.08 11/23/2016   CREATININE 1.07 08/27/2012   CREATININE 1.19 04/07/2012   PROT 5.7 (L) 11/23/2016   PROT 7.0 03/29/2012   ALBUMIN 2.0 (L) 11/23/2016   ALBUMIN 3.5 03/29/2012   BILITOT 0.6 11/23/2016   BILITOT 0.7 03/29/2012   ALKPHOS 186 (H) 11/23/2016   ALKPHOS 74 03/29/2012   AST 31 11/23/2016   AST 18 03/29/2012   ALT 34 11/23/2016   ALT 25 03/29/2012  .   Total Time in preparing paper work, data evaluation and todays exam - 35 minutes  Thurnell Lose M.D on 11/24/2016 at 11:28 AM  [facility]   Office  [phone]

## 2016-11-24 NOTE — Progress Notes (Signed)
Discharge instructions gone over with patient and son. Home medications discussed. Follow up appointment to be made. Diet, activity and worsening conditions discussed. Reasons to call 911 discussed. Patient will be followed by [facility]. Patient has rolling walker and 3N1 chair. Patient's son was taught how to empty drains, flush drains, and change dressing on drain sites. He demonstrated to me at the bedside prior to discharging the patient exactly how to empty and flush drains. He used good technique. A drain care sheet was also provided to the patient. Flushes and dressing care were also sent home with patient. Patient and son verbalized understanding of instructions.

## 2016-11-24 NOTE — Discharge Instructions (Signed)
Flush drain with 5 mL normal saline 3 times a day, monitor output daily.   Follow with Primary MD [name], MD in 7 days   Get CBC, CMP, 2 view Chest X ray checked  by Primary MD or SNF MD in 5-7 days ( we routinely change or add medications that can affect your baseline labs and fluid status, therefore we recommend that you get the mentioned basic workup next visit with your PCP, your PCP may decide not to get them or add new tests based on their clinical decision)   Activity: As tolerated with Full fall precautions use walker/cane & assistance as needed   Disposition Home     Diet:  Heart healthy-low carbohydrate.  For Heart failure patients - Check your Weight same time everyday, if you gain over 2 pounds, or you develop in leg swelling, experience more shortness of breath or chest pain, call your Primary MD immediately. Follow Cardiac Low Salt Diet and 1.5 lit/day fluid restriction.   On your next visit with your primary care physician please Get Medicines reviewed and adjusted.   Please request your Prim.MD to go over all Hospital Tests and Procedure/Radiological results at the follow up, please get all Hospital records sent to your Prim MD by signing hospital release before you go home.   If you experience worsening of your admission symptoms, develop shortness of breath, life threatening emergency, suicidal or homicidal thoughts you must seek medical attention immediately by calling 911 or calling your MD immediately  if symptoms less severe.  You Must read complete instructions/literature along with all the possible adverse reactions/side effects for all the Medicines you take and that have been prescribed to you. Take any new Medicines after you have completely understood and accpet all the possible adverse reactions/side effects.   Do not drive, operate heavy machinery, perform activities at heights, swimming or participation in water activities or provide baby sitting  services if your were admitted for syncope or siezures until you have seen by Primary MD or a Neurologist and advised to do so again.  Do not drive when taking Pain medications.    Do not take more than prescribed Pain, Sleep and Anxiety Medications  Special Instructions: If you have smoked or chewed Tobacco  in the last 2 yrs please stop smoking, stop any regular Alcohol  and or any Recreational drug use.  Wear Seat belts while driving.   Please note  You were cared for by a hospitalist during your hospital stay. If you have any questions about your discharge medications or the care you received while you were in the hospital after you are discharged, you can call the unit and asked to speak with the hospitalist on call if the hospitalist that took care of you is not available. Once you are discharged, your primary care physician will handle any further medical issues. Please note that NO REFILLS for any discharge medications will be authorized once you are discharged, as it is imperative that you return to your primary care physician (or establish a relationship with a primary care physician if you do not have one) for your aftercare needs so that they can reassess your need for medications and monitor your lab values.       Percutaneous Drain, Care After Refer to this sheet in the next few weeks. These instructions provide you with information on caring for yourself after your procedure. Your health care provider may also give you more specific instructions. Your treatment  has been planned according to current medical practices, but problems sometimes occur. Call your health care provider if you have any problems or questions after your procedure. WHAT TO EXPECT AFTER THE PROCEDURE After your procedure, it is typical to have the following:   A small amount of discomfort in the area where the drainage tube was placed.  A small amount of bruising around the area where the drainage tube  was placed.  Sleepiness and fatigue for the rest of the day from the medicines used. HOME CARE INSTRUCTIONS  Rest at home for 1-2 days following your procedure or as directed by your health care provider.  If you go home right after the procedure, plan to have someone with you for 24 hours.  Do not take a bathor shower for 24 hours after your procedure.  Take medicines only as directed by your health care provider. Ask your health care provider when you can resume taking any normal medicines.  Change bandages (dressings) as directed.   You may be told to record the amount of drainage from the bag every time you empty it. Follow your health care provider's directions for emptying the bag. Write down the amount of drainage, the date, and the time you emptied it.  Call your health care provider when the drain is putting out less than 10 mL of drainage per day for 2-3 days in a row or as directed by your health care provider.  Follow your health care provider's instructions for cleaning the drainage tube. You may need to clean the tube every day so that it does not clog. SEEK MEDICAL CARE IF:  You have increased bleeding (more than a small spot) from the site where the drainage tube was placed.  You have redness, swelling, or increasing pain around the site where the drainage tube was placed.  You notice a discharge or bad smell coming from the site where the drainage tube was placed.  You have a fever or chills.  You have pain that is not helped by medicine.  SEEK IMMEDIATE MEDICAL CARE IF:  There is leakage around the drainage tube.  The drainage tube pulls out.  You suddenly stop having drainage from the tube.  You suddenly have blood in the drainage fluid.  You become dizzy or faint.  You develop a rash.   You have nausea or vomiting.  You have difficulty breathing, feel short of breath, or feel faint.   You develop chest pain.  You have problems with your  speech or vision.  You have trouble balancing or moving your arms or legs. This information is not intended to replace advice given to you by your health care provider. Make sure you discuss any questions you have with your health care provider. Document Released: 02/21/2014 Document Revised: 07/26/2014 Document Reviewed: 02/21/2014 Elsevier Interactive Patient Education  2017 [employer].

## 2016-11-25 LAB — GLUCOSE, CAPILLARY: Glucose-Capillary: 135 mg/dL — ABNORMAL HIGH (ref 65–99)

## 2016-11-26 ENCOUNTER — Telehealth: Payer: Self-pay

## 2016-11-26 NOTE — Telephone Encounter (Signed)
Discharged 11/24/16, Dx Choledocholithiasis HFU scheduled with GI 12/16/16 PCP to follow up within 2 weeks re: culture results from gallbladder fluid Next scheduled appt with you is 01/02/17-is there a place we can schedule him sooner for follow up?

## 2016-11-26 NOTE — Telephone Encounter (Signed)
Will follow with transitional care management as appropriate. Elective surgery.

## 2016-11-26 NOTE — Telephone Encounter (Signed)
4:30 next Wednesday feb 14

## 2016-11-27 ENCOUNTER — Telehealth: Payer: Self-pay | Admitting: Internal Medicine

## 2016-11-27 ENCOUNTER — Encounter: Payer: Self-pay | Admitting: Surgery

## 2016-11-27 ENCOUNTER — Telehealth: Payer: Self-pay

## 2016-11-27 ENCOUNTER — Other Ambulatory Visit: Payer: Self-pay | Admitting: Internal Medicine

## 2016-11-27 ENCOUNTER — Ambulatory Visit (INDEPENDENT_AMBULATORY_CARE_PROVIDER_SITE_OTHER): Payer: Medicare Other | Admitting: Surgery

## 2016-11-27 ENCOUNTER — Other Ambulatory Visit
Admission: RE | Admit: 2016-11-27 | Discharge: 2016-11-27 | Disposition: A | Discharge status: Home / Self Care | Payer: Medicare Other | Source: Ambulatory Visit | Attending: Surgery | Admitting: Surgery

## 2016-11-27 VITALS — BP 126/75 | HR 102 | Temp 98.2°F | Ht 71.0 in | Wt 169.0 lb

## 2016-11-27 DIAGNOSIS — K8050 Calculus of bile duct without cholangitis or cholecystitis without obstruction: Secondary | ICD-10-CM

## 2016-11-27 DIAGNOSIS — R7989 Other specified abnormal findings of blood chemistry: Secondary | ICD-10-CM

## 2016-11-27 DIAGNOSIS — R945 Abnormal results of liver function studies: Secondary | ICD-10-CM

## 2016-11-27 DIAGNOSIS — J90 Pleural effusion, not elsewhere classified: Secondary | ICD-10-CM

## 2016-11-27 DIAGNOSIS — A419 Sepsis, unspecified organism: Secondary | ICD-10-CM

## 2016-11-27 LAB — CBC WITH DIFFERENTIAL/PLATELET
BASOS ABS: 0.1 10*3/uL (ref 0–0.1)
Basophils Relative: 1 %
Eosinophils Absolute: 0.6 10*3/uL (ref 0–0.7)
Eosinophils Relative: 5 %
HEMATOCRIT: 35.9 % — AB (ref 40.0–52.0)
Hemoglobin: 11.6 g/dL — ABNORMAL LOW (ref 13.0–18.0)
LYMPHS ABS: 1.7 10*3/uL (ref 1.0–3.6)
LYMPHS PCT: 15 %
MCH: 26.5 pg (ref 26.0–34.0)
MCHC: 32.2 g/dL (ref 32.0–36.0)
MCV: 82.1 fL (ref 80.0–100.0)
MONO ABS: 0.8 10*3/uL (ref 0.2–1.0)
Monocytes Relative: 7 %
NEUTROS ABS: 8 10*3/uL — AB (ref 1.4–6.5)
Neutrophils Relative %: 72 %
Platelets: 641 10*3/uL — ABNORMAL HIGH (ref 150–440)
RBC: 4.37 MIL/uL — AB (ref 4.40–5.90)
RDW: 15.2 % — ABNORMAL HIGH (ref 11.5–14.5)
WBC: 11.1 10*3/uL — AB (ref 3.8–10.6)

## 2016-11-27 LAB — COMPREHENSIVE METABOLIC PANEL
ALK PHOS: 180 U/L — AB (ref 38–126)
ALT: 40 U/L (ref 17–63)
AST: 39 U/L (ref 15–41)
Albumin: 3.1 g/dL — ABNORMAL LOW (ref 3.5–5.0)
Anion gap: 12 (ref 5–15)
BILIRUBIN TOTAL: 0.9 mg/dL (ref 0.3–1.2)
BUN: 13 mg/dL (ref 6–20)
CALCIUM: 9 mg/dL (ref 8.9–10.3)
CO2: 25 mmol/L (ref 22–32)
CREATININE: 1.31 mg/dL — AB (ref 0.61–1.24)
Chloride: 101 mmol/L (ref 101–111)
GFR calc Af Amer: 57 mL/min — ABNORMAL LOW (ref >60)
GFR calc non Af Amer: 49 mL/min — ABNORMAL LOW (ref >60)
Glucose, Bld: 163 mg/dL — ABNORMAL HIGH (ref 65–99)
Potassium: 4.1 mmol/L (ref 3.5–5.1)
Sodium: 138 mmol/L (ref 135–145)
TOTAL PROTEIN: 7.4 g/dL (ref 6.5–8.1)

## 2016-11-27 LAB — AEROBIC/ANAEROBIC CULTURE W GRAM STAIN (SURGICAL/DEEP WOUND)

## 2016-11-27 LAB — AEROBIC/ANAEROBIC CULTURE (SURGICAL/DEEP WOUND)

## 2016-11-27 NOTE — Telephone Encounter (Signed)
Transition Care Management Follow-up Telephone Call   Date discharged? 11/24/16   How have you been since you were released from the hospital? No chest pain.  No SOB. Resting well.  No N/V/D.  Tired but regaining strength.   Do you understand why you were in the hospital? YES, chest pain and gall bladder surgery.   Do you understand the discharge instructions? YES, increase activity as tolerated with full fall precautions. Follow up with PCP for glycemic control/A1C monitoring, culture results from gall bladder fluid.  Flush drain 3 times a day, monitor output daily.   Where were you discharged to? Home.   Items Reviewed:  Medications reviewed: Refused review of medications via phone.  Stated he had assistance with review at the hospital, as well as with a nurse in the home and will bring all medications to upcoming visit with PCP.  Allergies reviewed: YES, no known allergies.  Dietary changes reviewed: YES, heart healthy-low carbohydrate.  Referrals reviewed: YES, appointment scheduled with PCP and GI.  HHPT/HH in progress.   Functional Questionnaire:   Activities of Daily Living (ADLs):   He states they are independent in the following: Independent in all ADLS. States they require assistance with the following: Does not require assistance at this time.   Any transportation issues/concerns?: NO issues at this time.   Any patient concerns? NO concerns at this time.   Confirmed importance and date/time of follow-up visits scheduled YES, appointment tentively scheduled on 12/04/16 at 4:30.    Provider Appointment booked with Dr. Derrel Nip  Confirmed with patient if condition begins to worsen call PCP or go to the ER.  Patient was given the office number and encouraged to call back with question or concerns.  : Patient verbalized understanding.

## 2016-11-27 NOTE — Patient Instructions (Addendum)
Please have your labs done today at the [facility] at the hospital. I will call you with the results.   Directions to Medical Mall: When leaving our office, go right. Go all of the way down to the very end of the hallway. You will have a purple wall in front of you. You will now have a tunnel to the hospital on your left hand side. Go through this tunnel and the elevators will be on your left. Go down to the 1st floor and take a slight left. The very first desk on the right hand side is the registration desk.  We will have you see Dr. Allen Norris (Gastroenterologist) in regards to your Stent that you have in your Bile Duct. You will see him as scheduled below.  Keep a dressing over the area where the drain has been pulled today until there is no drainage on the dressing any longer, you may then go without a dressing. This may take 3-4 days.  Dr. Hampton Abbot will see you next week as scheduled below.  Please call with any questions or concerns that you may have prior to your appointment.

## 2016-11-27 NOTE — Telephone Encounter (Signed)
Ok to give verbal order for home health

## 2016-11-27 NOTE — Progress Notes (Signed)
Outpatient postop visit  11/27/2016  Henry Lindsey is an 83 y.o. male.    Procedure: LC/grams, ERCP/stent, perc drains  [id number] in place  HPI: Patient has no complaints at this time he states he is weak but is getting better he's eating better having no fevers or chills history is reviewed in the chart.  Medications reviewed.    Physical Exam:  BP 126/75   Pulse (!) 102   Temp 98.2 F (36.8 C) (Oral)   Ht 5\' 11"  (1.803 m)   Wt 169 lb (76.7 kg)   BMI 23.57 kg/m     PE: No icterus no jaundice no acute distress.  There is a bile tinge to the thin fluid in the right drain which has a higher volume. The epigastric drain is serosanguineous only without any bile tinge and has minimal output. Wounds are clean with minimal ecchymosis.    Assessment/Plan:  Patient doing very well at this time however we have no labs to review which I would like to order today. I also think that he may have some bile tinge to the right-sided drain and it will be left in place. It has a higher volume output but that may be due to gravity dependence. At this point I see no clear evidence of a bile leak and this could be residual staining from his biloma. He is doing quite well clinically. I removed the epigastric drain which was serosanguineous and will have him come back next week for drain reevaluation. He does not want to go to [facility] for GI follow-up therefore we'll ask him to see Dr. Allen Norris for stent removal at some point.  Florene Glen, MD, FACS

## 2016-11-27 NOTE — Telephone Encounter (Signed)
Merry Proud [phone] called from [facility] regarding needing verbal order for home health nurse for disease management. PT to evaluate and treat. Medical social worker for community resources. Pt declined the home health aid. Thank you!

## 2016-11-28 ENCOUNTER — Telehealth: Payer: Self-pay

## 2016-11-28 NOTE — Telephone Encounter (Signed)
Merry Proud [facility] advised of orders as stated below.

## 2016-11-28 NOTE — Telephone Encounter (Signed)
Reviewed labwork with Dr. Burt Knack. No new orders at this time.   Call made to patient. Labs reviewed. I explained that we will continue with plan as scheduled. Patient verbalizes understanding and agrees with plan.

## 2016-11-29 ENCOUNTER — Ambulatory Visit (INDEPENDENT_AMBULATORY_CARE_PROVIDER_SITE_OTHER): Payer: Medicare Other

## 2016-11-29 DIAGNOSIS — J90 Pleural effusion, not elsewhere classified: Secondary | ICD-10-CM | POA: Diagnosis not present

## 2016-12-02 ENCOUNTER — Ambulatory Visit (INDEPENDENT_AMBULATORY_CARE_PROVIDER_SITE_OTHER): Payer: Medicare Other | Admitting: Gastroenterology

## 2016-12-02 ENCOUNTER — Other Ambulatory Visit: Payer: Self-pay

## 2016-12-02 DIAGNOSIS — K838 Other specified diseases of biliary tract: Secondary | ICD-10-CM | POA: Diagnosis not present

## 2016-12-02 NOTE — Progress Notes (Signed)
Gastroenterology Consultation  Referring Provider:     [name], MD Primary Care Physician:  [name], MD Primary Gastroenterologist:  Dr. Allen Norris     Reason for Consultation:     Bile duct leak        HPI:   Henry Lindsey is a 83 y.o. y/o male referred for consultation & management of Bile duct leak by Dr. [name], MD.  This patient comes in today after having a cholecystectomy with a bile duct leak.  The patient had been admitted to the hospital on the weekend and we did not have ERCP coverage of the patient was sent out to [facility] where he had an ERCP.  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  The stricture was transversed and a biliary stent was placed.  The patient also had a percutaneous drain placed by radiology to drain the gallbladder fossa and collection of bile around the liver.  The patient now states that there is very little drainage coming from his percutaneous drainage bags.  He denies any jaundice or dark stools dark urine or abdominal pain.  Past Medical History:  Diagnosis Date  . BPH (benign prostatic hyperplasia)   . Cancer ([place])   . Carotid artery stenosis 11/2010   <50% bilaterally  . CVA (cerebral infarction) 11/2010   r thalamic lacunar  . Diabetes mellitus   . GERD (gastroesophageal reflux disease)   . Hyperlipidemia   . Hypertension   . Neuromuscular disorder ([place])   . Peripheral vascular disease in diabetes mellitus ([place]) 06/2012    95% occlusion s/p PTCA R SFA Dew Sept 2013    Past Surgical History:  Procedure Laterality Date  . CHOLECYSTECTOMY N/A 11/13/2016   Procedure: LAPAROSCOPIC CHOLECYSTECTOMY WITH INTRAOPERATIVE CHOLANGIOGRAM;  Surgeon: Olean Ree, MD;  Location: [facility] ORS;  Service: General;  Laterality: N/A;  . ERCP N/A 11/17/2016   Procedure: ENDOSCOPIC RETROGRADE CHOLANGIOPANCREATOGRAPHY (ERCP);  Surgeon: Irene Shipper, MD;  Location: [facility] ENDOSCOPY;  Service:  Endoscopy;  Laterality: N/A;  . IR GENERIC HISTORICAL  10/06/2016   IR PERC CHOLECYSTOSTOMY 10/06/2016 Aletta Edouard, MD [facility]-INTERV RAD  . JOINT REPLACEMENT Left 2014   left knee  . UPPER GASTROINTESTINAL ENDOSCOPY    . WRIST SURGERY      Prior to Admission medications   Medication Sig Start Date End Date Taking? Authorizing Provider  Alum & Mag Hydroxide-Simeth (MAGIC MOUTHWASH) SOLN Take 5 mLs by mouth 3 (three) times daily as needed for mouth pain.    Historical Provider, MD  amLODipine (NORVASC) 10 MG tablet Take 1 tablet (10 mg total) by mouth daily. 11/11/16   Minna Merritts, MD  aspirin 81 MG tablet Take 1 tablet (81 mg total) by mouth daily. 11/05/16   Minna Merritts, MD  atorvastatin (LIPITOR) 40 MG tablet TAKE 1 TABLET BY MOUTH EVERY DAY 07/15/16   [name], MD  Blood Glucose Monitoring Suppl (ONE TOUCH ULTRA SYSTEM KIT) w/Device KIT 1 kit by Does not apply route once. Use DX code E11.59 10/18/15   [name], MD  Cholecalciferol (VITAMIN D3) 2000 units TABS Take 1 tablet by mouth daily.    Historical Provider, MD  cilostazol (PLETAL) 100 MG tablet TAKE 1 TABLET(100 MG) BY MOUTH TWICE DAILY 08/13/16   [name], MD  fenofibrate micronized (LOFIBRA) 134 MG capsule TAKE 1 CAPSULE BY MOUTH EVERY MORNING BEFORE BREAKFAST  Patient taking differently: TAKE 1 CAPSULE tid 08/28/16   [name], MD  gabapentin (NEURONTIN) 400 MG capsule TAKE 1 CAPSULE(400 MG) BY MOUTH THREE TIMES DAILY 09/09/16   [name], MD  glipiZIDE (GLUCOTROL) 10 MG tablet TAKE 1 TABLET(10 MG) BY MOUTH TWICE DAILY 03/27/16   [name], MD  glucose blood test strip Use as instructed three times daily 05/03/16   [name], MD  HYDROcodone-acetaminophen (NORCO/VICODIN) 5-325 MG tablet Take 1-2 tablets by mouth every 6 (six) hours as needed for moderate pain. 11/13/16   Olean Ree, MD  INS SYRINGE/NEEDLE 1CC/28G (B-D INSULIN SYRINGE 1CC/28G) 28G X 1/2" 1 ML MISC USE TO ADMINISTER INSULIN  DAILY 02/13/16   [name], MD  insulin lispro protamine-lispro (HUMALOG MIX 75/25) (75-25) 100 UNIT/ML SUSP injection Inject 10 units right before  breakfast and 6 units before dinner Patient taking differently: 6-8 Units 2 (two) times daily. Inject 6 qam and 8 qpm 05/03/16   [name], MD  JANUVIA 100 MG tablet TAKE 1 TABLET(100 MG) BY MOUTH DAILY. NOTE DOSE INCREASE 08/13/16   [name], MD  latanoprost (XALATAN) 0.005 % ophthalmic solution INSTILL 1 DROP INTO BOTH EYES QD 11/01/15   Historical Provider, MD  metFORMIN (GLUCOPHAGE) 1000 MG tablet TAKE 1 TABLET BY MOUTH TWICE DAILY 07/26/16   [name], MD  metoprolol succinate (TOPROL-XL) 100 MG 24 hr tablet Take 1 tablet (100 mg total) by mouth daily. Take with or immediately following a meal. 11/11/16   Minna Merritts, MD  Multiple Vitamin (MULTIVITAMIN WITH MINERALS) TABS tablet Take 1 tablet by mouth daily.    Historical Provider, MD  nitroGLYCERIN (NITROSTAT) 0.4 MG SL tablet Place 1 tablet (0.4 mg total) under the tongue every 5 (five) minutes as needed for chest pain. MAXIMUM 3 TABLETS 10/04/16   [name], MD  ondansetron (ZOFRAN) 4 MG tablet Take 1 tablet (4 mg total) by mouth every 8 (eight) hours as needed for nausea or vomiting. 09/03/16   [name], MD  [facility] DELICA LANCETS 69G MISC Use three times daily to check blood sugar. 10/18/15   [name], MD  pantoprazole (PROTONIX) 40 MG tablet TAKE 1 TABLET(40 MG) BY MOUTH TWICE DAILY BEFORE A MEAL 11/08/16   [name], MD  tamsulosin (FLOMAX) 0.4 MG CAPS capsule Take 0.4 mg by mouth daily after breakfast.    Historical Provider, MD  traMADol (ULTRAM) 50 MG tablet Take 50 mg by mouth at bedtime.  11/24/13   Historical Provider, MD    Family History  Problem Relation Age of Onset  . Diabetes Mother      Social History  Substance Use Topics  . Smoking status: Former Smoker    Packs/day: 0.50    Years: 33.00    Types: Cigarettes    Start date:  10/01/1935    Quit date: 04/21/1968  . Smokeless tobacco: Never Used  . Alcohol use No    Allergies as of 12/02/2016  . (No Known Allergies)    Review of Systems:    All systems reviewed and negative except where noted in HPI.   Physical Exam:  There were no vitals taken for this visit. No LMP for male patient. Psych:  Alert and cooperative. Normal mood and affect. General:   Alert,  Well-developed, well-nourished, pleasant and cooperative in NAD Head:  Normocephalic and atraumatic. Eyes:  Sclera clear, no icterus.   Conjunctiva pink. Ears:  Normal auditory  acuity. Nose:  No deformity, discharge, or lesions. Mouth:  No deformity or lesions,oropharynx pink & moist. Neck:  Supple; no masses or thyromegaly. Lungs:  Respirations even and unlabored.  Clear throughout to auscultation.   No wheezes, crackles, or rhonchi. No acute distress. Heart:  Regular rate and rhythm; no murmurs, clicks, rubs, or gallops. Abdomen:  Normal bowel sounds.  No bruits.  Soft, non-tender and non-distended without masses, hepatosplenomegaly or hernias noted.  No guarding or rebound tenderness.  Negative Carnett sign.  A percutaneous drain was seen on the right side Rectal:  Deferred.  Msk:  Symmetrical without gross deformities.  Good, equal movement & strength bilaterally. Pulses:  Normal pulses noted. Extremities:  No clubbing or edema.  No cyanosis. Neurologic:  Alert and oriented x3;  grossly normal neurologically. Skin:  Intact without significant lesions or rashes.  No jaundice. Lymph Nodes:  No significant cervical adenopathy. Psych:  Alert and cooperative. Normal mood and affect.  Imaging Studies: Dg Chest 2 View  Result Date: 11/29/2016 CLINICAL DATA:  Follow-up bilateral pleural effusions EXAM: CHEST  2 VIEW COMPARISON:  10/06/2016, 11/22/2016 FINDINGS: Cardiac shadow is within normal limits. The lungs are well aerated bilaterally. No focal infiltrate or sizable effusion is seen. Right upper  quadrant drain is again noted. IMPRESSION: No acute abnormality noted. Electronically Signed   By: [name] M.D.   On: 11/29/2016 14:17   Dg Cholangiogram Operative  Result Date: 11/13/2016 CLINICAL DATA:  Cholecystectomy. History of percutaneous cholecystostomy tube. EXAM: INTRAOPERATIVE CHOLANGIOGRAM TECHNIQUE: Cholangiographic images from the C-arm fluoroscopic device were submitted for interpretation post-operatively. Please see the procedural report for the amount of contrast and the fluoroscopy time utilized. COMPARISON:  Cholecystostomy tube injection 10/29/2016 FINDINGS: Opacification of the intrahepatic and extrahepatic biliary system. There is moderate dilatation of the biliary system. Question minimal drainage into the duodenum. There appears to be extravasation of contrast near the injection site. Cannot exclude sludge within the common bile duct. IMPRESSION: Dilatation of the biliary system without significant drainage into the duodenum. Question minimal filling of the duodenum. Findings are concerning for narrowing or obstruction in the distal common bile duct. Electronically Signed   By: Markus Daft M.D.   On: 11/13/2016 14:34   Nm Hepatobiliary Liver Func  Result Date: 11/17/2016 CLINICAL DATA:  83 year old male status post cholecystectomy. Evaluate for bile leak. EXAM: NUCLEAR MEDICINE HEPATOBILIARY IMAGING TECHNIQUE: Sequential images of the abdomen were obtained out to 60 minutes following intravenous administration of radiopharmaceutical. RADIOPHARMACEUTICALS:  5.15 mCi Tc-80m Choletec IV COMPARISON:  Abdominal CT dated 11/16/2016 FINDINGS: Prompt uptake and biliary excretion of activity by the liver is seen. There is accumulation of radiotracer outside of the biliary tree corresponding to the fluid collection seen on the CT within the gallbladder fossa as well as sacculation of the radiopharmaceutical within the fluid collection along the inferior surface of the left lobe of the  liver. Findings most consistent with contained bite leak or biloma. No extension of radiotracer noted inferiorly within the abdomen. IMPRESSION: Extra-biliary collection of radiopharmaceutical in the gallbladder fossa and along the inferior surface of the left lobe of the liver corresponding to the fluid collections seen on the CT and most consistent with contained bite leak/biloma. Electronically Signed   By: AAnner CreteM.D.   On: 11/17/2016 00:41   Ct Abdomen Pelvis W Contrast  Result Date: 11/20/2016 CLINICAL DATA:  Cholecystectomy 1 week prior, biloma. Persistent pain. EXAM: CT ABDOMEN AND PELVIS WITH CONTRAST TECHNIQUE: Multidetector CT imaging of  the abdomen and pelvis was performed using the standard protocol following bolus administration of intravenous contrast. CONTRAST:  131m ISOVUE-300 IOPAMIDOL (ISOVUE-300) INJECTION 61% COMPARISON:  CT 11/16/2016 FINDINGS: Lower chest: Decreased bilateral pleural effusions and associated bibasilar atelectasis. Coronary artery calcifications again seen. Hepatobiliary: Biliary stent in the common bile duct with improved in near completely resolved intra and extrahepatic biliary ductal dilatation. Fluid collection in the gallbladder fossa measures 7 x 4.4 cm and contains heterogeneous density and internal foci of air. Contiguous fluid tracks adjacent to the lateral left lobe of the liver, collection currently measuring 6.6 x 4.5 cm. Tracking free fluid adjacent to the stomach in caudate lobe of the liver, similar to prior, with decreased fluid tracking in the left upper quadrant. Pancreas: No ductal dilatation or inflammation. Spleen: Calcified granuloma.  Decreased perisplenic fluid. Adrenals/Urinary Tract: No adrenal nodule. Bilateral renal cysts are unchanged. No hydronephrosis. Symmetric enhancement and excretion on delayed phase imaging. Urinary bladder is physiologically distended without wall thickening. Stomach/Bowel: Colonic diverticulosis from the  splenic flexure to the sigmoid. No diverticulitis. No small bowel dilatation or obstruction. Enteric contrast reaches the colon. Normal appendix. Stomach physiologically distended. Vascular/Lymphatic: Calcified and noncalcified atheromatous plaque throughout the abdominal aorta without aneurysm. retroaortic left renal vein. No bulky adenopathy, small retroperitoneal lymph nodes. Reproductive: Prostate is unremarkable. Other: Small soft tissue nodule in the right anterior abdominal wall may be postsurgical. Trace fluid in the right pericolic gutter, improved from prior exam. Minimal air about the falciform ligament may be secondary to recent ERCP. Free intra-abdominal air has otherwise resolved. Fat within both inguinal canals. Musculoskeletal: Again seen degenerative change throughout spine. Unchanged osseous structures. IMPRESSION: 1. Placement of biliary stent with complete resolution of biliary dilatation. 2. Biloma in the gallbladder fossa and adjacent to the left lobe of the liver, unchanged in size from prior CT. There is decreased free fluid tracking in the left upper quadrant and right pericolic gutter. 3. Resolving small bowel ileus. No evidence of obstruction. Colonic diverticulosis without acute inflammation. 4. Decreasing bilateral pleural effusions and bibasilar atelectasis. Electronically Signed   By: MJeb LeveringM.D.   On: 11/20/2016 21:26   Ct Abdomen Pelvis W Contrast  Result Date: 11/16/2016 CLINICAL DATA:  Patient with recent cholecystectomy 3 days prior. Dilated intrahepatic and extrahepatic bile ducts. Evaluate for biloma. EXAM: CT ABDOMEN AND PELVIS WITH CONTRAST TECHNIQUE: Multidetector CT imaging of the abdomen and pelvis was performed using the standard protocol following bolus administration of intravenous contrast. CONTRAST:  104mISOVUE-300 IOPAMIDOL (ISOVUE-300) INJECTION 61% COMPARISON:  CT abdomen pelvis 10/21/2016. FINDINGS: Lower chest: Normal heart size. Coronary arterial  vascular calcifications. Small layering bilateral pleural effusions. Subpleural ground-glass and consolidative opacities within the lower lobes bilaterally. Hepatobiliary: Liver is normal in size and contour. Postsurgical changes compatible with interval cholecystectomy. There is flocculent fluid demonstrated within the gallbladder fossa. There are a few small foci of gas within the gallbladder fossa. Additionally, there is fluid extending from the gallbladder fossa into the gastrohepatic ligament and about the liver. Additionally fluid extends within the left upper quadrant around the spleen. Mild intrahepatic and extrahepatic biliary ductal dilatation. Pancreas: Mild pancreatic atrophy. Spleen: Unremarkable. Adrenals/Urinary Tract: The adrenal glands are normal. 1.5 cm cyst superior pole right kidney, 1.5 cm cyst inferior pole right kidney and 1.9 cm cyst interpolar region left kidney. There is a 1.5 cm cyst within the superior pole left kidney. Urinary bladder is unremarkable. Stomach/Bowel: Sigmoid colonic diverticulosis. Small amount of free fluid in the pelvis. Free intraperitoneal  air within the upper abdomen. Normal appendix. There a few mildly distended loops of small bowel within the central abdomen. Normal morphology of the stomach. Vascular/Lymphatic: Peripheral calcified atherosclerotic plaque. Retroaortic left renal vein. No retroperitoneal lymphadenopathy. Reproductive: Prostate unremarkable. Other: Small bilateral fat containing inguinal hernias, left-greater-than-right. Musculoskeletal: Multilevel degenerative disc disease. Grade 1 anterolisthesis L4 on L5. IMPRESSION: Postsurgical changes compatible with interval cholecystectomy. Flocculent fluid within the gallbladder fossa likely secondary to postsurgical change/ surgical material. Recommend correlation with operative history. Additionally, there is fluid extending from the cholecystectomy site into the central abdomen and left upper quadrant  which is nonspecific however may be secondary to a bile leak. Recommend correlation with HIDA scan. Small amount of free intraperitoneal air within the upper abdomen likely reflective of recent postoperative state. Mild intrahepatic and extrahepatic biliary ductal dilatation. Aortic atherosclerosis. Mild distention of the small bowel centrally likely secondary to ileus. Small layering bilateral pleural effusions with underlying ground-glass consolidative opacities favored to represent atelectasis. Infection not excluded. These results will be called to the ordering clinician or representative by the Radiologist Assistant, and communication documented in the PACS or zVision Dashboard. Electronically Signed   By: Lovey Newcomer M.D.   On: 11/16/2016 20:04   Dg Ercp Biliary & Pancreatic Ducts  Result Date: 11/17/2016 CLINICAL DATA:  Bile leak with stent placement and sphincterotomy. EXAM: ERCP TECHNIQUE: Multiple spot images obtained with the fluoroscopic device and submitted for interpretation post-procedure. FLUOROSCOPY TIME:  Fluoroscopy Time:  19 minutes and 42 seconds Number of Acquired Spot Images: 8 COMPARISON:  Abdominal CT 11/16/2016. Hepatobiliary examination 11/16/2016 FINDINGS: First set of images demonstrate cannulation and opacification of the main pancreatic duct. The biliary system was cannulated and opacified with contrast. Final image demonstrates a catheter or stent in the common bile duct. Limited evaluation for contrast extravasation. IMPRESSION: Cannulation and opacification of the biliary system for sphincterotomy and stent placement. These images were submitted for radiologic interpretation only. Please see the procedural report for the amount of contrast and the fluoroscopy time utilized. Electronically Signed   By: Markus Daft M.D.   On: 11/17/2016 14:59   Dg Abd Portable 1v  Result Date: 11/20/2016 CLINICAL DATA:  Abdominal pain and distention. EXAM: PORTABLE ABDOMEN - 1 VIEW COMPARISON:   CT 11/16/2016 FINDINGS: Biliary stent noted. Abdominal gas pattern is negative for bowel obstruction or perforation. IMPRESSION: Normal abdominal gas pattern.  Biliary stent is visible. Electronically Signed   By: Andreas Newport M.D.   On: 11/20/2016 02:18   Ct Image Guided Fluid Drain By Catheter  Result Date: 11/22/2016 INDICATION: History of cholecystectomy complicated by bile leak necessitating placement of a biliary stent. Unfortunately, the patient has developed postoperative fluid collections about the gallbladder fossa and about the anterior inferior aspect the left lobe of the liver. Request made for percutaneous drainage catheter(s) placement. EXAM: 1. CT GUIDED DRAINAGE OF GALLBLADDER FOSSA ABSCESS 2. CT-GUIDED DRAINAGE OF INDETERMINATE FLUID COLLECTION ABOUT THE ANTERIOR CAUDAL ASPECT THE LEFT LOBE OF THE LIVER COMPARISON:  CT abdomen pelvis - 11/20/2016 MEDICATIONS: The patient is currently admitted to the hospital and receiving intravenous antibiotics. The antibiotics were administered within an appropriate time frame prior to the initiation of the procedure. ANESTHESIA/SEDATION: Moderate (conscious) sedation was employed during this procedure. A total of Versed 3 mg and Fentanyl 125 mcg was administered intravenously. Moderate Sedation Time: 30 minutes. The patient's level of consciousness and vital signs were monitored continuously by radiology nursing throughout the procedure under my direct supervision. CONTRAST:  None  COMPLICATIONS: None immediate. PROCEDURE: Informed written consent was obtained from the patient and the patient's daughter after a discussion of the risks, benefits and alternatives to treatment. The patient was placed supine on the CT gantry and a pre procedural CT was performed re-demonstrating the known abscess/fluid collection within the gallbladder fossa with dominant component measuring approximately 8.0 x 4.4 cm (image 45, series 2 an additional component about the left  lobe of the liver measuring approximately 5.6 x 4.3 cm (image 41, series 2). The procedure was planned. A timeout was performed prior to the initiation of the procedure. The skin overlying the right upper abdominal quadrant was prepped and draped in the usual sterile fashion. The overlying soft tissues were anesthetized with 1% lidocaine with epinephrine. Attention was initially paid towards placement of the gallbladder fossa abscess drainage catheter. Appropriate trajectory was planned with the use of a 22 gauge spinal needle. An 18 gauge trocar needle was advanced into the abscess/fluid collection and a short Amplatz super stiff wire was coiled within the collection. Appropriate positioning was confirmed with a limited CT scan. The tract was serially dilated allowing placement of a 10 [place] all-purpose drainage catheter. Appropriate positioning was confirmed with a limited postprocedural CT scan. Approximately 50 Ml of blood tinged bilious fluid was aspirated. The tube was connected to a JP bulb and sutured in place. Following placement of the initial percutaneous drainage catheter, CT imaging demonstrated a persistent collection about the anterior inferior aspect the left lobe of the liver no sustained additional percutaneous drainage catheter placement. As such, appropriate trajectory was planned with the use of a 22 gauge spinal needle. An 18 gauge trocar needle was advanced into the abscess/fluid collection and a short Amplatz super stiff wire was coiled within the collection. Appropriate positioning was confirmed with a limited CT scan. The tract was serially dilated allowing placement of a 10 [place] all-purpose drainage catheter. Appropriate positioning was confirmed with a limited postprocedural CT scan. Approximately 10 Ml of serous fluid was aspirated. The tube was connected to a JP bulb and sutured in place. A dressing was placed. The patient tolerated the procedure well without immediate post  procedural complication. IMPRESSION: 1. Successful CT guided placement of a 10 [place] all purpose drain catheter into the gallbladder fossa abscess with aspiration of 50 mL of blood tinged bilious fluid. 2. Successful CT guided placement of a 10 [place] all purpose drain catheter into the indeterminate fluid collection about the anterior inferior aspect the left lobe of the liver with aspiration of 10 mL of serous fluid. 3. Samples were sent separately to the laboratory as requested by the ordering clinical team. Electronically Signed   By: Sandi Mariscal M.D.   On: 11/22/2016 10:58   Ct Image Guided Drainage By Percutaneous Catheter  Result Date: 11/22/2016 INDICATION: History of cholecystectomy complicated by bile leak necessitating placement of a biliary stent. Unfortunately, the patient has developed postoperative fluid collections about the gallbladder fossa and about the anterior inferior aspect the left lobe of the liver. Request made for percutaneous drainage catheter(s) placement. EXAM: 1. CT GUIDED DRAINAGE OF GALLBLADDER FOSSA ABSCESS 2. CT-GUIDED DRAINAGE OF INDETERMINATE FLUID COLLECTION ABOUT THE ANTERIOR CAUDAL ASPECT THE LEFT LOBE OF THE LIVER COMPARISON:  CT abdomen pelvis - 11/20/2016 MEDICATIONS: The patient is currently admitted to the hospital and receiving intravenous antibiotics. The antibiotics were administered within an appropriate time frame prior to the initiation of the procedure. ANESTHESIA/SEDATION: Moderate (conscious) sedation was employed during this procedure. A total of  Versed 3 mg and Fentanyl 125 mcg was administered intravenously. Moderate Sedation Time: 30 minutes. The patient's level of consciousness and vital signs were monitored continuously by radiology nursing throughout the procedure under my direct supervision. CONTRAST:  None COMPLICATIONS: None immediate. PROCEDURE: Informed written consent was obtained from the patient and the patient's daughter after a discussion of  the risks, benefits and alternatives to treatment. The patient was placed supine on the CT gantry and a pre procedural CT was performed re-demonstrating the known abscess/fluid collection within the gallbladder fossa with dominant component measuring approximately 8.0 x 4.4 cm (image 45, series 2 an additional component about the left lobe of the liver measuring approximately 5.6 x 4.3 cm (image 41, series 2). The procedure was planned. A timeout was performed prior to the initiation of the procedure. The skin overlying the right upper abdominal quadrant was prepped and draped in the usual sterile fashion. The overlying soft tissues were anesthetized with 1% lidocaine with epinephrine. Attention was initially paid towards placement of the gallbladder fossa abscess drainage catheter. Appropriate trajectory was planned with the use of a 22 gauge spinal needle. An 18 gauge trocar needle was advanced into the abscess/fluid collection and a short Amplatz super stiff wire was coiled within the collection. Appropriate positioning was confirmed with a limited CT scan. The tract was serially dilated allowing placement of a 10 [place] all-purpose drainage catheter. Appropriate positioning was confirmed with a limited postprocedural CT scan. Approximately 50 Ml of blood tinged bilious fluid was aspirated. The tube was connected to a JP bulb and sutured in place. Following placement of the initial percutaneous drainage catheter, CT imaging demonstrated a persistent collection about the anterior inferior aspect the left lobe of the liver no sustained additional percutaneous drainage catheter placement. As such, appropriate trajectory was planned with the use of a 22 gauge spinal needle. An 18 gauge trocar needle was advanced into the abscess/fluid collection and a short Amplatz super stiff wire was coiled within the collection. Appropriate positioning was confirmed with a limited CT scan. The tract was serially dilated allowing  placement of a 10 [place] all-purpose drainage catheter. Appropriate positioning was confirmed with a limited postprocedural CT scan. Approximately 10 Ml of serous fluid was aspirated. The tube was connected to a JP bulb and sutured in place. A dressing was placed. The patient tolerated the procedure well without immediate post procedural complication. IMPRESSION: 1. Successful CT guided placement of a 10 [place] all purpose drain catheter into the gallbladder fossa abscess with aspiration of 50 mL of blood tinged bilious fluid. 2. Successful CT guided placement of a 10 [place] all purpose drain catheter into the indeterminate fluid collection about the anterior inferior aspect the left lobe of the liver with aspiration of 10 mL of serous fluid. 3. Samples were sent separately to the laboratory as requested by the ordering clinical team. Electronically Signed   By: Sandi Mariscal M.D.   On: 11/22/2016 10:58   US Abdomen Limited Ruq  Result Date: 11/15/2016 CLINICAL DATA:  Abdominal pain EXAM: US ABDOMEN LIMITED - RIGHT UPPER QUADRANT COMPARISON:  CT 1 month 1,016 FINDINGS: Gallbladder: Prior cholecystectomy Common bile duct: Diameter: Dilated, 12 mm. Dilated duct as seen on intraoperative cholangiogram. History new since prior CT. Liver: Intrahepatic biliary ductal dilatation. Increased echotexture throughout the liver. IMPRESSION: Prior cholecystectomy. Dilated intrahepatic and extrahepatic biliary ducts. Common bile duct dilated to 12 mm. Cannot exclude distal obstructing duct stone. This could be further evaluated with ERCP or MRCP. Fatty  liver. Electronically Signed   By: Rolm Baptise M.D.   On: 11/15/2016 12:23    Assessment and Plan:   DVID PENDRY is a 83 y.o. y/o male Who comes in for follow-up after having an ERCP in [place].  The patient will be set up for a ERCP with stent removal in 2-1/2 months when the patient has had enough time for the leak to seal itself off.  The patient will also follow-up  with surgery to assess whether to remove the percutaneous drain.  The patient has been explained the plan and agrees with it.    Lucilla Lame, MD. Marval Regal   Note: This dictation was prepared with Dragon dictation along with smaller phrase technology. Any transcriptional errors that result from this process are unintentional.

## 2016-12-04 ENCOUNTER — Ambulatory Visit (INDEPENDENT_AMBULATORY_CARE_PROVIDER_SITE_OTHER): Payer: Medicare Other | Admitting: Internal Medicine

## 2016-12-04 ENCOUNTER — Encounter: Payer: Self-pay | Admitting: Internal Medicine

## 2016-12-04 VITALS — BP 120/60 | HR 73 | Temp 97.8°F | Resp 16 | Wt 174.0 lb

## 2016-12-04 DIAGNOSIS — R339 Retention of urine, unspecified: Secondary | ICD-10-CM

## 2016-12-04 DIAGNOSIS — E1165 Type 2 diabetes mellitus with hyperglycemia: Secondary | ICD-10-CM | POA: Diagnosis not present

## 2016-12-04 DIAGNOSIS — K838 Other specified diseases of biliary tract: Secondary | ICD-10-CM

## 2016-12-04 DIAGNOSIS — IMO0002 Reserved for concepts with insufficient information to code with codable children: Secondary | ICD-10-CM

## 2016-12-04 DIAGNOSIS — R7989 Other specified abnormal findings of blood chemistry: Secondary | ICD-10-CM | POA: Diagnosis not present

## 2016-12-04 DIAGNOSIS — E875 Hyperkalemia: Secondary | ICD-10-CM | POA: Diagnosis not present

## 2016-12-04 DIAGNOSIS — E440 Moderate protein-calorie malnutrition: Secondary | ICD-10-CM

## 2016-12-04 DIAGNOSIS — E1151 Type 2 diabetes mellitus with diabetic peripheral angiopathy without gangrene: Secondary | ICD-10-CM

## 2016-12-04 DIAGNOSIS — R945 Abnormal results of liver function studies: Secondary | ICD-10-CM

## 2016-12-04 DIAGNOSIS — A419 Sepsis, unspecified organism: Secondary | ICD-10-CM | POA: Diagnosis not present

## 2016-12-04 NOTE — Patient Instructions (Addendum)
You are looking much better!!!!  Continue your current medications  We will repeat your A1c in mid/late march   Your urinary frequency is due to enlarged prostate   Check at home to see if you are taking tamsulosin  Which is for enlarged bladder ("flomax")

## 2016-12-04 NOTE — Progress Notes (Signed)
Pre visit review using our clinic review tool, if applicable. No additional management support is needed unless otherwise documented below in the visit note. 

## 2016-12-04 NOTE — Progress Notes (Signed)
Subjective:  Patient ID: Henry Lindsey, male    DOB: 1933-09-21  Age: 83 y.o. MRN: [id number]  CC: The primary encounter diagnosis was Hyperkalemia. Diagnoses of Sepsis, due to unspecified organism ([place]), Elevated LFTs, Incomplete emptying of bladder, Malnutrition of moderate degree, Bile duct leak, and Type 2 diabetes, uncontrolled, with peripheral circulatory disorder [facility]) were also pertinent to this visit.  HPI Henry Lindsey presents for hospital follow up,.  He was admitted to [facility] on Jan 24th for an elective laparoscopic cholecystectomy and discharged home the following day. He was readmitted on Jan 26 with severe abdominal pain  secondary to biliary leak. He underwent an ERCP with stent placed Jan 28, but had persistent abdominal pain and required placement of 2 biliary drains on Feb 2. By general surgery , cultures were negative and his empiric antibiotics were completed. He was discharged home on Feb 4 with 2 drains in place .  Since discharge he has been seen by Dr. Burt Knack, and  Dr. Allen Norris and is scheduled to see Dr Hampton Abbot tomorrow.   Labs from 2/7 reviewed  alk phos elevated but coming down.  Albumin improving from  2.0 to 3.1  Cr worsening now 1.3 from 1.08    Electrolytes fine  Anemia improving ,  White count slightly up at 11.1   Pleural effusions noted on chet x ray feb 2 have resolved by films done on feb 9   Since discharge the epigastric drain has been removed by Dr. Burt Knack  On Feb 7.  The remaining right sided drain is scheduled to be removed  tomorrow and the stent is to be removed by Dr Allen Norris on June 24tth   He feels much better.  His blood sugars have reportedly been  below 130 both fasting and post prandially  He is still troubled by nocturia x 6 x  For the past 2 years,.  He has BPH and is supposed to be taking flomax  Per med ist but no recent refills appear to have been done   Has lost his hair ,  Sense of taste.  He states that he has also lost his hearing .  He  attributes all losses to the medication he was prescribed for treatment of  squamous cell skin ca    No fevers,  occasional loose stools    Lab Results  Component Value Date   HGBA1C 7.6 (H) 10/05/2016    Outpatient Medications Prior to Visit  Medication Sig Dispense Refill  . Alum & Mag Hydroxide-Simeth (MAGIC MOUTHWASH) SOLN Take 5 mLs by mouth 3 (three) times daily as needed for mouth pain.    [name] amLODipine (NORVASC) 10 MG tablet Take 1 tablet (10 mg total) by mouth daily. 90 tablet 3  . aspirin 81 MG tablet Take 1 tablet (81 mg total) by mouth daily.    [name] atorvastatin (LIPITOR) 40 MG tablet TAKE 1 TABLET BY MOUTH EVERY DAY 90 tablet 2  . Blood Glucose Monitoring Suppl (ONE TOUCH ULTRA SYSTEM KIT) w/Device KIT 1 kit by Does not apply route once. Use DX code E11.59 1 each 0  . Cholecalciferol (VITAMIN D3) 2000 units TABS Take 1 tablet by mouth daily.    . cilostazol (PLETAL) 100 MG tablet TAKE 1 TABLET(100 MG) BY MOUTH TWICE DAILY 60 tablet 3  . fenofibrate micronized (LOFIBRA) 134 MG capsule TAKE 1 CAPSULE BY MOUTH EVERY MORNING BEFORE BREAKFAST (Patient taking differently: TAKE 1 CAPSULE tid) 30 capsule 0  . gabapentin (NEURONTIN) 400  MG capsule TAKE 1 CAPSULE(400 MG) BY MOUTH THREE TIMES DAILY 120 capsule 2  . glipiZIDE (GLUCOTROL) 10 MG tablet TAKE 1 TABLET(10 MG) BY MOUTH TWICE DAILY 60 tablet 4  . glucose blood test strip Use as instructed three times daily 100 each 12  . INS SYRINGE/NEEDLE 1CC/28G (B-D INSULIN SYRINGE 1CC/28G) 28G X 1/2" 1 ML MISC USE TO ADMINISTER INSULIN DAILY 100 each 0  . insulin lispro protamine-lispro (HUMALOG MIX 75/25) (75-25) 100 UNIT/ML SUSP injection Inject 10 units right before  breakfast and 6 units before dinner (Patient taking differently: 6-8 Units 2 (two) times daily. Inject 6 qam and 8 qpm) 10 mL 5  . JANUVIA 100 MG tablet TAKE 1 TABLET(100 MG) BY MOUTH DAILY. NOTE DOSE INCREASE 30 tablet 3  . latanoprost (XALATAN) 0.005 % ophthalmic solution  INSTILL 1 DROP INTO BOTH EYES QD  5  . metFORMIN (GLUCOPHAGE) 1000 MG tablet TAKE 1 TABLET BY MOUTH TWICE DAILY 180 tablet 1  . metoprolol succinate (TOPROL-XL) 100 MG 24 hr tablet Take 1 tablet (100 mg total) by mouth daily. Take with or immediately following a meal. 90 tablet 3  . Multiple Vitamin (MULTIVITAMIN WITH MINERALS) TABS tablet Take 1 tablet by mouth daily.    . nitroGLYCERIN (NITROSTAT) 0.4 MG SL tablet Place 1 tablet (0.4 mg total) under the tongue every 5 (five) minutes as needed for chest pain. MAXIMUM 3 TABLETS 50 tablet 3  . ONETOUCH DELICA LANCETS 50D MISC Use three times daily to check blood sugar. 100 each 11  . pantoprazole (PROTONIX) 40 MG tablet TAKE 1 TABLET(40 MG) BY MOUTH TWICE DAILY BEFORE A MEAL 60 tablet 0  . traMADol (ULTRAM) 50 MG tablet Take 50 mg by mouth at bedtime.     [name] HYDROcodone-acetaminophen (NORCO/VICODIN) 5-325 MG tablet Take 1-2 tablets by mouth every 6 (six) hours as needed for moderate pain. 30 tablet 0  . ondansetron (ZOFRAN) 4 MG tablet Take 1 tablet (4 mg total) by mouth every 8 (eight) hours as needed for nausea or vomiting. 30 tablet 3  . tamsulosin (FLOMAX) 0.4 MG CAPS capsule Take 0.4 mg by mouth daily after breakfast.     No facility-administered medications prior to visit.     Review of Systems;  Patient denies headache, fevers, malaise, unintentional weight loss, skin rash, eye pain, sinus congestion and sinus pain, sore throat, dysphagia,  hemoptysis , cough, dyspnea, wheezing, chest pain, palpitations, orthopnea, edema, abdominal pain, nausea, melena, diarrhea, constipation, flank pain, dysuria, hematuria, urinary  Frequency, nocturia, numbness, tingling, seizures,  Focal weakness, Loss of consciousness,  Tremor, insomnia, depression, anxiety, and suicidal ideation.      Objective:  BP 120/60   Pulse 73   Temp 97.8 F (36.6 C) (Oral)   Resp 16   Wt 174 lb (78.9 kg)   SpO2 96%   BMI 24.27 kg/m   BP Readings from Last 3  Encounters:  12/05/16 120/66  12/04/16 120/60  11/27/16 126/75    Wt Readings from Last 3 Encounters:  12/05/16 173 lb (78.5 kg)  12/04/16 174 lb (78.9 kg)  11/27/16 169 lb (76.7 kg)    General appearance: alert, cooperative and appears stated age Ears: normal TM's and external ear canals both ears Throat: lips, mucosa, and tongue normal; teeth and gums normal Neck: no adenopathy, no carotid bruit, supple, symmetrical, trachea midline and thyroid not enlarged, symmetric, no tenderness/mass/nodules Back: symmetric, no curvature. ROM normal. No CVA tenderness. Lungs: clear to auscultation bilaterally Heart: regular rate and  rhythm, S1, S2 normal, no murmur, click, rub or gallop Abdomen: soft, mildly tender;  Surgical incisions without redness or erythema. bowel sounds normal; no masses,  no organomegaly Pulses: 2+ and symmetric Skin: Skin color, texture, turgor normal. No rashes or lesions Lymph nodes: Cervical, supraclavicular, and axillary nodes normal.  Lab Results  Component Value Date   HGBA1C 7.6 (H) 10/05/2016   HGBA1C 7.9 (H) 10/04/2016   HGBA1C 8.1 (H) 07/01/2016    Lab Results  Component Value Date   CREATININE 1.25 12/05/2016   CREATININE 1.31 (H) 11/27/2016   CREATININE 1.08 11/23/2016    Lab Results  Component Value Date   WBC 9.7 12/05/2016   HGB 10.6 (L) 12/05/2016   HCT 32.6 (L) 12/05/2016   PLT 575.0 (H) 12/05/2016   GLUCOSE 255 (H) 12/05/2016   CHOL 131 10/04/2016   TRIG 53.0 10/04/2016   HDL 58.70 10/04/2016   LDLDIRECT 62.0 10/04/2016   LDLCALC 61 10/04/2016   ALT 18 12/05/2016   AST 14 12/05/2016   NA 135 12/05/2016   K 5.4 (H) 12/05/2016   CL 102 12/05/2016   CREATININE 1.25 12/05/2016   BUN 34 (H) 12/05/2016   CO2 26 12/05/2016   TSH 2.943 10/20/2016   PSA 0.83 10/10/2014   INR 1.22 11/21/2016   HGBA1C 7.6 (H) 10/05/2016   MICROALBUR 1.7 10/04/2016    No results found.  Assessment & Plan:   Problem List Items Addressed This  Visit    Bile duct leak    Found during ercp on jan 289 during readmission for acute abd pain post cholecystectomy. S/p 2 percutaneous drains and one stent  During last admission,  Now s/p removal of the epigastric drain.       RESOLVED: Elevated LFTs   Hyperkalemia - Primary    Mild, Etiology unclear,  As he is not taking and ace inhibitor or an ARB.  No potassium supplements.  Will repeat next week       Relevant Orders   Basic metabolic panel   Incomplete emptying of bladder    With nocturia x 6 due to BPH.  Resume flomax,  Urology referral made. No signs of UTI today  .lastmic      Malnutrition of moderate degree    Secondary to prolonged anorexia dur to cholecystitis and complications of surgeryyr,  Albumin is improving.       RESOLVED: Sepsis ([place])   Type 2 diabetes, uncontrolled, with peripheral circulatory disorder ([place])    Recurrent post operative hypoglycemia has now resolved. Continue  glipizide 10 mg bid ,  insulin 75/25 10 units in the am and 6 units  pm,   and januvia  . Repeat a1c mid march   Lab Results  Component Value Date   HGBA1C 7.6 (H) 10/05/2016          A total of 40 minutes was spent with patient more than half of which was spent in counseling patient on the above mentioned issues , reviewing and explaining recent labs and imaging studies done, and coordination of care.  I am having Mr. Grimaldo maintain his traMADol, magic mouthwash, ONE TOUCH ULTRA SYSTEM KIT, ONETOUCH DELICA LANCETS 30Z, latanoprost, INS SYRINGE/NEEDLE 1CC/28G, glipiZIDE, glucose blood, insulin lispro protamine-lispro, atorvastatin, metFORMIN, JANUVIA, cilostazol, fenofibrate micronized, gabapentin, nitroGLYCERIN, multivitamin with minerals, aspirin, pantoprazole, amLODipine, metoprolol succinate, and Vitamin D3.  No orders of the defined types were placed in this encounter.   There are no discontinued medications.  Follow-up: No Follow-up on file.  [name], MD

## 2016-12-05 ENCOUNTER — Encounter: Payer: Self-pay | Admitting: Surgery

## 2016-12-05 ENCOUNTER — Other Ambulatory Visit: Payer: Self-pay | Admitting: Internal Medicine

## 2016-12-05 ENCOUNTER — Ambulatory Visit (INDEPENDENT_AMBULATORY_CARE_PROVIDER_SITE_OTHER): Payer: Medicare Other | Admitting: Surgery

## 2016-12-05 ENCOUNTER — Telehealth: Payer: Self-pay | Admitting: *Deleted

## 2016-12-05 VITALS — BP 120/66 | HR 79 | Temp 98.3°F | Wt 173.0 lb

## 2016-12-05 DIAGNOSIS — Z9049 Acquired absence of other specified parts of digestive tract: Secondary | ICD-10-CM

## 2016-12-05 DIAGNOSIS — K838 Other specified diseases of biliary tract: Secondary | ICD-10-CM

## 2016-12-05 LAB — CBC WITH DIFFERENTIAL/PLATELET
BASOS ABS: 0.1 10*3/uL (ref 0.0–0.1)
BASOS PCT: 1.3 % (ref 0.0–3.0)
EOS ABS: 0.6 10*3/uL (ref 0.0–0.7)
Eosinophils Relative: 6.5 % — ABNORMAL HIGH (ref 0.0–5.0)
HEMATOCRIT: 32.6 % — AB (ref 39.0–52.0)
HEMOGLOBIN: 10.6 g/dL — AB (ref 13.0–17.0)
LYMPHS PCT: 20.1 % (ref 12.0–46.0)
Lymphs Abs: 1.9 10*3/uL (ref 0.7–4.0)
MCHC: 32.4 g/dL (ref 30.0–36.0)
MCV: 81.3 fl (ref 78.0–100.0)
MONOS PCT: 6.9 % (ref 3.0–12.0)
Monocytes Absolute: 0.7 10*3/uL (ref 0.1–1.0)
NEUTROS ABS: 6.3 10*3/uL (ref 1.4–7.7)
Neutrophils Relative %: 65.2 % (ref 43.0–77.0)
PLATELETS: 575 10*3/uL — AB (ref 150.0–400.0)
RBC: 4.01 Mil/uL — ABNORMAL LOW (ref 4.22–5.81)
RDW: 15.3 % (ref 11.5–15.5)
WBC: 9.7 10*3/uL (ref 4.0–10.5)

## 2016-12-05 LAB — COMPREHENSIVE METABOLIC PANEL
ALBUMIN: 3.6 g/dL (ref 3.5–5.2)
ALK PHOS: 114 U/L (ref 39–117)
ALT: 18 U/L (ref 0–53)
AST: 14 U/L (ref 0–37)
BILIRUBIN TOTAL: 0.5 mg/dL (ref 0.2–1.2)
BUN: 34 mg/dL — ABNORMAL HIGH (ref 6–23)
CALCIUM: 8.9 mg/dL (ref 8.4–10.5)
CO2: 26 meq/L (ref 19–32)
CREATININE: 1.25 mg/dL (ref 0.40–1.50)
Chloride: 102 mEq/L (ref 96–112)
GFR: 58.83 mL/min — AB (ref >60.00)
Glucose, Bld: 255 mg/dL — ABNORMAL HIGH (ref 70–99)
Potassium: 5.4 mEq/L — ABNORMAL HIGH (ref 3.5–5.1)
Sodium: 135 mEq/L (ref 135–145)
Total Protein: 6.5 g/dL (ref 6.0–8.3)

## 2016-12-05 MED ORDER — TAMSULOSIN HCL 0.4 MG PO CAPS: 0.4000 mg | ORAL_CAPSULE | Freq: Every day | ORAL | 0 refills | Status: DC

## 2016-12-05 NOTE — Patient Instructions (Signed)
Please call our office with any questions or concerns.  Please do not submerge in a tub, hot tub, or pool until incisions are completely sealed.  Use sun block to incision area over the next year if this area will be exposed to sun. This helps decrease scarring.  You may resume your normal activities on 12/12/2016. At that time- Listen to your body when lifting, if you have pain when lifting, stop and then try again in a few days. Pain after doing exercises or activities of daily living is normal as you get back in to your normal routine.  If you develop redness, drainage, or pain at incision sites- call our office immediately and speak with a nurse.

## 2016-12-05 NOTE — Telephone Encounter (Signed)
refilled 

## 2016-12-05 NOTE — Progress Notes (Signed)
12/05/2016  HPI: Patient is s/p laparoscopic cholecystectomy on 0000000, which was complicated post-operatively by a bile leak requiring ERCP and stent placement on 1/28 at [facility].  He had a biloma that was drained with two percutaneous drains.  He was seen last week by Dr. Burt Knack, who removed the epigastric drain.    Patient reports that he has been doing well.  Has good appetite and daily bowel movements.  Denies any significant pain and only reports some soreness over the right upper quadrant drain when he lies on his right side.  Denies any fevers or chills, nausea or vomiting.  Reports only low volume drainage from his remaining drain.  Vital signs: BP 120/66   Pulse 79   Temp 98.3 F (36.8 C) (Oral)   Wt 78.5 kg (173 lb)   BMI 24.13 kg/m    Physical Exam: Constitutional:  No acute distress Abdomen:  Soft, nondistended, nontender to palpation.  Incisions are clean, dry, and intact and healing well, without any evidence of infection. Right sided percutaneous drain with serous fluid, no bilious fluid, no purulence.  Assessment/Plan: 83 yo male s/p laparoscopic cholecystectomy with post-operative bile leak requiring ERCP and stent placement.  --Remaining percutaneous drain has been removed at bedside without complications.  Dry dressing applied. --Instructed patient that he should call the office or come to hospital if any fevers, chills, worsening abdominal pain, nausea, vomiting, or jaundice. --Patient has one more week of no heavy lifting restriction.  He may resume his usual activities after that. --Patient will follow up with Dr. Allen Norris in April for repeat ERCP and stent removal.  He may follow up with [place] on an as needed basis.   Melvyn Neth, [place]

## 2016-12-05 NOTE — Telephone Encounter (Signed)
Pt requested a medication refill for flomax  Pharmacy [facility]

## 2016-12-05 NOTE — Telephone Encounter (Signed)
Ok to refill Flomax? Next appt is 01/02/17. Last OV was 12/04/16.

## 2016-12-07 ENCOUNTER — Encounter: Payer: Self-pay | Admitting: Internal Medicine

## 2016-12-07 DIAGNOSIS — E875 Hyperkalemia: Secondary | ICD-10-CM | POA: Insufficient documentation

## 2016-12-07 NOTE — Assessment & Plan Note (Signed)
Recurrent post operative hypoglycemia has now resolved. Continue  glipizide 10 mg bid ,  insulin 75/25 10 units in the am and 6 units  pm,   and januvia  . Repeat a1c mid march   Lab Results  Component Value Date   HGBA1C 7.6 (H) 10/05/2016

## 2016-12-07 NOTE — Assessment & Plan Note (Signed)
Mild, Etiology unclear,  As he is not taking and ace inhibitor or an ARB.  No potassium supplements.  Will repeat next week

## 2016-12-07 NOTE — Assessment & Plan Note (Signed)
Secondary to prolonged anorexia dur to cholecystitis and complications of surgeryyr,  Albumin is improving.

## 2016-12-07 NOTE — Assessment & Plan Note (Addendum)
With nocturia x 6 due to BPH.  Resume flomax,  Urology referral made. No signs of UTI today  .lastmic

## 2016-12-07 NOTE — Assessment & Plan Note (Addendum)
Found during ercp on jan 289 during readmission for acute abd pain post cholecystectomy. S/p 2 percutaneous drains and one stent  During last admission,  Now s/p removal of the epigastric drain.

## 2016-12-10 ENCOUNTER — Other Ambulatory Visit: Payer: Self-pay | Admitting: Internal Medicine

## 2016-12-16 ENCOUNTER — Ambulatory Visit: Payer: Medicare Other | Admitting: Nurse Practitioner

## 2016-12-17 ENCOUNTER — Encounter: Payer: Self-pay | Admitting: Internal Medicine

## 2016-12-17 ENCOUNTER — Other Ambulatory Visit (INDEPENDENT_AMBULATORY_CARE_PROVIDER_SITE_OTHER): Payer: Medicare Other

## 2016-12-17 DIAGNOSIS — E875 Hyperkalemia: Secondary | ICD-10-CM

## 2016-12-17 LAB — BASIC METABOLIC PANEL
BUN: 19 mg/dL (ref 6–23)
CO2: 28 mEq/L (ref 19–32)
Calcium: 8.9 mg/dL (ref 8.4–10.5)
Chloride: 100 mEq/L (ref 96–112)
Creatinine, Ser: 1.3 mg/dL (ref 0.40–1.50)
GFR: 56.22 mL/min — AB (ref >60.00)
GLUCOSE: 219 mg/dL — AB (ref 70–99)
POTASSIUM: 4.8 meq/L (ref 3.5–5.1)
SODIUM: 136 meq/L (ref 135–145)

## 2017-01-02 ENCOUNTER — Encounter: Payer: Self-pay | Admitting: Internal Medicine

## 2017-01-02 ENCOUNTER — Ambulatory Visit (INDEPENDENT_AMBULATORY_CARE_PROVIDER_SITE_OTHER): Payer: Medicare Other | Admitting: Internal Medicine

## 2017-01-02 VITALS — BP 110/58 | HR 77 | Resp 15 | Ht 71.0 in | Wt 184.2 lb

## 2017-01-02 DIAGNOSIS — E440 Moderate protein-calorie malnutrition: Secondary | ICD-10-CM | POA: Diagnosis not present

## 2017-01-02 DIAGNOSIS — I10 Essential (primary) hypertension: Secondary | ICD-10-CM | POA: Diagnosis not present

## 2017-01-02 DIAGNOSIS — M1732 Unilateral post-traumatic osteoarthritis, left knee: Secondary | ICD-10-CM | POA: Diagnosis not present

## 2017-01-02 DIAGNOSIS — E119 Type 2 diabetes mellitus without complications: Secondary | ICD-10-CM

## 2017-01-02 DIAGNOSIS — E1151 Type 2 diabetes mellitus with diabetic peripheral angiopathy without gangrene: Secondary | ICD-10-CM

## 2017-01-02 DIAGNOSIS — K838 Other specified diseases of biliary tract: Secondary | ICD-10-CM

## 2017-01-02 DIAGNOSIS — E875 Hyperkalemia: Secondary | ICD-10-CM | POA: Diagnosis not present

## 2017-01-02 DIAGNOSIS — M7061 Trochanteric bursitis, right hip: Secondary | ICD-10-CM | POA: Diagnosis not present

## 2017-01-02 DIAGNOSIS — E1159 Type 2 diabetes mellitus with other circulatory complications: Secondary | ICD-10-CM | POA: Diagnosis not present

## 2017-01-02 LAB — COMPREHENSIVE METABOLIC PANEL
ALK PHOS: 71 U/L (ref 39–117)
ALT: 15 U/L (ref 0–53)
AST: 14 U/L (ref 0–37)
Albumin: 4.3 g/dL (ref 3.5–5.2)
BUN: 32 mg/dL — AB (ref 6–23)
CHLORIDE: 101 meq/L (ref 96–112)
CO2: 26 meq/L (ref 19–32)
Calcium: 9.7 mg/dL (ref 8.4–10.5)
Creatinine, Ser: 1.48 mg/dL (ref 0.40–1.50)
GFR: 48.4 mL/min — ABNORMAL LOW (ref >60.00)
Glucose, Bld: 84 mg/dL (ref 70–99)
POTASSIUM: 4.7 meq/L (ref 3.5–5.1)
SODIUM: 135 meq/L (ref 135–145)
Total Bilirubin: 0.6 mg/dL (ref 0.2–1.2)
Total Protein: 7.2 g/dL (ref 6.0–8.3)

## 2017-01-02 LAB — LIPID PANEL
CHOL/HDL RATIO: 2
Cholesterol: 128 mg/dL (ref 0–200)
HDL: 54.1 mg/dL (ref >39.00)
LDL CALC: 52 mg/dL (ref 0–99)
NONHDL: 73.5
Triglycerides: 108 mg/dL (ref 0.0–149.0)
VLDL: 21.6 mg/dL (ref 0.0–40.0)

## 2017-01-02 LAB — POCT GLYCOSYLATED HEMOGLOBIN (HGB A1C): Hemoglobin A1C: 6.3

## 2017-01-02 NOTE — Progress Notes (Signed)
Subjective:  Patient ID: Henry Lindsey, male    DOB: September 01, 1933  Age: 83 y.o. MRN: [id number]  CC: The primary encounter diagnosis was Diabetes mellitus without complication ([place]). Diagnoses of Malnutrition of moderate degree, Hyperkalemia, Type 2 diabetes mellitus with other circulatory complications (CODE) ([place]), Peripheral vascular disease in diabetes mellitus ([place]), Bile duct leak, Essential hypertension, Trochanteric bursitis of right hip, and Post-traumatic osteoarthritis of left knee were also pertinent to this visit.  HPI Henry Lindsey presents for follow up on type 2 DM and other chronic issues. He was last seen feb 14th , at which time he was recovering from one of several hospitalizations for recurrent abdominal pain secondary to cholecystitis,   He has gained 10 lbs since his last visit one month ago.  Appetite good    Eating eggs,  Bacon ,  Vegetables,  No post prandial nausea , no reflux at night . Stools are back to being solid,  Brown  Still havong nocturia x 6 (almost every hour) despite limiting water   Percutaneous Drain removed last  month.  Still has a biliary stent  to be removed April 17 by darren wohl.  Patient has any questions about his prior procedures and what is going to be done next month,  And what he can expect. Prior procedures reviewed with patient today   Chronic pain secondaty to DJD hip and knee:  He has had  2 steroid injection, in knee and hip on March 7    DM:  He is using 6 units of 75/25 mixed insulin at breakfast and 8 before dinner  Lab Results  Component Value Date   CHOL 128 01/02/2017   HDL 54.10 01/02/2017   LDLCALC 52 01/02/2017   LDLDIRECT 62.0 10/04/2016   TRIG 108.0 01/02/2017   CHOLHDL 2 01/02/2017      Lab Results  Component Value Date   NA 135 01/02/2017   K 4.7 01/02/2017   CL 101 01/02/2017   CO2 26 01/02/2017   . Outpatient Medications Prior to Visit  Medication Sig Dispense Refill  . Alum & Mag Hydroxide-Simeth (MAGIC  MOUTHWASH) SOLN Take 5 mLs by mouth 3 (three) times daily as needed for mouth pain.    [name] amLODipine (NORVASC) 10 MG tablet Take 1 tablet (10 mg total) by mouth daily. 90 tablet 3  . amLODipine (NORVASC) 10 MG tablet TAKE 1 TABLET BY MOUTH EVERY DAY 30 tablet 5  . aspirin 81 MG tablet Take 1 tablet (81 mg total) by mouth daily.    [name] atorvastatin (LIPITOR) 40 MG tablet TAKE 1 TABLET BY MOUTH EVERY DAY 90 tablet 2  . Blood Glucose Monitoring Suppl (ONE TOUCH ULTRA SYSTEM KIT) w/Device KIT 1 kit by Does not apply route once. Use DX code E11.59 1 each 0  . Cholecalciferol (VITAMIN D3) 2000 units TABS Take 1 tablet by mouth daily.    . cilostazol (PLETAL) 100 MG tablet TAKE 1 TABLET(100 MG) BY MOUTH TWICE DAILY 60 tablet 3  . famotidine (PEPCID) 20 MG tablet TAKE 1 TABLET(20 MG) BY MOUTH TWICE DAILY 60 tablet 5  . fenofibrate micronized (LOFIBRA) 134 MG capsule TAKE 1 CAPSULE BY MOUTH EVERY MORNING BEFORE BREAKFAST (Patient taking differently: TAKE 1 CAPSULE tid) 30 capsule 0  . gabapentin (NEURONTIN) 400 MG capsule TAKE 1 CAPSULE(400 MG) BY MOUTH THREE TIMES DAILY 120 capsule 2  . glipiZIDE (GLUCOTROL) 10 MG tablet TAKE 1 TABLET(10 MG) BY MOUTH TWICE DAILY 60 tablet 4  . glucose  blood test strip Use as instructed three times daily 100 each 12  . INS SYRINGE/NEEDLE 1CC/28G (B-D INSULIN SYRINGE 1CC/28G) 28G X 1/2" 1 ML MISC USE TO ADMINISTER INSULIN DAILY 100 each 0  . insulin lispro protamine-lispro (HUMALOG MIX 75/25) (75-25) 100 UNIT/ML SUSP injection Inject 10 units right before  breakfast and 6 units before dinner (Patient taking differently: 6-8 Units 2 (two) times daily. Inject 6 qam and 8 qpm) 10 mL 5  . JANUVIA 100 MG tablet TAKE 1 TABLET(100 MG) BY MOUTH DAILY. NOTE DOSE INCREASE 30 tablet 3  . latanoprost (XALATAN) 0.005 % ophthalmic solution INSTILL 1 DROP INTO BOTH EYES QD  5  . metFORMIN (GLUCOPHAGE) 1000 MG tablet TAKE 1 TABLET BY MOUTH TWICE DAILY 180 tablet 1  . metoprolol succinate  (TOPROL-XL) 100 MG 24 hr tablet Take 1 tablet (100 mg total) by mouth daily. Take with or immediately following a meal. 90 tablet 3  . Multiple Vitamin (MULTIVITAMIN WITH MINERALS) TABS tablet Take 1 tablet by mouth daily.    . nitroGLYCERIN (NITROSTAT) 0.4 MG SL tablet Place 1 tablet (0.4 mg total) under the tongue every 5 (five) minutes as needed for chest pain. MAXIMUM 3 TABLETS 50 tablet 3  . ONETOUCH DELICA LANCETS 50P MISC Use three times daily to check blood sugar. 100 each 11  . pantoprazole (PROTONIX) 40 MG tablet TAKE 1 TABLET(40 MG) BY MOUTH TWICE DAILY BEFORE A MEAL 60 tablet 5  . tamsulosin (FLOMAX) 0.4 MG CAPS capsule Take 1 capsule (0.4 mg total) by mouth daily. 90 capsule 0  . traMADol (ULTRAM) 50 MG tablet Take 50 mg by mouth at bedtime.      No facility-administered medications prior to visit.     Review of Systems;  Patient denies headache, fevers, malaise, unintentional weight loss, skin rash, eye pain, sinus congestion and sinus pain, sore throat, dysphagia,  hemoptysis , cough, dyspnea, wheezing, chest pain, palpitations, orthopnea, edema, abdominal pain, nausea, melena, diarrhea, constipation, flank pain, dysuria, hematuria, urinary  Frequency, nocturia, numbness, tingling, seizures,  Focal weakness, Loss of consciousness,  Tremor, insomnia, depression, anxiety, and suicidal ideation.      Objective:  BP (!) 110/58 (BP Location: Left Arm, Patient Position: Sitting, Cuff Size: Normal)   Pulse 77   Resp 15   Ht _0  (1.803 m)   Wt 184 lb 3.2 oz (83.6 kg)   SpO2 92%   BMI 25.69 kg/m   BP Readings from Last 3 Encounters:  01/02/17 (!) 110/58  12/05/16 120/66  12/04/16 120/60    Wt Readings from Last 3 Encounters:  01/02/17 184 lb 3.2 oz (83.6 kg)  12/05/16 173 lb (78.5 kg)  12/04/16 174 lb (78.9 kg)    General appearance: alert, cooperative and appears stated age Ears: normal TM's and external ear canals both ears Throat: lips, mucosa, and tongue normal;  teeth and gums normal Neck: no adenopathy, no carotid bruit, supple, symmetrical, trachea midline and thyroid not enlarged, symmetric, no tenderness/mass/nodules Back: symmetric, no curvature. ROM normal. No CVA tenderness. Lungs: clear to auscultation bilaterally Heart: regular rate and rhythm, S1, S2 normal, no murmur, click, rub or gallop Abdomen: soft, non-tender; bowel sounds normal; no masses,  no organomegaly Pulses: 2+ and symmetric Skin: Skin color, texture, turgor normal. No rashes or lesions Lymph nodes: Cervical, supraclavicular, and axillary nodes normal.  Lab Results  Component Value Date   HGBA1C 6.3 01/02/2017   HGBA1C 7.6 (H) 10/05/2016   HGBA1C 7.9 (H) 10/04/2016  Lab Results  Component Value Date   CREATININE 1.48 01/02/2017   CREATININE 1.30 12/17/2016   CREATININE 1.25 12/05/2016    Lab Results  Component Value Date   WBC 9.7 12/05/2016   HGB 10.6 (L) 12/05/2016   HCT 32.6 (L) 12/05/2016   PLT 575.0 (H) 12/05/2016   GLUCOSE 84 01/02/2017   CHOL 128 01/02/2017   TRIG 108.0 01/02/2017   HDL 54.10 01/02/2017   LDLDIRECT 62.0 10/04/2016   LDLCALC 52 01/02/2017   ALT 15 01/02/2017   AST 14 01/02/2017   NA 135 01/02/2017   K 4.7 01/02/2017   CL 101 01/02/2017   CREATININE 1.48 01/02/2017   BUN 32 (H) 01/02/2017   CO2 26 01/02/2017   TSH 2.943 10/20/2016   PSA 0.83 10/10/2014   INR 1.22 11/21/2016   HGBA1C 6.3 01/02/2017   MICROALBUR 1.7 10/04/2016    No results found.  Assessment & Plan:   Problem List Items Addressed This Visit    Bile duct leak    Occurred during  ERCP and cbd stent placement during previous admission for cholecystitis required  percutaneous drains x 2 , now both removed . Awaiting removal of CBD stent       Bursitis of hip, right    s/p repeat steroid injection March 2018      Hyperkalemia    resolved.   Lab Results  Component Value Date   NA 135 01/02/2017   K 4.7 01/02/2017   CL 101 01/02/2017   CO2 26  01/02/2017         Hypertension    Well controlled on current regimen. Renal function stable, no changes today.  Lab Results  Component Value Date   CREATININE 1.48 01/02/2017   Lab Results  Component Value Date   NA 135 01/02/2017   K 4.7 01/02/2017   CL 101 01/02/2017   CO2 26 01/02/2017         Left knee DJD    s/p steroid injection March 7       Relevant Medications   HYDROcodone-acetaminophen (NORCO/VICODIN) 5-325 MG tablet   Malnutrition of moderate degree    Secondary to protracted gi symptoms surrounding his cholecysectomy.  His weight gain of 11 lbs is noted,  His appetite has improved.  Lab Results  Component Value Date   ALT 15 01/02/2017   AST 14 01/02/2017   ALKPHOS 71 01/02/2017   BILITOT 0.6 01/02/2017         Peripheral vascular disease in diabetes mellitus ([place])    He has asymptomatic restenosis, not critical of right sfa and sees  Dr dew every 6 months .  Feet are well perfused today       Type 2 diabetes mellitus with other circulatory complications (CODE) ([place])    Finally controlled due to unintentional weight loss.  No changes today.  Diet reivewed   Lab Results  Component Value Date   HGBA1C 6.3 01/02/2017          Other Visit Diagnoses    Diabetes mellitus without complication ([place])    -  Primary   Relevant Orders   POCT HgB A1C (Completed)   Lipid panel (Completed)   Comprehensive metabolic panel (Completed)     A total of 40 minutes was spent with patient more than half of which was spent in counseling patient on the above mentioned issues , reviewing and explaining recent labs and imaging studies done, and coordination of care. I am having Henry Lindsey  maintain his traMADol, magic mouthwash, ONE TOUCH ULTRA SYSTEM KIT, ONETOUCH DELICA LANCETS 37N, latanoprost, INS SYRINGE/NEEDLE 1CC/28G, glipiZIDE, glucose blood, insulin lispro protamine-lispro, atorvastatin, metFORMIN, JANUVIA, cilostazol, fenofibrate micronized, gabapentin,  nitroGLYCERIN, multivitamin with minerals, aspirin, amLODipine, metoprolol succinate, Vitamin D3, tamsulosin, famotidine, pantoprazole, amLODipine, and HYDROcodone-acetaminophen.  Meds ordered this encounter  Medications  . HYDROcodone-acetaminophen (NORCO/VICODIN) 5-325 MG tablet    Sig: TK 1 T PO BID PRN FOR KNEE PAIN    Refill:  0    There are no discontinued medications.  Follow-up: Return in about 3 months (around 04/04/2017) for follow up diabetes.   [name], MD

## 2017-01-02 NOTE — Patient Instructions (Addendum)
Drink all of your water earllier in the day:  by 7 pm  If going to bed at 10,  And by 6 pm if in bed by 9 pm   Tell Candy that even though gallstones  Were are present  On the CT done in June 2017,  They were  not causing symptoms ,  So Nothing is done  About them.  Only when they start to cause problems does surgery get done  Because surgery has risks    You are having one stent removed from your common bile duct in April by Dr Allen Norris.   Your diabetes is under excellent control currently. .Please keep checking your evenings blood sugars  And let me know if they are > 180 on a regular basis.  Otherwise return in 3 months for follow up on diabetes and make sure you are seeing your eye doctor at least once a year.

## 2017-01-02 NOTE — Progress Notes (Signed)
Pre visit review using our clinic review tool, if applicable. No additional management support is needed unless otherwise documented below in the visit note. 

## 2017-01-04 NOTE — Assessment & Plan Note (Signed)
Secondary to protracted gi symptoms surrounding his cholecysectomy.  His weight gain of 11 lbs is noted,  His appetite has improved.  Lab Results  Component Value Date   ALT 15 01/02/2017   AST 14 01/02/2017   ALKPHOS 71 01/02/2017   BILITOT 0.6 01/02/2017

## 2017-01-04 NOTE — Assessment & Plan Note (Signed)
He has asymptomatic restenosis, not critical of right sfa and sees  Dr dew every 6 months .  Feet are well perfused today

## 2017-01-04 NOTE — Assessment & Plan Note (Signed)
Finally controlled due to unintentional weight loss.  No changes today.  Diet reivewed   Lab Results  Component Value Date   HGBA1C 6.3 01/02/2017

## 2017-01-04 NOTE — Assessment & Plan Note (Signed)
Well controlled on current regimen. Renal function stable, no changes today.  Lab Results  Component Value Date   CREATININE 1.48 01/02/2017   Lab Results  Component Value Date   NA 135 01/02/2017   K 4.7 01/02/2017   CL 101 01/02/2017   CO2 26 01/02/2017

## 2017-01-04 NOTE — Assessment & Plan Note (Signed)
Occurred during  ERCP and cbd stent placement during previous admission for cholecystitis required  percutaneous drains x 2 , now both removed . Awaiting removal of CBD stent

## 2017-01-04 NOTE — Assessment & Plan Note (Signed)
s/p repeat steroid injection March 2018

## 2017-01-04 NOTE — Assessment & Plan Note (Signed)
resolved.   Lab Results  Component Value Date   NA 135 01/02/2017   K 4.7 01/02/2017   CL 101 01/02/2017   CO2 26 01/02/2017

## 2017-01-04 NOTE — Assessment & Plan Note (Signed)
s/p steroid injection March 7

## 2017-01-05 ENCOUNTER — Encounter: Payer: Self-pay | Admitting: Internal Medicine

## 2017-01-11 ENCOUNTER — Other Ambulatory Visit: Payer: Self-pay | Admitting: Internal Medicine

## 2017-01-20 NOTE — Telephone Encounter (Signed)
Mailed unread message to patient. thanks 

## 2017-02-04 ENCOUNTER — Ambulatory Visit: Payer: Medicare Other | Admitting: Anesthesiology

## 2017-02-04 ENCOUNTER — Encounter: Payer: Self-pay | Admitting: *Deleted

## 2017-02-04 ENCOUNTER — Telehealth: Payer: Self-pay | Admitting: Gastroenterology

## 2017-02-04 ENCOUNTER — Other Ambulatory Visit: Payer: Self-pay | Admitting: Internal Medicine

## 2017-02-04 ENCOUNTER — Encounter
Admission: RE | Disposition: A | Discharge status: Home / Self Care | Payer: Self-pay | Source: Ambulatory Visit | Attending: Gastroenterology

## 2017-02-04 ENCOUNTER — Ambulatory Visit
Admission: RE | Admit: 2017-02-04 | Discharge: 2017-02-04 | Disposition: A | Discharge status: Home / Self Care | Payer: Medicare Other | Source: Ambulatory Visit | Attending: Gastroenterology | Admitting: Gastroenterology

## 2017-02-04 DIAGNOSIS — Z8673 Personal history of transient ischemic attack (TIA), and cerebral infarction without residual deficits: Secondary | ICD-10-CM | POA: Diagnosis not present

## 2017-02-04 DIAGNOSIS — E785 Hyperlipidemia, unspecified: Secondary | ICD-10-CM | POA: Insufficient documentation

## 2017-02-04 DIAGNOSIS — N400 Enlarged prostate without lower urinary tract symptoms: Secondary | ICD-10-CM | POA: Diagnosis not present

## 2017-02-04 DIAGNOSIS — E1151 Type 2 diabetes mellitus with diabetic peripheral angiopathy without gangrene: Secondary | ICD-10-CM | POA: Diagnosis not present

## 2017-02-04 DIAGNOSIS — K219 Gastro-esophageal reflux disease without esophagitis: Secondary | ICD-10-CM | POA: Insufficient documentation

## 2017-02-04 DIAGNOSIS — Z9049 Acquired absence of other specified parts of digestive tract: Secondary | ICD-10-CM | POA: Insufficient documentation

## 2017-02-04 DIAGNOSIS — Z7982 Long term (current) use of aspirin: Secondary | ICD-10-CM | POA: Diagnosis not present

## 2017-02-04 DIAGNOSIS — Z794 Long term (current) use of insulin: Secondary | ICD-10-CM | POA: Diagnosis not present

## 2017-02-04 DIAGNOSIS — Z4659 Encounter for fitting and adjustment of other gastrointestinal appliance and device: Secondary | ICD-10-CM

## 2017-02-04 DIAGNOSIS — I2510 Atherosclerotic heart disease of native coronary artery without angina pectoris: Secondary | ICD-10-CM | POA: Insufficient documentation

## 2017-02-04 DIAGNOSIS — I10 Essential (primary) hypertension: Secondary | ICD-10-CM | POA: Insufficient documentation

## 2017-02-04 DIAGNOSIS — Z79899 Other long term (current) drug therapy: Secondary | ICD-10-CM | POA: Insufficient documentation

## 2017-02-04 DIAGNOSIS — Z87891 Personal history of nicotine dependence: Secondary | ICD-10-CM | POA: Diagnosis not present

## 2017-02-04 DIAGNOSIS — Z96652 Presence of left artificial knee joint: Secondary | ICD-10-CM | POA: Diagnosis not present

## 2017-02-04 DIAGNOSIS — I6523 Occlusion and stenosis of bilateral carotid arteries: Secondary | ICD-10-CM | POA: Diagnosis not present

## 2017-02-04 HISTORY — DX: Cerebral infarction, unspecified: I63.9

## 2017-02-04 HISTORY — PX: ERCP: SHX5425

## 2017-02-04 LAB — GLUCOSE, CAPILLARY: Glucose-Capillary: 169 mg/dL — ABNORMAL HIGH (ref 65–99)

## 2017-02-04 SURGERY — ERCP, WITH INTERVENTION IF INDICATED
Anesthesia: General

## 2017-02-04 MED ORDER — GLYCOPYRROLATE 0.2 MG/ML IJ SOLN
INTRAMUSCULAR | Status: AC
  Filled 2017-02-04: qty 1

## 2017-02-04 MED ORDER — GLYCOPYRROLATE 0.2 MG/ML IJ SOLN
INTRAMUSCULAR | Status: DC | PRN
  Administered 2017-02-04: 0.2 mg via INTRAVENOUS

## 2017-02-04 MED ORDER — LIDOCAINE HCL (PF) 2 % IJ SOLN
INTRAMUSCULAR | Status: AC
  Filled 2017-02-04: qty 2

## 2017-02-04 MED ORDER — SODIUM CHLORIDE 0.9 % IV SOLN
INTRAVENOUS | Status: DC
  Administered 2017-02-04: 11:00:00 via INTRAVENOUS

## 2017-02-04 MED ORDER — LIDOCAINE HCL (CARDIAC) 20 MG/ML IV SOLN
INTRAVENOUS | Status: DC | PRN
  Administered 2017-02-04: 80 mg via INTRAVENOUS

## 2017-02-04 MED ORDER — PROPOFOL 10 MG/ML IV BOLUS
INTRAVENOUS | Status: DC | PRN
  Administered 2017-02-04: 30 mg via INTRAVENOUS
  Administered 2017-02-04: 50 mg via INTRAVENOUS
  Administered 2017-02-04: 20 mg via INTRAVENOUS

## 2017-02-04 MED ORDER — PROPOFOL 500 MG/50ML IV EMUL
INTRAVENOUS | Status: DC | PRN
  Administered 2017-02-04: 150 ug/kg/min via INTRAVENOUS

## 2017-02-04 NOTE — Anesthesia Preprocedure Evaluation (Signed)
Anesthesia Evaluation  Patient identified by MRN, date of birth, ID band Patient awake    Reviewed: Allergy & Precautions, H&P , NPO status , Patient's Chart, lab work & pertinent test results, reviewed documented beta blocker date and time   Airway Mallampati: II   Neck ROM: full    Dental  (+) Poor Dentition   Pulmonary neg pulmonary ROS, former smoker,    Pulmonary exam normal        Cardiovascular hypertension, + CAD and + Peripheral Vascular Disease  negative cardio ROS Normal cardiovascular exam Rhythm:regular Rate:Normal     Neuro/Psych  Neuromuscular disease negative neurological ROS  negative psych ROS   GI/Hepatic negative GI ROS, Neg liver ROS, GERD  Medicated,  Endo/Other  negative endocrine ROSdiabetes  Renal/GU negative Renal ROS  negative genitourinary   Musculoskeletal negative musculoskeletal ROS (+)   Abdominal   Peds negative pediatric ROS (+)  Hematology negative hematology ROS (+)   Anesthesia Other Findings Past Medical History: No date: BPH (benign prostatic hyperplasia) No date: Cancer ([place]) 11/2010: Carotid artery stenosis     Comment: <50% bilaterally 11/2010: CVA (cerebral infarction)     Comment: r thalamic lacunar No date: Diabetes mellitus No date: GERD (gastroesophageal reflux disease) No date: Hyperlipidemia No date: Hypertension No date: Neuromuscular disorder ([place]) 06/2012: Peripheral vascular disease in diabetes mellit*     Comment:  95% occlusion s/p PTCA R SFA Dew Sept 2013 Past Surgical History: 11/13/2016: CHOLECYSTECTOMY N/A     Comment: Procedure: LAPAROSCOPIC CHOLECYSTECTOMY WITH               INTRAOPERATIVE CHOLANGIOGRAM;  Surgeon: Olean Ree, MD;  Location: [facility] ORS;  Service:               General;  Laterality: N/A; 11/17/2016: ERCP N/A     Comment: Procedure: ENDOSCOPIC RETROGRADE               CHOLANGIOPANCREATOGRAPHY (ERCP);  Surgeon: Irene Shipper, MD;  Location[facility] ENDOSCOPY;  Service:              Endoscopy;  Laterality: N/A; 10/06/2016: IR GENERIC HISTORICAL     Comment: IR PERC CHOLECYSTOSTOMY 10/06/2016 Aletta Edouard, MD [facility]-INTERV RAD 2014: JOINT REPLACEMENT Left     Comment: left knee No date: UPPER GASTROINTESTINAL ENDOSCOPY No date: WRIST SURGERY   Reproductive/Obstetrics negative OB ROS                             Anesthesia Physical Anesthesia Plan  ASA: III  Anesthesia Plan: General   Post-op Pain Management:    Induction:   Airway Management Planned:   Additional Equipment:   Intra-op Plan:   Post-operative Plan:   Informed Consent: I have reviewed the patients History and Physical, chart, labs and discussed the procedure including the risks, benefits and alternatives for the proposed anesthesia with the patient or authorized representative who has indicated his/her understanding and acceptance.   Dental Advisory Given  Plan Discussed with: CRNA  Anesthesia Plan Comments:         Anesthesia Quick Evaluation

## 2017-02-04 NOTE — Telephone Encounter (Signed)
02/04/17 [employer] website.. NO prior auth required for ERCP K83.8 Decision ID #: [id number]

## 2017-02-04 NOTE — Anesthesia Post-op Follow-up Note (Cosign Needed)
Anesthesia QCDR form completed.        

## 2017-02-04 NOTE — Transfer of Care (Signed)
Immediate Anesthesia Transfer of Care Note  Patient: Henry Lindsey  Procedure(s) Performed: Procedure(s): ENDOSCOPIC RETROGRADE CHOLANGIOPANCREATOGRAPHY (ERCP) Stent removal (N/A)  Patient Location: PACU  Anesthesia Type:General  Level of Consciousness: sedated and responds to stimulation  Airway & Oxygen Therapy: Patient Spontanous Breathing and Patient connected to nasal cannula oxygen  Post-op Assessment: Report given to RN and Post -op Vital signs reviewed and stable  Post vital signs: Reviewed and stable  Last Vitals:  Vitals:   02/04/17 1158 02/04/17 1200  BP: 132/77 132/77  Pulse: 83 80  Resp: 18 13  Temp: 36.2 C     Last Pain:  Vitals:   02/04/17 1158  TempSrc: Tympanic         Complications: No apparent anesthesia complications

## 2017-02-04 NOTE — H&P (Signed)
Lucilla Lame, MD [address] [place], [place] [address] Phone:[phone] Fax : [phone]  Primary Care Physician:  [name], MD Primary Gastroenterologist:  Dr. Allen Norris  Pre-Procedure History & Physical: HPI:  Henry Lindsey is a 83 y.o. male is here for an ERCP.   Past Medical History:  Diagnosis Date  . BPH (benign prostatic hyperplasia)   . Cancer ([place])   . Carotid artery stenosis 11/2010   <50% bilaterally  . CVA (cerebral infarction) 11/2010   r thalamic lacunar  . Diabetes mellitus   . GERD (gastroesophageal reflux disease)   . Hyperlipidemia   . Hypertension   . Neuromuscular disorder ([place])   . Peripheral vascular disease in diabetes mellitus ([place]) 06/2012    95% occlusion s/p PTCA R SFA Dew Sept 2013  . Stroke [facility])     Past Surgical History:  Procedure Laterality Date  . CHOLECYSTECTOMY N/A 11/13/2016   Procedure: LAPAROSCOPIC CHOLECYSTECTOMY WITH INTRAOPERATIVE CHOLANGIOGRAM;  Surgeon: Olean Ree, MD;  Location: [facility] ORS;  Service: General;  Laterality: N/A;  . ERCP N/A 11/17/2016   Procedure: ENDOSCOPIC RETROGRADE CHOLANGIOPANCREATOGRAPHY (ERCP);  Surgeon: Irene Shipper, MD;  Location: [facility] ENDOSCOPY;  Service: Endoscopy;  Laterality: N/A;  . IR GENERIC HISTORICAL  10/06/2016   IR PERC CHOLECYSTOSTOMY 10/06/2016 Aletta Edouard, MD [facility]INTER[facility] RAD  . JOINT REPLACEMENT Left 2014   left knee  . UPPER GASTROINTESTINAL ENDOSCOPY    . WRIST SURGERY      Prior to Admission medications   Medication Sig Start Date End Date Taking? Authorizing Provider  amLODipine (NORVASC) 10 MG tablet Take 1 tablet (10 mg total) by mouth daily. 11/11/16  Yes Minna Merritts, MD  aspirin 81 MG tablet Take 1 tablet (81 mg total) by mouth daily. 11/05/16  Yes Minna Merritts, MD  atorvastatin (LIPITOR) 40 MG tablet TAKE 1 TABLET BY MOUTH EVERY DAY 07/15/16  Yes [name], MD  Cholecalciferol (VITAMIN D3) 2000 units TABS Take 1 tablet by mouth daily.   Yes  Historical Provider, MD  famotidine (PEPCID) 20 MG tablet TAKE 1 TABLET(20 MG) BY MOUTH TWICE DAILY 12/11/16  Yes [name], MD  gabapentin (NEURONTIN) 400 MG capsule TAKE 1 CAPSULE(400 MG) BY MOUTH THREE TIMES DAILY 09/09/16  Yes [name], MD  glipiZIDE (GLUCOTROL) 10 MG tablet TAKE 1 TABLET(10 MG) BY MOUTH TWICE DAILY 03/27/16  Yes [name], MD  insulin lispro protamine-lispro (HUMALOG MIX 75/25) (75-25) 100 UNIT/ML SUSP injection Inject 10 units right before  breakfast and 6 units before dinner Patient taking differently: 6-8 Units 2 (two) times daily. Inject 6 qam and 8 qpm 05/03/16  Yes [name], MD  JANUVIA 100 MG tablet TAKE 1 TABLET(100 MG) BY MOUTH DAILY. NOTE DOSE INCREASE 01/13/17  Yes [name], MD  latanoprost (XALATAN) 0.005 % ophthalmic solution INSTILL 1 DROP INTO BOTH EYES QD 11/01/15  Yes Historical Provider, MD  metFORMIN (GLUCOPHAGE) 1000 MG tablet TAKE 1 TABLET BY MOUTH TWICE DAILY 07/26/16  Yes [name], MD  metoprolol succinate (TOPROL-XL) 100 MG 24 hr tablet Take 1 tablet (100 mg total) by mouth daily. Take with or immediately following a meal. 11/11/16  Yes Minna Merritts, MD  Multiple Vitamin (MULTIVITAMIN WITH MINERALS) TABS tablet Take 1 tablet by mouth daily.   Yes Historical Provider, MD  pantoprazole (PROTONIX) 40 MG tablet TAKE 1 TABLET(40 MG) BY MOUTH TWICE DAILY BEFORE A MEAL 12/11/16  Yes [name],  MD  tamsulosin (FLOMAX) 0.4 MG CAPS capsule Take 1 capsule (0.4 mg total) by mouth daily. 12/05/16  Yes [name], MD  traMADol (ULTRAM) 50 MG tablet Take 50 mg by mouth at bedtime.  11/24/13  Yes Historical Provider, MD  Alum & Mag Hydroxide-Simeth (MAGIC MOUTHWASH) SOLN Take 5 mLs by mouth 3 (three) times daily as needed for mouth pain.    Historical Provider, MD  amLODipine (NORVASC) 10 MG tablet TAKE 1 TABLET BY MOUTH EVERY DAY Patient not taking: Reported on 02/04/2017 12/11/16   [name], MD  Blood Glucose Monitoring Suppl  (ONE TOUCH ULTRA SYSTEM KIT) w/Device KIT 1 kit by Does not apply route once. Use DX code E11.59 10/18/15   [name], MD  cilostazol (PLETAL) 100 MG tablet TAKE 1 TABLET(100 MG) BY MOUTH TWICE DAILY 02/04/17   [name], MD  fenofibrate micronized (LOFIBRA) 134 MG capsule TAKE 1 CAPSULE BY MOUTH EVERY MORNING BEFORE BREAKFAST Patient taking differently: TAKE 1 CAPSULE tid 08/28/16   [name], MD  glipiZIDE (GLUCOTROL) 10 MG tablet TAKE 1 TABLET(10 MG) BY MOUTH TWICE DAILY 01/13/17   [name], MD  glucose blood test strip Use as instructed three times daily 05/03/16   [name], MD  HYDROcodone-acetaminophen (NORCO/VICODIN) 5-325 MG tablet TK 1 T PO BID PRN FOR KNEE PAIN 12/31/16   Historical Provider, MD  INS SYRINGE/NEEDLE 1CC/28G (B-D INSULIN SYRINGE 1CC/28G) 28G X 1/2" 1 ML MISC USE TO ADMINISTER INSULIN DAILY 02/13/16   [name], MD  nitroGLYCERIN (NITROSTAT) 0.4 MG SL tablet Place 1 tablet (0.4 mg total) under the tongue every 5 (five) minutes as needed for chest pain. MAXIMUM 3 TABLETS 10/04/16   [name], MD  [facility] DELICA LANCETS 40Z MISC Use three times daily to check blood sugar. 10/18/15   [name], MD    Allergies as of 12/02/2016  . (No Known Allergies)    Family History  Problem Relation Age of Onset  . Diabetes Mother     Social History   Social History  . Marital status: Widowed    Spouse name: Enid Derry, deceased  . Number of children: 2  . Years of education: 12   Occupational History  .  Retired   Social History Main Topics  . Smoking status: Former Smoker    Packs/day: 0.50    Years: 33.00    Types: Cigarettes    Start date: [birth year] 13[birth year] 1934    Quit date: 04/21/1968  . Smokeless tobacco: Never Used  . Alcohol use No  . Drug use: No  . Sexual activity: Yes    Partners: Female    Birth control/ protection: None   Other Topics Concern  . Not on file   Social History Narrative   Widowed in 2014; dating again     Review  of Systems: See HPI, otherwise negative ROS  Physical Exam: BP (!) 142/96   Pulse 79   Temp (!) 96.3 F (35.7 C) (Tympanic)   Resp 20   Ht _0  (1.803 m)   Wt 191 lb (86.6 kg)   SpO2 98%   BMI 26.64 kg/m  General:   Alert,  pleasant and cooperative in NAD Head:  Normocephalic and atraumatic. Neck:  Supple; no masses or thyromegaly. Lungs:  Clear throughout to auscultation.    Heart:  Regular rate and rhythm. Abdomen:  Soft, nontender and nondistended. Normal bowel sounds, without guarding, and without rebound.   Neurologic:  Alert and  oriented x4;  grossly normal neurologically.  Impression/Plan: [name] is here for an ERCP to be performed for stent removal  Risks, benefits, limitations, and alternatives regarding  ERCP have been reviewed with the patient.  Questions have been answered.  All parties agreeable.   Lucilla Lame, MD  02/04/2017, 11:52 AM

## 2017-02-04 NOTE — Anesthesia Procedure Notes (Signed)
Performed by: Hiroto Saltzman Pre-anesthesia Checklist: Patient identified, Emergency Drugs available, Suction available, Patient being monitored and Timeout performed Patient Re-evaluated:Patient Re-evaluated prior to inductionOxygen Delivery Method: Nasal cannula Preoxygenation: Pre-oxygenation with 100% oxygen Intubation Type: IV induction       

## 2017-02-04 NOTE — Anesthesia Postprocedure Evaluation (Signed)
Anesthesia Post Note  Patient: Henry Lindsey  Procedure(s) Performed: Procedure(s) (LRB): ENDOSCOPIC RETROGRADE CHOLANGIOPANCREATOGRAPHY (ERCP) Stent removal (N/A)  Patient location during evaluation: PACU Anesthesia Type: General Level of consciousness: awake Pain management: pain level controlled Vital Signs Assessment: post-procedure vital signs reviewed and stable Respiratory status: spontaneous breathing Cardiovascular status: blood pressure returned to baseline and stable Anesthetic complications: no     Last Vitals:  Vitals:   02/04/17 1200 02/04/17 1208  BP: 132/77 128/90  Pulse: 80 80  Resp: 13 16  Temp:      Last Pain:  Vitals:   02/04/17 1158  TempSrc: Tympanic                 VAN STAVEREN,Tomie Elko

## 2017-02-04 NOTE — Op Note (Signed)
[facility] Patient Name: Henry Lindsey Procedure Date: 02/04/2017 11:04 AM MRN: [id number] Account #: [id number] Date of Birth: 1933/02/24 Admit Type: Outpatient Age: 83 Room: [facility] ENDO ROOM 4 Gender: Male Note Status: Finalized Procedure:            ERCP Indications:          Biliary stent removal after bile duct leak Providers:            Lucilla Lame MD, MD Medicines:            Propofol per Anesthesia Complications:        No immediate complications. Procedure:            Pre-Anesthesia Assessment:                       - Prior to the procedure, a History and Physical was                        performed, and patient medications and allergies were                        reviewed. The patient's tolerance of previous                        anesthesia was also reviewed. The risks and benefits of                        the procedure and the sedation options and risks were                        discussed with the patient. All questions were                        answered, and informed consent was obtained. Prior                        Anticoagulants: The patient has taken no previous                        anticoagulant or antiplatelet agents. ASA Grade                        Assessment: II - A patient with mild systemic disease.                        After reviewing the risks and benefits, the patient was                        deemed in satisfactory condition to undergo the                        procedure.                       After obtaining informed consent, the scope was passed                        under direct vision. Throughout the procedure, the                        patient's  blood pressure, pulse, and oxygen saturations                        were monitored continuously. The Endoscope was                        introduced through the mouth, and used to inject                        contrast into and used to inject contrast into the  bile                        duct. The ERCP was accomplished without difficulty. The                        patient tolerated the procedure well. Findings:      A biliary stent was visible on the scout film. One plastic stent       originating in the biliary tree was emerging from the major papilla. One       stent was removed from the biliary tree using a snare. The bile duct was       deeply cannulated with the short-nosed traction sphincterotome. Contrast       was injected. I personally interpreted the bile duct images. There was       brisk flow of contrast through the ducts. Image quality was excellent.       Contrast extended to the hepatic ducts. The in the biliary system was       normal. To discover objects, the biliary tree was swept with a 15 mm       balloon starting at the bifurcation. Nothing was found. Impression:           - One stent from the biliary tree was seen in the major                        papilla.                       - One stent was removed from the biliary tree.                       - The biliary tree was swept and nothing was found.                       - No leak seen. Recommendation:       - Watch for pancreatitis, bleeding, perforation, and                        cholangitis. Procedure Code(s):    --- Professional ---                       [phone], Endoscopic retrograde cholangiopancreatography                        (ERCP); with removal of foreign body(s) or stent(s)                        from biliary/pancreatic duct(s)  74328, Endoscopic catheterization of the biliary ductal                        system, radiological supervision and interpretation Diagnosis Code(s):    --- Professional ---                       Z46.59, Encounter for fitting and adjustment of other                        gastrointestinal appliance and device CPT copyright 2016 American Medical Association. All rights reserved. The codes documented in this report  are preliminary and upon coder review may  be revised to meet current compliance requirements. Lucilla Lame MD, MD 02/04/2017 11:50:58 AM This report has been signed electronically. Number of Addenda: 0 Note Initiated On: 02/04/2017 11:04 AM      [facility]

## 2017-02-05 ENCOUNTER — Encounter: Payer: Self-pay | Admitting: Gastroenterology

## 2017-02-10 ENCOUNTER — Telehealth: Payer: Self-pay | Admitting: *Deleted

## 2017-02-10 NOTE — Telephone Encounter (Signed)
[facility] is requested  a update on form faxed over requesting a resent blood pressure and A1c.  Fax: [phone]  Contact: Neka at [employer] : [phone] Ext [phone]

## 2017-02-13 NOTE — Telephone Encounter (Signed)
Paperwork was received, filled out and faxed yesterday, 02/12/2017.

## 2017-02-17 ENCOUNTER — Other Ambulatory Visit: Payer: Self-pay | Admitting: Internal Medicine

## 2017-02-18 ENCOUNTER — Telehealth: Payer: Self-pay

## 2017-02-18 NOTE — Telephone Encounter (Signed)
Pt stated he needs to pick up his 3 month hydrocodone prescription.

## 2017-02-18 NOTE — Telephone Encounter (Signed)
I read your note regarding the need for a 3 month medication refill. Did he mention which medication, so that it can be addressed sooner than his appointment?  Depending on which medication it is will determine if I can refill without being seen by the PCP.

## 2017-02-18 NOTE — Telephone Encounter (Signed)
Thank you.  Will address with PCP.

## 2017-02-19 NOTE — Telephone Encounter (Signed)
Patient medication is filled for 3 months at a time as long as patient has followed guidelines for narcotic last fill was 12/31/16 for  60.[name]

## 2017-02-20 ENCOUNTER — Telehealth: Payer: Self-pay | Admitting: Gastroenterology

## 2017-02-20 NOTE — Telephone Encounter (Signed)
Patient would like to know if it's ok to start exercising. It's been about 2 weeks since his stent. Please call

## 2017-02-21 ENCOUNTER — Telehealth: Payer: Self-pay | Admitting: Internal Medicine

## 2017-02-21 ENCOUNTER — Ambulatory Visit (INDEPENDENT_AMBULATORY_CARE_PROVIDER_SITE_OTHER): Payer: Medicare Other

## 2017-02-21 VITALS — BP 118/64 | HR 81 | Temp 97.9°F | Resp 14 | Ht 69.0 in | Wt 191.8 lb

## 2017-02-21 DIAGNOSIS — Z1389 Encounter for screening for other disorder: Secondary | ICD-10-CM

## 2017-02-21 DIAGNOSIS — Z0000 Encounter for general adult medical examination without abnormal findings: Secondary | ICD-10-CM

## 2017-02-21 MED ORDER — HYDROCODONE-ACETAMINOPHEN 5-325 MG PO TABS: ORAL_TABLET | ORAL | 0 refills | Status: DC

## 2017-02-21 NOTE — Patient Instructions (Addendum)
  Mr. Henry Lindsey , Thank you for taking time to come for your Medicare Wellness Visit. I appreciate your ongoing commitment to your health goals. Please review the following plan we discussed and let me know if I can assist you in the future.   Follow up with Dr. Derrel Nip as needed.    Have a great day!  These are the goals we discussed: Goals    . Increase physical activity       This is a list of the screening recommended for you and due dates:  Health Maintenance  Topic Date Due  . Flu Shot  05/21/2017  . Eye exam for diabetics  06/28/2017  . Hemoglobin A1C  07/05/2017  . Complete foot exam   01/02/2018  . Tetanus Vaccine  04/05/2022  . Pneumonia vaccines  Completed

## 2017-02-21 NOTE — Telephone Encounter (Signed)
Yes , please print rx

## 2017-02-21 NOTE — Telephone Encounter (Signed)
Script printed , signed by PCP and placed at front desk.

## 2017-02-21 NOTE — Telephone Encounter (Signed)
Patient requesting refill on Hydrocodone last fill was 01/29/17 per pharmacist , patient last OV 01/02/17 which patient due follow up 04/02/17 no appointment scheduled . Patient coming into office for Medicare wellness ok to fill for one month and setup follow up with PCP?

## 2017-02-21 NOTE — Progress Notes (Signed)
Subjective:   Henry Lindsey is a 83 y.o. male who presents for Medicare Annual/Subsequent preventive examination.  Review of Systems:  No ROS.  Medicare Wellness Visit.  Cardiac Risk Factors include: advanced age (>12mn, >>79women);male gender;diabetes mellitus;hypertension     Objective:    Vitals: BP 118/64 (BP Location: Left Arm, Patient Position: Sitting, Cuff Size: Normal)   Pulse 81   Temp 97.9 F (36.6 C) (Oral)   Resp 14   Ht _0  (1.753 m)   Wt 191 lb 12.8 oz (87 kg)   SpO2 96%   BMI 28.32 kg/m   Body mass index is 28.32 kg/m.  Tobacco History  Smoking Status  . Former Smoker  . Packs/day: 0.50  . Years: 33.00  . Types: Cigarettes  . Start date: 7May 01, 1934 . Quit date: 04/21/1968  Smokeless Tobacco  . Never Used     Counseling given: Not Answered   Past Medical History:  Diagnosis Date  . BPH (benign prostatic hyperplasia)   . Cancer ([facility]   . Carotid artery stenosis 11/2010   <50% bilaterally  . CVA (cerebral infarction) 11/2010   r thalamic lacunar  . Diabetes mellitus   . GERD (gastroesophageal reflux disease)   . Hyperlipidemia   . Hypertension   . [place]romuscular [place] ([place]   . Peripheral vascular disease in diabetes mellitus ([facility] 06/2012    95% occlusion s/p PTCA R SFA Dew Sept 2013  . Stroke ([facility]    Past Surgical History:  Procedure Laterality Date  . CHOLECYSTECTOMY N/A 11/13/2016   Procedure: LAPAROSCOPIC CHOLECYSTECTOMY WITH INTRAOPERATIVE CHOLANGIOGRAM;  Surgeon: JOlean Ree MD;  Location: [facility] ORS;  Service: General;  Laterality: N/A;  . ERCP N/A 11/17/2016   Procedure: ENDOSCOPIC RETROGRADE CHOLANGIOPANCREATOGRAPHY (ERCP);  Surgeon: JIrene Shipper MD;  [facility];  Service: Endoscopy;  Laterality: N/A;  . ERCP N/A 02/04/2017   Procedure: ENDOSCOPIC RETROGRADE CHOLANGIOPANCREATOGRAPHY (ERCP) Stent removal;  Surgeon: DLucilla Lame MD;  Location: [facility] ENDOSCOPY;  Service: Endoscopy;  Laterality: N/A;  . IR GENERIC  HISTORICAL  10/06/2016   IR PERC CHOLECYSTOSTOMY 10/06/2016 GAletta Edouard MD [facility]-INTERV RAD  . JOINT REPLACEMENT Left 2014   left knee  . UPPER GASTROINTESTINAL ENDOSCOPY    . WRIST SURGERY     Family History  Problem Relation Age of Onset  . Diabetes Mother    History  Sexual Activity  . Sexual activity: No    Outpatient Encounter Prescriptions as of 02/21/2017  Medication Sig  . Alum & Mag Hydroxide-Simeth (MAGIC MOUTHWASH) SOLN Take 5 mLs by mouth 3 (three) times daily as needed for mouth pain.  .Marland KitchenamLODipine (NORVASC) 10 MG tablet Take 1 tablet (10 mg total) by mouth daily.  .Marland KitchenamLODipine (NORVASC) 10 MG tablet TAKE 1 TABLET BY MOUTH EVERY DAY  . aspirin 81 MG tablet Take 1 tablet (81 mg total) by mouth daily.  .Marland Kitchenatorvastatin (LIPITOR) 40 MG tablet TAKE 1 TABLET BY MOUTH EVERY DAY  . Blood Glucose Monitoring Suppl (ONE TOUCH ULTRA SYSTEM KIT) w/Device KIT 1 kit by Does not apply route once. Use DX code E11.59  . Cholecalciferol (VITAMIN D3) 2000 units TABS Take 1 tablet by mouth daily.  . cilostazol (PLETAL) 100 MG tablet TAKE 1 TABLET(100 MG) BY MOUTH TWICE DAILY  . famotidine (PEPCID) 20 MG tablet TAKE 1 TABLET(20 MG) BY MOUTH TWICE DAILY  . fenofibrate micronized (LOFIBRA) 134 MG capsule TAKE 1 CAPSULE BY MOUTH EVERY MORNING BEFORE BREAKFAST (Patient taking differently: TAKE 1  CAPSULE tid)  . gabapentin (NEURONTIN) 400 MG capsule TAKE 1 CAPSULE(400 MG) BY MOUTH THREE TIMES DAILY  . glipiZIDE (GLUCOTROL) 10 MG tablet TAKE 1 TABLET(10 MG) BY MOUTH TWICE DAILY  . glipiZIDE (GLUCOTROL) 10 MG tablet TAKE 1 TABLET(10 MG) BY MOUTH TWICE DAILY  . glucose blood test strip Use as instructed three times daily  . INS SYRINGE/NEEDLE 1CC/28G (B-D INSULIN SYRINGE 1CC/28G) 28G X 1/2" 1 ML MISC USE TO ADMINISTER INSULIN DAILY  . insulin lispro protamine-lispro (HUMALOG MIX 75/25) (75-25) 100 UNIT/ML SUSP injection Inject 10 units right before  breakfast and 6 units before dinner (Patient taking  differently: 6-8 Units 2 (two) times daily. Inject 6 qam and 8 qpm)  . JANUVIA 100 MG tablet TAKE 1 TABLET(100 MG) BY MOUTH DAILY. NOTE DOSE INCREASE  . latanoprost (XALATAN) 0.005 % ophthalmic solution INSTILL 1 DROP INTO BOTH EYES QD  . losartan-hydrochlorothiazide (HYZAAR) 100-25 MG tablet TAKE 1 TABLET BY MOUTH EVERY DAY  . metFORMIN (GLUCOPHAGE) 1000 MG tablet TAKE 1 TABLET BY MOUTH TWICE DAILY  . metoprolol succinate (TOPROL-XL) 100 MG 24 hr tablet Take 1 tablet (100 mg total) by mouth daily. Take with or immediately following a meal.  . Multiple Vitamin (MULTIVITAMIN WITH MINERALS) TABS tablet Take 1 tablet by mouth daily.  . nitroGLYCERIN (NITROSTAT) 0.4 MG SL tablet Place 1 tablet (0.4 mg total) under the tongue every 5 (five) minutes as needed for chest pain. MAXIMUM 3 TABLETS  . ONETOUCH DELICA LANCETS 99I MISC Use three times daily to check blood sugar.  . pantoprazole (PROTONIX) 40 MG tablet TAKE 1 TABLET(40 MG) BY MOUTH TWICE DAILY BEFORE A MEAL  . tamsulosin (FLOMAX) 0.4 MG CAPS capsule Take 1 capsule (0.4 mg total) by mouth daily.  . tamsulosin (FLOMAX) 0.4 MG CAPS capsule TAKE 1 CAPSULE BY MOUTH EVERY DAY  . traMADol (ULTRAM) 50 MG tablet Take 50 mg by mouth at bedtime.   . [DISCONTINUED] HYDROcodone-acetaminophen (NORCO/VICODIN) 5-325 MG tablet TK 1 T PO BID PRN FOR KNEE PAIN   No facility-administered encounter medications on file as of 02/21/2017.     Activities of Daily Living In your present state of health, do you have any difficulty performing the following activities: 02/21/2017 11/19/2016  Hearing? N -  Vision? N -  Difficulty concentrating or making decisions? N -  Walking or climbing stairs? Y -  Dressing or bathing? N -  Doing errands, shopping? N N  Preparing Food and eating ? N -  Using the Toilet? N -  In the past six months, have you accidently leaked urine? N -  Do you have problems with loss of bowel control? N -  Managing your Medications? N -  Managing  your Finances? N -  Housekeeping or managing your Housekeeping? N -  Some recent data might be hidden    Patient Care Team: [name], MD as PCP - General (Internal Medicine) Minna Merritts, MD as Consulting Physician (Cardiology)   Assessment:    This is a routine wellness examination for Antjuan. The goal of the wellness visit is to assist the patient how to close the gaps in care and create a preventative care plan for the patient.   Taking calcium VIT D as appropriate/Osteoporosis risk reviewed.  Medications reviewed; taking without issues or barriers.  Safety issues reviewed; smoke detectors in the home.Firearms locked up in the home. Wears seatbelts when driving or riding with others. Patient does wear sunscreen or protective clothing when in direct  sunlight. No violence in the home.  Depression- PHQ 2 &9 complete.  No signs/symptoms or verbal communication regarding little pleasure in doing things, feeling down, depressed or hopeless. No changes in sleeping, energy, eating, concentrating.  No thoughts of self harm or harm towards others.  Time spent on this topic is 10 minutes.   Patient is alert, normal appearance, oriented to person/place/and time. Correctly identified the president of the [place], recall of 3/3 words, and performing simple calculations.  Patient displays appropriate judgement and can read correct time from watch face.  No new identified risk were noted.  No failures at ADL's or IADL's.   BMI- discussed the importance of a healthy diet, water intake and exercise. Educational material provided.   Daily fluid intake: 1 cups of caffeine, 4 cups of water  HTN- followed by PCP.  Eye- Visual acuity not assessed per patient preference since they have regular follow up with the ophthalmologist.  Wears corrective lenses.  Sleep patterns- Sleeps 3-4 hours at night.  Wakes feeling rested.  Naps during the day  Health maintenance gaps- closed.  Patient  Concerns: Urgency frequency continued through the night.  He is no longer drinking beverages close to bedtime.  Currently taking Flomax 0.4 in the morning and plans to start taking it at night instead. Followed by Urologist, Dr. Rogers Blocker.  Will notify PCP of how he is doing at upcoming appointment.  Encouraged to contact the office sooner if needed.  Exercise Activities and Dietary recommendations Current Exercise Habits: The patient does not participate in regular exercise at present  Goals    . Increase physical activity      Fall Risk Fall Risk  02/21/2017 01/02/2017 12/04/2016 11/13/2015 10/07/2014  Falls in the past year? _0    Depression Screen PHQ 2/9 Scores 02/21/2017 01/02/2017 11/13/2015 10/07/2014  PHQ - 2 Score 0 0 0 0  PHQ- 9 Score 0 - - -    Cognitive Function MMSE - Mini Mental State Exam 02/21/2017  Orientation to time 5  Orientation to Place 5  Registration 3  Attention/ Calculation 2  Attention/Calculation-comments Difficulty performing simple calculations   Recall 3  Language- name 2 objects 2  Language- repeat 1  Language- follow 3 step command 3  Language- read & follow direction 1  Write a sentence 1  Copy design 1  Total score 27        Immunization History  Administered Date(s) Administered  . Influenza-Unspecified 07/21/2013, 08/22/2015, 08/26/2016  . Pneumococcal Conjugate-13 12/27/2013  . Pneumococcal Polysaccharide-23 01/02/2012  . Tdap 04/05/2012   Screening Tests Health Maintenance  Topic Date Due  . INFLUENZA VACCINE  05/21/2017  . OPHTHALMOLOGY EXAM  06/28/2017  . HEMOGLOBIN A1C  07/05/2017  . FOOT EXAM  01/02/2018  . TETANUS/TDAP  04/05/2022  . PNA vac Low Risk Adult  Completed      Plan:    End of life planning; Advance aging; Advanced directives discussed. Copy of current HCPOA/Living Will on file.    I have personally reviewed and noted the following in the patient's chart:   . Medical and social history . Use of  alcohol, tobacco or illicit drugs  . Current medications and supplements . Functional ability and status . Nutritional status . Physical activity . Advanced directives . List of other physicians . Hospitalizations, surgeries, and ER visits in previous 12 months . Vitals . Screenings to include cognitive, depression, and falls . Referrals and appointments  In addition, I have reviewed  and discussed with patient certain preventive protocols, quality metrics, and best practice recommendations. A written personalized care plan for preventive services as well as general preventive health recommendations were provided to patient.     Varney Biles, LPN  03/25/1685

## 2017-02-24 NOTE — Telephone Encounter (Signed)
Left vm letting pt know it was ok to restart exercising per Dr. Allen Norris.

## 2017-02-24 NOTE — Telephone Encounter (Signed)
Yes he can restart exercising

## 2017-02-25 ENCOUNTER — Telehealth: Payer: Self-pay

## 2017-02-25 NOTE — Telephone Encounter (Signed)
Corene Cornea from [employer] called to inform you that the pt is getting pain medications from both you and from Carlynn Spry, MD. He stated that the pt is getting tramadol from Dr. Ronnald Ramp and Hydrocodone from you. He wanted to make sure that you are aware of this and if not what you would like to do about it. Please advise.

## 2017-02-25 NOTE — Telephone Encounter (Signed)
Tell Henry Lindsey not to refill the r from [place]. Please find his controlled substance/narcotics contract  On file.  If it is signed,  Please let patient know that we will no longer be refilling hydrocodone because he has been getting refills on tramadol from Dr Ronnald Ramp , which is a controlled substance and therefore a violation of his contract.  If there is no contract on file,  This is OUR mistake and he will need to [facility] an appt to discuss.

## 2017-02-26 NOTE — Telephone Encounter (Signed)
LMTCB with pt. Spoke with Corene Cornea at [facility] to let him know that Dr. Derrel Nip will no longer be filling the pt's hydrocodone.

## 2017-02-27 ENCOUNTER — Other Ambulatory Visit
Admission: RE | Admit: 2017-02-27 | Discharge: 2017-02-27 | Disposition: A | Discharge status: Home / Self Care | Payer: Medicare Other | Source: Ambulatory Visit | Attending: Gastroenterology | Admitting: Gastroenterology

## 2017-02-27 ENCOUNTER — Other Ambulatory Visit: Payer: Self-pay

## 2017-02-27 ENCOUNTER — Telehealth: Payer: Self-pay | Admitting: Gastroenterology

## 2017-02-27 DIAGNOSIS — K838 Other specified diseases of biliary tract: Secondary | ICD-10-CM | POA: Insufficient documentation

## 2017-02-27 LAB — CBC WITH DIFFERENTIAL/PLATELET
Basophils Absolute: 0 10*3/uL (ref 0–0.1)
Basophils Relative: 1 %
EOS ABS: 0.2 10*3/uL (ref 0–0.7)
EOS PCT: 3 %
HCT: 35.2 % — ABNORMAL LOW (ref 40.0–52.0)
Hemoglobin: 11.6 g/dL — ABNORMAL LOW (ref 13.0–18.0)
LYMPHS ABS: 2 10*3/uL (ref 1.0–3.6)
LYMPHS PCT: 26 %
MCH: 25.6 pg — AB (ref 26.0–34.0)
MCHC: 33 g/dL (ref 32.0–36.0)
MCV: 77.7 fL — AB (ref 80.0–100.0)
MONO ABS: 0.7 10*3/uL (ref 0.2–1.0)
Monocytes Relative: 9 %
Neutro Abs: 4.8 10*3/uL (ref 1.4–6.5)
Neutrophils Relative %: 61 %
PLATELETS: 353 10*3/uL (ref 150–440)
RBC: 4.52 MIL/uL (ref 4.40–5.90)
RDW: 16.5 % — ABNORMAL HIGH (ref 11.5–14.5)
WBC: 7.8 10*3/uL (ref 3.8–10.6)

## 2017-02-27 LAB — HEPATIC FUNCTION PANEL
ALT: 23 U/L (ref 17–63)
AST: 25 U/L (ref 15–41)
Albumin: 4.3 g/dL (ref 3.5–5.0)
Alkaline Phosphatase: 65 U/L (ref 38–126)
TOTAL PROTEIN: 7.3 g/dL (ref 6.5–8.1)
Total Bilirubin: 0.7 mg/dL (ref 0.3–1.2)

## 2017-02-27 NOTE — Telephone Encounter (Signed)
Daughter called and is very concerned about her father please call Candice Baby at [phone]. She also states that he has been dizzy for three weeks.

## 2017-02-27 NOTE — Telephone Encounter (Signed)
Spoke with pt's daughter, Henry Lindsey and was advised per Dr. Allen Norris he would like a STAT CBC and LFT's today to see if bilirubin is elevated. Also, he doesn't feel the dizziness is coming from where the stent was removed.

## 2017-02-27 NOTE — Telephone Encounter (Signed)
Received STAT labs and notified pt's daughter labs were normal. Not sure at this point where the pain is coming from but liver is working fine.

## 2017-02-27 NOTE — Progress Notes (Signed)
  I have reviewed the above information and agree with above.   Welda Azzarello, MD 

## 2017-02-27 NOTE — Telephone Encounter (Signed)
Patient called and is having abdominal pain. He thinks he might have problems with his stent. He will feels weak. Please call patient.

## 2017-02-28 NOTE — Telephone Encounter (Signed)
Explained to the pt that he will no longer be getting hydrocodone from Dr. Derrel Nip because he is also getting tramadol from a different provider and they are both controlled substances and that is a violation of contract. Also explained to pt that I did not see a controlled substance contract signed in the his chart so he was told that he would need to schedule an appt to discuss this. Pt stated that he has an appt on 04/01/2017.

## 2017-03-03 ENCOUNTER — Telehealth: Payer: Self-pay | Admitting: Internal Medicine

## 2017-03-03 NOTE — Telephone Encounter (Signed)
Stop the amlodipine and losartan and resume the metoprolol.  Come in for a RN bp check sometimes this week

## 2017-03-03 NOTE — Telephone Encounter (Signed)
Pt called back to notify that taking his Flomax later in the day has helped with urinary frequency during the night.  During this call patient made me aware he has been dizzy almost every day since his gall bladder surgery.  He generally takes all medications as directed but as of today he is holding amlodipine, losartan and metoprolol.  He is concerned the doses are too much and would like to know if he can decrease or stop one of them due to the dizziness.  He has not monitored his BP at home lately.  Last OV BR reading 118/64.  OK to leave a message on his voice mail if no answer when responding.

## 2017-03-03 NOTE — Telephone Encounter (Signed)
Returned call.  No answer.  Left a message to return my call.

## 2017-03-03 NOTE — Telephone Encounter (Signed)
Verbalized understanding to resume on Metoprolol only.  RN bp check scheduled on 03/06/17 at 11:30.

## 2017-03-03 NOTE — Telephone Encounter (Signed)
Pt called back to give you the results of what you've suggested for pt to do. Thank you!  Call pt @ [phone].

## 2017-03-06 ENCOUNTER — Ambulatory Visit (INDEPENDENT_AMBULATORY_CARE_PROVIDER_SITE_OTHER): Payer: Medicare Other

## 2017-03-06 VITALS — BP 132/78 | HR 78 | Resp 20

## 2017-03-06 DIAGNOSIS — I10 Essential (primary) hypertension: Secondary | ICD-10-CM

## 2017-03-06 NOTE — Progress Notes (Signed)
Patient came in for a BP check.  On 5/14 telephone note requested change in BP medications: to stop amlodipine and losartan and to continue metoprolol only.   Patient has been checking BP at home and values have been approximately 130/120's over 60-80's.  States he take the metoprolol and the flomax at night and that he is doing much better.  See Vitals for details.

## 2017-03-10 ENCOUNTER — Other Ambulatory Visit: Payer: Self-pay

## 2017-03-10 MED ORDER — GABAPENTIN 400 MG PO CAPS: ORAL_CAPSULE | ORAL | 0 refills | Status: DC

## 2017-03-11 NOTE — Progress Notes (Signed)
  I have reviewed the above information and agree with above.   Cindee Mclester, MD 

## 2017-04-01 ENCOUNTER — Ambulatory Visit (INDEPENDENT_AMBULATORY_CARE_PROVIDER_SITE_OTHER): Payer: Medicare Other | Admitting: Internal Medicine

## 2017-04-01 ENCOUNTER — Encounter: Payer: Self-pay | Admitting: Internal Medicine

## 2017-04-01 VITALS — BP 140/68 | HR 71 | Temp 97.9°F | Resp 15 | Ht 69.0 in | Wt 198.2 lb

## 2017-04-01 DIAGNOSIS — M1712 Unilateral primary osteoarthritis, left knee: Secondary | ICD-10-CM

## 2017-04-01 DIAGNOSIS — E1142 Type 2 diabetes mellitus with diabetic polyneuropathy: Secondary | ICD-10-CM | POA: Diagnosis not present

## 2017-04-01 DIAGNOSIS — E782 Mixed hyperlipidemia: Secondary | ICD-10-CM

## 2017-04-01 DIAGNOSIS — E1151 Type 2 diabetes mellitus with diabetic peripheral angiopathy without gangrene: Secondary | ICD-10-CM | POA: Diagnosis not present

## 2017-04-01 DIAGNOSIS — R351 Nocturia: Secondary | ICD-10-CM | POA: Diagnosis not present

## 2017-04-01 DIAGNOSIS — R339 Retention of urine, unspecified: Secondary | ICD-10-CM

## 2017-04-01 MED ORDER — HYDROCODONE-ACETAMINOPHEN 5-325 MG PO TABS
1.0000 | ORAL_TABLET | Freq: Two times a day (BID) | ORAL | 0 refills | Status: DC | PRN
Start: 2017-04-01 — End: 2017-04-01

## 2017-04-01 MED ORDER — HYDROCODONE-ACETAMINOPHEN 5-325 MG PO TABS: 1.0000 | ORAL_TABLET | Freq: Two times a day (BID) | ORAL | 0 refills | Status: DC | PRN

## 2017-04-01 MED ORDER — GABAPENTIN 400 MG PO CAPS: ORAL_CAPSULE | ORAL | 1 refills | Status: DC

## 2017-04-01 MED ORDER — GLIPIZIDE 10 MG PO TABS: 10.0000 mg | ORAL_TABLET | Freq: Two times a day (BID) | ORAL | 4 refills | Status: DC

## 2017-04-01 NOTE — Patient Instructions (Addendum)
STOP EATING ICE CREAM EVERY NIGHT!   REDUCE TO 3 NIGHTS PER WEEK ;  You might want to try a premixed protein drink called Premier Protein shake for breakfast or late night snack . It is great tasting,   very low sugar and available of < $2 serving at [facility] and  In bulk for $1.50/serving at [employer] and [employer]  .    Nutritional analysis :  160 cal  30 g protein  1 g sugar 50% calcium needs   [employer] and [employer]  TAKING VICODIN TWICE DAILY IS THE MAXIMAL DOSE.  I HAVE GIVEN YOU REFILLS FOR 3 MONTHS.  SAFEGUARD YOUR PRESCRIPTIONS.  You will not receive duplicates if they are lost.  You must be seen every 3 months to receive refills  RETURN NEXT WEEK FOR FASTING [place] your GLIPIZIDE before you eat breakfast and dinner

## 2017-04-01 NOTE — Assessment & Plan Note (Signed)
Still severe despite prior knee replacement, right knee hurting as well.  Continue vicodin  Daily per narcotcis contract signed today.  Refill history confirmed via [place] Controlled Substance databas, accessed by me today.[name]

## 2017-04-01 NOTE — Assessment & Plan Note (Signed)
Previously  controlled due to unintentional weight loss.  No changes today.  Diet reivewed   Lab Results  Component Value Date   HGBA1C 6.3 01/02/2017

## 2017-04-01 NOTE — Assessment & Plan Note (Signed)
Managed witih gabapentin 400 mg tid.

## 2017-04-01 NOTE — Assessment & Plan Note (Signed)
MANAGED WITH TAMSULOSIN

## 2017-04-01 NOTE — Assessment & Plan Note (Signed)
Problematic at night only with nocturia x 4 .  Continue Flomax.  Not seeing Urology anymore.  Recommended against using TV advertised bladder agents

## 2017-04-01 NOTE — Progress Notes (Signed)
Subjective:  Patient ID: Henry Lindsey, male    DOB: 05/14/1933  Age: 83 y.o. MRN: [id number]  CC: The primary encounter diagnosis was Mixed hyperlipidemia. Diagnoses of Nocturia, Type 2 diabetes mellitus with diabetic peripheral angiopathy without gangrene, without long-term current use of insulin (HCC), Incomplete emptying of bladder, Primary osteoarthritis of left knee, and Diabetic polyneuropathy associated with type 2 diabetes mellitus ([place]) were also pertinent to this visit.  HPI Henry Lindsey presents for follow up on chronic issues including issues  including chronic cholecystitis with stent placement, chronic knee pain, and Diabetes type 2  Cholecystectomy.  Stent removal  done via ERCP in April by Lucilla Lame. Has  Gained 30 lbs since February . Overweight again .   DM type 2 :  He is checking blood sugars at home  Up to 140 fasting  nighttime 160 before bedtime.  Not taking glipizide before meals.    Chronic pain : patient WAS receiving tramadol from Carlynn Spry . LAST REFILL April 24  HE IS TAKING VICODIN TWICE DAILY FOR PERSISTENT KNEE PAIN   Taking 400 mg GABAPENTIN TID FOR NEUROPATHY  BLOOD PRESSURE ELEVATED TODAY    Outpatient Medications Prior to Visit  Medication Sig Dispense Refill  . Alum & Mag Hydroxide-Simeth (MAGIC MOUTHWASH) SOLN Take 5 mLs by mouth 3 (three) times daily as needed for mouth pain.    [name] aspirin 81 MG tablet Take 1 tablet (81 mg total) by mouth daily.    [name] atorvastatin (LIPITOR) 40 MG tablet TAKE 1 TABLET BY MOUTH EVERY DAY 90 tablet 2  . Blood Glucose Monitoring Suppl (ONE TOUCH ULTRA SYSTEM KIT) w/Device KIT 1 kit by Does not apply route once. Use DX code E11.59 1 each 0  . Cholecalciferol (VITAMIN D3) 2000 units TABS Take 1 tablet by mouth daily.    . cilostazol (PLETAL) 100 MG tablet TAKE 1 TABLET(100 MG) BY MOUTH TWICE DAILY 60 tablet 1  . famotidine (PEPCID) 20 MG tablet TAKE 1 TABLET(20 MG) BY MOUTH TWICE DAILY 60 tablet 5  . fenofibrate  micronized (LOFIBRA) 134 MG capsule TAKE 1 CAPSULE BY MOUTH EVERY MORNING BEFORE BREAKFAST (Patient taking differently: TAKE 1 CAPSULE tid) 30 capsule 0  . glucose blood test strip Use as instructed three times daily 100 each 12  . INS SYRINGE/NEEDLE 1CC/28G (B-D INSULIN SYRINGE 1CC/28G) 28G X 1/2" 1 ML MISC USE TO ADMINISTER INSULIN DAILY 100 each 0  . insulin lispro protamine-lispro (HUMALOG MIX 75/25) (75-25) 100 UNIT/ML SUSP injection Inject 10 units right before  breakfast and 6 units before dinner (Patient taking differently: 6-8 Units 2 (two) times daily. Inject 6 qam and 8 qpm) 10 mL 5  . JANUVIA 100 MG tablet TAKE 1 TABLET(100 MG) BY MOUTH DAILY. NOTE DOSE INCREASE 30 tablet 1  . latanoprost (XALATAN) 0.005 % ophthalmic solution INSTILL 1 DROP INTO BOTH EYES QD  5  . metFORMIN (GLUCOPHAGE) 1000 MG tablet TAKE 1 TABLET BY MOUTH TWICE DAILY 180 tablet 1  . metoprolol succinate (TOPROL-XL) 100 MG 24 hr tablet Take 1 tablet (100 mg total) by mouth daily. Take with or immediately following a meal. 90 tablet 3  . Multiple Vitamin (MULTIVITAMIN WITH MINERALS) TABS tablet Take 1 tablet by mouth daily.    . nitroGLYCERIN (NITROSTAT) 0.4 MG SL tablet Place 1 tablet (0.4 mg total) under the tongue every 5 (five) minutes as needed for chest pain. MAXIMUM 3 TABLETS 50 tablet 3  . ONETOUCH DELICA LANCETS 40J MISC Use  three times daily to check blood sugar. 100 each 11  . pantoprazole (PROTONIX) 40 MG tablet TAKE 1 TABLET(40 MG) BY MOUTH TWICE DAILY BEFORE A MEAL 60 tablet 5  . tamsulosin (FLOMAX) 0.4 MG CAPS capsule TAKE 1 CAPSULE BY MOUTH EVERY DAY 90 capsule 1  . gabapentin (NEURONTIN) 400 MG capsule TAKE 1 CAPSULE(400 MG) BY MOUTH THREE TIMES DAILY 90 capsule 0  . glipiZIDE (GLUCOTROL) 10 MG tablet TAKE 1 TABLET(10 MG) BY MOUTH TWICE DAILY 60 tablet 4  . HYDROcodone-acetaminophen (NORCO/VICODIN) 5-325 MG tablet TK 1 T PO BID PRN FOR KNEE PAIN 60 tablet 0  . traMADol (ULTRAM) 50 MG tablet Take 50 mg by  mouth at bedtime.     [name] amLODipine (NORVASC) 10 MG tablet Take 1 tablet (10 mg total) by mouth daily. (Patient not taking: Reported on 04/01/2017) 90 tablet 3  . amLODipine (NORVASC) 10 MG tablet TAKE 1 TABLET BY MOUTH EVERY DAY (Patient not taking: Reported on 04/01/2017) 30 tablet 5  . losartan-hydrochlorothiazide (HYZAAR) 100-25 MG tablet TAKE 1 TABLET BY MOUTH EVERY DAY (Patient not taking: Reported on 04/01/2017) 90 tablet 1   No facility-administered medications prior to visit.     Review of Systems;  Patient denies headache, fevers, malaise, unintentional weight loss, skin rash, eye pain, sinus congestion and sinus pain, sore throat, dysphagia,  hemoptysis , cough, dyspnea, wheezing, chest pain, palpitations, orthopnea, edema, abdominal pain, nausea, melena, diarrhea, constipation, flank pain, dysuria, hematuria, urinary  Frequency, nocturia, numbness, tingling, seizures,  Focal weakness, Loss of consciousness,  Tremor, insomnia, depression, anxiety, and suicidal ideation.      Objective:  BP 140/68 (BP Location: Left Arm, Patient Position: Sitting, Cuff Size: Normal)   Pulse 71   Temp 97.9 F (36.6 C) (Oral)   Resp 15   Ht '5\' 9"'  (1.753 m)   Wt 198 lb 3.2 oz (89.9 kg)   SpO2 94%   BMI 29.27 kg/m   BP Readings from Last 3 Encounters:  04/01/17 140/68  03/06/17 132/78  02/21/17 118/64    Wt Readings from Last 3 Encounters:  04/01/17 198 lb 3.2 oz (89.9 kg)  02/21/17 191 lb 12.8 oz (87 kg)  02/04/17 191 lb (86.6 kg)    General appearance: alert, cooperative and appears stated age Ears: normal TM's and external ear canals both ears Throat: lips, mucosa, and tongue normal; teeth and gums normal Neck: no adenopathy, no carotid bruit, supple, symmetrical, trachea midline and thyroid not enlarged, symmetric, no tenderness/mass/nodules Back: symmetric, no curvature. ROM normal. No CVA tenderness. Lungs: clear to auscultation bilaterally Heart: regular rate and rhythm, S1, S2  normal, no murmur, click, rub or gallop Abdomen: soft, non-tender; bowel sounds normal; no masses,  no organomegaly Pulses: 2+ and symmetric Skin: Skin color, texture, turgor normal. No rashes or lesions Lymph nodes: Cervical, supraclavicular, and axillary nodes normal.  Lab Results  Component Value Date   HGBA1C 6.3 01/02/2017   HGBA1C 7.6 (H) 10/05/2016   HGBA1C 7.9 (H) 10/04/2016    Lab Results  Component Value Date   CREATININE 1.48 01/02/2017   CREATININE 1.30 12/17/2016   CREATININE 1.25 12/05/2016    Lab Results  Component Value Date   WBC 7.8 02/27/2017   HGB 11.6 (L) 02/27/2017   HCT 35.2 (L) 02/27/2017   PLT 353 02/27/2017   GLUCOSE 84 01/02/2017   CHOL 128 01/02/2017   TRIG 108.0 01/02/2017   HDL 54.10 01/02/2017   LDLDIRECT 62.0 10/04/2016   LDLCALC 52 01/02/2017  ALT 23 02/27/2017   AST 25 02/27/2017   NA 135 01/02/2017   K 4.7 01/02/2017   CL 101 01/02/2017   CREATININE 1.48 01/02/2017   BUN 32 (H) 01/02/2017   CO2 26 01/02/2017   TSH 2.943 10/20/2016   PSA 0.83 10/10/2014   INR 1.22 11/21/2016   HGBA1C 6.3 01/02/2017   MICROALBUR 1.7 10/04/2016    No results found.  Assessment & Plan:   Problem List Items Addressed This Visit    T2DM (type 2 diabetes mellitus) ([place])    Previously  controlled due to unintentional weight loss.  No changes today.  Diet reivewed   Lab Results  Component Value Date   HGBA1C 6.3 01/02/2017         Relevant Medications   glipiZIDE (GLUCOTROL) 10 MG tablet   Other Relevant Orders   Hemoglobin A1c   Comprehensive metabolic panel   Microalbumin / creatinine urine ratio   Nocturia    MANAGED WITH TAMSULOSIN       Left knee DJD    Still severe despite prior knee replacement, right knee hurting as well.  Continue vicodin  Daily per narcotcis contract signed today.  Refill history confirmed via  Controlled Substance databas, accessed by me today..      Relevant Medications   HYDROcodone-acetaminophen  (NORCO/VICODIN) 5-325 MG tablet   Incomplete emptying of bladder    Problematic at night only with nocturia x 4 .  Continue Flomax.  Not seeing Urology anymore.  Recommended against using TV advertised bladder agents       Hyperlipidemia - Primary   Relevant Orders   Lipid panel   Diabetic neuropathy (HCC)    Managed witih gabapentin 400 mg tid.       Relevant Medications   glipiZIDE (GLUCOTROL) 10 MG tablet      I have discontinued Mr. Papadopoulos traMADol, amLODipine, amLODipine, and losartan-hydrochlorothiazide. I have also changed his glipiZIDE. Additionally, I am having him maintain his magic mouthwash, ONE TOUCH ULTRA SYSTEM KIT, ONETOUCH DELICA LANCETS 82X, latanoprost, INS SYRINGE/NEEDLE 1CC/28G, glucose blood, insulin lispro protamine-lispro, atorvastatin, metFORMIN, fenofibrate micronized, nitroGLYCERIN, multivitamin with minerals, aspirin, metoprolol succinate, Vitamin D3, famotidine, pantoprazole, cilostazol, tamsulosin, JANUVIA, gabapentin, and HYDROcodone-acetaminophen.  Meds ordered this encounter  Medications  . glipiZIDE (GLUCOTROL) 10 MG tablet    Sig: Take 1 tablet (10 mg total) by mouth 2 (two) times daily before a meal.    Dispense:  60 tablet    Refill:  4  . gabapentin (NEURONTIN) 400 MG capsule    Sig: TAKE 1 CAPSULE(400 MG) BY MOUTH THREE TIMES DAILY    Dispense:  270 capsule    Refill:  1  . DISCONTD: HYDROcodone-acetaminophen (NORCO/VICODIN) 5-325 MG tablet    Sig: Take 1 tablet by mouth 2 (two) times daily as needed for moderate pain (KNEE PAIN).    Dispense:  60 tablet    Refill:  0    May refill on or after 02/28/17  . DISCONTD: HYDROcodone-acetaminophen (NORCO/VICODIN) 5-325 MG tablet    Sig: Take 1 tablet by mouth 2 (two) times daily as needed for moderate pain (KNEE PAIN).    Dispense:  60 tablet    Refill:  0  . DISCONTD: HYDROcodone-acetaminophen (NORCO/VICODIN) 5-325 MG tablet    Sig: Take 1 tablet by mouth 2 (two) times daily as needed for  moderate pain (KNEE PAIN).    Dispense:  60 tablet    Refill:  0    May refill on or after May 01 2017  . HYDROcodone-acetaminophen (NORCO/VICODIN) 5-325 MG tablet    Sig: Take 1 tablet by mouth 2 (two) times daily as needed for moderate pain (KNEE PAIN).    Dispense:  60 tablet    Refill:  0    May refill on or after  June 01 2017   A total of 40 minutes of face to face time was spent with patient more than half of which was spent in counselling and coordination of care   Medications Discontinued During This Encounter  Medication Reason  . amLODipine (NORVASC) 10 MG tablet Duplicate  . losartan-hydrochlorothiazide (HYZAAR) 100-25 MG tablet Patient has not taken in last 30 days  . amLODipine (NORVASC) 10 MG tablet Patient has not taken in last 30 days  . traMADol (ULTRAM) 50 MG tablet   . glipiZIDE (GLUCOTROL) 10 MG tablet Reorder  . gabapentin (NEURONTIN) 400 MG capsule Reorder  . HYDROcodone-acetaminophen (NORCO/VICODIN) 5-325 MG tablet Reorder  . HYDROcodone-acetaminophen (NORCO/VICODIN) 5-325 MG tablet Reorder  . HYDROcodone-acetaminophen (NORCO/VICODIN) 5-325 MG tablet Reorder  . HYDROcodone-acetaminophen (NORCO/VICODIN) 5-325 MG tablet Reorder    Follow-up: Return in about 3 months (around 07/02/2017) for follow up diabetes, NARCOTICS REFILLS.   [name], MD

## 2017-04-07 ENCOUNTER — Other Ambulatory Visit: Payer: Self-pay | Admitting: Internal Medicine

## 2017-04-10 ENCOUNTER — Other Ambulatory Visit (INDEPENDENT_AMBULATORY_CARE_PROVIDER_SITE_OTHER): Payer: Medicare Other

## 2017-04-10 DIAGNOSIS — E782 Mixed hyperlipidemia: Secondary | ICD-10-CM

## 2017-04-10 DIAGNOSIS — E1151 Type 2 diabetes mellitus with diabetic peripheral angiopathy without gangrene: Secondary | ICD-10-CM | POA: Diagnosis not present

## 2017-04-10 LAB — LIPID PANEL
CHOL/HDL RATIO: 3
Cholesterol: 121 mg/dL (ref 0–200)
HDL: 34.7 mg/dL — ABNORMAL LOW (ref >39.00)
LDL CALC: 61 mg/dL (ref 0–99)
NONHDL: 86.38
Triglycerides: 125 mg/dL (ref 0.0–149.0)
VLDL: 25 mg/dL (ref 0.0–40.0)

## 2017-04-10 LAB — MICROALBUMIN / CREATININE URINE RATIO
CREATININE, U: 110.4 mg/dL
MICROALB UR: 1.3 mg/dL (ref 0.0–1.9)
Microalb Creat Ratio: 1.2 mg/g (ref 0.0–30.0)

## 2017-04-10 LAB — COMPREHENSIVE METABOLIC PANEL
ALT: 15 U/L (ref 0–53)
AST: 16 U/L (ref 0–37)
Albumin: 4.2 g/dL (ref 3.5–5.2)
Alkaline Phosphatase: 102 U/L (ref 39–117)
BUN: 14 mg/dL (ref 6–23)
CHLORIDE: 104 meq/L (ref 96–112)
CO2: 27 meq/L (ref 19–32)
Calcium: 9.4 mg/dL (ref 8.4–10.5)
Creatinine, Ser: 1.18 mg/dL (ref 0.40–1.50)
GFR: 62.82 mL/min (ref >60.00)
GLUCOSE: 192 mg/dL — AB (ref 70–99)
Potassium: 4.8 mEq/L (ref 3.5–5.1)
SODIUM: 137 meq/L (ref 135–145)
Total Bilirubin: 0.6 mg/dL (ref 0.2–1.2)
Total Protein: 6.8 g/dL (ref 6.0–8.3)

## 2017-04-10 LAB — HEMOGLOBIN A1C: HEMOGLOBIN A1C: 8.3 % — AB (ref 4.6–6.5)

## 2017-04-12 ENCOUNTER — Telehealth: Payer: Self-pay | Admitting: Internal Medicine

## 2017-04-12 ENCOUNTER — Encounter: Payer: Self-pay | Admitting: Internal Medicine

## 2017-04-12 NOTE — Telephone Encounter (Signed)
   His diabetes is under poor control on current regimen,   As evidenced by the jump in his a1c to 8.3  Please increase your twice daily insulin by 4 units on each end  and continue to  Check blood sugars twice daily,  Fasting and before dinner.  Make an appointment in 2-3 weeks and bring your log of blood sugars with you to your appt .

## 2017-04-14 NOTE — Telephone Encounter (Signed)
Patient scheduled , is patient aware?

## 2017-04-14 NOTE — Telephone Encounter (Signed)
Can you schedule pt for Monday July 16th at 4:30pm per Dr. Derrel Nip? Thank you!

## 2017-04-14 NOTE — Telephone Encounter (Signed)
Yes,.  And follow up in 2-3 weeks BRING SUGAR LOG

## 2017-04-14 NOTE — Telephone Encounter (Signed)
Yes pt is aware of appt date and time.

## 2017-04-14 NOTE — Telephone Encounter (Signed)
Spoke with pt and he stated that he had a cortisone injection in both his right knee and right hip before he had his lab work done. Would you still like for the pt to increase his insulin as stated below?

## 2017-04-16 ENCOUNTER — Other Ambulatory Visit: Payer: Self-pay | Admitting: Internal Medicine

## 2017-04-28 ENCOUNTER — Other Ambulatory Visit: Payer: Self-pay | Admitting: Internal Medicine

## 2017-04-28 NOTE — Telephone Encounter (Signed)
Mailed unread message to patient. thanks 

## 2017-05-01 ENCOUNTER — Other Ambulatory Visit: Payer: Self-pay | Admitting: Internal Medicine

## 2017-05-05 ENCOUNTER — Telehealth: Payer: Self-pay | Admitting: Internal Medicine

## 2017-05-05 ENCOUNTER — Ambulatory Visit: Payer: Medicare Other | Admitting: Internal Medicine

## 2017-05-05 NOTE — Telephone Encounter (Signed)
Pt dropped off blood sugar readings. Readings are up front in Dr. Lupita Dawn color folder.

## 2017-05-05 NOTE — Telephone Encounter (Signed)
Readings placed in folder to be reviewed

## 2017-05-08 ENCOUNTER — Other Ambulatory Visit (INDEPENDENT_AMBULATORY_CARE_PROVIDER_SITE_OTHER): Payer: Self-pay | Admitting: Vascular Surgery

## 2017-05-08 DIAGNOSIS — I739 Peripheral vascular disease, unspecified: Secondary | ICD-10-CM

## 2017-05-08 DIAGNOSIS — I779 Disorder of arteries and arterioles, unspecified: Secondary | ICD-10-CM

## 2017-05-09 ENCOUNTER — Ambulatory Visit (INDEPENDENT_AMBULATORY_CARE_PROVIDER_SITE_OTHER): Payer: Medicare Other | Admitting: Vascular Surgery

## 2017-05-09 ENCOUNTER — Ambulatory Visit (INDEPENDENT_AMBULATORY_CARE_PROVIDER_SITE_OTHER): Payer: Medicare Other

## 2017-05-09 ENCOUNTER — Encounter (INDEPENDENT_AMBULATORY_CARE_PROVIDER_SITE_OTHER): Payer: Self-pay

## 2017-05-09 DIAGNOSIS — I739 Peripheral vascular disease, unspecified: Principal | ICD-10-CM

## 2017-05-09 DIAGNOSIS — I779 Disorder of arteries and arterioles, unspecified: Secondary | ICD-10-CM | POA: Diagnosis not present

## 2017-05-09 NOTE — Telephone Encounter (Signed)
No I have not seen these and Garlon Hatchet is on vacation.

## 2017-05-09 NOTE — Telephone Encounter (Signed)
WHERE ARE THE READINGS?  I have not recevied them in any red or yellow folder

## 2017-05-12 NOTE — Telephone Encounter (Signed)
Since most of the readings he sent were BEFORE he read the email and increased his insulin doses by 4 units each,  I would like him to continue checking it twice daily and send me his readings in  2weeks

## 2017-05-12 NOTE — Telephone Encounter (Signed)
Spoke with pt and he stated that he has increased his insulin by 4 units twice daily.

## 2017-05-12 NOTE — Telephone Encounter (Signed)
Found it in the green folder.    Sugars are elevated  Please clarify if he hda increasd the insulin by 4 units as directed on the e mail that he had not read.

## 2017-05-12 NOTE — Telephone Encounter (Signed)
LMTCB

## 2017-05-13 NOTE — Telephone Encounter (Signed)
Spoke with pt and informed him of Dr. Tullo's message below. The pt gave a verbal understanding.  

## 2017-05-15 ENCOUNTER — Ambulatory Visit: Payer: Medicare Other | Admitting: Internal Medicine

## 2017-05-15 ENCOUNTER — Other Ambulatory Visit: Payer: Self-pay | Admitting: Internal Medicine

## 2017-05-16 ENCOUNTER — Encounter (INDEPENDENT_AMBULATORY_CARE_PROVIDER_SITE_OTHER): Payer: Self-pay | Admitting: Vascular Surgery

## 2017-06-16 ENCOUNTER — Telehealth: Payer: Self-pay | Admitting: Gastroenterology

## 2017-06-16 NOTE — Telephone Encounter (Signed)
Patient left a voice message that he has been having diarrhea 4xs daily. Whenever he eats he goes to the bathroom.  What should he do? He is seeing Dr. Derrel Nip 9/12. Please call

## 2017-06-17 NOTE — Telephone Encounter (Signed)
Pt has been scheduled for an appt with Dr. Allen Norris on 07/01/17.

## 2017-06-30 ENCOUNTER — Other Ambulatory Visit: Payer: Self-pay | Admitting: Internal Medicine

## 2017-07-01 ENCOUNTER — Ambulatory Visit (INDEPENDENT_AMBULATORY_CARE_PROVIDER_SITE_OTHER): Payer: Medicare Other | Admitting: Gastroenterology

## 2017-07-01 ENCOUNTER — Encounter: Payer: Self-pay | Admitting: Gastroenterology

## 2017-07-01 VITALS — BP 171/72 | HR 62 | Temp 97.8°F | Ht 69.0 in | Wt 195.4 lb

## 2017-07-01 DIAGNOSIS — R197 Diarrhea, unspecified: Secondary | ICD-10-CM

## 2017-07-01 LAB — HM DIABETES EYE EXAM

## 2017-07-01 NOTE — Progress Notes (Signed)
Primary Care Physician: [name], MD  Primary Gastroenterologist:  Dr. Lucilla Lame  Chief Complaint  Patient presents with  . Diarrhea    HPI: Henry Lindsey is a 83 y.o. male here for diarrhea.  The patient states he had a few weeks of diarrhea that he attributed to the change in his insulin.  The patient was last seen by me for a ERCP with stent removal after having a bile duct leak. He reports that after his insulin was changed his diarrhea completely resolved.  He is now having one formed bowel movement per day.  Current Outpatient Prescriptions  Medication Sig Dispense Refill  . Alum & Mag Hydroxide-Simeth (MAGIC MOUTHWASH) SOLN Take 5 mLs by mouth 3 (three) times daily as needed for mouth pain.    [name] aspirin 81 MG tablet Take 1 tablet (81 mg total) by mouth daily.    [name] atorvastatin (LIPITOR) 40 MG tablet TAKE 1 TABLET BY MOUTH EVERY DAY 90 tablet 2  . atorvastatin (LIPITOR) 40 MG tablet TAKE 1 TABLET BY MOUTH EVERY DAY 90 tablet 0  . Blood Glucose Monitoring Suppl (ONE TOUCH ULTRA SYSTEM KIT) w/Device KIT 1 kit by Does not apply route once. Use DX code E11.59 1 each 0  . Cholecalciferol (VITAMIN D3) 2000 units TABS Take 1 tablet by mouth daily.    . cilostazol (PLETAL) 100 MG tablet TAKE 1 TABLET(100 MG) BY MOUTH TWICE DAILY 60 tablet 0  . famotidine (PEPCID) 20 MG tablet TAKE 1 TABLET(20 MG) BY MOUTH TWICE DAILY 60 tablet 5  . fenofibrate micronized (LOFIBRA) 134 MG capsule TAKE 1 CAPSULE BY MOUTH EVERY MORNING BEFORE BREAKFAST (Patient taking differently: TAKE 1 CAPSULE tid) 30 capsule 0  . gabapentin (NEURONTIN) 400 MG capsule TAKE 1 CAPSULE(400 MG) BY MOUTH THREE TIMES DAILY 270 capsule 1  . glipiZIDE (GLUCOTROL) 10 MG tablet Take 1 tablet (10 mg total) by mouth 2 (two) times daily before a meal. 60 tablet 4  . glipiZIDE (GLUCOTROL) 10 MG tablet TAKE 1 TABLET(10 MG) BY MOUTH TWICE DAILY 60 tablet 0  . glucose blood test strip Use as instructed three times daily 100 each  12  . HYDROcodone-acetaminophen (NORCO/VICODIN) 5-325 MG tablet Take 1 tablet by mouth 2 (two) times daily as needed for moderate pain (KNEE PAIN). 60 tablet 0  . INS SYRINGE/NEEDLE 1CC/28G (B-D INSULIN SYRINGE 1CC/28G) 28G X 1/2" 1 ML MISC USE TO ADMINISTER INSULIN DAILY 100 each 0  . insulin lispro protamine-lispro (HUMALOG MIX 75/25) (75-25) 100 UNIT/ML SUSP injection Inject 10 units right before  breakfast and 6 units before dinner (Patient taking differently: 6-8 Units 2 (two) times daily. Inject 6 qam and 8 qpm) 10 mL 5  . latanoprost (XALATAN) 0.005 % ophthalmic solution INSTILL 1 DROP INTO BOTH EYES QD  5  . metFORMIN (GLUCOPHAGE) 1000 MG tablet TAKE 1 TABLET BY MOUTH TWICE DAILY 180 tablet 1  . metFORMIN (GLUCOPHAGE) 1000 MG tablet TAKE 1 TABLET BY MOUTH TWICE DAILY 180 tablet 0  . metoprolol succinate (TOPROL-XL) 100 MG 24 hr tablet Take 1 tablet (100 mg total) by mouth daily. Take with or immediately following a meal. 90 tablet 3  . Multiple Vitamin (MULTIVITAMIN WITH MINERALS) TABS tablet Take 1 tablet by mouth daily.    . nitroGLYCERIN (NITROSTAT) 0.4 MG SL tablet Place 1 tablet (0.4 mg total) under the tongue every 5 (five) minutes as needed for chest pain. MAXIMUM 3 TABLETS 50 tablet 3  . ONETOUCH DELICA LANCETS  33G MISC Use three times daily to check blood sugar. 100 each 11  . pantoprazole (PROTONIX) 40 MG tablet TAKE 1 TABLET(40 MG) BY MOUTH TWICE DAILY BEFORE A MEAL 60 tablet 0  . tamsulosin (FLOMAX) 0.4 MG CAPS capsule TAKE 1 CAPSULE BY MOUTH EVERY DAY 90 capsule 1  . HUMULIN N 100 UNIT/ML injection INJECT 15 UNITS UNDER THE SKIN AT BEDTIME (Patient not taking: Reported on 07/01/2017) 10 mL 0  . JANUVIA 100 MG tablet TAKE 1 TABLET(100 MG) BY MOUTH DAILY. NOTE DOSE INCREASE (Patient not taking: Reported on 07/01/2017) 30 tablet 0   No current facility-administered medications for this visit.     Allergies as of 07/01/2017  . (No Known Allergies)    ROS:  General: Negative  for anorexia, weight loss, fever, chills, fatigue, weakness. ENT: Negative for hoarseness, difficulty swallowing , nasal congestion. CV: Negative for chest pain, angina, palpitations, dyspnea on exertion, peripheral edema.  Respiratory: Negative for dyspnea at rest, dyspnea on exertion, cough, sputum, wheezing.  GI: See history of present illness. GU:  Negative for dysuria, hematuria, urinary incontinence, urinary frequency, nocturnal urination.  Endo: Negative for unusual weight change.    Physical Examination:   BP (!) 171/72   Pulse 62   Temp 97.8 F (36.6 C) (Oral)   Ht _0  (1.753 m)   Wt 195 lb 6.4 oz (88.6 kg)   BMI 28.86 kg/m   General: Well-nourished, well-developed in no acute distress.  Eyes: No icterus. Conjunctivae pink. Mouth: Oropharyngeal mucosa moist and pink , no lesions erythema or exudate. Lungs: Clear to auscultation bilaterally. Non-labored. Heart: Regular rate and rhythm, no murmurs rubs or gallops.  Abdomen: Bowel sounds are normal, nontender, nondistended, no hepatosplenomegaly or masses, no abdominal bruits or hernia , no rebound or guarding.   Extremities: No lower extremity edema. No clubbing or deformities. Neuro: Alert and oriented x 3.  Grossly intact. Skin: Warm and dry, no jaundice.   Psych: Alert and cooperative, normal mood and affect.  Labs:    Imaging Studies: No results found.  Assessment and Plan:   Henry Lindsey is a 83 y.o. y/o male who is having diarrhea that he believes was related to his insulin.  The diarrhea has since resolved.  The patient has been told that if the diarrhea returns he should consider having a colonoscopy to rule out microscopic colitis which can come and go.  The patient states he understands the plan and will call the office to be set up for colonoscopy if his diarrhea returns.    Lucilla Lame, MD. Marval Regal   Note: This dictation was prepared with Dragon dictation along with smaller phrase technology. Any  transcriptional errors that result from this process are unintentional.

## 2017-07-02 ENCOUNTER — Other Ambulatory Visit: Payer: Self-pay | Admitting: Internal Medicine

## 2017-07-02 ENCOUNTER — Ambulatory Visit (INDEPENDENT_AMBULATORY_CARE_PROVIDER_SITE_OTHER): Payer: Medicare Other | Admitting: Internal Medicine

## 2017-07-02 ENCOUNTER — Encounter: Payer: Self-pay | Admitting: Internal Medicine

## 2017-07-02 VITALS — BP 150/78 | HR 62 | Temp 98.0°F | Resp 15 | Ht 69.0 in | Wt 196.8 lb

## 2017-07-02 DIAGNOSIS — E1151 Type 2 diabetes mellitus with diabetic peripheral angiopathy without gangrene: Secondary | ICD-10-CM | POA: Diagnosis not present

## 2017-07-02 DIAGNOSIS — M1712 Unilateral primary osteoarthritis, left knee: Secondary | ICD-10-CM

## 2017-07-02 DIAGNOSIS — E782 Mixed hyperlipidemia: Secondary | ICD-10-CM

## 2017-07-02 DIAGNOSIS — I10 Essential (primary) hypertension: Secondary | ICD-10-CM | POA: Diagnosis not present

## 2017-07-02 MED ORDER — HYDROCODONE-ACETAMINOPHEN 5-325 MG PO TABS: 1.0000 | ORAL_TABLET | Freq: Two times a day (BID) | ORAL | 0 refills | Status: DC | PRN

## 2017-07-02 NOTE — Patient Instructions (Addendum)
Your morning sugars are too high. Increase your evening dose of insulin to 15 units,  If your morning sugar is not < 140 consistenlty by the end of one week,  Increase NPH to 20 units at bedtime   Stop the Januvia since it is too $$$$  For your daytime sugars  The morning insulin dose should be 10 units   Don't skip lunch  Eat at least a protein bar if you can't have a sit donw meal    There are plenty of high protein low carb bars,    Look for them in the diet section in front of the pharmacy at [facility]  where the protein shakes are  Sold.   All of these have 5 g sugar or less : Power crunch Atkins bars: best deals  KIND :thE  "low glycemic index"  Variety (cookeieaisle)  QUEST : (taste better after being microwaved OUT OF THE WRAPPER)    Return for labs in mid October

## 2017-07-02 NOTE — Progress Notes (Signed)
Subjective:  Patient ID: Henry Lindsey, male    DOB: 04-02-1933  Age: 83 y.o. MRN: [id number]  CC: The primary encounter diagnosis was Mixed hyperlipidemia. Diagnoses of Essential hypertension, Type 2 diabetes mellitus with diabetic peripheral angiopathy without gangrene, without long-term current use of insulin ([place]), Peripheral vascular disease in diabetes mellitus ([place]), and Primary osteoarthritis of left knee were also pertinent to this visit.  HPI [name] presents for 3 month follow up on diabetes.  Patient has no complaints today other than the cost of Januvia jumping to > $100/month .  Patient is following a low glycemic index diet and taking all prescribed medications regularly without side effects.   He is taking 12 units of NPH at bedtime and 8 units in the morning.    Fasting sugars have been over 140 most of the time and post prandials have been under 160 except on rare occasions.He has occasional hypoglycemic events in the afternoon caused by skipping lunch.  Patient is exercising about 3 times per week and intentionally trying to lose weight .  Patient has had an eye exam in the last 12 months and checks feet regularly for signs of infection.  Patient does not walk barefoot outside.  He has PAD but denies claudication symptoms.  He has chronic neuopathy and describes burning in feet managed with gabapentin . [name] Patient is up to date on all recommended vaccinations.  Chronic knee pain managed with two vicodin daily.  he has not had any ER visits  And has not requested any early refills.  His Refill history was confirmed via [place] Controlled Substance database by me today during his visit and there have been no prescriptions of controlled substances filled from any providers other than me. .    Lab Results  Component Value Date   HGBA1C 8.3 (H) 04/10/2017    History of recent cholecystectomy Had a prolonged episode of diarrhea lasting a total of 8  weeks in July/August .  After 4  weeks, He contacted Dr Allen Norris , who advised him to take immodium  Tid and probiotics Emmaline Kluver) and symptoms finally resolved after 4 weeks . No diarrhea for the past week. . Has lost 3 lbs,  Appetite good but "I can't eat anything I like" bc of his diabetes     Outpatient Medications Prior to Visit  Medication Sig Dispense Refill  . Alum & Mag Hydroxide-Simeth (MAGIC MOUTHWASH) SOLN Take 5 mLs by mouth 3 (three) times daily as needed for mouth pain.    [name] aspirin 81 MG tablet Take 1 tablet (81 mg total) by mouth daily.    [name] atorvastatin (LIPITOR) 40 MG tablet TAKE 1 TABLET BY MOUTH EVERY DAY 90 tablet 0  . Blood Glucose Monitoring Suppl (ONE TOUCH ULTRA SYSTEM KIT) w/Device KIT 1 kit by Does not apply route once. Use DX code E11.59 1 each 0  . Cholecalciferol (VITAMIN D3) 2000 units TABS Take 1 tablet by mouth daily.    . cilostazol (PLETAL) 100 MG tablet TAKE 1 TABLET(100 MG) BY MOUTH TWICE DAILY 60 tablet 0  . famotidine (PEPCID) 20 MG tablet TAKE 1 TABLET(20 MG) BY MOUTH TWICE DAILY 60 tablet 5  . fenofibrate micronized (LOFIBRA) 134 MG capsule TAKE 1 CAPSULE BY MOUTH EVERY MORNING BEFORE BREAKFAST (Patient taking differently: TAKE 1 CAPSULE tid) 30 capsule 0  . gabapentin (NEURONTIN) 400 MG capsule TAKE 1 CAPSULE(400 MG) BY MOUTH THREE TIMES DAILY 270 capsule 1  . glipiZIDE (GLUCOTROL) 10 MG  tablet Take 1 tablet (10 mg total) by mouth 2 (two) times daily before a meal. 60 tablet 4  . INS SYRINGE/NEEDLE 1CC/28G (B-D INSULIN SYRINGE 1CC/28G) 28G X 1/2" 1 ML MISC USE TO ADMINISTER INSULIN DAILY 100 each 0  . insulin lispro protamine-lispro (HUMALOG MIX 75/25) (75-25) 100 UNIT/ML SUSP injection Inject 10 units right before  breakfast and 6 units before dinner (Patient taking differently: 6-8 Units 2 (two) times daily. Inject 6 qam and 8 qpm) 10 mL 5  . latanoprost (XALATAN) 0.005 % ophthalmic solution INSTILL 1 DROP INTO BOTH EYES QD  5  . metFORMIN (GLUCOPHAGE) 1000 MG tablet TAKE 1 TABLET BY MOUTH  TWICE DAILY 180 tablet 1  . metoprolol succinate (TOPROL-XL) 100 MG 24 hr tablet Take 1 tablet (100 mg total) by mouth daily. Take with or immediately following a meal. 90 tablet 3  . Multiple Vitamin (MULTIVITAMIN WITH MINERALS) TABS tablet Take 1 tablet by mouth daily.    . nitroGLYCERIN (NITROSTAT) 0.4 MG SL tablet Place 1 tablet (0.4 mg total) under the tongue every 5 (five) minutes as needed for chest pain. MAXIMUM 3 TABLETS 50 tablet 3  . ONETOUCH DELICA LANCETS 47Q MISC Use three times daily to check blood sugar. 100 each 11  . pantoprazole (PROTONIX) 40 MG tablet TAKE 1 TABLET(40 MG) BY MOUTH TWICE DAILY BEFORE A MEAL 60 tablet 0  . tamsulosin (FLOMAX) 0.4 MG CAPS capsule TAKE 1 CAPSULE BY MOUTH EVERY DAY 90 capsule 1  . glucose blood test strip Use as instructed three times daily 100 each 12  . HUMULIN N 100 UNIT/ML injection INJECT 15 UNITS UNDER THE SKIN AT BEDTIME 10 mL 0  . HYDROcodone-acetaminophen (NORCO/VICODIN) 5-325 MG tablet Take 1 tablet by mouth 2 (two) times daily as needed for moderate pain (KNEE PAIN). 60 tablet 0  . JANUVIA 100 MG tablet TAKE 1 TABLET(100 MG) BY MOUTH DAILY. NOTE DOSE INCREASE 30 tablet 0  . atorvastatin (LIPITOR) 40 MG tablet TAKE 1 TABLET BY MOUTH EVERY DAY (Patient not taking: Reported on 07/02/2017) 90 tablet 2  . glipiZIDE (GLUCOTROL) 10 MG tablet TAKE 1 TABLET(10 MG) BY MOUTH TWICE DAILY (Patient not taking: Reported on 07/02/2017) 60 tablet 0  . metFORMIN (GLUCOPHAGE) 1000 MG tablet TAKE 1 TABLET BY MOUTH TWICE DAILY (Patient not taking: Reported on 07/02/2017) 180 tablet 0   No facility-administered medications prior to visit.     Review of Systems;  Patient denies headache, fevers, malaise, unintentional weight loss, skin rash, eye pain, sinus congestion and sinus pain, sore throat, dysphagia,  hemoptysis , cough, dyspnea, wheezing, chest pain, palpitations, orthopnea, edema, abdominal pain, nausea, melena, diarrhea, constipation, flank pain,  dysuria, hematuria, urinary  Frequency, nocturia, numbness, tingling, seizures,  Focal weakness, Loss of consciousness,  Tremor, insomnia, depression, anxiety, and suicidal ideation.      Objective:  BP (!) 150/78 (BP Location: Left Arm, Patient Position: Sitting, Cuff Size: Normal)   Pulse 62   Temp 98 F (36.7 C) (Oral)   Resp 15   Ht '5\' 9"'  (1.753 m)   Wt 196 lb 12.8 oz (89.3 kg)   SpO2 93%   BMI 29.06 kg/m   BP Readings from Last 3 Encounters:  07/02/17 (!) 150/78  07/01/17 (!) 171/72  04/01/17 140/68    Wt Readings from Last 3 Encounters:  07/02/17 196 lb 12.8 oz (89.3 kg)  07/01/17 195 lb 6.4 oz (88.6 kg)  04/01/17 198 lb 3.2 oz (89.9 kg)    General appearance:  alert, cooperative and appears stated age Ears: normal TM's and external ear canals both ears Throat: lips, mucosa, and tongue normal; teeth and gums normal Neck: no adenopathy, no carotid bruit, supple, symmetrical, trachea midline and thyroid not enlarged, symmetric, no tenderness/mass/nodules Back: symmetric, no curvature. ROM normal. No CVA tenderness. Lungs: clear to auscultation bilaterally Heart: regular rate and rhythm, S1, S2 normal, no murmur, click, rub or gallop Abdomen: soft, non-tender; bowel sounds normal; no masses,  no organomegaly Pulses: 2+ and symmetric Skin: Skin color, texture, turgor normal. No rashes or lesions Lymph nodes: Cervical, supraclavicular, and axillary nodes normal.  Lab Results  Component Value Date   HGBA1C 8.3 (H) 04/10/2017   HGBA1C 6.3 01/02/2017   HGBA1C 7.6 (H) 10/05/2016    Lab Results  Component Value Date   CREATININE 1.18 04/10/2017   CREATININE 1.48 01/02/2017   CREATININE 1.30 12/17/2016    Lab Results  Component Value Date   WBC 7.8 02/27/2017   HGB 11.6 (L) 02/27/2017   HCT 35.2 (L) 02/27/2017   PLT 353 02/27/2017   GLUCOSE 192 (H) 04/10/2017   CHOL 121 04/10/2017   TRIG 125.0 04/10/2017   HDL 34.70 (L) 04/10/2017   LDLDIRECT 62.0 10/04/2016     LDLCALC 61 04/10/2017   ALT 15 04/10/2017   AST 16 04/10/2017   NA 137 04/10/2017   K 4.8 04/10/2017   CL 104 04/10/2017   CREATININE 1.18 04/10/2017   BUN 14 04/10/2017   CO2 27 04/10/2017   TSH 2.943 10/20/2016   PSA 0.83 10/10/2014   INR 1.22 11/21/2016   HGBA1C 8.3 (H) 04/10/2017   MICROALBUR 1.3 04/10/2017    No results found.  Assessment & Plan:   Problem List Items Addressed This Visit    Hyperlipidemia - Primary   Relevant Orders   Lipid panel   Hypertension   Relevant Orders   Comprehensive metabolic panel   Left knee DJD    Still severe despite prior knee replacement, right knee hurting as well.  Continue vicodin  Daily per narcotcis contract Refill history confirmed via [place] Controlled Substance database, accessed by me today..      Relevant Medications   HYDROcodone-acetaminophen (NORCO/VICODIN) 5-325 MG tablet   Peripheral vascular disease in diabetes mellitus ([place])    He had follow up with Dr Lucky Cowboy in July for carotid dopplers and ABIs. There has been no progression 40-59% stenosis bilateral IVA's R ABI 0.98,  L ABI 1.13  Continue statin ,  Asa cilastazol and annual follow up      T2DM (type 2 diabetes mellitus) ([place])    He can no longer afford JANUVIA.  jardiance considered but will aggravate his BPH with urinary symptoms .  I have increased his dose of NPH to 15 units at night and 10 units in the am . Will stop glipizide if afternoon hypoglycemic events continue.  RTC  For labs in 2 weeks   Lab Results  Component Value Date   HGBA1C 8.3 (H) 04/10/2017         Relevant Orders   Hemoglobin A1c   Microalbumin / creatinine urine ratio      I have discontinued Mr. Adib Wahba. I am also having him maintain his magic mouthwash, ONE TOUCH ULTRA SYSTEM KIT, ONETOUCH DELICA LANCETS 54Y, latanoprost, INS SYRINGE/NEEDLE 1CC/28G, insulin lispro protamine-lispro, metFORMIN, fenofibrate micronized, nitroGLYCERIN, multivitamin with minerals, aspirin,  metoprolol succinate, Vitamin D3, famotidine, tamsulosin, glipiZIDE, gabapentin, atorvastatin, pantoprazole, cilostazol, and HYDROcodone-acetaminophen.  Meds ordered this encounter  Medications  .  DISCONTD: HYDROcodone-acetaminophen (NORCO/VICODIN) 5-325 MG tablet    Sig: Take 1 tablet by mouth 2 (two) times daily as needed for moderate pain (KNEE PAIN).    Dispense:  60 tablet    Refill:  0    May refill on or after  July 02 2017  . DISCONTD: HYDROcodone-acetaminophen (NORCO/VICODIN) 5-325 MG tablet    Sig: Take 1 tablet by mouth 2 (two) times daily as needed for moderate pain (KNEE PAIN).    Dispense:  60 tablet    Refill:  0    May refill on or after  August 01 2017  . HYDROcodone-acetaminophen (NORCO/VICODIN) 5-325 MG tablet    Sig: Take 1 tablet by mouth 2 (two) times daily as needed for moderate pain (KNEE PAIN).    Dispense:  60 tablet    Refill:  0    May refill on or after  August 31 2017   A total of 25 minutes of face to face time was spent with patient more than half of which was spent in counselling about the above mentioned conditions  and coordination of care    Medications Discontinued During This Encounter  Medication Reason  . atorvastatin (LIPITOR) 40 MG tablet Duplicate  . glipiZIDE (GLUCOTROL) 10 MG tablet Duplicate  . metFORMIN (GLUCOPHAGE) 1000 MG tablet Patient has not taken in last 30 days  . HYDROcodone-acetaminophen (NORCO/VICODIN) 5-325 MG tablet Reorder  . HYDROcodone-acetaminophen (NORCO/VICODIN) 5-325 MG tablet Reorder  . HYDROcodone-acetaminophen (NORCO/VICODIN) 5-325 MG tablet Reorder  . JANUVIA 100 MG tablet     Follow-up: Return in about 3 months (around 10/01/2017) for med refill,  fasting labs october .   [name], MD

## 2017-07-03 ENCOUNTER — Other Ambulatory Visit: Payer: Self-pay | Admitting: Internal Medicine

## 2017-07-04 ENCOUNTER — Ambulatory Visit: Payer: Medicare Other | Admitting: Internal Medicine

## 2017-07-04 NOTE — Assessment & Plan Note (Addendum)
He had follow up with Dr Lucky Cowboy in July for carotid dopplers and ABIs. There has been no progression 40-59% stenosis bilateral IVA's R ABI 0.98,  L ABI 1.13  Continue statin ,  Asa cilastazol and annual follow up

## 2017-07-04 NOTE — Assessment & Plan Note (Signed)
Still severe despite prior knee replacement, right knee hurting as well.  Continue vicodin  Daily per narcotcis contract Refill history confirmed via [place] Controlled Substance database, accessed by me today.[name]

## 2017-07-04 NOTE — Assessment & Plan Note (Addendum)
He can no longer afford JANUVIA.  jardiance considered but will aggravate his BPH with urinary symptoms .  I have increased his dose of NPH to 15 units at night and 10 units in the am . Will stop glipizide if afternoon hypoglycemic events continue.  RTC  For labs in 2 weeks   Lab Results  Component Value Date   HGBA1C 8.3 (H) 04/10/2017

## 2017-07-15 ENCOUNTER — Other Ambulatory Visit: Payer: Self-pay | Admitting: Internal Medicine

## 2017-08-01 ENCOUNTER — Other Ambulatory Visit: Payer: Self-pay | Admitting: Internal Medicine

## 2017-08-06 ENCOUNTER — Other Ambulatory Visit (INDEPENDENT_AMBULATORY_CARE_PROVIDER_SITE_OTHER): Payer: Medicare Other

## 2017-08-06 DIAGNOSIS — E1151 Type 2 diabetes mellitus with diabetic peripheral angiopathy without gangrene: Secondary | ICD-10-CM | POA: Diagnosis not present

## 2017-08-06 DIAGNOSIS — I10 Essential (primary) hypertension: Secondary | ICD-10-CM | POA: Diagnosis not present

## 2017-08-06 DIAGNOSIS — E782 Mixed hyperlipidemia: Secondary | ICD-10-CM

## 2017-08-06 LAB — LIPID PANEL
CHOLESTEROL: 102 mg/dL (ref 0–200)
HDL: 46.5 mg/dL (ref >39.00)
LDL CALC: 36 mg/dL (ref 0–99)
NonHDL: 55.77
TRIGLYCERIDES: 99 mg/dL (ref 0.0–149.0)
Total CHOL/HDL Ratio: 2
VLDL: 19.8 mg/dL (ref 0.0–40.0)

## 2017-08-06 LAB — COMPREHENSIVE METABOLIC PANEL
ALBUMIN: 3.9 g/dL (ref 3.5–5.2)
ALK PHOS: 72 U/L (ref 39–117)
ALT: 17 U/L (ref 0–53)
AST: 15 U/L (ref 0–37)
BUN: 17 mg/dL (ref 6–23)
CHLORIDE: 106 meq/L (ref 96–112)
CO2: 25 mEq/L (ref 19–32)
Calcium: 9.3 mg/dL (ref 8.4–10.5)
Creatinine, Ser: 1.12 mg/dL (ref 0.40–1.50)
GFR: 66.67 mL/min (ref >60.00)
Glucose, Bld: 153 mg/dL — ABNORMAL HIGH (ref 70–99)
POTASSIUM: 4.9 meq/L (ref 3.5–5.1)
SODIUM: 139 meq/L (ref 135–145)
TOTAL PROTEIN: 6.8 g/dL (ref 6.0–8.3)
Total Bilirubin: 0.6 mg/dL (ref 0.2–1.2)

## 2017-08-06 LAB — MICROALBUMIN / CREATININE URINE RATIO
Creatinine,U: 90.1 mg/dL
Microalb Creat Ratio: 6.2 mg/g (ref 0.0–30.0)
Microalb, Ur: 5.6 mg/dL — ABNORMAL HIGH (ref 0.0–1.9)

## 2017-08-06 LAB — HEMOGLOBIN A1C: Hgb A1c MFr Bld: 9 % — ABNORMAL HIGH (ref 4.6–6.5)

## 2017-08-18 ENCOUNTER — Other Ambulatory Visit: Payer: Self-pay | Admitting: Internal Medicine

## 2017-08-21 ENCOUNTER — Telehealth: Payer: Self-pay | Admitting: Family

## 2017-08-21 ENCOUNTER — Ambulatory Visit (INDEPENDENT_AMBULATORY_CARE_PROVIDER_SITE_OTHER): Payer: Medicare Other | Admitting: Family

## 2017-08-21 ENCOUNTER — Other Ambulatory Visit: Payer: Self-pay | Admitting: Family

## 2017-08-21 ENCOUNTER — Encounter: Payer: Self-pay | Admitting: Family

## 2017-08-21 ENCOUNTER — Ambulatory Visit
Admission: RE | Admit: 2017-08-21 | Discharge: 2017-08-21 | Disposition: A | Discharge status: Home / Self Care | Payer: Medicare Other | Source: Ambulatory Visit | Attending: Family | Admitting: Family

## 2017-08-21 ENCOUNTER — Ambulatory Visit (INDEPENDENT_AMBULATORY_CARE_PROVIDER_SITE_OTHER): Payer: Medicare Other

## 2017-08-21 VITALS — BP 122/52 | HR 78 | Temp 97.9°F | Ht 69.0 in | Wt 195.8 lb

## 2017-08-21 DIAGNOSIS — R000 Tachycardia, unspecified: Secondary | ICD-10-CM | POA: Diagnosis not present

## 2017-08-21 DIAGNOSIS — R1032 Left lower quadrant pain: Secondary | ICD-10-CM | POA: Diagnosis not present

## 2017-08-21 DIAGNOSIS — K5730 Diverticulosis of large intestine without perforation or abscess without bleeding: Secondary | ICD-10-CM | POA: Insufficient documentation

## 2017-08-21 DIAGNOSIS — I700 Atherosclerosis of aorta: Secondary | ICD-10-CM | POA: Diagnosis not present

## 2017-08-21 DIAGNOSIS — R05 Cough: Secondary | ICD-10-CM

## 2017-08-21 DIAGNOSIS — R059 Cough, unspecified: Secondary | ICD-10-CM

## 2017-08-21 DIAGNOSIS — K5792 Diverticulitis of intestine, part unspecified, without perforation or abscess without bleeding: Secondary | ICD-10-CM

## 2017-08-21 DIAGNOSIS — Z9049 Acquired absence of other specified parts of digestive tract: Secondary | ICD-10-CM | POA: Diagnosis not present

## 2017-08-21 LAB — COMPREHENSIVE METABOLIC PANEL
ALBUMIN: 4.2 g/dL (ref 3.5–5.2)
ALK PHOS: 74 U/L (ref 39–117)
ALT: 15 U/L (ref 0–53)
AST: 14 U/L (ref 0–37)
BUN: 38 mg/dL — ABNORMAL HIGH (ref 6–23)
CALCIUM: 9.6 mg/dL (ref 8.4–10.5)
CHLORIDE: 101 meq/L (ref 96–112)
CO2: 26 mEq/L (ref 19–32)
CREATININE: 1.77 mg/dL — AB (ref 0.40–1.50)
GFR: 39.31 mL/min — ABNORMAL LOW (ref >60.00)
Glucose, Bld: 206 mg/dL — ABNORMAL HIGH (ref 70–99)
Potassium: 4.8 mEq/L (ref 3.5–5.1)
Sodium: 138 mEq/L (ref 135–145)
TOTAL PROTEIN: 7.5 g/dL (ref 6.0–8.3)
Total Bilirubin: 0.8 mg/dL (ref 0.2–1.2)

## 2017-08-21 LAB — CBC WITH DIFFERENTIAL/PLATELET
BASOS PCT: 0.2 % (ref 0.0–3.0)
Basophils Absolute: 0 10*3/uL (ref 0.0–0.1)
EOS ABS: 0.2 10*3/uL (ref 0.0–0.7)
EOS PCT: 1.5 % (ref 0.0–5.0)
HEMATOCRIT: 38.7 % — AB (ref 39.0–52.0)
HEMOGLOBIN: 12 g/dL — AB (ref 13.0–17.0)
LYMPHS PCT: 33.1 % (ref 12.0–46.0)
Lymphs Abs: 4.2 10*3/uL — ABNORMAL HIGH (ref 0.7–4.0)
MCHC: 30.9 g/dL (ref 30.0–36.0)
MCV: 78.2 fl (ref 78.0–100.0)
Monocytes Absolute: 1 10*3/uL (ref 0.1–1.0)
Monocytes Relative: 7.8 % (ref 3.0–12.0)
Neutro Abs: 7.3 10*3/uL (ref 1.4–7.7)
Neutrophils Relative %: 57.4 % (ref 43.0–77.0)
Platelets: 285 10*3/uL (ref 150.0–400.0)
RBC: 4.95 Mil/uL (ref 4.22–5.81)
RDW: 17.9 % — AB (ref 11.5–15.5)
WBC: 12.7 10*3/uL — AB (ref 4.0–10.5)

## 2017-08-21 LAB — POCT URINALYSIS DIPSTICK
BILIRUBIN UA: NEGATIVE
Blood, UA: NEGATIVE
GLUCOSE UA: NEGATIVE
KETONES UA: NEGATIVE
LEUKOCYTES UA: NEGATIVE
Nitrite, UA: NEGATIVE
Spec Grav, UA: 1.02 (ref 1.010–1.025)
Urobilinogen, UA: 0.2 E.U./dL
pH, UA: 5.5 (ref 5.0–8.0)

## 2017-08-21 LAB — URINALYSIS, MICROSCOPIC ONLY

## 2017-08-21 LAB — LIPASE: LIPASE: 5 U/L — AB (ref 11.0–59.0)

## 2017-08-21 MED ORDER — LEVOFLOXACIN 750 MG PO TABS: 750.0000 mg | ORAL_TABLET | Freq: Every day | ORAL | 0 refills | Status: DC

## 2017-08-21 MED ORDER — IOPAMIDOL (ISOVUE-300) INJECTION 61%
100.0000 mL | Freq: Once | INTRAVENOUS | Status: AC | PRN
  Administered 2017-08-21: 100 mL via INTRAVENOUS

## 2017-08-21 MED ORDER — LEVOFLOXACIN 750 MG PO TABS: 750.0000 mg | ORAL_TABLET | ORAL | 0 refills | Status: AC

## 2017-08-21 MED ORDER — METRONIDAZOLE 500 MG PO TABS: 500.0000 mg | ORAL_TABLET | Freq: Three times a day (TID) | ORAL | 0 refills | Status: DC

## 2017-08-21 NOTE — Telephone Encounter (Signed)
Call pt  CT shows diverticulitis   Will result full report next week however wanted him to know immediately.  Take abx as prescribed and please make f/u with pcp.  Ask pt to  also call us tomorrow to let us know how he is doing

## 2017-08-21 NOTE — Progress Notes (Signed)
Pre visit review using our clinic review tool, if applicable. No additional management support is needed unless otherwise documented below in the visit note. 

## 2017-08-21 NOTE — Patient Instructions (Signed)
Hold glipizide while on levaquin due to risk of hypoglycemia. Please check blood sugars however and if become elevated, please let us know, as then would advise to start back on glipizide.   CT   CXR  Labs today  KEEP PHONE ON YOU TODAY   Please stay very vigilant and if ANY worsening symptoms, please go to emergency room.

## 2017-08-21 NOTE — Telephone Encounter (Signed)
Left detailed message informing patient of results.  

## 2017-08-21 NOTE — Progress Notes (Signed)
Subjective:    Patient ID: Henry Lindsey, male    DOB: Dec 06, 1932, 83 y.o.   MRN: [id number]  CC: Henry Lindsey is a 83 y.o. male who presents today for an acute visit.    HPI: CC: left flank pain  X  4 days, worsening. Hurts 'to push it.'  No pain if doesn't touch it. No rash, fever, vomiting, constipation, blood in stool or urine, dysuria. Last BM this morning, normal, no blood or pain with BM.     4 days ago left flank pain , radiating to pubic bone.   Also complains of thick green congestion with cough x 6 months, worsening. 'think I have pneumonia'. Fatigue started couple of weeks ago , 'it got worse when cough got worse.' No wheezing, SOB. 'not getting winded but feel exhausted.' Using OTC cough and flu, mucinex with no relief.   Quit smoking at 83 yo  [place] 01/2016  HISTORY:  Past Medical History:  Diagnosis Date  . BPH (benign prostatic hyperplasia)   . Cancer ([place])   . Carotid artery stenosis 11/2010   <50% bilaterally  . CVA (cerebral infarction) 11/2010   r thalamic lacunar  . Diabetes mellitus   . GERD (gastroesophageal reflux disease)   . Hyperlipidemia   . Hypertension   . Neuromuscular disorder ([place])   . Peripheral vascular disease in diabetes mellitus ([place]) 06/2012    95% occlusion s/p PTCA R SFA [place] Sept 2013  . Stroke [facility])    Past Surgical History:  Procedure Laterality Date  . CHOLECYSTECTOMY N/A 11/13/2016   Procedure: LAPAROSCOPIC CHOLECYSTECTOMY WITH INTRAOPERATIVE CHOLANGIOGRAM;  Surgeon: Olean Ree, MD;  Location: [facility] ORS;  Service: General;  Laterality: N/A;  . ERCP N/A 11/17/2016   Procedure: ENDOSCOPIC RETROGRADE CHOLANGIOPANCREATOGRAPHY (ERCP);  Surgeon: Irene Shipper, MD;  Location[facility] ENDOSCOPY;  Service: Endoscopy;  Laterality: N/A;  . ERCP N/A 02/04/2017   Procedure: ENDOSCOPIC RETROGRADE CHOLANGIOPANCREATOGRAPHY (ERCP) Stent removal;  Surgeon: Lucilla Lame, MD;  Location: [facility] ENDOSCOPY;  Service: Endoscopy;  Laterality: N/A;  . IR GENERIC  HISTORICAL  10/06/2016   IR PERC CHOLECYSTOSTOMY 10/06/2016 Aletta Edouard, MD [facility]-INTERV RAD  . JOINT REPLACEMENT Left 2014   left knee  . UPPER GASTROINTESTINAL ENDOSCOPY    . WRIST SURGERY     Family History  Problem Relation Age of Onset  . Diabetes Mother     Allergies: Patient has no known allergies. Current Outpatient Prescriptions on File Prior to Visit  Medication Sig Dispense Refill  . Alum & Mag Hydroxide-Simeth (MAGIC MOUTHWASH) SOLN Take 5 mLs by mouth 3 (three) times daily as needed for mouth pain.    [name] aspirin 81 MG tablet Take 1 tablet (81 mg total) by mouth daily.    [name] atorvastatin (LIPITOR) 40 MG tablet TAKE 1 TABLET BY MOUTH EVERY DAY 90 tablet 0  . Blood Glucose Monitoring Suppl (ONE TOUCH ULTRA SYSTEM KIT) w/Device KIT 1 kit by Does not apply route once. Use DX code E11.59 1 each 0  . Cholecalciferol (VITAMIN D3) 2000 units TABS Take 1 tablet by mouth daily.    . cilostazol (PLETAL) 100 MG tablet TAKE 1 TABLET(100 MG) BY MOUTH TWICE DAILY 60 tablet 0  . famotidine (PEPCID) 20 MG tablet TAKE 1 TABLET(20 MG) BY MOUTH TWICE DAILY 60 tablet 0  . fenofibrate micronized (LOFIBRA) 134 MG capsule TAKE 1 CAPSULE BY MOUTH EVERY MORNING BEFORE BREAKFAST (Patient taking differently: TAKE 1 CAPSULE tid) 30 capsule 0  . gabapentin (NEURONTIN) 400  MG capsule TAKE 1 CAPSULE(400 MG) BY MOUTH THREE TIMES DAILY 270 capsule 1  . glipiZIDE (GLUCOTROL) 10 MG tablet Take 1 tablet (10 mg total) by mouth 2 (two) times daily before a meal. 60 tablet 4  . HUMULIN N 100 UNIT/ML injection INJECT 15 UNITS UNDER THE SKIN AT BEDTIME 10 mL 2  . HYDROcodone-acetaminophen (NORCO/VICODIN) 5-325 MG tablet Take 1 tablet by mouth 2 (two) times daily as needed for moderate pain (KNEE PAIN). 60 tablet 0  . INS SYRINGE/NEEDLE 1CC/28G (B-D INSULIN SYRINGE 1CC/28G) 28G X 1/2" 1 ML MISC USE TO ADMINISTER INSULIN DAILY 100 each 0  . insulin lispro protamine-lispro (HUMALOG MIX 75/25) (75-25) 100 UNIT/ML SUSP  injection Inject 10 units right before  breakfast and 6 units before dinner (Patient taking differently: 6-8 Units 2 (two) times daily. Inject 6 qam and 8 qpm) 10 mL 5  . latanoprost (XALATAN) 0.005 % ophthalmic solution INSTILL 1 DROP INTO BOTH EYES QD  5  . metFORMIN (GLUCOPHAGE) 1000 MG tablet TAKE 1 TABLET BY MOUTH TWICE DAILY 180 tablet 1  . metFORMIN (GLUCOPHAGE) 1000 MG tablet TAKE 1 TABLET BY MOUTH TWICE DAILY 180 tablet 0  . metoprolol succinate (TOPROL-XL) 100 MG 24 hr tablet Take 1 tablet (100 mg total) by mouth daily. Take with or immediately following a meal. 90 tablet 3  . Multiple Vitamin (MULTIVITAMIN WITH MINERALS) TABS tablet Take 1 tablet by mouth daily.    . nitroGLYCERIN (NITROSTAT) 0.4 MG SL tablet Place 1 tablet (0.4 mg total) under the tongue every 5 (five) minutes as needed for chest pain. MAXIMUM 3 TABLETS 50 tablet 3  . ONE TOUCH ULTRA TEST test strip USE THREE TIMES DAILY AS DIRECTED 100 each 0  . ONETOUCH DELICA LANCETS 32P MISC Use three times daily to check blood sugar. 100 each 11  . pantoprazole (PROTONIX) 40 MG tablet TAKE 1 TABLET(40 MG) BY MOUTH TWICE DAILY BEFORE A MEAL 60 tablet 0  . tamsulosin (FLOMAX) 0.4 MG CAPS capsule TAKE 1 CAPSULE BY MOUTH EVERY DAY 90 capsule 1   No current facility-administered medications on file prior to visit.     Social History  Substance Use Topics  . Smoking status: Former Smoker    Packs/day: 0.50    Years: 33.00    Types: Cigarettes    Start date: [name] 06, 1933    Quit date: 04/21/1968  . Smokeless tobacco: Never Used  . Alcohol use No    Review of Systems  Constitutional: Negative for chills and fever.  HENT: Positive for congestion. Negative for ear pain, sinus pain and voice change.   Respiratory: Positive for cough. Negative for shortness of breath and wheezing.   Cardiovascular: Negative for chest pain and palpitations.  Gastrointestinal: Positive for abdominal pain. Negative for abdominal distention, diarrhea,  nausea and vomiting.  Skin: Negative for rash.  Neurological: Negative for headaches.      Objective:    BP (!) 122/52   Pulse 78   Temp 97.9 F (36.6 C) (Oral)   Ht '5\' 9"'  (1.753 m)   Wt 195 lb 12.8 oz (88.8 kg)   SpO2 96%   BMI 28.91 kg/m  BP Readings from Last 3 Encounters:  08/21/17 (!) 122/52  07/02/17 (!) 150/78  07/01/17 (!) 171/72     Physical Exam  Constitutional: He appears well-developed and well-nourished.  Cardiovascular: Regular rhythm and normal heart sounds.   Pulmonary/Chest: Effort normal. No respiratory distress. He has wheezes. He has rales in the left lower field.  Abdominal: Soft. Normal appearance and bowel sounds are normal. He exhibits no distension, no fluid wave, no ascites and no mass. There is no tenderness. There is no rigidity, no rebound, no guarding, no CVA tenderness, no tenderness at McBurney's point and negative Murphy's sign.  Neurological: He is alert.  Skin: Skin is warm and dry.  Psychiatric: He has a normal mood and affect. His speech is normal and behavior is normal.  Vitals reviewed.  Patient felt better after albuterol treatment. Lung sounds increased    1. Cough Afebrile well-appearing. Patient is not latored in speech. Cheery, talkative. SaO2 in room is 96%. We walked patient around entire clinic, at lowest, 90%. Patient is not having any chest pain. Monitored patient during albuterol nebulizer  to ensure HR not further elevated with treatment. At end of treatment HR 120.  EKG shows tachycardia however no acute ischemia or afib. When compared to prior EKG 09/2016, no significant changes.   - POCT Urinalysis Dipstick - CBC with Differential/Platelet - DG Chest 2 View - levofloxacin (LEVAQUIN) 750 MG tablet; Take 1 tablet (750 mg total) by mouth daily.  Dispense: 7 tablet; Refill: 0  2. Tachycardia Suspect left lower quadrant pain, coughing contributory.Urinalysis was negative for nitrites, leukocytes, blood. Low clinical  suspicion for renal stone.due to age, fragility, living alone and  Comorbitiies, advised patient to get to the ED; he politely declined this. He states he  'feels well". I discussed with him he could rapidly decompensate and advised him to stay vigilant and ask a friend which he mentioned to come over and stay with him. He agreed and verbalized understanding of this. Return precautions given. - POCT Urinalysis Dipstick - Urine Culture - Urine Microscopic - EKG 12-Lead - DG Chest 2 View  3. Left lower quadrant pain Afebrile. Left lower quadrant pain. Working diagnosis of diverticulitis We'll go ahead and start empiric therapy to cover for pneumonia, diverticulitis with Levaquin, Flagyl. Close vigilance of follow-up.  - CT ABDOMEN PELVIS W CONTRAST - CBC with Differential/Platelet - Comprehensive metabolic panel - Lipase - levofloxacin (LEVAQUIN) 750 MG tablet; Take 1 tablet (750 mg total) by mouth daily.  Dispense: 7 tablet; Refill: 0 - metroNIDAZOLE (FLAGYL) 500 MG tablet; Take 1 tablet (500 mg total) by mouth 3 (three) times daily.  Dispense: 21 tablet; Refill: 0    I am having Mr. Rauber start on levofloxacin and metroNIDAZOLE. I am also having him maintain his magic mouthwash, ONE TOUCH ULTRA SYSTEM KIT, ONETOUCH DELICA LANCETS 73A, latanoprost, INS SYRINGE/NEEDLE 1CC/28G, insulin lispro protamine-lispro, metFORMIN, fenofibrate micronized, nitroGLYCERIN, multivitamin with minerals, aspirin, metoprolol succinate, Vitamin D3, tamsulosin, glipiZIDE, gabapentin, HYDROcodone-acetaminophen, ONE TOUCH ULTRA TEST, HUMULIN N, atorvastatin, metFORMIN, cilostazol, pantoprazole, and famotidine.   Meds ordered this encounter  Medications  . levofloxacin (LEVAQUIN) 750 MG tablet    Sig: Take 1 tablet (750 mg total) by mouth daily.    Dispense:  7 tablet    Refill:  0    Order Specific Question:   Supervising Provider    Answer:   Deborra [name] L [[id number]]  . metroNIDAZOLE (FLAGYL) 500 MG tablet     Sig: Take 1 tablet (500 mg total) by mouth 3 (three) times daily.    Dispense:  21 tablet    Refill:  0    Order Specific Question:   Supervising Provider    Answer:   [name] [[id number]]    Return precautions given.   Risks, benefits, and alternatives of the medications and treatment plan  prescribed today were discussed, and patient expressed understanding.   Education regarding symptom management and diagnosis given to patient on AVS.  Continue to follow with [name], MD for routine health maintenance.   [name] and I agreed with plan.   Mable Paris, FNP  Case reviewed with supervising, Dr Deborra [name], and she and I jointly agreed on management plan.

## 2017-08-22 LAB — URINE CULTURE
MICRO NUMBER:: 81227187
Result:: NO GROWTH
SPECIMEN QUALITY:: ADEQUATE

## 2017-08-25 ENCOUNTER — Telehealth: Payer: Self-pay | Admitting: Family

## 2017-08-25 ENCOUNTER — Telehealth: Payer: Self-pay | Admitting: Internal Medicine

## 2017-08-25 NOTE — Telephone Encounter (Signed)
FYI

## 2017-08-25 NOTE — Telephone Encounter (Signed)
Spoke with patient from  Home.  He is feeling much better ' a new man.'  Afebrile and abdominal pain, cough have improved Advised probiotics  Return precautions given

## 2017-08-25 NOTE — Telephone Encounter (Signed)
Copied from [place] #[id number]. Topic: Quick Communication - See Telephone Encounter >> Aug 25, 2017  4:13 PM Robina Ade, Helene Kelp D wrote: CRM for notification. See Telephone encounter for: 08/25/17. Patient called to let provider know that he is getting better. He does not have green mucus anymore and that the pain on his side is gone. The medication is working well for him. If you have any questions for him to call him back, thanks.

## 2017-09-01 ENCOUNTER — Ambulatory Visit: Payer: Medicare Other | Admitting: Pharmacist

## 2017-09-02 ENCOUNTER — Other Ambulatory Visit: Payer: Self-pay | Admitting: Internal Medicine

## 2017-09-02 ENCOUNTER — Telehealth: Payer: Self-pay | Admitting: Internal Medicine

## 2017-09-02 NOTE — Telephone Encounter (Signed)
No ,  The radiologist felt like it was scarring or underinflated lung,  Not a mass

## 2017-09-02 NOTE — Telephone Encounter (Signed)
-----   Message from Nanci Pina, LPN sent at [id number]/56/3875 12:00 PM EST ----- Regarding: RE: CT Abdomen review Patient says he has an appointment scheduled with PCP on 10/03/17 would keeping this appointment be ok? Patient also said that he has not smoked since he was 56 and that he has never had a problem, doe she really need the CT of his chest?  ----- Message ----- From: [name], MD Sent: 09/02/2017   9:06 AM To: Nanci Pina, LPN, Burnard Hawthorne, FNP Subject: RE: CT Abdomen review                          Thanks margaret.  I will have the office schedule patient a follow up with me in the next 3-4 weeks ot make sure he understands how serious all this is.   Juliann Pulse do you mind?  Thanks  TT.   ----- Message ----- From: Burnard Hawthorne, FNP Sent: 09/01/2017   6:07 PM To: [name], MD Subject: CT Abdomen review                              Dr Derrel Nip,   I diagnosed diverticulitis in the patient of your ( you also saw him that day - thankfully ).   I wanted to ensure that I appropriately addressed the other findings of CT if you wouldn't mind reviewing image.   I advised the below-  Couple of things:   Lower chest shows density. Perhaps scarring. With your smoking history however, I would advise CT Chest to ensure no malignancy. Let me know if you agree.   You still have small collection of fluid around gallbladder however much decreased which I presume related to your bile duct leak. Do you still see Dr Allen Norris? I would advise you follow up with regarding expectations of this fluid and when it should be completely resolved.   You have severe calcification of vessels. When you saw cardiology last year for pre op clearance, Dr Rockey Situ was aware of this. At that time, you were not having fatigue, exertional chest pain or pressure, numbness or tingling radiating to left arm or jaw, palpitations, dizziness, shortness of breath etc. If you are having any symptoms which may be  suggestive of cardiac, I would advise we refer you back to cardiology for further evaluation. Otherwise, you need to stay on medication therapy - lipitor- for cholesterol.    Based on CT , would you advise any differently? Feel free to call me if easier to discuss on phone. T  Wanted to ensure we discussed prior to my leaving- and sorry for painfully long message.[name]   M

## 2017-09-08 ENCOUNTER — Ambulatory Visit (INDEPENDENT_AMBULATORY_CARE_PROVIDER_SITE_OTHER): Payer: Medicare Other | Admitting: Pharmacist

## 2017-09-08 ENCOUNTER — Encounter: Payer: Self-pay | Admitting: Pharmacist

## 2017-09-08 ENCOUNTER — Ambulatory Visit
Admission: RE | Admit: 2017-09-08 | Discharge: 2017-09-08 | Disposition: A | Discharge status: Home / Self Care | Payer: Medicare Other | Source: Ambulatory Visit | Attending: Family Medicine | Admitting: Family Medicine

## 2017-09-08 ENCOUNTER — Encounter: Payer: Self-pay | Admitting: Family Medicine

## 2017-09-08 ENCOUNTER — Other Ambulatory Visit: Payer: Self-pay | Admitting: Family Medicine

## 2017-09-08 ENCOUNTER — Ambulatory Visit (INDEPENDENT_AMBULATORY_CARE_PROVIDER_SITE_OTHER): Payer: Medicare Other | Admitting: Family Medicine

## 2017-09-08 VITALS — BP 142/80 | HR 117 | Ht 71.0 in | Wt 201.0 lb

## 2017-09-08 DIAGNOSIS — R1032 Left lower quadrant pain: Secondary | ICD-10-CM

## 2017-09-08 DIAGNOSIS — R1084 Generalized abdominal pain: Secondary | ICD-10-CM | POA: Diagnosis not present

## 2017-09-08 DIAGNOSIS — K5732 Diverticulitis of large intestine without perforation or abscess without bleeding: Secondary | ICD-10-CM | POA: Diagnosis not present

## 2017-09-08 DIAGNOSIS — E1151 Type 2 diabetes mellitus with diabetic peripheral angiopathy without gangrene: Secondary | ICD-10-CM

## 2017-09-08 MED ORDER — CIPROFLOXACIN HCL 500 MG PO TABS: 500.0000 mg | ORAL_TABLET | Freq: Two times a day (BID) | ORAL | 0 refills | Status: DC

## 2017-09-08 MED ORDER — INSULIN LISPRO PROT & LISPRO (75-25 MIX) 100 UNIT/ML ~~LOC~~ SUSP: SUBCUTANEOUS | 5 refills | Status: DC

## 2017-09-08 MED ORDER — METRONIDAZOLE 500 MG PO TABS: 500.0000 mg | ORAL_TABLET | Freq: Three times a day (TID) | ORAL | 0 refills | Status: DC

## 2017-09-08 MED ORDER — IOPAMIDOL (ISOVUE-300) INJECTION 61%
80.0000 mL | Freq: Once | INTRAVENOUS | Status: AC | PRN
  Administered 2017-09-08: 80 mL via INTRAVENOUS

## 2017-09-08 MED ORDER — LINAGLIPTIN 5 MG PO TABS: 5.0000 mg | ORAL_TABLET | Freq: Every day | ORAL | 0 refills | Status: DC

## 2017-09-08 NOTE — Patient Instructions (Addendum)
We have ordered a CT scan for your today. Results of your scan will determine treatment.   Abdominal Pain, Adult Many things can cause belly (abdominal) pain. Most times, belly pain is not dangerous. Many cases of belly pain can be watched and treated at home. Sometimes belly pain is serious, though. Your doctor will try to find the cause of your belly pain. Follow these instructions at home:  Take over-the-counter and prescription medicines only as told by your doctor. Do not take medicines that help you poop (laxatives) unless told to by your doctor.  Drink enough fluid to keep your pee (urine) clear or pale yellow.  Watch your belly pain for any changes.  Keep all follow-up visits as told by your doctor. This is important. Contact a doctor if:  Your belly pain changes or gets worse.  You are not hungry, or you lose weight without trying.  You are having trouble pooping (constipated) or have watery poop (diarrhea) for more than 2-3 days.  You have pain when you pee or poop.  Your belly pain wakes you up at night.  Your pain gets worse with meals, after eating, or with certain foods.  You are throwing up and cannot keep anything down.  You have a fever. Get help right away if:  Your pain does not go away as soon as your doctor says it should.  You cannot stop throwing up.  Your pain is only in areas of your belly, such as the right side or the left lower part of the belly.  You have bloody or black poop, or poop that looks like tar.  You have very bad pain, cramping, or bloating in your belly.  You have signs of not having enough fluid or water in your body (dehydration), such as: ? Dark pee, very little pee, or no pee. ? Cracked lips. ? Dry mouth. ? Sunken eyes. ? Sleepiness. ? Weakness. This information is not intended to replace advice given to you by your health care provider. Make sure you discuss any questions you have with your health care provider. Document  Released: 03/25/2008 Document Revised: 04/26/2016 Document Reviewed: 03/20/2016 Elsevier Interactive Patient Education  2017 [employer].

## 2017-09-08 NOTE — Progress Notes (Signed)
CT scan noted increased mild diverticulitis. No abscess or perforation found. Consulted with PCP who agreed that the plan to extend antibiotic therapy is appropriate. Will provide Cipro 500 mg BID for 7 days and metronidazole 500 mg TID for 7 days. Reviewed return precautions including worsening pain, N/V, and fever. Advised close follow up for symptom evaluation and he will follow up this week if symptoms are not improved.

## 2017-09-08 NOTE — Progress Notes (Signed)
  I have reviewed the above information and agree with above.   Abhay Godbolt, MD 

## 2017-09-08 NOTE — Patient Instructions (Signed)
Thank you for coming to see me today.   1. Start Tradjenta (linagliptin)  1 pill once a day - this is a similar drug to Januvia  2. Increase your Insulin 75/25 to 20 units in the morning and 25 units at night.   3. Come back to see me in ~ 1 month.

## 2017-09-08 NOTE — Progress Notes (Signed)
S:     Chief Complaint  Patient presents with  . Medication Management    Diabetes    Patient arrives in fair spirits, ambulating with some obvious pain.  Presents for diabetes evaluation, education, and management at the request of Dr. Derrel Nip (lab note on 08/06/17).  Patient was last seen by Primary Care Provider on 08/21/2017. Recently had to stop Januvia 2/2 cost.   Today, patient complains of recurrent abdominal pain and increased green sputum production. Endorses chills last night until 3AM, no fevers however. Of note, was recently treated for PNA and diverticulitis. He reports it was improved after finishing ABX but sx returned yesterday after he strained himself picking up a commode. Has not taken his meds today. Reports that he gets steroid shots in his knee and hip q3 months, next due in ~1 month. Notices increased in [place] after the shot.   Patient reports Diabetes was diagnosed ~20 years ago  Insurance coverage/medication affordability: U[employer] medicare, in the donut hole  Patient reports adherence with medications.  "I take them religiously" Current diabetes medications include: Humalog 75/25 20 units BID, metformin 1000 mg BID, glipizide 10 mg BID Current hypertension medications include: metoprolol succinate 100 mg daily  Patient denies hypoglycemic events.  Patient reported dietary habits: Eats 3 meals/day Breakfast: cereal OR egg sandwich Lunch: yogurt OR ham/bologna sandwich OR soup OR pizza Dinner: sandwich OR turnip greens + bread Snacks: Ice cream, nabs Drinks: water, regular soda 3x/week  Patient reported exercise habits: Push ups, walking some but limited by knee and hip pain   Patient reports nocturia. 3x/night - stable Patient denies pain/burning on urination.  Patient reports neuropathy. Patient denies visual changes. Patient reports self foot exams. No issues.  O:  Physical Exam  Constitutional: He appears well-developed and well-nourished.      Review of Systems  Constitutional: Positive for chills.  Respiratory: Positive for cough and sputum production.   Gastrointestinal: Positive for abdominal pain.     Lab Results  Component Value Date   HGBA1C 9.0 (H) 08/06/2017   Vitals:   09/08/17 0845  BP: (!) 142/80  Pulse: (!) 117  Temp: 98.7 F (37.1 C)  SpO2: 95%   Testing BG 1-2 times daily Home fasting CBG: "was seeing 120s-130s, down to 90s before all this; yesterday 245" 2 hour post-prandial/random CBG: before supper - 200s  10 year ASCVD risk: 61%  A/P: #Diabetes longstanding diagnosed currently uncontrolled. Patient denies hypoglycemic events and is able to verbalize appropriate hypoglycemia management plan. Patient reports adherence with medication. Control is suboptimal due to stress, pain, dietary indiscretion, sedentary lifestyle, insulin resistance. Following discussion and approval by Dr. Derrel Nip, the following medication changes were made:  - Started Tradjenta 5 mg once daily - samples provided for 28 day supply - Increased Insulin 75/25 to 20 units qAM and 25 units qPM - Next A1C anticipated 11/06/2016 or later -Consider checking B12 level at next visit  #ASCVD risk - greater than 7.5%. - Continued Aspirin 81 mg  - Continued atorvastatin 40 mg.   #Hypertension longstanding currently uncontrolled.  Patient reports adherence with medication. Control is suboptimal due to fact that he has not taken any meds today. - Continue current regimen and will reassess at next visit  #Concern for continued diverticulitis and PNA. Patient assessed by Almyra Free, NP in clinic. Please see accompanying note.  Patient was seen with Dr. Derrel Nip today in clinic and medication changes were discussed and approved prior to initiation.Written patient instructions provided.  Total time in face to face counseling 30 minutes.    Follow up in Pharmacist Clinic Visit 1 month.     Carlean Jews, Pharm.D. PGY2 Ambulatory Care  Pharmacy Resident Phone: [phone]

## 2017-09-08 NOTE — Progress Notes (Signed)
Subjective:    Patient ID: Henry Lindsey, male    DOB: January 11, 1933, 83 y.o.   MRN: [id number]  HPI  Mr. Doten is an 83 year old male who walked into the clinic with lower left abdominal pain for one day.  Abdominal Pain: Location: Left sided abdominal pain Onset/Timing: One day ago and noted that this occurred after picking up a "broken commode" Duration: one day ago Severity/Quality: Sharp with palpation and noted as an 8 Worse/Better by: Hydrocodone has provided limited benefit. Associated Symptoms: reported chills when pain occurred.  No chills at this time. He was evaluated on 08/21/17 and CT was obtained. 08/21/17: CT scan indicated diffuse colonic diverticulosis that was most severe in the left colon with inflammatory change in distal descending colon compatible with early active diverticulitis. He was treated with Levaquin and Flagyl. He reports completing both antibiotics.  He denies rash, fever, N/V, constipation, blood in stool/urine, or dysuria.  ROS Loss of appetite: No Vomiting: No Diarrhea: No Rectal bleeding: No Fever: No  Weight loss: No   Review of Systems  Constitutional: Negative for chills, fatigue and fever.  Respiratory: Negative for cough, shortness of breath and wheezing.   Cardiovascular: Negative for chest pain and palpitations.  Gastrointestinal: Positive for abdominal pain. Negative for blood in stool, constipation, diarrhea, nausea and vomiting.  Genitourinary: Negative for dysuria and hematuria.  Musculoskeletal: Negative for back pain.  Skin: Negative for rash.  Neurological: Negative for dizziness, weakness and headaches.   Past Medical History:  Diagnosis Date  . BPH (benign prostatic hyperplasia)   . Carotid artery stenosis 11/2010   <50% bilaterally  . CVA (cerebral infarction) 11/2010   r thalamic lacunar  . Diabetes mellitus   . GERD (gastroesophageal reflux disease)   . Hyperlipidemia   . Hypertension   . Neuromuscular disorder  ([place])   . Peripheral vascular disease in diabetes mellitus ([place]) 06/2012    95% occlusion s/p PTCA R SFA [place] Sept 2013  . Stroke [facility])      Social History   Socioeconomic History  . Marital status: Widowed    Spouse name: Enid Derry, deceased  . Number of children: 2  . Years of education: 83  . Highest education level: Not on file  Social Needs  . Financial resource strain: Not on file  . Food insecurity - worry: Not on file  . Food insecurity - inability: Not on file  . Transportation needs - medical: Not on file  . Transportation needs - non-medical: Not on file  Occupational History    Employer: retired  Tobacco Use  . Smoking status: Former Smoker    Packs/day: 0.50    Years: 33.00    Pack years: 16.50    Types: Cigarettes    Start date: 1933-05-11    Last attempt to quit: 04/21/1968    Years since quitting: 49.4  . Smokeless tobacco: Never Used  Substance and Sexual Activity  . Alcohol use: No  . Drug use: No  . Sexual activity: No    Partners: Female    Birth control/protection: None  Other Topics Concern  . Not on file  Social History Narrative   Widowed in 2014; dating again     Past Surgical History:  Procedure Laterality Date  . ENDOSCOPIC RETROGRADE CHOLANGIOPANCREATOGRAPHY (ERCP) N/A 11/17/2016   Performed by Irene Shipper, MD at [place]  . ENDOSCOPIC RETROGRADE CHOLANGIOPANCREATOGRAPHY (ERCP) Stent removal N/A 02/04/2017   Performed by Lucilla Lame, MD at [place]  .  IR GENERIC HISTORICAL  10/06/2016   IR PERC CHOLECYSTOSTOMY 10/06/2016 Aletta Edouard, MD [facility]-INTERV RAD  . JOINT REPLACEMENT Left 2014   left knee  . LAPAROSCOPIC CHOLECYSTECTOMY WITH INTRAOPERATIVE CHOLANGIOGRAM N/A 11/13/2016   Performed by Olean Ree, MD at [facility]  . UPPER GASTROINTESTINAL ENDOSCOPY    . WRIST SURGERY      Family History  Problem Relation Age of Onset  . Diabetes Mother     No Known Allergies  Current Outpatient Medications on File Prior to Visit    Medication Sig Dispense Refill  . Alum & Mag Hydroxide-Simeth (MAGIC MOUTHWASH) SOLN Take 5 mLs by mouth 3 (three) times daily as needed for mouth pain.    [name] aspirin 81 MG tablet Take 1 tablet (81 mg total) by mouth daily.    [name] atorvastatin (LIPITOR) 40 MG tablet TAKE 1 TABLET BY MOUTH EVERY DAY 90 tablet 0  . Blood Glucose Monitoring Suppl (ONE TOUCH ULTRA SYSTEM KIT) w/Device KIT 1 kit by Does not apply route once. Use DX code E11.59 1 each 0  . Cholecalciferol (VITAMIN D3) 2000 units TABS Take 1 tablet by mouth daily.    . cilostazol (PLETAL) 100 MG tablet TAKE 1 TABLET(100 MG) BY MOUTH TWICE DAILY 60 tablet 0  . famotidine (PEPCID) 20 MG tablet TAKE 1 TABLET(20 MG) BY MOUTH TWICE DAILY (Patient taking differently: TAKE 1 TABLET(20 MG) BY MOUTH ONCE DAILY) 60 tablet 0  . fenofibrate micronized (LOFIBRA) 134 MG capsule TAKE 1 CAPSULE BY MOUTH EVERY MORNING BEFORE BREAKFAST 30 capsule 0  . gabapentin (NEURONTIN) 400 MG capsule TAKE 1 CAPSULE(400 MG) BY MOUTH THREE TIMES DAILY 270 capsule 1  . glipiZIDE (GLUCOTROL) 10 MG tablet Take 1 tablet (10 mg total) by mouth 2 (two) times daily before a meal. 60 tablet 4  . HYDROcodone-acetaminophen (NORCO/VICODIN) 5-325 MG tablet Take 1 tablet by mouth 2 (two) times daily as needed for moderate pain (KNEE PAIN). 60 tablet 0  . INS SYRINGE/NEEDLE 1CC/28G (B-D INSULIN SYRINGE 1CC/28G) 28G X 1/2" 1 ML MISC USE TO ADMINISTER INSULIN DAILY 100 each 0  . insulin lispro protamine-lispro (HUMALOG MIX 75/25) (75-25) 100 UNIT/ML SUSP injection Inject 20 units right before  breakfast and  25 units before dinner 10 mL 5  . latanoprost (XALATAN) 0.005 % ophthalmic solution INSTILL 1 DROP INTO BOTH EYES QD  5  . linagliptin (TRADJENTA) 5 MG TABS tablet Take 1 tablet (5 mg total) daily by mouth. 28 tablet 0  . metFORMIN (GLUCOPHAGE) 1000 MG tablet TAKE 1 TABLET BY MOUTH TWICE DAILY 180 tablet 1  . metoprolol succinate (TOPROL-XL) 100 MG 24 hr tablet Take 1 tablet (100 mg  total) by mouth daily. Take with or immediately following a meal. 90 tablet 3  . Multiple Vitamin (MULTIVITAMIN WITH MINERALS) TABS tablet Take 1 tablet by mouth daily.    . nitroGLYCERIN (NITROSTAT) 0.4 MG SL tablet Place 1 tablet (0.4 mg total) under the tongue every 5 (five) minutes as needed for chest pain. MAXIMUM 3 TABLETS 50 tablet 3  . ONE TOUCH ULTRA TEST test strip USE THREE TIMES DAILY AS DIRECTED 100 each 0  . ONETOUCH DELICA LANCETS 60V MISC Use three times daily to check blood sugar. 100 each 11  . pantoprazole (PROTONIX) 40 MG tablet TAKE 1 TABLET(40 MG) BY MOUTH TWICE DAILY BEFORE A MEAL 60 tablet 0  . tamsulosin (FLOMAX) 0.4 MG CAPS capsule TAKE 1 CAPSULE BY MOUTH EVERY DAY 90 capsule 1   No current facility-administered  medications on file prior to visit.     BP (!) 142/80   Pulse (!) 117   Ht '5\' 11"'  (1.803 m)   Wt 201 lb (91.2 kg)   SpO2 95%   BMI 28.03 kg/m       Objective:   Physical Exam  Constitutional: He is oriented to person, place, and time. He appears well-developed and well-nourished.  Eyes: Pupils are equal, round, and reactive to light. No scleral icterus.  Neck: Neck supple.  Cardiovascular: Normal rate and regular rhythm.  Pulmonary/Chest: Effort normal and breath sounds normal. He has no wheezes. He has no rales.  Abdominal: Soft. Bowel sounds are normal. He exhibits no fluid wave and no ascites. There is no CVA tenderness, no tenderness at McBurney's point and negative Murphy's sign.  Tenderness to palpation at Left quadrant (upper and lower).   Lymphadenopathy:    He has no cervical adenopathy.  Neurological: He is alert and oriented to person, place, and time.  Skin: Skin is warm and dry. No rash noted.      Assessment & Plan:  1. Abdominal pain, LLQ (left lower quadrant) Symptoms are concerning for diverticulitis and possible microperforation or abscess with exam, history and recent treatment for diverticulitis. Will send for urgent CT;  treatment will be determined by results. We reviewed the importance of imaging and subsequent treatment. Lab work was declined today; patient is agreeable to CT.  Return precautions discussed. Close follow up advised. He will go to the ED from this office and obtain imaging. He verbalized understanding and agreed with plan. - CT Abdomen Pelvis W Contrast; Future  2. Generalized abdominal pain   Delano Metz, FNP-C

## 2017-09-09 NOTE — Progress Notes (Signed)
  I have reviewed the above information and agree with above.   Ambry Dix, MD 

## 2017-09-14 ENCOUNTER — Other Ambulatory Visit: Payer: Self-pay | Admitting: Internal Medicine

## 2017-09-15 NOTE — Telephone Encounter (Signed)
Refilled: 08/01/2017 Last OV: 07/02/2017 Next OV: 10/03/2017

## 2017-09-30 ENCOUNTER — Other Ambulatory Visit: Payer: Self-pay | Admitting: Cardiovascular Disease

## 2017-09-30 ENCOUNTER — Other Ambulatory Visit: Payer: Self-pay | Admitting: Internal Medicine

## 2017-10-03 ENCOUNTER — Ambulatory Visit (INDEPENDENT_AMBULATORY_CARE_PROVIDER_SITE_OTHER): Payer: Medicare Other | Admitting: Internal Medicine

## 2017-10-03 ENCOUNTER — Encounter: Payer: Self-pay | Admitting: Internal Medicine

## 2017-10-03 VITALS — BP 134/62 | HR 67 | Temp 97.7°F | Resp 94 | Ht 71.0 in | Wt 203.8 lb

## 2017-10-03 DIAGNOSIS — I10 Essential (primary) hypertension: Secondary | ICD-10-CM | POA: Diagnosis not present

## 2017-10-03 DIAGNOSIS — E1151 Type 2 diabetes mellitus with diabetic peripheral angiopathy without gangrene: Secondary | ICD-10-CM | POA: Diagnosis not present

## 2017-10-03 DIAGNOSIS — Z8719 Personal history of other diseases of the digestive system: Secondary | ICD-10-CM | POA: Diagnosis not present

## 2017-10-03 DIAGNOSIS — M1712 Unilateral primary osteoarthritis, left knee: Secondary | ICD-10-CM

## 2017-10-03 DIAGNOSIS — K5792 Diverticulitis of intestine, part unspecified, without perforation or abscess without bleeding: Secondary | ICD-10-CM | POA: Diagnosis not present

## 2017-10-03 DIAGNOSIS — Z23 Encounter for immunization: Secondary | ICD-10-CM

## 2017-10-03 LAB — CBC WITH DIFFERENTIAL/PLATELET
BASOS ABS: 0.1 10*3/uL (ref 0.0–0.1)
Basophils Relative: 0.7 % (ref 0.0–3.0)
Eosinophils Absolute: 0.3 10*3/uL (ref 0.0–0.7)
Eosinophils Relative: 4.6 % (ref 0.0–5.0)
HEMATOCRIT: 34.6 % — AB (ref 39.0–52.0)
HEMOGLOBIN: 10.7 g/dL — AB (ref 13.0–17.0)
LYMPHS PCT: 36.4 % (ref 12.0–46.0)
Lymphs Abs: 2.7 10*3/uL (ref 0.7–4.0)
MCHC: 31 g/dL (ref 30.0–36.0)
MCV: 75.9 fl — AB (ref 78.0–100.0)
MONOS PCT: 9.2 % (ref 3.0–12.0)
Monocytes Absolute: 0.7 10*3/uL (ref 0.1–1.0)
NEUTROS PCT: 49.1 % (ref 43.0–77.0)
Neutro Abs: 3.6 10*3/uL (ref 1.4–7.7)
Platelets: 343 10*3/uL (ref 150.0–400.0)
RBC: 4.56 Mil/uL (ref 4.22–5.81)
RDW: 17.5 % — ABNORMAL HIGH (ref 11.5–15.5)
WBC: 7.3 10*3/uL (ref 4.0–10.5)

## 2017-10-03 LAB — COMPREHENSIVE METABOLIC PANEL
ALK PHOS: 54 U/L (ref 39–117)
ALT: 17 U/L (ref 0–53)
AST: 19 U/L (ref 0–37)
Albumin: 4.1 g/dL (ref 3.5–5.2)
BILIRUBIN TOTAL: 0.7 mg/dL (ref 0.2–1.2)
BUN: 19 mg/dL (ref 6–23)
CO2: 30 mEq/L (ref 19–32)
Calcium: 8.9 mg/dL (ref 8.4–10.5)
Chloride: 103 mEq/L (ref 96–112)
Creatinine, Ser: 1.12 mg/dL (ref 0.40–1.50)
GFR: 66.64 mL/min (ref >60.00)
GLUCOSE: 58 mg/dL — AB (ref 70–99)
Potassium: 5.2 mEq/L — ABNORMAL HIGH (ref 3.5–5.1)
Sodium: 137 mEq/L (ref 135–145)
TOTAL PROTEIN: 6.9 g/dL (ref 6.0–8.3)

## 2017-10-03 MED ORDER — HYDROCODONE-ACETAMINOPHEN 5-325 MG PO TABS: 1.0000 | ORAL_TABLET | Freq: Two times a day (BID) | ORAL | 0 refills | Status: DC | PRN

## 2017-10-03 MED ORDER — INSULIN LISPRO PROT & LISPRO (75-25 MIX) 100 UNIT/ML ~~LOC~~ SUSP: SUBCUTANEOUS | 5 refills | Status: DC

## 2017-10-03 NOTE — Patient Instructions (Addendum)
Try taking lactase  with your   yougurt to see if it prevents the diarrhea  Your blood sugars are still a little high on your current regimen so I recommend:  Increase the evening dose of insulin  to 27 units  .  Increase the morning dose  of insulin to 22 units    You received your final Pneumonia vaccine

## 2017-10-03 NOTE — Progress Notes (Signed)
Subjective:  Patient ID: Henry Lindsey, male    DOB: 30-Nov-1932  Age: 83 y.o. MRN: [id number]  CC: The primary encounter diagnosis was Essential hypertension. Diagnoses of Type 2 diabetes mellitus with diabetic peripheral angiopathy without gangrene, without long-term current use of insulin ([place]), Diverticulitis, Need for 23-polyvalent pneumococcal polysaccharide vaccine, History of diverticulitis, and Primary osteoarthritis of left knee were also pertinent to this visit.  HPI Henry Lindsey presents for 3 month follow up on  Chronic pain managed with vicodin,  And uncontrolled type 2 DM diabetes.  Patient has no complaints today.  Patient is following a low glycemic index diet about 50% of the time and taking all prescribed medications regularly without side effects.  Fasting sugars have been under less than 150 most of the time and post prandials have been under 200 except on rare occasions. He did not bring his log of BS with him.  He has been taking tradjent since his last visit with Deirdre Pippins, and he has increased insulin dose to  20 am in 25 at night   Patient is exercising about 3 times per week and intentionally trying to lose weight .  Patient has had an eye exam in the last 12 months and checks feet regularly for signs of infection.  Patient does not walk barefoot outside,  And denies any  numbness tingling or burning in feet. Patient is up to date on all recommended vaccinations  Treated for diverticulitis in November Confirmed with CT scan  descending colon.  Stools are still soft but has occasional solid stools. Avoiding nuts, eating more yogurt.  abd pain resolved.  Lab Results  Component Value Date   HGBA1C 9.0 (H) 08/06/2017     Lab Results  Component Value Date   WBC 7.3 10/03/2017   HGB 10.7 (L) 10/03/2017   HCT 34.6 (L) 10/03/2017   MCV 75.9 (L) 10/03/2017   PLT 343.0 10/03/2017      Outpatient Medications Prior to Visit  Medication Sig Dispense Refill  . Alum  & Mag Hydroxide-Simeth (MAGIC MOUTHWASH) SOLN Take 5 mLs by mouth 3 (three) times daily as needed for mouth pain.    [name] aspirin 81 MG tablet Take 1 tablet (81 mg total) by mouth daily.    [name] atorvastatin (LIPITOR) 40 MG tablet TAKE 1 TABLET BY MOUTH EVERY DAY 90 tablet 0  . Blood Glucose Monitoring Suppl (ONE TOUCH ULTRA SYSTEM KIT) w/Device KIT 1 kit by Does not apply route once. Use DX code E11.59 1 each 0  . Cholecalciferol (VITAMIN D3) 2000 units TABS Take 1 tablet by mouth daily.    . cilostazol (PLETAL) 100 MG tablet TAKE 1 TABLET(100 MG) BY MOUTH TWICE DAILY 60 tablet 0  . famotidine (PEPCID) 20 MG tablet TAKE 1 TABLET(20 MG) BY MOUTH TWICE DAILY 60 tablet 0  . fenofibrate micronized (LOFIBRA) 134 MG capsule TAKE 1 CAPSULE BY MOUTH EVERY MORNING BEFORE BREAKFAST 30 capsule 0  . gabapentin (NEURONTIN) 400 MG capsule TAKE 1 CAPSULE(400 MG) BY MOUTH THREE TIMES DAILY 270 capsule 0  . glipiZIDE (GLUCOTROL) 10 MG tablet Take 1 tablet (10 mg total) by mouth 2 (two) times daily before a meal. 60 tablet 4  . INS SYRINGE/NEEDLE 1CC/28G (B-D INSULIN SYRINGE 1CC/28G) 28G X 1/2" 1 ML MISC USE TO ADMINISTER INSULIN DAILY 100 each 0  . latanoprost (XALATAN) 0.005 % ophthalmic solution INSTILL 1 DROP INTO BOTH EYES QD  5  . linagliptin (TRADJENTA) 5 MG TABS tablet Take  1 tablet (5 mg total) daily by mouth. 28 tablet 0  . metFORMIN (GLUCOPHAGE) 1000 MG tablet TAKE 1 TABLET BY MOUTH TWICE DAILY 180 tablet 1  . metoprolol succinate (TOPROL-XL) 100 MG 24 hr tablet TAKE 1 TABLET BY MOUTH EVERY DAY WITH OR IMMEDIATELY FOLLOWING A MEAL 30 tablet 0  . Multiple Vitamin (MULTIVITAMIN WITH MINERALS) TABS tablet Take 1 tablet by mouth daily.    . nitroGLYCERIN (NITROSTAT) 0.4 MG SL tablet Place 1 tablet (0.4 mg total) under the tongue every 5 (five) minutes as needed for chest pain. MAXIMUM 3 TABLETS 50 tablet 3  . ONE TOUCH ULTRA TEST test strip USE THREE TIMES DAILY AS DIRECTED 100 each 0  . ONETOUCH DELICA LANCETS  62H MISC Use three times daily to check blood sugar. 100 each 11  . pantoprazole (PROTONIX) 40 MG tablet TAKE 1 TABLET(40 MG) BY MOUTH TWICE DAILY BEFORE A MEAL 60 tablet 0  . tamsulosin (FLOMAX) 0.4 MG CAPS capsule TAKE 1 CAPSULE BY MOUTH EVERY DAY 90 capsule 1  . HYDROcodone-acetaminophen (NORCO/VICODIN) 5-325 MG tablet Take 1 tablet by mouth 2 (two) times daily as needed for moderate pain (KNEE PAIN). 60 tablet 0  . insulin lispro protamine-lispro (HUMALOG MIX 75/25) (75-25) 100 UNIT/ML SUSP injection Inject 20 units right before  breakfast and  25 units before dinner 10 mL 5  . ciprofloxacin (CIPRO) 500 MG tablet Take 1 tablet (500 mg total) 2 (two) times daily by mouth. (Patient not taking: Reported on 10/03/2017) 14 tablet 0  . metroNIDAZOLE (FLAGYL) 500 MG tablet Take 1 tablet (500 mg total) 3 (three) times daily by mouth. (Patient not taking: Reported on 10/03/2017) 21 tablet 0   No facility-administered medications prior to visit.     Review of Systems;  Patient denies headache, fevers, malaise, unintentional weight loss, skin rash, eye pain, sinus congestion and sinus pain, sore throat, dysphagia,  hemoptysis , cough, dyspnea, wheezing, chest pain, palpitations, orthopnea, edema, abdominal pain, nausea, melena, diarrhea, constipation, flank pain, dysuria, hematuria, urinary  Frequency, nocturia, numbness, tingling, seizures,  Focal weakness, Loss of consciousness,  Tremor, insomnia, depression, anxiety, and suicidal ideation.      Objective:  BP 134/62 (BP Location: Left Arm, Patient Position: Sitting, Cuff Size: Normal)   Pulse 67   Temp 97.7 F (36.5 C) (Oral)   Resp (!) 94   Ht '5\' 11"'  (1.803 m)   Wt 203 lb 12.8 oz (92.4 kg)   HC 15" (38.1 cm)   BMI 28.42 kg/m   BP Readings from Last 3 Encounters:  10/03/17 134/62  09/08/17 (!) 142/80  09/08/17 (!) 142/80    Wt Readings from Last 3 Encounters:  10/03/17 203 lb 12.8 oz (92.4 kg)  09/08/17 201 lb (91.2 kg)  09/08/17  201 lb 4.8 oz (91.3 kg)    General appearance: alert, cooperative and appears stated age Ears: normal TM's and external ear canals both ears Throat: lips, mucosa, and tongue normal; teeth and gums normal Neck: no adenopathy, no carotid bruit, supple, symmetrical, trachea midline and thyroid not enlarged, symmetric, no tenderness/mass/nodules Back: symmetric, no curvature. ROM normal. No CVA tenderness. Lungs: clear to auscultation bilaterally Heart: regular rate and rhythm, S1, S2 normal, no murmur, click, rub or gallop Abdomen: soft, non-tender; bowel sounds normal; no masses,  no organomegaly Pulses: 2+ and symmetric Skin: Skin color, texture, turgor normal. No rashes or lesions Lymph nodes: Cervical, supraclavicular, and axillary nodes normal.  Lab Results  Component Value Date   HGBA1C 9.0 (  H) 08/06/2017   HGBA1C 8.3 (H) 04/10/2017   HGBA1C 6.3 01/02/2017    Lab Results  Component Value Date   CREATININE 1.12 10/03/2017   CREATININE 1.77 (H) 08/21/2017   CREATININE 1.12 08/06/2017    Lab Results  Component Value Date   WBC 7.3 10/03/2017   HGB 10.7 (L) 10/03/2017   HCT 34.6 (L) 10/03/2017   PLT 343.0 10/03/2017   GLUCOSE 58 (L) 10/03/2017   CHOL 102 08/06/2017   TRIG 99.0 08/06/2017   HDL 46.50 08/06/2017   LDLDIRECT 62.0 10/04/2016   LDLCALC 36 08/06/2017   ALT 17 10/03/2017   AST 19 10/03/2017   NA 137 10/03/2017   K 5.2 (H) 10/03/2017   CL 103 10/03/2017   CREATININE 1.12 10/03/2017   BUN 19 10/03/2017   CO2 30 10/03/2017   TSH 2.943 10/20/2016   PSA 0.83 10/10/2014   INR 1.22 11/21/2016   HGBA1C 9.0 (H) 08/06/2017   MICROALBUR 5.6 (H) 08/06/2017    Ct Abdomen Pelvis W Contrast  Result Date: 09/08/2017 CLINICAL DATA:  Worsening left lower quadrant pain for 12 hours. Recent diverticulitis treated with antibiotics. EXAM: CT ABDOMEN AND PELVIS WITH CONTRAST TECHNIQUE: Multidetector CT imaging of the abdomen and pelvis was performed using the standard  protocol following bolus administration of intravenous contrast. CONTRAST:  100 mL Isovue 300 COMPARISON:  08/21/2017 FINDINGS: Lower Chest: No acute findings. Stable bibasilar scarring and posterior lower lobe bronchiectasis, left side greater than right. Hepatobiliary: No hepatic masses identified. Prior cholecystectomy again noted, with stable tiny postop fluid collection adjacent to surgical clips in the gallbladder fossa. No evidence of biliary obstruction. Pancreas:  No mass or inflammatory changes. Spleen: Within normal limits in size and appearance. Adrenals/Urinary Tract: No masses identified. Stable small bilateral renal cysts. No evidence of hydronephrosis. Stomach/Bowel: Stable tiny hiatal hernia. Mild diverticulitis is seen involving the distal descending colon which is mildly increased since previous study. No evidence of abscess or extraluminal air. Vascular/Lymphatic: No pathologically enlarged lymph nodes. No abdominal aortic aneurysm. Aortic atherosclerosis. Reproductive:  Stable mildly enlarged prostate. Other:  None. Musculoskeletal: No suspicious bone lesions identified. Advanced degenerative lumbar spondylosis again noted, with stable grade 1 anterolisthesis at L4-5 measuring 8 mm. IMPRESSION: Increased mild diverticulitis involving distal descending colon since prior study. No evidence of abscess or other complication. Other incidental findings described above. Electronically Signed   By: Earle Gell M.D.   On: 09/08/2017 13:00    Assessment & Plan:   Problem List Items Addressed This Visit    Hypertension - Primary   Relevant Orders   Comprehensive metabolic panel (Completed)   History of diverticulitis    Episode confirmed with CT Nov 2018.  Leukocytosis and symptoms now resolved. .  Dietary changes reviewed.   Lab Results  Component Value Date   WBC 7.3 10/03/2017   HGB 10.7 (L) 10/03/2017   HCT 34.6 (L) 10/03/2017   MCV 75.9 (L) 10/03/2017   PLT 343.0 10/03/2017          Left knee DJD    Still severe despite prior knee replacement, right knee hurting as well.  Continue vicodin  Daily per narcotcis contract Refill history confirmed via [place] Controlled Substance database, accessed by me today..      Relevant Medications   HYDROcodone-acetaminophen (NORCO/VICODIN) 5-325 MG tablet   HYDROcodone-acetaminophen (NORCO/VICODIN) 5-325 MG tablet   T2DM (type 2 diabetes mellitus) ([place])    Uncontrolled by last a1c.  tradjenta started.  Increase insulin dose to 27  units in the evening and 22 in the morning .continue metformin and glipizide,  Asa and atorvastatin.   Lab Results  Component Value Date   HGBA1C 9.0 (H) 08/06/2017         Relevant Medications   insulin lispro protamine-lispro (HUMALOG MIX 75/25) (75-25) 100 UNIT/ML SUSP injection    Other Visit Diagnoses    Diverticulitis       Relevant Orders   CBC with Differential/Platelet (Completed)   Need for 23-polyvalent pneumococcal polysaccharide vaccine       Relevant Orders   Pneumococcal polysaccharide vaccine 23-valent greater than or equal to 2yo subcutaneous/IM (Completed)      I have discontinued Melvern P. Topping's ciprofloxacin and metroNIDAZOLE. I have also changed his insulin lispro protamine-lispro. Additionally, I am having him maintain his magic mouthwash, ONE TOUCH ULTRA SYSTEM KIT, ONETOUCH DELICA LANCETS 16X, latanoprost, INS SYRINGE/NEEDLE 1CC/28G, metFORMIN, fenofibrate micronized, nitroGLYCERIN, multivitamin with minerals, aspirin, Vitamin D3, tamsulosin, glipiZIDE, ONE TOUCH ULTRA TEST, atorvastatin, linagliptin, cilostazol, gabapentin, metoprolol succinate, pantoprazole, famotidine, HYDROcodone-acetaminophen, and HYDROcodone-acetaminophen.  Meds ordered this encounter  Medications  . insulin lispro protamine-lispro (HUMALOG MIX 75/25) (75-25) 100 UNIT/ML SUSP injection    Sig: Inject 22 units right before  breakfast and  27 units before dinner    Dispense:  10 mL    Refill:  5  .  DISCONTD: HYDROcodone-acetaminophen (NORCO/VICODIN) 5-325 MG tablet    Sig: Take 1 tablet by mouth 2 (two) times daily as needed for moderate pain (KNEE PAIN).    Dispense:  60 tablet    Refill:  0    May refill on or after  September 30 2017  . HYDROcodone-acetaminophen (NORCO/VICODIN) 5-325 MG tablet    Sig: Take 1 tablet by mouth 2 (two) times daily as needed for moderate pain (KNEE PAIN).    Dispense:  60 tablet    Refill:  0    May refill on or after  October 31 2017  . HYDROcodone-acetaminophen (NORCO/VICODIN) 5-325 MG tablet    Sig: Take 1 tablet by mouth 2 (two) times daily as needed for moderate pain (KNEE PAIN).    Dispense:  60 tablet    Refill:  0    May refill on or after  December 01 2017  A total of 25 minutes of face to face time was spent with patient more than half of which was spent in counselling about the above mentioned conditions  and coordination of care    Medications Discontinued During This Encounter  Medication Reason  . ciprofloxacin (CIPRO) 500 MG tablet Completed Course  . metroNIDAZOLE (FLAGYL) 500 MG tablet Completed Course  . insulin lispro protamine-lispro (HUMALOG MIX 75/25) (75-25) 100 UNIT/ML SUSP injection   . HYDROcodone-acetaminophen (NORCO/VICODIN) 5-325 MG tablet Reorder  . HYDROcodone-acetaminophen (NORCO/VICODIN) 5-325 MG tablet Reorder    Follow-up: No Follow-up on file.   [name], MD

## 2017-10-05 ENCOUNTER — Other Ambulatory Visit: Payer: Self-pay | Admitting: Internal Medicine

## 2017-10-05 ENCOUNTER — Encounter: Payer: Self-pay | Admitting: Internal Medicine

## 2017-10-05 DIAGNOSIS — E876 Hypokalemia: Secondary | ICD-10-CM

## 2017-10-05 DIAGNOSIS — Z8719 Personal history of other diseases of the digestive system: Secondary | ICD-10-CM | POA: Insufficient documentation

## 2017-10-05 DIAGNOSIS — D649 Anemia, unspecified: Secondary | ICD-10-CM

## 2017-10-05 NOTE — Assessment & Plan Note (Signed)
Still severe despite prior knee replacement, right knee hurting as well.  Continue vicodin  Daily per narcotcis contract Refill history confirmed via [place] Controlled Substance database, accessed by me today.[name]

## 2017-10-05 NOTE — Assessment & Plan Note (Signed)
Uncontrolled by last a1c.  tradjenta started.  Increase insulin dose to 27 units in the evening and 22 in the morning .continue metformin and glipizide,  Asa and atorvastatin.   Lab Results  Component Value Date   HGBA1C 9.0 (H) 08/06/2017

## 2017-10-05 NOTE — Assessment & Plan Note (Addendum)
Episode confirmed with CT Nov 2018.  Leukocytosis and symptoms now resolved. .  Dietary changes reviewed.   Lab Results  Component Value Date   WBC 7.3 10/03/2017   HGB 10.7 (L) 10/03/2017   HCT 34.6 (L) 10/03/2017   MCV 75.9 (L) 10/03/2017   PLT 343.0 10/03/2017

## 2017-10-07 ENCOUNTER — Other Ambulatory Visit (INDEPENDENT_AMBULATORY_CARE_PROVIDER_SITE_OTHER): Payer: Medicare Other

## 2017-10-07 ENCOUNTER — Telehealth: Payer: Self-pay | Admitting: Internal Medicine

## 2017-10-07 DIAGNOSIS — D509 Iron deficiency anemia, unspecified: Secondary | ICD-10-CM

## 2017-10-07 DIAGNOSIS — D649 Anemia, unspecified: Secondary | ICD-10-CM

## 2017-10-07 DIAGNOSIS — E876 Hypokalemia: Secondary | ICD-10-CM

## 2017-10-07 NOTE — Addendum Note (Signed)
Addended by: Arby Barrette on: 10/07/2017 02:10 PM   Modules accepted: Orders

## 2017-10-07 NOTE — Telephone Encounter (Signed)
PT dropped off Blood sugar results. Paper is up front in Dr. Lupita Dawn color folder.

## 2017-10-08 LAB — FOLATE RBC: RBC FOLATE: 947 ng/mL (ref >280)

## 2017-10-08 LAB — IRON,TIBC AND FERRITIN PANEL
%SAT: 6 % (calc) — ABNORMAL LOW (ref 15–60)
Ferritin: 16 ng/mL — ABNORMAL LOW (ref 20–380)
IRON: 26 ug/dL — AB (ref 50–180)
TIBC: 435 ug/dL — AB (ref 250–425)

## 2017-10-09 DIAGNOSIS — D509 Iron deficiency anemia, unspecified: Secondary | ICD-10-CM | POA: Insufficient documentation

## 2017-10-09 MED ORDER — FERROUS FUM-IRON POLYSACCH 162-115.2 MG PO CAPS: 1.0000 | ORAL_CAPSULE | Freq: Every day | ORAL | 3 refills | Status: DC

## 2017-10-09 NOTE — Assessment & Plan Note (Signed)
Iron supplement recommended.

## 2017-10-09 NOTE — Addendum Note (Signed)
Addended by: [name] on: 10/09/2017 12:26 PM   Modules accepted: Orders

## 2017-10-10 ENCOUNTER — Telehealth: Payer: Self-pay | Admitting: *Deleted

## 2017-10-10 ENCOUNTER — Telehealth: Payer: Self-pay | Admitting: Internal Medicine

## 2017-10-10 LAB — BASIC METABOLIC PANEL
BUN / CREAT RATIO: 16 (calc) (ref 6–22)
BUN: 25 mg/dL (ref 7–25)
CO2: 18 mmol/L — ABNORMAL LOW (ref 20–32)
CREATININE: 1.56 mg/dL — AB (ref 0.70–1.11)
Calcium: 10.5 mg/dL — ABNORMAL HIGH (ref 8.6–10.3)
Chloride: 134 mmol/L — ABNORMAL HIGH (ref 98–110)
GLUCOSE: 181 mg/dL — AB (ref 65–99)
Potassium: 6.3 mmol/L (ref 3.5–5.3)
Sodium: >165 mmol/L (ref 135–146)

## 2017-10-10 LAB — VITAMIN B12: Vitamin B-12: 637 pg/mL (ref 200–1100)

## 2017-10-10 NOTE — Telephone Encounter (Signed)
Yes, they are very abnormal ,  Not at all like the previous.  Ones .  How soon will we have an answer?

## 2017-10-10 NOTE — Telephone Encounter (Signed)
Placed in results folder.  

## 2017-10-10 NOTE — Telephone Encounter (Signed)
Caller name: Grayland Ormond w/ [facility] Can be reached: [phone] ext [phone] Last visit: 10/03/17  Reason for call: Preliminary results for BMP show high potatssium, sodium, and chloride. Grayland Ormond was concerned because dx was put in as hypokalemia and this made him question the validity of results. Informed Grayland Ormond that pt's last labs showed hyperkalemia (not hypo) and that this was the reason for repeating. Grayland Ormond will repeat the labs to verify and then release once complete.

## 2017-10-10 NOTE — Telephone Encounter (Signed)
Critical result values from [facility]:Eri Potassium 6.3 Sodium   >165

## 2017-10-11 ENCOUNTER — Other Ambulatory Visit: Payer: Self-pay | Admitting: Internal Medicine

## 2017-10-11 DIAGNOSIS — E875 Hyperkalemia: Secondary | ICD-10-CM

## 2017-10-13 ENCOUNTER — Other Ambulatory Visit (INDEPENDENT_AMBULATORY_CARE_PROVIDER_SITE_OTHER): Payer: Medicare Other

## 2017-10-13 DIAGNOSIS — E875 Hyperkalemia: Secondary | ICD-10-CM

## 2017-10-13 LAB — BASIC METABOLIC PANEL
BUN: 17 mg/dL (ref 6–23)
CO2: 27 meq/L (ref 19–32)
Calcium: 8.7 mg/dL (ref 8.4–10.5)
Chloride: 106 mEq/L (ref 96–112)
Creatinine, Ser: 1.04 mg/dL (ref 0.40–1.50)
GFR: 72.59 mL/min (ref >60.00)
GLUCOSE: 134 mg/dL — AB (ref 70–99)
Potassium: 4.4 mEq/L (ref 3.5–5.1)
Sodium: 140 mEq/L (ref 135–145)

## 2017-10-13 NOTE — Telephone Encounter (Signed)
The labs were repeated prior to being released to Epic.

## 2017-10-13 NOTE — Telephone Encounter (Signed)
Please advise 

## 2017-10-27 ENCOUNTER — Other Ambulatory Visit: Payer: Self-pay | Admitting: Internal Medicine

## 2017-11-08 ENCOUNTER — Other Ambulatory Visit: Payer: Self-pay | Admitting: Internal Medicine

## 2017-11-21 ENCOUNTER — Telehealth: Payer: Self-pay

## 2017-11-21 NOTE — Telephone Encounter (Signed)
Copied from [place] [id number]. Topic: Appointment Scheduling - Scheduling Inquiry for Clinic >> Nov 21, 2017  2:19 PM Margot Ables wrote: Reason for CRM: pt called stating he is needing appt for med refill on hydrocodone before 12/29/16 with Dr. Derrel Nip - per notes pt requires a 30 min slot - please f/u with pt to schedule

## 2017-11-26 NOTE — Telephone Encounter (Signed)
Patient scheduled.

## 2017-11-27 ENCOUNTER — Other Ambulatory Visit: Payer: Self-pay | Admitting: Internal Medicine

## 2017-12-02 ENCOUNTER — Telehealth: Payer: Self-pay | Admitting: Internal Medicine

## 2017-12-02 ENCOUNTER — Encounter: Payer: Self-pay | Admitting: Internal Medicine

## 2017-12-02 ENCOUNTER — Ambulatory Visit: Payer: Medicare Other | Admitting: Internal Medicine

## 2017-12-02 VITALS — BP 148/76 | HR 90 | Temp 98.2°F | Ht 71.0 in | Wt 202.4 lb

## 2017-12-02 DIAGNOSIS — G8929 Other chronic pain: Secondary | ICD-10-CM

## 2017-12-02 DIAGNOSIS — J441 Chronic obstructive pulmonary disease with (acute) exacerbation: Secondary | ICD-10-CM | POA: Diagnosis not present

## 2017-12-02 DIAGNOSIS — E1165 Type 2 diabetes mellitus with hyperglycemia: Secondary | ICD-10-CM

## 2017-12-02 DIAGNOSIS — E119 Type 2 diabetes mellitus without complications: Secondary | ICD-10-CM | POA: Diagnosis not present

## 2017-12-02 DIAGNOSIS — M25561 Pain in right knee: Secondary | ICD-10-CM

## 2017-12-02 DIAGNOSIS — E1151 Type 2 diabetes mellitus with diabetic peripheral angiopathy without gangrene: Secondary | ICD-10-CM

## 2017-12-02 DIAGNOSIS — J069 Acute upper respiratory infection, unspecified: Secondary | ICD-10-CM | POA: Diagnosis not present

## 2017-12-02 MED ORDER — ALBUTEROL SULFATE HFA 108 (90 BASE) MCG/ACT IN AERS: 1.0000 | INHALATION_SPRAY | Freq: Four times a day (QID) | RESPIRATORY_TRACT | 0 refills | Status: DC | PRN

## 2017-12-02 MED ORDER — DOXYCYCLINE HYCLATE 100 MG PO TABS: 100.0000 mg | ORAL_TABLET | Freq: Two times a day (BID) | ORAL | 0 refills | Status: DC

## 2017-12-02 MED ORDER — LINAGLIPTIN 5 MG PO TABS: 5.0000 mg | ORAL_TABLET | Freq: Every day | ORAL | 1 refills | Status: DC

## 2017-12-02 MED ORDER — HYDROCODONE-HOMATROPINE 5-1.5 MG/5ML PO SYRP: 5.0000 mL | ORAL_SOLUTION | Freq: Every evening | ORAL | 0 refills | Status: DC | PRN

## 2017-12-02 NOTE — Patient Instructions (Addendum)
F/u in 1 week if not better otherwise as scheduled with Dr. Rolland Bimler 12/29/17   Chronic Obstructive Pulmonary Disease Chronic obstructive pulmonary disease (COPD) is a long-term (chronic) lung problem. When you have COPD, it is hard for air to get in and out of your lungs. The way your lungs work will never return to normal. Usually the condition gets worse over time. There are things you can do to keep yourself as healthy as possible. Your doctor may treat your condition with:  Medicines.  Quitting smoking, if you smoke.  Rehabilitation. This may involve a team of specialists.  Oxygen.  Exercise and changes to your diet.  Lung surgery.  Comfort measures (palliative care).  Follow these instructions at home: Medicines  Take over-the-counter and prescription medicines only as told by your doctor.  Talk to your doctor before taking any cough or allergy medicines. You may need to avoid medicines that cause your lungs to be dry. Lifestyle  If you smoke, stop. Smoking makes the problem worse. If you need help quitting, ask your doctor.  Avoid being around things that make your breathing worse. This may include smoke, chemicals, and fumes.  Stay active, but remember to also rest.  Learn and use tips on how to relax.  Make sure you get enough sleep. Most adults need at least 7 hours a night.  Eat healthy foods. Eat smaller meals more often. Rest before meals. Controlled breathing  Learn and use tips on how to control your breathing as told by your doctor. Try: ? Breathing in (inhaling) through your nose for 1 second. Then, pucker your lips and breath out (exhale) through your lips for 2 seconds. ? Putting one hand on your belly (abdomen). Breathe in slowly through your nose for 1 second. Your hand on your belly should move out. Pucker your lips and breathe out slowly through your lips. Your hand on your belly should move in as you breathe out. Controlled coughing  Learn and use  controlled coughing to clear mucus from your lungs. The steps are: 1. Lean your head a little forward. 2. Breathe in deeply. 3. Try to hold your breath for 3 seconds. 4. Keep your mouth slightly open while coughing 2 times. 5. Spit any mucus out into a tissue. 6. Rest and do the steps again 1 or 2 times as needed. General instructions  Make sure you get all the shots (vaccines) that your doctor recommends. Ask your doctor about a flu shot and a pneumonia shot.  Use oxygen therapy and therapy to help improve your lungs (pulmonary rehabilitation) if told by your doctor. If you need home oxygen therapy, ask your doctor if you should buy a tool to measure your oxygen level (oximeter).  Make a COPD action plan with your doctor. This helps you know what to do if you feel worse than usual.  Manage any other conditions you have as told by your doctor.  Avoid going outside when it is very hot, cold, or humid.  Avoid people who have a sickness you can catch (contagious).  Keep all follow-up visits as told by your doctor. This is important. Contact a doctor if:  You cough up more mucus than usual.  There is a change in the color or thickness of the mucus.  It is harder to breathe than usual.  Your breathing is faster than usual.  You have trouble sleeping.  You need to use your medicines more often than usual.  You have trouble doing your  normal activities such as getting dressed or walking around the house. Get help right away if:  You have shortness of breath while resting.  You have shortness of breath that stops you from: ? Being able to talk. ? Doing normal activities.  Your chest hurts for longer than 5 minutes.  Your skin color is more blue than usual.  Your pulse oximeter shows that you have low oxygen for longer than 5 minutes.  You have a fever.  You feel too tired to breathe normally. Summary  Chronic obstructive pulmonary disease (COPD) is a long-term lung  problem.  The way your lungs work will never return to normal. Usually the condition gets worse over time. There are things you can do to keep yourself as healthy as possible.  Take over-the-counter and prescription medicines only as told by your doctor.  If you smoke, stop. Smoking makes the problem worse. This information is not intended to replace advice given to you by your health care provider. Make sure you discuss any questions you have with your health care provider. Document Released: 03/25/2008 Document Revised: 03/14/2016 Document Reviewed: 06/03/2013 Elsevier Interactive Patient Education  2017 Elsevier Inc.  Cough, Adult Coughing is a reflex that clears your throat and your airways. Coughing helps to heal and protect your lungs. It is normal to cough occasionally, but a cough that happens with other symptoms or lasts a long time may be a sign of a condition that needs treatment. A cough may last only 2-3 weeks (acute), or it may last longer than 8 weeks (chronic). What are the causes? Coughing is commonly caused by:  Breathing in substances that irritate your lungs.  A viral or bacterial respiratory infection.  Allergies.  Asthma.  Postnasal drip.  Smoking.  Acid backing up from the stomach into the esophagus (gastroesophageal reflux).  Certain medicines.  Chronic lung problems, including COPD (or rarely, lung cancer).  Other medical conditions such as heart failure.  Follow these instructions at home: Pay attention to any changes in your symptoms. Take these actions to help with your discomfort:  Take medicines only as told by your health care provider. ? If you were prescribed an antibiotic medicine, take it as told by your health care provider. Do not stop taking the antibiotic even if you start to feel better. ? Talk with your health care provider before you take a cough suppressant medicine.  Drink enough fluid to keep your urine clear or pale  yellow.  If the air is dry, use a cold steam vaporizer or humidifier in your bedroom or your home to help loosen secretions.  Avoid anything that causes you to cough at work or at home.  If your cough is worse at night, try sleeping in a semi-upright position.  Avoid cigarette smoke. If you smoke, quit smoking. If you need help quitting, ask your health care provider.  Avoid caffeine.  Avoid alcohol.  Rest as needed.  Contact a health care provider if:  You have new symptoms.  You cough up pus.  Your cough does not get better after 2-3 weeks, or your cough gets worse.  You cannot control your cough with suppressant medicines and you are losing sleep.  You develop pain that is getting worse or pain that is not controlled with pain medicines.  You have a fever.  You have unexplained weight loss.  You have night sweats. Get help right away if:  You cough up blood.  You have difficulty breathing.  Your  heartbeat is very fast. This information is not intended to replace advice given to you by your health care provider. Make sure you discuss any questions you have with your health care provider. Document Released: 04/05/2011 Document Revised: 03/14/2016 Document Reviewed: 12/14/2014 Elsevier Interactive Patient Education  [employer].

## 2017-12-02 NOTE — Telephone Encounter (Signed)
Copied from [place] [id number]. Topic: Quick Communication - Rx Refill/Question >> Dec 02, 2017 11:42 AM Scherrie Gerlach wrote: Medication: albuterol (PROVENTIL HFA;VENTOLIN HFA) 108 (90 Base) MCG/ACT inhaler [facility] called to ask if OK for pt not to get the inhaler. [facility] states the cheapest one pt can get is $51 and pt declined.  They want the dr to know.  Also pharmacist wanted to verify the father is the one that was seen today.  They have had an issue in the past with the son switching out controlled med and they questioned the cough med.  [place] [address] [place] [phone] (Phone) [phone] (Fax)

## 2017-12-02 NOTE — Telephone Encounter (Signed)
Spoke with [facility] and the script for the antibiotic has been filled , verified that the script was patients so he can get cough syrup.   Patient advised of below .  He states he doesn't need the inhaler .  The tradjenta wasn't at [facility] so I verbally called in script .  Patient advised of scripts so he can pick up.

## 2017-12-02 NOTE — Progress Notes (Signed)
Pre visit review using our clinic review tool, if applicable. No additional management support is needed unless otherwise documented below in the visit note. 

## 2017-12-02 NOTE — Telephone Encounter (Signed)
Copied from [place] [id number]. Topic: Quick Communication - Rx Refill/Question >> Dec 02, 2017 12:07 PM [place], Claiborne [place], [place] wrote: Patient called back again and stated that he is at the [facility] [place] [phone] now and would like for someone at the office to call the pharmacy to verify his medication Doxycycline. Also stated he doesn't want to wait all day there.

## 2017-12-02 NOTE — Telephone Encounter (Signed)
He may have gone too soon and they needed time to get it ready call [facility] [address]  Sent antibiotic there and inhaler And sent Tradjenta to [facility]  I would prefer he get inhaler but if doesn't want to get 2/2 expense ok  Advise [facility] them I saw the father 84 years old   [place]

## 2017-12-02 NOTE — Progress Notes (Signed)
Chief Complaint  Patient presents with  . Cough    chest congestion   Sick visit c/o cough and chest congestion  x 2 weeks mucous green, light brown, clear no sick contacts. He reports Mucinex has not helped in the past and sudafed not helping. No wheezing, no fever, sl sob with exertion. CT 10/05/16 reviewed +mild COPD changes he is former smoker quit age 84, CXR 08/21/17 increased aeration   DM 2 he wants refill of Tradjenta was given sample last visit and he liked the medication. He does report he is in the donut hole.   S/p left total knee and reports has appt 02/2018 for right total knee to decide if needs surgery     Review of Systems  Constitutional: Negative for fever.  HENT: Negative for hearing loss.   Eyes: Negative for blurred vision.  Respiratory: Positive for cough and sputum production. Negative for wheezing.   Cardiovascular: Negative for chest pain.  Musculoskeletal: Negative for myalgias.  Skin: Negative for rash.  Neurological: Negative for headaches.   Past Medical History:  Diagnosis Date  . BPH (benign prostatic hyperplasia)   . Carotid artery stenosis 11/2010   <50% bilaterally  . COPD (chronic obstructive pulmonary disease) ([place])    noted CT 10/05/16 former smoker quit age 8   . CVA (cerebral infarction) 11/2010   r thalamic lacunar  . Diabetes mellitus   . GERD (gastroesophageal reflux disease)   . Hyperlipidemia   . Hypertension   . Neuromuscular disorder ([place])   . Peripheral vascular disease in diabetes mellitus ([place]) 06/2012    95% occlusion s/p PTCA R SFA Dew Sept 2013  . Stroke [facility])    Past Surgical History:  Procedure Laterality Date  . CHOLECYSTECTOMY N/A 11/13/2016   Procedure: LAPAROSCOPIC CHOLECYSTECTOMY WITH INTRAOPERATIVE CHOLANGIOGRAM;  Surgeon: Olean Ree, MD;  Location: [facility] ORS;  Service: General;  Laterality: N/A;  . ERCP N/A 11/17/2016   Procedure: ENDOSCOPIC RETROGRADE CHOLANGIOPANCREATOGRAPHY (ERCP);  Surgeon: Irene Shipper,  MD;  Location: [facility] ENDOSCOPY;  Service: Endoscopy;  Laterality: N/A;  . ERCP N/A 02/04/2017   Procedure: ENDOSCOPIC RETROGRADE CHOLANGIOPANCREATOGRAPHY (ERCP) Stent removal;  Surgeon: Lucilla Lame, MD;  Location: [facility] ENDOSCOPY;  Service: Endoscopy;  Laterality: N/A;  . IR GENERIC HISTORICAL  10/06/2016   IR PERC CHOLECYSTOSTOMY 10/06/2016 Aletta Edouard, MD [facility]-INTER[facility]  . JOINT REPLACEMENT Left 2014   left knee  . UPPER GASTROINTESTINAL ENDOSCOPY    . WRIST SURGERY     Family History  Problem Relation Age of Onset  . Diabetes Mother    Social History   Socioeconomic History  . Marital status: Widowed    Spouse name: Enid Derry, deceased  . Number of children: 2  . Years of education: 20  . Highest education level: Not on file  Social Needs  . Financial resource strain: Not on file  . Food insecurity - worry: Not on file  . Food insecurity - inability: Not on file  . Transportation needs - medical: Not on file  . Transportation needs - non-medical: Not on file  Occupational History    Employer: retired  Tobacco Use  . Smoking status: Former Smoker    Packs/day: 0.50    Years: 33.00    Pack years: 16.50    Types: Cigarettes    Start date: 07[birth year]09-1935    Last attempt to quit: 04/21/1968    Years since quitting: 49.6  . Smokeless tobacco: Never Used  Substance and Sexual Activity  . Alcohol use:  No  . Drug use: No  . Sexual activity: No    Partners: Female    Birth control/protection: None  Other Topics Concern  . Not on file  Social History Narrative   Widowed in 2014; dating again    Current Meds  Medication Sig  . Alum & Mag Hydroxide-Simeth (MAGIC MOUTHWASH) SOLN Take 5 mLs by mouth 3 (three) times daily as needed for mouth pain.  [name] aspirin 81 MG tablet Take 1 tablet (81 mg total) by mouth daily.  [name] atorvastatin (LIPITOR) 40 MG tablet TAKE 1 TABLET BY MOUTH EVERY DAY  . Blood Glucose Monitoring Suppl (ONE TOUCH ULTRA SYSTEM KIT) w/Device KIT 1 kit by Does not apply  route once. Use DX code E11.59  . Cholecalciferol (VITAMIN D3) 2000 units TABS Take 1 tablet by mouth daily.  . cilostazol (PLETAL) 100 MG tablet TAKE 1 TABLET(100 MG) BY MOUTH TWICE DAILY  . famotidine (PEPCID) 20 MG tablet TAKE 1 TABLET(20 MG) BY MOUTH TWICE DAILY  . fenofibrate micronized (LOFIBRA) 134 MG capsule TAKE 1 CAPSULE BY MOUTH EVERY MORNING BEFORE BREAKFAST  . ferrous fumarate-iron polysaccharide complex (TANDEM) 162-115.2 MG CAPS capsule Take 1 capsule by mouth daily. With orange juice or vitamin C  . gabapentin (NEURONTIN) 400 MG capsule TAKE 1 CAPSULE(400 MG) BY MOUTH THREE TIMES DAILY  . glipiZIDE (GLUCOTROL) 10 MG tablet TAKE 1 TABLET(10 MG) BY MOUTH TWICE DAILY BEFORE A MEAL  . HYDROcodone-acetaminophen (NORCO/VICODIN) 5-325 MG tablet Take 1 tablet by mouth 2 (two) times daily as needed for moderate pain (KNEE PAIN).  [name] HYDROcodone-acetaminophen (NORCO/VICODIN) 5-325 MG tablet Take 1 tablet by mouth 2 (two) times daily as needed for moderate pain (KNEE PAIN).  . INS SYRINGE/NEEDLE 1CC/28G (B-D INSULIN SYRINGE 1CC/28G) 28G X 1/2" 1 ML MISC USE TO ADMINISTER INSULIN DAILY  . insulin lispro protamine-lispro (HUMALOG MIX 75/25) (75-25) 100 UNIT/ML SUSP injection Inject 22 units right before  breakfast and  27 units before dinner  . latanoprost (XALATAN) 0.005 % ophthalmic solution INSTILL 1 DROP INTO BOTH EYES QD  . linagliptin (TRADJENTA) 5 MG TABS tablet Take 1 tablet (5 mg total) by mouth daily.  . metFORMIN (GLUCOPHAGE) 1000 MG tablet TAKE 1 TABLET BY MOUTH TWICE DAILY  . metFORMIN (GLUCOPHAGE) 1000 MG tablet TAKE 1 TABLET BY MOUTH TWICE DAILY  . metoprolol succinate (TOPROL-XL) 100 MG 24 hr tablet TAKE 1 TABLET BY MOUTH EVERY DAY WITH OR IMMEDIATELY FOLLOWING A MEAL  . Multiple Vitamin (MULTIVITAMIN WITH MINERALS) TABS tablet Take 1 tablet by mouth daily.  . nitroGLYCERIN (NITROSTAT) 0.4 MG SL tablet Place 1 tablet (0.4 mg total) under the tongue every 5 (five) minutes as needed  for chest pain. MAXIMUM 3 TABLETS  . ONE TOUCH ULTRA TEST test strip USE THREE TIMES DAILY AS DIRECTED  . ONETOUCH DELICA LANCETS 82X MISC Use three times daily to check blood sugar.  . pantoprazole (PROTONIX) 40 MG tablet TAKE 1 TABLET(40 MG) BY MOUTH TWICE DAILY BEFORE A MEAL  . tamsulosin (FLOMAX) 0.4 MG CAPS capsule TAKE 1 CAPSULE BY MOUTH EVERY DAY  . tamsulosin (FLOMAX) 0.4 MG CAPS capsule TAKE 1 CAPSULE(0.4 MG) BY MOUTH DAILY  . [DISCONTINUED] linagliptin (TRADJENTA) 5 MG TABS tablet Take 1 tablet (5 mg total) daily by mouth.   No Known Allergies Recent Results (from the past 2160 hour(s))  CBC with Differential/Platelet     Status: Abnormal   Collection Time: 10/03/17  2:59 PM  Result Value Ref Range   WBC 7.3  4.0 - 10.5 K/uL   RBC 4.56 4.22 - 5.81 Mil/uL   Hemoglobin 10.7 (L) 13.0 - 17.0 g/dL   HCT 34.6 (L) 39.0 - 52.0 %   MCV 75.9 (L) 78.0 - 100.0 fl   MCHC 31.0 30.0 - 36.0 g/dL   RDW 17.5 (H) 11.5 - 15.5 %   Platelets 343.0 150.0 - 400.0 K/uL   Neutrophils Relative % 49.1 43.0 - 77.0 %   Lymphocytes Relative 36.4 12.0 - 46.0 %   Monocytes Relative 9.2 3.0 - 12.0 %   Eosinophils Relative 4.6 0.0 - 5.0 %   Basophils Relative 0.7 0.0 - 3.0 %   Neutro Abs 3.6 1.4 - 7.7 K/uL   Lymphs Abs 2.7 0.7 - 4.0 K/uL   Monocytes Absolute 0.7 0.1 - 1.0 K/uL   Eosinophils Absolute 0.3 0.0 - 0.7 K/uL   Basophils Absolute 0.1 0.0 - 0.1 K/uL  Comprehensive metabolic panel     Status: Abnormal   Collection Time: 10/03/17  2:59 PM  Result Value Ref Range   Sodium 137 135 - 145 mEq/L   Potassium 5.2 (H) 3.5 - 5.1 mEq/L   Chloride 103 96 - 112 mEq/L   CO2 30 19 - 32 mEq/L   Glucose, Bld 58 (L) 70 - 99 mg/dL   BUN 19 6 - 23 mg/dL   Creatinine, Ser 1.12 0.40 - 1.50 mg/dL   Total Bilirubin 0.7 0.2 - 1.2 mg/dL   Alkaline Phosphatase 54 39 - 117 U/L   AST 19 0 - 37 U/L   ALT 17 0 - 53 U/L   Total Protein 6.9 6.0 - 8.3 g/dL   Albumin 4.1 3.5 - 5.2 g/dL   Calcium 8.9 8.4 - 10.5 mg/dL   GFR  66.64 >60.00 mL/min  Iron, TIBC and Ferritin Panel     Status: Abnormal   Collection Time: 10/07/17  2:04 PM  Result Value Ref Range   Iron 26 (L) 50 - 180 mcg/dL   TIBC 435 (H) 250 - 425 mcg/dL (calc)   %SAT 6 (L) 15 - 60 % (calc)   Ferritin 16 (L) 20 - 380 ng/mL  Folate RBC     Status: None   Collection Time: 10/07/17  2:04 PM  Result Value Ref Range   RBC Folate 947 >280 ng/mL RBC  Vitamin B12     Status: None   Collection Time: 10/07/17  2:10 PM  Result Value Ref Range   Vitamin B-12 637 200 - 1,100 pg/mL  Basic Metabolic Panel (BMET)     Status: Abnormal   Collection Time: 10/07/17  2:10 PM  Result Value Ref Range   Glucose, Bld 181 (H) 65 - 99 mg/dL    Comment: .            Fasting reference interval . For someone without known diabetes, a glucose value >125 mg/dL indicates that they may have diabetes and this should be confirmed with a follow-up test. .    BUN 25 7 - 25 mg/dL   Creat 1.56 (H) 0.70 - 1.11 mg/dL    Comment: For patients >4 years of age, the reference limit for Creatinine is approximately 13% higher for people identified as African-American. [name]    BUN/Creatinine Ratio 16 6 - 22 (calc)   Sodium >165 (HH) 135 - 146 mmol/L    Comment: if results do not correlate to clinical picture. recollect Verified by repeat analysis. .    Potassium 6.3 (HH) 3.5 - 5.3 mmol/L  Comment: if results do not correlate to clinical picture, recollect Verified by repeat analysis. .    Chloride 134 (H) 98 - 110 mmol/L    Comment: if results do not correlate to clinical picture, recollect Verified by repeat analysis. .    CO2 18 (L) 20 - 32 mmol/L   Calcium 10.5 (H) 8.6 - 10.3 mg/dL  Basic metabolic panel     Status: Abnormal   Collection Time: 10/13/17  8:50 AM  Result Value Ref Range   Sodium 140 135 - 145 mEq/L   Potassium 4.4 3.5 - 5.1 mEq/L   Chloride 106 96 - 112 mEq/L   CO2 27 19 - 32 mEq/L   Glucose, Bld 134 (H) 70 - 99 mg/dL   BUN 17 6 - 23 mg/dL    Creatinine, Ser 1.04 0.40 - 1.50 mg/dL   Calcium 8.7 8.4 - 10.5 mg/dL   GFR 72.59 >60.00 mL/min   Objective  Body mass index is 28.23 kg/m. Wt Readings from Last 3 Encounters:  12/02/17 202 lb 6.4 oz (91.8 kg)  10/03/17 203 lb 12.8 oz (92.4 kg)  09/08/17 201 lb (91.2 kg)   Temp Readings from Last 3 Encounters:  12/02/17 98.2 F (36.8 C) (Oral)  10/03/17 97.7 F (36.5 C) (Oral)  09/08/17 98.7 F (37.1 C)   BP Readings from Last 3 Encounters:  12/02/17 (!) 148/76  10/03/17 134/62  09/08/17 (!) 142/80   Pulse Readings from Last 3 Encounters:  12/02/17 90  10/03/17 67  09/08/17 (!) 117   O2 sat 95% room air  Physical Exam  Constitutional: He is oriented to person, place, and time and well-developed, well-nourished, and in no distress.  HENT:  Head: Normocephalic and atraumatic.  Eyes: Conjunctivae are normal. Pupils are equal, round, and reactive to light.  Cardiovascular: Normal rate, regular rhythm and normal heart sounds.  Pulmonary/Chest: Effort normal and breath sounds normal. He has no wheezes.  Neurological: He is alert and oriented to person, place, and time. Gait normal.  Skin: Skin is warm and dry.  Psychiatric: Mood, memory, affect and judgment normal.  Nursing note and vitals reviewed.   Assessment   1. Cough likely URI with mild COPD exacerbation with with CT h/o mild COPD  2. DM 2  3. Knee pain  Plan  1. Doxy bid x 1 week, rec mucinex/robitussin, prn hydcodan qhs 5 ml qhs, ventolin prn  Will not Rx steroids at this time no wheezing on exam  RTC in 1 week if not better  Supportive care handout given  2. Refilled tradjenta given good Rx card  3. appt ortho 02/2018   Provider: Dr. Olivia Mackie McLean-Scocuzza-Internal Medicine

## 2017-12-02 NOTE — Telephone Encounter (Signed)
Ok thanks you called in tradjenta 5 mg 1 pill in am #90 RF x 1   [place]

## 2017-12-03 NOTE — Telephone Encounter (Signed)
Yes, patient aware

## 2017-12-08 NOTE — Telephone Encounter (Signed)
Relation to pt: self  Call back number:[phone] Pharmacy: [facility] [place], [place] [phone] (Fax)     Reason for call:  Patient wanted to inform doxycycline (VIBRA-TABS) 100 MG tablet "improved congestion a little but didn't knock it out" , patient requesting 1 more round of a stronger antibiotic"  please advise

## 2017-12-08 NOTE — Telephone Encounter (Signed)
Scheduled patient for:30 on 12/09/16 for continued congestion cough saw dr. Aundra Dubin on 12/02/17 prescribed Doxycycline and still say not feeling any better.

## 2017-12-09 ENCOUNTER — Encounter: Payer: Self-pay | Admitting: Internal Medicine

## 2017-12-09 ENCOUNTER — Ambulatory Visit (INDEPENDENT_AMBULATORY_CARE_PROVIDER_SITE_OTHER): Payer: Medicare Other

## 2017-12-09 ENCOUNTER — Ambulatory Visit: Payer: Medicare Other | Admitting: Internal Medicine

## 2017-12-09 VITALS — BP 146/94 | HR 81 | Temp 97.6°F | Resp 15 | Ht 71.0 in | Wt 202.2 lb

## 2017-12-09 DIAGNOSIS — R059 Cough, unspecified: Secondary | ICD-10-CM

## 2017-12-09 DIAGNOSIS — R05 Cough: Secondary | ICD-10-CM | POA: Diagnosis not present

## 2017-12-09 DIAGNOSIS — J411 Mucopurulent chronic bronchitis: Secondary | ICD-10-CM

## 2017-12-09 DIAGNOSIS — E1151 Type 2 diabetes mellitus with diabetic peripheral angiopathy without gangrene: Secondary | ICD-10-CM

## 2017-12-09 MED ORDER — TIOTROPIUM BROMIDE MONOHYDRATE 18 MCG IN CAPS: 18.0000 ug | ORAL_CAPSULE | Freq: Every day | RESPIRATORY_TRACT | 12 refills | Status: DC

## 2017-12-09 MED ORDER — PREDNISONE 10 MG PO TABS
ORAL_TABLET | ORAL | 0 refills | Status: DC
Start: 2017-12-09 — End: 2017-12-29

## 2017-12-09 NOTE — Progress Notes (Signed)
Subjective:  Patient ID: Henry Lindsey, male    DOB: [birth year]  Age: 84 y.o. MRN: [id number]  CC: The primary encounter diagnosis was Cough. Diagnoses of Type 2 diabetes mellitus with diabetic peripheral angiopathy without gangrene, without long-term current use of insulin (HCC) and Chronic bronchitis with productive mucopurulent cough ([place]) were also pertinent to this visit.  HPI JASE REEP presents for recurrent productive cough , treated for COPD with doxycycline  .  Cough imporved but returned after antibiotics were stopped.   Lab Results  Component Value Date   HGBA1C 9.0 (H) 08/06/2017   Taking 28 units of insulin at bedtime and 25 untis in the morning   Outpatient Medications Prior to Visit  Medication Sig Dispense Refill  . albuterol (PROVENTIL HFA;VENTOLIN HFA) 108 (90 Base) MCG/ACT inhaler Inhale 1-2 puffs into the lungs every 6 (six) hours as needed for wheezing or shortness of breath (cough). 1 Inhaler 0  . Alum & Mag Hydroxide-Simeth (MAGIC MOUTHWASH) SOLN Take 5 mLs by mouth 3 (three) times daily as needed for mouth pain.    [name] aspirin 81 MG tablet Take 1 tablet (81 mg total) by mouth daily.    [name] atorvastatin (LIPITOR) 40 MG tablet TAKE 1 TABLET BY MOUTH EVERY DAY 90 tablet 0  . Blood Glucose Monitoring Suppl (ONE TOUCH ULTRA SYSTEM KIT) w/Device KIT 1 kit by Does not apply route once. Use DX code E11.59 1 each 0  . Cholecalciferol (VITAMIN D3) 2000 units TABS Take 1 tablet by mouth daily.    . cilostazol (PLETAL) 100 MG tablet TAKE 1 TABLET(100 MG) BY MOUTH TWICE DAILY 60 tablet 0  . famotidine (PEPCID) 20 MG tablet TAKE 1 TABLET(20 MG) BY MOUTH TWICE DAILY 180 tablet 1  . fenofibrate micronized (LOFIBRA) 134 MG capsule TAKE 1 CAPSULE BY MOUTH EVERY MORNING BEFORE BREAKFAST 30 capsule 0  . ferrous fumarate-iron polysaccharide complex (TANDEM) 162-115.2 MG CAPS capsule Take 1 capsule by mouth daily. With orange juice or vitamin C 30 capsule 3  . gabapentin (NEURONTIN) 400  MG capsule TAKE 1 CAPSULE(400 MG) BY MOUTH THREE TIMES DAILY 270 capsule 0  . glipiZIDE (GLUCOTROL) 10 MG tablet TAKE 1 TABLET(10 MG) BY MOUTH TWICE DAILY BEFORE A MEAL 60 tablet 0  . HYDROcodone-acetaminophen (NORCO/VICODIN) 5-325 MG tablet Take 1 tablet by mouth 2 (two) times daily as needed for moderate pain (KNEE PAIN). 60 tablet 0  . HYDROcodone-acetaminophen (NORCO/VICODIN) 5-325 MG tablet Take 1 tablet by mouth 2 (two) times daily as needed for moderate pain (KNEE PAIN). 60 tablet 0  . INS SYRINGE/NEEDLE 1CC/28G (B-D INSULIN SYRINGE 1CC/28G) 28G X 1/2" 1 ML MISC USE TO ADMINISTER INSULIN DAILY 100 each 0  . insulin lispro protamine-lispro (HUMALOG MIX 75/25) (75-25) 100 UNIT/ML SUSP injection Inject 22 units right before  breakfast and  27 units before dinner 10 mL 5  . latanoprost (XALATAN) 0.005 % ophthalmic solution INSTILL 1 DROP INTO BOTH EYES QD  5  . linagliptin (TRADJENTA) 5 MG TABS tablet Take 1 tablet (5 mg total) by mouth daily. 90 tablet 1  . metFORMIN (GLUCOPHAGE) 1000 MG tablet TAKE 1 TABLET BY MOUTH TWICE DAILY 180 tablet 1  . metFORMIN (GLUCOPHAGE) 1000 MG tablet TAKE 1 TABLET BY MOUTH TWICE DAILY 180 tablet 0  . metoprolol succinate (TOPROL-XL) 100 MG 24 hr tablet TAKE 1 TABLET BY MOUTH EVERY DAY WITH OR IMMEDIATELY FOLLOWING A MEAL 30 tablet 0  . Multiple Vitamin (MULTIVITAMIN WITH MINERALS) TABS tablet Take 1  tablet by mouth daily.    . nitroGLYCERIN (NITROSTAT) 0.4 MG SL tablet Place 1 tablet (0.4 mg total) under the tongue every 5 (five) minutes as needed for chest pain. MAXIMUM 3 TABLETS 50 tablet 3  . ONE TOUCH ULTRA TEST test strip USE THREE TIMES DAILY AS DIRECTED 100 each 0  . ONETOUCH DELICA LANCETS 05R MISC Use three times daily to check blood sugar. 100 each 11  . pantoprazole (PROTONIX) 40 MG tablet TAKE 1 TABLET(40 MG) BY MOUTH TWICE DAILY BEFORE A MEAL 60 tablet 0  . tamsulosin (FLOMAX) 0.4 MG CAPS capsule TAKE 1 CAPSULE BY MOUTH EVERY DAY 90 capsule 1  .  tamsulosin (FLOMAX) 0.4 MG CAPS capsule TAKE 1 CAPSULE(0.4 MG) BY MOUTH DAILY 90 capsule 0  . doxycycline (VIBRA-TABS) 100 MG tablet Take 1 tablet (100 mg total) by mouth 2 (two) times daily. (Patient not taking: Reported on 12/09/2017) 14 tablet 0  . HYDROcodone-homatropine (HYCODAN) 5-1.5 MG/5ML syrup Take 5 mLs by mouth at bedtime as needed for cough. (Patient not taking: Reported on 12/09/2017) 120 mL 0   No facility-administered medications prior to visit.     Review of Systems;  Patient denies headache, fevers, malaise, unintentional weight loss, skin rash, eye pain, sinus congestion and sinus pain, sore throat, dysphagia,  hemoptysis , cough, dyspnea, wheezing, chest pain, palpitations, orthopnea, edema, abdominal pain, nausea, melena, diarrhea, constipation, flank pain, dysuria, hematuria, urinary  Frequency, nocturia, numbness, tingling, seizures,  Focal weakness, Loss of consciousness,  Tremor, insomnia, depression, anxiety, and suicidal ideation.      Objective:  BP (!) 146/94 (BP Location: Left Arm, Patient Position: Sitting, Cuff Size: Normal)   Pulse 81   Temp 97.6 F (36.4 C) (Oral)   Resp 15   Ht '5\' 11"'  (1.803 m)   Wt 202 lb 3.2 oz (91.7 kg)   SpO2 93%   BMI 28.20 kg/m   BP Readings from Last 3 Encounters:  12/09/17 (!) 146/94  12/02/17 (!) 148/76  10/03/17 134/62    Wt Readings from Last 3 Encounters:  12/09/17 202 lb 3.2 oz (91.7 kg)  12/02/17 202 lb 6.4 oz (91.8 kg)  10/03/17 203 lb 12.8 oz (92.4 kg)    General appearance: alert, cooperative and appears stated age Ears: normal TM's and external ear canals both ears Throat: lips, mucosa, and tongue normal; teeth and gums normal Neck: no adenopathy, no carotid bruit, supple, symmetrical, trachea midline and thyroid not enlarged, symmetric, no tenderness/mass/nodules Back: symmetric, no curvature. ROM normal. No CVA tenderness. Lungs: clear to auscultation bilaterally Heart: regular rate and rhythm, S1, S2  normal, no murmur, click, rub or gallop Abdomen: soft, non-tender; bowel sounds normal; no masses,  no organomegaly Pulses: 2+ and symmetric Skin: Skin color, texture, turgor normal. No rashes or lesions Lymph nodes: Cervical, supraclavicular, and axillary nodes normal.  Lab Results  Component Value Date   HGBA1C 9.0 (H) 08/06/2017   HGBA1C 8.3 (H) 04/10/2017   HGBA1C 6.3 01/02/2017    Lab Results  Component Value Date   CREATININE 1.04 10/13/2017   CREATININE 1.56 (H) 10/07/2017   CREATININE 1.12 10/03/2017    Lab Results  Component Value Date   WBC 7.3 10/03/2017   HGB 10.7 (L) 10/03/2017   HCT 34.6 (L) 10/03/2017   PLT 343.0 10/03/2017   GLUCOSE 134 (H) 10/13/2017   CHOL 102 08/06/2017   TRIG 99.0 08/06/2017   HDL 46.50 08/06/2017   LDLDIRECT 62.0 10/04/2016   LDLCALC 36 08/06/2017   ALT  17 10/03/2017   AST 19 10/03/2017   NA 140 10/13/2017   K 4.4 10/13/2017   CL 106 10/13/2017   CREATININE 1.04 10/13/2017   BUN 17 10/13/2017   CO2 27 10/13/2017   TSH 2.943 10/20/2016   PSA 0.83 10/10/2014   INR 1.22 11/21/2016   HGBA1C 9.0 (H) 08/06/2017   MICROALBUR 5.6 (H) 08/06/2017    Ct Abdomen Pelvis W Contrast  Result Date: 09/08/2017 CLINICAL DATA:  Worsening left lower quadrant pain for 12 hours. Recent diverticulitis treated with antibiotics. EXAM: CT ABDOMEN AND PELVIS WITH CONTRAST TECHNIQUE: Multidetector CT imaging of the abdomen and pelvis was performed using the standard protocol following bolus administration of intravenous contrast. CONTRAST:  100 mL Isovue 300 COMPARISON:  08/21/2017 FINDINGS: Lower Chest: No acute findings. Stable bibasilar scarring and posterior lower lobe bronchiectasis, left side greater than right. Hepatobiliary: No hepatic masses identified. Prior cholecystectomy again noted, with stable tiny postop fluid collection adjacent to surgical clips in the gallbladder fossa. No evidence of biliary obstruction. Pancreas:  No mass or inflammatory  changes. Spleen: Within normal limits in size and appearance. Adrenals/Urinary Tract: No masses identified. Stable small bilateral renal cysts. No evidence of hydronephrosis. Stomach/Bowel: Stable tiny hiatal hernia. Mild diverticulitis is seen involving the distal descending colon which is mildly increased since previous study. No evidence of abscess or extraluminal air. Vascular/Lymphatic: No pathologically enlarged lymph nodes. No abdominal aortic aneurysm. Aortic atherosclerosis. Reproductive:  Stable mildly enlarged prostate. Other:  None. Musculoskeletal: No suspicious bone lesions identified. Advanced degenerative lumbar spondylosis again noted, with stable grade 1 anterolisthesis at L4-5 measuring 8 mm. IMPRESSION: Increased mild diverticulitis involving distal descending colon since prior study. No evidence of abscess or other complication. Other incidental findings described above. Electronically Signed   By: Earle Gell M.D.   On: 09/08/2017 13:00    Assessment & Plan:   Problem List Items Addressed This Visit    T2DM (type 2 diabetes mellitus) ([place])    incontrolled by review of recent CBGS .  All approaching 200.  Increase NPH by 3 units bid .        Relevant Orders   Hemoglobin A1c   Comprehensive metabolic panel   Chronic bronchitis with productive mucopurulent cough (HCC)    Recurrence of cough after finishing doxycycline.  Repeat chest x ray,  Labs ,  prednisone taper and adding spiriva for COPD        Other Visit Diagnoses    Cough    -  Primary   Relevant Orders   DG Chest 2 View   CBC with Differential/Platelet   Sedimentation rate   C-reactive protein     A total of 25 minutes of face to face time was spent with patient more than half of which was spent in counselling about the above mentioned conditions  and coordination of care  I have discontinued Javiel P. Salmela's doxycycline and HYDROcodone-homatropine. I am also having him start on predniSONE and tiotropium.  Additionally, I am having him maintain his magic mouthwash, ONE TOUCH ULTRA SYSTEM KIT, ONETOUCH DELICA LANCETS 75Z, latanoprost, INS SYRINGE/NEEDLE 1CC/28G, metFORMIN, fenofibrate micronized, nitroGLYCERIN, multivitamin with minerals, aspirin, Vitamin D3, tamsulosin, ONE TOUCH ULTRA TEST, gabapentin, metoprolol succinate, insulin lispro protamine-lispro, HYDROcodone-acetaminophen, HYDROcodone-acetaminophen, ferrous fumarate-iron polysaccharide complex, atorvastatin, metFORMIN, pantoprazole, glipiZIDE, cilostazol, tamsulosin, famotidine, linagliptin, and albuterol.  Meds ordered this encounter  Medications  . predniSONE (DELTASONE) 10 MG tablet    Sig: 6 tablets on Day 1 , then reduce by 1 tablet  daily until gone    Dispense:  21 tablet    Refill:  0  . tiotropium (SPIRIVA HANDIHALER) 18 MCG inhalation capsule    Sig: Place 1 capsule (18 mcg total) into inhaler and inhale daily.    Dispense:  30 capsule    Refill:  12    Medications Discontinued During This Encounter  Medication Reason  . doxycycline (VIBRA-TABS) 100 MG tablet Completed Course  . HYDROcodone-homatropine (HYCODAN) 5-1.5 MG/5ML syrup Completed Course    Follow-up: Return in about 3 months (around 03/08/2018) for follow up diabetes.   [name], MD

## 2017-12-09 NOTE — Assessment & Plan Note (Signed)
Recurrence of cough after finishing doxycycline.  Repeat chest x ray,  Labs ,  prednisone taper and adding spiriva for COPD

## 2017-12-09 NOTE — Assessment & Plan Note (Signed)
incontrolled by review of recent CBGS .  All approaching 200.  Increase NPH by 3 units bid .

## 2017-12-09 NOTE — Patient Instructions (Addendum)
You have COPD  Which is why you may still be coughing   I am prescribing a prednisone taper and a daily inhaler called Spiriva   If your chest  X ray  And labs suggest  Infection,   I will give you another round of antibiotic    Increase evening dose of  Insulin to 33 unit s  Increase  Your morning  dose of insulin to 31  units   Once your are off the prednisone you can reduce doses to 30 and 28

## 2017-12-10 LAB — CBC WITH DIFFERENTIAL/PLATELET
BASOS ABS: 0 10*3/uL (ref 0.0–0.1)
BASOS PCT: 0.4 % (ref 0.0–3.0)
EOS ABS: 0.4 10*3/uL (ref 0.0–0.7)
Eosinophils Relative: 4.5 % (ref 0.0–5.0)
HCT: 38.4 % — ABNORMAL LOW (ref 39.0–52.0)
Hemoglobin: 12.1 g/dL — ABNORMAL LOW (ref 13.0–17.0)
Lymphocytes Relative: 24.9 % (ref 12.0–46.0)
Lymphs Abs: 2.3 10*3/uL (ref 0.7–4.0)
MCHC: 31.4 g/dL (ref 30.0–36.0)
MCV: 77.5 fl — ABNORMAL LOW (ref 78.0–100.0)
MONO ABS: 0.7 10*3/uL (ref 0.1–1.0)
Monocytes Relative: 7.2 % (ref 3.0–12.0)
NEUTROS ABS: 5.8 10*3/uL (ref 1.4–7.7)
Neutrophils Relative %: 63 % (ref 43.0–77.0)
PLATELETS: 353 10*3/uL (ref 150.0–400.0)
RBC: 4.95 Mil/uL (ref 4.22–5.81)
RDW: 18.3 % — AB (ref 11.5–15.5)
WBC: 9.1 10*3/uL (ref 4.0–10.5)

## 2017-12-10 LAB — COMPREHENSIVE METABOLIC PANEL
ALBUMIN: 3.8 g/dL (ref 3.5–5.2)
ALT: 17 U/L (ref 0–53)
AST: 16 U/L (ref 0–37)
Alkaline Phosphatase: 74 U/L (ref 39–117)
BUN: 22 mg/dL (ref 6–23)
CHLORIDE: 105 meq/L (ref 96–112)
CO2: 27 mEq/L (ref 19–32)
CREATININE: 1.31 mg/dL (ref 0.40–1.50)
Calcium: 9.6 mg/dL (ref 8.4–10.5)
GFR: 55.59 mL/min — ABNORMAL LOW (ref >60.00)
Glucose, Bld: 172 mg/dL — ABNORMAL HIGH (ref 70–99)
Potassium: 4.7 mEq/L (ref 3.5–5.1)
SODIUM: 140 meq/L (ref 135–145)
Total Bilirubin: 0.3 mg/dL (ref 0.2–1.2)
Total Protein: 7.1 g/dL (ref 6.0–8.3)

## 2017-12-10 LAB — C-REACTIVE PROTEIN: CRP: 0.6 mg/dL (ref 0.5–20.0)

## 2017-12-10 LAB — HEMOGLOBIN A1C: Hgb A1c MFr Bld: 8.2 % — ABNORMAL HIGH (ref 4.6–6.5)

## 2017-12-10 LAB — SEDIMENTATION RATE: Sed Rate: 19 mm/hr (ref 0–20)

## 2017-12-11 ENCOUNTER — Encounter: Payer: Self-pay | Admitting: Internal Medicine

## 2017-12-19 ENCOUNTER — Other Ambulatory Visit: Payer: Self-pay | Admitting: Internal Medicine

## 2017-12-19 ENCOUNTER — Other Ambulatory Visit: Payer: Self-pay | Admitting: Cardiovascular Disease

## 2017-12-27 ENCOUNTER — Other Ambulatory Visit: Payer: Self-pay | Admitting: Internal Medicine

## 2017-12-29 ENCOUNTER — Ambulatory Visit (INDEPENDENT_AMBULATORY_CARE_PROVIDER_SITE_OTHER): Payer: Medicare Other | Admitting: Internal Medicine

## 2017-12-29 ENCOUNTER — Encounter: Payer: Self-pay | Admitting: Internal Medicine

## 2017-12-29 VITALS — BP 132/74 | HR 120 | Temp 97.7°F | Resp 15 | Ht 71.0 in | Wt 208.8 lb

## 2017-12-29 DIAGNOSIS — J441 Chronic obstructive pulmonary disease with (acute) exacerbation: Secondary | ICD-10-CM

## 2017-12-29 DIAGNOSIS — J209 Acute bronchitis, unspecified: Secondary | ICD-10-CM

## 2017-12-29 DIAGNOSIS — E1151 Type 2 diabetes mellitus with diabetic peripheral angiopathy without gangrene: Secondary | ICD-10-CM | POA: Diagnosis not present

## 2017-12-29 DIAGNOSIS — M1712 Unilateral primary osteoarthritis, left knee: Secondary | ICD-10-CM | POA: Diagnosis not present

## 2017-12-29 DIAGNOSIS — D509 Iron deficiency anemia, unspecified: Secondary | ICD-10-CM

## 2017-12-29 DIAGNOSIS — J42 Unspecified chronic bronchitis: Secondary | ICD-10-CM

## 2017-12-29 MED ORDER — PREDNISONE 10 MG PO TABS
ORAL_TABLET | ORAL | 0 refills | Status: DC
Start: 2017-12-29 — End: 2018-03-10

## 2017-12-29 MED ORDER — HYDROCODONE-ACETAMINOPHEN 5-325 MG PO TABS: 1.0000 | ORAL_TABLET | Freq: Two times a day (BID) | ORAL | 0 refills | Status: DC | PRN

## 2017-12-29 MED ORDER — CEFTRIAXONE SODIUM 1 G IJ SOLR
1.0000 g | Freq: Once | INTRAMUSCULAR | Status: AC
  Administered 2017-12-29: 1 g via INTRAMUSCULAR

## 2017-12-29 NOTE — Progress Notes (Signed)
Subjective:  Patient ID: Henry Lindsey, male    DOB: June 03, 1933  Age: 84 y.o. MRN: [id number]  CC: The primary encounter diagnosis was COPD exacerbation ([place]). Diagnoses of Chronic bronchitis with acute exacerbation ([place]), Primary osteoarthritis of left knee, Chronic iron deficiency anemia, and Type 2 diabetes mellitus with diabetic peripheral angiopathy without gangrene, without long-term current use of insulin ([place]) were also pertinent to this visit.  HPI Henry Lindsey presents for follow up on cough and chronic pain .  He was treated on Feb  19th  For bonchitis, viral,  When  Chest x ray was nornal and he denied fevers and purulent sputum.  Felt significant improvement on steroids and with use of albuterol MDI.  But relapsed two weeks ago and has been using OTC sough suppressants and albuterol MDI.  Cough is nonprodutive,  Denies pleurisy,  fever and malaise .  Has no confirmed diagnosis of COPD (No PFTs on file) but has a remote history of tobacco abuse.  Smoked from age  56 to 41   Outpatient Medications Prior to Visit  Medication Sig Dispense Refill  . albuterol (PROVENTIL HFA;VENTOLIN HFA) 108 (90 Base) MCG/ACT inhaler Inhale 1-2 puffs into the lungs every 6 (six) hours as needed for wheezing or shortness of breath (cough). 1 Inhaler 0  . Alum & Mag Hydroxide-Simeth (MAGIC MOUTHWASH) SOLN Take 5 mLs by mouth 3 (three) times daily as needed for mouth pain.    [name] aspirin 81 MG tablet Take 1 tablet (81 mg total) by mouth daily.    [name] atorvastatin (LIPITOR) 40 MG tablet TAKE 1 TABLET BY MOUTH EVERY DAY 90 tablet 0  . Blood Glucose Monitoring Suppl (ONE TOUCH ULTRA SYSTEM KIT) w/Device KIT 1 kit by Does not apply route once. Use DX code E11.59 1 each 0  . Cholecalciferol (VITAMIN D3) 2000 units TABS Take 1 tablet by mouth daily.    . cilostazol (PLETAL) 100 MG tablet TAKE 1 TABLET(100 MG) BY MOUTH TWICE DAILY 60 tablet 0  . famotidine (PEPCID) 20 MG tablet TAKE 1 TABLET(20 MG) BY MOUTH TWICE DAILY  180 tablet 1  . fenofibrate micronized (LOFIBRA) 134 MG capsule TAKE 1 CAPSULE BY MOUTH EVERY MORNING BEFORE BREAKFAST 30 capsule 0  . ferrous fumarate-iron polysaccharide complex (TANDEM) 162-115.2 MG CAPS capsule Take 1 capsule by mouth daily. With orange juice or vitamin C 30 capsule 3  . gabapentin (NEURONTIN) 400 MG capsule TAKE 1 CAPSULE(400 MG) BY MOUTH THREE TIMES DAILY 270 capsule 0  . glipiZIDE (GLUCOTROL) 10 MG tablet TAKE 1 TABLET(10 MG) BY MOUTH TWICE DAILY BEFORE A MEAL 60 tablet 0  . INS SYRINGE/NEEDLE 1CC/28G (B-D INSULIN SYRINGE 1CC/28G) 28G X 1/2" 1 ML MISC USE TO ADMINISTER INSULIN DAILY 100 each 0  . insulin lispro protamine-lispro (HUMALOG MIX 75/25) (75-25) 100 UNIT/ML SUSP injection Inject 22 units right before  breakfast and  27 units before dinner 10 mL 5  . latanoprost (XALATAN) 0.005 % ophthalmic solution INSTILL 1 DROP INTO BOTH EYES QD  5  . linagliptin (TRADJENTA) 5 MG TABS tablet Take 1 tablet (5 mg total) by mouth daily. 90 tablet 1  . meloxicam (MOBIC) 15 MG tablet TK 1 T PO QD WITH A MEAL  0  . metFORMIN (GLUCOPHAGE) 1000 MG tablet TAKE 1 TABLET BY MOUTH TWICE DAILY 180 tablet 1  . metoprolol succinate (TOPROL-XL) 100 MG 24 hr tablet TAKE 1 TABLET BY MOUTH EVERY DAY WITH OR IMMEDIATELY FOLLOWING A MEAL 90 tablet 1  .  Multiple Vitamin (MULTIVITAMIN WITH MINERALS) TABS tablet Take 1 tablet by mouth daily.    . nitroGLYCERIN (NITROSTAT) 0.4 MG SL tablet Place 1 tablet (0.4 mg total) under the tongue every 5 (five) minutes as needed for chest pain. MAXIMUM 3 TABLETS 50 tablet 3  . ONE TOUCH ULTRA TEST test strip USE THREE TIMES DAILY AS DIRECTED 100 each 0  . ONETOUCH DELICA LANCETS 46K MISC Use three times daily to check blood sugar. 100 each 11  . pantoprazole (PROTONIX) 40 MG tablet TAKE 1 TABLET(40 MG) BY MOUTH TWICE DAILY BEFORE A MEAL 60 tablet 0  . tamsulosin (FLOMAX) 0.4 MG CAPS capsule TAKE 1 CAPSULE BY MOUTH EVERY DAY 90 capsule 1  . timolol (TIMOPTIC) 0.5 %  ophthalmic solution INT 1 GTT IN OU QD  2  . tiotropium (SPIRIVA HANDIHALER) 18 MCG inhalation capsule Place 1 capsule (18 mcg total) into inhaler and inhale daily. 30 capsule 12  . HYDROcodone-acetaminophen (NORCO/VICODIN) 5-325 MG tablet Take 1 tablet by mouth 2 (two) times daily as needed for moderate pain (KNEE PAIN). 60 tablet 0  . HYDROcodone-acetaminophen (NORCO/VICODIN) 5-325 MG tablet Take 1 tablet by mouth 2 (two) times daily as needed for moderate pain (KNEE PAIN). 60 tablet 0  . metFORMIN (GLUCOPHAGE) 1000 MG tablet TAKE 1 TABLET BY MOUTH TWICE DAILY 180 tablet 0  . predniSONE (DELTASONE) 10 MG tablet 6 tablets on Day 1 , then reduce by 1 tablet daily until gone (Patient not taking: Reported on 12/29/2017) 21 tablet 0  . tamsulosin (FLOMAX) 0.4 MG CAPS capsule TAKE 1 CAPSULE(0.4 MG) BY MOUTH DAILY (Patient not taking: Reported on 12/29/2017) 90 capsule 0   No facility-administered medications prior to visit.     Review of Systems;  Patient denies headache, fevers, malaise, unintentional weight loss, skin rash, eye pain, sinus congestion and sinus pain, sore throat, dysphagia,  hemoptysis , cough, dyspnea, wheezing, chest pain, palpitations, orthopnea, edema, abdominal pain, nausea, melena, diarrhea, constipation, flank pain, dysuria, hematuria, urinary  Frequency, nocturia, numbness, tingling, seizures,  Focal weakness, Loss of consciousness,  Tremor, insomnia, depression, anxiety, and suicidal ideation.      Objective:  BP 132/74 (BP Location: Left Arm, Patient Position: Sitting, Cuff Size: Large)   Pulse (!) 120   Temp 97.7 F (36.5 C) (Oral)   Resp 15   Ht '5\' 11"'  (1.803 m)   Wt 208 lb 12.8 oz (94.7 kg)   SpO2 94%   BMI 29.12 kg/m   BP Readings from Last 3 Encounters:  12/29/17 132/74  12/09/17 (!) 146/94  12/02/17 (!) 148/76    Wt Readings from Last 3 Encounters:  12/29/17 208 lb 12.8 oz (94.7 kg)  12/09/17 202 lb 3.2 oz (91.7 kg)  12/02/17 202 lb 6.4 oz (91.8 kg)      General appearance: alert, cooperative and appears stated age Ears: normal TM's and external ear canals both ears Throat: lips, mucosa, and tongue normal; teeth and gums normal Neck: no adenopathy, no carotid bruit, supple, symmetrical, trachea midline and thyroid not enlarged, symmetric, no tenderness/mass/nodules Back: symmetric, no curvature. ROM normal. No CVA tenderness. Lungs: good air movement,  No ronchi but WHEEZING NOTED AT BASES bilaterally Heart: regular rate and rhythm, S1, S2 normal, no murmur, click, rub or gallop Abdomen: soft, non-tender; bowel sounds normal; no masses,  no organomegaly Pulses: 2+ and symmetric Skin: Skin color, texture, turgor normal. No rashes or lesions Lymph nodes: Cervical, supraclavicular, and axillary nodes normal.  Lab Results  Component Value Date  HGBA1C 8.2 (H) 12/09/2017   HGBA1C 9.0 (H) 08/06/2017   HGBA1C 8.3 (H) 04/10/2017    Lab Results  Component Value Date   CREATININE 1.31 12/09/2017   CREATININE 1.04 10/13/2017   CREATININE 1.56 (H) 10/07/2017    Lab Results  Component Value Date   WBC 9.1 12/09/2017   HGB 12.1 (L) 12/09/2017   HCT 38.4 (L) 12/09/2017   PLT 353.0 12/09/2017   GLUCOSE 172 (H) 12/09/2017   CHOL 102 08/06/2017   TRIG 99.0 08/06/2017   HDL 46.50 08/06/2017   LDLDIRECT 62.0 10/04/2016   LDLCALC 36 08/06/2017   ALT 17 12/09/2017   AST 16 12/09/2017   NA 140 12/09/2017   K 4.7 12/09/2017   CL 105 12/09/2017   CREATININE 1.31 12/09/2017   BUN 22 12/09/2017   CO2 27 12/09/2017   TSH 2.943 10/20/2016   PSA 0.83 10/10/2014   INR 1.22 11/21/2016   HGBA1C 8.2 (H) 12/09/2017   MICROALBUR 5.6 (H) 08/06/2017    Ct Abdomen Pelvis W Contrast  Result Date: 09/08/2017 CLINICAL DATA:  Worsening left lower quadrant pain for 12 hours. Recent diverticulitis treated with antibiotics. EXAM: CT ABDOMEN AND PELVIS WITH CONTRAST TECHNIQUE: Multidetector CT imaging of the abdomen and pelvis was performed using the  standard protocol following bolus administration of intravenous contrast. CONTRAST:  100 mL Isovue 300 COMPARISON:  08/21/2017 FINDINGS: Lower Chest: No acute findings. Stable bibasilar scarring and posterior lower lobe bronchiectasis, left side greater than right. Hepatobiliary: No hepatic masses identified. Prior cholecystectomy again noted, with stable tiny postop fluid collection adjacent to surgical clips in the gallbladder fossa. No evidence of biliary obstruction. Pancreas:  No mass or inflammatory changes. Spleen: Within normal limits in size and appearance. Adrenals/Urinary Tract: No masses identified. Stable small bilateral renal cysts. No evidence of hydronephrosis. Stomach/Bowel: Stable tiny hiatal hernia. Mild diverticulitis is seen involving the distal descending colon which is mildly increased since previous study. No evidence of abscess or extraluminal air. Vascular/Lymphatic: No pathologically enlarged lymph nodes. No abdominal aortic aneurysm. Aortic atherosclerosis. Reproductive:  Stable mildly enlarged prostate. Other:  None. Musculoskeletal: No suspicious bone lesions identified. Advanced degenerative lumbar spondylosis again noted, with stable grade 1 anterolisthesis at L4-5 measuring 8 mm. IMPRESSION: Increased mild diverticulitis involving distal descending colon since prior study. No evidence of abscess or other complication. Other incidental findings described above. Electronically Signed   By: Earle Gell M.D.   On: 09/08/2017 13:00    Assessment & Plan:   Problem List Items Addressed This Visit    Chronic bronchitis with acute exacerbation (HCC)    Given his recurrence of symptoms,  I would like to add empiric abx as I suspect he has undiagosed copd.  All  Appropriate antibiotic regimens (augmentin, doxycycline,  azithromycin ) are at least  $60 due to "doughnot hole" so will give IM Roephin today,  Have him return tomorrow for another dose,  And repeat prednisone  taper.   Needs  PFTS this spring when symptoms have resolved.       Relevant Medications   predniSONE (DELTASONE) 10 MG tablet   Chronic iron deficiency anemia    First noted in June 2017 and has Improved but not resolved with iron supplementation.  Last colonoscopy is unknown. Last FOBT was negative in 2014  .Given his age,  Will screen with cologuard and IFOBT .  If both negative, resume iron       Relevant Orders   CBC with Differential/Platelet   Iron,  TIBC and Ferritin Panel   Vitamin B12   POC Hemoccult Bld/Stl (3-Cd Home Screen)   Left knee DJD    Still severe despite prior knee replacement, right knee hurting as well.  Continue vicodin  Daily . Refills given today for 4 months after Refill history confirmed via [place] Controlled Substance database, accessed by me today..      Relevant Medications   meloxicam (MOBIC) 15 MG tablet   predniSONE (DELTASONE) 10 MG tablet   HYDROcodone-acetaminophen (NORCO/VICODIN) 5-325 MG tablet   HYDROcodone-acetaminophen (NORCO/VICODIN) 5-325 MG tablet   HYDROcodone-acetaminophen (NORCO/VICODIN) 5-325 MG tablet   T2DM (type 2 diabetes mellitus) ([place])   Relevant Orders   Hemoglobin A1c   Lipid panel   Comprehensive metabolic panel    Other Visit Diagnoses    COPD exacerbation ([place])    -  Primary   Relevant Medications   predniSONE (DELTASONE) 10 MG tablet   cefTRIAXone (ROCEPHIN) injection 1 g (Completed)      I have discontinued Henry Lindsey's predniSONE. I am also having him start on predniSONE. Additionally, I am having him maintain his magic mouthwash, ONE TOUCH ULTRA SYSTEM KIT, ONETOUCH DELICA LANCETS 85Y, latanoprost, INS SYRINGE/NEEDLE 1CC/28G, metFORMIN, fenofibrate micronized, nitroGLYCERIN, multivitamin with minerals, aspirin, Vitamin D3, tamsulosin, ONE TOUCH ULTRA TEST, gabapentin, insulin lispro protamine-lispro, ferrous fumarate-iron polysaccharide complex, atorvastatin, famotidine, linagliptin, albuterol, tiotropium, cilostazol, pantoprazole,  glipiZIDE, metoprolol succinate, meloxicam, timolol, HYDROcodone-acetaminophen, HYDROcodone-acetaminophen, and HYDROcodone-acetaminophen. We administered cefTRIAXone.  Meds ordered this encounter  Medications  . predniSONE (DELTASONE) 10 MG tablet    Sig: 6 tablets on Day 1 , then reduce by 1 tablet daily until gone    Dispense:  21 tablet    Refill:  0  . cefTRIAXone (ROCEPHIN) injection 1 g  . HYDROcodone-acetaminophen (NORCO/VICODIN) 5-325 MG tablet    Sig: Take 1 tablet by mouth 2 (two) times daily as needed for moderate pain (KNEE PAIN).    Dispense:  60 tablet    Refill:  0    May refill on or after  December 29 2017  . HYDROcodone-acetaminophen (NORCO/VICODIN) 5-325 MG tablet    Sig: Take 1 tablet by mouth 2 (two) times daily as needed for moderate pain (KNEE PAIN).    Dispense:  60 tablet    Refill:  0    May refill on or after  January 29 2018  . HYDROcodone-acetaminophen (NORCO/VICODIN) 5-325 MG tablet    Sig: Take 1 tablet by mouth 2 (two) times daily as needed for moderate pain (KNEE PAIN).    Dispense:  60 tablet    Refill:  0    May refill on or after  Feb 28 2018    Medications Discontinued During This Encounter  Medication Reason  . metFORMIN (GLUCOPHAGE) 8502 MG tablet Duplicate  . predniSONE (DELTASONE) 10 MG tablet Completed Course  . tamsulosin (FLOMAX) 0.4 MG CAPS capsule Duplicate  . HYDROcodone-acetaminophen (NORCO/VICODIN) 5-325 MG tablet Reorder  . HYDROcodone-acetaminophen (NORCO/VICODIN) 5-325 MG tablet Reorder    Follow-up: No Follow-up on file.   [name], MD

## 2017-12-29 NOTE — Patient Instructions (Addendum)
You received a dose of Ceftriaxone today in your arm .  This is an antibiotic  Return tomorrow to receive another dose in your other arm.   Take the prednisone taper starting tomorrow.   I will see you in late May for follow up on diabetes

## 2017-12-30 ENCOUNTER — Ambulatory Visit (INDEPENDENT_AMBULATORY_CARE_PROVIDER_SITE_OTHER): Payer: Medicare Other

## 2017-12-30 ENCOUNTER — Ambulatory Visit: Payer: Medicare Other

## 2017-12-30 DIAGNOSIS — J42 Unspecified chronic bronchitis: Secondary | ICD-10-CM

## 2017-12-30 DIAGNOSIS — J209 Acute bronchitis, unspecified: Secondary | ICD-10-CM | POA: Diagnosis not present

## 2017-12-30 MED ORDER — CEFTRIAXONE SODIUM 1 G IJ SOLR
1.0000 g | Freq: Once | INTRAMUSCULAR | Status: AC
  Administered 2017-12-30: 1 g via INTRAMUSCULAR

## 2017-12-30 NOTE — Assessment & Plan Note (Signed)
Still severe despite prior knee replacement, right knee hurting as well.  Continue vicodin  Daily . Refills given today for 4 months after Refill history confirmed via [place] Controlled Substance database, accessed by me today.[name]

## 2017-12-30 NOTE — Assessment & Plan Note (Addendum)
First noted in June 2017 and has Improved but not resolved with iron supplementation.  Last colonoscopy is unknown. Last FOBT was negative in 2014  .Given his age,  Will screen with cologuard and IFOBT .  If both negative, resume iron

## 2017-12-30 NOTE — Progress Notes (Signed)
Pt presents today for 2nd dose of Ceftriaxone. Left upper outer quadrant. Pt voiced no concerns nor showed any signs of distress during injection.

## 2017-12-30 NOTE — Assessment & Plan Note (Signed)
Given his recurrence of symptoms,  I would like to add empiric abx as I suspect he has undiagosed copd.  All  Appropriate antibiotic regimens (augmentin, doxycycline,  azithromycin ) are at least  $60 due to "doughnot hole" so will give IM Roephin today,  Have him return tomorrow for another dose,  And repeat prednisone  taper.   Needs PFTS this spring when symptoms have resolved.

## 2018-01-09 ENCOUNTER — Other Ambulatory Visit: Payer: Self-pay | Admitting: Internal Medicine

## 2018-01-22 ENCOUNTER — Other Ambulatory Visit: Payer: Self-pay | Admitting: Internal Medicine

## 2018-01-28 ENCOUNTER — Telehealth: Payer: Self-pay | Admitting: Gastroenterology

## 2018-01-28 NOTE — Telephone Encounter (Signed)
Pt is calling to schedule a procedure pt states he spoke with Dr. Allen Norris about it but he was not sure what the name is of the procedure please call pt

## 2018-01-28 NOTE — Telephone Encounter (Signed)
Pt left vm to schedule apt with Dr. Allen Norris, attempted to return call left vm for him to call us back

## 2018-01-29 ENCOUNTER — Other Ambulatory Visit: Payer: Self-pay

## 2018-01-29 DIAGNOSIS — R197 Diarrhea, unspecified: Secondary | ICD-10-CM

## 2018-01-29 MED ORDER — NA SULFATE-K SULFATE-MG SULF 17.5-3.13-1.6 GM/177ML PO SOLN: 1.0000 | ORAL | 0 refills | Status: DC

## 2018-01-29 NOTE — Telephone Encounter (Signed)
Pt scheduled for a colonoscopy at [facility] on 02/10/18.

## 2018-02-03 ENCOUNTER — Telehealth: Payer: Self-pay | Admitting: Internal Medicine

## 2018-02-03 NOTE — Telephone Encounter (Signed)
Pt is returning call, please call back - [phone]

## 2018-02-03 NOTE — Telephone Encounter (Signed)
Pt came into office and wanted to know if he could get another refill on his prednison, congestion is back, not green.

## 2018-02-03 NOTE — Telephone Encounter (Signed)
LMTCB. Need to get a little more information on pt's symptoms. P[facility] may speak with pt.

## 2018-02-04 ENCOUNTER — Other Ambulatory Visit: Payer: Self-pay | Admitting: Internal Medicine

## 2018-02-04 NOTE — Telephone Encounter (Signed)
Patient called and states that he will just wait to make appt if worse next week

## 2018-02-04 NOTE — Telephone Encounter (Signed)
Please advise 

## 2018-02-04 NOTE — Telephone Encounter (Signed)
Patient calling back again, states that now his mucus is turning brown, cough is not any better. Would like to move forward with prednisone request. Has been taking mucinex, not working. Please advise Call back [phone]

## 2018-02-05 NOTE — Telephone Encounter (Signed)
Pt returned call

## 2018-02-05 NOTE — Telephone Encounter (Signed)
LMTCB. Please transfer pt to our office.  

## 2018-02-05 NOTE — Telephone Encounter (Signed)
See below

## 2018-02-05 NOTE — Telephone Encounter (Signed)
pAtient needs to be seen ,  If he can be here by 2;00 pm I will see him otherwise he will have to see another provider

## 2018-02-05 NOTE — Telephone Encounter (Signed)
Please advise 

## 2018-02-05 NOTE — Telephone Encounter (Signed)
Spoke with pt and informed him that we are not open tomorrow and that Dr. Derrel Nip advises that he go to urgent care for the cough and brown sputum. The pt stated that he has an event in [place] this weekend and will not be going to urgent care. He stated that he has been taking "some medicine that has helped with the congestion". He also stated that if he is not any better by Monday he will give our office call to see about getting an appt.

## 2018-02-10 ENCOUNTER — Ambulatory Visit: Payer: Medicare Other | Admitting: Anesthesiology

## 2018-02-10 ENCOUNTER — Encounter
Admission: RE | Disposition: A | Discharge status: Home / Self Care | Payer: Self-pay | Source: Ambulatory Visit | Attending: Gastroenterology

## 2018-02-10 ENCOUNTER — Ambulatory Visit
Admission: RE | Admit: 2018-02-10 | Discharge: 2018-02-10 | Disposition: A | Discharge status: Home / Self Care | Payer: Medicare Other | Source: Ambulatory Visit | Attending: Gastroenterology | Admitting: Gastroenterology

## 2018-02-10 DIAGNOSIS — R197 Diarrhea, unspecified: Secondary | ICD-10-CM

## 2018-02-10 DIAGNOSIS — D123 Benign neoplasm of transverse colon: Secondary | ICD-10-CM | POA: Diagnosis not present

## 2018-02-10 DIAGNOSIS — N400 Enlarged prostate without lower urinary tract symptoms: Secondary | ICD-10-CM | POA: Insufficient documentation

## 2018-02-10 DIAGNOSIS — Z8673 Personal history of transient ischemic attack (TIA), and cerebral infarction without residual deficits: Secondary | ICD-10-CM | POA: Insufficient documentation

## 2018-02-10 DIAGNOSIS — K5730 Diverticulosis of large intestine without perforation or abscess without bleeding: Secondary | ICD-10-CM | POA: Insufficient documentation

## 2018-02-10 DIAGNOSIS — Z7982 Long term (current) use of aspirin: Secondary | ICD-10-CM | POA: Insufficient documentation

## 2018-02-10 DIAGNOSIS — E785 Hyperlipidemia, unspecified: Secondary | ICD-10-CM | POA: Insufficient documentation

## 2018-02-10 DIAGNOSIS — Z87891 Personal history of nicotine dependence: Secondary | ICD-10-CM | POA: Insufficient documentation

## 2018-02-10 DIAGNOSIS — Z794 Long term (current) use of insulin: Secondary | ICD-10-CM | POA: Diagnosis not present

## 2018-02-10 DIAGNOSIS — Z79899 Other long term (current) drug therapy: Secondary | ICD-10-CM | POA: Insufficient documentation

## 2018-02-10 DIAGNOSIS — K529 Noninfective gastroenteritis and colitis, unspecified: Secondary | ICD-10-CM | POA: Diagnosis present

## 2018-02-10 DIAGNOSIS — K219 Gastro-esophageal reflux disease without esophagitis: Secondary | ICD-10-CM | POA: Diagnosis not present

## 2018-02-10 DIAGNOSIS — J449 Chronic obstructive pulmonary disease, unspecified: Secondary | ICD-10-CM | POA: Insufficient documentation

## 2018-02-10 DIAGNOSIS — E119 Type 2 diabetes mellitus without complications: Secondary | ICD-10-CM | POA: Insufficient documentation

## 2018-02-10 DIAGNOSIS — I10 Essential (primary) hypertension: Secondary | ICD-10-CM | POA: Insufficient documentation

## 2018-02-10 HISTORY — PX: COLONOSCOPY WITH PROPOFOL: SHX5780

## 2018-02-10 LAB — GLUCOSE, CAPILLARY: Glucose-Capillary: 229 mg/dL — ABNORMAL HIGH (ref 65–99)

## 2018-02-10 SURGERY — COLONOSCOPY WITH PROPOFOL
Anesthesia: General

## 2018-02-10 MED ORDER — PROPOFOL 10 MG/ML IV BOLUS
INTRAVENOUS | Status: DC | PRN
  Administered 2018-02-10: 10 mg via INTRAVENOUS
  Administered 2018-02-10: 20 mg via INTRAVENOUS
  Administered 2018-02-10 (×3): 10 mg via INTRAVENOUS

## 2018-02-10 MED ORDER — SODIUM CHLORIDE 0.9 % IV SOLN
INTRAVENOUS | Status: DC
  Administered 2018-02-10: 10:00:00 via INTRAVENOUS
  Administered 2018-02-10: 1000 mL via INTRAVENOUS

## 2018-02-10 NOTE — H&P (Signed)
Lucilla Lame, MD [address] [place], [place] [address] Phone:[phone] Fax : [phone]  Primary Care Physician:  [name], MD Primary Gastroenterologist:  Dr. Allen Norris  Pre-Procedure History & Physical: HPI:  Henry Lindsey is a 84 y.o. male is here for an colonoscopy.   Past Medical History:  Diagnosis Date  . BPH (benign prostatic hyperplasia)   . Carotid artery stenosis 11/2010   <50% bilaterally  . COPD (chronic obstructive pulmonary disease) ([place])    noted CT 10/05/16 former smoker quit age 34   . CVA (cerebral infarction) 11/2010   r thalamic lacunar  . Diabetes mellitus   . GERD (gastroesophageal reflux disease)   . Hyperlipidemia   . Hypertension   . Neuromuscular disorder ([place])   . Peripheral vascular disease in diabetes mellitus ([place]) 06/2012    95% occlusion s/p PTCA R SFA Dew Sept 2013  . Stroke [facility])     Past Surgical History:  Procedure Laterality Date  . CHOLECYSTECTOMY N/A 11/13/2016   Procedure: LAPAROSCOPIC CHOLECYSTECTOMY WITH INTRAOPERATIVE CHOLANGIOGRAM;  Surgeon: Olean Ree, MD;  Location: [facility] ORS;  Service: General;  Laterality: N/A;  . ERCP N/A 11/17/2016   Procedure: ENDOSCOPIC RETROGRADE CHOLANGIOPANCREATOGRAPHY (ERCP);  Surgeon: Irene Shipper, MD;  Location: [facility] ENDOSCOPY;  Service: Endoscopy;  Laterality: N/A;  . ERCP N/A 02/04/2017   Procedure: ENDOSCOPIC RETROGRADE CHOLANGIOPANCREATOGRAPHY (ERCP) Stent removal;  Surgeon: Lucilla Lame, MD;  Location: [facility] ENDOSCOPY;  Service: Endoscopy;  Laterality: N/A;  . IR GENERIC HISTORICAL  10/06/2016   IR PERC CHOLECYSTOSTOMY 10/06/2016 Aletta Edouard, MD [facility]-INTER[facility] RAD  . JOINT REPLACEMENT Left 2014   left knee  . UPPER GASTROINTESTINAL ENDOSCOPY    . WRIST SURGERY      Prior to Admission medications   Medication Sig Start Date End Date Taking? Authorizing Provider  albuterol (PROVENTIL HFA;VENTOLIN HFA) 108 (90 Base) MCG/ACT inhaler Inhale 1-2 puffs into the lungs every 6  (six) hours as needed for wheezing or shortness of breath (cough). 12/02/17  Yes McLean-Scocuzza, Nino Glow, MD  Alum & Mag Hydroxide-Simeth (MAGIC MOUTHWASH) SOLN Take 5 mLs by mouth 3 (three) times daily as needed for mouth pain.   Yes [provider]  aspirin 81 MG tablet Take 1 tablet (81 mg total) by mouth daily. 11/05/16  Yes Gollan, Kathlene November, MD  atorvastatin (LIPITOR) 40 MG tablet TAKE 1 TABLET BY MOUTH EVERY DAY 01/23/18  Yes [name], MD  Blood Glucose Monitoring Suppl (ONE TOUCH ULTRA SYSTEM KIT) w/Device KIT 1 kit by Does not apply route once. Use DX code E11.59 10/18/15  Yes [name], MD  Cholecalciferol (VITAMIN D3) 2000 units TABS Take 1 tablet by mouth daily.   Yes [provider]  cilostazol (PLETAL) 100 MG tablet TAKE 1 TABLET(100 MG) BY MOUTH TWICE DAILY 01/23/18  Yes [name], MD  famotidine (PEPCID) 20 MG tablet TAKE 1 TABLET(20 MG) BY MOUTH TWICE DAILY 11/27/17  Yes [name], MD  fenofibrate micronized (LOFIBRA) 134 MG capsule TAKE 1 CAPSULE BY MOUTH EVERY MORNING BEFORE BREAKFAST 08/28/16  Yes [name], MD  ferrous fumarate-iron polysaccharide complex (TANDEM) 162-115.2 MG CAPS capsule Take 1 capsule by mouth daily. With orange juice or vitamin C 10/09/17  Yes [name], MD  gabapentin (NEURONTIN) 400 MG capsule TAKE 1 CAPSULE(400 MG) BY MOUTH THREE TIMES DAILY 01/12/18  Yes [name], MD  glipiZIDE (GLUCOTROL) 10 MG tablet TAKE 1 TABLET(10 MG) BY MOUTH TWICE DAILY  BEFORE A MEAL 01/23/18  Yes [name], MD  HYDROcodone-acetaminophen (NORCO/VICODIN) 5-325 MG tablet Take 1 tablet by mouth 2 (two) times daily as needed for moderate pain (KNEE PAIN). 12/29/17  Yes [name], MD  HYDROcodone-acetaminophen (NORCO/VICODIN) 5-325 MG tablet Take 1 tablet by mouth 2 (two) times daily as needed for moderate pain (KNEE PAIN). 12/29/17  Yes [name], MD  HYDROcodone-acetaminophen (NORCO/VICODIN) 5-325 MG tablet Take 1 tablet  by mouth 2 (two) times daily as needed for moderate pain (KNEE PAIN). 12/29/17  Yes [name], MD  INS SYRINGE/NEEDLE 1CC/28G (B-D INSULIN SYRINGE 1CC/28G) 28G X 1/2" 1 ML MISC USE TO ADMINISTER INSULIN DAILY 02/13/16  Yes [name], MD  insulin lispro protamine-lispro (HUMALOG MIX 75/25) (75-25) 100 UNIT/ML SUSP injection Inject 22 units right before  breakfast and  27 units before dinner 10/03/17  Yes [name], MD  latanoprost (XALATAN) 0.005 % ophthalmic solution INSTILL 1 DROP INTO BOTH EYES QD 11/01/15  Yes [provider]  linagliptin (TRADJENTA) 5 MG TABS tablet Take 1 tablet (5 mg total) by mouth daily. 12/02/17  Yes McLean-Scocuzza, Nino Glow, MD  meloxicam (MOBIC) 15 MG tablet TK 1 T PO QD WITH A MEAL 12/19/17  Yes [provider]  metFORMIN (GLUCOPHAGE) 1000 MG tablet TAKE 1 TABLET BY MOUTH TWICE DAILY 07/26/16  Yes [name], MD  metFORMIN (GLUCOPHAGE) 1000 MG tablet TAKE 1 TABLET BY MOUTH TWICE DAILY 02/04/18  Yes [name], MD  metoprolol succinate (TOPROL-XL) 100 MG 24 hr tablet TAKE 1 TABLET BY MOUTH EVERY DAY WITH OR IMMEDIATELY FOLLOWING A MEAL 12/29/17  Yes [name], MD  Multiple Vitamin (MULTIVITAMIN WITH MINERALS) TABS tablet Take 1 tablet by mouth daily.   Yes [provider]  Na Sulfate-K Sulfate-Mg Sulf (SUPREP BOWEL PREP KIT) 17.5-3.13-1.6 GM/177ML SOLN Take 1 kit by mouth as directed. 01/29/18  Yes Lucilla Lame, MD  nitroGLYCERIN (NITROSTAT) 0.4 MG SL tablet Place 1 tablet (0.4 mg total) under the tongue every 5 (five) minutes as needed for chest pain. MAXIMUM 3 TABLETS 10/04/16  Yes [name], MD  ONE TOUCH ULTRA TEST test strip USE THREE TIMES DAILY AS DIRECTED 07/02/17  Yes [name], MD  [facility] DELICA LANCETS 54U MISC Use three times daily to check blood sugar. 10/18/15  Yes [name], MD  pantoprazole (PROTONIX) 40 MG tablet TAKE 1 TABLET(40 MG) BY MOUTH TWICE DAILY BEFORE A MEAL 01/23/18  Yes [name], MD  predniSONE (DELTASONE) 10 MG tablet 6 tablets on Day 1 , then reduce by 1 tablet daily until gone 12/29/17  Yes [name], MD  tamsulosin (FLOMAX) 0.4 MG CAPS capsule TAKE 1 CAPSULE BY MOUTH EVERY DAY 02/17/17  Yes [name], MD  timolol (TIMOPTIC) 0.5 % ophthalmic solution INT 1 GTT IN OU QD 12/19/17  Yes [provider]  tiotropium (SPIRIVA HANDIHALER) 18 MCG inhalation capsule Place 1 capsule (18 mcg total) into inhaler and inhale daily. 12/09/17  Yes [name], MD    Allergies as of 01/29/2018  . (No Known Allergies)    Family History  Problem Relation Age of Onset  . Diabetes Mother     Social History   Socioeconomic History  . Marital status: Widowed    Spouse name: Enid Derry, deceased  . Number of children: 2  . Years of education: 54  . Highest education level: Not on file  Occupational History  Employer: retired  Scientific laboratory technician  . Financial resource strain: Not on file  . Food insecurity:    Worry: Not on file    Inability: Not on file  . Transportation needs:    Medical: Not on file    Non-medical: Not on file  Tobacco Use  . Smoking status: Former Smoker    Packs/day: 0.50    Years: 33.00    Pack years: 16.50    Types: Cigarettes    Start date: 1935-06-29    Last attempt to quit: 04/21/1968    Years since quitting: 49.8  . Smokeless tobacco: Never Used  Substance and Sexual Activity  . Alcohol use: No  . Drug use: No  . Sexual activity: Never    Partners: Female    Birth control/protection: None  Lifestyle  . Physical activity:    Days per week: Not on file    Minutes per session: Not on file  . Stress: Not on file  Relationships  . Social connections:    Talks on phone: Not on file    Gets together: Not on file    Attends religious service: Not on file    Active member of club or organization: Not on file    Attends meetings of clubs or organizations: Not on file    Relationship status: Not on file  . Intimate  partner violence:    Fear of current or ex partner: Not on file    Emotionally abused: Not on file    Physically abused: Not on file    Forced sexual activity: Not on file  Other Topics Concern  . Not on file  Social History Narrative   Widowed in 2014; dating again     Review of Systems: See HPI, otherwise negative ROS  Physical Exam: BP (!) 159/105   Pulse 85   Temp (!) 96.9 F (36.1 C) (Tympanic)   Resp 20   Ht _0  (1.803 m)   Wt 180 lb (81.6 kg)   SpO2 97%   BMI 25.10 kg/m  General:   Alert,  pleasant and cooperative in NAD Head:  Normocephalic and atraumatic. Neck:  Supple; no masses or thyromegaly. Lungs:  Clear throughout to auscultation.    Heart:  Regular rate and rhythm. Abdomen:  Soft, nontender and nondistended. Normal bowel sounds, without guarding, and without rebound.   Neurologic:  Alert and  oriented x4;  grossly normal neurologically.  Impression/Plan: [name] is here for an colonoscopy to be performed for diarrhea  Risks, benefits, limitations, and alternatives regarding  colonoscopy have been reviewed with the patient.  Questions have been answered.  All parties agreeable.   Lucilla Lame, MD  02/10/2018, 9:39 AM

## 2018-02-10 NOTE — Transfer of Care (Signed)
Immediate Anesthesia Transfer of Care Note  Patient: Henry Lindsey  Procedure(s) Performed: COLONOSCOPY WITH PROPOFOL (N/A )  Patient Location: PACU  Anesthesia Type:General  Level of Consciousness: sedated  Airway & Oxygen Therapy: Patient Spontanous Breathing and Patient connected to nasal cannula oxygen  Post-op Assessment: Report given to RN and Post -op Vital signs reviewed and stable  Post vital signs: Reviewed and stable  Last Vitals:  Vitals Value Taken Time  BP 116/54 02/10/2018 10:37 AM  Temp    Pulse 78 02/10/2018 10:38 AM  Resp 16 02/10/2018 10:38 AM  SpO2 98 % 02/10/2018 10:38 AM  Vitals shown include unvalidated device data.  Last Pain:  Vitals:   02/10/18 1036  TempSrc: Tympanic  PainSc:          Complications: No apparent anesthesia complications

## 2018-02-10 NOTE — Op Note (Signed)
[facility] Gastroenterology Patient Name: Henry Lindsey Procedure Date: 02/10/2018 10:15 AM MRN: [id number] Account #: [id number] Date of Birth: [birth year] Admit Type: Outpatient Age: 84 Room: [facility] ENDO ROOM 4 Gender: Male Note Status: Finalized Procedure:            Colonoscopy Indications:          Chronic diarrhea Providers:            Lucilla Lame MD, MD Referring MD:         Deborra [name], MD (Referring MD) Medicines:            Propofol per Anesthesia Complications:        No immediate complications. Procedure:            Pre-Anesthesia Assessment:                       - Prior to the procedure, a History and Physical was                        performed, and patient medications and allergies were                        reviewed. The patient's tolerance of previous                        anesthesia was also reviewed. The risks and benefits of                        the procedure and the sedation options and risks were                        discussed with the patient. All questions were                        answered, and informed consent was obtained. Prior                        Anticoagulants: The patient has taken no previous                        anticoagulant or antiplatelet agents. ASA Grade                        Assessment: II - A patient with mild systemic disease.                        After reviewing the risks and benefits, the patient was                        deemed in satisfactory condition to undergo the                        procedure.                       After obtaining informed consent, the colonoscope was                        passed under direct vision. Throughout the procedure,  the patient's blood pressure, pulse, and oxygen                        saturations were monitored continuously. The                        Colonoscope was introduced through the anus and                        advanced to the the  terminal ileum. The colonoscopy was                        performed without difficulty. The patient tolerated the                        procedure well. The quality of the bowel preparation                        was excellent. Findings:      The perianal and digital rectal examinations were normal.      The terminal ileum appeared normal. Biopsies were taken with a cold       forceps for histology.      A 4 mm polyp was found in the transverse colon. The polyp was sessile.       The polyp was removed with a cold snare. Resection and retrieval were       complete.      Multiple small-mouthed diverticula were found in the entire colon.      Random biopsies were obtained with cold forceps for histology randomly       in the entire colon. Impression:           - The examined portion of the ileum was normal.                        Biopsied.                       - One 4 mm polyp in the transverse colon, removed with                        a cold snare. Resected and retrieved.                       - Diverticulosis in the entire examined colon.                       - Random biopsies were obtained in the entire colon. Recommendation:       - Discharge patient to home.                       - Resume previous diet.                       - Continue present medications.                       - Await pathology results. Procedure Code(s):    --- Professional ---                       [phone], Colonoscopy, flexible; with removal of tumor(s),  polyp(s), or other lesion(s) by snare technique                       [id number], 59, Colonoscopy, flexible; with biopsy, single                        or multiple Diagnosis Code(s):    --- Professional ---                       K52.9, Noninfective gastroenteritis and colitis,                        unspecified                       D12.3, Benign neoplasm of transverse colon (hepatic                        flexure or splenic flexure) CPT  copyright 2017 American Medical Association. All rights reserved. The codes documented in this report are preliminary and upon coder review may  be revised to meet current compliance requirements. Lucilla Lame MD, MD 02/10/2018 10:36:10 AM This report has been signed electronically. Number of Addenda: 0 Note Initiated On: 02/10/2018 10:15 AM Scope Withdrawal Time: 0 hours 7 minutes 45 seconds  Total Procedure Duration: 0 hours 11 minutes 28 seconds       [facility]

## 2018-02-10 NOTE — Anesthesia Preprocedure Evaluation (Signed)
Anesthesia Evaluation  Patient identified by MRN, date of birth, ID band Patient awake    Reviewed: Allergy & Precautions, NPO status , Patient's Chart, lab work & pertinent test results  History of Anesthesia Complications Negative for: history of anesthetic complications  Airway Mallampati: II  TM Distance: >3 FB Neck ROM: Full    Dental  (+) Poor Dentition   Pulmonary neg sleep apnea, COPD, former smoker,    breath sounds clear to auscultation- rhonchi (-) wheezing      Cardiovascular hypertension, Pt. on medications + Peripheral Vascular Disease  (-) CAD, (-) Past MI, (-) Cardiac Stents and (-) CABG  Rhythm:Regular Rate:Normal - Systolic murmurs and - Diastolic murmurs    Neuro/Psych CVA (L hand numbness), Residual Symptoms negative psych ROS   GI/Hepatic Neg liver ROS, GERD  ,  Endo/Other  diabetes, Oral Hypoglycemic Agents  Renal/GU negative Renal ROS     Musculoskeletal  (+) Arthritis ,   Abdominal (+) - obese,   Peds  Hematology  (+) anemia ,   Anesthesia Other Findings Past Medical History: No date: BPH (benign prostatic hyperplasia) 11/2010: Carotid artery stenosis     Comment:  <50% bilaterally No date: COPD (chronic obstructive pulmonary disease) ([facility])     Comment:  noted CT 10/05/16 former smoker quit age 84  11/2010: CVA (cerebral infarction)     Comment:  r thalamic lacunar No date: Diabetes mellitus No date: GERD (gastroesophageal reflux disease) No date: Hyperlipidemia No date: Hypertension No date: Neuromuscular disorder ([place]) 06/2012: Peripheral vascular disease in diabetes mellitus ([place])     Comment:   95% occlusion s/p PTCA R SFA Dew Sept 2013 No date: Stroke [facility])   Reproductive/Obstetrics                             Anesthesia Physical Anesthesia Plan  ASA: III  Anesthesia Plan: General   Post-op Pain Management:    Induction: Intravenous  PONV  Risk Score and Plan: 1 and Propofol infusion  Airway Management Planned: Natural Airway  Additional Equipment:   Intra-op Plan:   Post-operative Plan:   Informed Consent: I have reviewed the patients History and Physical, chart, labs and discussed the procedure including the risks, benefits and alternatives for the proposed anesthesia with the patient or authorized representative who has indicated his/her understanding and acceptance.   Dental advisory given  Plan Discussed with: CRNA and Anesthesiologist  Anesthesia Plan Comments:         Anesthesia Quick Evaluation

## 2018-02-10 NOTE — Anesthesia Post-op Follow-up Note (Signed)
Anesthesia QCDR form completed.        

## 2018-02-10 NOTE — Anesthesia Postprocedure Evaluation (Signed)
Anesthesia Post Note  Patient: Henry Lindsey  Procedure(s) Performed: COLONOSCOPY WITH PROPOFOL (N/A )  Patient location during evaluation: Endoscopy Anesthesia Type: General Level of consciousness: awake and alert and oriented Pain management: pain level controlled Vital Signs Assessment: post-procedure vital signs reviewed and stable Respiratory status: spontaneous breathing, nonlabored ventilation and respiratory function stable Cardiovascular status: blood pressure returned to baseline and stable Postop Assessment: no signs of nausea or vomiting Anesthetic complications: no     Last Vitals:  Vitals:   02/10/18 1049 02/10/18 1054  BP: 140/73 (!) 146/64  Pulse:    Resp:    Temp:    SpO2: 97%     Last Pain:  Vitals:   02/10/18 1054  TempSrc:   PainSc: 0-No pain                 [name]

## 2018-02-11 ENCOUNTER — Encounter: Payer: Self-pay | Admitting: Gastroenterology

## 2018-02-11 LAB — SURGICAL PATHOLOGY

## 2018-02-17 ENCOUNTER — Telehealth: Payer: Self-pay | Admitting: Gastroenterology

## 2018-02-17 NOTE — Telephone Encounter (Signed)
Pt scheduled for a follow up appt to discuss results.

## 2018-02-17 NOTE — Telephone Encounter (Signed)
-----   Message from Lucilla Lame, MD sent at 02/12/2018 10:29 AM EDT ----- Please have the patient come in for a follow up.

## 2018-02-17 NOTE — Telephone Encounter (Signed)
PT IS CALLING TO CHECK IF HIS RESULTS WERE BACK PLEASE CALL PT

## 2018-02-18 ENCOUNTER — Telehealth: Payer: Self-pay | Admitting: Gastroenterology

## 2018-02-18 ENCOUNTER — Other Ambulatory Visit: Payer: Self-pay | Admitting: Internal Medicine

## 2018-02-18 ENCOUNTER — Other Ambulatory Visit: Payer: Self-pay

## 2018-02-18 DIAGNOSIS — R197 Diarrhea, unspecified: Secondary | ICD-10-CM

## 2018-02-18 MED ORDER — CIPROFLOXACIN HCL 500 MG PO TABS: 500.0000 mg | ORAL_TABLET | Freq: Two times a day (BID) | ORAL | 0 refills | Status: DC

## 2018-02-18 NOTE — Telephone Encounter (Signed)
Pt has been notified he will need to have a GI panel done to check for GI bacteria. Also, per Dr. Allen Norris pt is to have Cipro 500mg  BID for 7 days.

## 2018-02-18 NOTE — Telephone Encounter (Signed)
Pt needs rx for dirreah to [facility] [address]  [address] [employer].  He states he has it 7 times a day and can not wait til apt 05/14. He doesn't want over counter stuff please call pt

## 2018-02-24 ENCOUNTER — Encounter: Payer: Self-pay | Admitting: Internal Medicine

## 2018-02-24 ENCOUNTER — Ambulatory Visit: Payer: Medicare Other | Admitting: Internal Medicine

## 2018-02-24 ENCOUNTER — Ambulatory Visit: Payer: Medicare Other

## 2018-02-24 VITALS — BP 118/60 | HR 73 | Temp 98.1°F | Resp 15 | Ht 71.0 in | Wt 206.2 lb

## 2018-02-24 DIAGNOSIS — M1712 Unilateral primary osteoarthritis, left knee: Secondary | ICD-10-CM | POA: Diagnosis not present

## 2018-02-24 DIAGNOSIS — R197 Diarrhea, unspecified: Secondary | ICD-10-CM

## 2018-02-24 DIAGNOSIS — E118 Type 2 diabetes mellitus with unspecified complications: Secondary | ICD-10-CM | POA: Diagnosis not present

## 2018-02-24 DIAGNOSIS — K529 Noninfective gastroenteritis and colitis, unspecified: Secondary | ICD-10-CM

## 2018-02-24 MED ORDER — HYDROCODONE-ACETAMINOPHEN 5-325 MG PO TABS: 1.0000 | ORAL_TABLET | Freq: Two times a day (BID) | ORAL | 0 refills | Status: DC | PRN

## 2018-02-24 NOTE — Progress Notes (Signed)
Subjective:  Patient ID: Henry Lindsey, male    DOB: 1933/01/07  Age: 84 y.o. MRN: [id number]  CC: The primary encounter diagnosis was Type 2 diabetes mellitus with complication, without long-term current use of insulin ([place]). Diagnoses of Diarrhea, unspecified type, Chronic diarrhea, and Primary osteoarthritis of left knee were also pertinent to this visit.  HPI Henry Lindsey presents for medication refill .  Last seen in Feb by [place]   Was admitted to [facility] on April 23 for diagnostic colonoscopy by Lucilla Lame for persistent diarrhea lasting nearly 5 months. .  Path report:  Colitis.  Prescribed cipro 500 mg bid x 7 days  On May 1 when he called to report that he was having 7 loose stools daily .  Currently having 4 to 5 daily.  . First solid stool occurred today.    Had a a lower molar pulled and an upper cap replaced  last week  Patient has been eating a lot of yogurt from [employer].   using 75/25  Insulin Bid 30 units twice daily   but not always before he eats.  He Is following a low glycemic index diet and taking all prescribed medications regularly without side effects.  Fasting sugars have been under less than 150 most of the time and post prandials have been under 180 except on rare occasions. Patient is exercising about 3 times per week and intentionally trying to lose weight .  Patient has had an eye exam in the last 12 months and checks feet regularly for signs of infection.  Patient does not walk barefoot outside,  And denies an numbness tingling or burning in feet. Patient is up to date on all recommended vaccinations  FOOT EXAM DONE  NEEDS TO SEE PODIATRY FOR DYSTROPHIC NAILS  SEES VASCULAR IN July    Right Knee pain/right hip pain :  He has severe DJD of knee Both were injected in January by [place] .  he continues to  Have severe pain requiring twice daily use of Vicodin     Lab Results  Component Value Date   MICROALBUR 5.6 (H) 08/06/2017    Lab Results  Component  Value Date   HGBA1C 8.2 (H) 12/09/2017    Outpatient Medications Prior to Visit  Medication Sig Dispense Refill  . albuterol (PROVENTIL HFA;VENTOLIN HFA) 108 (90 Base) MCG/ACT inhaler Inhale 1-2 puffs into the lungs every 6 (six) hours as needed for wheezing or shortness of breath (cough). 1 Inhaler 0  . Alum & Mag Hydroxide-Simeth (MAGIC MOUTHWASH) SOLN Take 5 mLs by mouth 3 (three) times daily as needed for mouth pain.    [name] aspirin 81 MG tablet Take 1 tablet (81 mg total) by mouth daily.    [name] atorvastatin (LIPITOR) 40 MG tablet TAKE 1 TABLET BY MOUTH EVERY DAY 90 tablet 1  . Blood Glucose Monitoring Suppl (ONE TOUCH ULTRA SYSTEM KIT) w/Device KIT 1 kit by Does not apply route once. Use DX code E11.59 1 each 0  . Cholecalciferol (VITAMIN D3) 2000 units TABS Take 1 tablet by mouth daily.    . cilostazol (PLETAL) 100 MG tablet TAKE 1 TABLET(100 MG) BY MOUTH TWICE DAILY 180 tablet 1  . ciprofloxacin (CIPRO) 500 MG tablet Take 1 tablet (500 mg total) by mouth 2 (two) times daily. 14 tablet 0  . famotidine (PEPCID) 20 MG tablet TAKE 1 TABLET(20 MG) BY MOUTH TWICE DAILY 180 tablet 1  . fenofibrate micronized (LOFIBRA) 134 MG capsule TAKE 1  CAPSULE BY MOUTH EVERY MORNING BEFORE BREAKFAST 30 capsule 0  . ferrous fumarate-iron polysaccharide complex (TANDEM) 162-115.2 MG CAPS capsule Take 1 capsule by mouth daily. With orange juice or vitamin C 30 capsule 3  . gabapentin (NEURONTIN) 400 MG capsule TAKE 1 CAPSULE(400 MG) BY MOUTH THREE TIMES DAILY 270 capsule 1  . glipiZIDE (GLUCOTROL) 10 MG tablet TAKE 1 TABLET(10 MG) BY MOUTH TWICE DAILY BEFORE A MEAL 180 tablet 1  . INS SYRINGE/NEEDLE 1CC/28G (B-D INSULIN SYRINGE 1CC/28G) 28G X 1/2" 1 ML MISC USE TO ADMINISTER INSULIN DAILY 100 each 0  . insulin lispro protamine-lispro (HUMALOG MIX 75/25) (75-25) 100 UNIT/ML SUSP injection Inject 22 units right before  breakfast and  27 units before dinner 10 mL 5  . latanoprost (XALATAN) 0.005 % ophthalmic solution  INSTILL 1 DROP INTO BOTH EYES QD  5  . linagliptin (TRADJENTA) 5 MG TABS tablet Take 1 tablet (5 mg total) by mouth daily. 90 tablet 1  . meloxicam (MOBIC) 15 MG tablet TK 1 T PO QD WITH A MEAL  0  . metFORMIN (GLUCOPHAGE) 1000 MG tablet TAKE 1 TABLET BY MOUTH TWICE DAILY 180 tablet 1  . metFORMIN (GLUCOPHAGE) 1000 MG tablet TAKE 1 TABLET BY MOUTH TWICE DAILY 180 tablet 0  . metoprolol succinate (TOPROL-XL) 100 MG 24 hr tablet TAKE 1 TABLET BY MOUTH EVERY DAY WITH OR IMMEDIATELY FOLLOWING A MEAL 90 tablet 1  . Multiple Vitamin (MULTIVITAMIN WITH MINERALS) TABS tablet Take 1 tablet by mouth daily.    . Na Sulfate-K Sulfate-Mg Sulf (SUPREP BOWEL PREP KIT) 17.5-3.13-1.6 GM/177ML SOLN Take 1 kit by mouth as directed. 1 Bottle 0  . nitroGLYCERIN (NITROSTAT) 0.4 MG SL tablet Place 1 tablet (0.4 mg total) under the tongue every 5 (five) minutes as needed for chest pain. MAXIMUM 3 TABLETS 50 tablet 3  . ONE TOUCH ULTRA TEST test strip USE THREE TIMES DAILY AS DIRECTED 100 each 0  . ONETOUCH DELICA LANCETS 46E MISC Use three times daily to check blood sugar. 100 each 11  . pantoprazole (PROTONIX) 40 MG tablet TAKE 1 TABLET(40 MG) BY MOUTH TWICE DAILY BEFORE A MEAL 180 tablet 1  . predniSONE (DELTASONE) 10 MG tablet 6 tablets on Day 1 , then reduce by 1 tablet daily until gone 21 tablet 0  . tamsulosin (FLOMAX) 0.4 MG CAPS capsule TAKE 1 CAPSULE BY MOUTH EVERY DAY 90 capsule 1  . tamsulosin (FLOMAX) 0.4 MG CAPS capsule TAKE 1 CAPSULE(0.4 MG) BY MOUTH DAILY 90 capsule 1  . timolol (TIMOPTIC) 0.5 % ophthalmic solution INT 1 GTT IN OU QD  2  . tiotropium (SPIRIVA HANDIHALER) 18 MCG inhalation capsule Place 1 capsule (18 mcg total) into inhaler and inhale daily. 30 capsule 12  . HYDROcodone-acetaminophen (NORCO/VICODIN) 5-325 MG tablet Take 1 tablet by mouth 2 (two) times daily as needed for moderate pain (KNEE PAIN). 60 tablet 0  . HYDROcodone-acetaminophen (NORCO/VICODIN) 5-325 MG tablet Take 1 tablet by mouth  2 (two) times daily as needed for moderate pain (KNEE PAIN). 60 tablet 0  . HYDROcodone-acetaminophen (NORCO/VICODIN) 5-325 MG tablet Take 1 tablet by mouth 2 (two) times daily as needed for moderate pain (KNEE PAIN). 60 tablet 0   No facility-administered medications prior to visit.     Review of Systems;  Patient denies headache, fevers, malaise, unintentional weight loss, skin rash, eye pain, sinus congestion and sinus pain, sore throat, dysphagia,  hemoptysis , cough, dyspnea, wheezing, chest pain, palpitations, orthopnea, edema, abdominal pain, nausea, melena,  constipation, flank pain, dysuria, hematuria, urinary  Frequency, nocturia, numbness, tingling, seizures,  Focal weakness, Loss of consciousness,  Tremor, insomnia, depression, anxiety, and suicidal ideation.      Objective:  BP 118/60 (BP Location: Left Arm, Patient Position: Sitting, Cuff Size: Normal)   Pulse 73   Temp 98.1 F (36.7 C) (Oral)   Resp 15   Ht '5\' 11"'  (1.803 m)   Wt 206 lb 3.2 oz (93.5 kg)   SpO2 95%   BMI 28.76 kg/m   BP Readings from Last 3 Encounters:  02/24/18 118/60  02/10/18 (!) 146/64  12/29/17 132/74    Wt Readings from Last 3 Encounters:  02/24/18 206 lb 3.2 oz (93.5 kg)  02/10/18 180 lb (81.6 kg)  12/29/17 208 lb 12.8 oz (94.7 kg)    General appearance: alert, cooperative and appears stated age Ears: normal TM's and external ear canals both ears Throat: lips, mucosa, and tongue normal; teeth and gums normal Neck: no adenopathy, no carotid bruit, supple, symmetrical, trachea midline and thyroid not enlarged, symmetric, no tenderness/mass/nodules Back: symmetric, no curvature. ROM normal. No CVA tenderness. Lungs: clear to auscultation bilaterally Heart: regular rate and rhythm, S1, S2 normal, no murmur, click, rub or gallop Abdomen: soft, non-tender; bowel sounds normal; no masses,  no organomegaly Pulses: 2+ and symmetric Skin: Skin color, texture, turgor normal. No rashes or  lesions Lymph nodes: Cervical, supraclavicular, and axillary nodes normal.  Lab Results  Component Value Date   HGBA1C 8.2 (H) 12/09/2017   HGBA1C 9.0 (H) 08/06/2017   HGBA1C 8.3 (H) 04/10/2017    Lab Results  Component Value Date   CREATININE 1.31 12/09/2017   CREATININE 1.04 10/13/2017   CREATININE 1.56 (H) 10/07/2017    Lab Results  Component Value Date   WBC 9.1 12/09/2017   HGB 12.1 (L) 12/09/2017   HCT 38.4 (L) 12/09/2017   PLT 353.0 12/09/2017   GLUCOSE 172 (H) 12/09/2017   CHOL 102 08/06/2017   TRIG 99.0 08/06/2017   HDL 46.50 08/06/2017   LDLDIRECT 62.0 10/04/2016   LDLCALC 36 08/06/2017   ALT 17 12/09/2017   AST 16 12/09/2017   NA 140 12/09/2017   K 4.7 12/09/2017   CL 105 12/09/2017   CREATININE 1.31 12/09/2017   BUN 22 12/09/2017   CO2 27 12/09/2017   TSH 2.943 10/20/2016   PSA 0.83 10/10/2014   INR 1.22 11/21/2016   HGBA1C 8.2 (H) 12/09/2017   MICROALBUR 5.6 (H) 08/06/2017    No results found.  Assessment & Plan:   Problem List Items Addressed This Visit    Diarrhea   Relevant Orders   Gastrointestinal Pathogen Panel PCR (Completed)   T2DM (type 2 diabetes mellitus) ([place]) - Primary    incontrolled by review of recent CBGS .  All approaching 200.  Iadvised to  traget one meal per day with pre and post sugars so his mealtime insulin can be modified  .        Relevant Orders   Hemoglobin A1c   Comprehensive metabolic panel   Lipid panel   Left knee DJD    Still severe despite recent I/Ainjection of steroids  In right knee and prior left  knee replacement,   Continue vicodin  Daily . Refills given today for 3 months after Refill history confirmed via [place] Controlled Substance database, accessed by me today..      Relevant Medications   HYDROcodone-acetaminophen (NORCO/VICODIN) 5-325 MG tablet   HYDROcodone-acetaminophen (NORCO/VICODIN) 5-325 MG tablet   HYDROcodone-acetaminophen (  NORCO/VICODIN) 5-325 MG tablet   Chronic diarrhea    With  "colitis" on path report from recent colonoscopy, with no evidence of Crohn's or UC. No improvement with empiric cipro .  Has not submitted stool for analysis . Need to rule out use of artificial sweeteners  As the cause           I am having Fontaine P. Coultas maintain his magic mouthwash, ONE TOUCH ULTRA SYSTEM KIT, ONETOUCH DELICA LANCETS 94V, latanoprost, INS SYRINGE/NEEDLE 1CC/28G, metFORMIN, fenofibrate micronized, nitroGLYCERIN, multivitamin with minerals, aspirin, Vitamin D3, tamsulosin, ONE TOUCH ULTRA TEST, insulin lispro protamine-lispro, ferrous fumarate-iron polysaccharide complex, famotidine, linagliptin, albuterol, tiotropium, metoprolol succinate, meloxicam, timolol, predniSONE, gabapentin, pantoprazole, cilostazol, glipiZIDE, atorvastatin, Na Sulfate-K Sulfate-Mg Sulf, metFORMIN, ciprofloxacin, tamsulosin, HYDROcodone-acetaminophen, HYDROcodone-acetaminophen, and HYDROcodone-acetaminophen.  Meds ordered this encounter  Medications  . HYDROcodone-acetaminophen (NORCO/VICODIN) 5-325 MG tablet    Sig: Take 1 tablet by mouth 2 (two) times daily as needed for moderate pain (KNEE PAIN).    Dispense:  60 tablet    Refill:  0    May refill on or after  March 30 2018  . HYDROcodone-acetaminophen (NORCO/VICODIN) 5-325 MG tablet    Sig: Take 1 tablet by mouth 2 (two) times daily as needed for moderate pain (KNEE PAIN).    Dispense:  60 tablet    Refill:  0    May refill on or after  April 29, 2018  . HYDROcodone-acetaminophen (NORCO/VICODIN) 5-325 MG tablet    Sig: Take 1 tablet by mouth 2 (two) times daily as needed for moderate pain (KNEE PAIN).    Dispense:  60 tablet    Refill:  0    May refill on or after  May 30, 2018    Medications Discontinued During This Encounter  Medication Reason  . HYDROcodone-acetaminophen (NORCO/VICODIN) 5-325 MG tablet Reorder  . HYDROcodone-acetaminophen (NORCO/VICODIN) 5-325 MG tablet Reorder  . HYDROcodone-acetaminophen (NORCO/VICODIN) 5-325 MG  tablet Reorder    Follow-up: Return in about 3 months (around 05/27/2018) for fasting labs May 20 or later , follow up diabetes.   [name], MD

## 2018-02-24 NOTE — Patient Instructions (Addendum)
Please return on May 20th for your blood work,  3 months with me    You can drop off your stool samples here for Dr Dorothey Baseman tests  Your sugars are still high.  Please target one meal each day and use the spreadsheet to record the before and after blood sugars    Try to find the yogurts that have 10 g sugar or less  [employer] [place]  [employer] Triple Zero   2 Good  (2 g sugar)    Hydrocodone refilled for June July and August

## 2018-02-25 ENCOUNTER — Other Ambulatory Visit: Payer: Self-pay | Admitting: Radiology

## 2018-02-25 ENCOUNTER — Other Ambulatory Visit: Payer: Medicare Other

## 2018-02-26 DIAGNOSIS — K529 Noninfective gastroenteritis and colitis, unspecified: Secondary | ICD-10-CM | POA: Insufficient documentation

## 2018-02-26 LAB — GASTROINTESTINAL PATHOGEN PANEL PCR
C. difficile Tox A/B, PCR: NOT DETECTED
Campylobacter, PCR: NOT DETECTED
Cryptosporidium, PCR: NOT DETECTED
E COLI (ETEC) LT/ST, PCR: NOT DETECTED
E COLI (STEC) STX1/STX2, PCR: NOT DETECTED
E coli 0157, PCR: NOT DETECTED
GIARDIA LAMBLIA, PCR: NOT DETECTED
NOROVIRUS, PCR: NOT DETECTED
ROTAVIRUS, PCR: NOT DETECTED
SHIGELLA, PCR: NOT DETECTED
Salmonella, PCR: NOT DETECTED

## 2018-02-26 NOTE — Assessment & Plan Note (Signed)
With "colitis" on path report from recent colonoscopy, with no evidence of Crohn's or UC. No improvement with empiric cipro .  Has not submitted stool for analysis . Need to rule out use of artificial sweeteners  As the cause

## 2018-02-26 NOTE — Assessment & Plan Note (Signed)
incontrolled by review of recent CBGS .  All approaching 200.  Iadvised to  traget one meal per day with pre and post sugars so his mealtime insulin can be modified  .

## 2018-02-26 NOTE — Assessment & Plan Note (Addendum)
Still severe despite recent I/Ainjection of steroids  In right knee and prior left  knee replacement,   Continue vicodin  Daily . Refills given today for 3 months after Refill history confirmed via [place] Controlled Substance database, accessed by me today.. 

## 2018-02-27 ENCOUNTER — Telehealth: Payer: Self-pay

## 2018-02-27 NOTE — Telephone Encounter (Signed)
-----   Message from Lucilla Lame, MD sent at 02/26/2018  1:20 PM EDT ----- Let the patient know his stool studies were normal

## 2018-02-27 NOTE — Telephone Encounter (Signed)
Pt notified of lab result. 

## 2018-03-03 ENCOUNTER — Ambulatory Visit (INDEPENDENT_AMBULATORY_CARE_PROVIDER_SITE_OTHER): Payer: Medicare Other | Admitting: Gastroenterology

## 2018-03-03 ENCOUNTER — Encounter: Payer: Self-pay | Admitting: Gastroenterology

## 2018-03-03 VITALS — BP 156/62 | HR 79 | Ht 71.0 in | Wt 208.4 lb

## 2018-03-03 DIAGNOSIS — K591 Functional diarrhea: Secondary | ICD-10-CM

## 2018-03-03 NOTE — Progress Notes (Signed)
Primary Care Physician: [name], MD  Primary Gastroenterologist:  Dr. Lucilla Lame  No chief complaint on file.   HPI: Henry Lindsey is a 84 y.o. male here for follow-up after having a colonoscopy.  The patient was having the procedure for diarrhea.  The patient was found to have colitis on biopsies.  The patient was reported to have mild active colitis without any signs of chronicity.  The biopsies of the terminal ileum were normal. The patient continues to have diarrhea.  He does report that he has been taking metformin for some time and has stopped his Mobic months ago.  The patient also reports that he has approximately 2 glasses of milk every day.  He also reports that he has been having some bloating.   Current Outpatient Medications  Medication Sig Dispense Refill  . albuterol (PROVENTIL HFA;VENTOLIN HFA) 108 (90 Base) MCG/ACT inhaler Inhale 1-2 puffs into the lungs every 6 (six) hours as needed for wheezing or shortness of breath (cough). 1 Inhaler 0  . Alum & Mag Hydroxide-Simeth (MAGIC MOUTHWASH) SOLN Take 5 mLs by mouth 3 (three) times daily as needed for mouth pain.    [name] aspirin 81 MG tablet Take 1 tablet (81 mg total) by mouth daily.    [name] atorvastatin (LIPITOR) 40 MG tablet TAKE 1 TABLET BY MOUTH EVERY DAY 90 tablet 1  . Blood Glucose Monitoring Suppl (ONE TOUCH ULTRA SYSTEM KIT) w/Device KIT 1 kit by Does not apply route once. Use DX code E11.59 1 each 0  . Cholecalciferol (VITAMIN D3) 2000 units TABS Take 1 tablet by mouth daily.    . cilostazol (PLETAL) 100 MG tablet TAKE 1 TABLET(100 MG) BY MOUTH TWICE DAILY 180 tablet 1  . ciprofloxacin (CIPRO) 500 MG tablet Take 1 tablet (500 mg total) by mouth 2 (two) times daily. 14 tablet 0  . famotidine (PEPCID) 20 MG tablet TAKE 1 TABLET(20 MG) BY MOUTH TWICE DAILY 180 tablet 1  . fenofibrate micronized (LOFIBRA) 134 MG capsule TAKE 1 CAPSULE BY MOUTH EVERY MORNING BEFORE BREAKFAST 30 capsule 0  . ferrous fumarate-iron  polysaccharide complex (TANDEM) 162-115.2 MG CAPS capsule Take 1 capsule by mouth daily. With orange juice or vitamin C 30 capsule 3  . gabapentin (NEURONTIN) 400 MG capsule TAKE 1 CAPSULE(400 MG) BY MOUTH THREE TIMES DAILY 270 capsule 1  . glipiZIDE (GLUCOTROL) 10 MG tablet TAKE 1 TABLET(10 MG) BY MOUTH TWICE DAILY BEFORE A MEAL 180 tablet 1  . HYDROcodone-acetaminophen (NORCO/VICODIN) 5-325 MG tablet Take 1 tablet by mouth 2 (two) times daily as needed for moderate pain (KNEE PAIN). 60 tablet 0  . HYDROcodone-acetaminophen (NORCO/VICODIN) 5-325 MG tablet Take 1 tablet by mouth 2 (two) times daily as needed for moderate pain (KNEE PAIN). 60 tablet 0  . HYDROcodone-acetaminophen (NORCO/VICODIN) 5-325 MG tablet Take 1 tablet by mouth 2 (two) times daily as needed for moderate pain (KNEE PAIN). 60 tablet 0  . INS SYRINGE/NEEDLE 1CC/28G (B-D INSULIN SYRINGE 1CC/28G) 28G X 1/2" 1 ML MISC USE TO ADMINISTER INSULIN DAILY 100 each 0  . insulin lispro protamine-lispro (HUMALOG MIX 75/25) (75-25) 100 UNIT/ML SUSP injection Inject 22 units right before  breakfast and  27 units before dinner 10 mL 5  . latanoprost (XALATAN) 0.005 % ophthalmic solution INSTILL 1 DROP INTO BOTH EYES QD  5  . linagliptin (TRADJENTA) 5 MG TABS tablet Take 1 tablet (5 mg total) by mouth daily. 90 tablet 1  . meloxicam (MOBIC) 15 MG tablet  TK 1 T PO QD WITH A MEAL  0  . metFORMIN (GLUCOPHAGE) 1000 MG tablet TAKE 1 TABLET BY MOUTH TWICE DAILY 180 tablet 1  . metFORMIN (GLUCOPHAGE) 1000 MG tablet TAKE 1 TABLET BY MOUTH TWICE DAILY 180 tablet 0  . metoprolol succinate (TOPROL-XL) 100 MG 24 hr tablet TAKE 1 TABLET BY MOUTH EVERY DAY WITH OR IMMEDIATELY FOLLOWING A MEAL 90 tablet 1  . Multiple Vitamin (MULTIVITAMIN WITH MINERALS) TABS tablet Take 1 tablet by mouth daily.    . Na Sulfate-K Sulfate-Mg Sulf (SUPREP BOWEL PREP KIT) 17.5-3.13-1.6 GM/177ML SOLN Take 1 kit by mouth as directed. 1 Bottle 0  . nitroGLYCERIN (NITROSTAT) 0.4 MG SL  tablet Place 1 tablet (0.4 mg total) under the tongue every 5 (five) minutes as needed for chest pain. MAXIMUM 3 TABLETS 50 tablet 3  . ONE TOUCH ULTRA TEST test strip USE THREE TIMES DAILY AS DIRECTED 100 each 0  . ONETOUCH DELICA LANCETS 96V MISC Use three times daily to check blood sugar. 100 each 11  . pantoprazole (PROTONIX) 40 MG tablet TAKE 1 TABLET(40 MG) BY MOUTH TWICE DAILY BEFORE A MEAL 180 tablet 1  . predniSONE (DELTASONE) 10 MG tablet 6 tablets on Day 1 , then reduce by 1 tablet daily until gone 21 tablet 0  . tamsulosin (FLOMAX) 0.4 MG CAPS capsule TAKE 1 CAPSULE BY MOUTH EVERY DAY 90 capsule 1  . tamsulosin (FLOMAX) 0.4 MG CAPS capsule TAKE 1 CAPSULE(0.4 MG) BY MOUTH DAILY 90 capsule 1  . timolol (TIMOPTIC) 0.5 % ophthalmic solution INT 1 GTT IN OU QD  2  . tiotropium (SPIRIVA HANDIHALER) 18 MCG inhalation capsule Place 1 capsule (18 mcg total) into inhaler and inhale daily. 30 capsule 12   No current facility-administered medications for this visit.     Allergies as of 03/03/2018  . (No Known Allergies)    ROS:  General: Negative for anorexia, weight loss, fever, chills, fatigue, weakness. ENT: Negative for hoarseness, difficulty swallowing , nasal congestion. CV: Negative for chest pain, angina, palpitations, dyspnea on exertion, peripheral edema.  Respiratory: Negative for dyspnea at rest, dyspnea on exertion, cough, sputum, wheezing.  GI: See history of present illness. GU:  Negative for dysuria, hematuria, urinary incontinence, urinary frequency, nocturnal urination.  Endo: Negative for unusual weight change.    Physical Examination:   There were no vitals taken for this visit.  General: Well-nourished, well-developed in no acute distress.  Eyes: No icterus. Conjunctivae pink. Extremities: No lower extremity edema. No clubbing or deformities. Neuro: Alert and oriented x 3.  Grossly intact. Skin: Warm and dry, no jaundice.   Psych: Alert and cooperative, normal  mood and affect.  Labs:    Imaging Studies: No results found.  Assessment and Plan:   Henry Lindsey is a 84 y.o. y/o male who comes in after having a colonoscopy with mild acute colitis with a history of having a negative CRP and continues to have diarrhea. The patient is unlikely to have chronic inflammatory bowel disease and may be having diarrhea due to the metformin or the Mobic.  Additionally he a be having diarrhea due to his dairy intake.  The patient has been trying to eat yogurt for its probiotic effects without any benefit.  The patient has been told to stop the probiotics and all the dairy products for 1 week to see if his symptoms get better.  If they do not the patient may need to try and be switched from his metformin  to see if his diarrhea goes away.  The patient has also been told that if stopping milk does not help his symptoms he may want to try Imodium and titrated to effect.  The patient has been explained the plan and agrees with it.    Lucilla Lame, MD. Marval Regal   Note: This dictation was prepared with Dragon dictation along with smaller phrase technology. Any transcriptional errors that result from this process are unintentional.

## 2018-03-09 ENCOUNTER — Other Ambulatory Visit (INDEPENDENT_AMBULATORY_CARE_PROVIDER_SITE_OTHER): Payer: Medicare Other

## 2018-03-09 ENCOUNTER — Telehealth: Payer: Self-pay | Admitting: Internal Medicine

## 2018-03-09 DIAGNOSIS — E118 Type 2 diabetes mellitus with unspecified complications: Secondary | ICD-10-CM

## 2018-03-09 LAB — COMPREHENSIVE METABOLIC PANEL
ALBUMIN: 4.3 g/dL (ref 3.5–5.2)
ALK PHOS: 61 U/L (ref 39–117)
ALT: 17 U/L (ref 0–53)
AST: 19 U/L (ref 0–37)
BUN: 16 mg/dL (ref 6–23)
CHLORIDE: 107 meq/L (ref 96–112)
CO2: 26 mEq/L (ref 19–32)
Calcium: 9.3 mg/dL (ref 8.4–10.5)
Creatinine, Ser: 1.15 mg/dL (ref 0.40–1.50)
GFR: 64.57 mL/min (ref >60.00)
Glucose, Bld: 57 mg/dL — ABNORMAL LOW (ref 70–99)
POTASSIUM: 4.7 meq/L (ref 3.5–5.1)
Sodium: 142 mEq/L (ref 135–145)
Total Bilirubin: 0.6 mg/dL (ref 0.2–1.2)
Total Protein: 7.1 g/dL (ref 6.0–8.3)

## 2018-03-09 LAB — LIPID PANEL
CHOLESTEROL: 97 mg/dL (ref 0–200)
HDL: 41.8 mg/dL (ref >39.00)
LDL CALC: 33 mg/dL (ref 0–99)
NonHDL: 55.5
Total CHOL/HDL Ratio: 2
Triglycerides: 111 mg/dL (ref 0.0–149.0)
VLDL: 22.2 mg/dL (ref 0.0–40.0)

## 2018-03-09 LAB — HEMOGLOBIN A1C: HEMOGLOBIN A1C: 7.8 % — AB (ref 4.6–6.5)

## 2018-03-09 NOTE — Telephone Encounter (Signed)
Pt dropped off blood sugar results. Paper is up front in Dr. Lupita Dawn colored folder.

## 2018-03-09 NOTE — Telephone Encounter (Signed)
Placed in results folder.  

## 2018-03-10 ENCOUNTER — Encounter: Payer: Self-pay | Admitting: Internal Medicine

## 2018-03-10 ENCOUNTER — Ambulatory Visit: Payer: Medicare Other | Admitting: Internal Medicine

## 2018-03-10 VITALS — BP 140/66 | HR 79 | Temp 97.9°F | Resp 15 | Ht 71.0 in | Wt 207.0 lb

## 2018-03-10 DIAGNOSIS — D509 Iron deficiency anemia, unspecified: Secondary | ICD-10-CM

## 2018-03-10 DIAGNOSIS — K529 Noninfective gastroenteritis and colitis, unspecified: Secondary | ICD-10-CM

## 2018-03-10 DIAGNOSIS — E1151 Type 2 diabetes mellitus with diabetic peripheral angiopathy without gangrene: Secondary | ICD-10-CM

## 2018-03-10 MED ORDER — MELOXICAM 15 MG PO TABS: 15.0000 mg | ORAL_TABLET | Freq: Every day | ORAL | 1 refills | Status: DC

## 2018-03-10 NOTE — Progress Notes (Signed)
Subjective:  Patient ID: Henry Lindsey, male    DOB: 05/13/1933  Age: 84 y.o. MRN: [id number]  CC: The primary encounter diagnosis was Iron deficiency anemia, unspecified iron deficiency anemia type. Diagnoses of Chronic iron deficiency anemia, Type 2 diabetes mellitus with diabetic peripheral angiopathy without gangrene, without long-term current use of insulin ([place]), and Chronic diarrhea were also pertinent to this visit.  HPI Henry Lindsey presents for urgent work in for pain and numbness in the left hand that started 3 weeks ago.[name]  He states that he has been  waking up at 3 am with left hand and occasionally the forearm feeling tight and numb . He sleeps on his back,  With his arms on his chest.  His symptoms are somewhat relieved by shaking his hands.   The symptoms are also aggravated by driving and by pushing the lawn mower. He denies neck pain/   he is no longer taking meloxicam  Which was prescribed by orthopedics in March     2) colitis: Recent colonoscopy done with biopsy,  Has been prescribed immodium  to take prn diarrhea if persists after stopping dairy for a week.   3) DM follow up  He has brought his home log of his blood sugars for the past month  .  He has reduced his dose of bid mixed insulin from 30 units to 20 units   Due to recurrent hypoglycemic events.   4) IDA:  Stopped taking  iron 4 weeks ago  Lab Results  Component Value Date   HGBA1C 7.8 (H) 03/09/2018     Outpatient Medications Prior to Visit  Medication Sig Dispense Refill  . albuterol (PROVENTIL HFA;VENTOLIN HFA) 108 (90 Base) MCG/ACT inhaler Inhale 1-2 puffs into the lungs every 6 (six) hours as needed for wheezing or shortness of breath (cough). 1 Inhaler 0  . Alum & Mag Hydroxide-Simeth (MAGIC MOUTHWASH) SOLN Take 5 mLs by mouth 3 (three) times daily as needed for mouth pain.    [name] aspirin 81 MG tablet Take 1 tablet (81 mg total) by mouth daily.    [name] atorvastatin (LIPITOR) 40 MG tablet TAKE 1 TABLET BY  MOUTH EVERY DAY 90 tablet 1  . Blood Glucose Monitoring Suppl (ONE TOUCH ULTRA SYSTEM KIT) w/Device KIT 1 kit by Does not apply route once. Use DX code E11.59 1 each 0  . Cholecalciferol (VITAMIN D3) 2000 units TABS Take 1 tablet by mouth daily.    . cilostazol (PLETAL) 100 MG tablet TAKE 1 TABLET(100 MG) BY MOUTH TWICE DAILY 180 tablet 1  . famotidine (PEPCID) 20 MG tablet TAKE 1 TABLET(20 MG) BY MOUTH TWICE DAILY 180 tablet 1  . fenofibrate micronized (LOFIBRA) 134 MG capsule TAKE 1 CAPSULE BY MOUTH EVERY MORNING BEFORE BREAKFAST 30 capsule 0  . ferrous fumarate-iron polysaccharide complex (TANDEM) 162-115.2 MG CAPS capsule Take 1 capsule by mouth daily. With orange juice or vitamin C 30 capsule 3  . gabapentin (NEURONTIN) 400 MG capsule TAKE 1 CAPSULE(400 MG) BY MOUTH THREE TIMES DAILY 270 capsule 1  . glipiZIDE (GLUCOTROL) 10 MG tablet TAKE 1 TABLET(10 MG) BY MOUTH TWICE DAILY BEFORE A MEAL 180 tablet 1  . HYDROcodone-acetaminophen (NORCO/VICODIN) 5-325 MG tablet Take 1 tablet by mouth 2 (two) times daily as needed for moderate pain (KNEE PAIN). 60 tablet 0  . HYDROcodone-acetaminophen (NORCO/VICODIN) 5-325 MG tablet Take 1 tablet by mouth 2 (two) times daily as needed for moderate pain (KNEE PAIN). 60 tablet 0  . HYDROcodone-acetaminophen (  NORCO/VICODIN) 5-325 MG tablet Take 1 tablet by mouth 2 (two) times daily as needed for moderate pain (KNEE PAIN). 60 tablet 0  . INS SYRINGE/NEEDLE 1CC/28G (B-D INSULIN SYRINGE 1CC/28G) 28G X 1/2" 1 ML MISC USE TO ADMINISTER INSULIN DAILY 100 each 0  . insulin lispro protamine-lispro (HUMALOG MIX 75/25) (75-25) 100 UNIT/ML SUSP injection Inject 22 units right before  breakfast and  27 units before dinner 10 mL 5  . latanoprost (XALATAN) 0.005 % ophthalmic solution INSTILL 1 DROP INTO BOTH EYES QD  5  . linagliptin (TRADJENTA) 5 MG TABS tablet Take 1 tablet (5 mg total) by mouth daily. 90 tablet 1  . metoprolol succinate (TOPROL-XL) 100 MG 24 hr tablet TAKE 1  TABLET BY MOUTH EVERY DAY WITH OR IMMEDIATELY FOLLOWING A MEAL 90 tablet 1  . Multiple Vitamin (MULTIVITAMIN WITH MINERALS) TABS tablet Take 1 tablet by mouth daily.    . nitroGLYCERIN (NITROSTAT) 0.4 MG SL tablet Place 1 tablet (0.4 mg total) under the tongue every 5 (five) minutes as needed for chest pain. MAXIMUM 3 TABLETS 50 tablet 3  . ONE TOUCH ULTRA TEST test strip USE THREE TIMES DAILY AS DIRECTED 100 each 0  . ONETOUCH DELICA LANCETS 80H MISC Use three times daily to check blood sugar. 100 each 11  . pantoprazole (PROTONIX) 40 MG tablet TAKE 1 TABLET(40 MG) BY MOUTH TWICE DAILY BEFORE A MEAL 180 tablet 1  . tamsulosin (FLOMAX) 0.4 MG CAPS capsule TAKE 1 CAPSULE BY MOUTH EVERY DAY 90 capsule 1  . timolol (TIMOPTIC) 0.5 % ophthalmic solution INT 1 GTT IN OU QD  2  . tiotropium (SPIRIVA HANDIHALER) 18 MCG inhalation capsule Place 1 capsule (18 mcg total) into inhaler and inhale daily. 30 capsule 12  . meloxicam (MOBIC) 15 MG tablet TK 1 T PO QD WITH A MEAL  0  . metFORMIN (GLUCOPHAGE) 1000 MG tablet TAKE 1 TABLET BY MOUTH TWICE DAILY 180 tablet 0  . predniSONE (DELTASONE) 10 MG tablet 6 tablets on Day 1 , then reduce by 1 tablet daily until gone (Patient not taking: Reported on 03/10/2018) 21 tablet 0   No facility-administered medications prior to visit.     Review of Systems;  Patient denies headache, fevers, malaise, unintentional weight loss, skin rash, eye pain, sinus congestion and sinus pain, sore throat, dysphagia,  hemoptysis , cough, dyspnea, wheezing, chest pain, palpitations, orthopnea, edema, abdominal pain, nausea, melena, diarrhea, constipation, flank pain, dysuria, hematuria, urinary  Frequency, nocturia, numbness, tingling, seizures,  Focal weakness, Loss of consciousness,  Tremor, insomnia, depression, anxiety, and suicidal ideation.      Objective:  BP 140/66 (BP Location: Left Arm, Patient Position: Sitting, Cuff Size: Normal)   Pulse 79   Temp 97.9 F (36.6 C)  (Oral)   Resp 15   Ht 5' 11" (1.803 m)   Wt 207 lb (93.9 kg)   SpO2 94%   BMI 28.87 kg/m   BP Readings from Last 3 Encounters:  03/10/18 140/66  03/03/18 (!) 156/62  02/24/18 118/60    Wt Readings from Last 3 Encounters:  03/10/18 207 lb (93.9 kg)  03/03/18 208 lb 6.4 oz (94.5 kg)  02/24/18 206 lb 3.2 oz (93.5 kg)    General appearance: alert, cooperative and appears stated age Ears: normal TM's and external ear canals both ears Throat: lips, mucosa, and tongue normal; teeth and gums normal Neck: no adenopathy, no carotid bruit, supple, symmetrical, trachea midline and thyroid not enlarged, symmetric, no tenderness/mass/nodules Back: symmetric,  no curvature. ROM normal. No CVA tenderness. Lungs: clear to auscultation bilaterally Heart: regular rate and rhythm, S1, S2 normal, no murmur, click, rub or gallop Abdomen: soft, non-tender; bowel sounds normal; no masses,  no organomegaly Pulses: 2+ and symmetric Skin: Skin color, texture, turgor normal. No rashes or lesions Lymph nodes: Cervical, supraclavicular, and axillary nodes normal.  Lab Results  Component Value Date   HGBA1C 7.8 (H) 03/09/2018   HGBA1C 8.2 (H) 12/09/2017   HGBA1C 9.0 (H) 08/06/2017    Lab Results  Component Value Date   CREATININE 1.15 03/09/2018   CREATININE 1.31 12/09/2017   CREATININE 1.04 10/13/2017    Lab Results  Component Value Date   WBC 9.1 12/09/2017   HGB 12.1 (L) 12/09/2017   HCT 38.4 (L) 12/09/2017   PLT 353.0 12/09/2017   GLUCOSE 57 (L) 03/09/2018   CHOL 97 03/09/2018   TRIG 111.0 03/09/2018   HDL 41.80 03/09/2018   LDLDIRECT 62.0 10/04/2016   LDLCALC 33 03/09/2018   ALT 17 03/09/2018   AST 19 03/09/2018   NA 142 03/09/2018   K 4.7 03/09/2018   CL 107 03/09/2018   CREATININE 1.15 03/09/2018   BUN 16 03/09/2018   CO2 26 03/09/2018   TSH 2.943 10/20/2016   PSA 0.83 10/10/2014   INR 1.22 11/21/2016   HGBA1C 7.8 (H) 03/09/2018   MICROALBUR 5.6 (H) 08/06/2017    No  results found.  Assessment & Plan:   Problem List Items Addressed This Visit    T2DM (type 2 diabetes mellitus) ([place])    Improved control but not at goal on reduced dose of  20 units of mixed insulin bid,  Advised to increase evening dose to 22 units, continue tradjenta , metformin and glipizide. . If hypoglycemia occurs,  Will stop glipizide       Chronic iron deficiency anemia    First noted in June 2017 and Improved but not resolve with iron supplementation.  Diagnostic  Colonoscopy was done last month and biopsies confirmed colitis. Repeat iorn and CBC are due  Lab Results  Component Value Date   WBC 9.1 12/09/2017   HGB 12.1 (L) 12/09/2017   HCT 38.4 (L) 12/09/2017   MCV 77.5 (L) 12/09/2017   PLT 353.0 12/09/2017   Lab Results  Component Value Date   IRON 26 (L) 10/07/2017   TIBC 435 (H) 10/07/2017   FERRITIN 16 (L) 10/07/2017         Chronic diarrhea    With colitis noted on colonoscopy biopsy samples.  Advised by GI to abstain from dairy for one week.        Other Visit Diagnoses    Iron deficiency anemia, unspecified iron deficiency anemia type    -  Primary   Relevant Orders   Iron, TIBC and Ferritin Panel   CBC with Differential/Platelet    A total of 40 minutes of face to face time was spent with patient more than half of which was spent in counselling and coordination of care   I have discontinued Christopherjame P. Wilcher's meloxicam, predniSONE, and metFORMIN. I am also having him start on meloxicam. Additionally, I am having him maintain his magic mouthwash, ONE TOUCH ULTRA SYSTEM KIT, ONETOUCH DELICA LANCETS 18A, latanoprost, INS SYRINGE/NEEDLE 1CC/28G, fenofibrate micronized, nitroGLYCERIN, multivitamin with minerals, aspirin, Vitamin D3, tamsulosin, ONE TOUCH ULTRA TEST, insulin lispro protamine-lispro, ferrous fumarate-iron polysaccharide complex, famotidine, linagliptin, albuterol, tiotropium, metoprolol succinate, timolol, gabapentin, pantoprazole, cilostazol,  glipiZIDE, atorvastatin, HYDROcodone-acetaminophen, HYDROcodone-acetaminophen, and HYDROcodone-acetaminophen.  Meds ordered  this encounter  Medications  . meloxicam (MOBIC) 15 MG tablet    Sig: Take 1 tablet (15 mg total) by mouth daily.    Dispense:  30 tablet    Refill:  1    Medications Discontinued During This Encounter  Medication Reason  . predniSONE (DELTASONE) 10 MG tablet Completed Course  . metFORMIN (GLUCOPHAGE) 1000 MG tablet   . meloxicam (MOBIC) 15 MG tablet     Follow-up: Return in about 1 month (around 04/07/2018).   [name], MD

## 2018-03-10 NOTE — Patient Instructions (Addendum)
Your diabetes control is improving but not at goal yet.  Increase your  evening dose of insulin to  22 units before dinner   Continue 20 units of insulin in the morning   Stop the metformin   You can resume the meloxicam  Once daily to see if it helps your left wrist  If not, ,you will need to see a specialist to rule out carpal tunnel syndrome    We need to recheck your iron and CBC tomorrow

## 2018-03-11 ENCOUNTER — Other Ambulatory Visit (INDEPENDENT_AMBULATORY_CARE_PROVIDER_SITE_OTHER): Payer: Medicare Other

## 2018-03-11 DIAGNOSIS — D509 Iron deficiency anemia, unspecified: Secondary | ICD-10-CM | POA: Diagnosis not present

## 2018-03-11 NOTE — Addendum Note (Signed)
Addended by: Leeanne Rio on: 03/11/2018 02:19 PM   Modules accepted: Orders

## 2018-03-11 NOTE — Addendum Note (Signed)
Addended by: Leeanne Rio on: 03/11/2018 02:20 PM   Modules accepted: Orders

## 2018-03-11 NOTE — Assessment & Plan Note (Signed)
Improved control but not at goal on reduced dose of  20 units of mixed insulin bid,  Advised to increase evening dose to 22 units, continue tradjenta , metformin and glipizide. . If hypoglycemia occurs,  Will stop glipizide

## 2018-03-11 NOTE — Assessment & Plan Note (Signed)
With colitis noted on colonoscopy biopsy samples.  Advised by GI to abstain from dairy for one week.

## 2018-03-11 NOTE — Assessment & Plan Note (Signed)
First noted in June 2017 and Improved but not resolve with iron supplementation.  Diagnostic  Colonoscopy was done last month and biopsies confirmed colitis. Repeat iorn and CBC are due  Lab Results  Component Value Date   WBC 9.1 12/09/2017   HGB 12.1 (L) 12/09/2017   HCT 38.4 (L) 12/09/2017   MCV 77.5 (L) 12/09/2017   PLT 353.0 12/09/2017   Lab Results  Component Value Date   IRON 26 (L) 10/07/2017   TIBC 435 (H) 10/07/2017   FERRITIN 16 (L) 10/07/2017

## 2018-03-12 ENCOUNTER — Telehealth: Payer: Self-pay | Admitting: Gastroenterology

## 2018-03-12 LAB — CBC WITH DIFFERENTIAL/PLATELET
BASOS ABS: 39 {cells}/uL (ref 0–200)
Basophils Relative: 0.5 %
Eosinophils Absolute: 239 cells/uL (ref 15–500)
Eosinophils Relative: 3.1 %
HEMATOCRIT: 36.8 % — AB (ref 38.5–50.0)
Hemoglobin: 12 g/dL — ABNORMAL LOW (ref 13.2–17.1)
LYMPHS ABS: 2133 {cells}/uL (ref 850–3900)
MCH: 25.7 pg — AB (ref 27.0–33.0)
MCHC: 32.6 g/dL (ref 32.0–36.0)
MCV: 78.8 fL — AB (ref 80.0–100.0)
MPV: 9.9 fL (ref 7.5–12.5)
Monocytes Relative: 8.5 %
NEUTROS PCT: 60.2 %
Neutro Abs: 4635 cells/uL (ref 1500–7800)
Platelets: 289 10*3/uL (ref 140–400)
RBC: 4.67 10*6/uL (ref 4.20–5.80)
RDW: 16.1 % — AB (ref 11.0–15.0)
Total Lymphocyte: 27.7 %
WBC: 7.7 10*3/uL (ref 3.8–10.8)
WBCMIX: 655 {cells}/uL (ref 200–950)

## 2018-03-12 LAB — IRON,TIBC AND FERRITIN PANEL
%SAT: 8 % (calc) — ABNORMAL LOW (ref 15–60)
Ferritin: 18 ng/mL — ABNORMAL LOW (ref 20–380)
Iron: 33 ug/dL — ABNORMAL LOW (ref 50–180)
TIBC: 406 mcg/dL (calc) (ref 250–425)

## 2018-03-12 LAB — VITAMIN B12: Vitamin B-12: 718 pg/mL (ref 200–1100)

## 2018-03-12 NOTE — Telephone Encounter (Signed)
Not sure what medication he is talking about, but FYI.Marland KitchenMarland Kitchen

## 2018-03-12 NOTE — Telephone Encounter (Signed)
Pt left vm for Ginger he states tell Dr. Allen Norris things are getting better he cut out the rx Nexformine and things are getting firm

## 2018-03-13 ENCOUNTER — Other Ambulatory Visit: Payer: Self-pay | Admitting: Internal Medicine

## 2018-03-13 MED ORDER — FERROUS FUM-IRON POLYSACCH 162-115.2 MG PO CAPS: 1.0000 | ORAL_CAPSULE | Freq: Every day | ORAL | 3 refills | Status: DC

## 2018-03-16 ENCOUNTER — Other Ambulatory Visit: Payer: Self-pay | Admitting: Internal Medicine

## 2018-03-24 ENCOUNTER — Ambulatory Visit: Payer: Medicare Other | Admitting: Gastroenterology

## 2018-05-01 ENCOUNTER — Encounter: Payer: Self-pay | Admitting: Internal Medicine

## 2018-05-01 ENCOUNTER — Ambulatory Visit: Payer: Medicare Other | Admitting: Internal Medicine

## 2018-05-01 VITALS — BP 150/80 | HR 65 | Temp 97.7°F | Ht 71.0 in | Wt 201.0 lb

## 2018-05-01 DIAGNOSIS — E1151 Type 2 diabetes mellitus with diabetic peripheral angiopathy without gangrene: Secondary | ICD-10-CM

## 2018-05-01 DIAGNOSIS — M1712 Unilateral primary osteoarthritis, left knee: Secondary | ICD-10-CM | POA: Diagnosis not present

## 2018-05-01 DIAGNOSIS — E1142 Type 2 diabetes mellitus with diabetic polyneuropathy: Secondary | ICD-10-CM

## 2018-05-01 DIAGNOSIS — K529 Noninfective gastroenteritis and colitis, unspecified: Secondary | ICD-10-CM

## 2018-05-01 MED ORDER — HYDROCODONE-ACETAMINOPHEN 5-325 MG PO TABS: 1.0000 | ORAL_TABLET | Freq: Two times a day (BID) | ORAL | 0 refills | Status: DC | PRN

## 2018-05-01 MED ORDER — FIRST-DUKES MOUTHWASH MT SUSP: OROMUCOSAL | 1 refills | Status: DC

## 2018-05-01 NOTE — Progress Notes (Signed)
Subjective:  Patient ID: Henry Lindsey, male    DOB: [birth year]  Age: 84 y.o. MRN: [id number]  CC: The primary encounter diagnosis was Type 2 diabetes mellitus with diabetic peripheral angiopathy without gangrene, without long-term current use of insulin ([place]). Diagnoses of Chronic diarrhea, Diabetic polyneuropathy associated with type 2 diabetes mellitus ([place]), and Primary osteoarthritis of left knee were also pertinent to this visit.  HPI Henry Lindsey presents for 3 month follow up on diabetes, hypertension, hyperlipidemia, and chronic knee pain managed with Vicodin .  Patient has no complaints today.  Patient is following a low glycemic index diet and taking all prescribed medications regularly without side effects.  Did not tolerate metformin  due to persistent diarrhea  .  Did not bring sugar log with him today Fasting sugars have been under less than 140 most of the time and post he prandials have been under 160 except on rare occasions. Patient is exercising about 3 times per week and intentionally trying to lose weight .  Patient has had an eye exam in the last 12 months and checks feet regularly for signs of infection.  Patient does not walk barefoot outside,  And denies any numbness tingling or burning in feet. Patient is up to date on all recommended vaccinations  Functional diarrhea:  Persisted despite stopping all dairy plus metformin and still had diarrhea   patient has neuropathy affecting both feet  Needs podiatry   Chronic pain:  Managed with Vicodin  Lab Results  Component Value Date   HGBA1C 7.8 (H) 03/09/2018    Outpatient Medications Prior to Visit  Medication Sig Dispense Refill  . albuterol (PROVENTIL HFA;VENTOLIN HFA) 108 (90 Base) MCG/ACT inhaler Inhale 1-2 puffs into the lungs every 6 (six) hours as needed for wheezing or shortness of breath (cough). 1 Inhaler 0  . Alum & Mag Hydroxide-Simeth (MAGIC MOUTHWASH) SOLN Take 5 mLs by mouth 3 (three) times daily as needed  for mouth pain.    [name] aspirin 81 MG tablet Take 1 tablet (81 mg total) by mouth daily.    [name] atorvastatin (LIPITOR) 40 MG tablet TAKE 1 TABLET BY MOUTH EVERY DAY 90 tablet 1  . Blood Glucose Monitoring Suppl (ONE TOUCH ULTRA SYSTEM KIT) w/Device KIT 1 kit by Does not apply route once. Use DX code E11.59 1 each 0  . Cholecalciferol (VITAMIN D3) 2000 units TABS Take 1 tablet by mouth daily.    . cilostazol (PLETAL) 100 MG tablet TAKE 1 TABLET(100 MG) BY MOUTH TWICE DAILY 180 tablet 1  . famotidine (PEPCID) 20 MG tablet TAKE 1 TABLET(20 MG) BY MOUTH TWICE DAILY 180 tablet 1  . fenofibrate micronized (LOFIBRA) 134 MG capsule TAKE 1 CAPSULE BY MOUTH EVERY MORNING BEFORE BREAKFAST 30 capsule 0  . ferrous fumarate-iron polysaccharide complex (TANDEM) 162-115.2 MG CAPS capsule Take 1 capsule by mouth daily. With orange juice or vitamin C 30 capsule 3  . gabapentin (NEURONTIN) 400 MG capsule TAKE 1 CAPSULE(400 MG) BY MOUTH THREE TIMES DAILY 270 capsule 1  . glipiZIDE (GLUCOTROL) 10 MG tablet TAKE 1 TABLET(10 MG) BY MOUTH TWICE DAILY BEFORE A MEAL 180 tablet 1  . HYDROcodone-acetaminophen (NORCO/VICODIN) 5-325 MG tablet Take 1 tablet by mouth 2 (two) times daily as needed for moderate pain (KNEE PAIN). 60 tablet 0  . HYDROcodone-acetaminophen (NORCO/VICODIN) 5-325 MG tablet Take 1 tablet by mouth 2 (two) times daily as needed for moderate pain (KNEE PAIN). 60 tablet 0  . INS SYRINGE/NEEDLE 1CC/28G (B-D INSULIN  SYRINGE 1CC/28G) 28G X 1/2" 1 ML MISC USE TO ADMINISTER INSULIN DAILY 100 each 0  . insulin lispro protamine-lispro (HUMALOG MIX 75/25) (75-25) 100 UNIT/ML SUSP injection Inject 22 units right before  breakfast and  27 units before dinner 10 mL 5  . latanoprost (XALATAN) 0.005 % ophthalmic solution INSTILL 1 DROP INTO BOTH EYES QD  5  . linagliptin (TRADJENTA) 5 MG TABS tablet Take 1 tablet (5 mg total) by mouth daily. 90 tablet 1  . meloxicam (MOBIC) 15 MG tablet Take 1 tablet (15 mg total) by mouth  daily. 30 tablet 1  . metoprolol succinate (TOPROL-XL) 100 MG 24 hr tablet TAKE 1 TABLET BY MOUTH EVERY DAY WITH OR IMMEDIATELY FOLLOWING A MEAL 90 tablet 1  . Multiple Vitamin (MULTIVITAMIN WITH MINERALS) TABS tablet Take 1 tablet by mouth daily.    . nitroGLYCERIN (NITROSTAT) 0.4 MG SL tablet Place 1 tablet (0.4 mg total) under the tongue every 5 (five) minutes as needed for chest pain. MAXIMUM 3 TABLETS 50 tablet 3  . ONE TOUCH ULTRA TEST test strip USE THREE TIMES DAILY AS DIRECTED 100 each 2  . ONETOUCH DELICA LANCETS 31V MISC Use three times daily to check blood sugar. 100 each 11  . pantoprazole (PROTONIX) 40 MG tablet TAKE 1 TABLET(40 MG) BY MOUTH TWICE DAILY BEFORE A MEAL 180 tablet 1  . tamsulosin (FLOMAX) 0.4 MG CAPS capsule TAKE 1 CAPSULE BY MOUTH EVERY DAY 90 capsule 1  . timolol (TIMOPTIC) 0.5 % ophthalmic solution INT 1 GTT IN OU QD  2  . tiotropium (SPIRIVA HANDIHALER) 18 MCG inhalation capsule Place 1 capsule (18 mcg total) into inhaler and inhale daily. 30 capsule 12  . HYDROcodone-acetaminophen (NORCO/VICODIN) 5-325 MG tablet Take 1 tablet by mouth 2 (two) times daily as needed for moderate pain (KNEE PAIN). 60 tablet 0   No facility-administered medications prior to visit.     Review of Systems;  Patient denies headache, fevers, malaise, unintentional weight loss, skin rash, eye pain, sinus congestion and sinus pain, sore throat, dysphagia,  hemoptysis , cough, dyspnea, wheezing, chest pain, palpitations, orthopnea, edema, abdominal pain, nausea, melena, diarrhea, constipation, flank pain, dysuria, hematuria, urinary  Frequency, nocturia, numbness, tingling, seizures,  Focal weakness, Loss of consciousness,  Tremor, insomnia, depression, anxiety, and suicidal ideation.      Objective:  BP (!) 150/80   Pulse 65   Temp 97.7 F (36.5 C) (Oral)   Ht _0  (1.803 m)   Wt 201 lb (91.2 kg)   SpO2 95%   BMI 28.03 kg/m   BP Readings from Last 3 Encounters:  05/01/18 (!)  150/80  03/10/18 140/66  03/03/18 (!) 156/62    Wt Readings from Last 3 Encounters:  05/01/18 201 lb (91.2 kg)  03/10/18 207 lb (93.9 kg)  03/03/18 208 lb 6.4 oz (94.5 kg)    General appearance: alert, cooperative and appears stated age Ears: normal TM's and external ear canals both ears Throat: lips, mucosa, and tongue normal; teeth and gums normal Neck: no adenopathy, no carotid bruit, supple, symmetrical, trachea midline and thyroid not enlarged, symmetric, no tenderness/mass/nodules Back: symmetric, no curvature. ROM normal. No CVA tenderness. Lungs: clear to auscultation bilaterally Heart: regular rate and rhythm, S1, S2 normal, no murmur, click, rub or gallop Abdomen: soft, non-tender; bowel sounds normal; no masses,  no organomegaly Pulses: 2+ and symmetric Skin: Skin color, texture, turgor normal. No rashes or lesions Lymph nodes: Cervical, supraclavicular, and axillary nodes normal.  Lab Results  Component Value Date   HGBA1C 7.8 (H) 03/09/2018   HGBA1C 8.2 (H) 12/09/2017   HGBA1C 9.0 (H) 08/06/2017    Lab Results  Component Value Date   CREATININE 1.15 03/09/2018   CREATININE 1.31 12/09/2017   CREATININE 1.04 10/13/2017    Lab Results  Component Value Date   WBC 7.7 03/11/2018   HGB 12.0 (L) 03/11/2018   HCT 36.8 (L) 03/11/2018   PLT 289 03/11/2018   GLUCOSE 57 (L) 03/09/2018   CHOL 97 03/09/2018   TRIG 111.0 03/09/2018   HDL 41.80 03/09/2018   LDLDIRECT 62.0 10/04/2016   LDLCALC 33 03/09/2018   ALT 17 03/09/2018   AST 19 03/09/2018   NA 142 03/09/2018   K 4.7 03/09/2018   CL 107 03/09/2018   CREATININE 1.15 03/09/2018   BUN 16 03/09/2018   CO2 26 03/09/2018   TSH 2.943 10/20/2016   PSA 0.83 10/10/2014   INR 1.22 11/21/2016   HGBA1C 7.8 (H) 03/09/2018   MICROALBUR 5.6 (H) 08/06/2017    No results found.  Assessment & Plan:   Problem List Items Addressed This Visit    Chronic diarrhea    Multifactorial, lactose intolerant  Gi eval done.   Continue Immodium prn,  add Lactaid       Diabetic neuropathy ([place])    Foot exam is negative for ulcers but notable for decreased sensation . Referral to podiatry advised.       Left knee DJD    Still severe despite recent I/Ainjection of steroids  In right knee and prior left  knee replacement,   Continue vicodin  Daily . Refills given today for 3 months after Refill history confirmed via [place] Controlled Substance database, accessed by me today..      Relevant Medications   HYDROcodone-acetaminophen (NORCO/VICODIN) 5-325 MG tablet   T2DM (type 2 diabetes mellitus) ([place]) - Primary   Relevant Orders   Hemoglobin A1c   Comprehensive metabolic panel   Lipid panel     A total of 25 minutes of face to face time was spent with patient more than half of which was spent in counselling about the above mentioned conditions  and coordination of care  I am having Natnael P. Moulton start on FIRST-DUKES MOUTHWASH. I am also having him maintain his magic mouthwash, ONE TOUCH ULTRA SYSTEM KIT, ONETOUCH DELICA LANCETS 14N, latanoprost, INS SYRINGE/NEEDLE 1CC/28G, fenofibrate micronized, nitroGLYCERIN, multivitamin with minerals, aspirin, Vitamin D3, tamsulosin, insulin lispro protamine-lispro, famotidine, linagliptin, albuterol, tiotropium, metoprolol succinate, timolol, gabapentin, pantoprazole, cilostazol, glipiZIDE, atorvastatin, HYDROcodone-acetaminophen, HYDROcodone-acetaminophen, meloxicam, ferrous fumarate-iron polysaccharide complex, ONE TOUCH ULTRA TEST, and HYDROcodone-acetaminophen.  Meds ordered this encounter  Medications  . Diphenhyd-Hydrocort-Nystatin (FIRST-DUKES MOUTHWASH) SUSP    Sig: Swish 5 cc in mouth as needed or ulcers  And spit out    Dispense:  237 mL    Refill:  1  . DISCONTD: HYDROcodone-acetaminophen (NORCO/VICODIN) 5-325 MG tablet    Sig: Take 1 tablet by mouth 2 (two) times daily as needed for moderate pain (KNEE PAIN).    Dispense:  60 tablet    Refill:  0    May refill on  or after  June 29, 2018  . DISCONTD: HYDROcodone-acetaminophen (NORCO/VICODIN) 5-325 MG tablet    Sig: Take 1 tablet by mouth 2 (two) times daily as needed for moderate pain (KNEE PAIN).    Dispense:  60 tablet    Refill:  0    May refill on or after  July 29, 2018  .  HYDROcodone-acetaminophen (NORCO/VICODIN) 5-325 MG tablet    Sig: Take 1 tablet by mouth 2 (two) times daily as needed for moderate pain (KNEE PAIN).    Dispense:  60 tablet    Refill:  0    May refill on or after  August 28, 2018    Medications Discontinued During This Encounter  Medication Reason  . HYDROcodone-acetaminophen (NORCO/VICODIN) 5-325 MG tablet Reorder  . HYDROcodone-acetaminophen (NORCO/VICODIN) 5-325 MG tablet Reorder  . HYDROcodone-acetaminophen (NORCO/VICODIN) 5-325 MG tablet Reorder    Follow-up: Return in about 3 months (around 08/01/2018) for med refill, diabetes .   [name], MD

## 2018-05-01 NOTE — Patient Instructions (Addendum)
You can use immodium once or twice daily to prevent diarrhea    Try taking Lactaid (this is an OTC pill)  just before you eat any dairy . This may prevent the diarrhea from milk   Please set up a fasting  lab appointment on or after August 21 .    Please return to see me in 3 months   Lab Results  Component Value Date   HGBA1C 7.8 (H) 03/09/2018

## 2018-05-03 NOTE — Assessment & Plan Note (Signed)
Foot exam is negative for ulcers but notable for decreased sensation . Referral to podiatry advised.

## 2018-05-03 NOTE — Assessment & Plan Note (Signed)
Still severe despite recent I/Ainjection of steroids  In right knee and prior left  knee replacement,   Continue vicodin  Daily . Refills given today for 3 months after Refill history confirmed via [place] Controlled Substance database, accessed by me today.. 

## 2018-05-03 NOTE — Assessment & Plan Note (Signed)
Multifactorial, lactose intolerant  Gi eval done.  Continue Immodium prn,  add Lactaid

## 2018-05-08 ENCOUNTER — Other Ambulatory Visit (INDEPENDENT_AMBULATORY_CARE_PROVIDER_SITE_OTHER): Payer: Medicare Other

## 2018-05-08 ENCOUNTER — Encounter (INDEPENDENT_AMBULATORY_CARE_PROVIDER_SITE_OTHER): Payer: Medicare Other

## 2018-05-08 ENCOUNTER — Ambulatory Visit (INDEPENDENT_AMBULATORY_CARE_PROVIDER_SITE_OTHER): Payer: Medicare Other | Admitting: Vascular Surgery

## 2018-05-25 ENCOUNTER — Other Ambulatory Visit: Payer: Self-pay | Admitting: Internal Medicine

## 2018-05-25 DIAGNOSIS — E1151 Type 2 diabetes mellitus with diabetic peripheral angiopathy without gangrene: Secondary | ICD-10-CM

## 2018-06-11 ENCOUNTER — Other Ambulatory Visit (INDEPENDENT_AMBULATORY_CARE_PROVIDER_SITE_OTHER): Payer: Medicare Other

## 2018-06-11 DIAGNOSIS — E1151 Type 2 diabetes mellitus with diabetic peripheral angiopathy without gangrene: Secondary | ICD-10-CM | POA: Diagnosis not present

## 2018-06-11 LAB — COMPREHENSIVE METABOLIC PANEL
ALBUMIN: 4 g/dL (ref 3.5–5.2)
ALT: 17 U/L (ref 0–53)
AST: 17 U/L (ref 0–37)
Alkaline Phosphatase: 77 U/L (ref 39–117)
BUN: 18 mg/dL (ref 6–23)
CALCIUM: 9.2 mg/dL (ref 8.4–10.5)
CO2: 30 meq/L (ref 19–32)
CREATININE: 1.17 mg/dL (ref 0.40–1.50)
Chloride: 103 mEq/L (ref 96–112)
GFR: 63.26 mL/min (ref >60.00)
Glucose, Bld: 116 mg/dL — ABNORMAL HIGH (ref 70–99)
Potassium: 4.5 mEq/L (ref 3.5–5.1)
Sodium: 139 mEq/L (ref 135–145)
Total Bilirubin: 0.9 mg/dL (ref 0.2–1.2)
Total Protein: 6.6 g/dL (ref 6.0–8.3)

## 2018-06-11 LAB — LIPID PANEL
CHOLESTEROL: 94 mg/dL (ref 0–200)
HDL: 40.8 mg/dL (ref >39.00)
LDL Cholesterol: 38 mg/dL (ref 0–99)
NonHDL: 53.21
TRIGLYCERIDES: 78 mg/dL (ref 0.0–149.0)
Total CHOL/HDL Ratio: 2
VLDL: 15.6 mg/dL (ref 0.0–40.0)

## 2018-06-11 LAB — HEMOGLOBIN A1C: Hgb A1c MFr Bld: 7.7 % — ABNORMAL HIGH (ref 4.6–6.5)

## 2018-06-12 ENCOUNTER — Telehealth: Payer: Self-pay | Admitting: Internal Medicine

## 2018-06-12 NOTE — Telephone Encounter (Signed)
Patient aware of dose change  °

## 2018-06-12 NOTE — Telephone Encounter (Signed)
Please confirm that his insulin dose is still 22 units in the am and 27 units in the pm.  Before meals.  If yes,   Increase the morning insulin to 25 units and the evening dose to 30 units

## 2018-06-23 ENCOUNTER — Other Ambulatory Visit: Payer: Self-pay | Admitting: Internal Medicine

## 2018-07-20 LAB — HM DIABETES EYE EXAM

## 2018-07-27 ENCOUNTER — Other Ambulatory Visit: Payer: Self-pay | Admitting: Internal Medicine

## 2018-07-30 ENCOUNTER — Other Ambulatory Visit: Payer: Self-pay | Admitting: Internal Medicine

## 2018-08-02 IMAGING — DX DG CHEST 2V
2 series · 2 of 2 positions shown · non-contrast
Comparison: 08/07/2015

CLINICAL DATA: Hypoxia, chest pain at night.

EXAM:
CHEST  2 VIEW

[chest pa]
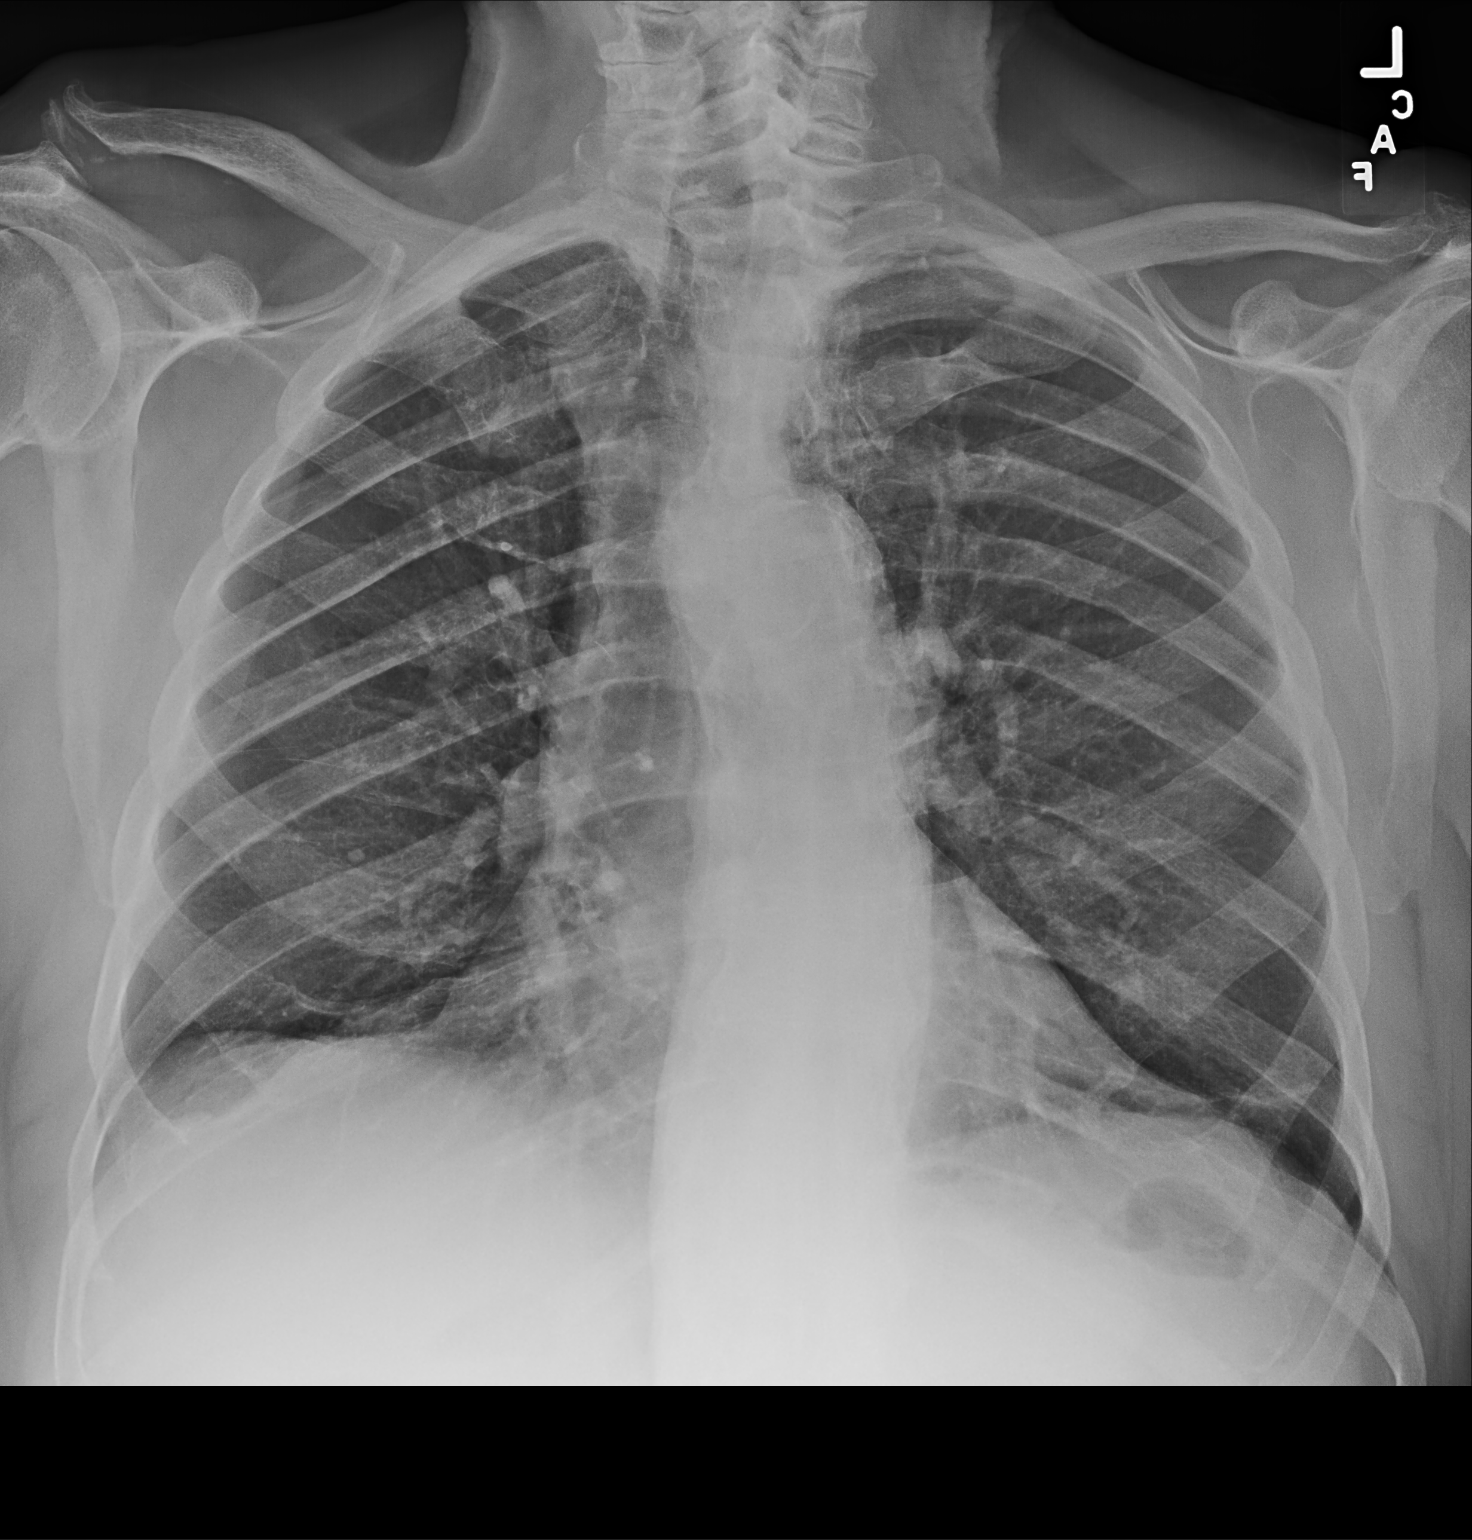

[chest lat]
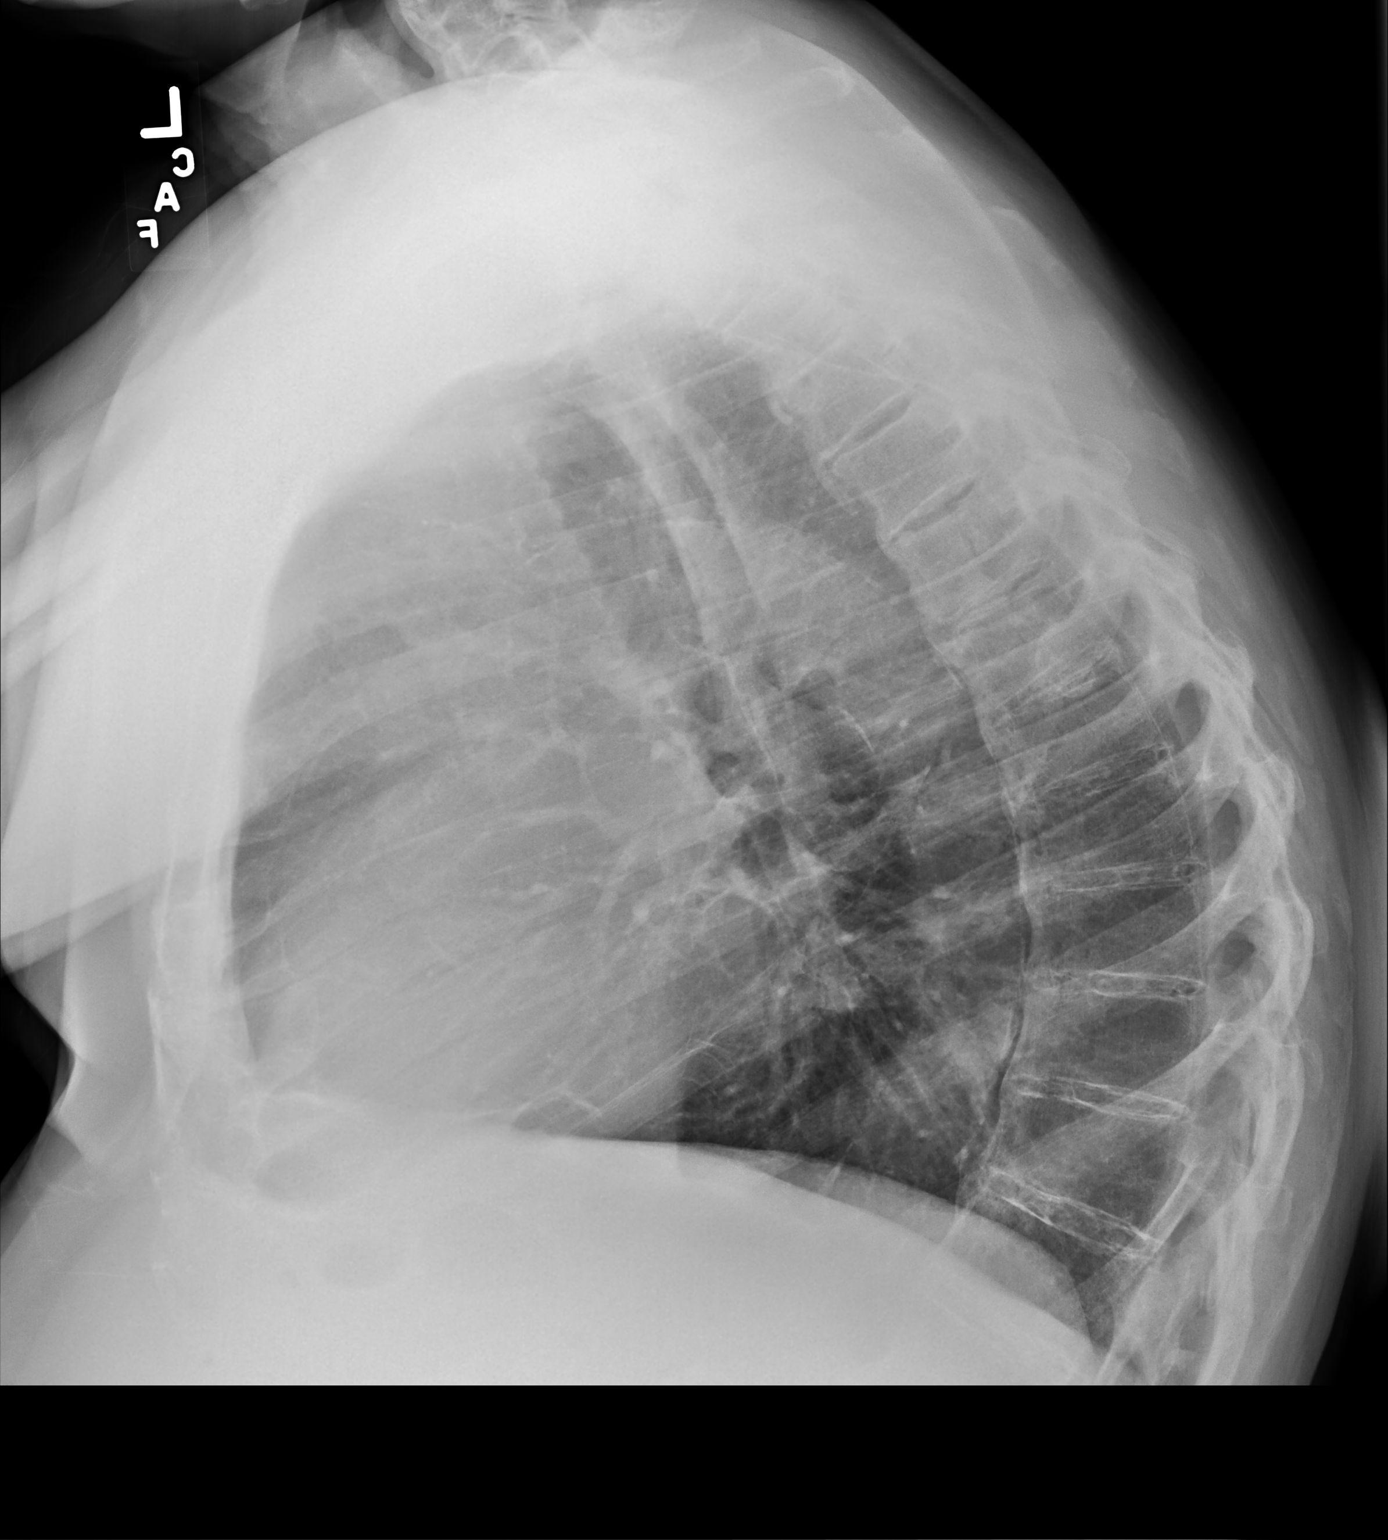

[2 of 2 positions shown; findings below may reference images not displayed]

FINDINGS: Cardiomediastinal silhouette is normal. Mediastinal contours appear
intact. Calcific atherosclerotic disease and tortuosity of the aorta
are noted.

There is no evidence of focal airspace consolidation, pleural
effusion or pneumothorax.

Osseous structures are without acute abnormality. Ankylosing changes
of the thoracic spine are noted. Soft tissues are grossly normal.
IMPRESSION: No active cardiopulmonary disease.

Calcific atherosclerotic disease of the aorta.

## 2018-08-04 ENCOUNTER — Telehealth: Payer: Self-pay | Admitting: Internal Medicine

## 2018-08-04 NOTE — Telephone Encounter (Signed)
Needs to make an office visit

## 2018-08-04 NOTE — Telephone Encounter (Signed)
Copied from [place] [id number]. Topic: General - Other >> Aug 04, 2018  9:56 AM Lennox Solders wrote: Reason for CRM:pt is calling to let dr Derrel Nip know his had tried immodium pills and liquids  to stop diarrhea. Pt has tried OTC . Pt has stop  drinking milk and yogurt and he is still have diarrhea. Pt had colonoscopy about a month the report was normal. Pt has had diarrhea for over 10 months. Pt would like rx for diarrhea. [address] in [place]. Pt has stopped taking metformin for about 2 months due to diarrhea. Pt tried one metformin pill last night and he had accident in the bed

## 2018-08-05 ENCOUNTER — Telehealth: Payer: Self-pay | Admitting: Internal Medicine

## 2018-08-05 ENCOUNTER — Telehealth: Payer: Self-pay | Admitting: *Deleted

## 2018-08-05 ENCOUNTER — Telehealth: Payer: Self-pay | Admitting: Gastroenterology

## 2018-08-05 IMAGING — MR MR ABDOMEN WO/W CM MRCP
9 of 18 series · 19 of 48 positions shown · IV contrast (multihance)
Comparison: No prior abdominal MRI. CT the abdomen and pelvis
10/05/2016.

CLINICAL DATA: 83-year-old male with worsening right upper quadrant
abdominal pain from acute cholecystitis, now status post
percutaneous drainage catheter placement.

EXAM:
MRI ABDOMEN WITHOUT AND WITH CONTRAST (INCLUDING MRCP)
TECHNIQUE: Multiplanar multisequence MR imaging of the abdomen was performed
both before and after the administration of intravenous contrast.
Heavily T2-weighted images of the biliary and pancreatic ducts were
obtained, and three-dimensional MRCP images were rendered by post
processing.
CONTRAST:  16 mL of MultiHance

[Series 4: T2 fat-sat · axial · 5.0mm · 0.78mm/px · z∈[-59,+201]mm · 3 of 53 slices shown]
[im 1/53]
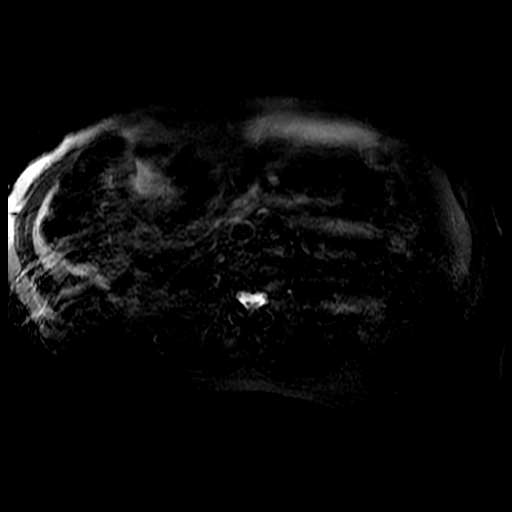
[im 27/53]
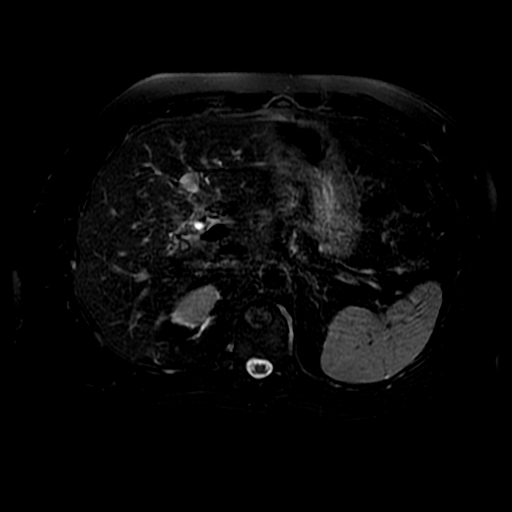
[im 53/53]
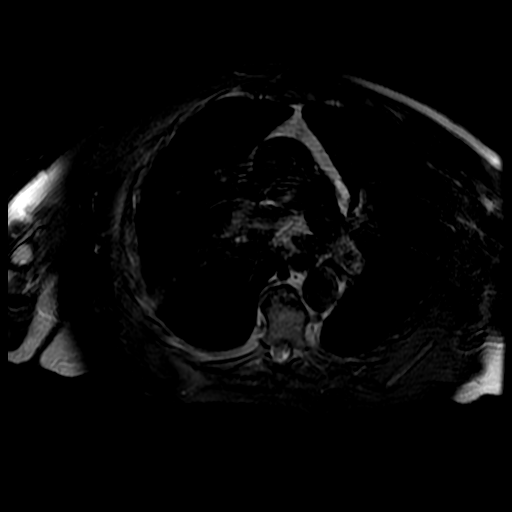

[Series 6: T2 · axial · 5.0mm · 0.78mm/px · z∈[-21,+194]mm · 2 of 44 slices shown (1 of 2)]
[im 1/44]
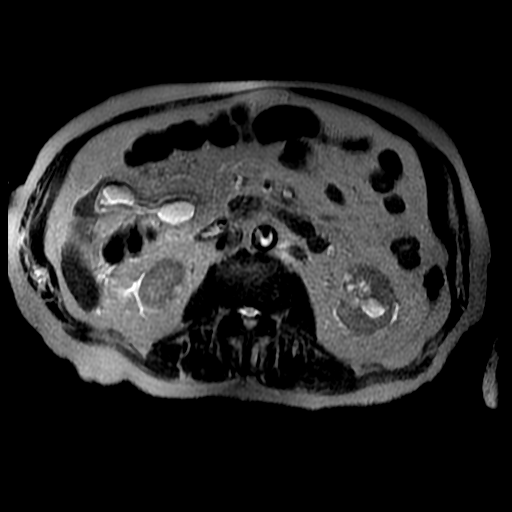
[im 44/44]
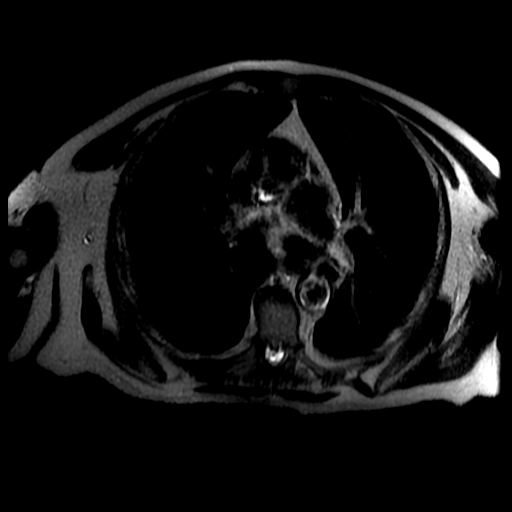

[Series 7: T2 · coronal · 5.0mm · 0.82mm/px · 2 of 45 slices shown (2 of 2)]
[im 1/45]
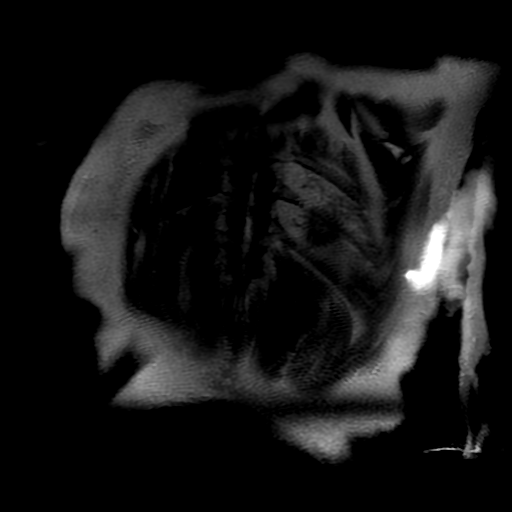
[im 45/45]
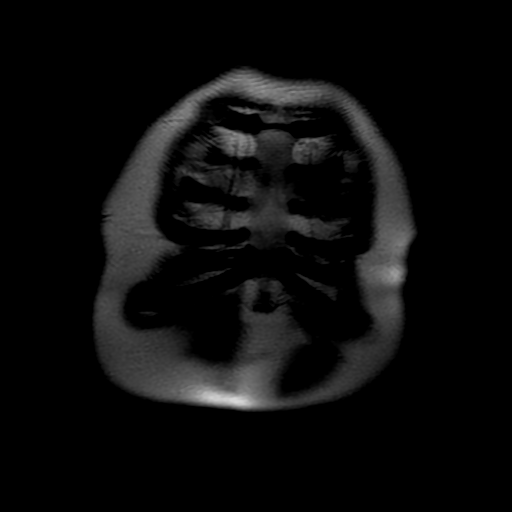

[Series 9: MRCP · coronal · 2.0mm · 0.70mm/px · 1 of 38 slices shown (1 of 2)]
[im 1/38]
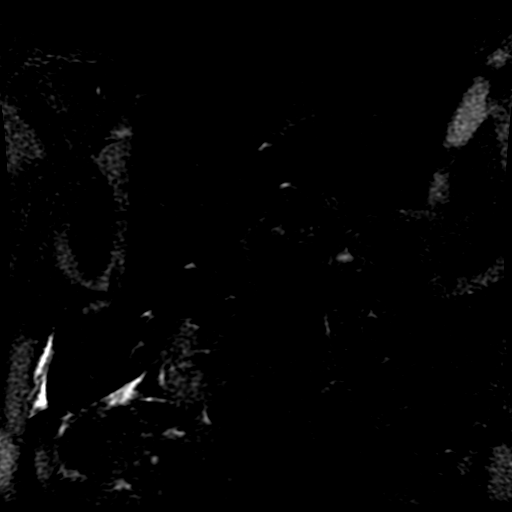

[Series 10: DWI b500 · axial · 6.0mm · 1.48mm/px · z∈[-31,+195]mm · 2 of 59 slices shown]
[im 1/59]
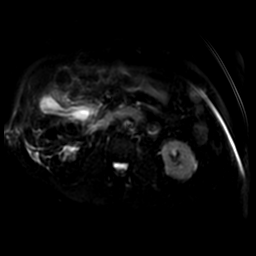
[im 59/59]
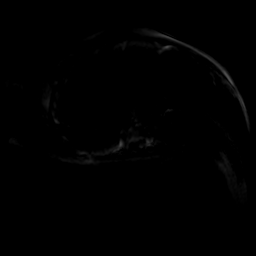

[Series 13: ax dualecho · axial · 5.0mm · 0.78mm/px · z∈[-62,+168]mm · 3 of 94 slices shown]
[im 1/94]
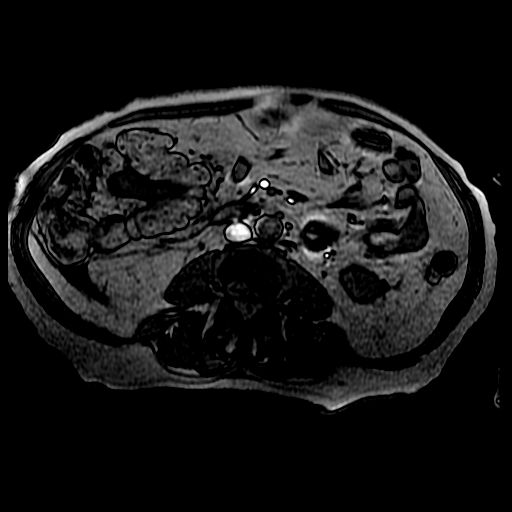
[im 47/94]
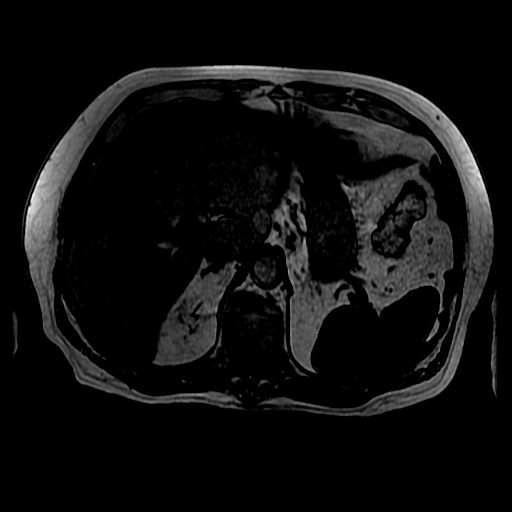
[im 94/94]
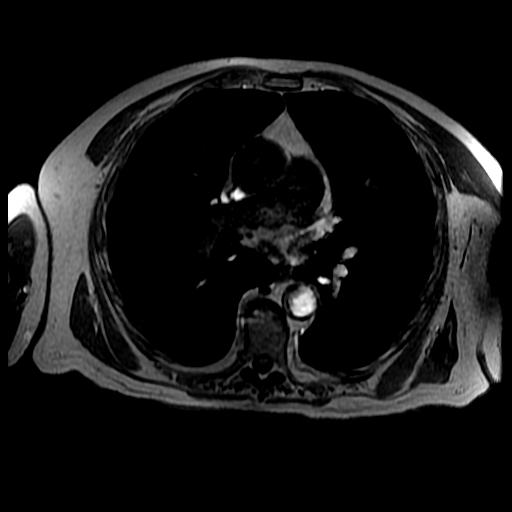

[Series 14: MRCP · oblique · 50.0mm · 0.70mm/px · 1 of 6 slices shown (2 of 2)]
[im 1/6]
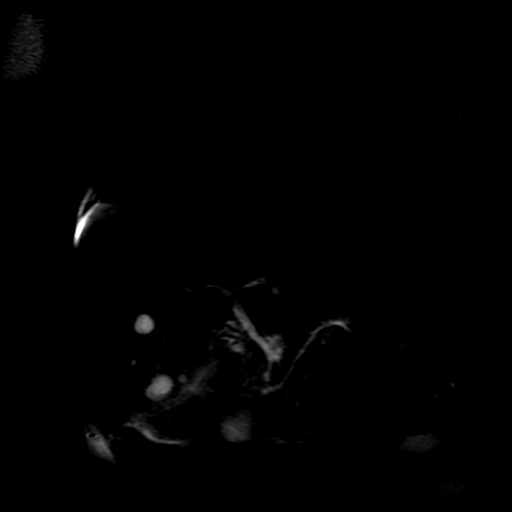

[Series 17: T1 dynamic post-contrast · coronal · 5.0mm · 0.86mm/px · 3 of 88 slices shown]
[im 1/88]
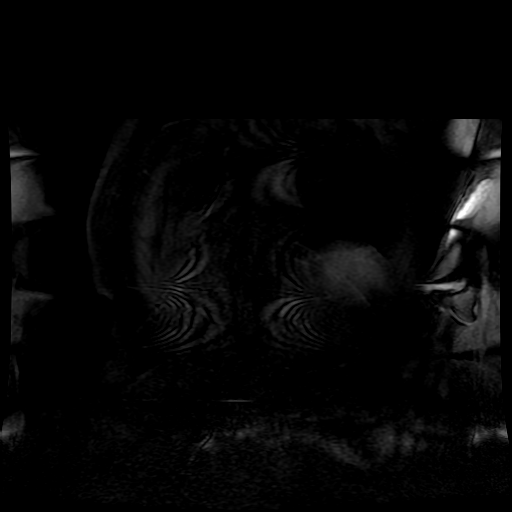
[im 44/88]
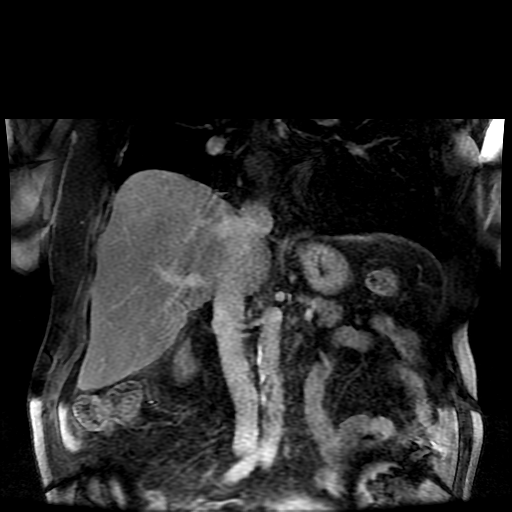
[im 88/88]
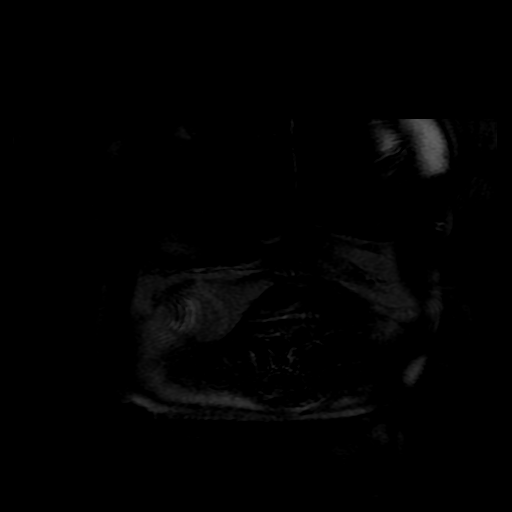

[Series 1500: T1 dynamic · axial · 5.0mm · 0.78mm/px · z∈[-49,+59]mm · 2 of 88 slices shown]
[im 1/88]
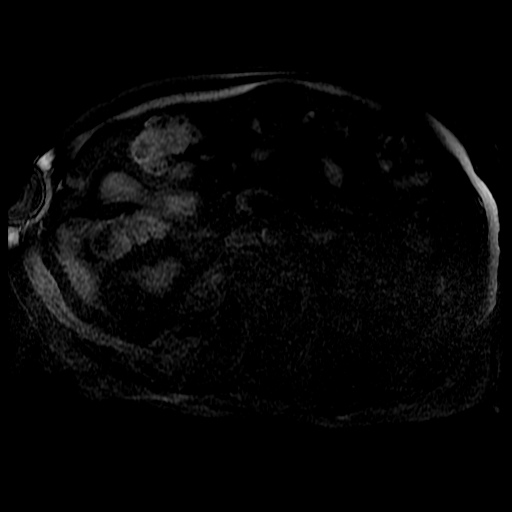
[im 44/88]
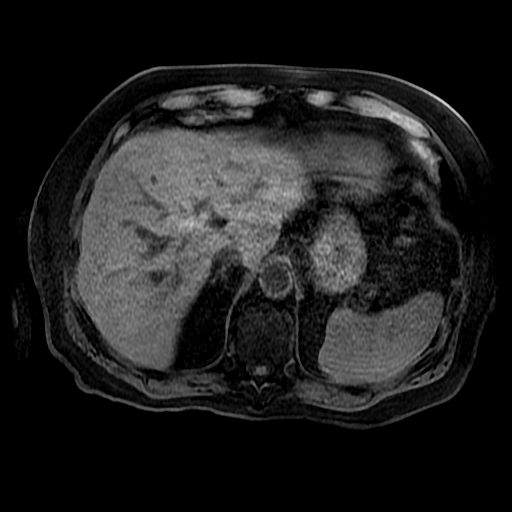

[19 of 48 positions shown; findings below may reference images not displayed]

FINDINGS: Lower chest: Linear signal intensity in the right lower lobe,
favored to reflect subsegmental atelectasis.

Hepatobiliary: Heterogeneous loss of signal intensity throughout the
hepatic parenchyma on out of phase dual echo images, compatible with
a background of hepatic steatosis. No suspicious cystic or solid
hepatic lesions. MRCP images demonstrate no intra or extrahepatic
biliary ductal dilatation. New percutaneous cholecystostomy tube in
position. Gallbladder is completely decompressed. Increased T2
signal intensity in and around the gallbladder wall, compatible
with. Inflammation from acute cholecystitis. Inferior aspect of the
gallbladder is incompletely visualized. No filling defect within the
common bile duct on MRCP images to suggest choledocholithiasis.

Pancreas: No pancreatic mass. No pancreatic ductal dilatation. No
pancreatic or peripancreatic fluid or inflammatory changes.

Spleen:  Unremarkable.

Adrenals/Urinary Tract: There are several well-defined T1
hypointense, T2 hyperintense, nonenhancing lesions in the kidneys
bilaterally measuring up to 2.1 cm in the interpolar region of the
right kidney, compatible with simple cysts. No hydroureteronephrosis
in the visualized portions of the abdomen. Bilateral adrenal glands
are normal in appearance.

Stomach/Bowel: Visualized portions are unremarkable.

Vascular/Lymphatic: Extensive atherosclerosis in the visualized
abdominal vasculature, including an ulcerated plaque in the
suprarenal/renal abdominal aorta. No aneurysm identified in the
visualized abdominal vasculature. Retroaortic left renal vein
(normal anatomical variant) incidentally noted. No lymphadenopathy
noted in the visualized abdomen.

Other: Trace amount of pericholecystic fluid extending beneath the
right lobe of the liver. Otherwise, there is no significant volume
of ascites noted in the visualized portions of the peritoneal
cavity.

Musculoskeletal: No aggressive appearing osseous lesions are noted
in the visualized portions of the skeleton.
IMPRESSION: 1. Status post recent percutaneous cholecystostomy tube placement
with complete decompression of the inflamed gallbladder.
2. No choledocholithiasis. No evidence of biliary tract obstruction
at this time.
3. Hepatic steatosis.
4. Aortic atherosclerosis, including ulcerated plaque in the upper
abdominal aorta, as above.
5. Additional incidental findings, as above.

## 2018-08-05 NOTE — Telephone Encounter (Signed)
Copied from [place] [phone]. Topic: Appointment Scheduling - Scheduling Inquiry for Clinic >> Aug 05, 2018  2:37 PM Burchel, Abbi R wrote: Pt needs an appt per Dr Derrel Nip re: ongoing diarrhea. Dr Lupita Dawn 1st available is 11/27.  Please call pt to work in sooner if possible.  Pt: [phone]

## 2018-08-05 NOTE — Telephone Encounter (Signed)
Copied from [place] [phone]. Topic: General - Other >> Aug 04, 2018  9:56 AM Lennox Solders wrote: Reason for CRM:pt is calling to let dr Derrel Nip know his had tried immodium pills and liquids  to stop diarrhea. Pt has tried OTC . Pt has stop  drinking milk and yogurt and he is still have diarrhea. Pt had colonoscopy about a month the report was normal. Pt has had diarrhea for over 10 months. Pt would like rx for diarrhea. [facility] [address] >> Aug 05, 2018 10:23 AM Carolyn Stare wrote:  Pt said he has figured out why is having the diarrh he said every since she decreased his hydrocodone he has had diarrh and is asking if he can be put back on 3 pills of the hydrocodone. He said if he can not be put back on 3 pills a day he will need a RX called in to help with the diarrh     HYDROcodone-acetaminophen (NORCO/VICODIN) 5-325 MG tablet

## 2018-08-05 NOTE — Telephone Encounter (Signed)
appt has been made 

## 2018-08-05 NOTE — Telephone Encounter (Signed)
He just wanted Dr Allen Norris to know that he was fine before she changes his meds.

## 2018-08-05 NOTE — Telephone Encounter (Signed)
I scheduled patient for 10/21 at 430. Patient is not having any other symptoms and the diarrhea has been ongoing. He had a colonoscopy done and they did not find anything. He requested an appt with Dr. Derrel Nip

## 2018-08-05 NOTE — Telephone Encounter (Signed)
Pt is scheduled for Monday 08/10/2018

## 2018-08-05 NOTE — Telephone Encounter (Signed)
Patient called & would like to let you know he thinks he has figured out why he has diarrhea. Dr Derrel Nip had him on 3 Hydrocodone & 3 Metformin. He had the colonoscopy & stopped taking the Metformin. Dr Derrel Nip reduce his Hydrocodone to 2 a day.

## 2018-08-10 ENCOUNTER — Encounter: Payer: Self-pay | Admitting: Internal Medicine

## 2018-08-10 ENCOUNTER — Ambulatory Visit: Payer: Medicare Other | Admitting: Internal Medicine

## 2018-08-10 DIAGNOSIS — K529 Noninfective gastroenteritis and colitis, unspecified: Secondary | ICD-10-CM

## 2018-08-10 MED ORDER — CILOSTAZOL 100 MG PO TABS: ORAL_TABLET | ORAL | 0 refills | Status: DC

## 2018-08-10 NOTE — Assessment & Plan Note (Addendum)
Chronic, functional  ,  Post cholecystectomy.  Colonoscopy showed acitve colitis.  Patient advised  To stop mobic,  Metformin,  And dairy  And use immodium as needed (Dr Allen Norris)  For the next week.  His request for Lomotil was denied due to his age and the risk of prolonged use of the medication .  He may benefit from a trial of  cholestyramine.

## 2018-08-10 NOTE — Progress Notes (Signed)
Subjective:  Patient ID: Henry Lindsey, male    DOB: 13-Sep-1933  Age: 84 y.o. MRN: [id number]  CC: The encounter diagnosis was Chronic diarrhea.  HPI [name] presents for treatment of persistent  Colitis. Patient was referred to [facility] several months ago for eval and  Treatment of chronic diarrhea.  Colonoscopy was done and active colitis was noted. formHe was advised to stop NSAIDs, metformin and dairy and to use Imodium prn.   He continues to have 3-4 loose stools daily , but has not followed  Up with GI.  States that he Stopped mobic but took a dose last week and diarrhea worsened .  Stopped metformin weeks ago.  Has not stopped dairy.  Thinks the diarrhea became worse when I reduced his hydrocodone use to #2 talbts daily.  Pain is controlled on this dose    Outpatient Medications Prior to Visit  Medication Sig Dispense Refill  . albuterol (PROVENTIL HFA;VENTOLIN HFA) 108 (90 Base) MCG/ACT inhaler Inhale 1-2 puffs into the lungs every 6 (six) hours as needed for wheezing or shortness of breath (cough). 1 Inhaler 0  . Alum & Mag Hydroxide-Simeth (MAGIC MOUTHWASH) SOLN Take 5 mLs by mouth 3 (three) times daily as needed for mouth pain.    [name] aspirin 81 MG tablet Take 1 tablet (81 mg total) by mouth daily.    [name] atorvastatin (LIPITOR) 40 MG tablet TAKE 1 TABLET BY MOUTH EVERY DAY 90 tablet 0  . Blood Glucose Monitoring Suppl (ONE TOUCH ULTRA SYSTEM KIT) w/Device KIT 1 kit by Does not apply route once. Use DX code E11.59 1 each 0  . Cholecalciferol (VITAMIN D3) 2000 units TABS Take 1 tablet by mouth daily.    . Diphenhyd-Hydrocort-Nystatin (FIRST-DUKES MOUTHWASH) SUSP Swish 5 cc in mouth as needed or ulcers  And spit out 237 mL 1  . famotidine (PEPCID) 20 MG tablet TAKE 1 TABLET(20 MG) BY MOUTH TWICE DAILY 180 tablet 1  . fenofibrate micronized (LOFIBRA) 134 MG capsule TAKE 1 CAPSULE BY MOUTH EVERY MORNING BEFORE BREAKFAST 30 capsule 0  . gabapentin (NEURONTIN) 400 MG capsule TAKE 1  CAPSULE(400 MG) BY MOUTH THREE TIMES DAILY 270 capsule 0  . glipiZIDE (GLUCOTROL) 10 MG tablet TAKE 1 TABLET(10 MG) BY MOUTH TWICE DAILY BEFORE A MEAL 180 tablet 0  . HYDROcodone-acetaminophen (NORCO/VICODIN) 5-325 MG tablet Take 1 tablet by mouth 2 (two) times daily as needed for moderate pain (KNEE PAIN). 60 tablet 0  . HYDROcodone-acetaminophen (NORCO/VICODIN) 5-325 MG tablet Take 1 tablet by mouth 2 (two) times daily as needed for moderate pain (KNEE PAIN). 60 tablet 0  . HYDROcodone-acetaminophen (NORCO/VICODIN) 5-325 MG tablet Take 1 tablet by mouth 2 (two) times daily as needed for moderate pain (KNEE PAIN). 60 tablet 0  . INS SYRINGE/NEEDLE 1CC/28G (B-D INSULIN SYRINGE 1CC/28G) 28G X 1/2" 1 ML MISC USE TO ADMINISTER INSULIN DAILY 100 each 0  . insulin lispro protamine-lispro (HUMALOG MIX 75/25) (75-25) 100 UNIT/ML SUSP injection Inject 22 units right before  breakfast and  27 units before dinner 10 mL 5  . insulin lispro protamine-lispro (HUMALOG MIX 75/25) (75-25) 100 UNIT/ML SUSP injection INJECT 20 UNITS UNDER THE SKIN RIGHT BEFORE BREAKFAST AND 25 UNITS UNDER THE SKIN BEFORE DINNER 10 mL 2  . latanoprost (XALATAN) 0.005 % ophthalmic solution INSTILL 1 DROP INTO BOTH EYES QD  5  . linagliptin (TRADJENTA) 5 MG TABS tablet Take 1 tablet (5 mg total) by mouth daily. 90 tablet 1  .  meloxicam (MOBIC) 15 MG tablet Take 1 tablet (15 mg total) by mouth daily. 30 tablet 1  . metoprolol succinate (TOPROL-XL) 100 MG 24 hr tablet TAKE 1 TABLET BY MOUTH EVERY DAY WITH OR IMMEDIATELY FOLLOWING A MEAL 90 tablet 0  . Multiple Vitamin (MULTIVITAMIN WITH MINERALS) TABS tablet Take 1 tablet by mouth daily.    . nitroGLYCERIN (NITROSTAT) 0.4 MG SL tablet Place 1 tablet (0.4 mg total) under the tongue every 5 (five) minutes as needed for chest pain. MAXIMUM 3 TABLETS 50 tablet 3  . ONE TOUCH ULTRA TEST test strip USE THREE TIMES DAILY AS DIRECTED 100 each 2  . ONETOUCH DELICA LANCETS 68Y MISC Use three times  daily to check blood sugar. 100 each 11  . pantoprazole (PROTONIX) 40 MG tablet TAKE 1 TABLET(40 MG) BY MOUTH TWICE DAILY BEFORE A MEAL 180 tablet 0  . tamsulosin (FLOMAX) 0.4 MG CAPS capsule TAKE 1 CAPSULE BY MOUTH EVERY DAY 90 capsule 1  . TANDEM 162-115.2 MG CAPS capsule TAKE 1 CAPSULE BY MOUTH EVERY DAY**TAKE WITH ORANGE JUICE OR VITAMIN CAPSULE** 30 capsule 0  . timolol (TIMOPTIC) 0.5 % ophthalmic solution INT 1 GTT IN OU QD  2  . cilostazol (PLETAL) 100 MG tablet TAKE 1 TABLET(100 MG) BY MOUTH TWICE DAILY 180 tablet 0  . tiotropium (SPIRIVA HANDIHALER) 18 MCG inhalation capsule Place 1 capsule (18 mcg total) into inhaler and inhale daily. (Patient not taking: Reported on 08/10/2018) 30 capsule 12   No facility-administered medications prior to visit.     Review of Systems;  Patient denies headache, fevers, malaise, unintentional weight loss, skin rash, eye pain, sinus congestion and sinus pain, sore throat, dysphagia,  hemoptysis , cough, dyspnea, wheezing, chest pain, palpitations, orthopnea, edema, abdominal pain, nausea, melena, diarrhea, constipation, flank pain, dysuria, hematuria, urinary  Frequency, nocturia, numbness, tingling, seizures,  Focal weakness, Loss of consciousness,  Tremor, insomnia, depression, anxiety, and suicidal ideation.      Objective:  BP 124/62 (BP Location: Left Arm, Patient Position: Sitting, Cuff Size: Normal)   Pulse 60   Temp 98.2 F (36.8 C) (Oral)   Resp 15   Ht '5\' 11"'  (1.803 m)   Wt 195 lb 3.2 oz (88.5 kg)   SpO2 95%   BMI 27.22 kg/m   BP Readings from Last 3 Encounters:  08/10/18 124/62  05/01/18 (!) 150/80  03/10/18 140/66    Wt Readings from Last 3 Encounters:  08/10/18 195 lb 3.2 oz (88.5 kg)  05/01/18 201 lb (91.2 kg)  03/10/18 207 lb (93.9 kg)    General appearance: alert, cooperative and appears stated age Ears: normal TM's and external ear canals both ears Throat: lips, mucosa, and tongue normal; teeth and gums  normal Neck: no adenopathy, no carotid bruit, supple, symmetrical, trachea midline and thyroid not enlarged, symmetric, no tenderness/mass/nodules Back: symmetric, no curvature. ROM normal. No CVA tenderness. Lungs: clear to auscultation bilaterally Heart: regular rate and rhythm, S1, S2 normal, no murmur, click, rub or gallop Abdomen: soft, non-tender; bowel sounds normal; no masses,  no organomegaly Pulses: 2+ and symmetric Skin: Skin color, texture, turgor normal. No rashes or lesions Lymph nodes: Cervical, supraclavicular, and axillary nodes normal.  Lab Results  Component Value Date   HGBA1C 7.7 (H) 06/11/2018   HGBA1C 7.8 (H) 03/09/2018   HGBA1C 8.2 (H) 12/09/2017    Lab Results  Component Value Date   CREATININE 1.17 06/11/2018   CREATININE 1.15 03/09/2018   CREATININE 1.31 12/09/2017    Lab  Results  Component Value Date   WBC 7.7 03/11/2018   HGB 12.0 (L) 03/11/2018   HCT 36.8 (L) 03/11/2018   PLT 289 03/11/2018   GLUCOSE 116 (H) 06/11/2018   CHOL 94 06/11/2018   TRIG 78.0 06/11/2018   HDL 40.80 06/11/2018   LDLDIRECT 62.0 10/04/2016   LDLCALC 38 06/11/2018   ALT 17 06/11/2018   AST 17 06/11/2018   NA 139 06/11/2018   K 4.5 06/11/2018   CL 103 06/11/2018   CREATININE 1.17 06/11/2018   BUN 18 06/11/2018   CO2 30 06/11/2018   TSH 2.943 10/20/2016   PSA 0.83 10/10/2014   INR 1.22 11/21/2016   HGBA1C 7.7 (H) 06/11/2018   MICROALBUR 5.6 (H) 08/06/2017    No results found.  Assessment & Plan:   Problem List Items Addressed This Visit    Chronic diarrhea    Chronic, functional  ,  Post cholecystectomy.  Colonoscopy showed acitve colitis.  Patient advised  To stop mobic,  Metformin,  And dairy  And use immodium as needed (Dr Allen Norris)  For the next week.  His request for Lomotil was denied due to his age and the risk of prolonged use of the medication .  He may benefit from a trial of  cholestyramine.          I have discontinued Mable P. Coller's tiotropium.  I am also having him maintain his magic mouthwash, ONE TOUCH ULTRA SYSTEM KIT, ONETOUCH DELICA LANCETS 78S, latanoprost, INS SYRINGE/NEEDLE 1CC/28G, fenofibrate micronized, nitroGLYCERIN, multivitamin with minerals, aspirin, Vitamin D3, tamsulosin, insulin lispro protamine-lispro, famotidine, linagliptin, albuterol, timolol, HYDROcodone-acetaminophen, HYDROcodone-acetaminophen, meloxicam, ONE TOUCH ULTRA TEST, FIRST-DUKES MOUTHWASH, HYDROcodone-acetaminophen, insulin lispro protamine-lispro, metoprolol succinate, gabapentin, atorvastatin, TANDEM, pantoprazole, glipiZIDE, and cilostazol.  Meds ordered this encounter  Medications  . cilostazol (PLETAL) 100 MG tablet    Sig: TAKE 1 TABLET(100 MG) BY MOUTH TWICE DAILY    Dispense:  180 tablet    Refill:  0    SECOND ATTEMPT AT REFILL (SENT ON OCT 8 )    Medications Discontinued During This Encounter  Medication Reason  . tiotropium (SPIRIVA HANDIHALER) 18 MCG inhalation capsule Patient has not taken in last 30 days  . cilostazol (PLETAL) 100 MG tablet Reorder    Follow-up: No follow-ups on file.   [name], MD

## 2018-08-10 NOTE — Patient Instructions (Addendum)
You need stop ALL OF THE FOLLOWING  TO CLEAR UP THE DIARRHEA :  MELOXICAM ( AS WELL AS MOTRIN AND ALEVE) METFORMIN YOGURT MADE FROM COW'S MILK  ALL MILK PRODUCTS    DURING THIS WEEK,  YOU MAY USE IMMODIUM AFTER EVERY LOOSE STOOL  YOU CAN ALSO ADD METAMUCIL OR CITRUCEL ONCE Daily to help bind up stools  Try eating peanut butter daily .   If the diarrhea  Persists,  Let me know      WE WILL REPEAT LABS ON DEC 6

## 2018-08-18 IMAGING — DX DG ABDOMEN 1V
2 series · 2 of 2 positions shown · non-contrast
Comparison: MRI 10/07/2016, 10/06/2016, CT 10/05/2016

CLINICAL DATA: Right upper quadrant pain

EXAM:
ABDOMEN - 1 VIEW

[abdomen kub (1 of 2)]
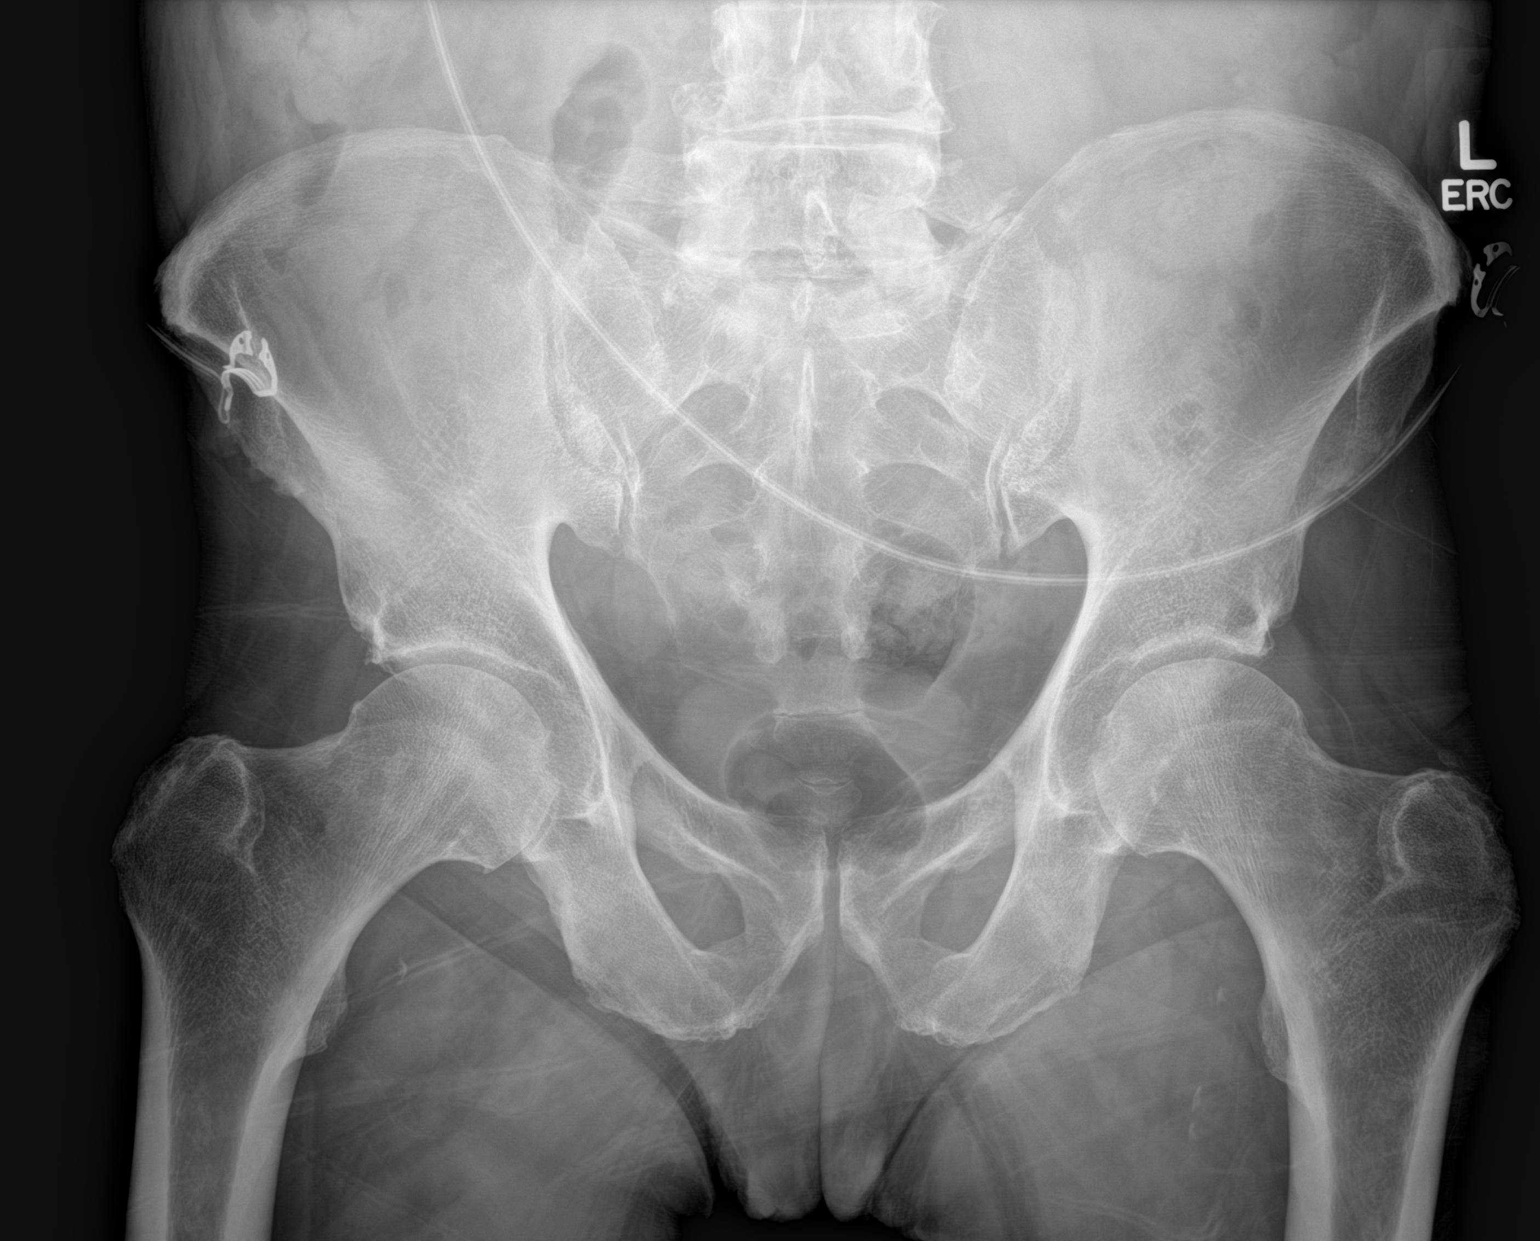

[abdomen kub (2 of 2)]
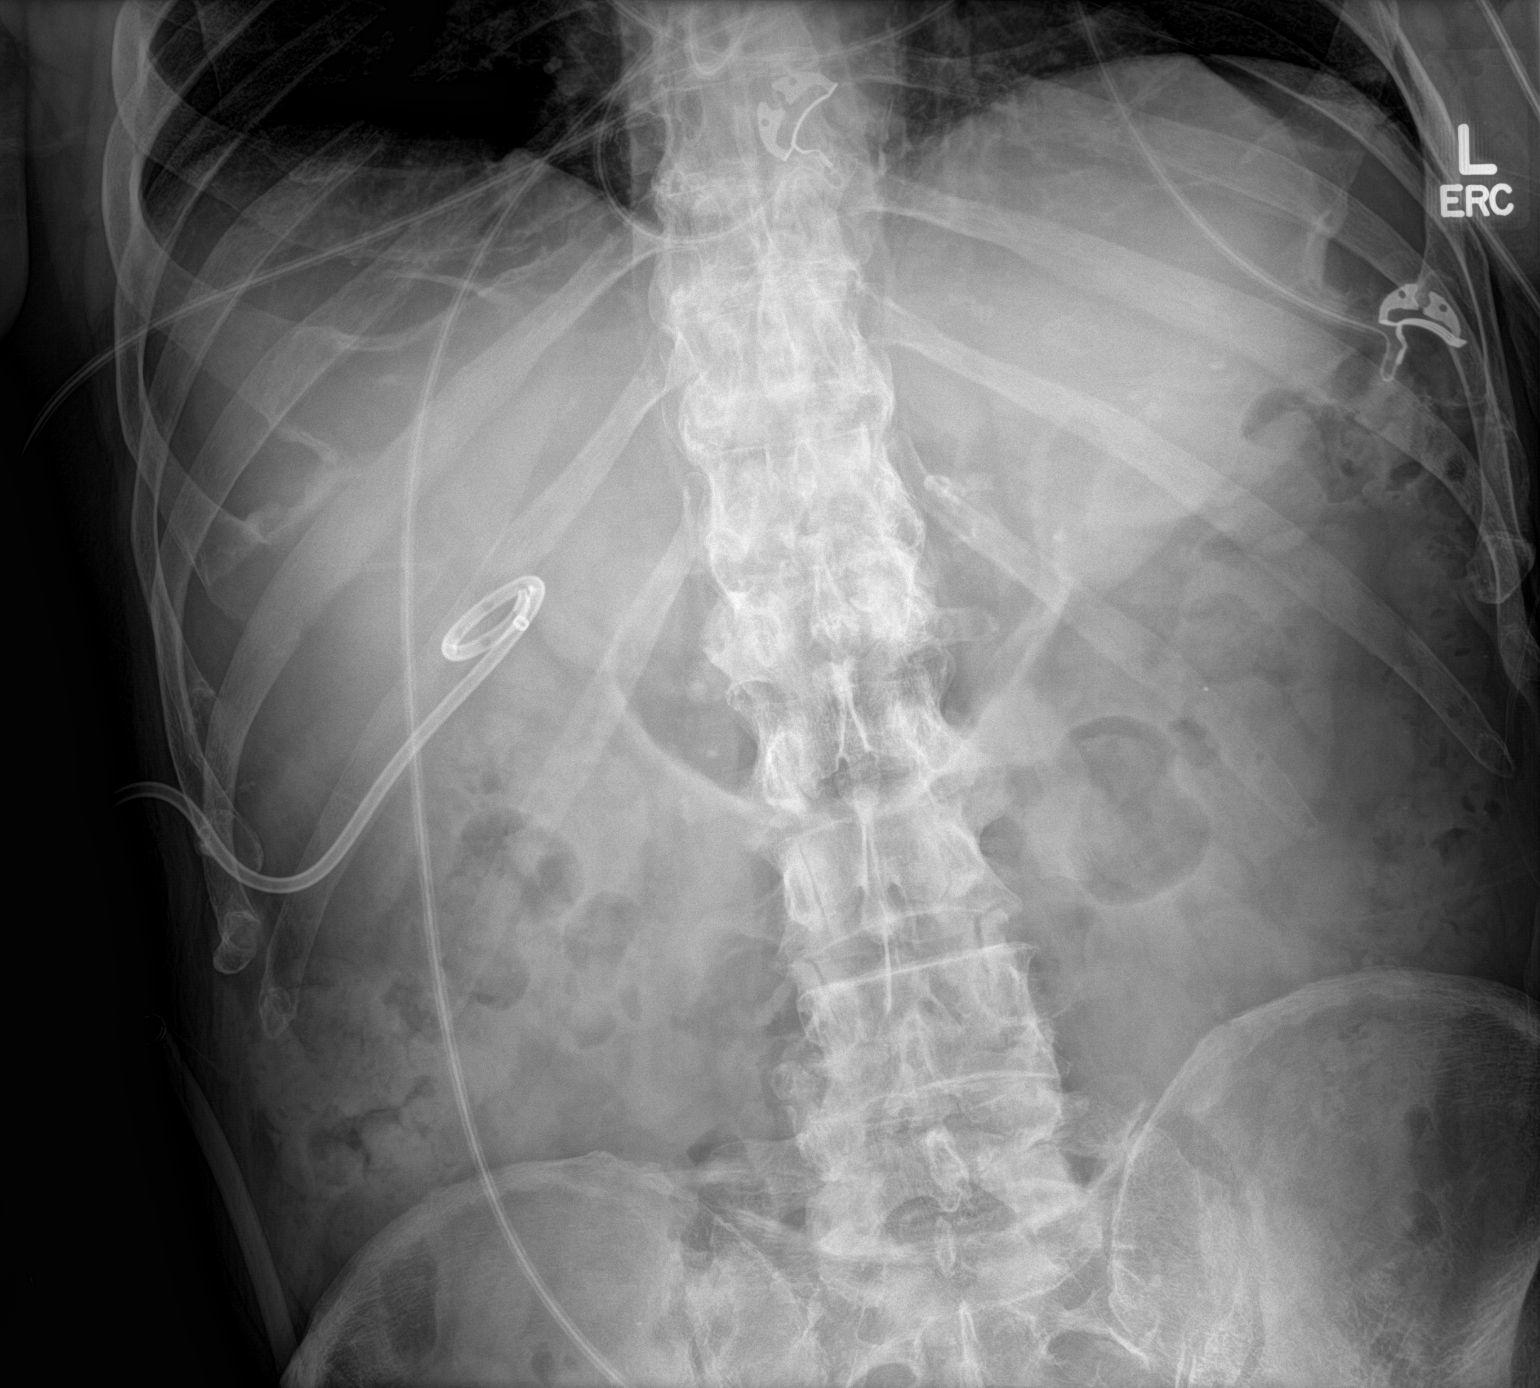

[2 of 2 positions shown; findings below may reference images not displayed]

FINDINGS: There is a nonobstructed bowel gas pattern. There is a drainage
catheter in the right upper quadrant of the abdomen. No pathologic
calcifications are visualized.
IMPRESSION: Nonobstructed bowel-gas pattern. Drainage catheter present in the
right upper quadrant.

## 2018-08-19 ENCOUNTER — Other Ambulatory Visit: Payer: Self-pay

## 2018-08-19 ENCOUNTER — Telehealth: Payer: Self-pay | Admitting: Internal Medicine

## 2018-08-19 MED ORDER — MELOXICAM 15 MG PO TABS: 15.0000 mg | ORAL_TABLET | Freq: Every day | ORAL | 1 refills | Status: DC

## 2018-08-19 NOTE — Telephone Encounter (Signed)
Copied from [place]. Topic: Quick Communication - See Telephone Encounter >> Aug 19, 2018  4:30 PM Rutherford Nail, [place] wrote: CRM for notification. See Telephone encounter for: 08/19/18. Patient calling to give an update on his diarrhea. States that Dr Derrel Nip had him stop having milk and yogurt for 1 week. States that it worked a little bit. States that it is not runny, but is still soft and loose. Would like to request that he be put back on 3 hydrocodone pills and his metformin. Please advise.  CB#: [phone]

## 2018-08-19 NOTE — Telephone Encounter (Signed)
Denied.  Metformin will aggravate diarrhea,  And vicodin is not an acceptable antidote for diarrhea. Return to dr Allen Norris if diarrhea recurs.

## 2018-08-20 NOTE — Telephone Encounter (Signed)
Note by Dr Derrel Nip on 08/19/18 read to patient. Pt verbalized understanding of the conversation.

## 2018-08-20 NOTE — Telephone Encounter (Signed)
LMTCB. PEC may speak with pt.  

## 2018-08-21 ENCOUNTER — Other Ambulatory Visit: Payer: Self-pay | Admitting: Internal Medicine

## 2018-08-21 ENCOUNTER — Telehealth: Payer: Self-pay | Admitting: Internal Medicine

## 2018-08-21 NOTE — Telephone Encounter (Signed)
Copied from [place] [phone]. Topic: Appointment Scheduling - Scheduling Inquiry for Clinic >> Aug 21, 2018  1:42 PM Sheran Luz wrote: Reason for CRM: Pt called to reschedule appointment on 12/6 as he has a conflicting dentist appointment that day. Pt states he will be out of medication on the 7th and would like to know if he could be worked in on the 4th,5th or 6th of December. Please advise.

## 2018-08-31 ENCOUNTER — Other Ambulatory Visit: Payer: Self-pay | Admitting: Internal Medicine

## 2018-09-04 ENCOUNTER — Telehealth: Payer: Self-pay | Admitting: Internal Medicine

## 2018-09-04 NOTE — Telephone Encounter (Signed)
Reviewed Dr Lupita Dawn last note.  Given persistence and given has tried multiple meds without relief, I feel he needs f/u with GI to discuss other treatment options.

## 2018-09-04 NOTE — Telephone Encounter (Signed)
Spoke with pt and informed him of what Dr. Nicki Reaper suggested. The pt stated that he went to GI and they told him to follow up with Dr. Derrel Nip. Pt was advised that Dr. Derrel Nip is out of the office until Monday. Pt stated that he would like to wait and see what Dr. Derrel Nip thinks on Monday. Pt was advised to make sure he keeps himself hydrated because diarrhea can cause him to become dehydrated. Pt gave a verbal understanding.

## 2018-09-04 NOTE — Telephone Encounter (Signed)
Copied from [place] [id number]. Topic: Quick Communication - See Telephone Encounter >> Sep 04, 2018 11:31 AM Conception Chancy, [place] wrote: CRM for notification. See Telephone encounter for: 09/04/18.  Patient is calling and states he had diarrhea 7 times yesterday and 4 times today. He would like Dr. Derrel Nip to call him in something for this. She states last time he was told to go to Dr. Verl Blalock but he states he does not need to see him.  [id number] [address] [place] [address] Phone: [phone] Fax: [phone]

## 2018-09-04 NOTE — Telephone Encounter (Signed)
I called patient & he stated that he has seen Dr. Derrel Nip as well as Dr. Verl Blalock for this multiple times. He said no medication OTC help his diarrhea. I asked if this was acute & he felt he needed to be seen. He didn't feel he did since this was a reoccurring issue.

## 2018-09-04 NOTE — Telephone Encounter (Signed)
He was advised at his appointment on oct 21 with me to follow dr Dorothey Baseman instructionss :  Stop meloxicam ( HE REFILLED IN ON OCT 30)  Stop all dairywo Stop metformin  If he has done all this (doubtful given meloxicam refill),  He should stop famotidine and pantoprazole for one week  .  If symptoms persist,  I will call in cholestyramine as a trial .  If this does not help I will ask Dr Allen Norris to see him again

## 2018-09-04 NOTE — Telephone Encounter (Signed)
Pt has been seen for this before.

## 2018-09-09 NOTE — Telephone Encounter (Signed)
LMTCB. Please transfer pt our office.

## 2018-09-10 NOTE — Telephone Encounter (Addendum)
Spoke with patient he states he took Nutmeg bid Sun, Monday. He has been having  solid bowel since. Hasn't taken Nutmeg since Sunday  due to worried about side effect he read of possible hallucinations  Still taking Meloxicam despite of Dr Lynnell Jude instructions he states its the only thing that helps joints Again advised of Dr Lynnell Jude  recommendations below. He states he will wait until appointment on December 2nd to discuss.

## 2018-09-22 ENCOUNTER — Other Ambulatory Visit: Payer: Self-pay | Admitting: Internal Medicine

## 2018-09-23 ENCOUNTER — Encounter: Payer: Self-pay | Admitting: Internal Medicine

## 2018-09-23 ENCOUNTER — Ambulatory Visit: Payer: Medicare Other | Admitting: Internal Medicine

## 2018-09-23 VITALS — BP 154/80 | HR 51 | Temp 97.6°F | Resp 15 | Ht 71.0 in | Wt 204.4 lb

## 2018-09-23 DIAGNOSIS — K529 Noninfective gastroenteritis and colitis, unspecified: Secondary | ICD-10-CM | POA: Diagnosis not present

## 2018-09-23 DIAGNOSIS — E1151 Type 2 diabetes mellitus with diabetic peripheral angiopathy without gangrene: Secondary | ICD-10-CM

## 2018-09-23 LAB — MICROALBUMIN / CREATININE URINE RATIO
Creatinine,U: 85.9 mg/dL
Microalb Creat Ratio: 10.6 mg/g (ref 0.0–30.0)
Microalb, Ur: 9.1 mg/dL — ABNORMAL HIGH (ref 0.0–1.9)

## 2018-09-23 LAB — CBC WITH DIFFERENTIAL/PLATELET
BASOS PCT: 0.7 % (ref 0.0–3.0)
Basophils Absolute: 0 10*3/uL (ref 0.0–0.1)
EOS ABS: 0.2 10*3/uL (ref 0.0–0.7)
Eosinophils Relative: 2.8 % (ref 0.0–5.0)
HEMATOCRIT: 43.5 % (ref 39.0–52.0)
HEMOGLOBIN: 14.6 g/dL (ref 13.0–17.0)
LYMPHS PCT: 36.9 % (ref 12.0–46.0)
Lymphs Abs: 2.3 10*3/uL (ref 0.7–4.0)
MCHC: 33.6 g/dL (ref 30.0–36.0)
MCV: 88.1 fl (ref 78.0–100.0)
MONO ABS: 0.6 10*3/uL (ref 0.1–1.0)
Monocytes Relative: 8.9 % (ref 3.0–12.0)
NEUTROS ABS: 3.2 10*3/uL (ref 1.4–7.7)
Neutrophils Relative %: 50.7 % (ref 43.0–77.0)
PLATELETS: 244 10*3/uL (ref 150.0–400.0)
RBC: 4.94 Mil/uL (ref 4.22–5.81)
RDW: 16.1 % — AB (ref 11.5–15.5)
WBC: 6.3 10*3/uL (ref 4.0–10.5)

## 2018-09-23 LAB — COMPREHENSIVE METABOLIC PANEL
ALT: 20 U/L (ref 0–53)
AST: 17 U/L (ref 0–37)
Albumin: 4.1 g/dL (ref 3.5–5.2)
Alkaline Phosphatase: 72 U/L (ref 39–117)
BILIRUBIN TOTAL: 0.8 mg/dL (ref 0.2–1.2)
BUN: 18 mg/dL (ref 6–23)
CALCIUM: 9.2 mg/dL (ref 8.4–10.5)
CO2: 31 mEq/L (ref 19–32)
Chloride: 102 mEq/L (ref 96–112)
Creatinine, Ser: 1.08 mg/dL (ref 0.40–1.50)
GFR: 69.33 mL/min (ref >60.00)
Glucose, Bld: 163 mg/dL — ABNORMAL HIGH (ref 70–99)
Potassium: 4.6 mEq/L (ref 3.5–5.1)
Sodium: 139 mEq/L (ref 135–145)
TOTAL PROTEIN: 6.8 g/dL (ref 6.0–8.3)

## 2018-09-23 LAB — HEMOGLOBIN A1C: Hgb A1c MFr Bld: 8.4 % — ABNORMAL HIGH (ref 4.6–6.5)

## 2018-09-23 LAB — C-REACTIVE PROTEIN: CRP: 0.1 mg/dL — ABNORMAL LOW (ref 0.5–20.0)

## 2018-09-23 LAB — MAGNESIUM: Magnesium: 2.1 mg/dL (ref 1.5–2.5)

## 2018-09-23 MED ORDER — HYDROCODONE-ACETAMINOPHEN 5-325 MG PO TABS: 1.0000 | ORAL_TABLET | Freq: Two times a day (BID) | ORAL | 0 refills | Status: DC | PRN

## 2018-09-23 NOTE — Patient Instructions (Addendum)
You need to make an appointment with  Dr Allen Norris again to follow up on  Your diarrhea    I want you to try taking Immodium 3 times daily for one week to see if the diarrhea resolves  If Immodium does not help,  I will prescribe the Paregoric (safer than Lomotil)

## 2018-09-23 NOTE — Progress Notes (Signed)
Subjective:  Patient ID: Henry Lindsey, male    DOB: 27-May-1933  Age: 84 y.o. MRN: [id number]  CC: The primary encounter diagnosis was Colitis. Diagnoses of Type 2 diabetes mellitus with diabetic peripheral angiopathy without gangrene, without long-term current use of insulin (HCC) and Chronic diarrhea were also pertinent to this visit.  HPI Henry Lindsey presents for 3 month follow up on chronic diarrhea,  Chronic pain  and type 2 diabetes.  Patient has no new complaints today.  Patient is not following a low glycemic index diet and states that he is taking all prescribed medications regularly without side effects.  Fasting sugars have been under less than 180 most of the time and post prandials have been under 200 except on rare occasions. Patient is walking about 3 times per week and  Not intentionally trying to lose weight .  Patient has had an eye exam in the last 12 months and checks feet regularly for signs of infection.  Patient does not walk barefoot outside,  And denies an numbness tingling or burning in feet. Patient is up to date on all recommended vaccinations  Lab Results  Component Value Date   MICROALBUR 9.1 (H) 09/23/2018    Diarrhea:  He has been multiple loose stools daily since Having 2 to 4 loose, runny  stools daily (improved from 7 to 8 )   Suspension of metformin. Dairy and motrin did not relieve the diarrhea .  Symptoms were .temporarily better with nutmeg but only for a week.  He is requesting an increase in his Vicodin use since this has helped,  And is also requesting Lomotil. [name]  He has not tried Omodium   Lab Results  Component Value Date   HGBA1C 8.4 (H) 09/23/2018     He stopped meloxicam metformin and all dairy  For one week and diarrhea continued.  He has resumed drinking 1/2 glass of milk daily ,  Has not tried lactaid.     Stools are "black "in color,  Colonoscopy was done in April and active colitis was noted.    Lab Results  Component Value Date   HGBA1C 8.4 (H) 09/23/2018       Outpatient Medications Prior to Visit  Medication Sig Dispense Refill  . albuterol (PROVENTIL HFA;VENTOLIN HFA) 108 (90 Base) MCG/ACT inhaler Inhale 1-2 puffs into the lungs every 6 (six) hours as needed for wheezing or shortness of breath (cough). 1 Inhaler 0  . Alum & Mag Hydroxide-Simeth (MAGIC MOUTHWASH) SOLN Take 5 mLs by mouth 3 (three) times daily as needed for mouth pain.    [name] aspirin 81 MG tablet Take 1 tablet (81 mg total) by mouth daily.    [name] atorvastatin (LIPITOR) 40 MG tablet TAKE 1 TABLET BY MOUTH EVERY DAY 90 tablet 0  . Blood Glucose Monitoring Suppl (ONE TOUCH ULTRA SYSTEM KIT) w/Device KIT 1 kit by Does not apply route once. Use DX code E11.59 1 each 0  . Cholecalciferol (VITAMIN D3) 2000 units TABS Take 1 tablet by mouth daily.    . cilostazol (PLETAL) 100 MG tablet TAKE 1 TABLET(100 MG) BY MOUTH TWICE DAILY 180 tablet 0  . famotidine (PEPCID) 20 MG tablet TAKE 1 TABLET(20 MG) BY MOUTH TWICE DAILY 180 tablet 0  . fenofibrate micronized (LOFIBRA) 134 MG capsule TAKE 1 CAPSULE BY MOUTH EVERY MORNING BEFORE BREAKFAST 30 capsule 0  . gabapentin (NEURONTIN) 400 MG capsule TAKE 1 CAPSULE(400 MG) BY MOUTH THREE TIMES DAILY 270 capsule 0  .  glipiZIDE (GLUCOTROL) 10 MG tablet TAKE 1 TABLET(10 MG) BY MOUTH TWICE DAILY BEFORE A MEAL 180 tablet 0  . HYDROcodone-acetaminophen (NORCO/VICODIN) 5-325 MG tablet Take 1 tablet by mouth 2 (two) times daily as needed for moderate pain (KNEE PAIN). 60 tablet 0  . HYDROcodone-acetaminophen (NORCO/VICODIN) 5-325 MG tablet Take 1 tablet by mouth 2 (two) times daily as needed for moderate pain (KNEE PAIN). 60 tablet 0  . INS SYRINGE/NEEDLE 1CC/28G (B-D INSULIN SYRINGE 1CC/28G) 28G X 1/2" 1 ML MISC USE TO ADMINISTER INSULIN DAILY 100 each 0  . insulin lispro protamine-lispro (HUMALOG MIX 75/25) (75-25) 100 UNIT/ML SUSP injection Inject 22 units right before  breakfast and  27 units before dinner 10 mL 5  . insulin  lispro protamine-lispro (HUMALOG MIX 75/25) (75-25) 100 UNIT/ML SUSP injection INJECT 20 UNITS UNDER THE SKIN RIGHT BEFORE BREAKFAST AND 25 UNITS UNDER THE SKIN BEFORE DINNER 10 mL 2  . latanoprost (XALATAN) 0.005 % ophthalmic solution INSTILL 1 DROP INTO BOTH EYES QD  5  . linagliptin (TRADJENTA) 5 MG TABS tablet Take 1 tablet (5 mg total) by mouth daily. 90 tablet 1  . metoprolol succinate (TOPROL-XL) 100 MG 24 hr tablet TAKE 1 TABLET BY MOUTH EVERY DAY WITH OR IMMEDIATELY FOLLOWING A MEAL 90 tablet 0  . Multiple Vitamin (MULTIVITAMIN WITH MINERALS) TABS tablet Take 1 tablet by mouth daily.    . nitroGLYCERIN (NITROSTAT) 0.4 MG SL tablet Place 1 tablet (0.4 mg total) under the tongue every 5 (five) minutes as needed for chest pain. MAXIMUM 3 TABLETS 50 tablet 3  . ONE TOUCH ULTRA TEST test strip USE 1 STRIP TO TEST THREE TIMES DAILY AS DIRECTED 100 each 0  . ONETOUCH DELICA LANCETS 98P MISC Use three times daily to check blood sugar. 100 each 11  . pantoprazole (PROTONIX) 40 MG tablet TAKE 1 TABLET(40 MG) BY MOUTH TWICE DAILY BEFORE A MEAL 180 tablet 0  . tamsulosin (FLOMAX) 0.4 MG CAPS capsule TAKE 1 CAPSULE BY MOUTH EVERY DAY 90 capsule 1  . tamsulosin (FLOMAX) 0.4 MG CAPS capsule TAKE 1 CAPSULE(0.4 MG) BY MOUTH DAILY 90 capsule 0  . TANDEM 162-115.2 MG CAPS capsule TAKE 1 CAPSULE BY MOUTH EVERY DAY**TAKE WITH ORANGE JUICE OR VITAMIN CAPSULE** 30 capsule 0  . timolol (TIMOPTIC) 0.5 % ophthalmic solution INT 1 GTT IN OU QD  2  . Diphenhyd-Hydrocort-Nystatin (FIRST-DUKES MOUTHWASH) SUSP Swish 5 cc in mouth as needed or ulcers  And spit out 237 mL 1  . HYDROcodone-acetaminophen (NORCO/VICODIN) 5-325 MG tablet Take 1 tablet by mouth 2 (two) times daily as needed for moderate pain (KNEE PAIN). 60 tablet 0   No facility-administered medications prior to visit.     Review of Systems;  Patient denies headache, fevers, malaise, unintentional weight loss, skin rash, eye pain, sinus congestion and  sinus pain, sore throat, dysphagia,  hemoptysis , cough, dyspnea, wheezing, chest pain, palpitations, orthopnea, edema, abdominal pain, nausea, melena, diarrhea, constipation, flank pain, dysuria, hematuria, urinary  Frequency, nocturia, numbness, tingling, seizures,  Focal weakness, Loss of consciousness,  Tremor, insomnia, depression, anxiety, and suicidal ideation.      Objective:  BP (!) 154/80 (BP Location: Left Arm, Patient Position: Sitting, Cuff Size: Normal)   Pulse (!) 51   Temp 97.6 F (36.4 C) (Oral)   Resp 15   Ht _0  (1.803 m)   Wt 204 lb 6.4 oz (92.7 kg)   SpO2 92%   BMI 28.51 kg/m   BP Readings from Last 3  Encounters:  09/23/18 (!) 154/80  08/10/18 124/62  05/01/18 (!) 150/80    Wt Readings from Last 3 Encounters:  09/23/18 204 lb 6.4 oz (92.7 kg)  08/10/18 195 lb 3.2 oz (88.5 kg)  05/01/18 201 lb (91.2 kg)    General appearance: alert, cooperative and appears stated age Ears: normal TM's and external ear canals both ears Throat: lips, mucosa, and tongue normal; teeth and gums normal Neck: no adenopathy, no carotid bruit, supple, symmetrical, trachea midline and thyroid not enlarged, symmetric, no tenderness/mass/nodules Back: symmetric, no curvature. ROM normal. No CVA tenderness. Lungs: clear to auscultation bilaterally Heart: regular rate and rhythm, S1, S2 normal, no murmur, click, rub or gallop Abdomen: soft, non-tender; bowel sounds normal; no masses,  no organomegaly Pulses: 2+ and symmetric Skin: Skin color, texture, turgor normal. No rashes or lesions Lymph nodes: Cervical, supraclavicular, and axillary nodes normal.  Lab Results  Component Value Date   HGBA1C 8.4 (H) 09/23/2018   HGBA1C 7.7 (H) 06/11/2018   HGBA1C 7.8 (H) 03/09/2018    Lab Results  Component Value Date   CREATININE 1.08 09/23/2018   CREATININE 1.17 06/11/2018   CREATININE 1.15 03/09/2018    Lab Results  Component Value Date   WBC 6.3 09/23/2018   HGB 14.6  09/23/2018   HCT 43.5 09/23/2018   PLT 244.0 09/23/2018   GLUCOSE 163 (H) 09/23/2018   CHOL 94 06/11/2018   TRIG 78.0 06/11/2018   HDL 40.80 06/11/2018   LDLDIRECT 62.0 10/04/2016   LDLCALC 38 06/11/2018   ALT 20 09/23/2018   AST 17 09/23/2018   NA 139 09/23/2018   K 4.6 09/23/2018   CL 102 09/23/2018   CREATININE 1.08 09/23/2018   BUN 18 09/23/2018   CO2 31 09/23/2018   TSH 2.943 10/20/2016   PSA 0.83 10/10/2014   INR 1.22 11/21/2016   HGBA1C 8.4 (H) 09/23/2018   MICROALBUR 9.1 (H) 09/23/2018    No results found.  Assessment & Plan:   Problem List Items Addressed This Visit    Chronic diarrhea    Occurring daily since his cholecystectomy.  No unintentional weight loss.  He has no evidence of infection despite his colonoscopy biopsies showing active colitis.  His symptoms did not improve with suspension of metformin , dairy and motrin. Lomotil requested but denied due to risk of use I the aged.  Urged to try using Imodium fist and follow up with Dr Allen Norris       T2DM (type 2 diabetes mellitus) ([place])    Uncontrolled due to dietary nonadherence and challenges .  He has been asked to return with a log of post prnadial CBG  Lab Results  Component Value Date   HGBA1C 8.4 (H) 09/23/2018         Relevant Orders   Hemoglobin A1c (Completed)   Microalbumin / creatinine urine ratio (Completed)   Amb Referral to Clinical Pharmacist    Other Visit Diagnoses    Colitis    -  Primary   Relevant Orders   C-reactive protein (Completed)   Comprehensive metabolic panel (Completed)   Magnesium (Completed)   CBC with Differential/Platelet (Completed)     A total of 25 minutes of face to face time was spent with patient more than half of which was spent in counselling about the above mentioned conditions  and coordination of care  I am having Tryce P. Ferrufino maintain his magic mouthwash, ONE TOUCH ULTRA SYSTEM KIT, ONETOUCH DELICA LANCETS 16X, latanoprost, INS SYRINGE/NEEDLE 1CC/28G,  fenofibrate micronized, nitroGLYCERIN, multivitamin with minerals, aspirin, Vitamin D3, tamsulosin, insulin lispro protamine-lispro, linagliptin, albuterol, timolol, HYDROcodone-acetaminophen, HYDROcodone-acetaminophen, insulin lispro protamine-lispro, atorvastatin, pantoprazole, glipiZIDE, cilostazol, gabapentin, tamsulosin, ONE TOUCH ULTRA TEST, metoprolol succinate, famotidine, TANDEM, and HYDROcodone-acetaminophen.  Meds ordered this encounter  Medications  . HYDROcodone-acetaminophen (NORCO/VICODIN) 5-325 MG tablet    Sig: Take 1 tablet by mouth 2 (two) times daily as needed for moderate pain (KNEE PAIN).    Dispense:  60 tablet    Refill:  0    May refill on or after  December 8 ,  2019    Medications Discontinued During This Encounter  Medication Reason  . HYDROcodone-acetaminophen (NORCO/VICODIN) 5-325 MG tablet Reorder    Follow-up: Return in about 3 months (around 12/23/2018) for follow up diabetes, med refill .   [name], MD

## 2018-09-24 ENCOUNTER — Ambulatory Visit: Payer: Self-pay

## 2018-09-24 NOTE — Telephone Encounter (Signed)
Please advise 

## 2018-09-24 NOTE — Telephone Encounter (Signed)
He is not going to receive Lomotil,  As I explained to him yesterday IN WRITING ON HIS AVS. He has to try Imodium first  AND HE DID NOT ASK FOR DUKE'S MOUTHWASH.  Why does he need it?

## 2018-09-24 NOTE — Telephone Encounter (Signed)
  Pt called stating that neither the Dukes Mouthwash or the lomotil was called in to his pharmacy. Pt is asking that the bigger bottle of the Duke's mouthwash be called in. Pt uses [id number] [address], [place] [facility] [phone] (Phone) [phone] (Fax) Reason for Disposition . [1] Prescription not at pharmacy AND [2] was prescribed today by PCP    Pt stated it was prescribed yesterday  Answer Assessment - Initial Assessment Questions 1. SYMPTOMS: "Do you have any symptoms?"     n/a 2. SEVERITY: If symptoms are present, ask "Are they mild, moderate or severe?" n/a Pt called stating that neither the Dukes Mouthwash or the lomotil was called in to his pharmacy. Pt is asking that the bigger bottle of the Duke's mouthwash be called in.  Protocols used: MEDICATION QUESTION CALL-A-AH

## 2018-09-25 ENCOUNTER — Ambulatory Visit: Payer: Medicare Other | Admitting: Internal Medicine

## 2018-09-25 MED ORDER — FIRST-DUKES MOUTHWASH MT SUSP: OROMUCOSAL | 1 refills | Status: DC

## 2018-09-25 NOTE — Telephone Encounter (Signed)
Patient called into [facility] and told the agent he will take Immodium. I called the patient to ask about the Dukes and Imodium. He says that he will take the Imodium. He says he showed Dr. Derrel Nip the bottle in the office, the one she prescribed last. He says that he uses it as needed when his mouth starts feeling sore. He says right now, he's ok, but yesterday he felt his bottom lip was swelling a little. He applied chap stick, but says it still felt sore on the inside, but no ulcer. He says he would like a larger bottle than the small one he has.

## 2018-09-25 NOTE — Addendum Note (Signed)
Addended by: [name] on: 09/25/2018 02:14 PM   Modules accepted: Orders

## 2018-09-25 NOTE — Telephone Encounter (Signed)
MED REFILLED.   SIZE OF BOTTLE DOUBLED

## 2018-09-25 NOTE — Telephone Encounter (Signed)
FYI

## 2018-09-26 NOTE — Assessment & Plan Note (Signed)
Uncontrolled due to dietary nonadherence and challenges .  He has been asked to return with a log of post prnadial CBG  Lab Results  Component Value Date   HGBA1C 8.4 (H) 09/23/2018

## 2018-09-26 NOTE — Assessment & Plan Note (Addendum)
Occurring daily since his cholecystectomy.  No unintentional weight loss.  He has no evidence of infection despite his colonoscopy biopsies showing active colitis.  His symptoms did not improve with suspension of metformin , dairy and motrin. Lomotil requested but denied due to risk of use I the aged.  Urged to try using Imodium fist and follow up with Dr Allen Norris

## 2018-09-28 ENCOUNTER — Other Ambulatory Visit (INDEPENDENT_AMBULATORY_CARE_PROVIDER_SITE_OTHER): Payer: Self-pay | Admitting: Vascular Surgery

## 2018-09-28 DIAGNOSIS — I739 Peripheral vascular disease, unspecified: Secondary | ICD-10-CM

## 2018-09-28 DIAGNOSIS — I779 Disorder of arteries and arterioles, unspecified: Secondary | ICD-10-CM

## 2018-09-29 ENCOUNTER — Ambulatory Visit (INDEPENDENT_AMBULATORY_CARE_PROVIDER_SITE_OTHER): Payer: Medicare Other | Admitting: Vascular Surgery

## 2018-09-29 ENCOUNTER — Encounter

## 2018-09-29 ENCOUNTER — Encounter (INDEPENDENT_AMBULATORY_CARE_PROVIDER_SITE_OTHER): Payer: Self-pay | Admitting: Vascular Surgery

## 2018-09-29 ENCOUNTER — Other Ambulatory Visit (INDEPENDENT_AMBULATORY_CARE_PROVIDER_SITE_OTHER): Payer: Self-pay | Admitting: Vascular Surgery

## 2018-09-29 ENCOUNTER — Ambulatory Visit (INDEPENDENT_AMBULATORY_CARE_PROVIDER_SITE_OTHER): Payer: Medicare Other

## 2018-09-29 VITALS — BP 149/76 | HR 58 | Ht 71.0 in | Wt 202.8 lb

## 2018-09-29 DIAGNOSIS — I6523 Occlusion and stenosis of bilateral carotid arteries: Secondary | ICD-10-CM | POA: Diagnosis not present

## 2018-09-29 DIAGNOSIS — I63239 Cerebral infarction due to unspecified occlusion or stenosis of unspecified carotid arteries: Secondary | ICD-10-CM

## 2018-09-29 DIAGNOSIS — E782 Mixed hyperlipidemia: Secondary | ICD-10-CM | POA: Diagnosis not present

## 2018-09-29 DIAGNOSIS — I739 Peripheral vascular disease, unspecified: Secondary | ICD-10-CM

## 2018-09-29 DIAGNOSIS — E1151 Type 2 diabetes mellitus with diabetic peripheral angiopathy without gangrene: Secondary | ICD-10-CM | POA: Diagnosis not present

## 2018-09-29 DIAGNOSIS — Z87891 Personal history of nicotine dependence: Secondary | ICD-10-CM | POA: Diagnosis not present

## 2018-09-29 NOTE — Progress Notes (Signed)
MRN : [id number]  Henry Lindsey is a 84 y.o. (Jul 03, 1933) male who presents with chief complaint of  Chief Complaint  Patient presents with  . Follow-up    1 YR  .  History of Present Illness: Patient returns today in follow up of multiple vascular issues.  He is doing well without any complaints today.  No lifestyle limiting claudication, ischemic rest pain, or ulceration.  His ABIs today are mildly reduced on the right at 0.82 and normal on the left at 1.0. He is also studied with carotid duplex today.  He has had no recent focal neurologic symptoms.  Carotid duplex today reveals 1 to 39% right ICA stenosis  Current Outpatient Medications  Medication Sig Dispense Refill  . albuterol (PROVENTIL HFA;VENTOLIN HFA) 108 (90 Base) MCG/ACT inhaler Inhale 1-2 puffs into the lungs every 6 (six) hours as needed for wheezing or shortness of breath (cough). 1 Inhaler 0  . Alum & Mag Hydroxide-Simeth (MAGIC MOUTHWASH) SOLN Take 5 mLs by mouth 3 (three) times daily as needed for mouth pain.    [name] aspirin EC 325 MG tablet Take 325 mg by mouth daily.    [name] atorvastatin (LIPITOR) 40 MG tablet TAKE 1 TABLET BY MOUTH EVERY DAY 90 tablet 0  . Blood Glucose Monitoring Suppl (ONE TOUCH ULTRA SYSTEM KIT) w/Device KIT 1 kit by Does not apply route once. Use DX code E11.59 1 each 0  . Cholecalciferol (VITAMIN D3) 2000 units TABS Take 1 tablet by mouth daily.    . cilostazol (PLETAL) 100 MG tablet TAKE 1 TABLET(100 MG) BY MOUTH TWICE DAILY 180 tablet 0  . Diphenhyd-Hydrocort-Nystatin (FIRST-DUKES MOUTHWASH) SUSP Swish 5 cc in mouth as needed or ulcers  And spit out 500 mL 1  . famotidine (PEPCID) 20 MG tablet TAKE 1 TABLET(20 MG) BY MOUTH TWICE DAILY 180 tablet 0  . fenofibrate micronized (LOFIBRA) 134 MG capsule TAKE 1 CAPSULE BY MOUTH EVERY MORNING BEFORE BREAKFAST 30 capsule 0  . gabapentin (NEURONTIN) 400 MG capsule TAKE 1 CAPSULE(400 MG) BY MOUTH THREE TIMES DAILY 270 capsule 0  . glipiZIDE (GLUCOTROL) 10 MG  tablet TAKE 1 TABLET(10 MG) BY MOUTH TWICE DAILY BEFORE A MEAL 180 tablet 0  . HYDROcodone-acetaminophen (NORCO/VICODIN) 5-325 MG tablet Take 1 tablet by mouth 2 (two) times daily as needed for moderate pain (KNEE PAIN). 60 tablet 0  . INS SYRINGE/NEEDLE 1CC/28G (B-D INSULIN SYRINGE 1CC/28G) 28G X 1/2" 1 ML MISC USE TO ADMINISTER INSULIN DAILY 100 each 0  . insulin lispro protamine-lispro (HUMALOG MIX 75/25) (75-25) 100 UNIT/ML SUSP injection Inject 22 units right before  breakfast and  27 units before dinner 10 mL 5  . insulin lispro protamine-lispro (HUMALOG MIX 75/25) (75-25) 100 UNIT/ML SUSP injection INJECT 20 UNITS UNDER THE SKIN RIGHT BEFORE BREAKFAST AND 25 UNITS UNDER THE SKIN BEFORE DINNER 10 mL 2  . latanoprost (XALATAN) 0.005 % ophthalmic solution INSTILL 1 DROP INTO BOTH EYES QD  5  . linagliptin (TRADJENTA) 5 MG TABS tablet Take 1 tablet (5 mg total) by mouth daily. 90 tablet 1  . metoprolol succinate (TOPROL-XL) 100 MG 24 hr tablet TAKE 1 TABLET BY MOUTH EVERY DAY WITH OR IMMEDIATELY FOLLOWING A MEAL 90 tablet 0  . Multiple Vitamin (MULTIVITAMIN WITH MINERALS) TABS tablet Take 1 tablet by mouth daily.    . nitroGLYCERIN (NITROSTAT) 0.4 MG SL tablet Place 1 tablet (0.4 mg total) under the tongue every 5 (five) minutes as needed for chest pain. MAXIMUM 3  TABLETS 50 tablet 3  . ONE TOUCH ULTRA TEST test strip USE 1 STRIP TO TEST THREE TIMES DAILY AS DIRECTED 100 each 0  . ONETOUCH DELICA LANCETS 16X MISC Use three times daily to check blood sugar. 100 each 11  . pantoprazole (PROTONIX) 40 MG tablet TAKE 1 TABLET(40 MG) BY MOUTH TWICE DAILY BEFORE A MEAL 180 tablet 0  . tamsulosin (FLOMAX) 0.4 MG CAPS capsule TAKE 1 CAPSULE BY MOUTH EVERY DAY 90 capsule 1  . TANDEM 162-115.2 MG CAPS capsule TAKE 1 CAPSULE BY MOUTH EVERY DAY**TAKE WITH ORANGE JUICE OR VITAMIN CAPSULE** 30 capsule 0  . timolol (TIMOPTIC) 0.5 % ophthalmic solution INT 1 GTT IN OU QD  2   No current facility-administered  medications for this visit.     Past Medical History:  Diagnosis Date  . BPH (benign prostatic hyperplasia)   . Carotid artery stenosis 11/2010   <50% bilaterally  . COPD (chronic obstructive pulmonary disease) ([place])    noted CT 10/05/16 former smoker quit age 72   . CVA (cerebral infarction) 11/2010   r thalamic lacunar  . Diabetes mellitus   . GERD (gastroesophageal reflux disease)   . Hyperlipidemia   . Hypertension   . Neuromuscular disorder ([place])   . Peripheral vascular disease in diabetes mellitus ([place]) 06/2012    95% occlusion s/p PTCA R SFA Dew Sept 2013  . Stroke [facility])     Past Surgical History:  Procedure Laterality Date  . CHOLECYSTECTOMY N/A 11/13/2016   Procedure: LAPAROSCOPIC CHOLECYSTECTOMY WITH INTRAOPERATIVE CHOLANGIOGRAM;  Surgeon: Olean Ree, MD;  Location: [facility] ORS;  Service: General;  Laterality: N/A;  . COLONOSCOPY WITH PROPOFOL N/A 02/10/2018   Procedure: COLONOSCOPY WITH PROPOFOL;  Surgeon: Lucilla Lame, MD;  Location: [facility] ENDOSCOPY;  Service: Endoscopy;  Laterality: N/A;  . ERCP N/A 11/17/2016   Procedure: ENDOSCOPIC RETROGRADE CHOLANGIOPANCREATOGRAPHY (ERCP);  Surgeon: Irene Shipper, MD;  Location: [facility] ENDOSCOPY;  Service: Endoscopy;  Laterality: N/A;  . ERCP N/A 02/04/2017   Procedure: ENDOSCOPIC RETROGRADE CHOLANGIOPANCREATOGRAPHY (ERCP) Stent removal;  Surgeon: Lucilla Lame, MD;  Location: [facility] ENDOSCOPY;  Service: Endoscopy;  Laterality: N/A;  . IR GENERIC HISTORICAL  10/06/2016   IR PERC CHOLECYSTOSTOMY 10/06/2016 Aletta Edouard, MD [facility]-INTERV RAD  . JOINT REPLACEMENT Left 2014   left knee  . UPPER GASTROINTESTINAL ENDOSCOPY    . WRIST SURGERY      Social History Social History   Tobacco Use  . Smoking status: Former Smoker    Packs/day: 0.50    Years: 33.00    Pack years: 16.50    Types: Cigarettes    Start date: June 02, 1933    Last attempt to quit: 04/21/1968    Years since quitting: 50.4  . Smokeless tobacco: Never Used  Substance Use  Topics  . Alcohol use: No  . Drug use: No    Family History Family History  Problem Relation Age of Onset  . Diabetes Mother     Allergies  Allergen Reactions  . Metformin And Related Diarrhea     REVIEW OF SYSTEMS (Negative unless checked)  Constitutional: '[]'$ Weight loss  '[]'$ Fever  '[]'$ Chills Cardiac: '[]'$ Chest pain   '[]'$ Chest pressure   '[]'$ Palpitations   '[]'$ Shortness of breath when laying flat   '[]'$ Shortness of breath at rest   '[]'$ Shortness of breath with exertion. Vascular:  '[x]'$ Pain in legs with walking   '[]'$ Pain in legs at rest   '[]'$ Pain in legs when laying flat   '[]'$ Claudication   '[]'$ Pain in feet when walking  '[]'$   Pain in feet at rest  '[]'$ Pain in feet when laying flat   '[]'$ History of DVT   '[]'$ Phlebitis   '[]'$ Swelling in legs   '[]'$ Varicose veins   '[]'$ Non-healing ulcers Pulmonary:   '[]'$ Uses home oxygen   '[]'$ Productive cough   '[]'$ Hemoptysis   '[]'$ Wheeze  '[]'$ COPD   '[]'$ Asthma Neurologic:  '[]'$ Dizziness  '[]'$ Blackouts   '[]'$ Seizures   '[]'$ History of stroke   '[]'$ History of TIA  '[]'$ Aphasia   '[]'$ Temporary blindness   '[]'$ Dysphagia   '[]'$ Weakness or numbness in arms   '[]'$ Weakness or numbness in legs Musculoskeletal:  '[x]'$ Arthritis   '[]'$ Joint swelling   '[x]'$ Joint pain   '[]'$ Low back pain Hematologic:  '[]'$ Easy bruising  '[]'$ Easy bleeding   '[]'$ Hypercoagulable state   '[]'$ Anemic   Gastrointestinal:  '[]'$ Blood in stool   '[]'$ Vomiting blood  '[]'$ Gastroesophageal reflux/heartburn   '[]'$ Abdominal pain Genitourinary:  '[]'$ Chronic kidney disease   '[]'$ Difficult urination  '[]'$ Frequent urination  '[]'$ Burning with urination   '[]'$ Hematuria Skin:  '[]'$ Rashes   '[]'$ Ulcers   '[]'$ Wounds Psychological:  '[]'$ History of anxiety   '[]'$  History of major depression.  Physical Examination  BP (!) 149/76 (BP Location: Right Arm, Patient Position: Sitting)   Pulse (!) 58   Ht '5\' 11"'$  (1.803 m)   Wt 202 lb 12.8 oz (92 kg)   SpO2 (!) 18%   BMI 28.28 kg/m  Gen:  WD/WN, NAD Head: [place]/AT, No temporalis wasting. Ear/Nose/Throat: Hearing grossly intact, nares w/o erythema or drainage Eyes:  Conjunctiva clear. Sclera non-icteric Neck: Supple.  Trachea midline. No bruits Pulmonary:  Good air movement, no use of accessory muscles.  Cardiac: RRR, no JVD Vascular:  Vessel Right Left  Radial Palpable Palpable                          PT 1+ Palpable 1+ Palpable  DP 1+ Palpable Palpable    Musculoskeletal: M/S 5/5 throughout.  No deformity or atrophy. Trace LE edema. Neurologic: Sensation grossly intact in extremities.  Symmetrical.  Speech is fluent.  Psychiatric: Judgment intact, Mood & affect appropriate for pt's clinical situation. Dermatologic: No rashes or ulcers noted.  No cellulitis or open wounds.       Labs Recent Results (from the past 2160 hour(s))  HM DIABETES EYE EXAM     Status: None   Collection Time: 07/20/18 12:00 AM  Result Value Ref Range   HM Diabetic Eye Exam No Retinopathy No Retinopathy  Hemoglobin A1c     Status: Abnormal   Collection Time: 09/23/18 11:59 AM  Result Value Ref Range   Hgb A1c MFr Bld 8.4 (H) 4.6 - 6.5 %    Comment: Glycemic Control Guidelines for People with Diabetes:Non Diabetic:  <6%Goal of Therapy: <7%Additional Action Suggested:  >8%   C-reactive protein     Status: Abnormal   Collection Time: 09/23/18 11:59 AM  Result Value Ref Range   CRP 0.1 (L) 0.5 - 20.0 mg/dL  Comprehensive metabolic panel     Status: Abnormal   Collection Time: 09/23/18 11:59 AM  Result Value Ref Range   Sodium 139 135 - 145 mEq/L   Potassium 4.6 3.5 - 5.1 mEq/L   Chloride 102 96 - 112 mEq/L   CO2 31 19 - 32 mEq/L   Glucose, Bld 163 (H) 70 - 99 mg/dL   BUN 18 6 - 23 mg/dL   Creatinine, Ser 1.08 0.40 - 1.50 mg/dL   Total Bilirubin 0.8 0.2 - 1.2 mg/dL   Alkaline Phosphatase 72 39 - 117  U/L   AST 17 0 - 37 U/L   ALT 20 0 - 53 U/L   Total Protein 6.8 6.0 - 8.3 g/dL   Albumin 4.1 3.5 - 5.2 g/dL   Calcium 9.2 8.4 - 10.5 mg/dL   GFR 69.33 >60.00 mL/min  Magnesium     Status: None   Collection Time: 09/23/18 11:59 AM  Result Value Ref  Range   Magnesium 2.1 1.5 - 2.5 mg/dL  CBC with Differential/Platelet     Status: Abnormal   Collection Time: 09/23/18 11:59 AM  Result Value Ref Range   WBC 6.3 4.0 - 10.5 K/uL   RBC 4.94 4.22 - 5.81 Mil/uL   Hemoglobin 14.6 13.0 - 17.0 g/dL   HCT 43.5 39.0 - 52.0 %   MCV 88.1 78.0 - 100.0 fl   MCHC 33.6 30.0 - 36.0 g/dL   RDW 16.1 (H) 11.5 - 15.5 %   Platelets 244.0 150.0 - 400.0 K/uL   Neutrophils Relative % 50.7 43.0 - 77.0 %   Lymphocytes Relative 36.9 12.0 - 46.0 %   Monocytes Relative 8.9 3.0 - 12.0 %   Eosinophils Relative 2.8 0.0 - 5.0 %   Basophils Relative 0.7 0.0 - 3.0 %   Neutro Abs 3.2 1.4 - 7.7 K/uL   Lymphs Abs 2.3 0.7 - 4.0 K/uL   Monocytes Absolute 0.6 0.1 - 1.0 K/uL   Eosinophils Absolute 0.2 0.0 - 0.7 K/uL   Basophils Absolute 0.0 0.0 - 0.1 K/uL  Microalbumin / creatinine urine ratio     Status: Abnormal   Collection Time: 09/23/18 11:59 AM  Result Value Ref Range   Microalb, Ur 9.1 (H) 0.0 - 1.9 mg/dL   Creatinine,U 85.9 mg/dL   Microalb Creat Ratio 10.6 0.0 - 30.0 mg/g    Radiology No results found.  Assessment/Plan  Hyperlipidemia lipid control important in reducing the progression of atherosclerotic disease. Continue statin therapy   T2DM (type 2 diabetes mellitus) (HCC) blood glucose control important in reducing the progression of atherosclerotic disease. Also, involved in wound healing. On appropriate medications.   Carotid artery stenosis with cerebral infarction [facility]) Carotid stenosis of 1 to 39% on the right and 40 to 59% on the left.  Continue current medical regimen including aspirin and a statin agent.  Recheck in 1 year   Peripheral vascular disease in diabetes mellitus ([place]) His ABIs today are mildly reduced on the right at 0.82 and normal on the left at 1.0. It has been many years since his last intervention.  No role for intervention at this point.  Continue aspirin, statin, and cilostazol.  Recheck in 1 year    Leotis Pain,  MD  09/29/2018 2:45 PM    This note was created with Dragon medical transcription system.  Any errors from dictation are purely unintentional

## 2018-09-29 NOTE — Assessment & Plan Note (Signed)
His ABIs today are mildly reduced on the right at 0.82 and normal on the left at 1.0. It has been many years since his last intervention.  No role for intervention at this point.  Continue aspirin, statin, and cilostazol.  Recheck in 1 year

## 2018-09-29 NOTE — Assessment & Plan Note (Signed)
Carotid stenosis of 1 to 39% on the right and 40 to 59% on the left.  Continue current medical regimen including aspirin and a statin agent.  Recheck in 1 year

## 2018-09-29 NOTE — Assessment & Plan Note (Signed)
lipid control important in reducing the progression of atherosclerotic disease. Continue statin therapy  

## 2018-09-29 NOTE — Assessment & Plan Note (Signed)
blood glucose control important in reducing the progression of atherosclerotic disease. Also, involved in wound healing. On appropriate medications.  

## 2018-10-04 ENCOUNTER — Other Ambulatory Visit: Payer: Self-pay | Admitting: Internal Medicine

## 2018-10-07 ENCOUNTER — Ambulatory Visit: Payer: Medicare Other | Admitting: Internal Medicine

## 2018-10-07 ENCOUNTER — Encounter: Payer: Self-pay | Admitting: Internal Medicine

## 2018-10-07 VITALS — BP 134/74 | HR 70 | Temp 97.8°F | Resp 16 | Ht 71.0 in | Wt 206.0 lb

## 2018-10-07 DIAGNOSIS — L603 Nail dystrophy: Secondary | ICD-10-CM

## 2018-10-07 DIAGNOSIS — K529 Noninfective gastroenteritis and colitis, unspecified: Secondary | ICD-10-CM | POA: Diagnosis not present

## 2018-10-07 DIAGNOSIS — E1165 Type 2 diabetes mellitus with hyperglycemia: Secondary | ICD-10-CM

## 2018-10-07 DIAGNOSIS — Z794 Long term (current) use of insulin: Secondary | ICD-10-CM | POA: Diagnosis not present

## 2018-10-07 DIAGNOSIS — IMO0002 Reserved for concepts with insufficient information to code with codable children: Secondary | ICD-10-CM

## 2018-10-07 DIAGNOSIS — E1142 Type 2 diabetes mellitus with diabetic polyneuropathy: Secondary | ICD-10-CM

## 2018-10-07 MED ORDER — DULAGLUTIDE 0.75 MG/0.5ML ~~LOC~~ SOAJ: SUBCUTANEOUS | 3 refills | Status: DC

## 2018-10-07 NOTE — Patient Instructions (Addendum)
To get your sugars under control ,  You will need to adjust your insulin doses BASED ON ESTIMATING YOUR CARBOHYDRATE INTAKE   EXAMPLE:    If you are having a low carb  breakfast,  Reduce your insulin dose in the morning to 20 units   If you are having cereal,  Pancakes,  Fruit,  Or biscuits,  Take 22 units    Increase your evening insulin to 30 units and TAKE IT BEFORE YOUR DINNERTIME MEAL   STOP THE GLIPIZIDE  FOR NOW    SUBMIT BS READINGS ONCE A  WEEK FOR ME TO REVIEW    STAY OFF THE METFORMIN    We are starting Trulicity  once a week shot;  For diabetes and heart PROTECTION.

## 2018-10-07 NOTE — Progress Notes (Signed)
Subjective:  Patient ID: Henry Lindsey, male    DOB: 1933/09/10  Age: 84 y.o. MRN: [id number]  CC: The primary encounter diagnosis was Dystrophic nail. Diagnoses of Uncontrolled type 2 diabetes mellitus with diabetic polyneuropathy, with long-term current use of insulin (HCC) and Chronic diarrhea were also pertinent to this visit.  HPI DREAM NODAL presents for follow up on uncontrolled  diabetes.  Patient IS FRUSTRATED because he can;t get his sugars under control .   Patient is NOT  following a low glycemic index diet and does not carb count. .  HE HAS STOPPED METFORMIN per direction  due to persistent diarrhea.     Patient is taking 22 units of mixed insulin  the am and 28 in the evening. .Fasting sugars have been > 150 continually.    He has had an eye exam in the last 12 months and checks feet regularly for signs of infection.  Patient does not walk barefoot outside because he has  PAD and diabetic neuropathy. Patient is up to date on all recommended vaccinations    Lab Results  Component Value Date   HGBA1C 8.4 (H) 09/23/2018     TAKING IMMODIUM FOR THE DIARRHEA.  TWICE DAILY STOOLS ARE MORE FIRM.  RECTAL IRRITATION IS IMPROVING    Outpatient Medications Prior to Visit  Medication Sig Dispense Refill  . albuterol (PROVENTIL HFA;VENTOLIN HFA) 108 (90 Base) MCG/ACT inhaler Inhale 1-2 puffs into the lungs every 6 (six) hours as needed for wheezing or shortness of breath (cough). 1 Inhaler 0  . Alum & Mag Hydroxide-Simeth (MAGIC MOUTHWASH) SOLN Take 5 mLs by mouth 3 (three) times daily as needed for mouth pain.    [name] aspirin EC 325 MG tablet Take 325 mg by mouth daily.    [name] atorvastatin (LIPITOR) 40 MG tablet TAKE 1 TABLET BY MOUTH EVERY DAY 90 tablet 0  . Blood Glucose Monitoring Suppl (ONE TOUCH ULTRA SYSTEM KIT) w/Device KIT 1 kit by Does not apply route once. Use DX code E11.59 1 each 0  . Cholecalciferol (VITAMIN D3) 2000 units TABS Take 1 tablet by mouth daily.    .  cilostazol (PLETAL) 100 MG tablet TAKE 1 TABLET(100 MG) BY MOUTH TWICE DAILY 180 tablet 0  . Diphenhyd-Hydrocort-Nystatin (FIRST-DUKES MOUTHWASH) SUSP Swish 5 cc in mouth as needed or ulcers  And spit out 500 mL 1  . famotidine (PEPCID) 20 MG tablet TAKE 1 TABLET(20 MG) BY MOUTH TWICE DAILY 180 tablet 0  . fenofibrate micronized (LOFIBRA) 134 MG capsule TAKE 1 CAPSULE BY MOUTH EVERY MORNING BEFORE BREAKFAST 30 capsule 0  . gabapentin (NEURONTIN) 400 MG capsule TAKE 1 CAPSULE(400 MG) BY MOUTH THREE TIMES DAILY 270 capsule 0  . HYDROcodone-acetaminophen (NORCO/VICODIN) 5-325 MG tablet Take 1 tablet by mouth 2 (two) times daily as needed for moderate pain (KNEE PAIN). 60 tablet 0  . INS SYRINGE/NEEDLE 1CC/28G (B-D INSULIN SYRINGE 1CC/28G) 28G X 1/2" 1 ML MISC USE TO ADMINISTER INSULIN DAILY 100 each 0  . insulin lispro protamine-lispro (HUMALOG MIX 75/25) (75-25) 100 UNIT/ML SUSP injection Inject 22 units right before  breakfast and  27 units before dinner 10 mL 5  . insulin lispro protamine-lispro (HUMALOG MIX 75/25) (75-25) 100 UNIT/ML SUSP injection INJECT 20 UNITS UNDER THE SKIN RIGHT BEFORE BREAKFAST AND 25 UNITS UNDER THE SKIN BEFORE DINNER 10 mL 2  . latanoprost (XALATAN) 0.005 % ophthalmic solution INSTILL 1 DROP INTO BOTH EYES QD  5  . linagliptin (TRADJENTA) 5 MG TABS  tablet Take 1 tablet (5 mg total) by mouth daily. 90 tablet 1  . meloxicam (MOBIC) 15 MG tablet TAKE 1 TABLET(15 MG) BY MOUTH DAILY 30 tablet 1  . metoprolol succinate (TOPROL-XL) 100 MG 24 hr tablet TAKE 1 TABLET BY MOUTH EVERY DAY WITH OR IMMEDIATELY FOLLOWING A MEAL 90 tablet 0  . Multiple Vitamin (MULTIVITAMIN WITH MINERALS) TABS tablet Take 1 tablet by mouth daily.    . nitroGLYCERIN (NITROSTAT) 0.4 MG SL tablet Place 1 tablet (0.4 mg total) under the tongue every 5 (five) minutes as needed for chest pain. MAXIMUM 3 TABLETS 50 tablet 3  . ONE TOUCH ULTRA TEST test strip USE 1 STRIP TO TEST THREE TIMES DAILY AS DIRECTED 100  each 0  . ONETOUCH DELICA LANCETS 52D MISC Use three times daily to check blood sugar. 100 each 11  . pantoprazole (PROTONIX) 40 MG tablet TAKE 1 TABLET(40 MG) BY MOUTH TWICE DAILY BEFORE A MEAL 180 tablet 0  . tamsulosin (FLOMAX) 0.4 MG CAPS capsule TAKE 1 CAPSULE BY MOUTH EVERY DAY 90 capsule 1  . TANDEM 162-115.2 MG CAPS capsule TAKE 1 CAPSULE BY MOUTH EVERY DAY**TAKE WITH ORANGE JUICE OR VITAMIN CAPSULE** 30 capsule 0  . timolol (TIMOPTIC) 0.5 % ophthalmic solution INT 1 GTT IN OU QD  2  . glipiZIDE (GLUCOTROL) 10 MG tablet TAKE 1 TABLET(10 MG) BY MOUTH TWICE DAILY BEFORE A MEAL 180 tablet 0   No facility-administered medications prior to visit.     Review of Systems;  Patient denies headache, fevers, malaise, unintentional weight loss, skin rash, eye pain, sinus congestion and sinus pain, sore throat, dysphagia,  hemoptysis , cough, dyspnea, wheezing, chest pain, palpitations, orthopnea, edema, abdominal pain, nausea, melena, diarrhea, constipation, flank pain, dysuria, hematuria, urinary  Frequency, nocturia, numbness, tingling, seizures,  Focal weakness, Loss of consciousness,  Tremor, insomnia, depression, anxiety, and suicidal ideation.      Objective:  BP 134/74 (BP Location: Left Arm, Patient Position: Sitting, Cuff Size: Large)   Pulse 70   Temp 97.8 F (36.6 C) (Oral)   Resp 16   Ht '5\' 11"'  (1.803 m)   Wt 206 lb (93.4 kg)   SpO2 92%   BMI 28.73 kg/m   BP Readings from Last 3 Encounters:  10/07/18 134/74  09/29/18 (!) 149/76  09/23/18 (!) 154/80    Wt Readings from Last 3 Encounters:  10/07/18 206 lb (93.4 kg)  09/29/18 202 lb 12.8 oz (92 kg)  09/23/18 204 lb 6.4 oz (92.7 kg)    General appearance: alert, cooperative and appears stated age Ears: normal TM's and external ear canals both ears Throat: lips, mucosa, and tongue normal; teeth and gums normal Neck: no adenopathy, no carotid bruit, supple, symmetrical, trachea midline and thyroid not enlarged,  symmetric, no tenderness/mass/nodules Back: symmetric, no curvature. ROM normal. No CVA tenderness. Lungs: clear to auscultation bilaterally Heart: regular rate and rhythm, S1, S2 normal, no murmur, click, rub or gallop Abdomen: soft, non-tender; bowel sounds normal; no masses,  no organomegaly Pulses: 2+ and symmetric Skin: Skin color, texture, turgor normal. No rashes or lesions Lymph nodes: Cervical, supraclavicular, and axillary nodes normal.  Lab Results  Component Value Date   HGBA1C 8.4 (H) 09/23/2018   HGBA1C 7.7 (H) 06/11/2018   HGBA1C 7.8 (H) 03/09/2018    Lab Results  Component Value Date   CREATININE 1.08 09/23/2018   CREATININE 1.17 06/11/2018   CREATININE 1.15 03/09/2018    Lab Results  Component Value Date   WBC  6.3 09/23/2018   HGB 14.6 09/23/2018   HCT 43.5 09/23/2018   PLT 244.0 09/23/2018   GLUCOSE 163 (H) 09/23/2018   CHOL 94 06/11/2018   TRIG 78.0 06/11/2018   HDL 40.80 06/11/2018   LDLDIRECT 62.0 10/04/2016   LDLCALC 38 06/11/2018   ALT 20 09/23/2018   AST 17 09/23/2018   NA 139 09/23/2018   K 4.6 09/23/2018   CL 102 09/23/2018   CREATININE 1.08 09/23/2018   BUN 18 09/23/2018   CO2 31 09/23/2018   TSH 2.943 10/20/2016   PSA 0.83 10/10/2014   INR 1.22 11/21/2016   HGBA1C 8.4 (H) 09/23/2018   MICROALBUR 9.1 (H) 09/23/2018    No results found.  Assessment & Plan:   Problem List Items Addressed This Visit    Chronic diarrhea    Occurring daily since his cholecystectomy.  No unintentional weight loss.  He has no evidence of infection despite his colonoscopy biopsies showing active colitis.  His symptoms did not resolve with suspension of metformin , dairy and motrin. Lomotil requested but denied due to risk of use In  the aged. H will continue using Imodium  And a second opinion on the cause wil be advised  since he has not had any  Resolution despite seeing Dr Allen Norris .      Uncontrolled type 2 diabetes mellitus with diabetic polyneuropathy,  with long-term current use of insulin (HCC)    Metformin stopped due to diarrhea.  continue 22 unit in the am of 75/25 insulin ,  Increase evening dose to 30 units . Continue tradjenta, stop  glipizide., and start once daily Trulicity .  Lab Results  Component Value Date   HGBA1C 8.4 (H) 09/23/2018   Lab Results  Component Value Date   MICROALBUR 9.1 (H) 09/23/2018         Relevant Medications   Dulaglutide (TRULICITY) 8.85 OY/7.7AJ SOPN    Other Visit Diagnoses    Dystrophic nail    -  Primary   Relevant Orders   Ambulatory referral to Podiatry      I have discontinued Niko P. Barro's glipiZIDE. I am also having him start on Dulaglutide. Additionally, I am having him maintain his magic mouthwash, ONE TOUCH ULTRA SYSTEM KIT, ONETOUCH DELICA LANCETS 28N, latanoprost, INS SYRINGE/NEEDLE 1CC/28G, fenofibrate micronized, nitroGLYCERIN, multivitamin with minerals, Vitamin D3, tamsulosin, insulin lispro protamine-lispro, linagliptin, albuterol, timolol, HYDROcodone-acetaminophen, insulin lispro protamine-lispro, atorvastatin, pantoprazole, cilostazol, gabapentin, ONE TOUCH ULTRA TEST, metoprolol succinate, famotidine, TANDEM, FIRST-DUKES MOUTHWASH, aspirin EC, and meloxicam.  Meds ordered this encounter  Medications  . Dulaglutide (TRULICITY) 8.67 EH/2.0NO SOPN    Sig: inject 0.75 mg into skin once weekly    Dispense:  2 mL    Refill:  3    Medications Discontinued During This Encounter  Medication Reason  . glipiZIDE (GLUCOTROL) 10 MG tablet     Follow-up: No follow-ups on file.   [name], MD

## 2018-10-10 NOTE — Assessment & Plan Note (Addendum)
Occurring daily since his cholecystectomy.  No unintentional weight loss.  He has no evidence of infection despite his colonoscopy biopsies showing active colitis.  His symptoms did not resolve with suspension of metformin , dairy and motrin. Lomotil requested but denied due to risk of use In  the aged. H will continue using Imodium  And a second opinion on the cause wil be advised  since he has not had any  Resolution despite seeing Dr Allen Norris .

## 2018-10-10 NOTE — Assessment & Plan Note (Addendum)
Metformin stopped due to diarrhea.  continue 22 unit in the am of 75/25 insulin ,  Increase evening dose to 30 units . Continue tradjenta, stop  glipizide., and start once daily Trulicity .  Lab Results  Component Value Date   HGBA1C 8.4 (H) 09/23/2018   Lab Results  Component Value Date   MICROALBUR 9.1 (H) 09/23/2018

## 2018-10-22 ENCOUNTER — Other Ambulatory Visit: Payer: Self-pay | Admitting: Internal Medicine

## 2018-10-23 ENCOUNTER — Telehealth: Payer: Self-pay | Admitting: Internal Medicine

## 2018-10-23 DIAGNOSIS — E1165 Type 2 diabetes mellitus with hyperglycemia: Principal | ICD-10-CM

## 2018-10-23 DIAGNOSIS — Z794 Long term (current) use of insulin: Principal | ICD-10-CM

## 2018-10-23 DIAGNOSIS — IMO0002 Reserved for concepts with insufficient information to code with codable children: Secondary | ICD-10-CM

## 2018-10-23 DIAGNOSIS — E1142 Type 2 diabetes mellitus with diabetic polyneuropathy: Secondary | ICD-10-CM

## 2018-10-23 NOTE — Telephone Encounter (Signed)
Pt dropped off Blood Sugar reading that will delivered with the mail. Pt also had a a couple questions about his on going diarrhea.

## 2018-10-23 NOTE — Telephone Encounter (Signed)
Placed in labs folder.

## 2018-10-26 MED ORDER — CHOLESTYRAMINE LIGHT 4 G PO PACK: 4.0000 g | PACK | Freq: Two times a day (BID) | ORAL | 0 refills | Status: DC

## 2018-10-26 NOTE — Telephone Encounter (Signed)
His evening blood sugars are high due to dietary choices (I ce cream, pepsi).  Make sure he is taking 30 units of mixed insulin before he eats dinner   If he is taking it correctly ,  I recommend having him see Eula Fried,  Our Pharm D for help in managing his diabetes.   Lab Results  Component Value Date   HGBA1C 8.4 (H) 09/23/2018    For his diarrhea,  I am recommending a trial of a powdered medication called Questran  To  Take once daily initially  At mealtime .  Do not take it dry,  Mix in water or juice and drink right before meal  .  DO NOT TAKE OTHER MEDS WITH IT. THEY MAY NOT GET ABSORBED FULLY .  Needs to take other meds 1 hour b efore,  Or 4 hours afterward

## 2018-10-27 ENCOUNTER — Telehealth: Payer: Self-pay | Admitting: Internal Medicine

## 2018-10-27 NOTE — Telephone Encounter (Signed)
Pt. Reports he has been taking the 30 units at dinner time. Also, is not seeing any improvement in his blood sugars with Trulicity. Has one more week left. He had to pay $200 out of pocket for Trulicity." Not very happy with it."  Has cut sweets as well. Please send Questran to [facility].Wants to hold off seeing Eula Fried for now.

## 2018-10-27 NOTE — Telephone Encounter (Signed)
Copied from [place] [id number]. Topic: General - Other >> Oct 27, 2018  1:41 PM Yvette Rack wrote: Reason for CRM: pt calling about message that was left on his voice mail  Was told that nurse triage can give pt the message

## 2018-10-27 NOTE — Telephone Encounter (Signed)
See 10/23/18 telephone encounter

## 2018-10-27 NOTE — Telephone Encounter (Signed)
Pt returning call to office. [facility] nurse unavailable at this time. Tried office, no answer. Please advise. Pt did state he will be in the dentist for about the next hour.

## 2018-10-27 NOTE — Telephone Encounter (Signed)
Left message for patient to return call to office, [facility] nurse may advise . 

## 2018-10-27 NOTE — Telephone Encounter (Signed)
I would never have expected him to pay $200 for any medication!  Do not refill the Trulicity.  Instead he can increase his humalog by 3 units both morning and evening and check BS for one week fasting and at bedtime and send me readings

## 2018-10-28 ENCOUNTER — Other Ambulatory Visit: Payer: Self-pay | Admitting: Internal Medicine

## 2018-10-28 NOTE — Telephone Encounter (Signed)
Spoke with patient needs refill on hydrocodone, last  02/24/18 for 60 patient voiced understanding to new insulin regimen.

## 2018-10-28 NOTE — Telephone Encounter (Signed)
Copied from [place] [id number]. Topic: Quick Communication - Rx Refill/Question >> Oct 28, 2018 12:06 PM Wynetta Emery, [place] C wrote: Medication: HYDROcodone-acetaminophen (NORCO/VICODIN) 5-325 MG tablet   Has the patient contacted their pharmacy? Yes   (Agent: If no, request that the patient contact the pharmacy for the refill.) (Agent: If yes, when and what did the pharmacy advise?)  Preferred Pharmacy (with phone number or street name): [facility] #[id number] - [place], [place]: Please be advised that RX refills may take up to 3 business days. We ask that you follow-up with your pharmacy.

## 2018-10-28 NOTE — Telephone Encounter (Signed)
I thought I had responded to this already ("do not refill the Trulicty,) if you don't see that response,  Please relay directive and ask him to  increase the 75/25 insulin dose to 33 units

## 2018-10-29 ENCOUNTER — Other Ambulatory Visit: Payer: Self-pay | Admitting: Internal Medicine

## 2018-10-29 MED ORDER — HYDROCODONE-ACETAMINOPHEN 5-325 MG PO TABS: 1.0000 | ORAL_TABLET | Freq: Two times a day (BID) | ORAL | 0 refills | Status: DC | PRN

## 2018-10-29 NOTE — Telephone Encounter (Signed)
See previous message

## 2018-11-07 ENCOUNTER — Other Ambulatory Visit: Payer: Self-pay | Admitting: Internal Medicine

## 2018-11-09 ENCOUNTER — Telehealth: Payer: Self-pay | Admitting: Internal Medicine

## 2018-11-09 NOTE — Telephone Encounter (Signed)
Placed in yellow results folder.  °

## 2018-11-09 NOTE — Telephone Encounter (Signed)
Pt dropped off Blood sugar readings. Readings are up front in color folder. These readings are from the last 2 weeks with Tricity. Pt stated he did not want to take Tricity again.  Pt's stools are almost back to normal and good color.

## 2018-11-12 NOTE — Telephone Encounter (Signed)
LMTCB. PEC may speak with pt.  

## 2018-11-12 NOTE — Telephone Encounter (Signed)
BLOOD SUGARS ARE MUCH BETTER WITH TRULICITY.  IF HE IS GOING TO STOP USING IT, he'll need to increase his evening dose of insulin from 30 units to 35 units and his morning dose from 22 untis to 25 units  Resubmit BS log in 2 weeks

## 2018-11-12 NOTE — Telephone Encounter (Signed)
Pt stated that that is what he did on his own.  He has been doing.  He increase it to 25 and 35 and stated it is working really well.

## 2018-11-16 ENCOUNTER — Other Ambulatory Visit: Payer: Self-pay | Admitting: Internal Medicine

## 2018-11-20 ENCOUNTER — Ambulatory Visit: Payer: Self-pay

## 2018-11-20 ENCOUNTER — Other Ambulatory Visit: Payer: Self-pay | Admitting: Internal Medicine

## 2018-11-20 MED ORDER — PAREGORIC 2 MG/5ML PO TINC: 5.0000 mL | ORAL | 0 refills | Status: DC | PRN

## 2018-11-20 NOTE — Telephone Encounter (Signed)
Patient called and he says that he's been having diarrhea for the past year and he's used about 7-8 bottles of Imodium over the course of 4 weeks. He says it's getting expensive to keep buying bottles of Imodium and would like Dr. Derrel Nip or prescribe Paregoric for the diarrhea. He says he called the pharmacist and they said they have it and all he needs is a prescription. He says he's had 7 diarrhea stools, watery and black stools, in the past 24 hours. He says his rectum is raw and bleeds from the diarrhea and he's using hemorrhoid pads for that. He denies vomiting, abdominal pain. According to protocol, see PCP within 2 weeks, patient says he has already been in for this problem and he would like Dr. Derrel Nip to prescribe the Paregoric to try and he says if it doesn't work, he will go back to the GI for a colonoscopy. I advised I will send to Dr. Derrel Nip and someone will call with her recommendation.    Message from [place] sent at 11/20/2018 1:22 PM EST   Pt states he has got diarrhea pretty bad again and has used about 7-8 bottles of Immodium over the course of 4 weeks. He said they are about $8 a piece and that is getting to be expensive. He would like Dr Derrel Nip to call him in " [place] " ?    Reason for Disposition . Diarrhea is a chronic symptom (recurrent or ongoing AND present > 4 weeks)  Answer Assessment - Initial Assessment Questions 1. DIARRHEA SEVERITY: "How bad is the diarrhea?" "How many extra stools have you had in the past 24 hours than normal?"    - NO DIARRHEA (SCALE 0)   - MILD (SCALE 1-3): Few loose or mushy BMs; increase of 1-3 stools over normal daily number of stools; mild increase in ostomy output.   -  MODERATE (SCALE 4-7): Increase of 4-6 stools daily over normal; moderate increase in ostomy output. * SEVERE (SCALE 8-10; OR 'WORST POSSIBLE'): Increase of 7 or more stools daily over normal; moderate increase in ostomy output; incontinence.     7 2. ONSET: "When did  the diarrhea begin?"      1 year ago 3. BM CONSISTENCY: "How loose or watery is the diarrhea?"      Watery 4. VOMITING: "Are you also vomiting?" If so, ask: "How many times in the past 24 hours?"      No 5. ABDOMINAL PAIN: "Are you having any abdominal pain?" If yes: "What does it feel like?" (e.g., crampy, dull, intermittent, constant)      No 6. ABDOMINAL PAIN SEVERITY: If present, ask: "How bad is the pain?"  (e.g., Scale 1-10; mild, moderate, or severe)   - MILD (1-3): doesn't interfere with normal activities, abdomen soft and not tender to touch    - MODERATE (4-7): interferes with normal activities or awakens from sleep, tender to touch    - SEVERE (8-10): excruciating pain, doubled over, unable to do any normal activities       N/A 7. ORAL INTAKE: If vomiting, "Have you been able to drink liquids?" "How much fluids have you had in the past 24 hours?"     N/A 8. HYDRATION: "Any signs of dehydration?" (e.g., dry mouth [not just dry lips], too weak to stand, dizziness, new weight loss) "When did you last urinate?"     No 9. EXPOSURE: "Have you traveled to a foreign country recently?" "Have you been exposed to anyone  with diarrhea?" "Could you have eaten any food that was spoiled?"     No 10. ANTIBIOTIC USE: "Are you taking antibiotics now or have you taken antibiotics in the past 2 months?"     No 11. OTHER SYMPTOMS: "Do you have any other symptoms?" (e.g., fever, blood in stool)      No 12. PREGNANCY: "Is there any chance you are pregnant?" "When was your last menstrual period?"       N/A  Protocols used: DIARRHEA-A-AH

## 2018-11-20 NOTE — Telephone Encounter (Signed)
Patient notified & stated that he would call if he was not better on Monday.

## 2018-11-20 NOTE — Telephone Encounter (Signed)
Paregoric has been sent to [facility]

## 2018-11-24 ENCOUNTER — Telehealth: Payer: Self-pay | Admitting: Internal Medicine

## 2018-11-24 NOTE — Telephone Encounter (Signed)
HE CALLED last Friday and told me that his pharmacy had it!!  Lomotil is not safe in patients his age.

## 2018-11-24 NOTE — Telephone Encounter (Signed)
Copied from [place] [phone]. Topic: Quick Communication - See Telephone Encounter >> Nov 24, 2018  9:17 AM Sheran Luz wrote: CRM for notification. See Telephone encounter for: 11/24/18.  Patient called stating that pharmacy does not have paregoric 2 MG/5ML solution and inquired if an alternative could be sent in, preferably Lomotil. Please advise.    Pharmacy:[id number] [address], [place]  [phone] (Phone) [phone] (Fax)

## 2018-11-25 ENCOUNTER — Other Ambulatory Visit: Payer: Self-pay | Admitting: Internal Medicine

## 2018-11-25 NOTE — Telephone Encounter (Signed)
Spoke with pt and he stated that when he called the pharmacy last week they did tell him that they had the medication but when he went to pick it up they no longer had it and told him that they would not be able to get it because the manufacturer had quit making it. The pt also stated that he is aware that the Lomotil is not safe for him and understands if Dr. Derrel Nip does not want to prescribe it to him. The pt stated that he would go back to taking the nutmeg once in while since it seemed to have helped and he also stated that he still has some Immodium left and that seemed to have helped him the most.   Pt also wanted to let Dr. Derrel Nip know that his sugar readings are still doing good and that he will drop off his readings sometime this week.

## 2018-11-27 ENCOUNTER — Other Ambulatory Visit: Payer: Self-pay

## 2018-11-27 ENCOUNTER — Telehealth: Payer: Self-pay | Admitting: Internal Medicine

## 2018-11-27 ENCOUNTER — Other Ambulatory Visit: Payer: Self-pay | Admitting: Internal Medicine

## 2018-11-27 DIAGNOSIS — IMO0002 Reserved for concepts with insufficient information to code with codable children: Secondary | ICD-10-CM

## 2018-11-27 DIAGNOSIS — Z794 Long term (current) use of insulin: Principal | ICD-10-CM

## 2018-11-27 DIAGNOSIS — E1165 Type 2 diabetes mellitus with hyperglycemia: Principal | ICD-10-CM

## 2018-11-27 DIAGNOSIS — E1142 Type 2 diabetes mellitus with diabetic polyneuropathy: Secondary | ICD-10-CM

## 2018-11-27 MED ORDER — HYDROCODONE-ACETAMINOPHEN 5-325 MG PO TABS: 1.0000 | ORAL_TABLET | Freq: Two times a day (BID) | ORAL | 0 refills | Status: DC | PRN

## 2018-11-27 NOTE — Telephone Encounter (Signed)
Pt dropped off blood sugar readings. Readings are up front in color folder.

## 2018-11-27 NOTE — Telephone Encounter (Signed)
Please ask him to increase his evening dose of insulin to 32 units , from previous dose of 30 units  Continue 22 units with breakfast

## 2018-11-27 NOTE — Telephone Encounter (Signed)
Copied from [place] [id number]. Topic: Quick Communication - Rx Refill/Question >> Nov 27, 2018  9:03 AM Rayann Heman wrote: Medication:HYDROcodone-acetaminophen (NORCO/VICODIN) 5-325 MG tablet [[id number]]   Has the patient contacted their pharmacy?no Preferred Pharmacy (with phone number or street name): [id number], [place] [phone] (Phone) [phone] (Fax)  Agent: Please be advised that RX refills may take up to 3 business days. We ask that you follow-up with your pharmacy.

## 2018-11-27 NOTE — Telephone Encounter (Signed)
Spoke with pt and informed him that he needs to increase his insulin to 32 units in the evening and continue the 22 units with breakfast. Pt gave a verbal understanding.

## 2018-11-27 NOTE — Telephone Encounter (Signed)
Refilled: 10/29/2018 Last OV: 10/07/2018 Next OV: not scheduled

## 2018-11-27 NOTE — Assessment & Plan Note (Signed)
January sugars reviewd,  Evening dose of insulin increased to 32 units.  Continue 22 untis in the am

## 2018-11-27 NOTE — Telephone Encounter (Signed)
This was refilled today through another request.  please confirm that it as received by pharmacy or patient

## 2018-11-27 NOTE — Telephone Encounter (Signed)
Placed in yellow results folder.  °

## 2018-12-08 ENCOUNTER — Other Ambulatory Visit: Payer: Self-pay

## 2018-12-08 ENCOUNTER — Other Ambulatory Visit: Payer: Self-pay | Admitting: Internal Medicine

## 2018-12-08 MED ORDER — FERROUS FUM-IRON POLYSACCH 162-115.2 MG PO CAPS: ORAL_CAPSULE | ORAL | 2 refills | Status: DC

## 2018-12-24 ENCOUNTER — Ambulatory Visit: Payer: Medicare Other | Admitting: Internal Medicine

## 2018-12-24 ENCOUNTER — Encounter: Payer: Self-pay | Admitting: Internal Medicine

## 2018-12-24 VITALS — BP 136/68 | HR 59 | Temp 97.5°F | Resp 16 | Ht 71.0 in | Wt 203.2 lb

## 2018-12-24 DIAGNOSIS — E1165 Type 2 diabetes mellitus with hyperglycemia: Secondary | ICD-10-CM | POA: Diagnosis not present

## 2018-12-24 DIAGNOSIS — E1142 Type 2 diabetes mellitus with diabetic polyneuropathy: Secondary | ICD-10-CM | POA: Diagnosis not present

## 2018-12-24 DIAGNOSIS — Z794 Long term (current) use of insulin: Secondary | ICD-10-CM

## 2018-12-24 DIAGNOSIS — K529 Noninfective gastroenteritis and colitis, unspecified: Secondary | ICD-10-CM | POA: Diagnosis not present

## 2018-12-24 DIAGNOSIS — IMO0002 Reserved for concepts with insufficient information to code with codable children: Secondary | ICD-10-CM

## 2018-12-24 DIAGNOSIS — E782 Mixed hyperlipidemia: Secondary | ICD-10-CM

## 2018-12-24 LAB — COMPREHENSIVE METABOLIC PANEL
ALT: 17 U/L (ref 0–53)
AST: 16 U/L (ref 0–37)
Albumin: 4 g/dL (ref 3.5–5.2)
Alkaline Phosphatase: 77 U/L (ref 39–117)
BUN: 17 mg/dL (ref 6–23)
CO2: 28 mEq/L (ref 19–32)
Calcium: 9 mg/dL (ref 8.4–10.5)
Chloride: 106 mEq/L (ref 96–112)
Creatinine, Ser: 1.16 mg/dL (ref 0.40–1.50)
GFR: 60.03 mL/min (ref >60.00)
Glucose, Bld: 198 mg/dL — ABNORMAL HIGH (ref 70–99)
Potassium: 4.7 mEq/L (ref 3.5–5.1)
Sodium: 139 mEq/L (ref 135–145)
Total Bilirubin: 0.7 mg/dL (ref 0.2–1.2)
Total Protein: 6.8 g/dL (ref 6.0–8.3)

## 2018-12-24 LAB — LIPID PANEL
Cholesterol: 107 mg/dL (ref 0–200)
HDL: 35.9 mg/dL — ABNORMAL LOW (ref >39.00)
LDL CALC: 38 mg/dL (ref 0–99)
NonHDL: 70.66
Total CHOL/HDL Ratio: 3
Triglycerides: 164 mg/dL — ABNORMAL HIGH (ref 0.0–149.0)
VLDL: 32.8 mg/dL (ref 0.0–40.0)

## 2018-12-24 LAB — SEDIMENTATION RATE: Sed Rate: 8 mm/hr (ref 0–20)

## 2018-12-24 LAB — MAGNESIUM: Magnesium: 1.8 mg/dL (ref 1.5–2.5)

## 2018-12-24 LAB — HEMOGLOBIN A1C: Hgb A1c MFr Bld: 7.9 % — ABNORMAL HIGH (ref 4.6–6.5)

## 2018-12-24 MED ORDER — HYDROCODONE-ACETAMINOPHEN 5-325 MG PO TABS: 1.0000 | ORAL_TABLET | Freq: Two times a day (BID) | ORAL | 0 refills | Status: DC | PRN

## 2018-12-24 NOTE — Patient Instructions (Addendum)
Your diarrhea may be due to one of your medications that we have not stopped yet.    1) Suspend pantoprazole AND famotidine for one week .  These medications treat heartburn ,  But can cause diarrhea in some patients  If the diarrhea stops,  Then we know the cause was one of these medications.  If it does not stop,  We will try cholestyramine  This treats diarrhea due to having no gallbladder   I have refilled your hydrocodone for 3 more months

## 2018-12-24 NOTE — Progress Notes (Signed)
Subjective:  Patient ID: Henry Lindsey, male    DOB: [birth year]  Age: 85 y.o. MRN: [id number]  CC: The primary encounter diagnosis was Uncontrolled type 2 diabetes mellitus with diabetic polyneuropathy, with long-term current use of insulin ([place]). Diagnoses of Chronic diarrhea and Mixed hyperlipidemia were also pertinent to this visit.  HPI Henry Lindsey presents for follow up on type 2 DM,  Chronic diarrhea that has persisted for the last 1.5 years, several months after his cholecystectomy in Jan 2018.   April 2019 colonoscopy showed active colitis but no therapy was offered during follow up with Dr Allen Norris.  Patient has been taking Imodium.  He has tried omitting metformin, meloxicam , and dairy  with no change in symptoms,  He has up to 7 loose stools daily.  . Did not try cholestyramine that was prescribed in January .  He is taking protonix and pepcid and has not suspended them   Type 2 DM:  He reduced his 75/ 25 insulin doses to  to 22 units in the am  And  27 units before dinner.  Thought the insulin was giving him thrush .  Last knee injection of steroids was 4 months ago,  Has not had any oral prednisone. did not start the tradjenta either.  Chronic knee pain: managed with tramadol and two vicodin daily     Lab Results  Component Value Date   HGBA1C 7.9 (H) 12/24/2018     Outpatient Medications Prior to Visit  Medication Sig Dispense Refill  . albuterol (PROVENTIL HFA;VENTOLIN HFA) 108 (90 Base) MCG/ACT inhaler Inhale 1-2 puffs into the lungs every 6 (six) hours as needed for wheezing or shortness of breath (cough). 1 Inhaler 0  . Alum & Mag Hydroxide-Simeth (MAGIC MOUTHWASH) SOLN Take 5 mLs by mouth 3 (three) times daily as needed for mouth pain.    [name] aspirin EC 325 MG tablet Take 325 mg by mouth daily.    [name] atorvastatin (LIPITOR) 40 MG tablet TAKE 1 TABLET BY MOUTH EVERY DAY 90 tablet 0  . Blood Glucose Monitoring Suppl (ONE TOUCH ULTRA SYSTEM KIT) w/Device KIT 1 kit by Does not  apply route once. Use DX code E11.59 1 each 0  . Cholecalciferol (VITAMIN D3) 2000 units TABS Take 1 tablet by mouth daily.    . cilostazol (PLETAL) 100 MG tablet TAKE 1 TABLET BY MOUTH TWICE DAILY 180 tablet 0  . Diphenhyd-Hydrocort-Nystatin (FIRST-DUKES MOUTHWASH) SUSP Swish 5 cc in mouth as needed or ulcers  And spit out 500 mL 1  . fenofibrate micronized (LOFIBRA) 134 MG capsule TAKE 1 CAPSULE BY MOUTH EVERY MORNING BEFORE BREAKFAST 30 capsule 0  . ferrous fumarate-iron polysaccharide complex (TANDEM) 162-115.2 MG CAPS capsule TAKE 1 CAPSULE BY MOUTH EVERY DAY**TAKE WITH ORANGE JUICE OR VITAMIN CAPSULE** 30 capsule 2  . gabapentin (NEURONTIN) 400 MG capsule TAKE 1 CAPSULE(400 MG) BY MOUTH THREE TIMES DAILY 270 capsule 0  . glipiZIDE (GLUCOTROL) 10 MG tablet TAKE 1 TABLET(10 MG) BY MOUTH TWICE DAILY BEFORE A MEAL 180 tablet 0  . INS SYRINGE/NEEDLE 1CC/28G (B-D INSULIN SYRINGE 1CC/28G) 28G X 1/2" 1 ML MISC USE TO ADMINISTER INSULIN DAILY 100 each 0  . insulin lispro protamine-lispro (HUMALOG MIX 75/25) (75-25) 100 UNIT/ML SUSP injection Inject 22 units right before  breakfast and  27 units before dinner 10 mL 5  . latanoprost (XALATAN) 0.005 % ophthalmic solution INSTILL 1 DROP INTO BOTH EYES QD  5  . meloxicam (MOBIC) 15 MG tablet TAKE  1 TABLET(15 MG) BY MOUTH DAILY 30 tablet 1  . metoprolol succinate (TOPROL-XL) 100 MG 24 hr tablet TAKE 1 TABLET BY MOUTH EVERY DAY WITH OR IMMEDIATELY FOLLOWING A MEAL 90 tablet 0  . Multiple Vitamin (MULTIVITAMIN WITH MINERALS) TABS tablet Take 1 tablet by mouth daily.    . nitroGLYCERIN (NITROSTAT) 0.4 MG SL tablet Place 1 tablet (0.4 mg total) under the tongue every 5 (five) minutes as needed for chest pain. MAXIMUM 3 TABLETS 50 tablet 3  . ONE TOUCH ULTRA TEST test strip USE 1 STRIP TO TEST THREE TIMES DAILY AS DIRECTED 100 each 0  . ONETOUCH DELICA LANCETS 50D MISC Use three times daily to check blood sugar. 100 each 11  . tamsulosin (FLOMAX) 0.4 MG CAPS  capsule TAKE 1 CAPSULE(0.4 MG) BY MOUTH DAILY 90 capsule 1  . timolol (TIMOPTIC) 0.5 % ophthalmic solution INT 1 GTT IN OU QD  2  . cholestyramine light (PREVALITE) 4 g packet Take 1 packet (4 g total) by mouth 2 (two) times daily. 60 packet 0  . famotidine (PEPCID) 20 MG tablet TAKE 1 TABLET(20 MG) BY MOUTH TWICE DAILY 180 tablet 0  . HYDROcodone-acetaminophen (NORCO/VICODIN) 5-325 MG tablet Take 1 tablet by mouth 2 (two) times daily as needed for moderate pain (KNEE PAIN). 60 tablet 0  . insulin lispro protamine-lispro (HUMALOG MIX 75/25) (75-25) 100 UNIT/ML SUSP injection INJECT 20 UNITS UNDER THE SKIN RIGHT BEFORE BREAKFAST AND 25 UNITS UNDER THE SKIN BEFORE DINNER 10 mL 2  . linagliptin (TRADJENTA) 5 MG TABS tablet Take 1 tablet (5 mg total) by mouth daily. 90 tablet 1  . pantoprazole (PROTONIX) 40 MG tablet TAKE 1 TABLET(40 MG) BY MOUTH TWICE DAILY BEFORE A MEAL 180 tablet 0  . paregoric 2 MG/5ML solution Take 5 mLs by mouth as needed for diarrhea or loose stools. (Patient not taking: Reported on 12/24/2018) 120 mL 0   No facility-administered medications prior to visit.     Review of Systems;  Patient denies headache, fevers, malaise, unintentional weight loss, skin rash, eye pain, sinus congestion and sinus pain, sore throat, dysphagia,  hemoptysis , cough, dyspnea, wheezing, chest pain, palpitations, orthopnea, edema, abdominal pain, nausea, melena, diarrhea, constipation, flank pain, dysuria, hematuria, urinary  Frequency, nocturia, numbness, tingling, seizures,  Focal weakness, Loss of consciousness,  Tremor, insomnia, depression, anxiety, and suicidal ideation.      Objective:  BP 136/68 (BP Location: Left Arm, Patient Position: Sitting, Cuff Size: Normal)   Pulse (!) 59   Temp (!) 97.5 F (36.4 C) (Oral)   Resp 16   Ht _0  (1.803 m)   Wt 203 lb 3.2 oz (92.2 kg)   SpO2 93%   BMI 28.34 kg/m   BP Readings from Last 3 Encounters:  12/24/18 136/68  10/07/18 134/74  09/29/18  (!) 149/76    Wt Readings from Last 3 Encounters:  12/24/18 203 lb 3.2 oz (92.2 kg)  10/07/18 206 lb (93.4 kg)  09/29/18 202 lb 12.8 oz (92 kg)    General appearance: alert, cooperative and appears stated age Ears: normal TM's and external ear canals both ears Throat: lips, mucosa, and tongue normal; teeth and gums normal Neck: no adenopathy, no carotid bruit, supple, symmetrical, trachea midline and thyroid not enlarged, symmetric, no tenderness/mass/nodules Back: symmetric, no curvature. ROM normal. No CVA tenderness. Lungs: clear to auscultation bilaterally Heart: regular rate and rhythm, S1, S2 normal, no murmur, click, rub or gallop Abdomen: soft, non-tender; bowel sounds normal; no masses,  no organomegaly Pulses: 2+ and symmetric Skin: Skin color, texture, turgor normal. No rashes or lesions Lymph nodes: Cervical, supraclavicular, and axillary nodes normal.  Lab Results  Component Value Date   HGBA1C 7.9 (H) 12/24/2018   HGBA1C 8.4 (H) 09/23/2018   HGBA1C 7.7 (H) 06/11/2018    Lab Results  Component Value Date   CREATININE 1.16 12/24/2018   CREATININE 1.08 09/23/2018   CREATININE 1.17 06/11/2018    Lab Results  Component Value Date   WBC 6.3 09/23/2018   HGB 14.6 09/23/2018   HCT 43.5 09/23/2018   PLT 244.0 09/23/2018   GLUCOSE 198 (H) 12/24/2018   CHOL 107 12/24/2018   TRIG 164.0 (H) 12/24/2018   HDL 35.90 (L) 12/24/2018   LDLDIRECT 62.0 10/04/2016   LDLCALC 38 12/24/2018   ALT 17 12/24/2018   AST 16 12/24/2018   NA 139 12/24/2018   K 4.7 12/24/2018   CL 106 12/24/2018   CREATININE 1.16 12/24/2018   BUN 17 12/24/2018   CO2 28 12/24/2018   TSH 2.943 10/20/2016   PSA 0.83 10/10/2014   INR 1.22 11/21/2016   HGBA1C 7.9 (H) 12/24/2018   MICROALBUR 9.1 (H) 09/23/2018    No results found.  Assessment & Plan:   Problem List Items Addressed This Visit    Hyperlipidemia   Relevant Orders   Lipid panel (Completed)   Uncontrolled type 2 diabetes  mellitus with diabetic polyneuropathy, with long-term current use of insulin ([place]) - Primary    Uncontrolled due to patient's failure to aggressively manage his diabetes.significant improvement noted,  But patient advised to increase his insulin by 3 units twice daily   Lab Results  Component Value Date   HGBA1C 7.9 (H) 12/24/2018         Relevant Orders   Hemoglobin A1c (Completed)   Chronic diarrhea    ADVISED TO STOP PROTONOIX AND PEPCID FOR ONE WEEK.   Will add questran in one week if diarrhea persists., followed by a Trial of Entocort given active colitis noted on prior colonoscopy by Dr Allen Norris      Relevant Orders   Magnesium (Completed)   Comprehensive metabolic panel (Completed)   Sedimentation rate (Completed)      I have discontinued Jazziel P. Zalar's linagliptin, pantoprazole, cholestyramine light, paregoric, and famotidine. I have also changed his HYDROcodone-acetaminophen and HYDROcodone-acetaminophen. Additionally, I am having him maintain his magic mouthwash, ONE TOUCH ULTRA SYSTEM KIT, OneTouch Delica Lancets 74J, latanoprost, INS SYRINGE/NEEDLE 1CC/28G, fenofibrate micronized, nitroGLYCERIN, multivitamin with minerals, Vitamin D3, insulin lispro protamine-lispro, albuterol, timolol, gabapentin, ONE TOUCH ULTRA TEST, [facility] Mouthwash, aspirin EC, meloxicam, atorvastatin, cilostazol, glipiZIDE, tamsulosin, metoprolol succinate, ferrous fumarate-iron polysaccharide complex, and HYDROcodone-acetaminophen.  Meds ordered this encounter  Medications  . HYDROcodone-acetaminophen (NORCO/VICODIN) 5-325 MG tablet    Sig: Take 1 tablet by mouth 2 (two) times daily as needed for moderate pain (KNEE PAIN).    Dispense:  60 tablet    Refill:  0  . HYDROcodone-acetaminophen (NORCO/VICODIN) 5-325 MG tablet    Sig: Take 1 tablet by mouth 2 (two) times daily as needed.    Dispense:  60 tablet    Refill:  0  . HYDROcodone-acetaminophen (NORCO/VICODIN) 5-325 MG tablet    Sig: Take 1  tablet by mouth 2 (two) times daily as needed. May refill on or after Nov 22 2012    Dispense:  60 tablet    Refill:  0    Medications Discontinued During This Encounter  Medication Reason  . paregoric 2 MG/5ML  solution Prescription never filled  . insulin lispro protamine-lispro (HUMALOG MIX 75/25) (75-25) 100 UNIT/ML SUSP injection   . HYDROcodone-acetaminophen (NORCO/VICODIN) 5-325 MG tablet Reorder  . cholestyramine light (PREVALITE) 4 g packet   . famotidine (PEPCID) 20 MG tablet   . linagliptin (TRADJENTA) 5 MG TABS tablet   . pantoprazole (PROTONIX) 40 MG tablet     Follow-up: No follow-ups on file.   [name], MD

## 2018-12-24 NOTE — Assessment & Plan Note (Addendum)
ADVISED TO STOP PROTONOIX AND PEPCID FOR ONE WEEK.   Will add questran in one week if diarrhea persists., followed by a Trial of Entocort given active colitis noted on prior colonoscopy by Dr Allen Norris

## 2018-12-26 NOTE — Assessment & Plan Note (Signed)
Uncontrolled due to patient's failure to aggressively manage his diabetes.significant improvement noted,  But patient advised to increase his insulin by 3 units twice daily   Lab Results  Component Value Date   HGBA1C 7.9 (H) 12/24/2018

## 2018-12-26 NOTE — Assessment & Plan Note (Signed)
Still severe despite recent I/Ainjection of steroids  In right knee and prior left  knee replacement,   Continue vicodin  Daily . Refills given today for 3 months after Refill history confirmed via [place] Controlled Substance database, accessed by me today.[name]

## 2018-12-29 ENCOUNTER — Ambulatory Visit: Payer: Self-pay

## 2018-12-29 MED ORDER — CHOLESTYRAMINE 4 GM/DOSE PO POWD: 2.0000 g | Freq: Three times a day (TID) | ORAL | 12 refills | Status: DC

## 2018-12-29 NOTE — Addendum Note (Signed)
Addended by: [name] on: 12/29/2018 03:35 PM   Modules accepted: Orders

## 2018-12-29 NOTE — Telephone Encounter (Signed)
  Reason for Disposition . [1] Follow-up call from patient regarding patient's clinical status AND [2] information NON-URGENT  Answer Assessment - Initial Assessment Questions 1. REASON FOR CALL or QUESTION: "What is your reason for calling today?" or "How can I best help you?" or "What question do you have that I can help answer?"     Pt called and stated he would like to start the cholestyramine. Pt stated he stopped the Protonix and Pepcid for a week and the diarrhea has persisted. Pt stated that his stools are black in color. Pt stated he informed Dr Derrel Nip 2. CALLER: Document the source of call. (e.g., laboratory, patient).     patient  Protocols used: PCP CALL - NO TRIAGE-A-AH

## 2018-12-29 NOTE — Telephone Encounter (Signed)
Generic Questran sent to pharmacy  Direction  1/2 pack in water three times daily before meals

## 2018-12-29 NOTE — Telephone Encounter (Signed)
Called and spoke w/ pt.  Pt aware that Rx has been called in for Generic Questran to pharmacy.  Instructions for taking med given per PCP's note.

## 2019-01-04 ENCOUNTER — Telehealth: Payer: Self-pay

## 2019-01-04 NOTE — Telephone Encounter (Signed)
Copied from [place] [id number]. Topic: General - Other >> Jan 04, 2019 11:59 AM Sheran Luz wrote: Reason for CRM: Patient just calling as FYI to let Dr. Derrel Nip know that the medication cholestyramine Lucrezia Starch) 4 GM/DOSE powder seems to be working well for his diarrhea. He just wanted to leave an update and say thank you, as he has been going through this for about a year.

## 2019-01-05 ENCOUNTER — Other Ambulatory Visit: Payer: Self-pay

## 2019-01-05 MED ORDER — MELOXICAM 15 MG PO TABS: ORAL_TABLET | ORAL | 1 refills | Status: DC

## 2019-01-12 ENCOUNTER — Other Ambulatory Visit: Payer: Self-pay

## 2019-01-12 MED ORDER — ATORVASTATIN CALCIUM 40 MG PO TABS: 40.0000 mg | ORAL_TABLET | Freq: Every day | ORAL | 0 refills | Status: DC

## 2019-01-14 ENCOUNTER — Telehealth: Payer: Self-pay | Admitting: Internal Medicine

## 2019-01-14 NOTE — Telephone Encounter (Signed)
I cannot advise without more information .    How many loose stools daily is he having?  How many times daily is he taking the  [place]

## 2019-01-14 NOTE — Telephone Encounter (Signed)
Copied from [place] [phone]. Topic: General - Other >> Jan 14, 2019  4:33 PM Keene Breath wrote: Reason for CRM: Patient called to inform the doctor that the medication she gave him for his diarrhea, cholestyramine (QUESTRAN) 4 GM/DOSE powder, is not working as well as he thought it would.  He said he has taken half of the medication and still has 30 pouches left, which he said he is going to finish and then give the doctor a call to let her know the results.  Please advise and call back at [phone]

## 2019-01-15 NOTE — Telephone Encounter (Signed)
Called pt to get more information.  No answer.  LMFCB.

## 2019-01-25 ENCOUNTER — Telehealth: Payer: Self-pay | Admitting: Internal Medicine

## 2019-01-25 NOTE — Telephone Encounter (Signed)
Copied from [place] [phone]. Topic: Quick Communication - Rx Refill/Question >> Jan 25, 2019  5:51 PM Blase Mess A wrote: Medication: HYDROcodone-acetaminophen (NORCO/VICODIN) 5-325 MG tablet [[id number]]   Has the patient contacted their pharmacy? Yes  (Agent: If no, request that the patient contact the pharmacy for the refill.) (Agent: If yes, when and what did the pharmacy advise?)  Preferred Pharmacy (with phone number or street name): [facility] [id number], [place] [phone] (Phone) [phone] (Fax)    Agent: Please be advised that RX refills may take up to 3 business days. We ask that you follow-up with your pharmacy.

## 2019-01-25 NOTE — Telephone Encounter (Signed)
Copied from [place] [id number]. Topic: Quick Communication - See Telephone Encounter >> Jan 25, 2019 11:34 AM Loma [place] wrote: CRM for notification. See Telephone encounter for: 01/25/19. Pt called and states that he has been taking cholestyramine (QUESTRAN) 4 GM/DOSE powder for over a month and it is not working at all. He says he has dirraeha now as bad as he did a year and a half ago. He wants to try something else or to have Dr Derrel Nip reach out to him at [phone]

## 2019-01-25 NOTE — Telephone Encounter (Signed)
LMCTB

## 2019-01-25 NOTE — Telephone Encounter (Signed)
Pt returning your call. Please call back °

## 2019-01-26 MED ORDER — DIPHENOXYLATE-ATROPINE 2.5-0.025 MG PO TABS: 1.0000 | ORAL_TABLET | Freq: Two times a day (BID) | ORAL | 5 refills | Status: DC | PRN

## 2019-01-26 NOTE — Telephone Encounter (Signed)
Should I schedule pt for a telephone visit with you to discuss the diarrhea. Pt states that the Lucrezia Starch is not working any more and the diarrhea is back like it was over a year ago.

## 2019-01-26 NOTE — Telephone Encounter (Signed)
Spoke with pharmacist and she stated that the pt filled the rx yesterday and has one more fill of file.

## 2019-01-26 NOTE — Telephone Encounter (Signed)
Spoke with pt and informed him of the medication that Dr. Derrel Nip has prescribed him and that he can not take more than two tablets daily. Pt gave a verbal understanding.

## 2019-01-26 NOTE — Telephone Encounter (Signed)
Sending lomotil to pharmacy  Take not more than 2 tablet daily . The other medication I wanted to try (budesonide) is not covered by his insurance

## 2019-01-26 NOTE — Telephone Encounter (Signed)
Pt called back returning your call. Thank you! °

## 2019-02-03 ENCOUNTER — Other Ambulatory Visit: Payer: Self-pay

## 2019-02-03 ENCOUNTER — Telehealth: Payer: Self-pay

## 2019-02-03 DIAGNOSIS — K529 Noninfective gastroenteritis and colitis, unspecified: Secondary | ICD-10-CM

## 2019-02-03 MED ORDER — CILOSTAZOL 100 MG PO TABS: ORAL_TABLET | ORAL | 0 refills | Status: DC

## 2019-02-03 NOTE — Telephone Encounter (Signed)
If he is taking meloxicam daily,  He should take the pantoprazole daily.  Please confirm with patient

## 2019-02-03 NOTE — Telephone Encounter (Signed)
Pantoprazole is being requested for refill but this is not in the pt's current medication list.

## 2019-02-05 MED ORDER — PANTOPRAZOLE SODIUM 40 MG PO TBEC: DELAYED_RELEASE_TABLET | ORAL | 1 refills | Status: DC

## 2019-02-05 NOTE — Addendum Note (Signed)
Addended by: Adair Laundry on: 02/05/2019 03:26 PM   Modules accepted: Orders

## 2019-02-05 NOTE — Addendum Note (Signed)
Addended by: [name] on: 02/05/2019 04:11 PM   Modules accepted: Orders

## 2019-02-05 NOTE — Telephone Encounter (Signed)
Pt stated that he is taking the meloxicam daily, so I informed pt that he needs to make sure he is taking the pantoprazole daily. Pt stated that he has been and will continue. Medication has been refilled.   Pt also wanted to let you know that he is up the 3 pills daily of the Lomotil and the diarrhea still has not gotten any better. After reviewing his chart the pt is not supposed to be taking that medication more than twice daily but I was not able to get back in touch with the pt to let him know this. I left a message for him to call me back.

## 2019-02-05 NOTE — Telephone Encounter (Signed)
I am making a referral back to Dr Allen Norris,  There is nothing else I know to try for his diarrhea

## 2019-02-08 NOTE — Telephone Encounter (Signed)
Spoke with pt and informed him of the medications that he could try for the gas. Pt gave a verbal understanding.

## 2019-02-08 NOTE — Telephone Encounter (Signed)
Spoke with pt and informed him of the referral that was being placed. The pt stated that his stomach is now getting bloated and he can't get rid of the gas without having to go to the bathroom.

## 2019-02-08 NOTE — Telephone Encounter (Signed)
TELL HIM TO USE GAS X OR MYLANTA GAS FOR THE BLOATING

## 2019-02-09 ENCOUNTER — Other Ambulatory Visit: Payer: Self-pay | Admitting: Internal Medicine

## 2019-02-09 DIAGNOSIS — E1151 Type 2 diabetes mellitus with diabetic peripheral angiopathy without gangrene: Secondary | ICD-10-CM

## 2019-02-15 ENCOUNTER — Encounter: Payer: Self-pay | Admitting: *Deleted

## 2019-02-18 ENCOUNTER — Telehealth: Payer: Self-pay | Admitting: Internal Medicine

## 2019-02-18 ENCOUNTER — Other Ambulatory Visit: Payer: Self-pay

## 2019-02-18 MED ORDER — GLIPIZIDE 10 MG PO TABS: ORAL_TABLET | ORAL | 1 refills | Status: DC

## 2019-02-18 NOTE — Telephone Encounter (Signed)
Copied from [place] [phone]. Topic: Quick Communication - See Telephone Encounter >> Feb 18, 2019  1:40 PM Ivar Drape wrote: CRM for notification. See Telephone encounter for: 02/18/19. Patient would like to know his last A1C results and he also wanted the provider to know that the medication he was given for diarrhea is not working so he is going to stop taking it.

## 2019-02-18 NOTE — Telephone Encounter (Signed)
I have no other medications to treat his diarrhea with.  I  made a referral to [facility] that was placed on April 17th  If the lomotil is not working then he Wishek it

## 2019-02-18 NOTE — Telephone Encounter (Signed)
Called and  Informed the patient that he is to stop the lomotil and a referral was placed for a GI specialist and to wait until they reach out to him to be seen.  Pt understood. Sobia Karger,cma

## 2019-02-23 ENCOUNTER — Telehealth: Payer: Self-pay | Admitting: Internal Medicine

## 2019-02-23 NOTE — Telephone Encounter (Signed)
Copied from [place] [phone]. Topic: Quick Communication - Rx Refill/Question >> Feb 23, 2019  9:21 AM Reyne Dumas L wrote: Medication: HYDROcodone-acetaminophen (NORCO/VICODIN) 5-325 MG tablet  Has the patient contacted their pharmacy? Yes - needs new script (Agent: If no, request that the patient contact the pharmacy for the refill.) (Agent: If yes, when and what did the pharmacy advise?)  Preferred Pharmacy (with phone number or street name): [id number], [place] [phone] (Phone) [phone] (Fax)  Agent: Please be advised that RX refills may take up to 3 business days. We ask that you follow-up with your pharmacy.

## 2019-02-23 NOTE — Telephone Encounter (Signed)
Spoke with the pharmacy and they stated that they have a rx for this month that is being processed now. Pt is aware that there is one at the pharmacy to be picked up.

## 2019-03-02 ENCOUNTER — Other Ambulatory Visit: Payer: Self-pay | Admitting: Internal Medicine

## 2019-03-16 ENCOUNTER — Other Ambulatory Visit: Payer: Self-pay | Admitting: Internal Medicine

## 2019-03-17 ENCOUNTER — Other Ambulatory Visit: Payer: Self-pay

## 2019-03-17 MED ORDER — FERROUS FUM-IRON POLYSACCH 162-115.2 MG PO CAPS: ORAL_CAPSULE | ORAL | 1 refills | Status: DC

## 2019-03-17 MED ORDER — METOPROLOL SUCCINATE ER 100 MG PO TB24: ORAL_TABLET | ORAL | 1 refills | Status: DC

## 2019-03-17 MED ORDER — MELOXICAM 15 MG PO TABS: ORAL_TABLET | ORAL | 1 refills | Status: DC

## 2019-03-25 ENCOUNTER — Ambulatory Visit (INDEPENDENT_AMBULATORY_CARE_PROVIDER_SITE_OTHER): Payer: Medicare Other | Admitting: Internal Medicine

## 2019-03-25 ENCOUNTER — Encounter: Payer: Self-pay | Admitting: Internal Medicine

## 2019-03-25 ENCOUNTER — Other Ambulatory Visit: Payer: Self-pay

## 2019-03-25 DIAGNOSIS — R053 Chronic cough: Secondary | ICD-10-CM

## 2019-03-25 DIAGNOSIS — E1142 Type 2 diabetes mellitus with diabetic polyneuropathy: Secondary | ICD-10-CM

## 2019-03-25 DIAGNOSIS — E1165 Type 2 diabetes mellitus with hyperglycemia: Secondary | ICD-10-CM

## 2019-03-25 DIAGNOSIS — R05 Cough: Secondary | ICD-10-CM

## 2019-03-25 DIAGNOSIS — R058 Other specified cough: Secondary | ICD-10-CM

## 2019-03-25 DIAGNOSIS — IMO0002 Reserved for concepts with insufficient information to code with codable children: Secondary | ICD-10-CM

## 2019-03-25 DIAGNOSIS — E1151 Type 2 diabetes mellitus with diabetic peripheral angiopathy without gangrene: Secondary | ICD-10-CM | POA: Diagnosis not present

## 2019-03-25 DIAGNOSIS — Z794 Long term (current) use of insulin: Secondary | ICD-10-CM

## 2019-03-25 DIAGNOSIS — K529 Noninfective gastroenteritis and colitis, unspecified: Secondary | ICD-10-CM

## 2019-03-25 DIAGNOSIS — Z8673 Personal history of transient ischemic attack (TIA), and cerebral infarction without residual deficits: Secondary | ICD-10-CM

## 2019-03-25 MED ORDER — INSULIN LISPRO PROT & LISPRO (75-25 MIX) 100 UNIT/ML ~~LOC~~ SUSP: SUBCUTANEOUS | 5 refills | Status: DC

## 2019-03-25 NOTE — Progress Notes (Signed)
Telephone  Note  This visit type was conducted due to national recommendations for restrictions regarding the COVID-19 pandemic (e.g. social distancing).  This format is felt to be most appropriate for this patient at this time.  All issues noted in this document were discussed and addressed.  No physical exam was performed (except for noted visual exam findings with Video Visits).   I connected with@ on 03/25/19 at 10:00 AM EDT by  telephone and verified that I am speaking with the correct person using two identifiers. Location patient: home Location provider: work or home office Persons participating in the virtual visit: patient, provider  I discussed the limitations, risks, security and privacy concerns of performing an evaluation and management service by telephone and the availability of in person appointments. I also discussed with the patient that there may be a patient responsible charge related to this service. The patient expressed understanding and agreed to proceed.  Reason for visit: cough and congestion for 5 months  HPI:  Reports having productive cough since December.  He reports a morning cough productive of green tinged sputum. Also occurs less frequently during the day and at night.  Denies dypsnea body aches,  Wheezing and fevers .[name]  Has tried tylenol cold and sinus.  Has tried inhalers in the past that "never worked" . He has a history of tobacco abuse.  He denies sinus drainage.  He is demanding a  z pack to clear up because NP gave it to him 2 years ago and it work "happens every year "   Diarrhea persistent,  Despite trials of medication suspension ,  And trial of questran and lomotil  Gi referral for second opinion in progress  Sees [facility] in mid June.  3 loose stools daily,  Occasionally had a nighttime incontinent episode.  "oreos made it better"  DM:  Using mixed insulin  21 units in the am  and 27 units  qac dinner . [name]  Fastings rangin 100 to 143.  Post prandial and  prepreandial dinner readings are  193 to 219 . He has declined to see Eula Fried for diabetes management   ROS: See pertinent positives and negatives per HPI.  Past Medical History:  Diagnosis Date  . BPH (benign prostatic hyperplasia)   . Carotid artery stenosis 11/2010   <50% bilaterally  . COPD (chronic obstructive pulmonary disease) ([place])    noted CT 10/05/16 former smoker quit age 26   . CVA (cerebral infarction) 11/2010   r thalamic lacunar  . Diabetes mellitus   . GERD (gastroesophageal reflux disease)   . Hyperlipidemia   . Hypertension   . Neuromuscular disorder ([place])   . Peripheral vascular disease in diabetes mellitus ([place]) 06/2012    95% occlusion s/p PTCA R SFA Dew Sept 2013  . Stroke [facility])     Past Surgical History:  Procedure Laterality Date  . CHOLECYSTECTOMY N/A 11/13/2016   Procedure: LAPAROSCOPIC CHOLECYSTECTOMY WITH INTRAOPERATIVE CHOLANGIOGRAM;  Surgeon: Olean Ree, MD;  Location: [facility] ORS;  Service: General;  Laterality: N/A;  . COLONOSCOPY WITH PROPOFOL N/A 02/10/2018   Procedure: COLONOSCOPY WITH PROPOFOL;  Surgeon: Lucilla Lame, MD;  Location: [facility] ENDOSCOPY;  Service: Endoscopy;  Laterality: N/A;  . ERCP N/A 11/17/2016   Procedure: ENDOSCOPIC RETROGRADE CHOLANGIOPANCREATOGRAPHY (ERCP);  Surgeon: Irene Shipper, MD;  Location: [facility] ENDOSCOPY;  Service: Endoscopy;  Laterality: N/A;  . ERCP N/A 02/04/2017   Procedure: ENDOSCOPIC RETROGRADE CHOLANGIOPANCREATOGRAPHY (ERCP) Stent removal;  Surgeon: Lucilla Lame, MD;  Location: [facility]  ENDOSCOPY;  Service: Endoscopy;  Laterality: N/A;  . IR GENERIC HISTORICAL  10/06/2016   IR PERC CHOLECYSTOSTOMY 10/06/2016 Aletta Edouard, MD [facility]-INTERV RAD  . JOINT REPLACEMENT Left 2014   left knee  . UPPER GASTROINTESTINAL ENDOSCOPY    . WRIST SURGERY      Family History  Problem Relation Age of Onset  . Diabetes Mother     SOCIAL HX:  reports that he quit smoking about 50 years ago. His smoking use included cigarettes. He  started smoking about 83 years ago. He has a 16.50 pack-year smoking history. He has never used smokeless tobacco. He reports that he does not drink alcohol or use drugs.   Current Outpatient Medications:  .  albuterol (PROVENTIL HFA;VENTOLIN HFA) 108 (90 Base) MCG/ACT inhaler, Inhale 1-2 puffs into the lungs every 6 (six) hours as needed for wheezing or shortness of breath (cough)., Disp: 1 Inhaler, Rfl: 0 .  Alum & Mag Hydroxide-Simeth (MAGIC MOUTHWASH) SOLN, Take 5 mLs by mouth 3 (three) times daily as needed for mouth pain., Disp: , Rfl:  .  aspirin EC 325 MG tablet, Take 325 mg by mouth daily., Disp: , Rfl:  .  atorvastatin (LIPITOR) 40 MG tablet, Take 1 tablet (40 mg total) by mouth daily., Disp: 90 tablet, Rfl: 0 .  Blood Glucose Monitoring Suppl (ONE TOUCH ULTRA SYSTEM KIT) w/Device KIT, 1 kit by Does not apply route once. Use DX code E11.59, Disp: 1 each, Rfl: 0 .  Cholecalciferol (VITAMIN D3) 2000 units TABS, Take 1 tablet by mouth daily., Disp: , Rfl:  .  cilostazol (PLETAL) 100 MG tablet, TAKE 1 TABLET BY MOUTH TWICE DAILY, Disp: 180 tablet, Rfl: 0 .  Diphenhyd-Hydrocort-Nystatin (FIRST-DUKES MOUTHWASH) SUSP, Swish 5 cc in mouth as needed or ulcers  And spit out, Disp: 500 mL, Rfl: 1 .  diphenoxylate-atropine (LOMOTIL) 2.5-0.025 MG tablet, Take 1 tablet by mouth 2 (two) times daily as needed for diarrhea or loose stools., Disp: 60 tablet, Rfl: 5 .  fenofibrate micronized (LOFIBRA) 134 MG capsule, TAKE 1 CAPSULE BY MOUTH EVERY MORNING BEFORE BREAKFAST, Disp: 30 capsule, Rfl: 0 .  ferrous fumarate-iron polysaccharide complex (TANDEM) 162-115.2 MG CAPS capsule, TAKE 1 CAPSULE BY MOUTH EVERY DAY**TAKE WITH ORANGE JUICE OR VITAMIN CAPSULE**, Disp: 90 capsule, Rfl: 1 .  gabapentin (NEURONTIN) 400 MG capsule, TAKE 1 CAPSULE(400 MG) BY MOUTH THREE TIMES DAILY, Disp: 270 capsule, Rfl: 0 .  glipiZIDE (GLUCOTROL) 10 MG tablet, TAKE 1 TABLET(10 MG) BY MOUTH TWICE DAILY BEFORE A MEAL, Disp: 180  tablet, Rfl: 1 .  HYDROcodone-acetaminophen (NORCO/VICODIN) 5-325 MG tablet, Take 1 tablet by mouth 2 (two) times daily as needed for moderate pain (KNEE PAIN)., Disp: 60 tablet, Rfl: 0 .  HYDROcodone-acetaminophen (NORCO/VICODIN) 5-325 MG tablet, Take 1 tablet by mouth 2 (two) times daily as needed., Disp: 60 tablet, Rfl: 0 .  HYDROcodone-acetaminophen (NORCO/VICODIN) 5-325 MG tablet, Take 1 tablet by mouth 2 (two) times daily as needed. May refill on or after Nov 22 2012, Disp: 60 tablet, Rfl: 0 .  INS SYRINGE/NEEDLE 1CC/28G (B-D INSULIN SYRINGE 1CC/28G) 28G X 1/2" 1 ML MISC, USE TO ADMINISTER INSULIN DAILY, Disp: 100 each, Rfl: 0 .  insulin lispro protamine-lispro (HUMALOG MIX 75/25) (75-25) 100 UNIT/ML SUSP injection, INJECT 24 UNITS UNDER THE SKIN RIGHT BEFORE BREAKFAST AND 28 UNITS UNDER THE SKIN BEFORE DINNER, Disp: 20 mL, Rfl: 5 .  latanoprost (XALATAN) 0.005 % ophthalmic solution, INSTILL 1 DROP INTO BOTH EYES QD, Disp: , Rfl: 5 .  meloxicam (MOBIC) 15 MG tablet, TAKE 1 TABLET(15 MG) BY MOUTH DAILY, Disp: 30 tablet, Rfl: 1 .  metoprolol succinate (TOPROL-XL) 100 MG 24 hr tablet, TAKE 1 TABLET BY MOUTH EVERY DAY WITH OR IMMEDIATELY FOLLOWING A MEAL, Disp: 90 tablet, Rfl: 1 .  Multiple Vitamin (MULTIVITAMIN WITH MINERALS) TABS tablet, Take 1 tablet by mouth daily., Disp: , Rfl:  .  nitroGLYCERIN (NITROSTAT) 0.4 MG SL tablet, Place 1 tablet (0.4 mg total) under the tongue every 5 (five) minutes as needed for chest pain. MAXIMUM 3 TABLETS, Disp: 50 tablet, Rfl: 3 .  ONE TOUCH ULTRA TEST test strip, USE 1 STRIP TO TEST THREE TIMES DAILY AS DIRECTED, Disp: 100 each, Rfl: 0 .  ONETOUCH DELICA LANCETS 21J MISC, Use three times daily to check blood sugar., Disp: 100 each, Rfl: 11 .  pantoprazole (PROTONIX) 40 MG tablet, TAKE 1 TABLET(40 MG) BY MOUTH TWICE DAILY BEFORE A MEAL, Disp: 180 tablet, Rfl: 1 .  tamsulosin (FLOMAX) 0.4 MG CAPS capsule, TAKE 1 CAPSULE(0.4 MG) BY MOUTH DAILY, Disp: 90 capsule,  Rfl: 1 .  timolol (TIMOPTIC) 0.5 % ophthalmic solution, INT 1 GTT IN OU QD, Disp: , Rfl: 2  EXAM:  VITALS per patient if applicable:  GENERAL: alert, oriented, appears well and in no acute distress  HEENT: atraumatic, conjunttiva clear, no obvious abnormalities on inspection of external nose and ears  NECK: normal movements of the head and neck  LUNGS: on inspection no signs of respiratory distress, breathing rate appears normal, no obvious gross SOB, gasping or wheezing  CV: no obvious cyanosis  MS: moves all visible extremities without noticeable abnormality  PSYCH/NEURO: pleasant and cooperative, no obvious depression or anxiety, speech and thought processing grossly intact  ASSESSMENT AND PLAN:  Discussed the following assessment and plan:  Uncontrolled type 2 diabetes mellitus with diabetic polyneuropathy, with long-term current use of insulin (HCC) - Plan: Hemoglobin A1c, Comprehensive metabolic panel  Type 2 diabetes mellitus with diabetic peripheral angiopathy without gangrene, without long-term current use of insulin (HCC) - Plan: insulin lispro protamine-lispro (HUMALOG MIX 75/25) (75-25) 100 UNIT/ML SUSP injection  Persistent cough for 3 weeks or longer - Plan: DG Chest 2 View, CBC with Differential/Platelet, Sedimentation rate  History of CVA (cerebrovascular accident)  Chronic diarrhea  Recurrent productive cough  Chronic diarrhea Persistent despite trials of suspensions of pantoprazole, famotidine. Metformin,  And no improvement with trial of  Questran and lomotil.  Consider  Trial of Entocort given active colitis noted on prior colonoscopy by Dr Allen Norris  Recurrent productive cough Suspect chronic bronchitis vs bronchiectasis given history of tobacco abuse.  Chest x ray ordered ,. No antibiotics indicated.     I discussed the assessment and treatment plan with the patient. The patient was provided an opportunity to ask questions and all were answered. The  patient agreed with the plan and demonstrated an understanding of the instructions.   The patient was advised to call back or seek an in-person evaluation if the symptoms worsen or if the condition fails to improve as anticipated.  I provided 25 minutes of non-face-to-face time during this encounter.   [name], MD

## 2019-03-25 NOTE — Progress Notes (Signed)
Refills:  Dukes Mouth Wash, Hydrocodone, famotadine  Pt stated that he has had a cough and congestion since December 2019 that has not gone away. He stated that he has been coughing up green phlegm every morning since then. Pt stated that he would like to get an antibiotic to see if it will knock it out. Pt stated that he used a zpak once before and it got rid of the congestion.

## 2019-03-26 ENCOUNTER — Telehealth: Payer: Self-pay

## 2019-03-26 NOTE — Telephone Encounter (Signed)
Copied from [place] [phone]. Topic: General - Other >> [name] 26, 2019  1:09 PM Keene Breath wrote: Reason for CRM: Patient called to request a call from the nurse to schedule an appt.  Patient stated that the doctor told him that the nurse would call him yesterday to schedule the appt.  Please call patient at [phone]

## 2019-03-27 DIAGNOSIS — R05 Cough: Secondary | ICD-10-CM | POA: Insufficient documentation

## 2019-03-27 DIAGNOSIS — R058 Other specified cough: Secondary | ICD-10-CM | POA: Insufficient documentation

## 2019-03-27 NOTE — Assessment & Plan Note (Signed)
Suspect chronic bronchitis vs bronchiectasis given history of tobacco abuse.  Chest x ray ordered ,. No antibiotics indicated.

## 2019-03-27 NOTE — Assessment & Plan Note (Signed)
Persistent despite trials of suspensions of pantoprazole, famotidine. Metformin,  And no improvement with trial of  Questran and lomotil.  Consider  Trial of Entocort given active colitis noted on prior colonoscopy by Dr Allen Norris

## 2019-03-29 MED ORDER — HYDROCODONE-ACETAMINOPHEN 5-325 MG PO TABS: 1.0000 | ORAL_TABLET | Freq: Two times a day (BID) | ORAL | 0 refills | Status: DC | PRN

## 2019-03-29 NOTE — Telephone Encounter (Signed)
vicodin sent

## 2019-03-29 NOTE — Telephone Encounter (Signed)
Pt is requesting refill of Hydrocodone. Pt stated that "it was supposed to be filled on 03/25/2019."  Refilled: 02/22/2019 Last OV: 03/25/2019 Next OV: not scheduled

## 2019-03-29 NOTE — Telephone Encounter (Signed)
Pt calling upset that his HYDROcodone-acetaminophen (NORCO/VICODIN) 5-325 MG tablet Has not been filled.  Pt states it was due on 03/26/19. Pt states no one has called him to set up labs either.  Transferred pt to the office   [facility] [id number], [place] [phone] (Phone) [phone] (Fax)

## 2019-04-01 ENCOUNTER — Other Ambulatory Visit (INDEPENDENT_AMBULATORY_CARE_PROVIDER_SITE_OTHER): Payer: Medicare Other

## 2019-04-01 ENCOUNTER — Other Ambulatory Visit: Payer: Self-pay

## 2019-04-01 ENCOUNTER — Ambulatory Visit (INDEPENDENT_AMBULATORY_CARE_PROVIDER_SITE_OTHER): Payer: Medicare Other

## 2019-04-01 DIAGNOSIS — E1142 Type 2 diabetes mellitus with diabetic polyneuropathy: Secondary | ICD-10-CM | POA: Diagnosis not present

## 2019-04-01 DIAGNOSIS — R053 Chronic cough: Secondary | ICD-10-CM

## 2019-04-01 DIAGNOSIS — E1165 Type 2 diabetes mellitus with hyperglycemia: Secondary | ICD-10-CM

## 2019-04-01 DIAGNOSIS — IMO0002 Reserved for concepts with insufficient information to code with codable children: Secondary | ICD-10-CM

## 2019-04-01 DIAGNOSIS — Z794 Long term (current) use of insulin: Secondary | ICD-10-CM

## 2019-04-01 DIAGNOSIS — R05 Cough: Secondary | ICD-10-CM | POA: Diagnosis not present

## 2019-04-01 LAB — COMPREHENSIVE METABOLIC PANEL
ALT: 15 U/L (ref 0–53)
AST: 14 U/L (ref 0–37)
Albumin: 3.9 g/dL (ref 3.5–5.2)
Alkaline Phosphatase: 68 U/L (ref 39–117)
BUN: 12 mg/dL (ref 6–23)
CO2: 27 mEq/L (ref 19–32)
Calcium: 9 mg/dL (ref 8.4–10.5)
Chloride: 106 mEq/L (ref 96–112)
Creatinine, Ser: 1.02 mg/dL (ref 0.40–1.50)
GFR: 69.59 mL/min (ref >60.00)
Glucose, Bld: 68 mg/dL — ABNORMAL LOW (ref 70–99)
Potassium: 4 mEq/L (ref 3.5–5.1)
Sodium: 141 mEq/L (ref 135–145)
Total Bilirubin: 0.8 mg/dL (ref 0.2–1.2)
Total Protein: 6.1 g/dL (ref 6.0–8.3)

## 2019-04-01 LAB — CBC WITH DIFFERENTIAL/PLATELET
Basophils Absolute: 0 10*3/uL (ref 0.0–0.1)
Basophils Relative: 0.5 % (ref 0.0–3.0)
Eosinophils Absolute: 0.2 10*3/uL (ref 0.0–0.7)
Eosinophils Relative: 2.8 % (ref 0.0–5.0)
HCT: 41.2 % (ref 39.0–52.0)
Hemoglobin: 13.7 g/dL (ref 13.0–17.0)
Lymphocytes Relative: 30 % (ref 12.0–46.0)
Lymphs Abs: 1.9 10*3/uL (ref 0.7–4.0)
MCHC: 33.2 g/dL (ref 30.0–36.0)
MCV: 92.5 fl (ref 78.0–100.0)
Monocytes Absolute: 0.5 10*3/uL (ref 0.1–1.0)
Monocytes Relative: 7.3 % (ref 3.0–12.0)
Neutro Abs: 3.8 10*3/uL (ref 1.4–7.7)
Neutrophils Relative %: 59.4 % (ref 43.0–77.0)
Platelets: 229 10*3/uL (ref 150.0–400.0)
RBC: 4.46 Mil/uL (ref 4.22–5.81)
RDW: 14.7 % (ref 11.5–15.5)
WBC: 6.4 10*3/uL (ref 4.0–10.5)

## 2019-04-01 LAB — HEMOGLOBIN A1C: Hgb A1c MFr Bld: 8.2 % — ABNORMAL HIGH (ref 4.6–6.5)

## 2019-04-01 LAB — SEDIMENTATION RATE: Sed Rate: 11 mm/hr (ref 0–20)

## 2019-04-05 ENCOUNTER — Encounter: Payer: Self-pay | Admitting: Internal Medicine

## 2019-04-05 NOTE — Telephone Encounter (Signed)
I am terminating my relationship with Henry Lindsey due to disparaging comments that he made while having his blood drawn in our lab.  The comments were made to our phlebotomist and there were several patients nearby who heard the comments as well.

## 2019-04-06 ENCOUNTER — Other Ambulatory Visit: Payer: Self-pay

## 2019-04-06 ENCOUNTER — Ambulatory Visit (INDEPENDENT_AMBULATORY_CARE_PROVIDER_SITE_OTHER): Payer: Medicare Other | Admitting: Gastroenterology

## 2019-04-06 ENCOUNTER — Ambulatory Visit: Payer: Medicare Other | Admitting: Gastroenterology

## 2019-04-06 ENCOUNTER — Telehealth: Payer: Self-pay | Admitting: Internal Medicine

## 2019-04-06 ENCOUNTER — Telehealth: Payer: Self-pay

## 2019-04-06 ENCOUNTER — Encounter: Payer: Self-pay | Admitting: Gastroenterology

## 2019-04-06 VITALS — BP 180/76 | HR 80 | Temp 97.9°F | Ht 71.0 in | Wt 206.6 lb

## 2019-04-06 DIAGNOSIS — K529 Noninfective gastroenteritis and colitis, unspecified: Secondary | ICD-10-CM | POA: Diagnosis not present

## 2019-04-06 MED ORDER — ALOSETRON HCL 1 MG PO TABS: 1.0000 mg | ORAL_TABLET | Freq: Two times a day (BID) | ORAL | 3 refills | Status: DC

## 2019-04-06 NOTE — Telephone Encounter (Signed)
Copied from [place] [phone]. Topic: General - Other >> Apr 06, 2019  8:36 AM Celene Kras A wrote: Reason for CRM: Pt called and would like his lab results from 04/01/2019. Please advise.  I called pt and informed him that the lab results are not in and that Dr. Lupita Dawn cma will call him with results.  Dorthey Depace,cma

## 2019-04-06 NOTE — Progress Notes (Signed)
Primary Care Physician: [name], MD  Primary Gastroenterologist:  Dr. Lucilla Lame  Chief Complaint  Patient presents with  . Diarrhea    HPI: Henry Lindsey is a 85 y.o. male here for follow-up of diarrhea.  The patient had a colonoscopy with some acute colitis that was attributable to possible NSAID use versus near diverticular versus affect of the prep.  There were no signs of any chronic colitis.  That colonoscopy was done last year.  The patient has continued to have diarrhea and states that when he was given Lomotil it did not help.  The patient was also given cholestyramine which she reports has not made any change.  The patient was taken off his Pepcid without any change in his diarrhea.  He does report that when he takes a sleeping pill he will mess his bed but usually does not mess his bed on a regular basis.  There is no report of any unexplained weight loss.  His diarrhea is usually shortly after he eats.  He only has a few watery bowel movements a day and sometimes can wake up and have a normal bowel movement.  Current Outpatient Medications  Medication Sig Dispense Refill  . albuterol (PROVENTIL HFA;VENTOLIN HFA) 108 (90 Base) MCG/ACT inhaler Inhale 1-2 puffs into the lungs every 6 (six) hours as needed for wheezing or shortness of breath (cough). 1 Inhaler 0  . Alum & Mag Hydroxide-Simeth (MAGIC MOUTHWASH) SOLN Take 5 mLs by mouth 3 (three) times daily as needed for mouth pain.    [name] aspirin EC 325 MG tablet Take 325 mg by mouth daily.    [name] atorvastatin (LIPITOR) 40 MG tablet Take 1 tablet (40 mg total) by mouth daily. 90 tablet 0  . Blood Glucose Monitoring Suppl (ONE TOUCH ULTRA SYSTEM KIT) w/Device KIT 1 kit by Does not apply route once. Use DX code E11.59 1 each 0  . Cholecalciferol (VITAMIN D3) 2000 units TABS Take 1 tablet by mouth daily.    . cilostazol (PLETAL) 100 MG tablet TAKE 1 TABLET BY MOUTH TWICE DAILY 180 tablet 0  . Diphenhyd-Hydrocort-Nystatin  (FIRST-DUKES MOUTHWASH) SUSP Swish 5 cc in mouth as needed or ulcers  And spit out 500 mL 1  . alosetron (LOTRONEX) 1 MG tablet Take 1 tablet (1 mg total) by mouth 2 (two) times daily. 60 tablet 3  . diphenoxylate-atropine (LOMOTIL) 2.5-0.025 MG tablet Take 1 tablet by mouth 2 (two) times daily as needed for diarrhea or loose stools. 60 tablet 5  . fenofibrate micronized (LOFIBRA) 134 MG capsule TAKE 1 CAPSULE BY MOUTH EVERY MORNING BEFORE BREAKFAST 30 capsule 0  . ferrous fumarate-iron polysaccharide complex (TANDEM) 162-115.2 MG CAPS capsule TAKE 1 CAPSULE BY MOUTH EVERY DAY**TAKE WITH ORANGE JUICE OR VITAMIN CAPSULE** 90 capsule 1  . gabapentin (NEURONTIN) 400 MG capsule TAKE 1 CAPSULE(400 MG) BY MOUTH THREE TIMES DAILY 270 capsule 0  . glipiZIDE (GLUCOTROL) 10 MG tablet TAKE 1 TABLET(10 MG) BY MOUTH TWICE DAILY BEFORE A MEAL 180 tablet 1  . HYDROcodone-acetaminophen (NORCO/VICODIN) 5-325 MG tablet Take 1 tablet by mouth 2 (two) times daily as needed for moderate pain (KNEE PAIN). 60 tablet 0  . HYDROcodone-acetaminophen (NORCO/VICODIN) 5-325 MG tablet Take 1 tablet by mouth 2 (two) times daily as needed. 60 tablet 0  . HYDROcodone-acetaminophen (NORCO/VICODIN) 5-325 MG tablet Take 1 tablet by mouth 2 (two) times daily as needed. 60 tablet 0  . INS SYRINGE/NEEDLE 1CC/28G (B-D INSULIN SYRINGE 1CC/28G) 28G X  1/2" 1 ML MISC USE TO ADMINISTER INSULIN DAILY 100 each 0  . insulin lispro protamine-lispro (HUMALOG MIX 75/25) (75-25) 100 UNIT/ML SUSP injection INJECT 24 UNITS UNDER THE SKIN RIGHT BEFORE BREAKFAST AND 28 UNITS UNDER THE SKIN BEFORE DINNER 20 mL 5  . latanoprost (XALATAN) 0.005 % ophthalmic solution INSTILL 1 DROP INTO BOTH EYES QD  5  . meloxicam (MOBIC) 15 MG tablet TAKE 1 TABLET(15 MG) BY MOUTH DAILY 30 tablet 1  . metoprolol succinate (TOPROL-XL) 100 MG 24 hr tablet TAKE 1 TABLET BY MOUTH EVERY DAY WITH OR IMMEDIATELY FOLLOWING A MEAL 90 tablet 1  . Multiple Vitamin (MULTIVITAMIN WITH  MINERALS) TABS tablet Take 1 tablet by mouth daily.    . nitroGLYCERIN (NITROSTAT) 0.4 MG SL tablet Place 1 tablet (0.4 mg total) under the tongue every 5 (five) minutes as needed for chest pain. MAXIMUM 3 TABLETS 50 tablet 3  . ONE TOUCH ULTRA TEST test strip USE 1 STRIP TO TEST THREE TIMES DAILY AS DIRECTED 100 each 0  . ONETOUCH DELICA LANCETS 44B MISC Use three times daily to check blood sugar. 100 each 11  . pantoprazole (PROTONIX) 40 MG tablet TAKE 1 TABLET(40 MG) BY MOUTH TWICE DAILY BEFORE A MEAL 180 tablet 1  . tamsulosin (FLOMAX) 0.4 MG CAPS capsule TAKE 1 CAPSULE(0.4 MG) BY MOUTH DAILY 90 capsule 1  . timolol (TIMOPTIC) 0.5 % ophthalmic solution INT 1 GTT IN OU QD  2   No current facility-administered medications for this visit.     Allergies as of 04/06/2019 - Review Complete 04/06/2019  Allergen Reaction Noted  . Metformin and related Diarrhea 05/01/2018    ROS:  General: Negative for anorexia, weight loss, fever, chills, fatigue, weakness. ENT: Negative for hoarseness, difficulty swallowing , nasal congestion. CV: Negative for chest pain, angina, palpitations, dyspnea on exertion, peripheral edema.  Respiratory: Negative for dyspnea at rest, dyspnea on exertion, cough, sputum, wheezing.  GI: See history of present illness. GU:  Negative for dysuria, hematuria, urinary incontinence, urinary frequency, nocturnal urination.  Endo: Negative for unusual weight change.    Physical Examination:   BP (!) 180/76   Pulse 80   Temp 97.9 F (36.6 C) (Oral)   Ht '5\' 11"'  (1.803 m)   Wt 206 lb 9.6 oz (93.7 kg)   BMI 28.81 kg/m   General: Well-nourished, well-developed in no acute distress.  Eyes: No icterus. Conjunctivae pink. Extremities: No lower extremity edema. No clubbing or deformities. Neuro: Alert and oriented x 3.  Grossly intact. Skin: Warm and dry, no jaundice.   Psych: Alert and cooperative, normal mood and affect.  Labs:   Imaging Studies: Dg Chest 2 View   Result Date: 04/01/2019 CLINICAL DATA:  Productive cough for 6 months. EXAM: CHEST - 2 VIEW COMPARISON:  12/09/2017 FINDINGS: The heart is within normal limits in size for age and stable. Stable moderate tortuosity, ectasia and calcification of the thoracic aorta. The lungs are clear of an acute process. No infiltrates or effusions. Stable rounded density in the right lower lobe consistent with a benign calcified granuloma. The bony thorax is intact. IMPRESSION: No acute cardiopulmonary findings. Electronically Signed   By: Marijo Sanes M.D.   On: 04/01/2019 16:22    Assessment and Plan:   Henry Lindsey is a 85 y.o. y/o male who has a history of chronic diarrhea.  The patient reports his diarrhea started after he had gone from 3 pain pills down to 2 pain pills 2 years ago.  The patient had a colonoscopy without any obvious source of his diarrhea.  The patient has not had any weight loss fevers chills nausea or vomiting.  The patient's diarrhea was not helped with Lomotil.  The patient cannot be started on Viberzi since he has had his gallbladder out and therefore is at risk for pancreatitis on Viberzi.  He will be started on Lotronex 1 mg twice a day to see if this keeps his diarrhea under control.  If this does not control his diarrhea the patient may need to be switched to Imodium and titrate the Imodium to the desired effect.  The patient has been explained the plan and agrees with it.    Lucilla Lame, MD. Marval Regal   Note: This dictation was prepared with Dragon dictation along with smaller phrase technology. Any transcriptional errors that result from this process are unintentional.

## 2019-04-06 NOTE — Telephone Encounter (Signed)
Patient dismissed from [facility] by Deborra [name] MD, effective 04/05/19. Dismissal Letter sent out by 1st class mail. [employer]

## 2019-04-08 ENCOUNTER — Telehealth: Payer: Self-pay

## 2019-04-08 NOTE — Telephone Encounter (Signed)
FYI

## 2019-04-08 NOTE — Telephone Encounter (Signed)
Copied from [place] [id number]. Topic: General - Other >> Apr 07, 2019  4:48 PM Keene Breath wrote: Reason for CRM: Patient called to inform [name] that when he went to see Dr. Verl Blalock, he gave him a script for Alosetron to control his diarrhea.  Please advise and call patient back at [phone]

## 2019-04-19 ENCOUNTER — Other Ambulatory Visit: Payer: Self-pay

## 2019-04-19 ENCOUNTER — Telehealth: Payer: Self-pay | Admitting: Gastroenterology

## 2019-04-19 NOTE — Telephone Encounter (Signed)
Pt notified he can add Lomotil to the Lotronex but to slowly titrate up until he has a more formed stool. Advised him he can become constipated with adding this additional medication so he needs to stop the Lomotil if this happens. Pt verbalized understanding.

## 2019-04-19 NOTE — Telephone Encounter (Signed)
Yes but needs to strop if constipated.

## 2019-04-19 NOTE — Telephone Encounter (Signed)
Pt called stating the Lotronex 1 mg BID is helping but not as good as he thought. He is wondering if he could add either a Lomotil or Imodium with it.

## 2019-04-19 NOTE — Telephone Encounter (Signed)
Pt left vm he would  Like a call From Ginger to give her some information on the rx Dr.Wohl gave him

## 2019-04-27 ENCOUNTER — Other Ambulatory Visit: Payer: Self-pay | Admitting: Internal Medicine

## 2019-04-27 MED ORDER — HYDROCODONE-ACETAMINOPHEN 5-325 MG PO TABS: 1.0000 | ORAL_TABLET | Freq: Two times a day (BID) | ORAL | 0 refills | Status: DC | PRN

## 2019-04-29 ENCOUNTER — Other Ambulatory Visit: Payer: Self-pay | Admitting: Internal Medicine

## 2019-04-29 NOTE — Telephone Encounter (Signed)
Pt has changed doctor offices. Is it okay to refill for 30 days?

## 2019-05-06 ENCOUNTER — Telehealth: Payer: Self-pay | Admitting: Gastroenterology

## 2019-05-06 NOTE — Telephone Encounter (Signed)
Let the patient know that he has slight most everything that there is good diarrhea without a working on him.  I think he should come back into the office so we can talk about other options.

## 2019-05-06 NOTE — Telephone Encounter (Signed)
Patient called states the medication losetron (LOTRONEX) 1 MG tablet & the diphenoxylate-atropine (LOMOTIL) 2.5-0.025 MG (order by Dr Joaquin Courts longer his PCP)do not work. Please prescribe another medication to help him. He uses [facility]. His new PCP as of 05-28-19 DR Harrel Lemon @ [facility]

## 2019-05-11 ENCOUNTER — Telehealth (INDEPENDENT_AMBULATORY_CARE_PROVIDER_SITE_OTHER): Payer: Self-pay

## 2019-05-11 NOTE — Telephone Encounter (Signed)
Can you call pt and schedule a follow up appt. Let him know Dr. Allen Norris read his message and would like to see him back due to the medication not working and to discuss other options. Thank you!

## 2019-05-12 ENCOUNTER — Other Ambulatory Visit: Payer: Self-pay | Admitting: Vascular Surgery

## 2019-05-17 NOTE — Telephone Encounter (Signed)
Rx sent to pharmacy   

## 2019-06-11 DIAGNOSIS — G629 Polyneuropathy, unspecified: Secondary | ICD-10-CM | POA: Insufficient documentation

## 2019-06-11 DIAGNOSIS — K219 Gastro-esophageal reflux disease without esophagitis: Secondary | ICD-10-CM | POA: Insufficient documentation

## 2019-06-11 DIAGNOSIS — J449 Chronic obstructive pulmonary disease, unspecified: Secondary | ICD-10-CM | POA: Insufficient documentation

## 2019-06-22 ENCOUNTER — Other Ambulatory Visit: Payer: Self-pay

## 2019-06-22 ENCOUNTER — Encounter: Payer: Self-pay | Admitting: Gastroenterology

## 2019-06-22 ENCOUNTER — Ambulatory Visit (INDEPENDENT_AMBULATORY_CARE_PROVIDER_SITE_OTHER): Payer: Medicare Other | Admitting: Gastroenterology

## 2019-06-22 VITALS — BP 119/55 | HR 87 | Temp 98.2°F | Ht 71.0 in | Wt 193.8 lb

## 2019-06-22 DIAGNOSIS — K529 Noninfective gastroenteritis and colitis, unspecified: Secondary | ICD-10-CM

## 2019-06-22 MED ORDER — CHOLESTYRAMINE 4 GM/DOSE PO POWD: 4.0000 g | Freq: Three times a day (TID) | ORAL | 5 refills | Status: DC

## 2019-06-22 NOTE — Progress Notes (Signed)
Primary Care Physician: Baxter Hire, MD  Primary Gastroenterologist:  Dr. Lucilla Lame  Chief Complaint  Patient presents with  . Follow up diarrhea    HPI: Henry Lindsey is a 85 y.o. male here for follow-up of his diarrhea.  The patient was tried on Lotronex and states that it did not work and it was very expensive for him.  The patient was put on Lomotil and reports that it had helped him for most of the day.  The patient does report that he started having diarrhea after having his gallbladder out.  The patient denies any black stools or bloody stools.  He also denies any unexplained weight loss.  The patient had a colonoscopy in April 2019.  Current Outpatient Medications  Medication Sig Dispense Refill  . albuterol (PROVENTIL HFA;VENTOLIN HFA) 108 (90 Base) MCG/ACT inhaler Inhale 1-2 puffs into the lungs every 6 (six) hours as needed for wheezing or shortness of breath (cough). 1 Inhaler 0  . alosetron (LOTRONEX) 1 MG tablet Take 1 tablet (1 mg total) by mouth 2 (two) times daily. 60 tablet 3  . Alum & Mag Hydroxide-Simeth (MAGIC MOUTHWASH) SOLN Take 5 mLs by mouth 3 (three) times daily as needed for mouth pain.    [name] aspirin EC 325 MG tablet Take 325 mg by mouth daily.    [name] atorvastatin (LIPITOR) 40 MG tablet Take 1 tablet (40 mg total) by mouth daily. 90 tablet 0  . Blood Glucose Monitoring Suppl (ONE TOUCH ULTRA SYSTEM KIT) w/Device KIT 1 kit by Does not apply route once. Use DX code E11.59 1 each 0  . Cholecalciferol (VITAMIN D3) 2000 units TABS Take 1 tablet by mouth daily.    . cilostazol (PLETAL) 100 MG tablet TAKE 1 TABLET BY MOUTH TWICE DAILY 180 tablet 0  . Diphenhyd-Hydrocort-Nystatin (FIRST-DUKES MOUTHWASH) SUSP Swish 5 cc in mouth as needed or ulcers  And spit out 500 mL 1  . diphenoxylate-atropine (LOMOTIL) 2.5-0.025 MG tablet Take 1 tablet by mouth 2 (two) times daily as needed for diarrhea or loose stools. 60 tablet 5  . famotidine (PEPCID) 20 MG tablet TAKE 1  TABLET(20 MG) BY MOUTH TWICE DAILY 180 tablet 0  . fenofibrate micronized (LOFIBRA) 134 MG capsule TAKE 1 CAPSULE BY MOUTH EVERY MORNING BEFORE BREAKFAST 30 capsule 0  . ferrous fumarate-iron polysaccharide complex (TANDEM) 162-115.2 MG CAPS capsule TAKE 1 CAPSULE BY MOUTH EVERY DAY**TAKE WITH ORANGE JUICE OR VITAMIN CAPSULE** 90 capsule 1  . gabapentin (NEURONTIN) 400 MG capsule TAKE 1 CAPSULE(400 MG) BY MOUTH THREE TIMES DAILY 270 capsule 0  . glipiZIDE (GLUCOTROL) 10 MG tablet TAKE 1 TABLET(10 MG) BY MOUTH TWICE DAILY BEFORE A MEAL 180 tablet 1  . HYDROcodone-acetaminophen (NORCO/VICODIN) 5-325 MG tablet Take 1 tablet by mouth 2 (two) times daily as needed for moderate pain (KNEE PAIN). 60 tablet 0  . HYDROcodone-acetaminophen (NORCO/VICODIN) 5-325 MG tablet Take 1 tablet by mouth 2 (two) times daily as needed. 60 tablet 0  . HYDROcodone-acetaminophen (NORCO/VICODIN) 5-325 MG tablet Take 1 tablet by mouth 2 (two) times daily as needed. 60 tablet 0  . INS SYRINGE/NEEDLE 1CC/28G (B-D INSULIN SYRINGE 1CC/28G) 28G X 1/2" 1 ML MISC USE TO ADMINISTER INSULIN DAILY 100 each 0  . insulin lispro protamine-lispro (HUMALOG MIX 75/25) (75-25) 100 UNIT/ML SUSP injection INJECT 24 UNITS UNDER THE SKIN RIGHT BEFORE BREAKFAST AND 28 UNITS UNDER THE SKIN BEFORE DINNER 20 mL 5  . latanoprost (XALATAN) 0.005 % ophthalmic solution INSTILL 1  DROP INTO BOTH EYES QD  5  . meloxicam (MOBIC) 15 MG tablet TAKE 1 TABLET(15 MG) BY MOUTH DAILY 30 tablet 1  . metoprolol succinate (TOPROL-XL) 100 MG 24 hr tablet TAKE 1 TABLET BY MOUTH EVERY DAY WITH OR IMMEDIATELY FOLLOWING A MEAL 90 tablet 1  . Multiple Vitamin (MULTIVITAMIN WITH MINERALS) TABS tablet Take 1 tablet by mouth daily.    . nitroGLYCERIN (NITROSTAT) 0.4 MG SL tablet Place 1 tablet (0.4 mg total) under the tongue every 5 (five) minutes as needed for chest pain. MAXIMUM 3 TABLETS 50 tablet 3  . ONE TOUCH ULTRA TEST test strip USE 1 STRIP TO TEST THREE TIMES DAILY AS  DIRECTED 100 each 0  . ONETOUCH DELICA LANCETS 44B MISC Use three times daily to check blood sugar. 100 each 11  . pantoprazole (PROTONIX) 40 MG tablet TAKE 1 TABLET(40 MG) BY MOUTH TWICE DAILY BEFORE A MEAL 180 tablet 1  . tamsulosin (FLOMAX) 0.4 MG CAPS capsule TAKE 1 CAPSULE(0.4 MG) BY MOUTH DAILY 90 capsule 1  . timolol (TIMOPTIC) 0.5 % ophthalmic solution INT 1 GTT IN OU QD  2  . cholestyramine (QUESTRAN) 4 GM/DOSE powder Take 1 packet (4 g total) by mouth 3 (three) times daily with meals. 378 g 5   No current facility-administered medications for this visit.     Allergies as of 06/22/2019 - Review Complete 04/06/2019  Allergen Reaction Noted  . Metformin and related Diarrhea 05/01/2018    ROS:  General: Negative for anorexia, weight loss, fever, chills, fatigue, weakness. ENT: Negative for hoarseness, difficulty swallowing , nasal congestion. CV: Negative for chest pain, angina, palpitations, dyspnea on exertion, peripheral edema.  Respiratory: Negative for dyspnea at rest, dyspnea on exertion, cough, sputum, wheezing.  GI: See history of present illness. GU:  Negative for dysuria, hematuria, urinary incontinence, urinary frequency, nocturnal urination.  Endo: Negative for unusual weight change.    Physical Examination:   BP (!) 119/55   Pulse 87   Temp 98.2 F (36.8 C) (Oral)   Ht '5\' 11"'  (1.803 m)   Wt 193 lb 12.8 oz (87.9 kg)   BMI 27.03 kg/m   General: Well-nourished, well-developed in no acute distress.  Eyes: No icterus. Conjunctivae pink. Extremities: No lower extremity edema. No clubbing or deformities. Neuro: Alert and oriented x 3.  Grossly intact. Skin: Warm and dry, no jaundice.   Psych: Alert and cooperative, normal mood and affect.  Labs:    Imaging Studies: No results found.  Assessment and Plan:   Henry Lindsey is a 85 y.o. y/o male who continues to have diarrhea despite being tried on Lotronex.  The patient has been doing better on Lomotil and  he has been told that his dose can be increased.  Since the patient's symptoms started right around the time of his gallbladder being removed he will be tried on a trial of Questran.  If this does not work the patient has been told that he can increase the dose of his Lomotil.  The patient has been explained the plan and agrees with it.    Lucilla Lame, MD. Marval Regal   Note: This dictation was prepared with Dragon dictation along with smaller phrase technology. Any transcriptional errors that result from this process are unintentional.

## 2019-07-05 DIAGNOSIS — M5416 Radiculopathy, lumbar region: Secondary | ICD-10-CM | POA: Insufficient documentation

## 2019-10-01 ENCOUNTER — Encounter (INDEPENDENT_AMBULATORY_CARE_PROVIDER_SITE_OTHER): Payer: Medicare Other

## 2019-10-01 ENCOUNTER — Ambulatory Visit (INDEPENDENT_AMBULATORY_CARE_PROVIDER_SITE_OTHER): Payer: Medicare Other | Admitting: Nurse Practitioner

## 2019-11-04 ENCOUNTER — Ambulatory Visit: Payer: Self-pay

## 2019-12-15 ENCOUNTER — Other Ambulatory Visit: Payer: Self-pay

## 2019-12-15 ENCOUNTER — Ambulatory Visit (LOCAL_COMMUNITY_HEALTH_CENTER): Payer: Medicare Other

## 2019-12-15 DIAGNOSIS — Z719 Counseling, unspecified: Secondary | ICD-10-CM

## 2019-12-15 NOTE — Progress Notes (Signed)
86 year old male client presents to [facility] requesting pnuemonia vaccine (Pneumovax 23) because his doctor's office told him it would be free here. Counseled client that there is a charge for the vaccine.. Per [facility], client received PPV23 vaccine on 11/13/2011 and he has vaccine card indicating administration of PCV13 on 12/27/2013. Client states only medicine he takes is for diabetes and high blood pressure. Denies functional / anatomical asplenia and cochlear implant. Counseled client per standing orders that unable to administer vaccine as does not meet high risk criteria nd verified per telephone consult with Dr. Ernestina Patches. Client referred to PCP for vaccine. Rich Number, RN

## 2020-01-18 ENCOUNTER — Telehealth: Payer: Self-pay | Admitting: Cardiovascular Disease

## 2020-01-18 NOTE — Telephone Encounter (Signed)
   [facility] Pre-operative Risk Assessment    Request for surgical clearance:  1. What type of surgery is being performed? Rt TKA - traditional knee  2. When is this surgery scheduled? 02/09/20   3. What type of clearance is required (medical clearance vs. Pharmacy clearance to hold med vs. Both)? both  4. Are there any medications that need to be held prior to surgery and how long? Not listed, please advise if needed  5. Practice name and name of physician performing surgery? [facility], [facility], Dr Kurtis Bushman  6. What is your office phone number [phone] ext [phone]   7.   What is your office fax number [place]  8.   Anesthesia type (None, local, MAC, general) ? Local/spinal   Ace Gins 01/18/2020, 4:46 PM  _________________________________________________________________   (provider comments below)

## 2020-01-19 NOTE — Telephone Encounter (Signed)
Primary Cardiologist:No primary care provider on file.  Chart reviewed as part of pre-operative protocol coverage. Because of Henry Lindsey's past medical history and time since last visit, Henry Lindsey will require a follow-up visit in order to better assess preoperative cardiovascular risk. He has not been seen by Dr. Rockey Situ or cardiology since 2018.   Pre-op covering staff: - Please schedule appointment and call patient to inform them. - Please contact requesting surgeon's office via preferred method (i.e, phone, fax) to inform them of need for appointment prior to surgery.  If applicable, this message will also be routed to pharmacy pool and/or primary cardiologist for input on holding anticoagulant/antiplatelet agent as requested below so that this information is available at time of patient's appointment.   Henry Sims, NP  01/19/2020, 9:09 AM

## 2020-01-19 NOTE — Telephone Encounter (Signed)
Leave message for pt to call back 

## 2020-01-19 NOTE — Telephone Encounter (Signed)
Appointment schedule for April 7th @ 8:20 am with Dr Rockey Situ

## 2020-01-25 NOTE — Progress Notes (Signed)
Cardiology Office Note  Date:  01/26/2020   ID:  Henry, Lindsey 1933-09-10, MRN [id number]  PCP:  Baxter Hire, MD   Chief Complaint  Patient presents with  . OTHER    Cardiac clearance no complaints today. Meds reviewed verbally with pt.    HPI:  Henry Lindsey is a 86 y.o. male with a history of  hyperlipidemia,  peripheral vascular disease with  angioplasty of the distal right SFA by Dr.  Lucky Cowboy,  50% carotid disease bilaterally, history of poorly controlled diabetes,  history of stroke with minimal residual left sided deficits,  CT scan 10/05/2016 Moderate diffuse aortic atherosclerosis Moderate diffuse three-vessel coronary disease Prior smoker, stopped in 1969 Who presents for preop cardiovascular evaluation Last seen 10/2016  In follow-up today he reports that he is doing relatively well Reports he is scheduled for right total knee replacement in several months time This has been delayed secondary to poorly controlled diabetes Has had recent issues with his diabetes  cortisone shots into his hip and knee likely cause climbing numbers HBA1C 10, down to 9.4  At baseline active, lots of work in the yard Does [employer], works out Walking well, apart from his knee slowing him down No  Chest pain  Does lots of Weedeating, pulling grass, picking up sticks, mowing  For unclear reasons he is taking aspirin 325 mg x 2 daily with Pletal Reports he has been taking this regimen for years  EKG personally reviewed by myself on todays visit  NSR rate 18 , 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  Previous ultrasound showed bilateral tibial occlusive disease, residual 50% left SFA stenosis .   He does have a history of a  right thalamic lacunar infarct in February 2012.   PMH:   has a past medical history of BPH (benign prostatic hyperplasia), Carotid artery stenosis (11/2010), COPD (chronic obstructive pulmonary disease) ([place]), CVA (cerebral infarction) (11/2010), Diabetes mellitus, GERD (gastroesophageal reflux disease), Hyperlipidemia, Hypertension, Neuromuscular disorder ([place]), Peripheral vascular disease in diabetes mellitus ([place]) (06/2012), and Stroke ([place]).  PSH:    Past Surgical History:  Procedure Laterality Date  . CHOLECYSTECTOMY N/A 11/13/2016   Procedure: LAPAROSCOPIC CHOLECYSTECTOMY WITH INTRAOPERATIVE CHOLANGIOGRAM;  Surgeon: Olean Ree, MD;  Location: [facility] ORS;  Service: General;  Laterality: N/A;  . COLONOSCOPY WITH PROPOFOL N/A 02/10/2018   Procedure: COLONOSCOPY WITH PROPOFOL;  Surgeon: Lucilla Lame, MD;  Location: [facility] ENDOSCOPY;  Service: Endoscopy;  Laterality: N/A;  . ERCP N/A 11/17/2016   Procedure: ENDOSCOPIC RETROGRADE CHOLANGIOPANCREATOGRAPHY (ERCP);  Surgeon: Irene Shipper, MD;  Location: [facility] ENDOSCOPY;  Service: Endoscopy;  Laterality: N/A;  . ERCP N/A 02/04/2017   Procedure: ENDOSCOPIC RETROGRADE CHOLANGIOPANCREATOGRAPHY (ERCP) Stent removal;  Surgeon: Lucilla Lame, MD;  Location: [facility] ENDOSCOPY;  Service: Endoscopy;  Laterality: N/A;  . IR GENERIC HISTORICAL  10/06/2016   IR PERC CHOLECYSTOSTOMY 10/06/2016 Aletta Edouard, MD [facility]-INTER[facility] RAD  . JOINT REPLACEMENT Left 2014   left knee  . UPPER GASTROINTESTINAL ENDOSCOPY    . WRIST SURGERY      Current Outpatient Medications  Medication Sig Dispense Refill  . Ascorbic Acid (VITAMIN C PO) Take 1 tablet by mouth daily.    [name] aspirin EC 325 MG tablet Take 325 mg by mouth in the morning and  at bedtime.     [name] atorvastatin (LIPITOR) 40 MG tablet Take 1 tablet (40 mg total) by mouth daily. 90 tablet 0  . Blood Glucose Monitoring Suppl (ONE TOUCH ULTRA SYSTEM KIT) w/Device KIT 1 kit by Does not apply route once. Use DX code E11.59 1 each 0  .  cholecalciferol (VITAMIN D) 25 MCG (1000 UNIT) tablet Take 1,000 Units by mouth daily.    . cholestyramine (QUESTRAN) 4 g packet Take 1 packet by mouth in the morning and at bedtime.    . cilostazol (PLETAL) 100 MG tablet TAKE 1 TABLET BY MOUTH TWICE DAILY (Patient taking differently: Take 100 mg by mouth 2 (two) times daily. ) 180 tablet 0  . gabapentin (NEURONTIN) 400 MG capsule TAKE 1 CAPSULE(400 MG) BY MOUTH THREE TIMES DAILY (Patient taking differently: Take 400 mg by mouth 3 (three) times daily. ) 270 capsule 0  . glipiZIDE (GLUCOTROL) 10 MG tablet TAKE 1 TABLET(10 MG) BY MOUTH TWICE DAILY BEFORE A MEAL (Patient taking differently: Take 10 mg by mouth daily. ) 180 tablet 1  . HYDROcodone-acetaminophen (NORCO/VICODIN) 5-325 MG tablet Take 1 tablet by mouth 2 (two) times daily as needed for moderate pain (KNEE PAIN). 60 tablet 0  . INS SYRINGE/NEEDLE 1CC/28G (B-D INSULIN SYRINGE 1CC/28G) 28G X 1/2" 1 ML MISC USE TO ADMINISTER INSULIN DAILY 100 each 0  . insulin NPH-regular Human (70-30) 100 UNIT/ML injection Inject 30 Units into the skin in the morning and at bedtime.    [name] latanoprost (XALATAN) 0.005 % ophthalmic solution Place 1 drop into both eyes at bedtime.   5  . metoprolol succinate (TOPROL-XL) 100 MG 24 hr tablet TAKE 1 TABLET BY MOUTH EVERY DAY WITH OR IMMEDIATELY FOLLOWING A MEAL (Patient taking differently: Take 100 mg by mouth daily. TAKE 1 TABLET BY MOUTH EVERY DAY WITH OR IMMEDIATELY FOLLOWING A MEAL) 90 tablet 1  . nitroGLYCERIN (NITROSTAT) 0.4 MG SL tablet Place 1 tablet (0.4 mg total) under the tongue every 5 (five) minutes as needed for chest pain. MAXIMUM 3 TABLETS 50 tablet 3  . ONE TOUCH ULTRA TEST test strip USE 1 STRIP TO TEST THREE TIMES DAILY AS DIRECTED 100 each 0  . ONETOUCH DELICA LANCETS 12W MISC Use three times daily to check blood sugar. 100 each 11  . tamsulosin (FLOMAX) 0.4 MG CAPS capsule TAKE 1 CAPSULE(0.4 MG) BY MOUTH DAILY (Patient taking differently: Take 0.4 mg  by mouth daily. ) 90 capsule 1  . timolol (TIMOPTIC) 0.5 % ophthalmic solution Place 1 drop into both eyes daily.   2  . vitamin B-12 (CYANOCOBALAMIN) 500 MCG tablet Take 500 mcg by mouth daily.    . vitamin E 180 MG (400 UNITS) capsule Take 400 Units by mouth daily.     No current facility-administered medications for this visit.     Allergies:   Metformin and related   Social History:  The patient  reports that he quit smoking about 51 years ago. His smoking use included cigarettes. He started smoking about 84 years ago. He has a 16.50 pack-year smoking history. He has never used smokeless tobacco. He reports that he does not drink alcohol or use drugs.   Family History:   family history includes Diabetes in his mother.    Review of Systems: Review of Systems  Constitutional: Negative.   Respiratory: Negative.   Cardiovascular: Negative.   Gastrointestinal: Negative.   Musculoskeletal: Positive for joint pain.  Neurological: Negative.   Psychiatric/Behavioral: Negative.   All  other systems reviewed and are negative.   PHYSICAL EXAM: VS:  BP (!) 150/80 (BP Location: Left Arm, Patient Position: Sitting, Cuff Size: Normal)   Pulse 70   Ht _0  (1.803 m)   Wt 194 lb 6 oz (88.2 kg)   SpO2 90%   BMI 27.11 kg/m  , BMI Body mass index is 27.11 kg/m. GEN: Well nourished, well developed, in no acute distress  HEENT: normal  Neck: no JVD, carotid bruits, or masses Cardiac: RRR; no murmurs, rubs, or gallops,no edema  Respiratory:  clear to auscultation bilaterally, normal work of breathing GI: soft, nontender, nondistended, + BS, biliary drain in place MS: no deformity or atrophy  Skin: warm and dry, no rash Neuro:  Strength and sensation are intact Psych: euthymic mood, full affect   Recent Labs: 04/01/2019: ALT 15; BUN 12; Creatinine, Ser 1.02; Hemoglobin 13.7; Platelets 229.0; Potassium 4.0; Sodium 141    Lipid Panel Lab Results  Component Value Date   CHOL 107  12/24/2018   HDL 35.90 (L) 12/24/2018   LDLCALC 38 12/24/2018   TRIG 164.0 (H) 12/24/2018      Wt Readings from Last 3 Encounters:  01/26/20 194 lb 6 oz (88.2 kg)  06/22/19 193 lb 12.8 oz (87.9 kg)  04/06/19 206 lb 9.6 oz (93.7 kg)     ASSESSMENT AND PLAN:  Preoperative clearance - Plan: EKG 12-Lead Acceptable but moderate risk for upcoming procedure Denies anginal symptoms, able to achieve at least 4-5 METS with no anginal symptoms No indication at this time for further testing EKG unchanged,  Blood pressure mildly elevated but reports is well controlled at home No changes made  Mixed hyperlipidemia Cholesterol is at goal on the current lipid regimen. No changes to the medications were made.  Type 2 diabetes, uncontrolled, with peripheral circulatory disorder (HCC) Poorly controlled at this time, likely exacerbated by recent cortisone shots Now following a strict diet  PAD (peripheral artery disease) (HCC) Diffuse aortic atherosclerosis noted, moderate degree Recommend he follow-up with Dr. Lucky Cowboy for his yearly lower extremity arterial Doppler study --- Talk with Dr. Lucky Cowboy, you likely does not need to take aspirin 325 mg twice a day with Pletal, he has significant ecchymotic bruising on his arms and legs today, high risk of GI complications, no recent intervention  Carotid artery stenosis with cerebral infarction [facility]) Followed by Dr. Lucky Cowboy Stressed importance of aggressive diabetes control  Coronary artery disease involving native coronary artery of native heart without angina pectoris  coronary disease on CT scan, Denies anginal symptoms, no further testing at this time Good exercise tolerance   Total encounter time more than 25 minutes  Greater than 50% was spent in counseling and coordination of care with the patient   Disposition:   F/U  12 months   No orders of the defined types were placed in this encounter.    Signed, Esmond Plants, M.D., Ph.D. 01/26/2020  [place], [place]

## 2020-01-26 ENCOUNTER — Encounter: Payer: Self-pay | Admitting: Cardiovascular Disease

## 2020-01-26 ENCOUNTER — Other Ambulatory Visit: Payer: Self-pay

## 2020-01-26 ENCOUNTER — Ambulatory Visit (INDEPENDENT_AMBULATORY_CARE_PROVIDER_SITE_OTHER): Payer: Medicare Other | Admitting: Cardiovascular Disease

## 2020-01-26 VITALS — BP 145/75 | HR 70 | Ht 71.0 in | Wt 194.4 lb

## 2020-01-26 DIAGNOSIS — I63239 Cerebral infarction due to unspecified occlusion or stenosis of unspecified carotid arteries: Secondary | ICD-10-CM

## 2020-01-26 DIAGNOSIS — I25118 Atherosclerotic heart disease of native coronary artery with other forms of angina pectoris: Secondary | ICD-10-CM | POA: Diagnosis not present

## 2020-01-26 DIAGNOSIS — E782 Mixed hyperlipidemia: Secondary | ICD-10-CM | POA: Diagnosis not present

## 2020-01-26 DIAGNOSIS — I10 Essential (primary) hypertension: Secondary | ICD-10-CM

## 2020-01-26 DIAGNOSIS — I739 Peripheral vascular disease, unspecified: Secondary | ICD-10-CM

## 2020-01-26 NOTE — Patient Instructions (Addendum)
We will cal dr. Lucky Cowboy about the aspirin 325 twice a day This is a high dose Also on pletal   Medication Instructions:  No changes  If you need a refill on your cardiac medications before your next appointment, please call your pharmacy.    Lab work: No new labs needed   If you have labs (blood work) drawn today and your tests are completely normal, you will receive your results only by: [name] MyChart Message (if you have MyChart) OR . A paper copy in the mail If you have any lab test that is abnormal or we need to change your treatment, we will call you to review the results.   Testing/Procedures: No new testing needed   Follow-Up: At [facility], you and your health needs are our priority.  As part of our continuing mission to provide you with exceptional heart care, we have created designated Provider Care Teams.  These Care Teams include your primary Cardiologist (physician) and Advanced Practice Providers (APPs -  Physician Assistants and Nurse Practitioners) who all work together to provide you with the care you need, when you need it.  . You will need a follow up appointment in 12 months   . Providers on your designated Care Team:   . Murray Hodgkins, NP . Christell Faith, PA-C . Marrianne Mood, PA-C  Any Other Special Instructions Will Be Listed Below (If Applicable).  For educational health videos Log in to : [email or web address] Or : [email or web address], password : [email or web address]

## 2020-01-27 ENCOUNTER — Inpatient Hospital Stay: Admission: RE | Admit: 2020-01-27 | Payer: Medicare Other | Source: Ambulatory Visit

## 2020-01-30 NOTE — Progress Notes (Signed)
Can we drop him down to aspirin 81 mg daily with Pletal Approved by Dr. Lucky Cowboy Thx TG

## 2020-01-31 ENCOUNTER — Telehealth: Payer: Self-pay | Admitting: *Deleted

## 2020-01-31 MED ORDER — ASPIRIN EC 81 MG PO TBEC: 81.0000 mg | DELAYED_RELEASE_TABLET | Freq: Every day | ORAL | 3 refills | Status: DC

## 2020-01-31 NOTE — Telephone Encounter (Signed)
-----   Message from Minna Merritts, MD sent at 01/30/2020  9:37 AM EDT -----   ----- Message ----- From: Algernon Huxley, MD Sent: 01/26/2020   4:35 PM EDT To: Minna Merritts, MD  Yes, that is fine by me ----- Message ----- From: Minna Merritts, MD Sent: 01/26/2020  12:28 PM EDT To: Algernon Huxley, MD  This gentleman is taking aspirin 325 mg twice a day with Pletal No recent intervention Would you be okay with dropping him down to aspirin 81 mg daily with the Pletal He has lots of bruising arms and legs Thx TG

## 2020-01-31 NOTE — Telephone Encounter (Signed)
Spoke with patient and reviewed provider recommendations to decrease Aspirin down to 81 mg once daily. He was agreeable with this plan. He did want me to let Dr. Rockey Situ know that he lost his son, Chalino Mulla, on the 8th. He had fell and hit his head on a hutch and died instantly. He feels as if he died on the 7th because by the time they found him on the 8th rigor mortas had already set in. Provided emotional support and expressed our sympathies for his loss. He was appreciative for the call and verbalized understanding of decreasing the aspirin with no further questions at this time. Updated medication list and will forward to provider.

## 2020-02-07 ENCOUNTER — Other Ambulatory Visit: Payer: Medicare Other

## 2020-02-09 ENCOUNTER — Ambulatory Visit: Admit: 2020-02-09 | Payer: Medicare Other | Admitting: Orthopedic Surgery

## 2020-03-17 ENCOUNTER — Other Ambulatory Visit (INDEPENDENT_AMBULATORY_CARE_PROVIDER_SITE_OTHER): Payer: Self-pay | Admitting: Vascular Surgery

## 2020-03-17 DIAGNOSIS — I739 Peripheral vascular disease, unspecified: Secondary | ICD-10-CM

## 2020-03-17 DIAGNOSIS — I6523 Occlusion and stenosis of bilateral carotid arteries: Secondary | ICD-10-CM

## 2020-03-22 ENCOUNTER — Other Ambulatory Visit: Payer: Self-pay

## 2020-03-22 ENCOUNTER — Ambulatory Visit (INDEPENDENT_AMBULATORY_CARE_PROVIDER_SITE_OTHER): Payer: Medicare Other

## 2020-03-22 ENCOUNTER — Encounter (INDEPENDENT_AMBULATORY_CARE_PROVIDER_SITE_OTHER): Payer: Self-pay | Admitting: Nurse Practitioner

## 2020-03-22 ENCOUNTER — Ambulatory Visit (INDEPENDENT_AMBULATORY_CARE_PROVIDER_SITE_OTHER): Payer: Medicare Other | Admitting: Nurse Practitioner

## 2020-03-22 VITALS — BP 160/67 | HR 75 | Ht 71.0 in | Wt 188.0 lb

## 2020-03-22 DIAGNOSIS — E1151 Type 2 diabetes mellitus with diabetic peripheral angiopathy without gangrene: Secondary | ICD-10-CM | POA: Diagnosis not present

## 2020-03-22 DIAGNOSIS — E782 Mixed hyperlipidemia: Secondary | ICD-10-CM | POA: Diagnosis not present

## 2020-03-22 DIAGNOSIS — M722 Plantar fascial fibromatosis: Secondary | ICD-10-CM | POA: Insufficient documentation

## 2020-03-22 DIAGNOSIS — I63239 Cerebral infarction due to unspecified occlusion or stenosis of unspecified carotid arteries: Secondary | ICD-10-CM

## 2020-03-22 DIAGNOSIS — I739 Peripheral vascular disease, unspecified: Secondary | ICD-10-CM | POA: Diagnosis not present

## 2020-03-22 DIAGNOSIS — I6523 Occlusion and stenosis of bilateral carotid arteries: Secondary | ICD-10-CM | POA: Diagnosis not present

## 2020-03-22 DIAGNOSIS — I10 Essential (primary) hypertension: Secondary | ICD-10-CM

## 2020-03-27 ENCOUNTER — Encounter (INDEPENDENT_AMBULATORY_CARE_PROVIDER_SITE_OTHER): Payer: Self-pay | Admitting: Nurse Practitioner

## 2020-03-27 NOTE — Progress Notes (Signed)
Subjective:    Patient ID: Henry Lindsey, male    DOB: 1933-01-15, 86 y.o.   MRN: [id number] Chief Complaint  Patient presents with  . Follow-up    1 year f/U     The patient returns to the office for followup and review of the noninvasive studies. There have been no interval changes in lower extremity symptoms. No interval shortening of the patient's claudication distance or development of rest pain symptoms. No new ulcers or wounds have occurred since the last visit.  Typically the patient is on annual follow-up however due to Covid his follow-up has been delayed.  There have been no significant changes to the patient's overall health care.  The patient is taking enteric-coated aspirin 81 mg daily.  There is no history of migraine headaches. There is no history of seizures.  The patient denies amaurosis fugax or recent TIA symptoms. There are no recent neurological changes noted. The patient denies history of DVT, PE or superficial thrombophlebitis. The patient denies recent episodes of angina or shortness of breath.   ABI Rt=0.97 and Lt=1.06  (previous ABI's Rt=1.11 and Lt=1.17) Duplex ultrasound of the tibial arteries shows biphasic waveforms in the bilateral anterior tibial arteries with monophasic waveforms in the bilateral posterior tibial arteries.  The patient has strong toe waveforms bilaterally.  The patient is also seen for follow up evaluation of carotid stenosis. The carotid stenosis followed by ultrasound.      Carotid Duplex done today shows 40-59%.  No change compared to last study in 05/09/2017   Review of Systems  Musculoskeletal: Positive for arthralgias.  All other systems reviewed and are negative.      Objective:   Physical Exam Vitals reviewed.  HENT:     Head: Normocephalic.  Neck:     Vascular: No carotid bruit.  Cardiovascular:     Rate and Rhythm: Normal rate and regular rhythm.     Pulses: Normal pulses.     Heart sounds: Normal heart  sounds.  Pulmonary:     Effort: Pulmonary effort is normal.     Breath sounds: Normal breath sounds.  Neurological:     Mental Status: He is alert and oriented to person, place, and time.  Psychiatric:        Mood and Affect: Mood normal.        Behavior: Behavior normal.        Thought Content: Thought content normal.        Judgment: Judgment normal.     BP (!) 160/67   Pulse 75   Ht '5\' 11"'  (1.803 m)   Wt 188 lb (85.3 kg)   BMI 26.22 kg/m   Past Medical History:  Diagnosis Date  . BPH (benign prostatic hyperplasia)   . Carotid artery stenosis 11/2010   <50% bilaterally  . COPD (chronic obstructive pulmonary disease) ([place])    noted CT 10/05/16 former smoker quit age 43   . CVA (cerebral infarction) 11/2010   r thalamic lacunar  . Diabetes mellitus   . GERD (gastroesophageal reflux disease)   . Hyperlipidemia   . Hypertension   . Neuromuscular disorder ([place])   . Peripheral vascular disease in diabetes mellitus ([place]) 06/2012    95% occlusion s/p PTCA R SFA Dew Sept 2013  . Stroke [facility])     Social History   Socioeconomic History  . Marital status: Widowed    Spouse name: Enid Derry, deceased  . Number of children: 2  . Years of education: 40  .  Highest education level: Not on file  Occupational History    Employer: retired  Tobacco Use  . Smoking status: Former Smoker    Packs/day: 0.50    Years: 33.00    Pack years: 16.50    Types: Cigarettes    Start date: Jul 18, 1933    Quit date: 04/21/1968    Years since quitting: 51.9  . Smokeless tobacco: Never Used  Substance and Sexual Activity  . Alcohol use: No  . Drug use: No  . Sexual activity: Never    Partners: Female    Birth control/protection: None  Other Topics Concern  . Not on file  Social History Narrative   Widowed in 2014; dating again    Social Determinants of Health   Financial Resource Strain:   . Difficulty of Paying Living Expenses:   Food Insecurity:   . Worried About Charity fundraiser  in the Last Year:   . Arboriculturist in the Last Year:   Transportation Needs:   . Film/video editor (Medical):   [name] Lack of Transportation (Non-Medical):   Physical Activity:   . Days of Exercise per Week:   . Minutes of Exercise per Session:   Stress:   . Feeling of Stress :   Social Connections:   . Frequency of Communication with Friends and Family:   . Frequency of Social Gatherings with Friends and Family:   . Attends Religious Services:   . Active Member of Clubs or Organizations:   . Attends Archivist Meetings:   [name] Marital Status:   Intimate Partner Violence:   . Fear of Current or Ex-Partner:   . Emotionally Abused:   [name] Physically Abused:   . Sexually Abused:     Past Surgical History:  Procedure Laterality Date  . CHOLECYSTECTOMY N/A 11/13/2016   Procedure: LAPAROSCOPIC CHOLECYSTECTOMY WITH INTRAOPERATIVE CHOLANGIOGRAM;  Surgeon: Olean Ree, MD;  Location: [facility] ORS;  Service: General;  Laterality: N/A;  . COLONOSCOPY WITH PROPOFOL N/A 02/10/2018   Procedure: COLONOSCOPY WITH PROPOFOL;  Surgeon: Lucilla Lame, MD;  Location: [facility] ENDOSCOPY;  Service: Endoscopy;  Laterality: N/A;  . ERCP N/A 11/17/2016   Procedure: ENDOSCOPIC RETROGRADE CHOLANGIOPANCREATOGRAPHY (ERCP);  Surgeon: Irene Shipper, MD;  [facility] ENDOSCOPY;  Service: Endoscopy;  Laterality: N/A;  . ERCP N/A 02/04/2017   Procedure: ENDOSCOPIC RETROGRADE CHOLANGIOPANCREATOGRAPHY (ERCP) Stent removal;  Surgeon: Lucilla Lame, MD;  Location: [facility] ENDOSCOPY;  Service: Endoscopy;  Laterality: N/A;  . IR GENERIC HISTORICAL  10/06/2016   IR PERC CHOLECYSTOSTOMY 10/06/2016 Aletta Edouard, MD [facility]  . JOINT REPLACEMENT Left 2014   left knee  . UPPER GASTROINTESTINAL ENDOSCOPY    . WRIST SURGERY      Family History  Problem Relation Age of Onset  . Diabetes Mother     Allergies  Allergen Reactions  . Metformin And Related Diarrhea       Assessment & Plan:   1. Carotid artery  stenosis with cerebral infarction [facility]) Recommend:  Given the patient's asymptomatic subcritical stenosis no further invasive testing or surgery at this time.  Duplex ultrasound shows 40-59% stenosis bilaterally.  Continue antiplatelet therapy as prescribed Continue management of CAD, HTN and Hyperlipidemia Healthy heart diet,  encouraged exercise at least 4 times per week Follow up in 12 months with duplex ultrasound and physical exam   2. Peripheral vascular disease in diabetes mellitus ([place])  Recommend:  The patient has evidence of atherosclerosis of the lower extremities with claudication.  The patient  does not voice lifestyle limiting changes at this point in time.  Noninvasive studies do not suggest clinically significant change.  No invasive studies, angiography or surgery at this time The patient should continue walking and begin a more formal exercise program.  The patient should continue antiplatelet therapy and aggressive treatment of the lipid abnormalities  No changes in the patient's medications at this time  The patient should continue wearing graduated compression socks 10-15 mmHg strength to control the mild edema.   Patient will continue to follow-up on an annual basis, with noninvasive studies.  3. Essential hypertension Continue antihypertensive medications as already ordered, these medications have been reviewed and there are no changes at this time.   4. Mixed hyperlipidemia Continue statin as ordered and reviewed, no changes at this time    Current Outpatient Medications on File Prior to Visit  Medication Sig Dispense Refill  . Ascorbic Acid (VITAMIN C PO) Take 1 tablet by mouth daily.    [name] aspirin EC 81 MG tablet Take 1 tablet (81 mg total) by mouth daily. 90 tablet 3  . atorvastatin (LIPITOR) 40 MG tablet Take 1 tablet (40 mg total) by mouth daily. 90 tablet 0  . Blood Glucose Monitoring Suppl (ONE TOUCH ULTRA SYSTEM KIT) w/Device KIT 1 kit by Does  not apply route once. Use DX code E11.59 1 each 0  . cholecalciferol (VITAMIN D) 25 MCG (1000 UNIT) tablet Take 1,000 Units by mouth daily.    . cholestyramine (QUESTRAN) 4 g packet Take 1 packet by mouth in the morning and at bedtime.    . cilostazol (PLETAL) 100 MG tablet TAKE 1 TABLET BY MOUTH TWICE DAILY (Patient taking differently: Take 100 mg by mouth 2 (two) times daily. ) 180 tablet 0  . gabapentin (NEURONTIN) 400 MG capsule TAKE 1 CAPSULE(400 MG) BY MOUTH THREE TIMES DAILY (Patient taking differently: Take 400 mg by mouth 3 (three) times daily. ) 270 capsule 0  . glipiZIDE (GLUCOTROL) 10 MG tablet TAKE 1 TABLET(10 MG) BY MOUTH TWICE DAILY BEFORE A MEAL (Patient taking differently: Take 10 mg by mouth daily. ) 180 tablet 1  . HYDROcodone-acetaminophen (NORCO/VICODIN) 5-325 MG tablet Take 1 tablet by mouth 2 (two) times daily as needed for moderate pain (KNEE PAIN). 60 tablet 0  . INS SYRINGE/NEEDLE 1CC/28G (B-D INSULIN SYRINGE 1CC/28G) 28G X 1/2" 1 ML MISC USE TO ADMINISTER INSULIN DAILY 100 each 0  . insulin NPH-regular Human (70-30) 100 UNIT/ML injection Inject 30 Units into the skin in the morning and at bedtime.    [name] latanoprost (XALATAN) 0.005 % ophthalmic solution Place 1 drop into both eyes at bedtime.   5  . metoprolol succinate (TOPROL-XL) 100 MG 24 hr tablet TAKE 1 TABLET BY MOUTH EVERY DAY WITH OR IMMEDIATELY FOLLOWING A MEAL (Patient taking differently: Take 100 mg by mouth daily. TAKE 1 TABLET BY MOUTH EVERY DAY WITH OR IMMEDIATELY FOLLOWING A MEAL) 90 tablet 1  . nitroGLYCERIN (NITROSTAT) 0.4 MG SL tablet Place 1 tablet (0.4 mg total) under the tongue every 5 (five) minutes as needed for chest pain. MAXIMUM 3 TABLETS 50 tablet 3  . ONE TOUCH ULTRA TEST test strip USE 1 STRIP TO TEST THREE TIMES DAILY AS DIRECTED 100 each 0  . ONETOUCH DELICA LANCETS 15A MISC Use three times daily to check blood sugar. 100 each 11  . tamsulosin (FLOMAX) 0.4 MG CAPS capsule TAKE 1 CAPSULE(0.4 MG) BY  MOUTH DAILY (Patient taking differently: Take 0.4 mg by mouth daily. )  90 capsule 1  . timolol (TIMOPTIC) 0.5 % ophthalmic solution Place 1 drop into both eyes daily.   2  . vitamin B-12 (CYANOCOBALAMIN) 500 MCG tablet Take 500 mcg by mouth daily.    . vitamin E 180 MG (400 UNITS) capsule Take 400 Units by mouth daily.     No current facility-administered medications on file prior to visit.    There are no Patient Instructions on file for this visit. No follow-ups on file.   Kris Hartmann, NP

## 2020-03-31 ENCOUNTER — Inpatient Hospital Stay: Admission: RE | Admit: 2020-03-31 | Payer: Medicare Other | Source: Ambulatory Visit

## 2020-03-31 ENCOUNTER — Other Ambulatory Visit: Payer: Self-pay | Admitting: Orthopedic Surgery

## 2020-04-06 ENCOUNTER — Other Ambulatory Visit: Payer: Self-pay

## 2020-04-06 MED ORDER — CHOLESTYRAMINE 4 G PO PACK: 4.0000 g | PACK | Freq: Three times a day (TID) | ORAL | 12 refills | Status: DC

## 2020-04-07 ENCOUNTER — Other Ambulatory Visit: Admission: RE | Admit: 2020-04-07 | Payer: Medicare Other | Source: Ambulatory Visit

## 2020-04-10 ENCOUNTER — Other Ambulatory Visit: Payer: Medicare Other

## 2020-04-12 ENCOUNTER — Ambulatory Visit: Admission: RE | Admit: 2020-04-12 | Payer: Medicare Other | Source: Home / Self Care | Admitting: Orthopedic Surgery

## 2020-04-12 SURGERY — ARTHROPLASTY, KNEE, TOTAL
Anesthesia: Spinal | Site: Knee | Laterality: Right

## 2020-04-28 ENCOUNTER — Other Ambulatory Visit: Payer: Self-pay

## 2020-04-28 ENCOUNTER — Other Ambulatory Visit
Admission: RE | Admit: 2020-04-28 | Discharge: 2020-04-28 | Disposition: A | Discharge status: Home / Self Care | Payer: Medicare Other | Source: Ambulatory Visit | Attending: Orthopedic Surgery | Admitting: Orthopedic Surgery

## 2020-04-28 DIAGNOSIS — Z01812 Encounter for preprocedural laboratory examination: Secondary | ICD-10-CM | POA: Insufficient documentation

## 2020-04-28 NOTE — Patient Instructions (Signed)
Your procedure is scheduled on: Wednesday 05/10/20.  Report to DAY SURGERY DEPARTMENT LOCATED ON 2ND FLOOR MEDICAL MALL ENTRANCE. To find out your arrival time please call [phone] between 1PM - 3PM on Tuesday 05/09/20.   Remember: Instructions that are not followed completely may result in serious medical risk, up to and including death, or upon the discretion of your surgeon and anesthesiologist your surgery may need to be rescheduled.     __X__ 1. Do not eat food after midnight the night before your procedure.                 No gum chewing or hard candies. You may drink SUGAR FREE clear liquids up to 2 hours                 before you are scheduled to arrive for your surgery- DO NOT drink clear                 liquids within 2 hours of the start of your surgery.                   __X__2.  On the morning of surgery brush your teeth with toothpaste and water, you may rinse your mouth with mouthwash if you wish.  Do not swallow any toothpaste or mouthwash.     __X__ 3.  No Alcohol for 24 hours before or after surgery.   __X__ 4.  Do Not Smoke or use e-cigarettes For 24 Hours Prior to Your Surgery.                 Do not use any chewable tobacco products for at least 6 hours prior to                 surgery.  __X__5.  Notify your doctor if there is any change in your medical condition      (cold, fever, infections).      Do NOT wear jewelry, make-up, hairpins, clips or nail polish. Do NOT wear lotions, powders, or perfumes.  Do NOT shave 48 hours prior to surgery. Men may shave face and neck. Do NOT bring valuables to the hospital.     [facility] is not responsible for any belongings or valuables.   Contacts, dentures/partials or body piercings may not be worn into surgery. Bring a case for your contacts, glasses or hearing aids, a denture cup will be supplied.   Leave your suitcase in the car. After surgery it may be brought to your room.   For patients admitted to  the hospital, discharge time is determined by your treatment team.     __X__ Take ONLY these medicines the morning of surgery with A SIP OF WATER:     1. atorvastatin (LIPITOR)  2. gabapentin (NEURONTIN)   3. metoprolol succinate (TOPROL-XL)   4. pantoprazole (PROTONIX)  5. HYDROcodone-acetaminophen (NORCO/VICODIN) if needed       __X__ Use CHG Soap as directed   __X__ Take 1/2 of usual insulin dose the night before surgery. No insulin the morning of surgery.    __X__ Stop Blood Thinners Pletal (cilostazol) & Aspirin according to the instructions of your prescribing provider.   __X__ Stop Anti-inflammatories 7 days before surgery such as Advil, Ibuprofen, Motrin, BC or Goodies Powder, Naprosyn, Naproxen, Aleve, Aspirin, Meloxicam. May take Tylenol or if needed for pain or discomfort.    __X__Do not start taking any new herbal supplements or vitamins prior to your procedure.  __X__ Stop the following herbal supplements or vitamins: vitamin E  & ascorbic acid (VITAMIN C) on Wednesday 05/03/20.   Wear comfortable clothing (specific to your surgery type) to the hospital.  Plan for stool softeners for home use; pain medications have a tendency to cause constipation. You can also help prevent constipation by eating foods high in fiber such as fruits and vegetables and drinking plenty of fluids as your diet allows.  After surgery, you can prevent lung complications by doing breathing exercises.Take deep breaths and cough every 1-2 hours. Your doctor may order a device called an Incentive Spirometer to help you take deep breaths.  Please call the [place] Department at [phone] if you have any questions about these instructions

## 2020-05-08 ENCOUNTER — Inpatient Hospital Stay: Admission: RE | Admit: 2020-05-08 | Payer: Medicare Other | Source: Ambulatory Visit

## 2020-05-10 ENCOUNTER — Encounter: Admission: RE | Payer: Self-pay | Source: Home / Self Care

## 2020-05-10 ENCOUNTER — Ambulatory Visit: Admission: RE | Admit: 2020-05-10 | Payer: Medicare Other | Source: Home / Self Care | Admitting: Orthopedic Surgery

## 2020-05-10 SURGERY — ARTHROPLASTY, KNEE, TOTAL
Anesthesia: Spinal | Site: Knee | Laterality: Right

## 2020-05-29 DIAGNOSIS — S81809A Unspecified open wound, unspecified lower leg, initial encounter: Secondary | ICD-10-CM | POA: Insufficient documentation

## 2020-06-09 ENCOUNTER — Other Ambulatory Visit: Payer: Self-pay

## 2020-06-09 ENCOUNTER — Encounter
Admission: RE | Admit: 2020-06-09 | Discharge: 2020-06-09 | Disposition: A | Discharge status: Home / Self Care | Payer: Medicare Other | Source: Ambulatory Visit | Attending: Orthopedic Surgery | Admitting: Orthopedic Surgery

## 2020-06-09 DIAGNOSIS — Z01812 Encounter for preprocedural laboratory examination: Secondary | ICD-10-CM | POA: Diagnosis not present

## 2020-06-09 LAB — URINALYSIS, ROUTINE W REFLEX MICROSCOPIC
Bilirubin Urine: NEGATIVE
Glucose, UA: NEGATIVE mg/dL
Hgb urine dipstick: NEGATIVE
Ketones, ur: NEGATIVE mg/dL
Leukocytes,Ua: NEGATIVE
Nitrite: NEGATIVE
Protein, ur: NEGATIVE mg/dL
Specific Gravity, Urine: 1.013 (ref 1.005–1.030)
pH: 5 (ref 5.0–8.0)

## 2020-06-09 LAB — CBC
HCT: 42.3 % (ref 39.0–52.0)
Hemoglobin: 13.9 g/dL (ref 13.0–17.0)
MCH: 30.2 pg (ref 26.0–34.0)
MCHC: 32.9 g/dL (ref 30.0–36.0)
MCV: 92 fL (ref 80.0–100.0)
Platelets: 195 10*3/uL (ref 150–400)
RBC: 4.6 MIL/uL (ref 4.22–5.81)
RDW: 13.1 % (ref 11.5–15.5)
WBC: 5.3 10*3/uL (ref 4.0–10.5)
nRBC: 0 % (ref 0.0–0.2)

## 2020-06-09 LAB — BASIC METABOLIC PANEL
Anion gap: 9 (ref 5–15)
BUN: 25 mg/dL — ABNORMAL HIGH (ref 8–23)
CO2: 30 mmol/L (ref 22–32)
Calcium: 8.7 mg/dL — ABNORMAL LOW (ref 8.9–10.3)
Chloride: 100 mmol/L (ref 98–111)
Creatinine, Ser: 1.1 mg/dL (ref 0.61–1.24)
GFR calc Af Amer: >60 mL/min (ref >60)
GFR calc non Af Amer: >60 mL/min (ref >60)
Glucose, Bld: 134 mg/dL — ABNORMAL HIGH (ref 70–99)
Potassium: 4.5 mmol/L (ref 3.5–5.1)
Sodium: 139 mmol/L (ref 135–145)

## 2020-06-09 LAB — SURGICAL PCR SCREEN
MRSA, PCR: NEGATIVE
Staphylococcus aureus: POSITIVE — AB

## 2020-06-09 LAB — TYPE AND SCREEN
ABO/RH(D): O NEG
Antibody Screen: NEGATIVE

## 2020-06-09 LAB — PROTIME-INR
INR: 1 (ref 0.8–1.2)
Prothrombin Time: 13 seconds (ref 11.4–15.2)

## 2020-06-09 LAB — APTT: aPTT: 32 seconds (ref 24–36)

## 2020-06-09 NOTE — Progress Notes (Signed)
  [facility] Perioperative Services: Pre-Admission/Anesthesia Testing  Abnormal Lab Notification  Date: 06/09/20  Name: Henry Lindsey MRN:   [id number]  Re: Abnormal labs noted during PAT appointment  Provider Notified: Lovell Sheehan, MD Notification mode: Routed and/or faxed via [place] of concern: Lab Results  Component Value Date   STAPHAUREUS POSITIVE (A) 06/09/2020   MRSAPCR NEGATIVE 06/09/2020     Notes: Patient is scheduled for a TKA on 06/21/2020. This is a Community education officer; no formal response required.   Honor Loh, MSN, APRN, FNP-C, CEN [facility]  Peri-operative Services Nurse Practitioner Phone: [phone] 06/09/20 11:51 AM

## 2020-06-12 ENCOUNTER — Other Ambulatory Visit: Payer: Self-pay | Admitting: Orthopedic Surgery

## 2020-06-12 ENCOUNTER — Encounter: Payer: Self-pay | Admitting: Orthopedic Surgery

## 2020-06-12 NOTE — H&P (Signed)
SEIBERT KEETER MRN:  [id number] DOB/SEX:  03-03-1933/male  CHIEF COMPLAINT:  Painful right Knee  HISTORY: Patient is a 86 y.o. male presented with a history of pain in the right knee. Onset of symptoms was gradual starting several years ago with gradually worsening course since that time. Prior procedures on the knee include none. Patient has been treated conservatively with over-the-counter NSAIDs and activity modification. Patient currently rates pain in the knee at 10 out of 10 with activity. There is pain at night.  PAST MEDICAL HISTORY: Patient Active Problem List   Diagnosis Date Noted  . Plantar fasciitis 03/22/2020  . Lumbar radiculopathy 07/05/2019  . Chronic obstructive pulmonary disease ([place]) 06/11/2019  . Gastroesophageal reflux disease without esophagitis 06/11/2019  . Neuropathy 06/11/2019  . Recurrent productive cough 03/27/2019  . Chronic diarrhea   . Benign neoplasm of transverse colon   . Chronic iron deficiency anemia 10/09/2017  . History of diverticulitis 10/05/2017  . S/P laparoscopic cholecystectomy 12/05/2016  . Bile duct leak   . Chest pain 10/05/2016  . Pain in joint, ankle and foot 02/25/2015  . Diabetic neuropathy ([place]) 10/09/2014  . BPH with obstruction/lower urinary tract symptoms 10/09/2014  . Bursitis of hip, right 04/05/2014  . Glaucoma 04/05/2014  . Peripheral autonomic neuropathy due to DM ([place]) 12/28/2013  . Vitamin D deficiency 11/03/2013  . Incomplete emptying of bladder 08/04/2013  . Benign localized hyperplasia of prostate with urinary obstruction and other lower urinary tract symptoms (LUTS)(600.21) 06/24/2013  . Nocturia 06/24/2013  . Orchalgia 06/24/2013  . Impotence due to erectile dysfunction 06/01/2013  . Routine general medical examination at a health care facility 04/04/2013  . CAD (coronary artery disease) 07/21/2012  . Peripheral vascular disease in diabetes mellitus ([place])   . Carotid artery stenosis with cerebral infarction ([place])    . History of basal cell carcinoma excision 08/05/2011  . 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 Uncontrolled type 2 diabetes mellitus with diabetic polyneuropathy, with long-term current use of insulin ([place]) 08/05/2011  . Hyperlipidemia   . Hypertension   . History of CVA (cerebrovascular accident)    Past Medical History:  Diagnosis Date  . BPH (benign prostatic hyperplasia)   . Carotid artery stenosis 11/2010   <50% bilaterally  . COPD (chronic obstructive pulmonary disease) ([place])    noted CT 10/05/16 former smoker quit age 22   . CVA (cerebral infarction) 11/2010   r thalamic lacunar  . Diabetes mellitus   . GERD (gastroesophageal reflux disease)   . Hyperlipidemia   . Hypertension   . Neuromuscular disorder ([place])   . Peripheral vascular disease in diabetes mellitus ([place]) 06/2012    95% occlusion s/p PTCA R SFA Dew Sept 2013  . Stroke [facility])    Past Surgical History:  Procedure Laterality Date  . CHOLECYSTECTOMY N/A 11/13/2016   Procedure: LAPAROSCOPIC CHOLECYSTECTOMY WITH INTRAOPERATIVE CHOLANGIOGRAM;  Surgeon: Olean Ree, MD;  Location: [facility] ORS;  Service: General;  Laterality: N/A;  . COLONOSCOPY WITH PROPOFOL N/A 02/10/2018   Procedure: COLONOSCOPY WITH PROPOFOL;  Surgeon: Lucilla Lame, MD;  Location: [facility] ENDOSCOPY;  Service: Endoscopy;  Laterality: N/A;  . ERCP N/A 11/17/2016   Procedure: ENDOSCOPIC RETROGRADE CHOLANGIOPANCREATOGRAPHY (ERCP);  Surgeon: Irene Shipper, MD;  Location: [facility] ENDOSCOPY;  Service: Endoscopy;  Laterality: N/A;  . ERCP N/A 02/04/2017   Procedure: ENDOSCOPIC RETROGRADE CHOLANGIOPANCREATOGRAPHY (ERCP) Stent removal;  Surgeon: Lucilla Lame, MD;  Location: [facility] ENDOSCOPY;  Service: Endoscopy;  Laterality: N/A;  . IR GENERIC HISTORICAL  10/06/2016  IR PERC CHOLECYSTOSTOMY 10/06/2016 Aletta Edouard, MD [facility]-INTERV RAD  . JOINT REPLACEMENT Left 2014   left knee  . UPPER GASTROINTESTINAL ENDOSCOPY    . WRIST SURGERY       MEDICATIONS:  (Not in a hospital  admission)   ALLERGIES:   Allergies  Allergen Reactions  . Metformin And Related Diarrhea    REVIEW OF SYSTEMS:  Pertinent items noted in HPI and remainder of comprehensive ROS otherwise negative.   FAMILY HISTORY:   Family History  Problem Relation Age of Onset  . Diabetes Mother     SOCIAL HISTORY:   Social History   Tobacco Use  . Smoking status: Former Smoker    Packs/day: 0.50    Years: 33.00    Pack years: 16.50    Types: Cigarettes    Start date: 11/17/34    Quit date: 04/21/1968    Years since quitting: 52.1  . Smokeless tobacco: Never Used  Substance Use Topics  . Alcohol use: No     EXAMINATION:  Vital signs in last 24 hours: @VSRANGES @  General appearance: alert, cooperative and no distress Neck: no JVD, supple, symmetrical, trachea midline and thyroid not enlarged, symmetric, no tenderness/mass/nodules Lungs: clear to auscultation bilaterally Heart: regular rate and rhythm, S1, S2 normal, no murmur, click, rub or gallop Abdomen: soft, non-tender; bowel sounds normal; no masses,  no organomegaly Extremities: extremities normal, atraumatic, no cyanosis or edema and Homans sign is negative, no sign of DVT Pulses: 2+ and symmetric Skin: Skin color, texture, turgor normal. No rashes or lesions Neurologic: Alert and oriented X 3, normal strength and tone. Normal symmetric reflexes. Normal coordination and gait  Musculoskeletal:  ROM 0-90, Ligaments intact,  Imaging Review Plain radiographs demonstrate severe degenerative joint disease of the right knee. The overall alignment is significant varus. The bone quality appears to be excellent for age and reported activity level.  Assessment/Plan: Primary osteoarthritis, right knee   The patient history, physical examination and imaging studies are consistent with advanced degenerative joint disease of the right knee. The patient has failed conservative treatment.  The clearance notes were reviewed.  After  discussion with the patient it was felt that Total Knee Replacement was indicated. The procedure,  risks, and benefits of total knee arthroplasty were presented and reviewed. The risks including but not limited to aseptic loosening, infection, blood clots, vascular injury, stiffness, patella tracking problems complications among others were discussed. The patient acknowledged the explanation, agreed to proceed with the plan.  Carlynn Spry 06/12/2020, 2:23 PM

## 2020-06-14 ENCOUNTER — Ambulatory Visit: Payer: Medicare Other | Admitting: Internal Medicine

## 2020-06-19 ENCOUNTER — Other Ambulatory Visit
Admission: RE | Admit: 2020-06-19 | Discharge: 2020-06-19 | Disposition: A | Discharge status: Home / Self Care | Payer: Medicare Other | Source: Ambulatory Visit | Attending: Orthopedic Surgery | Admitting: Orthopedic Surgery

## 2020-06-21 ENCOUNTER — Ambulatory Visit: Admission: RE | Admit: 2020-06-21 | Payer: Medicare Other | Source: Home / Self Care | Admitting: Orthopedic Surgery

## 2020-06-21 ENCOUNTER — Encounter: Admission: RE | Payer: Self-pay | Source: Home / Self Care

## 2020-06-21 SURGERY — ARTHROPLASTY, KNEE, TOTAL
Anesthesia: Spinal | Site: Knee | Laterality: Right

## 2020-07-14 ENCOUNTER — Inpatient Hospital Stay: Admission: RE | Admit: 2020-07-14 | Payer: Medicare Other | Source: Ambulatory Visit

## 2020-07-19 ENCOUNTER — Telehealth: Payer: Self-pay | Admitting: Cardiovascular Disease

## 2020-07-19 NOTE — Telephone Encounter (Signed)
Pt c/o of Chest Pain: STAT if CP now or developed within 24 hours  1. Are you having CP right now? No   2. Are you experiencing any other symptoms (ex. SOB, nausea, vomiting, sweating)  Interim Numbness Arms   3. How long have you been experiencing CP? Unknown onset   4. Is your CP continuous or coming and going?  Interim   5. Have you taken Nitroglycerin? No    Patient says pain is under breast when at rest and radiates to back near shoulder blade when standing   Added on to 9/30 Henry Lindsey    ?

## 2020-07-19 NOTE — Telephone Encounter (Signed)
Spoke with patient and reviewed his symptoms. He states it hurts under right breast and reports both arms feel heavy and some pain that radiates to his back. Reviewed signs and symptoms that would require him to call 911 or go to local ED and he verbalized understanding. Confirmed appointment for tomorrow and requested that he arrive early to allow time to get here to office via [place] entrance. He verbalized understanding of our conversation, agreement with plan, and has no further questions at this time.

## 2020-07-20 ENCOUNTER — Ambulatory Visit (INDEPENDENT_AMBULATORY_CARE_PROVIDER_SITE_OTHER): Payer: Medicare Other | Admitting: Family

## 2020-07-20 ENCOUNTER — Other Ambulatory Visit: Payer: Self-pay

## 2020-07-20 ENCOUNTER — Encounter: Payer: Self-pay | Admitting: Family

## 2020-07-20 VITALS — BP 122/60 | HR 60 | Ht 71.0 in | Wt 195.0 lb

## 2020-07-20 DIAGNOSIS — I63239 Cerebral infarction due to unspecified occlusion or stenosis of unspecified carotid arteries: Secondary | ICD-10-CM | POA: Diagnosis not present

## 2020-07-20 DIAGNOSIS — I25118 Atherosclerotic heart disease of native coronary artery with other forms of angina pectoris: Secondary | ICD-10-CM

## 2020-07-20 DIAGNOSIS — I10 Essential (primary) hypertension: Secondary | ICD-10-CM

## 2020-07-20 DIAGNOSIS — R200 Anesthesia of skin: Secondary | ICD-10-CM

## 2020-07-20 DIAGNOSIS — I700 Atherosclerosis of aorta: Secondary | ICD-10-CM | POA: Diagnosis not present

## 2020-07-20 NOTE — Progress Notes (Signed)
Office Visit    Patient Name: Henry Lindsey Date of Encounter: 07/21/2020  Primary Care Provider:  Baxter Hire, MD Primary Cardiologist:  No primary care provider on file. Electrophysiologist:  None   Chief Complaint    Henry Lindsey is a 86 y.o. male with a hx of HLD, aortic atherosclerosis, coronary artery calcification on CT, prior tobacco use presents today for bilateral upper extremity numbness.  Past Medical History    Past Medical History:  Diagnosis Date  . BPH (benign prostatic hyperplasia)   . Carotid artery stenosis 11/2010   <50% bilaterally  . COPD (chronic obstructive pulmonary disease) ([place])    noted CT 10/05/16 former smoker quit age 63   . CVA (cerebral infarction) 11/2010   r thalamic lacunar  . Diabetes mellitus   . GERD (gastroesophageal reflux disease)   . Hyperlipidemia   . Hypertension   . Neuromuscular disorder ([place])   . Peripheral vascular disease in diabetes mellitus ([place]) 06/2012    95% occlusion s/p PTCA R SFA Dew Sept 2013  . Stroke [facility])    Past Surgical History:  Procedure Laterality Date  . CHOLECYSTECTOMY N/A 11/13/2016   Procedure: LAPAROSCOPIC CHOLECYSTECTOMY WITH INTRAOPERATIVE CHOLANGIOGRAM;  Surgeon: Olean Ree, MD;  Location: [facility] ORS;  Service: General;  Laterality: N/A;  . COLONOSCOPY WITH PROPOFOL N/A 02/10/2018   Procedure: COLONOSCOPY WITH PROPOFOL;  Surgeon: Lucilla Lame, MD;  Location: [facility] ENDOSCOPY;  Service: Endoscopy;  Laterality: N/A;  . ERCP N/A 11/17/2016   Procedure: ENDOSCOPIC RETROGRADE CHOLANGIOPANCREATOGRAPHY (ERCP);  Surgeon: Irene Shipper, MD;  Location: [facility] ENDOSCOPY;  Service: Endoscopy;  Laterality: N/A;  . ERCP N/A 02/04/2017   Procedure: ENDOSCOPIC RETROGRADE CHOLANGIOPANCREATOGRAPHY (ERCP) Stent removal;  Surgeon: Lucilla Lame, MD;  Location: [facility] ENDOSCOPY;  Service: Endoscopy;  Laterality: N/A;  . IR GENERIC HISTORICAL  10/06/2016   IR PERC CHOLECYSTOSTOMY 10/06/2016 Aletta Edouard, MD [facility]-INTERV RAD   . JOINT REPLACEMENT Left 2014   left knee  . UPPER GASTROINTESTINAL ENDOSCOPY    . WRIST SURGERY      Allergies  Allergies  Allergen Reactions  . Metformin And Related Diarrhea    History of Present Illness    Henry Lindsey is a 86 y.o. male with a hx of HLD, aortic atherosclerosis, coronary artery calcification on CT, prior tobacco use last seen 01/26/2020 by Dr. Rockey Situ.  CT scan December 2017 with moderate diffuse aortic atherosclerosis as well as moderate diffuse three-vessel coronary disease.  He was seen by Dr. Rockey Situ April 2021 for preoperative clearance for knee surgery and deemed acceptable risk for the planned procedure.  Presents today after calling the office noting bilateral upper extremity numbness.  Tells me back in May he picked up a heavy toolbox and pulled a muscle in his left arm.  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  Since that time he has noted intermittent numbness to his left hand.  However a few days ago he noted numbness in his right arm when laying on his right side.  This was resolved when he rolled over in the bed.  Reports no chest pain, pressure, tightness.  Reports no shortness of breath at rest nor dyspnea on exertion.  He is very active mowing both his and his girlfriend's yard with a push mower as well as splitting his own wood.  EKGs/Labs/Other Studies Reviewed:   The following studies were reviewed today:   EKG:  EKG is ordered today.  The ekg ordered today  demonstrates normal sinus rhythm 60 bpm with stable inferior ST/T wave changes.  Recent Labs: 06/09/2020: BUN 25; Creatinine, Ser 1.10; Hemoglobin 13.9; Platelets 195; Potassium 4.5; Sodium 139  Recent Lipid Panel    Component Value Date/Time   CHOL 107 12/24/2018 1146   TRIG 164.0 (H) 12/24/2018 1146   HDL 35.90 (L) 12/24/2018 1146   CHOLHDL 3 12/24/2018 1146   VLDL 32.8 12/24/2018 1146   LDLCALC 38 12/24/2018 1146   LDLDIRECT 62.0 10/04/2016 0942    Home  Medications   Current Meds  Medication Sig  . ascorbic acid (VITAMIN C) 500 MG tablet Take 500 mg by mouth daily.  [name] aspirin EC 81 MG tablet Take 1 tablet (81 mg total) by mouth daily.  [name] atorvastatin (LIPITOR) 40 MG tablet Take 1 tablet (40 mg total) by mouth daily.  . Blood Glucose Monitoring Suppl (ONE TOUCH ULTRA SYSTEM KIT) w/Device KIT 1 kit by Does not apply route once. Use DX code E11.59  . cholecalciferol (VITAMIN D) 25 MCG (1000 UNIT) tablet Take 1,000 Units by mouth daily.  . cholestyramine (QUESTRAN) 4 g packet Take 1 packet (4 g total) by mouth 3 (three) times daily. (Patient taking differently: Take 4 g by mouth 2 (two) times daily. )  . cilostazol (PLETAL) 100 MG tablet TAKE 1 TABLET BY MOUTH TWICE DAILY (Patient taking differently: Take 100 mg by mouth 2 (two) times daily. )  . diclofenac Sodium (VOLTAREN) 1 % GEL Apply 2 g topically 2 (two) times daily as needed (pain).  [name] gabapentin (NEURONTIN) 400 MG capsule TAKE 1 CAPSULE(400 MG) BY MOUTH THREE TIMES DAILY (Patient taking differently: Take 400 mg by mouth 3 (three) times daily. )  . glipiZIDE (GLUCOTROL) 10 MG tablet TAKE 1 TABLET(10 MG) BY MOUTH TWICE DAILY BEFORE A MEAL (Patient taking differently: Take 10 mg by mouth daily. )  . HYDROcodone-acetaminophen (NORCO/VICODIN) 5-325 MG tablet Take 1 tablet by mouth 2 (two) times daily as needed for moderate pain (KNEE PAIN). (Patient taking differently: Take 1 tablet by mouth in the morning and at bedtime. )  . INS SYRINGE/NEEDLE 1CC/28G (B-D INSULIN SYRINGE 1CC/28G) 28G X 1/2" 1 ML MISC USE TO ADMINISTER INSULIN DAILY  . insulin NPH-regular Human (70-30) 100 UNIT/ML injection Inject 23 Units into the skin in the morning and at bedtime.   [name] latanoprost (XALATAN) 0.005 % ophthalmic solution Place 2 drops into both eyes at bedtime.   . metoprolol succinate (TOPROL-XL) 100 MG 24 hr tablet TAKE 1 TABLET BY MOUTH EVERY DAY WITH OR IMMEDIATELY FOLLOWING A MEAL (Patient taking differently:  Take 100 mg by mouth daily. )  . Multiple Vitamin (MULTIVITAMIN WITH MINERALS) TABS tablet Take 1 tablet by mouth daily.  . nitroGLYCERIN (NITROSTAT) 0.4 MG SL tablet Place 1 tablet (0.4 mg total) under the tongue every 5 (five) minutes as needed for chest pain. MAXIMUM 3 TABLETS (Patient taking differently: Place 0.4 mg under the tongue every 5 (five) minutes as needed for chest pain. )  . ONE TOUCH ULTRA TEST test strip USE 1 STRIP TO TEST THREE TIMES DAILY AS DIRECTED  . ONETOUCH DELICA LANCETS 39Q MISC Use three times daily to check blood sugar.  . pantoprazole (PROTONIX) 40 MG tablet Take 40 mg by mouth daily.  . tamsulosin (FLOMAX) 0.4 MG CAPS capsule TAKE 1 CAPSULE(0.4 MG) BY MOUTH DAILY (Patient taking differently: Take 0.4 mg by mouth daily. )  . timolol (TIMOPTIC) 0.5 % ophthalmic solution Place 2 drops into both eyes daily.   [name]  vitamin B-12 (CYANOCOBALAMIN) 500 MCG tablet Take 500 mcg by mouth daily.   . vitamin E 180 MG (400 UNITS) capsule Take 400 Units by mouth daily.     Review of Systems  All other systems reviewed and are otherwise negative except as noted above.  Physical Exam    VS:  BP 122/60 (BP Location: Left Arm, Patient Position: Sitting, Cuff Size: Normal)   Pulse 60   Ht _0  (1.803 m)   Wt 195 lb (88.5 kg)   SpO2 96%   BMI 27.20 kg/m  , BMI Body mass index is 27.2 kg/m. GEN: Well nourished, well developed, in no acute distress. HEENT: normal. Neck: Supple, no JVD, carotid bruits, or masses. Cardiac: RRR, no murmurs, rubs, or gallops. No clubbing, cyanosis, edema.  Radials/DP/PT 2+ and equal bilaterally.  Respiratory:  Respirations regular and unlabored, clear to auscultation bilaterally. GI: Soft, nontender, nondistended, BS + x 4. MS: No deformity or atrophy. Skin: Warm and dry, no rash. Neuro:  Strength and sensation are intact. Psych: Normal affect.  Assessment & Plan    1. Aortic atherosclerosis/coronary artery calcification on CT scan -Reports  no anginal symptoms.  Exercise tolerance greater than 4 METS.  EKG today with stable inferior changes.  As he is without anginal symptoms, no indication for ischemic evaluation at this time.  He may proceed with knee surgery in October as planned.  Continue GDMT including beta-blocker, aspirin, statin. 2. PVD/Carotid artery stenosis - 03/2020 carotid duplex with bilateral 40-39% stenosis.  Continue to follow with Dr. Lucky Cowboy vascular services. 3. Bilateral arm numbness -anticipate this is due to his orthopedic injuries and very consistent with pinched nerve.  Encouraged to follow-up with primary care provider.  May benefit from scan of neck to ascertain any cervical injuries that could be causing symptoms.  Bilateral radial pulses 2+ with good capillary refill. 4. Hx of CVA -no recurrent symptoms suggestive of CVA.  Continue atorvastatin and aspirin.  Disposition: Follow up in 6 month(s) with Dr. Rockey Situ or APP.   Loel Dubonnet, NP 07/21/2020, 10:14 AM

## 2020-07-20 NOTE — Patient Instructions (Addendum)
Medication Instructions:   Your physician recommends that you continue on your current medications as directed. Please refer to the Current Medication list given to you today.  *If you need a refill on your cardiac medications before your next appointment, please call your pharmacy*   Lab Work:   NONE If you have labs (blood work) drawn today and your tests are completely normal, you will receive your results only by: [name] MyChart Message (if you have MyChart) OR . A paper copy in the mail If you have any lab test that is abnormal or we need to change your treatment, we will call you to review the results.   Testing/Procedures:  NONE   Follow-Up: At [facility], you and your health needs are our priority.  As part of our continuing mission to provide you with exceptional heart care, we have created designated Provider Care Teams.  These Care Teams include your primary Cardiologist (physician) and Advanced Practice Providers (APPs -  Physician Assistants and Nurse Practitioners) who all work together to provide you with the care you need, when you need it.  We recommend signing up for the patient portal called "MyChart".  Sign up information is provided on this After Visit Summary.  MyChart is used to connect with patients for Virtual Visits (Telemedicine).  Patients are able to view lab/test results, encounter notes, upcoming appointments, etc.  Non-urgent messages can be sent to your provider as well.   To learn more about what you can do with MyChart, go to [email or web address].    Your next appointment:    6 month(s)  The format for your next appointment:   In Person  Provider:   You may see Dr. Rockey Situ or one of the following Advanced Practice Providers on your designated Care Team:    Murray Hodgkins, NP  Christell Faith, PA-C  Marrianne Mood, PA-C  Cadence Ravenwood, [place]

## 2020-07-24 ENCOUNTER — Other Ambulatory Visit: Payer: Medicare Other

## 2020-07-26 ENCOUNTER — Encounter: Admission: RE | Payer: Self-pay | Source: Home / Self Care

## 2020-07-26 ENCOUNTER — Ambulatory Visit: Admission: RE | Admit: 2020-07-26 | Payer: Medicare Other | Source: Home / Self Care | Admitting: Orthopedic Surgery

## 2020-07-26 SURGERY — ARTHROPLASTY, KNEE, TOTAL
Anesthesia: Spinal | Site: Knee | Laterality: Right

## 2020-07-27 ENCOUNTER — Inpatient Hospital Stay: Admission: RE | Admit: 2020-07-27 | Payer: Medicare Other | Source: Ambulatory Visit

## 2020-08-03 ENCOUNTER — Other Ambulatory Visit: Payer: Medicare Other

## 2020-08-03 ENCOUNTER — Other Ambulatory Visit: Payer: Self-pay

## 2020-08-03 ENCOUNTER — Other Ambulatory Visit: Payer: Self-pay | Admitting: Orthopedic Surgery

## 2020-08-03 ENCOUNTER — Encounter
Admission: RE | Admit: 2020-08-03 | Discharge: 2020-08-03 | Disposition: A | Discharge status: Home / Self Care | Payer: Medicare Other | Source: Ambulatory Visit | Attending: Orthopedic Surgery | Admitting: Orthopedic Surgery

## 2020-08-03 ENCOUNTER — Encounter: Payer: Self-pay | Admitting: Orthopedic Surgery

## 2020-08-03 DIAGNOSIS — Z01812 Encounter for preprocedural laboratory examination: Secondary | ICD-10-CM | POA: Diagnosis not present

## 2020-08-03 DIAGNOSIS — Z20822 Contact with and (suspected) exposure to covid-19: Secondary | ICD-10-CM | POA: Insufficient documentation

## 2020-08-03 HISTORY — DX: Unspecified osteoarthritis, unspecified site: M19.90

## 2020-08-03 LAB — URINALYSIS, ROUTINE W REFLEX MICROSCOPIC
Bacteria, UA: NONE SEEN
Bilirubin Urine: NEGATIVE
Glucose, UA: NEGATIVE mg/dL
Ketones, ur: NEGATIVE mg/dL
Leukocytes,Ua: NEGATIVE
Nitrite: NEGATIVE
Protein, ur: NEGATIVE mg/dL
Specific Gravity, Urine: 1.013 (ref 1.005–1.030)
pH: 5 (ref 5.0–8.0)

## 2020-08-03 LAB — CBC
HCT: 44.4 % (ref 39.0–52.0)
Hemoglobin: 14.4 g/dL (ref 13.0–17.0)
MCH: 29.9 pg (ref 26.0–34.0)
MCHC: 32.4 g/dL (ref 30.0–36.0)
MCV: 92.1 fL (ref 80.0–100.0)
Platelets: 214 10*3/uL (ref 150–400)
RBC: 4.82 MIL/uL (ref 4.22–5.81)
RDW: 12.8 % (ref 11.5–15.5)
WBC: 8.7 10*3/uL (ref 4.0–10.5)
nRBC: 0 % (ref 0.0–0.2)

## 2020-08-03 LAB — SURGICAL PCR SCREEN
MRSA, PCR: NEGATIVE
Staphylococcus aureus: POSITIVE — AB

## 2020-08-03 LAB — BASIC METABOLIC PANEL
Anion gap: 9 (ref 5–15)
BUN: 22 mg/dL (ref 8–23)
CO2: 28 mmol/L (ref 22–32)
Calcium: 8.9 mg/dL (ref 8.9–10.3)
Chloride: 100 mmol/L (ref 98–111)
Creatinine, Ser: 0.94 mg/dL (ref 0.61–1.24)
GFR, Estimated: >60 mL/min (ref >60)
Glucose, Bld: 54 mg/dL — ABNORMAL LOW (ref 70–99)
Potassium: 3.6 mmol/L (ref 3.5–5.1)
Sodium: 137 mmol/L (ref 135–145)

## 2020-08-03 LAB — PROTIME-INR
INR: 1 (ref 0.8–1.2)
Prothrombin Time: 13.2 seconds (ref 11.4–15.2)

## 2020-08-03 LAB — TYPE AND SCREEN
ABO/RH(D): O NEG
Antibody Screen: NEGATIVE

## 2020-08-03 LAB — SARS CORONAVIRUS 2 (TAT 6-24 HRS): SARS Coronavirus 2: NEGATIVE

## 2020-08-03 LAB — APTT: aPTT: 33 seconds (ref 24–36)

## 2020-08-03 NOTE — H&P (Signed)
Henry Lindsey MRN:  [id number] DOB/SEX:  09-08-1933/male  CHIEF COMPLAINT:  Painful right Knee  HISTORY: Patient is a 86 y.o. male presented with a history of pain in the right knee. Onset of symptoms was gradual starting several years ago with gradually worsening course since that time. Prior procedures on the knee include none. Patient has been treated conservatively with over-the-counter NSAIDs and activity modification. Patient currently rates pain in the knee at 10 out of 10 with activity. There is pain at night.  PAST MEDICAL HISTORY: Patient Active Problem List   Diagnosis Date Noted   Plantar fasciitis 03/22/2020   Lumbar radiculopathy 07/05/2019   Chronic obstructive pulmonary disease ([place]) 06/11/2019   Gastroesophageal reflux disease without esophagitis 06/11/2019   Neuropathy 06/11/2019   Recurrent productive cough 03/27/2019   Chronic diarrhea    Benign neoplasm of transverse colon    Chronic iron deficiency anemia 10/09/2017   History of diverticulitis 10/05/2017   S/P laparoscopic cholecystectomy 12/05/2016   Bile duct leak    Chest pain 10/05/2016   Pain in joint, ankle and foot 02/25/2015   Diabetic neuropathy ([place]) 10/09/2014   BPH with obstruction/lower urinary tract symptoms 10/09/2014   Bursitis of hip, right 04/05/2014   Glaucoma 04/05/2014   Peripheral autonomic neuropathy due to DM ([place]) 12/28/2013   Vitamin D deficiency 11/03/2013   Incomplete emptying of bladder 08/04/2013   Benign localized hyperplasia of prostate with urinary obstruction and other lower urinary tract symptoms (LUTS)(600.21) 06/24/2013   Nocturia 06/24/2013   Orchalgia 06/24/2013   Impotence due to erectile dysfunction 06/01/2013   Routine general medical examination at a health care facility 04/04/2013   CAD (coronary artery disease) 07/21/2012   Peripheral vascular disease in diabetes mellitus ([place])    Carotid artery stenosis with cerebral infarction ([place])     History of basal cell carcinoma excision 08/05/2011   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/05/2011   Uncontrolled type 2 diabetes mellitus with diabetic polyneuropathy, with long-term current use of insulin ([place]) 08/05/2011   Hyperlipidemia    Hypertension    History of CVA (cerebrovascular accident)    Past Medical History:  Diagnosis Date   Arthritis    BPH (benign prostatic hyperplasia)    Carotid artery stenosis 11/2010   <50% bilaterally   COPD (chronic obstructive pulmonary disease) ([place])    noted CT 10/05/16 former smoker quit age 10    CVA (cerebral infarction) 11/2010   r thalamic lacunar   Diabetes mellitus    GERD (gastroesophageal reflux disease)    Hyperlipidemia    Hypertension    Neuromuscular disorder ([place])    Peripheral vascular disease in diabetes mellitus ([place]) 06/2012    95% occlusion s/p PTCA R SFA [place] Sept 2013   Stroke [facility])    Past Surgical History:  Procedure Laterality Date   CHOLECYSTECTOMY N/A 11/13/2016   Procedure: LAPAROSCOPIC CHOLECYSTECTOMY WITH INTRAOPERATIVE CHOLANGIOGRAM;  Surgeon: Olean Ree, MD;  Location: [facility] ORS;  Service: General;  Laterality: N/A;   COLONOSCOPY WITH PROPOFOL N/A 02/10/2018   Procedure: COLONOSCOPY WITH PROPOFOL;  Surgeon: Lucilla Lame, MD;  Location: [facility] ENDOSCOPY;  Service: Endoscopy;  Laterality: N/A;   ERCP N/A 11/17/2016   Procedure: ENDOSCOPIC RETROGRADE CHOLANGIOPANCREATOGRAPHY (ERCP);  Surgeon: Irene Shipper, MD;  Location: [facility] ENDOSCOPY;  Service: Endoscopy;  Laterality: N/A;   ERCP N/A 02/04/2017   Procedure: ENDOSCOPIC RETROGRADE CHOLANGIOPANCREATOGRAPHY (ERCP) Stent removal;  Surgeon: Lucilla Lame, MD;  Location: [facility] ENDOSCOPY;  Service: Endoscopy;  Laterality: N/A;   IR GENERIC HISTORICAL  10/06/2016   IR PERC CHOLECYSTOSTOMY 10/06/2016 Aletta Edouard, MD [facility]-INTERV RAD   JOINT REPLACEMENT Left 2014   left knee   UPPER GASTROINTESTINAL ENDOSCOPY     WRIST SURGERY       MEDICATIONS:  (Not in a  hospital admission)   ALLERGIES:   Allergies  Allergen Reactions   Metformin And Related Diarrhea    REVIEW OF SYSTEMS:  Pertinent items are noted in HPI.   FAMILY HISTORY:   Family History  Problem Relation Age of Onset   Diabetes Mother     SOCIAL HISTORY:   Social History   Tobacco Use   Smoking status: Former Smoker    Packs/day: 0.50    Years: 33.00    Pack years: 16.50    Types: Cigarettes    Start date: 1935/04/22    Quit date: 04/21/1968    Years since quitting: 52.3   Smokeless tobacco: Never Used  Substance Use Topics   Alcohol use: No     EXAMINATION:  Vital signs in last 24 hours: @VSRANGES @  General appearance: alert, cooperative and no distress Neck: no JVD and supple, symmetrical, trachea midline Lungs: clear to auscultation bilaterally Heart: regular rate and rhythm, S1, S2 normal, no murmur, click, rub or gallop Abdomen: soft, non-tender; bowel sounds normal; no masses,  no organomegaly Extremities: extremities normal, atraumatic, no cyanosis or edema and Homans sign is negative, no sign of DVT Pulses: 2+ and symmetric Skin: Skin color, texture, turgor normal. No rashes or lesions Neurologic: Alert and oriented X 3, normal strength and tone. Normal symmetric reflexes. Normal coordination and gait  Musculoskeletal:  ROM 0-110, Ligaments intact,  Imaging Review Plain radiographs demonstrate severe degenerative joint disease of the right knee. The overall alignment is significant varus. The bone quality appears to be good for age and reported activity level.  Assessment/Plan: Primary osteoarthritis, right knee   The patient history, physical examination and imaging studies are consistent with advanced degenerative joint disease of the right knee. The patient has failed conservative treatment.  The clearance notes were reviewed.  After discussion with the patient it was felt that Total Knee Replacement was indicated. The procedure,  risks, and  benefits of total knee arthroplasty were presented and reviewed. The risks including but not limited to aseptic loosening, infection, blood clots, vascular injury, stiffness, patella tracking problems complications among others were discussed. The patient acknowledged the explanation, agreed to proceed with the plan.  Carlynn Spry 08/03/2020, 12:21 PM

## 2020-08-03 NOTE — Progress Notes (Signed)
  [facility] Perioperative Services: Pre-Admission/Anesthesia Testing  Abnormal Lab Notification  Date: 08/03/20  Name: Henry Lindsey MRN:   [id number]  Re: Abnormal labs noted during PAT appointment  Provider Notified: Lovell Sheehan, MD Notification mode: Routed and/or faxed via [place] of concern: Lab Results  Component Value Date   STAPHAUREUS POSITIVE (A) 08/03/2020   MRSAPCR NEGATIVE 08/03/2020    Notes: Patient is scheduled for a TOTAL KNEE ARTHROPLASTY (Right Knee) on 08/08/2020. This is a Community education officer; no formal response required.   Honor Loh, MSN, APRN, FNP-C, CEN [facility]  Peri-operative Services Nurse Practitioner Phone: [phone] 08/03/20 2:30 PM

## 2020-08-03 NOTE — Patient Instructions (Signed)
Your procedure is scheduled on: Tuesday August 08, 2020 Report to Day Surgery inside [facility] 2nd floor . To find out your arrival time please call [phone] between 1PM - 3PM on Monday August 07, 2020.  Remember: Instructions that are not followed completely may result in serious medical risk,  up to and including death, or upon the discretion of your surgeon and anesthesiologist your  surgery may need to be rescheduled.     _X__ 1. Do not eat food after midnight the night before your procedure.                 No chewing gum or hard candies. You may drink clear liquids up to 2 hours                 before you are scheduled to arrive for your surgery- DO not drink clear                 liquids within 2 hours of the start of your surgery.                 Clear Liquids include:  water, apple juice without pulp, clear Gatorade, G2 or                  Gatorade Zero (avoid Red/Purple/Blue), Black Coffee or Tea (Do not add                 anything to coffee or tea).  __x__2.   Complete the "Ensure Clear Pre-surgery Clear Carbohydrate Drink" provided to you, 2 hours before arrival.   __X__3.  On the morning of surgery brush your teeth with toothpaste and water, you                may rinse your mouth with mouthwash if you wish.  Do not swallow any toothpaste of mouthwash.     _X__ 4.  No Alcohol for 24 hours before or after surgery.   _X__ 5.  Do Not Smoke or use e-cigarettes For 24 Hours Prior to Your Surgery.                 Do not use any chewable tobacco products for at least 6 hours prior to                 Surgery.  _X__  6.  Do not use any recreational drugs (marijuana, cocaine, heroin, ecstasy, MDMA or other)                For at least one week prior to your surgery.  Combination of these drugs with anesthesia                May have life threatening results.  __x__  7.  Notify your doctor if there is any change in your medical condition       (cold, fever, infections).     Do not wear jewelry, make-up, hairpins, clips or nail polish. Do not wear lotions, powders, or perfumes. You may wear deodorant. Do not shave 48 hours prior to surgery. Men may shave face and neck. Do not bring valuables to the hospital.    [facility] is not responsible for any belongings or valuables.  Contacts, dentures or bridgework may not be worn into surgery. Leave your suitcase in the car. After surgery it may be brought to your room. For patients admitted to the hospital, discharge time is determined by your treatment team.  Patients discharged the day of surgery will not be allowed to drive home.   Make arrangements for someone to be with you for the first 24 hours of your Same Day Discharge.    Please read over the following fact sheets that you were given:   Total Joint Packet    ____ Take these medicines the morning of surgery with A SIP OF WATER:    1. gabapentin (NEURONTIN) 400 MG   2. metoprolol succinate (TOPROL-XL) 100 MG   3. pantoprazole (PROTONIX) 40 MG   4 .HYDROcodone-acetaminophen (NORCO/VICODIN) 5-325 MG tablet    ____ Fleet Enema (as directed)   __x__ Use CHG Soap (or wipes) as directed  ____ Use Benzoyl Peroxide Gel as instructed  ____ Use inhalers on the day of surgery  ____ Stop metformin 2 days prior to surgery    __x__ Take 1/2 of usual insulin dose the night before surgery. No insulin the morning          of surgery.   insulin NPH-regular Human (70-30) 100 UNIT/ML injection  ____ Stop Coumadin/Plavix/aspirin   __x__ Stop Anti-inflammatories like Ibuprofen, Aleve, naproxen and or BC powders.    __x__ Stop supplements until after surgery.    __x__ Bring C-Pap to the hospital.    If you have any questions regarding your pre-procedure instructions,  Please call Pre-admit Testing at [phone].

## 2020-08-04 ENCOUNTER — Other Ambulatory Visit: Payer: Medicare Other

## 2020-08-08 ENCOUNTER — Ambulatory Visit: Payer: Medicare Other | Admitting: Urgent Care

## 2020-08-08 ENCOUNTER — Encounter
Admission: RE | Disposition: A | Discharge status: Home / Self Care | Payer: Self-pay | Source: Home / Self Care | Attending: Orthopedic Surgery

## 2020-08-08 ENCOUNTER — Encounter: Payer: Self-pay | Admitting: Orthopedic Surgery

## 2020-08-08 ENCOUNTER — Observation Stay
Admission: RE | Admit: 2020-08-08 | Discharge: 2020-08-09 | Disposition: A | Discharge status: Home / Self Care | Payer: Medicare Other | Attending: Orthopedic Surgery | Admitting: Orthopedic Surgery

## 2020-08-08 ENCOUNTER — Ambulatory Visit: Payer: Medicare Other

## 2020-08-08 ENCOUNTER — Other Ambulatory Visit: Payer: Self-pay

## 2020-08-08 ENCOUNTER — Observation Stay: Payer: Medicare Other

## 2020-08-08 DIAGNOSIS — M1711 Unilateral primary osteoarthritis, right knee: Secondary | ICD-10-CM | POA: Diagnosis not present

## 2020-08-08 DIAGNOSIS — Z7984 Long term (current) use of oral hypoglycemic drugs: Secondary | ICD-10-CM | POA: Insufficient documentation

## 2020-08-08 DIAGNOSIS — M25561 Pain in right knee: Secondary | ICD-10-CM

## 2020-08-08 DIAGNOSIS — M25562 Pain in left knee: Secondary | ICD-10-CM | POA: Diagnosis present

## 2020-08-08 DIAGNOSIS — Z79899 Other long term (current) drug therapy: Secondary | ICD-10-CM | POA: Diagnosis not present

## 2020-08-08 DIAGNOSIS — J449 Chronic obstructive pulmonary disease, unspecified: Secondary | ICD-10-CM | POA: Insufficient documentation

## 2020-08-08 DIAGNOSIS — I10 Essential (primary) hypertension: Secondary | ICD-10-CM | POA: Insufficient documentation

## 2020-08-08 DIAGNOSIS — E119 Type 2 diabetes mellitus without complications: Secondary | ICD-10-CM | POA: Insufficient documentation

## 2020-08-08 DIAGNOSIS — Z96651 Presence of right artificial knee joint: Secondary | ICD-10-CM

## 2020-08-08 HISTORY — PX: TOTAL KNEE ARTHROPLASTY: SHX125

## 2020-08-08 LAB — GLUCOSE, CAPILLARY
Glucose-Capillary: 88 mg/dL (ref 70–99)
Glucose-Capillary: 92 mg/dL (ref 70–99)
Glucose-Capillary: 268 mg/dL — ABNORMAL HIGH (ref 70–99)
Glucose-Capillary: 262 mg/dL — ABNORMAL HIGH (ref 70–99)

## 2020-08-08 SURGERY — ARTHROPLASTY, KNEE, TOTAL
Anesthesia: General | Site: Knee | Laterality: Right

## 2020-08-08 MED ORDER — OXYCODONE HCL 5 MG PO TABS
ORAL_TABLET | ORAL | Status: AC
  Administered 2020-08-08: 5 mg via ORAL
  Filled 2020-08-08: qty 1

## 2020-08-08 MED ORDER — DEXAMETHASONE SODIUM PHOSPHATE 10 MG/ML IJ SOLN
INTRAMUSCULAR | Status: AC
  Filled 2020-08-08: qty 1

## 2020-08-08 MED ORDER — ACETAMINOPHEN 10 MG/ML IV SOLN: 1000.0000 mg | Freq: Once | INTRAVENOUS | Status: DC | PRN

## 2020-08-08 MED ORDER — MIDAZOLAM HCL 2 MG/2ML IJ SOLN: 1.0000 mg | Freq: Once | INTRAMUSCULAR | Status: AC

## 2020-08-08 MED ORDER — BUPIVACAINE HCL (PF) 0.5 % IJ SOLN
INTRAMUSCULAR | Status: DC | PRN
  Administered 2020-08-08: 2.5 mL

## 2020-08-08 MED ORDER — INSULIN ASPART 100 UNIT/ML ~~LOC~~ SOLN
SUBCUTANEOUS | Status: AC
  Administered 2020-08-08: 8 [IU] via SUBCUTANEOUS
  Filled 2020-08-08: qty 1

## 2020-08-08 MED ORDER — VITAMIN D3 25 MCG (1000 UNIT) PO TABS
1000.0000 [IU] | ORAL_TABLET | Freq: Every day | ORAL | Status: DC
  Filled 2020-08-08: qty 1

## 2020-08-08 MED ORDER — NALOXONE HCL 2 MG/2ML IJ SOSY: 0.4000 mg | PREFILLED_SYRINGE | INTRAMUSCULAR | Status: DC | PRN

## 2020-08-08 MED ORDER — GLIPIZIDE 10 MG PO TABS
10.0000 mg | ORAL_TABLET | Freq: Every day | ORAL | Status: DC
  Administered 2020-08-09: 10 mg via ORAL
  Filled 2020-08-08 (×2): qty 1

## 2020-08-08 MED ORDER — BUPIVACAINE LIPOSOME 1.3 % IJ SUSP
INTRAMUSCULAR | Status: AC
  Filled 2020-08-08: qty 20

## 2020-08-08 MED ORDER — ONDANSETRON HCL 4 MG/2ML IJ SOLN: 4.0000 mg | Freq: Once | INTRAMUSCULAR | Status: DC | PRN

## 2020-08-08 MED ORDER — INSULIN ASPART 100 UNIT/ML ~~LOC~~ SOLN: 0.0000 [IU] | Freq: Three times a day (TID) | SUBCUTANEOUS | Status: DC

## 2020-08-08 MED ORDER — ONDANSETRON HCL 4 MG PO TABS: 4.0000 mg | ORAL_TABLET | Freq: Four times a day (QID) | ORAL | Status: DC | PRN

## 2020-08-08 MED ORDER — FENTANYL CITRATE (PF) 100 MCG/2ML IJ SOLN
INTRAMUSCULAR | Status: AC
  Filled 2020-08-08: qty 2

## 2020-08-08 MED ORDER — BUPIVACAINE HCL (PF) 0.25 % IJ SOLN
INTRAMUSCULAR | Status: DC | PRN
  Administered 2020-08-08: 30 mL

## 2020-08-08 MED ORDER — MAGNESIUM CITRATE PO SOLN
1.0000 | Freq: Once | ORAL | Status: DC | PRN
  Filled 2020-08-08 (×2): qty 296

## 2020-08-08 MED ORDER — ATORVASTATIN CALCIUM 20 MG PO TABS
40.0000 mg | ORAL_TABLET | Freq: Every day | ORAL | Status: DC
  Administered 2020-08-08: 40 mg via ORAL
  Filled 2020-08-08 (×3): qty 2

## 2020-08-08 MED ORDER — BUPIVACAINE HCL (PF) 0.5 % IJ SOLN
INTRAMUSCULAR | Status: AC
  Filled 2020-08-08: qty 20

## 2020-08-08 MED ORDER — FENTANYL CITRATE (PF) 100 MCG/2ML IJ SOLN: 25.0000 ug | INTRAMUSCULAR | Status: DC | PRN

## 2020-08-08 MED ORDER — TRANEXAMIC ACID-NACL 1000-0.7 MG/100ML-% IV SOLN
1000.0000 mg | INTRAVENOUS | Status: AC
  Administered 2020-08-08: 1000 mg via INTRAVENOUS

## 2020-08-08 MED ORDER — MIDAZOLAM HCL 2 MG/2ML IJ SOLN
INTRAMUSCULAR | Status: AC
  Administered 2020-08-08: 1 mg via INTRAVENOUS
  Filled 2020-08-08: qty 2

## 2020-08-08 MED ORDER — ALUM & MAG HYDROXIDE-SIMETH 200-200-20 MG/5ML PO SUSP: 30.0000 mL | ORAL | Status: DC | PRN

## 2020-08-08 MED ORDER — METOCLOPRAMIDE HCL 10 MG PO TABS: 5.0000 mg | ORAL_TABLET | Freq: Three times a day (TID) | ORAL | Status: DC | PRN

## 2020-08-08 MED ORDER — RIVAROXABAN 10 MG PO TABS
10.0000 mg | ORAL_TABLET | Freq: Every day | ORAL | Status: DC
  Administered 2020-08-09: 10 mg via ORAL
  Filled 2020-08-08 (×2): qty 1

## 2020-08-08 MED ORDER — SODIUM CHLORIDE 0.9 % IV SOLN
INTRAVENOUS | Status: DC | PRN
  Administered 2020-08-08: 60 mL

## 2020-08-08 MED ORDER — CEFAZOLIN SODIUM-DEXTROSE 2-4 GM/100ML-% IV SOLN
INTRAVENOUS | Status: AC
  Administered 2020-08-08: 2 g via INTRAVENOUS
  Filled 2020-08-08: qty 100

## 2020-08-08 MED ORDER — EPHEDRINE SULFATE 50 MG/ML IJ SOLN
INTRAMUSCULAR | Status: DC | PRN
  Administered 2020-08-08: 5 mg via INTRAVENOUS

## 2020-08-08 MED ORDER — TRANEXAMIC ACID-NACL 1000-0.7 MG/100ML-% IV SOLN
INTRAVENOUS | Status: AC
  Filled 2020-08-08: qty 100

## 2020-08-08 MED ORDER — POLYETHYLENE GLYCOL 3350 17 G PO PACK
17.0000 g | PACK | Freq: Every day | ORAL | Status: DC
  Administered 2020-08-08: 17 g via ORAL
  Filled 2020-08-08 (×2): qty 1

## 2020-08-08 MED ORDER — ACETAMINOPHEN 10 MG/ML IV SOLN
INTRAVENOUS | Status: AC
  Administered 2020-08-08: 1000 mg via INTRAVENOUS
  Filled 2020-08-08: qty 100

## 2020-08-08 MED ORDER — CHLORHEXIDINE GLUCONATE 0.12 % MT SOLN
OROMUCOSAL | Status: AC
  Administered 2020-08-08: 15 mL via OROMUCOSAL
  Filled 2020-08-08: qty 15

## 2020-08-08 MED ORDER — ADULT MULTIVITAMIN W/MINERALS CH
1.0000 | ORAL_TABLET | Freq: Every day | ORAL | Status: DC
  Filled 2020-08-08: qty 1

## 2020-08-08 MED ORDER — LATANOPROST 0.005 % OP SOLN
2.0000 [drp] | Freq: Every day | OPHTHALMIC | Status: DC
  Administered 2020-08-08: 2 [drp] via OPHTHALMIC
  Filled 2020-08-08: qty 2.5

## 2020-08-08 MED ORDER — LACTATED RINGERS IV SOLN: INTRAVENOUS | Status: DC

## 2020-08-08 MED ORDER — OXYCODONE HCL 5 MG/5ML PO SOLN: 5.0000 mg | Freq: Once | ORAL | Status: DC | PRN

## 2020-08-08 MED ORDER — GABAPENTIN 400 MG PO CAPS
ORAL_CAPSULE | ORAL | Status: AC
  Administered 2020-08-08: 400 mg via ORAL
  Filled 2020-08-08: qty 1

## 2020-08-08 MED ORDER — BUPIVACAINE-EPINEPHRINE (PF) 0.5% -1:200000 IJ SOLN
INTRAMUSCULAR | Status: AC
  Filled 2020-08-08: qty 30

## 2020-08-08 MED ORDER — ASCORBIC ACID 500 MG PO TABS
500.0000 mg | ORAL_TABLET | Freq: Every day | ORAL | Status: DC
  Filled 2020-08-08: qty 1

## 2020-08-08 MED ORDER — FENTANYL CITRATE (PF) 100 MCG/2ML IJ SOLN
INTRAMUSCULAR | Status: DC | PRN
Start: 2020-08-08 — End: 2020-08-08
  Administered 2020-08-08: 25 ug via INTRAVENOUS

## 2020-08-08 MED ORDER — TAMSULOSIN HCL 0.4 MG PO CAPS
0.4000 mg | ORAL_CAPSULE | Freq: Every day | ORAL | Status: DC
  Administered 2020-08-08 – 2020-08-09 (×2): 0.4 mg via ORAL
  Filled 2020-08-08 (×4): qty 1

## 2020-08-08 MED ORDER — TRAMADOL HCL 50 MG PO TABS
50.0000 mg | ORAL_TABLET | Freq: Four times a day (QID) | ORAL | Status: DC | PRN
  Administered 2020-08-09: 50 mg via ORAL

## 2020-08-08 MED ORDER — PANTOPRAZOLE SODIUM 40 MG PO TBEC
40.0000 mg | DELAYED_RELEASE_TABLET | Freq: Every day | ORAL | Status: DC
  Administered 2020-08-09: 40 mg via ORAL
  Filled 2020-08-08 (×2): qty 1

## 2020-08-08 MED ORDER — CEFAZOLIN SODIUM-DEXTROSE 2-4 GM/100ML-% IV SOLN: 2.0000 g | Freq: Four times a day (QID) | INTRAVENOUS | Status: AC

## 2020-08-08 MED ORDER — FENTANYL CITRATE (PF) 100 MCG/2ML IJ SOLN: 50.0000 ug | Freq: Once | INTRAMUSCULAR | Status: AC

## 2020-08-08 MED ORDER — MORPHINE SULFATE (PF) 4 MG/ML IV SOLN
2.0000 mg | INTRAVENOUS | Status: DC | PRN
  Administered 2020-08-09: 2 mg via INTRAVENOUS

## 2020-08-08 MED ORDER — PHENOL 1.4 % MT LIQD
1.0000 | OROMUCOSAL | Status: DC | PRN
  Filled 2020-08-08: qty 177

## 2020-08-08 MED ORDER — ORAL CARE MOUTH RINSE: 15.0000 mL | Freq: Once | OROMUCOSAL | Status: AC

## 2020-08-08 MED ORDER — CYANOCOBALAMIN 500 MCG PO TABS
500.0000 ug | ORAL_TABLET | Freq: Every day | ORAL | Status: DC
  Filled 2020-08-08: qty 1

## 2020-08-08 MED ORDER — VITAMIN E 180 MG (400 UNIT) PO CAPS
400.0000 [IU] | ORAL_CAPSULE | Freq: Every day | ORAL | Status: DC
  Filled 2020-08-08: qty 1

## 2020-08-08 MED ORDER — CEFAZOLIN SODIUM-DEXTROSE 2-4 GM/100ML-% IV SOLN
2.0000 g | INTRAVENOUS | Status: AC
  Administered 2020-08-08: 2 g via INTRAVENOUS

## 2020-08-08 MED ORDER — TIMOLOL MALEATE 0.5 % OP SOLN
2.0000 [drp] | Freq: Every day | OPHTHALMIC | Status: DC
  Administered 2020-08-09: 2 [drp] via OPHTHALMIC
  Filled 2020-08-08: qty 5

## 2020-08-08 MED ORDER — ONDANSETRON HCL 4 MG/2ML IJ SOLN: 4.0000 mg | Freq: Four times a day (QID) | INTRAMUSCULAR | Status: DC | PRN

## 2020-08-08 MED ORDER — OXYCODONE HCL 5 MG PO TABS
5.0000 mg | ORAL_TABLET | ORAL | Status: DC | PRN
  Administered 2020-08-09 (×2): 10 mg via ORAL

## 2020-08-08 MED ORDER — BISACODYL 10 MG RE SUPP
10.0000 mg | Freq: Every day | RECTAL | Status: DC | PRN
  Filled 2020-08-08: qty 1

## 2020-08-08 MED ORDER — NITROGLYCERIN 0.4 MG SL SUBL: 0.4000 mg | SUBLINGUAL_TABLET | SUBLINGUAL | Status: DC | PRN

## 2020-08-08 MED ORDER — CHOLESTYRAMINE 4 G PO PACK
4.0000 g | PACK | Freq: Three times a day (TID) | ORAL | Status: DC
  Administered 2020-08-08 – 2020-08-09 (×2): 4 g via ORAL
  Filled 2020-08-08 (×5): qty 1

## 2020-08-08 MED ORDER — DIPHENHYDRAMINE HCL 12.5 MG/5ML PO ELIX
12.5000 mg | ORAL_SOLUTION | ORAL | Status: DC | PRN
  Filled 2020-08-08: qty 10

## 2020-08-08 MED ORDER — MENTHOL 3 MG MT LOZG
1.0000 | LOZENGE | OROMUCOSAL | Status: DC | PRN
  Filled 2020-08-08: qty 9

## 2020-08-08 MED ORDER — DEXAMETHASONE SODIUM PHOSPHATE 10 MG/ML IJ SOLN
INTRAMUSCULAR | Status: DC | PRN
  Administered 2020-08-08: 4 mg

## 2020-08-08 MED ORDER — CHLORHEXIDINE GLUCONATE 0.12 % MT SOLN: 15.0000 mL | Freq: Once | OROMUCOSAL | Status: AC

## 2020-08-08 MED ORDER — SODIUM CHLORIDE FLUSH 0.9 % IV SOLN
INTRAVENOUS | Status: AC
  Filled 2020-08-08: qty 40

## 2020-08-08 MED ORDER — POVIDONE-IODINE 10 % EX SWAB: 2.0000 "application " | Freq: Once | CUTANEOUS | Status: DC

## 2020-08-08 MED ORDER — PROPOFOL 10 MG/ML IV BOLUS
INTRAVENOUS | Status: AC
  Filled 2020-08-08: qty 20

## 2020-08-08 MED ORDER — OXYCODONE HCL 5 MG PO TABS: 5.0000 mg | ORAL_TABLET | Freq: Once | ORAL | Status: DC | PRN

## 2020-08-08 MED ORDER — DOCUSATE SODIUM 100 MG PO CAPS
100.0000 mg | ORAL_CAPSULE | Freq: Two times a day (BID) | ORAL | Status: DC
  Administered 2020-08-08: 100 mg via ORAL
  Filled 2020-08-08 (×3): qty 1

## 2020-08-08 MED ORDER — CEFAZOLIN SODIUM-DEXTROSE 2-4 GM/100ML-% IV SOLN
INTRAVENOUS | Status: AC
  Filled 2020-08-08: qty 100

## 2020-08-08 MED ORDER — ONETOUCH ULTRA SYSTEM W/DEVICE KIT: 1.0000 | PACK | Freq: Once | Status: DC

## 2020-08-08 MED ORDER — GABAPENTIN 400 MG PO CAPS
400.0000 mg | ORAL_CAPSULE | Freq: Three times a day (TID) | ORAL | Status: DC
  Administered 2020-08-09: 400 mg via ORAL

## 2020-08-08 MED ORDER — SODIUM CHLORIDE 0.9% FLUSH
10.0000 mL | Freq: Three times a day (TID) | INTRAVENOUS | Status: DC
  Administered 2020-08-08 – 2020-08-09 (×2): 10 mL via INTRAVENOUS

## 2020-08-08 MED ORDER — PROPOFOL 500 MG/50ML IV EMUL
INTRAVENOUS | Status: AC
  Filled 2020-08-08: qty 50

## 2020-08-08 MED ORDER — FENTANYL CITRATE (PF) 100 MCG/2ML IJ SOLN
INTRAMUSCULAR | Status: AC
  Administered 2020-08-08: 50 ug via INTRAVENOUS
  Filled 2020-08-08: qty 2

## 2020-08-08 MED ORDER — CILOSTAZOL 100 MG PO TABS
100.0000 mg | ORAL_TABLET | Freq: Two times a day (BID) | ORAL | Status: DC
  Filled 2020-08-08 (×3): qty 1

## 2020-08-08 MED ORDER — BUPIVACAINE-EPINEPHRINE (PF) 0.5% -1:200000 IJ SOLN
INTRAMUSCULAR | Status: DC | PRN
  Administered 2020-08-08: 30 mL via PERINEURAL

## 2020-08-08 MED ORDER — METOCLOPRAMIDE HCL 5 MG/ML IJ SOLN: 5.0000 mg | Freq: Three times a day (TID) | INTRAMUSCULAR | Status: DC | PRN

## 2020-08-08 MED ORDER — SODIUM CHLORIDE 0.9 % IV SOLN: INTRAVENOUS | Status: DC

## 2020-08-08 MED ORDER — METOPROLOL SUCCINATE ER 25 MG PO TB24
100.0000 mg | ORAL_TABLET | Freq: Every day | ORAL | Status: DC
  Administered 2020-08-09: 100 mg via ORAL
  Filled 2020-08-08 (×2): qty 4

## 2020-08-08 MED ORDER — PROPOFOL 500 MG/50ML IV EMUL
INTRAVENOUS | Status: DC | PRN
  Administered 2020-08-08: 75 ug/kg/min via INTRAVENOUS

## 2020-08-08 MED ORDER — SODIUM CHLORIDE FLUSH 0.9 % IV SOLN
INTRAVENOUS | Status: AC
  Filled 2020-08-08: qty 20

## 2020-08-08 MED ORDER — INSULIN ASPART PROT & ASPART (70-30 MIX) 100 UNIT/ML ~~LOC~~ SUSP
23.0000 [IU] | Freq: Two times a day (BID) | SUBCUTANEOUS | Status: DC
  Administered 2020-08-09: 23 [IU] via SUBCUTANEOUS
  Filled 2020-08-08: qty 10

## 2020-08-08 MED ORDER — ACETAMINOPHEN 325 MG PO TABS: 650.0000 mg | ORAL_TABLET | Freq: Four times a day (QID) | ORAL | Status: DC | PRN

## 2020-08-08 SURGICAL SUPPLY — 68 items
BASEPLATE TIBIAL RT SZ 5 (Knees) IMPLANT
BLADE SAW 18WX90L 1.27 THK (BLADE) ×3 IMPLANT
BLADE SAW SAG 25X90X1.19 (BLADE) ×5 IMPLANT
BOWL CEMENT MIX W/ADAPTER (MISCELLANEOUS) ×3 IMPLANT
BRUSH SCRUB EZ  4% CHG (MISCELLANEOUS) ×6
BRUSH SCRUB EZ 4% CHG (MISCELLANEOUS) ×2 IMPLANT
CANISTER SUCT 1200ML W/VALVE (MISCELLANEOUS) ×3 IMPLANT
CANISTER SUCT 3000ML PPV (MISCELLANEOUS) ×6 IMPLANT
CEMENT BONE 1-PACK (Cement) ×6 IMPLANT
CHLORAPREP W/TINT 26 (MISCELLANEOUS) ×6 IMPLANT
COMP PATELLA FENESIS 32 OVAL (Stem) ×3 IMPLANT
COMPONENT FEM OXINIUM SZ 6 RT (Knees) ×2 IMPLANT
COMPONENT PTLLA GENS 32 OVAL (Stem) IMPLANT
COOLER POLAR GLACIER W/PUMP (MISCELLANEOUS) ×3 IMPLANT
COVER WAND RF STERILE (DRAPES) ×3 IMPLANT
CUFF TOURN SGL QUICK 24 (TOURNIQUET CUFF) ×3
CUFF TOURN SGL QUICK 30 (TOURNIQUET CUFF)
CUFF TRNQT CYL 24X4X16.5-23 (TOURNIQUET CUFF) IMPLANT
CUFF TRNQT CYL 30X4X21-28X (TOURNIQUET CUFF) ×1 IMPLANT
DRAPE 3/4 80X56 (DRAPES) ×6 IMPLANT
DRAPE INCISE IOBAN 66X60 STRL (DRAPES) ×3 IMPLANT
DRSG TEGADERM 2-3/8X2-3/4 SM (GAUZE/BANDAGES/DRESSINGS) ×4 IMPLANT
ELECT REM PT RETURN 9FT ADLT (ELECTROSURGICAL) ×3
ELECTRODE REM PT RTRN 9FT ADLT (ELECTROSURGICAL) ×1 IMPLANT
GAUZE SPONGE 4X4 12PLY STRL (GAUZE/BANDAGES/DRESSINGS) ×3 IMPLANT
GAUZE XEROFORM 1X8 LF (GAUZE/BANDAGES/DRESSINGS) ×5 IMPLANT
GLOVE BIO SURGEON STRL SZ8 (GLOVE) ×3 IMPLANT
GLOVE BIOGEL PI IND STRL 8 (GLOVE) IMPLANT
GLOVE BIOGEL PI IND STRL 8.5 (GLOVE) ×1 IMPLANT
GLOVE BIOGEL PI INDICATOR 8 (GLOVE) ×2
GLOVE BIOGEL PI INDICATOR 8.5 (GLOVE) ×2
GLOVE INDICATOR 8.0 STRL GRN (GLOVE) ×3 IMPLANT
GLOVE SURG ORTHO 8.0 STRL STRW (GLOVE) ×9 IMPLANT
GOWN STRL REUS W/ TWL LRG LVL3 (GOWN DISPOSABLE) ×1 IMPLANT
GOWN STRL REUS W/ TWL XL LVL3 (GOWN DISPOSABLE) ×1 IMPLANT
GOWN STRL REUS W/TWL LRG LVL3 (GOWN DISPOSABLE) ×3
GOWN STRL REUS W/TWL XL LVL3 (GOWN DISPOSABLE) ×3
HOOD PEEL AWAY FLYTE STAYCOOL (MISCELLANEOUS) ×9 IMPLANT
INSERT ARTI HI FLEX 10 SZ5-6 (Insert) ×2 IMPLANT
IRRIGATION SURGIPHOR STRL (IV SOLUTION) IMPLANT
IV NS 1000ML (IV SOLUTION) ×3
IV NS 1000ML BAXH (IV SOLUTION) ×1 IMPLANT
KIT TURNOVER KIT A (KITS) ×3 IMPLANT
MAT ABSORB  FLUID 56X50 GRAY (MISCELLANEOUS) ×3
MAT ABSORB FLUID 56X50 GRAY (MISCELLANEOUS) ×1 IMPLANT
NDL SAFETY ECLIPSE 18X1.5 (NEEDLE) ×1 IMPLANT
NDL SPNL 20GX3.5 QUINCKE YW (NEEDLE) ×1 IMPLANT
NEEDLE HYPO 18GX1.5 SHARP (NEEDLE) ×6
NEEDLE SPNL 20GX3.5 QUINCKE YW (NEEDLE) ×3 IMPLANT
NS IRRIG 1000ML POUR BTL (IV SOLUTION) ×3 IMPLANT
PACK TOTAL KNEE (MISCELLANEOUS) ×3 IMPLANT
PAD DE MAYO PRESSURE PROTECT (MISCELLANEOUS) ×3 IMPLANT
PAD WRAPON POLAR KNEE (MISCELLANEOUS) ×1 IMPLANT
PULSAVAC PLUS IRRIG FAN TIP (DISPOSABLE) ×3
STAPLER SKIN PROX 35W (STAPLE) ×3 IMPLANT
SUCTION FRAZIER HANDLE 10FR (MISCELLANEOUS) ×3
SUCTION TUBE FRAZIER 10FR DISP (MISCELLANEOUS) ×1 IMPLANT
SUT DVC 2 QUILL PDO  T11 36X36 (SUTURE) ×3
SUT DVC 2 QUILL PDO T11 36X36 (SUTURE) ×1 IMPLANT
SUT VIC AB 2-0 CT1 18 (SUTURE) ×3 IMPLANT
SUT VIC AB 2-0 CT1 27 (SUTURE)
SUT VIC AB 2-0 CT1 TAPERPNT 27 (SUTURE) IMPLANT
SUT VIC AB PLUS 45CM 1-MO-4 (SUTURE) ×3 IMPLANT
SYR 30ML LL (SYRINGE) ×11 IMPLANT
SYR BULB IRRIG 60ML STRL (SYRINGE) ×2 IMPLANT
TIBIAL BASEPLATE SZ 5 (Knees) ×3 IMPLANT
TIP FAN IRRIG PULSAVAC PLUS (DISPOSABLE) ×1 IMPLANT
WRAPON POLAR PAD KNEE (MISCELLANEOUS) ×3

## 2020-08-08 NOTE — Anesthesia Postprocedure Evaluation (Signed)
Anesthesia Post Note  Patient: Henry Lindsey  Procedure(s) Performed: TOTAL KNEE ARTHROPLASTY (Right Knee)  Patient location during evaluation: PACU Anesthesia Type: General Level of consciousness: oriented and awake and alert Pain management: pain level controlled Vital Signs Assessment: post-procedure vital signs reviewed and stable Respiratory status: spontaneous breathing, respiratory function stable and patient connected to nasal cannula oxygen Cardiovascular status: blood pressure returned to baseline and stable Postop Assessment: no headache, no backache and no apparent nausea or vomiting Anesthetic complications: no   No complications documented.   Last Vitals:  Vitals:   08/08/20 1223 08/08/20 1238  BP: (!) 175/67 (!) 175/71  Pulse: (!) 52 (!) 50  Resp: 12 10  Temp:  (!) 36.1 C  SpO2: 97% 97%    Last Pain:  Vitals:   08/08/20 1153  TempSrc:   PainSc: 0-No pain                 Arita Miss

## 2020-08-08 NOTE — Anesthesia Procedure Notes (Signed)
Spinal  Patient location during procedure: OR Staffing Performed: resident/CRNA  Resident/CRNA: Lashai Grosch Ben, CRNA Preanesthetic Checklist Completed: patient identified, IV checked, site marked, risks and benefits discussed, surgical consent, monitors and equipment checked, pre-op evaluation and timeout performed Spinal Block Patient position: sitting Prep: DuraPrep Patient monitoring: heart rate, cardiac monitor, continuous pulse ox and blood pressure Approach: midline Location: L3-4 Injection technique: single-shot Needle Needle type: Sprotte  Needle gauge: 24 G Needle length: 9 cm Assessment Sensory level: T4     

## 2020-08-08 NOTE — Anesthesia Procedure Notes (Signed)
Anesthesia Regional Block: Adductor canal block   Pre-Anesthetic Checklist: ,, timeout performed, Correct Patient, Correct Site, Correct Laterality, Correct Procedure, Correct Position, site marked, Risks and benefits discussed,  Surgical consent,  Pre-op evaluation,  At surgeon's request and post-op pain management  Laterality: Lower and Right  Prep: chloraprep       Needles:  Injection technique: Single-shot  Needle Type: Echogenic Needle     Needle Length: 9cm  Needle Gauge: 21     Additional Needles:   Procedures:,,,, ultrasound used (permanent image in chart),,,,  Narrative:  Start time: 08/08/2020 7:15 AM End time: 08/08/2020 7:22 AM Injection made incrementally with aspirations every 5 mL.  Performed by: Personally  Anesthesiologist: Arita Miss, MD  Additional Notes: Patient consented for risk and benefits of nerve block including but not limited to nerve damage, failed block, bleeding and infection.  Patient voiced understanding.  Functioning IV was confirmed and monitors were applied.  A echogenic needle was used. Sterile prep,hand hygiene and sterile gloves were used. Minimal sedation used for procedure.   No paresthesia endorsed by patient during the procedure.  Negative aspiration and negative test dose prior to incremental administration of local anesthetic. The patient tolerated the procedure well with no immediate complications.

## 2020-08-08 NOTE — H&P (Signed)
The patient has been re-examined, and the chart reviewed, and there have been no interval changes to the documented history and physical.  Plan a right total knee today.  Anesthesia is consulted regarding a peripheral nerve block for post-operative pain.  The risks, benefits, and alternatives have been discussed at length, and the patient is willing to proceed.     

## 2020-08-08 NOTE — Evaluation (Signed)
Physical Therapy Evaluation Patient Details Name: Henry Lindsey MRN: [id number] DOB: 1932/12/29 Today's Date: 08/08/2020   History of Present Illness  Henry Lindsey is a 86 year old male with PMH of R knee pain.  He has tried nonoperative treatment, including activity modification, pain medication, physical therapy and injections that has not helped his pain. He is status post R TKA on 08/08/20.   Clinical Impression  Patient presents sitting EOB on arrival to room with nursing fixing his IV. He is agreeable to PT evaluation. Pt performs 7 sit<>stand transfers throughout session due to using the urinal and bathroom due to abdominal pain. He is CGA throughout for safety. Pt ambulates to bathroom with good step through pattern with use of RW. Pt requires VCs throughout to keep walker close to him. Once in room, pt sits in recliner chair and performs seated exercises. He is encouraged to perform throughout his day. Patient is left in recliner chair with nurse in room. Pt is impulsive throughout session as he starts moving before it is safe to mobilize. Pt reports 0/10 pain in R knee at start and end of session due to medication. Patient is left in recliner chair with all needs and nurse in room. Pt currently presents with deficits in strength, mobility, functional activity tolerance, and balance. Pt would benefit from skilled PT during acute stay to address aforementioned deficits and HHPT at discharge to optimize return to PLOF and maximize functional mobility. This entire session was guided, instructed, and directly supervised by Greggory Stallion, DPT.     Follow Up Recommendations Home health PT    Equipment Recommendations  None recommended by PT    Recommendations for Other Services OT consult     Precautions / Restrictions Precautions Precautions: Knee Precaution Booklet Issued: No Precaution Comments: Will give packet tomorrow Restrictions Weight Bearing Restrictions: Yes RLE Weight  Bearing: Weight bearing as tolerated      Mobility  Bed Mobility               General bed mobility comments: Did not assess at patient was sitting EOB on arrival to room  Transfers Overall transfer level: Needs assistance Equipment used: Rolling walker (2 wheeled) Transfers: Sit to/from Stand Sit to Stand: Min guard         General transfer comment: Pt is able to perform transfer 7 times throughout session. He is able to stand up without any external assistance, however was CGA for safety. VCs utilized for proper hand placement.  Ambulation/Gait Ambulation/Gait assistance: Min guard Gait Distance (Feet): 50 Feet Assistive device: Rolling walker (2 wheeled) Gait Pattern/deviations: Step-through pattern     General Gait Details: Pt able to ambulated to bathroom and back to room with step through pattern. He is able to put weight through RLE without increased pain. Pt requires VCs for proper walker management.   Stairs            Wheelchair Mobility    Modified Rankin (Stroke Patients Only)       Balance Overall balance assessment: Independent (Patient able to maintain balance without feet touching floor)                                           Pertinent Vitals/Pain Pain Assessment: 0-10 Pain Score: 8  Pain Location: Stomach Pain Descriptors / Indicators: Aching;Sore;Tender Pain Intervention(s): Monitored during session;Other (comment) (Tried to use  the urinal and bathroom throughout session)    [place] expects to be discharged to:: Private residence Living Arrangements: Spouse/significant other Available Help at Discharge: Family;Available PRN/intermittently (Daughter and granddaughter) Type of Home: House Home Access: Level entry     Home Layout: One level Home Equipment: Cane - quad;Cane - single point;Walker - 2 wheels;Wheelchair - manual Additional Comments: Pt notes he will be living at his girlfriend's  house which has no steps. He lives in a 2 level house with starts to enter with no rails. He is putting in railing in the front that has 6 steps but unsure when it will be done.    Prior Function Level of Independence: Independent         Comments: Pt is still driving and doing yard work. He is independent with all I/ADLs     Hand Dominance   Dominant Hand: Right    Extremity/Trunk Assessment   Upper Extremity Assessment Upper Extremity Assessment: Overall WFL for tasks assessed (Pt is generally 4+/5 UE strength)    Lower Extremity Assessment Lower Extremity Assessment: Overall WFL for tasks assessed;RLE deficits/detail (LLE generally 4+/5 strength) RLE: Unable to fully assess due to pain RLE Sensation: decreased light touch (decreased light touch in R foot but all other is WNL)       Communication   Communication: No difficulties  Cognition Arousal/Alertness: Awake/alert Behavior During Therapy: Impulsive Overall Cognitive Status: Within Functional Limits for tasks assessed                                 General Comments: Patient is A&Ox4. He is impulsive as he moves before it is safe to      General Comments      Exercises Total Joint Exercises Goniometric ROM: R Knee ROM: Flexion: 75 degrees, Extension: 5 degrees General Exercises - Lower Extremity Ankle Circles/Pumps: AROM;Both;10 reps;Seated Quad Sets: AROM;10 reps;Seated;Right Hip ABduction/ADduction: AROM;Right;10 reps;Seated   Assessment/Plan    PT Assessment Patient needs continued PT services  PT Problem List Decreased strength;Decreased activity tolerance;Decreased range of motion;Decreased balance;Decreased mobility;Decreased knowledge of use of DME;Decreased safety awareness;Decreased knowledge of precautions;Pain       PT Treatment Interventions DME instruction;Gait training;Stair training;Functional mobility training;Therapeutic activities;Therapeutic exercise;Balance  training;Neuromuscular re-education    PT Goals (Current goals can be found in the Care Plan section)  Acute Rehab PT Goals Patient Stated Goal: "to go home" PT Goal Formulation: With patient Time For Goal Achievement: 08/22/20 Potential to Achieve Goals: Good Additional Goals Additional Goal #1: Pt will be modified independent with all bed mobility and transfers in order to return to PLOF with minimal difficulty.    Frequency BID   Barriers to discharge        Co-evaluation               AM-PAC PT "6 Clicks" Mobility  Outcome Measure Help needed turning from your back to your side while in a flat bed without using bedrails?: A Little Help needed moving from lying on your back to sitting on the side of a flat bed without using bedrails?: A Little Help needed moving to and from a bed to a chair (including a wheelchair)?: A Little Help needed standing up from a chair using your arms (e.g., wheelchair or bedside chair)?: A Little Help needed to walk in hospital room?: A Little Help needed climbing 3-5 steps with a railing? : A Lot 6 Click  Score: 17    End of Session Equipment Utilized During Treatment: Gait belt Activity Tolerance: Patient tolerated treatment well Patient left: in chair;with call bell/phone within reach;with nursing/sitter in room Nurse Communication: Mobility status PT Visit Diagnosis: Other abnormalities of gait and mobility (R26.89);Unsteadiness on feet (R26.81);Muscle weakness (generalized) (M62.81);Pain;Difficulty in walking, not elsewhere classified (R26.2) Pain - Right/Left: Right Pain - part of body: Knee    Time: 2419-9144 PT Time Calculation (min) (ACUTE ONLY): 42 min   Charges:              Noemi Chapel, SPT Bernita Raisin 08/08/2020, 5:27 PM

## 2020-08-08 NOTE — Anesthesia Preprocedure Evaluation (Signed)
Anesthesia Evaluation  Patient identified by MRN, date of birth, ID band Patient awake    Reviewed: Allergy & Precautions, NPO status , Patient's Chart, lab work & pertinent test results  History of Anesthesia Complications Negative for: history of anesthetic complications  Airway Mallampati: II  TM Distance: >3 FB Neck ROM: Full    Dental  (+) Poor Dentition, Dental Advisory Given   Pulmonary neg sleep apnea, COPD, Patient abstained from smoking.Not current smoker, former smoker,    breath sounds clear to auscultation- rhonchi (-) wheezing      Cardiovascular hypertension, Pt. on medications + Peripheral Vascular Disease  (-) CAD, (-) Past MI, (-) Cardiac Stents and (-) CABG  Rhythm:Regular Rate:Normal - Systolic murmurs and - Diastolic murmurs    Neuro/Psych Left sided neck pain CVA (L hand numbness), Residual Symptoms negative psych ROS   GI/Hepatic Neg liver ROS, GERD  Controlled,  Endo/Other  diabetes, Oral Hypoglycemic Agents  Renal/GU negative Renal ROS     Musculoskeletal  (+) Arthritis ,   Abdominal (+) - obese,   Peds  Hematology  (+) anemia ,   Anesthesia Other Findings Past Medical History: No date: BPH (benign prostatic hyperplasia) 11/2010: Carotid artery stenosis     Comment:  <50% bilaterally No date: COPD (chronic obstructive pulmonary disease) (HCC)     Comment:  noted CT 10/05/16 former smoker quit age 62  11/2010: CVA (cerebral infarction)     Comment:  r thalamic lacunar No date: Diabetes mellitus No date: GERD (gastroesophageal reflux disease) No date: Hyperlipidemia No date: Hypertension No date: Neuromuscular disorder ([place]) 06/2012: Peripheral vascular disease in diabetes mellitus ([place])     Comment:   95% occlusion s/p PTCA R SFA Dew Sept 2013 No date: Stroke [facility])   Reproductive/Obstetrics                             Anesthesia Physical  Anesthesia  Plan  ASA: III  Anesthesia Plan: General   Post-op Pain Management:  Regional for Post-op pain   Induction: Intravenous  PONV Risk Score and Plan: 2 and Propofol infusion, Ondansetron, Dexamethasone, TIVA and Midazolam  Airway Management Planned: Natural Airway  Additional Equipment: None  Intra-op Plan:   Post-operative Plan:   Informed Consent: I have reviewed the patients History and Physical, chart, labs and discussed the procedure including the risks, benefits and alternatives for the proposed anesthesia with the patient or authorized representative who has indicated his/her understanding and acceptance.     Dental advisory given  Plan Discussed with: CRNA and Anesthesiologist  Anesthesia Plan Comments: (Discussed R/B/A of neuraxial anesthesia technique with patient: - rare risks of spinal/epidural hematoma, nerve damage, infection - Risk of PDPH - Risk of nausea and vomiting - Risk of conversion to general anesthesia and its associated risks, including sore throat, damage to lips/teeth/oropharynx, and rare risks such as cardiac and respiratory events.  Discussed r/b/a of adductor canal nerve block, including:  - bleeding, infection, nerve damage - poor or non functioning block. Patient understands.  )        Anesthesia Quick Evaluation

## 2020-08-08 NOTE — Transfer of Care (Signed)
Immediate Anesthesia Transfer of Care Note  Patient: Henry Lindsey  Procedure(s) Performed: TOTAL KNEE ARTHROPLASTY (Right Knee)  Patient Location: PACU  Anesthesia Type:Spinal  Level of Consciousness: awake  Airway & Oxygen Therapy: Patient Spontanous Breathing and Patient connected to nasal cannula oxygen  Post-op Assessment: Report given to RN and Post -op Vital signs reviewed and stable  Post vital signs: Reviewed and stable  Last Vitals:  Vitals Value Taken Time  BP 117/50 08/08/20 0938  Temp    Pulse 48 08/08/20 0942  Resp 11 08/08/20 0942  SpO2 99 % 08/08/20 0942  Vitals shown include unvalidated device data.  Last Pain:  Vitals:   08/08/20 3729  TempSrc: Temporal  PainSc: 0-No pain         Complications: No complications documented.

## 2020-08-08 NOTE — Op Note (Signed)
DATE OF SURGERY:  08/08/2020 TIME: 9:39 AM  PATIENT NAME:  Henry Lindsey   AGE: 86 y.o.    PRE-OPERATIVE DIAGNOSIS:  M17.11 Unilateral primary osteoarthritis, right knee  POST-OPERATIVE DIAGNOSIS:  Same  PROCEDURE:  Procedure(s): TOTAL KNEE ARTHROPLASTY, right  SURGEON:  Lovell Sheehan, MD   ASSISTANT:  Carlynn Spry, PA-C  OPERATIVE IMPLANTS: Tamala [employer], Cruciate Retaining Oxinium Femoral component size  6, Fixed Bearing Tray size 5, Patella polyethylene 3-peg oval button size 32 mm, with a 10 mm HighFlex insert.   PREOPERATIVE INDICATIONS:  Henry Lindsey is an 86 y.o. male who has a diagnosis of M17.11 Unilateral primary osteoarthritis, right knee and elected for a right total knee arthroplasty after failing nonoperative treatment, including activity modification, pain medication, physical therapy and injections who has significant impairment of their activities of daily living.  Radiographs have demonstrated tricompartmental osteoarthritis joint space narrowing, osteophytes, subchondral sclerosis and cyst formation.  The risks, benefits, and alternatives were discussed at length including but not limited to the risks of infection, bleeding, nerve or blood vessel injury, knee stiffness, fracture, dislocation, loosening or failure of the hardware and the need for further surgery. Medical risks include but not limited to DVT and pulmonary embolism, myocardial infarction, stroke, pneumonia, respiratory failure and death. I discussed these risks with the patient in my office prior to the date of surgery. They understood these risks and were willing to proceed.  OPERATIVE FINDINGS AND UNIQUE ASPECTS OF THE CASE:  All three compartments with advanced and severe degenerative changes, large osteophytes and an abundance of synovial fluid. Significant deformity was also noted. A decision was made to proceed with total knee arthroplasty.   OPERATIVE DESCRIPTION:  The patient was brought to  the operative room and placed in a supine position after undergoing placement of a general anesthetic. IV antibiotics were given. Patient received tranexamic acid. The lower extremity was prepped and draped in the usual sterile fashion.  A time out was performed to verify the patient's name, date of birth, medical record number, correct site of surgery and correct procedure to be performed. The timeout was also used to confirm the patient received antibiotics and that appropriate instruments, implants and radiographs studies were available in the room.  The leg was elevated and exsanguinated with an Esmarch and the tourniquet was inflated to 250 mmHg.  A midline incision was made over the left knee.. A medial parapatellar arthrotomy was then made and the patella subluxed laterally and the knee was brought into 90 of flexion. Hoffa's fat pad along with the anterior cruciate ligament was resected and the medial joint line was exposed.  Attention was then turned to preparation of the patella. The thickness of the patella was measured with a caliper, the diameter measured with the patella templates.  The patella resection was then made with an oscillating saw using the patella cutting guide.  The 32 mm button fit appropriately.  3 peg holes for the patella component were then drilled.  The extramedullary tibial cutting guide was then placed using the anterior tibial crest and second ray of the foot as a reference.  The tibial cutting guide was adjusted to allow for appropriate posterior slope.  The tibial cutting block was pinned into position. The slotted stylus was used to measure the proximal tibial resection of 9 mm off the high lateral side. Care was taken during the tibial resection to protect the medial and collateral ligaments.  The resected tibial bone was removed.  The distal femur was resected using the Visionaire cutting guide.  Care was taken to protect the collateral ligaments during distal  femoral resection.  The distal femoral resection was performed with an oscillating saw. The femoral cutting guide was then removed. Extension gap was measured with a 10 mm spacer block and alignment and extension was confirmed using a long alignment rod. The femur was sized to be a 6. Rotation of the referencing guide was checked with the epicondylar axis and Whitesides line. Then the 4-in-1 cutting jig was then applied to the distal femur. A stylus was used to confirm that the anterior femur would not be notched.   Then the anterior, posterior and chamfer femoral cuts were then made with an oscillating saw.  The knee was distracted and all posterior osteophytes were removed.  The flexion gap was then measured with a flexion spacer block and long alignment rod and was found to be symmetric with the extension gap and perpendicular to mechanical axis of the tibia.  The proximal tibia plateau was then sized with trial trays. The best coverage was achieved with a size 5. This tibial tray was then pinned into position. The proximal tibia was then prepared with the keel punch.  After tibial preparation was completed, all trial components were inserted with polyethylene trials. The knee achieved full extension and flexed to 120 degrees. Ligament were stable to varus and valgus at full extension as well as 30, 60 and 90 degrees of flexion.   The trials were then placed. Knee was taken through a full range of motion and deemed to be stable with the trial components. All trial components were then removed.  The joint was copiously irrigated with pulse lavage.  The final total knee arthroplasty components were then cemented into place. The knee was held in extension while cement was allowed to cure.The knee was taken through a range of motion and the patella tracked well and the knee was again irrigated copiously.  The knee capsule was then injected with Exparel.  The medial arthrotomy was closed with #1 Vicryl and #2  Quill. The subcutaneous tissue closed with  2-0 vicryl, and skin approximated with staples.  A dry sterile and compressive dressing was applied.  A Polar Care was applied to the operative knee.  The patient was awakened and brought to the PACU in stable and satisfactory condition.  All sharp, lap and instrument counts were correct at the conclusion the case. I spoke with the patient's family in the postop consultation room to let them know the case had been performed without complication and the patient was stable in recovery room.   Total tourniquet time was 52 minutes.

## 2020-08-09 ENCOUNTER — Encounter: Payer: Self-pay | Admitting: Orthopedic Surgery

## 2020-08-09 DIAGNOSIS — M1711 Unilateral primary osteoarthritis, right knee: Secondary | ICD-10-CM | POA: Diagnosis not present

## 2020-08-09 LAB — CBC
HCT: 38.3 % — ABNORMAL LOW (ref 39.0–52.0)
Hemoglobin: 12.6 g/dL — ABNORMAL LOW (ref 13.0–17.0)
MCH: 29.5 pg (ref 26.0–34.0)
MCHC: 32.9 g/dL (ref 30.0–36.0)
MCV: 89.7 fL (ref 80.0–100.0)
Platelets: 212 10*3/uL (ref 150–400)
RBC: 4.27 MIL/uL (ref 4.22–5.81)
RDW: 12.8 % (ref 11.5–15.5)
WBC: 13.1 10*3/uL — ABNORMAL HIGH (ref 4.0–10.5)
nRBC: 0 % (ref 0.0–0.2)

## 2020-08-09 LAB — BASIC METABOLIC PANEL
Anion gap: 9 (ref 5–15)
BUN: 23 mg/dL (ref 8–23)
CO2: 25 mmol/L (ref 22–32)
Calcium: 8.5 mg/dL — ABNORMAL LOW (ref 8.9–10.3)
Chloride: 106 mmol/L (ref 98–111)
Creatinine, Ser: 1.11 mg/dL (ref 0.61–1.24)
GFR, Estimated: >60 mL/min (ref >60)
Glucose, Bld: 191 mg/dL — ABNORMAL HIGH (ref 70–99)
Potassium: 3.8 mmol/L (ref 3.5–5.1)
Sodium: 140 mmol/L (ref 135–145)

## 2020-08-09 LAB — GLUCOSE, CAPILLARY
Glucose-Capillary: 178 mg/dL — ABNORMAL HIGH (ref 70–99)
Glucose-Capillary: 131 mg/dL — ABNORMAL HIGH (ref 70–99)

## 2020-08-09 LAB — HEMOGLOBIN A1C
Hgb A1c MFr Bld: 6.7 % — ABNORMAL HIGH (ref 4.8–5.6)
Mean Plasma Glucose: 146 mg/dL

## 2020-08-09 MED ORDER — MORPHINE SULFATE (PF) 2 MG/ML IV SOLN
INTRAVENOUS | Status: AC
  Filled 2020-08-09: qty 1

## 2020-08-09 MED ORDER — OXYCODONE HCL 5 MG PO TABS
ORAL_TABLET | ORAL | Status: AC
  Filled 2020-08-09: qty 2

## 2020-08-09 MED ORDER — RIVAROXABAN 10 MG PO TABS
10.0000 mg | ORAL_TABLET | Freq: Every day | ORAL | 0 refills | Status: DC
Start: 2020-08-10 — End: 2020-11-17

## 2020-08-09 MED ORDER — ONDANSETRON HCL 4 MG PO TABS: 4.0000 mg | ORAL_TABLET | Freq: Four times a day (QID) | ORAL | 0 refills | Status: DC | PRN

## 2020-08-09 MED ORDER — INSULIN ASPART 100 UNIT/ML ~~LOC~~ SOLN
SUBCUTANEOUS | Status: AC
  Administered 2020-08-09: 3 [IU] via SUBCUTANEOUS
  Filled 2020-08-09: qty 1

## 2020-08-09 MED ORDER — GABAPENTIN 400 MG PO CAPS
ORAL_CAPSULE | ORAL | Status: AC
  Filled 2020-08-09: qty 1

## 2020-08-09 MED ORDER — METHOCARBAMOL 500 MG PO TABS: 500.0000 mg | ORAL_TABLET | Freq: Three times a day (TID) | ORAL | Status: DC | PRN

## 2020-08-09 MED ORDER — METHOCARBAMOL 500 MG PO TABS
ORAL_TABLET | ORAL | Status: AC
  Administered 2020-08-09: 500 mg via ORAL
  Filled 2020-08-09: qty 1

## 2020-08-09 MED ORDER — OXYCODONE HCL 5 MG PO TABS: 5.0000 mg | ORAL_TABLET | ORAL | 0 refills | Status: DC | PRN

## 2020-08-09 MED ORDER — TRAMADOL HCL 50 MG PO TABS
ORAL_TABLET | ORAL | Status: AC
  Filled 2020-08-09: qty 1

## 2020-08-09 MED ORDER — DOCUSATE SODIUM 100 MG PO CAPS
100.0000 mg | ORAL_CAPSULE | Freq: Two times a day (BID) | ORAL | 0 refills | Status: DC
Start: 2020-08-09 — End: 2020-11-21

## 2020-08-09 NOTE — Discharge Summary (Signed)
Physician Discharge Summary  Patient ID: DAL BLEW MRN: [id number] DOB/AGE: 1933/08/02 86 y.o.  Admit date: 08/08/2020 Discharge date: 08/09/2020  Admission Diagnoses:  M17.11 Unilateral primary osteoarthritis, right knee <principal problem not specified>  Discharge Diagnoses:  M17.11 Unilateral primary osteoarthritis, right knee Active Problems:   S/P TKR (total knee replacement) using cement, right   Past Medical History:  Diagnosis Date  . Arthritis   . BPH (benign prostatic hyperplasia)   . Carotid artery stenosis 11/2010   <50% bilaterally  . COPD (chronic obstructive pulmonary disease) ([place])    noted CT 10/05/16 former smoker quit age 62   . CVA (cerebral infarction) 11/2010   r thalamic lacunar  . Diabetes mellitus   . GERD (gastroesophageal reflux disease)   . Hyperlipidemia   . Hypertension   . Neuromuscular disorder ([place])   . Peripheral vascular disease in diabetes mellitus ([place]) 06/2012    95% occlusion s/p PTCA R SFA Dew Sept 2013  . Stroke [facility])     Surgeries: Procedure(s): TOTAL KNEE ARTHROPLASTY on 08/08/2020   Consultants (if any):   Discharged Condition: Improved  Hospital Course: Henry Lindsey is an 86 y.o. male who was admitted 08/08/2020 with a diagnosis of  M17.11 Unilateral primary osteoarthritis, right knee <principal problem not specified> and went to the operating room on 08/08/2020 and underwent the above named procedures.    He was given perioperative antibiotics:  Anti-infectives (From admission, onward)   Start     Dose/Rate Route Frequency Ordered Stop   08/08/20 2009  ceFAZolin (ANCEF) 2-4 GM/100ML-% IVPB       Note to Pharmacy: [place], Candace   : cabinet override      08/08/20 2009 08/08/20 2014   08/08/20 1415  ceFAZolin (ANCEF) IVPB 2g/100 mL premix        2 g 200 mL/hr over 30 Minutes Intravenous Every 6 hours 08/08/20 1414 08/08/20 2042   08/08/20 0644  ceFAZolin (ANCEF) 2-4 GM/100ML-% IVPB       Note to Pharmacy:  [place], [place]   : cabinet override      08/08/20 0644 08/08/20 2042   08/08/20 0600  ceFAZolin (ANCEF) IVPB 2g/100 mL premix        2 g 200 mL/hr over 30 Minutes Intravenous On call to O.R. 08/08/20 0200 08/08/20 0741    .  He was given sequential compression devices, early ambulation, and Xarelto for DVT prophylaxis.  He benefited maximally from the hospital stay and there were no complications.    Recent vital signs:  Vitals:   08/09/20 0551 08/09/20 1042  BP: 118/82 (!) 154/73  Pulse: 74 68  Resp: 20 18  Temp:  98.3 F (36.8 C)  SpO2: 97% 97%    Recent laboratory studies:  Lab Results  Component Value Date   HGB 12.6 (L) 08/09/2020   HGB 14.4 08/03/2020   HGB 13.9 06/09/2020   Lab Results  Component Value Date   WBC 13.1 (H) 08/09/2020   PLT 212 08/09/2020   Lab Results  Component Value Date   INR 1.0 08/03/2020   Lab Results  Component Value Date   NA 140 08/09/2020   K 3.8 08/09/2020   CL 106 08/09/2020   CO2 25 08/09/2020   BUN 23 08/09/2020   CREATININE 1.11 08/09/2020   GLUCOSE 191 (H) 08/09/2020    Discharge Medications:   Allergies as of 08/09/2020      Reactions   Metformin And Related Diarrhea  Medication List    STOP taking these medications   aspirin EC 81 MG tablet   HYDROcodone-acetaminophen 5-325 MG tablet Commonly known as: NORCO/VICODIN     TAKE these medications   ascorbic acid 500 MG tablet Commonly known as: VITAMIN C Take 500 mg by mouth daily.   atorvastatin 40 MG tablet Commonly known as: LIPITOR Take 1 tablet (40 mg total) by mouth daily.   cholecalciferol 25 MCG (1000 UNIT) tablet Commonly known as: VITAMIN D Take 1,000 Units by mouth daily.   cholestyramine 4 g packet Commonly known as: QUESTRAN Take 1 packet (4 g total) by mouth 3 (three) times daily.   cilostazol 100 MG tablet Commonly known as: PLETAL TAKE 1 TABLET BY MOUTH TWICE DAILY   docusate sodium 100 MG capsule Commonly known as:  COLACE Take 1 capsule (100 mg total) by mouth 2 (two) times daily.   gabapentin 400 MG capsule Commonly known as: NEURONTIN TAKE 1 CAPSULE(400 MG) BY MOUTH THREE TIMES DAILY What changed: See the new instructions.   glipiZIDE 10 MG tablet Commonly known as: GLUCOTROL TAKE 1 TABLET(10 MG) BY MOUTH TWICE DAILY BEFORE A MEAL What changed:   how much to take  how to take this  when to take this  additional instructions   INS SYRINGE/NEEDLE 1CC/28G 28G X 1/2" 1 ML Misc Commonly known as: B-D INSULIN SYRINGE 1CC/28G USE TO ADMINISTER INSULIN DAILY   insulin NPH-regular Human (70-30) 100 UNIT/ML injection Inject 23 Units into the skin in the morning and at bedtime.   latanoprost 0.005 % ophthalmic solution Commonly known as: XALATAN Place 2 drops into both eyes at bedtime.   metoprolol succinate 100 MG 24 hr tablet Commonly known as: TOPROL-XL TAKE 1 TABLET BY MOUTH EVERY DAY WITH OR IMMEDIATELY FOLLOWING A MEAL What changed:   how much to take  how to take this  when to take this  additional instructions   multivitamin with minerals Tabs tablet Take 1 tablet by mouth daily.   nitroGLYCERIN 0.4 MG SL tablet Commonly known as: NITROSTAT Place 1 tablet (0.4 mg total) under the tongue every 5 (five) minutes as needed for chest pain. MAXIMUM 3 TABLETS What changed: additional instructions   ondansetron 4 MG tablet Commonly known as: ZOFRAN Take 1 tablet (4 mg total) by mouth every 6 (six) hours as needed for nausea.   ONE TOUCH ULTRA SYSTEM KIT w/Device Kit 1 kit by Does not apply route once. Use DX code E11.59   ONE TOUCH ULTRA TEST test strip Generic drug: glucose blood USE 1 STRIP TO TEST THREE TIMES DAILY AS DIRECTED   OneTouch Delica Lancets 60O Misc Use three times daily to check blood sugar.   oxyCODONE 5 MG immediate release tablet Commonly known as: Oxy IR/ROXICODONE Take 1 tablet (5 mg total) by mouth every 4 (four) hours as needed for moderate pain  or severe pain.   pantoprazole 40 MG tablet Commonly known as: PROTONIX Take 40 mg by mouth daily.   rivaroxaban 10 MG Tabs tablet Commonly known as: XARELTO Take 1 tablet (10 mg total) by mouth daily with breakfast. Start taking on: August 10, 2020   tamsulosin 0.4 MG Caps capsule Commonly known as: FLOMAX TAKE 1 CAPSULE(0.4 MG) BY MOUTH DAILY   timolol 0.5 % ophthalmic solution Commonly known as: TIMOPTIC Place 2 drops into both eyes daily.   vitamin B-12 500 MCG tablet Commonly known as: CYANOCOBALAMIN Take 500 mcg by mouth daily.   vitamin E 180 MG (400  UNITS) capsule Take 400 Units by mouth daily.   Voltaren 1 % Gel Generic drug: diclofenac Sodium Apply 2 g topically 2 (two) times daily as needed (pain).            Durable Medical Equipment  (From admission, onward)         Start     Ordered   08/09/20 1145  For home use only DME 3 n 1  Once        08/09/20 1144   08/09/20 1145  For home use only DME Walker rolling  Once       Question Answer Comment  Walker: With 5 Inch Wheels   Patient needs a walker to treat with the following condition Osteoarthritis of right knee      08/09/20 1144          Diagnostic Studies: DG Knee Right Port  Result Date: 08/08/2020 CLINICAL DATA:  Status post total right knee replacement. EXAM: PORTABLE RIGHT KNEE - 1-2 VIEW COMPARISON:  No prior. FINDINGS: Total right knee replacement. Hardware intact. Anatomic alignment. No acute bony abnormality. Peripheral vascular calcification. IMPRESSION: 1. Total right knee replacement with anatomic alignment. No acute bony abnormality. 2. Peripheral vascular disease. Electronically Signed   By: [place]   On: 08/08/2020 10:23   [place] OR NERVE BLOCK-IMAGE ONLY [facility])  Result Date: 08/08/2020 There is no interpretation for this exam.  This order is for images obtained during a surgical procedure.  Please See "Surgeries" Tab for more information regarding the procedure.     Disposition: Discharge disposition: 01-Home or Self Care     Follow up Monday in office for appt.  Call to confirm [phone] for dressing change       Signed: Carlynn Spry ,PA-C 08/09/2020, 11:45 AM

## 2020-08-09 NOTE — Evaluation (Addendum)
Occupational Therapy Evaluation Patient Details Name: Henry Lindsey MRN: [id number] DOB: 29-Dec-1932 Today's Date: 08/09/2020    History of Present Illness Henry Lindsey is a 86 year old male with PMH of R knee pain.  He has tried nonoperative treatment, including activity modification, pain medication, physical therapy and injections that has not helped his pain. He is status post R TKA on 08/08/20   Clinical Impression   Pt seen for OT evaluation POD #1 from R TKA. Pt reports he technically lives alone, but reports he will be staying with his girlfriend upon d/c who can help him. Pt lives in a [facility] with no STE. Pt reports being completely INDEP at baseline including driving and doing yard work. Upon OT assessment, pt requires SBA for ADL transfers for MIN verbal cues for hand placement and one cue to extend R LE with sit to/from stand to lower surfaces to reduce pain. Pt demos understanding. OT facilitates ed re: role of OT, potential to use AE for LB ADLs as needed (pt able to modify by bending at waist instead), safe hand placement with use of RW for ADL transfers, polar care mgt, bathing recommendations (no shower until after f/u appt), compression stocking mgt. Pt with good understanding. Handouts issued-OT tips for TKR and Polar care mgt handout. Ultimately, pt able to perform LB ADLs with extended time and modified technique. Do not anticipate need for continued OT f/u as pt is receptive to education this date, demos good understanding, and has good support at home.     Follow Up Recommendations  No OT follow up    Equipment Recommendations  3 in 1 bedside commode    Recommendations for Other Services       Precautions / Restrictions Precautions Precautions: Knee Precaution Comments: OT handout given Restrictions Weight Bearing Restrictions: Yes RLE Weight Bearing: Weight bearing as tolerated      Mobility Bed Mobility Overal bed mobility: Modified Independent              General bed mobility comments: increased time, pt uses UEs to assist in scooting R LE toward EOB.    Transfers Overall transfer level: Needs assistance Equipment used: Rolling walker (2 wheeled) Transfers: Sit to/from Stand Sit to Stand: Supervision         General transfer comment: Extended time, MIN cues for hand placement and for extending R LE for sit<>stand to/from lower surfaces such as recliner to reduce pain. Pt with good reception.    Balance Overall balance assessment: Modified Independent                                         ADL either performed or assessed with clinical judgement   ADL Overall ADL's : Modified independent                                       General ADL Comments: Increased time for LB dressing, bending at the waist. Introduced to AE use for LB ADLs, but able to perform reasonably well, bending while in seated position. SBA with RW for ADL transfers, MIN verbal cues for safe hand placement and extending R LE for lower surfaces to reduce pain.     Vision Patient Visual Report: No change from baseline       Perception  Praxis      Pertinent Vitals/Pain Pain Assessment: 0-10 Pain Score: 6  Pain Location: R upper thigh Pain Descriptors / Indicators: Grimacing;Tender Pain Intervention(s): Monitored during session;Patient requesting pain meds-RN notified;Repositioned;Ice applied (in chair with LEs elevated and polar care re-applied)     Hand Dominance Right   Extremity/Trunk Assessment Upper Extremity Assessment Upper Extremity Assessment: Overall WFL for tasks assessed   Lower Extremity Assessment Lower Extremity Assessment: Defer to PT evaluation;RLE deficits/detail RLE Deficits / Details: reports he feels sensation is normal but somewhat difficult to tell d/t ice. RLE: Unable to fully assess due to pain       Communication Communication Communication: No difficulties   Cognition  Arousal/Alertness: Awake/alert Behavior During Therapy: WFL for tasks assessed/performed Overall Cognitive Status: Within Functional Limits for tasks assessed                                 General Comments: Pt is A&O, appropriate with all commands. Appears less impulsive this date than with PT yesterday, very reponsive to education/cues.   General Comments       Exercises Other Exercises Other Exercises: OT facilitates ed re: role of OT, potential to use AE for LB ADLs as needed (pt able to modify by bending at waist instead), safe hand placement with use of RW for ADL transfers, polar care mgt, bathing recommendations (no shower until after f/u appt), compression stocking mgt. Pt with good understanding. Handouts issued-OT tips for TKR and Polar care mgt handout.   Shoulder Instructions      Home Living Family/patient expects to be discharged to:: Private residence Living Arrangements: Spouse/significant other Available Help at Discharge: Family;Available PRN/intermittently (daughter and granddaughter. Also significant other can assist some.) Type of Home: House Home Access: Level entry     Home Layout: One level     Bathroom Shower/Tub: Occupational psychologist: Handicapped height Bathroom Accessibility: Yes   Home Equipment: [place] - quad;Cane - single point;Walker - 2 wheels;Wheelchair - manual   Additional Comments: Pt notes he will be living at his girlfriend's house which has no steps. He lives in a 2 level house with starts to enter with no rails. He is putting in railing in the front that has 6 steps but unsure when it will be done.      Prior Functioning/Environment Level of Independence: Independent        Comments: Pt is still driving and doing yard work. He is independent with all I/ADLs and mobility at baseline.        OT Problem List: Decreased range of motion;Decreased activity tolerance;Decreased knowledge of use of DME or AE       OT Treatment/Interventions: Self-care/ADL training;Therapeutic activities;Patient/family education    OT Goals(Current goals can be found in the care plan section) Acute Rehab OT Goals Patient Stated Goal: "to go home" OT Goal Formulation: All assessment and education complete, DC therapy  OT Frequency:     Barriers to D/C:            Co-evaluation              AM-PAC OT "6 Clicks" Daily Activity     Outcome Measure Help from another person eating meals?: None Help from another person taking care of personal grooming?: None Help from another person toileting, which includes using toliet, bedpan, or urinal?: None Help from another person bathing (including washing, rinsing, drying)?: A Little  Help from another person to put on and taking off regular upper body clothing?: None Help from another person to put on and taking off regular lower body clothing?: None 6 Click Score: 23   End of Session Equipment Utilized During Treatment: Rolling walker Nurse Communication: Mobility status;Other (comment) (notified no c/o dizziness with mobility, notified pt asking if it's possible to get topical pain cream.)  Activity Tolerance: Patient tolerated treatment well Patient left: in chair;with call bell/phone within reach  OT Visit Diagnosis: Other abnormalities of gait and mobility (R26.89)                Time: 0375-4360 OT Time Calculation (min): 53 min Charges:  OT General Charges $OT Visit: 1 Visit OT Evaluation $OT Eval Low Complexity: 1 Low OT Treatments $Self Care/Home Management : 23-37 mins $Therapeutic Activity: 8-22 mins  Gerrianne Scale, MS, OTR/L ascom [phone] 08/09/20, 10:10 AM

## 2020-08-09 NOTE — Discharge Instructions (Signed)
Continue weight bear as tolerated on the right lower extremity.    Elevate the right lower extremity whenever possible and continue the polar care while elevating the extremity. Patient may shower. No bath or submerging the wound.    Take Xarelto as directed for blood clot prevention.  Continue to work on knee range of motion exercises at home as instructed by physical therapy. Continue to use a walker for assistance with ambulation until cleared by physical therapy.  Call [phone] with any questions, such as fever > 101.5 degrees, drainage from the wound or shortness of breath.

## 2020-08-09 NOTE — Progress Notes (Signed)
Subjective:  Patient reports pain as mild to moderate.    Objective:   VITALS:   Vitals:   08/09/20 0115 08/09/20 0151 08/09/20 0551 08/09/20 1042  BP:  (!) 142/70 118/82 (!) 154/73  Pulse: 85 84 74 68  Resp:  18 20 18   Temp:    98.3 F (36.8 C)  TempSrc:      SpO2: 94% 95% 97% 97%  Weight:      Height:        PHYSICAL EXAM:  Neurologically intact ABD soft Neurovascular intact Sensation intact distally Intact pulses distally Dorsiflexion/Plantar flexion intact Incision: scant drainage No cellulitis present Compartment soft dressing changed  LABS  Results for orders placed or performed during the hospital encounter of 08/08/20 (from the past 24 hour(s))  Hemoglobin A1c     Status: Abnormal   Collection Time: 08/08/20  2:43 PM  Result Value Ref Range   Hgb A1c MFr Bld 6.7 (H) 4.8 - 5.6 %   Mean Plasma Glucose 146 mg/dL  Glucose, capillary     Status: Abnormal   Collection Time: 08/08/20  5:19 PM  Result Value Ref Range   Glucose-Capillary 268 (H) 70 - 99 mg/dL   Comment 1 Notify RN   Glucose, capillary     Status: Abnormal   Collection Time: 08/08/20  9:36 PM  Result Value Ref Range   Glucose-Capillary 262 (H) 70 - 99 mg/dL  CBC     Status: Abnormal   Collection Time: 08/09/20  6:41 AM  Result Value Ref Range   WBC 13.1 (H) 4.0 - 10.5 K/uL   RBC 4.27 4.22 - 5.81 MIL/uL   Hemoglobin 12.6 (L) 13.0 - 17.0 g/dL   HCT 38.3 (L) 39 - 52 %   MCV 89.7 80.0 - 100.0 fL   MCH 29.5 26.0 - 34.0 pg   MCHC 32.9 30.0 - 36.0 g/dL   RDW 12.8 11.5 - 15.5 %   Platelets 212 150 - 400 K/uL   nRBC 0.0 0.0 - 0.2 %  Basic metabolic panel     Status: Abnormal   Collection Time: 08/09/20  6:41 AM  Result Value Ref Range   Sodium 140 135 - 145 mmol/L   Potassium 3.8 3.5 - 5.1 mmol/L   Chloride 106 98 - 111 mmol/L   CO2 25 22 - 32 mmol/L   Glucose, Bld 191 (H) 70 - 99 mg/dL   BUN 23 8 - 23 mg/dL   Creatinine, Ser 1.11 0.61 - 1.24 mg/dL   Calcium 8.5 (L) 8.9 - 10.3 mg/dL    GFR, Estimated >60 >60 mL/min   Anion gap 9 5 - 15  Glucose, capillary     Status: Abnormal   Collection Time: 08/09/20  7:41 AM  Result Value Ref Range   Glucose-Capillary 178 (H) 70 - 99 mg/dL    DG Knee Right Port  Result Date: 08/08/2020 CLINICAL DATA:  Status post total right knee replacement. EXAM: PORTABLE RIGHT KNEE - 1-2 VIEW COMPARISON:  No prior. FINDINGS: Total right knee replacement. Hardware intact. Anatomic alignment. No acute bony abnormality. Peripheral vascular calcification. IMPRESSION: 1. Total right knee replacement with anatomic alignment. No acute bony abnormality. 2. Peripheral vascular disease. Electronically Signed   By: [place]   On: 08/08/2020 10:23   [place] OR NERVE BLOCK-IMAGE ONLY [facility])  Result Date: 08/08/2020 There is no interpretation for this exam.  This order is for images obtained during a surgical procedure.  Please See "Surgeries" Tab for more information  regarding the procedure.    Assessment/Plan: 1 Day Post-Op   Active Problems:   S/P TKR (total knee replacement) using cement, right   Advance diet Up with therapy  Discharge home today   Carlynn Spry , PA-C 08/09/2020, 11:39 AM

## 2020-08-09 NOTE — Progress Notes (Signed)
Physical Therapy Treatment Patient Details Name: Henry Lindsey MRN: [id number] DOB: [birth year] Today's Date: 08/09/2020    History of Present Illness Chanc Kervin is a 86 year old male with PMH of R knee pain.  He has tried nonoperative treatment, including activity modification, pain medication, physical therapy and injections that has not helped his pain. He is status post R TKA on 08/08/20    PT Comments    Patient presents sitting in chair on arrival to room and is agreeable to PT session. Pt notes 10/10 knee/quad soreness this morning. He performs seated exercises before stairs to loosen up knee. Pt performs 4 stairs with BUE support on rails demonstrating step to pattern. During ambulation, pt demonstrates good step through pattern with minimal use of UE support on RW. However, he requires VCs throughout to keep walker close to him for safety. Pt limited due to fatigue/soreness of RLE. Once back in his room, pt performed more exercises and instructed to continue throughout his day. Patient is left in recliner chair with all needs. Pt continues to benefit from skilled PT services to maximize return to PLOF. Will continue to follow POC. Updated discharge recommendations as pt already has outpatient PT appointments set up. This entire session was guided, instructed, and directly supervised by Greggory Stallion, DPT.    Follow Up Recommendations  Outpatient PT     Equipment Recommendations  None recommended by PT    Recommendations for Other Services       Precautions / Restrictions Precautions Precautions: Knee Precaution Booklet Issued: Yes (comment) Precaution Comments: PT handout Restrictions Weight Bearing Restrictions: Yes RLE Weight Bearing: Weight bearing as tolerated    Mobility  Bed Mobility Overal bed mobility: Modified Independent             General bed mobility comments: Did not assess as pt sitting in recliner chair on arrival to room  Transfers Overall  transfer level: Needs assistance Equipment used: Rolling walker (2 wheeled) Transfers: Sit to/from Stand Sit to Stand: Supervision         General transfer comment: Pt able to stand up by himself with extended time, supervision for safety  Ambulation/Gait Ambulation/Gait assistance: Min guard Gait Distance (Feet): 15 Feet Assistive device: Rolling walker (2 wheeled) Gait Pattern/deviations: Step-through pattern     General Gait Details: Pt ambulated with good step through pattern. Pt fatigued due to muscles soreness and knee pain.   Stairs Stairs: Yes Stairs assistance: Supervision Stair Management: Two rails;Step to pattern Number of Stairs: 4 General stair comments: Pt demonstrated good step to pattern with some UE support on bilateral rails.   Wheelchair Mobility    Modified Rankin (Stroke Patients Only)       Balance Overall balance assessment: Modified Independent                                          Cognition Arousal/Alertness: Awake/alert Behavior During Therapy: WFL for tasks assessed/performed Overall Cognitive Status: Within Functional Limits for tasks assessed                                 General Comments: Pt is less impulsive today than yesterday as he is responsive to cues      Exercises Total Joint Exercises Knee Flexion: AAROM;10 reps;Seated;Right Goniometric ROM: R Knee: Flexion: 60; Extension: 9  General Exercises - Lower Extremity Long Arc Quad: Right;10 reps;Seated;AAROM Heel Slides: AAROM;Right;10 reps;Seated Straight Leg Raises: AAROM;Right;10 reps;Seated Other Exercises Other Exercises: OT facilitates ed re: role of OT, potential to use AE for LB ADLs as needed (pt able to modify by bending at waist instead), safe hand placement with use of RW for ADL transfers, polar care mgt, bathing recommendations (no shower until after f/u appt), compression stocking mgt. Pt with good understanding. Handouts  issued-OT tips for TKR and Polar care mgt handout. Other Exercises: PT faciliates education for car transfers    General Comments        Pertinent Vitals/Pain Pain Assessment: 0-10 Pain Score: 10-Worst pain ever Pain Location: R upper thigh Pain Descriptors / Indicators: Grimacing;Tender Pain Intervention(s): Limited activity within patient's tolerance;Monitored during session;Ice applied    Home Living Family/patient expects to be discharged to:: Private residence Living Arrangements: Spouse/significant other Available Help at Discharge: Family;Available PRN/intermittently (daughter and granddaughter. Also significant other can assist some.) Type of Home: House Home Access: Level entry   Home Layout: One level Home Equipment: [place] - quad;Cane - single point;Walker - 2 wheels;Wheelchair - manual Additional Comments: Pt notes he will be living at his girlfriend's house which has no steps. He lives in a 2 level house with starts to enter with no rails. He is putting in railing in the front that has 6 steps but unsure when it will be done.    Prior Function Level of Independence: Independent      Comments: Pt is still driving and doing yard work. He is independent with all I/ADLs and mobility at baseline.   PT Goals (current goals can now be found in the care plan section) Acute Rehab PT Goals Patient Stated Goal: "to go home" PT Goal Formulation: With patient Time For Goal Achievement: 08/22/20 Potential to Achieve Goals: Good Progress towards PT goals: Progressing toward goals    Frequency    BID      PT Plan Current plan remains appropriate    Co-evaluation              AM-PAC PT "6 Clicks" Mobility   Outcome Measure  Help needed turning from your back to your side while in a flat bed without using bedrails?: A Little Help needed moving from lying on your back to sitting on the side of a flat bed without using bedrails?: A Little Help needed moving to and  from a bed to a chair (including a wheelchair)?: A Little Help needed standing up from a chair using your arms (e.g., wheelchair or bedside chair)?: A Little Help needed to walk in hospital room?: A Little Help needed climbing 3-5 steps with a railing? : A Little 6 Click Score: 18    End of Session Equipment Utilized During Treatment: Gait belt Activity Tolerance: Patient tolerated treatment well Patient left: in chair;with call bell/phone within reach Nurse Communication: Mobility status PT Visit Diagnosis: Other abnormalities of gait and mobility (R26.89);Unsteadiness on feet (R26.81);Muscle weakness (generalized) (M62.81);Pain;Difficulty in walking, not elsewhere classified (R26.2) Pain - Right/Left: Right Pain - part of body: Knee     Time: 0935-1005 PT Time Calculation (min) (ACUTE ONLY): 30 min  Charges:                         Noemi Chapel, SPT Bernita Raisin 08/09/2020, 10:27 AM

## 2020-08-22 DIAGNOSIS — Z4889 Encounter for other specified surgical aftercare: Secondary | ICD-10-CM | POA: Insufficient documentation

## 2020-08-31 NOTE — H&P (Signed)
Henry Lindsey MRN:  [id number] DOB/SEX:  06-03-1933/male  CHIEF COMPLAINT:  Painful right Knee  HISTORY: Patient is a 86 y.o. male presented with a history of pain in the right knee. Onset of symptoms was gradual starting several years ago with gradually worsening course since that time. Prior procedures on the knee include . Patient has been treated conservatively with over-the-counter NSAIDs and activity modification. Patient currently rates pain in the knee at 10 out of 10 with activity. There is pain at night.  PAST MEDICAL HISTORY: Patient Active Problem List   Diagnosis Date Noted  . S/P TKR (total knee replacement) using cement, right 08/08/2020  . Plantar fasciitis 03/22/2020  . Lumbar radiculopathy 07/05/2019  . Chronic obstructive pulmonary disease ([place]) 06/11/2019  . Gastroesophageal reflux disease without esophagitis 06/11/2019  . Neuropathy 06/11/2019  . Recurrent productive cough 03/27/2019  . Chronic diarrhea   . Benign neoplasm of transverse colon   . Chronic iron deficiency anemia 10/09/2017  . History of diverticulitis 10/05/2017  . S/P laparoscopic cholecystectomy 12/05/2016  . Bile duct leak   . Chest pain 10/05/2016  . Pain in joint, ankle and foot 02/25/2015  . Diabetic neuropathy ([place]) 10/09/2014  . BPH with obstruction/lower urinary tract symptoms 10/09/2014  . Bursitis of hip, right 04/05/2014  . Glaucoma 04/05/2014  . Peripheral autonomic neuropathy due to DM ([place]) 12/28/2013  . Vitamin D deficiency 11/03/2013  . Incomplete emptying of bladder 08/04/2013  . Benign localized hyperplasia of prostate with urinary obstruction and other lower urinary tract symptoms (LUTS)(600.21) 06/24/2013  . Nocturia 06/24/2013  . Orchalgia 06/24/2013  . Impotence due to erectile dysfunction 06/01/2013  . Routine general medical examination at a health care facility 04/04/2013  . CAD (coronary artery disease) 07/21/2012  . Peripheral vascular disease in diabetes mellitus  ([place])   . Carotid artery stenosis with cerebral infarction ([place])   . History of basal cell carcinoma excision 08/05/2011  . 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 Uncontrolled type 2 diabetes mellitus with diabetic polyneuropathy, with long-term current use of insulin ([place]) 08/05/2011  . Hyperlipidemia   . Hypertension   . History of CVA (cerebrovascular accident)    Past Medical History:  Diagnosis Date  . Arthritis   . BPH (benign prostatic hyperplasia)   . Carotid artery stenosis 11/2010   <50% bilaterally  . COPD (chronic obstructive pulmonary disease) ([place])    noted CT 10/05/16 former smoker quit age 82   . CVA (cerebral infarction) 11/2010   r thalamic lacunar  . Diabetes mellitus   . GERD (gastroesophageal reflux disease)   . Hyperlipidemia   . Hypertension   . Neuromuscular disorder ([place])   . Peripheral vascular disease in diabetes mellitus ([place]) 06/2012    95% occlusion s/p PTCA R SFA Dew Sept 2013  . Stroke [facility])    Past Surgical History:  Procedure Laterality Date  . CHOLECYSTECTOMY N/A 11/13/2016   Procedure: LAPAROSCOPIC CHOLECYSTECTOMY WITH INTRAOPERATIVE CHOLANGIOGRAM;  Surgeon: Olean Ree, MD;  Location: [facility] ORS;  Service: General;  Laterality: N/A;  . COLONOSCOPY WITH PROPOFOL N/A 02/10/2018   Procedure: COLONOSCOPY WITH PROPOFOL;  Surgeon: Lucilla Lame, MD;  Location: [facility] ENDOSCOPY;  Service: Endoscopy;  Laterality: N/A;  . ERCP N/A 11/17/2016   Procedure: ENDOSCOPIC RETROGRADE CHOLANGIOPANCREATOGRAPHY (ERCP);  Surgeon: Irene Shipper, MD;  Location: [facility] ENDOSCOPY;  Service: Endoscopy;  Laterality: N/A;  . ERCP N/A 02/04/2017   Procedure: ENDOSCOPIC RETROGRADE CHOLANGIOPANCREATOGRAPHY (ERCP) Stent removal;  Surgeon: Lucilla Lame, MD;  Location: [facility] ENDOSCOPY;  Service: Endoscopy;  Laterality: N/A;  . IR GENERIC HISTORICAL  10/06/2016   IR PERC CHOLECYSTOSTOMY 10/06/2016 Aletta Edouard, MD [facility]-INTERV RAD  . JOINT REPLACEMENT Left 2014   left knee  . TOTAL KNEE ARTHROPLASTY  Right 08/08/2020   Procedure: TOTAL KNEE ARTHROPLASTY;  Surgeon: Lovell Sheehan, MD;  Location: [facility] ORS;  Service: Orthopedics;  Laterality: Right;  . UPPER GASTROINTESTINAL ENDOSCOPY    . WRIST SURGERY       MEDICATIONS:   No medications prior to admission.    ALLERGIES:   Allergies  Allergen Reactions  . Metformin And Related Diarrhea    REVIEW OF SYSTEMS:  Pertinent items noted in HPI and remainder of comprehensive ROS otherwise negative.   FAMILY HISTORY:   Family History  Problem Relation Age of Onset  . Diabetes Mother     SOCIAL HISTORY:   Social History   Tobacco Use  . Smoking status: Former Smoker    Packs/day: 0.50    Years: 33.00    Pack years: 16.50    Types: Cigarettes    Start date: 1935-07[birth year]    Quit date: 04/21/1968    Years since quitting: 52.3  . Smokeless tobacco: Never Used  Substance Use Topics  . Alcohol use: No     EXAMINATION:  Vital signs in last 24 hours:    General appearance: alert, cooperative and no distress Lungs: clear to auscultation bilaterally Heart: regular rate and rhythm, S1, S2 normal, no murmur, click, rub or gallop Abdomen: soft, non-tender; bowel sounds normal; no masses,  no organomegaly Extremities: extremities normal, atraumatic, no cyanosis or edema and Homans sign is negative, no sign of DVT Pulses: 2+ and symmetric Skin: Skin color, texture, turgor normal. No rashes or lesions Neurologic: Alert and oriented X 3, normal strength and tone. Normal symmetric reflexes. Normal coordination and gait  Musculoskeletal:  ROM 0-105, Ligaments intact,  Imaging Review Plain radiographs demonstrate severe degenerative joint disease of the right knee. The overall alignment is significant varus. The bone quality appears to be good for age and reported activity level.  Assessment/Plan: Primary osteoarthritis, right knee   The patient history, physical examination and imaging studies are consistent with advanced degenerative  joint disease of the right knee. The patient has failed conservative treatment.  The clearance notes were reviewed.  After discussion with the patient it was felt that Total Knee Replacement was indicated. The procedure,  risks, and benefits of total knee arthroplasty were presented and reviewed. The risks including but not limited to aseptic loosening, infection, blood clots, vascular injury, stiffness, patella tracking problems complications among others were discussed. The patient acknowledged the explanation, agreed to proceed with the plan.  Carlynn Spry 08/31/2020, 6:55 AM

## 2020-09-01 ENCOUNTER — Encounter: Payer: Self-pay | Admitting: Orthopedic Surgery

## 2020-11-17 ENCOUNTER — Ambulatory Visit (INDEPENDENT_AMBULATORY_CARE_PROVIDER_SITE_OTHER): Payer: Medicare Other | Admitting: Family

## 2020-11-17 ENCOUNTER — Other Ambulatory Visit: Payer: Self-pay

## 2020-11-17 ENCOUNTER — Encounter: Payer: Self-pay | Admitting: Family

## 2020-11-17 VITALS — BP 122/66 | HR 90 | Ht 71.0 in | Wt 203.0 lb

## 2020-11-17 DIAGNOSIS — I739 Peripheral vascular disease, unspecified: Secondary | ICD-10-CM | POA: Diagnosis not present

## 2020-11-17 DIAGNOSIS — I2510 Atherosclerotic heart disease of native coronary artery without angina pectoris: Secondary | ICD-10-CM

## 2020-11-17 DIAGNOSIS — E785 Hyperlipidemia, unspecified: Secondary | ICD-10-CM

## 2020-11-17 DIAGNOSIS — I2584 Coronary atherosclerosis due to calcified coronary lesion: Secondary | ICD-10-CM | POA: Diagnosis not present

## 2020-11-17 DIAGNOSIS — I63239 Cerebral infarction due to unspecified occlusion or stenosis of unspecified carotid arteries: Secondary | ICD-10-CM

## 2020-11-17 DIAGNOSIS — I5020 Unspecified systolic (congestive) heart failure: Secondary | ICD-10-CM | POA: Diagnosis not present

## 2020-11-17 DIAGNOSIS — Z8673 Personal history of transient ischemic attack (TIA), and cerebral infarction without residual deficits: Secondary | ICD-10-CM

## 2020-11-17 NOTE — H&P (View-Only) (Signed)
Office Visit    Patient Name: Henry Lindsey Date of Encounter: 11/17/2020  Primary Care Provider:  Baxter Hire, MD Primary Cardiologist:  Ida Rogue, MD Electrophysiologist:  None   Chief Complaint    Henry Lindsey is a 87 y.o. male with a hx of HLD, aortic atherosclerosis, coronary artery calcification on CT, prior tobacco use presents today for follow-up after echocardiogram  Past Medical History    Past Medical History:  Diagnosis Date  . Arthritis   . BPH (benign prostatic hyperplasia)   . Carotid artery stenosis 11/2010   <50% bilaterally  . COPD (chronic obstructive pulmonary disease) ([place])    noted CT 10/05/16 former smoker quit age 26   . CVA (cerebral infarction) 11/2010   r thalamic lacunar  . Diabetes mellitus   . GERD (gastroesophageal reflux disease)   . Hyperlipidemia   . Hypertension   . Neuromuscular disorder ([place])   . Peripheral vascular disease in diabetes mellitus ([place]) 06/2012    95% occlusion s/p PTCA R SFA Dew Sept 2013  . Stroke [facility])    Past Surgical History:  Procedure Laterality Date  . CHOLECYSTECTOMY N/A 11/13/2016   Procedure: LAPAROSCOPIC CHOLECYSTECTOMY WITH INTRAOPERATIVE CHOLANGIOGRAM;  Surgeon: Olean Ree, MD;  Location: [facility] ORS;  Service: General;  Laterality: N/A;  . COLONOSCOPY WITH PROPOFOL N/A 02/10/2018   Procedure: COLONOSCOPY WITH PROPOFOL;  Surgeon: Lucilla Lame, MD;  Location: [facility] ENDOSCOPY;  Service: Endoscopy;  Laterality: N/A;  . ERCP N/A 11/17/2016   Procedure: ENDOSCOPIC RETROGRADE CHOLANGIOPANCREATOGRAPHY (ERCP);  Surgeon: Irene Shipper, MD;  Location: [facility] ENDOSCOPY;  Service: Endoscopy;  Laterality: N/A;  . ERCP N/A 02/04/2017   Procedure: ENDOSCOPIC RETROGRADE CHOLANGIOPANCREATOGRAPHY (ERCP) Stent removal;  Surgeon: Lucilla Lame, MD;  Location: [facility] ENDOSCOPY;  Service: Endoscopy;  Laterality: N/A;  . IR GENERIC HISTORICAL  10/06/2016   IR PERC CHOLECYSTOSTOMY 10/06/2016 Aletta Edouard, MD [facility]INTER[facility]  .  JOINT REPLACEMENT Left 2014   left knee  . TOTAL KNEE ARTHROPLASTY Right 08/08/2020   Procedure: TOTAL KNEE ARTHROPLASTY;  Surgeon: Lovell Sheehan, MD;  Location: [facility] ORS;  Service: Orthopedics;  Laterality: Right;  . UPPER GASTROINTESTINAL ENDOSCOPY    . WRIST SURGERY     Allergies  Allergies  Allergen Reactions  . Metformin And Related Diarrhea    History of Present Illness    Henry Lindsey is a 87 y.o. male with a hx of HLD, aortic atherosclerosis, coronary artery calcification on CT, prior tobacco use last seen 07/20/20.  CT scan December 2017 with moderate diffuse aortic atherosclerosis as well as moderate diffuse three-vessel coronary disease.  He was seen by Dr. Rockey Situ April 2021 for preoperative clearance for knee surgery and deemed acceptable risk for the planned procedure.  He was seen 07/20/20 due to bilateral upper extremity numbness for which he was working with physical therapy and Ortho. He reported no anginal symptoms. He is very active mowing both his and his girlfriend's yard with a push mower as well as splitting his own wood.  Seen by PCP 10/03/20 diagnosed on bronchitis started on doxycycline and muccinex. He had chest x-ray which showed pneumonia as well as bilateral pleural effusions. He was seen 10/17/20 by PCP with worsening bilateral pleural effusions and pulmonary vascular congestion on CXR as well as lower extremity edema. He was started on Lasix 26m BID x3 days then QD and recommended for echocardiogram. At follow up with PCP 10/25/20 his Lasix was reduced to 271mdaily as he had  good response. Echocardiogram 11/14/20 at [facility] showed LVEF 35%, normal RVSP, moderate MR, mild TR, mild pericardial effusion.   He presents today for follow up. His chief complaint is difficulty walking. Reports weakness in his legs with ambulation that improved after knee surgery and physical therapy but more noticeable after the last few weeks. We reviewed his echocardiogram in  detail. Reports no chest pain, pressure, tightness. No orthopnea, PND. Does note lower extremity edema has improved since PCP started him on Lasix. Reports dyspnea on exertion with more than usual activity.   EKGs/Labs/Other Studies Reviewed:   The following studies were reviewed today:  Echo 11/14/20 ECHOCARDIOGRAPHIC DESCRIPTIONS  AORTIC ROOT                   Size: Normal             Dissection: INDETERM FOR DISSECTION  AORTIC VALVE               Leaflets: Tricuspid                   Morphology: Normal               Mobility: Fully mobile  LEFT VENTRICLE                   Size: MILDLY ENLARGED               Anterior: HYPOCONTRACTILE            Contraction: REGIONALLY IMPAIRED            Lateral: Normal             Closest EF: 35% (Estimated)                 Septal: HYPOCONTRACTILE              LV Masses: No Masses                       Apical: HYPOCONTRACTILE                    LVH: None                          Inferior: HYPOCONTRACTILE                                                      Posterior: HYPOCONTRACTILE           Dias.FxClass: (Grade 1) relaxation abnormal, E/A reversal  MITRAL VALVE               Leaflets: Normal                        Mobility: Fully mobile             Morphology: Normal  LEFT ATRIUM                   Size: MODERATELY ENLARGED          LA Masses: No masses              IA Septum: Normal IAS  MAIN PA  Size: Normal  PULMONIC VALVE             Morphology: Normal                        Mobility: Fully mobile  RIGHT VENTRICLE                   Size: MILDLY ENLARGED              Free Wall: Normal            Contraction: Normal                       RV Masses: No Masses  TRICUSPID VALVE               Leaflets: Normal                        Mobility: Fully mobile             Morphology: Normal  RIGHT ATRIUM                   Size: MILDLY ENLARGED               RA Other: None                RA Mass: No masses  PERICARDIUM                  Fluid: MILD EFFUSION UNIFORM                 Fibrin: FIBRIN  INFERIOR VENACAVA                   Size: DILATED ABNORMAL RESPIRATORY COLLAPSE  _________________________________________________________________________________________   DOPPLER ECHO and OTHER SPECIAL PROCEDURES                 Aortic: No AR                      No AS                         95.4 cm/sec peak vel       3.6 mmHg peak grad                         2.4 mmHg mean grad         2.9 cm^2 by DOPPLER                 Mitral: MODERATE MR                No MS                         MV Inflow E Vel = 97.7 cm/sec      MV Annulus E'Vel = nm*                         E/E'Ratio = nm*              Tricuspid: MILD TR                    No TS  328.5 cm/sec peak TR vel   58.2 mmHg peak RV pressure              Pulmonary: MILD PR                    No PS  _________________________________________________________________________________________  INTERPRETATION  MODERATE LV SYSTOLIC DYSFUNCTION (See above)  NORMAL RIGHT VENTRICULAR SYSTOLIC FUNCTION  MODERATE VALVULAR REGURGITATION (See above)  NO VALVULAR STENOSIS  Closest EF: 35% (Estimated)  Contraction: REGIONALLY IMPAIRED  Mitral: MODERATE MR  Tricuspid: MILD TR  Fluid: MILD EFFUSION  Fluid: UNIFORM   EKG:  EKG is ordered today.  The ekg ordered today demonstrates NSR 90 bpm with fusion complexes and occasional PVC.  No acute ST/T wave changes.  Recent Labs: 08/09/2020: BUN 23; Creatinine, Ser 1.11; Hemoglobin 12.6; Platelets 212; Potassium 3.8; Sodium 140  Recent Lipid Panel    Component Value Date/Time   CHOL 107 12/24/2018 1146   TRIG 164.0 (H) 12/24/2018 1146   HDL 35.90 (L) 12/24/2018 1146   CHOLHDL 3 12/24/2018 1146   VLDL 32.8 12/24/2018 1146   LDLCALC 38 12/24/2018 1146   LDLDIRECT 62.0 10/04/2016 0942   Home Medications   Current Meds  Medication Sig  . ascorbic acid (VITAMIN C) 500 MG tablet Take 500 mg by mouth daily.   [name]  atorvastatin (LIPITOR) 40 MG tablet Take 1 tablet (40 mg total) by mouth daily.  . Blood Glucose Monitoring Suppl (ONE TOUCH ULTRA SYSTEM KIT) w/Device KIT 1 kit by Does not apply route once. Use DX code E11.59  . cholecalciferol (VITAMIN D) 25 MCG (1000 UNIT) tablet Take 1,000 Units by mouth daily.  . cholestyramine (QUESTRAN) 4 g packet Take 1 packet (4 g total) by mouth 3 (three) times daily.  . cilostazol (PLETAL) 100 MG tablet TAKE 1 TABLET BY MOUTH TWICE DAILY  . diclofenac Sodium (VOLTAREN) 1 % GEL Apply 2 g topically 2 (two) times daily as needed (pain).  [name] docusate sodium (COLACE) 100 MG capsule Take 1 capsule (100 mg total) by mouth 2 (two) times daily.  . furosemide (LASIX) 20 MG tablet Take 20 mg by mouth daily.  [name] gabapentin (NEURONTIN) 400 MG capsule TAKE 1 CAPSULE(400 MG) BY MOUTH THREE TIMES DAILY  . glipiZIDE (GLUCOTROL) 10 MG tablet TAKE 1 TABLET(10 MG) BY MOUTH TWICE DAILY BEFORE A MEAL  . INS SYRINGE/NEEDLE 1CC/28G (B-D INSULIN SYRINGE 1CC/28G) 28G X 1/2" 1 ML MISC USE TO ADMINISTER INSULIN DAILY  . insulin NPH-regular Human (70-30) 100 UNIT/ML injection Inject 23 Units into the skin in the morning and at bedtime.   [name] latanoprost (XALATAN) 0.005 % ophthalmic solution Place 2 drops into both eyes at bedtime.   . metoprolol succinate (TOPROL-XL) 100 MG 24 hr tablet TAKE 1 TABLET BY MOUTH EVERY DAY WITH OR IMMEDIATELY FOLLOWING A MEAL  . Multiple Vitamin (MULTIVITAMIN WITH MINERALS) TABS tablet Take 1 tablet by mouth daily.  . nitroGLYCERIN (NITROSTAT) 0.4 MG SL tablet Place 1 tablet (0.4 mg total) under the tongue every 5 (five) minutes as needed for chest pain. MAXIMUM 3 TABLETS  . ondansetron (ZOFRAN) 4 MG tablet Take 1 tablet (4 mg total) by mouth every 6 (six) hours as needed for nausea.  . ONE TOUCH ULTRA TEST test strip USE 1 STRIP TO TEST THREE TIMES DAILY AS DIRECTED  . ONETOUCH DELICA LANCETS 70W MISC Use three times daily to check blood sugar.  . oxyCODONE (OXY  IR/ROXICODONE) 5 MG immediate release tablet Take 1 tablet (  5 mg total) by mouth every 4 (four) hours as needed for moderate pain or severe pain.  . pantoprazole (PROTONIX) 40 MG tablet Take 40 mg by mouth daily.  . tamsulosin (FLOMAX) 0.4 MG CAPS capsule TAKE 1 CAPSULE(0.4 MG) BY MOUTH DAILY  . timolol (TIMOPTIC) 0.5 % ophthalmic solution Place 2 drops into both eyes daily.   . vitamin B-12 (CYANOCOBALAMIN) 500 MCG tablet Take 500 mcg by mouth daily.  . vitamin E 180 MG (400 UNITS) capsule Take 400 Units by mouth daily.  . [DISCONTINUED] rivaroxaban (XARELTO) 10 MG TABS tablet Take 1 tablet (10 mg total) by mouth daily with breakfast.     Review of Systems  All other systems reviewed and are otherwise negative except as noted above.  Physical Exam    VS:  BP 122/66 (BP Location: Left Arm, Patient Position: Sitting, Cuff Size: Normal)   Pulse 90   Ht '5\' 11"'  (1.803 m)   Wt 203 lb (92.1 kg)   SpO2 92%   BMI 28.31 kg/m  , BMI Body mass index is 28.31 kg/m. GEN: Well nourished, well developed, in no acute distress. HEENT: normal. Neck: Supple, no JVD, carotid bruits, or masses. Cardiac: RRR, no murmurs, rubs, or gallops. No clubbing, cyanosis, edema.  Radials/DP/PT 2+ and equal bilaterally.  Respiratory:  Respirations regular and unlabored, clear to auscultation bilaterally. GI: Soft, nontender, nondistended, BS + x 4. MS: No deformity or atrophy. Skin: Warm and dry, no rash. Neuro:  Strength and sensation are intact. Psych: Normal affect.  Assessment & Plan    1. HFrEF - Echo 11/14/20 with newly reduced LVEF 35%. Endorses LE edema and DOE. PCP has started Lasix with some improvement in symptoms. Recommend cardiac catheterization for assessment of ischemia. The patient understands that risks include but are not limited to stroke (1 in 1000), death (1 in 58), kidney failure [usually temporary] (1 in 500), bleeding (1 in 200), allergic reaction [possibly serious] (1 in 200), and agrees  to proceed. Further titration of GDMT pending results of cardiac catheterization. Present GDMT includes Metoprlol Succinate, Furosemide. Ideally, will be able to start Entresto 24-73m BID at time of cardiac cath or at follow up.   2. Coronary artery calcification on CT - Plan for cardiac catheterization, as above. Endorses dyspnea on exertion. No chest pain, pressure, tightness. GDMT includes beta blocker, statin.  3. PVD/Carotid artery stenosis - 03/2020 carotid duplex with bilateral 40-59% stenosis.  Continue to follow with Dr. DLucky Cowboyvascular services.  4. Hx of CVA - No recurrent symptoms suggestive of CVA.  Continue atorvastatin and aspirin.  Disposition: Cardiac cath 11/24/2020. Follow up in 2 week(s) after cardiac catheterization with Dr. GRockey Situor APP.   CLoel Dubonnet NP 11/17/2020, 4:14 PM

## 2020-11-17 NOTE — Progress Notes (Signed)
Office Visit    Patient Name: Henry Lindsey Date of Encounter: 11/17/2020  Primary Care Provider:  Baxter Hire, MD Primary Cardiologist:  Ida Rogue, MD Electrophysiologist:  None   Chief Complaint    Henry Lindsey is a 87 y.o. male with a hx of HLD, aortic atherosclerosis, coronary artery calcification on CT, prior tobacco use presents today for follow-up after echocardiogram  Past Medical History    Past Medical History:  Diagnosis Date  . Arthritis   . BPH (benign prostatic hyperplasia)   . Carotid artery stenosis 11/2010   <50% bilaterally  . COPD (chronic obstructive pulmonary disease) ([place])    noted CT 10/05/16 former smoker quit age 77   . CVA (cerebral infarction) 11/2010   r thalamic lacunar  . Diabetes mellitus   . GERD (gastroesophageal reflux disease)   . Hyperlipidemia   . Hypertension   . Neuromuscular disorder ([place])   . Peripheral vascular disease in diabetes mellitus ([place]) 06/2012    95% occlusion s/p PTCA R SFA Dew Sept 2013  . Stroke [facility])    Past Surgical History:  Procedure Laterality Date  . CHOLECYSTECTOMY N/A 11/13/2016   Procedure: LAPAROSCOPIC CHOLECYSTECTOMY WITH INTRAOPERATIVE CHOLANGIOGRAM;  Surgeon: Olean Ree, MD;  Location: [facility] ORS;  Service: General;  Laterality: N/A;  . COLONOSCOPY WITH PROPOFOL N/A 02/10/2018   Procedure: COLONOSCOPY WITH PROPOFOL;  Surgeon: Lucilla Lame, MD;  Location: [facility] ENDOSCOPY;  Service: Endoscopy;  Laterality: N/A;  . ERCP N/A 11/17/2016   Procedure: ENDOSCOPIC RETROGRADE CHOLANGIOPANCREATOGRAPHY (ERCP);  Surgeon: Irene Shipper, MD;  Location: [facility] ENDOSCOPY;  Service: Endoscopy;  Laterality: N/A;  . ERCP N/A 02/04/2017   Procedure: ENDOSCOPIC RETROGRADE CHOLANGIOPANCREATOGRAPHY (ERCP) Stent removal;  Surgeon: Lucilla Lame, MD;  Location: [facility] ENDOSCOPY;  Service: Endoscopy;  Laterality: N/A;  . IR GENERIC HISTORICAL  10/06/2016   IR PERC CHOLECYSTOSTOMY 10/06/2016 Aletta Edouard, MD [facility]-INTERV RAD  .  JOINT REPLACEMENT Left 2014   left knee  . TOTAL KNEE ARTHROPLASTY Right 08/08/2020   Procedure: TOTAL KNEE ARTHROPLASTY;  Surgeon: Lovell Sheehan, MD;  Location: [facility] ORS;  Service: Orthopedics;  Laterality: Right;  . UPPER GASTROINTESTINAL ENDOSCOPY    . WRIST SURGERY     Allergies  Allergies  Allergen Reactions  . Metformin And Related Diarrhea    History of Present Illness    Henry Lindsey is a 87 y.o. male with a hx of HLD, aortic atherosclerosis, coronary artery calcification on CT, prior tobacco use last seen 07/20/20.  CT scan December 2017 with moderate diffuse aortic atherosclerosis as well as moderate diffuse three-vessel coronary disease.  He was seen by Dr. Rockey Situ April 2021 for preoperative clearance for knee surgery and deemed acceptable risk for the planned procedure.  He was seen 07/20/20 due to bilateral upper extremity numbness for which he was working with physical therapy and Ortho. He reported no anginal symptoms. He is very active mowing both his and his girlfriend's yard with a push mower as well as splitting his own wood.  Seen by PCP 10/03/20 diagnosed on bronchitis started on doxycycline and muccinex. He had chest x-ray which showed pneumonia as well as bilateral pleural effusions. He was seen 10/17/20 by PCP with worsening bilateral pleural effusions and pulmonary vascular congestion on CXR as well as lower extremity edema. He was started on Lasix 44m BID x3 days then QD and recommended for echocardiogram. At follow up with PCP 10/25/20 his Lasix was reduced to 242mdaily as he had  good response. Echocardiogram 11/14/20 at [facility] showed LVEF 35%, normal RVSP, moderate MR, mild TR, mild pericardial effusion.   He presents today for follow up. His chief complaint is difficulty walking. Reports weakness in his legs with ambulation that improved after knee surgery and physical therapy but more noticeable after the last few weeks. We reviewed his echocardiogram in  detail. Reports no chest pain, pressure, tightness. No orthopnea, PND. Does note lower extremity edema has improved since PCP started him on Lasix. Reports dyspnea on exertion with more than usual activity.   EKGs/Labs/Other Studies Reviewed:   The following studies were reviewed today:  Echo 11/14/20 ECHOCARDIOGRAPHIC DESCRIPTIONS  AORTIC ROOT                   Size: Normal             Dissection: INDETERM FOR DISSECTION  AORTIC VALVE               Leaflets: Tricuspid                   Morphology: Normal               Mobility: Fully mobile  LEFT VENTRICLE                   Size: MILDLY ENLARGED               Anterior: HYPOCONTRACTILE            Contraction: REGIONALLY IMPAIRED            Lateral: Normal             Closest EF: 35% (Estimated)                 Septal: HYPOCONTRACTILE              LV Masses: No Masses                       Apical: HYPOCONTRACTILE                    LVH: None                          Inferior: HYPOCONTRACTILE                                                      Posterior: HYPOCONTRACTILE           Dias.FxClass: (Grade 1) relaxation abnormal, E/A reversal  MITRAL VALVE               Leaflets: Normal                        Mobility: Fully mobile             Morphology: Normal  LEFT ATRIUM                   Size: MODERATELY ENLARGED          LA Masses: No masses              IA Septum: Normal IAS  MAIN PA  Size: Normal  PULMONIC VALVE             Morphology: Normal                        Mobility: Fully mobile  RIGHT VENTRICLE                   Size: MILDLY ENLARGED              Free Wall: Normal            Contraction: Normal                       RV Masses: No Masses  TRICUSPID VALVE               Leaflets: Normal                        Mobility: Fully mobile             Morphology: Normal  RIGHT ATRIUM                   Size: MILDLY ENLARGED               RA Other: None                RA Mass: No masses  PERICARDIUM                  Fluid: MILD EFFUSION UNIFORM                 Fibrin: FIBRIN  INFERIOR VENACAVA                   Size: DILATED ABNORMAL RESPIRATORY COLLAPSE  _________________________________________________________________________________________   DOPPLER ECHO and OTHER SPECIAL PROCEDURES                 Aortic: No AR                      No AS                         95.4 cm/sec peak vel       3.6 mmHg peak grad                         2.4 mmHg mean grad         2.9 cm^2 by DOPPLER                 Mitral: MODERATE MR                No MS                         MV Inflow E Vel = 97.7 cm/sec      MV Annulus E'Vel = nm*                         E/E'Ratio = nm*              Tricuspid: MILD TR                    No TS  328.5 cm/sec peak TR vel   58.2 mmHg peak RV pressure              Pulmonary: MILD PR                    No PS  _________________________________________________________________________________________  INTERPRETATION  MODERATE LV SYSTOLIC DYSFUNCTION (See above)  NORMAL RIGHT VENTRICULAR SYSTOLIC FUNCTION  MODERATE VALVULAR REGURGITATION (See above)  NO VALVULAR STENOSIS  Closest EF: 35% (Estimated)  Contraction: REGIONALLY IMPAIRED  Mitral: MODERATE MR  Tricuspid: MILD TR  Fluid: MILD EFFUSION  Fluid: UNIFORM   EKG:  EKG is ordered today.  The ekg ordered today demonstrates NSR 90 bpm with fusion complexes and occasional PVC.  No acute ST/T wave changes.  Recent Labs: 08/09/2020: BUN 23; Creatinine, Ser 1.11; Hemoglobin 12.6; Platelets 212; Potassium 3.8; Sodium 140  Recent Lipid Panel    Component Value Date/Time   CHOL 107 12/24/2018 1146   TRIG 164.0 (H) 12/24/2018 1146   HDL 35.90 (L) 12/24/2018 1146   CHOLHDL 3 12/24/2018 1146   VLDL 32.8 12/24/2018 1146   LDLCALC 38 12/24/2018 1146   LDLDIRECT 62.0 10/04/2016 0942   Home Medications   Current Meds  Medication Sig  . ascorbic acid (VITAMIN C) 500 MG tablet Take 500 mg by mouth daily.   [name]  atorvastatin (LIPITOR) 40 MG tablet Take 1 tablet (40 mg total) by mouth daily.  . Blood Glucose Monitoring Suppl (ONE TOUCH ULTRA SYSTEM KIT) w/Device KIT 1 kit by Does not apply route once. Use DX code E11.59  . cholecalciferol (VITAMIN D) 25 MCG (1000 UNIT) tablet Take 1,000 Units by mouth daily.  . cholestyramine (QUESTRAN) 4 g packet Take 1 packet (4 g total) by mouth 3 (three) times daily.  . cilostazol (PLETAL) 100 MG tablet TAKE 1 TABLET BY MOUTH TWICE DAILY  . diclofenac Sodium (VOLTAREN) 1 % GEL Apply 2 g topically 2 (two) times daily as needed (pain).  [name] docusate sodium (COLACE) 100 MG capsule Take 1 capsule (100 mg total) by mouth 2 (two) times daily.  . furosemide (LASIX) 20 MG tablet Take 20 mg by mouth daily.  [name] gabapentin (NEURONTIN) 400 MG capsule TAKE 1 CAPSULE(400 MG) BY MOUTH THREE TIMES DAILY  . glipiZIDE (GLUCOTROL) 10 MG tablet TAKE 1 TABLET(10 MG) BY MOUTH TWICE DAILY BEFORE A MEAL  . INS SYRINGE/NEEDLE 1CC/28G (B-D INSULIN SYRINGE 1CC/28G) 28G X 1/2" 1 ML MISC USE TO ADMINISTER INSULIN DAILY  . insulin NPH-regular Human (70-30) 100 UNIT/ML injection Inject 23 Units into the skin in the morning and at bedtime.   [name] latanoprost (XALATAN) 0.005 % ophthalmic solution Place 2 drops into both eyes at bedtime.   . metoprolol succinate (TOPROL-XL) 100 MG 24 hr tablet TAKE 1 TABLET BY MOUTH EVERY DAY WITH OR IMMEDIATELY FOLLOWING A MEAL  . Multiple Vitamin (MULTIVITAMIN WITH MINERALS) TABS tablet Take 1 tablet by mouth daily.  . nitroGLYCERIN (NITROSTAT) 0.4 MG SL tablet Place 1 tablet (0.4 mg total) under the tongue every 5 (five) minutes as needed for chest pain. MAXIMUM 3 TABLETS  . ondansetron (ZOFRAN) 4 MG tablet Take 1 tablet (4 mg total) by mouth every 6 (six) hours as needed for nausea.  . ONE TOUCH ULTRA TEST test strip USE 1 STRIP TO TEST THREE TIMES DAILY AS DIRECTED  . ONETOUCH DELICA LANCETS 19J MISC Use three times daily to check blood sugar.  . oxyCODONE (OXY  IR/ROXICODONE) 5 MG immediate release tablet Take 1 tablet (  5 mg total) by mouth every 4 (four) hours as needed for moderate pain or severe pain.  . pantoprazole (PROTONIX) 40 MG tablet Take 40 mg by mouth daily.  . tamsulosin (FLOMAX) 0.4 MG CAPS capsule TAKE 1 CAPSULE(0.4 MG) BY MOUTH DAILY  . timolol (TIMOPTIC) 0.5 % ophthalmic solution Place 2 drops into both eyes daily.   . vitamin B-12 (CYANOCOBALAMIN) 500 MCG tablet Take 500 mcg by mouth daily.  . vitamin E 180 MG (400 UNITS) capsule Take 400 Units by mouth daily.  . [DISCONTINUED] rivaroxaban (XARELTO) 10 MG TABS tablet Take 1 tablet (10 mg total) by mouth daily with breakfast.     Review of Systems  All other systems reviewed and are otherwise negative except as noted above.  Physical Exam    VS:  BP 122/66 (BP Location: Left Arm, Patient Position: Sitting, Cuff Size: Normal)   Pulse 90   Ht '5\' 11"'  (1.803 m)   Wt 203 lb (92.1 kg)   SpO2 92%   BMI 28.31 kg/m  , BMI Body mass index is 28.31 kg/m. GEN: Well nourished, well developed, in no acute distress. HEENT: normal. Neck: Supple, no JVD, carotid bruits, or masses. Cardiac: RRR, no murmurs, rubs, or gallops. No clubbing, cyanosis, edema.  Radials/DP/PT 2+ and equal bilaterally.  Respiratory:  Respirations regular and unlabored, clear to auscultation bilaterally. GI: Soft, nontender, nondistended, BS + x 4. MS: No deformity or atrophy. Skin: Warm and dry, no rash. Neuro:  Strength and sensation are intact. Psych: Normal affect.  Assessment & Plan    1. HFrEF - Echo 11/14/20 with newly reduced LVEF 35%. Endorses LE edema and DOE. PCP has started Lasix with some improvement in symptoms. Recommend cardiac catheterization for assessment of ischemia. The patient understands that risks include but are not limited to stroke (1 in 1000), death (1 in 59), kidney failure [usually temporary] (1 in 500), bleeding (1 in 200), allergic reaction [possibly serious] (1 in 200), and agrees  to proceed. Further titration of GDMT pending results of cardiac catheterization. Present GDMT includes Metoprlol Succinate, Furosemide. Ideally, will be able to start Entresto 24-55m BID at time of cardiac cath or at follow up.   2. Coronary artery calcification on CT - Plan for cardiac catheterization, as above. Endorses dyspnea on exertion. No chest pain, pressure, tightness. GDMT includes beta blocker, statin.  3. PVD/Carotid artery stenosis - 03/2020 carotid duplex with bilateral 40-59% stenosis.  Continue to follow with Dr. DL[facility]vascular services.  4. Hx of CVA - No recurrent symptoms suggestive of CVA.  Continue atorvastatin and aspirin.  Disposition: Cardiac cath 11/24/2020. Follow up in 2 week(s) after cardiac catheterization with Dr. GRockey Situor APP.   CLoel Dubonnet NP 11/17/2020, 4:14 PM

## 2020-11-17 NOTE — Patient Instructions (Addendum)
Medication Instructions:  No medication changes today.   *If you need a refill on your cardiac medications before your next appointment, please call your pharmacy*  Lab Work: Your provider recommends lab work today: BMP, CBC  If you have labs (blood work) drawn today and your tests are completely normal, you will receive your results only by: [name] MyChart Message (if you have MyChart) OR . A paper copy in the mail If you have any lab test that is abnormal or we need to change your treatment, we will call you to review the results.   Testing/Procedures: Your provider has requested that you have a cardiac catheterization. Cardiac catheterization is used to diagnose and/or treat various heart conditions. Doctors may recommend this procedure for a number of different reasons. Chest pain can be a symptom of coronary artery disease (CAD), and cardiac catheterization can show whether plaque is narrowing or blocking your heart's arteries. This procedure is also used to evaluate the valves, as well as measure the blood flow and oxygen levels in different parts of your heart.  Please follow instruction sheet, as given.   You are scheduled for a Cardiac Catheterization on Friday, February 4 with Dr. Ida Rogue.  1. Please arrive at the [place] [facility] at 7:00 AM (This time is one hour before your procedure to ensure your preparation). Free valet parking service is available.   2. Diet: Do not eat solid foods after midnight.  The patient may have clear liquids until 5am upon the day of the procedure.  3. Labs: You will need to have blood drawn today in clinic for BMP and CBC.  COVID PRE- TEST: You will need a COVID TEST prior to the procedure:  LOCATION: [facility] site.  DATE/TIME:  Wednesday 11/22/20 (anytime between 8 am and 1 pm)   4. Medication instructions in preparation for your procedure:   Contrast Allergy: No  Do Not take fluid pills  (Furosemide) the MORNING of your procedure.  Please only take HALF dose of your Insulin the night before your procedure.  Do Not take any insulin the MORNING of your procedure.   Do Not take any oral diabetic medications the MORNING of your procedure: Glipizide   You MAY take your REMAINING morning medications with a sip of water.  5. Plan for one night stay--bring personal belongings. 6. Bring a current list of your medications and current insurance cards. 7. You MUST have a responsible person to drive you home. 8. Someone MUST be with you the first 24 hours after you arrive home or your discharge will be delayed. 9. Please wear clothes that are easy to get on and off and wear slip-on shoes.  Thank you for allowing [place] to care for you!   -- [place] [facility]     Follow-Up: At [facility], you and your health needs are our priority.  As part of our continuing mission to provide you with exceptional heart care, we have created designated Provider Care Teams.  These Care Teams include your primary Cardiologist (physician) and Advanced Practice Providers (APPs -  Physician Assistants and Nurse Practitioners) who all work together to provide you with the care you need, when you need it.  We recommend signing up for the patient portal called "MyChart".  Sign up information is provided on this After Visit Summary.  MyChart is used to connect with patients for Virtual Visits (Telemedicine).  Patients are able to view lab/test  results, encounter notes, upcoming appointments, etc.  Non-urgent messages can be sent to your provider as well.   To learn more about what you can do with MyChart, go to [email or web address].    Your next appointment:   2 weeks after cardiac catheterization with Dr. Rockey Situ  Other Instructions   Recommend drinking less than 2 liters of fluid per day  Recommend a low salt diet  Heart Failure, Diagnosis  Heart failure is a condition  in which the heart has trouble pumping blood. This may mean that the heart cannot pump enough blood out to the body or that the heart does not fill up with enough blood. For some people with heart failure, fluid may back up into the lungs. There may also be swelling (edema) in the lower legs. Heart failure is usually a long-term (chronic) condition. It is important for you to take good care of yourself and follow the treatment plan from your health care provider. What are the causes? This condition may be caused by:  High blood pressure (hypertension). Hypertension causes the heart muscle to work harder than normal.  Coronary artery disease, or CAD. CAD is the buildup of cholesterol and fat (plaque) in the arteries of the heart.  Heart attack, also called myocardial infarction. This injures the heart muscle, making it hard for the heart to pump blood.  Abnormal heart valves. The valves do not open and close properly, forcing the heart to pump harder to keep the blood flowing.  Heart muscle disease, inflammation, or infection (cardiomyopathy or myocarditis). This is damage to the heart muscle. It can increase the risk of heart failure.  Lung disease. The heart works harder when the lungs are not healthy. What increases the risk? The risk of heart failure increases as a person ages. This condition is also more likely to develop in people who:  Are obese.  Are male.  Use tobacco or nicotine products.  Abuse alcohol or drugs.  Have taken medicines that can damage the heart, such as chemotherapy drugs.  Have any of these conditions: ? Diabetes. ? Abnormal heart rhythms. ? Thyroid problems. ? Low blood counts (anemia). ? Chronic kidney disease.  Have a family history of heart failure. What are the signs or symptoms? Symptoms of this condition include:  Shortness of breath with activity, such as when climbing stairs.  A cough that does not go away.  Swelling of the feet, ankles,  legs, or abdomen.  Losing or gaining weight for no reason.  Trouble breathing when lying flat.  Waking from sleep because of the need to sit up and get more air.  Rapid heartbeat.  Tiredness (fatigue) and loss of energy.  Feeling light-headed, dizzy, or close to fainting.  Nausea or loss of appetite.  Waking up more often during the night to urinate (nocturia).  Confusion. How is this diagnosed? This condition is diagnosed based on:  Your medical history, symptoms, and a physical exam.  Diagnostic tests, which may include: ? Echocardiogram. ? Electrocardiogram (ECG). ? Chest X-ray. ? Blood tests. ? Exercise stress test. ? Cardiac MRI. ? Cardiac catheterization and angiogram. ? Radionuclide scans. How is this treated? Treatment for this condition is aimed at managing the symptoms of heart failure. Medicines Treatment may include medicines that:  Help lower blood pressure by relaxing (dilating) the blood vessels. These medicines are called ACE inhibitors (angiotensin-converting enzyme), ARBs (angiotensin receptor blockers), or vasodilators.  Cause the kidneys to remove salt and water from the blood through urination (diuretics).  Improve heart muscle strength and prevent the heart from beating too fast (beta blockers).  Increase the force of the heartbeat (digoxin).  Lower heart rates. Certain diabetes medicines (SGLT-2 inhibitors) may also be used in treatment. Healthy behavior changes Treatment may also include making healthy lifestyle changes, such as:  Reaching and staying at a healthy weight.  Not using tobacco or nicotine products.  Eating heart-healthy foods.  Limiting or avoiding alcohol.  Stopping the use of illegal drugs.  Being physically active.  Participating in a cardiac rehabilitation program, which is a treatment program to improve your health and well-being through exercise training, education, and counseling. Other treatments Other  treatments may include:  Procedures to open blocked arteries or repair damaged valves.  Placing a pacemaker to improve heart function (cardiac resynchronization therapy).  Placing a device to treat serious abnormal heart rhythms (implantable cardioverter defibrillator, or ICD).  Placing a device to improve the pumping ability of the heart (left ventricular assist device, or LVAD).  Receiving a healthy heart from a donor (heart transplant). This is done when other treatments have not helped. Follow these instructions at home:  Manage other health conditions as told by your health care provider. These may include hypertension, diabetes, thyroid disease, or abnormal heart rhythms.  Get ongoing education and support as needed. Learn as much as you can about heart failure.  Keep all follow-up visits. This is important. Summary  Heart failure is a condition in which the heart has trouble pumping blood.  This condition is commonly caused by high blood pressure and other diseases of the heart and lungs.  Symptoms of this condition include shortness of breath, tiredness (fatigue), nausea, and swelling of the feet, ankles, legs, or abdomen.  Treatments for this condition may include medicines, lifestyle changes, and surgery.  Manage other health conditions as told by your health care provider. This information is not intended to replace advice given to you by your health care provider. Make sure you discuss any questions you have with your health care provider. Document Revised: 04/29/2020 Document Reviewed: 04/29/2020 Elsevier Patient Education  [place].

## 2020-11-18 LAB — CBC
Hematocrit: 37.4 % — ABNORMAL LOW (ref 37.5–51.0)
Hemoglobin: 12 g/dL — ABNORMAL LOW (ref 13.0–17.7)
MCH: 25.9 pg — ABNORMAL LOW (ref 26.6–33.0)
MCHC: 32.1 g/dL (ref 31.5–35.7)
MCV: 81 fL (ref 79–97)
Platelets: 242 10*3/uL (ref 150–450)
RBC: 4.63 x10E6/uL (ref 4.14–5.80)
RDW: 14.4 % (ref 11.6–15.4)
WBC: 6.7 10*3/uL (ref 3.4–10.8)

## 2020-11-18 LAB — BASIC METABOLIC PANEL
BUN/Creatinine Ratio: 14 (ref 10–24)
BUN: 17 mg/dL (ref 8–27)
CO2: 21 mmol/L (ref 20–29)
Calcium: 9.1 mg/dL (ref 8.6–10.2)
Chloride: 107 mmol/L — ABNORMAL HIGH (ref 96–106)
Creatinine, Ser: 1.21 mg/dL (ref 0.76–1.27)
GFR calc Af Amer: 63 mL/min/{1.73_m2} (ref >59)
GFR calc non Af Amer: 54 mL/min/{1.73_m2} — ABNORMAL LOW (ref >59)
Glucose: 147 mg/dL — ABNORMAL HIGH (ref 65–99)
Potassium: 4.6 mmol/L (ref 3.5–5.2)
Sodium: 142 mmol/L (ref 134–144)

## 2020-11-22 ENCOUNTER — Other Ambulatory Visit
Admission: RE | Admit: 2020-11-22 | Discharge: 2020-11-22 | Disposition: A | Discharge status: Home / Self Care | Payer: Medicare Other | Source: Ambulatory Visit | Attending: Cardiovascular Disease | Admitting: Cardiovascular Disease

## 2020-11-22 ENCOUNTER — Other Ambulatory Visit: Payer: Self-pay

## 2020-11-22 DIAGNOSIS — Z20822 Contact with and (suspected) exposure to covid-19: Secondary | ICD-10-CM | POA: Diagnosis not present

## 2020-11-22 DIAGNOSIS — Z01812 Encounter for preprocedural laboratory examination: Secondary | ICD-10-CM | POA: Insufficient documentation

## 2020-11-22 LAB — SARS CORONAVIRUS 2 (TAT 6-24 HRS): SARS Coronavirus 2: NEGATIVE

## 2020-11-24 ENCOUNTER — Ambulatory Visit
Admission: RE | Admit: 2020-11-24 | Discharge: 2020-11-24 | Disposition: A | Discharge status: Home / Self Care | Payer: Medicare Other | Attending: Cardiovascular Disease | Admitting: Cardiovascular Disease

## 2020-11-24 ENCOUNTER — Telehealth: Payer: Self-pay | Admitting: *Deleted

## 2020-11-24 ENCOUNTER — Other Ambulatory Visit: Payer: Self-pay

## 2020-11-24 ENCOUNTER — Encounter: Payer: Self-pay | Admitting: Cardiovascular Disease

## 2020-11-24 ENCOUNTER — Encounter
Admission: RE | Disposition: A | Discharge status: Home / Self Care | Payer: Self-pay | Source: Home / Self Care | Attending: Cardiovascular Disease

## 2020-11-24 DIAGNOSIS — Z7984 Long term (current) use of oral hypoglycemic drugs: Secondary | ICD-10-CM | POA: Insufficient documentation

## 2020-11-24 DIAGNOSIS — I5020 Unspecified systolic (congestive) heart failure: Secondary | ICD-10-CM | POA: Insufficient documentation

## 2020-11-24 DIAGNOSIS — I25119 Atherosclerotic heart disease of native coronary artery with unspecified angina pectoris: Secondary | ICD-10-CM | POA: Diagnosis not present

## 2020-11-24 DIAGNOSIS — Z794 Long term (current) use of insulin: Secondary | ICD-10-CM | POA: Diagnosis not present

## 2020-11-24 DIAGNOSIS — E1151 Type 2 diabetes mellitus with diabetic peripheral angiopathy without gangrene: Secondary | ICD-10-CM | POA: Insufficient documentation

## 2020-11-24 DIAGNOSIS — I5043 Acute on chronic combined systolic (congestive) and diastolic (congestive) heart failure: Secondary | ICD-10-CM | POA: Diagnosis not present

## 2020-11-24 DIAGNOSIS — I25118 Atherosclerotic heart disease of native coronary artery with other forms of angina pectoris: Secondary | ICD-10-CM

## 2020-11-24 DIAGNOSIS — I200 Unstable angina: Secondary | ICD-10-CM

## 2020-11-24 DIAGNOSIS — IMO0002 Reserved for concepts with insufficient information to code with codable children: Secondary | ICD-10-CM

## 2020-11-24 DIAGNOSIS — I110 Hypertensive heart disease with heart failure: Secondary | ICD-10-CM | POA: Diagnosis not present

## 2020-11-24 DIAGNOSIS — I255 Ischemic cardiomyopathy: Secondary | ICD-10-CM

## 2020-11-24 DIAGNOSIS — I2510 Atherosclerotic heart disease of native coronary artery without angina pectoris: Secondary | ICD-10-CM | POA: Insufficient documentation

## 2020-11-24 DIAGNOSIS — E1165 Type 2 diabetes mellitus with hyperglycemia: Secondary | ICD-10-CM

## 2020-11-24 DIAGNOSIS — I6523 Occlusion and stenosis of bilateral carotid arteries: Secondary | ICD-10-CM | POA: Diagnosis not present

## 2020-11-24 DIAGNOSIS — Z79899 Other long term (current) drug therapy: Secondary | ICD-10-CM | POA: Diagnosis not present

## 2020-11-24 DIAGNOSIS — I209 Angina pectoris, unspecified: Secondary | ICD-10-CM

## 2020-11-24 HISTORY — PX: RIGHT/LEFT HEART CATH AND CORONARY ANGIOGRAPHY: CATH118266

## 2020-11-24 LAB — GLUCOSE, CAPILLARY
Glucose-Capillary: 94 mg/dL (ref 70–99)
Glucose-Capillary: 78 mg/dL (ref 70–99)

## 2020-11-24 SURGERY — RIGHT/LEFT HEART CATH AND CORONARY ANGIOGRAPHY
Anesthesia: Moderate Sedation

## 2020-11-24 MED ORDER — ASPIRIN 81 MG PO CHEW
CHEWABLE_TABLET | ORAL | Status: AC
  Filled 2020-11-24: qty 1

## 2020-11-24 MED ORDER — SODIUM CHLORIDE 0.9 % IV SOLN: INTRAVENOUS | Status: DC

## 2020-11-24 MED ORDER — SODIUM CHLORIDE 0.9% FLUSH: 3.0000 mL | Freq: Two times a day (BID) | INTRAVENOUS | Status: DC

## 2020-11-24 MED ORDER — MIDAZOLAM HCL 2 MG/2ML IJ SOLN
INTRAMUSCULAR | Status: DC | PRN
  Administered 2020-11-24: 1 mg via INTRAVENOUS
  Administered 2020-11-24: 0.5 mg via INTRAVENOUS

## 2020-11-24 MED ORDER — HEPARIN (PORCINE) IN NACL 1000-0.9 UT/500ML-% IV SOLN
INTRAVENOUS | Status: AC
  Filled 2020-11-24: qty 1000

## 2020-11-24 MED ORDER — IOHEXOL 300 MG/ML  SOLN
INTRAMUSCULAR | Status: DC | PRN
  Administered 2020-11-24: 140 mL

## 2020-11-24 MED ORDER — ASPIRIN 81 MG PO CHEW: 81.0000 mg | CHEWABLE_TABLET | ORAL | Status: DC

## 2020-11-24 MED ORDER — FENTANYL CITRATE (PF) 100 MCG/2ML IJ SOLN
INTRAMUSCULAR | Status: AC
  Filled 2020-11-24: qty 2

## 2020-11-24 MED ORDER — HEPARIN (PORCINE) IN NACL 1000-0.9 UT/500ML-% IV SOLN
INTRAVENOUS | Status: DC | PRN
  Administered 2020-11-24: 500 mL

## 2020-11-24 MED ORDER — LIDOCAINE HCL (PF) 1 % IJ SOLN
INTRAMUSCULAR | Status: DC | PRN
  Administered 2020-11-24: 20 mL

## 2020-11-24 MED ORDER — SODIUM CHLORIDE 0.9 % IV SOLN: 250.0000 mL | INTRAVENOUS | Status: DC | PRN

## 2020-11-24 MED ORDER — SODIUM CHLORIDE 0.9% FLUSH: 3.0000 mL | INTRAVENOUS | Status: DC | PRN

## 2020-11-24 MED ORDER — MIDAZOLAM HCL 2 MG/2ML IJ SOLN
INTRAMUSCULAR | Status: AC
  Filled 2020-11-24: qty 2

## 2020-11-24 MED ORDER — LIDOCAINE HCL (PF) 1 % IJ SOLN
INTRAMUSCULAR | Status: AC
  Filled 2020-11-24: qty 30

## 2020-11-24 MED ORDER — FENTANYL CITRATE (PF) 100 MCG/2ML IJ SOLN
INTRAMUSCULAR | Status: DC | PRN
  Administered 2020-11-24 (×2): 25 ug via INTRAVENOUS

## 2020-11-24 SURGICAL SUPPLY — 12 items
CATH INFINITI 5FR ANG PIGTAIL (CATHETERS) ×2 IMPLANT
CATH INFINITI 5FR JL4 (CATHETERS) ×2 IMPLANT
CATH INFINITI 5FR JL5 (CATHETERS) ×2 IMPLANT
CATH INFINITI JR4 5F (CATHETERS) ×2 IMPLANT
CATH SWAN GANZ 7F STRAIGHT (CATHETERS) ×2 IMPLANT
GUIDEWIRE EMER 3M J .025X150CM (WIRE) ×2 IMPLANT
KIT MANI 3VAL PERCEP (MISCELLANEOUS) ×2 IMPLANT
NEEDLE PERC 18GX7CM (NEEDLE) ×2 IMPLANT
PACK CARDIAC CATH (CUSTOM PROCEDURE TRAY) ×2 IMPLANT
SHEATH AVANTI 5FR X 11CM (SHEATH) ×2 IMPLANT
SHEATH AVANTI 7FRX11 (SHEATH) ×2 IMPLANT
WIRE GUIDERIGHT .035X150 (WIRE) ×2 IMPLANT

## 2020-11-24 NOTE — Telephone Encounter (Signed)
Called and spoke with staff at [facility]. I was advised to staff message Micki Riley (referral coordinator) & Levonne Spiller (clinical manager) with the patient's info/ reason for consult and they will schedule the patient.  A staff message has been sent as requested above.

## 2020-11-24 NOTE — Telephone Encounter (Signed)
-----   Message from Emily Filbert, RN sent at 11/24/2020  1:10 PM EST -----  ----- Message ----- From: Minna Merritts, MD Sent: 11/24/2020  10:57 AM EST To: Wynema Birch, RN  Please place order for CT surgery evaluation for CABG in [facility] Cardiac catheterization done February 4 showing occluded RCA, severe ostial LAD disease among other lesions Would see if they can schedule ASAP Thx TG

## 2020-11-24 NOTE — Telephone Encounter (Signed)
-----   Message from Laury Deep, RN sent at 11/24/2020  2:25 PM EST ----- Thanks, Dr. Rockey Situ secure chatted me today and patient is already scheduled to see Bartle on Wednesday.  Thanks-Ryan ----- Message ----- From: Emily Filbert, RN Sent: 11/24/2020   2:21 PM EST To: Jerrilyn Cairo, RN, #  Hello!  Patient had a cardiac cath today at [facility] with Dr. Rockey Situ. Message received from Dr. Rockey Situ as below:  "Please place order for CT surgery evaluation for CABG in [facility]  Cardiac catheterization done February 4 showing occluded RCA, severe ostial LAD disease among other lesions  Would see if they can schedule ASAP  Thx  TG "   The referral has been placed. Please advise on an appointment for the patient and let me know if I need to call him with this.  Thank you! Alvis Lemmings, RN   [facility]  Phone: [phone]

## 2020-11-24 NOTE — Discharge Instructions (Signed)
Femoral Site Care  This sheet gives you information about how to care for yourself after your procedure. Your health care provider may also give you more specific instructions. If you have problems or questions, contact your health care provider. What can I expect after the procedure? After the procedure, it is common to have:  Bruising that usually fades within 1-2 weeks.  Tenderness at the site. Follow these instructions at home: Wound care  Follow instructions from your health care provider about how to take care of your insertion site. Make sure you: ? Wash your hands with soap and water before you change your bandage (dressing). If soap and water are not available, use hand sanitizer. ? Change your dressing as told by your health care provider. ? Leave stitches (sutures), skin glue, or adhesive strips in place. These skin closures may need to stay in place for 2 weeks or longer. If adhesive strip edges start to loosen and curl up, you may trim the loose edges. Do not remove adhesive strips completely unless your health care provider tells you to do that.  Do not take baths, swim, or use a hot tub until your health care provider approves.  You may shower 24-48 hours after the procedure or as told by your health care provider. ? Gently wash the site with plain soap and water. ? Pat the area dry with a clean towel. ? Do not rub the site. This may cause bleeding.  Do not apply powder or lotion to the site. Keep the site clean and dry.  Check your femoral site every day for signs of infection. Check for: ? Redness, swelling, or pain. ? Fluid or blood. ? Warmth. ? Pus or a bad smell. Activity  For the first 2-3 days after your procedure, or as long as directed: ? Avoid climbing stairs as much as possible. ? Do not squat.  Do not lift anything that is heavier than 10 lb (4.5 kg), or the limit that you are told, until your health care provider says that it is safe.  Rest as  directed. ? Avoid sitting for a long time without moving. Get up to take short walks every 1-2 hours.  Do not drive for 24 hours if you were given a medicine to help you relax (sedative). General instructions  Take over-the-counter and prescription medicines only as told by your health care provider.  Keep all follow-up visits as told by your health care provider. This is important. Contact a health care provider if you have:  A fever or chills.  You have redness, swelling, or pain around your insertion site. Get help right away if:  The catheter insertion area swells very fast.  You pass out.  You suddenly start to sweat or your skin gets clammy.  The catheter insertion area is bleeding, and the bleeding does not stop when you hold steady pressure on the area.  The area near or just beyond the catheter insertion site becomes pale, cool, tingly, or numb. These symptoms may represent a serious problem that is an emergency. Do not wait to see if the symptoms will go away. Get medical help right away. Call your local emergency services (911 in the [place] Do not drive yourself to the hospital. Summary  After the procedure, it is common to have bruising that usually fades within 1-2 weeks.  Check your femoral site every day for signs of infection.  Do not lift anything that is heavier than 10 lb (4.5 kg), or   the limit that you are told, until your health care provider says that it is safe. This information is not intended to replace advice given to you by your health care provider. Make sure you discuss any questions you have with your health care provider. Document Revised: 06/09/2020 Document Reviewed: 06/09/2020 Elsevier Patient Education  2021 [employer] Moderate Conscious Sedation, Adult, Care After This sheet gives you information about how to care for yourself after your procedure. Your health care provider may also give you more specific instructions. If you have problems  or questions, contact your health care provider. What can I expect after the procedure? After the procedure, it is common to have:  Sleepiness for several hours.  Impaired judgment for several hours.  Difficulty with balance.  Vomiting if you eat too soon. Follow these instructions at home: For the time period you were told by your health care provider:  Rest.  Do not participate in activities where you could fall or become injured.  Do not drive or use machinery.  Do not drink alcohol.  Do not take sleeping pills or medicines that cause drowsiness.  Do not make important decisions or sign legal documents.  Do not take care of children on your own.      Eating and drinking  Follow the diet recommended by your health care provider.  Drink enough fluid to keep your urine pale yellow.  If you vomit: ? Drink water, juice, or soup when you can drink without vomiting. ? Make sure you have little or no nausea before eating solid foods.   General instructions  Take over-the-counter and prescription medicines only as told by your health care provider.  Have a responsible adult stay with you for the time you are told. It is important to have someone help care for you until you are awake and alert.  Do not smoke.  Keep all follow-up visits as told by your health care provider. This is important. Contact a health care provider if:  You are still sleepy or having trouble with balance after 24 hours.  You feel light-headed.  You keep feeling nauseous or you keep vomiting.  You develop a rash.  You have a fever.  You have redness or swelling around the IV site. Get help right away if:  You have trouble breathing.  You have new-onset confusion at home. Summary  After the procedure, it is common to feel sleepy, have impaired judgment, or feel nauseous if you eat too soon.  Rest after you get home. Know the things you should not do after the procedure.  Follow the  diet recommended by your health care provider and drink enough fluid to keep your urine pale yellow.  Get help right away if you have trouble breathing or new-onset confusion at home. This information is not intended to replace advice given to you by your health care provider. Make sure you discuss any questions you have with your health care provider. Document Revised: 02/04/2020 Document Reviewed: 09/02/2019 Elsevier Patient Education  2021 [employer]  

## 2020-11-26 NOTE — H&P (Signed)
H&P Addendum, precardiac catheterization  Patient was seen and evaluated prior to Cardiac catheterization procedure Symptoms, prior testing details again confirmed with the patient Patient examined, no significant change from prior exam Lab work reviewed in detail personally by myself Patient understands risk and benefit of the procedure, willing to proceed  Signed, Tim Virjean Boman, MD, Ph.D [facility]    

## 2020-11-26 NOTE — Interval H&P Note (Signed)
History and Physical Interval Note:  11/26/2020 3:12 PM  Henry Lindsey  has presented today for surgery, with the diagnosis of Right and left heart Cath   HF with reduced EF, ischemic cardiomyopathy, angina.  The various methods of treatment have been discussed with the patient and family. After consideration of risks, benefits and other options for treatment, the patient has consented to  Procedure(s): RIGHT/LEFT HEART CATH AND CORONARY ANGIOGRAPHY (N/A) as a surgical intervention.  The patient's history has been reviewed, patient examined, no change in status, stable for surgery.  I have reviewed the patient's chart and labs.  Questions were answered to the patient's satisfaction.     Ida Rogue

## 2020-11-27 ENCOUNTER — Other Ambulatory Visit: Payer: Self-pay

## 2020-11-27 DIAGNOSIS — I240 Acute coronary thrombosis not resulting in myocardial infarction: Secondary | ICD-10-CM

## 2020-11-27 NOTE — Progress Notes (Signed)
Referral order for cardiothoracic surgery in [facility] placed per Dr. Donivan Scull order d/t evaluation for CABG in [place]. Cardiac catheterization done February 4 showing occluded RCA, severe ostial LAD disease among other lesions. Order placed at this time.

## 2020-11-29 ENCOUNTER — Institutional Professional Consult (permissible substitution) (INDEPENDENT_AMBULATORY_CARE_PROVIDER_SITE_OTHER): Payer: Medicare Other | Admitting: Surgery

## 2020-11-29 ENCOUNTER — Encounter: Payer: Self-pay | Admitting: Surgery

## 2020-11-29 ENCOUNTER — Other Ambulatory Visit: Payer: Self-pay

## 2020-11-29 VITALS — BP 123/76 | HR 88 | Resp 20 | Ht 71.0 in | Wt 203.0 lb

## 2020-11-29 DIAGNOSIS — I2510 Atherosclerotic heart disease of native coronary artery without angina pectoris: Secondary | ICD-10-CM

## 2020-11-29 NOTE — Progress Notes (Signed)
Cardiothoracic Surgery Consultation   PCP is Baxter Hire, MD Referring Provider is Minna Merritts, MD  Chief Complaint  Patient presents with  . Coronary Artery Disease    Initial surgical consult, cardiac cath 11/24/20    HPI:  The patient is an 87 year old gentleman with a history of hypertension, hyperlipidemia, type 2 diabetes, peripheral vascular disease status post PTCA of the right SFA in 2013, cerebrovascular disease with bilateral carotid artery stenosis, and prior stroke in 2012 who said that he was feeling fine and was very active mowing yards and splitting his own wood until he underwent right total knee replacement in October 2021.  He was seen by his PCP in December 2021 and diagnosed with bronchitis and treated with antibiotics and mucolytic's.  A chest x-ray showed probable pneumonia with bilateral pleural effusion.  A follow-up chest x-ray on 10/17/2020 reportedly showed worsening bilateral pleural effusions and pulmonary vascular congestion.  He also developed lower extremity edema and was treated with Lasix with improvement in his lower extremity edema.  The Lasix was reduced to 20 mg daily in early January.  He had an echocardiogram on 11/14/2020 at [facility] clinic which reportedly showed an ejection fraction of 35% with moderate MR, mild TR, and mild pericardial effusion.  He then presented to Dr. Rockey Situ for follow-up.  He underwent cardiac catheterization on 11/24/2020 which showed 60% ostial to mid left main stenosis.  The LAD had 70% ostial stenosis.  There is a moderate sized first diagonal branch that had 80% stenosis.  The left circumflex had a moderate sized second marginal branch with 70% stenosis.  The right coronary artery was occluded proximally with filling of the distal vessel by collaterals from the left.  Left ventricular ejection fraction was 35 to 45% by visual estimate with moderately elevated LVEDP and mild pulmonary hypertension.  The patient is here  today with his daughter and companion.  He tells me that he has never had any chest pain or pressure.  He said that he only developed shortness of breath after he developed pneumonia.  He continues to have some shortness of breath with exertion.  He denies exertional fatigue. He still has some lower extremity edema. Past Medical History:  Diagnosis Date  . Arthritis   . BPH (benign prostatic hyperplasia)   . Carotid artery stenosis 11/2010   <50% bilaterally  . COPD (chronic obstructive pulmonary disease) ([place])    noted CT 10/05/16 former smoker quit age 61   . CVA (cerebral infarction) 11/2010   r thalamic lacunar  . Diabetes mellitus   . GERD (gastroesophageal reflux disease)   . Hyperlipidemia   . Hypertension   . Neuromuscular disorder ([place])   . Peripheral vascular disease in diabetes mellitus ([place]) 06/2012    95% occlusion s/p PTCA R SFA [place] Sept 2013  . Stroke [facility])     Past Surgical History:  Procedure Laterality Date  . CHOLECYSTECTOMY N/A 11/13/2016   Procedure: LAPAROSCOPIC CHOLECYSTECTOMY WITH INTRAOPERATIVE CHOLANGIOGRAM;  Surgeon: Olean Ree, MD;  Location: [facility] ORS;  Service: General;  Laterality: N/A;  . COLONOSCOPY WITH PROPOFOL N/A 02/10/2018   Procedure: COLONOSCOPY WITH PROPOFOL;  Surgeon: Lucilla Lame, MD;  Location: [facility] ENDOSCOPY;  Service: Endoscopy;  Laterality: N/A;  . ERCP N/A 11/17/2016   Procedure: ENDOSCOPIC RETROGRADE CHOLANGIOPANCREATOGRAPHY (ERCP);  Surgeon: Irene Shipper, MD;  Location: [facility] ENDOSCOPY;  Service: Endoscopy;  Laterality: N/A;  . ERCP N/A 02/04/2017   Procedure: ENDOSCOPIC RETROGRADE CHOLANGIOPANCREATOGRAPHY (ERCP) Stent removal;  Surgeon:  Lucilla Lame, MD;  Location: [facility] ENDOSCOPY;  Service: Endoscopy;  Laterality: N/A;  . IR GENERIC HISTORICAL  10/06/2016   IR PERC CHOLECYSTOSTOMY 10/06/2016 Aletta Edouard, MD [facility]-INTERV RAD  . JOINT REPLACEMENT Left 2014   left knee  . RIGHT/LEFT HEART CATH AND CORONARY ANGIOGRAPHY N/A 11/24/2020    Procedure: RIGHT/LEFT HEART CATH AND CORONARY ANGIOGRAPHY;  Surgeon: Minna Merritts, MD;  Location: [place] CV LAB;  Service: Cardiovascular;  Laterality: N/A;  . TOTAL KNEE ARTHROPLASTY Right 08/08/2020   Procedure: TOTAL KNEE ARTHROPLASTY;  Surgeon: Lovell Sheehan, MD;  Location: [facility] ORS;  Service: Orthopedics;  Laterality: Right;  . UPPER GASTROINTESTINAL ENDOSCOPY    . WRIST SURGERY      Family History  Problem Relation Age of Onset  . Diabetes Mother     Social History Social History   Tobacco Use  . Smoking status: Former Smoker    Packs/day: 0.50    Years: 33.00    Pack years: 16.50    Types: Cigarettes    Start date: 06/[birth year]/1935    Quit date: 04/21/1968    Years since quitting: 52.6  . Smokeless tobacco: Never Used  Vaping Use  . Vaping Use: Never used  Substance Use Topics  . Alcohol use: No  . Drug use: No    Current Outpatient Medications  Medication Sig Dispense Refill  . albuterol (VENTOLIN HFA) 108 (90 Base) MCG/ACT inhaler Inhale 2 puffs into the lungs every 6 (six) hours as needed.    [name] ascorbic acid (VITAMIN C) 500 MG tablet Take 500 mg by mouth daily.     [name] atorvastatin (LIPITOR) 40 MG tablet Take 1 tablet (40 mg total) by mouth daily. 90 tablet 0  . Blood Glucose Monitoring Suppl (ONE TOUCH ULTRA SYSTEM KIT) w/Device KIT 1 kit by Does not apply route once. Use DX code E11.59 1 each 0  . cholecalciferol (VITAMIN D) 25 MCG (1000 UNIT) tablet Take 1,000 Units by mouth daily.    . cholestyramine (QUESTRAN) 4 g packet Take 1 packet (4 g total) by mouth 3 (three) times daily. (Patient taking differently: Take 4 g by mouth 2 (two) times daily.) 60 each 12  . cilostazol (PLETAL) 100 MG tablet TAKE 1 TABLET BY MOUTH TWICE DAILY (Patient taking differently: Take 100 mg by mouth 2 (two) times daily.) 180 tablet 0  . diclofenac Sodium (VOLTAREN) 1 % GEL Apply 2 g topically 2 (two) times daily as needed (pain).    . furosemide (LASIX) 20 MG tablet Take 10 mg by  mouth daily.    [name] gabapentin (NEURONTIN) 400 MG capsule TAKE 1 CAPSULE(400 MG) BY MOUTH THREE TIMES DAILY (Patient taking differently: Take 400 mg by mouth 2 (two) times daily.) 270 capsule 0  . glipiZIDE (GLUCOTROL) 10 MG tablet TAKE 1 TABLET(10 MG) BY MOUTH TWICE DAILY BEFORE A MEAL (Patient taking differently: Take 10 mg by mouth 2 (two) times daily before a meal.) 180 tablet 1  . HYDROcodone-acetaminophen (NORCO/VICODIN) 5-325 MG tablet Take 1 tablet by mouth every 6 (six) hours as needed.    . INS SYRINGE/NEEDLE 1CC/28G (B-D INSULIN SYRINGE 1CC/28G) 28G X 1/2" 1 ML MISC USE TO ADMINISTER INSULIN DAILY 100 each 0  . insulin NPH-regular Human (70-30) 100 UNIT/ML injection Inject 23 Units into the skin in the morning and at bedtime.     [name] latanoprost (XALATAN) 0.005 % ophthalmic solution Place 2 drops into both eyes at bedtime.   5  . metoprolol succinate (TOPROL-XL) 100  MG 24 hr tablet TAKE 1 TABLET BY MOUTH EVERY DAY WITH OR IMMEDIATELY FOLLOWING A MEAL (Patient taking differently: Take 100 mg by mouth daily with breakfast. WITH OR IMMEDIATELY FOLLOWING A MEAL) 90 tablet 1  . Multiple Vitamin (MULTIVITAMIN WITH MINERALS) TABS tablet Take 1 tablet by mouth daily.    . nitroGLYCERIN (NITROSTAT) 0.4 MG SL tablet Place 1 tablet (0.4 mg total) under the tongue every 5 (five) minutes as needed for chest pain. MAXIMUM 3 TABLETS 50 tablet 3  . ondansetron (ZOFRAN) 4 MG tablet Take 1 tablet (4 mg total) by mouth every 6 (six) hours as needed for nausea. 20 tablet 0  . ONE TOUCH ULTRA TEST test strip USE 1 STRIP TO TEST THREE TIMES DAILY AS DIRECTED 100 each 0  . ONETOUCH DELICA LANCETS 51V MISC Use three times daily to check blood sugar. 100 each 11  . pantoprazole (PROTONIX) 40 MG tablet Take 40 mg by mouth daily.    . tamsulosin (FLOMAX) 0.4 MG CAPS capsule TAKE 1 CAPSULE(0.4 MG) BY MOUTH DAILY (Patient taking differently: Take 0.4 mg by mouth daily after breakfast.) 90 capsule 1  . timolol (TIMOPTIC)  0.5 % ophthalmic solution Place 2 drops into both eyes daily.   2  . vitamin B-12 (CYANOCOBALAMIN) 500 MCG tablet Take 500 mcg by mouth daily.    . vitamin E 180 MG (400 UNITS) capsule Take 400 Units by mouth daily.    [name] oxyCODONE (OXY IR/ROXICODONE) 5 MG immediate release tablet Take 1 tablet (5 mg total) by mouth every 4 (four) hours as needed for moderate pain or severe pain. (Patient not taking: Reported on 11/29/2020) 30 tablet 0   No current facility-administered medications for this visit.    Allergies  Allergen Reactions  . Metformin And Related Diarrhea    Review of Systems  Constitutional: Positive for unexpected weight change. Negative for activity change, diaphoresis and fatigue.  HENT: Negative.   Eyes: Positive for visual disturbance.  Respiratory: Positive for apnea, cough and wheezing.   Cardiovascular: Positive for leg swelling. Negative for chest pain and palpitations.  Gastrointestinal: Positive for diarrhea.  Endocrine: Negative.   Genitourinary: Positive for frequency.  Musculoskeletal: Positive for arthralgias, gait problem and joint swelling.  Skin: Negative.   Neurological: Positive for numbness. Negative for dizziness, syncope and weakness.  Hematological: Negative.   Psychiatric/Behavioral: Negative.     BP 123/76 (BP Location: Right Arm, Patient Position: Sitting)   Pulse 88   Resp 20   Ht _0  (1.803 m)   Wt 203 lb (92.1 kg)   SpO2 94% Comment: RA with mask on  BMI 28.31 kg/m  Physical Exam Constitutional:      Appearance: Normal appearance. He is normal weight.  HENT:     Head: Normocephalic and atraumatic.  Eyes:     Extraocular Movements: Extraocular movements intact.     Pupils: Pupils are equal, round, and reactive to light.  Neck:     Vascular: No carotid bruit.  Cardiovascular:     Rate and Rhythm: Normal rate and regular rhythm.     Pulses: Normal pulses.     Heart sounds: Normal heart sounds. No murmur heard.   Pulmonary:      Effort: Pulmonary effort is normal.     Comments: Decreased breath sounds over the left lower lobe with rales present Abdominal:     General: Abdomen is flat.     Palpations: Abdomen is soft.  Musculoskeletal:     Cervical back:  Normal range of motion and neck supple.     Comments: Mild bilateral lower leg and pedal edema.  Skin:    General: Skin is warm and dry.  Neurological:     General: No focal deficit present.     Mental Status: He is alert and oriented to person, place, and time.  Psychiatric:        Mood and Affect: Mood normal.        Behavior: Behavior normal.        Thought Content: Thought content normal.        Judgment: Judgment normal.      Diagnostic Tests:  Physicians  Panel Physicians Referring Physician Case Authorizing Physician  Minna Merritts, MD (Primary)      Procedures  RIGHT/LEFT HEART CATH AND CORONARY ANGIOGRAPHY   Conclusion   Ost LM to Mid LM lesion is 60% stenosed.  Ost LAD lesion is 70% stenosed.  Prox RCA to Dist RCA lesion is 100% stenosed.  Prox LAD lesion is 50% stenosed.  Dist LAD lesion is 35% stenosed.  1st Diag lesion is 80% stenosed.  2nd Mrg lesion is 70% stenosed.  There is moderate left ventricular systolic dysfunction.  LV end diastolic pressure is moderately elevated.  The left ventricular ejection fraction is 35-45% by visual estimate.  Hemodynamic findings consistent with mild pulmonary hypertension.     Indications  HFrEF (heart failure with reduced ejection fraction) ([place]) [I50.20 (ICD-10-CM)]   Procedural Details  Technical Details Cardiac Catheterization Procedure Note  Name: FORREST JAROSZEWSKI MRN: [id number] DOB: 01-31-1933  Procedure: Left Heart Cath, Selective Coronary Angiography, LV angiography, right heart catheterization  Indication:   Arrow Tomko is a 87 y.o. male with a history of  hyperlipidemia,  peripheral vascular disease with  angioplasty of the distal right SFA by Dr. Lucky Cowboy,   50% carotid disease bilaterally, history of poorly controlled diabetes,  history of stroke with minimal residual left sided deficits,  CT scan 10/05/2016 Moderate diffuse aortic atherosclerosis Moderate diffuse three-vessel coronary disease Prior smoker, stopped in 1969 Echo 11/14/20 with newly reduced LVEF 35%. Endorses LE edema and DOE, some chest tightness on exertion concerning for unstable angina Brought to the catheterization lab for further evaluation   Procedural details: The right groin was prepped, draped, and anesthetized with 1% lidocaine. Using modified Seldinger technique, a 5 French sheath was introduced into the right femoral artery. Standard Judkins catheters (JL 4, JR 4 and pigtail catheter, JL 5) were used for coronary angiography and left ventriculography.  7 French sheath introduced into right femoral vein, Swan-Ganz catheter used to obtain right heart pressures , catheter exchanges were performed over a guidewire. There were no immediate procedural complications. The patient was transferred to the post catheterization recovery area for further monitoring.  Moderate sedation: 1. Sedation used: 1.5 mg Versed, 37.5 mcg fentanyl IV 2. Time of administration:       945 AM     Time patient left for recovery 10:30 AM Total sedation time 45 minutes 3. I was Face to Face with the patient during this time: (code: [phone])   Procedural Findings:     Coronary angiography:  Coronary dominance: Right or codominant  Left mainstem:   Large vessel that bifurcates into the LAD and left circumflex, moderate distal left main disease estimated 50%  Left anterior descending (LAD):   Large vessel that extends to the apical region, diagonal branch 2 of moderate size, severe ostial LAD disease, heavily calcified Also with moderate to  severe proximal LAD disease Severe ostial D1 disease  Left circumflex (LCx): Large vessel with OM branch 2, mild proximal disease, moderate to severe OM 2  disease/bifurcating vessel  Right coronary artery (RCA): Dominant or codominant vessel, occluded proximally extending distal, PL and PDA fill via collaterals from left to right  Left ventriculography: Left ventricular systolic function is moderately depressed, not well visualized, hand-injection to minimize contrast, , LVEF is estimated at 35%, there is no significant mitral regurgitation though again not well visualized, no significant aortic valve stenosis  Right heart catheterization pressures RA 12 RV 47/12/15 PA 46/20/31 Wedge 17 Cardiac output 5.05, cardiac index 2.49  Final Conclusions:   Severe three-vessel disease Occluded RCA Severe ostial LAD disease, heavily calcified Moderate to severe proximal LAD and OM disease Reduced ejection fraction 35% based on prior echocardiogram Symptoms of heart failure as outpatient, stable angina  Right heart pressures mildly elevated, elevated EDP  Significant calcified plaque right femoral bifurcation  Recommendations:  Case discussed with interventional cardiology, Recommendation to have CT surgery evaluate the patient If not a surgical candidate, would be a very difficult stenting with atherectomy of the ostial LAD   Timothy Gollan 11/24/2020, 10:40 AM  Estimated blood loss <50 mL.   During this procedure medications were administered to achieve and maintain moderate conscious sedation while the patient's heart rate, blood pressure, and oxygen saturation were continuously monitored and I was present face-to-face 100% of this time.   Medications (Filter: Administrations occurring from 0831 to 1027 on 11/24/20)  midazolam (VERSED) injection (mg) Total dose:  1.5 mg  Date/Time Rate/Dose/Volume Action   11/24/20 0852 1 mg Given   0902 0.5 mg Given    fentaNYL (SUBLIMAZE) injection (mcg) Total dose:  50 mcg  Date/Time Rate/Dose/Volume Action   11/24/20 0852 25 mcg Given   0901 25 mcg Given    Heparin (Porcine) in NaCl  1000-0.9 UT/500ML-% SOLN (mL) Total volume:  500 mL  Date/Time Rate/Dose/Volume Action   11/24/20 1022 500 mL Given    lidocaine (PF) (XYLOCAINE) 1 % injection (mL) Total volume:  20 mL  Date/Time Rate/Dose/Volume Action   11/24/20 1023 20 mL Given    iohexol (OMNIPAQUE) 300 MG/ML solution (mL) Total volume:  140 mL  Date/Time Rate/Dose/Volume Action   11/24/20 1023 140 mL Given     Sedation Time  Sedation Time Physician-1: 1 hour 4 minutes 40 seconds   Contrast  Medication Name Total Dose  iohexol (OMNIPAQUE) 300 MG/ML solution 140 mL    Radiation/Fluoro  Fluoro time: 17.6 (min) DAP: 83.5 (Gycm2) Cumulative Air Kerma: 1136 (mGy)   Coronary Findings   Diagnostic Dominance: Right  Left Main  Ost LM to Mid LM lesion is 60% stenosed.  Left Anterior Descending  Ost LAD lesion is 70% stenosed.  Prox LAD lesion is 50% stenosed.  Dist LAD lesion is 35% stenosed.  First Diagonal Branch  1st Diag lesion is 80% stenosed.  Left Circumflex  Second Obtuse Marginal Branch  2nd Mrg lesion is 70% stenosed.  Right Coronary Artery  Vessel is small.  Prox RCA to Dist RCA lesion is 100% stenosed. The lesion is chronically occluded.  Right Posterior Descending Artery  Vessel is small in size.  Right Posterior Atrioventricular Artery  Vessel is small in size.  Collaterals  RPAV filled by collaterals from Dist Cx.    First Right Posterolateral Branch  Collaterals  1st RPL filled by collaterals from Dist LAD.    Third Right Posterolateral Branch  Collaterals  3rd RPL filled by collaterals from 3rd Mrg.     Intervention   No interventions have been documented.  Right Heart  Right Heart Pressures Hemodynamic findings consistent with mild pulmonary hypertension.   Wall Motion  Resting       All segments of the heart are hypokinetic.  Not well-visualized, poor injection Prior echocardiogram available           Left Heart  Left Ventricle The left  ventricle is mildly dilated. There is moderate left ventricular systolic dysfunction. LV end diastolic pressure is moderately elevated. The left ventricular ejection fraction is 35-45% by visual estimate. There are LV function abnormalities due to global hypokinesis.   Coronary Diagrams   Diagnostic Dominance: Right    Intervention    Implants    No implant documentation for this case.   Syngo Images  Show images for CARDIAC CATHETERIZATION  Images on Long Term Storage  Show images for [place], [place] to Procedure Log  Procedure Log     Hemo Data (last day) before discharge  AO Systolic Cath Pressure AO Diastolic Cath Pressure AO Mean Cath Pressure LV Systolic Cath Pressure LV End Diastolic LV Systolic LV End Diastolic LV dP/dt PA Systolic Cath Pressure PA Diastolic Cath Pressure PA Mean Cath Pressure RA Wedge A Wave RA Wedge V Wave RV Systolic Cath Pressure RV Diastolic Cath Pressure RV End Diastolic RV Systolic RV End Diastolic RV dP/dt PCW A Wave PCW V Wave PCW Mean AO O2 Sat PA O2 Sat AO O2 Sat Fick C.O. Fick C.I.  -- -- -- -- -- 110 mmHg 32 mmHg 672 mmHg/sec -- -- -- 14 mmHg 12 mmHg -- -- -- 47 mmHg 15 mmHg 336 mmHg/sec 20 mmHg 19 mmHg 17 mmHg -- -- -- 5.05 L/min 2.49 L/min/m2  -- -- -- -- -- -- -- -- 46 mmHg 20 mmHg 31 mmHg -- -- -- -- -- -- -- -- -- -- -- -- -- -- -- --  -- -- -- -- -- -- -- -- -- -- -- -- -- 44 mmHg 11 mmHg 15 mmHg -- -- -- -- -- -- -- -- -- -- --  -- -- -- -- -- -- -- -- -- -- -- -- -- 46 mmHg 11 mmHg 15 mmHg -- -- -- -- -- -- -- -- -- -- --  -- -- -- -- -- -- -- -- -- -- -- -- -- 47 mmHg 12 mmHg 15 mmHg -- -- -- -- -- -- -- -- -- -- --  92 38 mmHg 58 mmHg -- -- -- -- -- -- -- -- -- -- -- -- -- -- -- -- -- -- -- -- -- -- -- --  -- -- -- -- -- -- -- -- -- -- -- -- -- -- -- -- -- -- -- -- -- -- 98.3 % -- SA -- --  -- -- -- -- -- -- -- -- -- -- -- -- -- -- -- -- -- -- -- -- -- -- -- MV -- -- --  -- -- -- 109 mmHg 31 mmHg -- -- -- -- -- -- -- --  -- -- -- -- -- -- -- -- -- -- -- -- -- --  -- -- -- 110 mmHg 32 mmHg -- -- -- -- -- -- -- -- -- -- -- -- -- -- -- -- -- -- -- -- -- --  -- -- -- 110 mmHg 32 mmHg -- -- -- -- -- -- -- -- -- -- -- -- -- -- -- -- -- -- -- -- -- --  112 50 mmHg 76 mmHg -- -- -- -- -- --                       Impression:  This 87 year old gentleman has moderate left main and three-vessel coronary disease with 70% ostial LAD stenosis, 80% diagonal stenosis, 70% obtuse marginal stenosis, and an occluded right coronary artery with left-to-right collaterals filling a diffusely diseased distal vessel.  I agree that his coronary disease would be most amenable to coronary bypass surgery but I think his operative risk would be high given his advanced age, diffuse calcific atherosclerosis of the entire aorta noted on previous CT scan extending from the proximal ascending aorta into the iliacs as well as atherosclerosis of the carotid arteries and lower extremity vasculature.  I think his risk for perioperative stroke would be significant.  If he had any significant complication such as a stroke it may take him from being an independent 87 year old at home to being severely disabled.  He has no anginal symptoms but does have congestive heart failure symptoms which could be managed medically.  I think PCI of his stenoses would be high risk and he would be better off with medical treatment.  I reviewed the cardiac catheterization images with the patient and his daughter and companion.  I discussed the high risk involved in doing coronary bypass surgery in his particular case and they are in agreement with treating him medically.  He has not had a chest x-ray in the [facility] health system but he sounds like he may still has a significant left pleural effusion and left lower lobe collapse on examination.  This may contribute to his shortness of breath and should be evaluated further with chest x-ray and possibly CT scan of the  chest.   Plan:  He will continue to follow-up with his PCP and Dr. Rockey Situ who can coordinate continued treatment of his mild congestive heart failure symptoms.  I will be happy to see him back if the need arises.  I spent 60 minutes performing this consultation and > 50% of this time was spent face to face counseling and coordinating the care of this patient's multivessel coronary disease.   Gaye Pollack, MD [facility] [phone]

## 2020-11-30 ENCOUNTER — Telehealth: Payer: Self-pay | Admitting: Cardiovascular Disease

## 2020-11-30 ENCOUNTER — Other Ambulatory Visit: Payer: Self-pay | Admitting: Family

## 2020-11-30 DIAGNOSIS — T502X5A Adverse effect of carbonic-anhydrase inhibitors, benzothiadiazides and other diuretics, initial encounter: Secondary | ICD-10-CM

## 2020-11-30 DIAGNOSIS — I2510 Atherosclerotic heart disease of native coronary artery without angina pectoris: Secondary | ICD-10-CM

## 2020-11-30 DIAGNOSIS — E782 Mixed hyperlipidemia: Secondary | ICD-10-CM

## 2020-11-30 MED ORDER — POTASSIUM CHLORIDE ER 10 MEQ PO TBCR: 10.0000 meq | EXTENDED_RELEASE_TABLET | Freq: Every day | ORAL | 0 refills | Status: DC

## 2020-11-30 NOTE — Telephone Encounter (Signed)
Was able to reach pt regarding his lasix increase. Pt seen Dr. Cyndia Bent with cardiac sx in [place] yesterday. Note pt has bilateral LE swelling and pedal edema, [name] was advised to increase his lasix. Noted on pt's medication list pt is prescribe to take 10 mg (half tab from 20 mg tablet) once a day, however, pt reports Dr. Wynetta Emery has pt on 20 mg daily of lasix (his current pill is a 40 mg tab he halves daily for 20 mg). At this time [name], NP advised pt to take 40 mg lasix once a day for 5 days and also add in a potassium 10 mEq tab daily. Potassium script was sent to his [employer]. Pt verbalized understanding to take 40 mg lasix daily for 5 days then go back to his 20 mg Lasix daily and take the potassium daily as well. Reccommended to have BMP recheck at his f/u appt on 2/18 with Dr. Rockey Situ to reassess his potassium. At this time questions or concerns were address and no additional concerns at this time. Agreeable to plan, will call back for anything further.

## 2020-11-30 NOTE — Telephone Encounter (Signed)
Patient states he was seen by DR. Bartle yesterday, and he suggested his Lasix be increased. States DR. Bartle thought he had increased fluid, please call to discuss.

## 2020-12-04 ENCOUNTER — Other Ambulatory Visit: Payer: Self-pay

## 2020-12-04 DIAGNOSIS — I509 Heart failure, unspecified: Secondary | ICD-10-CM

## 2020-12-04 DIAGNOSIS — R0602 Shortness of breath: Secondary | ICD-10-CM

## 2020-12-04 NOTE — Progress Notes (Signed)
Dr. Rockey Situ sent a secure chat for pt to have a "PA and lateral CXR has been coordinated (dx :SOB, CHF)" Order placed, message sent to pt to go to [facility] for these films

## 2020-12-05 ENCOUNTER — Ambulatory Visit
Admission: RE | Admit: 2020-12-05 | Discharge: 2020-12-05 | Disposition: A | Discharge status: Home / Self Care | Payer: Medicare Other | Source: Ambulatory Visit | Attending: Cardiovascular Disease | Admitting: Cardiovascular Disease

## 2020-12-05 ENCOUNTER — Other Ambulatory Visit: Payer: Self-pay

## 2020-12-05 DIAGNOSIS — I509 Heart failure, unspecified: Secondary | ICD-10-CM | POA: Diagnosis present

## 2020-12-05 DIAGNOSIS — R0602 Shortness of breath: Secondary | ICD-10-CM | POA: Insufficient documentation

## 2020-12-05 NOTE — Progress Notes (Signed)
Cardiology Office Note  Date:  12/08/2020   ID:  Cortlin, Marano 03-14-1933, MRN [id number]  PCP:  Baxter Hire, MD   Chief Complaint  Patient presents with  . Follow-up    S/p Rt and Lt Heart Cath without Intervention    HPI:  Henry Lindsey is a 87 y.o. male with a history of  hyperlipidemia,  peripheral vascular disease with  angioplasty of the distal right SFA by Dr.  Lucky Cowboy,  50% carotid disease bilaterally, history of poorly controlled diabetes,  history of stroke with minimal residual left sided deficits,  CT scan 10/05/2016 Moderate diffuse aortic atherosclerosis Moderate diffuse three-vessel coronary disease Prior smoker, stopped in 1969 Presents for f/u of his CAD  F/u of his  Cardiac cath 11/24/2020 Severe three-vessel disease Occluded RCA Severe ostial LAD disease, heavily calcified Moderate to severe proximal LAD and OM disease Reduced ejection fraction 35% based on prior echocardiogram Symptoms of heart failure as outpatient, stable angina  Right heart pressures mildly elevated, elevated EDP Significant calcified plaque right femoral bifurcation Recommendation to have CT surgery evaluate the patient If not a surgical candidate, would be a very difficult stenting with atherectomy of the ostial LAD  Seen by CT surgery/Dr. Cyndia Bent Medical management recommended  Past several weeks has had ABD swelling, SOB, coughing Called our office 10 days ago, told to increase Lasix up to 40 daily Today with leg swelling, b/l, abdominal movement less swollen Still having trouble sleeping, wheezing, shortness of breath Denies excessive fluid intake Feels it all started when he was treated with prednisone for pneumonia  Lab work reviewed HGB 6.8, down from ten 1 year ago Total chol 96, LDL 32   right total knee replacement in several months time  For unclear reasons he is taking aspirin 325 mg x 2 daily with Pletal Reports having procedure 6 years ago No GERD  symptoms  Other past medical history reviewed  hospital admission for sepsis, cholecystitis, abdominal pain Gall bladder surgery scheduled for 11/13/2016 at [facility]  History of claudication type symptoms have improved particularly on the right side after angioplasty .  Previous ultrasound showed bilateral tibial occlusive disease, residual 50% left SFA stenosis .   He does have a history of a right thalamic lacunar infarct in February 2012.   PMH:   has a past medical history of Arthritis, BPH (benign prostatic hyperplasia), Carotid artery stenosis (11/2010), COPD (chronic obstructive pulmonary disease) ([place]), CVA (cerebral infarction) (11/2010), Diabetes mellitus, GERD (gastroesophageal reflux disease), Hyperlipidemia, Hypertension, Neuromuscular disorder ([place]), Peripheral vascular disease in diabetes mellitus ([place]) (06/2012), and Stroke ([place]).  PSH:    Past Surgical History:  Procedure Laterality Date  . CHOLECYSTECTOMY N/A 11/13/2016   Procedure: LAPAROSCOPIC CHOLECYSTECTOMY WITH INTRAOPERATIVE CHOLANGIOGRAM;  Surgeon: Olean Ree, MD;  Location: [facility] ORS;  Service: General;  Laterality: N/A;  . COLONOSCOPY WITH PROPOFOL N/A 02/10/2018   Procedure: COLONOSCOPY WITH PROPOFOL;  Surgeon: Lucilla Lame, MD;  Location: [facility] ENDOSCOPY;  Service: Endoscopy;  Laterality: N/A;  . ERCP N/A 11/17/2016   Procedure: ENDOSCOPIC RETROGRADE CHOLANGIOPANCREATOGRAPHY (ERCP);  Surgeon: Irene Shipper, MD;  Location: [facility] ENDOSCOPY;  Service: Endoscopy;  Laterality: N/A;  . ERCP N/A 02/04/2017   Procedure: ENDOSCOPIC RETROGRADE CHOLANGIOPANCREATOGRAPHY (ERCP) Stent removal;  Surgeon: Lucilla Lame, MD;  Location: [facility] ENDOSCOPY;  Service: Endoscopy;  Laterality: N/A;  . IR GENERIC HISTORICAL  10/06/2016   IR PERC CHOLECYSTOSTOMY 10/06/2016 Aletta Edouard, MD [facility]-INTERV RAD  . JOINT REPLACEMENT Left 2014   left knee  .  RIGHT/LEFT HEART CATH AND CORONARY ANGIOGRAPHY N/A 11/24/2020   Procedure: RIGHT/LEFT HEART CATH AND  CORONARY ANGIOGRAPHY;  Surgeon: Minna Merritts, MD;  Location: [place] CV LAB;  Service: Cardiovascular;  Laterality: N/A;  . TOTAL KNEE ARTHROPLASTY Right 08/08/2020   Procedure: TOTAL KNEE ARTHROPLASTY;  Surgeon: Lovell Sheehan, MD;  Location: [facility] ORS;  Service: Orthopedics;  Laterality: Right;  . UPPER GASTROINTESTINAL ENDOSCOPY    . WRIST SURGERY      Current Outpatient Medications  Medication Sig Dispense Refill  . albuterol (VENTOLIN HFA) 108 (90 Base) MCG/ACT inhaler Inhale 2 puffs into the lungs every 6 (six) hours as needed.    [name] ascorbic acid (VITAMIN C) 500 MG tablet Take 500 mg by mouth daily.     [name] aspirin EC 81 MG tablet Take 1 tablet (81 mg total) by mouth daily. Swallow whole. 90 tablet 3  . Blood Glucose Monitoring Suppl (ONE TOUCH ULTRA SYSTEM KIT) w/Device KIT 1 kit by Does not apply route once. Use DX code E11.59 1 each 0  . cholecalciferol (VITAMIN D) 25 MCG (1000 UNIT) tablet Take 1,000 Units by mouth daily.    . cholestyramine (QUESTRAN) 4 g packet Take 4 g by mouth 3 (three) times daily with meals.    . cilostazol (PLETAL) 100 MG tablet Take 100 mg by mouth 2 (two) times daily.    . diclofenac Sodium (VOLTAREN) 1 % GEL Apply 2 g topically 2 (two) times daily as needed (pain).    . furosemide (LASIX) 40 MG tablet Take 1 tablet (40 mg total) by mouth daily. Take an extra 40 mg in the afternoon if needed for leg swelling 90 tablet 3  . gabapentin (NEURONTIN) 400 MG capsule Take 400 mg by mouth 2 (two) times daily.    [name] glipiZIDE (GLUCOTROL) 10 MG tablet Take 10 mg by mouth 2 (two) times daily before a meal.    . HYDROcodone-acetaminophen (NORCO/VICODIN) 5-325 MG tablet Take 1 tablet by mouth every 6 (six) hours as needed.    . INS SYRINGE/NEEDLE 1CC/28G (B-D INSULIN SYRINGE 1CC/28G) 28G X 1/2" 1 ML MISC USE TO ADMINISTER INSULIN DAILY 100 each 0  . insulin NPH-regular Human (70-30) 100 UNIT/ML injection Inject 23 Units into the skin in the morning and at bedtime.      [name] latanoprost (XALATAN) 0.005 % ophthalmic solution Place 2 drops into both eyes at bedtime.   5  . Multiple Vitamin (MULTIVITAMIN WITH MINERALS) TABS tablet Take 1 tablet by mouth daily.    . ONE TOUCH ULTRA TEST test strip USE 1 STRIP TO TEST THREE TIMES DAILY AS DIRECTED 100 each 0  . ONETOUCH DELICA LANCETS 30Q MISC Use three times daily to check blood sugar. 100 each 11  . pantoprazole (PROTONIX) 40 MG tablet Take 40 mg by mouth daily.    . tamsulosin (FLOMAX) 0.4 MG CAPS capsule Take 0.4 mg by mouth daily after breakfast.    . timolol (TIMOPTIC) 0.5 % ophthalmic solution Place 2 drops into both eyes daily.   2  . vitamin B-12 (CYANOCOBALAMIN) 500 MCG tablet Take 500 mcg by mouth daily.    . vitamin E 180 MG (400 UNITS) capsule Take 400 Units by mouth daily.    [name] atorvastatin (LIPITOR) 40 MG tablet Take 1 tablet (40 mg total) by mouth daily. 90 tablet 3  . metoprolol succinate (TOPROL-XL) 100 MG 24 hr tablet Take 1 tablet (100 mg total) by mouth daily. Take with or immediately following a meal.  90 tablet 3  . nitroGLYCERIN (NITROSTAT) 0.4 MG SL tablet Place 1 tablet (0.4 mg total) under the tongue every 5 (five) minutes as needed for chest pain. MAXIMUM 3 TABLETS 50 tablet 3  . potassium chloride (KLOR-CON) 10 MEQ tablet Take 1 tablet (10 mEq total) by mouth daily. 90 tablet 3   No current facility-administered medications for this visit.     Allergies:   Metformin and related   Social History:  The patient  reports that he quit smoking about 52 years ago. His smoking use included cigarettes. He started smoking about 85 years ago. He has a 16.50 pack-year smoking history. He has never used smokeless tobacco. He reports that he does not drink alcohol and does not use drugs.   Family History:   family history includes Diabetes in his mother.    Review of Systems: Review of Systems  Constitutional: Negative.   HENT: Negative.   Respiratory: Positive for shortness of breath.    Cardiovascular: Positive for leg swelling.  Gastrointestinal: Negative.   Musculoskeletal: Positive for joint pain.  Neurological: Negative.   Psychiatric/Behavioral: Negative.   All other systems reviewed and are negative.   PHYSICAL EXAM: BP 130/76   Pulse 78   Ht '5\' 11"'  (1.803 m)   Wt 201 lb (91.2 kg)   BMI 28.03 kg/m  Constitutional:  oriented to person, place, and time. No distress.  HENT:  Head: Grossly normal Eyes:  no discharge. No scleral icterus.  Neck: No JVD, no carotid bruits  Cardiovascular: Regular rate and rhythm, no murmurs appreciated Trace to 1+ pitting lower extremity edema Pulmonary/Chest: Clear to auscultation bilaterally, no wheezes or rails Abdominal: Soft.  Mild abdominal distention Musculoskeletal: Normal range of motion Neurological:  normal muscle tone. Coordination normal. No atrophy Skin: Skin warm and dry Psychiatric: normal affect, pleasant   Recent Labs: 11/17/2020: BUN 17; Creatinine, Ser 1.21; Hemoglobin 12.0; Platelets 242; Potassium 4.6; Sodium 142    Lipid Panel Lab Results  Component Value Date   CHOL 107 12/24/2018   HDL 35.90 (L) 12/24/2018   LDLCALC 38 12/24/2018   TRIG 164.0 (H) 12/24/2018      Wt Readings from Last 3 Encounters:  12/08/20 201 lb (91.2 kg)  11/29/20 203 lb (92.1 kg)  11/24/20 183 lb (83 kg)     ASSESSMENT AND PLAN:  Type 2 diabetes, uncontrolled, with peripheral circulatory disorder (HCC) Dramatic improvement in hemoglobin A1c 10 down to 6.8 Discussed continued strict diet with him  PAD (peripheral artery disease) (HCC) Diffuse aortic atherosclerosis noted, moderate degree Severe carotid disease, followed by vascular Recommend he take aspirin 81 mg daily with his Pletal Cholesterol at goal  Carotid artery stenosis with cerebral infarction (HCC) On aspirin with Pletal, diabetes numbers better  Coronary artery disease involving native coronary artery of native heart without angina  pectoris Cardiac catheterization results discussed with him, not a candidate for CABG at this time Denies anginal symptoms, continue to treat aggressively, diabetes and lipids at goal, non-smoker  Acute on chronic systolic CHF Recommend he take Lasix 40 daily with potassium 10 extra Lasix after lunch as needed for any swelling Recommend he moderate his fluid intake   Total encounter time more than 35 minutes  Greater than 50% was spent in counseling and coordination of care with the patient   No orders of the defined types were placed in this encounter.    Signed, Esmond Plants, M.D., Ph.D. 12/08/2020  [place], [place]

## 2020-12-08 ENCOUNTER — Other Ambulatory Visit: Payer: Self-pay

## 2020-12-08 ENCOUNTER — Ambulatory Visit (INDEPENDENT_AMBULATORY_CARE_PROVIDER_SITE_OTHER): Payer: Medicare Other | Admitting: Cardiovascular Disease

## 2020-12-08 ENCOUNTER — Encounter: Payer: Self-pay | Admitting: Cardiovascular Disease

## 2020-12-08 VITALS — BP 130/76 | HR 78 | Ht 71.0 in | Wt 201.0 lb

## 2020-12-08 DIAGNOSIS — I10 Essential (primary) hypertension: Secondary | ICD-10-CM

## 2020-12-08 DIAGNOSIS — I63239 Cerebral infarction due to unspecified occlusion or stenosis of unspecified carotid arteries: Secondary | ICD-10-CM

## 2020-12-08 DIAGNOSIS — E1151 Type 2 diabetes mellitus with diabetic peripheral angiopathy without gangrene: Secondary | ICD-10-CM

## 2020-12-08 DIAGNOSIS — I25118 Atherosclerotic heart disease of native coronary artery with other forms of angina pectoris: Secondary | ICD-10-CM

## 2020-12-08 DIAGNOSIS — I5043 Acute on chronic combined systolic (congestive) and diastolic (congestive) heart failure: Secondary | ICD-10-CM

## 2020-12-08 DIAGNOSIS — E782 Mixed hyperlipidemia: Secondary | ICD-10-CM

## 2020-12-08 MED ORDER — ATORVASTATIN CALCIUM 40 MG PO TABS: 40.0000 mg | ORAL_TABLET | Freq: Every day | ORAL | 3 refills | Status: DC

## 2020-12-08 MED ORDER — NITROGLYCERIN 0.4 MG SL SUBL: 0.4000 mg | SUBLINGUAL_TABLET | SUBLINGUAL | 3 refills | Status: DC | PRN

## 2020-12-08 MED ORDER — FUROSEMIDE 40 MG PO TABS: 40.0000 mg | ORAL_TABLET | Freq: Every day | ORAL | 3 refills | Status: DC

## 2020-12-08 MED ORDER — POTASSIUM CHLORIDE ER 10 MEQ PO TBCR: 10.0000 meq | EXTENDED_RELEASE_TABLET | Freq: Every day | ORAL | 3 refills | Status: DC

## 2020-12-08 MED ORDER — METOPROLOL SUCCINATE ER 100 MG PO TB24: 100.0000 mg | ORAL_TABLET | Freq: Every day | ORAL | 3 refills | Status: DC

## 2020-12-08 NOTE — Patient Instructions (Addendum)
Medication Instructions:    Aspirin 81 mg daily   Lasix 40 mg daily   Take potassium 10 mEq daily  You may take an extra lasix 40mg   in the afternoon with another dose of  potassium 10 meq for leg swelling  Lab work: No new labs needed  Testing/Procedures: No new testing needed   Follow-Up:  . You will need a follow up appointment in 2 months  . Providers on your designated Care Team:   . Murray Hodgkins, NP . Christell Faith, PA-C . Marrianne Mood, PA-C

## 2020-12-12 ENCOUNTER — Telehealth: Payer: Self-pay | Admitting: Cardiovascular Disease

## 2020-12-12 NOTE — Telephone Encounter (Signed)
Was able to return pt's call, Henry Lindsey reports having trouble sleeping at night d/t cough and wheezing, reports having to get up and go sleep in the recliner. Reports fluid pills have relieved some of the fluid build up but now concern for possible PNA in lungs and wants an ABX. Advised pt to reach out to his PCP for an evaluation, may need another CXR and they may want to listen to his lungs. ABX would be unnecessary if he doesn't have an infection, would be better for an in person evaluation. Pt verbalized understanding, will reach out to his PCP.

## 2020-12-12 NOTE — Telephone Encounter (Signed)
Patient calling in to report he is still having issues sleeping due to wheezing and coughing. Patient states he is still taking all medication as directed and the fluid has gone away from his ankles. Patient is wanting to know if he could have an antibiotic or something that may help this issue  Please advise

## 2021-01-02 ENCOUNTER — Telehealth: Payer: Self-pay | Admitting: Cardiovascular Disease

## 2021-01-02 NOTE — Telephone Encounter (Signed)
Patient states he awakes "about 8 times during the night" and his arms are asleep.  States his hands and arms "feel like they are going to bust" .Please call to discuss. States since he has started taking Lasix, his ankle swelling has gone down.

## 2021-01-03 NOTE — Telephone Encounter (Signed)
Was able to return Henry Lindsey phone call, he stated that he can only sleep on his back or sitting up, cannot lay on his right or left side, wakes up frequently with his hands and arms tingling and numb. Advised that he is probably compressing a nerve when he is sleeping that is causing this discomfort. Usually not a side affect of his cardiac medication as this is only happening when he is sleeping and it resolves after waking up and reposition himself. Suggested speaking with PCP for further evaluation, possible consult to ortho or chiropractor for adjustment if PCP feels necessary. Henry Lindsey verbalized understanding, has an appt soon with PCP. He is grateful for the return phone call, nothing further at this time.

## 2021-01-30 NOTE — Progress Notes (Signed)
Cardiology Office Note  Date:  01/31/2021   ID:  Henry, Lindsey 07/23/1933, MRN [id number]  PCP:  [place] Hire, MD   Chief Complaint  Patient presents with  . 2 month follow up     Patient c/o LE edema, weeping legs and has shortness of breath with over exertion. Medications reviewed by the patient verbally.     HPI:  Henry Lindsey is a 87 y.o. male with a history of  hyperlipidemia,  peripheral vascular disease with  angioplasty of the distal right SFA by Dr.  Lucky Cowboy,  50% carotid disease bilaterally, history of poorly controlled diabetes,  history of stroke with minimal residual left sided deficits,  CT scan 10/05/2016 Moderate diffuse aortic atherosclerosis Moderate diffuse three-vessel coronary disease Prior smoker, stopped in 1969 Presents for f/u of his CAD  Last seen in clinic December 08, 2020   Cardiac cath 11/24/2020 Severe three-vessel disease Occluded RCA Severe ostial LAD disease, heavily calcified Moderate to severe proximal LAD and OM disease Reduced ejection fraction 35% based on prior echocardiogram  Right heart pressures mildly elevated, elevated EDP Significant calcified plaque right femoral bifurcation Recommendation to have CT surgery evaluate the patient If not a surgical candidate, would be a very difficult stenting with atherectomy of the ostial LAD  Seen by CT surgery/Dr. Cyndia Bent Medical management recommended  Lasix stopped, changed to torsemide  Taking torsemide 20 mg daily, last week QOD Swelling coming down Large regions of open wound, r>l leg Still with foot swelling  Mild SOB, on exertion "working on exercise", walks around [employer] Arms "numb" at night, put himself back on asa 325 ("will help flow")  EKG personally reviewed by myself on todays visit NSR rate 80 bpm, T wave ABn  Lab work reviewed A1C 6.4  Other past medical history reviewed  hospital admission for sepsis, cholecystitis, abdominal pain Gall bladder surgery  scheduled for 11/13/2016 at [facility]  History of claudication type symptoms have improved particularly on the right side after angioplasty .  Previous ultrasound showed bilateral tibial occlusive disease, residual 50% left SFA stenosis .   He does have a history of a right thalamic lacunar infarct in February 2012.   PMH:   has a past medical history of Arthritis, BPH (benign prostatic hyperplasia), Carotid artery stenosis (11/2010), COPD (chronic obstructive pulmonary disease) ([place]), CVA (cerebral infarction) (11/2010), Diabetes mellitus, GERD (gastroesophageal reflux disease), Hyperlipidemia, Hypertension, Neuromuscular disorder ([place]), Peripheral vascular disease in diabetes mellitus ([place]) (06/2012), and Stroke ([place]).  PSH:    Past Surgical History:  Procedure Laterality Date  . CHOLECYSTECTOMY N/A 11/13/2016   Procedure: LAPAROSCOPIC CHOLECYSTECTOMY WITH INTRAOPERATIVE CHOLANGIOGRAM;  Surgeon: Olean Ree, MD;  Location: [facility] ORS;  Service: General;  Laterality: N/A;  . COLONOSCOPY WITH PROPOFOL N/A 02/10/2018   Procedure: COLONOSCOPY WITH PROPOFOL;  Surgeon: Lucilla Lame, MD;  Location: [facility] ENDOSCOPY;  Service: Endoscopy;  Laterality: N/A;  . ERCP N/A 11/17/2016   Procedure: ENDOSCOPIC RETROGRADE CHOLANGIOPANCREATOGRAPHY (ERCP);  Surgeon: Irene Shipper, MD;  Location: [facility] ENDOSCOPY;  Service: Endoscopy;  Laterality: N/A;  . ERCP N/A 02/04/2017   Procedure: ENDOSCOPIC RETROGRADE CHOLANGIOPANCREATOGRAPHY (ERCP) Stent removal;  Surgeon: Lucilla Lame, MD;  Location: [facility] ENDOSCOPY;  Service: Endoscopy;  Laterality: N/A;  . IR GENERIC HISTORICAL  10/06/2016   IR PERC CHOLECYSTOSTOMY 10/06/2016 Aletta Edouard, MD [facility]  . JOINT REPLACEMENT Left 2014   left knee  . RIGHT/LEFT HEART CATH AND CORONARY ANGIOGRAPHY N/A 11/24/2020   Procedure: RIGHT/LEFT HEART CATH AND CORONARY ANGIOGRAPHY;  Surgeon: Minna Merritts, MD;  Location: [place] CV LAB;  Service: Cardiovascular;  Laterality: N/A;   . TOTAL KNEE ARTHROPLASTY Right 08/08/2020   Procedure: TOTAL KNEE ARTHROPLASTY;  Surgeon: Lovell Sheehan, MD;  Location: [facility] ORS;  Service: Orthopedics;  Laterality: Right;  . UPPER GASTROINTESTINAL ENDOSCOPY    . WRIST SURGERY      Current Outpatient Medications  Medication Sig Dispense Refill  . albuterol (VENTOLIN HFA) 108 (90 Base) MCG/ACT inhaler Inhale 2 puffs into the lungs every 6 (six) hours as needed.    [name] ascorbic acid (VITAMIN C) 500 MG tablet Take 500 mg by mouth daily.     [name] aspirin 325 MG tablet Take 325 mg by mouth in the morning and at bedtime.    [name] atorvastatin (LIPITOR) 40 MG tablet Take 1 tablet (40 mg total) by mouth daily. 90 tablet 3  . Blood Glucose Monitoring Suppl (ONE TOUCH ULTRA SYSTEM KIT) w/Device KIT 1 kit by Does not apply route once. Use DX code E11.59 1 each 0  . cholecalciferol (VITAMIN D) 25 MCG (1000 UNIT) tablet Take 1,000 Units by mouth daily.    . cholestyramine (QUESTRAN) 4 g packet Take 4 g by mouth 3 (three) times daily with meals.    . cilostazol (PLETAL) 100 MG tablet Take 100 mg by mouth 2 (two) times daily.    . diclofenac Sodium (VOLTAREN) 1 % GEL Apply 2 g topically 2 (two) times daily as needed (pain).    [name] gabapentin (NEURONTIN) 400 MG capsule Take 400 mg by mouth 2 (two) times daily.    [name] glipiZIDE (GLUCOTROL) 10 MG tablet Take 10 mg by mouth 2 (two) times daily before a meal.    . HYDROcodone-acetaminophen (NORCO/VICODIN) 5-325 MG tablet Take 1 tablet by mouth every 6 (six) hours as needed.    . INS SYRINGE/NEEDLE 1CC/28G (B-D INSULIN SYRINGE 1CC/28G) 28G X 1/2" 1 ML MISC USE TO ADMINISTER INSULIN DAILY 100 each 0  . insulin NPH-regular Human (70-30) 100 UNIT/ML injection Inject 23 Units into the skin in the morning and at bedtime.     [name] latanoprost (XALATAN) 0.005 % ophthalmic solution Place 2 drops into both eyes at bedtime.   5  . metoprolol succinate (TOPROL-XL) 100 MG 24 hr tablet Take 1 tablet (100 mg total) by mouth daily. Take  with or immediately following a meal. 90 tablet 3  . Multiple Vitamin (MULTIVITAMIN WITH MINERALS) TABS tablet Take 1 tablet by mouth daily.    . nitroGLYCERIN (NITROSTAT) 0.4 MG SL tablet Place 1 tablet (0.4 mg total) under the tongue every 5 (five) minutes as needed for chest pain. MAXIMUM 3 TABLETS 50 tablet 3  . ONE TOUCH ULTRA TEST test strip USE 1 STRIP TO TEST THREE TIMES DAILY AS DIRECTED 100 each 0  . ONETOUCH DELICA LANCETS 77L MISC Use three times daily to check blood sugar. 100 each 11  . pantoprazole (PROTONIX) 40 MG tablet Take 40 mg by mouth daily.    . potassium chloride (KLOR-CON) 10 MEQ tablet Take 1 tablet (10 mEq total) by mouth daily. 90 tablet 3  . SSD, SILVER SULFADIAZINE, EX Apply 50 g topically as needed.    . tamsulosin (FLOMAX) 0.4 MG CAPS capsule Take 0.4 mg by mouth daily after breakfast.    . timolol (TIMOPTIC) 0.5 % ophthalmic solution Place 2 drops into both eyes daily.   2  . torsemide (DEMADEX) 20 MG tablet Take 20 mg by mouth every other day.    [name]  vitamin B-12 (CYANOCOBALAMIN) 500 MCG tablet Take 500 mcg by mouth daily.    . vitamin E 180 MG (400 UNITS) capsule Take 400 Units by mouth daily.     No current facility-administered medications for this visit.     Allergies:   Metformin and related   Social History:  The patient  reports that he quit smoking about 52 years ago. His smoking use included cigarettes. He started smoking about 85 years ago. He has a 16.50 pack-year smoking history. He has never used smokeless tobacco. He reports that he does not drink alcohol and does not use drugs.   Family History:   family history includes Diabetes in his mother.    Review of Systems: Review of Systems  Constitutional: Negative.   HENT: Negative.   Respiratory: Positive for shortness of breath.   Cardiovascular: Positive for leg swelling.  Gastrointestinal: Negative.   Musculoskeletal: Positive for joint pain.  Neurological: Negative.    Psychiatric/Behavioral: Negative.   All other systems reviewed and are negative.   PHYSICAL EXAM: BP (!) 110/52 (BP Location: Left Arm, Patient Position: Sitting, Cuff Size: Normal)   Pulse 80   Ht '5\' 11"'  (1.803 m)   Wt 200 lb 2 oz (90.8 kg)   SpO2 96%   BMI 27.91 kg/m  Constitutional:  oriented to person, place, and time. No distress.  HENT:  Head: Grossly normal Eyes:  no discharge. No scleral icterus.  Neck: No JVD, no carotid bruits  Cardiovascular: Regular rate and rhythm, no murmurs appreciated Trace pitting lower extremity edema Pulmonary/Chest: Clear to auscultation bilaterally, no wheezes or rails Abdominal: Soft.  Mild abdominal distention Musculoskeletal: Normal range of motion Neurological:  normal muscle tone. Coordination normal. No atrophy Skin: Skin warm and dry Psychiatric: normal affect, pleasant   Recent Labs: 11/17/2020: BUN 17; Creatinine, Ser 1.21; Hemoglobin 12.0; Platelets 242; Potassium 4.6; Sodium 142    Lipid Panel Lab Results  Component Value Date   CHOL 107 12/24/2018   HDL 35.90 (L) 12/24/2018   LDLCALC 38 12/24/2018   TRIG 164.0 (H) 12/24/2018      Wt Readings from Last 3 Encounters:  01/31/21 200 lb 2 oz (90.8 kg)  12/08/20 201 lb (91.2 kg)  11/29/20 203 lb (92.1 kg)     ASSESSMENT AND PLAN:  Type 2 diabetes, uncontrolled, with peripheral circulatory disorder (HCC) Dramatic improvement in hemoglobin A1c 10 down to 6.4 He is active, does not eat out  PAD (peripheral artery disease) (HCC) Diffuse aortic atherosclerosis noted, moderate degree Severe carotid disease, followed by vascular Recommend he take aspirin 81 mg daily with his Pletal He went back to 325 mg daily Suggested asa 81 daily  Carotid artery stenosis with cerebral infarction (HCC) On aspirin with Pletal, diabetes numbers better  Coronary artery disease involving native coronary artery of native heart without angina pectoris Cardiac catheterization results  discussed with him, not a candidate for CABG at this time Currently with no symptoms of angina. No further workup at this time. Continue current medication regimen.  A cute on chronic systolic CHF Torsemide QOD, extra on other days for leg swelling   Total encounter time more than 35 minutes  Greater than 50% was spent in counseling and coordination of care with the patient   Orders Placed This Encounter  Procedures  . EKG 12-Lead     Signed, Esmond Plants, M.D., Ph.D. 01/31/2021  [place], [place]

## 2021-01-31 ENCOUNTER — Ambulatory Visit (INDEPENDENT_AMBULATORY_CARE_PROVIDER_SITE_OTHER): Payer: Medicare Other | Admitting: Cardiovascular Disease

## 2021-01-31 ENCOUNTER — Other Ambulatory Visit: Payer: Self-pay

## 2021-01-31 ENCOUNTER — Encounter: Payer: Self-pay | Admitting: Cardiovascular Disease

## 2021-01-31 VITALS — BP 110/52 | HR 80 | Ht 71.0 in | Wt 200.1 lb

## 2021-01-31 DIAGNOSIS — I25118 Atherosclerotic heart disease of native coronary artery with other forms of angina pectoris: Secondary | ICD-10-CM

## 2021-01-31 DIAGNOSIS — I5043 Acute on chronic combined systolic (congestive) and diastolic (congestive) heart failure: Secondary | ICD-10-CM

## 2021-01-31 DIAGNOSIS — E1151 Type 2 diabetes mellitus with diabetic peripheral angiopathy without gangrene: Secondary | ICD-10-CM | POA: Diagnosis not present

## 2021-01-31 DIAGNOSIS — E782 Mixed hyperlipidemia: Secondary | ICD-10-CM

## 2021-01-31 DIAGNOSIS — I10 Essential (primary) hypertension: Secondary | ICD-10-CM

## 2021-01-31 DIAGNOSIS — I63239 Cerebral infarction due to unspecified occlusion or stenosis of unspecified carotid arteries: Secondary | ICD-10-CM | POA: Diagnosis not present

## 2021-01-31 MED ORDER — ASPIRIN EC 81 MG PO TBEC: 81.0000 mg | DELAYED_RELEASE_TABLET | Freq: Every day | ORAL | 3 refills | Status: DC

## 2021-01-31 NOTE — Patient Instructions (Addendum)
Medication Instructions:  Continue torsemide 20 MG QOD (EVERY OTHER DAY)  Take an extra torsemide on in between days for increase leg swelling  Lab work: No new labs needed  Testing/Procedures: No new testing needed  Follow-Up:  . You will need a follow up appointment in 3 months, . APP ok  . Providers on your designated Care Team:   . Murray Hodgkins, NP . Christell Faith, PA-C . Marrianne Mood, PA-C   COVID-19 Vaccine Information can be found at: [email or web address] For questions related to vaccine distribution or appointments, please email [email or web address] or call [phone].

## 2021-03-15 ENCOUNTER — Telehealth: Payer: Self-pay | Admitting: Cardiovascular Disease

## 2021-03-15 NOTE — Telephone Encounter (Signed)
Patient states since last visit he has seen his PCP and was prescribed torsemide. Patient reports his legs have gotten worse. Patient reports weeping on legs, red spots and tingling in extremities.  Patient is wanting to know if he would be a canidate for taking spiroloactone and if he could get an Rx. Patient was advised about the medication through family.

## 2021-03-15 NOTE — Telephone Encounter (Signed)
Patient doesn't want to try spironolactone. He meant to ask about jardiance.

## 2021-03-19 NOTE — Telephone Encounter (Signed)
Looking for an echo in the system to help guide [place] with his medications including Jardiance Cannot find one Would consider echocardiogram to determine which medications are needed

## 2021-03-20 NOTE — Telephone Encounter (Signed)
Was able to reach out to Henry Lindsey regarding medication changes and advise don Dr. Donivan Scull response of  "Looking for an echo in the system to help guide [place] with his medications including Jardiance Cannot find one Would consider echocardiogram to determine which medications are needed"  Henry Lindsey stated "disregard medication chances and starting new med of Jardiance, I just left my PCP, they said my EKG looked good and that my leges are better" Stated he will keep his f/u appt IN July with Cadence, but also did not need an echo, had one earlier this year.  Upon chart review, echo done at [facility] in Jan 2022, [name], NP discuss results with pt at his visit with her in Jan as well. Results in [place].  Henry Lindsey stated he is thankful for the phone call, but stated he is doing "much better" and is happy with current meds he is on, no need for changes. Advised to call back with any further concerns.

## 2021-03-23 ENCOUNTER — Other Ambulatory Visit (INDEPENDENT_AMBULATORY_CARE_PROVIDER_SITE_OTHER): Payer: Self-pay | Admitting: Nurse Practitioner

## 2021-03-23 DIAGNOSIS — I739 Peripheral vascular disease, unspecified: Secondary | ICD-10-CM

## 2021-03-23 DIAGNOSIS — I6523 Occlusion and stenosis of bilateral carotid arteries: Secondary | ICD-10-CM

## 2021-03-27 ENCOUNTER — Ambulatory Visit (INDEPENDENT_AMBULATORY_CARE_PROVIDER_SITE_OTHER): Payer: Medicare Other | Admitting: Vascular Surgery

## 2021-03-27 ENCOUNTER — Other Ambulatory Visit: Payer: Self-pay

## 2021-03-27 ENCOUNTER — Ambulatory Visit (INDEPENDENT_AMBULATORY_CARE_PROVIDER_SITE_OTHER): Payer: Medicare Other

## 2021-03-27 ENCOUNTER — Encounter (INDEPENDENT_AMBULATORY_CARE_PROVIDER_SITE_OTHER): Payer: Self-pay | Admitting: Vascular Surgery

## 2021-03-27 VITALS — BP 113/69 | HR 81 | Ht 71.0 in | Wt 198.0 lb

## 2021-03-27 DIAGNOSIS — I10 Essential (primary) hypertension: Secondary | ICD-10-CM | POA: Diagnosis not present

## 2021-03-27 DIAGNOSIS — I63239 Cerebral infarction due to unspecified occlusion or stenosis of unspecified carotid arteries: Secondary | ICD-10-CM

## 2021-03-27 DIAGNOSIS — M7989 Other specified soft tissue disorders: Secondary | ICD-10-CM

## 2021-03-27 DIAGNOSIS — E1151 Type 2 diabetes mellitus with diabetic peripheral angiopathy without gangrene: Secondary | ICD-10-CM

## 2021-03-27 DIAGNOSIS — E782 Mixed hyperlipidemia: Secondary | ICD-10-CM

## 2021-03-27 DIAGNOSIS — E1142 Type 2 diabetes mellitus with diabetic polyneuropathy: Secondary | ICD-10-CM | POA: Diagnosis not present

## 2021-03-27 DIAGNOSIS — I6523 Occlusion and stenosis of bilateral carotid arteries: Secondary | ICD-10-CM

## 2021-03-27 DIAGNOSIS — I739 Peripheral vascular disease, unspecified: Secondary | ICD-10-CM

## 2021-03-27 NOTE — Assessment & Plan Note (Signed)
Marked.  With weeping and ulceration in both legs.  We are going to place him in a 3 layer Unna boot today bilaterally, and these will be changed weekly for 3 to 4 weeks.  We will then reassess his wounds.

## 2021-03-27 NOTE — Assessment & Plan Note (Signed)
blood glucose control important in reducing the progression of atherosclerotic disease. Also, involved in wound healing. On appropriate medications.  

## 2021-03-27 NOTE — Assessment & Plan Note (Signed)
lipid control important in reducing the progression of atherosclerotic disease. Continue statin therapy  

## 2021-03-27 NOTE — Progress Notes (Signed)
MRN : [id number]  Henry Lindsey is a 87 y.o. (01-28-1933) male who presents with chief complaint of  Chief Complaint  Patient presents with  . Follow-up    [id number]  . Carotid  .  History of Present Illness: Patient returns in follow-up of multiple vascular issues, but his biggest complaint today is of bilateral leg swelling and weeping as well as redness.  He had pneumonia and developed severe leg swelling after hospitalization.  He was started on diuretics and antibiotics, but that has not improved much.  He has pain in the legs although it is mild to moderate.  The swelling is impressive.  The weeping is also significant.  He had ABIs done today which were 1.02 on the right and 0.96 on the left with digital pressures of 52 on the right and 61 on the left which were mildly reduced from studies last year. He is also followed for carotid disease.  No recent focal neurologic symptoms. Duplex today shows stable velocities in the 40 to 59% range on the right and in the 40 to 59% range on the left without significant progression from previous studies.   Current Outpatient Medications  Medication Sig Dispense Refill  . albuterol (VENTOLIN HFA) 108 (90 Base) MCG/ACT inhaler Inhale 2 puffs into the lungs every 6 (six) hours as needed.    .Marland Kitchenascorbic acid (VITAMIN C) 500 MG tablet Take 500 mg by mouth daily.     .Marland Kitchenaspirin EC 81 MG tablet Take 1 tablet (81 mg total) by mouth daily. Swallow whole. 90 tablet 3  . atorvastatin (LIPITOR) 40 MG tablet Take 1 tablet (40 mg total) by mouth daily. 90 tablet 3  . Blood Glucose Monitoring Suppl (ONE TOUCH ULTRA SYSTEM KIT) w/Device KIT 1 kit by Does not apply route once. Use DX code E11.59 1 each 0  . cholecalciferol (VITAMIN D) 25 MCG (1000 UNIT) tablet Take 1,000 Units by mouth daily.    . cholestyramine (QUESTRAN) 4 g packet Take 4 g by mouth 3 (three) times daily with meals.    . cilostazol (PLETAL) 100 MG tablet Take 100 mg by mouth 2 (two) times  daily.    . diclofenac Sodium (VOLTAREN) 1 % GEL Apply 2 g topically 2 (two) times daily as needed (pain).    .Marland Kitchendoxycycline (VIBRA-TABS) 100 MG tablet Take by mouth.    . furosemide (LASIX) 40 MG tablet Take by mouth.    . gabapentin (NEURONTIN) 400 MG capsule Take 400 mg by mouth 2 (two) times daily.    .Marland KitchenglipiZIDE (GLUCOTROL) 10 MG tablet Take 10 mg by mouth 2 (two) times daily before a meal.    . HYDROcodone bit-homatropine (HYCODAN) 5-1.5 MG/5ML syrup Hydromet 5 mg-1.5 mg/5 mL oral syrup  TK 5 ML PO QHS PRN COU    . HYDROcodone-acetaminophen (NORCO/VICODIN) 5-325 MG tablet Take 1 tablet by mouth every 6 (six) hours as needed.    . INS SYRINGE/NEEDLE 1CC/28G (B-D INSULIN SYRINGE 1CC/28G) 28G X 1/2" 1 ML MISC USE TO ADMINISTER INSULIN DAILY 100 each 0  . insulin NPH-regular Human (70-30) 100 UNIT/ML injection Inject 23 Units into the skin in the morning and at bedtime.     .Marland Kitchenlatanoprost (XALATAN) 0.005 % ophthalmic solution Place 2 drops into both eyes at bedtime.   5  . metoprolol succinate (TOPROL-XL) 100 MG 24 hr tablet Take 1 tablet (100 mg total) by mouth daily. Take with or immediately following a meal. 90 tablet  3  . Multiple Vitamin (MULTIVITAMIN WITH MINERALS) TABS tablet Take 1 tablet by mouth daily.    . nitroGLYCERIN (NITROSTAT) 0.4 MG SL tablet Place 1 tablet (0.4 mg total) under the tongue every 5 (five) minutes as needed for chest pain. MAXIMUM 3 TABLETS 50 tablet 3  . ONE TOUCH ULTRA TEST test strip USE 1 STRIP TO TEST THREE TIMES DAILY AS DIRECTED 100 each 0  . ONETOUCH DELICA LANCETS 67H MISC Use three times daily to check blood sugar. 100 each 11  . pantoprazole (PROTONIX) 40 MG tablet Take 40 mg by mouth daily.    . silver sulfADIAZINE (SILVADENE) 1 % cream Apply topically.    . SSD, SILVER SULFADIAZINE, EX Apply 50 g topically as needed.    . tamsulosin (FLOMAX) 0.4 MG CAPS capsule Take 0.4 mg by mouth daily after breakfast.    . timolol (TIMOPTIC) 0.5 % ophthalmic  solution Place 2 drops into both eyes daily.   2  . torsemide (DEMADEX) 20 MG tablet Take 20 mg by mouth every other day.    . traZODone (DESYREL) 50 MG tablet trazodone 50 mg tablet    . vitamin B-12 (CYANOCOBALAMIN) 500 MCG tablet Take 500 mcg by mouth daily.    . vitamin E 180 MG (400 UNITS) capsule Take 400 Units by mouth daily.    . potassium chloride (KLOR-CON) 10 MEQ tablet Take 1 tablet (10 mEq total) by mouth daily. 90 tablet 3   No current facility-administered medications for this visit.    Past Medical History:  Diagnosis Date  . Arthritis   . BPH (benign prostatic hyperplasia)   . Carotid artery stenosis 11/2010   <50% bilaterally  . COPD (chronic obstructive pulmonary disease) ([place])    noted CT 10/05/16 former smoker quit age 55   . CVA (cerebral infarction) 11/2010   r thalamic lacunar  . Diabetes mellitus   . GERD (gastroesophageal reflux disease)   . Hyperlipidemia   . Hypertension   . Neuromuscular disorder ([place])   . Peripheral vascular disease in diabetes mellitus ([place]) 06/2012    95% occlusion s/p PTCA R SFA Tenasia Aull Sept 2013  . Stroke [facility])     Past Surgical History:  Procedure Laterality Date  . CHOLECYSTECTOMY N/A 11/13/2016   Procedure: LAPAROSCOPIC CHOLECYSTECTOMY WITH INTRAOPERATIVE CHOLANGIOGRAM;  Surgeon: Olean Ree, MD;  Location: [facility] ORS;  Service: General;  Laterality: N/A;  . COLONOSCOPY WITH PROPOFOL N/A 02/10/2018   Procedure: COLONOSCOPY WITH PROPOFOL;  Surgeon: Lucilla Lame, MD;  Location: [facility] ENDOSCOPY;  Service: Endoscopy;  Laterality: N/A;  . ERCP N/A 11/17/2016   Procedure: ENDOSCOPIC RETROGRADE CHOLANGIOPANCREATOGRAPHY (ERCP);  Surgeon: Irene Shipper, MD;  Location: [facility] ENDOSCOPY;  Service: Endoscopy;  Laterality: N/A;  . ERCP N/A 02/04/2017   Procedure: ENDOSCOPIC RETROGRADE CHOLANGIOPANCREATOGRAPHY (ERCP) Stent removal;  Surgeon: Lucilla Lame, MD;  Location: [facility] ENDOSCOPY;  Service: Endoscopy;  Laterality: N/A;  . IR GENERIC HISTORICAL   10/06/2016   IR PERC CHOLECYSTOSTOMY 10/06/2016 Aletta Edouard, MD [facility]-INTERV RAD  . JOINT REPLACEMENT Left 2014   left knee  . RIGHT/LEFT HEART CATH AND CORONARY ANGIOGRAPHY N/A 11/24/2020   Procedure: RIGHT/LEFT HEART CATH AND CORONARY ANGIOGRAPHY;  Surgeon: Minna Merritts, MD;  Location: [facility];  Service: Cardiovascular;  Laterality: N/A;  . TOTAL KNEE ARTHROPLASTY Right 08/08/2020   Procedure: TOTAL KNEE ARTHROPLASTY;  Surgeon: Lovell Sheehan, MD;  Location: [facility] ORS;  Service: Orthopedics;  Laterality: Right;  . UPPER GASTROINTESTINAL ENDOSCOPY    .  WRIST SURGERY       Social History   Tobacco Use  . Smoking status: Former Smoker    Packs/day: 0.50    Years: 33.00    Pack years: 16.50    Types: Cigarettes    Start date: 12/16/1934    Quit date: 04/21/1968    Years since quitting: 52.9  . Smokeless tobacco: Never Used  Vaping Use  . Vaping Use: Never used  Substance Use Topics  . Alcohol use: No  . Drug use: No      Family History  Problem Relation Age of Onset  . Diabetes Mother   no bleeding or clotting disorders, no aneurysms  Allergies  Allergen Reactions  . Metformin And Related Diarrhea     REVIEW OF SYSTEMS (Negative unless checked)  Constitutional: '[]' Weight loss  '[]' Fever  '[]' Chills Cardiac: '[]' Chest pain   '[]' Chest pressure   '[]' Palpitations   '[]' Shortness of breath when laying flat   '[]' Shortness of breath at rest   '[x]' Shortness of breath with exertion. Vascular:  '[]' Pain in legs with walking   '[]' Pain in legs at rest   '[]' Pain in legs when laying flat   '[]' Claudication   '[]' Pain in feet when walking  '[]' Pain in feet at rest  '[]' Pain in feet when laying flat   '[]' History of DVT   '[]' Phlebitis   '[x]' Swelling in legs   '[]' Varicose veins   '[x]' Non-healing ulcers Pulmonary:   '[]' Uses home oxygen   '[]' Productive cough   '[]' Hemoptysis   '[]' Wheeze  '[]' COPD   '[]' Asthma Neurologic:  '[]' Dizziness  '[]' Blackouts   '[]' Seizures   '[]' History of stroke   '[]' History of TIA  '[]' Aphasia    '[]' Temporary blindness   '[]' Dysphagia   '[]' Weakness or numbness in arms   '[]' Weakness or numbness in legs Musculoskeletal:  '[x]' Arthritis   '[]' Joint swelling   '[x]' Joint pain   '[]' Low back pain Hematologic:  '[]' Easy bruising  '[]' Easy bleeding   '[]' Hypercoagulable state   '[]' Anemic  '[]' Hepatitis Gastrointestinal:  '[]' Blood in stool   '[]' Vomiting blood  '[]' Gastroesophageal reflux/heartburn   '[]' Difficulty swallowing. Genitourinary:  '[]' Chronic kidney disease   '[]' Difficult urination  '[]' Frequent urination  '[]' Burning with urination   '[]' Blood in urine Skin:  '[]' Rashes   '[x]' Ulcers   '[x]' Wounds Psychological:  '[]' History of anxiety   '[]'  History of major depression.  Physical Examination  Vitals:   03/27/21 1412  BP: 113/69  Pulse: 81  Weight: 198 lb (89.8 kg)  Height: '5\' 11"'  (1.803 m)   Body mass index is 27.62 kg/m. Gen:  WD/WN, NAD Head: [place]/AT, No temporalis wasting. Ear/Nose/Throat: Hearing grossly intact, nares w/o erythema or drainage, trachea midline Eyes: Conjunctiva clear. Sclera non-icteric Neck: Supple.  No bruit  Pulmonary:  Good air movement, equal and clear to auscultation bilaterally.  Cardiac: RRR, No JVD Vascular:  Vessel Right Left  Radial Palpable Palpable                          PT Not Palpable Not Palpable  DP Not Palpable Not Palpable    Musculoskeletal: M/S 5/5 throughout.  No deformity or atrophy.  Moderate erythema with diffuse superficial ulcerations throughout both lower legs.  Serous fluid is weeping from both legs.  2-3+ bilateral lower extremity edema. Neurologic: CN 2-12 intact. Sensation grossly intact in extremities.  Symmetrical.  Speech is fluent. Motor exam as listed above. Psychiatric: Judgment intact, Mood & affect appropriate for pt's clinical situation. Dermatologic: bilateral lower leg ulcerations as above  CBC Lab Results  Component Value Date   WBC 6.7 11/17/2020   HGB 12.0 (L) 11/17/2020   HCT 37.4 (L) 11/17/2020   MCV 81 11/17/2020   PLT 242  11/17/2020    BMET    Component Value Date/Time   NA 142 11/17/2020 1135   NA 136 08/27/2012 0453   K 4.6 11/17/2020 1135   K 4.6 08/27/2012 0453   CL 107 (H) 11/17/2020 1135   CL 103 08/27/2012 0453   CO2 21 11/17/2020 1135   CO2 27 08/27/2012 0453   GLUCOSE 147 (H) 11/17/2020 1135   GLUCOSE 191 (H) 08/09/2020 0641   GLUCOSE 144 (H) 08/27/2012 0453   BUN 17 11/17/2020 1135   BUN 20 (H) 08/27/2012 0453   CREATININE 1.21 11/17/2020 1135   CREATININE 1.56 (H) 10/07/2017 1410   CALCIUM 9.1 11/17/2020 1135   CALCIUM 7.9 (L) 08/27/2012 0453   GFRNONAA 54 (L) 11/17/2020 1135   GFRNONAA >60 08/09/2020 0641   GFRNONAA >60 08/27/2012 0453   GFRNONAA 59 (L) 04/07/2012 1619   GFRAA 63 11/17/2020 1135   GFRAA >60 08/27/2012 0453   GFRAA 68 04/07/2012 1619   CrCl cannot be calculated (Patient's most recent lab result is older than the maximum 21 days allowed.).  COAG Lab Results  Component Value Date   INR 1.0 08/03/2020   INR 1.0 06/09/2020   INR 1.22 11/21/2016    Radiology No results found.   Assessment/Plan Peripheral vascular disease in diabetes mellitus ([place])  He had ABIs done today which were 1.02 on the right and 0.96 on the left with digital pressures of 52 on the right and 61 on the left which were mildly reduced from studies last year. Stable from last year. Recheck in one year. Has adequate perfusion for UNNA boots.   Hypertension blood pressure control important in reducing the progression of atherosclerotic disease. On appropriate oral medications.   Diabetic neuropathy blood glucose control important in reducing the progression of atherosclerotic disease. Also, involved in wound healing. On appropriate medications.   Carotid artery stenosis with cerebral infarction (HCC) Duplex today shows stable velocities in the 40 to 59% range on the right and in the 40 to 59% range on the left without significant progression from previous studies.  No role for  intervention.  Recheck in 1 year.  Continue current medical regimen.  Hyperlipidemia lipid control important in reducing the progression of atherosclerotic disease. Continue statin therapy   Swelling of limb Marked.  With weeping and ulceration in both legs.  We are going to place him in a 3 layer Unna boot today bilaterally, and these will be changed weekly for 3 to 4 weeks.  We will then reassess his wounds.    Leotis Pain, MD  03/27/2021 3:55 PM    This note was created with Dragon medical transcription system.  Any errors from dictation are purely unintentional

## 2021-03-27 NOTE — Assessment & Plan Note (Signed)
Duplex today shows stable velocities in the 40 to 59% range on the right and in the 40 to 59% range on the left without significant progression from previous studies.  No role for intervention.  Recheck in 1 year.  Continue current medical regimen.

## 2021-03-27 NOTE — Assessment & Plan Note (Signed)
He had ABIs done today which were 1.02 on the right and 0.96 on the left with digital pressures of 52 on the right and 61 on the left which were mildly reduced from studies last year. Stable from last year. Recheck in one year. Has adequate perfusion for UNNA boots.

## 2021-03-27 NOTE — Assessment & Plan Note (Signed)
blood pressure control important in reducing the progression of atherosclerotic disease. On appropriate oral medications.  

## 2021-03-29 ENCOUNTER — Ambulatory Visit (INDEPENDENT_AMBULATORY_CARE_PROVIDER_SITE_OTHER): Payer: Medicare Other | Admitting: Nurse Practitioner

## 2021-03-29 ENCOUNTER — Other Ambulatory Visit: Payer: Self-pay

## 2021-03-29 ENCOUNTER — Telehealth (INDEPENDENT_AMBULATORY_CARE_PROVIDER_SITE_OTHER): Payer: Self-pay | Admitting: Nurse Practitioner

## 2021-03-29 ENCOUNTER — Encounter (INDEPENDENT_AMBULATORY_CARE_PROVIDER_SITE_OTHER): Payer: Self-pay

## 2021-03-29 VITALS — BP 113/61 | HR 82 | Resp 16

## 2021-03-29 DIAGNOSIS — R600 Localized edema: Secondary | ICD-10-CM | POA: Diagnosis not present

## 2021-03-29 DIAGNOSIS — M7989 Other specified soft tissue disorders: Secondary | ICD-10-CM

## 2021-03-29 NOTE — Progress Notes (Signed)
History of Present Illness  There is no documented history at this time  Assessments & Plan   There are no diagnoses linked to this encounter.    Additional instructions  Subjective:  Patient presents with venous ulcer of the Bilateral lower extremity.    Procedure:  3 layer unna wrap was placed Bilateral lower extremity.   Plan:   Follow up in one week.   Patient wraps were removed and change to calamine.

## 2021-03-29 NOTE — Telephone Encounter (Signed)
Patient called in stating his leg is stinging and burning. Patient needs advice on why its doing this and if he needs to come in before his Wednesday appointment for his unna wrap. Please advise.

## 2021-03-29 NOTE — Telephone Encounter (Signed)
Patient will be coming in for unna wrap change (calamine) to if this helps with stinging and burning. If the patient continues with same symptoms he should contact his PCP for follow up. Patient was made aware with medical recommendations.

## 2021-04-01 ENCOUNTER — Encounter (INDEPENDENT_AMBULATORY_CARE_PROVIDER_SITE_OTHER): Payer: Self-pay | Admitting: Nurse Practitioner

## 2021-04-04 ENCOUNTER — Other Ambulatory Visit: Payer: Self-pay

## 2021-04-04 ENCOUNTER — Telehealth: Payer: Self-pay | Admitting: Cardiovascular Disease

## 2021-04-04 ENCOUNTER — Ambulatory Visit (INDEPENDENT_AMBULATORY_CARE_PROVIDER_SITE_OTHER): Payer: Medicare Other | Admitting: Nurse Practitioner

## 2021-04-04 VITALS — BP 100/66 | HR 84 | Ht 71.0 in | Wt 195.0 lb

## 2021-04-04 DIAGNOSIS — I83009 Varicose veins of unspecified lower extremity with ulcer of unspecified site: Secondary | ICD-10-CM

## 2021-04-04 DIAGNOSIS — L97909 Non-pressure chronic ulcer of unspecified part of unspecified lower leg with unspecified severity: Secondary | ICD-10-CM | POA: Diagnosis not present

## 2021-04-04 NOTE — Progress Notes (Signed)
History of Present Illness  There is no documented history at this time  Assessments & Plan   There are no diagnoses linked to this encounter.    Additional instructions  Subjective:  Patient presents with venous ulcer of the Bilateral lower extremity.    Procedure:  3 layer unna wrap was placed Bilateral lower extremity.   Plan:   Follow up in one week.  

## 2021-04-04 NOTE — Telephone Encounter (Signed)
Was able to reach back to Henry Lindsey regarding his "weeping" of his legs, he reports it is slightly getting worse. Advised that he should elevated his legs, compression hose or ace wraps, reduce salt intake, and keep the ear very clean and dry.   Suggested need for antibiotics to reduce infection if he is weeping, can results into open sores, skin becomes vunerable when weeping, Henry Lindsey reports has seen PCP regarding his weeping legs and they gace him diclofenac. Advised pt that he would probably need a different medication such as an antibiotic for possible cellulitis especially if he already has an ulcer to his BLE.   Advised to call PCP in regards to weeping, leg ulcer, and need for in-person evaluation. Pt requested wound clinic, suggested PCP Dr. Edwina Barth to place referral if needed to be seen by a specialist. Henry Lindsey reports understanding, stated he would call PCP despite his comment "they never know what to do or can get this right", my apologies given to pt, still advised on calling to get an appt or if they can call in an ABX. Henry Lindsey agrees and will call PCP office.

## 2021-04-04 NOTE — Telephone Encounter (Signed)
Patient calling  States his legs are weeping Would like to come to office today or tomorrow to get checked out, nothing available at this time Please call to discuss

## 2021-04-09 ENCOUNTER — Other Ambulatory Visit: Payer: Self-pay

## 2021-04-09 ENCOUNTER — Encounter (INDEPENDENT_AMBULATORY_CARE_PROVIDER_SITE_OTHER): Payer: Self-pay | Admitting: Nurse Practitioner

## 2021-04-09 ENCOUNTER — Encounter: Payer: Medicare Other | Attending: Physician Assistant | Admitting: Physician Assistant

## 2021-04-09 DIAGNOSIS — I890 Lymphedema, not elsewhere classified: Secondary | ICD-10-CM | POA: Diagnosis not present

## 2021-04-09 DIAGNOSIS — I872 Venous insufficiency (chronic) (peripheral): Secondary | ICD-10-CM | POA: Insufficient documentation

## 2021-04-09 DIAGNOSIS — Z09 Encounter for follow-up examination after completed treatment for conditions other than malignant neoplasm: Secondary | ICD-10-CM | POA: Insufficient documentation

## 2021-04-09 DIAGNOSIS — E11622 Type 2 diabetes mellitus with other skin ulcer: Secondary | ICD-10-CM | POA: Insufficient documentation

## 2021-04-09 DIAGNOSIS — L97822 Non-pressure chronic ulcer of other part of left lower leg with fat layer exposed: Secondary | ICD-10-CM | POA: Diagnosis not present

## 2021-04-09 NOTE — Progress Notes (Signed)
ISMAEEL, ARVELO ([id number]) Visit Report for 04/09/2021 Abuse/Suicide Risk Screen Details Patient Name: Henry Lindsey, Henry P. Date of Service: 04/09/2021 12:45 PM Medical Record Number: [id number] Patient Account Number: [id number] Date of Birth/Sex: June 27, 1933 (87 y.o. M) Treating RN: Dolan Amen Primary Care Makenleigh Crownover: Harrel Lemon Other Clinician: Referring Zehra Rucci: Leotis Pain Treating Lavonna Lampron/Extender: Skipper Cliche in Treatment: 0 Abuse/Suicide Risk Screen Items Answer ABUSE RISK SCREEN: Has anyone close to you tried to hurt or harm you recentlyo No Do you feel uncomfortable with anyone in your familyo No Has anyone forced you do things that you didnot want to doo No Electronic Signature(s) Signed: 04/09/2021 4:29:35 PM By: Georges Mouse, Minus Breeding RN Entered By: Georges Mouse, Kenia on 04/09/2021 13:13:06 Sharrar, Khori P. ([id number]) -------------------------------------------------------------------------------- Activities of Daily Living Details Patient Name: Gjerde, Khadir P. Date of Service: 04/09/2021 12:45 PM Medical Record Number: [id number] Patient Account Number: [id number] Date of Birth/Sex: 12-24-1932 (87 y.o. M) Treating RN: Dolan Amen Primary Care Chena Chohan: Harrel Lemon Other Clinician: Referring Javana Schey: Leotis Pain Treating Jesusita Jocelyn/Extender: Skipper Cliche in Treatment: 0 Activities of Daily Living Items Answer Activities of Daily Living (Please select one for each item) Drive Automobile Completely Able Take Medications Completely Able Use Telephone Completely Able Care for Appearance Completely Able Use Toilet Completely Able Bath / Shower Completely Able Dress Self Completely Able Feed Self Completely Able Walk Completely Able Get In / Out Bed Completely Able Housework Completely Able Prepare Meals Completely Roy for Self Completely Able Electronic Signature(s) Signed: 04/09/2021 4:29:35 PM By: Georges Mouse, Minus Breeding RN Entered By: Georges Mouse, Kenia on 04/09/2021 13:13:27 Egler, Samik P. ([id number]) -------------------------------------------------------------------------------- Education Screening Details Patient Name: Regala, Eugean P. Date of Service: 04/09/2021 12:45 PM Medical Record Number: [id number] Patient Account Number: [id number] Date of Birth/Sex: 1933-05-26 (87 y.o. M) Treating RN: Dolan Amen Primary Care Blanche Gallien: Harrel Lemon Other Clinician: Referring Amel Kitch: Leotis Pain Treating Balbina Depace/Extender: Skipper Cliche in Treatment: 0 Primary Learner Assessed: Patient Learning Preferences/Education Level/Primary Language Learning Preference: Explanation, Demonstration Highest Education Level: College or Above Preferred Language: English Cognitive Barrier Language Barrier: No Translator Needed: No Memory Deficit: No Emotional Barrier: No Cultural/Religious Beliefs Affecting Medical Care: No Physical Barrier Impaired Vision: No Impaired Hearing: No Decreased Hand dexterity: No Knowledge/Comprehension Knowledge Level: Medium Comprehension Level: Medium Ability to understand written instructions: Medium Ability to understand verbal instructions: Medium Motivation Anxiety Level: Calm Cooperation: Cooperative Education Importance: Acknowledges Need Interest in Health Problems: Asks Questions Perception: Coherent Willingness to Engage in Self-Management Medium Activities: Readiness to Engage in Self-Management Medium Activities: Electronic Signature(s) Signed: 04/09/2021 4:29:35 PM By: Georges Mouse, Minus Breeding RN Entered By: Georges Mouse, Kenia on 04/09/2021 13:13:58 Gargan, Brylin P. ([id number]) -------------------------------------------------------------------------------- Fall Risk Assessment Details Patient Name: Boye, Dorwin P. Date of Service: 04/09/2021 12:45 PM Medical Record Number: [id number] Patient Account Number: [id number] Date of  Birth/Sex: Dec 20, 1932 (87 y.o. M) Treating RN: Dolan Amen Primary Care Jozette Castrellon: Harrel Lemon Other Clinician: Referring Deserea Bordley: Leotis Pain Treating Kairav Russomanno/Extender: Skipper Cliche in Treatment: 0 Fall Risk Assessment Items Have you had 2 or more falls in the last 12 monthso 0 No Have you had any fall that resulted in injury in the last 12 monthso 0 No FALLS RISK SCREEN History of falling - immediate or within 3 months 0 No Secondary diagnosis (Do you have 2 or more medical diagnoseso) 15 Yes Ambulatory aid None/bed rest/wheelchair/nurse 0 Yes Crutches/cane/walker 0 No Furniture 0 No Intravenous therapy Access/Saline/Heparin Lock 0 No Gait/Transferring Normal/ bed rest/  wheelchair 0 Yes Weak (short steps with or without shuffle, stooped but able to lift head while walking, may 0 No seek support from furniture) Impaired (short steps with shuffle, may have difficulty arising from chair, head down, impaired 0 No balance) Mental Status Oriented to own ability 0 Yes Electronic Signature(s) Signed: 04/09/2021 4:29:35 PM By: Georges Mouse, Minus Breeding RN Entered By: Georges Mouse, Kenia on 04/09/2021 13:14:12 Stefan, Zayvien P. ([id number]) -------------------------------------------------------------------------------- Foot Assessment Details Patient Name: Kessinger, Pheng P. Date of Service: 04/09/2021 12:45 PM Medical Record Number: [id number] Patient Account Number: [id number] Date of Birth/Sex: Jan 30, 1933 (87 y.o. M) Treating RN: Dolan Amen Primary Care Karo Rog: Harrel Lemon Other Clinician: Referring Delesa Kawa: Leotis Pain Treating Ryon Layton/Extender: Skipper Cliche in Treatment: 0 Foot Assessment Items Site Locations + = Sensation present, - = Sensation absent, C = Callus, U = Ulcer R = Redness, W = Warmth, M = Maceration, PU = Pre-ulcerative lesion F = Fissure, S = Swelling, D = Dryness Assessment Right: Left: Other Deformity: No No Prior Foot Ulcer: No  No Prior Amputation: No No Charcot Joint: No No Ambulatory Status: Ambulatory Without Help Gait: Steady Electronic Signature(s) Signed: 04/09/2021 4:29:35 PM By: Georges Mouse, Minus Breeding RN Entered By: Georges Mouse, Minus Breeding on 04/09/2021 13:15:00 Inboden, Ren P. ([id number]) -------------------------------------------------------------------------------- Nutrition Risk Screening Details Patient Name: Wauneka, Cassell P. Date of Service: 04/09/2021 12:45 PM Medical Record Number: [id number] Patient Account Number: [id number] Date of Birth/Sex: [birth year] (87 y.o. M) Treating RN: Dolan Amen Primary Care Avyan Livesay: Harrel Lemon Other Clinician: Referring Tahirah Sara: Leotis Pain Treating Latressa Harries/Extender: Jeri Cos Weeks in Treatment: 0 Height (in): 71 Weight (lbs): 195 Body Mass Index (BMI): 27.2 Nutrition Risk Screening Items Score Screening NUTRITION RISK SCREEN: I have an illness or condition that made me change the kind and/or amount of food I eat 0 No I eat fewer than two meals per day 0 No I eat few fruits and vegetables, or milk products 0 No I have three or more drinks of beer, liquor or wine almost every day 0 No I have tooth or mouth problems that make it hard for me to eat 0 No I don't always have enough money to buy the food I need 0 No I eat alone most of the time 0 No I take three or more different prescribed or over-the-counter drugs a day 1 Yes Without wanting to, I have lost or gained 10 pounds in the last six months 0 No I am not always physically able to shop, cook and/or feed myself 0 No Nutrition Protocols Good Risk Protocol 0 No interventions needed Moderate Risk Protocol High Risk Proctocol Risk Level: Good Risk Score: 1 Electronic Signature(s) Signed: 04/09/2021 4:29:35 PM By: Georges Mouse, Minus Breeding RN Entered By: Georges Mouse, Minus Breeding on 04/09/2021 13:14:53

## 2021-04-10 NOTE — Progress Notes (Signed)
TORYN, MCCLINTON ([id number]) Visit Report for 04/09/2021 Chief Complaint Document Details Patient Name: Henry Lindsey, Henry P. Date of Service: 04/09/2021 12:45 PM Medical Record Number: [id number] Patient Account Number: [id number] Date of Birth/Sex: 1933/02/22 (87 y.o. M) Treating RN: Donnamarie Poag Primary Care Provider: Harrel Lemon Other Clinician: Referring Provider: Leotis Pain Treating Provider/Extender: Skipper Cliche in Treatment: 0 Information Obtained from: Patient Chief Complaint Left leg ulcer Electronic Signature(s) Signed: 04/09/2021 1:31:19 PM By: Worthy Keeler PA-C Entered By: Worthy Keeler on 04/09/2021 13:31:18 Elem, Kazuma P. ([id number]) -------------------------------------------------------------------------------- HPI Details Patient Name: Lindsey, Henry P. Date of Service: 04/09/2021 12:45 PM Medical Record Number: [id number] Patient Account Number: [id number] Date of Birth/Sex: [birth year] (87 y.o. M) Treating RN: Donnamarie Poag Primary Care Provider: Harrel Lemon Other Clinician: Referring Provider: Leotis Pain Treating Provider/Extender: Skipper Cliche in Treatment: 0 History of Present Illness HPI Description: 04/09/2021 upon evaluation today patient presents for initial evaluation here in our clinic concerning issues he has been having with a wound on his left leg. Both legs have actually been giving him trouble. He does have an ABI which was performed previously at vascular. This showed that he had a right ABI 1.02 with a TBI of 0.44 and a left ABI of 0.97 with a TBI of 0.51. With this being said he may have some notations with regard to his blood flow but nothing so significant that I think he could not heal. I also think he could Tolerate compression. With that being said the big thing they are just making sure that we can get him to the point where he is not weeping or leaking so that he can wear the compression without complication. He has a lot of dry flaky  skin noted where obviously there is been some issues going on here. The patient does have a history of chronic venous insufficiency, lymphedema, and diabetes mellitus type 2. Electronic Signature(s) Signed: 04/09/2021 5:45:54 PM By: Worthy Keeler PA-C Entered By: Worthy Keeler on 04/09/2021 17:45:54 Hemrick, Jagdeep P. ([id number]) -------------------------------------------------------------------------------- Physical Exam Details Patient Name: Lindsey, Henry P. Date of Service: 04/09/2021 12:45 PM Medical Record Number: [id number] Patient Account Number: [id number] Date of Birth/Sex: 1933/09/09 (87 y.o. M) Treating RN: Donnamarie Poag Primary Care Provider: Harrel Lemon Other Clinician: Referring Provider: Leotis Pain Treating Provider/Extender: Skipper Cliche in Treatment: 0 Constitutional sitting or standing blood pressure is within target range for patient.. pulse regular and within target range for patient.[name] respirations regular, non- labored and within target range for patient.[name] temperature within target range for patient.. Well-nourished and well-hydrated in no acute distress. Eyes conjunctiva clear no eyelid edema noted. pupils equal round and reactive to light and accommodation. Ears, Nose, Mouth, and Throat no gross abnormality of ear auricles or external auditory canals. normal hearing noted during conversation. mucus membranes moist. Respiratory normal breathing without difficulty. Cardiovascular 2+ dorsalis pedis/posterior tibialis pulses. 1+ pitting edema of the bilateral lower extremities. Musculoskeletal normal gait and posture. no significant deformity or arthritic changes, no loss or range of motion, no clubbing. Psychiatric this patient is able to make decisions and demonstrates good insight into disease process. Alert and Oriented x 3. pleasant and cooperative. Notes Upon inspection patient's wound bed actually showed signs of good granulation epithelization at this  point. There does not appear to be any signs of active infection which is great news and overall very pleased with where things stand today. Based on what I am seeing I do feel like that the patient is  actually doing quite well in regard to his legs especially in relation to what he tells me he was going on. Nonetheless I do believe that the patient is experiencing improvement. There is no signs of active infection at this time. Electronic Signature(s) Signed: 04/09/2021 5:51:42 PM By: Worthy Keeler PA-C Entered By: Worthy Keeler on 04/09/2021 17:51:42 Kriegel, Trip P. ([id number]) -------------------------------------------------------------------------------- Physician Orders Details Patient Name: Lindsey, Henry P. Date of Service: 04/09/2021 12:45 PM Medical Record Number: [id number] Patient Account Number: [id number] Date of Birth/Sex: 09-01-1933 (87 y.o. M) Treating RN: Donnamarie Poag Primary Care Provider: Harrel Lemon Other Clinician: Referring Provider: Leotis Pain Treating Provider/Extender: Skipper Cliche in Treatment: 0 Verbal / Phone Orders: No Diagnosis Coding ICD-10 Coding Code Description I87.2 Venous insufficiency (chronic) (peripheral) I89.0 Lymphedema, not elsewhere classified L97.822 Non-pressure chronic ulcer of other part of left lower leg with fat layer exposed E11.622 Type 2 diabetes mellitus with other skin ulcer Follow-up Appointments o Return Appointment in 1 week. o Nurse Visit as needed Medications-Please add to medication list. o Other: - Lotion to bilateral legs with TCA cream to both legs and secured with TUBI E double layer--keep in place until changed at wound center in one week Wound Treatment Wound #1 - Lower Leg Wound Laterality: Left, Anterior Cleanser: Soap and Water 1 x Per Week/30 Days Discharge Instructions: Gently cleanse wound with antibacterial soap, rinse and pat dry prior to dressing wounds Topical: Triamcinolone Acetonide Cream,  0.1%, 15 (g) tube 1 x Per Week/30 Days Discharge Instructions: Apply as directed by provider. Primary Dressing: Xeroform 4x4-HBD (in/in) 1 x Per Week/30 Days Discharge Instructions: Apply Xeroform 4x4-HBD (in/in) as directed Secured With: Kerlix Roll Sterile or Non-Sterile 6-ply 4.5x4 (yd/yd) 1 x Per Week/30 Days Discharge Instructions: LIGHTLY SECURED ONLY OVER WOUND AREA Apply Kerlix as directed Secured With: Tubigrip Size E, 3.5x10 (in/yds) 1 x Per Week/30 Days Discharge Instructions: DOUBLE LAYER Apply 3 Tubigrip E 3-finger-widths below knee to base of toes to secure dressing and/or for swelling. Electronic Signature(s) Signed: 04/09/2021 3:06:19 PM By: Donnamarie Poag Signed: 04/09/2021 6:32:22 PM By: Worthy Keeler PA-C Entered By: Donnamarie Poag on 04/09/2021 13:44:29 Hagemann, Kannan P. ([id number]) -------------------------------------------------------------------------------- Problem List Details Patient Name: Urbanczyk, Adith P. Date of Service: 04/09/2021 12:45 PM Medical Record Number: [id number] Patient Account Number: [id number] Date of Birth/Sex: Apr 08, 1933 (87 y.o. M) Treating RN: Donnamarie Poag Primary Care Provider: Harrel Lemon Other Clinician: Referring Provider: Leotis Pain Treating Provider/Extender: Skipper Cliche in Treatment: 0 Active Problems ICD-10 Encounter Code Description Active Date MDM Diagnosis I87.2 Venous insufficiency (chronic) (peripheral) 04/09/2021 No Yes I89.0 Lymphedema, not elsewhere classified 04/09/2021 No Yes L97.822 Non-pressure chronic ulcer of other part of left lower leg with fat layer 04/09/2021 No Yes exposed E11.622 Type 2 diabetes mellitus with other skin ulcer 04/09/2021 No Yes Inactive Problems Resolved Problems Electronic Signature(s) Signed: 04/09/2021 1:31:08 PM By: Worthy Keeler PA-C Entered By: Worthy Keeler on 04/09/2021 13:31:06 Vaquera, Keahi P.  ([id number]) -------------------------------------------------------------------------------- Progress Note Details Patient Name: Moorehead, Ranjit P. Date of Service: 04/09/2021 12:45 PM Medical Record Number: [id number] Patient Account Number: [id number] Date of Birth/Sex: 05-07-1933 (87 y.o. M) Treating RN: Donnamarie Poag Primary Care Provider: Harrel Lemon Other Clinician: Referring Provider: Leotis Pain Treating Provider/Extender: Skipper Cliche in Treatment: 0 Subjective Chief Complaint Information obtained from Patient Left leg ulcer History of Present Illness (HPI) 04/09/2021 upon evaluation today patient presents for initial evaluation here in our clinic concerning issues he has been having with a  wound on his left leg. Both legs have actually been giving him trouble. He does have an ABI which was performed previously at vascular. This showed that he had a right ABI 1.02 with a TBI of 0.44 and a left ABI of 0.97 with a TBI of 0.51. With this being said he may have some notations with regard to his blood flow but nothing so significant that I think he could not heal. I also think he could Tolerate compression. With that being said the big thing they are just making sure that we can get him to the point where he is not weeping or leaking so that he can wear the compression without complication. He has a lot of dry flaky skin noted where obviously there is been some issues going on here. The patient does have a history of chronic venous insufficiency, lymphedema, and diabetes mellitus type 2. Patient History Allergies No Known Drug Allergies Social History Never smoker, Marital Status - Single, Alcohol Use - Never, Drug Use - No History, Caffeine Use - Daily. Medical History Eyes Denies history of Cataracts, Glaucoma, Optic Neuritis Ear/Nose/Mouth/Throat Denies history of Chronic sinus problems/congestion, Middle ear problems Hematologic/Lymphatic Denies history of Anemia, Hemophilia,  Human Immunodeficiency Virus, Lymphedema, Sickle Cell Disease Respiratory Denies history of Aspiration, Asthma, Chronic Obstructive Pulmonary Disease (COPD), Pneumothorax, Sleep Apnea, Tuberculosis Cardiovascular Patient has history of Hypertension Denies history of Angina, Arrhythmia, Congestive Heart Failure, Coronary Artery Disease, Deep Vein Thrombosis, Hypotension, Myocardial Infarction, Peripheral Arterial Disease, Peripheral Venous Disease, Phlebitis, Vasculitis Gastrointestinal Denies history of Cirrhosis , Colitis, Crohn s, Hepatitis A, Hepatitis B, Hepatitis C Endocrine Patient has history of Type II Diabetes Denies history of Type I Diabetes Genitourinary Denies history of End Stage Renal Disease Immunological Denies history of Lupus Erythematosus, Raynaud s, Scleroderma Integumentary (Skin) Denies history of History of Burn, History of pressure wounds Musculoskeletal Denies history of Gout, Rheumatoid Arthritis, Osteoarthritis, Osteomyelitis Neurologic Patient has history of Neuropathy Denies history of Dementia, Quadriplegia, Seizure Disorder Oncologic Denies history of Received Chemotherapy, Received Radiation Psychiatric Denies history of Anorexia/bulimia, Confinement Anxiety Patient is treated with Insulin. Blood sugar is tested. Review of Systems (ROS) Constitutional Symptoms (General Health) Denies complaints or symptoms of Fatigue, Fever, Chills, Marked Weight Change. Eyes Denies complaints or symptoms of Dry Eyes, Vision Changes, Glasses / Contacts. Ear/Nose/Mouth/Throat Denies complaints or symptoms of Difficult clearing ears, Sinusitis. Marcoe, Yanixan P. ([id number]) Hematologic/Lymphatic Denies complaints or symptoms of Bleeding / Clotting Disorders, Human Immunodeficiency Virus. Respiratory Denies complaints or symptoms of Chronic or frequent coughs, Shortness of Breath. Cardiovascular Complains or has symptoms of LE edema. Denies complaints or symptoms  of Chest pain. Gastrointestinal Denies complaints or symptoms of Frequent diarrhea, Nausea, Vomiting. Endocrine Denies complaints or symptoms of Hepatitis, Thyroid disease, Polydypsia (Excessive Thirst). Genitourinary Denies complaints or symptoms of Kidney failure/ Dialysis, Incontinence/dribbling. Immunological Denies complaints or symptoms of Hives, Itching. Integumentary (Skin) Complains or has symptoms of Wounds, Swelling. Denies complaints or symptoms of Bleeding or bruising tendency, Breakdown. Musculoskeletal Denies complaints or symptoms of Muscle Pain, Muscle Weakness. Neurologic Denies complaints or symptoms of Numbness/parasthesias, Focal/Weakness. Psychiatric Denies complaints or symptoms of Anxiety, Claustrophobia. Objective Constitutional sitting or standing blood pressure is within target range for patient.. pulse regular and within target range for patient.[name] respirations regular, non- labored and within target range for patient.[name] temperature within target range for patient.. Well-nourished and well-hydrated in no acute distress. Vitals Time Taken: 1:03 PM, Height: 71 in, Source: Stated, Weight: 195 lbs, Source: Measured, BMI: 27.2, Temperature:  98.2 F, Pulse: 80 bpm, Respiratory Rate: 18 breaths/min, Blood Pressure: 120/55 mmHg. Eyes conjunctiva clear no eyelid edema noted. pupils equal round and reactive to light and accommodation. Ears, Nose, Mouth, and Throat no gross abnormality of ear auricles or external auditory canals. normal hearing noted during conversation. mucus membranes moist. Respiratory normal breathing without difficulty. Cardiovascular 2+ dorsalis pedis/posterior tibialis pulses. 1+ pitting edema of the bilateral lower extremities. Musculoskeletal normal gait and posture. no significant deformity or arthritic changes, no loss or range of motion, no clubbing. Psychiatric this patient is able to make decisions and demonstrates good insight into  disease process. Alert and Oriented x 3. pleasant and cooperative. General Notes: Upon inspection patient's wound bed actually showed signs of good granulation epithelization at this point. There does not appear to be any signs of active infection which is great news and overall very pleased with where things stand today. Based on what I am seeing I do feel like that the patient is actually doing quite well in regard to his legs especially in relation to what he tells me he was going on. Nonetheless I do believe that the patient is experiencing improvement. There is no signs of active infection at this time. Integumentary (Hair, Skin) Wound #1 status is Open. Original cause of wound was Gradually Appeared. The date acquired was: 03/27/2021. The wound is located on the Left,Anterior Lower Leg. The wound measures 0.3cm length x 1.6cm width x 0.1cm depth; 0.377cm^2 area and 0.038cm^3 volume. There is Fat Layer (Subcutaneous Tissue) exposed. There is no tunneling or undermining noted. There is a medium amount of serous drainage noted. There is medium (34-66%) red granulation within the wound bed. There is a medium (34-66%) amount of necrotic tissue within the wound bed including Adherent Slough. TSUGIO, ELISON ([id number]) Assessment Active Problems ICD-10 Venous insufficiency (chronic) (peripheral) Lymphedema, not elsewhere classified Non-pressure chronic ulcer of other part of left lower leg with fat layer exposed Type 2 diabetes mellitus with other skin ulcer Plan Follow-up Appointments: Return Appointment in 1 week. Nurse Visit as needed Medications-Please add to medication list.: Other: - Lotion to bilateral legs with TCA cream to both legs and secured with TUBI E double layer--keep in place until changed at wound center in one week WOUND #1: - Lower Leg Wound Laterality: Left, Anterior Cleanser: Soap and Water 1 x Per Week/30 Days Discharge Instructions: Gently cleanse wound with  antibacterial soap, rinse and pat dry prior to dressing wounds Topical: Triamcinolone Acetonide Cream, 0.1%, 15 (g) tube 1 x Per Week/30 Days Discharge Instructions: Apply as directed by provider. Primary Dressing: Xeroform 4x4-HBD (in/in) 1 x Per Week/30 Days Discharge Instructions: Apply Xeroform 4x4-HBD (in/in) as directed Secured With: Kerlix Roll Sterile or Non-Sterile 6-ply 4.5x4 (yd/yd) 1 x Per Week/30 Days Discharge Instructions: LIGHTLY SECURED ONLY OVER WOUND AREA Apply Kerlix as directed Secured With: Tubigrip Size E, 3.5x10 (in/yds) 1 x Per Week/30 Days Discharge Instructions: DOUBLE LAYER Apply 3 Tubigrip E 3-finger-widths below knee to base of toes to secure dressing and/or for swelling. 1. Would recommend that we go ahead and initiate treatment with triamcinolone and lotion to the legs bilaterally. This is to help with the dry skin and irritation. 2. I am also can recommend at this time that we have the patient go ahead and continue with the Xeroform gauze over the small area of this open followed by an ABD pad. 3. I would recommend bilateral Tubigrip I think this will be sufficient compression for now to keep  things under control hopefully he will be able to put his compression socks on next week I told him to bring those with him. We will see patient back for reevaluation in 1 week here in the clinic. If anything worsens or changes patient will contact our office for additional recommendations. Electronic Signature(s) Signed: 04/09/2021 5:52:24 PM By: Worthy Keeler PA-C Entered By: Worthy Keeler on 04/09/2021 17:52:24 Ottey, Noam P. ([id number]) -------------------------------------------------------------------------------- ROS/PFSH Details Patient Name: Dicesare, Jerel P. Date of Service: 04/09/2021 12:45 PM Medical Record Number: [id number] Patient Account Number: [id number] Date of Birth/Sex: 11/18/1932 (87 y.o. M) Treating RN: Dolan Amen Primary Care Provider:  Harrel Lemon Other Clinician: Referring Provider: Leotis Pain Treating Provider/Extender: Skipper Cliche in Treatment: 0 Constitutional Symptoms (General Health) Complaints and Symptoms: Negative for: Fatigue; Fever; Chills; Marked Weight Change Eyes Complaints and Symptoms: Negative for: Dry Eyes; Vision Changes; Glasses / Contacts Medical History: Negative for: Cataracts; Glaucoma; Optic Neuritis Ear/Nose/Mouth/Throat Complaints and Symptoms: Negative for: Difficult clearing ears; Sinusitis Medical History: Negative for: Chronic sinus problems/congestion; Middle ear problems Hematologic/Lymphatic Complaints and Symptoms: Negative for: Bleeding / Clotting Disorders; Human Immunodeficiency Virus Medical History: Negative for: Anemia; Hemophilia; Human Immunodeficiency Virus; Lymphedema; Sickle Cell Disease Respiratory Complaints and Symptoms: Negative for: Chronic or frequent coughs; Shortness of Breath Medical History: Negative for: Aspiration; Asthma; Chronic Obstructive Pulmonary Disease (COPD); Pneumothorax; Sleep Apnea; Tuberculosis Cardiovascular Complaints and Symptoms: Positive for: LE edema Negative for: Chest pain Medical History: Positive for: Hypertension Negative for: Angina; Arrhythmia; Congestive Heart Failure; Coronary Artery Disease; Deep Vein Thrombosis; Hypotension; Myocardial Infarction; Peripheral Arterial Disease; Peripheral Venous Disease; Phlebitis; Vasculitis Gastrointestinal Complaints and Symptoms: Negative for: Frequent diarrhea; Nausea; Vomiting Medical History: Negative for: Cirrhosis ; Colitis; Crohnos; Hepatitis A; Hepatitis B; Hepatitis C Endocrine Complaints and Symptoms: Negative for: Hepatitis; Thyroid disease; Polydypsia (Excessive Thirst) Medical History: MARCELL, CHAVARIN. ([id number]) Positive for: Type II Diabetes Negative for: Type I Diabetes Time with diabetes: 20 years Treated with: Insulin Blood sugar tested every day:  Yes Tested : Genitourinary Complaints and Symptoms: Negative for: Kidney failure/ Dialysis; Incontinence/dribbling Medical History: Negative for: End Stage Renal Disease Immunological Complaints and Symptoms: Negative for: Hives; Itching Medical History: Negative for: Lupus Erythematosus; Raynaudos; Scleroderma Integumentary (Skin) Complaints and Symptoms: Positive for: Wounds; Swelling Negative for: Bleeding or bruising tendency; Breakdown Medical History: Negative for: History of Burn; History of pressure wounds Musculoskeletal Complaints and Symptoms: Negative for: Muscle Pain; Muscle Weakness Medical History: Negative for: Gout; Rheumatoid Arthritis; Osteoarthritis; Osteomyelitis Neurologic Complaints and Symptoms: Negative for: Numbness/parasthesias; Focal/Weakness Medical History: Positive for: Neuropathy Negative for: Dementia; Quadriplegia; Seizure Disorder Psychiatric Complaints and Symptoms: Negative for: Anxiety; Claustrophobia Medical History: Negative for: Anorexia/bulimia; Confinement Anxiety Oncologic Medical History: Negative for: Received Chemotherapy; Received Radiation Immunizations Pneumococcal Vaccine: Received Pneumococcal Vaccination: Yes Implantable Devices None Dhondt, Zekiah P. ([id number]) Family and Social History Never smoker; Marital Status - Single; Alcohol Use: Never; Drug Use: No History; Caffeine Use: Daily Electronic Signature(s) Signed: 04/09/2021 4:29:35 PM By: Georges Mouse, Minus Breeding RN Signed: 04/09/2021 6:32:22 PM By: Worthy Keeler PA-C Entered By: Georges Mouse, Minus Breeding on 04/09/2021 13:13:00 Sanderson, Garrus P. ([id number]) -------------------------------------------------------------------------------- SuperBill Details Patient Name: Nidiffer, Alejandro P. Date of Service: 04/09/2021 Medical Record Number: [id number] Patient Account Number: [id number] Date of Birth/Sex: 05-16-1933 (87 y.o. M) Treating RN: Donnamarie Poag Primary Care  Provider: Harrel Lemon Other Clinician: Referring Provider: Leotis Pain Treating Provider/Extender: Skipper Cliche in Treatment: 0 Diagnosis Coding ICD-10 Codes Code Description I87.2 Venous insufficiency (chronic) (peripheral) I89.0 Lymphedema,  not elsewhere classified L97.822 Non-pressure chronic ulcer of other part of left lower leg with fat layer exposed E11.622 Type 2 diabetes mellitus with other skin ulcer Facility Procedures CPT4 Code: [id number] Description: [phone] - WOUND CARE VISIT-LEV 2 EST PT Modifier: Quantity: 1 Physician Procedures CPT4 Code: [id number] Description: WC PHYS LEVEL 3 o NEW PT Modifier: Quantity: 1 CPT4 Code: Description: ICD-10 Diagnosis Description I87.2 Venous insufficiency (chronic) (peripheral) I89.0 Lymphedema, not elsewhere classified L97.822 Non-pressure chronic ulcer of other part of left lower leg with fat l E11.622 Type 2 diabetes mellitus with  other skin ulcer Modifier: ayer exposed Quantity: Electronic Signature(s) Signed: 04/09/2021 5:52:48 PM By: Worthy Keeler PA-C Previous Signature: 04/09/2021 3:06:19 PM Version By: Donnamarie Poag Entered By: Worthy Keeler on 04/09/2021 17:52:48

## 2021-04-11 ENCOUNTER — Encounter (INDEPENDENT_AMBULATORY_CARE_PROVIDER_SITE_OTHER): Payer: Medicare Other

## 2021-04-11 NOTE — Progress Notes (Signed)
Henry Lindsey, Henry Lindsey ([id number]) Visit Report for 04/09/2021 Allergy List Details Patient Name: Henry Lindsey, Henry P. Date of Service: 04/09/2021 12:45 PM Medical Record Number: [id number] Patient Account Number: [id number] Date of Birth/Sex: 1933/05/07 (87 y.o. M) Treating RN: Dolan Amen Primary Care Requan Hardge: Harrel Lemon Other Clinician: Referring Lidia Clavijo: Leotis Pain Treating Adriena Manfre/Extender: Jeri Cos Weeks in Treatment: 0 Allergies Active Allergies No Known Drug Allergies Allergy Notes Electronic Signature(s) Signed: 04/09/2021 4:29:35 PM By: Georges Mouse, Minus Breeding RN Entered By: Georges Mouse, Kenia on 04/09/2021 13:06:58 Knisely, Yaw P. ([id number]) -------------------------------------------------------------------------------- Arrival Information Details Patient Name: Henry Lindsey, Henry P. Date of Service: 04/09/2021 12:45 PM Medical Record Number: [id number] Patient Account Number: [id number] Date of Birth/Sex: 1933-09-26 (87 y.o. M) Treating RN: Dolan Amen Primary Care Shahed Yeoman: Harrel Lemon Other Clinician: Referring Lonna Rabold: Leotis Pain Treating Shantrell Placzek/Extender: Skipper Cliche in Treatment: 0 Visit Information Patient Arrived: Ambulatory Arrival Time: 12:59 Accompanied By: self Transfer Assistance: None Patient Identification Verified: Yes Secondary Verification Process Completed: Yes Patient Has Alerts: Yes Patient Alerts: Type II Diabetic R. ABI 1.02 L. ABI 0.97 Electronic Signature(s) Signed: 04/09/2021 1:38:57 PM By: Georges Mouse, Minus Breeding RN Entered By: Georges Mouse, Kenia on 04/09/2021 13:38:56 Capano, Aniello Mamie Nick ([id number]) -------------------------------------------------------------------------------- Clinic Level of Care Assessment Details Patient Name: Henry Lindsey, Henry P. Date of Service: 04/09/2021 12:45 PM Medical Record Number: [id number] Patient Account Number: [id number] Date of Birth/Sex: 1933/09/27 (87 y.o. M) Treating RN: Donnamarie Poag Primary  Care Kaliope Quinonez: Harrel Lemon Other Clinician: Referring Pershing Skidmore: Leotis Pain Treating Kameka Whan/Extender: Skipper Cliche in Treatment: 0 Clinic Level of Care Assessment Items TOOL 1 Quantity Score []  - Use when EandM and Procedure is performed on INITIAL visit 0 ASSESSMENTS - Nursing Assessment / Reassessment []  - General Physical Exam (combine w/ comprehensive assessment (listed just below) when performed on new 0 pt. evals) X- 1 25 Comprehensive Assessment (HX, ROS, Risk Assessments, Wounds Hx, etc.) ASSESSMENTS - Wound and Skin Assessment / Reassessment []  - Dermatologic / Skin Assessment (not related to wound area) 0 ASSESSMENTS - Ostomy and/or Continence Assessment and Care []  - Incontinence Assessment and Management 0 []  - 0 Ostomy Care Assessment and Management (repouching, etc.) PROCESS - Coordination of Care X - Simple Patient / Family Education for ongoing care 1 15 []  - 0 Complex (extensive) Patient / Family Education for ongoing care X- 1 10 Staff obtains Programmer, systems, Records, Test Results / Process Orders []  - 0 Staff telephones HHA, Nursing Homes / Clarify orders / etc []  - 0 Routine Transfer to another Facility (non-emergent condition) []  - 0 Routine Hospital Admission (non-emergent condition) []  - 0 New Admissions / Biomedical engineer / Ordering NPWT, Apligraf, etc. []  - 0 Emergency Hospital Admission (emergent condition) PROCESS - Special Needs []  - Pediatric / Minor Patient Management 0 []  - 0 Isolation Patient Management []  - 0 Hearing / Language / Visual special needs []  - 0 Assessment of Community assistance (transportation, D/Henry Lindsey planning, etc.) []  - 0 Additional assistance / Altered mentation []  - 0 Support Surface(s) Assessment (bed, cushion, seat, etc.) INTERVENTIONS - Miscellaneous []  - External ear exam 0 []  - 0 Patient Transfer (multiple staff / Civil Service fast streamer / Similar devices) []  - 0 Simple Staple / Suture removal (25 or less) []  -  0 Complex Staple / Suture removal (26 or more) []  - 0 Hypo/Hyperglycemic Management (do not check if billed separately) []  - 0 Ankle / Brachial Index (ABI) - do not check if billed separately Has the patient been seen at the hospital within the last  three years: Yes Total Score: 50 Level Of Care: New/Established - Level 2 Henry Lindsey, Henry P. ([id number]) Electronic Signature(s) Signed: 04/09/2021 3:06:19 PM By: Donnamarie Poag Entered By: Donnamarie Poag on 04/09/2021 13:45:24 Henry Lindsey, Henry P. ([id number]) -------------------------------------------------------------------------------- Encounter Discharge Information Details Patient Name: Henry Lindsey, Henry P. Date of Service: 04/09/2021 12:45 PM Medical Record Number: [id number] Patient Account Number: [id number] Date of Birth/Sex: 03/14/1933 (87 y.o. M) Treating RN: Carlene Coria Primary Care Javier Mamone: Harrel Lemon Other Clinician: Referring Hatsumi Steinhart: Leotis Pain Treating Terral Cooks/Extender: Skipper Cliche in Treatment: 0 Encounter Discharge Information Items Discharge Condition: Stable Ambulatory Status: Ambulatory Discharge Destination: Home Transportation: Private Auto Accompanied By: self Schedule Follow-up Appointment: Yes Clinical Summary of Care: Patient Declined Electronic Signature(s) Signed: 04/11/2021 4:27:57 PM By: Carlene Coria RN Entered By: Carlene Coria on 04/09/2021 14:14:31 Henry Lindsey, Henry P. ([id number]) -------------------------------------------------------------------------------- Lower Extremity Assessment Details Patient Name: Henry Lindsey, Henry P. Date of Service: 04/09/2021 12:45 PM Medical Record Number: [id number] Patient Account Number: [id number] Date of Birth/Sex: Jan 20, 1933 (87 y.o. M) Treating RN: Dolan Amen Primary Care Bradshaw Minihan: Harrel Lemon Other Clinician: Referring Adyan Palau: Leotis Pain Treating Fischer Halley/Extender: Jeri Cos Weeks in Treatment: 0 Edema Assessment Assessed: [Left: Yes] [Right: Yes] Edema:  [Left: Yes] [Right: Yes] Calf Left: Right: Point of Measurement: 31 cm From Medial Instep 40 cm 39.3 cm Ankle Left: Right: Point of Measurement: 9 cm From Medial Instep 25 cm 25.5 cm Knee To Floor Left: Right: From Medial Instep 45 cm 45 cm Vascular Assessment Pulses: Dorsalis Pedis Palpable: [Left:Yes] [Right:Yes] Electronic Signature(s) Signed: 04/09/2021 4:29:35 PM By: Georges Mouse, Minus Breeding RN Entered By: Georges Mouse, Kenia on 04/09/2021 13:23:54 Henry Lindsey, Henry P. ([id number]) -------------------------------------------------------------------------------- Multi Wound Chart Details Patient Name: Henry Lindsey, Henry P. Date of Service: 04/09/2021 12:45 PM Medical Record Number: [id number] Patient Account Number: [id number] Date of Birth/Sex: 02-12-1933 (87 y.o. M) Treating RN: Donnamarie Poag Primary Care Keyuana Wank: Harrel Lemon Other Clinician: Referring Sheana Bir: Leotis Pain Treating Delno Blaisdell/Extender: Skipper Cliche in Treatment: 0 Vital Signs Height(in): 43 Pulse(bpm): 8 Weight(lbs): 195 Blood Pressure(mmHg): 120/55 Body Mass Index(BMI): 27 Temperature(F): 98.2 Respiratory Rate(breaths/min): 18 Photos: [N/A:N/A] Wound Location: Left, Anterior Lower Leg N/A N/A Wounding Event: Gradually Appeared N/A N/A Primary Etiology: Venous Leg Ulcer N/A N/A Comorbid History: Hypertension, Type II Diabetes, N/A N/A Neuropathy Date Acquired: 03/27/2021 N/A N/A Weeks of Treatment: 0 N/A N/A Wound Status: Open N/A N/A Measurements L x W x D (cm) 0.3x1.6x0.1 N/A N/A Area (cm) : 0.377 N/A N/A Volume (cm) : 0.038 N/A N/A Classification: Full Thickness Without Exposed N/A N/A Support Structures Exudate Amount: Medium N/A N/A Exudate Type: Serous N/A N/A Exudate Color: amber N/A N/A Granulation Amount: Medium (34-66%) N/A N/A Granulation Quality: Red N/A N/A Necrotic Amount: Medium (34-66%) N/A N/A Exposed Structures: Fat Layer (Subcutaneous Tissue): N/A N/A Yes Fascia:  No Tendon: No Muscle: No Joint: No Bone: No Epithelialization: None N/A N/A Treatment Notes Electronic Signature(s) Signed: 04/09/2021 3:06:19 PM By: Donnamarie Poag Entered By: Donnamarie Poag on 04/09/2021 13:37:28 Henry Lindsey, Henry P. ([id number]) -------------------------------------------------------------------------------- [place] Details Patient Name: Henry Lindsey, Henry P. Date of Service: 04/09/2021 12:45 PM Medical Record Number: [id number] Patient Account Number: [id number] Date of Birth/Sex: [birth year] (87 y.o. M) Treating RN: Donnamarie Poag Primary Care Harbour Nordmeyer: Harrel Lemon Other Clinician: Referring Krystyne Tewksbury: Leotis Pain Treating Candise Crabtree/Extender: Skipper Cliche in Treatment: 0 Active Inactive Orientation to the Wound Care Program Nursing Diagnoses: Knowledge deficit related to the wound healing center program Goals: Patient/caregiver will verbalize understanding of the [place] Date Initiated: 04/09/2021 Target  Resolution Date: 04/16/2021 Goal Status: Active Interventions: Provide education on orientation to the wound center Notes: Wound/Skin Impairment Nursing Diagnoses: Impaired tissue integrity Knowledge deficit related to smoking impact on wound healing Goals: Ulcer/skin breakdown will have a volume reduction of 30% by week 4 Date Initiated: 04/09/2021 Target Resolution Date: 05/07/2021 Goal Status: Active Ulcer/skin breakdown will have a volume reduction of 50% by week 8 Date Initiated: 04/09/2021 Target Resolution Date: 06/04/2021 Goal Status: Active Interventions: Assess patient/caregiver ability to obtain necessary supplies Assess ulceration(s) every visit Notes: Electronic Signature(s) Signed: 04/09/2021 3:06:19 PM By: Donnamarie Poag Entered By: Donnamarie Poag on 04/09/2021 13:37:11 Cocco, Bhavesh P. ([id number]) -------------------------------------------------------------------------------- Pain Assessment Details Patient Name:  Prete, Fleet P. Date of Service: 04/09/2021 12:45 PM Medical Record Number: [id number] Patient Account Number: [id number] Date of Birth/Sex: [birth year] (87 y.o. M) Treating RN: Dolan Amen Primary Care [place]: Harrel Lemon Other Clinician: Referring Leslieanne Cobarrubias: Leotis Pain Treating Quincey Nored/Extender: Skipper Cliche in Treatment: 0 Active Problems Location of Pain Severity and Description of Pain Patient Has Paino No Site Locations Rate the pain. Current Pain Level: 0 Pain Management and Medication Current Pain Management: Electronic Signature(s) Signed: 04/09/2021 4:29:35 PM By: Georges Mouse, Minus Breeding RN Entered By: Georges Mouse, Kenia on 04/09/2021 13:02:57 Srinivasan, Jacier P. ([id number]) -------------------------------------------------------------------------------- Patient/Caregiver Education Details Patient Name: Dupuy, Worthington P. Date of Service: 04/09/2021 12:45 PM Medical Record Number: [id number] Patient Account Number: [id number] Date of Birth/Gender: 1932/12/05 (87 y.o. M) Treating RN: Donnamarie Poag Primary Care Physician: Harrel Lemon Other Clinician: Referring Physician: Leotis Pain Treating Physician/Extender: Skipper Cliche in Treatment: 0 Education Assessment Education Provided To: Patient Education Topics Provided Basic Hygiene: Methods: Explain/Verbal Responses: State content correctly Welcome To [facility]: Methods: Demonstration, Explain/Verbal Responses: State content correctly Wound/Skin Impairment: Methods: Explain/Verbal Responses: State content correctly Electronic Signature(s) Signed: 04/09/2021 3:06:19 PM By: Donnamarie Poag Entered By: Donnamarie Poag on 04/09/2021 13:38:27 Prest, Cage P. ([id number]) -------------------------------------------------------------------------------- Wound Assessment Details Patient Name: Connell, Bartt P. Date of Service: 04/09/2021 12:45 PM Medical Record Number: [id number] Patient Account Number:  [id number] Date of Birth/Sex: April 04, 1933 (87 y.o. M) Treating RN: Dolan Amen Primary Care Syrai Gladwin: Harrel Lemon Other Clinician: Referring Laureen Frederic: Leotis Pain Treating Lateef Juncaj/Extender: Jeri Cos Weeks in Treatment: 0 Wound Status Wound Number: 1 Primary Etiology: Venous Leg Ulcer Wound Location: Left, Anterior Lower Leg Wound Status: Open Wounding Event: Gradually Appeared Comorbid History: Hypertension, Type II Diabetes, Neuropathy Date Acquired: 03/27/2021 Weeks Of Treatment: 0 Clustered Wound: No Photos Wound Measurements Length: (cm) 0.3 Width: (cm) 1.6 Depth: (cm) 0.1 Area: (cm) 0.377 Volume: (cm) 0.038 % Reduction in Area: % Reduction in Volume: Epithelialization: None Tunneling: No Undermining: No Wound Description Classification: Full Thickness Without Exposed Support Structures Exudate Amount: Medium Exudate Type: Serous Exudate Color: amber Foul Odor After Cleansing: No Slough/Fibrino No Wound Bed Granulation Amount: Medium (34-66%) Exposed Structure Granulation Quality: Red Fascia Exposed: No Necrotic Amount: Medium (34-66%) Fat Layer (Subcutaneous Tissue) Exposed: Yes Necrotic Quality: Adherent Slough Tendon Exposed: No Muscle Exposed: No Joint Exposed: No Bone Exposed: No Treatment Notes Wound #1 (Lower Leg) Wound Laterality: Left, Anterior Cleanser Soap and Water Discharge Instruction: Gently cleanse wound with antibacterial soap, rinse and pat dry prior to dressing wounds Peri-Wound Care Liberati, Kaulin P. ([id number]) Topical Triamcinolone Acetonide Cream, 0.1%, 15 (g) tube Discharge Instruction: Apply as directed by Edinson Domeier. Primary Dressing Xeroform 4x4-HBD (in/in) Discharge Instruction: Apply Xeroform 4x4-HBD (in/in) as directed Secondary Dressing Secured With Kerlix Roll Sterile or Non-Sterile 6-ply 4.5x4 (yd/yd) Discharge Instruction: LIGHTLY SECURED ONLY  OVER WOUND AREA Apply Kerlix as directed Tubigrip Size E, 3.5x10  (in/yds) Discharge Instruction: DOUBLE LAYER Apply 3 Tubigrip E 3-finger-widths below knee to base of toes to secure dressing and/or for swelling. Compression Wrap Compression Stockings Add-Ons Electronic Signature(s) Signed: 04/09/2021 4:29:35 PM By: Georges Mouse, Minus Breeding RN Entered By: Georges Mouse, Kenia on 04/09/2021 13:19:30 Dykman, Beau P. ([id number]) -------------------------------------------------------------------------------- Vitals Details Patient Name: Wix, Aristides P. Date of Service: 04/09/2021 12:45 PM Medical Record Number: [id number] Patient Account Number: [id number] Date of Birth/Sex: 1932-12-31 (87 y.o. M) Treating RN: Dolan Amen Primary Care Sadeel Fiddler: Harrel Lemon Other Clinician: Referring Kollin Udell: Leotis Pain Treating Rily Nickey/Extender: Skipper Cliche in Treatment: 0 Vital Signs Time Taken: 13:03 Temperature (F): 98.2 Height (in): 71 Pulse (bpm): 80 Source: Stated Respiratory Rate (breaths/min): 18 Weight (lbs): 195 Blood Pressure (mmHg): 120/55 Source: Measured Reference Range: 80 - 120 mg / dl Body Mass Index (BMI): 27.2 Electronic Signature(s) Signed: 04/09/2021 4:29:35 PM By: Georges Mouse, Minus Breeding RN Entered By: Georges Mouse, Minus Breeding on 04/09/2021 13:04:38

## 2021-04-16 ENCOUNTER — Other Ambulatory Visit: Payer: Self-pay

## 2021-04-16 ENCOUNTER — Encounter: Payer: Medicare Other | Admitting: Physician Assistant

## 2021-04-16 DIAGNOSIS — Z09 Encounter for follow-up examination after completed treatment for conditions other than malignant neoplasm: Secondary | ICD-10-CM | POA: Diagnosis not present

## 2021-04-16 NOTE — Progress Notes (Addendum)
TAELYN, NEMES ([id number]) Visit Report for 04/16/2021 Chief Complaint Document Details Patient Name: Henry Lindsey, Henry P. Date of Service: 04/16/2021 1:30 PM Medical Record Number: [id number] Patient Account Number: [id number] Date of Birth/Sex: Apr 07, 1933 (87 y.o. M) Treating RN: Donnamarie Poag Primary Care Provider: Harrel Lemon Other Clinician: Referring Provider: Harrel Lemon Treating Provider/Extender: Skipper Cliche in Treatment: 1 Information Obtained from: Patient Chief Complaint Left leg ulcer Electronic Signature(s) Signed: 04/16/2021 1:16:33 PM By: Worthy Keeler PA-C Entered By: Worthy Keeler on 04/16/2021 13:16:33 Henry Lindsey, Henry P. ([id number]) -------------------------------------------------------------------------------- HPI Details Patient Name: Henry Lindsey, Henry P. Date of Service: 04/16/2021 1:30 PM Medical Record Number: [id number] Patient Account Number: [id number] Date of Birth/Sex: [birth year] (87 y.o. M) Treating RN: Donnamarie Poag Primary Care Provider: Harrel Lemon Other Clinician: Referring Provider: Harrel Lemon Treating Provider/Extender: Skipper Cliche in Treatment: 1 History of Present Illness HPI Description: 04/09/2021 upon evaluation today patient presents for initial evaluation here in our clinic concerning issues he has been having with a wound on his left leg. Both legs have actually been giving him trouble. He does have an ABI which was performed previously at vascular. This showed that he had a right ABI 1.02 with a TBI of 0.44 and a left ABI of 0.97 with a TBI of 0.51. With this being said he may have some notations with regard to his blood flow but nothing so significant that I think he could not heal. I also think he could Tolerate compression. With that being said the big thing they are just making sure that we can get him to the point where he is not weeping or leaking so that he can wear the compression without complication. He has a lot of dry  flaky skin noted where obviously there is been some issues going on here. The patient does have a history of chronic venous insufficiency, lymphedema, and diabetes mellitus type 2. 04/16/2021 upon evaluation today patient appears to be doing excellent in fact he appears to be completely healed based on what I see today. There does not appear to be any evidence of active infection which is great news and overall very pleased with where things stand today. No fevers, chills, nausea, vomiting, or diarrhea. Electronic Signature(s) Signed: 04/16/2021 1:44:07 PM By: Worthy Keeler PA-C Entered By: Worthy Keeler on 04/16/2021 13:44:07 Henry Lindsey, Henry P. ([id number]) -------------------------------------------------------------------------------- Physical Exam Details Patient Name: [name], [name] Date of Service: 04/16/2021 1:30 PM Medical Record Number: [id number] Patient Account Number: [id number] Date of Birth/Sex: 12-30-1932 (87 y.o. M) Treating RN: Donnamarie Poag Primary Care Provider: Harrel Lemon Other Clinician: Referring Provider: Harrel Lemon Treating Provider/Extender: Jeri Cos Weeks in Treatment: 1 Constitutional Well-nourished and well-hydrated in no acute distress. Respiratory normal breathing without difficulty. Psychiatric this patient is able to make decisions and demonstrates good insight into disease process. Alert and Oriented x 3. pleasant and cooperative. Notes Upon inspection patient's wound bed actually showed signs of being completely healed which is great news his leg also seems to be doing better has been using the urea cream which does seem to have done well for him. Overall I am extremely pleased with where things stand today. He does have compression socks at home. Electronic Signature(s) Signed: 04/16/2021 1:44:25 PM By: Worthy Keeler PA-C Entered By: Worthy Keeler on 04/16/2021 13:44:25 [name], [name]  ([id number]) -------------------------------------------------------------------------------- Physician Orders Details Patient Name: Henry Lindsey, Henry P. Date of Service: 04/16/2021 1:30 PM Medical Record Number: [id number] Patient Account Number: [id number] Date of Birth/Sex:  October 08, 1933 (87 y.o. M) Treating RN: Donnamarie Poag Primary Care Provider: Harrel Lemon Other Clinician: Referring Provider: Harrel Lemon Treating Provider/Extender: Skipper Cliche in Treatment: 1 Verbal / Phone Orders: No Diagnosis Coding ICD-10 Coding Code Description I87.2 Venous insufficiency (chronic) (peripheral) I89.0 Lymphedema, not elsewhere classified L97.822 Non-pressure chronic ulcer of other part of left lower leg with fat layer exposed E11.622 Type 2 diabetes mellitus with other skin ulcer Discharge From [facility] Services o Discharge from [place] Treatment Complete - wound healed Edema Control - Lymphedema / Segmental Compressive Device / Other o Patient to wear own compression stockings. Remove compression stockings every night before going to bed and put on every morning when getting up. Notes Continue Urea cream until flaky skin removed, then switch to EUCERIN for dry skin or comparable lotion. Electronic Signature(s) Signed: 04/16/2021 4:32:34 PM By: Donnamarie Poag Signed: 04/16/2021 5:58:21 PM By: Worthy Keeler PA-C Previous Signature: 04/16/2021 4:19:43 PM Version By: Donnamarie Poag Entered By: Donnamarie Poag on 04/16/2021 16:31:38 Henry Lindsey, Henry P. ([id number]) -------------------------------------------------------------------------------- Problem List Details Patient Name: Henry Lindsey, Henry P. Date of Service: 04/16/2021 1:30 PM Medical Record Number: [id number] Patient Account Number: [id number] Date of Birth/Sex: [birth year] (87 y.o. M) Treating RN: Donnamarie Poag Primary Care Provider: Harrel Lemon Other Clinician: Referring Provider: Harrel Lemon Treating Provider/Extender: Skipper Cliche  in Treatment: 1 Active Problems ICD-10 Encounter Code Description Active Date MDM Diagnosis I87.2 Venous insufficiency (chronic) (peripheral) 04/09/2021 No Yes I89.0 Lymphedema, not elsewhere classified 04/09/2021 No Yes L97.822 Non-pressure chronic ulcer of other part of left lower leg with fat layer 04/09/2021 No Yes exposed E11.622 Type 2 diabetes mellitus with other skin ulcer 04/09/2021 No Yes Inactive Problems Resolved Problems Electronic Signature(s) Signed: 04/16/2021 1:16:28 PM By: Worthy Keeler PA-C Entered By: Worthy Keeler on 04/16/2021 13:16:28 Henry Lindsey, Henry P. ([id number]) -------------------------------------------------------------------------------- Progress Note Details Patient Name: Henry Lindsey, Henry P. Date of Service: 04/16/2021 1:30 PM Medical Record Number: [id number] Patient Account Number: [id number] Date of Birth/Sex: 1933-04-25 (87 y.o. M) Treating RN: Donnamarie Poag Primary Care Provider: Harrel Lemon Other Clinician: Referring Provider: Harrel Lemon Treating Provider/Extender: Skipper Cliche in Treatment: 1 Subjective Chief Complaint Information obtained from Patient Left leg ulcer History of Present Illness (HPI) 04/09/2021 upon evaluation today patient presents for initial evaluation here in our clinic concerning issues he has been having with a wound on his left leg. Both legs have actually been giving him trouble. He does have an ABI which was performed previously at vascular. This showed that he had a right ABI 1.02 with a TBI of 0.44 and a left ABI of 0.97 with a TBI of 0.51. With this being said he may have some notations with regard to his blood flow but nothing so significant that I think he could not heal. I also think he could Tolerate compression. With that being said the big thing they are just making sure that we can get him to the point where he is not weeping or leaking so that he can wear the compression without complication. He has a lot of  dry flaky skin noted where obviously there is been some issues going on here. The patient does have a history of chronic venous insufficiency, lymphedema, and diabetes mellitus type 2. 04/16/2021 upon evaluation today patient appears to be doing excellent in fact he appears to be completely healed based on what I see today. There does not appear to be any evidence of active infection which is great news and overall very pleased  with where things stand today. No fevers, chills, nausea, vomiting, or diarrhea. Objective Constitutional Well-nourished and well-hydrated in no acute distress. Vitals Time Taken: 1:08 PM, Height: 71 in, Weight: 195 lbs, BMI: 27.2, Temperature: 98.0 F, Pulse: 72 bpm, Respiratory Rate: 18 breaths/min, Blood Pressure: 112/51 mmHg. Respiratory normal breathing without difficulty. Psychiatric this patient is able to make decisions and demonstrates good insight into disease process. Alert and Oriented x 3. pleasant and cooperative. General Notes: Upon inspection patient's wound bed actually showed signs of being completely healed which is great news his leg also seems to be doing better has been using the urea cream which does seem to have done well for him. Overall I am extremely pleased with where things stand today. He does have compression socks at home. Integumentary (Hair, Skin) Wound #1 status is Healed - Epithelialized. Original cause of wound was Gradually Appeared. The date acquired was: 03/27/2021. The wound has been in treatment 1 weeks. The wound is located on the Left,Anterior Lower Leg. The wound measures 0cm length x 0cm width x 0cm depth; 0cm^2 area and 0cm^3 volume. There is no tunneling or undermining noted. There is a medium amount of serous drainage noted. There is no granulation within the wound bed. There is no necrotic tissue within the wound bed. Assessment Active Problems ICD-10 Venous insufficiency (chronic) (peripheral) Lymphedema, not elsewhere  classified Non-pressure chronic ulcer of other part of left lower leg with fat layer exposed Henry Lindsey, Henry P. ([id number]) Type 2 diabetes mellitus with other skin ulcer Plan Discharge From [facility] Services: Discharge from [place] Treatment Complete - wound healed Edema Control - Lymphedema / Segmental Compressive Device / Other: Patient to wear own compression stockings. Remove compression stockings every night before going to bed and put on every morning when getting up. General Notes: Continue Urea cream until flaky skin removed, then switch to EUCERIN for dry skin or comparable lotion. WOUND #1: - Lower Leg Wound Laterality: Left, Anterior Cleanser: Soap and Water 1 x Per Week/30 Days Discharge Instructions: Gently cleanse wound with antibacterial soap, rinse and pat dry prior to dressing wounds Secured With: Tubigrip Size E, 3.5x10 (in/yds) 1 x Per Week/30 Days Discharge Instructions: DOUBLE LAYER Apply 3 Tubigrip E 3-finger-widths below knee to base of toes to secure dressing and/or for swelling. 1. Would recommend currently that we going continue with the wound care measures as before and the patient is in agreement with that plan. This includes the use of the urea cream. That seems to be doing well for him currently. 2. With regard to once the dry flaky skin is cleared away which looks pretty good at this point he can switch over to Eucerin + ongoing treatment. 3. With regard to the wound this does appear to be extremely well-healed at this point and I am very pleased in that regard. We will see the patient back for follow-up visit as needed at this point. Electronic Signature(s) Signed: 04/16/2021 1:45:18 PM By: Worthy Keeler PA-C Entered By: Worthy Keeler on 04/16/2021 13:45:18 Henry Lindsey, Henry P. ([id number]) -------------------------------------------------------------------------------- SuperBill Details Patient Name: Henry Lindsey, Henry P. Date of Service: 04/16/2021 Medical  Record Number: [id number] Patient Account Number: [id number] Date of Birth/Sex: 12/02/1932 (87 y.o. M) Treating RN: Donnamarie Poag Primary Care Provider: Harrel Lemon Other Clinician: Referring Provider: Harrel Lemon Treating Provider/Extender: Skipper Cliche in Treatment: 1 Diagnosis Coding ICD-10 Codes Code Description I87.2 Venous insufficiency (chronic) (peripheral) I89.0 Lymphedema, not elsewhere classified L97.822 Non-pressure chronic ulcer of other part of  left lower leg with fat layer exposed E11.622 Type 2 diabetes mellitus with other skin ulcer Facility Procedures CPT4 Code: [id number] Description: [phone] - WOUND CARE VISIT-LEV 2 EST PT Modifier: Quantity: 1 Physician Procedures CPT4 Code: 8088110 Description: 31594 - WC PHYS LEVEL 3 - EST PT Modifier: Quantity: 1 CPT4 Code: Description: ICD-10 Diagnosis Description I87.2 Venous insufficiency (chronic) (peripheral) I89.0 Lymphedema, not elsewhere classified L97.822 Non-pressure chronic ulcer of other part of left lower leg with fat lay E11.622 Type 2 diabetes mellitus with  other skin ulcer Modifier: er exposed Quantity: Electronic Signature(s) Signed: 04/16/2021 1:45:32 PM By: Worthy Keeler PA-C Entered By: Worthy Keeler on 04/16/2021 13:45:31

## 2021-04-17 NOTE — Progress Notes (Signed)
KHY, PITRE ([id number]) Visit Report for 04/16/2021 Arrival Information Details Patient Name: Henry Lindsey, Henry Lindsey. Date of Service: 04/16/2021 1:30 PM Medical Record Number: [id number] Patient Account Number: [id number] Date of Birth/Sex: Dec 01, 1932 (87 y.o. M) Treating RN: Donnamarie Poag Primary Care Tauni Sanks: Harrel Lemon Other Clinician: Referring Laryn Venning: Harrel Lemon Treating Wavie Hashimi/Extender: Skipper Cliche in Treatment: 1 Visit Information History Since Last Visit Added or deleted any medications: No Patient Arrived: Ambulatory Had a fall or experienced change in No Arrival Time: 13:06 activities of daily living that may affect Accompanied By: self risk of falls: Transfer Assistance: None Hospitalized since last visit: No Patient Identification Verified: Yes Has Dressing in Place as Prescribed: Yes Secondary Verification Process Completed: Yes Pain Present Now: No Patient Has Alerts: Yes Patient Alerts: Type II Diabetic R. ABI 1.02 L. ABI 0.97 Electronic Signature(s) Signed: 04/16/2021 4:19:43 PM By: Donnamarie Poag Entered By: Donnamarie Poag on 04/16/2021 13:07:05 Lancon, Meir P. ([id number]) -------------------------------------------------------------------------------- Clinic Level of Care Assessment Details Patient Name: Freels, Henry P. Date of Service: 04/16/2021 1:30 PM Medical Record Number: [id number] Patient Account Number: [id number] Date of Birth/Sex: [birth year] (87 y.o. M) Treating RN: Donnamarie Poag Primary Care Davison Ohms: Harrel Lemon Other Clinician: Referring Marisa Hufstetler: Harrel Lemon Treating Aveline Daus/Extender: Skipper Cliche in Treatment: 1 Clinic Level of Care Assessment Items TOOL 4 Quantity Score []  - Use when only an EandM is performed on FOLLOW-UP visit 0 ASSESSMENTS - Nursing Assessment / Reassessment []  - Reassessment of Co-morbidities (includes updates in patient status) 0 []  - 0 Reassessment of Adherence to Treatment Plan ASSESSMENTS - Wound  and Skin Assessment / Reassessment X - Simple Wound Assessment / Reassessment - one wound 1 5 []  - 0 Complex Wound Assessment / Reassessment - multiple wounds []  - 0 Dermatologic / Skin Assessment (not related to wound area) ASSESSMENTS - Focused Assessment []  - Circumferential Edema Measurements - multi extremities 0 []  - 0 Nutritional Assessment / Counseling / Intervention []  - 0 Lower Extremity Assessment (monofilament, tuning fork, pulses) []  - 0 Peripheral Arterial Disease Assessment (using hand held doppler) ASSESSMENTS - Ostomy and/or Continence Assessment and Care []  - Incontinence Assessment and Management 0 []  - 0 Ostomy Care Assessment and Management (repouching, etc.) PROCESS - Coordination of Care X - Simple Patient / Family Education for ongoing care 1 15 []  - 0 Complex (extensive) Patient / Family Education for ongoing care []  - 0 Staff obtains Programmer, systems, Records, Test Results / Process Orders []  - 0 Staff telephones HHA, Nursing Homes / Clarify orders / etc []  - 0 Routine Transfer to another Facility (non-emergent condition) []  - 0 Routine Hospital Admission (non-emergent condition) []  - 0 New Admissions / Biomedical engineer / Ordering NPWT, Apligraf, etc. []  - 0 Emergency Hospital Admission (emergent condition) X- 1 10 Simple Discharge Coordination []  - 0 Complex (extensive) Discharge Coordination PROCESS - Special Needs []  - Pediatric / Minor Patient Management 0 []  - 0 Isolation Patient Management []  - 0 Hearing / Language / Visual special needs []  - 0 Assessment of Community assistance (transportation, D/C planning, etc.) []  - 0 Additional assistance / Altered mentation []  - 0 Support Surface(s) Assessment (bed, cushion, seat, etc.) INTERVENTIONS - Wound Cleansing / Measurement Kiener, Henry P. ([id number]) X- 1 5 Simple Wound Cleansing - one wound []  - 0 Complex Wound Cleansing - multiple wounds []  - 0 Wound Imaging (photographs - any  number of wounds) []  - 0 Wound Tracing (instead of photographs) X- 1 5 Simple Wound Measurement - one wound []  -  0 Complex Wound Measurement - multiple wounds INTERVENTIONS - Wound Dressings X - Small Wound Dressing one or multiple wounds 1 10 []  - 0 Medium Wound Dressing one or multiple wounds []  - 0 Large Wound Dressing one or multiple wounds []  - 0 Application of Medications - topical []  - 0 Application of Medications - injection INTERVENTIONS - Miscellaneous []  - External ear exam 0 []  - 0 Specimen Collection (cultures, biopsies, blood, body fluids, etc.) []  - 0 Specimen(s) / Culture(s) sent or taken to Lab for analysis []  - 0 Patient Transfer (multiple staff / Civil Service fast streamer / Similar devices) []  - 0 Simple Staple / Suture removal (25 or less) []  - 0 Complex Staple / Suture removal (26 or more) []  - 0 Hypo / Hyperglycemic Management (close monitor of Blood Glucose) []  - 0 Ankle / Brachial Index (ABI) - do not check if billed separately X- 1 5 Vital Signs Has the patient been seen at the hospital within the last three years: Yes Total Score: 55 Level Of Care: New/Established - Level 2 Electronic Signature(s) Signed: 04/16/2021 4:19:43 PM By: Donnamarie Poag Entered By: Donnamarie Poag on 04/16/2021 13:22:22 Pendley, Henry P. ([id number]) -------------------------------------------------------------------------------- Encounter Discharge Information Details Patient Name: Endres, Henry P. Date of Service: 04/16/2021 1:30 PM Medical Record Number: [id number] Patient Account Number: [id number] Date of Birth/Sex: 15-May-1933 (87 y.o. M) Treating RN: Carlene Coria Primary Care Maximilien Hayashi: Harrel Lemon Other Clinician: Referring Lucrecia Mcphearson: Harrel Lemon Treating Jaquille Kau/Extender: Skipper Cliche in Treatment: 1 Encounter Discharge Information Items Discharge Condition: Stable Ambulatory Status: Ambulatory Discharge Destination: Home Transportation: Private Auto Accompanied By:  self Schedule Follow-up Appointment: Yes Clinical Summary of Care: Patient Declined Electronic Signature(s) Signed: 04/17/2021 8:03:47 AM By: Carlene Coria RN Entered By: Carlene Coria on 04/16/2021 13:30:55 Vacha, Henry P. ([id number]) -------------------------------------------------------------------------------- Lower Extremity Assessment Details Patient Name: Corle, Crosley P. Date of Service: 04/16/2021 1:30 PM Medical Record Number: [id number] Patient Account Number: [id number] Date of Birth/Sex: [name] 17, 1933 (87 y.o. M) Treating RN: Donnamarie Poag Primary Care Mattias Walmsley: Harrel Lemon Other Clinician: Referring Xanthe Couillard: Harrel Lemon Treating Zaydn Gutridge/Extender: Jeri Cos Weeks in Treatment: 1 Edema Assessment Assessed: [Left: Yes] [Right: No] [Left: Edema] [Right: :] Calf Left: Right: Point of Measurement: 31 cm From Medial Instep 40 cm Ankle Left: Right: Point of Measurement: 9 cm From Medial Instep 24 cm Vascular Assessment Pulses: Dorsalis Pedis Palpable: [Left:Yes] Electronic Signature(s) Signed: 04/16/2021 4:19:43 PM By: Donnamarie Poag Entered By: Donnamarie Poag on 04/16/2021 13:12:43 Klooster, Henry P. ([id number]) -------------------------------------------------------------------------------- Multi Wound Chart Details Patient Name: Roszak, Henry P. Date of Service: 04/16/2021 1:30 PM Medical Record Number: [id number] Patient Account Number: [id number] Date of Birth/Sex: Nov 19, 1932 (87 y.o. M) Treating RN: Donnamarie Poag Primary Care Honora Searson: Harrel Lemon Other Clinician: Referring Eames Dibiasio: Harrel Lemon Treating Johnathin Vanderschaaf/Extender: Skipper Cliche in Treatment: 1 Vital Signs Height(in): 71 Pulse(bpm): 78 Weight(lbs): 195 Blood Pressure(mmHg): 112/51 Body Mass Index(BMI): 27 Temperature(F): 98.0 Respiratory Rate(breaths/min): 18 Photos: [N/A:N/A] Wound Location: Left, Anterior Lower Leg N/A N/A Wounding Event: Gradually Appeared N/A N/A Primary Etiology: Venous Leg  Ulcer N/A N/A Comorbid History: Hypertension, Type II Diabetes, N/A N/A Neuropathy Date Acquired: 03/27/2021 N/A N/A Weeks of Treatment: 1 N/A N/A Wound Status: Open N/A N/A Measurements L x W x D (cm) 0.1x0.1x0.1 N/A N/A Area (cm) : 0.008 N/A N/A Volume (cm) : 0.001 N/A N/A % Reduction in Area: 97.90% N/A N/A % Reduction in Volume: 97.40% N/A N/A Classification: Full Thickness Without Exposed N/A N/A Support Structures Exudate Amount: Medium N/A N/A Exudate Type:  Serous N/A N/A Exudate Color: amber N/A N/A Granulation Amount: None Present (0%) N/A N/A Necrotic Amount: None Present (0%) N/A N/A Exposed Structures: Fascia: No N/A N/A Fat Layer (Subcutaneous Tissue): No Tendon: No Muscle: No Joint: No Bone: No Epithelialization: None N/A N/A Treatment Notes Electronic Signature(s) Signed: 04/16/2021 4:19:43 PM By: Donnamarie Poag Entered By: Donnamarie Poag on 04/16/2021 13:18:13 Rosasco, Henry P. ([id number]) -------------------------------------------------------------------------------- [place] Details Patient Name: Dillen, Henry P. Date of Service: 04/16/2021 1:30 PM Medical Record Number: [id number] Patient Account Number: [id number] Date of Birth/Sex: 1933/06/23 (87 y.o. M) Treating RN: Donnamarie Poag Primary Care Yecheskel Kurek: Harrel Lemon Other Clinician: Referring Kazuki Ingle: Harrel Lemon Treating Dejanee Thibeaux/Extender: Jeri Cos Weeks in Treatment: 1 Active Inactive Electronic Signature(s) Signed: 04/16/2021 2:02:33 PM By: Donnamarie Poag Entered By: Donnamarie Poag on 04/16/2021 14:02:33 Shepard, Henry P. ([id number]) -------------------------------------------------------------------------------- Pain Assessment Details Patient Name: Ferrera, Henry P. Date of Service: 04/16/2021 1:30 PM Medical Record Number: [id number] Patient Account Number: [id number] Date of Birth/Sex: [birth year] (87 y.o. M) Treating RN: Donnamarie Poag Primary Care Blake Goya: Harrel Lemon Other  Clinician: Referring Kailan Carmen: Harrel Lemon Treating Caylei Sperry/Extender: Skipper Cliche in Treatment: 1 Active Problems Location of Pain Severity and Description of Pain Patient Has Paino No Site Locations Rate the pain. Current Pain Level: 0 Pain Management and Medication Current Pain Management: Electronic Signature(s) Signed: 04/16/2021 4:19:43 PM By: Donnamarie Poag Entered By: Donnamarie Poag on 04/16/2021 13:09:28 Chumley, Henry P. ([id number]) -------------------------------------------------------------------------------- Patient/Caregiver Education Details Patient Name: Sennett, Henry P. Date of Service: 04/16/2021 1:30 PM Medical Record Number: [id number] Patient Account Number: [id number] Date of Birth/Gender: 1933-05-05 (87 y.o. M) Treating RN: Donnamarie Poag Primary Care Physician: Harrel Lemon Other Clinician: Referring Physician: Harrel Lemon Treating Physician/Extender: Skipper Cliche in Treatment: 1 Education Assessment Education Provided To: Patient Education Topics Provided Basic Hygiene: Methods: Explain/Verbal Responses: State content correctly Wound/Skin Impairment: Electronic Signature(s) Signed: 04/16/2021 4:19:43 PM By: Donnamarie Poag Entered By: Donnamarie Poag on 04/16/2021 13:22:38 Keeter, Henry P. ([id number]) -------------------------------------------------------------------------------- Wound Assessment Details Patient Name: Mountjoy, Henry P. Date of Service: 04/16/2021 1:30 PM Medical Record Number: [id number] Patient Account Number: [id number] Date of Birth/Sex: [birth year] (87 y.o. M) Treating RN: Donnamarie Poag Primary Care Evelyne Makepeace: Harrel Lemon Other Clinician: Referring Dayanne Yiu: Harrel Lemon Treating Seon Gaertner/Extender: Jeri Cos Weeks in Treatment: 1 Wound Status Wound Number: 1 Primary Etiology: Venous Leg Ulcer Wound Location: Left, Anterior Lower Leg Wound Status: Healed - Epithelialized Wounding Event: Gradually Appeared Comorbid History:  Hypertension, Type II Diabetes, Neuropathy Date Acquired: 03/27/2021 Weeks Of Treatment: 1 Clustered Wound: No Photos Wound Measurements Length: (cm) 0 Width: (cm) 0 Depth: (cm) 0 Area: (cm) Volume: (cm) % Reduction in Area: 100% % Reduction in Volume: 100% Epithelialization: None 0 Tunneling: No 0 Undermining: No Wound Description Classification: Full Thickness Without Exposed Support Structures Exudate Amount: Medium Exudate Type: Serous Exudate Color: amber Foul Odor After Cleansing: No Slough/Fibrino No Wound Bed Granulation Amount: None Present (0%) Exposed Structure Necrotic Amount: None Present (0%) Fascia Exposed: No Fat Layer (Subcutaneous Tissue) Exposed: No Tendon Exposed: No Muscle Exposed: No Joint Exposed: No Bone Exposed: No Treatment Notes Wound #1 (Lower Leg) Wound Laterality: Left, Anterior Cleanser Peri-Wound Care Topical Primary Dressing Henry Lindsey, Henry Lindsey ([id number]) Secondary Dressing Secured With Compression Wrap Compression Stockings Add-Ons Electronic Signature(s) Signed: 04/16/2021 4:19:43 PM By: Donnamarie Poag Entered By: Donnamarie Poag on 04/16/2021 13:25:17 Takayama, Henry P. ([id number]) -------------------------------------------------------------------------------- Vitals Details Patient Name: Stangl, Henry P. Date of Service: 04/16/2021 1:30 PM Medical Record Number: [id number] Patient Account Number: [id number]  Date of Birth/Sex: [birth year] (87 y.o. M) Treating RN: Donnamarie Poag Primary Care Jeannene Tschetter: Harrel Lemon Other Clinician: Referring Shawnmichael Parenteau: Harrel Lemon Treating Darlynn Ricco/Extender: Skipper Cliche in Treatment: 1 Vital Signs Time Taken: 13:08 Temperature (F): 98.0 Height (in): 71 Pulse (bpm): 72 Weight (lbs): 195 Respiratory Rate (breaths/min): 18 Body Mass Index (BMI): 27.2 Blood Pressure (mmHg): 112/51 Reference Range: 80 - 120 mg / dl Electronic Signature(s) Signed: 04/16/2021 4:19:43 PM By: Donnamarie Poag Entered  ByDonnamarie Poag on 04/16/2021 13:09:21

## 2021-04-18 ENCOUNTER — Encounter (INDEPENDENT_AMBULATORY_CARE_PROVIDER_SITE_OTHER): Payer: Medicare Other

## 2021-04-25 ENCOUNTER — Ambulatory Visit (INDEPENDENT_AMBULATORY_CARE_PROVIDER_SITE_OTHER): Payer: Medicare Other | Admitting: Nurse Practitioner

## 2021-05-02 NOTE — Progress Notes (Signed)
Cardiology Office Note:    Date:  05/06/2021   ID:  Henry Lindsey, DOB [birth year], MRN [id number]  PCP:  Baxter Hire, MD  [facility] Cardiologist:  Ida Rogue, MD  [facility] Electrophysiologist:  None   Referring MD: Baxter Hire, MD   Chief Complaint: 3 month follow-up  History of Present Illness:    Henry Lindsey is a 87 y.o. male with a hx of HLD, arotic atherosclerosis, 3V CAD, prior tobacco use who presents after echo.   CT scan December 2017 showed moderate diffuse aortic atherosclerosis as well as moderate diffuse 3V coronary disease. He was seen by Dr. Rockey Situ April 2021 for preoperative clearance for knee surgery and deemed acceptable risk.   Seen 06/2020 for b/l upper extremity numbness for which he was working with PT and ortho. He had no anginal symptoms.   Seen by PCP 10/03/20 and diagnosed with bronchitis. CXR showed PNA and bilateral pleural effusions. He was eventiually started on lasix. Echo showed 11/14/20 at [facility] clinic showed LVEF 35%, normal RVSP, moderate MR, mild TR, mild pericardial effusion.   Last seen 11/17/20 and reported leg weakness and no anginal symptoms. He was set up for [facility]. Cath showed severe 3V disease, EF 35%. RHC showed mildly elevated pressures, elevated EDO. He was recommended CT consult for CABG. He was seen by Dr. Cyndia Bent who recommended medical management.   Last seen 01/31/21 and reported he was taking torsemide QOD.  He saw his PCP and torsemide was changed back to lasix. He was started on Jardiance.   Saw 5/27 and reported a rash on torsemide so he was switched back to lasix.   6/7 patient saw vascular reporting redness, swelling and edema. He had ABIs done today which were 1.02 on the right and 0.96 on the left with digital pressures of 52 on the right and 61 on the left which were mildly reduced from studies last year.  Today, the patient reports red weeping legs with lower leg edema. The patient said he was going  to the wound clinic for a couple weeks for the same thing. He said the wound center recently cleared him. Overnight the patient reported his legs became red and weepy. He denies fever and chills. He reports he has been taking lasix 20 mg for the last 2 weeks ago. He was using compression socks as well. No chest pain. He has chronic shortness of breath. Feels an extra lasix helps with breathing. No nausea, abd pain, vomiting. In review he had put on Doxycycline 1109m BID for 7 days for lower leg cellulitis before by PCP.  Past Medical History:  Diagnosis Date   Arthritis    BPH (benign prostatic hyperplasia)    Carotid artery stenosis 11/2010   <50% bilaterally   COPD (chronic obstructive pulmonary disease) (HCC)    noted CT 10/05/16 former smoker quit age 87   CVA (cerebral infarction) 11/2010   r thalamic lacunar   Diabetes mellitus    GERD (gastroesophageal reflux disease)    Hyperlipidemia    Hypertension    [place]    Peripheral vascular disease in diabetes mellitus ([place] 06/2012    95% occlusion s/p PTCA R SFA [place] Sept 2013   Stroke ([facility]     Past Surgical History:  Procedure Laterality Date   CHOLECYSTECTOMY N/A 11/13/2016   Procedure: LAPAROSCOPIC CHOLECYSTECTOMY WITH INTRAOPERATIVE CHOLANGIOGRAM;  Surgeon: JOlean Ree MD;  Location: [facility] ORS;  Service: General;  Laterality: N/A;  COLONOSCOPY WITH PROPOFOL N/A 02/10/2018   Procedure: COLONOSCOPY WITH PROPOFOL;  Surgeon: Lucilla Lame, MD;  Location[facility] ENDOSCOPY;  Service: Endoscopy;  Laterality: N/A;   ERCP N/A 11/17/2016   Procedure: ENDOSCOPIC RETROGRADE CHOLANGIOPANCREATOGRAPHY (ERCP);  Surgeon: Irene Shipper, MD;  Location: [facility] ENDOSCOPY;  Service: Endoscopy;  Laterality: N/A;   ERCP N/A 02/04/2017   Procedure: ENDOSCOPIC RETROGRADE CHOLANGIOPANCREATOGRAPHY (ERCP) Stent removal;  Surgeon: Lucilla Lame, MD;  Location: [facility] ENDOSCOPY;  Service: Endoscopy;  Laterality: N/A;   IR GENERIC HISTORICAL   10/06/2016   IR PERC CHOLECYSTOSTOMY 10/06/2016 Aletta Edouard, MD [facility]-INTER[facility] RAD   JOINT REPLACEMENT Left 2014   left knee   RIGHT/LEFT HEART CATH AND CORONARY ANGIOGRAPHY N/A 11/24/2020   Procedure: RIGHT/LEFT HEART CATH AND CORONARY ANGIOGRAPHY;  Surgeon: Minna Merritts, MD;  Location: [place] CV LAB;  Service: Cardiovascular;  Laterality: N/A;   TOTAL KNEE ARTHROPLASTY Right 08/08/2020   Procedure: TOTAL KNEE ARTHROPLASTY;  Surgeon: Lovell Sheehan, MD;  Location: [facility] ORS;  Service: Orthopedics;  Laterality: Right;   UPPER GASTROINTESTINAL ENDOSCOPY     WRIST SURGERY      Current Medications: Current Meds  Medication Sig   albuterol (VENTOLIN HFA) 108 (90 Base) MCG/ACT inhaler Inhale 2 puffs into the lungs every 6 (six) hours as needed.   ascorbic acid (VITAMIN C) 500 MG tablet Take 500 mg by mouth daily.    aspirin EC 81 MG tablet Take 1 tablet (81 mg total) by mouth daily. Swallow whole.   atorvastatin (LIPITOR) 40 MG tablet Take 1 tablet (40 mg total) by mouth daily.   Blood Glucose Monitoring Suppl (ONE TOUCH ULTRA SYSTEM KIT) w/Device KIT 1 kit by Does not apply route once. Use DX code E11.59   cholecalciferol (VITAMIN D) 25 MCG (1000 UNIT) tablet Take 1,000 Units by mouth daily.   cholestyramine (QUESTRAN) 4 g packet Take 4 g by mouth 3 (three) times daily with meals.   cilostazol (PLETAL) 100 MG tablet Take 100 mg by mouth 2 (two) times daily.   diclofenac Sodium (VOLTAREN) 1 % GEL Apply 2 g topically 2 (two) times daily as needed (pain).   doxycycline (VIBRA-TABS) 100 MG tablet Take 1 tablet (100 mg) by mouth twice daily x 7 days   furosemide (LASIX) 40 MG tablet Take 0.5 tablet (20 mg) by mouth once daily    gabapentin (NEURONTIN) 400 MG capsule Take 400 mg by mouth 2 (two) times daily.   glipiZIDE (GLUCOTROL) 10 MG tablet Take 10 mg by mouth 2 (two) times daily before a meal.   HYDROcodone bit-homatropine (HYCODAN) 5-1.5 MG/5ML syrup Hydromet 5 mg-1.5 mg/5 mL oral  syrup  TK 5 ML PO QHS PRN COU   HYDROcodone-acetaminophen (NORCO/VICODIN) 5-325 MG tablet Take 1 tablet by mouth every 6 (six) hours as needed.   INS SYRINGE/NEEDLE 1CC/28G (B-D INSULIN SYRINGE 1CC/28G) 28G X 1/2" 1 ML MISC USE TO ADMINISTER INSULIN DAILY   insulin NPH-regular Human (70-30) 100 UNIT/ML injection Inject 23 Units into the skin in the morning and at bedtime.    JARDIANCE 25 MG TABS tablet Take 25 mg by mouth daily.   latanoprost (XALATAN) 0.005 % ophthalmic solution Place 2 drops into both eyes at bedtime.    methylPREDNISolone (MEDROL DOSEPAK) 4 MG TBPK tablet See admin instructions. follow package directions   metoprolol succinate (TOPROL-XL) 100 MG 24 hr tablet Take 1 tablet (100 mg total) by mouth daily. Take with or immediately following a meal.   Multiple Vitamin (MULTIVITAMIN WITH MINERALS)  TABS tablet Take 1 tablet by mouth daily.   nitroGLYCERIN (NITROSTAT) 0.4 MG SL tablet Place 1 tablet (0.4 mg total) under the tongue every 5 (five) minutes as needed for chest pain. MAXIMUM 3 TABLETS   ONE TOUCH ULTRA TEST test strip USE 1 STRIP TO TEST THREE TIMES DAILY AS DIRECTED   ONETOUCH DELICA LANCETS 07M MISC Use three times daily to check blood sugar.   pantoprazole (PROTONIX) 40 MG tablet Take 40 mg by mouth daily.   potassium chloride (KLOR-CON) 10 MEQ tablet Take 1 tablet (10 mEq total) by mouth daily.   silver sulfADIAZINE (SILVADENE) 1 % cream Apply topically.   SSD, SILVER SULFADIAZINE, EX Apply 50 g topically as needed.   tamsulosin (FLOMAX) 0.4 MG CAPS capsule Take 0.4 mg by mouth daily after breakfast.   timolol (TIMOPTIC) 0.5 % ophthalmic solution Place 2 drops into both eyes daily.    torsemide (DEMADEX) 20 MG tablet Take 20 mg by mouth every other day.   traZODone (DESYREL) 50 MG tablet trazodone 50 mg tablet   vitamin B-12 (CYANOCOBALAMIN) 500 MCG tablet Take 500 mcg by mouth daily.   vitamin E 180 MG (400 UNITS) capsule Take 400 Units by mouth daily.      Allergies:   Metformin and related   Social History   Socioeconomic History   Marital status: Widowed    Spouse name: Enid Derry, deceased   Number of children: 2   Years of education: 53   Highest education level: Not on file  Occupational History    Employer: retired  Tobacco Use   Smoking status: Former    Packs/day: 0.50    Years: 33.00    Pack years: 16.50    Types: Cigarettes    Quit date: 04/21/1968    Years since quitting: 53.0   Smokeless tobacco: Never  Vaping Use   Vaping Use: Never used  Substance and Sexual Activity   Alcohol use: No   Drug use: No   Sexual activity: Not Currently    Partners: Female    Birth control/protection: None  Other Topics Concern   Not on file  Social History Narrative   Widowed in 2014; dating again    Social Determinants of Health   Financial Resource Strain: Not on file  Food Insecurity: Not on file  Transportation Needs: Not on file  Physical Activity: Not on file  Stress: Not on file  Social Connections: Not on file     Family History: The patient's family history includes Diabetes in his mother.  ROS:   Please see the history of present illness.     All other systems reviewed and are negative.  EKGs/Labs/Other Studies Reviewed:    The following studies were reviewed today:  [facility] 11/2020 Ost LM to Mid LM lesion is 60% stenosed. Ost LAD lesion is 70% stenosed. Prox RCA to Dist RCA lesion is 100% stenosed. Prox LAD lesion is 50% stenosed. Dist LAD lesion is 35% stenosed. 1st Diag lesion is 80% stenosed. 2nd Mrg lesion is 70% stenosed. There is moderate left ventricular systolic dysfunction. LV end diastolic pressure is moderately elevated. The left ventricular ejection fraction is 35-45% by visual estimate. Hemodynamic findings consistent with mild pulmonary hypertension.   Final Conclusions:   Severe three-vessel disease Occluded RCA Severe ostial LAD disease, heavily calcified Moderate to severe  proximal LAD and OM disease Reduced ejection fraction 35% based on prior echocardiogram Symptoms of heart failure as outpatient, stable angina  Right heart pressures mildly elevated,  elevated EDP  Significant calcified plaque right femoral bifurcation  Recommendations:  Case discussed with interventional cardiology, Recommendation to have CT surgery evaluate the patient If not a surgical candidate, would be a very difficult stenting with atherectomy of the ostial LAD   Ida Rogue 11/24/2020, 10:40 AM  EKG:  EKG is ordered today.  The ekg ordered today demonstrates NSR 77bpm, PVC, LVH, IVCD  Recent Labs: 11/17/2020: BUN 17; Creatinine, Ser 1.21; Hemoglobin 12.0; Platelets 242; Potassium 4.6; Sodium 142  Recent Lipid Panel    Component Value Date/Time   CHOL 107 12/24/2018 1146   TRIG 164.0 (H) 12/24/2018 1146   HDL 35.90 (L) 12/24/2018 1146   CHOLHDL 3 12/24/2018 1146   VLDL 32.8 12/24/2018 1146   LDLCALC 38 12/24/2018 1146   LDLDIRECT 62.0 10/04/2016 0942    Physical Exam:    VS:  BP (!) 104/52 (BP Location: Left Arm, Patient Position: Sitting, Cuff Size: Normal)   Pulse 77   Ht _0  (1.803 m)   Wt 194 lb 8 oz (88.2 kg)   SpO2 95%   BMI 27.13 kg/m     Wt Readings from Last 3 Encounters:  05/04/21 194 lb 8 oz (88.2 kg)  04/04/21 195 lb (88.5 kg)  03/27/21 198 lb (89.8 kg)     GEN:  Well nourished, well developed in no acute distress HEENT: Normal NECK: No JVD; No carotid bruits LYMPHATICS: No lymphadenopathy CARDIAC: RRR, no murmurs, rubs, gallops RESPIRATORY:  Clear to auscultation without rales, wheezing or rhonchi  ABDOMEN: Soft, non-tender, non-distended MUSCULOSKELETAL:  1-2+ lower leg edema, red weeping; No deformity  SKIN: Warm and dry NEUROLOGIC:  Alert and oriented x 3 PSYCHIATRIC:  Normal affect   ASSESSMENT:    1. Bilateral lower leg cellulitis   2. PAD (peripheral artery disease) (HCC)   3. HFrEF (heart failure with reduced ejection  fraction) ([place])   4. Coronary artery disease involving native coronary artery of native heart without angina pectoris   5. History of CVA (cerebrovascular accident)   6. Bilateral lower extremity edema    PLAN:    In order of problems listed above:  Lower leg cellulitis Patient reported redness and weeping began this morning, along with lower leg edema. Some heat to the touch. Denies fever or chills. Suspect lower leg cellulitis. I will send in doxycycline 128m BID for 7 days. I will also increase lasix to 473mdaily for 4 days and then back down to 2076maily. Also, I will refer back to wound clinic.   HFrEF Echo 11/14/20 showed LVEF 35% and he was set up for cath. Cath showed 3V disease and he saw CT surgery, who felt he was not a surgical candidate and would be a difficult stenting of ostial LAD. Recommended medical management. He has been on Jardiance 51m93mily and Toprol-XL 100mg45mly. He was on previously on Torsemide but  had a skin reaction, so this was switched to lasix by PCP. PCP has been managing lasix dose for the last few months. Was also being followed by wound clinic for lower leg wounds/unna boots. Patient says he has been taking 20mg 62mthe last 2 weeks. On exam he has 1-2+ lower leg edema with weeping. Increase lasix as above. Continue with daily weights, leg elevation and low salt diet.  BMET at follow-up. BP limiting addition of GDMT, can reevaluate at follow-up.   3V CAD Cath showed severe 3V disease and plan for medical management as above. Patient denies chest pain  or worsening shortness of breath. He walks daily and feels energy is better since bp control. Continue Aspirin, BB, and statin.   PVD/carotid artery stenosis [place] 03/2020 carotid duplex showed b/l 40-59% stenosis. Followed by vascular. Continue Aspirin, Pletal, and statin.   H/o CVA Continue statin and aspirin  Disposition: Follow up in 1-2 week(s) with MD/APP     Signed, Byrdie Miyazaki Ninfa Meeker, PA-C   05/06/2021 [facility]

## 2021-05-04 ENCOUNTER — Other Ambulatory Visit: Payer: Self-pay

## 2021-05-04 ENCOUNTER — Encounter: Payer: Self-pay | Admitting: Medical

## 2021-05-04 ENCOUNTER — Ambulatory Visit (INDEPENDENT_AMBULATORY_CARE_PROVIDER_SITE_OTHER): Payer: Medicare Other | Admitting: Medical

## 2021-05-04 VITALS — BP 104/52 | HR 77 | Ht 71.0 in | Wt 194.5 lb

## 2021-05-04 DIAGNOSIS — I739 Peripheral vascular disease, unspecified: Secondary | ICD-10-CM | POA: Diagnosis not present

## 2021-05-04 DIAGNOSIS — I10 Essential (primary) hypertension: Secondary | ICD-10-CM

## 2021-05-04 DIAGNOSIS — R600 Localized edema: Secondary | ICD-10-CM

## 2021-05-04 DIAGNOSIS — L03116 Cellulitis of left lower limb: Secondary | ICD-10-CM | POA: Diagnosis not present

## 2021-05-04 DIAGNOSIS — E785 Hyperlipidemia, unspecified: Secondary | ICD-10-CM

## 2021-05-04 DIAGNOSIS — L03115 Cellulitis of right lower limb: Secondary | ICD-10-CM

## 2021-05-04 DIAGNOSIS — I5020 Unspecified systolic (congestive) heart failure: Secondary | ICD-10-CM | POA: Diagnosis not present

## 2021-05-04 DIAGNOSIS — Z8673 Personal history of transient ischemic attack (TIA), and cerebral infarction without residual deficits: Secondary | ICD-10-CM

## 2021-05-04 DIAGNOSIS — I2510 Atherosclerotic heart disease of native coronary artery without angina pectoris: Secondary | ICD-10-CM | POA: Diagnosis not present

## 2021-05-04 MED ORDER — DOXYCYCLINE HYCLATE 100 MG PO TABS: ORAL_TABLET | ORAL | 0 refills | Status: DC

## 2021-05-04 NOTE — Patient Instructions (Signed)
Medication Instructions:  - Your physician has recommended you make the following change in your medication:   1) START doxycycline 100 mg- take 1 tablet by mouth TWICE daily x 7 days  2) CHANGE lasix (furosemide) 40 mg: - take 1 tablet (40 mg) by mouth once daily x 4 days, then - take 0.5 tablet (20 mg) by mouth once daily   *If you need a refill on your cardiac medications before your next appointment, please call your pharmacy*   Lab Work: - none ordered  If you have labs (blood work) drawn today and your tests are completely normal, you will receive your results only by: [place] (if you have MyChart) OR A paper copy in the mail If you have any lab test that is abnormal or we need to change your treatment, we will call you to review the results.   Testing/Procedures: - You have been referred to : [facility] Their clinic will call you directly to schedule an appointment    Follow-Up: At [facility], you and your health needs are our priority.  As part of our continuing mission to provide you with exceptional heart care, we have created designated Provider Care Teams.  These Care Teams include your primary Cardiologist (physician) and Advanced Practice Providers (APPs -  Physician Assistants and Nurse Practitioners) who all work together to provide you with the care you need, when you need it.  We recommend signing up for the patient portal called "MyChart".  Sign up information is provided on this After Visit Summary.  MyChart is used to connect with patients for Virtual Visits (Telemedicine).  Patients are able to view lab/test results, encounter notes, upcoming appointments, etc.  Non-urgent messages can be sent to your provider as well.   To learn more about what you can do with MyChart, go to [email or web address].    Your next appointment:   May 15, 2021   The format for your next appointment:   In Person  Provider:   Murray Hodgkins,  NP   Other Instructions N/a

## 2021-05-06 ENCOUNTER — Encounter: Payer: Self-pay | Admitting: Medical

## 2021-05-15 ENCOUNTER — Ambulatory Visit (INDEPENDENT_AMBULATORY_CARE_PROVIDER_SITE_OTHER): Payer: Medicare Other | Admitting: Nurse Practitioner

## 2021-05-15 ENCOUNTER — Encounter: Payer: Self-pay | Admitting: Nurse Practitioner

## 2021-05-15 ENCOUNTER — Encounter: Payer: Medicare Other | Attending: Physician Assistant | Admitting: Physician Assistant

## 2021-05-15 ENCOUNTER — Other Ambulatory Visit: Payer: Self-pay

## 2021-05-15 VITALS — BP 110/60 | HR 73 | Ht 71.0 in | Wt 192.0 lb

## 2021-05-15 DIAGNOSIS — R21 Rash and other nonspecific skin eruption: Secondary | ICD-10-CM | POA: Insufficient documentation

## 2021-05-15 DIAGNOSIS — I2510 Atherosclerotic heart disease of native coronary artery without angina pectoris: Secondary | ICD-10-CM | POA: Diagnosis not present

## 2021-05-15 DIAGNOSIS — I255 Ischemic cardiomyopathy: Secondary | ICD-10-CM | POA: Diagnosis not present

## 2021-05-15 DIAGNOSIS — I5022 Chronic systolic (congestive) heart failure: Secondary | ICD-10-CM

## 2021-05-15 DIAGNOSIS — I739 Peripheral vascular disease, unspecified: Secondary | ICD-10-CM

## 2021-05-15 DIAGNOSIS — I10 Essential (primary) hypertension: Secondary | ICD-10-CM

## 2021-05-15 DIAGNOSIS — I872 Venous insufficiency (chronic) (peripheral): Secondary | ICD-10-CM | POA: Diagnosis not present

## 2021-05-15 DIAGNOSIS — I890 Lymphedema, not elsewhere classified: Secondary | ICD-10-CM | POA: Insufficient documentation

## 2021-05-15 DIAGNOSIS — E785 Hyperlipidemia, unspecified: Secondary | ICD-10-CM

## 2021-05-15 DIAGNOSIS — E119 Type 2 diabetes mellitus without complications: Secondary | ICD-10-CM | POA: Insufficient documentation

## 2021-05-15 NOTE — Progress Notes (Signed)
Office Visit    Patient Name: Henry Lindsey Date of Encounter: 05/15/2021  Primary Care Provider:  Baxter Hire, MD Primary Cardiologist:  Ida Rogue, MD  Chief Complaint    87 year old male with a history of CAD, ischemic cardiomyopathy, HFrEF, hypertension, hyperlipidemia, diabetes, GERD, stroke, peripheral arterial disease, and carotid arterial disease, who presents for follow-up related to HFrEF and lower extremity cellulitis.  Past Medical History    Past Medical History:  Diagnosis Date   Arthritis    BPH (benign prostatic hyperplasia)    CAD (coronary artery disease)    a. 11/2020 Cath: LM 50d, LAD sev ost dzs, mod to sev prox/mid dzs. D1 sev dzs. LCX mild prox dzs, OM2 mod-sev dzs, RCA 100p. RPL and RPDA fill via L-->R collats. EF 35%. Seen by CT surgery-->Med Rx advised.   Carotid artery stenosis    a. 11/2010 U/S: <50% bilaterally; b. 03/2021 40-59% bilat ICA stenoses.   COPD (chronic obstructive pulmonary disease) ([place])    noted CT 10/05/16 former smoker quit age 89    CVA (cerebral infarction) 11/2010   r thalamic lacunar   Diabetes mellitus    GERD (gastroesophageal reflux disease)    HFrEF (heart failure with reduced ejection fraction) ([place])    a. 10/2020 Echo: EF 35%.   Hyperlipidemia    Hypertension    Ischemic cardiomyopathy    a. 10/2020 Echo: EF 35%. Nl RVSP. Mod MR. Mild TR; b. 11/2020 Cath:  CO/CI 5.05/2.49. LV gram: EF 35%.   Lower extremity cellulitis    Neuromuscular disorder ([place])    Peripheral vascular disease in diabetes mellitus ([place])    a. 06/2012 95% occlusion s/p PTA R SFA (Dr. Lucky Cowboy); b. 03/2021 ABIs: R 1.02, L 0.96.   Stroke [facility])    Past Surgical History:  Procedure Laterality Date   CHOLECYSTECTOMY N/A 11/13/2016   Procedure: LAPAROSCOPIC CHOLECYSTECTOMY WITH INTRAOPERATIVE CHOLANGIOGRAM;  Surgeon: Olean Ree, MD;  Location: [facility] ORS;  Service: General;  Laterality: N/A;   COLONOSCOPY WITH PROPOFOL N/A 02/10/2018   Procedure:  COLONOSCOPY WITH PROPOFOL;  Surgeon: Lucilla Lame, MD;  Location: [facility] ENDOSCOPY;  Service: Endoscopy;  Laterality: N/A;   ERCP N/A 11/17/2016   Procedure: ENDOSCOPIC RETROGRADE CHOLANGIOPANCREATOGRAPHY (ERCP);  Surgeon: Irene Shipper, MD;  Location: [facility] ENDOSCOPY;  Service: Endoscopy;  Laterality: N/A;   ERCP N/A 02/04/2017   Procedure: ENDOSCOPIC RETROGRADE CHOLANGIOPANCREATOGRAPHY (ERCP) Stent removal;  Surgeon: Lucilla Lame, MD;  Location: [facility] ENDOSCOPY;  Service: Endoscopy;  Laterality: N/A;   IR GENERIC HISTORICAL  10/06/2016   IR PERC CHOLECYSTOSTOMY 10/06/2016 Aletta Edouard, MD [facility]-INTER[facility]   JOINT REPLACEMENT Left 2014   left knee   RIGHT/LEFT HEART CATH AND CORONARY ANGIOGRAPHY N/A 11/24/2020   Procedure: RIGHT/LEFT HEART CATH AND CORONARY ANGIOGRAPHY;  Surgeon: Minna Merritts, MD;  Location: [facility] CV LAB;  Service: Cardiovascular;  Laterality: N/A;   TOTAL KNEE ARTHROPLASTY Right 08/08/2020   Procedure: TOTAL KNEE ARTHROPLASTY;  Surgeon: Lovell Sheehan, MD;  Location: [facility] ORS;  Service: Orthopedics;  Laterality: Right;   UPPER GASTROINTESTINAL ENDOSCOPY     WRIST SURGERY      Allergies  Allergies  Allergen Reactions   Jardiance [Empagliflozin]     Rash all over and lips turn purple.    Metformin And Related Diarrhea    History of Present Illness    87 year old male with the above complex past medical history including CAD, ischemic cardiomyopathy, HFrEF, hypertension, hyperlipidemia, diabetes, GERD, stroke, peripheral arterial disease, carotid  disease, and lower extremity cellulitis.  In the setting of respiratory failure, he was diagnosed with bronchitis and pneumonia in December 2021.  An echocardiogram in January 2022 showed an EF of 35% with normal RVSP, moderate MR, mild TR, and mild pericardial effusion.  He was placed on Lasix and referred to cardiology.  He subsequently underwent diagnostic catheterization in February 2022 showing severe diffuse LAD, diagonal,  and obtuse marginal disease with an occluded right coronary artery.  Cardiac output and index were intact.  EF was 35%.  He was evaluated by CT surgery and felt to be a poor surgical candidate and therefore has been medically managed since.  He was last seen in clinic on July 15 with complaints of increasing lower extremity swelling, weeping, and redness.  He was diagnosed with lower extremity cellulitis and placed on a 7-day course of doxycycline.  His Lasix was increased to 40 mg daily x4 days with plan to resume previous home dose of 20 mg daily afterward.  Since his last visit, he has had significant improvement in lower extremity swelling, less erythema, and resolution of weeping.  He is chronic, stable dyspnea on exertion but denies chest pain, palpitations, PND, orthopnea, dizziness, syncope, or early satiety.  Sometimes notes numbness in bilateral hands when he is laying in bed and this improves with sitting up.  Home Medications    Current Outpatient Medications  Medication Sig Dispense Refill   albuterol (VENTOLIN HFA) 108 (90 Base) MCG/ACT inhaler Inhale 2 puffs into the lungs every 6 (six) hours as needed.     ascorbic acid (VITAMIN C) 500 MG tablet Take 500 mg by mouth daily.      aspirin EC 81 MG tablet Take 1 tablet (81 mg total) by mouth daily. Swallow whole. 90 tablet 3   atorvastatin (LIPITOR) 40 MG tablet Take 1 tablet (40 mg total) by mouth daily. 90 tablet 3   Blood Glucose Monitoring Suppl (ONE TOUCH ULTRA SYSTEM KIT) w/Device KIT 1 kit by Does not apply route once. Use DX code E11.59 1 each 0   cholecalciferol (VITAMIN D) 25 MCG (1000 UNIT) tablet Take 1,000 Units by mouth daily.     cholestyramine (QUESTRAN) 4 g packet Take 4 g by mouth 3 (three) times daily with meals.     cilostazol (PLETAL) 100 MG tablet Take 100 mg by mouth 2 (two) times daily.     diclofenac Sodium (VOLTAREN) 1 % GEL Apply 2 g topically 2 (two) times daily as needed (pain).     furosemide (LASIX) 40 MG  tablet Take 0.5 tablet (20 mg) by mouth once daily      gabapentin (NEURONTIN) 400 MG capsule Take 400 mg by mouth 2 (two) times daily.     glipiZIDE (GLUCOTROL) 10 MG tablet Take 10 mg by mouth 2 (two) times daily before a meal.     HYDROcodone-acetaminophen (NORCO/VICODIN) 5-325 MG tablet Take 1 tablet by mouth every 6 (six) hours as needed.     INS SYRINGE/NEEDLE 1CC/28G (B-D INSULIN SYRINGE 1CC/28G) 28G X 1/2" 1 ML MISC USE TO ADMINISTER INSULIN DAILY 100 each 0   insulin NPH-regular Human (70-30) 100 UNIT/ML injection Inject 23 Units into the skin in the morning and at bedtime.      latanoprost (XALATAN) 0.005 % ophthalmic solution Place 2 drops into both eyes at bedtime.   5   methylPREDNISolone (MEDROL DOSEPAK) 4 MG TBPK tablet See admin instructions. follow package directions     metoprolol succinate (TOPROL-XL) 100 MG  24 hr tablet Take 1 tablet (100 mg total) by mouth daily. Take with or immediately following a meal. 90 tablet 3   Multiple Vitamin (MULTIVITAMIN WITH MINERALS) TABS tablet Take 1 tablet by mouth daily.     nitroGLYCERIN (NITROSTAT) 0.4 MG SL tablet Place 1 tablet (0.4 mg total) under the tongue every 5 (five) minutes as needed for chest pain. MAXIMUM 3 TABLETS 50 tablet 3   ONE TOUCH ULTRA TEST test strip USE 1 STRIP TO TEST THREE TIMES DAILY AS DIRECTED 100 each 0   ONETOUCH DELICA LANCETS 96E MISC Use three times daily to check blood sugar. 100 each 11   pantoprazole (PROTONIX) 40 MG tablet Take 40 mg by mouth daily.     silver sulfADIAZINE (SILVADENE) 1 % cream Apply topically.     SSD, SILVER SULFADIAZINE, EX Apply 50 g topically as needed.     tamsulosin (FLOMAX) 0.4 MG CAPS capsule Take 0.4 mg by mouth daily after breakfast.     timolol (TIMOPTIC) 0.5 % ophthalmic solution Place 2 drops into both eyes daily.   2   traZODone (DESYREL) 50 MG tablet trazodone 50 mg tablet     vitamin B-12 (CYANOCOBALAMIN) 500 MCG tablet Take 500 mcg by mouth daily.     vitamin E 180 MG  (400 UNITS) capsule Take 400 Units by mouth daily.     doxycycline (VIBRA-TABS) 100 MG tablet Take 1 tablet (100 mg) by mouth twice daily x 7 days (Patient not taking: Reported on 05/15/2021) 14 tablet 0   HYDROcodone bit-homatropine (HYCODAN) 5-1.5 MG/5ML syrup Hydromet 5 mg-1.5 mg/5 mL oral syrup  TK 5 ML PO QHS PRN COU (Patient not taking: Reported on 05/15/2021)     JARDIANCE 25 MG TABS tablet Take 25 mg by mouth daily. (Patient not taking: Reported on 05/15/2021)     potassium chloride (KLOR-CON) 10 MEQ tablet Take 1 tablet (10 mEq total) by mouth daily. 90 tablet 3   No current facility-administered medications for this visit.     Review of Systems    Significant improvement in lower extremity swelling and erythema with resolution of weeping.  He has been noticing some numbness in his hands when he lays in bed which improves when he changes position/sits up.  He has chronic dyspnea exertion which has been stable.  He denies chest pain, palpitations, PND, orthopnea, dizziness, syncope, or early satiety.  All other systems reviewed and are otherwise negative except as noted above.  Physical Exam    VS:  BP 110/60 (BP Location: Left Arm, Patient Position: Sitting, Cuff Size: Normal)   Pulse 73   Ht '5\' 11"'  (1.803 m)   Wt 192 lb (87.1 kg)   SpO2 93%   BMI 26.78 kg/m  , BMI Body mass index is 26.78 kg/m.     GEN: Well nourished, well developed, in no acute distress. HEENT: normal. Neck: Supple, no JVD, carotid bruits, or masses. Cardiac: RRR, 2/6 blowing quality murmur at the apex, no rubs, or gallops. No clubbing, cyanosis, trace to 1+ bilateral lower extremity woody edema with erythema.  Radials 2+/PT 1+ and equal bilaterally.  Respiratory:  Respirations regular and unlabored, clear to auscultation bilaterally. GI: Soft, nontender, nondistended, BS + x 4. MS: no deformity or atrophy. Skin: warm and dry, no rash. Neuro:  Strength and sensation are intact. Psych: Normal  affect.  Accessory Clinical Findings     Lab Results  Component Value Date   WBC 6.7 11/17/2020   HGB 12.0 (L)  11/17/2020   HCT 37.4 (L) 11/17/2020   MCV 81 11/17/2020   PLT 242 11/17/2020   Lab Results  Component Value Date   CREATININE 1.21 11/17/2020   BUN 17 11/17/2020   NA 142 11/17/2020   K 4.6 11/17/2020   CL 107 (H) 11/17/2020   CO2 21 11/17/2020   Lab Results  Component Value Date   ALT 15 04/01/2019   AST 14 04/01/2019   ALKPHOS 68 04/01/2019   BILITOT 0.8 04/01/2019   Lab Results  Component Value Date   CHOL 107 12/24/2018   HDL 35.90 (L) 12/24/2018   LDLCALC 38 12/24/2018   LDLDIRECT 62.0 10/04/2016   TRIG 164.0 (H) 12/24/2018   CHOLHDL 3 12/24/2018    Lab Results  Component Value Date   HGBA1C 6.7 (H) 08/08/2020   Assessment & Plan    1.  Chronic HFrEF/ICM: Patient was evaluated on July 15 with worsening lower extremity edema, erythema, and diagnosis of lower extremity cellulitis as well as worsening volume excess.  He responded well to oral doxycycline as well as escalation of diuretic dosing and feels that his legs look much better now his weight is down slightly since his last visit.  He has trace to 1+ bilateral woody edema today, which he notes is his baseline.  He has been wearing his compression socks and is very careful with his salt intake.  He is otherwise euvolemic on exam.  He is currently taking Lasix 20 mg daily but will take an extra half a tablet for weight gain of 2 to 3 pounds in 24 hours or worsening swelling.  He otherwise remains on beta-blocker therapy.  He did not tolerate Jardiance secondary to a rash.  Soft blood pressures have prevented utilization of spironolactone or ARB/Entresto.  I note that he is on Pletal therapy in the setting of peripheral vascular disease.  With development of LV dysfunction/heart failure, and alternate should be sought-defer to vascular surgery.  2.  Coronary artery disease: Status post catheterization in  February revealing moderate to severe diffuse multivessel disease with an occluded right coronary artery.  He was seen by CT surgery with recommendation for medical therapy.  He has not been having any chest pain and remains on aspirin, statin, and beta-blocker therapy.  3.  Essential hypertension: Blood pressures actually trend on the soft side.  He is on beta-blocker and diuretic therapy only.  4.  Hyperlipidemia: LDL of 38 in 2020.  This followed by primary care.  He remains on statin therapy.  5.  Type 2 diabetes mellitus: Followed by primary care.  He did not tolerate Jardiance.  6.  Peripheral arterial disease: Followed by vascular surgery.  Patient is on Pletal and has been on this chronically.  With LV dysfunction, recommend alternative or discontinuation.  7.   Lower extremity cellulitis: This is resolved following antibiotic course.  8.  Disposition: Follow-up with Dr. Rockey Situ in 3 months or sooner if necessary.  Murray Hodgkins, NP 05/15/2021, 3:00 PM

## 2021-05-15 NOTE — Progress Notes (Addendum)
JAKING, BRASINGTON ([id number]) Visit Report for 05/15/2021 Chief Complaint Document Details Patient Name: Henry Lindsey, Tay P. Date of Service: 05/15/2021 8:00 AM Medical Record Number: [id number] Patient Account Number: [id number] Date of Birth/Sex: November 07, 1932 (88 y.o. M) Treating RN: Carlene Coria Primary Care Provider: Harrel Lemon Other Clinician: Referring Provider: Harrel Lemon Treating Provider/Extender: Jeri Cos Weeks in Treatment: 0 Information Obtained from: Patient Chief Complaint Left leg ulcer Electronic Signature(s) Signed: 05/15/2021 8:14:30 AM By: Worthy Keeler PA-C Entered By: Worthy Keeler on 05/15/2021 08:14:29 Sherk, Arnoldo P. ([id number]) -------------------------------------------------------------------------------- HPI Details Patient Name: Utke, Brydon P. Date of Service: 05/15/2021 8:00 AM Medical Record Number: [id number] Patient Account Number: [id number] Date of Birth/Sex: 10-Jan-1933 (88 y.o. M) Treating RN: Carlene Coria Primary Care Provider: Harrel Lemon Other Clinician: Referring Provider: Harrel Lemon Treating Provider/Extender: Skipper Cliche in Treatment: 0 History of Present Illness HPI Description: 04/09/2021 upon evaluation today patient presents for initial evaluation here in our clinic concerning issues he has been having with a wound on his left leg. Both legs have actually been giving him trouble. He does have an ABI which was performed previously at vascular. This showed that he had a right ABI 1.02 with a TBI of 0.44 and a left ABI of 0.97 with a TBI of 0.51. With this being said he may have some notations with regard to his blood flow but nothing so significant that I think he could not heal. I also think he could Tolerate compression. With that being said the big thing they are just making sure that we can get him to the point where he is not weeping or leaking so that he can wear the compression without complication. He has a lot of dry  flaky skin noted where obviously there is been some issues going on here. The patient does have a history of chronic venous insufficiency, lymphedema, and diabetes mellitus type 2. 04/16/2021 upon evaluation today patient appears to be doing excellent in fact he appears to be completely healed based on what I see today. There does not appear to be any evidence of active infection which is great news and overall very pleased with where things stand today. No fevers, chills, nausea, vomiting, or diarrhea. 05/15/2021 upon inspection today patient came back in for reevaluation although to be honest he does not have any open wounds currently he has a rash over his face, arms, neck, and lower extremities which seems to be a reaction to his Vania Rea that he started about a month ago he tells me that when he saw the rash show up. He is can actually see his primary care provider tomorrow and I think that is probably the best thing to do he did stop the [place] today. Nonetheless I do not really think this has anything to do with his legs in fact his legs seem to be doing quite well. Electronic Signature(s) Signed: 05/15/2021 6:12:04 PM By: Worthy Keeler PA-C Entered By: Worthy Keeler on 05/15/2021 18:12:04 Elliott, Corran P. ([id number]) -------------------------------------------------------------------------------- Physical Exam Details Patient Name: Tammen, Talin P. Date of Service: 05/15/2021 8:00 AM Medical Record Number: [id number] Patient Account Number: [id number] Date of Birth/Sex: 10/22/1932 (88 y.o. M) Treating RN: Carlene Coria Primary Care Provider: Harrel Lemon Other Clinician: Referring Provider: Harrel Lemon Treating Provider/Extender: Jeri Cos Weeks in Treatment: 0 Constitutional Well-nourished and well-hydrated in no acute distress. Respiratory normal breathing without difficulty. Psychiatric this patient is able to make decisions and demonstrates good insight into disease  process. Alert and  Oriented x 3. pleasant and cooperative. Notes Patient again appears to be doing well in regard to his legs bilaterally I do not see any open wounds right now and overall I think he is really doing quite well. I do not think that there is anything going on with his legs from a wound care standpoint that needs to be addressed by [place] otherwise I think he was doing well other than the reaction to the medication. Electronic Signature(s) Signed: 05/15/2021 6:12:34 PM By: Worthy Keeler PA-C Entered By: Worthy Keeler on 05/15/2021 18:12:34 Ramos, Demoni PMarland Kitchen ([id number]) -------------------------------------------------------------------------------- Physician Orders Details Patient Name: Lambson, Azahel P. Date of Service: 05/15/2021 8:00 AM Medical Record Number: [id number] Patient Account Number: [id number] Date of Birth/Sex: 1933/03/25 (88 y.o. M) Treating RN: Carlene Coria Primary Care Provider: Harrel Lemon Other Clinician: Referring Provider: Harrel Lemon Treating Provider/Extender: Skipper Cliche in Treatment: 0 Verbal / Phone Orders: No Diagnosis Coding Discharge From [facility] Services o Discharge from [facility] Complete - patient to follow up MD in ref to [facility] reaction . patient to apply eucerin every evening and wear compression socks on in the am off in the pm Electronic Signature(s) Signed: 05/15/2021 9:49:53 AM By: Carlene Coria RN Signed: 05/16/2021 5:04:06 PM By: Worthy Keeler PA-C Entered By: Carlene Coria on 05/15/2021 09:49:53 Mczeal, Laydon P. ([id number]) -------------------------------------------------------------------------------- Problem List Details Patient Name: Curet, Christopherjohn P. Date of Service: 05/15/2021 8:00 AM Medical Record Number: [id number] Patient Account Number: [id number] Date of Birth/Sex: [birth year] (88 y.o. M) Treating RN: Carlene Coria Primary Care Provider: Harrel Lemon Other Clinician: Referring Provider: Harrel Lemon Treating Provider/Extender: Jeri Cos Weeks in Treatment: 0 Active Problems ICD-10 Encounter Code Description Active Date MDM Diagnosis I87.2 Venous insufficiency (chronic) (peripheral) 05/15/2021 No Yes I89.0 Lymphedema, not elsewhere classified 05/15/2021 No Yes E11.622 Type 2 diabetes mellitus with other skin ulcer 05/15/2021 No Yes Inactive Problems Resolved Problems Electronic Signature(s) Signed: 05/15/2021 6:14:10 PM By: Worthy Keeler PA-C Previous Signature: 05/15/2021 8:14:20 AM Version By: Worthy Keeler PA-C Entered By: Worthy Keeler on 05/15/2021 18:14:10 Winship, Keshawn P. ([id number]) -------------------------------------------------------------------------------- Progress Note Details Patient Name: Acres, Kayzen P. Date of Service: 05/15/2021 8:00 AM Medical Record Number: [id number] Patient Account Number: [id number] Date of Birth/Sex: 09/08/1933 (88 y.o. M) Treating RN: Carlene Coria Primary Care Provider: Harrel Lemon Other Clinician: Referring Provider: Harrel Lemon Treating Provider/Extender: Skipper Cliche in Treatment: 0 Subjective Chief Complaint Information obtained from Patient Left leg ulcer History of Present Illness (HPI) 04/09/2021 upon evaluation today patient presents for initial evaluation here in our clinic concerning issues he has been having with a wound on his left leg. Both legs have actually been giving him trouble. He does have an ABI which was performed previously at vascular. This showed that he had a right ABI 1.02 with a TBI of 0.44 and a left ABI of 0.97 with a TBI of 0.51. With this being said he may have some notations with regard to his blood flow but nothing so significant that I think he could not heal. I also think he could Tolerate compression. With that being said the big thing they are just making sure that we can get him to the point where he is not weeping or leaking so that he can wear the compression  without complication. He has a lot of dry flaky skin noted where obviously there is been some issues going on here. The patient does have a history of chronic venous insufficiency,  lymphedema, and diabetes mellitus type 2. 04/16/2021 upon evaluation today patient appears to be doing excellent in fact he appears to be completely healed based on what I see today. There does not appear to be any evidence of active infection which is great news and overall very pleased with where things stand today. No fevers, chills, nausea, vomiting, or diarrhea. 05/15/2021 upon inspection today patient came back in for reevaluation although to be honest he does not have any open wounds currently he has a rash over his face, arms, neck, and lower extremities which seems to be a reaction to his Vania Rea that he started about a month ago he tells me that when he saw the rash show up. He is can actually see his primary care provider tomorrow and I think that is probably the best thing to do he did stop the [place] today. Nonetheless I do not really think this has anything to do with his legs in fact his legs seem to be doing quite well. Patient History Information obtained from Patient. Social History Never smoker, Marital Status - Single, Alcohol Use - Never, Drug Use - No History, Caffeine Use - Daily. Medical History Eyes Denies history of Cataracts, Glaucoma, Optic Neuritis Ear/Nose/Mouth/Throat Denies history of Chronic sinus problems/congestion, Middle ear problems Hematologic/Lymphatic Denies history of Anemia, Hemophilia, Human Immunodeficiency Virus, Lymphedema, Sickle Cell Disease Respiratory Denies history of Aspiration, Asthma, Chronic Obstructive Pulmonary Disease (COPD), Pneumothorax, Sleep Apnea, Tuberculosis Cardiovascular Patient has history of Hypertension Denies history of Angina, Arrhythmia, Congestive Heart Failure, Coronary Artery Disease, Deep Vein Thrombosis, Hypotension,  Myocardial Infarction, Peripheral Arterial Disease, Peripheral Venous Disease, Phlebitis, Vasculitis Gastrointestinal Denies history of Cirrhosis , Colitis, Crohn s, Hepatitis A, Hepatitis B, Hepatitis C Endocrine Patient has history of Type II Diabetes Denies history of Type I Diabetes Genitourinary Denies history of End Stage Renal Disease Immunological Denies history of Lupus Erythematosus, Raynaud s, Scleroderma Integumentary (Skin) Denies history of History of Burn, History of pressure wounds Musculoskeletal Denies history of Gout, Rheumatoid Arthritis, Osteoarthritis, Osteomyelitis Neurologic Patient has history of Neuropathy Denies history of Dementia, Quadriplegia, Seizure Disorder Oncologic Denies history of Received Chemotherapy, Received Radiation Psychiatric Denies history of Anorexia/bulimia, Confinement Anxiety Tramell, Macguire P. ([id number]) Objective Constitutional Well-nourished and well-hydrated in no acute distress. Vitals Time Taken: 8:17 AM, Temperature: 98.1 F, Pulse: 74 bpm, Respiratory Rate: 18 breaths/min, Blood Pressure: 108/63 mmHg. Respiratory normal breathing without difficulty. Psychiatric this patient is able to make decisions and demonstrates good insight into disease process. Alert and Oriented x 3. pleasant and cooperative. General Notes: Patient again appears to be doing well in regard to his legs bilaterally I do not see any open wounds right now and overall I think he is really doing quite well. I do not think that there is anything going on with his legs from a wound care standpoint that needs to be addressed by [place] otherwise I think he was doing well other than the reaction to the medication. Assessment Active Problems ICD-10 Venous insufficiency (chronic) (peripheral) Lymphedema, not elsewhere classified Type 2 diabetes mellitus with other skin ulcer Plan Discharge From [facility] Services: Discharge from [facility] Complete -  patient to follow up MD in ref to [facility] reaction . patient to apply eucerin every evening and wear compression socks on in the am off in the pm 1. Would recommend currently that we have the patient going to continue with the wound care measures as before and he is in agreement with plan this includes the  use of the Eucerin in the evening and wearing his compression socks during the day he is doing a great job with that and overall it seems to be doing excellent for him. 2. With regard to the reaction to the Jardiance I recommended that he do follow-up with his primary care provider which I think he has scheduled for tomorrow. We will see the patient back for follow-up visit as needed. Electronic Signature(s) Signed: 05/15/2021 6:14:21 PM By: Worthy Keeler PA-C Previous Signature: 05/15/2021 6:13:10 PM Version By: Worthy Keeler PA-C Entered By: Worthy Keeler on 05/15/2021 18:14:20 Moline, Johnchristopher P. ([id number]) -------------------------------------------------------------------------------- ROS/PFSH Details Patient Name: Wayment, Attikus P. Date of Service: 05/15/2021 8:00 AM Medical Record Number: [id number] Patient Account Number: [id number] Date of Birth/Sex: Dec 19, 1932 (88 y.o. M) Treating RN: Carlene Coria Primary Care Provider: Harrel Lemon Other Clinician: Referring Provider: Harrel Lemon Treating Provider/Extender: Skipper Cliche in Treatment: 0 Information Obtained From Patient Eyes Medical History: Negative for: Cataracts; Glaucoma; Optic Neuritis Ear/Nose/Mouth/Throat Medical History: Negative for: Chronic sinus problems/congestion; Middle ear problems Hematologic/Lymphatic Medical History: Negative for: Anemia; Hemophilia; Human Immunodeficiency Virus; Lymphedema; Sickle Cell Disease Respiratory Medical History: Negative for: Aspiration; Asthma; Chronic Obstructive Pulmonary Disease (COPD); Pneumothorax; Sleep Apnea; Tuberculosis Cardiovascular Medical  History: Positive for: Hypertension Negative for: Angina; Arrhythmia; Congestive Heart Failure; Coronary Artery Disease; Deep Vein Thrombosis; Hypotension; Myocardial Infarction; Peripheral Arterial Disease; Peripheral Venous Disease; Phlebitis; Vasculitis Gastrointestinal Medical History: Negative for: Cirrhosis ; Colitis; Crohnos; Hepatitis A; Hepatitis B; Hepatitis C Endocrine Medical History: Positive for: Type II Diabetes Negative for: Type I Diabetes Time with diabetes: 20 years Treated with: Insulin Blood sugar tested every day: Yes Tested : Genitourinary Medical History: Negative for: End Stage Renal Disease Immunological Medical History: Negative for: Lupus Erythematosus; Raynaudos; Scleroderma Integumentary (Skin) Medical History: Negative for: History of Burn; History of pressure wounds Sundberg, Dayquan P. ([id number]) Musculoskeletal Medical History: Negative for: Gout; Rheumatoid Arthritis; Osteoarthritis; Osteomyelitis Neurologic Medical History: Positive for: Neuropathy Negative for: Dementia; Quadriplegia; Seizure Disorder Oncologic Medical History: Negative for: Received Chemotherapy; Received Radiation Psychiatric Medical History: Negative for: Anorexia/bulimia; Confinement Anxiety Immunizations Pneumococcal Vaccine: Received Pneumococcal Vaccination: Yes Received Pneumococcal Vaccination On or After 60th Birthday: No Implantable Devices None Family and Social History Never smoker; Marital Status - Single; Alcohol Use: Never; Drug Use: No History; Caffeine Use: Daily Electronic Signature(s) Signed: 05/16/2021 5:04:06 PM By: Worthy Keeler PA-C Signed: 05/18/2021 4:32:50 PM By: Carlene Coria RN Entered By: Worthy Keeler on 05/15/2021 18:12:20 Vanwagoner, Cassian P. ([id number]) -------------------------------------------------------------------------------- SuperBill Details Patient Name: Schrader, Esteban P. Date of Service: 05/15/2021 Medical Record Number:  [id number] Patient Account Number: [id number] Date of Birth/Sex: December 01, 1932 (88 y.o. M) Treating RN: Carlene Coria Primary Care Provider: Harrel Lemon Other Clinician: Referring Provider: Harrel Lemon Treating Provider/Extender: Jeri Cos Weeks in Treatment: 0 Diagnosis Coding ICD-10 Codes Code Description I87.2 Venous insufficiency (chronic) (peripheral) I89.0 Lymphedema, not elsewhere classified E11.622 Type 2 diabetes mellitus with other skin ulcer Facility Procedures CPT4 Code: [id number] Description: [phone] - WOUND CARE VISIT-LEV 1 EST PT Modifier: Quantity: 1 Physician Procedures CPT4 Code: [id number] Description: [id number] - WC PHYS LEVEL 4 - EST PT Modifier: Quantity: 1 CPT4 Code: Description: ICD-10 Diagnosis Description I87.2 Venous insufficiency (chronic) (peripheral) I89.0 Lymphedema, not elsewhere classified E11.622 Type 2 diabetes mellitus with other skin ulcer Modifier: Quantity: Electronic Signature(s) Signed: 05/15/2021 6:14:38 PM By: Worthy Keeler PA-C Previous Signature: 05/15/2021 9:50:31 AM Version By: Carlene Coria RN Entered By: Worthy Keeler on 05/15/2021 18:14:38

## 2021-05-15 NOTE — Patient Instructions (Addendum)
Medication Instructions:  No changes at this time.  *If you need a refill on your cardiac medications before your next appointment, please call your pharmacy*   Lab Work: None  If you have labs (blood work) drawn today and your tests are completely normal, you will receive your results only by: [place] (if you have MyChart) OR A paper copy in the mail If you have any lab test that is abnormal or we need to change your treatment, we will call you to review the results.   Testing/Procedures: None   Follow-Up: At [facility], you and your health needs are our priority.  As part of our continuing mission to provide you with exceptional heart care, we have created designated Provider Care Teams.  These Care Teams include your primary Cardiologist (physician) and Advanced Practice Providers (APPs -  Physician Assistants and Nurse Practitioners) who all work together to provide you with the care you need, when you need it.   Your next appointment:   2 month(s)  The format for your next appointment:   In Person  Provider:   Ida Rogue, MD or Murray Hodgkins, NP

## 2021-05-15 NOTE — Progress Notes (Addendum)
TOBIAS, DIRCKS ([id number]) Visit Report for 05/15/2021 Arrival Information Details Patient Name: Henry Lindsey, Henry Lindsey. Date of Service: 05/15/2021 8:00 AM Medical Record Number: [id number] Patient Account Number: [id number] Date of Birth/Sex: 06-14-1933 (88 y.o. M) Treating RN: Carlene Coria Primary Care Vicki Pasqual: Harrel Lemon Other Clinician: Referring Estanislado Surgeon: Harrel Lemon Treating Onita Pfluger/Extender: Skipper Cliche in Treatment: 0 Visit Information History Since Last Visit All ordered tests and consults were completed: No Patient Arrived: Ambulatory Added or deleted any medications: No Arrival Time: 08:12 Any new allergies or adverse reactions: No Accompanied By: self Had a fall or experienced change in No Transfer Assistance: None activities of daily living that may affect Patient Identification Verified: Yes risk of falls: Secondary Verification Process Completed: Yes Signs or symptoms of abuse/neglect since last visito No Patient Requires Transmission-Based Precautions: No Hospitalized since last visit: No Patient Has Alerts: No Implantable device outside of the clinic excluding No cellular tissue based products placed in the center since last visit: Pain Present Now: No Electronic Signature(s) Signed: 05/18/2021 4:32:50 PM By: Carlene Coria RN Entered By: Carlene Coria on 05/15/2021 08:17:45 Kachmar, Jamaine P. ([id number]) -------------------------------------------------------------------------------- Clinic Level of Care Assessment Details Patient Name: [name], [name] Date of Service: 05/15/2021 8:00 AM Medical Record Number: [id number] Patient Account Number: [id number] Date of Birth/Sex: 1932/12/15 (88 y.o. M) Treating RN: Carlene Coria Primary Care Karletta Millay: Harrel Lemon Other Clinician: Referring China Deitrick: Harrel Lemon Treating Quayshaun Hubbert/Extender: Skipper Cliche in Treatment: 0 Clinic Level of Care Assessment Items TOOL 4 Quantity Score '[]'$  - Use when only an  EandM is performed on FOLLOW-UP visit 0 ASSESSMENTS - Nursing Assessment / Reassessment X - Reassessment of Co-morbidities (includes updates in patient status) 1 10 X- 1 5 Reassessment of Adherence to Treatment Plan ASSESSMENTS - Wound and Skin Assessment / Reassessment '[]'$  - Simple Wound Assessment / Reassessment - one wound 0 '[]'$  - 0 Complex Wound Assessment / Reassessment - multiple wounds '[]'$  - 0 Dermatologic / Skin Assessment (not related to wound area) ASSESSMENTS - Focused Assessment '[]'$  - Circumferential Edema Measurements - multi extremities 0 '[]'$  - 0 Nutritional Assessment / Counseling / Intervention '[]'$  - 0 Lower Extremity Assessment (monofilament, tuning fork, pulses) '[]'$  - 0 Peripheral Arterial Disease Assessment (using hand held doppler) ASSESSMENTS - Ostomy and/or Continence Assessment and Care '[]'$  - Incontinence Assessment and Management 0 '[]'$  - 0 Ostomy Care Assessment and Management (repouching, etc.) PROCESS - Coordination of Care '[]'$  - Simple Patient / Family Education for ongoing care 0 '[]'$  - 0 Complex (extensive) Patient / Family Education for ongoing care '[]'$  - 0 Staff obtains Programmer, systems, Records, Test Results / Process Orders '[]'$  - 0 Staff telephones HHA, Nursing Homes / Clarify orders / etc '[]'$  - 0 Routine Transfer to another Facility (non-emergent condition) '[]'$  - 0 Routine Hospital Admission (non-emergent condition) '[]'$  - 0 New Admissions / Biomedical engineer / Ordering NPWT, Apligraf, etc. '[]'$  - 0 Emergency Hospital Admission (emergent condition) X- 1 10 Simple Discharge Coordination '[]'$  - 0 Complex (extensive) Discharge Coordination PROCESS - Special Needs '[]'$  - Pediatric / Minor Patient Management 0 '[]'$  - 0 Isolation Patient Management '[]'$  - 0 Hearing / Language / Visual special needs '[]'$  - 0 Assessment of Community assistance (transportation, D/C planning, etc.) '[]'$  - 0 Additional assistance / Altered mentation '[]'$  - 0 Support Surface(s) Assessment  (bed, cushion, seat, etc.) INTERVENTIONS - Wound Cleansing / Measurement Mincer, Anthonymichael P. ([id number]) '[]'$  - 0 Simple Wound Cleansing - one wound '[]'$  - 0 Complex Wound Cleansing - multiple  wounds '[]'$  - 0 Wound Imaging (photographs - any number of wounds) '[]'$  - 0 Wound Tracing (instead of photographs) '[]'$  - 0 Simple Wound Measurement - one wound '[]'$  - 0 Complex Wound Measurement - multiple wounds INTERVENTIONS - Wound Dressings '[]'$  - Small Wound Dressing one or multiple wounds 0 '[]'$  - 0 Medium Wound Dressing one or multiple wounds '[]'$  - 0 Large Wound Dressing one or multiple wounds '[]'$  - 0 Application of Medications - topical '[]'$  - 0 Application of Medications - injection INTERVENTIONS - Miscellaneous '[]'$  - External ear exam 0 '[]'$  - 0 Specimen Collection (cultures, biopsies, blood, body fluids, etc.) '[]'$  - 0 Specimen(s) / Culture(s) sent or taken to Lab for analysis '[]'$  - 0 Patient Transfer (multiple staff / Civil Service fast streamer / Similar devices) '[]'$  - 0 Simple Staple / Suture removal (25 or less) '[]'$  - 0 Complex Staple / Suture removal (26 or more) '[]'$  - 0 Hypo / Hyperglycemic Management (close monitor of Blood Glucose) '[]'$  - 0 Ankle / Brachial Index (ABI) - do not check if billed separately X- 1 5 Vital Signs Has the patient been seen at the hospital within the last three years: Yes Total Score: 30 Level Of Care: New/Established - Level 1 Electronic Signature(s) Signed: 05/18/2021 4:32:50 PM By: Carlene Coria RN Entered By: Carlene Coria on 05/15/2021 09:50:21 Revere, Faiz P. ([id number]) -------------------------------------------------------------------------------- Encounter Discharge Information Details Patient Name: [name], [name] Date of Service: 05/15/2021 8:00 AM Medical Record Number: [id number] Patient Account Number: [id number] Date of Birth/Sex: 01-28-1933 (88 y.o. M) Treating RN: Carlene Coria Primary Care Rudine Rieger: Harrel Lemon Other Clinician: Referring Kimmie Berggren: Harrel Lemon Treating Anakin Varkey/Extender: Skipper Cliche in Treatment: 0 Encounter Discharge Information Items Discharge Condition: Stable Ambulatory Status: Ambulatory Discharge Destination: Home Transportation: Private Auto Accompanied By: self Schedule Follow-up Appointment: Yes Clinical Summary of Care: Patient Declined Electronic Signature(s) Signed: 05/15/2021 9:51:20 AM By: Carlene Coria RN Entered By: Carlene Coria on 05/15/2021 09:51:20 Broman, Cardell P. ([id number]) -------------------------------------------------------------------------------- Lower Extremity Assessment Details Patient Name: Islam, Leonardo P. Date of Service: 05/15/2021 8:00 AM Medical Record Number: [id number] Patient Account Number: [id number] Date of Birth/Sex: 1933-10-18 (88 y.o. M) Treating RN: Carlene Coria Primary Care Arianni Gallego: Harrel Lemon Other Clinician: Referring Keerthi Hazell: Harrel Lemon Treating Sylvan Sookdeo/Extender: Jeri Cos Weeks in Treatment: 0 Edema Assessment Assessed: [Left: No] [Right: No] [Left: Edema] [Right: :] Calf Left: Right: Point of Measurement: 43 cm From Medial Instep 33 cm 38 cm Ankle Left: Right: Point of Measurement: 8 cm From Medial Instep 23 cm 28 cm Vascular Assessment Pulses: Dorsalis Pedis Palpable: [Left:Yes] [Right:Yes] Electronic Signature(s) Signed: 05/18/2021 4:32:50 PM By: Carlene Coria RN Entered By: Carlene Coria on 05/15/2021 08:22:12 Waldrop, Cruise P. ([id number]) -------------------------------------------------------------------------------- Multi Wound Chart Details Patient Name: Boesel, Lamonta P. Date of Service: 05/15/2021 8:00 AM Medical Record Number: [id number] Patient Account Number: [id number] Date of Birth/Sex: [birth year] (88 y.o. M) Treating RN: Carlene Coria Primary Care Aubriegh Minch: Harrel Lemon Other Clinician: Referring Johsua Shevlin: Harrel Lemon Treating Fareed Fung/Extender: Jeri Cos Weeks in Treatment: 0 Vital Signs Height(in): Pulse(bpm):  74 Weight(lbs): Blood Pressure(mmHg): 108/63 Body Mass Index(BMI): Temperature(F): 98.1 Respiratory Rate(breaths/min): 18 Wound Assessments Treatment Notes Electronic Signature(s) Signed: 05/15/2021 9:48:04 AM By: Carlene Coria RN Entered By: Carlene Coria on 05/15/2021 09:48:04 Nay, Luis P. ([id number]) -------------------------------------------------------------------------------- Ramer Details Patient Name: Angst, Antwon P. Date of Service: 05/15/2021 8:00 AM Medical Record Number: [id number] Patient Account Number: [id number] Date of Birth/Sex: Jul 24, 1933 (88 y.o. M) Treating RN: Carlene Coria Primary Care Drayk Humbarger: Harrel Lemon Other  Clinician: Referring Keyler Hoge: Harrel Lemon Treating Adarryl Goldammer/Extender: Jeri Cos Weeks in Treatment: 0 Active Inactive Electronic Signature(s) Signed: 05/15/2021 9:47:46 AM By: Carlene Coria RN Entered By: Carlene Coria on 05/15/2021 09:47:46 Bonczek, Markel P. ([id number]) -------------------------------------------------------------------------------- Pain Assessment Details Patient Name: Schouten, Keyaan P. Date of Service: 05/15/2021 8:00 AM Medical Record Number: [id number] Patient Account Number: [id number] Date of Birth/Sex: [birth year] (88 y.o. M) Treating RN: Carlene Coria Primary Care Farren Nelles: Harrel Lemon Other Clinician: Referring Yosselin Zoeller: Harrel Lemon Treating Amyra Vantuyl/Extender: Jeri Cos Weeks in Treatment: 0 Active Problems Location of Pain Severity and Description of Pain Patient Has Paino No Site Locations Pain Management and Medication Current Pain Management: Electronic Signature(s) Signed: 05/18/2021 4:32:50 PM By: Carlene Coria RN Entered By: Carlene Coria on 05/15/2021 08:18:31 Erskin, Helmer P. ([id number]) -------------------------------------------------------------------------------- Patient/Caregiver Education Details Patient Name: Marullo, Renji P. Date of Service: 05/15/2021 8:00 AM Medical  Record Number: [id number] Patient Account Number: [id number] Date of Birth/Gender: [birth year] (88 y.o. M) Treating RN: Carlene Coria Primary Care Physician: Harrel Lemon Other Clinician: Referring Physician: Harrel Lemon Treating Physician/Extender: Skipper Cliche in Treatment: 0 Education Assessment Education Provided To: Patient Education Topics Provided Medication Safety: Methods: Explain/Verbal Responses: State content correctly Electronic Signature(s) Signed: 05/18/2021 4:32:50 PM By: Carlene Coria RN Entered By: Carlene Coria on 05/15/2021 09:50:47 Down, Kiril P. ([id number]) -------------------------------------------------------------------------------- Vitals Details Patient Name: Bobb, Cheskel P. Date of Service: 05/15/2021 8:00 AM Medical Record Number: [id number] Patient Account Number: [id number] Date of Birth/Sex: 29-Nov-1932 (88 y.o. M) Treating RN: Carlene Coria Primary Care Roxie Kreeger: Harrel Lemon Other Clinician: Referring Jakiera Ehler: Harrel Lemon Treating Raylei Losurdo/Extender: Jeri Cos Weeks in Treatment: 0 Vital Signs Time Taken: 08:17 Temperature (F): 98.1 Pulse (bpm): 74 Respiratory Rate (breaths/min): 18 Blood Pressure (mmHg): 108/63 Reference Range: 80 - 120 mg / dl Electronic Signature(s) Signed: 05/18/2021 4:32:50 PM By: Carlene Coria RN Entered By: Carlene Coria on 05/15/2021 [id number]

## 2021-05-16 ENCOUNTER — Encounter (INDEPENDENT_AMBULATORY_CARE_PROVIDER_SITE_OTHER): Payer: Medicare Other

## 2021-05-17 ENCOUNTER — Telehealth: Payer: Self-pay | Admitting: Nurse Practitioner

## 2021-05-17 ENCOUNTER — Other Ambulatory Visit: Payer: Self-pay | Admitting: Nurse Practitioner

## 2021-05-17 NOTE — Telephone Encounter (Signed)
   Pt seen in clinic on 7/26 for f/u of CHF and LE cellulitis.  I noted that he was taking cilostazol therapy for h/o PAD.  This is contraindicated in the setting of CHF/LV dysfxn.  I reached out to Dr. Lucky Cowboy, who was ok w/ discontinuation of cilostazol.  I reached out to Mr. Henry Lindsey this afternoon to discuss and advise discontinuation.    Caller verbalized understanding and was grateful for the call.  I have removed cilostazol from him med list.  Murray Hodgkins, NP 05/17/2021, 4:18 PM

## 2021-06-27 ENCOUNTER — Encounter (INDEPENDENT_AMBULATORY_CARE_PROVIDER_SITE_OTHER): Payer: Medicare Other

## 2021-07-20 ENCOUNTER — Encounter: Payer: Self-pay | Admitting: Nurse Practitioner

## 2021-07-20 ENCOUNTER — Ambulatory Visit (INDEPENDENT_AMBULATORY_CARE_PROVIDER_SITE_OTHER): Payer: Medicare Other | Admitting: Nurse Practitioner

## 2021-07-20 ENCOUNTER — Other Ambulatory Visit: Payer: Self-pay

## 2021-07-20 VITALS — BP 110/62 | HR 64 | Ht 71.0 in | Wt 189.0 lb

## 2021-07-20 DIAGNOSIS — I5022 Chronic systolic (congestive) heart failure: Secondary | ICD-10-CM

## 2021-07-20 DIAGNOSIS — I2510 Atherosclerotic heart disease of native coronary artery without angina pectoris: Secondary | ICD-10-CM

## 2021-07-20 DIAGNOSIS — I10 Essential (primary) hypertension: Secondary | ICD-10-CM

## 2021-07-20 DIAGNOSIS — I739 Peripheral vascular disease, unspecified: Secondary | ICD-10-CM

## 2021-07-20 DIAGNOSIS — E785 Hyperlipidemia, unspecified: Secondary | ICD-10-CM

## 2021-07-20 DIAGNOSIS — E1165 Type 2 diabetes mellitus with hyperglycemia: Secondary | ICD-10-CM

## 2021-07-20 DIAGNOSIS — IMO0002 Reserved for concepts with insufficient information to code with codable children: Secondary | ICD-10-CM

## 2021-07-20 DIAGNOSIS — I255 Ischemic cardiomyopathy: Secondary | ICD-10-CM | POA: Diagnosis not present

## 2021-07-20 DIAGNOSIS — Z794 Long term (current) use of insulin: Secondary | ICD-10-CM

## 2021-07-20 DIAGNOSIS — E1142 Type 2 diabetes mellitus with diabetic polyneuropathy: Secondary | ICD-10-CM

## 2021-07-20 MED ORDER — FUROSEMIDE 40 MG PO TABS: 40.0000 mg | ORAL_TABLET | Freq: Every day | ORAL | Status: DC

## 2021-07-20 NOTE — Patient Instructions (Signed)
Medication Instructions:  No changes today  *If you need a refill on your cardiac medications before your next appointment, please call your pharmacy*   Lab Work: None  If you have labs (blood work) drawn today and your tests are completely normal, you will receive your results only by: [place] (if you have MyChart) OR A paper copy in the mail If you have any lab test that is abnormal or we need to change your treatment, we will call you to review the results.   Testing/Procedures: None   Follow-Up: At [facility], you and your health needs are our priority.  As part of our continuing mission to provide you with exceptional heart care, we have created designated Provider Care Teams.  These Care Teams include your primary Cardiologist (physician) and Advanced Practice Providers (APPs -  Physician Assistants and Nurse Practitioners) who all work together to provide you with the care you need, when you need it.   Your next appointment:   3-4   month(s)  The format for your next appointment:   In Person  Provider:   Ida Rogue, MD or Murray Hodgkins, NP

## 2021-07-20 NOTE — Progress Notes (Signed)
Office Visit    Patient Name: Henry Lindsey Date of Encounter: 07/20/2021  Primary Care Provider:  Baxter Hire, MD Primary Cardiologist:  Ida Rogue, MD  Chief Complaint    87 year old male with a history of CAD, ischemic cardiomyopathy, HFrEF, hypertension, hyperlipidemia, diabetes, GERD, stroke, peripheral arterial disease, and carotid arterial disease, who presents for follow-up related to HFrEF.  Past Medical History    Past Medical History:  Diagnosis Date   Arthritis    BPH (benign prostatic hyperplasia)    CAD (coronary artery disease)    a. 11/2020 Cath: LM 50d, LAD sev ost dzs, mod to sev prox/mid dzs. D1 sev dzs. LCX mild prox dzs, OM2 mod-sev dzs, RCA 100p. RPL and RPDA fill via L-->R collats. EF 35%. Seen by CT surgery-->Med Rx advised.   Carotid artery stenosis    a. 11/2010 U/S: <50% bilaterally; b. 03/2021 40-59% bilat ICA stenoses.   COPD (chronic obstructive pulmonary disease) ([place])    noted CT 10/05/16 former smoker quit age 31    CVA (cerebral infarction) 11/2010   r thalamic lacunar   Diabetes mellitus    GERD (gastroesophageal reflux disease)    HFrEF (heart failure with reduced ejection fraction) ([place])    a. 10/2020 Echo: EF 35%.   Hyperlipidemia    Hypertension    Ischemic cardiomyopathy    a. 10/2020 Echo: EF 35%. Nl RVSP. Mod MR. Mild TR; b. 11/2020 Cath:  CO/CI 5.05/2.49. LV gram: EF 35%.   Lower extremity cellulitis    Neuromuscular disorder ([place])    Peripheral vascular disease in diabetes mellitus ([place])    a. 06/2012 95% occlusion s/p PTA R SFA (Dr. Lucky Cowboy); b. 03/2021 ABIs: R 1.02, L 0.96.   Stroke [facility])    Past Surgical History:  Procedure Laterality Date   CHOLECYSTECTOMY N/A 11/13/2016   Procedure: LAPAROSCOPIC CHOLECYSTECTOMY WITH INTRAOPERATIVE CHOLANGIOGRAM;  Surgeon: Olean Ree, MD;  Location: [facility] ORS;  Service: General;  Laterality: N/A;   COLONOSCOPY WITH PROPOFOL N/A 02/10/2018   Procedure: COLONOSCOPY WITH PROPOFOL;  Surgeon:  Lucilla Lame, MD;  Location: [facility] ENDOSCOPY;  Service: Endoscopy;  Laterality: N/A;   ERCP N/A 11/17/2016   Procedure: ENDOSCOPIC RETROGRADE CHOLANGIOPANCREATOGRAPHY (ERCP);  Surgeon: Irene Shipper, MD;  Location: [facility] ENDOSCOPY;  Service: Endoscopy;  Laterality: N/A;   ERCP N/A 02/04/2017   Procedure: ENDOSCOPIC RETROGRADE CHOLANGIOPANCREATOGRAPHY (ERCP) Stent removal;  Surgeon: Lucilla Lame, MD;  Location: [facility] ENDOSCOPY;  Service: Endoscopy;  Laterality: N/A;   IR GENERIC HISTORICAL  10/06/2016   IR PERC CHOLECYSTOSTOMY 10/06/2016 Aletta Edouard, MD [facility]   JOINT REPLACEMENT Left 2014   left knee   RIGHT/LEFT HEART CATH AND CORONARY ANGIOGRAPHY N/A 11/24/2020   Procedure: RIGHT/LEFT HEART CATH AND CORONARY ANGIOGRAPHY;  Surgeon: Minna Merritts, MD;  Location: [facility] LAB;  Service: Cardiovascular;  Laterality: N/A;   TOTAL KNEE ARTHROPLASTY Right 08/08/2020   Procedure: TOTAL KNEE ARTHROPLASTY;  Surgeon: Lovell Sheehan, MD;  Location: [facility] ORS;  Service: Orthopedics;  Laterality: Right;   UPPER GASTROINTESTINAL ENDOSCOPY     WRIST SURGERY      Allergies  Allergies  Allergen Reactions   Jardiance [Empagliflozin]     Rash all over and lips turn purple.    Metformin And Related Diarrhea    History of Present Illness    87 year old male with above complex past medical history including CAD, ischemic cardiomyopathy, HFrEF, hypertension, hyperlipidemia, diabetes, GERD, stroke, peripheral arterial disease, carotid disease, and lower extremity cellulitis.  In the setting of respiratory failure, he was diagnosed with bronchitis and pneumonia in December 2021.  Echocardiogram in January 2022 showed an EF of 35% with normal RVSP, moderate MR, mild TR, and mild pericardial effusion.  He was placed on Lasix and referred to cardiology.  He subsequently underwent diagnostic catheterization in February 2022 showing severe diffuse LAD, diagonal, and obtuse marginal disease with an occluded  right coronary artery.  Cardiac output and index were normal.  EF is 35%.  He is evaluated by CT surgery and felt to be a poor surgical candidate, and therefore has been medically managed since.  He was seen in mid July with lower extremity swelling and redness.  He was diagnosed with cellulitis and placed on a 7-day course of doxycycline.  Lasix was also increased for 4 days.  At his most recent follow-up on July 26, he noted significant improvement in lower extremity swelling, less erythema, and resolution of weeping.  After discussion with vascular surgery, Pletal therapy was discontinued given LV dysfunction.  Since his last visit, Mr. Albro notes that he's been doing well.  He has been taking Lasix 40 mg daily and on rare occasions, he will take an extra half a tablet for weight gain.  His weight is been trending 188 to 189 pounds on his home scale (188 on his home scale this morning).  He does some walking in and around his home, as well as yard work and notes that he tolerates this without any significant dyspnea.  Is chronic, mild woody lower extremity edema.  He has not any recent weeping or worsening of baseline level of erythema/discoloration.  He denies chest pain, palpitations, PND, orthopnea, dizziness, syncope, or early satiety.  Today he reports that he has been taking Pletal because he gets restless legs at night when he does not take it.  I encouraged him to discontinue this as it is contraindicated in heart failure.  Home Medications    Current Outpatient Medications  Medication Sig Dispense Refill   albuterol (VENTOLIN HFA) 108 (90 Base) MCG/ACT inhaler Inhale 2 puffs into the lungs every 6 (six) hours as needed.     ascorbic acid (VITAMIN C) 500 MG tablet Take 500 mg by mouth daily.      aspirin EC 81 MG tablet Take 1 tablet (81 mg total) by mouth daily. Swallow whole. 90 tablet 3   atorvastatin (LIPITOR) 40 MG tablet Take 1 tablet (40 mg total) by mouth daily. 90 tablet 3   Blood  Glucose Monitoring Suppl (ONE TOUCH ULTRA SYSTEM KIT) w/Device KIT 1 kit by Does not apply route once. Use DX code E11.59 1 each 0   cholecalciferol (VITAMIN D) 25 MCG (1000 UNIT) tablet Take 1,000 Units by mouth daily.     cholestyramine (QUESTRAN) 4 g packet Take 4 g by mouth 3 (three) times daily with meals.     cilostazol (PLETAL) 100 MG tablet Take 100 mg by mouth 2 (two) times daily.     diclofenac Sodium (VOLTAREN) 1 % GEL Apply 2 g topically 2 (two) times daily as needed (pain).     gabapentin (NEURONTIN) 400 MG capsule Take 400 mg by mouth 2 (two) times daily.     glipiZIDE (GLUCOTROL) 10 MG tablet Take 10 mg by mouth 2 (two) times daily before a meal.     HYDROcodone-acetaminophen (NORCO/VICODIN) 5-325 MG tablet Take 1 tablet by mouth every 6 (six) hours as needed.     INS SYRINGE/NEEDLE 1CC/28G (B-D INSULIN SYRINGE  1CC/28G) 28G X 1/2" 1 ML MISC USE TO ADMINISTER INSULIN DAILY 100 each 0   insulin lispro protamine-lispro (HUMALOG MIX 75/25) (75-25) 100 UNIT/ML SUSP injection Inject into the skin in the morning and at bedtime.     insulin NPH-regular Human (70-30) 100 UNIT/ML injection Inject 23 Units into the skin in the morning and at bedtime.      latanoprost (XALATAN) 0.005 % ophthalmic solution Place 2 drops into both eyes at bedtime.   5   metoprolol succinate (TOPROL-XL) 100 MG 24 hr tablet Take 1 tablet (100 mg total) by mouth daily. Take with or immediately following a meal. 90 tablet 3   Multiple Vitamin (MULTIVITAMIN WITH MINERALS) TABS tablet Take 1 tablet by mouth daily.     nitroGLYCERIN (NITROSTAT) 0.4 MG SL tablet Place 1 tablet (0.4 mg total) under the tongue every 5 (five) minutes as needed for chest pain. MAXIMUM 3 TABLETS 50 tablet 3   ONE TOUCH ULTRA TEST test strip USE 1 STRIP TO TEST THREE TIMES DAILY AS DIRECTED 100 each 0   ONETOUCH DELICA LANCETS 68T MISC Use three times daily to check blood sugar. 100 each 11   pantoprazole (PROTONIX) 40 MG tablet Take 40 mg by  mouth daily.     silver sulfADIAZINE (SILVADENE) 1 % cream Apply topically.     tamsulosin (FLOMAX) 0.4 MG CAPS capsule Take 0.4 mg by mouth daily after breakfast.     timolol (TIMOPTIC) 0.5 % ophthalmic solution Place 2 drops into both eyes daily.   2   traZODone (DESYREL) 50 MG tablet trazodone 50 mg tablet     vitamin B-12 (CYANOCOBALAMIN) 500 MCG tablet Take 500 mcg by mouth daily.     vitamin E 180 MG (400 UNITS) capsule Take 400 Units by mouth daily.     furosemide (LASIX) 40 MG tablet Take 1 tablet (40 mg total) by mouth daily. May take an additional 1/2 tab for wt gain of 2 lbs in 24 hrs. 30 tablet    potassium chloride (KLOR-CON) 10 MEQ tablet Take 1 tablet (10 mEq total) by mouth daily. 90 tablet 3   No current facility-administered medications for this visit.     Review of Systems    Chronic, mild and woody lower extremity edema which has been stable.  He had restless legs when he stopped Pletal and so he resumed.  He is willing to stop again.  He denies chest pain, dyspnea, palpitations, PND, orthopnea, dizziness, syncope, or early satiety.  All other systems reviewed and are otherwise negative except as noted above.  Physical Exam    VS:  BP 110/62   Pulse 64   Ht _0  (1.803 m)   Wt 189 lb (85.7 kg)   BMI 26.36 kg/m  , BMI Body mass index is 26.36 kg/m.     GEN: Well nourished, well developed, in no acute distress. HEENT: normal. Neck: Supple, no JVD, carotid bruits, or masses. Cardiac: RRR-occasional ectopy, 2/6 blowing quality murmur at the apex, no rubs, or gallops. No clubbing, cyanosis.  Trace to 1+ bilateral lower extremity woody edema erythema.  Radials 2+/PT 1+ and equal bilaterally.  Respiratory:  Respirations regular and unlabored, clear to auscultation bilaterally. GI: Soft, nontender, nondistended, BS + x 4. MS: no deformity or atrophy. Skin: warm and dry, no rash. Neuro:  Strength and sensation are intact. Psych: Normal affect.  Accessory Clinical  Findings    ECG personally reviewed by me today -sinus rhythm, PVCs, rightward axis, incomplete  left bundle branch block, prolonged QT, nonspecific T changes- no acute changes.  Labs dated July 04, 2021-Care Everywhere  Sodium 139, potassium 4.7, chloride 101, CO2 32.8, BUN 22, creatinine 1.4, glucose 134 Hemoglobin A1c 8.2  Labs dated April 06, 2021-Care Everywhere  Total cholesterol 85, triglycerides 70, HDL 38.4, LDL 33 Total bilirubin 0.9, alkaline phosphatase 112, AST 17, ALT 13 Total protein 6.7, albumin 3.7  Assessment & Plan    1.  Chronic heart failure with reduced ejection fraction/ischemic cardiomyopathy: EF 35% by echo in January 2022.  He has chronic relatively mild bilateral woody edema with skin discoloration, which is unchanged since his last visit.  He has been taking Lasix 40 mg daily and an additional half tablet as needed for weight gain.  Fortunately, he is only required 1 additional half tablet just once in the past few months.  His weight is stable at home and 188 pounds on his scale.  He does not experience significant dyspnea.  His heart rate and blood pressure are stable and he remains on beta-blocker therapy.  Historically, soft blood pressures have prevented utilization of spironolactone or ARB/Entresto.  He previously developed a rash on empagliflozin.  Following his last visit, I discontinued cilostazol given contraindication in heart failure.  Patient says he started taking it again because of restless legs at night.  Encouraged him to discontinue cilostazol, which he was on for claudication, and seek alternate treatment for restless legs through primary care.  2.  Coronary artery disease: Status post catheterization fibber 2022 revealing moderate to severe diffuse multivessel disease with occluded right coronary artery.  He was seen by CT surgery with recommendation for medical therapy.  He has not been experiencing chest pain and is able to do quite a bit of  work around his house and in his yard without dyspnea.  He remains on aspirin, statin, and beta-blocker therapy.  3.  Essential hypertension: Blood pressure historically is soft.  He is stable at 110/62 today.  He remains on beta-blocker and diuretic therapy only.  4.  Hyperlipidemia: LDL of 33 with normal LFTs in June.  5.  Type 2 diabetes mellitus: Followed by primary care.  A1c increased to 8.2 recently as patient said he went off his usual diet.  He is now cut out sugar completely.  6.  Peripheral arterial disease: Followed by vascular surgery.  He is not having any claudication.  As above, I encouraged him to discontinue Pletal.  7.  Disposition: Follow-up with Dr. Rockey Situ in 3 to 4 months or sooner if necessary.  Murray Hodgkins, NP 07/20/2021, 12:22 PM

## 2021-08-08 ENCOUNTER — Encounter (INDEPENDENT_AMBULATORY_CARE_PROVIDER_SITE_OTHER): Payer: Medicare Other

## 2021-08-09 ENCOUNTER — Other Ambulatory Visit: Payer: Self-pay

## 2021-08-09 ENCOUNTER — Inpatient Hospital Stay
Admission: EM | Admit: 2021-08-09 | Discharge: 2021-08-15 | DRG: 286 | Disposition: A | Discharge status: Home Health | Payer: Medicare Other | Attending: Internal Medicine | Admitting: Internal Medicine

## 2021-08-09 ENCOUNTER — Inpatient Hospital Stay: Payer: Medicare Other

## 2021-08-09 ENCOUNTER — Emergency Department: Payer: Medicare Other

## 2021-08-09 DIAGNOSIS — Z8673 Personal history of transient ischemic attack (TIA), and cerebral infarction without residual deficits: Secondary | ICD-10-CM

## 2021-08-09 DIAGNOSIS — Z794 Long term (current) use of insulin: Secondary | ICD-10-CM

## 2021-08-09 DIAGNOSIS — R7989 Other specified abnormal findings of blood chemistry: Secondary | ICD-10-CM | POA: Diagnosis present

## 2021-08-09 DIAGNOSIS — Z7984 Long term (current) use of oral hypoglycemic drugs: Secondary | ICD-10-CM

## 2021-08-09 DIAGNOSIS — Z888 Allergy status to other drugs, medicaments and biological substances status: Secondary | ICD-10-CM

## 2021-08-09 DIAGNOSIS — E1140 Type 2 diabetes mellitus with diabetic neuropathy, unspecified: Secondary | ICD-10-CM | POA: Diagnosis present

## 2021-08-09 DIAGNOSIS — I214 Non-ST elevation (NSTEMI) myocardial infarction: Secondary | ICD-10-CM

## 2021-08-09 DIAGNOSIS — E1151 Type 2 diabetes mellitus with diabetic peripheral angiopathy without gangrene: Secondary | ICD-10-CM | POA: Diagnosis present

## 2021-08-09 DIAGNOSIS — J432 Centrilobular emphysema: Secondary | ICD-10-CM

## 2021-08-09 DIAGNOSIS — I255 Ischemic cardiomyopathy: Secondary | ICD-10-CM | POA: Diagnosis present

## 2021-08-09 DIAGNOSIS — I200 Unstable angina: Secondary | ICD-10-CM | POA: Diagnosis not present

## 2021-08-09 DIAGNOSIS — R600 Localized edema: Secondary | ICD-10-CM

## 2021-08-09 DIAGNOSIS — J449 Chronic obstructive pulmonary disease, unspecified: Secondary | ICD-10-CM | POA: Diagnosis present

## 2021-08-09 DIAGNOSIS — I340 Nonrheumatic mitral (valve) insufficiency: Secondary | ICD-10-CM | POA: Diagnosis not present

## 2021-08-09 DIAGNOSIS — Z7982 Long term (current) use of aspirin: Secondary | ICD-10-CM

## 2021-08-09 DIAGNOSIS — J9601 Acute respiratory failure with hypoxia: Secondary | ICD-10-CM | POA: Diagnosis present

## 2021-08-09 DIAGNOSIS — G2581 Restless legs syndrome: Secondary | ICD-10-CM | POA: Diagnosis present

## 2021-08-09 DIAGNOSIS — I252 Old myocardial infarction: Secondary | ICD-10-CM | POA: Diagnosis not present

## 2021-08-09 DIAGNOSIS — N179 Acute kidney failure, unspecified: Secondary | ICD-10-CM | POA: Diagnosis not present

## 2021-08-09 DIAGNOSIS — R778 Other specified abnormalities of plasma proteins: Secondary | ICD-10-CM | POA: Diagnosis not present

## 2021-08-09 DIAGNOSIS — Z87891 Personal history of nicotine dependence: Secondary | ICD-10-CM

## 2021-08-09 DIAGNOSIS — I2510 Atherosclerotic heart disease of native coronary artery without angina pectoris: Secondary | ICD-10-CM | POA: Diagnosis present

## 2021-08-09 DIAGNOSIS — E1122 Type 2 diabetes mellitus with diabetic chronic kidney disease: Secondary | ICD-10-CM | POA: Diagnosis present

## 2021-08-09 DIAGNOSIS — I10 Essential (primary) hypertension: Secondary | ICD-10-CM | POA: Diagnosis not present

## 2021-08-09 DIAGNOSIS — I130 Hypertensive heart and chronic kidney disease with heart failure and stage 1 through stage 4 chronic kidney disease, or unspecified chronic kidney disease: Secondary | ICD-10-CM | POA: Diagnosis present

## 2021-08-09 DIAGNOSIS — I959 Hypotension, unspecified: Secondary | ICD-10-CM | POA: Diagnosis present

## 2021-08-09 DIAGNOSIS — Z7902 Long term (current) use of antithrombotics/antiplatelets: Secondary | ICD-10-CM | POA: Diagnosis not present

## 2021-08-09 DIAGNOSIS — Z66 Do not resuscitate: Secondary | ICD-10-CM | POA: Diagnosis present

## 2021-08-09 DIAGNOSIS — I2582 Chronic total occlusion of coronary artery: Secondary | ICD-10-CM | POA: Diagnosis present

## 2021-08-09 DIAGNOSIS — E1143 Type 2 diabetes mellitus with diabetic autonomic (poly)neuropathy: Secondary | ICD-10-CM | POA: Diagnosis present

## 2021-08-09 DIAGNOSIS — E782 Mixed hyperlipidemia: Secondary | ICD-10-CM | POA: Diagnosis not present

## 2021-08-09 DIAGNOSIS — Z20822 Contact with and (suspected) exposure to covid-19: Secondary | ICD-10-CM | POA: Diagnosis present

## 2021-08-09 DIAGNOSIS — I5023 Acute on chronic systolic (congestive) heart failure: Secondary | ICD-10-CM | POA: Diagnosis present

## 2021-08-09 DIAGNOSIS — N1831 Chronic kidney disease, stage 3a: Secondary | ICD-10-CM | POA: Diagnosis not present

## 2021-08-09 DIAGNOSIS — K219 Gastro-esophageal reflux disease without esophagitis: Secondary | ICD-10-CM | POA: Diagnosis present

## 2021-08-09 DIAGNOSIS — Z96651 Presence of right artificial knee joint: Secondary | ICD-10-CM | POA: Diagnosis present

## 2021-08-09 DIAGNOSIS — Z833 Family history of diabetes mellitus: Secondary | ICD-10-CM

## 2021-08-09 DIAGNOSIS — E785 Hyperlipidemia, unspecified: Secondary | ICD-10-CM | POA: Diagnosis present

## 2021-08-09 DIAGNOSIS — I2583 Coronary atherosclerosis due to lipid rich plaque: Secondary | ICD-10-CM | POA: Diagnosis not present

## 2021-08-09 DIAGNOSIS — N400 Enlarged prostate without lower urinary tract symptoms: Secondary | ICD-10-CM | POA: Diagnosis present

## 2021-08-09 DIAGNOSIS — I25118 Atherosclerotic heart disease of native coronary artery with other forms of angina pectoris: Secondary | ICD-10-CM | POA: Diagnosis not present

## 2021-08-09 DIAGNOSIS — I5021 Acute systolic (congestive) heart failure: Secondary | ICD-10-CM | POA: Diagnosis not present

## 2021-08-09 DIAGNOSIS — M1990 Unspecified osteoarthritis, unspecified site: Secondary | ICD-10-CM | POA: Diagnosis present

## 2021-08-09 DIAGNOSIS — Z79899 Other long term (current) drug therapy: Secondary | ICD-10-CM

## 2021-08-09 DIAGNOSIS — Z85828 Personal history of other malignant neoplasm of skin: Secondary | ICD-10-CM

## 2021-08-09 DIAGNOSIS — Z79891 Long term (current) use of opiate analgesic: Secondary | ICD-10-CM

## 2021-08-09 DIAGNOSIS — Z9049 Acquired absence of other specified parts of digestive tract: Secondary | ICD-10-CM

## 2021-08-09 DIAGNOSIS — I248 Other forms of acute ischemic heart disease: Secondary | ICD-10-CM | POA: Diagnosis present

## 2021-08-09 DIAGNOSIS — E1129 Type 2 diabetes mellitus with other diabetic kidney complication: Secondary | ICD-10-CM | POA: Diagnosis present

## 2021-08-09 DIAGNOSIS — I2584 Coronary atherosclerosis due to calcified coronary lesion: Secondary | ICD-10-CM | POA: Diagnosis not present

## 2021-08-09 DIAGNOSIS — N183 Chronic kidney disease, stage 3 unspecified: Secondary | ICD-10-CM | POA: Diagnosis present

## 2021-08-09 HISTORY — DX: Non-ST elevation (NSTEMI) myocardial infarction: I21.4

## 2021-08-09 LAB — CBC WITH DIFFERENTIAL/PLATELET
Abs Immature Granulocytes: 0.05 10*3/uL (ref 0.00–0.07)
Basophils Absolute: 0 10*3/uL (ref 0.0–0.1)
Basophils Relative: 1 %
Eosinophils Absolute: 0.1 10*3/uL (ref 0.0–0.5)
Eosinophils Relative: 1 %
HCT: 41.3 % (ref 39.0–52.0)
Hemoglobin: 13.1 g/dL (ref 13.0–17.0)
Immature Granulocytes: 1 %
Lymphocytes Relative: 18 %
Lymphs Abs: 1.4 10*3/uL (ref 0.7–4.0)
MCH: 25.7 pg — ABNORMAL LOW (ref 26.0–34.0)
MCHC: 31.7 g/dL (ref 30.0–36.0)
MCV: 81 fL (ref 80.0–100.0)
Monocytes Absolute: 1 10*3/uL (ref 0.1–1.0)
Monocytes Relative: 12 %
Neutro Abs: 5.5 10*3/uL (ref 1.7–7.7)
Neutrophils Relative %: 67 %
Platelets: 245 10*3/uL (ref 150–400)
RBC: 5.1 MIL/uL (ref 4.22–5.81)
RDW: 19.2 % — ABNORMAL HIGH (ref 11.5–15.5)
WBC: 8.1 10*3/uL (ref 4.0–10.5)
nRBC: 0 % (ref 0.0–0.2)

## 2021-08-09 LAB — BRAIN NATRIURETIC PEPTIDE: B Natriuretic Peptide: >4500 pg/mL — ABNORMAL HIGH (ref 0.0–100.0)

## 2021-08-09 LAB — RESP PANEL BY RT-PCR (FLU A&B, COVID) ARPGX2
Influenza A by PCR: NEGATIVE
Influenza B by PCR: NEGATIVE
SARS Coronavirus 2 by RT PCR: NEGATIVE

## 2021-08-09 LAB — PROTIME-INR
INR: 1.8 — ABNORMAL HIGH (ref 0.8–1.2)
Prothrombin Time: 20.9 seconds — ABNORMAL HIGH (ref 11.4–15.2)

## 2021-08-09 LAB — TROPONIN I (HIGH SENSITIVITY)
Troponin I (High Sensitivity): 533 ng/L (ref <18)
Troponin I (High Sensitivity): 580 ng/L (ref <18)
Troponin I (High Sensitivity): 816 ng/L (ref <18)
Troponin I (High Sensitivity): 1183 ng/L (ref <18)

## 2021-08-09 LAB — COMPREHENSIVE METABOLIC PANEL
ALT: 59 U/L — ABNORMAL HIGH (ref 0–44)
AST: 118 U/L — ABNORMAL HIGH (ref 15–41)
Albumin: 2.8 g/dL — ABNORMAL LOW (ref 3.5–5.0)
Alkaline Phosphatase: 188 U/L — ABNORMAL HIGH (ref 38–126)
Anion gap: 12 (ref 5–15)
BUN: 26 mg/dL — ABNORMAL HIGH (ref 8–23)
CO2: 26 mmol/L (ref 22–32)
Calcium: 8.7 mg/dL — ABNORMAL LOW (ref 8.9–10.3)
Chloride: 99 mmol/L (ref 98–111)
Creatinine, Ser: 1.31 mg/dL — ABNORMAL HIGH (ref 0.61–1.24)
GFR, Estimated: 53 mL/min — ABNORMAL LOW (ref >60)
Glucose, Bld: 79 mg/dL (ref 70–99)
Potassium: 4.6 mmol/L (ref 3.5–5.1)
Sodium: 137 mmol/L (ref 135–145)
Total Bilirubin: 3.3 mg/dL — ABNORMAL HIGH (ref 0.3–1.2)
Total Protein: 7.3 g/dL (ref 6.5–8.1)

## 2021-08-09 LAB — APTT: aPTT: 37 seconds — ABNORMAL HIGH (ref 24–36)

## 2021-08-09 LAB — HEPARIN LEVEL (UNFRACTIONATED): Heparin Unfractionated: 0.16 IU/mL — ABNORMAL LOW (ref 0.30–0.70)

## 2021-08-09 LAB — CBG MONITORING, ED
Glucose-Capillary: 55 mg/dL — ABNORMAL LOW (ref 70–99)
Glucose-Capillary: 65 mg/dL — ABNORMAL LOW (ref 70–99)
Glucose-Capillary: 81 mg/dL (ref 70–99)
Glucose-Capillary: 123 mg/dL — ABNORMAL HIGH (ref 70–99)

## 2021-08-09 LAB — MAGNESIUM: Magnesium: 1.8 mg/dL (ref 1.7–2.4)

## 2021-08-09 MED ORDER — INSULIN ASPART 100 UNIT/ML IJ SOLN
0.0000 [IU] | Freq: Every day | INTRAMUSCULAR | Status: DC
  Administered 2021-08-10 – 2021-08-14 (×2): 2 [IU] via SUBCUTANEOUS
  Filled 2021-08-09 (×3): qty 1

## 2021-08-09 MED ORDER — INSULIN ASPART PROT & ASPART (70-30 MIX) 100 UNIT/ML ~~LOC~~ SUSP
12.0000 [IU] | Freq: Two times a day (BID) | SUBCUTANEOUS | Status: DC
  Administered 2021-08-10 – 2021-08-11 (×3): 12 [IU] via SUBCUTANEOUS
  Filled 2021-08-09 (×4): qty 10

## 2021-08-09 MED ORDER — PANTOPRAZOLE SODIUM 40 MG PO TBEC
40.0000 mg | DELAYED_RELEASE_TABLET | Freq: Every day | ORAL | Status: DC
  Administered 2021-08-10 – 2021-08-14 (×5): 40 mg via ORAL
  Filled 2021-08-09 (×5): qty 1

## 2021-08-09 MED ORDER — CILOSTAZOL 100 MG PO TABS
100.0000 mg | ORAL_TABLET | Freq: Two times a day (BID) | ORAL | Status: DC
  Administered 2021-08-09 – 2021-08-10 (×2): 100 mg via ORAL
  Filled 2021-08-09 (×4): qty 1

## 2021-08-09 MED ORDER — ALBUTEROL SULFATE (2.5 MG/3ML) 0.083% IN NEBU
3.0000 mL | INHALATION_SOLUTION | RESPIRATORY_TRACT | Status: DC | PRN
  Administered 2021-08-09: 3 mL via RESPIRATORY_TRACT
  Filled 2021-08-09: qty 3

## 2021-08-09 MED ORDER — VITAMIN E 45 MG (100 UNIT) PO CAPS
400.0000 [IU] | ORAL_CAPSULE | Freq: Every day | ORAL | Status: DC
  Administered 2021-08-10 – 2021-08-14 (×5): 400 [IU] via ORAL
  Filled 2021-08-09 (×6): qty 4

## 2021-08-09 MED ORDER — LATANOPROST 0.005 % OP SOLN
2.0000 [drp] | Freq: Every day | OPHTHALMIC | Status: DC
  Administered 2021-08-09 – 2021-08-14 (×4): 2 [drp] via OPHTHALMIC
  Filled 2021-08-09 (×2): qty 2.5

## 2021-08-09 MED ORDER — ASPIRIN 81 MG PO CHEW
324.0000 mg | CHEWABLE_TABLET | Freq: Once | ORAL | Status: AC
  Administered 2021-08-09: 324 mg via ORAL
  Filled 2021-08-09: qty 4

## 2021-08-09 MED ORDER — ONDANSETRON HCL 4 MG/2ML IJ SOLN
4.0000 mg | Freq: Three times a day (TID) | INTRAMUSCULAR | Status: DC | PRN
  Administered 2021-08-12 – 2021-08-13 (×2): 4 mg via INTRAVENOUS
  Filled 2021-08-09 (×2): qty 2

## 2021-08-09 MED ORDER — VITAMIN B-12 1000 MCG PO TABS
500.0000 ug | ORAL_TABLET | Freq: Every day | ORAL | Status: DC
  Administered 2021-08-10 – 2021-08-14 (×5): 500 ug via ORAL
  Filled 2021-08-09 (×6): qty 1

## 2021-08-09 MED ORDER — FUROSEMIDE 10 MG/ML IJ SOLN
80.0000 mg | Freq: Two times a day (BID) | INTRAMUSCULAR | Status: DC
  Administered 2021-08-09 – 2021-08-14 (×10): 80 mg via INTRAVENOUS
  Filled 2021-08-09 (×9): qty 8

## 2021-08-09 MED ORDER — ACETAMINOPHEN 325 MG PO TABS: 650.0000 mg | ORAL_TABLET | Freq: Four times a day (QID) | ORAL | Status: DC | PRN

## 2021-08-09 MED ORDER — TIMOLOL MALEATE 0.5 % OP SOLN
2.0000 [drp] | Freq: Every day | OPHTHALMIC | Status: DC
  Administered 2021-08-10 – 2021-08-14 (×6): 2 [drp] via OPHTHALMIC
  Filled 2021-08-09 (×2): qty 5

## 2021-08-09 MED ORDER — HYDRALAZINE HCL 20 MG/ML IJ SOLN: 5.0000 mg | INTRAMUSCULAR | Status: DC | PRN

## 2021-08-09 MED ORDER — FUROSEMIDE 10 MG/ML IJ SOLN
40.0000 mg | Freq: Once | INTRAMUSCULAR | Status: AC
  Administered 2021-08-09: 40 mg via INTRAVENOUS
  Filled 2021-08-09: qty 4

## 2021-08-09 MED ORDER — GABAPENTIN 400 MG PO CAPS
400.0000 mg | ORAL_CAPSULE | Freq: Two times a day (BID) | ORAL | Status: DC
  Administered 2021-08-09 – 2021-08-14 (×11): 400 mg via ORAL
  Filled 2021-08-09 (×11): qty 1

## 2021-08-09 MED ORDER — ADULT MULTIVITAMIN W/MINERALS CH
1.0000 | ORAL_TABLET | Freq: Every day | ORAL | Status: DC
  Administered 2021-08-10 – 2021-08-14 (×5): 1 via ORAL
  Filled 2021-08-09 (×5): qty 1

## 2021-08-09 MED ORDER — HEPARIN BOLUS VIA INFUSION
2500.0000 [IU] | Freq: Once | INTRAVENOUS | Status: AC
  Administered 2021-08-09: 2500 [IU] via INTRAVENOUS
  Filled 2021-08-09: qty 2500

## 2021-08-09 MED ORDER — INSULIN ASPART 100 UNIT/ML IJ SOLN
0.0000 [IU] | Freq: Three times a day (TID) | INTRAMUSCULAR | Status: DC
  Administered 2021-08-10: 2 [IU] via SUBCUTANEOUS
  Administered 2021-08-11 (×2): 1 [IU] via SUBCUTANEOUS
  Administered 2021-08-11: 2 [IU] via SUBCUTANEOUS
  Administered 2021-08-12: 1 [IU] via SUBCUTANEOUS
  Administered 2021-08-12: 2 [IU] via SUBCUTANEOUS
  Administered 2021-08-12 – 2021-08-13 (×2): 1 [IU] via SUBCUTANEOUS
  Administered 2021-08-14: 2 [IU] via SUBCUTANEOUS
  Filled 2021-08-09 (×9): qty 1

## 2021-08-09 MED ORDER — SILVER SULFADIAZINE 1 % EX CREA
TOPICAL_CREAM | Freq: Every day | CUTANEOUS | Status: DC
  Filled 2021-08-09 (×2): qty 85

## 2021-08-09 MED ORDER — HYDROCODONE-ACETAMINOPHEN 5-325 MG PO TABS
1.0000 | ORAL_TABLET | Freq: Four times a day (QID) | ORAL | Status: DC | PRN
  Administered 2021-08-10 – 2021-08-14 (×13): 1 via ORAL
  Filled 2021-08-09 (×14): qty 1

## 2021-08-09 MED ORDER — VITAMIN D3 25 MCG (1000 UNIT) PO TABS
1000.0000 [IU] | ORAL_TABLET | Freq: Every day | ORAL | Status: DC
  Administered 2021-08-10 – 2021-08-14 (×5): 1000 [IU] via ORAL
  Filled 2021-08-09 (×11): qty 1

## 2021-08-09 MED ORDER — TRAZODONE HCL 50 MG PO TABS
50.0000 mg | ORAL_TABLET | Freq: Every evening | ORAL | Status: DC | PRN
  Administered 2021-08-09 – 2021-08-14 (×4): 50 mg via ORAL
  Filled 2021-08-09 (×5): qty 1

## 2021-08-09 MED ORDER — NITROGLYCERIN 0.4 MG SL SUBL
0.4000 mg | SUBLINGUAL_TABLET | SUBLINGUAL | Status: DC | PRN
  Administered 2021-08-13: 0.4 mg via SUBLINGUAL
  Filled 2021-08-09: qty 1

## 2021-08-09 MED ORDER — CHOLESTYRAMINE 4 G PO PACK
4.0000 g | PACK | Freq: Three times a day (TID) | ORAL | Status: DC
  Administered 2021-08-09 – 2021-08-12 (×10): 4 g via ORAL
  Filled 2021-08-09 (×18): qty 1

## 2021-08-09 MED ORDER — ATORVASTATIN CALCIUM 20 MG PO TABS
40.0000 mg | ORAL_TABLET | Freq: Every day | ORAL | Status: DC
  Administered 2021-08-10 – 2021-08-14 (×5): 40 mg via ORAL
  Filled 2021-08-09 (×5): qty 2

## 2021-08-09 MED ORDER — DM-GUAIFENESIN ER 30-600 MG PO TB12
1.0000 | ORAL_TABLET | Freq: Two times a day (BID) | ORAL | Status: DC | PRN
  Administered 2021-08-09 – 2021-08-12 (×2): 1 via ORAL
  Filled 2021-08-09 (×2): qty 1

## 2021-08-09 MED ORDER — ASPIRIN EC 81 MG PO TBEC
81.0000 mg | DELAYED_RELEASE_TABLET | Freq: Every day | ORAL | Status: DC
  Administered 2021-08-10 – 2021-08-15 (×6): 81 mg via ORAL
  Filled 2021-08-09 (×6): qty 1

## 2021-08-09 MED ORDER — HEPARIN BOLUS VIA INFUSION
4000.0000 [IU] | Freq: Once | INTRAVENOUS | Status: AC
  Administered 2021-08-09: 4000 [IU] via INTRAVENOUS
  Filled 2021-08-09: qty 4000

## 2021-08-09 MED ORDER — TAMSULOSIN HCL 0.4 MG PO CAPS
0.4000 mg | ORAL_CAPSULE | Freq: Every day | ORAL | Status: DC
  Administered 2021-08-10 – 2021-08-14 (×5): 0.4 mg via ORAL
  Filled 2021-08-09 (×5): qty 1

## 2021-08-09 MED ORDER — IPRATROPIUM-ALBUTEROL 0.5-2.5 (3) MG/3ML IN SOLN
3.0000 mL | Freq: Four times a day (QID) | RESPIRATORY_TRACT | Status: DC
  Administered 2021-08-09 – 2021-08-11 (×7): 3 mL via RESPIRATORY_TRACT
  Filled 2021-08-09 (×7): qty 3

## 2021-08-09 MED ORDER — HEPARIN (PORCINE) 25000 UT/250ML-% IV SOLN
1550.0000 [IU]/h | INTRAVENOUS | Status: DC
  Administered 2021-08-09: 1100 [IU]/h via INTRAVENOUS
  Administered 2021-08-11: 1550 [IU]/h via INTRAVENOUS
  Filled 2021-08-09 (×4): qty 250

## 2021-08-09 MED ORDER — METOPROLOL SUCCINATE ER 100 MG PO TB24
100.0000 mg | ORAL_TABLET | Freq: Every day | ORAL | Status: DC
  Administered 2021-08-09 – 2021-08-14 (×6): 100 mg via ORAL
  Filled 2021-08-09: qty 2
  Filled 2021-08-09: qty 1
  Filled 2021-08-09: qty 2
  Filled 2021-08-09 (×3): qty 1

## 2021-08-09 MED ORDER — ASCORBIC ACID 500 MG PO TABS
500.0000 mg | ORAL_TABLET | Freq: Every day | ORAL | Status: DC
  Administered 2021-08-10 – 2021-08-14 (×5): 500 mg via ORAL
  Filled 2021-08-09 (×5): qty 1

## 2021-08-09 NOTE — ED Provider Notes (Signed)
HPI: Pt is a 87 y.o. male who presents with complaints ofSOB   The patient p/w   SOB on 4L new oxygen   ROS: Denies fever,  Past Medical History:  Diagnosis Date   Arthritis    BPH (benign prostatic hyperplasia)    CAD (coronary artery disease)    a. 11/2020 Cath: LM 50d, LAD sev ost dzs, mod to sev prox/mid dzs. D1 sev dzs. LCX mild prox dzs, OM2 mod-sev dzs, RCA 100p. RPL and RPDA fill via L-->R collats. EF 35%. Seen by CT surgery-->Med Rx advised.   Carotid artery stenosis    a. 11/2010 U/S: <50% bilaterally; b. 03/2021 40-59% bilat ICA stenoses.   COPD (chronic obstructive pulmonary disease) ([place])    noted CT 10/05/16 former smoker quit age 40    CVA (cerebral infarction) 11/2010   r thalamic lacunar   Diabetes mellitus    GERD (gastroesophageal reflux disease)    HFrEF (heart failure with reduced ejection fraction) ([place])    a. 10/2020 Echo: EF 35%.   Hyperlipidemia    Hypertension    Ischemic cardiomyopathy    a. 10/2020 Echo: EF 35%. Nl RVSP. Mod MR. Mild TR; b. 11/2020 Cath:  CO/CI 5.05/2.49. LV gram: EF 35%.   Lower extremity cellulitis    Neuromuscular disorder ([place])    Peripheral vascular disease in diabetes mellitus ([place])    a. 06/2012 95% occlusion s/p PTA R SFA (Dr. Lucky Cowboy); b. 03/2021 ABIs: R 1.02, L 0.96.   Stroke [facility])    There were no vitals filed for this visit.  Focused Physical Exam: Gen: No acute distress Head: atraumatic, normocephalic Eyes: Extraocular movements grossly intact; conjunctiva clear CV: RRR Lung: No increased WOB, no stridor GI: ND, no obvious masses Neuro: Alert and awake  Medical Decision Making and Plan: Given the patient's initial medical screening exam, the following diagnostic evaluation has been ordered. The patient will be placed in the appropriate treatment space, once one is available, to complete the evaluation and treatment. I have discussed the plan of care with the patient and I have advised the patient that an ED physician or  mid-level practitioner will reevaluate their condition after the test results have been received, as the results may give them additional insight into the type of treatment they may need.   Diagnostics: labs, xray NEXT BED   Treatments: none immediately   Vanessa Montour, MD 08/09/21 1224

## 2021-08-09 NOTE — Consult Note (Signed)
Cardiology Consult    Patient ID: ADVAITH LAMARQUE MRN: [id number], DOB/AGE: 1933/05/21   Admit date: 08/09/2021 Date of Consult: 08/09/2021  Primary Physician: Baxter Hire, MD Primary Cardiologist: Ida Rogue, MD Requesting Provider: C. Isaacs  Patient Profile    Henry Lindsey is a 87 y.o. male with a history of CAD, ICM, HFrEF, HTN, HL, DMII, GERD, CVA, PAD, and carotid arterial dzs, who is being seen today for the evaluation of acute on chronic HFrEF and demand ischemia at the request of Dr. Ellender Hose.  Past Medical History   Past Medical History:  Diagnosis Date   Arthritis    BPH (benign prostatic hyperplasia)    CAD (coronary artery disease)    a. 11/2020 Cath: LM 50d, LAD sev ost dzs, mod to sev prox/mid dzs. D1 sev dzs. LCX mild prox dzs, OM2 mod-sev dzs, RCA 100p. RPL and RPDA fill via L-->R collats. EF 35%. Seen by CT surgery-->Med Rx advised.   Carotid artery stenosis    a. 11/2010 U/S: <50% bilaterally; b. 03/2021 40-59% bilat ICA stenoses.   COPD (chronic obstructive pulmonary disease) ([place])    noted CT 10/05/16 former smoker quit age 43    CVA (cerebral infarction) 11/2010   r thalamic lacunar   Diabetes mellitus    GERD (gastroesophageal reflux disease)    HFrEF (heart failure with reduced ejection fraction) ([place])    a. 10/2020 Echo: EF 35%.   Hyperlipidemia    Hypertension    Ischemic cardiomyopathy    a. 10/2020 Echo: EF 35%. Nl RVSP. Mod MR. Mild TR; b. 11/2020 Cath:  CO/CI 5.05/2.49. LV gram: EF 35%.   Lower extremity cellulitis    Neuromuscular disorder ([place])    Peripheral vascular disease in diabetes mellitus ([place])    a. 06/2012 95% occlusion s/p PTA R SFA (Dr. Lucky Cowboy); b. 03/2021 ABIs: R 1.02, L 0.96.   Stroke [facility])     Past Surgical History:  Procedure Laterality Date   CHOLECYSTECTOMY N/A 11/13/2016   Procedure: LAPAROSCOPIC CHOLECYSTECTOMY WITH INTRAOPERATIVE CHOLANGIOGRAM;  Surgeon: Olean Ree, MD;  Location: [facility] ORS;  Service: General;   Laterality: N/A;   COLONOSCOPY WITH PROPOFOL N/A 02/10/2018   Procedure: COLONOSCOPY WITH PROPOFOL;  Surgeon: Lucilla Lame, MD;  Location: [facility] ENDOSCOPY;  Service: Endoscopy;  Laterality: N/A;   ERCP N/A 11/17/2016   Procedure: ENDOSCOPIC RETROGRADE CHOLANGIOPANCREATOGRAPHY (ERCP);  Surgeon: Irene Shipper, MD;  Location: [facility] ENDOSCOPY;  Service: Endoscopy;  Laterality: N/A;   ERCP N/A 02/04/2017   Procedure: ENDOSCOPIC RETROGRADE CHOLANGIOPANCREATOGRAPHY (ERCP) Stent removal;  Surgeon: Lucilla Lame, MD;  Location: [facility] ENDOSCOPY;  Service: Endoscopy;  Laterality: N/A;   IR GENERIC HISTORICAL  10/06/2016   IR PERC CHOLECYSTOSTOMY 10/06/2016 Aletta Edouard, MD [facility]-INTERV RAD   JOINT REPLACEMENT Left 2014   left knee   RIGHT/LEFT HEART CATH AND CORONARY ANGIOGRAPHY N/A 11/24/2020   Procedure: RIGHT/LEFT HEART CATH AND CORONARY ANGIOGRAPHY;  Surgeon: Minna Merritts, MD;  Location: [place] CV LAB;  Service: Cardiovascular;  Laterality: N/A;   TOTAL KNEE ARTHROPLASTY Right 08/08/2020   Procedure: TOTAL KNEE ARTHROPLASTY;  Surgeon: Lovell Sheehan, MD;  Location: [facility] ORS;  Service: Orthopedics;  Laterality: Right;   UPPER GASTROINTESTINAL ENDOSCOPY     WRIST SURGERY       Allergies  Allergies  Allergen Reactions   Jardiance [Empagliflozin]     Rash all over and lips turn purple.    Metformin And Related Diarrhea    History of Present Illness  87 y/o ? w/ the above complex PMH including CAD, ICM, HFrEF, HTN, HL, DMII, GERD, CVA, PAD, carotid dzs, and LE cellulitis.  In the setting of resp failure, he was dx w/ bronchitis and PNA in 09/2020.  Echo in 10/2020 showed LV dysfxn w/ EF of 35%, nl RVSP, mod MR, mild TR, and mild pericardial effusion.  He was placed on lasix and referred to cardiology.  Cath in 11/2020 showed severe diffuse LAD, diag, and OM dzs with an occluded RCA.  CO/CI nl.  Ef 35%.  He was evaluated by CT surgery and felt to be a poor surgical candidate  He as been medically  managed since.  In the setting of PAD, he had been on pletal therapy, but this was d/c'd in July due to LV dysfxn/CHF.  He was alst seen in cardiology clinic on 9/30, at which time he was stable w/ chronic woody edema, but stable weight and no significant dyspnea.  He reported that he was still taking pletal and was again advised to d/c.  He noted good activity tolerance w/o chest pain.  Approx 10 days ago, he hurt his right knee.  He saw his PCP and had xrays, which he says were unremarkable.  His leg continued to hurt however, and so his activity dropped off to essentially nothing.  He says that he cont to take his meds as Rx and doesn't think that his salt intake increased any.  Beginning ~ 10/16, he started noting increaseing DOE, orthopnea, and early satiety.  By 10/18, he noted increasing abd girth and lower ext edema.  Though his dry wt is ~ 189 lbs, he hasn't weighed himself in the past 10 days.  He knew that he was becoming volume overloaded and started taking an additional lasix 40 mg daily (usually rx 40 daily w/ extra 20 for wt gain), but to no avail.  Due to worsening dyspnea and volume overload, he called EMS on 10/19. O2 sat 80% on room air on arrival  Improved to 90% on 4lpm.  He was tachycardic on arrival w/ ECG showing sinus tachycardia @ 112 w/ septal infarct, and mild inf ST depression.  Labs notable for BNP >4500.  HsTrop 533.  BUN/Creat mildly elevated above baseline @ 26/1.31.  CXR w/ sm R and trace L pleural effusions. He was given lasix 14m IV, ASA, and heparin. He has not had any chest pain.  Inpatient Medications     [START ON 08/10/2021] ascorbic acid  500 mg Oral Daily   [START ON 08/10/2021] aspirin EC  81 mg Oral Daily   [START ON 08/10/2021] atorvastatin  40 mg Oral Daily   [START ON 08/10/2021] cholecalciferol  1,000 Units Oral Daily   cholestyramine  4 g Oral TID WC   cilostazol  100 mg Oral BID   furosemide  80 mg Intravenous BID   gabapentin  400 mg Oral BID    insulin aspart  0-5 Units Subcutaneous QHS   insulin aspart  0-9 Units Subcutaneous TID WC   latanoprost  2 drop Both Eyes QHS   metoprolol succinate  100 mg Oral Daily   [START ON 08/10/2021] multivitamin with minerals  1 tablet Oral Daily   [START ON 08/10/2021] pantoprazole  40 mg Oral Daily   [START ON 08/10/2021] silver sulfADIAZINE   Topical Daily   [START ON 08/10/2021] tamsulosin  0.4 mg Oral QPC breakfast   [START ON 08/10/2021] timolol  2 drop Both Eyes Daily   [START ON 08/10/2021]  vitamin B-12  500 mcg Oral Daily   [START ON 08/10/2021] vitamin E  400 Units Oral Daily   Home Medications    Prior to Admission medications   Medication Sig Start Date End Date Taking? Authorizing Provider  albuterol (VENTOLIN HFA) 108 (90 Base) MCG/ACT inhaler Inhale 2 puffs into the lungs every 6 (six) hours as needed. 10/29/20  Yes [provider]  ascorbic acid (VITAMIN C) 500 MG tablet Take 500 mg by mouth daily.    Yes [provider]  aspirin EC 81 MG tablet Take 1 tablet (81 mg total) by mouth daily. Swallow whole. 01/31/21  Yes Minna Merritts, MD  atorvastatin (LIPITOR) 40 MG tablet Take 1 tablet (40 mg total) by mouth daily. 12/08/20  Yes Gollan, Kathlene November, MD  cholecalciferol (VITAMIN D) 25 MCG (1000 UNIT) tablet Take 1,000 Units by mouth daily.   Yes [provider]  cholestyramine (QUESTRAN) 4 g packet Take 4 g by mouth 3 (three) times daily with meals.   Yes [provider]  cilostazol (PLETAL) 100 MG tablet Take 100 mg by mouth 2 (two) times daily. 06/01/21  Yes [provider]  diclofenac Sodium (VOLTAREN) 1 % GEL Apply 2 g topically 2 (two) times daily as needed (pain).   Yes [provider]  furosemide (LASIX) 40 MG tablet Take 1 tablet (40 mg total) by mouth daily. May take an additional 1/2 tab for wt gain of 2 lbs in 24 hrs. 07/20/21 07/20/22 Yes Theora Gianotti, NP  gabapentin (NEURONTIN) 400 MG capsule Take 400 mg by  mouth 2 (two) times daily.   Yes [provider]  glipiZIDE (GLUCOTROL) 10 MG tablet Take 10 mg by mouth 2 (two) times daily before a meal.   Yes [provider]  HYDROcodone-acetaminophen (NORCO/VICODIN) 5-325 MG tablet Take 1 tablet by mouth every 6 (six) hours as needed. 10/27/20  Yes [provider]  insulin lispro protamine-lispro (HUMALOG MIX 75/25) (75-25) 100 UNIT/ML SUSP injection Inject into the skin in the morning and at bedtime. 03/27/19  Yes [provider]  insulin NPH-regular Human (70-30) 100 UNIT/ML injection Inject 23 Units into the skin in the morning and at bedtime.    Yes [provider]  latanoprost (XALATAN) 0.005 % ophthalmic solution Place 2 drops into both eyes at bedtime.  11/01/15  Yes [provider]  metoprolol succinate (TOPROL-XL) 100 MG 24 hr tablet Take 1 tablet (100 mg total) by mouth daily. Take with or immediately following a meal. 12/08/20  Yes Gollan, Kathlene November, MD  Multiple Vitamin (MULTIVITAMIN WITH MINERALS) TABS tablet Take 1 tablet by mouth daily.   Yes [provider]  pantoprazole (PROTONIX) 40 MG tablet Take 40 mg by mouth daily.   Yes [provider]  potassium chloride (KLOR-CON) 10 MEQ tablet Take 1 tablet (10 mEq total) by mouth daily. 12/08/20 08/09/21 Yes Gollan, Kathlene November, MD  silver sulfADIAZINE (SILVADENE) 1 % cream Apply topically. 03/22/21  Yes [provider]  tamsulosin (FLOMAX) 0.4 MG CAPS capsule Take 0.4 mg by mouth daily after breakfast.   Yes [provider]  timolol (TIMOPTIC) 0.5 % ophthalmic solution Place 2 drops into both eyes daily.  12/19/17  Yes [provider]  traZODone (DESYREL) 50 MG tablet trazodone 50 mg tablet   Yes [provider]  vitamin B-12 (CYANOCOBALAMIN) 500 MCG tablet Take 500 mcg by mouth daily.   Yes [provider]  vitamin E 180 MG (400 UNITS) capsule  Take 400 Units by mouth daily.   Yes [provider]  Blood Glucose Monitoring Suppl (ONE TOUCH ULTRA SYSTEM KIT) w/Device KIT 1 kit by Does not apply route once. Use DX code E11.59 10/18/15   [name], MD  INS SYRINGE/NEEDLE 1CC/28G (B-D INSULIN SYRINGE 1CC/28G) 28G X 1/2" 1 ML MISC USE TO ADMINISTER INSULIN DAILY 02/13/16   [name], MD  nitroGLYCERIN (NITROSTAT) 0.4 MG SL tablet Place 1 tablet (0.4 mg total) under the tongue every 5 (five) minutes as needed for chest pain. MAXIMUM 3 TABLETS 12/08/20   Minna Merritts, MD  ONE TOUCH ULTRA TEST test strip USE 1 STRIP TO TEST THREE TIMES DAILY AS DIRECTED 08/31/18   [name], MD  [facility] DELICA LANCETS 84O MISC Use three times daily to check blood sugar. 10/18/15   [name], MD     Family History    Family History  Problem Relation Age of Onset   Diabetes Mother    He indicated that his mother is deceased. He indicated that his father is deceased.   Social History    Social History   Socioeconomic History   Marital status: Widowed    Spouse name: Enid Derry, deceased   Number of children: 2   Years of education: 63   Highest education level: Not on file  Occupational History    Employer: retired  Tobacco Use   Smoking status: Former    Packs/day: 0.50    Years: 33.00    Pack years: 16.50    Types: Cigarettes    Quit date: 04/21/1968    Years since quitting: 53.3   Smokeless tobacco: Never  Vaping Use   Vaping Use: Never used  Substance and Sexual Activity   Alcohol use: No   Drug use: No   Sexual activity: Not Currently    Partners: Female    Birth control/protection: None  Other Topics Concern   Not on file  Social History Narrative   Widowed in 2014; dating again    Social Determinants of Health   Financial Resource Strain: Not on file  Food Insecurity: Not on file  Transportation Needs: Not on file  Physical Activity: Not on file  Stress: Not on file  Social Connections: Not on file  Intimate Partner Violence: Not on  file     Review of Systems    General:  No chills, fever, night sweats or weight changes.  Cardiovascular:  No chest pain, +++ dyspnea on exertion, +++ inc abd girth and LE edema, +++ orthopnea, no palpitations, paroxysmal nocturnal dyspnea. Dermatological: No rash, lesions/masses Respiratory: No cough, +++ dyspnea Urologic: No hematuria, dysuria Abdominal:   No nausea, vomiting, diarrhea, bright red blood per rectum, melena, or hematemesis Neurologic:  No visual changes, wkns, changes in mental status. All other systems reviewed and are otherwise negative except as noted above.  Physical Exam    Blood pressure 134/80, pulse 95, temperature 98 F (36.7 C), temperature source Oral, resp. rate 20, height '5\' 11"'  (1.803 m), weight 85.3 kg, SpO2 99 %.  General: Pleasant, NAD Psych: Normal affect. Neuro: Alert and oriented X 3. Moves all extremities spontaneously. HEENT: Normal  Neck: Supple without bruits.  JVD to jaw. Lungs:  Resp regular and unlabored, crackles 1/2 up bilat. Heart: RRR, tachy, distant, no s3, s4, or murmurs. Abdomen: Obese, soft, non-tender, non-distended, BS + x 4.  Extremities: No clubbing, cyanosis.  2+ bilat LE edema to knees. DP/PT diminsihed, Radials 2+  and equal bilaterally.  Labs    Cardiac Enzymes Recent Labs  Lab 08/09/21 1231 08/09/21 1409  TROPONINIHS 533* 580*      Lab Results  Component Value Date   WBC 8.1 08/09/2021   HGB 13.1 08/09/2021   HCT 41.3 08/09/2021   MCV 81.0 08/09/2021   PLT 245 08/09/2021    Recent Labs  Lab 08/09/21 1231  NA 137  K 4.6  CL 99  CO2 26  BUN 26*  CREATININE 1.31*  CALCIUM 8.7*  PROT 7.3  BILITOT 3.3*  ALKPHOS 188*  ALT 59*  AST 118*  GLUCOSE 79   Lab Results  Component Value Date   CHOL 107 12/24/2018   HDL 35.90 (L) 12/24/2018   LDLCALC 38 12/24/2018   TRIG 164.0 (H) 12/24/2018     Radiology Studies    DG Chest 2 View  Result Date: 08/09/2021 CLINICAL DATA:  Shortness of breath.  EXAM: CHEST - 2 VIEW COMPARISON:  Chest x-ray dated December 05, 2020. FINDINGS: Stable cardiomegaly. Normal pulmonary vascularity. Increasing small right and unchanged trace left pleural effusions. Increased atelectasis at the right lung base. No pneumothorax. No acute osseous abnormality. IMPRESSION: 1. Increasing small right and unchanged trace left pleural effusions. Electronically Signed   By: Titus Dubin M.D.   On: 08/09/2021 13:38    ECG & Cardiac Imaging    Sinus tachycardia @ 112 w/ septal infarct, and mild inf ST depression - personally reviewed.  Assessment & Plan    1.  Acute on chronic HFrEF/ICM:  EF of 35% by echo earlier this year w/ known moderate multivessel CAD and occluded RCA by cath in 11/2020.  Was doing well until ~ 10/16 when he noted increasing DOE and subsequently increased abd girth, orthopnea, lower ext edema, and early satiety.  He called EMS yesterday and was found to be hypoxic @ 80%.  ED eval notable for BNP >4500 and HsTrops elev @ 533  580.  He is markedly volume overloaded on exam w/ crackles ~ 1/2 up, JVD to jaw, and 2+ bilat lower ext edema.  He just received lasix 40 IV x 1.  I will order lasix 80 IV bid to start @ 6p.  Suspect he will require several days of diuresis.  Repeat echo as he denies any dietary indiscretions or missing any meds.  Otw cont ? blocker.  He is not on acei/arb/arni/mra 2/2 h/o soft BPs/relative hypotension.  No sglt2i 2/2 CKD III.  2.  Demand ischemia/CAD:  As above, s/p cath in 11/2020  Occluded RCA w/ moderate LM/LAD/D1/OM2 dzs.  Seen by CT surgery w/ rec for med rx.  He has not been having chest pain.  As above  Progressive dyspnea and volume overload over the past 5 days.  In that setting HsTrops elevated @ 533  580.  ECG w/ prior septal infarct and mild inf ST depression.  Doubt ACS.  Suspect demand ischemia in the setting of #1.  Follow HsTrop trend and f/u echo.  Cont home doses of asa, statin, ? blocker.  Cont heparin x 48 hrs.  3.   Essential HTN:  stable on ? blocker.  Follow w/ IV diuresis as he has a tendency to run soft.  4.  HL:  cont statin rx.  5.  DMII:  per IM.  6.  PAD:  has had R knee pain, but not necessarily claudication.  Avoid pletal in setting of CHF (pt has been told this before but has cont taking).  7.  CKD III:  creat up slightly.  Follow w/ IV diuresis.  8.  LFT abnormalities:  suspect 2/2 passive congestion/CHF. Follow.  Signed, Murray Hodgkins, NP 08/09/2021, 3:31 PM  For questions or updates, please contact   Please consult [email or web address] for contact info under Cardiology/STEMI.

## 2021-08-09 NOTE — H&P (Signed)
History and Physical    KAYO ZION NAT:[id number] DOB: 09/28/1933 DOA: 08/09/2021  Referring MD/NP/PA:   PCP: Baxter Hire, MD   Patient coming from:  The patient is coming from home.    Chief Complaint: Shortness breath.  HPI: Henry Lindsey is a 87 y.o. male with medical history significant of sCHF with EF 35%, hypertension, hyperlipidemia, diabetes mellitus, COPD, stroke, GERD, PVD, (s/p for right PTA SFA), CAD, BPH, carotid artery stenosis, CKD 3, diverticulitis, who presents with shortness of breath.  Patient states that he has shortness of breath for almost a week, which has been progressively worsening.  Patient has cough initially with greenish colored sputum production, then with clear mucus production now.  Denies chest pain, fever or chills.  Patient does not have nausea, vomiting, diarrhea or abdominal pain.  No symptoms of UTI.  No recent fall or head injury.  No dark stool or rectal bleeding.  Patient was found to have oxygen desaturation to 88% on room air, which improved to 99% on 4 L oxygen.  Patient is normally not wearing oxygen at home.  ED Course: pt was found to have BNP > 4500, troponin level 533, 580, INR 1.8, PTT 37, negative COVID PCR, renal function close to baseline, abnormal liver function (ALP 180, AST 118, ALT 59, total bilirubin 3.3), temperature normal, blood pressure 134/80, heart rate 104, RR 25, chest x-ray with bilateral small pleural effusion and cardiomegaly.  Patient is admitted to progressive bed as inpatient.  Dr. Rockey Situ of cardiology is consulted.  Review of Systems:   General: no fevers, chills, no body weight gain, has fatigue HEENT: no blurry vision, hearing changes or sore throat Respiratory: has dyspnea, coughing, no wheezing CV: no chest pain, no palpitations GI: no nausea, vomiting, abdominal pain, diarrhea, constipation GU: no dysuria, burning on urination, increased urinary frequency, hematuria  Ext: has leg edema Neuro: no  unilateral weakness, numbness, or tingling, no vision change or hearing loss Skin: no rash, no skin tear. MSK: No muscle spasm, no deformity, no limitation of range of movement in spin Heme: No easy bruising.  Travel history: No recent long distant travel.  Allergy:  Allergies  Allergen Reactions   Jardiance [Empagliflozin]     Rash all over and lips turn purple.    Metformin And Related Diarrhea    Past Medical History:  Diagnosis Date   Arthritis    BPH (benign prostatic hyperplasia)    CAD (coronary artery disease)    a. 11/2020 Cath: LM 50d, LAD sev ost dzs, mod to sev prox/mid dzs. D1 sev dzs. LCX mild prox dzs, OM2 mod-sev dzs, RCA 100p. RPL and RPDA fill via L-->R collats. EF 35%. Seen by CT surgery-->Med Rx advised.   Carotid artery stenosis    a. 11/2010 U/S: <50% bilaterally; b. 03/2021 40-59% bilat ICA stenoses.   COPD (chronic obstructive pulmonary disease) ([place])    noted CT 10/05/16 former smoker quit age 25    CVA (cerebral infarction) 11/2010   r thalamic lacunar   Diabetes mellitus    GERD (gastroesophageal reflux disease)    HFrEF (heart failure with reduced ejection fraction) ([place])    a. 10/2020 Echo: EF 35%.   Hyperlipidemia    Hypertension    Ischemic cardiomyopathy    a. 10/2020 Echo: EF 35%. Nl RVSP. Mod MR. Mild TR; b. 11/2020 Cath:  CO/CI 5.05/2.49. LV gram: EF 35%.   Lower extremity cellulitis    Neuromuscular disorder ([place])    Peripheral  vascular disease in diabetes mellitus ([place])    a. 06/2012 95% occlusion s/p PTA R SFA (Dr. Lucky Cowboy); b. 03/2021 ABIs: R 1.02, L 0.96.   Stroke [facility])     Past Surgical History:  Procedure Laterality Date   CHOLECYSTECTOMY N/A 11/13/2016   Procedure: LAPAROSCOPIC CHOLECYSTECTOMY WITH INTRAOPERATIVE CHOLANGIOGRAM;  Surgeon: Olean Ree, MD;  Location: [facility] ORS;  Service: General;  Laterality: N/A;   COLONOSCOPY WITH PROPOFOL N/A 02/10/2018   Procedure: COLONOSCOPY WITH PROPOFOL;  Surgeon: Lucilla Lame, MD;  Location: [facility]  ENDOSCOPY;  Service: Endoscopy;  Laterality: N/A;   ERCP N/A 11/17/2016   Procedure: ENDOSCOPIC RETROGRADE CHOLANGIOPANCREATOGRAPHY (ERCP);  Surgeon: Irene Shipper, MD;  Location: [facility] ENDOSCOPY;  Service: Endoscopy;  Laterality: N/A;   ERCP N/A 02/04/2017   Procedure: ENDOSCOPIC RETROGRADE CHOLANGIOPANCREATOGRAPHY (ERCP) Stent removal;  Surgeon: Lucilla Lame, MD;  Location: [facility] ENDOSCOPY;  Service: Endoscopy;  Laterality: N/A;   IR GENERIC HISTORICAL  10/06/2016   IR PERC CHOLECYSTOSTOMY 10/06/2016 Aletta Edouard, MD [facility]-INTERV RAD   JOINT REPLACEMENT Left 2014   left knee   RIGHT/LEFT HEART CATH AND CORONARY ANGIOGRAPHY N/A 11/24/2020   Procedure: RIGHT/LEFT HEART CATH AND CORONARY ANGIOGRAPHY;  Surgeon: Minna Merritts, MD;  Location: [place] CV LAB;  Service: Cardiovascular;  Laterality: N/A;   TOTAL KNEE ARTHROPLASTY Right 08/08/2020   Procedure: TOTAL KNEE ARTHROPLASTY;  Surgeon: Lovell Sheehan, MD;  Location: [facility] ORS;  Service: Orthopedics;  Laterality: Right;   UPPER GASTROINTESTINAL ENDOSCOPY     WRIST SURGERY      Social History:  reports that he quit smoking about 53 years ago. His smoking use included cigarettes. He has a 16.50 pack-year smoking history. He has never used smokeless tobacco. He reports that he does not drink alcohol and does not use drugs.  Family History:  Family History  Problem Relation Age of Onset   Diabetes Mother      Prior to Admission medications   Medication Sig Start Date End Date Taking? Authorizing Provider  albuterol (VENTOLIN HFA) 108 (90 Base) MCG/ACT inhaler Inhale 2 puffs into the lungs every 6 (six) hours as needed. 10/29/20  Yes [provider]  ascorbic acid (VITAMIN C) 500 MG tablet Take 500 mg by mouth daily.    Yes [provider]  aspirin EC 81 MG tablet Take 1 tablet (81 mg total) by mouth daily. Swallow whole. 01/31/21  Yes Minna Merritts, MD  atorvastatin (LIPITOR) 40 MG tablet Take 1 tablet (40 mg total) by  mouth daily. 12/08/20  Yes Gollan, Kathlene November, MD  cholecalciferol (VITAMIN D) 25 MCG (1000 UNIT) tablet Take 1,000 Units by mouth daily.   Yes [provider]  cholestyramine (QUESTRAN) 4 g packet Take 4 g by mouth 3 (three) times daily with meals.   Yes [provider]  cilostazol (PLETAL) 100 MG tablet Take 100 mg by mouth 2 (two) times daily. 06/01/21  Yes [provider]  diclofenac Sodium (VOLTAREN) 1 % GEL Apply 2 g topically 2 (two) times daily as needed (pain).   Yes [provider]  furosemide (LASIX) 40 MG tablet Take 1 tablet (40 mg total) by mouth daily. May take an additional 1/2 tab for wt gain of 2 lbs in 24 hrs. 07/20/21 07/20/22 Yes Theora Gianotti, NP  gabapentin (NEURONTIN) 400 MG capsule Take 400 mg by mouth 2 (two) times daily.   Yes [provider]  glipiZIDE (GLUCOTROL) 10 MG tablet Take 10 mg by mouth 2 (two) times  daily before a meal.   Yes [provider]  HYDROcodone-acetaminophen (NORCO/VICODIN) 5-325 MG tablet Take 1 tablet by mouth every 6 (six) hours as needed. 10/27/20  Yes [provider]  insulin lispro protamine-lispro (HUMALOG MIX 75/25) (75-25) 100 UNIT/ML SUSP injection Inject into the skin in the morning and at bedtime. 03/27/19  Yes [provider]  insulin NPH-regular Human (70-30) 100 UNIT/ML injection Inject 23 Units into the skin in the morning and at bedtime.    Yes [provider]  latanoprost (XALATAN) 0.005 % ophthalmic solution Place 2 drops into both eyes at bedtime.  11/01/15  Yes [provider]  metoprolol succinate (TOPROL-XL) 100 MG 24 hr tablet Take 1 tablet (100 mg total) by mouth daily. Take with or immediately following a meal. 12/08/20  Yes Gollan, Kathlene November, MD  Multiple Vitamin (MULTIVITAMIN WITH MINERALS) TABS tablet Take 1 tablet by mouth daily.   Yes [provider]  pantoprazole (PROTONIX) 40 MG tablet Take 40 mg by mouth daily.   Yes  [provider]  potassium chloride (KLOR-CON) 10 MEQ tablet Take 1 tablet (10 mEq total) by mouth daily. 12/08/20 08/09/21 Yes Gollan, Kathlene November, MD  silver sulfADIAZINE (SILVADENE) 1 % cream Apply topically. 03/22/21  Yes [provider]  tamsulosin (FLOMAX) 0.4 MG CAPS capsule Take 0.4 mg by mouth daily after breakfast.   Yes [provider]  timolol (TIMOPTIC) 0.5 % ophthalmic solution Place 2 drops into both eyes daily.  12/19/17  Yes [provider]  traZODone (DESYREL) 50 MG tablet trazodone 50 mg tablet   Yes [provider]  vitamin B-12 (CYANOCOBALAMIN) 500 MCG tablet Take 500 mcg by mouth daily.   Yes [provider]  vitamin E 180 MG (400 UNITS) capsule Take 400 Units by mouth daily.   Yes [provider]  Blood Glucose Monitoring Suppl (ONE TOUCH ULTRA SYSTEM KIT) w/Device KIT 1 kit by Does not apply route once. Use DX code E11.59 10/18/15   [name], MD  INS SYRINGE/NEEDLE 1CC/28G (B-D INSULIN SYRINGE 1CC/28G) 28G X 1/2" 1 ML MISC USE TO ADMINISTER INSULIN DAILY 02/13/16   [name], MD  nitroGLYCERIN (NITROSTAT) 0.4 MG SL tablet Place 1 tablet (0.4 mg total) under the tongue every 5 (five) minutes as needed for chest pain. MAXIMUM 3 TABLETS 12/08/20   Minna Merritts, MD  ONE TOUCH ULTRA TEST test strip USE 1 STRIP TO TEST THREE TIMES DAILY AS DIRECTED 08/31/18   [name], MD  [facility] DELICA LANCETS 81W MISC Use three times daily to check blood sugar. 10/18/15   [name], MD    Physical Exam: Vitals:   08/09/21 1328 08/09/21 1400 08/09/21 1430 08/09/21 1500  BP: 118/87 123/77 128/69 134/80  Pulse: (!) 104 (!) 104 74 95  Resp: (!) '25 20 20 20  ' Temp:      TempSrc:      SpO2: 100% 100% 94% 99%  Weight:      Height:       General: Not in acute distress HEENT:       Eyes: PERRL, EOMI, no scleral icterus.       ENT: No discharge from the ears and nose, no pharynx injection, no tonsillar  enlargement.        Neck: positive JVD, no bruit, no mass felt. Heme: No neck lymph node enlargement. Cardiac: S1/S2, RRR, No murmurs, No gallops or rubs. Respiratory: Diffuse crackles bilaterally GI: Soft, nondistended, nontender, no  rebound pain, no organomegaly, BS present. GU: No hematuria Ext: 1+ pitting leg edema bilaterally, with chronic erythema and venous insufficiency change bilaterally. The right leg is slightly more swollen than the left. 1+DP/PT pulse bilaterally. Musculoskeletal: No joint deformities, No joint redness or warmth, no limitation of ROM in spin. Skin: No rashes.  Neuro: Alert, oriented X3, cranial nerves II-XII grossly intact, moves all extremities normally.  Psych: Patient is not psychotic, no suicidal or hemocidal ideation.  Labs on Admission: I have personally reviewed following labs and imaging studies  CBC: Recent Labs  Lab 08/09/21 1231  WBC 8.1  NEUTROABS 5.5  HGB 13.1  HCT 41.3  MCV 81.0  PLT 480   Basic Metabolic Panel: Recent Labs  Lab 08/09/21 1231  NA 137  K 4.6  CL 99  CO2 26  GLUCOSE 79  BUN 26*  CREATININE 1.31*  CALCIUM 8.7*  MG 1.8   GFR: Estimated Creatinine Clearance: 43.1 mL/min (A) (by C-G formula based on SCr of 1.31 mg/dL (H)). Liver Function Tests: Recent Labs  Lab 08/09/21 1231  AST 118*  ALT 59*  ALKPHOS 188*  BILITOT 3.3*  PROT 7.3  ALBUMIN 2.8*   No results for input(s): LIPASE, AMYLASE in the last 168 hours. No results for input(s): AMMONIA in the last 168 hours. Coagulation Profile: Recent Labs  Lab 08/09/21 1409  INR 1.8*   Cardiac Enzymes: No results for input(s): CKTOTAL, CKMB, CKMBINDEX, TROPONINI in the last 168 hours. BNP (last 3 results) No results for input(s): PROBNP in the last 8760 hours. HbA1C: No results for input(s): HGBA1C in the last 72 hours. CBG: No results for input(s): GLUCAP in the last 168 hours. Lipid Profile: No results for input(s): CHOL, HDL, LDLCALC, TRIG, CHOLHDL,  LDLDIRECT in the last 72 hours. Thyroid Function Tests: No results for input(s): TSH, T4TOTAL, FREET4, T3FREE, THYROIDAB in the last 72 hours. Anemia Panel: No results for input(s): VITAMINB12, FOLATE, FERRITIN, TIBC, IRON, RETICCTPCT in the last 72 hours. Urine analysis:    Component Value Date/Time   COLORURINE YELLOW (A) 08/03/2020 1153   APPEARANCEUR CLEAR (A) 08/03/2020 1153   APPEARANCEUR Clear 08/17/2012 1505   LABSPEC 1.013 08/03/2020 1153   LABSPEC 1.017 08/17/2012 1505   PHURINE 5.0 08/03/2020 1153   GLUCOSEU NEGATIVE 08/03/2020 1153   GLUCOSEU 50 mg/dL 08/17/2012 1505   HGBUR SMALL (A) 08/03/2020 1153   BILIRUBINUR NEGATIVE 08/03/2020 1153   BILIRUBINUR negative 08/21/2017 1112   BILIRUBINUR Negative 08/17/2012 1505   KETONESUR NEGATIVE 08/03/2020 1153   PROTEINUR NEGATIVE 08/03/2020 1153   UROBILINOGEN 0.2 08/21/2017 1112   NITRITE NEGATIVE 08/03/2020 1153   LEUKOCYTESUR NEGATIVE 08/03/2020 1153   LEUKOCYTESUR Negative 08/17/2012 1505   Sepsis Labs: '@LABRCNTIP' (procalcitonin:4,lacticidven:4) ) Recent Results (from the past 240 hour(s))  Resp Panel by RT-PCR (Flu A&B, Covid) Nasopharyngeal Swab     Status: None   Collection Time: 08/09/21 12:31 PM   Specimen: Nasopharyngeal Swab; Nasopharyngeal(NP) swabs in vial transport medium  Result Value Ref Range Status   SARS Coronavirus 2 by RT PCR NEGATIVE NEGATIVE Final    Comment: (NOTE) SARS-CoV-2 target nucleic acids are NOT DETECTED.  The SARS-CoV-2 RNA is generally detectable in upper respiratory specimens during the acute phase of infection. The lowest concentration of SARS-CoV-2 viral copies this assay can detect is 138 copies/mL. A negative result does not preclude SARS-Cov-2 infection and should not be used as the sole basis for treatment or other patient management decisions. A negative result may occur with  improper  specimen collection/handling, submission of specimen other than nasopharyngeal swab,  presence of viral mutation(s) within the areas targeted by this assay, and inadequate number of viral copies(<138 copies/mL). A negative result must be combined with clinical observations, patient history, and epidemiological information. The expected result is Negative.  Fact Sheet for Patients:  EntrepreneurPulse.com.au  Fact Sheet for Healthcare Providers:  IncredibleEmployment.be  This test is no t yet approved or cleared by the [place] FDA and  has been authorized for detection and/or diagnosis of SARS-CoV-2 by FDA under an Emergency Use Authorization (EUA). This EUA will remain  in effect (meaning this test can be used) for the duration of the COVID-19 declaration under Section 564(b)(1) of the Act, 21 U.S.C.section 360bbb-3(b)(1), unless the authorization is terminated  or revoked sooner.       Influenza A by PCR NEGATIVE NEGATIVE Final   Influenza B by PCR NEGATIVE NEGATIVE Final    Comment: (NOTE) The Xpert Xpress SARS-CoV-2/FLU/RSV plus assay is intended as an aid in the diagnosis of influenza from Nasopharyngeal swab specimens and should not be used as a sole basis for treatment. Nasal washings and aspirates are unacceptable for Xpert Xpress SARS-CoV-2/FLU/RSV testing.  Fact Sheet for Patients: EntrepreneurPulse.com.au  Fact Sheet for Healthcare Providers: IncredibleEmployment.be  This test is not yet approved or cleared by the [place] FDA and has been authorized for detection and/or diagnosis of SARS-CoV-2 by FDA under an Emergency Use Authorization (EUA). This EUA will remain in effect (meaning this test can be used) for the duration of the COVID-19 declaration under Section 564(b)(1) of the Act, 21 U.S.C. section 360bbb-3(b)(1), unless the authorization is terminated or revoked.  Performed at [facility], [address], [place], [place] [address]       Radiological Exams on Admission: DG Chest 2 View  Result Date: 08/09/2021 CLINICAL DATA:  Shortness of breath. EXAM: CHEST - 2 VIEW COMPARISON:  Chest x-ray dated December 05, 2020. FINDINGS: Stable cardiomegaly. Normal pulmonary vascularity. Increasing small right and unchanged trace left pleural effusions. Increased atelectasis at the right lung base. No pneumothorax. No acute osseous abnormality. IMPRESSION: 1. Increasing small right and unchanged trace left pleural effusions. Electronically Signed   By: Titus Dubin M.D.   On: 08/09/2021 13:38     EKG: I have personally reviewed.  Sinus rhythm, QTC 494, RAD, PAC, poor R wave progression, low voltage, ST depression in V5-V6 and the inferior leads.  Assessment/Plan Principal Problem:   Acute on chronic systolic CHF (congestive heart failure) (HCC) Active Problems:   Hyperlipidemia   Hypertension   History of CVA (cerebrovascular accident)   Peripheral vascular disease in diabetes mellitus (HCC)   CAD (coronary artery disease)   BPH (benign prostatic hyperplasia)   Type II diabetes mellitus with renal manifestations (HCC)   COPD (chronic obstructive pulmonary disease) (HCC)   CKD (chronic kidney disease), stage IIIa   Acute respiratory failure with hypoxia (HCC)   Elevated troponin   Abnormal LFTs  Acute respiratory failure with hypoxia due to acute on chronic systolic CHF: 2D echo 0/92/9574 showed EF of 35%.  Patient has leg edema, positive JVD, elevated BNP > 4500, crackles on auscultation, clinically consistent with CHF exacerbation.  Dr. Rockey Situ of cardiology is consulted due to elevated troponin  -Will admit to progressive unit as inpatient -Lasix 80 mg bid per card (patient received 40 mg of Lasix in ED) -2d echo -Daily weights -strict I/O's -Low salt diet -Fluid restriction -Obtain REDs Vest reading -f/u LE doppler  to r/o DVT due to asymmetric leg edema  Elevated troponin and hx of CAD: trop 533 --> 580.  No chest  pain, likely due to demand ischemia, but non-STEMI is also possible.  Dr. Rockey Situ of cardiology is consulted. -IV heparin started in ED, will continue -Aspirin, Lipitor -As needed nitroglycerin -Trend troponin -Check A1c, FLP  Hyperlipidemia -Lipitor  Hypertension -IV hydralazine as needed -Metoprolol  History of CVA (cerebrovascular accident) -Aspirin, Lipitor  Peripheral vascular disease in diabetes mellitus (HCC) -Cilostazol  BPH (benign prostatic hyperplasia) -Flomax  Type II diabetes mellitus with renal manifestations (HCC) -Sliding scale insulin -Decrease 70/30 insulin from 21 to 12 unitsbid  COPD (chronic obstructive pulmonary disease) ([place]): No wheezing or rhonchi. -Bronchodilators  CKD (chronic kidney disease), stage IIIa: Stable -Follow-up with BMP  Abnormal LFTs: Likely due to congestion secondary to CHF exacerbation -Avoid using Tylenol -Check hepatitis panel         DVT ppx: on IV  Heparin      Code Status: DNR (I discussed with patient and explained the meaning of [place]. Patient is very sure that he wants to be DNR). I have tried to call her daughter for  confirming this decision, but unfortunately I could not reach her daughter. Family Communication: I have tried to call her daughter using 2 numbers listed in [place], without success. Disposition Plan:  Anticipate discharge back to previous environment Consults called:  Dr. Rockey Situ of card Admission status and Level of care: Progressive Cardiac:    as inpt        Status is: Inpatient  Remains inpatient appropriate because: Patient has multiple comorbidities, now presents with acute on chronic systolic CHF exacerbation.  Patient has new oxygen requirement.  BNP > 4500.  Patient also has elevated troponin and possible non-STEMI.  His presentation is highly complicated.  Will need to be treated in hospital for at least 2 days.         Date of Service 08/09/2021    [facility]   If 7PM-7AM, please contact night-coverage [email or web address] 08/09/2021, 4:11 PM

## 2021-08-09 NOTE — ED Triage Notes (Signed)
Pt here via [facility] with breathing difficulty. Pt does not normally wear ocygen and was in the 80s on EMS arrival. Pt placed on 4L with sats  at 90%. Pt's oxygen drops lower when exerting himself. Pt c/o right knee pain. Pt has hx of htn, chf, and afib.

## 2021-08-09 NOTE — Consult Note (Signed)
ANTICOAGULATION CONSULT NOTE  Pharmacy Consult for heparin  Indication: chest pain/ACS   Patient Measurements: Height: 5\' 11"  (180.3 cm) Weight: 85.3 kg (188 lb) IBW/kg (Calculated) : 75.3  Vital Signs: Temp: 98 F (36.7 C) (10/20 1228) Temp Source: Oral (10/20 1228) BP: 111/71 (10/20 2200) Pulse Rate: 86 (10/20 2200)  Labs: Recent Labs    08/09/21 1231 08/09/21 1409 08/09/21 1658 08/09/21 2151  HGB 13.1  --   --   --   HCT 41.3  --   --   --   PLT 245  --   --   --   APTT  --  37*  --   --   LABPROT  --  20.9*  --   --   INR  --  1.8*  --   --   HEPARINUNFRC  --   --   --  0.16*  CREATININE 1.31*  --   --   --   TROPONINIHS 533* 580* 816* 1,183*     Estimated Creatinine Clearance: 43.1 mL/min (A) (by C-G formula based on SCr of 1.31 mg/dL (H)).   Medical History: Past Medical History:  Diagnosis Date   Arthritis    BPH (benign prostatic hyperplasia)    CAD (coronary artery disease)    a. 11/2020 Cath: LM 50d, LAD sev ost dzs, mod to sev prox/mid dzs. D1 sev dzs. LCX mild prox dzs, OM2 mod-sev dzs, RCA 100p. RPL and RPDA fill via L-->R collats. EF 35%. Seen by CT surgery-->Med Rx advised.   Carotid artery stenosis    a. 11/2010 U/S: <50% bilaterally; b. 03/2021 40-59% bilat ICA stenoses.   COPD (chronic obstructive pulmonary disease) ([place])    noted CT 10/05/16 former smoker quit age 63    CVA (cerebral infarction) 11/2010   r thalamic lacunar   Diabetes mellitus    GERD (gastroesophageal reflux disease)    HFrEF (heart failure with reduced ejection fraction) ([place])    a. 10/2020 Echo: EF 35%.   Hyperlipidemia    Hypertension    Ischemic cardiomyopathy    a. 10/2020 Echo: EF 35%. Nl RVSP. Mod MR. Mild TR; b. 11/2020 Cath:  CO/CI 5.05/2.49. LV gram: EF 35%.   Lower extremity cellulitis    Neuromuscular disorder ([place])    Peripheral vascular disease in diabetes mellitus ([place])    a. 06/2012 95% occlusion s/p PTA R SFA (Dr. Lucky Cowboy); b. 03/2021 ABIs: R 1.02, L 0.96.    Stroke ([employer])     Medications:  Scheduled:   [START ON 08/10/2021] ascorbic acid  500 mg Oral Daily   [START ON 08/10/2021] aspirin EC  81 mg Oral Daily   [START ON 08/10/2021] atorvastatin  40 mg Oral Daily   [START ON 08/10/2021] cholecalciferol  1,000 Units Oral Daily   cholestyramine  4 g Oral TID WC   cilostazol  100 mg Oral BID   furosemide  80 mg Intravenous BID   gabapentin  400 mg Oral BID   insulin aspart  0-5 Units Subcutaneous QHS   insulin aspart  0-9 Units Subcutaneous TID WC   insulin aspart protamine- aspart  12 Units Subcutaneous BID WC   ipratropium-albuterol  3 mL Nebulization Q6H   latanoprost  2 drop Both Eyes QHS   metoprolol succinate  100 mg Oral Daily   [START ON 08/10/2021] multivitamin with minerals  1 tablet Oral Daily   [START ON 08/10/2021] pantoprazole  40 mg Oral Daily   [START ON 08/10/2021] silver sulfADIAZINE  Topical Daily   [START ON 08/10/2021] tamsulosin  0.4 mg Oral QPC breakfast   [START ON 08/10/2021] timolol  2 drop Both Eyes Daily   [START ON 08/10/2021] vitamin B-12  500 mcg Oral Daily   [START ON 08/10/2021] vitamin E  400 Units Oral Daily    Assessment: 87yo M with PMH of CAD, COPD, CVA, DM, GERD, HFrEF, HLD, HTN, PVD who was admitted to the hospital for respiratory distress. Pt was found to have elevated troponin. Pharmacy was consulted for heparin dosing.   Baseline CBC was within normal limits   No PTA anticoagulants noted on pt chart   Goal of Therapy:  Heparin level 0.3-0.7 units/ml Monitor platelets by anticoagulation protocol: Yes   Plan:  Heparin Level subtherapeutic:  Give 2500 units bolus x 1, then increase heparin infusion rate to 1400 units/hr Check HL level in 8 hours after rate change Continue to monitor H&H and platelets with daily CBC while on heparin gtt   Dallie Piles, PharmD 08/09/2021 10:34 PM

## 2021-08-09 NOTE — ED Provider Notes (Signed)
[facility] Emergency Department Provider Note  ____________________________________________   Event Date/Time   First MD Initiated Contact with Patient 08/09/21 1308     (approximate)  I have reviewed the triage vital signs and the nursing notes.   HISTORY  Chief Complaint Respiratory Distress    HPI Henry Lindsey is a 87 y.o. male with past medical history of CAD, COPD, heart failure with reduced EF, hypertension, hyperlipidemia, here with cough and shortness of breath.  Patient states that for the last 4 days, she had progressive worsening shortness of breath.  He has noticed it initially with exertion but now at rest.  He has had dyspnea when he lays flat.  He said generalized weakness.  Denies any chest pain.  No syncope.  He states he has had some increase in his leg swelling as well that has been "weeping."  Denies any fevers.  He does note he has been producing some mostly clear but occasionally yellow-green sputum.  No hemoptysis.  No other complaints.  He has been taking his medications as prescribed.  Denies any significant dietary changes or increased fluid intake.    Past Medical History:  Diagnosis Date   Arthritis    BPH (benign prostatic hyperplasia)    CAD (coronary artery disease)    a. 11/2020 Cath: LM 50d, LAD sev ost dzs, mod to sev prox/mid dzs. D1 sev dzs. LCX mild prox dzs, OM2 mod-sev dzs, RCA 100p. RPL and RPDA fill via L-->R collats. EF 35%. Seen by CT surgery-->Med Rx advised.   Carotid artery stenosis    a. 11/2010 U/S: <50% bilaterally; b. 03/2021 40-59% bilat ICA stenoses.   COPD (chronic obstructive pulmonary disease) ([place])    noted CT 10/05/16 former smoker quit age 89    CVA (cerebral infarction) 11/2010   r thalamic lacunar   Diabetes mellitus    GERD (gastroesophageal reflux disease)    HFrEF (heart failure with reduced ejection fraction) ([place])    a. 10/2020 Echo: EF 35%.   Hyperlipidemia    Hypertension    Ischemic  cardiomyopathy    a. 10/2020 Echo: EF 35%. Nl RVSP. Mod MR. Mild TR; b. 11/2020 Cath:  CO/CI 5.05/2.49. LV gram: EF 35%.   Lower extremity cellulitis    Neuromuscular disorder ([place])    Peripheral vascular disease in diabetes mellitus ([place])    a. 06/2012 95% occlusion s/p PTA R SFA (Dr. Lucky Cowboy); b. 03/2021 ABIs: R 1.02, L 0.96.   Stroke [facility])     Patient Active Problem List   Diagnosis Date Noted   Acute on chronic systolic CHF (congestive heart failure) ([place]) 08/09/2021   Type II diabetes mellitus with renal manifestations ([place]) 08/09/2021   COPD (chronic obstructive pulmonary disease) (HCC)    CKD (chronic kidney disease), stage IIIa    Acute respiratory failure with hypoxia (H[facility])    Elevated troponin    Abnormal LFTs    Swelling of limb 03/27/2021   Ischemic cardiomyopathy 11/24/2020   Angina pectoris ([place]) 11/24/2020   Acute on chronic combined systolic and diastolic CHF (congestive heart failure) ([place]) 11/24/2020   Postoperative visit 08/22/2020   S/P TKR (total knee replacement) using cement, right 08/08/2020   Open wound of lower leg 05/29/2020   Plantar fasciitis 03/22/2020   Lumbar radiculopathy 07/05/2019   Chronic obstructive pulmonary disease ([place]) 06/11/2019   Gastroesophageal reflux disease without esophagitis 06/11/2019   Neuropathy 06/11/2019   Recurrent productive cough 03/27/2019   Chronic diarrhea  Benign neoplasm of transverse colon    Chronic iron deficiency anemia 10/09/2017   History of diverticulitis 10/05/2017   S/P laparoscopic cholecystectomy 12/05/2016   Bile duct leak    Chest pain 10/05/2016   Pain in joint, ankle and foot 02/25/2015   Diabetic neuropathy ([place]) 10/09/2014   BPH with obstruction/lower urinary tract symptoms 10/09/2014   Bursitis of hip, right 04/05/2014   Glaucoma 04/05/2014   BPH (benign prostatic hyperplasia) 04/05/2014   Peripheral autonomic neuropathy due to DM ([place]) 12/28/2013   Vitamin D deficiency 11/03/2013   Incomplete  emptying of bladder 08/04/2013   Benign localized hyperplasia of prostate with urinary obstruction and other lower urinary tract symptoms (LUTS)(600.21) 06/24/2013   Nocturia 06/24/2013   Orchalgia 06/24/2013   Impotence due to erectile dysfunction 06/01/2013   Routine general medical examination at a health care facility 04/04/2013   CAD (coronary artery disease) 07/21/2012   Peripheral vascular disease in diabetes mellitus ([place])    Carotid artery stenosis with cerebral infarction ([place])    History of basal cell carcinoma excision 08/05/2011   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/05/2011   Uncontrolled type 2 diabetes mellitus with diabetic polyneuropathy, with long-term current use of insulin 08/05/2011   Stroke ([place]) 08/05/2011   Hyperlipidemia    Hypertension    History of CVA (cerebrovascular accident)     Past Surgical History:  Procedure Laterality Date   CHOLECYSTECTOMY N/A 11/13/2016   Procedure: LAPAROSCOPIC CHOLECYSTECTOMY WITH INTRAOPERATIVE CHOLANGIOGRAM;  Surgeon: Olean Ree, MD;  Location: [facility] ORS;  Service: General;  Laterality: N/A;   COLONOSCOPY WITH PROPOFOL N/A 02/10/2018   Procedure: COLONOSCOPY WITH PROPOFOL;  Surgeon: Lucilla Lame, MD;  Location: [facility] ENDOSCOPY;  Service: Endoscopy;  Laterality: N/A;   ERCP N/A 11/17/2016   Procedure: ENDOSCOPIC RETROGRADE CHOLANGIOPANCREATOGRAPHY (ERCP);  Surgeon: Irene Shipper, MD;  Location: [facility] ENDOSCOPY;  Service: Endoscopy;  Laterality: N/A;   ERCP N/A 02/04/2017   Procedure: ENDOSCOPIC RETROGRADE CHOLANGIOPANCREATOGRAPHY (ERCP) Stent removal;  Surgeon: Lucilla Lame, MD;  Location: [facility] ENDOSCOPY;  Service: Endoscopy;  Laterality: N/A;   IR GENERIC HISTORICAL  10/06/2016   IR PERC CHOLECYSTOSTOMY 10/06/2016 Aletta Edouard, MD [facility]-INTERV RAD   JOINT REPLACEMENT Left 2014   left knee   RIGHT/LEFT HEART CATH AND CORONARY ANGIOGRAPHY N/A 11/24/2020   Procedure: RIGHT/LEFT HEART CATH AND CORONARY ANGIOGRAPHY;  Surgeon: Minna Merritts, MD;  Location:  [facility];  Service: Cardiovascular;  Laterality: N/A;   TOTAL KNEE ARTHROPLASTY Right 08/08/2020   Procedure: TOTAL KNEE ARTHROPLASTY;  Surgeon: Lovell Sheehan, MD;  Location: [facility] ORS;  Service: Orthopedics;  Laterality: Right;   UPPER GASTROINTESTINAL ENDOSCOPY     WRIST SURGERY      Prior to Admission medications   Medication Sig Start Date End Date Taking? Authorizing Provider  albuterol (VENTOLIN HFA) 108 (90 Base) MCG/ACT inhaler Inhale 2 puffs into the lungs every 6 (six) hours as needed. 10/29/20  Yes [provider]  ascorbic acid (VITAMIN C) 500 MG tablet Take 500 mg by mouth daily.    Yes [provider]  aspirin EC 81 MG tablet Take 1 tablet (81 mg total) by mouth daily. Swallow whole. 01/31/21  Yes Minna Merritts, MD  atorvastatin (LIPITOR) 40 MG tablet Take 1 tablet (40 mg total) by mouth daily. 12/08/20  Yes Gollan, Kathlene November, MD  cholecalciferol (VITAMIN D) 25 MCG (1000 UNIT) tablet Take 1,000 Units by mouth daily.   Yes [provider]  cholestyramine (QUESTRAN) 4 g packet Take 4  g by mouth 3 (three) times daily with meals.   Yes [provider]  cilostazol (PLETAL) 100 MG tablet Take 100 mg by mouth 2 (two) times daily. 06/01/21  Yes [provider]  diclofenac Sodium (VOLTAREN) 1 % GEL Apply 2 g topically 2 (two) times daily as needed (pain).   Yes [provider]  furosemide (LASIX) 40 MG tablet Take 1 tablet (40 mg total) by mouth daily. May take an additional 1/2 tab for wt gain of 2 lbs in 24 hrs. 07/20/21 07/20/22 Yes Theora Gianotti, NP  gabapentin (NEURONTIN) 400 MG capsule Take 400 mg by mouth 2 (two) times daily.   Yes [provider]  glipiZIDE (GLUCOTROL) 10 MG tablet Take 10 mg by mouth 2 (two) times daily before a meal.   Yes [provider]  HYDROcodone-acetaminophen (NORCO/VICODIN) 5-325 MG tablet Take 1 tablet by mouth every 6 (six) hours as needed. 10/27/20  Yes [provider]  insulin lispro protamine-lispro (HUMALOG MIX 75/25) (75-25) 100 UNIT/ML SUSP injection Inject into the skin in the morning and at bedtime. 03/27/19  Yes [provider]  insulin NPH-regular Human (70-30) 100 UNIT/ML injection Inject 23 Units into the skin in the morning and at bedtime.    Yes [provider]  latanoprost (XALATAN) 0.005 % ophthalmic solution Place 2 drops into both eyes at bedtime.  11/01/15  Yes [provider]  metoprolol succinate (TOPROL-XL) 100 MG 24 hr tablet Take 1 tablet (100 mg total) by mouth daily. Take with or immediately following a meal. 12/08/20  Yes Gollan, Kathlene November, MD  Multiple Vitamin (MULTIVITAMIN WITH MINERALS) TABS tablet Take 1 tablet by mouth daily.   Yes [provider]  pantoprazole (PROTONIX) 40 MG tablet Take 40 mg by mouth daily.   Yes [provider]  potassium chloride (KLOR-CON) 10 MEQ tablet Take 1 tablet (10 mEq total) by mouth daily. 12/08/20 08/09/21 Yes Gollan, Kathlene November, MD  silver sulfADIAZINE (SILVADENE) 1 % cream Apply topically. 03/22/21  Yes [provider]  tamsulosin (FLOMAX) 0.4 MG CAPS capsule Take 0.4 mg by mouth daily after breakfast.   Yes [provider]  timolol (TIMOPTIC) 0.5 % ophthalmic solution Place 2 drops into both eyes daily.  12/19/17  Yes [provider]  traZODone (DESYREL) 50 MG tablet trazodone 50 mg tablet   Yes [provider]  vitamin B-12 (CYANOCOBALAMIN) 500 MCG tablet Take 500 mcg by mouth daily.   Yes [provider]  vitamin E 180 MG (400 UNITS) capsule Take 400 Units by mouth daily.   Yes [provider]  Blood Glucose Monitoring Suppl (ONE TOUCH ULTRA SYSTEM KIT) w/Device KIT 1 kit by Does not apply route once. Use DX code E11.59 10/18/15   [name], MD  INS SYRINGE/NEEDLE 1CC/28G (B-D INSULIN SYRINGE 1CC/28G) 28G X 1/2" 1 ML MISC USE TO ADMINISTER INSULIN DAILY 02/13/16   [name], MD   nitroGLYCERIN (NITROSTAT) 0.4 MG SL tablet Place 1 tablet (0.4 mg total) under the tongue every 5 (five) minutes as needed for chest pain. MAXIMUM 3 TABLETS 12/08/20   Minna Merritts, MD  ONE TOUCH ULTRA TEST test strip USE 1 STRIP TO TEST THREE TIMES DAILY AS DIRECTED 08/31/18   [name], MD  [facility] DELICA LANCETS 27C MISC Use three times daily to check blood sugar. 10/18/15   [name], MD    Allergies Jardiance [empagliflozin] and Metformin and related  Family History  Problem Relation Age of Onset   Diabetes Mother     Social History Social History   Tobacco Use   Smoking status: Former    Packs/day: 0.50    Years: 33.00    Pack years: 16.50    Types: Cigarettes    Quit date: 04/21/1968    Years since quitting: 53.3   Smokeless tobacco: Never  Vaping Use   Vaping Use: Never used  Substance Use Topics   Alcohol use: No   Drug use: No    Review of Systems  Review of Systems  Constitutional:  Positive for fatigue. Negative for chills and fever.  HENT:  Negative for sore throat.   Respiratory:  Positive for shortness of breath.   Cardiovascular:  Positive for leg swelling. Negative for chest pain.  Gastrointestinal:  Negative for abdominal pain.  Genitourinary:  Negative for flank pain.  Musculoskeletal:  Negative for neck pain.  Skin:  Negative for rash and wound.  Allergic/Immunologic: Negative for immunocompromised state.  Neurological:  Negative for weakness and numbness.  Hematological:  Does not bruise/bleed easily.  All other systems reviewed and are negative.   ____________________________________________  PHYSICAL EXAM:      VITAL SIGNS: ED Triage Vitals [08/09/21 1228]  Enc Vitals Group     BP 130/82     Pulse Rate 65     Resp 20     Temp 98 F (36.7 C)     Temp Source Oral     SpO2 (!) 88 %     Weight 188 lb (85.3 kg)     Height '5\' 11"'  (1.803 m)     Head Circumference      Peak Flow      Pain Score 0     Pain Loc      Pain  Edu?      Excl. in [place]?      Physical Exam Vitals and nursing note reviewed.  Constitutional:      General: He is not in acute distress.    Appearance: He is well-developed.  HENT:     Head: Normocephalic and atraumatic.  Eyes:     Conjunctiva/sclera: Conjunctivae normal.  Cardiovascular:     Rate and Rhythm: Normal rate and regular rhythm.     Heart sounds: Normal heart sounds. No murmur heard.   No friction rub.  Pulmonary:     Effort: Pulmonary effort is normal. No respiratory distress.     Breath sounds: Rales (Bibasilar rales) present. No wheezing.     Comments: Slight increase work of breathing but speaking full sentences. Abdominal:     General: There is no distension.     Palpations: Abdomen is soft.     Tenderness: There is no abdominal tenderness.  Musculoskeletal:     Cervical back: Neck supple.     Right lower leg: Edema (1+ pitting) present.     Left lower leg: Edema (1+ pitting) present.  Skin:    General: Skin is warm.     Capillary Refill: Capillary refill takes less than 2 seconds.  Neurological:     Mental Status: He is alert and oriented to person, place, and time.     Motor: No abnormal muscle tone.      ____________________________________________   LABS (all labs ordered are listed, but only abnormal results are displayed)  Labs Reviewed  CBC WITH DIFFERENTIAL/PLATELET - Abnormal; Notable for the following components:      Result Value   MCH 25.7 (*)  RDW 19.2 (*)    All other components within normal limits  COMPREHENSIVE METABOLIC PANEL - Abnormal; Notable for the following components:   BUN 26 (*)    Creatinine, Ser 1.31 (*)    Calcium 8.7 (*)    Albumin 2.8 (*)    AST 118 (*)    ALT 59 (*)    Alkaline Phosphatase 188 (*)    Total Bilirubin 3.3 (*)    GFR, Estimated 53 (*)    All other components within normal limits  BRAIN NATRIURETIC PEPTIDE - Abnormal; Notable for the following components:   B Natriuretic Peptide >4,500.0 (*)     All other components within normal limits  APTT - Abnormal; Notable for the following components:   aPTT 37 (*)    All other components within normal limits  PROTIME-INR - Abnormal; Notable for the following components:   Prothrombin Time 20.9 (*)    INR 1.8 (*)    All other components within normal limits  TROPONIN I (HIGH SENSITIVITY) - Abnormal; Notable for the following components:   Troponin I (High Sensitivity) 533 (*)    All other components within normal limits  TROPONIN I (HIGH SENSITIVITY) - Abnormal; Notable for the following components:   Troponin I (High Sensitivity) 580 (*)    All other components within normal limits  RESP PANEL BY RT-PCR (FLU A&B, COVID) ARPGX2  MAGNESIUM  HEPARIN LEVEL (UNFRACTIONATED)  HEMOGLOBIN A1C  TROPONIN I (HIGH SENSITIVITY)    ____________________________________________  EKG: Sinus tachycardia, ventricular rate 112.  PR 180, QRS 106, QTc 494.  Nonspecific T wave abnormality.  No acute ST elevations. ________________________________________  RADIOLOGY All imaging, including plain films, CT scans, and ultrasounds, independently reviewed by me, and interpretations confirmed via formal radiology reads.  ED MD interpretation:   Chest x-ray: Bilateral effusions  Official radiology report(s): DG Chest 2 View  Result Date: 08/09/2021 CLINICAL DATA:  Shortness of breath. EXAM: CHEST - 2 VIEW COMPARISON:  Chest x-ray dated December 05, 2020. FINDINGS: Stable cardiomegaly. Normal pulmonary vascularity. Increasing small right and unchanged trace left pleural effusions. Increased atelectasis at the right lung base. No pneumothorax. No acute osseous abnormality. IMPRESSION: 1. Increasing small right and unchanged trace left pleural effusions. Electronically Signed   By: Titus Dubin M.D.   On: 08/09/2021 13:38    ____________________________________________  PROCEDURES   Procedure(s) performed (including Critical Care):  .Critical  Care Performed by: Duffy Bruce, MD Authorized by: Duffy Bruce, MD   Critical care provider statement:    Critical care time (minutes):  35   Critical care time was exclusive of:  Separately billable procedures and treating other patients   Critical care was necessary to treat or prevent imminent or life-threatening deterioration of the following conditions:  Cardiac failure, circulatory failure and respiratory failure   Critical care was time spent personally by me on the following activities:  Development of treatment plan with patient or surrogate, discussions with consultants, evaluation of patient's response to treatment, examination of patient, ordering and performing treatments and interventions, ordering and review of laboratory studies, ordering and review of radiographic studies, pulse oximetry and re-evaluation of patient's condition   I assumed direction of critical care for this patient from another provider in my specialty: no     Care discussed with: admitting provider    ____________________________________________  [place] / MDM / [place] / ED COURSE  As part of my medical decision making, I reviewed the following data within the electronic medical  record:  Nursing notes reviewed and incorporated, Old chart reviewed, Notes from prior ED visits, and [place] Controlled Substance Database       *Henry Lindsey was evaluated in Emergency Department on 08/09/2021 for the symptoms described in the history of present illness. He was evaluated in the context of the global COVID-19 pandemic, which necessitated consideration that the patient might be at risk for infection with the SARS-CoV-2 virus that causes COVID-19. Institutional protocols and algorithms that pertain to the evaluation of patients at risk for COVID-19 are in a state of rapid change based on information released by regulatory bodies including the CDC and federal and state organizations. These  policies and algorithms were followed during the patient's care in the ED.  Some ED evaluations and interventions may be delayed as a result of limited staffing during the pandemic.*     Medical Decision Making:-87 year old male with past medical history as above including heart failure, CAD, hypertension, hyperlipidemia, here with dyspnea with exertion and shortness of breath.  Lab work shows significant troponin elevation of 533.  EKG is nonischemic.  He has no chest pain.  BNP greater than 4500 and chest x-ray shows increasing bilateral effusions.  COVID-negative.  CMP with baseline renal function.  CBC without significant anemia.  INR 1.8.  Suspect acute CHF with likely demand ischemia, cannot rule out NSTEMI.  Will place the patient on heparin.  Discussed with Dr. Rockey Situ of cardiology, who will see the patient.  Will admit to medicine. ____________________________________________  FINAL CLINICAL IMPRESSION(S) / ED DIAGNOSES  Final diagnoses:  NSTEMI (non-ST elevated myocardial infarction) ([place])  Acute on chronic systolic congestive heart failure ([place])     MEDICATIONS GIVEN DURING THIS VISIT:  Medications  heparin ADULT infusion 100 units/mL (25000 units/282m) (1,100 Units/hr Intravenous New Bag/Given 08/09/21 1454)  furosemide (LASIX) injection 80 mg (has no administration in time range)  multivitamin with minerals tablet 1 tablet (has no administration in time range)  gabapentin (NEURONTIN) capsule 400 mg (has no administration in time range)  ascorbic acid (VITAMIN C) tablet 500 mg (has no administration in time range)  cholecalciferol (VITAMIN D) tablet 1,000 Units (has no administration in time range)  vitamin E capsule 400 Units (has no administration in time range)  albuterol (PROVENTIL) (2.5 MG/3ML) 0.083% nebulizer solution 3 mL (has no administration in time range)  latanoprost (XALATAN) 0.005 % ophthalmic solution 2 drop (has no administration in time range)  silver  sulfADIAZINE (SILVADENE) 1 % cream (has no administration in time range)  timolol (TIMOPTIC) 0.5 % ophthalmic solution 2 drop (has no administration in time range)  aspirin EC tablet 81 mg (has no administration in time range)  HYDROcodone-acetaminophen (NORCO/VICODIN) 5-325 MG per tablet 1 tablet (has no administration in time range)  atorvastatin (LIPITOR) tablet 40 mg (has no administration in time range)  cholestyramine (QUESTRAN) packet 4 g (has no administration in time range)  metoprolol succinate (TOPROL-XL) 24 hr tablet 100 mg (has no administration in time range)  nitroGLYCERIN (NITROSTAT) SL tablet 0.4 mg (has no administration in time range)  traZODone (DESYREL) tablet 50 mg (has no administration in time range)  pantoprazole (PROTONIX) EC tablet 40 mg (has no administration in time range)  tamsulosin (FLOMAX) capsule 0.4 mg (has no administration in time range)  cilostazol (PLETAL) tablet 100 mg (has no administration in time range)  vitamin B-12 (CYANOCOBALAMIN) tablet 500 mcg (has no administration in time range)  dextromethorphan-guaiFENesin (MUCINEX DM) 30-600 MG per 12 hr tablet 1 tablet (has  no administration in time range)  ondansetron (ZOFRAN) injection 4 mg (has no administration in time range)  hydrALAZINE (APRESOLINE) injection 5 mg (has no administration in time range)  insulin aspart (novoLOG) injection 0-9 Units (has no administration in time range)  insulin aspart (novoLOG) injection 0-5 Units (has no administration in time range)  furosemide (LASIX) injection 40 mg (40 mg Intravenous Given 08/09/21 1339)  aspirin chewable tablet 324 mg (324 mg Oral Given 08/09/21 1339)  heparin bolus via infusion 4,000 Units (4,000 Units Intravenous Bolus from Bag 08/09/21 1459)     ED Discharge Orders     None        Note:  This document was prepared using Dragon voice recognition software and may include unintentional dictation errors.   Duffy Bruce, MD 08/09/21  [phone]

## 2021-08-09 NOTE — Consult Note (Signed)
ANTICOAGULATION CONSULT NOTE - Initial Consult  Pharmacy Consult for heparin  Indication: chest pain/ACS  Allergies  Allergen Reactions   Jardiance [Empagliflozin]     Rash all over and lips turn purple.    Metformin And Related Diarrhea    Patient Measurements: Height: 5\' 11"  (180.3 cm) Weight: 85.3 kg (188 lb) IBW/kg (Calculated) : 75.3 Heparin Dosing Weight: 85.3kg   Vital Signs: Temp: 98 F (36.7 C) (10/20 1228) Temp Source: Oral (10/20 1228) BP: 118/87 (10/20 1328) Pulse Rate: 104 (10/20 1328)  Labs: Recent Labs    08/09/21 1231  HGB 13.1  HCT 41.3  PLT 245  CREATININE 1.31*  TROPONINIHS 533*    Estimated Creatinine Clearance: 43.1 mL/min (A) (by C-G formula based on SCr of 1.31 mg/dL (H)).   Medical History: Past Medical History:  Diagnosis Date   Arthritis    BPH (benign prostatic hyperplasia)    CAD (coronary artery disease)    a. 11/2020 Cath: LM 50d, LAD sev ost dzs, mod to sev prox/mid dzs. D1 sev dzs. LCX mild prox dzs, OM2 mod-sev dzs, RCA 100p. RPL and RPDA fill via L-->R collats. EF 35%. Seen by CT surgery-->Med Rx advised.   Carotid artery stenosis    a. 11/2010 U/S: <50% bilaterally; b. 03/2021 40-59% bilat ICA stenoses.   COPD (chronic obstructive pulmonary disease) ([place])    noted CT 10/05/16 former smoker quit age 26    CVA (cerebral infarction) 11/2010   r thalamic lacunar   Diabetes mellitus    GERD (gastroesophageal reflux disease)    HFrEF (heart failure with reduced ejection fraction) ([place])    a. 10/2020 Echo: EF 35%.   Hyperlipidemia    Hypertension    Ischemic cardiomyopathy    a. 10/2020 Echo: EF 35%. Nl RVSP. Mod MR. Mild TR; b. 11/2020 Cath:  CO/CI 5.05/2.49. LV gram: EF 35%.   Lower extremity cellulitis    Neuromuscular disorder ([place])    Peripheral vascular disease in diabetes mellitus ([place])    a. 06/2012 95% occlusion s/p PTA R SFA (Dr. Lucky Cowboy); b. 03/2021 ABIs: R 1.02, L 0.96.   Stroke [facility])     Medications:  Scheduled:    heparin  4,000 Units Intravenous Once    Assessment: 87yo M with PMH of CAD, COPD, CVA, DM, GERD, HFrEF, HLD, HTN, PVD who was admitted to the hospital for respiratory distress. Pt was found to have elevated troponin. Pharmacy was consulted for heparin dosing.   Baseline CBC was within normal limits  Baseline PT/INR and aPTT ordered  No PTA anticoagulants noted on pt chart   Goal of Therapy:  Heparin level 0.3-0.7 units/ml Monitor platelets by anticoagulation protocol: Yes   Plan:  Give 4000 units bolus x 1 Start heparin infusion at 1100 units/hr Check HL level in 8 hours Continue to monitor H&H and platelets with daily CBC while on heparin gtt   Narda Rutherford, PharmD Pharmacy Resident  08/09/2021 2:29 PM

## 2021-08-09 NOTE — ED Notes (Signed)
Notified Dr. Ellender Hose of critical lab value of Troponin 533.

## 2021-08-09 NOTE — ED Notes (Signed)
Pt given OJ to drink- will recheck cbg after done

## 2021-08-09 NOTE — ED Notes (Signed)
Pt recheck BG 81 and wife is feeding patient at bedside. Pt reports less weakness and feeling better after orange juice. Will recheck BG again after eating diet.

## 2021-08-10 ENCOUNTER — Inpatient Hospital Stay (HOSPITAL_COMMUNITY)
Admit: 2021-08-10 | Discharge: 2021-08-10 | Disposition: A | Discharge status: Home / Self Care | Payer: Medicare Other | Attending: Cardiovascular Disease | Admitting: Cardiovascular Disease

## 2021-08-10 DIAGNOSIS — I5021 Acute systolic (congestive) heart failure: Secondary | ICD-10-CM

## 2021-08-10 DIAGNOSIS — I5023 Acute on chronic systolic (congestive) heart failure: Secondary | ICD-10-CM | POA: Diagnosis not present

## 2021-08-10 LAB — BASIC METABOLIC PANEL
Anion gap: 13 (ref 5–15)
BUN: 28 mg/dL — ABNORMAL HIGH (ref 8–23)
CO2: 26 mmol/L (ref 22–32)
Calcium: 8.6 mg/dL — ABNORMAL LOW (ref 8.9–10.3)
Chloride: 98 mmol/L (ref 98–111)
Creatinine, Ser: 1.4 mg/dL — ABNORMAL HIGH (ref 0.61–1.24)
GFR, Estimated: 49 mL/min — ABNORMAL LOW (ref >60)
Glucose, Bld: 120 mg/dL — ABNORMAL HIGH (ref 70–99)
Potassium: 4.1 mmol/L (ref 3.5–5.1)
Sodium: 137 mmol/L (ref 135–145)

## 2021-08-10 LAB — ECHOCARDIOGRAM COMPLETE
AR max vel: 2.79 cm2
AV Area VTI: 2.57 cm2
AV Area mean vel: 2.64 cm2
AV Mean grad: 4 mmHg
AV Peak grad: 6.9 mmHg
Ao pk vel: 1.31 m/s
Area-P 1/2: 6.14 cm2
Height: 71 in
S' Lateral: 4.3 cm
Single Plane A4C EF: 34.4 %
Weight: 3008 oz

## 2021-08-10 LAB — LIPID PANEL
Cholesterol: 53 mg/dL (ref 0–200)
HDL: 13 mg/dL — ABNORMAL LOW (ref >40)
LDL Cholesterol: 29 mg/dL (ref 0–99)
Total CHOL/HDL Ratio: 4.1 RATIO
Triglycerides: 54 mg/dL (ref <150)
VLDL: 11 mg/dL (ref 0–40)

## 2021-08-10 LAB — CBC
HCT: 37.6 % — ABNORMAL LOW (ref 39.0–52.0)
Hemoglobin: 11.9 g/dL — ABNORMAL LOW (ref 13.0–17.0)
MCH: 25.6 pg — ABNORMAL LOW (ref 26.0–34.0)
MCHC: 31.6 g/dL (ref 30.0–36.0)
MCV: 81 fL (ref 80.0–100.0)
Platelets: 236 10*3/uL (ref 150–400)
RBC: 4.64 MIL/uL (ref 4.22–5.81)
RDW: 19.1 % — ABNORMAL HIGH (ref 11.5–15.5)
WBC: 6.6 10*3/uL (ref 4.0–10.5)
nRBC: 0 % (ref 0.0–0.2)

## 2021-08-10 LAB — CBG MONITORING, ED
Glucose-Capillary: 116 mg/dL — ABNORMAL HIGH (ref 70–99)
Glucose-Capillary: 143 mg/dL — ABNORMAL HIGH (ref 70–99)
Glucose-Capillary: 179 mg/dL — ABNORMAL HIGH (ref 70–99)
Glucose-Capillary: 221 mg/dL — ABNORMAL HIGH (ref 70–99)

## 2021-08-10 LAB — HEPARIN LEVEL (UNFRACTIONATED)
Heparin Unfractionated: 0.31 IU/mL (ref 0.30–0.70)
Heparin Unfractionated: 0.22 IU/mL — ABNORMAL LOW (ref 0.30–0.70)

## 2021-08-10 LAB — HEMOGLOBIN A1C
Hgb A1c MFr Bld: 8.1 % — ABNORMAL HIGH (ref 4.8–5.6)
Mean Plasma Glucose: 186 mg/dL

## 2021-08-10 LAB — MAGNESIUM: Magnesium: 1.9 mg/dL (ref 1.7–2.4)

## 2021-08-10 LAB — TROPONIN I (HIGH SENSITIVITY): Troponin I (High Sensitivity): 1076 ng/L (ref <18)

## 2021-08-10 MED ORDER — HEPARIN BOLUS VIA INFUSION
1100.0000 [IU] | Freq: Once | INTRAVENOUS | Status: AC
  Administered 2021-08-10: 1100 [IU] via INTRAVENOUS
  Filled 2021-08-10: qty 1100

## 2021-08-10 NOTE — ED Notes (Signed)
Pt asleep with call light in reach & telesitter in place. Resp WNL. No apparent acute distress at this time.

## 2021-08-10 NOTE — ED Notes (Signed)
New bag of HEPARIN placed now.

## 2021-08-10 NOTE — Progress Notes (Signed)
[facility] [place] NAME: Henry Lindsey    MR#:  [id number]  DATE OF BIRTH:  [birth year]  SUBJECTIVE:   came in with increasing shortness of breath will prove congestive heart failure bilaterally reported denies chest pain. No family at bedside. Currently on heparin drip. Diaries and well however urine output documented just 580 cc in the ER. REVIEW OF SYSTEMS:   Review of Systems  Constitutional:  Negative for chills, fever and weight loss.  HENT:  Negative for ear discharge, ear pain and nosebleeds.   Eyes:  Negative for blurred vision, pain and discharge.  Respiratory:  Positive for shortness of breath. Negative for sputum production, wheezing and stridor.   Cardiovascular:  Negative for chest pain, palpitations, orthopnea and PND.  Gastrointestinal:  Negative for abdominal pain, diarrhea, nausea and vomiting.  Genitourinary:  Negative for frequency and urgency.  Musculoskeletal:  Negative for back pain and joint pain.  Neurological:  Positive for weakness. Negative for sensory change, speech change and focal weakness.  Psychiatric/Behavioral:  Negative for depression and hallucinations. The patient is not nervous/anxious.   Tolerating Diet:yes Tolerating PT:   DRUG ALLERGIES:   Allergies  Allergen Reactions  . Jardiance [Empagliflozin]     Rash all over and lips turn purple.   . Metformin And Related Diarrhea  . Trazodone Other (See Comments)    Extreme hallucinations/ psychosis     VITALS:  Blood pressure 105/70, pulse 90, temperature 98.9 F (37.2 C), temperature source Oral, resp. rate 18, height 5\' 11"  (1.803 m), weight 85.3 kg, SpO2 96 %.  PHYSICAL EXAMINATION:   Physical Exam  GENERAL:  87 y.o.-year-old patient lying in the bed with no acute distress.  LUNGS: Normal breath sounds bilaterally, no wheezing, rales, rhonchi. No use of accessory muscles of respiration.  CARDIOVASCULAR: S1, S2 normal. No murmurs, rubs, or  gallops.  ABDOMEN: Soft, nontender, nondistended. Bowel sounds present. No organomegaly or mass.  EXTREMITIES: + edema b/l.    NEUROLOGIC: non-focal PSYCHIATRIC:  patient is alert SKIN: No obvious rash, lesion, or ulcer.   LABORATORY PANEL:  CBC Recent Labs  Lab 08/10/21 0643  WBC 6.6  HGB 11.9*  HCT 37.6*  PLT 236    Chemistries  Recent Labs  Lab 08/09/21 1231 08/10/21 0643  NA 137 137  K 4.6 4.1  CL 99 98  CO2 26 26  GLUCOSE 79 120*  BUN 26* 28*  CREATININE 1.31* 1.40*  CALCIUM 8.7* 8.6*  MG 1.8 1.9  AST 118*  --   ALT 59*  --   ALKPHOS 188*  --   BILITOT 3.3*  --    Cardiac Enzymes No results for input(s): TROPONINI in the last 168 hours. RADIOLOGY:  DG Chest 2 View  Result Date: 08/09/2021 CLINICAL DATA:  Shortness of breath. EXAM: CHEST - 2 VIEW COMPARISON:  Chest x-ray dated December 05, 2020. FINDINGS: Stable cardiomegaly. Normal pulmonary vascularity. Increasing small right and unchanged trace left pleural effusions. Increased atelectasis at the right lung base. No pneumothorax. No acute osseous abnormality. IMPRESSION: 1. Increasing small right and unchanged trace left pleural effusions. Electronically Signed   By: Titus Dubin M.D.   On: 08/09/2021 13:38   US Venous Img Lower Bilateral (DVT)  Result Date: 08/09/2021 CLINICAL DATA:  Bilateral lower extremity edema. History of previous right lower extremity DVT. Evaluate for acute or chronic DVT. EXAM: BILATERAL LOWER EXTREMITY VENOUS DOPPLER ULTRASOUND TECHNIQUE: Gray-scale sonography with graded compression, as well as  color Doppler and duplex ultrasound were performed to evaluate the lower extremity deep venous systems from the level of the common femoral vein and including the common femoral, femoral, profunda femoral, popliteal and calf veins including the posterior tibial, peroneal and gastrocnemius veins when visible. The superficial great saphenous vein was also interrogated. Spectral Doppler was  utilized to evaluate flow at rest and with distal augmentation maneuvers in the common femoral, femoral and popliteal veins. COMPARISON:  None. FINDINGS: RIGHT LOWER EXTREMITY Common Femoral Vein: No evidence of thrombus. Normal compressibility, respiratory phasicity and response to augmentation. Saphenofemoral Junction: No evidence of thrombus. Normal compressibility and flow on color Doppler imaging. Profunda Femoral Vein: No evidence of thrombus. Normal compressibility and flow on color Doppler imaging. Femoral Vein: No evidence of thrombus. Normal compressibility, respiratory phasicity and response to augmentation. Popliteal Vein: No evidence of thrombus. Normal compressibility, respiratory phasicity and response to augmentation. Calf Veins: No evidence of thrombus. Normal compressibility and flow on color Doppler imaging. Superficial Great Saphenous Vein: No evidence of thrombus. Normal compressibility. Venous Reflux:  None. Other Findings: Pulsatile venous flow is demonstrated throughout near the entirety of the right lower extremity venous system. LEFT LOWER EXTREMITY Common Femoral Vein: No evidence of thrombus. Normal compressibility, respiratory phasicity and response to augmentation. Saphenofemoral Junction: No evidence of thrombus. Normal compressibility and flow on color Doppler imaging. Profunda Femoral Vein: No evidence of thrombus. Normal compressibility and flow on color Doppler imaging. Femoral Vein: No evidence of thrombus. Normal compressibility, respiratory phasicity and response to augmentation. Popliteal Vein: No evidence of thrombus. Normal compressibility, respiratory phasicity and response to augmentation. Calf Veins: No evidence of thrombus. Normal compressibility and flow on color Doppler imaging. Superficial Great Saphenous Vein: No evidence of thrombus. Normal compressibility. Venous Reflux:  None. Other Findings: Pulsatile flow is demonstrated throughout near the entirety of the left  lower extremity venous system. IMPRESSION: 1. No evidence of acute or chronic DVT within either lower extremity. 2. Pulsatile venous flow demonstrated throughout the bilateral lower extremity venous systems, nonspecific though could be seen in the setting of right-sided heart failure - clinical correlation is advised. Electronically Signed   By: Sandi Mariscal M.D.   On: 08/09/2021 17:15   ECHOCARDIOGRAM COMPLETE  Result Date: 08/10/2021    ECHOCARDIOGRAM REPORT   Patient Name:   Henry Lindsey Date of Exam: 08/10/2021 Medical Rec #:  [id number]     Height:       71.0 in Accession #:    [id number]    Weight:       188.0 lb Date of Birth:  1933-10-08     BSA:          2.054 m Patient Age:    27 years      BP:           102/64 mmHg Patient Gender: M             HR:           89 bpm. Exam Location:  [facility] Procedure: 2D Echo, Color Doppler, Cardiac Doppler and Strain Analysis Indications:     I50.21 congestive heart failure-Acute Systolic  History:         Patient has no prior history of Echocardiogram examinations.                  HFrEF, CAD, COPD, Stroke and PVD; Risk Factors:Diabetes,                  Hypertension and  Dyslipidemia.  Sonographer:     Charmayne Sheer Referring Phys:  [place] Diagnosing Phys: Ida Rogue MD  Sonographer Comments: Global longitudinal strain was attempted. IMPRESSIONS  1. Left ventricular ejection fraction, by estimation, is 25 to 30%. The left ventricle has severely decreased function. The left ventricle demonstrates global hypokinesis with severe hypokinesis of the anterior, septal, and apical region. Inferior wall best preserved. Left ventricular diastolic parameters are consistent with Grade II diastolic dysfunction (pseudonormalization). The average left ventricular global longitudinal strain is -6.3 %. The global longitudinal strain is abnormal.  2. Right ventricular systolic function is moderately reduced. The right ventricular size is moderately enlarged. There is  mildly elevated pulmonary artery systolic pressure. The estimated right ventricular systolic pressure is 97.0 mmHg.  3. Left atrial size was moderately dilated.  4. The mitral valve is normal in structure. Moderate to severe mitral valve regurgitation.  5. Tricuspid valve regurgitation is mild to moderate.  6. Aortic dilatation noted. There is mild dilatation of the aortic root, measuring 39 mm.  7. The inferior vena cava is dilated in size with <50% respiratory variability, suggesting right atrial pressure of 15 mmHg. FINDINGS  Left Ventricle: Left ventricular ejection fraction, by estimation, is 25 to 30%. The left ventricle has severely decreased function. The left ventricle demonstrates global hypokinesis. The average left ventricular global longitudinal strain is -6.3 %. The global longitudinal strain is abnormal. The left ventricular internal cavity size was normal in size. There is no left ventricular hypertrophy. Left ventricular diastolic parameters are consistent with Grade II diastolic dysfunction (pseudonormalization). Right Ventricle: The right ventricular size is moderately enlarged. No increase in right ventricular wall thickness. Right ventricular systolic function is moderately reduced. There is mildly elevated pulmonary artery systolic pressure. The tricuspid regurgitant velocity is 2.58 m/s, and with an assumed right atrial pressure of 15 mmHg, the estimated right ventricular systolic pressure is 26.3 mmHg. Left Atrium: Left atrial size was moderately dilated. Right Atrium: Right atrial size was normal in size. Pericardium: There is no evidence of pericardial effusion. Mitral Valve: The mitral valve is normal in structure. Moderate to severe mitral valve regurgitation, with eccentric posteriorly directed jet. No evidence of mitral valve stenosis. Tricuspid Valve: The tricuspid valve is normal in structure. Tricuspid valve regurgitation is mild to moderate. No evidence of tricuspid stenosis. Aortic  Valve: The aortic valve was not well visualized. Aortic valve regurgitation is not visualized. Mild aortic valve sclerosis is present, with no evidence of aortic valve stenosis. Aortic valve mean gradient measures 4.0 mmHg. Aortic valve peak gradient measures 6.9 mmHg. Aortic valve area, by VTI measures 2.57 cm. Pulmonic Valve: The pulmonic valve was normal in structure. Pulmonic valve regurgitation is mild to moderate. No evidence of pulmonic stenosis. Aorta: Aortic dilatation noted. There is mild dilatation of the aortic root, measuring 39 mm. Venous: The pulmonary veins were not well visualized. The inferior vena cava is dilated in size with less than 50% respiratory variability, suggesting right atrial pressure of 15 mmHg. IAS/Shunts: No atrial level shunt detected by color flow Doppler.  LEFT VENTRICLE PLAX 2D LVIDd:         5.50 cm      Diastology LVIDs:         4.30 cm      LV e' medial:    4.24 cm/s LV PW:         1.20 cm      LV E/e' medial:  25.1 LV IVS:  0.90 cm      LV e' lateral:   6.64 cm/s LVOT diam:     2.40 cm      LV E/e' lateral: 16.0 LV SV:         63 LV SV Index:   31           2D Longitudinal Strain LVOT Area:     4.52 cm     2D Strain GLS Avg:     -6.3 %  LV Volumes (MOD) LV vol d, MOD A4C: 227.0 ml LV vol s, MOD A4C: 149.0 ml LV SV MOD A4C:     227.0 ml RIGHT VENTRICLE RV Basal diam:  4.60 cm RV S prime:     7.51 cm/s LEFT ATRIUM             Index        RIGHT ATRIUM           Index LA diam:        5.00 cm 2.43 cm/m   RA Area:     22.80 cm LA Vol (A2C):   67.9 ml 33.06 ml/m  RA Volume:   69.90 ml  34.03 ml/m LA Vol (A4C):   98.7 ml 48.06 ml/m LA Biplane Vol: 90.3 ml 43.97 ml/m  AORTIC VALVE                    PULMONIC VALVE AV Area (Vmax):    2.79 cm     PV Vmax:          0.91 m/s AV Area (Vmean):   2.64 cm     PV Vmean:         69.500 cm/s AV Area (VTI):     2.57 cm     PV VTI:           0.209 m AV Vmax:           131.00 cm/s  PV Peak grad:     3.3 mmHg AV Vmean:           95.300 cm/s  PV Mean grad:     2.0 mmHg AV VTI:            0.246 m      PR End Diast Vel: 5.66 msec AV Peak Grad:      6.9 mmHg AV Mean Grad:      4.0 mmHg LVOT Vmax:         80.90 cm/s LVOT Vmean:        55.600 cm/s LVOT VTI:          0.140 m LVOT/AV VTI ratio: 0.57  AORTA Ao Root diam: 3.90 cm MITRAL VALVE                TRICUSPID VALVE MV Area (PHT): 6.14 cm     TR Peak grad:   26.6 mmHg MV Decel Time: 124 msec     TR Vmax:        258.00 cm/s MV E velocity: 106.25 cm/s MV A velocity: 90.00 cm/s   SHUNTS MV E/A ratio:  1.18         Systemic VTI:  0.14 m                             Systemic Diam: 2.40 cm Ida Rogue MD Electronically signed by Ida Rogue MD Signature Date/Time: 08/10/2021/1:14:39 PM    Final    ASSESSMENT  AND PLAN:   Henry Lindsey is a 87 y.o. male with medical history significant of sCHF with EF 35%, hypertension, hyperlipidemia, diabetes mellitus, COPD, stroke, GERD, PVD, (s/p for right PTA SFA), CAD, BPH, carotid artery stenosis, CKD 3, diverticulitis, who presents with shortness of breath.  Patient states that he has shortness of breath for almost a week, which has been progressively worsening. Patient was found to have oxygen desaturation to 88% on room air, which improved to 99% on 4 L oxygen.  Patient is normally not wearing oxygen at home.  Acute respiratory failure with hypoxia due to acute on chronic systolic CHF:  --2D echo 11/24/5595 showed EF of 35%.   --came in with leg edema, positive JVD, elevated BNP > 4500, crackles on auscultation, clinically consistent with CHF exacerbation.   --Dr. Rockey Situ of cardiology is consulted due to elevated troponin -Lasix 80 mg bid per card --UOP document needs to be accurate -strict I/O's -Low salt diet -Fluid restriction --soft BP and CKD--cannot do ACE inhibitors   Elevated troponin and hx of CAD: trop 533 --> 580.  No chest pain, likely due to demand ischemia,with chf -IV heparin started in ED--cont for 48 hours -Aspirin,  Lipitor -As needed nitroglycerin   Hyperlipidemia -Lipitor   Hypertension -IV hydralazine as needed -Metoprolol   History of CVA (cerebrovascular accident) -Aspirin, Lipitor   Peripheral vascular disease in diabetes mellitus (HCC) -Cilostazol   BPH (benign prostatic hyperplasia) -Flomax   Type II diabetes mellitus with renal manifestations ([place]), well controlled -Sliding scale insulin -Decrease 70/30 insulin from 21 to 12 units bid   COPD (chronic obstructive pulmonary disease) ([place]): No wheezing or rhonchi. -Bronchodilators   CKD (chronic kidney disease), stage IIIa: Stable -Follow-up with BMP   Abnormal LFTs: Likely due to congestion secondary to CHF exacerbation -Avoid using Tylenol    assess for home oxygen PT/OT    Procedures:none Family communication :none today Consults :[facility] cardiology CODE STATUS:DNR per d/w Dr Blaine Hamper  DVT Prophylaxis :heparin gtt Level of care: Progressive Cardiac Status is: Inpatient  Remains inpatient appropriate because: treatment for CHF        TOTAL TIME TAKING CARE OF THIS PATIENT: 25 minutes.  >50% time spent on counselling and coordination of care  Note: This dictation was prepared with Dragon dictation along with smaller phrase technology. Any transcriptional errors that result from this process are unintentional.  Fritzi Mandes M.D    [facility]   CC: Primary care physician; Baxter Hire, MD Patient ID: Henry Lindsey, male   DOB: December 20, 1932, 87 y.o.   MRN: [id number]

## 2021-08-10 NOTE — ED Notes (Signed)
Pt assisted with urinal

## 2021-08-10 NOTE — TOC Initial Note (Signed)
Transition of Care [facility]) - Initial/Assessment Note    Patient Details  Name: Henry Lindsey MRN: [id number] Date of Birth: Nov 02, 1932  [facility] CM/SW Contact:    Ova Freshwater Phone Number: [phone] 08/10/2021, 3:42 PM  Clinical Narrative:                  Patient [facility] due to with breathing difficulty. Patient does not normally wear ocygen and was in the 80s on EMS arrival. Patients main contact is Hosey,Candace A (Daughter) [phone] [facility] Phone).  CSW spoke with patient who stated he is completely independent with all ADLs and does not use O2 at baseline. Patient does have a nebulizer and uses it when needed. Patient is refusing SNF placement but is agreeable to home health, does not have preference for home health agency. Patient lives with Hyman Bible, significant other, but his daughter Ms. Milana Huntsman is his main contact. TOC will continue to follow.  Expected Discharge Plan: [place] Barriers to Discharge: Continued Medical Work up   Patient Goals and CMS Choice        Expected Discharge Plan and Services Expected Discharge Plan: [place] In-house Referral: Clinical Social Work   Post Acute Care Choice: [place] arrangements for the past 2 months: [place]                                      Prior Living Arrangements/Services Living arrangements for the past 2 months: Single Family Home Lives with:: Significant Other Hyman Bible) Patient language and need for interpreter reviewed:: Yes Do you feel safe going back to the place where you live?: Yes      Need for Family Participation in Patient Care: Yes (Comment) Care giver support system in place?: Yes (comment)   Criminal Activity/Legal Involvement Pertinent to Current Situation/Hospitalization: No - Comment as needed  Activities of Daily Living      Permission Sought/Granted Permission sought to share  information with : Family Supports Permission granted to share information with : Yes, Verbal Permission Granted  Share Information with NAME: Hosey,Candace A (Daughter)   [phone] (Home Phone)           Emotional Assessment Appearance:: Appears stated age Attitude/Demeanor/Rapport: Engaged Affect (typically observed): Stable Orientation: : Fluctuating Orientation (Suspected and/or reported Sundowners), Oriented to Situation, Oriented to  Time, Oriented to Place, Oriented to Self Alcohol / Substance Use: Not Applicable Psych Involvement: No (comment)  Admission diagnosis:  Acute on chronic systolic CHF (congestive heart failure) (HCC) [I50.23] Patient Active Problem List   Diagnosis Date Noted   Acute on chronic systolic CHF (congestive heart failure) ([place]) 08/09/2021   Type II diabetes mellitus with renal manifestations ([place]) 08/09/2021   COPD (chronic obstructive pulmonary disease) (HCC)    CKD (chronic kidney disease), stage IIIa    Acute respiratory failure with hypoxia (HCC)    Elevated troponin    Abnormal LFTs    Swelling of limb 03/27/2021   Ischemic cardiomyopathy 11/24/2020   Angina pectoris ([place]) 11/24/2020   Acute on chronic combined systolic and diastolic CHF (congestive heart failure) ([place]) 11/24/2020   Postoperative visit 08/22/2020   S/P TKR (total knee replacement) using cement, right 08/08/2020   Open wound of lower leg 05/29/2020   Plantar fasciitis 03/22/2020   Lumbar radiculopathy 07/05/2019   Chronic obstructive pulmonary disease (  [place]) 06/11/2019   Gastroesophageal reflux disease without esophagitis 06/11/2019   Neuropathy 06/11/2019   Recurrent productive cough 03/27/2019   Chronic diarrhea    Benign neoplasm of transverse colon    Chronic iron deficiency anemia 10/09/2017   History of diverticulitis 10/05/2017   S/P laparoscopic cholecystectomy 12/05/2016   Bile duct leak    Chest pain 10/05/2016   Pain in joint, ankle and foot 02/25/2015    Diabetic neuropathy ([place]) 10/09/2014   BPH with obstruction/lower urinary tract symptoms 10/09/2014   Bursitis of hip, right 04/05/2014   Glaucoma 04/05/2014   BPH (benign prostatic hyperplasia) 04/05/2014   Peripheral autonomic neuropathy due to DM ([place]) 12/28/2013   Vitamin D deficiency 11/03/2013   Incomplete emptying of bladder 08/04/2013   Benign localized hyperplasia of prostate with urinary obstruction and other lower urinary tract symptoms (LUTS)(600.21) 06/24/2013   Nocturia 06/24/2013   Orchalgia 06/24/2013   Impotence due to erectile dysfunction 06/01/2013   Routine general medical examination at a health care facility 04/04/2013   CAD (coronary artery disease) 07/21/2012   Peripheral vascular disease in diabetes mellitus ([place])    Carotid artery stenosis with cerebral infarction ([place])    History of basal cell carcinoma excision 08/05/2011   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/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

## 2021-08-10 NOTE — ED Notes (Signed)
Pt C/O increased SOB, cough & inability to sleep. Given neb tx, mucinex, & trazadone per [name].   Pt moved in bed to a position of optimal lung expansion. Call light in reach.

## 2021-08-10 NOTE — Consult Note (Signed)
   Heart failure Nurse Navigator Note  HFrEF 25 to 30%.  Left ventricle with global hypokinesis with severe hypokinesis of the anterior, septal and apical region.  Diastolic dysfunction grade 2.  Longitudinal strain is abnormal.  Right ventricular systolic function is moderately reduced.  There is mildly elevated pulmonary artery pressures.  Moderate to severe mitral regurgitation.  Mild to moderate tricuspid regurgitation.  Had previously been reported at 35%.   He presented to the emergency room from home with a 1 week history of progressive shortness of breath.  Chest x-ray showed small bilateral pleural effusions.   Comorbidities:  Hypertension Hyperlipidemia Diabetes COPD Stroke GERD PVD Chronic kidney disease   Medications:  Aspirin 81 mg daily Atorvastatin 40 mg daily Furosemide 80 mg 2 times a day Gabapentin 400 mg twice a day Metoprolol succinate 100 mg daily Flomax 0.4 mg daily  Labs:  Sodium 132, potassium 3.7, chloride 97, CO2 25, BUN 27, creatinine 1.32 Intake 360 mL Output 650 mL Weight 92.8 kg up from 91.5   He presented from home with worsening shortness of breath.  He had also noted a weight gain from 183 up to 189 pounds.  His daughter is at the bedside, explained to the both of them the importance of daily weights recording and reporting a 2 pound weight gain overnight or 5 pounds within a week.    Also went over fluid restriction and following a low-sodium diet.  He states that he lives with his girlfriend and she does not allow salt on the table nor does she cook with it.  His daughter is going to get him the app called the weight grew along with a scale so he can document his weights.  Also discussed in depth that he could follow in the outpatient heart failure clinic and once he gets established that would be another outlet for when he felt that he was getting into trouble or had questions that he could call.  They both voiced  understanding.   He was given living with heart failure teaching booklet along with low-sodium information and an appointment to be seen in the outpatient heart failure clinic on October 31 at 10:30 in the morning.  He has a 3% history of no-shows-4 out of 152 appointments.   Pricilla Riffle RN [facility]

## 2021-08-10 NOTE — ED Notes (Addendum)
This RN called to room by RN Delilah Shan. Pt noted to have pulled out PIV, walking in room naked. Pt states he felt like he was sleep walking. Remains A%Ox4. States he felt like he was in a dream and was at home, he does not remember getting OOB.Endorses he has not taken trazadone in the past. "Maybe this is from the sleeping med".   PIV dressed, heparin restarted in hand PIV. Pt assisted to [facility] for BM. Able to have BM sans complications, assisted back to bed, changed into fresh gown socks & linens. Tele sitter set up in room for safety.

## 2021-08-10 NOTE — Progress Notes (Signed)
*  PRELIMINARY RESULTS* Echocardiogram 2D Echocardiogram has been performed.  Henry Lindsey Malaika Arnall 08/10/2021, 11:07 AM

## 2021-08-10 NOTE — Consult Note (Signed)
ANTICOAGULATION CONSULT NOTE  Pharmacy Consult for heparin  Indication: chest pain/ACS   Patient Measurements: Height: 5\' 11"  (180.3 cm) Weight: 85.3 kg (188 lb) IBW/kg (Calculated) : 75.3  Vital Signs: Temp: 98.2 F (36.8 C) (10/21 1717) Temp Source: Oral (10/21 1717) BP: 112/74 (10/21 1717) Pulse Rate: 85 (10/21 1717)  Labs: Recent Labs    08/09/21 1231 08/09/21 1409 08/09/21 1658 08/09/21 2151 08/10/21 0643 08/10/21 0851 08/10/21 1705  HGB 13.1  --   --   --  11.9*  --   --   HCT 41.3  --   --   --  37.6*  --   --   PLT 245  --   --   --  236  --   --   APTT  --  37*  --   --   --   --   --   LABPROT  --  20.9*  --   --   --   --   --   INR  --  1.8*  --   --   --   --   --   HEPARINUNFRC  --   --   --  0.16*  --  0.31 0.22*  CREATININE 1.31*  --   --   --  1.40*  --   --   TROPONINIHS 533* 580* 816* 1,183* 1,076*  --   --      Estimated Creatinine Clearance: 40.3 mL/min (A) (by C-G formula based on SCr of 1.4 mg/dL (H)).   Medical History: Past Medical History:  Diagnosis Date   Arthritis    BPH (benign prostatic hyperplasia)    CAD (coronary artery disease)    a. 11/2020 Cath: LM 50d, LAD sev ost dzs, mod to sev prox/mid dzs. D1 sev dzs. LCX mild prox dzs, OM2 mod-sev dzs, RCA 100p. RPL and RPDA fill via L-->R collats. EF 35%. Seen by CT surgery-->Med Rx advised.   Carotid artery stenosis    a. 11/2010 U/S: <50% bilaterally; b. 03/2021 40-59% bilat ICA stenoses.   COPD (chronic obstructive pulmonary disease) ([place])    noted CT 10/05/16 former smoker quit age 42    CVA (cerebral infarction) 11/2010   r thalamic lacunar   Diabetes mellitus    GERD (gastroesophageal reflux disease)    HFrEF (heart failure with reduced ejection fraction) ([place])    a. 10/2020 Echo: EF 35%.   Hyperlipidemia    Hypertension    Ischemic cardiomyopathy    a. 10/2020 Echo: EF 35%. Nl RVSP. Mod MR. Mild TR; b. 11/2020 Cath:  CO/CI 5.05/2.49. LV gram: EF 35%.   Lower extremity  cellulitis    Neuromuscular disorder ([place])    Peripheral vascular disease in diabetes mellitus ([place])    a. 06/2012 95% occlusion s/p PTA R SFA (Dr. Lucky Cowboy); b. 03/2021 ABIs: R 1.02, L 0.96.   Stroke [facility])     Medications:  Scheduled:   ascorbic acid  500 mg Oral Daily   aspirin EC  81 mg Oral Daily   atorvastatin  40 mg Oral Daily   cholecalciferol  1,000 Units Oral Daily   cholestyramine  4 g Oral TID WC   furosemide  80 mg Intravenous BID   gabapentin  400 mg Oral BID   insulin aspart  0-5 Units Subcutaneous QHS   insulin aspart  0-9 Units Subcutaneous TID WC   insulin aspart protamine- aspart  12 Units Subcutaneous BID WC   ipratropium-albuterol  3 mL  Nebulization Q6H   latanoprost  2 drop Both Eyes QHS   metoprolol succinate  100 mg Oral Daily   multivitamin with minerals  1 tablet Oral Daily   pantoprazole  40 mg Oral Daily   silver sulfADIAZINE   Topical Daily   tamsulosin  0.4 mg Oral QPC breakfast   timolol  2 drop Both Eyes Daily   vitamin B-12  500 mcg Oral Daily   vitamin E  400 Units Oral Daily    Assessment: 87yo M with PMH of CAD, COPD, CVA, DM, GERD, HFrEF, HLD, HTN, PVD who was admitted to the hospital for respiratory distress. Pt was found to have elevated troponin. Pharmacy was consulted for heparin dosing.   Baseline CBC was within normal limits   No PTA anticoagulants noted on pt chart   Date/time  HL Interpretation/comment 10/20 2151 0.16 Subthera at 1100 units/hr 10/21 0851 0.31 Thera x1, at 1400 units/hr 10/21 1705 0.22 Subthera at 1400 units/hr  Goal of Therapy:  Heparin level 0.3-0.7 units/ml Monitor platelets by anticoagulation protocol: Yes   Plan:  Heparin subtherapeutic, will bolus with 1100 units and increase infusion rate to 1550units/hr Check HL level in 8 hours to after rate chnage  Continue to monitor H&H and platelets with daily CBC while on heparin gtt   Narda Rutherford, PharmD Pharmacy Resident  08/10/2021 5:58 PM

## 2021-08-10 NOTE — Progress Notes (Signed)
Progress Note  Patient Name: Henry Lindsey Date of Encounter: 08/10/2021  Primary Cardiologist: Ida Rogue, MD  Subjective   Feels like breathing is a little bit better.  He notes good urine output.  I's and O's are inaccurate.  Still volume overloaded.  Inpatient Medications    Scheduled Meds:  ascorbic acid  500 mg Oral Daily   aspirin EC  81 mg Oral Daily   atorvastatin  40 mg Oral Daily   cholecalciferol  1,000 Units Oral Daily   cholestyramine  4 g Oral TID WC   cilostazol  100 mg Oral BID   furosemide  80 mg Intravenous BID   gabapentin  400 mg Oral BID   insulin aspart  0-5 Units Subcutaneous QHS   insulin aspart  0-9 Units Subcutaneous TID WC   insulin aspart protamine- aspart  12 Units Subcutaneous BID WC   ipratropium-albuterol  3 mL Nebulization Q6H   latanoprost  2 drop Both Eyes QHS   metoprolol succinate  100 mg Oral Daily   multivitamin with minerals  1 tablet Oral Daily   pantoprazole  40 mg Oral Daily   silver sulfADIAZINE   Topical Daily   tamsulosin  0.4 mg Oral QPC breakfast   timolol  2 drop Both Eyes Daily   vitamin B-12  500 mcg Oral Daily   vitamin E  400 Units Oral Daily   Continuous Infusions:  heparin 1,400 Units/hr (08/10/21 0715)   PRN Meds: albuterol, dextromethorphan-guaiFENesin, hydrALAZINE, HYDROcodone-acetaminophen, nitroGLYCERIN, ondansetron (ZOFRAN) IV, traZODone   Vital Signs    Vitals:   08/10/21 0830 08/10/21 0900 08/10/21 0930 08/10/21 1143  BP: 113/65 127/64 102/64 105/70  Pulse: 83 73 91 90  Resp: 15 15 20 18   Temp:    98.9 F (37.2 C)  TempSrc:    Oral  SpO2: 100% 99% 97% 96%  Weight:      Height:        Intake/Output Summary (Last 24 hours) at 08/10/2021 1405 Last data filed at 08/10/2021 0500 Gross per 24 hour  Intake --  Output 580 ml  Net -580 ml   Filed Weights   08/09/21 1228  Weight: 85.3 kg    Physical Exam   GEN: Well nourished, well developed, in no acute distress.  HEENT: Grossly  normal.  Neck: Supple, no JVD, carotid bruits, or masses. Cardiac: RRR, no murmurs, rubs, or gallops. No clubbing, cyanosis, 1+ bilateral lower extremity edema.  Radials 2+, distal pulses diminished.    Respiratory:  Respirations regular and unlabored, bibasilar crackles. GI: Semifirm and protuberant.  Nontender. BS + x 4. MS: no deformity or atrophy. Skin: warm and dry, no rash. Neuro:  Strength and sensation are intact. Psych: AAOx3.  Normal affect.  Labs    Chemistry Recent Labs  Lab 08/09/21 1231 08/10/21 0643  NA 137 137  K 4.6 4.1  CL 99 98  CO2 26 26  GLUCOSE 79 120*  BUN 26* 28*  CREATININE 1.31* 1.40*  CALCIUM 8.7* 8.6*  PROT 7.3  --   ALBUMIN 2.8*  --   AST 118*  --   ALT 59*  --   ALKPHOS 188*  --   BILITOT 3.3*  --   GFRNONAA 53* 49*  ANIONGAP 12 13     Hematology Recent Labs  Lab 08/09/21 1231 08/10/21 0643  WBC 8.1 6.6  RBC 5.10 4.64  HGB 13.1 11.9*  HCT 41.3 37.6*  MCV 81.0 81.0  MCH 25.7* 25.6*  MCHC  31.7 31.6  RDW 19.2* 19.1*  PLT 245 236    Cardiac Enzymes  Recent Labs  Lab 08/09/21 1231 08/09/21 1409 08/09/21 1658 08/09/21 2151 08/10/21 0643  TROPONINIHS 533* 580* 816* 1,183* 1,076*      BNP Recent Labs  Lab 08/09/21 1231  BNP >4,500.0*     Lipids  Lab Results  Component Value Date   CHOL 53 08/10/2021   HDL 13 (L) 08/10/2021   LDLCALC 29 08/10/2021   LDLDIRECT 62.0 10/04/2016   TRIG 54 08/10/2021   CHOLHDL 4.1 08/10/2021    HbA1c  Lab Results  Component Value Date   HGBA1C 6.7 (H) 08/08/2020    Radiology    DG Chest 2 View  Result Date: 08/09/2021 CLINICAL DATA:  Shortness of breath. EXAM: CHEST - 2 VIEW COMPARISON:  Chest x-ray dated December 05, 2020. FINDINGS: Stable cardiomegaly. Normal pulmonary vascularity. Increasing small right and unchanged trace left pleural effusions. Increased atelectasis at the right lung base. No pneumothorax. No acute osseous abnormality. IMPRESSION: 1. Increasing small right  and unchanged trace left pleural effusions. Electronically Signed   By: Titus Dubin M.D.   On: 08/09/2021 13:38   US Venous Img Lower Bilateral (DVT)  Result Date: 08/09/2021 CLINICAL DATA:  Bilateral lower extremity edema. History of previous right lower extremity DVT. Evaluate for acute or chronic DVT. EXAM: BILATERAL LOWER EXTREMITY VENOUS DOPPLER ULTRASOUND TECHNIQUE: Gray-scale sonography with graded compression, as well as color Doppler and duplex ultrasound were performed to evaluate the lower extremity deep venous systems from the level of the common femoral vein and including the common femoral, femoral, profunda femoral, popliteal and calf veins including the posterior tibial, peroneal and gastrocnemius veins when visible. The superficial great saphenous vein was also interrogated. Spectral Doppler was utilized to evaluate flow at rest and with distal augmentation maneuvers in the common femoral, femoral and popliteal veins. COMPARISON:  None. FINDINGS: RIGHT LOWER EXTREMITY Common Femoral Vein: No evidence of thrombus. Normal compressibility, respiratory phasicity and response to augmentation. Saphenofemoral Junction: No evidence of thrombus. Normal compressibility and flow on color Doppler imaging. Profunda Femoral Vein: No evidence of thrombus. Normal compressibility and flow on color Doppler imaging. Femoral Vein: No evidence of thrombus. Normal compressibility, respiratory phasicity and response to augmentation. Popliteal Vein: No evidence of thrombus. Normal compressibility, respiratory phasicity and response to augmentation. Calf Veins: No evidence of thrombus. Normal compressibility and flow on color Doppler imaging. Superficial Great Saphenous Vein: No evidence of thrombus. Normal compressibility. Venous Reflux:  None. Other Findings: Pulsatile venous flow is demonstrated throughout near the entirety of the right lower extremity venous system. LEFT LOWER EXTREMITY Common Femoral Vein: No  evidence of thrombus. Normal compressibility, respiratory phasicity and response to augmentation. Saphenofemoral Junction: No evidence of thrombus. Normal compressibility and flow on color Doppler imaging. Profunda Femoral Vein: No evidence of thrombus. Normal compressibility and flow on color Doppler imaging. Femoral Vein: No evidence of thrombus. Normal compressibility, respiratory phasicity and response to augmentation. Popliteal Vein: No evidence of thrombus. Normal compressibility, respiratory phasicity and response to augmentation. Calf Veins: No evidence of thrombus. Normal compressibility and flow on color Doppler imaging. Superficial Great Saphenous Vein: No evidence of thrombus. Normal compressibility. Venous Reflux:  None. Other Findings: Pulsatile flow is demonstrated throughout near the entirety of the left lower extremity venous system. IMPRESSION: 1. No evidence of acute or chronic DVT within either lower extremity. 2. Pulsatile venous flow demonstrated throughout the bilateral lower extremity venous systems, nonspecific though could be seen in  the setting of right-sided heart failure - clinical correlation is advised. Electronically Signed   By: Sandi Mariscal M.D.   On: 08/09/2021 17:15   Telemetry    RSR, PVCs - Personally Reviewed  Cardiac Studies   2D Echocardiogram 10.21.2022  1. Left ventricular ejection fraction, by estimation, is 25 to 30%. The  left ventricle has severely decreased function. The left ventricle  demonstrates global hypokinesis with severe hypokinesis of the anterior,  septal, and apical region. Inferior wall  best preserved. Left ventricular diastolic parameters are consistent with  Grade II diastolic dysfunction (pseudonormalization). The average left  ventricular global longitudinal strain is -6.3 %. The global longitudinal  strain is abnormal.   2. Right ventricular systolic function is moderately reduced. The right  ventricular size is moderately enlarged.  There is mildly elevated  pulmonary artery systolic pressure. The estimated right ventricular  systolic pressure is 35.0 mmHg.   3. Left atrial size was moderately dilated.   4. The mitral valve is normal in structure. Moderate to severe mitral  valve regurgitation.   5. Tricuspid valve regurgitation is mild to moderate.   6. Aortic dilatation noted. There is mild dilatation of the aortic root,  measuring 39 mm.   7. The inferior vena cava is dilated in size with <50% respiratory  variability, suggesting right atrial pressure of 15 mmHg.   Patient Profile     87 y.o. male with a history of CAD, ICM, HFrEF, HTN, HL, DMII, GERD, CVA, PAD, and carotid arterial dzs, who presented October 19 due to progressive dyspnea, increasing abdominal girth, lower extremity edema.  Elevated troponin to 1183.  EF 25 to 30% with global hypokinesis and severe anterior, septal, and apical hypokinesis on echo this admission.  Assessment & Plan    1.  Acute on chronic HFrEF/ischemic cardiomyopathy: EF 35% by echo earlier this year with known moderate multivessel CAD and occluded RCA by cath November 2022.  Was doing well until October 16, when he started noting increasing dyspnea exertion followed by increased abdominal girth, orthopnea, lower extremity edema, and early satiety.  Presented to the ED on October 19 after calling EMS and being found to be hypoxic at 80%.  BNP greater than 4500.  He has been receiving furosemide 80 mg IV twice daily.  He notes good urine output though I's and O's are inaccurate.  He remains volume overloaded with JVD, lower extremity edema, and increased abdominal girth.  Echo this morning shows an EF of 25 to 30% with global hypokinesis and severe anterior, septal, and apical hypokinesis.  Grade 2 diastolic dysfunction also noted as well as moderate to severe mitral regurgitation.  BUN and creatinine up slightly since admission at 28/1.40 (was 26/1.31).  LFTs also elevated on mission the  setting of volume overload.  Continue IV diuresis and follow creatinine closely.  Given high likelihood of low output, may need to consider inotropic therapy if renal function worsens.  Continue beta-blocker.  He is not on an ACE/ARB/Entresto/MRA secondary to history of soft blood pressures and relative hypotension.  No SGLT2 inhibitor secondary to CKD.  2.  Demand ischemia/CAD: As above, status post catheterization February 2022 showing an occluded RCA with moderate left main/LAD/D1/OM 2 disease.  Seen by CT surgery with recommendation for medical therapy.  He has not been having chest pain.  Not felt to be a good candidate for PCI given diffuse nature of left main/proximal LAD disease and concern regarding aortic atherosclerosis which would make him a poor  candidate for mechanical support.  He is not currently having chest pain.  Continue aspirin, statin, beta-blocker.  As previously advised-heparin x48 hours.  3.  Essential hypertension: Stable with pressures trending in the low 100s to 120s.  4.  Hyperlipidemia: Continue statin therapy.  Plan to follow-up LFTs given elevation in the setting of heart failure.  5.  Type 2 diabetes mellitus: A1c 6.7.  Per medicine team.  6.  Peripheral arterial disease: No recent claudication.  Stop pletal in setting of CHF.  This was d/c'd in outpt setting but pt continued to take - says it helped his restless legs.  7.  Stage III chronic kidney disease: Creatinine up slightly.  Follow with diuresis.  As above, if he develops worsening cardiorenal syndrome, will need to strongly consider inotropic support.  8.  Elevated LFTs: Likely secondary to right heart failure.  Follow with diuresis.  Signed, Murray Hodgkins, NP  08/10/2021, 2:05 PM    For questions or updates, please contact   Please consult [email or web address] for contact info under Cardiology/STEMI.

## 2021-08-10 NOTE — Consult Note (Signed)
ANTICOAGULATION CONSULT NOTE  Pharmacy Consult for heparin  Indication: chest pain/ACS   Patient Measurements: Height: 5\' 11"  (180.3 cm) Weight: 85.3 kg (188 lb) IBW/kg (Calculated) : 75.3  Vital Signs: BP: 102/64 (10/21 0930) Pulse Rate: 91 (10/21 0930)  Labs: Recent Labs    08/09/21 1231 08/09/21 1409 08/09/21 1658 08/09/21 2151 08/10/21 0643 08/10/21 0851  HGB 13.1  --   --   --  11.9*  --   HCT 41.3  --   --   --  37.6*  --   PLT 245  --   --   --  236  --   APTT  --  37*  --   --   --   --   LABPROT  --  20.9*  --   --   --   --   INR  --  1.8*  --   --   --   --   HEPARINUNFRC  --   --   --  0.16*  --  0.31  CREATININE 1.31*  --   --   --  1.40*  --   TROPONINIHS 533* 580* 816* 1,183* 1,076*  --      Estimated Creatinine Clearance: 40.3 mL/min (A) (by C-G formula based on SCr of 1.4 mg/dL (H)).   Medical History: Past Medical History:  Diagnosis Date   Arthritis    BPH (benign prostatic hyperplasia)    CAD (coronary artery disease)    a. 11/2020 Cath: LM 50d, LAD sev ost dzs, mod to sev prox/mid dzs. D1 sev dzs. LCX mild prox dzs, OM2 mod-sev dzs, RCA 100p. RPL and RPDA fill via L-->R collats. EF 35%. Seen by CT surgery-->Med Rx advised.   Carotid artery stenosis    a. 11/2010 U/S: <50% bilaterally; b. 03/2021 40-59% bilat ICA stenoses.   COPD (chronic obstructive pulmonary disease) ([place])    noted CT 10/05/16 former smoker quit age 83    CVA (cerebral infarction) 11/2010   r thalamic lacunar   Diabetes mellitus    GERD (gastroesophageal reflux disease)    HFrEF (heart failure with reduced ejection fraction) ([place])    a. 10/2020 Echo: EF 35%.   Hyperlipidemia    Hypertension    Ischemic cardiomyopathy    a. 10/2020 Echo: EF 35%. Nl RVSP. Mod MR. Mild TR; b. 11/2020 Cath:  CO/CI 5.05/2.49. LV gram: EF 35%.   Lower extremity cellulitis    Neuromuscular disorder ([place])    Peripheral vascular disease in diabetes mellitus ([place])    a. 06/2012 95% occlusion s/p PTA  R SFA (Dr. Lucky Cowboy); b. 03/2021 ABIs: R 1.02, L 0.96.   Stroke [facility])     Medications:  Scheduled:   ascorbic acid  500 mg Oral Daily   aspirin EC  81 mg Oral Daily   atorvastatin  40 mg Oral Daily   cholecalciferol  1,000 Units Oral Daily   cholestyramine  4 g Oral TID WC   cilostazol  100 mg Oral BID   furosemide  80 mg Intravenous BID   gabapentin  400 mg Oral BID   insulin aspart  0-5 Units Subcutaneous QHS   insulin aspart  0-9 Units Subcutaneous TID WC   insulin aspart protamine- aspart  12 Units Subcutaneous BID WC   ipratropium-albuterol  3 mL Nebulization Q6H   latanoprost  2 drop Both Eyes QHS   metoprolol succinate  100 mg Oral Daily   multivitamin with minerals  1 tablet Oral Daily  pantoprazole  40 mg Oral Daily   silver sulfADIAZINE   Topical Daily   tamsulosin  0.4 mg Oral QPC breakfast   timolol  2 drop Both Eyes Daily   vitamin B-12  500 mcg Oral Daily   vitamin E  400 Units Oral Daily    Assessment: 87yo M with PMH of CAD, COPD, CVA, DM, GERD, HFrEF, HLD, HTN, PVD who was admitted to the hospital for respiratory distress. Pt was found to have elevated troponin. Pharmacy was consulted for heparin dosing.   Baseline CBC was within normal limits   No PTA anticoagulants noted on pt chart   Date/time  HL Interpretation/comment 10/20 2151 0.16 Subthera at 1100 units/hr 10/21 0851 0.31 Thera x1, at 1400 units/hr  Goal of Therapy:  Heparin level 0.3-0.7 units/ml Monitor platelets by anticoagulation protocol: Yes   Plan:  Heparin therapeutic, will keep infusion rate at 1400units/hr Check HL level in 8 hours to confirm therapeutic  Continue to monitor H&H and platelets with daily CBC while on heparin gtt   Narda Rutherford, PharmD Pharmacy Resident  08/10/2021 10:12 AM

## 2021-08-11 DIAGNOSIS — I214 Non-ST elevation (NSTEMI) myocardial infarction: Secondary | ICD-10-CM | POA: Diagnosis not present

## 2021-08-11 DIAGNOSIS — R600 Localized edema: Secondary | ICD-10-CM | POA: Diagnosis not present

## 2021-08-11 DIAGNOSIS — J9601 Acute respiratory failure with hypoxia: Secondary | ICD-10-CM | POA: Diagnosis not present

## 2021-08-11 DIAGNOSIS — I5023 Acute on chronic systolic (congestive) heart failure: Secondary | ICD-10-CM | POA: Diagnosis not present

## 2021-08-11 LAB — BASIC METABOLIC PANEL
Anion gap: 10 (ref 5–15)
BUN: 27 mg/dL — ABNORMAL HIGH (ref 8–23)
CO2: 25 mmol/L (ref 22–32)
Calcium: 8.3 mg/dL — ABNORMAL LOW (ref 8.9–10.3)
Chloride: 97 mmol/L — ABNORMAL LOW (ref 98–111)
Creatinine, Ser: 1.32 mg/dL — ABNORMAL HIGH (ref 0.61–1.24)
GFR, Estimated: 53 mL/min — ABNORMAL LOW (ref >60)
Glucose, Bld: 156 mg/dL — ABNORMAL HIGH (ref 70–99)
Potassium: 3.7 mmol/L (ref 3.5–5.1)
Sodium: 132 mmol/L — ABNORMAL LOW (ref 135–145)

## 2021-08-11 LAB — CBC
HCT: 38.1 % — ABNORMAL LOW (ref 39.0–52.0)
HCT: 40 % (ref 39.0–52.0)
Hemoglobin: 11.6 g/dL — ABNORMAL LOW (ref 13.0–17.0)
Hemoglobin: 12.1 g/dL — ABNORMAL LOW (ref 13.0–17.0)
MCH: 24.6 pg — ABNORMAL LOW (ref 26.0–34.0)
MCH: 24.3 pg — ABNORMAL LOW (ref 26.0–34.0)
MCHC: 30.4 g/dL (ref 30.0–36.0)
MCHC: 30.3 g/dL (ref 30.0–36.0)
MCV: 80.7 fL (ref 80.0–100.0)
MCV: 80.5 fL (ref 80.0–100.0)
Platelets: 247 10*3/uL (ref 150–400)
Platelets: 270 10*3/uL (ref 150–400)
RBC: 4.72 MIL/uL (ref 4.22–5.81)
RBC: 4.97 MIL/uL (ref 4.22–5.81)
RDW: 18.9 % — ABNORMAL HIGH (ref 11.5–15.5)
RDW: 19.2 % — ABNORMAL HIGH (ref 11.5–15.5)
WBC: 7.5 10*3/uL (ref 4.0–10.5)
WBC: 7.3 10*3/uL (ref 4.0–10.5)
nRBC: 0 % (ref 0.0–0.2)
nRBC: 0 % (ref 0.0–0.2)

## 2021-08-11 LAB — HEPARIN LEVEL (UNFRACTIONATED): Heparin Unfractionated: 0.41 IU/mL (ref 0.30–0.70)

## 2021-08-11 LAB — GLUCOSE, CAPILLARY
Glucose-Capillary: 138 mg/dL — ABNORMAL HIGH (ref 70–99)
Glucose-Capillary: 157 mg/dL — ABNORMAL HIGH (ref 70–99)
Glucose-Capillary: 157 mg/dL — ABNORMAL HIGH (ref 70–99)
Glucose-Capillary: 145 mg/dL — ABNORMAL HIGH (ref 70–99)
Glucose-Capillary: 179 mg/dL — ABNORMAL HIGH (ref 70–99)

## 2021-08-11 MED ORDER — ORAL CARE MOUTH RINSE
15.0000 mL | Freq: Two times a day (BID) | OROMUCOSAL | Status: DC
  Administered 2021-08-11 – 2021-08-14 (×7): 15 mL via OROMUCOSAL

## 2021-08-11 MED ORDER — HEPARIN SODIUM (PORCINE) 5000 UNIT/ML IJ SOLN
5000.0000 [IU] | Freq: Three times a day (TID) | INTRAMUSCULAR | Status: DC
Start: 2021-08-11 — End: 2021-08-14
  Administered 2021-08-11 – 2021-08-14 (×9): 5000 [IU] via SUBCUTANEOUS
  Filled 2021-08-11 (×9): qty 1

## 2021-08-11 NOTE — Progress Notes (Signed)
[facility] [place] NAME: Henry Lindsey    MR#:  [id number]  DATE OF BIRTH:  Jul 07, 1933  SUBJECTIVE:   came in with increasing shortness of breath will prove congestive heart failure bilaterally reported denies chest pain. No family at bedside. Currently on heparin drip.  Breathing a bit better Good uop so far REVIEW OF SYSTEMS:   Review of Systems  Constitutional:  Negative for chills, fever and weight loss.  HENT:  Negative for ear discharge, ear pain and nosebleeds.   Eyes:  Negative for blurred vision, pain and discharge.  Respiratory:  Positive for shortness of breath. Negative for sputum production, wheezing and stridor.   Cardiovascular:  Negative for chest pain, palpitations, orthopnea and PND.  Gastrointestinal:  Negative for abdominal pain, diarrhea, nausea and vomiting.  Genitourinary:  Negative for frequency and urgency.  Musculoskeletal:  Negative for back pain and joint pain.  Neurological:  Positive for weakness. Negative for sensory change, speech change and focal weakness.  Psychiatric/Behavioral:  Negative for depression and hallucinations. The patient is not nervous/anxious.   Tolerating Diet:yes Tolerating PT:   DRUG ALLERGIES:   Allergies  Allergen Reactions  . Jardiance [Empagliflozin]     Rash all over and lips turn purple.   . Metformin And Related Diarrhea  . Trazodone Other (See Comments)    Extreme hallucinations/ psychosis     VITALS:  Blood pressure 104/75, pulse 87, temperature 98.5 F (36.9 C), resp. rate 18, height 5\' 11"  (1.803 m), weight 89.9 kg, SpO2 94 %.  PHYSICAL EXAMINATION:   Physical Exam  GENERAL:  87 y.o.-year-old patient lying in the bed with no acute distress.  LUNGS: Normal breath sounds bilaterally, no wheezing, rales, rhonchi. No use of accessory muscles of respiration.  CARDIOVASCULAR: S1, S2 normal. No murmurs, rubs, or gallops.  ABDOMEN: Soft, nontender, nondistended. Bowel  sounds present. No organomegaly or mass.  EXTREMITIES: + edema b/l.    NEUROLOGIC: non-focal PSYCHIATRIC:  patient is alert SKIN: No obvious rash, lesion, or ulcer.   LABORATORY PANEL:  CBC Recent Labs  Lab 08/11/21 1111  WBC 7.3  HGB 12.1*  HCT 40.0  PLT 270     Chemistries  Recent Labs  Lab 08/09/21 1231 08/10/21 0643 08/11/21 1111  NA 137 137 132*  K 4.6 4.1 3.7  CL 99 98 97*  CO2 26 26 25   GLUCOSE 79 120* 156*  BUN 26* 28* 27*  CREATININE 1.31* 1.40* 1.32*  CALCIUM 8.7* 8.6* 8.3*  MG 1.8 1.9  --   AST 118*  --   --   ALT 59*  --   --   ALKPHOS 188*  --   --   BILITOT 3.3*  --   --     Cardiac Enzymes No results for input(s): TROPONINI in the last 168 hours. RADIOLOGY:  US Venous Img Lower Bilateral (DVT)  Result Date: 08/09/2021 CLINICAL DATA:  Bilateral lower extremity edema. History of previous right lower extremity DVT. Evaluate for acute or chronic DVT. EXAM: BILATERAL LOWER EXTREMITY VENOUS DOPPLER ULTRASOUND TECHNIQUE: Gray-scale sonography with graded compression, as well as color Doppler and duplex ultrasound were performed to evaluate the lower extremity deep venous systems from the level of the common femoral vein and including the common femoral, femoral, profunda femoral, popliteal and calf veins including the posterior tibial, peroneal and gastrocnemius veins when visible. The superficial great saphenous vein was also interrogated. Spectral Doppler was utilized to evaluate flow at rest  and with distal augmentation maneuvers in the common femoral, femoral and popliteal veins. COMPARISON:  None. FINDINGS: RIGHT LOWER EXTREMITY Common Femoral Vein: No evidence of thrombus. Normal compressibility, respiratory phasicity and response to augmentation. Saphenofemoral Junction: No evidence of thrombus. Normal compressibility and flow on color Doppler imaging. Profunda Femoral Vein: No evidence of thrombus. Normal compressibility and flow on color Doppler imaging.  Femoral Vein: No evidence of thrombus. Normal compressibility, respiratory phasicity and response to augmentation. Popliteal Vein: No evidence of thrombus. Normal compressibility, respiratory phasicity and response to augmentation. Calf Veins: No evidence of thrombus. Normal compressibility and flow on color Doppler imaging. Superficial Great Saphenous Vein: No evidence of thrombus. Normal compressibility. Venous Reflux:  None. Other Findings: Pulsatile venous flow is demonstrated throughout near the entirety of the right lower extremity venous system. LEFT LOWER EXTREMITY Common Femoral Vein: No evidence of thrombus. Normal compressibility, respiratory phasicity and response to augmentation. Saphenofemoral Junction: No evidence of thrombus. Normal compressibility and flow on color Doppler imaging. Profunda Femoral Vein: No evidence of thrombus. Normal compressibility and flow on color Doppler imaging. Femoral Vein: No evidence of thrombus. Normal compressibility, respiratory phasicity and response to augmentation. Popliteal Vein: No evidence of thrombus. Normal compressibility, respiratory phasicity and response to augmentation. Calf Veins: No evidence of thrombus. Normal compressibility and flow on color Doppler imaging. Superficial Great Saphenous Vein: No evidence of thrombus. Normal compressibility. Venous Reflux:  None. Other Findings: Pulsatile flow is demonstrated throughout near the entirety of the left lower extremity venous system. IMPRESSION: 1. No evidence of acute or chronic DVT within either lower extremity. 2. Pulsatile venous flow demonstrated throughout the bilateral lower extremity venous systems, nonspecific though could be seen in the setting of right-sided heart failure - clinical correlation is advised. Electronically Signed   By: Sandi Mariscal M.D.   On: 08/09/2021 17:15   ECHOCARDIOGRAM COMPLETE  Result Date: 08/10/2021    ECHOCARDIOGRAM REPORT   Patient Name:   Henry Lindsey Date of  Exam: 08/10/2021 Medical Rec #:  [id number]     Height:       71.0 in Accession #:    [id number]    Weight:       188.0 lb Date of Birth:  1933-09-04     BSA:          2.054 m Patient Age:    70 years      BP:           102/64 mmHg Patient Gender: M             HR:           89 bpm. Exam Location:  [facility] Procedure: 2D Echo, Color Doppler, Cardiac Doppler and Strain Analysis Indications:     I50.21 congestive heart failure-Acute Systolic  History:         Patient has no prior history of Echocardiogram examinations.                  HFrEF, CAD, COPD, Stroke and PVD; Risk Factors:Diabetes,                  Hypertension and Dyslipidemia.  Sonographer:     Charmayne Sheer Referring Phys:  [place] Diagnosing Phys: Ida Rogue MD  Sonographer Comments: Global longitudinal strain was attempted. IMPRESSIONS  1. Left ventricular ejection fraction, by estimation, is 25 to 30%. The left ventricle has severely decreased function. The left ventricle demonstrates global hypokinesis with severe hypokinesis of the anterior, septal,  and apical region. Inferior wall best preserved. Left ventricular diastolic parameters are consistent with Grade II diastolic dysfunction (pseudonormalization). The average left ventricular global longitudinal strain is -6.3 %. The global longitudinal strain is abnormal.  2. Right ventricular systolic function is moderately reduced. The right ventricular size is moderately enlarged. There is mildly elevated pulmonary artery systolic pressure. The estimated right ventricular systolic pressure is 40.1 mmHg.  3. Left atrial size was moderately dilated.  4. The mitral valve is normal in structure. Moderate to severe mitral valve regurgitation.  5. Tricuspid valve regurgitation is mild to moderate.  6. Aortic dilatation noted. There is mild dilatation of the aortic root, measuring 39 mm.  7. The inferior vena cava is dilated in size with <50% respiratory variability, suggesting right atrial pressure  of 15 mmHg. FINDINGS  Left Ventricle: Left ventricular ejection fraction, by estimation, is 25 to 30%. The left ventricle has severely decreased function. The left ventricle demonstrates global hypokinesis. The average left ventricular global longitudinal strain is -6.3 %. The global longitudinal strain is abnormal. The left ventricular internal cavity size was normal in size. There is no left ventricular hypertrophy. Left ventricular diastolic parameters are consistent with Grade II diastolic dysfunction (pseudonormalization). Right Ventricle: The right ventricular size is moderately enlarged. No increase in right ventricular wall thickness. Right ventricular systolic function is moderately reduced. There is mildly elevated pulmonary artery systolic pressure. The tricuspid regurgitant velocity is 2.58 m/s, and with an assumed right atrial pressure of 15 mmHg, the estimated right ventricular systolic pressure is 02.7 mmHg. Left Atrium: Left atrial size was moderately dilated. Right Atrium: Right atrial size was normal in size. Pericardium: There is no evidence of pericardial effusion. Mitral Valve: The mitral valve is normal in structure. Moderate to severe mitral valve regurgitation, with eccentric posteriorly directed jet. No evidence of mitral valve stenosis. Tricuspid Valve: The tricuspid valve is normal in structure. Tricuspid valve regurgitation is mild to moderate. No evidence of tricuspid stenosis. Aortic Valve: The aortic valve was not well visualized. Aortic valve regurgitation is not visualized. Mild aortic valve sclerosis is present, with no evidence of aortic valve stenosis. Aortic valve mean gradient measures 4.0 mmHg. Aortic valve peak gradient measures 6.9 mmHg. Aortic valve area, by VTI measures 2.57 cm. Pulmonic Valve: The pulmonic valve was normal in structure. Pulmonic valve regurgitation is mild to moderate. No evidence of pulmonic stenosis. Aorta: Aortic dilatation noted. There is mild  dilatation of the aortic root, measuring 39 mm. Venous: The pulmonary veins were not well visualized. The inferior vena cava is dilated in size with less than 50% respiratory variability, suggesting right atrial pressure of 15 mmHg. IAS/Shunts: No atrial level shunt detected by color flow Doppler.  LEFT VENTRICLE PLAX 2D LVIDd:         5.50 cm      Diastology LVIDs:         4.30 cm      LV e' medial:    4.24 cm/s LV PW:         1.20 cm      LV E/e' medial:  25.1 LV IVS:        0.90 cm      LV e' lateral:   6.64 cm/s LVOT diam:     2.40 cm      LV E/e' lateral: 16.0 LV SV:         63 LV SV Index:   31           2D  Longitudinal Strain LVOT Area:     4.52 cm     2D Strain GLS Avg:     -6.3 %  LV Volumes (MOD) LV vol d, MOD A4C: 227.0 ml LV vol s, MOD A4C: 149.0 ml LV SV MOD A4C:     227.0 ml RIGHT VENTRICLE RV Basal diam:  4.60 cm RV S prime:     7.51 cm/s LEFT ATRIUM             Index        RIGHT ATRIUM           Index LA diam:        5.00 cm 2.43 cm/m   RA Area:     22.80 cm LA Vol (A2C):   67.9 ml 33.06 ml/m  RA Volume:   69.90 ml  34.03 ml/m LA Vol (A4C):   98.7 ml 48.06 ml/m LA Biplane Vol: 90.3 ml 43.97 ml/m  AORTIC VALVE                    PULMONIC VALVE AV Area (Vmax):    2.79 cm     PV Vmax:          0.91 m/s AV Area (Vmean):   2.64 cm     PV Vmean:         69.500 cm/s AV Area (VTI):     2.57 cm     PV VTI:           0.209 m AV Vmax:           131.00 cm/s  PV Peak grad:     3.3 mmHg AV Vmean:          95.300 cm/s  PV Mean grad:     2.0 mmHg AV VTI:            0.246 m      PR End Diast Vel: 5.66 msec AV Peak Grad:      6.9 mmHg AV Mean Grad:      4.0 mmHg LVOT Vmax:         80.90 cm/s LVOT Vmean:        55.600 cm/s LVOT VTI:          0.140 m LVOT/AV VTI ratio: 0.57  AORTA Ao Root diam: 3.90 cm MITRAL VALVE                TRICUSPID VALVE MV Area (PHT): 6.14 cm     TR Peak grad:   26.6 mmHg MV Decel Time: 124 msec     TR Vmax:        258.00 cm/s MV E velocity: 106.25 cm/s MV A velocity: 90.00 cm/s    SHUNTS MV E/A ratio:  1.18         Systemic VTI:  0.14 m                             Systemic Diam: 2.40 cm Ida Rogue MD Electronically signed by Ida Rogue MD Signature Date/Time: 08/10/2021/1:14:39 PM    Final    ASSESSMENT AND PLAN:   JAQUALYN JUDAY is a 87 y.o. male with medical history significant of sCHF with EF 35%, hypertension, hyperlipidemia, diabetes mellitus, COPD, stroke, GERD, PVD, (s/p for right PTA SFA), CAD, BPH, carotid artery stenosis, CKD 3, diverticulitis, who presents with shortness of breath.  Patient states that he has shortness of breath for almost  a week, which has been progressively worsening. Patient was found to have oxygen desaturation to 88% on room air, which improved to 99% on 4 L oxygen.  Patient is normally not wearing oxygen at home.  Acute respiratory failure with hypoxia due to acute on chronic systolic CHF:  --2D echo 2/54/9826 showed EF of 35%.   --came in with leg edema, positive JVD, elevated BNP > 4500, crackles on auscultation, clinically consistent with CHF exacerbation.   --Dr. Rockey Situ of cardiology is consulted due to elevated troponin -Lasix 80 mg bid per card --UOP document needs to be accurate -strict I/O's -Low salt diet -Fluid restriction --soft BP and CKD--cannot do ACE inhibitors --10/22--UOP 2030. Cont IV lasix   Elevated troponin and hx of CAD: trop 533 --> 580.  No chest pain, likely due to demand ischemia,with chf -IV heparin started in ED--cont for 48 hours per cards rec -Aspirin, Lipitor -As needed nitroglycerin   Hyperlipidemia -Lipitor   Hypertension -IV hydralazine as needed -Metoprolol   History of CVA (cerebrovascular accident) -Aspirin, Lipitor   Peripheral vascular disease in diabetes mellitus (HCC) -Cilostazol   BPH (benign prostatic hyperplasia) -Flomax   Type II diabetes mellitus with renal manifestations ([place]), well controlled -Sliding scale insulin -Decrease 70/30 insulin from 21 to 12 units bid    COPD (chronic obstructive pulmonary disease) ([place]): No wheezing or rhonchi. -Bronchodilators   CKD (chronic kidney disease), stage IIIa: Stable -Follow-up with BMP   Abnormal LFTs: Likely due to congestion secondary to CHF exacerbation -Avoid using Tylenol   assess for home oxygen PT/OT    Procedures:none Family communication left message for dter Candace Consults :[facility] cardiology CODE STATUS:DNR per d/w Dr Blaine Hamper  DVT Prophylaxis :heparin gtt Level of care: Progressive Cardiac Status is: Inpatient  Remains inpatient appropriate because: treatment for CHF        TOTAL TIME TAKING CARE OF THIS PATIENT: 25 minutes.  >50% time spent on counselling and coordination of care  Note: This dictation was prepared with Dragon dictation along with smaller phrase technology. Any transcriptional errors that result from this process are unintentional.  Fritzi Mandes M.D    [facility]   CC: Primary care physician; Baxter Hire, MD Patient ID: [name], male   DOB: Feb 28, 1933, 87 y.o.   MRN: [id number]

## 2021-08-11 NOTE — Evaluation (Signed)
Physical Therapy Evaluation Patient Details Name: Henry Lindsey MRN: [id number] DOB: [birth year] Today's Date: 08/11/2021  History of Present Illness  87 yo male with onset of SOB and hypoxia for a week was brought to hosp to admit 10/20.  Had productive cough, now clear.  Requires 4L O2 at admit, currently on 3L O2 with no prev use of it.  Cardiology dx acute CHF, ischemic cardiomyopathy with demand ischemia and elevated troponin, noted elevated LFT's from heart failure.   PMHx: CHF, EF 35%, HTN, HLD, DM, COPD, stroke, GERD, PVD, R PTA SFA, CAD, carotid artery stenosis, CKD 3, diverticulitis,  Clinical Impression  Pt was seen for mobility on side of bed and noted his sats being 78% baseline since pt had removed cannula with 3L O2.  Replaced cannula and was able to recover to 95%, although fluctuates with effort.  Pt is up to 93% sat with gait and standing work, and will recommend he have rehab setting to get him stronger  and more independent with gait on O2 as ordered.  Follow up with him to monitor his safety, to increase his strength in LE's and to increase both sitting and standing balance skills.  Pt is a fall risk and will need to figure out his support if he wants to go home.  The single caregiver may be too little coverage for home.     Recommendations for follow up therapy are one component of a multi-disciplinary discharge planning process, led by the attending physician.  Recommendations may be updated based on patient status, additional functional criteria and insurance authorization.  Follow Up Recommendations Supervision/Assistance - 24 hour;Supervision for mobility/OOB;SNF    Equipment Recommendations  None recommended by PT    Recommendations for Other Services       Precautions / Restrictions Precautions Precautions: Fall Precaution Comments: painful on skin breakdown L hip and sacrum, RLE Restrictions Weight Bearing Restrictions: No      Mobility  Bed Mobility Overal  bed mobility: Needs Assistance Bed Mobility: Supine to Sit;Sit to Supine     Supine to sit: Min assist Sit to supine: Min assist        Transfers Overall transfer level: Needs assistance Equipment used: Rolling walker (2 wheeled);1 person hand held assist Transfers: Sit to/from Stand Sit to Stand: Min assist         General transfer comment: min assist to steady him  Ambulation/Gait Ambulation/Gait assistance: Min guard Gait Distance (Feet): 20 Feet (10 x 2 sidestepping) Assistive device: Rolling walker (2 wheeled);1 person hand held assist Gait Pattern/deviations: Step-to pattern;Decreased stride length;Wide base of support Gait velocity: reduced   General Gait Details: monitored sats and pulses, able to maintain 90% with mobility and 3L O2.  On room air when PT arrived and satted 78%  Stairs            Wheelchair Mobility    Modified Rankin (Stroke Patients Only)       Balance Overall balance assessment: Needs assistance Sitting-balance support: Feet supported Sitting balance-Leahy Scale: Good Sitting balance - Comments: on O2 and supervised by PT   Standing balance support: Bilateral upper extremity supported;During functional activity Standing balance-Leahy Scale: Fair Standing balance comment: less than fair dynamically                             Pertinent Vitals/Pain      Home Living Family/patient expects to be discharged to:: Private residence Living Arrangements: Alone  Available Help at Discharge: Family;Friend(s);Available 24 hours/day Type of Home: House Home Access: Level entry     Home Layout: One level Home Equipment: Walker - 2 wheels;Crutches;Bedside commode;Grab bars - toilet Additional Comments: has equipment and assistance    Prior Function Level of Independence: Independent with assistive device(s)         Comments: has help at home 24/7 per pt     Hand Dominance   Dominant Hand: Right     Extremity/Trunk Assessment   Upper Extremity Assessment Upper Extremity Assessment: Overall WFL for tasks assessed    Lower Extremity Assessment Lower Extremity Assessment: Generalized weakness    Cervical / Trunk Assessment Cervical / Trunk Assessment: Kyphotic  Communication   Communication: No difficulties  Cognition Arousal/Alertness: Awake/alert Behavior During Therapy: WFL for tasks assessed/performed Overall Cognitive Status: Within Functional Limits for tasks assessed                                        General Comments General comments (skin integrity, edema, etc.): Pt is up to walk with help and with supplmental O2 maintains sats, but HR is reacting up to 110.    Exercises     Assessment/Plan    PT Assessment Patient needs continued PT services  PT Problem List Decreased strength;Decreased activity tolerance;Decreased balance;Decreased coordination;Decreased knowledge of use of DME;Decreased safety awareness;Cardiopulmonary status limiting activity       PT Treatment Interventions DME instruction;Gait training;Functional mobility training;Therapeutic activities;Therapeutic exercise;Balance training;Neuromuscular re-education;Patient/family education    PT Goals (Current goals can be found in the Care Plan section)  Acute Rehab PT Goals Patient Stated Goal: to get home with help PT Goal Formulation: With patient Time For Goal Achievement: 08/25/21 Potential to Achieve Goals: Good    Frequency Min 2X/week   Barriers to discharge   has girlfriend at home and level entrance    Co-evaluation               AM-PAC PT "6 Clicks" Mobility  Outcome Measure Help needed turning from your back to your side while in a flat bed without using bedrails?: None Help needed moving from lying on your back to sitting on the side of a flat bed without using bedrails?: A Little Help needed moving to and from a bed to a chair (including a wheelchair)?:  A Little Help needed standing up from a chair using your arms (e.g., wheelchair or bedside chair)?: A Little Help needed to walk in hospital room?: A Little Help needed climbing 3-5 steps with a railing? : A Lot 6 Click Score: 18    End of Session Equipment Utilized During Treatment: Gait belt;Oxygen Activity Tolerance: Patient tolerated treatment well;Patient limited by fatigue Patient left: in bed;with call bell/phone within reach;with bed alarm set Nurse Communication: Mobility status PT Visit Diagnosis: Unsteadiness on feet (R26.81);Muscle weakness (generalized) (M62.81);Difficulty in walking, not elsewhere classified (R26.2)    Time: 1601-0932 PT Time Calculation (min) (ACUTE ONLY): 33 min   Charges:   PT Evaluation $PT Eval Moderate Complexity: 1 Mod PT Treatments $Gait Training: 8-22 mins       Ramond Dial 08/11/2021, 4:33 PM  Mee Hives, PT PhD Acute Rehab Dept. Number: [place] [facility] [place]

## 2021-08-11 NOTE — Consult Note (Signed)
ANTICOAGULATION CONSULT NOTE  Pharmacy Consult for heparin  Indication: chest pain/ACS   Patient Measurements: Height: 5\' 11"  (180.3 cm) Weight: 85.3 kg (188 lb) IBW/kg (Calculated) : 75.3  Vital Signs: Temp: 97.7 F (36.5 C) (10/22 0021) Temp Source: Oral (10/21 1950) BP: 106/79 (10/22 0021) Pulse Rate: 91 (10/22 0021)  Labs: Recent Labs    08/09/21 1231 08/09/21 1231 08/09/21 1409 08/09/21 1658 08/09/21 2151 08/10/21 0643 08/10/21 0851 08/10/21 1705 08/11/21 0301  HGB 13.1  --   --   --   --  11.9*  --   --  11.6*  HCT 41.3  --   --   --   --  37.6*  --   --  38.1*  PLT 245  --   --   --   --  236  --   --  247  APTT  --   --  37*  --   --   --   --   --   --   LABPROT  --   --  20.9*  --   --   --   --   --   --   INR  --   --  1.8*  --   --   --   --   --   --   HEPARINUNFRC  --    < >  --   --  0.16*  --  0.31 0.22* 0.41  CREATININE 1.31*  --   --   --   --  1.40*  --   --   --   TROPONINIHS 533*  --  580* 816* 1,183* 1,076*  --   --   --    < > = values in this interval not displayed.     Estimated Creatinine Clearance: 40.3 mL/min (A) (by C-G formula based on SCr of 1.4 mg/dL (H)).   Medical History: Past Medical History:  Diagnosis Date   Arthritis    BPH (benign prostatic hyperplasia)    CAD (coronary artery disease)    a. 11/2020 Cath: LM 50d, LAD sev ost dzs, mod to sev prox/mid dzs. D1 sev dzs. LCX mild prox dzs, OM2 mod-sev dzs, RCA 100p. RPL and RPDA fill via L-->R collats. EF 35%. Seen by CT surgery-->Med Rx advised.   Carotid artery stenosis    a. 11/2010 U/S: <50% bilaterally; b. 03/2021 40-59% bilat ICA stenoses.   COPD (chronic obstructive pulmonary disease) ([place])    noted CT 10/05/16 former smoker quit age 23    CVA (cerebral infarction) 11/2010   r thalamic lacunar   Diabetes mellitus    GERD (gastroesophageal reflux disease)    HFrEF (heart failure with reduced ejection fraction) ([place])    a. 10/2020 Echo: EF 35%.   Hyperlipidemia     Hypertension    Ischemic cardiomyopathy    a. 10/2020 Echo: EF 35%. Nl RVSP. Mod MR. Mild TR; b. 11/2020 Cath:  CO/CI 5.05/2.49. LV gram: EF 35%.   Lower extremity cellulitis    Neuromuscular disorder ([place])    Peripheral vascular disease in diabetes mellitus ([place])    a. 06/2012 95% occlusion s/p PTA R SFA (Dr. Lucky Cowboy); b. 03/2021 ABIs: R 1.02, L 0.96.   Stroke [facility])     Medications:  Scheduled:   ascorbic acid  500 mg Oral Daily   aspirin EC  81 mg Oral Daily   atorvastatin  40 mg Oral Daily   cholecalciferol  1,000 Units Oral Daily  cholestyramine  4 g Oral TID WC   furosemide  80 mg Intravenous BID   gabapentin  400 mg Oral BID   insulin aspart  0-5 Units Subcutaneous QHS   insulin aspart  0-9 Units Subcutaneous TID WC   insulin aspart protamine- aspart  12 Units Subcutaneous BID WC   ipratropium-albuterol  3 mL Nebulization Q6H   latanoprost  2 drop Both Eyes QHS   metoprolol succinate  100 mg Oral Daily   multivitamin with minerals  1 tablet Oral Daily   pantoprazole  40 mg Oral Daily   silver sulfADIAZINE   Topical Daily   tamsulosin  0.4 mg Oral QPC breakfast   timolol  2 drop Both Eyes Daily   vitamin B-12  500 mcg Oral Daily   vitamin E  400 Units Oral Daily    Assessment: 87yo M with PMH of CAD, COPD, CVA, DM, GERD, HFrEF, HLD, HTN, PVD who was admitted to the hospital for respiratory distress. Pt was found to have elevated troponin. Pharmacy was consulted for heparin dosing.   Baseline CBC was within normal limits   No PTA anticoagulants noted on pt chart   Date/time  HL Interpretation/comment 10/20 2151 0.16 Subthera at 1100 units/hr 10/21 0851 0.31 Thera x1, at 1400 units/hr 10/21 1705 0.22 Subthera at 1400 units/hr 10/22 0301 0.41 Therapeutic  Goal of Therapy:  Heparin level 0.3-0.7 units/ml Monitor platelets by anticoagulation protocol: Yes   Plan:  Continue infusion rate at 1550units/hr Recheck HL level in 8 hours to confirm Continue to monitor H&H and  platelets with daily CBC while on heparin gtt   Renda Rolls, PharmD, [facility] 08/11/2021 4:38 AM

## 2021-08-11 NOTE — Progress Notes (Signed)
Progress Note  Patient Name: Henry Lindsey Date of Encounter: 08/11/2021  Primary Cardiologist: Ida Rogue, MD  Subjective   Breathing improving.  Not quite back to baseline.  Feels that he has had good urine output.  Inpatient Medications    Scheduled Meds:  ascorbic acid  500 mg Oral Daily   aspirin EC  81 mg Oral Daily   atorvastatin  40 mg Oral Daily   cholecalciferol  1,000 Units Oral Daily   cholestyramine  4 g Oral TID WC   furosemide  80 mg Intravenous BID   gabapentin  400 mg Oral BID   insulin aspart  0-5 Units Subcutaneous QHS   insulin aspart  0-9 Units Subcutaneous TID WC   insulin aspart protamine- aspart  12 Units Subcutaneous BID WC   latanoprost  2 drop Both Eyes QHS   mouth rinse  15 mL Mouth Rinse BID   metoprolol succinate  100 mg Oral Daily   multivitamin with minerals  1 tablet Oral Daily   pantoprazole  40 mg Oral Daily   silver sulfADIAZINE   Topical Daily   tamsulosin  0.4 mg Oral QPC breakfast   timolol  2 drop Both Eyes Daily   vitamin B-12  500 mcg Oral Daily   vitamin E  400 Units Oral Daily   Continuous Infusions:  heparin 1,550 Units/hr (08/10/21 1816)   PRN Meds: albuterol, dextromethorphan-guaiFENesin, hydrALAZINE, HYDROcodone-acetaminophen, nitroGLYCERIN, ondansetron (ZOFRAN) IV, traZODone   Vital Signs    Vitals:   08/11/21 0516 08/11/21 0524 08/11/21 0715 08/11/21 0742  BP: 110/69   117/69  Pulse: 86   79  Resp: 18   18  Temp: 97.7 F (36.5 C)   98.3 F (36.8 C)  TempSrc:      SpO2: 93%  95% 98%  Weight:  89.9 kg    Height:        Intake/Output Summary (Last 24 hours) at 08/11/2021 1008 Last data filed at 08/11/2021 0300 Gross per 24 hour  Intake 250 ml  Output 1000 ml  Net -750 ml   Filed Weights   08/09/21 1228 08/11/21 0524  Weight: 85.3 kg 89.9 kg    Physical Exam   GEN: Well nourished, well developed, in no acute distress.  HEENT: Grossly normal.  Neck: Supple, JVD to jaw.  No carotid bruits, or  masses. Cardiac: RRR, distant, no murmurs, rubs, or gallops. No clubbing, cyanosis, 1+ bilateral lower extremity edema.  Radials 2+, DP/PT 1+ and equal bilaterally.  Respiratory:  Respirations regular and unlabored, diminished breath sounds at the right base, left basilar crackles. GI: Soft, nontender, nondistended, BS + x 4. MS: no deformity or atrophy. Skin: warm and dry, no rash. Neuro:  Strength and sensation are intact. Psych: AAOx3.  Normal affect.  Labs    Chemistry Recent Labs  Lab 08/09/21 1231 08/10/21 0643  NA 137 137  K 4.6 4.1  CL 99 98  CO2 26 26  GLUCOSE 79 120*  BUN 26* 28*  CREATININE 1.31* 1.40*  CALCIUM 8.7* 8.6*  PROT 7.3  --   ALBUMIN 2.8*  --   AST 118*  --   ALT 59*  --   ALKPHOS 188*  --   BILITOT 3.3*  --   GFRNONAA 53* 49*  ANIONGAP 12 13     Hematology Recent Labs  Lab 08/09/21 1231 08/10/21 0643 08/11/21 0301  WBC 8.1 6.6 7.5  RBC 5.10 4.64 4.72  HGB 13.1 11.9* 11.6*  HCT 41.3  37.6* 38.1*  MCV 81.0 81.0 80.7  MCH 25.7* 25.6* 24.6*  MCHC 31.7 31.6 30.4  RDW 19.2* 19.1* 18.9*  PLT 245 236 247    Cardiac Enzymes  Recent Labs  Lab 08/09/21 1231 08/09/21 1409 08/09/21 1658 08/09/21 2151 08/10/21 0643  TROPONINIHS 533* 580* 816* 1,183* 1,076*      BNP Recent Labs  Lab 08/09/21 1231  BNP >4,500.0*   Lipids  Lab Results  Component Value Date   CHOL 53 08/10/2021   HDL 13 (L) 08/10/2021   LDLCALC 29 08/10/2021   LDLDIRECT 62.0 10/04/2016   TRIG 54 08/10/2021   CHOLHDL 4.1 08/10/2021    HbA1c  Lab Results  Component Value Date   HGBA1C 8.1 (H) 08/09/2021    Radiology    DG Chest 2 View  Result Date: 08/09/2021 CLINICAL DATA:  Shortness of breath. EXAM: CHEST - 2 VIEW COMPARISON:  Chest x-ray dated December 05, 2020. FINDINGS: Stable cardiomegaly. Normal pulmonary vascularity. Increasing small right and unchanged trace left pleural effusions. Increased atelectasis at the right lung base. No pneumothorax. No  acute osseous abnormality. IMPRESSION: 1. Increasing small right and unchanged trace left pleural effusions. Electronically Signed   By: Titus Dubin M.D.   On: 08/09/2021 13:38   US Venous Img Lower Bilateral (DVT)  Result Date: 08/09/2021 CLINICAL DATA:  Bilateral lower extremity edema. History of previous right lower extremity DVT. Evaluate for acute or chronic DVT. EXAM: BILATERAL LOWER EXTREMITY VENOUS DOPPLER ULTRASOUND TECHNIQUE: Gray-scale sonography with graded compression, as well as color Doppler and duplex ultrasound were performed to evaluate the lower extremity deep venous systems from the level of the common femoral vein and including the common femoral, femoral, profunda femoral, popliteal and calf veins including the posterior tibial, peroneal and gastrocnemius veins when visible. The superficial great saphenous vein was also interrogated. Spectral Doppler was utilized to evaluate flow at rest and with distal augmentation maneuvers in the common femoral, femoral and popliteal veins. COMPARISON:  None. FINDINGS: RIGHT LOWER EXTREMITY Common Femoral Vein: No evidence of thrombus. Normal compressibility, respiratory phasicity and response to augmentation. Saphenofemoral Junction: No evidence of thrombus. Normal compressibility and flow on color Doppler imaging. Profunda Femoral Vein: No evidence of thrombus. Normal compressibility and flow on color Doppler imaging. Femoral Vein: No evidence of thrombus. Normal compressibility, respiratory phasicity and response to augmentation. Popliteal Vein: No evidence of thrombus. Normal compressibility, respiratory phasicity and response to augmentation. Calf Veins: No evidence of thrombus. Normal compressibility and flow on color Doppler imaging. Superficial Great Saphenous Vein: No evidence of thrombus. Normal compressibility. Venous Reflux:  None. Other Findings: Pulsatile venous flow is demonstrated throughout near the entirety of the right lower  extremity venous system. LEFT LOWER EXTREMITY Common Femoral Vein: No evidence of thrombus. Normal compressibility, respiratory phasicity and response to augmentation. Saphenofemoral Junction: No evidence of thrombus. Normal compressibility and flow on color Doppler imaging. Profunda Femoral Vein: No evidence of thrombus. Normal compressibility and flow on color Doppler imaging. Femoral Vein: No evidence of thrombus. Normal compressibility, respiratory phasicity and response to augmentation. Popliteal Vein: No evidence of thrombus. Normal compressibility, respiratory phasicity and response to augmentation. Calf Veins: No evidence of thrombus. Normal compressibility and flow on color Doppler imaging. Superficial Great Saphenous Vein: No evidence of thrombus. Normal compressibility. Venous Reflux:  None. Other Findings: Pulsatile flow is demonstrated throughout near the entirety of the left lower extremity venous system. IMPRESSION: 1. No evidence of acute or chronic DVT within either lower extremity. 2. Pulsatile venous  flow demonstrated throughout the bilateral lower extremity venous systems, nonspecific though could be seen in the setting of right-sided heart failure - clinical correlation is advised. Electronically Signed   By: Sandi Mariscal M.D.   On: 08/09/2021 17:15   Telemetry    RSR, PAC/PVCs.  Occasional ventricular couplets and up to 3 beats of nonsustained VT- Personally Reviewed  Cardiac Studies   2D Echocardiogram 10.21.2022   1. Left ventricular ejection fraction, by estimation, is 25 to 30%. The  left ventricle has severely decreased function. The left ventricle  demonstrates global hypokinesis with severe hypokinesis of the anterior,  septal, and apical region. Inferior wall  best preserved. Left ventricular diastolic parameters are consistent with  Grade II diastolic dysfunction (pseudonormalization). The average left  ventricular global longitudinal strain is -6.3 %. The global  longitudinal  strain is abnormal.   2. Right ventricular systolic function is moderately reduced. The right  ventricular size is moderately enlarged. There is mildly elevated  pulmonary artery systolic pressure. The estimated right ventricular  systolic pressure is 26.3 mmHg.   3. Left atrial size was moderately dilated.   4. The mitral valve is normal in structure. Moderate to severe mitral  valve regurgitation.   5. Tricuspid valve regurgitation is mild to moderate.   6. Aortic dilatation noted. There is mild dilatation of the aortic root,  measuring 39 mm.   7. The inferior vena cava is dilated in size with <50% respiratory  variability, suggesting right atrial pressure of 15 mmHg.   Patient Profile     87 y.o. male with a history of CAD, ICM, HFrEF, HTN, HL, DMII, GERD, CVA, PAD, and carotid arterial dzs, who presented October 19 due to progressive dyspnea, increasing abdominal girth, lower extremity edema.  Elevated troponin to 1183.  EF 25 to 30% with global hypokinesis and severe anterior, septal, and apical hypokinesis on echo this admission.  Assessment & Plan    1.  Acute on chronic heart failure with reduced ejection fraction/ischemic cardiomyopathy: EF 35% by echo earlier this year with known moderate multivessel CAD and occluded RCA by cath February 2022.  Was doing well until August 06, 2019 started noticing increased dyspnea exertion followed by increasing abdominal girth, orthopnea, lower extremity edema, and early satiety.  Admitted October 19 with BNP greater than 4500 and hypoxia.  Echo dismission with an EF of 25 to 30% with global hypokinesis and more severe hypokinesis of the anterior, septal, and apical regions.  Grade 2 diastolic dysfunction also noted.  Respiratory status is improved with intravenous Lasix, though he does not feel he is back to baseline.  I's and O's are inaccurate as he was in the ED for several days.  His weight this morning was 89.9 kg (admission  weight was self-reported at 85.3 and is not likely accurate as he had not weighed himself for over a week).  He remains volume overloaded on examination.  Basic metabolic panel is pending this morning.  Provided that renal function stable, would plan to continue aggressive IV diuresis, though recognize that we may need to consider inotropic support and potentially right heart catheterization if he develops worsening cardiorenal syndrome.  Continue beta-blocker.  Historically poor candidate for ACE/ARB/Entresto/MRA secondary to relative hypotension.  No SGLT2 inhibitor secondary to CKD.  2.  Demand ischemia/CAD: In setting of above, he has had troponin elevation of 1183.  As above, status post catheterization in February 2022 showing an occluded RCA with moderate left main/LAD/D1/OM 2 disease.  He  was seen by CT surgery at that time with recommendation for medical therapy.  He is not felt to be an ideal candidate for PCI given diffuse nature of left main/proximal LAD disease and concern regarding aortic atherosclerosis which would make him a poor candidate for mechanical support.  He is not having chest pain.  Continue aspirin, statin, and beta-blocker therapy.  He has completed 48 hours of heparin and this can be discontinued.  3.  Essential hypertension: He has a history of relative hypotension though pressures are steady at 110-117 this morning.  Continue beta-blocker, and follow with diuresis.  4.  Hyperlipidemia: Continue statin therapy.  Follow-up LFTs in the a.m. given elevation on admission in setting of heart failure.  5.  Type 2 diabetes mellitus: A1c 6.7.  Per medicine team.  6.  Peripheral arterial disease: No recent claudication.  Pletal discontinued and should not be resumed given heart failure.  7.  Stage III chronic kidney disease: Basic metabolic panel pending this morning.  8.  Elevated LFTs: Likely secondary heart failure.  Follow-up in a.m.  Signed, Murray Hodgkins, NP   08/11/2021, 10:08 AM    For questions or updates, please contact   Please consult [email or web address] for contact info under Cardiology/STEMI.

## 2021-08-12 DIAGNOSIS — J9601 Acute respiratory failure with hypoxia: Secondary | ICD-10-CM | POA: Diagnosis not present

## 2021-08-12 DIAGNOSIS — I214 Non-ST elevation (NSTEMI) myocardial infarction: Secondary | ICD-10-CM | POA: Diagnosis not present

## 2021-08-12 DIAGNOSIS — I5023 Acute on chronic systolic (congestive) heart failure: Secondary | ICD-10-CM | POA: Diagnosis not present

## 2021-08-12 DIAGNOSIS — R600 Localized edema: Secondary | ICD-10-CM | POA: Diagnosis not present

## 2021-08-12 LAB — COMPREHENSIVE METABOLIC PANEL
ALT: 52 U/L — ABNORMAL HIGH (ref 0–44)
AST: 70 U/L — ABNORMAL HIGH (ref 15–41)
Albumin: 2.5 g/dL — ABNORMAL LOW (ref 3.5–5.0)
Alkaline Phosphatase: 145 U/L — ABNORMAL HIGH (ref 38–126)
Anion gap: 11 (ref 5–15)
BUN: 24 mg/dL — ABNORMAL HIGH (ref 8–23)
CO2: 28 mmol/L (ref 22–32)
Calcium: 8.3 mg/dL — ABNORMAL LOW (ref 8.9–10.3)
Chloride: 97 mmol/L — ABNORMAL LOW (ref 98–111)
Creatinine, Ser: 1.26 mg/dL — ABNORMAL HIGH (ref 0.61–1.24)
GFR, Estimated: 56 mL/min — ABNORMAL LOW (ref >60)
Glucose, Bld: 131 mg/dL — ABNORMAL HIGH (ref 70–99)
Potassium: 3.7 mmol/L (ref 3.5–5.1)
Sodium: 136 mmol/L (ref 135–145)
Total Bilirubin: 1.7 mg/dL — ABNORMAL HIGH (ref 0.3–1.2)
Total Protein: 7.2 g/dL (ref 6.5–8.1)

## 2021-08-12 LAB — GLUCOSE, CAPILLARY
Glucose-Capillary: 134 mg/dL — ABNORMAL HIGH (ref 70–99)
Glucose-Capillary: 144 mg/dL — ABNORMAL HIGH (ref 70–99)
Glucose-Capillary: 162 mg/dL — ABNORMAL HIGH (ref 70–99)
Glucose-Capillary: 179 mg/dL — ABNORMAL HIGH (ref 70–99)

## 2021-08-12 MED ORDER — INSULIN ASPART PROT & ASPART (70-30 MIX) 100 UNIT/ML ~~LOC~~ SUSP
15.0000 [IU] | Freq: Two times a day (BID) | SUBCUTANEOUS | Status: DC
  Administered 2021-08-12 – 2021-08-14 (×4): 15 [IU] via SUBCUTANEOUS
  Filled 2021-08-12: qty 10

## 2021-08-12 NOTE — Progress Notes (Signed)
Progress Note  Patient Name: Henry Lindsey Date of Encounter: 08/12/2021  [facility] Cardiologist: Ida Rogue, MD   Subjective   Patient's wife at the bedside He reports continued shortness of breath, very winded getting up to go to the bathroom Feels exhausted, laying back in bed Continues on IV Lasix twice daily  Inpatient Medications    Scheduled Meds:  ascorbic acid  500 mg Oral Daily   aspirin EC  81 mg Oral Daily   atorvastatin  40 mg Oral Daily   cholecalciferol  1,000 Units Oral Daily   cholestyramine  4 g Oral TID WC   furosemide  80 mg Intravenous BID   gabapentin  400 mg Oral BID   heparin  5,000 Units Subcutaneous Q8H   insulin aspart  0-5 Units Subcutaneous QHS   insulin aspart  0-9 Units Subcutaneous TID WC   insulin aspart protamine- aspart  15 Units Subcutaneous BID WC   latanoprost  2 drop Both Eyes QHS   mouth rinse  15 mL Mouth Rinse BID   metoprolol succinate  100 mg Oral Daily   multivitamin with minerals  1 tablet Oral Daily   pantoprazole  40 mg Oral Daily   silver sulfADIAZINE   Topical Daily   tamsulosin  0.4 mg Oral QPC breakfast   timolol  2 drop Both Eyes Daily   vitamin B-12  500 mcg Oral Daily   vitamin E  400 Units Oral Daily   Continuous Infusions:  PRN Meds: albuterol, dextromethorphan-guaiFENesin, hydrALAZINE, HYDROcodone-acetaminophen, nitroGLYCERIN, ondansetron (ZOFRAN) IV, traZODone   Vital Signs    Vitals:   08/12/21 0435 08/12/21 0514 08/12/21 0740 08/12/21 1124  BP: 111/73  118/76 104/77  Pulse: 73  (!) 109 80  Resp:   18 18  Temp: 97.7 F (36.5 C)  97.9 F (36.6 C) (!) 97.5 F (36.4 C)  TempSrc:    Oral  SpO2: 100%  97% 97%  Weight:  91.5 kg    Height:        Intake/Output Summary (Last 24 hours) at 08/12/2021 1405 Last data filed at 08/12/2021 0514 Gross per 24 hour  Intake --  Output 1600 ml  Net -1600 ml   Last 3 Weights 08/12/2021 08/11/2021 08/09/2021  Weight (lbs) 201 lb 11.5 oz 198 lb 3.2  oz 188 lb  Weight (kg) 91.5 kg 89.903 kg 85.276 kg      Telemetry    Normal sinus rhythm- Personally Reviewed  ECG     - Personally Reviewed  Physical Exam   GEN: No acute distress.   Neck: No JVD Cardiac: RRR, no murmurs, rubs, or gallops.  1+ lower extremity edema Respiratory: Clear to auscultation bilaterally. GI: Soft, nontender, distended MS: No edema; No deformity. Neuro:  Nonfocal  Psych: Normal affect   Labs    High Sensitivity Troponin:   Recent Labs  Lab 08/09/21 1231 08/09/21 1409 08/09/21 1658 08/09/21 2151 08/10/21 0643  TROPONINIHS 533* 580* 816* 1,183* 1,076*     Chemistry Recent Labs  Lab 08/09/21 1231 08/10/21 0643 08/11/21 1111 08/12/21 0617  NA 137 137 132* 136  K 4.6 4.1 3.7 3.7  CL 99 98 97* 97*  CO2 26 26 25 28   GLUCOSE 79 120* 156* 131*  BUN 26* 28* 27* 24*  CREATININE 1.31* 1.40* 1.32* 1.26*  CALCIUM 8.7* 8.6* 8.3* 8.3*  MG 1.8 1.9  --   --   PROT 7.3  --   --  7.2  ALBUMIN 2.8*  --   --  2.5*  AST 118*  --   --  70*  ALT 59*  --   --  52*  ALKPHOS 188*  --   --  145*  BILITOT 3.3*  --   --  1.7*  GFRNONAA 53* 49* 53* 56*  ANIONGAP 12 13 10 11     Lipids  Recent Labs  Lab 08/10/21 0643  CHOL 53  TRIG 54  HDL 13*  LDLCALC 29  CHOLHDL 4.1    Hematology Recent Labs  Lab 08/10/21 0643 08/11/21 0301 08/11/21 1111  WBC 6.6 7.5 7.3  RBC 4.64 4.72 4.97  HGB 11.9* 11.6* 12.1*  HCT 37.6* 38.1* 40.0  MCV 81.0 80.7 80.5  MCH 25.6* 24.6* 24.3*  MCHC 31.6 30.4 30.3  RDW 19.1* 18.9* 19.2*  PLT 236 247 270   Thyroid No results for input(s): TSH, FREET4 in the last 168 hours.  BNP Recent Labs  Lab 08/09/21 1231  BNP >4,500.0*    DDimer No results for input(s): DDIMER in the last 168 hours.   Radiology    No results found.  Cardiac Studies   Echocardiogram   1. Left ventricular ejection fraction, by estimation, is 25 to 30%. The  left ventricle has severely decreased function. The left ventricle   demonstrates global hypokinesis with severe hypokinesis of the anterior,  septal, and apical region. Inferior wall  best preserved. Left ventricular diastolic parameters are consistent with  Grade II diastolic dysfunction (pseudonormalization). The average left  ventricular global longitudinal strain is -6.3 %. The global longitudinal  strain is abnormal.   2. Right ventricular systolic function is moderately reduced. The right  ventricular size is moderately enlarged. There is mildly elevated  pulmonary artery systolic pressure. The estimated right ventricular  systolic pressure is 19.4 mmHg.   3. Left atrial size was moderately dilated.   4. The mitral valve is normal in structure. Moderate to severe mitral  valve regurgitation.   5. Tricuspid valve regurgitation is mild to moderate.   6. Aortic dilatation noted. There is mild dilatation of the aortic root,  measuring 39 mm.   7. The inferior vena cava is dilated in size with <50% respiratory  variability, suggesting right atrial pressure of 15 mmHg.   Patient Profile     87 y.o. male with a history of CAD, ICM, HFrEF, HTN, HL, DMII, GERD, CVA, PAD, and carotid arterial dzs, who presented October 19 due to progressive dyspnea, increasing abdominal girth, lower extremity edema.  Elevated troponin to 1183.  EF 25 to 30% with global hypokinesis and severe anterior, septal, and apical hypokinesis on echo this admission.  Assessment & Plan    1.  Acute on chronic heart failure with reduced ejection fraction/ischemic cardiomyopathy:  EF 35% by echo earlier this year with known moderate multivessel CAD and occluded RCA by cath February 2022.   -- Presenting with acute on chronic diastolic and systolic CHF  Echo this admission has decreased, EF of 25 to 30% with global hypokinesis and more severe hypokinesis of the anterior, septal, and apical regions.   -- Case discussed with interventional cardiology, poor candidate for high risk stenting,  medical management has been recommended --Still significantly fluid overloaded, cardiorenal syndrome with slow improvement in renal function and urine output -Suspect quite a few days away from euvolemic state -We will continue Lasix IV twice daily, close monitoring of renal function.  -Continue beta-blocker Low blood pressure limiting initiation of ACE/ARB/Entresto/MRA  -Consider consult to palliative given not a candidate for  underlying ischemic cardiomyopathy -The above discussed with patient and patient's wife at the bedside Patient's wife very concerned about him going home before he is able to breathe better   2.  Demand ischemia/CAD:   troponin elevation of 1183.   status post catheterization in February 2022 showing an occluded RCA with moderate left main/LAD/D1/OM 2 disease.   CT surgery has previously recommended medical therapy Interventional cardiology has felt he is not an ideal candidate for PCI given diffuse nature of left main/proximal LAD disease and concern regarding aortic atherosclerosis ,poor candidate for mechanical support.  Currently denies angina, though having worsening heart failure symptoms -Continue medical management for now with aggressive diuresis aspirin, statin, and beta-blocker therapy.    3.  Hyperlipidemia:  Continue statin therapy.    4   Type 2 diabetes mellitus:  A1c 6.7.  Per medicine team.  5.  Peripheral arterial disease:  Aspirin statin  6.  Stage III chronic kidney disease:  Cardiorenal syndrome Monitoring closely with aggressive diuresis  7.  Elevated LFTs:  Possibly secondary to heart failure.   Long discussion with patient and patient's wife concerning his underlying cardiomyopathy, limitations on care and that we would pursue further diuresis for symptom relief  Total encounter time more than 35 minutes  Greater than 50% was spent in counseling and coordination of care with the patient     For questions or updates, please  contact [place] Please consult [email or web address] for contact info under        Signed, Ida Rogue, MD  08/12/2021, 2:05 PM

## 2021-08-12 NOTE — Progress Notes (Signed)
[facility] Lindsey at [place] NAME: Henry Lindsey    MR#:  [id number]  DATE OF BIRTH:  [birth year]  SUBJECTIVE:   came in with increasing shortness of breath will prove congestive heart failure bilaterally reported denies chest pain. No family at bedside. Now off IV heparin drip.  Breathing better Good uop so far REVIEW OF SYSTEMS:   Review of Systems  Constitutional:  Negative for chills, fever and weight loss.  HENT:  Negative for ear discharge, ear pain and nosebleeds.   Eyes:  Negative for blurred vision, pain and discharge.  Respiratory:  Positive for shortness of breath. Negative for sputum production, wheezing and stridor.   Cardiovascular:  Negative for chest pain, palpitations, orthopnea and PND.  Gastrointestinal:  Negative for abdominal pain, diarrhea, nausea and vomiting.  Genitourinary:  Negative for frequency and urgency.  Musculoskeletal:  Negative for back pain and joint pain.  Neurological:  Positive for weakness. Negative for sensory change, speech change and focal weakness.  Psychiatric/Behavioral:  Negative for depression and hallucinations. The patient is not nervous/anxious.   Tolerating Diet:yes Tolerating PT: No PT needs  DRUG ALLERGIES:   Allergies  Allergen Reactions  . Jardiance [Empagliflozin]     Rash all over and lips turn purple.   . Metformin And Related Diarrhea  . Trazodone Other (See Comments)    Extreme hallucinations/ psychosis     VITALS:  Blood pressure 104/77, pulse 80, temperature (!) 97.5 F (36.4 C), temperature source Oral, resp. rate 18, height 5\' 11"  (1.803 m), weight 91.5 kg, SpO2 97 %.  PHYSICAL EXAMINATION:   Physical Exam  GENERAL:  87 y.o.-year-old patient lying in the bed with no acute distress.  LUNGS: Normal breath sounds bilaterally, no wheezing, rales, rhonchi. No use of accessory muscles of respiration.  CARDIOVASCULAR: S1, S2 normal. No murmurs, rubs, or gallops.  ABDOMEN: Soft,  nontender, nondistended. Bowel sounds present. No organomegaly or mass.  EXTREMITIES: + edema b/l.    NEUROLOGIC: non-focal PSYCHIATRIC:  patient is alert SKIN: No obvious rash, lesion, or ulcer.   LABORATORY PANEL:  CBC Recent Labs  Lab 08/11/21 1111  WBC 7.3  HGB 12.1*  HCT 40.0  PLT 270     Chemistries  Recent Labs  Lab 08/10/21 0643 08/11/21 1111 08/12/21 0617  NA 137   < > 136  K 4.1   < > 3.7  CL 98   < > 97*  CO2 26   < > 28  GLUCOSE 120*   < > 131*  BUN 28*   < > 24*  CREATININE 1.40*   < > 1.26*  CALCIUM 8.6*   < > 8.3*  MG 1.9  --   --   AST  --   --  70*  ALT  --   --  52*  ALKPHOS  --   --  145*  BILITOT  --   --  1.7*   < > = values in this interval not displayed.    Cardiac Enzymes No results for input(s): TROPONINI in the last 168 hours. RADIOLOGY:  No results found. ASSESSMENT AND PLAN:   Henry Lindsey is a 87 y.o. male with medical history significant of sCHF with EF 35%, hypertension, hyperlipidemia, diabetes mellitus, COPD, stroke, GERD, PVD, (s/p for right PTA SFA), CAD, BPH, carotid artery stenosis, CKD 3, diverticulitis, who presents with shortness of breath.  Patient states that he has shortness of breath for almost a week, which  has been progressively worsening. Patient was found to have oxygen desaturation to 88% on room air, which improved to 99% on 4 L oxygen.  Patient is normally not wearing oxygen at home.  Acute respiratory failure with hypoxia due to acute on chronic systolic CHF:  --2D echo 1/82/9937 showed EF of 35%.   --came in with leg edema, positive JVD, elevated BNP > 4500, crackles on auscultation, clinically consistent with CHF exacerbation.   --Dr. Rockey Situ of cardiology is consulted due to elevated troponin -Lasix 80 mg bid per card --UOP document needs to be accurate -strict I/O's -Low salt diet -Fluid restriction --soft BP and CKD--cannot do ACE inhibitors --10/22--UOP 2030. Cont IV lasix --10/23--improving slowly.  Per card cont IV Lasix   Elevated troponin and hx of CAD: trop 533 --> 580.  No chest pain, likely due to demand ischemia,with chf -IV heparin started in ED--cont for 48 hours per cards rec -Aspirin, Lipitor -As needed nitroglycerin   Hyperlipidemia -Lipitor   Hypertension -IV hydralazine as needed -Metoprolol   History of CVA (cerebrovascular accident) -Aspirin, Lipitor   Peripheral vascular disease in diabetes mellitus (HCC) -Cilostazol   BPH (benign prostatic hyperplasia) -Flomax   Type II diabetes mellitus with renal manifestations ([place]), well controlled -Sliding scale insulin -Decrease 70/30 insulin from 21 to 12 units bid   COPD (chronic obstructive pulmonary disease) ([place]): No wheezing or rhonchi. -Bronchodilators   CKD (chronic kidney disease), stage IIIa: Stable -Follow-up with BMP   Abnormal LFTs: Likely due to congestion secondary to CHF exacerbation -Avoid using Tylenol   assess for home oxygen PT/OT--No PT needs    Procedures:none Family communication left message for dter Candace Consults :[facility] cardiology CODE STATUS:DNR per d/w Dr Blaine Hamper  DVT Prophylaxis :heparin gtt Level of care: Progressive Cardiac Status is: Inpatient  Remains inpatient appropriate because: treatment for CHF        TOTAL TIME TAKING CARE OF THIS PATIENT: 25 minutes.  >50% time spent on counselling and coordination of care  Note: This dictation was prepared with Dragon dictation along with smaller phrase technology. Any transcriptional errors that result from this process are unintentional.  Fritzi Mandes M.D    [facility]   CC: Primary care physician; Baxter Hire, MD Patient ID: Henry Lindsey, male   DOB: 01/03/1933, 87 y.o.   MRN: [id number]

## 2021-08-13 DIAGNOSIS — I5023 Acute on chronic systolic (congestive) heart failure: Secondary | ICD-10-CM | POA: Diagnosis not present

## 2021-08-13 LAB — GLUCOSE, CAPILLARY
Glucose-Capillary: 107 mg/dL — ABNORMAL HIGH (ref 70–99)
Glucose-Capillary: 140 mg/dL — ABNORMAL HIGH (ref 70–99)
Glucose-Capillary: 137 mg/dL — ABNORMAL HIGH (ref 70–99)
Glucose-Capillary: 150 mg/dL — ABNORMAL HIGH (ref 70–99)
Glucose-Capillary: 146 mg/dL — ABNORMAL HIGH (ref 70–99)
Glucose-Capillary: 162 mg/dL — ABNORMAL HIGH (ref 70–99)

## 2021-08-13 MED ORDER — SODIUM CHLORIDE 0.9% FLUSH
3.0000 mL | Freq: Two times a day (BID) | INTRAVENOUS | Status: DC
Start: 2021-08-13 — End: 2021-08-15
  Administered 2021-08-13 – 2021-08-14 (×3): 3 mL via INTRAVENOUS

## 2021-08-13 NOTE — Progress Notes (Signed)
Progress Note  Patient Name: Henry Lindsey Date of Encounter: 08/13/2021  [facility] Cardiologist: Ida Rogue, MD   Subjective   Minimal UOP. [place] says he is feeling better. No chest pain.   Inpatient Medications    Scheduled Meds:  ascorbic acid  500 mg Oral Daily   aspirin EC  81 mg Oral Daily   atorvastatin  40 mg Oral Daily   cholecalciferol  1,000 Units Oral Daily   cholestyramine  4 g Oral TID WC   furosemide  80 mg Intravenous BID   gabapentin  400 mg Oral BID   heparin  5,000 Units Subcutaneous Q8H   insulin aspart  0-5 Units Subcutaneous QHS   insulin aspart  0-9 Units Subcutaneous TID WC   insulin aspart protamine- aspart  15 Units Subcutaneous BID WC   latanoprost  2 drop Both Eyes QHS   mouth rinse  15 mL Mouth Rinse BID   metoprolol succinate  100 mg Oral Daily   multivitamin with minerals  1 tablet Oral Daily   pantoprazole  40 mg Oral Daily   silver sulfADIAZINE   Topical Daily   tamsulosin  0.4 mg Oral QPC breakfast   timolol  2 drop Both Eyes Daily   vitamin B-12  500 mcg Oral Daily   vitamin E  400 Units Oral Daily   Continuous Infusions:  PRN Meds: albuterol, dextromethorphan-guaiFENesin, hydrALAZINE, HYDROcodone-acetaminophen, nitroGLYCERIN, ondansetron (ZOFRAN) IV, traZODone   Vital Signs    Vitals:   08/13/21 0003 08/13/21 0220 08/13/21 0253 08/13/21 0727  BP: 120/78 106/74  112/74  Pulse: 77 75  72  Resp: 18 18  17   Temp: (!) 97.4 F (36.3 C) 97.7 F (36.5 C)  98.1 F (36.7 C)  TempSrc:      SpO2: 98% 97%  100%  Weight:   92.8 kg   Height:        Intake/Output Summary (Last 24 hours) at 08/13/2021 0804 Last data filed at 08/13/2021 0726 Gross per 24 hour  Intake 600 ml  Output 650 ml  Net -50 ml   Last 3 Weights 08/13/2021 08/12/2021 08/11/2021  Weight (lbs) 204 lb 9.6 oz 201 lb 11.5 oz 198 lb 3.2 oz  Weight (kg) 92.806 kg 91.5 kg 89.903 kg      Telemetry    NSR, HR 70s, PVCs - Personally Reviewed  ECG     No new - Personally Reviewed  Physical Exam   GEN: No acute distress.   Neck: + JVD Cardiac: RRR, no murmurs, rubs, or gallops.  Respiratory: crackles at bases GI: Soft, nontender, non-distended  MS: No edema; No deformity. Neuro:  Nonfocal  Psych: Normal affect   Labs    High Sensitivity Troponin:   Recent Labs  Lab 08/09/21 1231 08/09/21 1409 08/09/21 1658 08/09/21 2151 08/10/21 0643  TROPONINIHS 533* 580* 816* 1,183* 1,076*     Chemistry Recent Labs  Lab 08/09/21 1231 08/10/21 0643 08/11/21 1111 08/12/21 0617  NA 137 137 132* 136  K 4.6 4.1 3.7 3.7  CL 99 98 97* 97*  CO2 26 26 25 28   GLUCOSE 79 120* 156* 131*  BUN 26* 28* 27* 24*  CREATININE 1.31* 1.40* 1.32* 1.26*  CALCIUM 8.7* 8.6* 8.3* 8.3*  MG 1.8 1.9  --   --   PROT 7.3  --   --  7.2  ALBUMIN 2.8*  --   --  2.5*  AST 118*  --   --  70*  ALT 59*  --   --  52*  ALKPHOS 188*  --   --  145*  BILITOT 3.3*  --   --  1.7*  GFRNONAA 53* 49* 53* 56*  ANIONGAP 12 13 10 11     Lipids  Recent Labs  Lab 08/10/21 0643  CHOL 53  TRIG 54  HDL 13*  LDLCALC 29  CHOLHDL 4.1    Hematology Recent Labs  Lab 08/10/21 0643 08/11/21 0301 08/11/21 1111  WBC 6.6 7.5 7.3  RBC 4.64 4.72 4.97  HGB 11.9* 11.6* 12.1*  HCT 37.6* 38.1* 40.0  MCV 81.0 80.7 80.5  MCH 25.6* 24.6* 24.3*  MCHC 31.6 30.4 30.3  RDW 19.1* 18.9* 19.2*  PLT 236 247 270   Thyroid No results for input(s): TSH, FREET4 in the last 168 hours.  BNP Recent Labs  Lab 08/09/21 1231  BNP >4,500.0*    DDimer No results for input(s): DDIMER in the last 168 hours.   Radiology    No results found.  Cardiac Studies   Echocardiogram   1. Left ventricular ejection fraction, by estimation, is 25 to 30%. The  left ventricle has severely decreased function. The left ventricle  demonstrates global hypokinesis with severe hypokinesis of the anterior,  septal, and apical region. Inferior wall  best preserved. Left ventricular diastolic  parameters are consistent with  Grade II diastolic dysfunction (pseudonormalization). The average left  ventricular global longitudinal strain is -6.3 %. The global longitudinal  strain is abnormal.   2. Right ventricular systolic function is moderately reduced. The right  ventricular size is moderately enlarged. There is mildly elevated  pulmonary artery systolic pressure. The estimated right ventricular  systolic pressure is 09.3 mmHg.   3. Left atrial size was moderately dilated.   4. The mitral valve is normal in structure. Moderate to severe mitral  valve regurgitation.   5. Tricuspid valve regurgitation is mild to moderate.   6. Aortic dilatation noted. There is mild dilatation of the aortic root,  measuring 39 mm.   7. The inferior vena cava is dilated in size with <50% respiratory  variability, suggesting right atrial pressure of 15 mmHg.   Cardiac cath 11/2020 Ost LM to Mid LM lesion is 60% stenosed. Ost LAD lesion is 70% stenosed. Prox RCA to Dist RCA lesion is 100% stenosed. Prox LAD lesion is 50% stenosed. Dist LAD lesion is 35% stenosed. 1st Diag lesion is 80% stenosed. 2nd Mrg lesion is 70% stenosed. There is moderate left ventricular systolic dysfunction. LV end diastolic pressure is moderately elevated. The left ventricular ejection fraction is 35-45% by visual estimate. Hemodynamic findings consistent with mild pulmonary hypertension.  Patient Profile     87 y.o. male  with a history of CAD, ICM, HFrEF, HTN, HL, DMII, GERD, CVA, PAD, and carotid arterial dzs, who presented October 19 due to progressive dyspnea, increasing abdominal girth, lower extremity edema.  Elevated troponin to 1183.  EF 25 to 30% with global hypokinesis and severe anterior, septal, and apical hypokinesis on echo this admission.  Assessment & Plan    Acute on chronic HFrEF/ICM - EF 35% by echo earlier this year known moderate multivessel CAD and occluded RCA by cath Feb 2022 - Echo this  admission showed decreased EF 20-25% with global HK and more severe hypokinesis of the anterior, septal, and apical regions - Case discussed with interventional cardiology, poor candidate for high risk stenting. plan for medical management - continue Toprol. Hypotension limiting GDMT - IV lasix 80mg  BID - continue with diuresis - kidney function stable  Demand ischemia/CAD - HS trop 1183 - s/p February 2022 showing and occluded RCA with mod left man/LAD/D1/OM2 disease - CT surgery previously recommended medical therapy - intervention cardiology has felt he not an ideal candidate for PCI given diffuse nature of left main/prox LAD diease and concern regarding aortic atherosclerosis - Denies chest pain - continue ASA, statin, BB  HLD - continue atorvastatin  DM2 - A1C 6.7 per IM  PAD - continue ASA and statin therapy  CKD 3 - cardiorenal syndrome - monitor with diuresis  For questions or updates, please contact [place] Please consult [email or web address] for contact info under        Signed, Ticia Virgo Ninfa Meeker, PA-C  08/13/2021, 8:05 AM

## 2021-08-13 NOTE — Progress Notes (Signed)
[place] [facility] [place] NAME: Henry Lindsey    MR#:  [id number]  DATE OF BIRTH:  1932-11-05  SUBJECTIVE:   came in with increasing shortness of breath will prove congestive heart failure bilaterally reported denies chest pain. No family at bedside. Now off IV heparin drip.  Breathing better Good uop so far Dter at bedside REVIEW OF SYSTEMS:   Review of Systems  Constitutional:  Negative for chills, fever and weight loss.  HENT:  Negative for ear discharge, ear pain and nosebleeds.   Eyes:  Negative for blurred vision, pain and discharge.  Respiratory:  Positive for shortness of breath. Negative for sputum production, wheezing and stridor.   Cardiovascular:  Negative for chest pain, palpitations, orthopnea and PND.  Gastrointestinal:  Negative for abdominal pain, diarrhea, nausea and vomiting.  Genitourinary:  Negative for frequency and urgency.  Musculoskeletal:  Negative for back pain and joint pain.  Neurological:  Positive for weakness. Negative for sensory change, speech change and focal weakness.  Psychiatric/Behavioral:  Negative for depression and hallucinations. The patient is not nervous/anxious.   Tolerating Diet:yes Tolerating PT: No PT needs  DRUG ALLERGIES:   Allergies  Allergen Reactions  . Jardiance [Empagliflozin]     Rash all over and lips turn purple.   . Metformin And Related Diarrhea  . Trazodone Other (See Comments)    Extreme hallucinations/ psychosis     VITALS:  Blood pressure 96/74, pulse 92, temperature 97.6 F (36.4 C), resp. rate 18, height 5\' 11"  (1.803 m), weight 92.8 kg, SpO2 100 %.  PHYSICAL EXAMINATION:   Physical Exam  GENERAL:  87 y.o.-year-old patient lying in the bed with no acute distress.  LUNGS: Normal breath sounds bilaterally, no wheezing, rales, rhonchi. No use of accessory muscles of respiration.  CARDIOVASCULAR: S1, S2 normal. No murmurs, rubs, or gallops.  ABDOMEN: Soft, nontender,  nondistended. Bowel sounds present. No organomegaly or mass.  EXTREMITIES: + edema b/l.    NEUROLOGIC: non-focal PSYCHIATRIC:  patient is alert SKIN: No obvious rash, lesion, or ulcer.   LABORATORY PANEL:  CBC Recent Labs  Lab 08/11/21 1111  WBC 7.3  HGB 12.1*  HCT 40.0  PLT 270     Chemistries  Recent Labs  Lab 08/10/21 0643 08/11/21 1111 08/12/21 0617  NA 137   < > 136  K 4.1   < > 3.7  CL 98   < > 97*  CO2 26   < > 28  GLUCOSE 120*   < > 131*  BUN 28*   < > 24*  CREATININE 1.40*   < > 1.26*  CALCIUM 8.6*   < > 8.3*  MG 1.9  --   --   AST  --   --  70*  ALT  --   --  52*  ALKPHOS  --   --  145*  BILITOT  --   --  1.7*   < > = values in this interval not displayed.    Cardiac Enzymes No results for input(s): TROPONINI in the last 168 hours. RADIOLOGY:  No results found. ASSESSMENT AND PLAN:   Henry Lindsey is a 87 y.o. male with medical history significant of sCHF with EF 35%, hypertension, hyperlipidemia, diabetes mellitus, COPD, stroke, GERD, PVD, (s/p for right PTA SFA), CAD, BPH, carotid artery stenosis, CKD 3, diverticulitis, who presents with shortness of breath.  Patient states that he has shortness of breath for almost a week, which has  been progressively worsening. Patient was found to have oxygen desaturation to 88% on room air, which improved to 99% on 4 L oxygen.  Patient is normally not wearing oxygen at home.  Acute respiratory failure with hypoxia due to acute on chronic systolic CHF:  --2D echo 10/23/1592 showed EF of 35%.   --came in with leg edema, positive JVD, elevated BNP > 4500, crackles on auscultation, clinically consistent with CHF exacerbation.   --Dr. Rockey Situ of cardiology is consulted due to elevated troponin -Lasix 80 mg bid per card --UOP document needs to be accurate -strict I/O's -Low salt diet -Fluid restriction --soft BP and CKD--cannot do ACE inhibitors --10/22--UOP 2030. Cont IV lasix --10/23--improving slowly. Per card  cont IV Lasix --10/24 cont lasix. Assess for home oxygen   Elevated troponin and hx of CAD: trop 533 --> 580.  No chest pain, likely due to demand ischemia,with chf -IV heparin started in ED--cont for 48 hours per cards rec -Aspirin, Lipitor -As needed nitroglycerin   Hyperlipidemia -Lipitor   Hypertension -IV hydralazine as needed -Metoprolol   History of CVA (cerebrovascular accident) -Aspirin, Lipitor   Peripheral vascular disease in diabetes mellitus (HCC) -Cilostazol   BPH (benign prostatic hyperplasia) -Flomax   Type II diabetes mellitus with renal manifestations ([place]), well controlled -Sliding scale insulin - 70/30 insulin  15 units bid   COPD (chronic obstructive pulmonary disease) ([place]): No wheezing or rhonchi. -Bronchodilators   CKD (chronic kidney disease), stage IIIa: Stable -creat 1.2   Abnormal LFTs: Likely due to congestion secondary to CHF exacerbation -Avoid using Tylenol   assess for home oxygen PT/OT--No PT needs    Procedures:none Family communication spoke with dter Henry Lindsey at bedside Consults :[facility] cardiology CODE STATUS:DNR per d/w Dr Blaine Hamper  DVT Prophylaxis :heparin gtt Level of care: Progressive Cardiac Status is: Inpatient  Remains inpatient appropriate because: treatment for CHF        TOTAL TIME TAKING CARE OF THIS PATIENT: 25 minutes.  >50% time spent on counselling and coordination of care  Note: This dictation was prepared with Dragon dictation along with smaller phrase technology. Any transcriptional errors that result from this process are unintentional.  Henry Lindsey M.D    [facility]   CC: Primary care physician; Henry Hire, MD Patient ID: Henry Lindsey, male   DOB: [birth year], 87 y.o.   MRN: [id number]

## 2021-08-13 NOTE — H&P (View-Only) (Signed)
Progress Note  Patient Name: Henry Lindsey Date of Encounter: 08/13/2021  [facility] Cardiologist: Ida Rogue, MD   Subjective   Minimal UOP. [place] says he is feeling better. No chest pain.   Inpatient Medications    Scheduled Meds:  ascorbic acid  500 mg Oral Daily   aspirin EC  81 mg Oral Daily   atorvastatin  40 mg Oral Daily   cholecalciferol  1,000 Units Oral Daily   cholestyramine  4 g Oral TID WC   furosemide  80 mg Intravenous BID   gabapentin  400 mg Oral BID   heparin  5,000 Units Subcutaneous Q8H   insulin aspart  0-5 Units Subcutaneous QHS   insulin aspart  0-9 Units Subcutaneous TID WC   insulin aspart protamine- aspart  15 Units Subcutaneous BID WC   latanoprost  2 drop Both Eyes QHS   mouth rinse  15 mL Mouth Rinse BID   metoprolol succinate  100 mg Oral Daily   multivitamin with minerals  1 tablet Oral Daily   pantoprazole  40 mg Oral Daily   silver sulfADIAZINE   Topical Daily   tamsulosin  0.4 mg Oral QPC breakfast   timolol  2 drop Both Eyes Daily   vitamin B-12  500 mcg Oral Daily   vitamin E  400 Units Oral Daily   Continuous Infusions:  PRN Meds: albuterol, dextromethorphan-guaiFENesin, hydrALAZINE, HYDROcodone-acetaminophen, nitroGLYCERIN, ondansetron (ZOFRAN) IV, traZODone   Vital Signs    Vitals:   08/13/21 0003 08/13/21 0220 08/13/21 0253 08/13/21 0727  BP: 120/78 106/74  112/74  Pulse: 77 75  72  Resp: 18 18  17   Temp: (!) 97.4 F (36.3 C) 97.7 F (36.5 C)  98.1 F (36.7 C)  TempSrc:      SpO2: 98% 97%  100%  Weight:   92.8 kg   Height:        Intake/Output Summary (Last 24 hours) at 08/13/2021 0804 Last data filed at 08/13/2021 0726 Gross per 24 hour  Intake 600 ml  Output 650 ml  Net -50 ml   Last 3 Weights 08/13/2021 08/12/2021 08/11/2021  Weight (lbs) 204 lb 9.6 oz 201 lb 11.5 oz 198 lb 3.2 oz  Weight (kg) 92.806 kg 91.5 kg 89.903 kg      Telemetry    NSR, HR 70s, PVCs - Personally Reviewed  ECG     No new - Personally Reviewed  Physical Exam   GEN: No acute distress.   Neck: + JVD Cardiac: RRR, no murmurs, rubs, or gallops.  Respiratory: crackles at bases GI: Soft, nontender, non-distended  MS: No edema; No deformity. Neuro:  Nonfocal  Psych: Normal affect   Labs    High Sensitivity Troponin:   Recent Labs  Lab 08/09/21 1231 08/09/21 1409 08/09/21 1658 08/09/21 2151 08/10/21 0643  TROPONINIHS 533* 580* 816* 1,183* 1,076*     Chemistry Recent Labs  Lab 08/09/21 1231 08/10/21 0643 08/11/21 1111 08/12/21 0617  NA 137 137 132* 136  K 4.6 4.1 3.7 3.7  CL 99 98 97* 97*  CO2 26 26 25 28   GLUCOSE 79 120* 156* 131*  BUN 26* 28* 27* 24*  CREATININE 1.31* 1.40* 1.32* 1.26*  CALCIUM 8.7* 8.6* 8.3* 8.3*  MG 1.8 1.9  --   --   PROT 7.3  --   --  7.2  ALBUMIN 2.8*  --   --  2.5*  AST 118*  --   --  70*  ALT 59*  --   --  52*  ALKPHOS 188*  --   --  145*  BILITOT 3.3*  --   --  1.7*  GFRNONAA 53* 49* 53* 56*  ANIONGAP 12 13 10 11     Lipids  Recent Labs  Lab 08/10/21 0643  CHOL 53  TRIG 54  HDL 13*  LDLCALC 29  CHOLHDL 4.1    Hematology Recent Labs  Lab 08/10/21 0643 08/11/21 0301 08/11/21 1111  WBC 6.6 7.5 7.3  RBC 4.64 4.72 4.97  HGB 11.9* 11.6* 12.1*  HCT 37.6* 38.1* 40.0  MCV 81.0 80.7 80.5  MCH 25.6* 24.6* 24.3*  MCHC 31.6 30.4 30.3  RDW 19.1* 18.9* 19.2*  PLT 236 247 270   Thyroid No results for input(s): TSH, FREET4 in the last 168 hours.  BNP Recent Labs  Lab 08/09/21 1231  BNP >4,500.0*    DDimer No results for input(s): DDIMER in the last 168 hours.   Radiology    No results found.  Cardiac Studies   Echocardiogram   1. Left ventricular ejection fraction, by estimation, is 25 to 30%. The  left ventricle has severely decreased function. The left ventricle  demonstrates global hypokinesis with severe hypokinesis of the anterior,  septal, and apical region. Inferior wall  best preserved. Left ventricular diastolic  parameters are consistent with  Grade II diastolic dysfunction (pseudonormalization). The average left  ventricular global longitudinal strain is -6.3 %. The global longitudinal  strain is abnormal.   2. Right ventricular systolic function is moderately reduced. The right  ventricular size is moderately enlarged. There is mildly elevated  pulmonary artery systolic pressure. The estimated right ventricular  systolic pressure is 83.3 mmHg.   3. Left atrial size was moderately dilated.   4. The mitral valve is normal in structure. Moderate to severe mitral  valve regurgitation.   5. Tricuspid valve regurgitation is mild to moderate.   6. Aortic dilatation noted. There is mild dilatation of the aortic root,  measuring 39 mm.   7. The inferior vena cava is dilated in size with <50% respiratory  variability, suggesting right atrial pressure of 15 mmHg.   Cardiac cath 11/2020 Ost LM to Mid LM lesion is 60% stenosed. Ost LAD lesion is 70% stenosed. Prox RCA to Dist RCA lesion is 100% stenosed. Prox LAD lesion is 50% stenosed. Dist LAD lesion is 35% stenosed. 1st Diag lesion is 80% stenosed. 2nd Mrg lesion is 70% stenosed. There is moderate left ventricular systolic dysfunction. LV end diastolic pressure is moderately elevated. The left ventricular ejection fraction is 35-45% by visual estimate. Hemodynamic findings consistent with mild pulmonary hypertension.  Patient Profile     87 y.o. male  with a history of CAD, ICM, HFrEF, HTN, HL, DMII, GERD, CVA, PAD, and carotid arterial dzs, who presented October 19 due to progressive dyspnea, increasing abdominal girth, lower extremity edema.  Elevated troponin to 1183.  EF 25 to 30% with global hypokinesis and severe anterior, septal, and apical hypokinesis on echo this admission.  Assessment & Plan    Acute on chronic HFrEF/ICM - EF 35% by echo earlier this year known moderate multivessel CAD and occluded RCA by cath Feb 2022 - Echo this  admission showed decreased EF 20-25% with global HK and more severe hypokinesis of the anterior, septal, and apical regions - Case discussed with interventional cardiology, poor candidate for high risk stenting. plan for medical management - continue Toprol. Hypotension limiting GDMT - IV lasix 80mg  BID - continue with diuresis - kidney function stable  Demand ischemia/CAD - HS trop 1183 - s/p February 2022 showing and occluded RCA with mod left man/LAD/D1/OM2 disease - CT surgery previously recommended medical therapy - intervention cardiology has felt he not an ideal candidate for PCI given diffuse nature of left main/prox LAD diease and concern regarding aortic atherosclerosis - Denies chest pain - continue ASA, statin, BB  HLD - continue atorvastatin  DM2 - A1C 6.7 per IM  PAD - continue ASA and statin therapy  CKD 3 - cardiorenal syndrome - monitor with diuresis  For questions or updates, please contact [place] Please consult [email or web address] for contact info under        Signed, Desirey Keahey Ninfa Meeker, PA-C  08/13/2021, 8:05 AM

## 2021-08-13 NOTE — Care Management Important Message (Signed)
Important Message  Patient Details  Name: Henry Lindsey MRN: [id number] Date of Birth: 08/19/1933   Medicare Important Message Given:  Yes     Dannette Barbara 08/13/2021, 4:07 PM

## 2021-08-13 NOTE — Progress Notes (Signed)
PT Cancellation Note  Patient Details Name: Henry Lindsey MRN: [id number] DOB: 04-03-1933   Cancelled Treatment:    Reason Eval/Treat Not Completed: Other (comment).  Received message from MD to see pt.  Upon PT arrival to pt's room, pt's daughter reporting pt recently requiring nausea medication and is now drowsy and is not a good time to work with pt (MD and nurse notified).  Will re-attempt PT session at a later date/time.  Leitha Bleak, PT 08/13/21, 10:12 AM

## 2021-08-13 NOTE — Progress Notes (Signed)
Physical Therapy Treatment Patient Details Name: Henry Lindsey MRN: [id number] DOB: June 19, 1933 Today's Date: 08/13/2021   History of Present Illness 87 yo male with onset of SOB and hypoxia for a week was brought to hosp to admit 10/20.  Had productive cough, now clear.  Requires 4L O2 at admit, currently on 3L O2 with no prev use of it.  Cardiology dx acute CHF, ischemic cardiomyopathy with demand ischemia and elevated troponin, noted elevated LFT's from heart failure.   PMHx: CHF, EF 35%, HTN, HLD, DM, COPD, stroke, GERD, PVD, R PTA SFA, CAD, carotid artery stenosis, CKD 3, diverticulitis,    PT Comments    Patient tolerated session well and was agreeable to treatment. Vitals were taken throughout session and remained normal. Patient was able to ambulate a total of 40 feet with a seated recovery break after 20 feet to assess vitals at [place]. No pain was reported throughout the session. Patient indicated no signs of SOB or dizziness. Skilled physical therapy would be beneficial to improve patients overall cardiovascular endurance, balance, and LE weakness.   Recommendations for follow up therapy are one component of a multi-disciplinary discharge planning process, led by the attending physician.  Recommendations may be updated based on patient status, additional functional criteria and insurance authorization.  Follow Up Recommendations  Home health PT     Assistance Recommended at Discharge Frequent or constant Supervision/Assistance  Equipment Recommendations  Rolling walker (2 wheels)    Recommendations for Other Services       Precautions / Restrictions Precautions Precautions: Fall     Mobility  Bed Mobility Overal bed mobility: Modified Independent Bed Mobility: Supine to Sit     Supine to sit: Modified independent (Device/Increase time)          Transfers Overall transfer level: Needs assistance (CGA) Equipment used: Rolling walker (2 wheels) Transfers: Sit to/from  Stand Sit to Stand: Min guard                Ambulation/Gait Ambulation/Gait assistance: Min guard Gait Distance (Feet):  (2x49feet) Assistive device: Rolling walker (2 wheels) Gait Pattern/deviations: Step-through pattern;Shuffle;Narrow base of support Gait velocity: Decreased cadence   General Gait Details: forward trunk lean   Stairs             Wheelchair Mobility    Modified Rankin (Stroke Patients Only)       Balance Overall balance assessment: Needs assistance Sitting-balance support: Bilateral upper extremity supported Sitting balance-Leahy Scale: Good     Standing balance support: Bilateral upper extremity supported Standing balance-Leahy Scale: Fair                              Cognition Arousal/Alertness: Awake/alert Behavior During Therapy: WFL for tasks assessed/performed Overall Cognitive Status: Within Functional Limits for tasks assessed                                          Exercises      General Comments        Pertinent Vitals/Pain Pain Assessment: 0-10 Pain Score: 0-No pain Pain Intervention(s): Monitored during session;Repositioned;Limited activity within patient's tolerance    Home Living                          Prior Function  PT Goals (current goals can now be found in the care plan section) Acute Rehab PT Goals Patient Stated Goal: to go home PT Goal Formulation: With patient/family Time For Goal Achievement: 08/27/21 Potential to Achieve Goals: Good Progress towards PT goals: Progressing toward goals    Frequency    Min 2X/week      PT Plan Current plan remains appropriate    Co-evaluation              AM-PAC PT "6 Clicks" Mobility   Outcome Measure  Help needed turning from your back to your side while in a flat bed without using bedrails?: A Little Help needed moving from lying on your back to sitting on the side of a flat bed without  using bedrails?: A Little Help needed moving to and from a bed to a chair (including a wheelchair)?: A Little Help needed standing up from a chair using your arms (e.g., wheelchair or bedside chair)?: A Little Help needed to walk in hospital room?: A Little Help needed climbing 3-5 steps with a railing? : A Little 6 Click Score: 18    End of Session Equipment Utilized During Treatment: Gait belt Activity Tolerance: Patient tolerated treatment well;No increased pain Patient left: in chair;with call bell/phone within reach;with chair alarm set;with family/visitor present Nurse Communication:  (Patient's daughter requested sponge bath) PT Visit Diagnosis: Muscle weakness (generalized) (M62.81);Unsteadiness on feet (R26.81);Other abnormalities of gait and mobility (R26.89)     Time: 2025-4270 PT Time Calculation (min) (ACUTE ONLY): 38 min  Charges:  $Therapeutic Activity: 38-52 mins                      Vena Rua, PT 08/13/2021, 5:17 PM

## 2021-08-14 ENCOUNTER — Encounter
Admission: EM | Disposition: A | Discharge status: Home Health | Payer: Self-pay | Source: Home / Self Care | Attending: Internal Medicine

## 2021-08-14 DIAGNOSIS — I214 Non-ST elevation (NSTEMI) myocardial infarction: Secondary | ICD-10-CM | POA: Diagnosis not present

## 2021-08-14 DIAGNOSIS — I248 Other forms of acute ischemic heart disease: Secondary | ICD-10-CM | POA: Diagnosis not present

## 2021-08-14 DIAGNOSIS — J9601 Acute respiratory failure with hypoxia: Secondary | ICD-10-CM | POA: Diagnosis not present

## 2021-08-14 DIAGNOSIS — I429 Cardiomyopathy, unspecified: Secondary | ICD-10-CM

## 2021-08-14 DIAGNOSIS — I5023 Acute on chronic systolic (congestive) heart failure: Secondary | ICD-10-CM | POA: Diagnosis not present

## 2021-08-14 DIAGNOSIS — I25118 Atherosclerotic heart disease of native coronary artery with other forms of angina pectoris: Secondary | ICD-10-CM

## 2021-08-14 DIAGNOSIS — I2510 Atherosclerotic heart disease of native coronary artery without angina pectoris: Secondary | ICD-10-CM

## 2021-08-14 HISTORY — PX: RIGHT/LEFT HEART CATH AND CORONARY ANGIOGRAPHY: CATH118266

## 2021-08-14 HISTORY — PX: ABDOMINAL AORTOGRAM: CATH118222

## 2021-08-14 LAB — BASIC METABOLIC PANEL
Anion gap: 11 (ref 5–15)
BUN: 33 mg/dL — ABNORMAL HIGH (ref 8–23)
CO2: 27 mmol/L (ref 22–32)
Calcium: 8.4 mg/dL — ABNORMAL LOW (ref 8.9–10.3)
Chloride: 96 mmol/L — ABNORMAL LOW (ref 98–111)
Creatinine, Ser: 1.38 mg/dL — ABNORMAL HIGH (ref 0.61–1.24)
GFR, Estimated: 50 mL/min — ABNORMAL LOW (ref >60)
Glucose, Bld: 100 mg/dL — ABNORMAL HIGH (ref 70–99)
Potassium: 3.7 mmol/L (ref 3.5–5.1)
Sodium: 134 mmol/L — ABNORMAL LOW (ref 135–145)

## 2021-08-14 LAB — CBC
HCT: 40 % (ref 39.0–52.0)
Hemoglobin: 12.2 g/dL — ABNORMAL LOW (ref 13.0–17.0)
MCH: 25.1 pg — ABNORMAL LOW (ref 26.0–34.0)
MCHC: 30.5 g/dL (ref 30.0–36.0)
MCV: 82.1 fL (ref 80.0–100.0)
Platelets: 250 10*3/uL (ref 150–400)
RBC: 4.87 MIL/uL (ref 4.22–5.81)
RDW: 18.9 % — ABNORMAL HIGH (ref 11.5–15.5)
WBC: 6.2 10*3/uL (ref 4.0–10.5)
nRBC: 0 % (ref 0.0–0.2)

## 2021-08-14 LAB — GLUCOSE, CAPILLARY
Glucose-Capillary: 114 mg/dL — ABNORMAL HIGH (ref 70–99)
Glucose-Capillary: 100 mg/dL — ABNORMAL HIGH (ref 70–99)
Glucose-Capillary: 93 mg/dL (ref 70–99)
Glucose-Capillary: 174 mg/dL — ABNORMAL HIGH (ref 70–99)
Glucose-Capillary: 217 mg/dL — ABNORMAL HIGH (ref 70–99)

## 2021-08-14 SURGERY — RIGHT/LEFT HEART CATH AND CORONARY ANGIOGRAPHY
Anesthesia: Moderate Sedation

## 2021-08-14 MED ORDER — MIDAZOLAM HCL 2 MG/2ML IJ SOLN
INTRAMUSCULAR | Status: DC | PRN
  Administered 2021-08-14 (×2): .5 mg via INTRAVENOUS

## 2021-08-14 MED ORDER — LIDOCAINE HCL (PF) 1 % IJ SOLN
INTRAMUSCULAR | Status: DC | PRN
  Administered 2021-08-14: 2 mL

## 2021-08-14 MED ORDER — SODIUM CHLORIDE 0.9% FLUSH
3.0000 mL | INTRAVENOUS | Status: DC | PRN
Start: 2021-08-14 — End: 2021-08-14

## 2021-08-14 MED ORDER — ASPIRIN 81 MG PO CHEW: 81.0000 mg | CHEWABLE_TABLET | ORAL | Status: DC

## 2021-08-14 MED ORDER — VERAPAMIL HCL 2.5 MG/ML IV SOLN
INTRAVENOUS | Status: AC
  Filled 2021-08-14: qty 2

## 2021-08-14 MED ORDER — HEPARIN SODIUM (PORCINE) 1000 UNIT/ML IJ SOLN
INTRAMUSCULAR | Status: DC | PRN
  Administered 2021-08-14: 4500 [IU] via INTRAVENOUS

## 2021-08-14 MED ORDER — FENTANYL CITRATE (PF) 100 MCG/2ML IJ SOLN
INTRAMUSCULAR | Status: DC | PRN
  Administered 2021-08-14 (×2): 12.5 ug via INTRAVENOUS

## 2021-08-14 MED ORDER — HEPARIN (PORCINE) IN NACL 1000-0.9 UT/500ML-% IV SOLN
INTRAVENOUS | Status: DC | PRN
  Administered 2021-08-14 (×2): 500 mL

## 2021-08-14 MED ORDER — MIDAZOLAM HCL 2 MG/2ML IJ SOLN
INTRAMUSCULAR | Status: AC
  Filled 2021-08-14: qty 2

## 2021-08-14 MED ORDER — HEPARIN SODIUM (PORCINE) 1000 UNIT/ML IJ SOLN
INTRAMUSCULAR | Status: AC
  Filled 2021-08-14: qty 1

## 2021-08-14 MED ORDER — LIDOCAINE HCL 1 % IJ SOLN
INTRAMUSCULAR | Status: AC
  Filled 2021-08-14: qty 20

## 2021-08-14 MED ORDER — INSULIN ASPART PROT & ASPART (70-30 MIX) 100 UNIT/ML ~~LOC~~ SUSP: 15.0000 [IU] | Freq: Two times a day (BID) | SUBCUTANEOUS | 11 refills | Status: DC

## 2021-08-14 MED ORDER — VERAPAMIL HCL 2.5 MG/ML IV SOLN
INTRAVENOUS | Status: DC | PRN
  Administered 2021-08-14: 2.5 mg via INTRA_ARTERIAL

## 2021-08-14 MED ORDER — SODIUM CHLORIDE 0.9% FLUSH: 3.0000 mL | Freq: Two times a day (BID) | INTRAVENOUS | Status: DC

## 2021-08-14 MED ORDER — SODIUM CHLORIDE 0.9 % IV SOLN: INTRAVENOUS | Status: DC

## 2021-08-14 MED ORDER — CLOPIDOGREL BISULFATE 75 MG PO TABS
600.0000 mg | ORAL_TABLET | Freq: Once | ORAL | Status: AC
  Administered 2021-08-14: 600 mg via ORAL
  Filled 2021-08-14: qty 8

## 2021-08-14 MED ORDER — HEPARIN SODIUM (PORCINE) 5000 UNIT/ML IJ SOLN
5000.0000 [IU] | Freq: Three times a day (TID) | INTRAMUSCULAR | Status: DC
  Administered 2021-08-14 – 2021-08-15 (×2): 5000 [IU] via SUBCUTANEOUS
  Filled 2021-08-14 (×2): qty 1

## 2021-08-14 MED ORDER — FENTANYL CITRATE (PF) 100 MCG/2ML IJ SOLN
INTRAMUSCULAR | Status: AC
  Filled 2021-08-14: qty 2

## 2021-08-14 MED ORDER — CLOPIDOGREL BISULFATE 75 MG PO TABS: 75.0000 mg | ORAL_TABLET | Freq: Every day | ORAL | 0 refills | Status: DC

## 2021-08-14 MED ORDER — SODIUM CHLORIDE 0.9% FLUSH: 3.0000 mL | INTRAVENOUS | Status: DC | PRN

## 2021-08-14 MED ORDER — SODIUM CHLORIDE 0.9 % IV SOLN: 250.0000 mL | INTRAVENOUS | Status: DC | PRN

## 2021-08-14 MED ORDER — CLOPIDOGREL BISULFATE 75 MG PO TABS
75.0000 mg | ORAL_TABLET | Freq: Every day | ORAL | Status: DC
  Administered 2021-08-15: 75 mg via ORAL
  Filled 2021-08-14: qty 1

## 2021-08-14 MED ORDER — HEPARIN (PORCINE) IN NACL 1000-0.9 UT/500ML-% IV SOLN
INTRAVENOUS | Status: AC
  Filled 2021-08-14: qty 1000

## 2021-08-14 MED ORDER — IOHEXOL 350 MG/ML SOLN
INTRAVENOUS | Status: DC | PRN
  Administered 2021-08-14: 80 mL via INTRACARDIAC

## 2021-08-14 SURGICAL SUPPLY — 14 items
CATH INFINITI 5 FR MPA2 (CATHETERS) ×1 IMPLANT
CATH INFINITI 5F PIG 125CM (CATHETERS) ×1 IMPLANT
CATH SWAN GANZ 7F STRAIGHT (CATHETERS) ×1 IMPLANT
DEVICE RAD TR BAND REGULAR (VASCULAR PRODUCTS) ×1 IMPLANT
DRAPE BRACHIAL (DRAPES) ×2 IMPLANT
GLIDESHEATH SLEND SS 6F .021 (SHEATH) ×1 IMPLANT
GLIDESHEATH SLENDER 7FR .021G (SHEATH) ×1 IMPLANT
GUIDEWIRE ANGLED .035 180CM (WIRE) ×1 IMPLANT
PACK CARDIAC CATH (CUSTOM PROCEDURE TRAY) ×2 IMPLANT
PROTECTION STATION PRESSURIZED (MISCELLANEOUS) ×2
SET ATX SIMPLICITY (MISCELLANEOUS) ×1 IMPLANT
STATION PROTECTION PRESSURIZED (MISCELLANEOUS) IMPLANT
WIRE GUIDERIGHT .035X150 (WIRE) ×2 IMPLANT
WIRE HI TORQ VERSACORE J 260CM (WIRE) ×1 IMPLANT

## 2021-08-14 NOTE — H&P (Addendum)
Cardiology Admission History and Physical:   Patient ID: Henry Lindsey MRN: [id number]; DOB: [birth year]   Admission date: (Not on file)  PCP:  Baxter Hire, MD   [facility] Providers Cardiologist:  Ida Rogue, MD   {  Chief Complaint:  shortness of breath, lower leg edema  Patient Profile:   Henry Lindsey is a 87 y.o. male with h/o CAD, ICM, HFrEF, HTN, HL, DM2, GERD, CVA, PAD, carotid dzs who is being seen 08/14/2021 for the evaluation of CHF and demand ischemia.  History of Present Illness:   Mr. Henry Lindsey with the above medical history. In the setting of respiratory failure from bronchitis and PNA in 09/2020. Echo at that time showed LV dysfunction w/ EF 35% nl RVSP, mod MR, mild TR, mild pericardial effusion. He was placed on lasix and referred to cardiology. Cath in 11/2020 showed severe diffuse LAD, diag, and OM dzs with an occluded RCA. CO/CL. EF 35%. He was evaluated by CT surgery and felt to be a poor surgical candidate and has been medically managed since. He was seen in the clinic 9/30 and was stable. He reported he was taking Pletal and was advised to d/c this. H/o of rash on SGLT2i.  The patient presented to [facility] ED 10/19 for increasing DOE, orthopnea, early satiety, and lower extremity edema. Reproted dry weight of 189lbs. He noted he was becoming volume overloaded and started taking an extra lasix with no improvement. Due to no improvement EMS was called 10/19. O2 sat was 80% on arrival improved to 90% on 4L. HE was tachycardic with EKG showing ST @ 112 w/ septal infarct and mild ST depression. Labs showed BNP>4500 and HS trop 533. BUN/Cr mildly elevated above baseline. CXR with small R and trace L pleural effusion. He was given IV lasix, started on IV heparin and admitted. GDMT limited due to hypotension and CKD. Echo showed LVEF 20-25%. Cath images were reviewed from February 2022 which showed chronically occluded RCA with L>R collaterals, severe calcified ostial LAD  stenosis extending into the distal left main. After a long discussion with the family, it was decided to do re-look cath. If anatomy unchanged, plan to transfer the patient to [facility] for high risk PCI with mechanical support.  R/L heart cath 10/25 was unchanged showing Severe multivessel CAD, including 80-90% ostial LAD stenosis and CTO pRCA as seen on prior, moderately elevated left heart, right heart, and pulmonary artery pressures, moderately-severely reduced cardiac output/index, ectatic abdominal aorta/external iliac arteries, and common femoral arteries without significant disease, 70% right internal iliac artery stenosis, 50% ostial stenosis or right renal artery   Past Medical History:  Diagnosis Date   Arthritis    BPH (benign prostatic hyperplasia)    CAD (coronary artery disease)    a. 11/2020 Cath: LM 50d, LAD sev ost dzs, mod to sev prox/mid dzs. D1 sev dzs. LCX mild prox dzs, OM2 mod-sev dzs, RCA 100p. RPL and RPDA fill via L-->R collats. EF 35%. Seen by CT surgery-->Med Rx advised.   Carotid artery stenosis    a. 11/2010 U/S: <50% bilaterally; b. 03/2021 40-59% bilat ICA stenoses.   COPD (chronic obstructive pulmonary disease) ([place])    noted CT 10/05/16 former smoker quit age 67    CVA (cerebral infarction) 11/2010   r thalamic lacunar   Diabetes mellitus    GERD (gastroesophageal reflux disease)    HFrEF (heart failure with reduced ejection fraction) ([place])    a. 10/2020 Echo: EF 35%.   Hyperlipidemia  Hypertension    Ischemic cardiomyopathy    a. 10/2020 Echo: EF 35%. Nl RVSP. Mod MR. Mild TR; b. 11/2020 Cath:  CO/CI 5.05/2.49. LV gram: EF 35%.   Lower extremity cellulitis    Neuromuscular disorder ([place])    Peripheral vascular disease in diabetes mellitus ([place])    a. 06/2012 95% occlusion s/p PTA R SFA (Dr. Lucky Cowboy); b. 03/2021 ABIs: R 1.02, L 0.96.   Stroke [facility])     Past Surgical History:  Procedure Laterality Date   CHOLECYSTECTOMY N/A 11/13/2016   Procedure: LAPAROSCOPIC  CHOLECYSTECTOMY WITH INTRAOPERATIVE CHOLANGIOGRAM;  Surgeon: Olean Ree, MD;  Location: [facility] ORS;  Service: General;  Laterality: N/A;   COLONOSCOPY WITH PROPOFOL N/A 02/10/2018   Procedure: COLONOSCOPY WITH PROPOFOL;  Surgeon: Lucilla Lame, MD;  Location: [facility] ENDOSCOPY;  Service: Endoscopy;  Laterality: N/A;   ERCP N/A 11/17/2016   Procedure: ENDOSCOPIC RETROGRADE CHOLANGIOPANCREATOGRAPHY (ERCP);  Surgeon: Irene Shipper, MD;  Location: [facility] ENDOSCOPY;  Service: Endoscopy;  Laterality: N/A;   ERCP N/A 02/04/2017   Procedure: ENDOSCOPIC RETROGRADE CHOLANGIOPANCREATOGRAPHY (ERCP) Stent removal;  Surgeon: Lucilla Lame, MD;  Location: [facility] ENDOSCOPY;  Service: Endoscopy;  Laterality: N/A;   IR GENERIC HISTORICAL  10/06/2016   IR PERC CHOLECYSTOSTOMY 10/06/2016 Aletta Edouard, MD [facility]-INTERV RAD   JOINT REPLACEMENT Left 2014   left knee   RIGHT/LEFT HEART CATH AND CORONARY ANGIOGRAPHY N/A 11/24/2020   Procedure: RIGHT/LEFT HEART CATH AND CORONARY ANGIOGRAPHY;  Surgeon: Minna Merritts, MD;  Location: [place] CV LAB;  Service: Cardiovascular;  Laterality: N/A;   TOTAL KNEE ARTHROPLASTY Right 08/08/2020   Procedure: TOTAL KNEE ARTHROPLASTY;  Surgeon: Lovell Sheehan, MD;  Location: [facility] ORS;  Service: Orthopedics;  Laterality: Right;   UPPER GASTROINTESTINAL ENDOSCOPY     WRIST SURGERY       Medications Prior to Admission: Prior to Admission medications   Medication Sig Start Date End Date Taking? Authorizing Provider  albuterol (VENTOLIN HFA) 108 (90 Base) MCG/ACT inhaler Inhale 2 puffs into the lungs every 6 (six) hours as needed. 10/29/20   [provider]  ascorbic acid (VITAMIN C) 500 MG tablet Take 500 mg by mouth daily.     [provider]  aspirin EC 81 MG tablet Take 1 tablet (81 mg total) by mouth daily. Swallow whole. 01/31/21   Minna Merritts, MD  atorvastatin (LIPITOR) 40 MG tablet Take 1 tablet (40 mg total) by mouth daily. 12/08/20   Minna Merritts, MD  Blood  Glucose Monitoring Suppl (ONE TOUCH ULTRA SYSTEM KIT) w/Device KIT 1 kit by Does not apply route once. Use DX code E11.59 10/18/15   [name], MD  cholecalciferol (VITAMIN D) 25 MCG (1000 UNIT) tablet Take 1,000 Units by mouth daily.    [provider]  cholestyramine (QUESTRAN) 4 g packet Take 4 g by mouth 3 (three) times daily with meals.    [provider]  cilostazol (PLETAL) 100 MG tablet Take 100 mg by mouth 2 (two) times daily. 06/01/21   [provider]  diclofenac Sodium (VOLTAREN) 1 % GEL Apply 2 g topically 2 (two) times daily as needed (pain).    [provider]  furosemide (LASIX) 40 MG tablet Take 1 tablet (40 mg total) by mouth daily. May take an additional 1/2 tab for wt gain of 2 lbs in 24 hrs. 07/20/21 07/20/22  Theora Gianotti, NP  gabapentin (NEURONTIN) 400 MG capsule Take 400 mg by mouth 2 (two) times daily.  [provider]  glipiZIDE (GLUCOTROL) 10 MG tablet Take 10 mg by mouth 2 (two) times daily before a meal.    [provider]  HYDROcodone-acetaminophen (NORCO/VICODIN) 5-325 MG tablet Take 1 tablet by mouth every 6 (six) hours as needed. 10/27/20   [provider]  INS SYRINGE/NEEDLE 1CC/28G (B-D INSULIN SYRINGE 1CC/28G) 28G X 1/2" 1 ML MISC USE TO ADMINISTER INSULIN DAILY 02/13/16   [name], MD  insulin lispro protamine-lispro (HUMALOG MIX 75/25) (75-25) 100 UNIT/ML SUSP injection Inject into the skin in the morning and at bedtime. 03/27/19   [provider]  insulin NPH-regular Human (70-30) 100 UNIT/ML injection Inject 23 Units into the skin in the morning and at bedtime.     [provider]  latanoprost (XALATAN) 0.005 % ophthalmic solution Place 2 drops into both eyes at bedtime.  11/01/15   [provider]  metoprolol succinate (TOPROL-XL) 100 MG 24 hr tablet Take 1 tablet (100 mg total) by mouth daily. Take with or immediately following a meal. 12/08/20   Gollan,  Kathlene November, MD  Multiple Vitamin (MULTIVITAMIN WITH MINERALS) TABS tablet Take 1 tablet by mouth daily.    [provider]  nitroGLYCERIN (NITROSTAT) 0.4 MG SL tablet Place 1 tablet (0.4 mg total) under the tongue every 5 (five) minutes as needed for chest pain. MAXIMUM 3 TABLETS 12/08/20   Minna Merritts, MD  ONE TOUCH ULTRA TEST test strip USE 1 STRIP TO TEST THREE TIMES DAILY AS DIRECTED 08/31/18   [name], MD  [facility] DELICA LANCETS 78M MISC Use three times daily to check blood sugar. 10/18/15   [name], MD  pantoprazole (PROTONIX) 40 MG tablet Take 40 mg by mouth daily.    [provider]  potassium chloride (KLOR-CON) 10 MEQ tablet Take 1 tablet (10 mEq total) by mouth daily. 12/08/20 08/09/21  Minna Merritts, MD  silver sulfADIAZINE (SILVADENE) 1 % cream Apply topically. 03/22/21   [provider]  tamsulosin (FLOMAX) 0.4 MG CAPS capsule Take 0.4 mg by mouth daily after breakfast.    [provider]  timolol (TIMOPTIC) 0.5 % ophthalmic solution Place 2 drops into both eyes daily.  12/19/17   [provider]  traZODone (DESYREL) 50 MG tablet trazodone 50 mg tablet    [provider]  vitamin B-12 (CYANOCOBALAMIN) 500 MCG tablet Take 500 mcg by mouth daily.    [provider]  vitamin E 180 MG (400 UNITS) capsule Take 400 Units by mouth daily.    [provider]     Allergies:    Allergies  Allergen Reactions   Jardiance [Empagliflozin]     Rash all over and lips turn purple.    Metformin And Related Diarrhea   Trazodone Other (See Comments)    Extreme hallucinations/ psychosis     Social History:   Social History   Socioeconomic History   Marital status: Widowed    Spouse name: Enid Derry, deceased   Number of children: 2   Years of education: 93   Highest education level: Not on file  Occupational History    Employer: retired  Tobacco Use   Smoking status: Former    Packs/day: 0.50     Years: 33.00    Pack years: 16.50    Types: Cigarettes    Quit date: 04/21/1968    Years since quitting: 53.3   Smokeless tobacco: Never  Vaping Use   Vaping Use: Never used  Substance and Sexual  Activity   Alcohol use: No   Drug use: No   Sexual activity: Not Currently    Partners: Female    Birth control/protection: None  Other Topics Concern   Not on file  Social History Narrative   Widowed in 2014; dating again    Social Determinants of Health   Financial Resource Strain: Not on file  Food Insecurity: Not on file  Transportation Needs: Not on file  Physical Activity: Not on file  Stress: Not on file  Social Connections: Not on file  Intimate Partner Violence: Not on file    Family History:   The patient's family history includes Diabetes in his mother.    ROS:  Please see the history of present illness.  All other ROS reviewed and negative.     Physical Exam/Data:  There were no vitals filed for this visit. No intake or output data in the 24 hours ending 08/14/21 1424 Last 3 Weights 08/14/2021 08/13/2021 08/12/2021  Weight (lbs) 203 lb 0.7 oz 204 lb 9.6 oz 201 lb 11.5 oz  Weight (kg) 92.1 kg 92.806 kg 91.5 kg     There is no height or weight on file to calculate BMI.  General:  Well nourished, well developed, in no acute distress HEENT: normal Neck: + JVD Vascular: No carotid bruits; Distal pulses 2+ bilaterally   Cardiac:  normal S1, S2; RRR; no murmur  Lungs:  clear to auscultation bilaterally, no wheezing, rhonchi or rales  Abd: soft, nontender, no hepatomegaly  Ext: + edema Musculoskeletal:  No deformities, BUE and BLE strength normal and equal Skin: warm and dry  Neuro:  CNs 2-12 intact, no focal abnormalities noted Psych:  Normal affect    EKG:  The ECG that was done 08/09/21 was personally reviewed and demonstrates ST, 112, PACs, septal infarct with T wave changes inferiorly  Relevant CV Studies:  Cardiac cath 08/14/21 PV  cath Conclusions: Severe multivessel coronary artery disease, including 80-90% ostial LAD stenosis and chronic total occlusion of the proximal RCA, a seen on prior catheterization in 11/2020. Moderately elevated left heart, right heart, and pulmonary artery pressures. Moderately-severely reduced cardiac output/index. Ectatic abdominal aorta common/external iliac arteries, and common femoral arteries without significant disease.  Focal 70% right internal iliac artery stenosis is noted. 50% ostial stenosis of right renal artery.   Recommendations: Transfer to [facility] for consideration of high risk PCI to LMCA/ostial LAD.  Given heavy calcification and reduced LVEF, atherectomy and Impella support will need to be considered.  I will load the patient with clopidogrel 600 mg today, followed by 75 mg daily thereafter. Maintain net even to slightly negative fluid balance following contrast exposure today and chronic kidney disease. Escalate goal-directed medical therapy of acute on chronic HFrEF, as tolerated. Aggressive secondary prevention of coronary artery disease.   Nelva Bush, MD [facility]  Coronary Diagrams  Diagnostic Dominance: Right Intervention      Echocardiogram   1. Left ventricular ejection fraction, by estimation, is 25 to 30%. The  left ventricle has severely decreased function. The left ventricle  demonstrates global hypokinesis with severe hypokinesis of the anterior,  septal, and apical region. Inferior wall  best preserved. Left ventricular diastolic parameters are consistent with  Grade II diastolic dysfunction (pseudonormalization). The average left  ventricular global longitudinal strain is -6.3 %. The global longitudinal  strain is abnormal.   2. Right ventricular systolic function is moderately reduced. The right  ventricular size is moderately enlarged. There is mildly elevated  pulmonary artery systolic pressure. The estimated right ventricular   systolic pressure is 10.9 mmHg.   3. Left atrial size was moderately dilated.   4. The mitral valve is normal in structure. Moderate to severe mitral  valve regurgitation.   5. Tricuspid valve regurgitation is mild to moderate.   6. Aortic dilatation noted. There is mild dilatation of the aortic root,  measuring 39 mm.   7. The inferior vena cava is dilated in size with <50% respiratory  variability, suggesting right atrial pressure of 15 mmHg.    Cardiac cath 11/2020 Ost LM to Mid LM lesion is 60% stenosed. Ost LAD lesion is 70% stenosed. Prox RCA to Dist RCA lesion is 100% stenosed. Prox LAD lesion is 50% stenosed. Dist LAD lesion is 35% stenosed. 1st Diag lesion is 80% stenosed. 2nd Mrg lesion is 70% stenosed. There is moderate left ventricular systolic dysfunction. LV end diastolic pressure is moderately elevated. The left ventricular ejection fraction is 35-45% by visual estimate. Hemodynamic findings consistent with mild pulmonary hypertension.    Laboratory Data:  High Sensitivity Troponin:   Recent Labs  Lab 08/09/21 1231 08/09/21 1409 08/09/21 1658 08/09/21 2151 08/10/21 0643  TROPONINIHS 533* 580* 816* 1,183* 1,076*      Chemistry Recent Labs  Lab 08/09/21 1231 08/10/21 3235 08/11/21 1111 08/12/21 0617 08/14/21 0751  NA 137 137   < > 136 134*  K 4.6 4.1   < > 3.7 3.7  CL 99 98   < > 97* 96*  CO2 26 26   < > 28 27  GLUCOSE 79 120*   < > 131* 100*  BUN 26* 28*   < > 24* 33*  CREATININE 1.31* 1.40*   < > 1.26* 1.38*  CALCIUM 8.7* 8.6*   < > 8.3* 8.4*  MG 1.8 1.9  --   --   --   GFRNONAA 53* 49*   < > 56* 50*  ANIONGAP 12 13   < > 11 11   < > = values in this interval not displayed.    Recent Labs  Lab 08/09/21 1231 08/12/21 0617  PROT 7.3 7.2  ALBUMIN 2.8* 2.5*  AST 118* 70*  ALT 59* 52*  ALKPHOS 188* 145*  BILITOT 3.3* 1.7*   Lipids  Recent Labs  Lab 08/10/21 0643  CHOL 53  TRIG 54  HDL 13*  LDLCALC 29  CHOLHDL 4.1    Hematology Recent Labs  Lab 08/11/21 1111 08/14/21 0518  WBC 7.3 6.2  RBC 4.97 4.87  HGB 12.1* 12.2*  HCT 40.0 40.0  MCV 80.5 82.1  MCH 24.3* 25.1*  MCHC 30.3 30.5  RDW 19.2* 18.9*  PLT 270 250   Thyroid No results for input(s): TSH, FREET4 in the last 168 hours. BNP Recent Labs  Lab 08/09/21 1231  BNP >4,500.0*    DDimer No results for input(s): DDIMER in the last 168 hours.   Radiology/Studies:  CARDIAC CATHETERIZATION  Result Date: 08/14/2021 Conclusions: Severe multivessel coronary artery disease, including 80-90% ostial LAD stenosis and chronic total occlusion of the proximal RCA, a seen on prior catheterization in 11/2020. Moderately elevated left heart, right heart, and pulmonary artery pressures. Moderately-severely reduced cardiac output/index. Ectatic abdominal aorta common/external iliac arteries, and common femoral arteries without significant disease.  Focal 70% right internal iliac artery stenosis is noted. 50% ostial stenosis of right renal artery. Recommendations: Transfer to [facility] for consideration of high risk PCI to LMCA/ostial LAD.  Given heavy calcification and reduced LVEF,  atherectomy and Impella support will need to be considered.  I will load the patient with clopidogrel 600 mg today, followed by 75 mg daily thereafter. Maintain net even to slightly negative fluid balance following contrast exposure today and chronic kidney disease. Escalate goal-directed medical therapy of acute on chronic HFrEF, as tolerated. Aggressive secondary prevention of coronary artery disease. Nelva Bush, MD [facility]  PERIPHERAL VASCULAR CATHETERIZATION  Result Date: 08/14/2021 Conclusions: Severe multivessel coronary artery disease, including 80-90% ostial LAD stenosis and chronic total occlusion of the proximal RCA, a seen on prior catheterization in 11/2020. Moderately elevated left heart, right heart, and pulmonary artery pressures. Moderately-severely reduced  cardiac output/index. Ectatic abdominal aorta common/external iliac arteries, and common femoral arteries without significant disease.  Focal 70% right internal iliac artery stenosis is noted. 50% ostial stenosis of right renal artery. Recommendations: Transfer to [facility] for consideration of high risk PCI to LMCA/ostial LAD.  Given heavy calcification and reduced LVEF, atherectomy and Impella support will need to be considered.  I will load the patient with clopidogrel 600 mg today, followed by 75 mg daily thereafter. Maintain net even to slightly negative fluid balance following contrast exposure today and chronic kidney disease. Escalate goal-directed medical therapy of acute on chronic HFrEF, as tolerated. Aggressive secondary prevention of coronary artery disease. Nelva Bush, MD [facility]    Assessment and Plan:   NSTEMI Known Multivessel CAD - H/o of multivessel CAD previously seen by CT surgery and deemed not a good surgical candidate - presented to [facility] 10/19 with CHF. BNP and troponin elevated. EKG showed septal infarct with inferior T wave changes. Echo showed worsening EF 20-25% (prior was 35-40% 11/2020). He eventually underwent repeat heart cath showing unchanged anatomy from heart cath 11/2020 (details of cath above). Plan to transfer to [facility] for high risk PCI to LMCA/ostial LAD - Post- cath patient was loaded with Plavix followed by 56m thereafter - NPO at midnight for cath tomorrow - Continue ASA, plavix, statin, BB Risks and benefits of cardiac catheterization have been discussed with the patient.  These include bleeding, infection, kidney damage, stroke, heart attack, death.  The patient understands these risks and is willing to proceed.   Acute on chronic HFrEF ICM - EF 35-40% by echo 11/2020 - Echo this admission showed EF 20-25% with global HK and more severe HK of the anterior, septal and apical regions - No ACE/ARB/ARNI/SGLT2/MRA2 with CKD  - moderately elevated  pressures on cath - IV lasix 831mBID>>hold for now given post-cath and CKD  HLD - LDL 29 07/2021 - continue atorvastatin  DM - A1C 6.7 - SSI during admission  PAD - continue ASA and statin - lower anatomy reviewed on recent cath  CKD stage 3 - monitor with diuresis and post-cath - seems to be mildly elevated from baseline   Risk Assessment/Risk Scores:   :1}  New York Heart Association (NYHA) Functional Class NYHA Class III     Severity of Illness: The appropriate patient status for this patient is INPATIENT. Inpatient status is judged to be reasonable and necessary in order to provide the required intensity of service to ensure the patient's safety. The patient's presenting symptoms, physical exam findings, and initial radiographic and laboratory data in the context of their chronic comorbidities is felt to place them at high risk for further clinical deterioration. Furthermore, it is not anticipated that the patient will be medically stable for discharge from the hospital within 2 midnights of admission.   * I  certify that at the point of admission it is my clinical judgment that the patient will require inpatient hospital care spanning beyond 2 midnights from the point of admission due to high intensity of service, high risk for further deterioration and high frequency of surveillance required.*   For questions or updates, please contact [place] Please consult [email or web address] for contact info under     Signed, Dominic Rhome Ninfa Meeker, PA-C  08/14/2021 2:24 PM

## 2021-08-14 NOTE — Progress Notes (Signed)
PT Cancellation Note  Patient Details Name: CYPHER PAULE MRN: [id number] DOB: Jan 06, 1933   Cancelled Treatment:    Reason Eval/Treat Not Completed: Other (comment).  Chart reviewed.  Pt currently off unit for procedure.  Will re-attempt PT session at a later date/time as medically appropriate.  Leitha Bleak, PT 08/14/21, 1:23 PM

## 2021-08-14 NOTE — Progress Notes (Signed)
Progress Note  Patient Name: Henry Lindsey Date of Encounter: 08/14/2021  [facility] Cardiologist: Ida Rogue, MD   Subjective   Feeling fairly well with improved shortness of breath.  No chest pain.  She  Inpatient Medications    Scheduled Meds:  ascorbic acid  500 mg Oral Daily   aspirin EC  81 mg Oral Daily   atorvastatin  40 mg Oral Daily   cholecalciferol  1,000 Units Oral Daily   cholestyramine  4 g Oral TID WC   [START ON 08/15/2021] clopidogrel  75 mg Oral Q breakfast   gabapentin  400 mg Oral BID   heparin  5,000 Units Subcutaneous Q8H   insulin aspart  0-5 Units Subcutaneous QHS   insulin aspart  0-9 Units Subcutaneous TID WC   insulin aspart protamine- aspart  15 Units Subcutaneous BID WC   latanoprost  2 drop Both Eyes QHS   mouth rinse  15 mL Mouth Rinse BID   metoprolol succinate  100 mg Oral Daily   multivitamin with minerals  1 tablet Oral Daily   pantoprazole  40 mg Oral Daily   silver sulfADIAZINE   Topical Daily   sodium chloride flush  3 mL Intravenous Q12H   sodium chloride flush  3 mL Intravenous Q12H   tamsulosin  0.4 mg Oral QPC breakfast   timolol  2 drop Both Eyes Daily   vitamin B-12  500 mcg Oral Daily   vitamin E  400 Units Oral Daily   Continuous Infusions:  sodium chloride     PRN Meds: sodium chloride, albuterol, dextromethorphan-guaiFENesin, hydrALAZINE, HYDROcodone-acetaminophen, nitroGLYCERIN, ondansetron (ZOFRAN) IV, sodium chloride flush, traZODone   Vital Signs    Vitals:   08/14/21 1215 08/14/21 1230 08/14/21 1245 08/14/21 1319  BP: 117/60 (!) 117/57 (!) 120/57 124/74  Pulse: 68 65 67 68  Resp: 13 18 17 17   Temp:    98 F (36.7 C)  TempSrc:      SpO2: 96% 97% 98% 100%  Weight:      Height:        Intake/Output Summary (Last 24 hours) at 08/14/2021 1450 Last data filed at 08/14/2021 1416 Gross per 24 hour  Intake 480 ml  Output 2100 ml  Net -1620 ml   Last 3 Weights 08/14/2021 08/13/2021 08/12/2021   Weight (lbs) 203 lb 0.7 oz 204 lb 9.6 oz 201 lb 11.5 oz  Weight (kg) 92.1 kg 92.806 kg 91.5 kg      Telemetry    Sinus rhythm with PVCs - Personally Reviewed  ECG    No new tracing.  Physical Exam   GEN: No acute distress.   Neck: JVP approximately 8-10 cm. Cardiac: RRR, no murmurs, rubs, or gallops.  Respiratory: Mildly diminished breath sounds at the lung bases.  No wheezes or crackles. GI: Soft, nontender, non-distended  MS: Trace pretibial edema; No deformity. Neuro:  Nonfocal  Psych: Normal affect   Labs    High Sensitivity Troponin:   Recent Labs  Lab 08/09/21 1231 08/09/21 1409 08/09/21 1658 08/09/21 2151 08/10/21 0643  TROPONINIHS 533* 580* 816* 1,183* 1,076*     Chemistry Recent Labs  Lab 08/09/21 1231 08/10/21 0643 08/11/21 1111 08/12/21 0617 08/14/21 0751  NA 137 137 132* 136 134*  K 4.6 4.1 3.7 3.7 3.7  CL 99 98 97* 97* 96*  CO2 26 26 25 28 27   GLUCOSE 79 120* 156* 131* 100*  BUN 26* 28* 27* 24* 33*  CREATININE 1.31* 1.40* 1.32*  1.26* 1.38*  CALCIUM 8.7* 8.6* 8.3* 8.3* 8.4*  MG 1.8 1.9  --   --   --   PROT 7.3  --   --  7.2  --   ALBUMIN 2.8*  --   --  2.5*  --   AST 118*  --   --  70*  --   ALT 59*  --   --  52*  --   ALKPHOS 188*  --   --  145*  --   BILITOT 3.3*  --   --  1.7*  --   GFRNONAA 53* 49* 53* 56* 50*  ANIONGAP 12 13 10 11 11     Lipids  Recent Labs  Lab 08/10/21 0643  CHOL 53  TRIG 54  HDL 13*  LDLCALC 29  CHOLHDL 4.1    Hematology Recent Labs  Lab 08/11/21 0301 08/11/21 1111 08/14/21 0518  WBC 7.5 7.3 6.2  RBC 4.72 4.97 4.87  HGB 11.6* 12.1* 12.2*  HCT 38.1* 40.0 40.0  MCV 80.7 80.5 82.1  MCH 24.6* 24.3* 25.1*  MCHC 30.4 30.3 30.5  RDW 18.9* 19.2* 18.9*  PLT 247 270 250   Thyroid No results for input(s): TSH, FREET4 in the last 168 hours.  BNP Recent Labs  Lab 08/09/21 1231  BNP >4,500.0*    DDimer No results for input(s): DDIMER in the last 168 hours.   Radiology    CARDIAC  CATHETERIZATION  Result Date: 08/14/2021 Conclusions: Severe multivessel coronary artery disease, including 80-90% ostial LAD stenosis and chronic total occlusion of the proximal RCA, a seen on prior catheterization in 11/2020. Moderately elevated left heart, right heart, and pulmonary artery pressures. Moderately-severely reduced cardiac output/index. Ectatic abdominal aorta common/external iliac arteries, and common femoral arteries without significant disease.  Focal 70% right internal iliac artery stenosis is noted. 50% ostial stenosis of right renal artery. Recommendations: Transfer to [facility] for consideration of high risk PCI to LMCA/ostial LAD.  Given heavy calcification and reduced LVEF, atherectomy and Impella support will need to be considered.  I will load the patient with clopidogrel 600 mg today, followed by 75 mg daily thereafter. Maintain net even to slightly negative fluid balance following contrast exposure today and chronic kidney disease. Escalate goal-directed medical therapy of acute on chronic HFrEF, as tolerated. Aggressive secondary prevention of coronary artery disease. Nelva Bush, MD [facility]  PERIPHERAL VASCULAR CATHETERIZATION  Result Date: 08/14/2021 Conclusions: Severe multivessel coronary artery disease, including 80-90% ostial LAD stenosis and chronic total occlusion of the proximal RCA, a seen on prior catheterization in 11/2020. Moderately elevated left heart, right heart, and pulmonary artery pressures. Moderately-severely reduced cardiac output/index. Ectatic abdominal aorta common/external iliac arteries, and common femoral arteries without significant disease.  Focal 70% right internal iliac artery stenosis is noted. 50% ostial stenosis of right renal artery. Recommendations: Transfer to [facility] for consideration of high risk PCI to LMCA/ostial LAD.  Given heavy calcification and reduced LVEF, atherectomy and Impella support will need to be considered.  I  will load the patient with clopidogrel 600 mg today, followed by 75 mg daily thereafter. Maintain net even to slightly negative fluid balance following contrast exposure today and chronic kidney disease. Escalate goal-directed medical therapy of acute on chronic HFrEF, as tolerated. Aggressive secondary prevention of coronary artery disease. Nelva Bush, MD [facility]   Cardiac Studies   TTE (08/10/2021):  1. Left ventricular ejection fraction, by estimation, is 25 to 30%. The  left ventricle has severely decreased function.  The left ventricle  demonstrates global hypokinesis with severe hypokinesis of the anterior,  septal, and apical region. Inferior wall  best preserved. Left ventricular diastolic parameters are consistent with  Grade II diastolic dysfunction (pseudonormalization). The average left  ventricular global longitudinal strain is -6.3 %. The global longitudinal  strain is abnormal.   2. Right ventricular systolic function is moderately reduced. The right  ventricular size is moderately enlarged. There is mildly elevated  pulmonary artery systolic pressure. The estimated right ventricular  systolic pressure is 28.3 mmHg.   3. Left atrial size was moderately dilated.   4. The mitral valve is normal in structure. Moderate to severe mitral  valve regurgitation.   5. Tricuspid valve regurgitation is mild to moderate.   6. Aortic dilatation noted. There is mild dilatation of the aortic root,  measuring 39 mm.   7. The inferior vena cava is dilated in size with <50% respiratory  variability, suggesting right atrial pressure of 15 mmHg.  Patient Profile     87 y.o. male with a history of CAD, ICM, HFrEF, HTN, HL, DMII, GERD, CVA, PAD, and carotid arterial dzs, who presented October 19 due to progressive dyspnea, increasing abdominal girth, lower extremity edema.  Elevated troponin to 1183.  EF 25 to 30% with global hypokinesis and severe anterior, septal, and apical  hypokinesis on echo this admission.  Assessment & Plan    Acute on chronic HFrEF due to ischemic cardiomyopathy: Patient reports feeling better clinically though right and left heart catheterization today shows moderately elevated left and right heart pressures with moderately-severely reduced cardiac output.  Reduced EF likely driven by severe ischemic cardiomyopathy. -Maintain net even to slightly negative fluid balance in setting of contrast exposure today and plans for further dye exposure tomorrow (see details below). -Continue metoprolol succinate 100 mg daily. -Anticipate adding ACE inhibitor/ARB/ARNI prior to discharge if renal function is stable and blood pressure tolerates.  Coronary artery disease demand ischemia: I suspect elevated troponin on admission reflects supply-demand mismatch in the setting of acute on chronic HFrEF due to severe ischemic cardiomyopathy.  Catheterization today shows similar findings to catheterization earlier this year with severe ostial LAD disease and chronic total occlusion of the RCA.  Patient was previously turned down for CABG and has failed medical therapy.  Dr. Sophronia Simas and I have spoken with Mr. Parkey and his family at length regarding risks and benefits of high risk intervention that would likely require Impella support and atherectomy of the ostial LAD and stenting back into the left main.  Mr. Frith and his family wish to move forward with this. -Transfer to [facility] for high risk PCI as soon as tomorrow.  Risk of procedure including death, MI, worsening heart failure, and worsening renal failure were discussed with the patient and his family.  They wish to proceed. -Loaded with clopidogrel 600 mg today followed by 75 mg daily thereafter. -Aggressive secondary prevention including statin therapy.  Chronic kidney disease: Renal function near baseline.  We will need to monitor closely in the setting of contrast exposure today. -Daily BMP. -Maintain  net even to slightly negative fluid balance. -Avoid nephrotoxic agents.   For questions or updates, please contact [place] Please consult [email or web address] for contact info under [facility] Cardiology.     Signed, Nelva Bush, MD  08/14/2021, 2:50 PM

## 2021-08-14 NOTE — H&P (View-Only) (Signed)
Progress Note  Patient Name: Henry Lindsey Date of Encounter: 08/14/2021  [facility] Cardiologist: Ida Rogue, MD   Subjective   Feeling fairly well with improved shortness of breath.  No chest pain.  She  Inpatient Medications    Scheduled Meds:  ascorbic acid  500 mg Oral Daily   aspirin EC  81 mg Oral Daily   atorvastatin  40 mg Oral Daily   cholecalciferol  1,000 Units Oral Daily   cholestyramine  4 g Oral TID WC   [START ON 08/15/2021] clopidogrel  75 mg Oral Q breakfast   gabapentin  400 mg Oral BID   heparin  5,000 Units Subcutaneous Q8H   insulin aspart  0-5 Units Subcutaneous QHS   insulin aspart  0-9 Units Subcutaneous TID WC   insulin aspart protamine- aspart  15 Units Subcutaneous BID WC   latanoprost  2 drop Both Eyes QHS   mouth rinse  15 mL Mouth Rinse BID   metoprolol succinate  100 mg Oral Daily   multivitamin with minerals  1 tablet Oral Daily   pantoprazole  40 mg Oral Daily   silver sulfADIAZINE   Topical Daily   sodium chloride flush  3 mL Intravenous Q12H   sodium chloride flush  3 mL Intravenous Q12H   tamsulosin  0.4 mg Oral QPC breakfast   timolol  2 drop Both Eyes Daily   vitamin B-12  500 mcg Oral Daily   vitamin E  400 Units Oral Daily   Continuous Infusions:  sodium chloride     PRN Meds: sodium chloride, albuterol, dextromethorphan-guaiFENesin, hydrALAZINE, HYDROcodone-acetaminophen, nitroGLYCERIN, ondansetron (ZOFRAN) IV, sodium chloride flush, traZODone   Vital Signs    Vitals:   08/14/21 1215 08/14/21 1230 08/14/21 1245 08/14/21 1319  BP: 117/60 (!) 117/57 (!) 120/57 124/74  Pulse: 68 65 67 68  Resp: 13 18 17 17   Temp:    98 F (36.7 C)  TempSrc:      SpO2: 96% 97% 98% 100%  Weight:      Height:        Intake/Output Summary (Last 24 hours) at 08/14/2021 1450 Last data filed at 08/14/2021 1416 Gross per 24 hour  Intake 480 ml  Output 2100 ml  Net -1620 ml   Last 3 Weights 08/14/2021 08/13/2021 08/12/2021   Weight (lbs) 203 lb 0.7 oz 204 lb 9.6 oz 201 lb 11.5 oz  Weight (kg) 92.1 kg 92.806 kg 91.5 kg      Telemetry    Sinus rhythm with PVCs - Personally Reviewed  ECG    No new tracing.  Physical Exam   GEN: No acute distress.   Neck: JVP approximately 8-10 cm. Cardiac: RRR, no murmurs, rubs, or gallops.  Respiratory: Mildly diminished breath sounds at the lung bases.  No wheezes or crackles. GI: Soft, nontender, non-distended  MS: Trace pretibial edema; No deformity. Neuro:  Nonfocal  Psych: Normal affect   Labs    High Sensitivity Troponin:   Recent Labs  Lab 08/09/21 1231 08/09/21 1409 08/09/21 1658 08/09/21 2151 08/10/21 0643  TROPONINIHS 533* 580* 816* 1,183* 1,076*     Chemistry Recent Labs  Lab 08/09/21 1231 08/10/21 0643 08/11/21 1111 08/12/21 0617 08/14/21 0751  NA 137 137 132* 136 134*  K 4.6 4.1 3.7 3.7 3.7  CL 99 98 97* 97* 96*  CO2 26 26 25 28 27   GLUCOSE 79 120* 156* 131* 100*  BUN 26* 28* 27* 24* 33*  CREATININE 1.31* 1.40* 1.32*  1.26* 1.38*  CALCIUM 8.7* 8.6* 8.3* 8.3* 8.4*  MG 1.8 1.9  --   --   --   PROT 7.3  --   --  7.2  --   ALBUMIN 2.8*  --   --  2.5*  --   AST 118*  --   --  70*  --   ALT 59*  --   --  52*  --   ALKPHOS 188*  --   --  145*  --   BILITOT 3.3*  --   --  1.7*  --   GFRNONAA 53* 49* 53* 56* 50*  ANIONGAP 12 13 10 11 11     Lipids  Recent Labs  Lab 08/10/21 0643  CHOL 53  TRIG 54  HDL 13*  LDLCALC 29  CHOLHDL 4.1    Hematology Recent Labs  Lab 08/11/21 0301 08/11/21 1111 08/14/21 0518  WBC 7.5 7.3 6.2  RBC 4.72 4.97 4.87  HGB 11.6* 12.1* 12.2*  HCT 38.1* 40.0 40.0  MCV 80.7 80.5 82.1  MCH 24.6* 24.3* 25.1*  MCHC 30.4 30.3 30.5  RDW 18.9* 19.2* 18.9*  PLT 247 270 250   Thyroid No results for input(s): TSH, FREET4 in the last 168 hours.  BNP Recent Labs  Lab 08/09/21 1231  BNP >4,500.0*    DDimer No results for input(s): DDIMER in the last 168 hours.   Radiology    CARDIAC  CATHETERIZATION  Result Date: 08/14/2021 Conclusions: Severe multivessel coronary artery disease, including 80-90% ostial LAD stenosis and chronic total occlusion of the proximal RCA, a seen on prior catheterization in 11/2020. Moderately elevated left heart, right heart, and pulmonary artery pressures. Moderately-severely reduced cardiac output/index. Ectatic abdominal aorta common/external iliac arteries, and common femoral arteries without significant disease.  Focal 70% right internal iliac artery stenosis is noted. 50% ostial stenosis of right renal artery. Recommendations: Transfer to [facility] for consideration of high risk PCI to LMCA/ostial LAD.  Given heavy calcification and reduced LVEF, atherectomy and Impella support will need to be considered.  I will load the patient with clopidogrel 600 mg today, followed by 75 mg daily thereafter. Maintain net even to slightly negative fluid balance following contrast exposure today and chronic kidney disease. Escalate goal-directed medical therapy of acute on chronic HFrEF, as tolerated. Aggressive secondary prevention of coronary artery disease. Nelva Bush, MD [facility]  PERIPHERAL VASCULAR CATHETERIZATION  Result Date: 08/14/2021 Conclusions: Severe multivessel coronary artery disease, including 80-90% ostial LAD stenosis and chronic total occlusion of the proximal RCA, a seen on prior catheterization in 11/2020. Moderately elevated left heart, right heart, and pulmonary artery pressures. Moderately-severely reduced cardiac output/index. Ectatic abdominal aorta common/external iliac arteries, and common femoral arteries without significant disease.  Focal 70% right internal iliac artery stenosis is noted. 50% ostial stenosis of right renal artery. Recommendations: Transfer to [facility] for consideration of high risk PCI to LMCA/ostial LAD.  Given heavy calcification and reduced LVEF, atherectomy and Impella support will need to be considered.  I  will load the patient with clopidogrel 600 mg today, followed by 75 mg daily thereafter. Maintain net even to slightly negative fluid balance following contrast exposure today and chronic kidney disease. Escalate goal-directed medical therapy of acute on chronic HFrEF, as tolerated. Aggressive secondary prevention of coronary artery disease. Nelva Bush, MD [facility]   Cardiac Studies   TTE (08/10/2021):  1. Left ventricular ejection fraction, by estimation, is 25 to 30%. The  left ventricle has severely decreased function.  The left ventricle  demonstrates global hypokinesis with severe hypokinesis of the anterior,  septal, and apical region. Inferior wall  best preserved. Left ventricular diastolic parameters are consistent with  Grade II diastolic dysfunction (pseudonormalization). The average left  ventricular global longitudinal strain is -6.3 %. The global longitudinal  strain is abnormal.   2. Right ventricular systolic function is moderately reduced. The right  ventricular size is moderately enlarged. There is mildly elevated  pulmonary artery systolic pressure. The estimated right ventricular  systolic pressure is 67.2 mmHg.   3. Left atrial size was moderately dilated.   4. The mitral valve is normal in structure. Moderate to severe mitral  valve regurgitation.   5. Tricuspid valve regurgitation is mild to moderate.   6. Aortic dilatation noted. There is mild dilatation of the aortic root,  measuring 39 mm.   7. The inferior vena cava is dilated in size with <50% respiratory  variability, suggesting right atrial pressure of 15 mmHg.  Patient Profile     87 y.o. male with a history of CAD, ICM, HFrEF, HTN, HL, DMII, GERD, CVA, PAD, and carotid arterial dzs, who presented October 19 due to progressive dyspnea, increasing abdominal girth, lower extremity edema.  Elevated troponin to 1183.  EF 25 to 30% with global hypokinesis and severe anterior, septal, and apical  hypokinesis on echo this admission.  Assessment & Plan    Acute on chronic HFrEF due to ischemic cardiomyopathy: Patient reports feeling better clinically though right and left heart catheterization today shows moderately elevated left and right heart pressures with moderately-severely reduced cardiac output.  Reduced EF likely driven by severe ischemic cardiomyopathy. -Maintain net even to slightly negative fluid balance in setting of contrast exposure today and plans for further dye exposure tomorrow (see details below). -Continue metoprolol succinate 100 mg daily. -Anticipate adding ACE inhibitor/ARB/ARNI prior to discharge if renal function is stable and blood pressure tolerates.  Coronary artery disease demand ischemia: I suspect elevated troponin on admission reflects supply-demand mismatch in the setting of acute on chronic HFrEF due to severe ischemic cardiomyopathy.  Catheterization today shows similar findings to catheterization earlier this year with severe ostial LAD disease and chronic total occlusion of the RCA.  Patient was previously turned down for CABG and has failed medical therapy.  Dr. Sophronia Simas and I have spoken with Mr. Wymer and his family at length regarding risks and benefits of high risk intervention that would likely require Impella support and atherectomy of the ostial LAD and stenting back into the left main.  Mr. Michalec and his family wish to move forward with this. -Transfer to [facility] for high risk PCI as soon as tomorrow.  Risk of procedure including death, MI, worsening heart failure, and worsening renal failure were discussed with the patient and his family.  They wish to proceed. -Loaded with clopidogrel 600 mg today followed by 75 mg daily thereafter. -Aggressive secondary prevention including statin therapy.  Chronic kidney disease: Renal function near baseline.  We will need to monitor closely in the setting of contrast exposure today. -Daily BMP. -Maintain  net even to slightly negative fluid balance. -Avoid nephrotoxic agents.   For questions or updates, please contact [place] Please consult [email or web address] for contact info under [facility] Cardiology.     Signed, Nelva Bush, MD  08/14/2021, 2:50 PM

## 2021-08-14 NOTE — Interval H&P Note (Signed)
History and Physical Interval Note:  08/14/2021 10:42 AM  Henry Lindsey  has presented today for surgery, with the diagnosis of coronary artery disease and acute on chronic HFrEF.  The various methods of treatment have been discussed with the patient and family. After consideration of risks, benefits and other options for treatment, the patient has consented to  Procedure(s): RIGHT/LEFT HEART CATH AND CORONARY ANGIOGRAPHY (N/A) as a surgical intervention.  The patient's history has been reviewed, patient examined, no change in status, stable for surgery.  I have reviewed the patient's chart and labs.  Questions were answered to the patient's satisfaction.    Cath Lab Visit (complete for each Cath Lab visit)  Clinical Evaluation Leading to the Procedure:   ACS: Yes.    Non-ACS:  N/A  Henry Lindsey

## 2021-08-14 NOTE — Progress Notes (Signed)
TR band deflated and removed without complications/ gauze dressing applied/ will monitor site.

## 2021-08-14 NOTE — TOC Progression Note (Signed)
Transition of Care [facility]) - Progression Note    Patient Details  Name: Henry Lindsey MRN: [id number] Date of Birth: 10-16-1933  Transition of Care [facility]) CM/SW [place], [place] Phone Number: 08/14/2021, 1:56 PM  Clinical Narrative:     CSW notes patient will transfer to [facility] today.   TOC at [facility] will continue to follow for any new home O2 needs and home health needs at time of patient discharge.   Expected Discharge Plan: [place] Barriers to Discharge: Continued Medical Work up  Expected Discharge Plan and Services Expected Discharge Plan: [place] In-house Referral: Clinical Social Work   Post Acute Care Choice: [place] arrangements for the past 2 months: Single Family Home Expected Discharge Date: 08/14/21                                     Social Determinants of Health (SDOH) Interventions    Readmission Risk Interventions No flowsheet data found.

## 2021-08-14 NOTE — Discharge Summary (Signed)
[name]    MR#:  [id number]  DATE OF BIRTH:  28-Feb-1933  DATE OF ADMISSION:  08/09/2021 ADMITTING PHYSICIAN: Ivor Costa, MD  DATE OF DISCHARGE: 08/14/2021  PRIMARY CARE PHYSICIAN: Baxter Hire, MD    ADMISSION DIAGNOSIS:  Bilateral leg edema [R60.0] NSTEMI (non-ST elevated myocardial infarction) ([place]) [I21.4] Acute on chronic systolic congestive heart failure (HCC) [I50.23] Acute on chronic systolic CHF (congestive heart failure) ([place]) [I50.23]  DISCHARGE DIAGNOSIS:  NSTEMI s/p cath--needs w/u for complex CAD at [facility] Acute on Chronic Systolic CHF  SECONDARY DIAGNOSIS:   Past Medical History:  Diagnosis Date  . Arthritis   . BPH (benign prostatic hyperplasia)   . CAD (coronary artery disease)    a. 11/2020 Cath: LM 50d, LAD sev ost dzs, mod to sev prox/mid dzs. D1 sev dzs. LCX mild prox dzs, OM2 mod-sev dzs, RCA 100p. RPL and RPDA fill via L-->R collats. EF 35%. Seen by CT surgery-->Med Rx advised.  . Carotid artery stenosis    a. 11/2010 U/S: <50% bilaterally; b. 03/2021 40-59% bilat ICA stenoses.  [name] COPD (chronic obstructive pulmonary disease) ([place])    noted CT 10/05/16 former smoker quit age 55   . CVA (cerebral infarction) 11/2010   r thalamic lacunar  . Diabetes mellitus   . GERD (gastroesophageal reflux disease)   . HFrEF (heart failure with reduced ejection fraction) ([place])    a. 10/2020 Echo: EF 35%.  . Hyperlipidemia   . Hypertension   . Ischemic cardiomyopathy    a. 10/2020 Echo: EF 35%. Nl RVSP. Mod MR. Mild TR; b. 11/2020 Cath:  CO/CI 5.05/2.49. LV gram: EF 35%.  . Lower extremity cellulitis   . Neuromuscular disorder ([place])   . Peripheral vascular disease in diabetes mellitus ([place])    a. 06/2012 95% occlusion s/p PTA R SFA (Dr. Lucky Cowboy); b. 03/2021 ABIs: R 1.02, L 0.96.  [name] Stroke [facility])     HOSPITAL COURSE:   Henry Lindsey is a 87 y.o. male with medical history significant of sCHF  with EF 35%, hypertension, hyperlipidemia, diabetes mellitus, COPD, stroke, GERD, PVD, (s/p for right PTA SFA), CAD, BPH, carotid artery stenosis, CKD 3, diverticulitis, who presents with shortness of breath.  Patient states that he has shortness of breath for almost a week, which has been progressively worsening. Patient was found to have oxygen desaturation to 88% on room air, which improved to 99% on 4 L oxygen.  Patient is normally not wearing oxygen at home.   Acute respiratory failure with hypoxia due to acute on chronic systolic CHF:  --2D echo 6/80/3212 showed EF of 35%.   --came in with leg edema, positive JVD, elevated BNP > 4500, crackles on auscultation, clinically consistent with CHF exacerbation.   --Dr. Rockey Situ of cardiology is consulted due to elevated troponin -Lasix 80 mg bid per card --UOP document needs to be accurate -strict I/O's -Low salt diet -Fluid restriction --soft BP and CKD--cannot do ACE inhibitors --10/22--UOP 2030. Cont IV lasix --10/23--improving slowly. Per card cont IV Lasix --10/24 cont lasix. Assess for home oxygen   NSTEMI/Elevated troponin and hx of CAD: trop 533 --> 580.  No chest pain, likely due to demand ischemia,with chf -IV heparin started in ED--cont for 48 hours per cards rec -Aspirin, Lipitor -As needed nitroglycerin --10/25--underwent cardiac cathSevere multivessel coronary artery disease, including 80-90% ostial LAD stenosis and chronic total occlusion of the proximal RCA, a seen on prior  catheterization in 11/2020. Moderately elevated left heart, right heart, and pulmonary artery pressures. Moderately-severely reduced cardiac output/index. Ectatic abdominal aorta common/external iliac arteries, and common femoral arteries without significant disease.  Focal 70% right internal iliac artery stenosis is noted. 50% ostial stenosis of right renal artery.   Recommendations: [facility] to [facility] for consideration of high risk PCI to LMCA/ostial  LAD.  Given heavy calcification and reduced LVEF, atherectomy and Impella support will need to be considered.  I will load the patient with clopidogrel 600 mg today, followed by 75 mg daily thereafter. Maintain net even to slightly negative fluid balance following contrast exposure today and chronic kidney disease. Escalate goal-directed medical therapy of acute on chronic HFrEF, as tolerated. Aggressive secondary prevention of coronary artery disease.   Hyperlipidemia -Lipitor   Hypertension -IV hydralazine as needed -Metoprolol   History of CVA (cerebrovascular accident) -Aspirin, Lipitor   Peripheral vascular disease in diabetes mellitus (HCC) -Cilostazol   BPH (benign prostatic hyperplasia) -Flomax   Type II diabetes mellitus with renal manifestations ([place]), well controlled -Sliding scale insulin - 70/30 insulin  15 units bid --Glipizide on hold for now   COPD (chronic obstructive pulmonary disease) ([place]): No wheezing or rhonchi. -Bronchodilators   CKD (chronic kidney disease), stage IIIa: Stable -creat 1.2   Abnormal LFTs: Likely due to congestion secondary to CHF exacerbation -Avoid using Tylenol   assess for home oxygen PT/OT--No PT needs    pt to transfer to [facility] under [facility] cardiology service. Dr Burt Knack accepting  MD   Procedures:none Family communication spoke with dter Candace  Consults :[facility] cardiology CODE STATUS:DNR per d/w Dr Blaine Hamper  DVT Prophylaxis :heparin gtt Level of care: Progressive Cardiac Status is: Inpatient           CONSULTS OBTAINED:  Treatment Team:  Minna Merritts, MD  DRUG ALLERGIES:   Allergies  Allergen Reactions  . Jardiance [Empagliflozin]     Rash all over and lips turn purple.   . Metformin And Related Diarrhea  . Trazodone Other (See Comments)    Extreme hallucinations/ psychosis     DISCHARGE MEDICATIONS:   Allergies as of 08/14/2021       Reactions   Jardiance [empagliflozin]    Rash all over and lips turn  purple.    Metformin And Related Diarrhea   Trazodone Other (See Comments)   Extreme hallucinations/ psychosis         Medication List     STOP taking these medications    glipiZIDE 10 MG tablet Commonly known as: GLUCOTROL   HumaLOG Mix 75/25 (75-25) 100 UNIT/ML Susp injection Generic drug: insulin lispro protamine-lispro   insulin NPH-regular Human (70-30) 100 UNIT/ML injection Replaced by: insulin aspart protamine- aspart (70-30) 100 UNIT/ML injection       TAKE these medications    albuterol 108 (90 Base) MCG/ACT inhaler Commonly known as: VENTOLIN HFA Inhale 2 puffs into the lungs every 6 (six) hours as needed.   ascorbic acid 500 MG tablet Commonly known as: VITAMIN C Take 500 mg by mouth daily.   aspirin EC 81 MG tablet Take 1 tablet (81 mg total) by mouth daily. Swallow whole.   atorvastatin 40 MG tablet Commonly known as: LIPITOR Take 1 tablet (40 mg total) by mouth daily.   cholecalciferol 25 MCG (1000 UNIT) tablet Commonly known as: VITAMIN D Take 1,000 Units by mouth daily.   cholestyramine 4 g packet Commonly known as: QUESTRAN Take 4 g by mouth 3 (three) times daily with  meals.   cilostazol 100 MG tablet Commonly known as: PLETAL Take 100 mg by mouth 2 (two) times daily.   clopidogrel 75 MG tablet Commonly known as: PLAVIX Take 1 tablet (75 mg total) by mouth daily with breakfast. Start taking on: August 15, 2021   diclofenac Sodium 1 % Gel Commonly known as: VOLTAREN Apply 2 g topically 2 (two) times daily as needed (pain).   furosemide 40 MG tablet Commonly known as: LASIX Take 1 tablet (40 mg total) by mouth daily. May take an additional 1/2 tab for wt gain of 2 lbs in 24 hrs.   gabapentin 400 MG capsule Commonly known as: NEURONTIN Take 400 mg by mouth 2 (two) times daily.   HYDROcodone-acetaminophen 5-325 MG tablet Commonly known as: NORCO/VICODIN Take 1 tablet by mouth every 6 (six) hours as needed.   INS SYRINGE/NEEDLE  1CC/28G 28G X 1/2" 1 ML Misc Commonly known as: B-D INSULIN SYRINGE 1CC/28G USE TO ADMINISTER INSULIN DAILY   insulin aspart protamine- aspart (70-30) 100 UNIT/ML injection Commonly known as: NOVOLOG MIX 70/30 Inject 0.15 mLs (15 Units total) into the skin 2 (two) times daily with a meal. Replaces: insulin NPH-regular Human (70-30) 100 UNIT/ML injection   latanoprost 0.005 % ophthalmic solution Commonly known as: XALATAN Place 2 drops into both eyes at bedtime.   metoprolol succinate 100 MG 24 hr tablet Commonly known as: TOPROL-XL Take 1 tablet (100 mg total) by mouth daily. Take with or immediately following a meal.   multivitamin with minerals Tabs tablet Take 1 tablet by mouth daily.   nitroGLYCERIN 0.4 MG SL tablet Commonly known as: NITROSTAT Place 1 tablet (0.4 mg total) under the tongue every 5 (five) minutes as needed for chest pain. MAXIMUM 3 TABLETS   ONE TOUCH ULTRA SYSTEM KIT w/Device Kit 1 kit by Does not apply route once. Use DX code E11.59   ONE TOUCH ULTRA TEST test strip Generic drug: glucose blood USE 1 STRIP TO TEST THREE TIMES DAILY AS DIRECTED   OneTouch Delica Lancets 82G Misc Use three times daily to check blood sugar.   pantoprazole 40 MG tablet Commonly known as: PROTONIX Take 40 mg by mouth daily.   potassium chloride 10 MEQ tablet Commonly known as: KLOR-CON Take 1 tablet (10 mEq total) by mouth daily.   silver sulfADIAZINE 1 % cream Commonly known as: SILVADENE Apply topically.   tamsulosin 0.4 MG Caps capsule Commonly known as: FLOMAX Take 0.4 mg by mouth daily after breakfast.   timolol 0.5 % ophthalmic solution Commonly known as: TIMOPTIC Place 2 drops into both eyes daily.   traZODone 50 MG tablet Commonly known as: DESYREL trazodone 50 mg tablet   vitamin B-12 500 MCG tablet Commonly known as: CYANOCOBALAMIN Take 500 mcg by mouth daily.   vitamin E 180 MG (400 UNITS) capsule Take 400 Units by mouth daily.                Durable Medical Equipment  (From admission, onward)           Start     Ordered   08/13/21 1017  For home use only DME oxygen  Once       Question Answer Comment  Length of Need Lifetime   Mode or (Route) Nasal cannula   Liters per Minute 2   Frequency Continuous (stationary and portable oxygen unit needed)   Oxygen conserving device Yes   Oxygen delivery system Gas      08/13/21 1017  If you experience worsening of your admission symptoms, develop shortness of breath, life threatening emergency, suicidal or homicidal thoughts you must seek medical attention immediately by calling 911 or calling your MD immediately  if symptoms less severe.  You Must read complete instructions/literature along with all the possible adverse reactions/side effects for all the Medicines you take and that have been prescribed to you. Take any new Medicines after you have completely understood and accept all the possible adverse reactions/side effects.   Please note  You were cared for by a hospitalist during your hospital stay. If you have any questions about your discharge medications or the care you received while you were in the hospital after you are discharged, you can call the unit and asked to speak with the hospitalist on call if the hospitalist that took care of you is not available. Once you are discharged, your primary care physician will handle any further medical issues. Please note that NO REFILLS for any discharge medications will be authorized once you are discharged, as it is imperative that you return to your primary care physician (or establish a relationship with a primary care physician if you do not have one) for your aftercare needs so that they can reassess your need for medications and monitor your lab values. Today   SUBJECTIVE   Seen in specials recovery. Doing well post cath  VITAL SIGNS:  Blood pressure 124/74, pulse 68, temperature 98 F (36.7 C),  resp. rate 17, height $RemoveBe'5\' 11"'gPxQcAkxc$  (1.803 m), weight 92.1 kg, SpO2 100 %.  I/O:   Intake/Output Summary (Last 24 hours) at 08/14/2021 1324 Last data filed at 08/14/2021 0827 Gross per 24 hour  Intake 480 ml  Output 1150 ml  Net -670 ml    PHYSICAL EXAMINATION:  GENERAL:  87 y.o.-year-old patient lying in the bed with no acute distress.  LUNGS: Normal breath sounds bilaterally, no wheezing, rales,rhonchi or crepitation. No use of accessory muscles of respiration.  CARDIOVASCULAR: S1, S2 normal. No murmurs, rubs, or gallops.  ABDOMEN: Soft, non-tender, non-distended. Bowel sounds present. No organomegaly or mass.  EXTREMITIES: No pedal edema, cyanosis, or clubbing.  NEUROLOGIC: Cranial nerves II through XII are intact. Muscle strength 5/5 in all extremities. Sensation intact. Gait not checked.  PSYCHIATRIC: The patient is alert and oriented x 3.  SKIN: No obvious rash, lesion, or ulcer.   DATA REVIEW:   CBC  Recent Labs  Lab 08/14/21 0518  WBC 6.2  HGB 12.2*  HCT 40.0  PLT 250    Chemistries  Recent Labs  Lab 08/10/21 0643 08/11/21 1111 08/12/21 0617 08/14/21 0751  NA 137   < > 136 134*  K 4.1   < > 3.7 3.7  CL 98   < > 97* 96*  CO2 26   < > 28 27  GLUCOSE 120*   < > 131* 100*  BUN 28*   < > 24* 33*  CREATININE 1.40*   < > 1.26* 1.38*  CALCIUM 8.6*   < > 8.3* 8.4*  MG 1.9  --   --   --   AST  --   --  70*  --   ALT  --   --  52*  --   ALKPHOS  --   --  145*  --   BILITOT  --   --  1.7*  --    < > = values in this interval not displayed.    Microbiology Results   Recent Results (from  the past 240 hour(s))  Resp Panel by RT-PCR (Flu A&B, Covid) Nasopharyngeal Swab     Status: None   Collection Time: 08/09/21 12:31 PM   Specimen: Nasopharyngeal Swab; Nasopharyngeal(NP) swabs in vial transport medium  Result Value Ref Range Status   SARS Coronavirus 2 by RT PCR NEGATIVE NEGATIVE Final    Comment: (NOTE) SARS-CoV-2 target nucleic acids are NOT DETECTED.  The  SARS-CoV-2 RNA is generally detectable in upper respiratory specimens during the acute phase of infection. The lowest concentration of SARS-CoV-2 viral copies this assay can detect is 138 copies/mL. A negative result does not preclude SARS-Cov-2 infection and should not be used as the sole basis for treatment or other patient management decisions. A negative result may occur with  improper specimen collection/handling, submission of specimen other than nasopharyngeal swab, presence of viral mutation(s) within the areas targeted by this assay, and inadequate number of viral copies(<138 copies/mL). A negative result must be combined with clinical observations, patient history, and epidemiological information. The expected result is Negative.  Fact Sheet for Patients:  EntrepreneurPulse.com.au  Fact Sheet for Healthcare Providers:  IncredibleEmployment.be  This test is no t yet approved or cleared by the [place] FDA and  has been authorized for detection and/or diagnosis of SARS-CoV-2 by FDA under an Emergency Use Authorization (EUA). This EUA will remain  in effect (meaning this test can be used) for the duration of the COVID-19 declaration under Section 564(b)(1) of the Act, 21 U.S.C.section 360bbb-3(b)(1), unless the authorization is terminated  or revoked sooner.       Influenza A by PCR NEGATIVE NEGATIVE Final   Influenza B by PCR NEGATIVE NEGATIVE Final    Comment: (NOTE) The Xpert Xpress SARS-CoV-2/FLU/RSV plus assay is intended as an aid in the diagnosis of influenza from Nasopharyngeal swab specimens and should not be used as a sole basis for treatment. Nasal washings and aspirates are unacceptable for Xpert Xpress SARS-CoV-2/FLU/RSV testing.  Fact Sheet for Patients: EntrepreneurPulse.com.au  Fact Sheet for Healthcare Providers: IncredibleEmployment.be  This test is not yet approved or  cleared by the [place] FDA and has been authorized for detection and/or diagnosis of SARS-CoV-2 by FDA under an Emergency Use Authorization (EUA). This EUA will remain in effect (meaning this test can be used) for the duration of the COVID-19 declaration under Section 564(b)(1) of the Act, 21 U.S.C. section 360bbb-3(b)(1), unless the authorization is terminated or revoked.  Performed at [facility], [address] [place], [place] [address]     RADIOLOGY:  CARDIAC CATHETERIZATION  Result Date: 08/14/2021 Conclusions: Severe multivessel coronary artery disease, including 80-90% ostial LAD stenosis and chronic total occlusion of the proximal RCA, a seen on prior catheterization in 11/2020. Moderately elevated left heart, right heart, and pulmonary artery pressures. Moderately-severely reduced cardiac output/index. Ectatic abdominal aorta common/external iliac arteries, and common femoral arteries without significant disease.  Focal 70% right internal iliac artery stenosis is noted. 50% ostial stenosis of right renal artery. Recommendations: Transfer to [facility] for consideration of high risk PCI to LMCA/ostial LAD.  Given heavy calcification and reduced LVEF, atherectomy and Impella support will need to be considered.  I will load the patient with clopidogrel 600 mg today, followed by 75 mg daily thereafter. Maintain net even to slightly negative fluid balance following contrast exposure today and chronic kidney disease. Escalate goal-directed medical therapy of acute on chronic HFrEF, as tolerated. Aggressive secondary prevention of coronary artery disease. Nelva Bush, MD [facility]  PERIPHERAL VASCULAR CATHETERIZATION  Result Date: 08/14/2021 Conclusions: Severe multivessel coronary artery disease, including 80-90% ostial LAD stenosis and chronic total occlusion of the proximal RCA, a seen on prior catheterization in 11/2020. Moderately elevated left heart, right  heart, and pulmonary artery pressures. Moderately-severely reduced cardiac output/index. Ectatic abdominal aorta common/external iliac arteries, and common femoral arteries without significant disease.  Focal 70% right internal iliac artery stenosis is noted. 50% ostial stenosis of right renal artery. Recommendations: Transfer to [facility] for consideration of high risk PCI to LMCA/ostial LAD.  Given heavy calcification and reduced LVEF, atherectomy and Impella support will need to be considered.  I will load the patient with clopidogrel 600 mg today, followed by 75 mg daily thereafter. Maintain net even to slightly negative fluid balance following contrast exposure today and chronic kidney disease. Escalate goal-directed medical therapy of acute on chronic HFrEF, as tolerated. Aggressive secondary prevention of coronary artery disease. Nelva Bush, MD [facility]    CODE STATUS:     Code Status Orders  (From admission, onward)           Start     Ordered   08/09/21 1604  Do not attempt resuscitation (DNR)  Continuous        08/09/21 1603           Code Status History     Date Active Date Inactive Code Status Order ID Comments User Context   08/09/2021 1549 08/09/2021 1603 Full Code [id number]  Ivor Costa, MD ED   08/08/2020 1414 08/09/2020 2122 Full Code [id number]  Lovell Sheehan, MD Inpatient   11/15/2016 2135 11/24/2016 1823 Full Code [id number]  Vianne Bulls, MD Inpatient   11/13/2016 1800 11/14/2016 1323 Full Code [id number]  Olean Ree, MD Inpatient   10/21/2016 0319 10/23/2016 1756 Full Code [id number]  Harrie Foreman, MD Inpatient   10/05/2016 1234 10/08/2016 1810 Full Code [id number]  Rondel Jumbo, PA-C Inpatient        TOTAL TIME TAKING CARE OF THIS PATIENT: 40 minutes.    Fritzi Mandes M.D  [facility]    CC: Primary care physician; Baxter Hire, MD

## 2021-08-15 ENCOUNTER — Encounter (HOSPITAL_COMMUNITY)
Admission: AD | Disposition: A | Discharge status: Home Health | Payer: Self-pay | Source: Home / Self Care | Attending: Cardiovascular Disease

## 2021-08-15 ENCOUNTER — Other Ambulatory Visit: Payer: Self-pay

## 2021-08-15 ENCOUNTER — Inpatient Hospital Stay (HOSPITAL_COMMUNITY)
Admission: AD | Admit: 2021-08-15 | Discharge: 2021-08-20 | DRG: 216 | Disposition: A | Discharge status: Home Health | Payer: Medicare Other | Attending: Cardiovascular Disease | Admitting: Cardiovascular Disease

## 2021-08-15 ENCOUNTER — Encounter: Payer: Self-pay | Admitting: Internal Medicine

## 2021-08-15 DIAGNOSIS — I214 Non-ST elevation (NSTEMI) myocardial infarction: Principal | ICD-10-CM | POA: Diagnosis present

## 2021-08-15 DIAGNOSIS — I471 Supraventricular tachycardia: Secondary | ICD-10-CM | POA: Diagnosis present

## 2021-08-15 DIAGNOSIS — I493 Ventricular premature depolarization: Secondary | ICD-10-CM | POA: Diagnosis present

## 2021-08-15 DIAGNOSIS — I10 Essential (primary) hypertension: Secondary | ICD-10-CM | POA: Diagnosis not present

## 2021-08-15 DIAGNOSIS — I5023 Acute on chronic systolic (congestive) heart failure: Secondary | ICD-10-CM | POA: Diagnosis present

## 2021-08-15 DIAGNOSIS — Z888 Allergy status to other drugs, medicaments and biological substances status: Secondary | ICD-10-CM

## 2021-08-15 DIAGNOSIS — R0602 Shortness of breath: Secondary | ICD-10-CM

## 2021-08-15 DIAGNOSIS — I130 Hypertensive heart and chronic kidney disease with heart failure and stage 1 through stage 4 chronic kidney disease, or unspecified chronic kidney disease: Secondary | ICD-10-CM | POA: Diagnosis present

## 2021-08-15 DIAGNOSIS — I708 Atherosclerosis of other arteries: Secondary | ICD-10-CM | POA: Diagnosis present

## 2021-08-15 DIAGNOSIS — E1151 Type 2 diabetes mellitus with diabetic peripheral angiopathy without gangrene: Secondary | ICD-10-CM | POA: Diagnosis present

## 2021-08-15 DIAGNOSIS — Z79899 Other long term (current) drug therapy: Secondary | ICD-10-CM | POA: Diagnosis not present

## 2021-08-15 DIAGNOSIS — E782 Mixed hyperlipidemia: Secondary | ICD-10-CM | POA: Diagnosis not present

## 2021-08-15 DIAGNOSIS — I2582 Chronic total occlusion of coronary artery: Secondary | ICD-10-CM

## 2021-08-15 DIAGNOSIS — E1122 Type 2 diabetes mellitus with diabetic chronic kidney disease: Secondary | ICD-10-CM | POA: Diagnosis present

## 2021-08-15 DIAGNOSIS — I2584 Coronary atherosclerosis due to calcified coronary lesion: Secondary | ICD-10-CM | POA: Diagnosis not present

## 2021-08-15 DIAGNOSIS — I083 Combined rheumatic disorders of mitral, aortic and tricuspid valves: Secondary | ICD-10-CM | POA: Diagnosis present

## 2021-08-15 DIAGNOSIS — Z794 Long term (current) use of insulin: Secondary | ICD-10-CM | POA: Diagnosis not present

## 2021-08-15 DIAGNOSIS — N1831 Chronic kidney disease, stage 3a: Secondary | ICD-10-CM | POA: Diagnosis present

## 2021-08-15 DIAGNOSIS — Z7982 Long term (current) use of aspirin: Secondary | ICD-10-CM

## 2021-08-15 DIAGNOSIS — Z955 Presence of coronary angioplasty implant and graft: Secondary | ICD-10-CM

## 2021-08-15 DIAGNOSIS — I255 Ischemic cardiomyopathy: Secondary | ICD-10-CM | POA: Diagnosis present

## 2021-08-15 DIAGNOSIS — I200 Unstable angina: Secondary | ICD-10-CM

## 2021-08-15 DIAGNOSIS — I25110 Atherosclerotic heart disease of native coronary artery with unstable angina pectoris: Secondary | ICD-10-CM | POA: Diagnosis present

## 2021-08-15 DIAGNOSIS — E785 Hyperlipidemia, unspecified: Secondary | ICD-10-CM | POA: Diagnosis present

## 2021-08-15 DIAGNOSIS — I2510 Atherosclerotic heart disease of native coronary artery without angina pectoris: Secondary | ICD-10-CM

## 2021-08-15 DIAGNOSIS — J9601 Acute respiratory failure with hypoxia: Secondary | ICD-10-CM | POA: Diagnosis present

## 2021-08-15 DIAGNOSIS — Z7902 Long term (current) use of antithrombotics/antiplatelets: Secondary | ICD-10-CM | POA: Diagnosis not present

## 2021-08-15 HISTORY — PX: CORONARY STENT INTERVENTION W/IMPELLA: CATH118235

## 2021-08-15 HISTORY — PX: CORONARY ATHERECTOMY: CATH118238

## 2021-08-15 LAB — BASIC METABOLIC PANEL
Anion gap: 9 (ref 5–15)
Anion gap: 10 (ref 5–15)
BUN: 33 mg/dL — ABNORMAL HIGH (ref 8–23)
BUN: 29 mg/dL — ABNORMAL HIGH (ref 8–23)
CO2: 27 mmol/L (ref 22–32)
CO2: 26 mmol/L (ref 22–32)
Calcium: 8.3 mg/dL — ABNORMAL LOW (ref 8.9–10.3)
Calcium: 8.4 mg/dL — ABNORMAL LOW (ref 8.9–10.3)
Chloride: 97 mmol/L — ABNORMAL LOW (ref 98–111)
Chloride: 98 mmol/L (ref 98–111)
Creatinine, Ser: 1.2 mg/dL (ref 0.61–1.24)
Creatinine, Ser: 1.29 mg/dL — ABNORMAL HIGH (ref 0.61–1.24)
GFR, Estimated: 59 mL/min — ABNORMAL LOW (ref >60)
GFR, Estimated: 54 mL/min — ABNORMAL LOW (ref >60)
Glucose, Bld: 198 mg/dL — ABNORMAL HIGH (ref 70–99)
Glucose, Bld: 152 mg/dL — ABNORMAL HIGH (ref 70–99)
Potassium: 3.8 mmol/L (ref 3.5–5.1)
Potassium: 4 mmol/L (ref 3.5–5.1)
Sodium: 133 mmol/L — ABNORMAL LOW (ref 135–145)
Sodium: 134 mmol/L — ABNORMAL LOW (ref 135–145)

## 2021-08-15 LAB — CBC
HCT: 39.8 % (ref 39.0–52.0)
HCT: 40.1 % (ref 39.0–52.0)
Hemoglobin: 12.5 g/dL — ABNORMAL LOW (ref 13.0–17.0)
Hemoglobin: 11.7 g/dL — ABNORMAL LOW (ref 13.0–17.0)
MCH: 25.4 pg — ABNORMAL LOW (ref 26.0–34.0)
MCH: 24.2 pg — ABNORMAL LOW (ref 26.0–34.0)
MCHC: 31.4 g/dL (ref 30.0–36.0)
MCHC: 29.2 g/dL — ABNORMAL LOW (ref 30.0–36.0)
MCV: 80.7 fL (ref 80.0–100.0)
MCV: 82.9 fL (ref 80.0–100.0)
Platelets: 213 10*3/uL (ref 150–400)
Platelets: 204 10*3/uL (ref 150–400)
RBC: 4.93 MIL/uL (ref 4.22–5.81)
RBC: 4.84 MIL/uL (ref 4.22–5.81)
RDW: 19 % — ABNORMAL HIGH (ref 11.5–15.5)
RDW: 19.1 % — ABNORMAL HIGH (ref 11.5–15.5)
WBC: 5.5 10*3/uL (ref 4.0–10.5)
WBC: 5.2 10*3/uL (ref 4.0–10.5)
nRBC: 0 % (ref 0.0–0.2)
nRBC: 0 % (ref 0.0–0.2)

## 2021-08-15 LAB — POCT ACTIVATED CLOTTING TIME
Activated Clotting Time: 248 seconds
Activated Clotting Time: 277 seconds
Activated Clotting Time: 405 seconds

## 2021-08-15 LAB — GLUCOSE, CAPILLARY
Glucose-Capillary: 153 mg/dL — ABNORMAL HIGH (ref 70–99)
Glucose-Capillary: 209 mg/dL — ABNORMAL HIGH (ref 70–99)

## 2021-08-15 SURGERY — CORONARY ATHERECTOMY
Anesthesia: LOCAL

## 2021-08-15 MED ORDER — METOPROLOL SUCCINATE ER 100 MG PO TB24
100.0000 mg | ORAL_TABLET | Freq: Every day | ORAL | Status: DC
  Administered 2021-08-15: 100 mg via ORAL
  Filled 2021-08-15 (×2): qty 1

## 2021-08-15 MED ORDER — HEPARIN SODIUM (PORCINE) 1000 UNIT/ML IJ SOLN
INTRAMUSCULAR | Status: AC
  Filled 2021-08-15: qty 1

## 2021-08-15 MED ORDER — GABAPENTIN 400 MG PO CAPS
400.0000 mg | ORAL_CAPSULE | Freq: Two times a day (BID) | ORAL | Status: DC
  Administered 2021-08-15 – 2021-08-20 (×10): 400 mg via ORAL
  Filled 2021-08-15 (×10): qty 1

## 2021-08-15 MED ORDER — TAMSULOSIN HCL 0.4 MG PO CAPS: 0.4000 mg | ORAL_CAPSULE | Freq: Every day | ORAL | Status: DC

## 2021-08-15 MED ORDER — NITROGLYCERIN 0.4 MG SL SUBL: 0.4000 mg | SUBLINGUAL_TABLET | SUBLINGUAL | Status: DC | PRN

## 2021-08-15 MED ORDER — ATORVASTATIN CALCIUM 40 MG PO TABS: 40.0000 mg | ORAL_TABLET | Freq: Every day | ORAL | Status: DC

## 2021-08-15 MED ORDER — ASPIRIN EC 81 MG PO TBEC: 81.0000 mg | DELAYED_RELEASE_TABLET | Freq: Every day | ORAL | Status: DC

## 2021-08-15 MED ORDER — SODIUM CHLORIDE 0.9% FLUSH: 3.0000 mL | Freq: Two times a day (BID) | INTRAVENOUS | Status: DC

## 2021-08-15 MED ORDER — ALBUTEROL SULFATE (2.5 MG/3ML) 0.083% IN NEBU: 3.0000 mL | INHALATION_SOLUTION | Freq: Four times a day (QID) | RESPIRATORY_TRACT | Status: DC | PRN

## 2021-08-15 MED ORDER — ADULT MULTIVITAMIN W/MINERALS CH
1.0000 | ORAL_TABLET | Freq: Every day | ORAL | Status: DC
  Administered 2021-08-15 – 2021-08-20 (×6): 1 via ORAL
  Filled 2021-08-15 (×6): qty 1

## 2021-08-15 MED ORDER — TIMOLOL MALEATE 0.5 % OP SOLN
2.0000 [drp] | Freq: Every day | OPHTHALMIC | Status: DC
  Filled 2021-08-15: qty 5

## 2021-08-15 MED ORDER — VITAMIN B-12 100 MCG PO TABS: 500.0000 ug | ORAL_TABLET | Freq: Every day | ORAL | Status: DC

## 2021-08-15 MED ORDER — NITROGLYCERIN 1 MG/10 ML FOR IR/CATH LAB
INTRA_ARTERIAL | Status: DC | PRN
  Administered 2021-08-15: 200 ug via INTRACORONARY

## 2021-08-15 MED ORDER — VITAMIN D 25 MCG (1000 UNIT) PO TABS
1000.0000 [IU] | ORAL_TABLET | Freq: Every day | ORAL | Status: DC
  Administered 2021-08-15 – 2021-08-20 (×6): 1000 [IU] via ORAL
  Filled 2021-08-15 (×6): qty 1

## 2021-08-15 MED ORDER — ACETAMINOPHEN 325 MG PO TABS: 650.0000 mg | ORAL_TABLET | ORAL | Status: DC | PRN

## 2021-08-15 MED ORDER — IOHEXOL 350 MG/ML SOLN
INTRAVENOUS | Status: DC | PRN
  Administered 2021-08-15: 135 mL

## 2021-08-15 MED ORDER — CLOPIDOGREL BISULFATE 75 MG PO TABS
75.0000 mg | ORAL_TABLET | Freq: Every day | ORAL | Status: DC
  Administered 2021-08-16 – 2021-08-19 (×4): 75 mg via ORAL
  Filled 2021-08-15 (×4): qty 1

## 2021-08-15 MED ORDER — CLOPIDOGREL BISULFATE 75 MG PO TABS: 75.0000 mg | ORAL_TABLET | Freq: Every day | ORAL | Status: DC

## 2021-08-15 MED ORDER — METOPROLOL SUCCINATE ER 100 MG PO TB24: 100.0000 mg | ORAL_TABLET | Freq: Every day | ORAL | Status: DC

## 2021-08-15 MED ORDER — SODIUM CHLORIDE 0.9 % IV SOLN: INTRAVENOUS | Status: DC

## 2021-08-15 MED ORDER — FUROSEMIDE 40 MG PO TABS
40.0000 mg | ORAL_TABLET | Freq: Every day | ORAL | Status: DC
  Administered 2021-08-15: 40 mg via ORAL
  Filled 2021-08-15: qty 1

## 2021-08-15 MED ORDER — ASPIRIN EC 81 MG PO TBEC
81.0000 mg | DELAYED_RELEASE_TABLET | Freq: Every day | ORAL | Status: DC
  Administered 2021-08-16 – 2021-08-20 (×5): 81 mg via ORAL
  Filled 2021-08-15 (×5): qty 1

## 2021-08-15 MED ORDER — SODIUM CHLORIDE 0.9% FLUSH: 3.0000 mL | INTRAVENOUS | Status: DC | PRN

## 2021-08-15 MED ORDER — LATANOPROST 0.005 % OP SOLN
2.0000 [drp] | Freq: Every day | OPHTHALMIC | Status: DC
  Administered 2021-08-15: 2 [drp] via OPHTHALMIC
  Filled 2021-08-15: qty 2.5

## 2021-08-15 MED ORDER — MIDAZOLAM HCL 2 MG/2ML IJ SOLN
INTRAMUSCULAR | Status: AC
  Filled 2021-08-15: qty 2

## 2021-08-15 MED ORDER — SODIUM CHLORIDE 0.9 % IV SOLN: INTRAVENOUS | Status: AC

## 2021-08-15 MED ORDER — TRAZODONE HCL 50 MG PO TABS
50.0000 mg | ORAL_TABLET | Freq: Every evening | ORAL | Status: DC | PRN
  Administered 2021-08-16 – 2021-08-19 (×3): 50 mg via ORAL
  Filled 2021-08-15 (×3): qty 1

## 2021-08-15 MED ORDER — HEPARIN SODIUM (PORCINE) 1000 UNIT/ML IJ SOLN
INTRAMUSCULAR | Status: DC | PRN
  Administered 2021-08-15: 3000 [IU] via INTRAVENOUS
  Administered 2021-08-15: 10000 [IU] via INTRAVENOUS
  Administered 2021-08-15: 2000 [IU] via INTRAVENOUS

## 2021-08-15 MED ORDER — ONDANSETRON HCL 4 MG/2ML IJ SOLN: 4.0000 mg | Freq: Four times a day (QID) | INTRAMUSCULAR | Status: DC | PRN

## 2021-08-15 MED ORDER — VITAMIN E 45 MG (100 UNIT) PO CAPS
400.0000 [IU] | ORAL_CAPSULE | Freq: Every day | ORAL | Status: DC
  Administered 2021-08-15 – 2021-08-20 (×6): 400 [IU] via ORAL
  Filled 2021-08-15 (×6): qty 4

## 2021-08-15 MED ORDER — SODIUM CHLORIDE 0.9 % IV SOLN
INTRAVENOUS | Status: AC | PRN
  Administered 2021-08-15: 10 mL/h via INTRAVENOUS

## 2021-08-15 MED ORDER — HEPARIN (PORCINE) IN NACL 1000-0.9 UT/500ML-% IV SOLN
INTRAVENOUS | Status: AC
  Filled 2021-08-15: qty 1000

## 2021-08-15 MED ORDER — FENTANYL CITRATE (PF) 100 MCG/2ML IJ SOLN
INTRAMUSCULAR | Status: DC | PRN
  Administered 2021-08-15 (×2): 25 ug via INTRAVENOUS

## 2021-08-15 MED ORDER — SODIUM CHLORIDE 0.9 % IV SOLN
250.0000 mL | INTRAVENOUS | Status: DC | PRN
Start: 2021-08-15 — End: 2021-09-20

## 2021-08-15 MED ORDER — MIDAZOLAM HCL 2 MG/2ML IJ SOLN
INTRAMUSCULAR | Status: DC | PRN
  Administered 2021-08-15 (×2): 1 mg via INTRAVENOUS

## 2021-08-15 MED ORDER — SODIUM CHLORIDE 0.9 % IV SOLN: 250.0000 mL | INTRAVENOUS | Status: DC | PRN

## 2021-08-15 MED ORDER — PANTOPRAZOLE SODIUM 40 MG PO TBEC: 40.0000 mg | DELAYED_RELEASE_TABLET | Freq: Every day | ORAL | Status: DC

## 2021-08-15 MED ORDER — HEPARIN (PORCINE) IN NACL 1000-0.9 UT/500ML-% IV SOLN
INTRAVENOUS | Status: DC | PRN
  Administered 2021-08-15 (×2): 500 mL

## 2021-08-15 MED ORDER — ALBUTEROL SULFATE HFA 108 (90 BASE) MCG/ACT IN AERS: 2.0000 | INHALATION_SPRAY | Freq: Four times a day (QID) | RESPIRATORY_TRACT | Status: DC | PRN

## 2021-08-15 MED ORDER — VIPERSLIDE LUBRICANT OPTIME
TOPICAL | Status: DC | PRN
  Administered 2021-08-15: 20 mL via SURGICAL_CAVITY

## 2021-08-15 MED ORDER — INSULIN ASPART PROT & ASPART (70-30 MIX) 100 UNIT/ML ~~LOC~~ SUSP
15.0000 [IU] | Freq: Two times a day (BID) | SUBCUTANEOUS | Status: DC
  Administered 2021-08-15 – 2021-08-20 (×10): 15 [IU] via SUBCUTANEOUS
  Filled 2021-08-15: qty 10

## 2021-08-15 MED ORDER — POTASSIUM CHLORIDE CRYS ER 20 MEQ PO TBCR: 40.0000 meq | EXTENDED_RELEASE_TABLET | Freq: Once | ORAL | Status: DC

## 2021-08-15 MED ORDER — ACETAMINOPHEN 325 MG PO TABS
650.0000 mg | ORAL_TABLET | ORAL | Status: DC | PRN
Start: 2021-08-15 — End: 2021-09-20

## 2021-08-15 MED ORDER — ATORVASTATIN CALCIUM 40 MG PO TABS
40.0000 mg | ORAL_TABLET | Freq: Every day | ORAL | Status: DC
  Administered 2021-08-15 – 2021-08-20 (×6): 40 mg via ORAL
  Filled 2021-08-15 (×6): qty 1

## 2021-08-15 MED ORDER — LIDOCAINE HCL (PF) 1 % IJ SOLN
INTRAMUSCULAR | Status: DC | PRN
  Administered 2021-08-15: 20 mL

## 2021-08-15 MED ORDER — LIDOCAINE HCL (PF) 1 % IJ SOLN
INTRAMUSCULAR | Status: AC
  Filled 2021-08-15: qty 30

## 2021-08-15 MED ORDER — ASCORBIC ACID 500 MG PO TABS
500.0000 mg | ORAL_TABLET | Freq: Every day | ORAL | Status: DC
  Administered 2021-08-15 – 2021-08-20 (×6): 500 mg via ORAL
  Filled 2021-08-15 (×6): qty 1

## 2021-08-15 MED ORDER — NITROGLYCERIN 1 MG/10 ML FOR IR/CATH LAB
INTRA_ARTERIAL | Status: AC
  Filled 2021-08-15: qty 10

## 2021-08-15 MED ORDER — ONDANSETRON HCL 4 MG/2ML IJ SOLN
4.0000 mg | Freq: Four times a day (QID) | INTRAMUSCULAR | Status: DC | PRN
Start: 2021-08-15 — End: 2021-08-15

## 2021-08-15 MED ORDER — HYDROCODONE-ACETAMINOPHEN 5-325 MG PO TABS
1.0000 | ORAL_TABLET | Freq: Four times a day (QID) | ORAL | Status: DC | PRN
  Administered 2021-08-15 – 2021-08-17 (×7): 1 via ORAL
  Filled 2021-08-15 (×9): qty 1

## 2021-08-15 MED ORDER — CHOLESTYRAMINE 4 G PO PACK: 4.0000 g | PACK | Freq: Three times a day (TID) | ORAL | Status: DC

## 2021-08-15 MED ORDER — SODIUM CHLORIDE 0.9% FLUSH
3.0000 mL | Freq: Two times a day (BID) | INTRAVENOUS | Status: DC
  Administered 2021-08-15 – 2021-08-20 (×10): 3 mL via INTRAVENOUS

## 2021-08-15 MED ORDER — FENTANYL CITRATE (PF) 100 MCG/2ML IJ SOLN
INTRAMUSCULAR | Status: AC
  Filled 2021-08-15: qty 2

## 2021-08-15 MED ORDER — CHOLESTYRAMINE 4 G PO PACK
4.0000 g | PACK | Freq: Three times a day (TID) | ORAL | Status: DC
  Filled 2021-08-15 (×109): qty 1

## 2021-08-15 MED ORDER — SODIUM CHLORIDE 0.9% FLUSH
3.0000 mL | INTRAVENOUS | Status: DC | PRN
Start: 2021-08-15 — End: 2021-09-20

## 2021-08-15 SURGICAL SUPPLY — 25 items
BALLN SAPPHIRE 2.5X15 (BALLOONS) ×2
BALLN SAPPHIRE ~~LOC~~ 3.0X15 (BALLOONS) ×1 IMPLANT
BALLOON SAPPHIRE 2.5X15 (BALLOONS) IMPLANT
CATH INFINITI 5FR ANG PIGTAIL (CATHETERS) ×1 IMPLANT
CATH OPTICROSS HD (CATHETERS) ×1 IMPLANT
CATH VISTA GUIDE 6FR XB3.5 (CATHETERS) ×1 IMPLANT
CLOSURE PERCLOSE PROSTYLE (VASCULAR PRODUCTS) ×2 IMPLANT
CROWN DIAMONDBACK CLASSIC 1.25 (BURR) ×1 IMPLANT
ELECT DEFIB PAD ADLT CADENCE (PAD) ×1 IMPLANT
KIT ENCORE 26 ADVANTAGE (KITS) ×1 IMPLANT
KIT HEART LEFT (KITS) ×2 IMPLANT
KIT MICROPUNCTURE NIT STIFF (SHEATH) ×1 IMPLANT
LUBRICANT VIPERSLIDE CORONARY (MISCELLANEOUS) ×1 IMPLANT
MAT PREVALON FULL STRYKER (MISCELLANEOUS) ×1 IMPLANT
PACK CARDIAC CATHETERIZATION (CUSTOM PROCEDURE TRAY) ×2 IMPLANT
SET IMPELLA CP PUMP (CATHETERS) ×1 IMPLANT
SHEATH PINNACLE 6F 10CM (SHEATH) ×2 IMPLANT
SHEATH PROBE COVER 6X72 (BAG) ×1 IMPLANT
SLED PULL BACK IVUS (MISCELLANEOUS) ×1 IMPLANT
STENT ONYX FRONTIER 2.75X26 (Permanent Stent) ×1 IMPLANT
TRANSDUCER W/STOPCOCK (MISCELLANEOUS) ×2 IMPLANT
TUBING CIL FLEX 10 FLL-RA (TUBING) ×2 IMPLANT
WIRE EMERALD 3MM-J .035X150CM (WIRE) ×1 IMPLANT
WIRE RUNTHROUGH .014X180CM (WIRE) ×1 IMPLANT
WIRE VIPERWIRE COR FLEX .012 (WIRE) ×1 IMPLANT

## 2021-08-15 NOTE — Interval H&P Note (Signed)
Cath Lab Visit (complete for each Cath Lab visit)  Clinical Evaluation Leading to the Procedure:   ACS: Yes.   Unstable angina  Non-ACS:  n/a   History and Physical Interval Note:  08/15/2021 10:51 AM  [name]  has presented today for surgery, with the diagnosis of cad.  The various methods of treatment have been discussed with the patient and family. After consideration of risks, benefits and other options for treatment, the patient has consented to  Procedure(s): CORONARY ATHERECTOMY (N/A) as a surgical intervention.  The patient's history has been reviewed, patient examined, no change in status, stable for surgery.  I have reviewed the patient's chart and labs.  Questions were answered to the patient's satisfaction.     Kathlyn Sacramento

## 2021-08-16 ENCOUNTER — Inpatient Hospital Stay (HOSPITAL_COMMUNITY): Payer: Medicare Other

## 2021-08-16 DIAGNOSIS — I5023 Acute on chronic systolic (congestive) heart failure: Secondary | ICD-10-CM

## 2021-08-16 LAB — MAGNESIUM: Magnesium: 1.8 mg/dL (ref 1.7–2.4)

## 2021-08-16 LAB — BASIC METABOLIC PANEL
Anion gap: 7 (ref 5–15)
BUN: 28 mg/dL — ABNORMAL HIGH (ref 8–23)
CO2: 27 mmol/L (ref 22–32)
Calcium: 8.4 mg/dL — ABNORMAL LOW (ref 8.9–10.3)
Chloride: 97 mmol/L — ABNORMAL LOW (ref 98–111)
Creatinine, Ser: 1.25 mg/dL — ABNORMAL HIGH (ref 0.61–1.24)
GFR, Estimated: 56 mL/min — ABNORMAL LOW (ref >60)
Glucose, Bld: 149 mg/dL — ABNORMAL HIGH (ref 70–99)
Potassium: 4 mmol/L (ref 3.5–5.1)
Sodium: 131 mmol/L — ABNORMAL LOW (ref 135–145)

## 2021-08-16 LAB — CBC
HCT: 40.6 % (ref 39.0–52.0)
Hemoglobin: 12.1 g/dL — ABNORMAL LOW (ref 13.0–17.0)
MCH: 24.3 pg — ABNORMAL LOW (ref 26.0–34.0)
MCHC: 29.8 g/dL — ABNORMAL LOW (ref 30.0–36.0)
MCV: 81.7 fL (ref 80.0–100.0)
Platelets: 220 10*3/uL (ref 150–400)
RBC: 4.97 MIL/uL (ref 4.22–5.81)
RDW: 19.2 % — ABNORMAL HIGH (ref 11.5–15.5)
WBC: 5.7 10*3/uL (ref 4.0–10.5)
nRBC: 0 % (ref 0.0–0.2)

## 2021-08-16 LAB — GLUCOSE, CAPILLARY
Glucose-Capillary: 171 mg/dL — ABNORMAL HIGH (ref 70–99)
Glucose-Capillary: 168 mg/dL — ABNORMAL HIGH (ref 70–99)
Glucose-Capillary: 190 mg/dL — ABNORMAL HIGH (ref 70–99)
Glucose-Capillary: 163 mg/dL — ABNORMAL HIGH (ref 70–99)

## 2021-08-16 MED ORDER — LATANOPROST 0.005 % OP SOLN
2.0000 [drp] | Freq: Every day | OPHTHALMIC | Status: DC
  Administered 2021-08-16 – 2021-08-19 (×4): 2 [drp] via OPHTHALMIC

## 2021-08-16 MED ORDER — METOPROLOL SUCCINATE ER 50 MG PO TB24
50.0000 mg | ORAL_TABLET | Freq: Every day | ORAL | Status: DC
  Administered 2021-08-16 – 2021-08-20 (×5): 50 mg via ORAL
  Filled 2021-08-16 (×4): qty 1

## 2021-08-16 MED ORDER — TIMOLOL MALEATE 0.5 % OP SOLN
2.0000 [drp] | Freq: Every day | OPHTHALMIC | Status: DC
  Administered 2021-08-17 – 2021-08-20 (×4): 2 [drp] via OPHTHALMIC

## 2021-08-16 MED ORDER — SPIRONOLACTONE 12.5 MG HALF TABLET: 12.5000 mg | ORAL_TABLET | Freq: Every day | ORAL | Status: DC

## 2021-08-16 MED ORDER — LIDOCAINE 5 % EX PTCH
1.0000 | MEDICATED_PATCH | CUTANEOUS | Status: DC
  Administered 2021-08-16 – 2021-08-19 (×4): 1 via TRANSDERMAL
  Filled 2021-08-16 (×4): qty 1

## 2021-08-16 MED ORDER — FUROSEMIDE 10 MG/ML IJ SOLN
40.0000 mg | Freq: Once | INTRAMUSCULAR | Status: AC
  Administered 2021-08-16: 40 mg via INTRAVENOUS
  Filled 2021-08-16: qty 4

## 2021-08-16 MED ORDER — NITROGLYCERIN 0.4 MG SL SUBL
SUBLINGUAL_TABLET | SUBLINGUAL | Status: AC
  Filled 2021-08-16: qty 1

## 2021-08-16 NOTE — Progress Notes (Addendum)
Progress Note  Patient Name: Henry Lindsey Date of Encounter: 08/16/2021  [facility] Cardiologist: Ida Rogue, MD   Subjective   Sitting up in bed. No chest pain. Initially on O2 @3L , sats ok while sitting but dipped in to the 80s with sitting forward.   Inpatient Medications    Scheduled Meds:  ascorbic acid  500 mg Oral Daily   aspirin EC  81 mg Oral Daily   atorvastatin  40 mg Oral Daily   cholecalciferol  1,000 Units Oral Daily   clopidogrel  75 mg Oral Daily   furosemide  40 mg Intravenous Once   gabapentin  400 mg Oral BID   insulin aspart protamine- aspart  15 Units Subcutaneous BID WC   latanoprost  2 drop Both Eyes QHS   metoprolol succinate  100 mg Oral Daily   multivitamin with minerals  1 tablet Oral Daily   nitroGLYCERIN       sodium chloride flush  3 mL Intravenous Q12H   spironolactone  12.5 mg Oral Daily   timolol  2 drop Both Eyes Daily   vitamin E  400 Units Oral Daily   Continuous Infusions:  sodium chloride     PRN Meds: sodium chloride, HYDROcodone-acetaminophen, sodium chloride flush, traZODone   Vital Signs    Vitals:   08/15/21 1935 08/15/21 2334 08/16/21 0334 08/16/21 0700  BP: 120/82 108/74 113/75 114/72  Pulse: 78 81 69 71  Resp: 16 16 16 16   Temp: 97.9 F (36.6 C) 97.7 F (36.5 C) (!) 97.3 F (36.3 C) (!) 97.3 F (36.3 C)  TempSrc: Oral Oral Oral Oral  SpO2: 100% 100% 100% 97%    Intake/Output Summary (Last 24 hours) at 08/16/2021 0832 Last data filed at 08/15/2021 2130 Gross per 24 hour  Intake --  Output 650 ml  Net -650 ml   Last 3 Weights 08/15/2021 08/14/2021 08/13/2021  Weight (lbs) 202 lb 13.2 oz 203 lb 0.7 oz 204 lb 9.6 oz  Weight (kg) 92 kg 92.1 kg 92.806 kg      Telemetry    SR with Bigeminy - Personally Reviewed  ECG    SR with bigeminy, 70bpm - Personally Reviewed  Physical Exam   GEN: No acute distress.   Neck: No JVD Cardiac: RRR, no murmurs, rubs, or gallops.  Respiratory: Diminished  in bases bilaterally GI: Soft, nontender, non-distended  MS: No edema; No deformity. Right femoral cath site stable.  Neuro:  Nonfocal  Psych: Normal affect   Labs    High Sensitivity Troponin:   Recent Labs  Lab 08/09/21 1231 08/09/21 1409 08/09/21 1658 08/09/21 2151 08/10/21 0643  TROPONINIHS 533* 580* 816* 1,183* 1,076*     Chemistry Recent Labs  Lab 08/09/21 1231 08/10/21 0539 08/11/21 1111 08/12/21 0617 08/14/21 0751 08/15/21 0458 08/15/21 1338 08/16/21 0219  NA 137 137   < > 136   < > 133* 134* 131*  K 4.6 4.1   < > 3.7   < > 3.8 4.0 4.0  CL 99 98   < > 97*   < > 97* 98 97*  CO2 26 26   < > 28   < > 27 26 27   GLUCOSE 79 120*   < > 131*   < > 198* 152* 149*  BUN 26* 28*   < > 24*   < > 33* 29* 28*  CREATININE 1.31* 1.40*   < > 1.26*   < > 1.20 1.29* 1.25*  CALCIUM 8.7*  8.6*   < > 8.3*   < > 8.3* 8.4* 8.4*  MG 1.8 1.9  --   --   --   --   --   --   PROT 7.3  --   --  7.2  --   --   --   --   ALBUMIN 2.8*  --   --  2.5*  --   --   --   --   AST 118*  --   --  70*  --   --   --   --   ALT 59*  --   --  52*  --   --   --   --   ALKPHOS 188*  --   --  145*  --   --   --   --   BILITOT 3.3*  --   --  1.7*  --   --   --   --   GFRNONAA 53* 49*   < > 56*   < > 59* 54* 56*  ANIONGAP 12 13   < > 11   < > 9 10 7    < > = values in this interval not displayed.    Lipids  Recent Labs  Lab 08/10/21 0643  CHOL 53  TRIG 54  HDL 13*  LDLCALC 29  CHOLHDL 4.1    Hematology Recent Labs  Lab 08/15/21 0458 08/15/21 1338 08/16/21 0219  WBC 5.5 5.2 5.7  RBC 4.93 4.84 4.97  HGB 12.5* 11.7* 12.1*  HCT 39.8 40.1 40.6  MCV 80.7 82.9 81.7  MCH 25.4* 24.2* 24.3*  MCHC 31.4 29.2* 29.8*  RDW 19.0* 19.1* 19.2*  PLT 213 204 220   Thyroid No results for input(s): TSH, FREET4 in the last 168 hours.  BNP Recent Labs  Lab 08/09/21 1231  BNP >4,500.0*    DDimer No results for input(s): DDIMER in the last 168 hours.   Radiology    CARDIAC CATHETERIZATION  Result  Date: 08/15/2021   Mid LM to Dist LM lesion is 30% stenosed.   Ost LAD lesion is 85% stenosed.   Prox LAD lesion is 50% stenosed.   Dist LAD lesion is 35% stenosed.   Prox Cx lesion is 50% stenosed.   Prox RCA to Dist RCA lesion is 100% stenosed.   1st Diag lesion is 80% stenosed.   1st Mrg lesion is 70% stenosed.   A drug-eluting stent was successfully placed using a STENT ONYX FRONTIER 2.75X26.   Post intervention, there is a 0% residual stenosis.   Post intervention, there is a 0% residual stenosis.   Post intervention, there is a 0% residual stenosis. Successful Impella supported high risk PCI of the ostial LAD with atherectomy and drug-eluting stent placement.  The stent extended 1 to 2 mm into the left main coronary artery to ensure coverage of the ostium.  There was normal flow into the  left circumflex. Moderately elevated left ventricular end-diastolic pressure at 24 mmHg at the beginning of the case. Recommendations: Dual antiplatelet therapy for at least 12 months and preferably longer. Aggressive treatment of risk factors. Optimize heart failure therapy.  Monitor renal function closely.  135 mL of contrast was used.  CARDIAC CATHETERIZATION  Result Date: 08/14/2021 Conclusions: Severe multivessel coronary artery disease, including 80-90% ostial LAD stenosis and chronic total occlusion of the proximal RCA, a seen on prior catheterization in 11/2020. Moderately elevated left heart, right heart, and pulmonary artery pressures. Moderately-severely reduced  cardiac output/index. Ectatic abdominal aorta common/external iliac arteries, and common femoral arteries without significant disease.  Focal 70% right internal iliac artery stenosis is noted. 50% ostial stenosis of right renal artery. Recommendations: Transfer to [facility] for consideration of high risk PCI to LMCA/ostial LAD.  Given heavy calcification and reduced LVEF, atherectomy and Impella support will need to be considered.  I will load the  patient with clopidogrel 600 mg today, followed by 75 mg daily thereafter. Maintain net even to slightly negative fluid balance following contrast exposure today and chronic kidney disease. Escalate goal-directed medical therapy of acute on chronic HFrEF, as tolerated. Aggressive secondary prevention of coronary artery disease. Nelva Bush, MD [facility]  PERIPHERAL VASCULAR CATHETERIZATION  Result Date: 08/14/2021 Conclusions: Severe multivessel coronary artery disease, including 80-90% ostial LAD stenosis and chronic total occlusion of the proximal RCA, a seen on prior catheterization in 11/2020. Moderately elevated left heart, right heart, and pulmonary artery pressures. Moderately-severely reduced cardiac output/index. Ectatic abdominal aorta common/external iliac arteries, and common femoral arteries without significant disease.  Focal 70% right internal iliac artery stenosis is noted. 50% ostial stenosis of right renal artery. Recommendations: Transfer to [facility] for consideration of high risk PCI to LMCA/ostial LAD.  Given heavy calcification and reduced LVEF, atherectomy and Impella support will need to be considered.  I will load the patient with clopidogrel 600 mg today, followed by 75 mg daily thereafter. Maintain net even to slightly negative fluid balance following contrast exposure today and chronic kidney disease. Escalate goal-directed medical therapy of acute on chronic HFrEF, as tolerated. Aggressive secondary prevention of coronary artery disease. Nelva Bush, MD [facility]   Cardiac Studies   Echo: 08/10/21  IMPRESSIONS     1. Left ventricular ejection fraction, by estimation, is 25 to 30%. The  left ventricle has severely decreased function. The left ventricle  demonstrates global hypokinesis with severe hypokinesis of the anterior,  septal, and apical region. Inferior wall  best preserved. Left ventricular diastolic parameters are consistent with  Grade II  diastolic dysfunction (pseudonormalization). The average left  ventricular global longitudinal strain is -6.3 %. The global longitudinal  strain is abnormal.   2. Right ventricular systolic function is moderately reduced. The right  ventricular size is moderately enlarged. There is mildly elevated  pulmonary artery systolic pressure. The estimated right ventricular  systolic pressure is 93.8 mmHg.   3. Left atrial size was moderately dilated.   4. The mitral valve is normal in structure. Moderate to severe mitral  valve regurgitation.   5. Tricuspid valve regurgitation is mild to moderate.   6. Aortic dilatation noted. There is mild dilatation of the aortic root,  measuring 39 mm.   7. The inferior vena cava is dilated in size with <50% respiratory  variability, suggesting right atrial pressure of 15 mmHg.   FINDINGS   Left Ventricle: Left ventricular ejection fraction, by estimation, is 25  to 30%. The left ventricle has severely decreased function. The left  ventricle demonstrates global hypokinesis. The average left ventricular  global longitudinal strain is -6.3 %.  The global longitudinal strain is abnormal. The left ventricular internal  cavity size was normal in size. There is no left ventricular hypertrophy.  Left ventricular diastolic parameters are consistent with Grade II  diastolic dysfunction  (pseudonormalization).   Right Ventricle: The right ventricular size is moderately enlarged. No  increase in right ventricular wall thickness. Right ventricular systolic  function is moderately reduced. There is mildly elevated pulmonary artery  systolic pressure. The tricuspid  regurgitant velocity is 2.58 m/s, and with an assumed right atrial  pressure of 15 mmHg, the estimated right ventricular systolic pressure is  42.3 mmHg.   Left Atrium: Left atrial size was moderately dilated.   Right Atrium: Right atrial size was normal in size.   Pericardium: There is no evidence of  pericardial effusion.   Mitral Valve: The mitral valve is normal in structure. Moderate to severe  mitral valve regurgitation, with eccentric posteriorly directed jet. No  evidence of mitral valve stenosis.   Tricuspid Valve: The tricuspid valve is normal in structure. Tricuspid  valve regurgitation is mild to moderate. No evidence of tricuspid  stenosis.   Aortic Valve: The aortic valve was not well visualized. Aortic valve  regurgitation is not visualized. Mild aortic valve sclerosis is present,  with no evidence of aortic valve stenosis. Aortic valve mean gradient  measures 4.0 mmHg. Aortic valve peak  gradient measures 6.9 mmHg. Aortic valve area, by VTI measures 2.57 cm.   Pulmonic Valve: The pulmonic valve was normal in structure. Pulmonic valve  regurgitation is mild to moderate. No evidence of pulmonic stenosis.   Aorta: Aortic dilatation noted. There is mild dilatation of the aortic  root, measuring 39 mm.   Venous: The pulmonary veins were not well visualized. The inferior vena  cava is dilated in size with less than 50% respiratory variability,  suggesting right atrial pressure of 15 mmHg.   IAS/Shunts: No atrial level shunt detected by color flow Doppler.   Cath: 08/14/21  Conclusions: Severe multivessel coronary artery disease, including 80-90% ostial LAD stenosis and chronic total occlusion of the proximal RCA, a seen on prior catheterization in 11/2020. Moderately elevated left heart, right heart, and pulmonary artery pressures. Moderately-severely reduced cardiac output/index. Ectatic abdominal aorta common/external iliac arteries, and common femoral arteries without significant disease.  Focal 70% right internal iliac artery stenosis is noted. 50% ostial stenosis of right renal artery.   Recommendations: Transfer to [facility] for consideration of high risk PCI to LMCA/ostial LAD.  Given heavy calcification and reduced LVEF, atherectomy and Impella support will  need to be considered.  I will load the patient with clopidogrel 600 mg today, followed by 75 mg daily thereafter. Maintain net even to slightly negative fluid balance following contrast exposure today and chronic kidney disease. Escalate goal-directed medical therapy of acute on chronic HFrEF, as tolerated. Aggressive secondary prevention of coronary artery disease.   Nelva Bush, MD  Diagnostic Dominance: Right  Cath: 08/15/21    Mid LM to Dist LM lesion is 30% stenosed.   Ost LAD lesion is 85% stenosed.   Prox LAD lesion is 50% stenosed.   Dist LAD lesion is 35% stenosed.   Prox Cx lesion is 50% stenosed.   Prox RCA to Dist RCA lesion is 100% stenosed.   1st Diag lesion is 80% stenosed.   1st Mrg lesion is 70% stenosed.   A drug-eluting stent was successfully placed using a STENT ONYX FRONTIER 2.75X26.   Post intervention, there is a 0% residual stenosis.   Post intervention, there is a 0% residual stenosis.   Post intervention, there is a 0% residual stenosis.   Successful Impella supported high risk PCI of the ostial LAD with atherectomy and drug-eluting stent placement.  The stent extended 1 to 2 mm into the left main coronary artery to ensure coverage of the ostium.  There was normal flow into the  left circumflex. Moderately elevated left ventricular end-diastolic  pressure at 24 mmHg at the beginning of the case.   Recommendations: Dual antiplatelet therapy for at least 12 months and preferably longer. Aggressive treatment of risk factors. Optimize heart failure therapy.  Monitor renal function closely.  135 mL of contrast was used.  Diagnostic Dominance: Right Intervention    Patient Profile     87 y.o. male with a history of CAD, ICM, HFrEF, HTN, HL, DMII, GERD, CVA, PAD, and carotid arterial dzs, who presented October 19 due to progressive dyspnea, increasing abdominal girth, lower extremity edema.  Elevated troponin to 1183.  EF 25 to 30% with global  hypokinesis and severe anterior, septal, and apical hypokinesis on echo this admission.  Assessment & Plan    NSTEMI/Known Multivessel CAD: H/o of multivessel CAD previously seen by CT surgery and deemed not a good surgical candidate. Presented to [facility] 10/19 with CHF. BNP and troponin elevated. EKG showed septal infarct with inferior T wave changes. Echo showed worsening EF 20-25% (prior was 35-40% 11/2020). He eventually underwent repeat heart cath showing unchanged anatomy from heart cath 11/2020 (details of cath above).  He was transferred to [facility] for high risk PCI.  Underwent successful Impella supported high risk PCI of ostial LAD with atherectomy and DES x1.  Stent did extend 1 to 2 mm into the left main coronary artery to ensure coverage of the ostium.  No complications noted.  Recommendations for DAPT with aspirin/Plavix for at least 1 year preferably longer.  Cardiac rehab to see. -- Continue ASA, plavix, statin, BB  Acute on chronic HFrEF/ICM: EF 35-40% by echo 11/2020. Echo this admission showed decline in EF 20-25% with global HK and more severe HK of the anterior, septal and apical regions. -- GDMT is limited in the setting of soft blood pressures.  He is tolerating Toprol XL 100 mg daily, will plan to add spironolactone 12.5 mg daily tomorrow pending stable renal function -- Initially on 3 L O2 with stable sats noted on room air with sitting, but dropped sats into 80% with simply leaning forward for lung exam.  Will hold oral lasix and give IV Lasix 40 mg x 1 -- Check chest x-ray   HLD:  LDL 29 07/2021 -- continue atorvastatin   DM: A1C 6.7 -- SSI during admission -- allergy to Jardiance   PAD: continue ASA and statin -- lower anatomy reviewed on recent cath   CKD stage 3: monitor with diuresis and post-cath -- daily BMET  Bigeminy: K+ 4.0 -- check mag -- continue BB therapy  Deconditioned: currently lives at home with girlfriend. Will order PT evaluation to help with  dispo   For questions or updates, please contact [place] Please consult [email or web address] for contact info under        Signed, Reino Bellis, NP  08/16/2021, 8:32 AM    Patient seen, examined. Available data reviewed. Agree with findings, assessment, and plan as outlined by Reino Bellis, NP.  The patient is independently interviewed and examined.  His daughter is at the bedside.  He is an elderly male in no acute distress.  JVP is normal, lungs are diminished in the bases but otherwise clear, heart is regular rate and rhythm with a 3/6 holosystolic murmur at the apex with somewhat distant heart sounds, abdomen is soft and nontender, extremities have no edema, the right groin site has ecchymoses with no firm hematoma.  I agree with the plan as outlined above.  Will reduce his beta-blocker by 50% in order to allow  enough blood pressure for diuresis.  Check a portable chest x-ray.  Depending on his clinical course as an outpatient, it might be reasonable to pursue a transesophageal echo to better assess the functional anatomy of his mitral valve as he appears to have at least moderately severe mitral regurgitation.  Repeat metabolic panel tomorrow morning.  Sherren Mocha, M.D. 08/16/2021 9:18 AM

## 2021-08-16 NOTE — Progress Notes (Signed)
Patient throwing multiple PVCs. Cardiology team notified

## 2021-08-16 NOTE — Progress Notes (Signed)
CARDIAC REHAB PHASE I   Went to go see pt, PT heading there to work with pt. Will f/u later as time allows.  Rufina Falco, RN BSN 08/16/2021 1:45 PM

## 2021-08-16 NOTE — Progress Notes (Signed)
CARDIAC REHAB PHASE I   Began MI/stent education with pt. Pt educated on importance of ASA and Plavix. Pt given MI book and stent card along with heart healthy diet. Reviewed site care and restrictions. Will f/u tomorrow to encourage ambulation and complete education.  [id number] Rufina Falco, RN BSN 08/16/2021 2:51 PM

## 2021-08-16 NOTE — Progress Notes (Signed)
PT Cancellation Note  Patient Details Name: EUAL LINDSTROM MRN: [id number] DOB: 1933-07-02   Cancelled Treatment:    Reason Eval/Treat Not Completed: Pain limiting ability to participate Pt reporting increased pain and wants to hold on PT for today and try in the morning. Will follow up as schedule allows.   Reuel Derby, PT, DPT  Acute Rehabilitation Services  Pager: [phone] Office: [phone]    Rudean Hitt 08/16/2021, 2:03 PM

## 2021-08-17 DIAGNOSIS — I5023 Acute on chronic systolic (congestive) heart failure: Secondary | ICD-10-CM | POA: Diagnosis not present

## 2021-08-17 LAB — COMPREHENSIVE METABOLIC PANEL
ALT: 37 U/L (ref 0–44)
AST: 45 U/L — ABNORMAL HIGH (ref 15–41)
Albumin: 2.5 g/dL — ABNORMAL LOW (ref 3.5–5.0)
Alkaline Phosphatase: 135 U/L — ABNORMAL HIGH (ref 38–126)
Anion gap: 7 (ref 5–15)
BUN: 33 mg/dL — ABNORMAL HIGH (ref 8–23)
CO2: 28 mmol/L (ref 22–32)
Calcium: 8.4 mg/dL — ABNORMAL LOW (ref 8.9–10.3)
Chloride: 99 mmol/L (ref 98–111)
Creatinine, Ser: 1.43 mg/dL — ABNORMAL HIGH (ref 0.61–1.24)
GFR, Estimated: 48 mL/min — ABNORMAL LOW (ref >60)
Glucose, Bld: 113 mg/dL — ABNORMAL HIGH (ref 70–99)
Potassium: 4.3 mmol/L (ref 3.5–5.1)
Sodium: 134 mmol/L — ABNORMAL LOW (ref 135–145)
Total Bilirubin: 1.5 mg/dL — ABNORMAL HIGH (ref 0.3–1.2)
Total Protein: 6.8 g/dL (ref 6.5–8.1)

## 2021-08-17 LAB — GLUCOSE, CAPILLARY
Glucose-Capillary: 116 mg/dL — ABNORMAL HIGH (ref 70–99)
Glucose-Capillary: 177 mg/dL — ABNORMAL HIGH (ref 70–99)
Glucose-Capillary: 204 mg/dL — ABNORMAL HIGH (ref 70–99)
Glucose-Capillary: 230 mg/dL — ABNORMAL HIGH (ref 70–99)

## 2021-08-17 MED ORDER — SPIRONOLACTONE 12.5 MG HALF TABLET
12.5000 mg | ORAL_TABLET | Freq: Every day | ORAL | Status: DC
  Administered 2021-08-17 – 2021-08-20 (×4): 12.5 mg via ORAL
  Filled 2021-08-17 (×4): qty 1

## 2021-08-17 MED ORDER — FUROSEMIDE 20 MG PO TABS
20.0000 mg | ORAL_TABLET | Freq: Every day | ORAL | Status: DC
Start: 2021-08-17 — End: 2021-08-20
  Administered 2021-08-17 – 2021-08-20 (×4): 20 mg via ORAL
  Filled 2021-08-17 (×4): qty 1

## 2021-08-17 MED ORDER — POLYETHYLENE GLYCOL 3350 17 G PO PACK
17.0000 g | PACK | Freq: Every day | ORAL | Status: DC
  Administered 2021-08-17 – 2021-08-18 (×2): 17 g via ORAL
  Filled 2021-08-17 (×4): qty 1

## 2021-08-17 NOTE — Progress Notes (Signed)
CARDIAC REHAB PHASE I   Offered to get pt OOB to chair. Pt and family decline at this time as he is eating breakfast. Encouraged sitting in recliner for increased ease of eating and digestion, pt continues to decline. Will f/u to continue to encourage OOB.  Rufina Falco, RN BSN 08/17/2021 9:11 AM

## 2021-08-17 NOTE — Progress Notes (Addendum)
Progress Note  Patient Name: Henry Lindsey Date of Encounter: 08/17/2021  [facility] Cardiologist: Ida Rogue, MD   Subjective   Sitting up in bed. Actually felt like eating his entire breakfast. Daughter at bedside.   Inpatient Medications    Scheduled Meds:  ascorbic acid  500 mg Oral Daily   aspirin EC  81 mg Oral Daily   atorvastatin  40 mg Oral Daily   cholecalciferol  1,000 Units Oral Daily   clopidogrel  75 mg Oral Daily   gabapentin  400 mg Oral BID   insulin aspart protamine- aspart  15 Units Subcutaneous BID WC   latanoprost  2 drop Both Eyes Daily   lidocaine  1 patch Transdermal Q24H   metoprolol succinate  50 mg Oral Daily   multivitamin with minerals  1 tablet Oral Daily   sodium chloride flush  3 mL Intravenous Q12H   timolol  2 drop Both Eyes Q1500   vitamin E  400 Units Oral Daily   Continuous Infusions:  sodium chloride     PRN Meds: sodium chloride, HYDROcodone-acetaminophen, sodium chloride flush, traZODone   Vital Signs    Vitals:   08/16/21 1115 08/16/21 1418 08/16/21 1946 08/17/21 0434  BP: 125/72 115/69 113/85 117/68  Pulse: 72 73 71 63  Resp:  17 19 13   Temp:  98 F (36.7 C) (!) 97.4 F (36.3 C) (!) 97.3 F (36.3 C)  TempSrc:  Oral Axillary Axillary  SpO2:  99% 98% 100%    Intake/Output Summary (Last 24 hours) at 08/17/2021 0929 Last data filed at 08/17/2021 0434 Gross per 24 hour  Intake --  Output 1025 ml  Net -1025 ml   Last 3 Weights 08/15/2021 08/14/2021 08/13/2021  Weight (lbs) 202 lb 13.2 oz 203 lb 0.7 oz 204 lb 9.6 oz  Weight (kg) 92 kg 92.1 kg 92.806 kg      Telemetry    SR - Personally Reviewed  ECG    No new tracing  Physical Exam   GEN: Frail older male, No acute distress.   Neck: No JVD Cardiac: RRR, + systolic murmur, no rubs, or gallops.  Respiratory: Diminished in bases, faint crackles  GI: Soft, nontender, non-distended  MS: No edema; No deformity. Neuro:  Nonfocal  Psych: Normal  affect   Labs    High Sensitivity Troponin:   Recent Labs  Lab 08/09/21 1231 08/09/21 1409 08/09/21 1658 08/09/21 2151 08/10/21 0643  TROPONINIHS 533* 580* 816* 1,183* 1,076*     Chemistry Recent Labs  Lab 08/12/21 0617 08/14/21 0751 08/15/21 1338 08/16/21 0219 08/17/21 0311  NA 136   < > 134* 131* 134*  K 3.7   < > 4.0 4.0 4.3  CL 97*   < > 98 97* 99  CO2 28   < > 26 27 28   GLUCOSE 131*   < > 152* 149* 113*  BUN 24*   < > 29* 28* 33*  CREATININE 1.26*   < > 1.29* 1.25* 1.43*  CALCIUM 8.3*   < > 8.4* 8.4* 8.4*  MG  --   --   --  1.8  --   PROT 7.2  --   --   --  6.8  ALBUMIN 2.5*  --   --   --  2.5*  AST 70*  --   --   --  45*  ALT 52*  --   --   --  37  ALKPHOS 145*  --   --   --  135*  BILITOT 1.7*  --   --   --  1.5*  GFRNONAA 56*   < > 54* 56* 48*  ANIONGAP 11   < > 10 7 7    < > = values in this interval not displayed.    Lipids No results for input(s): CHOL, TRIG, HDL, LABVLDL, LDLCALC, CHOLHDL in the last 168 hours.  Hematology Recent Labs  Lab 08/15/21 0458 08/15/21 1338 08/16/21 0219  WBC 5.5 5.2 5.7  RBC 4.93 4.84 4.97  HGB 12.5* 11.7* 12.1*  HCT 39.8 40.1 40.6  MCV 80.7 82.9 81.7  MCH 25.4* 24.2* 24.3*  MCHC 31.4 29.2* 29.8*  RDW 19.0* 19.1* 19.2*  PLT 213 204 220   Thyroid No results for input(s): TSH, FREET4 in the last 168 hours.  BNPNo results for input(s): BNP, PROBNP in the last 168 hours.  DDimer No results for input(s): DDIMER in the last 168 hours.   Radiology    DG Chest 2 View  Result Date: 08/16/2021 CLINICAL DATA:  Difficulty breathing EXAM: CHEST - 2 VIEW COMPARISON:  Previous studies including the examination of 08/09/2021 FINDINGS: Transverse diameter of heart is increased. Central pulmonary vessels are prominent without signs of alveolar pulmonary edema. There is blunting of both posterior costophrenic angles suggesting bilateral pleural effusions, more so on the right side. There is interval worsening of subsegmental  atelectasis in right parahilar region. There is no pneumothorax. IMPRESSION: Cardiomegaly. Central pulmonary vessels are prominent without signs of pulmonary edema. Bilateral pleural effusions, more so on the right side. Linear densities in right parahilar region suggests more prominent suggesting worsening subsegmental atelectasis. Reading location: [place], [place]. Electronically Signed   By: Elmer Picker M.D.   On: 08/16/2021 13:18   CARDIAC CATHETERIZATION  Result Date: 08/15/2021   Mid LM to Dist LM lesion is 30% stenosed.   Ost LAD lesion is 85% stenosed.   Prox LAD lesion is 50% stenosed.   Dist LAD lesion is 35% stenosed.   Prox Cx lesion is 50% stenosed.   Prox RCA to Dist RCA lesion is 100% stenosed.   1st Diag lesion is 80% stenosed.   1st Mrg lesion is 70% stenosed.   A drug-eluting stent was successfully placed using a STENT ONYX FRONTIER 2.75X26.   Post intervention, there is a 0% residual stenosis.   Post intervention, there is a 0% residual stenosis.   Post intervention, there is a 0% residual stenosis. Successful Impella supported high risk PCI of the ostial LAD with atherectomy and drug-eluting stent placement.  The stent extended 1 to 2 mm into the left main coronary artery to ensure coverage of the ostium.  There was normal flow into the  left circumflex. Moderately elevated left ventricular end-diastolic pressure at 24 mmHg at the beginning of the case. Recommendations: Dual antiplatelet therapy for at least 12 months and preferably longer. Aggressive treatment of risk factors. Optimize heart failure therapy.  Monitor renal function closely.  135 mL of contrast was used.   Cardiac Studies   Echo: 08/10/21   IMPRESSIONS     1. Left ventricular ejection fraction, by estimation, is 25 to 30%. The  left ventricle has severely decreased function. The left ventricle  demonstrates global hypokinesis with severe hypokinesis of the anterior,  septal, and apical region.  Inferior wall  best preserved. Left ventricular diastolic parameters are consistent with  Grade II diastolic dysfunction (pseudonormalization). The average left  ventricular global longitudinal strain is -6.3 %. The global longitudinal  strain  is abnormal.   2. Right ventricular systolic function is moderately reduced. The right  ventricular size is moderately enlarged. There is mildly elevated  pulmonary artery systolic pressure. The estimated right ventricular  systolic pressure is 85.6 mmHg.   3. Left atrial size was moderately dilated.   4. The mitral valve is normal in structure. Moderate to severe mitral  valve regurgitation.   5. Tricuspid valve regurgitation is mild to moderate.   6. Aortic dilatation noted. There is mild dilatation of the aortic root,  measuring 39 mm.   7. The inferior vena cava is dilated in size with <50% respiratory  variability, suggesting right atrial pressure of 15 mmHg.   FINDINGS   Left Ventricle: Left ventricular ejection fraction, by estimation, is 25  to 30%. The left ventricle has severely decreased function. The left  ventricle demonstrates global hypokinesis. The average left ventricular  global longitudinal strain is -6.3 %.  The global longitudinal strain is abnormal. The left ventricular internal  cavity size was normal in size. There is no left ventricular hypertrophy.  Left ventricular diastolic parameters are consistent with Grade II  diastolic dysfunction  (pseudonormalization).   Right Ventricle: The right ventricular size is moderately enlarged. No  increase in right ventricular wall thickness. Right ventricular systolic  function is moderately reduced. There is mildly elevated pulmonary artery  systolic pressure. The tricuspid  regurgitant velocity is 2.58 m/s, and with an assumed right atrial  pressure of 15 mmHg, the estimated right ventricular systolic pressure is  31.4 mmHg.   Left Atrium: Left atrial size was moderately  dilated.   Right Atrium: Right atrial size was normal in size.   Pericardium: There is no evidence of pericardial effusion.   Mitral Valve: The mitral valve is normal in structure. Moderate to severe  mitral valve regurgitation, with eccentric posteriorly directed jet. No  evidence of mitral valve stenosis.   Tricuspid Valve: The tricuspid valve is normal in structure. Tricuspid  valve regurgitation is mild to moderate. No evidence of tricuspid  stenosis.   Aortic Valve: The aortic valve was not well visualized. Aortic valve  regurgitation is not visualized. Mild aortic valve sclerosis is present,  with no evidence of aortic valve stenosis. Aortic valve mean gradient  measures 4.0 mmHg. Aortic valve peak  gradient measures 6.9 mmHg. Aortic valve area, by VTI measures 2.57 cm.   Pulmonic Valve: The pulmonic valve was normal in structure. Pulmonic valve  regurgitation is mild to moderate. No evidence of pulmonic stenosis.   Aorta: Aortic dilatation noted. There is mild dilatation of the aortic  root, measuring 39 mm.   Venous: The pulmonary veins were not well visualized. The inferior vena  cava is dilated in size with less than 50% respiratory variability,  suggesting right atrial pressure of 15 mmHg.   IAS/Shunts: No atrial level shunt detected by color flow Doppler.    Cath: 08/14/21   Conclusions: Severe multivessel coronary artery disease, including 80-90% ostial LAD stenosis and chronic total occlusion of the proximal RCA, a seen on prior catheterization in 11/2020. Moderately elevated left heart, right heart, and pulmonary artery pressures. Moderately-severely reduced cardiac output/index. Ectatic abdominal aorta common/external iliac arteries, and common femoral arteries without significant disease.  Focal 70% right internal iliac artery stenosis is noted. 50% ostial stenosis of right renal artery.   Recommendations: Transfer to [facility] for consideration of high  risk PCI to LMCA/ostial LAD.  Given heavy calcification and reduced LVEF, atherectomy and Impella support will need  to be considered.  I will load the patient with clopidogrel 600 mg today, followed by 75 mg daily thereafter. Maintain net even to slightly negative fluid balance following contrast exposure today and chronic kidney disease. Escalate goal-directed medical therapy of acute on chronic HFrEF, as tolerated. Aggressive secondary prevention of coronary artery disease.   Nelva Bush, MD   Diagnostic Dominance: Right Cath: 08/15/21     Mid LM to Dist LM lesion is 30% stenosed.   Ost LAD lesion is 85% stenosed.   Prox LAD lesion is 50% stenosed.   Dist LAD lesion is 35% stenosed.   Prox Cx lesion is 50% stenosed.   Prox RCA to Dist RCA lesion is 100% stenosed.   1st Diag lesion is 80% stenosed.   1st Mrg lesion is 70% stenosed.   A drug-eluting stent was successfully placed using a STENT ONYX FRONTIER 2.75X26.   Post intervention, there is a 0% residual stenosis.   Post intervention, there is a 0% residual stenosis.   Post intervention, there is a 0% residual stenosis.   Successful Impella supported high risk PCI of the ostial LAD with atherectomy and drug-eluting stent placement.  The stent extended 1 to 2 mm into the left main coronary artery to ensure coverage of the ostium.  There was normal flow into the  left circumflex. Moderately elevated left ventricular end-diastolic pressure at 24 mmHg at the beginning of the case.   Recommendations: Dual antiplatelet therapy for at least 12 months and preferably longer. Aggressive treatment of risk factors. Optimize heart failure therapy.  Monitor renal function closely.  135 mL of contrast was used.   Diagnostic Dominance: Right Intervention     Patient Profile     87 y.o. male with a history of CAD, ICM, HFrEF, HTN, HL, DMII, GERD, CVA, PAD, and carotid arterial dzs, who presented October 19 due to progressive dyspnea,  increasing abdominal girth, lower extremity edema.  Elevated troponin to 1183.  EF 25 to 30% with global hypokinesis and severe anterior, septal, and apical hypokinesis on echo this admission.  Assessment & Plan    NSTEMI/Known Multivessel CAD: H/o of multivessel CAD previously seen by CT surgery and deemed not a good surgical candidate. Presented to [facility] 10/19 with CHF. BNP and troponin elevated. EKG showed septal infarct with inferior T wave changes. Echo showed worsening EF 20-25% (prior was 35-40% 11/2020). He eventually underwent repeat heart cath showing unchanged anatomy from heart cath 11/2020 (details of cath above).  He was transferred to [facility] for high risk PCI.  Underwent successful Impella supported high risk PCI of ostial LAD with atherectomy and DES x1.  Stent did extend 1 to 2 mm into the left main coronary artery to ensure coverage of the ostium.  No complications noted.  Recommendations for DAPT with aspirin/Plavix for at least 1 year preferably longer.  Cardiac rehab following -- Continue ASA, plavix, statin, BB   Acute on chronic HFrEF/ICM: EF 35-40% by echo 11/2020. Echo this admission showed decline in EF 20-25% with global HK and more severe HK of the anterior, septal and apical regions.  -- GDMT is limited in the setting of soft blood pressures.  Currently on Toprol XL 50mg  daily, unable to add ARB/Entresto/spiro -- net -1.6L, will hold additional diuresis with elevated Cr  Hypoxia: remains on O2 3L, CXR yesterday with bilateral effusions, atelectasis.  -- needs to work with CR and PT today, discussed with patient and daughter. He is willing to try -- incentive  spiro ordered    HLD:  LDL 29 07/2021 -- continue atorvastatin   DM: A1C 6.7 -- SSI during admission -- allergy to Jardiance   PAD: continue ASA and statin -- lower anatomy reviewed on recent cath   CKD stage 3: monitor with diuresis and post-cath, Cr up from 1.2-1.4 today. Holding additional diuresis this  morning -- daily BMET   Bigeminy: improved, K+ 4.3, mag 1.8 -- continue BB therapy  Moderate to severe MR: on echo 10/21. Discussed with daughter in detail. With his ongoing hypoxia, not a good candidate currently for consideration of TEE.   For questions or updates, please contact [facility] Please consult [email or web address] for contact info under     Signed, Reino Bellis, NP  08/17/2021, 9:29 AM    Patient seen, examined. Available data reviewed. Agree with findings, assessment, and plan as outlined by Reino Bellis, NP.  The patient is independently interviewed and examined.  He is an elderly male in no distress.  He is sitting up in a chair at the bedside with oxygen per nasal cannula.  JVP is normal, lungs are diminished in the bases bilaterally with inspiratory rales, heart is regular rate and rhythm with a 3/6 holosystolic murmur at the apex, extremities have chronic stasis changes with minimal edema, strength is intact and equal bilaterally, abdomen is soft, nontender, and nondistended.  Telemetry is reviewed and shows sinus rhythm with frequent PVCs and few short ventricular runs.  Discussed with patient's RN and physical therapist.  He had oxygen desaturation with walking down to 83% with O2 per nasal cannula.  His oxygen level quickly normalized with rest.  I think he is making slow progress and will add a low-dose oral loop diuretic today.  I do not think he is going to tolerate aggressive heart failure medical therapy in the setting of his advanced age and mild chronic kidney disease.  We will add furosemide 20 mg and spironolactone 12.5 mg daily.  We will continue her reduced dose of metoprolol succinate 50 mg daily.  Hold off on ACE/ARB/Entresto.  I had a lengthy discussion with the patient and his daughter today.  They understand that he has very severe LV dysfunction with LVEF 20 to 25%.  He also has at least moderately severe mitral regurgitation.  He lives at home with his  girlfriend who currently has COVID-19.  He is going to need to stay with his daughter and she is working on making all of the arrangements at her house.  The patient will need to be discharged on home oxygen with home health services.  I would anticipate that we continue to optimize his medical therapy over the weekend and that he will be medically stable for discharge on Monday.  Sherren Mocha, M.D. 08/17/2021 12:03 PM

## 2021-08-17 NOTE — Evaluation (Signed)
Physical Therapy Evaluation Patient Details Name: Henry Lindsey MRN: [id number] DOB: 08-02-1933 Today's Date: 08/17/2021  History of Present Illness  87 yo male with onset of SOB and hypoxia for a week was brought to hosp to admit 10/20.  Had productive cough, now clear.  Requires 4L O2 at admit, currently on 3L O2 with no prev use of it.  Cardiology dx acute CHF, ischemic cardiomyopathy with demand ischemia and elevated troponin, noted elevated LFT's from heart failure.  Pt received heart cath 10/25, stent done 10/26.  Gait now for  home is encouraged.  PMHx: CHF, EF 35%, HTN, HLD, DM, COPD, stroke, GERD, PVD, R PTA SFA, CAD, carotid artery stenosis, CKD 3, diverticulitis,  Clinical Impression  Pt was assisted to walk on RW with help both to manage lines and to instruct pursed lip breathing on 3L O2.  Reported to MD that pt dropped to 83% even on O2 and with breathing techniques could recover to 98% finally.  Pt is distracted and requires reminders to use the breathing techniques even during recovery.  Follow along with him to increase endurance and work on standing balance and tolerance, monitoring O2 sats for entire session.  Educate pt on correct techniques for breathing, and pt will follow up with HHPT when ready.  Planning initially to go home with daughter, then home to his girlfriend who is quarantining now.        Recommendations for follow up therapy are one component of a multi-disciplinary discharge planning process, led by the attending physician.  Recommendations may be updated based on patient status, additional functional criteria and insurance authorization.  Follow Up Recommendations Home health PT    Assistance Recommended at Discharge Frequent or constant Supervision/Assistance  Functional Status Assessment    Equipment Recommendations  Rolling walker (2 wheels)    Recommendations for Other Services       Precautions / Restrictions Precautions Precautions:  Fall Precaution Comments: no complaints on skin Restrictions Weight Bearing Restrictions: No      Mobility  Bed Mobility               General bed mobility comments: up in chair when PT arrived    Transfers Overall transfer level: Needs assistance Equipment used: Rolling walker (2 wheels) Transfers: Sit to/from Stand Sit to Stand: Min assist           General transfer comment: min assist to give support during effort to stand and monitor vitals    Ambulation/Gait Ambulation/Gait assistance: Min guard Gait Distance (Feet): 40 Feet Assistive device: Rolling walker (2 wheels) Gait Pattern/deviations: Step-through pattern;Decreased stride length;Wide base of support Gait velocity: reduced Gait velocity interpretation: <1.31 ft/sec, indicative of household ambulator General Gait Details: pt is steadying on RW, requiring help mainly to manage lines esp O2  Stairs            Wheelchair Mobility    Modified Rankin (Stroke Patients Only)       Balance Overall balance assessment: Needs assistance Sitting-balance support: Feet supported Sitting balance-Leahy Scale: Good Sitting balance - Comments: on O2 and supervised by PT   Standing balance support: Bilateral upper extremity supported;During functional activity Standing balance-Leahy Scale: Fair Standing balance comment: less than fair dynamically                             Pertinent Vitals/Pain Pain Assessment: Faces Faces Pain Scale: No hurt Breathing: occasional labored breathing, short period of  hyperventilation Negative Vocalization: none Facial Expression: smiling or inexpressive Body Language: relaxed    Home Living                          Prior Function                       Hand Dominance        Extremity/Trunk Assessment                Communication      Cognition Arousal/Alertness: Awake/alert Behavior During Therapy: WFL for tasks  assessed/performed Overall Cognitive Status: Within Functional Limits for tasks assessed                                          General Comments General comments (skin integrity, edema, etc.): pt was assisted on O2 to walk in the room, pt is desaturating with all effort on 3L O2.   After walk 40' required pursed lip breathing instructions when he suddenly dropped to 83% from 98%    Exercises     Assessment/Plan    PT Assessment    PT Problem List         PT Treatment Interventions      PT Goals (Current goals can be found in the Care Plan section)  Acute Rehab PT Goals Patient Stated Goal: reports he is ready to get home soon PT Goal Formulation: With patient/family Time For Goal Achievement: 08/31/21 Potential to Achieve Goals: Good    Frequency Min 2X/week   Barriers to discharge        Co-evaluation               AM-PAC PT "6 Clicks" Mobility  Outcome Measure Help needed turning from your back to your side while in a flat bed without using bedrails?: A Little Help needed moving from lying on your back to sitting on the side of a flat bed without using bedrails?: A Little Help needed moving to and from a bed to a chair (including a wheelchair)?: A Little Help needed standing up from a chair using your arms (e.g., wheelchair or bedside chair)?: A Little Help needed to walk in hospital room?: A Little Help needed climbing 3-5 steps with a railing? : Total 6 Click Score: 16    End of Session Equipment Utilized During Treatment: Gait belt;Oxygen Activity Tolerance: Treatment limited secondary to medical complications (Comment) Patient left: in chair;with chair alarm set;with family/visitor present Nurse Communication: Mobility status PT Visit Diagnosis: Unsteadiness on feet (R26.81);Muscle weakness (generalized) (M62.81)    Time: 1245-8099 PT Time Calculation (min) (ACUTE ONLY): 34 min   Charges:   PT Evaluation $PT Eval Moderate  Complexity: 1 Mod PT Treatments $Gait Training: 8-22 mins       Ramond Dial 08/17/2021, 1:38 PM  Mee Hives, PT PhD Acute Rehab Dept. Number: [place] [facility] [place]

## 2021-08-17 NOTE — TOC Initial Note (Signed)
Transition of Care [facility]) - Initial/Assessment Note    Patient Details  Name: Henry Lindsey MRN: [id number] Date of Birth: September 07, 1933  Transition of Care [facility]) CM/SW Contact:    Bethena Roys, RN Phone Number: 08/17/2021, 4:12 PM  Clinical Narrative:  Risk for readmission assessment completed. Case Manager spoke with patient and daughter [name] to discuss home health needs. Family chose [facility] for services. Referral submitted for RN and PT Services and start of care to begin within 24-48 hours post transition home. Will need HH orders and F2F. Patient has bedside commode and a rolling walker in the home. In need of oxygen for home. Case Manager called [employer] to follow. Case Manager asked for orders- in secure chat. Weekend Case Manager to follow.                  Expected Discharge Plan: [place] Barriers to Discharge: No Barriers Identified   Patient Goals and CMS Choice Patient states their goals for this hospitalization and ongoing recovery are:: to return home with home health services CMS Medicare.gov Compare Post Acute Care list provided to:: Patient Choice offered to / list presented to : NA, Patient, Adult Children  Expected Discharge Plan and Services Expected Discharge Plan: [place] In-house Referral: Clinical Social Work Discharge Planning Services: CM Consult Post Acute Care Choice: [place] arrangements for the past 2 months: Single Family Home Expected Discharge Date: 08/15/21               DME Arranged: N/A DME Agency: NA       HH Arranged: RN, Disease Management, PT [place] Agency: [place] ([facility]) Date HH Agency Contacted: 08/17/21 Time HH Agency Contacted: 96 Representative spoke with at [place]: [place] Arrangements/Services Living arrangements for the past 2 months: [place] with:: Significant Other Patient language and need for  interpreter reviewed:: Yes Do you feel safe going back to the place where you live?: Yes      Need for Family Participation in Patient Care: Yes (Comment) Care giver support system in place?: Yes (comment) Current home services: DME (patient has rolling walker and bedside commode.) Criminal Activity/Legal Involvement Pertinent to Current Situation/Hospitalization: No - Comment as needed  Activities of Daily Living      Permission Sought/Granted Permission sought to share information with : Family Supports, Case Freight forwarder, Chartered certified accountant granted to share information with : Yes, Verbal Permission Granted     Permission granted to share info w AGENCY: [place]        Emotional Assessment Appearance:: Appears stated age Attitude/Demeanor/Rapport: Engaged Affect (typically observed): Appropriate Orientation: : Oriented to Self, Oriented to Place, Oriented to  Time, Oriented to Situation Alcohol / Substance Use: Not Applicable Psych Involvement: No (comment)  Admission diagnosis:  Acute on chronic systolic (congestive) heart failure (HCC) [I50.23] Patient Active Problem List   Diagnosis Date Noted   Acute on chronic systolic (congestive) heart failure ([place]) 08/15/2021   Cardiomyopathy ([place]) 08/14/2021   NSTEMI (non-ST elevated myocardial infarction) ([place])    Demand ischemia ([place])    Acute on chronic HFrEF (heart failure with reduced ejection fraction) ([place]) 08/09/2021   Type II diabetes mellitus with renal manifestations ([place]) 08/09/2021   COPD (chronic obstructive pulmonary disease) ([place])    CKD (chronic kidney disease), stage IIIa    Acute respiratory failure with hypoxia (HCC)    Elevated troponin  Abnormal LFTs    Swelling of limb 03/27/2021   Ischemic cardiomyopathy 11/24/2020   Unstable angina ([place]) 11/24/2020   Acute on chronic combined systolic and diastolic CHF (congestive heart failure) ([place]) 11/24/2020   Postoperative visit  08/22/2020   S/P TKR (total knee replacement) using cement, right 08/08/2020   Open wound of lower leg 05/29/2020   Plantar fasciitis 03/22/2020   Lumbar radiculopathy 07/05/2019   Chronic obstructive pulmonary disease ([place]) 06/11/2019   Gastroesophageal reflux disease without esophagitis 06/11/2019   Neuropathy 06/11/2019   Recurrent productive cough 03/27/2019   Chronic diarrhea    Benign neoplasm of transverse colon    Chronic iron deficiency anemia 10/09/2017   History of diverticulitis 10/05/2017   S/P laparoscopic cholecystectomy 12/05/2016   Bile duct leak    Chest pain 10/05/2016   Pain in joint, ankle and foot 02/25/2015   Diabetic neuropathy ([place]) 10/09/2014   BPH with obstruction/lower urinary tract symptoms 10/09/2014   Bursitis of hip, right 04/05/2014   Glaucoma 04/05/2014   BPH (benign prostatic hyperplasia) 04/05/2014   Peripheral autonomic neuropathy due to DM ([place]) 12/28/2013   Vitamin D deficiency 11/03/2013   Incomplete emptying of bladder 08/04/2013   Benign localized hyperplasia of prostate with urinary obstruction and other lower urinary tract symptoms (LUTS)(600.21) 06/24/2013   Nocturia 06/24/2013   Orchalgia 06/24/2013   Impotence due to erectile dysfunction 06/01/2013   Routine general medical examination at a health care facility 04/04/2013   CAD (coronary artery disease) 07/21/2012   Peripheral vascular disease in diabetes mellitus ([place])    Carotid artery stenosis with cerebral infarction ([place])    History of basal cell carcinoma excision 08/05/2011   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/05/2011   Uncontrolled type 2 diabetes mellitus with diabetic polyneuropathy, with long-term current use of insulin 08/05/2011   Stroke ([place]) 08/05/2011   Hyperlipidemia    Hypertension    History of CVA (cerebrovascular accident)    PCP:  Baxter Hire, MD Pharmacy:   [facility] [place], [address] [place] [address] Phone: [phone] Fax: [place], [place] [address] [place] [address] Phone: [phone] Fax: [phone]  Readmission Risk Interventions Readmission Risk Prevention Plan 08/17/2021  Transportation Screening Complete  PCP or Specialist Appt within 3-5 Days Complete  HRI or Home Care Consult Complete  Social Work Consult for [place] Planning/Counseling Complete  Palliative Care Screening Not Applicable  Medication Review Press photographer) Complete  Some recent data might be hidden

## 2021-08-17 NOTE — Progress Notes (Signed)
CARDIAC REHAB PHASE I   PRE:  Rate/Rhythm: 71 SR  BP:  Sitting: 121/76      SaO2: 100 2L   MODE:  Ambulation: stand and pivot to chair  POST:  Rate/Rhythm: 78 SR  BP:  Sitting: 137/61    SaO2: 83 RA --> 95 2L   Pt ready to get out of bed. Pt assisted to EOB and stood heavy assist with gait belt to pivot to chair. Pt denies CP, SOB, or dizziness. Sats noticed to be decreased, placed back on 2L [place] and coached through purse lipped breathing. Reinforced stent/MI education. Referred to CRP II [place] to meet the requirements.   [id number] Rufina Falco, RN BSN 08/17/2021 11:11 AM

## 2021-08-18 DIAGNOSIS — I2510 Atherosclerotic heart disease of native coronary artery without angina pectoris: Secondary | ICD-10-CM

## 2021-08-18 DIAGNOSIS — I255 Ischemic cardiomyopathy: Secondary | ICD-10-CM

## 2021-08-18 DIAGNOSIS — I2583 Coronary atherosclerosis due to lipid rich plaque: Secondary | ICD-10-CM

## 2021-08-18 DIAGNOSIS — I340 Nonrheumatic mitral (valve) insufficiency: Secondary | ICD-10-CM

## 2021-08-18 DIAGNOSIS — I200 Unstable angina: Secondary | ICD-10-CM | POA: Diagnosis not present

## 2021-08-18 DIAGNOSIS — I5023 Acute on chronic systolic (congestive) heart failure: Secondary | ICD-10-CM | POA: Diagnosis not present

## 2021-08-18 DIAGNOSIS — N179 Acute kidney failure, unspecified: Secondary | ICD-10-CM

## 2021-08-18 LAB — BASIC METABOLIC PANEL
Anion gap: 7 (ref 5–15)
BUN: 33 mg/dL — ABNORMAL HIGH (ref 8–23)
CO2: 28 mmol/L (ref 22–32)
Calcium: 8.6 mg/dL — ABNORMAL LOW (ref 8.9–10.3)
Chloride: 98 mmol/L (ref 98–111)
Creatinine, Ser: 1.34 mg/dL — ABNORMAL HIGH (ref 0.61–1.24)
GFR, Estimated: 52 mL/min — ABNORMAL LOW (ref >60)
Glucose, Bld: 142 mg/dL — ABNORMAL HIGH (ref 70–99)
Potassium: 4.4 mmol/L (ref 3.5–5.1)
Sodium: 133 mmol/L — ABNORMAL LOW (ref 135–145)

## 2021-08-18 LAB — GLUCOSE, CAPILLARY
Glucose-Capillary: 204 mg/dL — ABNORMAL HIGH (ref 70–99)
Glucose-Capillary: 154 mg/dL — ABNORMAL HIGH (ref 70–99)
Glucose-Capillary: 180 mg/dL — ABNORMAL HIGH (ref 70–99)

## 2021-08-18 MED ORDER — ONDANSETRON HCL 4 MG PO TABS
4.0000 mg | ORAL_TABLET | Freq: Three times a day (TID) | ORAL | Status: DC | PRN
  Administered 2021-08-19: 4 mg via ORAL
  Filled 2021-08-18: qty 1

## 2021-08-18 MED ORDER — FLEET ENEMA 7-19 GM/118ML RE ENEM
1.0000 | ENEMA | Freq: Once | RECTAL | Status: AC
  Administered 2021-08-18: 1 via RECTAL
  Filled 2021-08-18: qty 1

## 2021-08-18 MED ORDER — INFLUENZA VAC A&B SA ADJ QUAD 0.5 ML IM PRSY
0.5000 mL | PREFILLED_SYRINGE | INTRAMUSCULAR | Status: DC
  Filled 2021-08-18: qty 0.5

## 2021-08-18 MED ORDER — DOCUSATE SODIUM 100 MG PO CAPS
100.0000 mg | ORAL_CAPSULE | Freq: Two times a day (BID) | ORAL | Status: DC
  Administered 2021-08-18 – 2021-08-20 (×2): 100 mg via ORAL
  Filled 2021-08-18 (×3): qty 1

## 2021-08-18 NOTE — Progress Notes (Addendum)
Progress Note  Patient Name: Henry Lindsey Date of Encounter: 08/18/2021  [facility] Cardiologist: Ida Rogue, MD   Subjective   Patient is feeling good and denies any chest pain or shortness of breath   Inpatient Medications    Scheduled Meds:  ascorbic acid  500 mg Oral Daily   aspirin EC  81 mg Oral Daily   atorvastatin  40 mg Oral Daily   cholecalciferol  1,000 Units Oral Daily   clopidogrel  75 mg Oral Daily   furosemide  20 mg Oral Daily   gabapentin  400 mg Oral BID   insulin aspart protamine- aspart  15 Units Subcutaneous BID WC   latanoprost  2 drop Both Eyes Daily   lidocaine  1 patch Transdermal Q24H   metoprolol succinate  50 mg Oral Daily   multivitamin with minerals  1 tablet Oral Daily   polyethylene glycol  17 g Oral Daily   sodium chloride flush  3 mL Intravenous Q12H   spironolactone  12.5 mg Oral Daily   timolol  2 drop Both Eyes Q1500   vitamin E  400 Units Oral Daily   Continuous Infusions:  sodium chloride     PRN Meds: sodium chloride, HYDROcodone-acetaminophen, sodium chloride flush, traZODone   Vital Signs    Vitals:   08/17/21 0434 08/17/21 1523 08/17/21 1945 08/18/21 0445  BP: 117/68 122/83 127/74 120/86  Pulse: 63 76 73 74  Resp: 13 (!) 21 19 16   Temp: (!) 97.3 F (36.3 C) 97.8 F (36.6 C) 97.9 F (36.6 C) (!) 97.4 F (36.3 C)  TempSrc: Axillary Oral Oral Oral  SpO2: 100% 95% 98% 100%    Intake/Output Summary (Last 24 hours) at 08/18/2021 8119 Last data filed at 08/18/2021 0400 Gross per 24 hour  Intake --  Output 650 ml  Net -650 ml    Last 3 Weights 08/15/2021 08/14/2021 08/13/2021  Weight (lbs) 202 lb 13.2 oz 203 lb 0.7 oz 204 lb 9.6 oz  Weight (kg) 92 kg 92.1 kg 92.806 kg      Telemetry    Sinus rhythm with occasional PVCs and 1 short run of paroxysmal atrial tachycardia- Personally Reviewed  ECG    No new EKG to review  Physical Exam   GEN: Well nourished, well developed in no acute  distress HEENT: Normal NECK: No JVD; No carotid bruits LYMPHATICS: No lymphadenopathy CARDIAC:RRR, no rubs, gallops, 2/6 systolic murmur at left lower sternal border to apex RESPIRATORY:  Clear to auscultation without rales, wheezing or rhonchi  ABDOMEN: Soft, non-tender, non-distended MUSCULOSKELETAL:  No edema; No deformity  SKIN: Warm and dry NEUROLOGIC:  Alert and oriented x 3 PSYCHIATRIC:  Normal affect   Labs    High Sensitivity Troponin:   Recent Labs  Lab 08/09/21 1231 08/09/21 1409 08/09/21 1658 08/09/21 2151 08/10/21 0643  TROPONINIHS 533* 580* 816* 1,183* 1,076*      Chemistry Recent Labs  Lab 08/12/21 0617 08/14/21 0751 08/16/21 0219 08/17/21 0311 08/18/21 0132  NA 136   < > 131* 134* 133*  K 3.7   < > 4.0 4.3 4.4  CL 97*   < > 97* 99 98  CO2 28   < > 27 28 28   GLUCOSE 131*   < > 149* 113* 142*  BUN 24*   < > 28* 33* 33*  CREATININE 1.26*   < > 1.25* 1.43* 1.34*  CALCIUM 8.3*   < > 8.4* 8.4* 8.6*  MG  --   --  1.8  --   --   PROT 7.2  --   --  6.8  --   ALBUMIN 2.5*  --   --  2.5*  --   AST 70*  --   --  45*  --   ALT 52*  --   --  37  --   ALKPHOS 145*  --   --  135*  --   BILITOT 1.7*  --   --  1.5*  --   GFRNONAA 56*   < > 56* 48* 52*  ANIONGAP 11   < > 7 7 7    < > = values in this interval not displayed.     Lipids No results for input(s): CHOL, TRIG, HDL, LABVLDL, LDLCALC, CHOLHDL in the last 168 hours.  Hematology Recent Labs  Lab 08/15/21 0458 08/15/21 1338 08/16/21 0219  WBC 5.5 5.2 5.7  RBC 4.93 4.84 4.97  HGB 12.5* 11.7* 12.1*  HCT 39.8 40.1 40.6  MCV 80.7 82.9 81.7  MCH 25.4* 24.2* 24.3*  MCHC 31.4 29.2* 29.8*  RDW 19.0* 19.1* 19.2*  PLT 213 204 220    Thyroid No results for input(s): TSH, FREET4 in the last 168 hours.  BNPNo results for input(s): BNP, PROBNP in the last 168 hours.  DDimer No results for input(s): DDIMER in the last 168 hours.   Radiology    DG Chest 2 View  Result Date: 08/16/2021 CLINICAL DATA:   Difficulty breathing EXAM: CHEST - 2 VIEW COMPARISON:  Previous studies including the examination of 08/09/2021 FINDINGS: Transverse diameter of heart is increased. Central pulmonary vessels are prominent without signs of alveolar pulmonary edema. There is blunting of both posterior costophrenic angles suggesting bilateral pleural effusions, more so on the right side. There is interval worsening of subsegmental atelectasis in right parahilar region. There is no pneumothorax. IMPRESSION: Cardiomegaly. Central pulmonary vessels are prominent without signs of pulmonary edema. Bilateral pleural effusions, more so on the right side. Linear densities in right parahilar region suggests more prominent suggesting worsening subsegmental atelectasis. Reading location: [place], [place]. Electronically Signed   By: Elmer Picker M.D.   On: 08/16/2021 13:18    Cardiac Studies   Echo: 08/10/21   IMPRESSIONS     1. Left ventricular ejection fraction, by estimation, is 25 to 30%. The  left ventricle has severely decreased function. The left ventricle  demonstrates global hypokinesis with severe hypokinesis of the anterior,  septal, and apical region. Inferior wall  best preserved. Left ventricular diastolic parameters are consistent with  Grade II diastolic dysfunction (pseudonormalization). The average left  ventricular global longitudinal strain is -6.3 %. The global longitudinal  strain is abnormal.   2. Right ventricular systolic function is moderately reduced. The right  ventricular size is moderately enlarged. There is mildly elevated  pulmonary artery systolic pressure. The estimated right ventricular  systolic pressure is 52.7 mmHg.   3. Left atrial size was moderately dilated.   4. The mitral valve is normal in structure. Moderate to severe mitral  valve regurgitation.   5. Tricuspid valve regurgitation is mild to moderate.   6. Aortic dilatation noted. There is mild dilatation of the  aortic root,  measuring 39 mm.   7. The inferior vena cava is dilated in size with <50% respiratory  variability, suggesting right atrial pressure of 15 mmHg.   FINDINGS   Left Ventricle: Left ventricular ejection fraction, by estimation, is 25  to 30%. The left ventricle has severely decreased function. The  left  ventricle demonstrates global hypokinesis. The average left ventricular  global longitudinal strain is -6.3 %.  The global longitudinal strain is abnormal. The left ventricular internal  cavity size was normal in size. There is no left ventricular hypertrophy.  Left ventricular diastolic parameters are consistent with Grade II  diastolic dysfunction  (pseudonormalization).   Right Ventricle: The right ventricular size is moderately enlarged. No  increase in right ventricular wall thickness. Right ventricular systolic  function is moderately reduced. There is mildly elevated pulmonary artery  systolic pressure. The tricuspid  regurgitant velocity is 2.58 m/s, and with an assumed right atrial  pressure of 15 mmHg, the estimated right ventricular systolic pressure is  16.0 mmHg.   Left Atrium: Left atrial size was moderately dilated.   Right Atrium: Right atrial size was normal in size.   Pericardium: There is no evidence of pericardial effusion.   Mitral Valve: The mitral valve is normal in structure. Moderate to severe  mitral valve regurgitation, with eccentric posteriorly directed jet. No  evidence of mitral valve stenosis.   Tricuspid Valve: The tricuspid valve is normal in structure. Tricuspid  valve regurgitation is mild to moderate. No evidence of tricuspid  stenosis.   Aortic Valve: The aortic valve was not well visualized. Aortic valve  regurgitation is not visualized. Mild aortic valve sclerosis is present,  with no evidence of aortic valve stenosis. Aortic valve mean gradient  measures 4.0 mmHg. Aortic valve peak  gradient measures 6.9 mmHg. Aortic valve  area, by VTI measures 2.57 cm.   Pulmonic Valve: The pulmonic valve was normal in structure. Pulmonic valve  regurgitation is mild to moderate. No evidence of pulmonic stenosis.   Aorta: Aortic dilatation noted. There is mild dilatation of the aortic  root, measuring 39 mm.   Venous: The pulmonary veins were not well visualized. The inferior vena  cava is dilated in size with less than 50% respiratory variability,  suggesting right atrial pressure of 15 mmHg.   IAS/Shunts: No atrial level shunt detected by color flow Doppler.    Cath: 08/14/21   Conclusions: Severe multivessel coronary artery disease, including 80-90% ostial LAD stenosis and chronic total occlusion of the proximal RCA, a seen on prior catheterization in 11/2020. Moderately elevated left heart, right heart, and pulmonary artery pressures. Moderately-severely reduced cardiac output/index. Ectatic abdominal aorta common/external iliac arteries, and common femoral arteries without significant disease.  Focal 70% right internal iliac artery stenosis is noted. 50% ostial stenosis of right renal artery.   Recommendations: Transfer to [facility] for consideration of high risk PCI to LMCA/ostial LAD.  Given heavy calcification and reduced LVEF, atherectomy and Impella support will need to be considered.  I will load the patient with clopidogrel 600 mg today, followed by 75 mg daily thereafter. Maintain net even to slightly negative fluid balance following contrast exposure today and chronic kidney disease. Escalate goal-directed medical therapy of acute on chronic HFrEF, as tolerated. Aggressive secondary prevention of coronary artery disease.   Nelva Bush, MD   Diagnostic Dominance: Right Cath: 08/15/21     Mid LM to Dist LM lesion is 30% stenosed.   Ost LAD lesion is 85% stenosed.   Prox LAD lesion is 50% stenosed.   Dist LAD lesion is 35% stenosed.   Prox Cx lesion is 50% stenosed.   Prox RCA to Dist RCA lesion  is 100% stenosed.   1st Diag lesion is 80% stenosed.   1st Mrg lesion is 70% stenosed.   A drug-eluting  stent was successfully placed using a STENT ONYX FRONTIER [id number].   Post intervention, there is a 0% residual stenosis.   Post intervention, there is a 0% residual stenosis.   Post intervention, there is a 0% residual stenosis.   Successful Impella supported high risk PCI of the ostial LAD with atherectomy and drug-eluting stent placement.  The stent extended 1 to 2 mm into the left main coronary artery to ensure coverage of the ostium.  There was normal flow into the  left circumflex. Moderately elevated left ventricular end-diastolic pressure at 24 mmHg at the beginning of the case.   Recommendations: Dual antiplatelet therapy for at least 12 months and preferably longer. Aggressive treatment of risk factors. Optimize heart failure therapy.  Monitor renal function closely.  135 mL of contrast was used.   Diagnostic Dominance: Right Intervention     Patient Profile     87 y.o. male with a history of CAD, ICM, HFrEF, HTN, HL, DMII, GERD, CVA, PAD, and carotid arterial dzs, who presented October 19 due to progressive dyspnea, increasing abdominal girth, lower extremity edema.  Elevated troponin to 1183.  EF 25 to 30% with global hypokinesis and severe anterior, septal, and apical hypokinesis on echo this admission.  Assessment & Plan    NSTEMI/Known Multivessel CAD: H/o of multivessel CAD previously seen by CT surgery and deemed not a good surgical candidate. [facility] to [name]19 with CHF. BNP and troponin elevated. EKG showed septal infarct with inferior T wave changes. Echo showed worsening EF 20-25% (prior was 35-40% 11/2020). He eventually underwent repeat heart cath showing unchanged anatomy from heart cath 11/2020 (details of cath above).  He was transferred to [facility] for high risk PCI.  Underwent successful Impella supported high risk PCI of ostial LAD with atherectomy and  DES x1.  Stent did extend 1 to 2 mm into the left main coronary artery to ensure coverage of the ostium.  No complications noted.  Recommendations for DAPT with aspirin/Plavix for at least 1 year preferably longer.  Cardiac rehab following -He has not had any further chest pain or shortness of breath  -We will continue on aspirin 81 mg daily, Plavix 75 mg daily, high-dose statin and beta-blocker    Acute on chronic HFrEF/ICM: EF 35-40% by echo 11/2020. Echo this admission showed decline in EF 20-25% with global HK and more severe HK of the anterior, septal and apical regions.  -- GDMT is limited in the setting of soft blood pressures.   -- Continue Toprol-XL 50 mg daily, spironolactone 12.5 mg daily and Lasix 20 mg daily>> stable after adding diuretics yesterday -- No ARB, Entresto at this time due to AKI - felt to not likely tolerate aggressive heart failure medical therapy in the setting of his advanced age and mild chronic kidney disease --150 cc yesterday and is net -2.3 L --He does not appear volume overloaded on exam today --Serum creatinine is stable at 1.34 and potassium 4.4  Hypoxia: r -- O2 has been titrated down to 2 L with O2 sats 100%  -- needs to work with CR and PT  -- incentive spiro ordered    HLD:  LDL 29 07/2021 -- continue atorvastatin 40 mg daily   DM: A1C 6.7 -- SSI during admission -- allergy to Jardiance   PAD: continue ASA and statin -- lower anatomy reviewed on recent cath   CKD stage 3: monitor with diuresis and post-cath -- Serum creatinine improved from 1.43-1.34 today  --Renal function stable  after starting spironolactone 12.5 mg daily and Lasix 20 mg daily yesterday -- daily BMET   Bigeminy: improved, K+ 4.4 and mag 1.8 -- continue BB therapy  Moderate to severe MR: on echo 10/21.  --With his ongoing hypoxia, not a good candidate currently for consideration of TEE.   Paroxysmal atrial tachycardia -He had 1 short run on telemetry last night  asymptomatic -Continue Toprol-XL 50 mg daily  I have spent a total of 30 minutes with patient reviewing hospital notes , telemetry, EKGs, labs and examining patient as well as establishing an assessment and plan that was discussed with the patient.  > 50% of time was spent in direct patient care.     For questions or updates, please contact [place] Please consult [email or web address] for contact info under     Signed, Fransico Him, MD  08/18/2021, 6:53 AM    08/18/2021 6:53 AM

## 2021-08-19 DIAGNOSIS — I2510 Atherosclerotic heart disease of native coronary artery without angina pectoris: Secondary | ICD-10-CM | POA: Diagnosis not present

## 2021-08-19 DIAGNOSIS — I255 Ischemic cardiomyopathy: Secondary | ICD-10-CM | POA: Diagnosis not present

## 2021-08-19 DIAGNOSIS — I200 Unstable angina: Secondary | ICD-10-CM | POA: Diagnosis not present

## 2021-08-19 DIAGNOSIS — I5023 Acute on chronic systolic (congestive) heart failure: Secondary | ICD-10-CM | POA: Diagnosis not present

## 2021-08-19 LAB — BASIC METABOLIC PANEL
Anion gap: 9 (ref 5–15)
BUN: 27 mg/dL — ABNORMAL HIGH (ref 8–23)
CO2: 27 mmol/L (ref 22–32)
Calcium: 8.7 mg/dL — ABNORMAL LOW (ref 8.9–10.3)
Chloride: 97 mmol/L — ABNORMAL LOW (ref 98–111)
Creatinine, Ser: 1.18 mg/dL (ref 0.61–1.24)
GFR, Estimated: >60 mL/min (ref >60)
Glucose, Bld: 116 mg/dL — ABNORMAL HIGH (ref 70–99)
Potassium: 4.3 mmol/L (ref 3.5–5.1)
Sodium: 133 mmol/L — ABNORMAL LOW (ref 135–145)

## 2021-08-19 LAB — GLUCOSE, CAPILLARY
Glucose-Capillary: 105 mg/dL — ABNORMAL HIGH (ref 70–99)
Glucose-Capillary: 149 mg/dL — ABNORMAL HIGH (ref 70–99)
Glucose-Capillary: 207 mg/dL — ABNORMAL HIGH (ref 70–99)
Glucose-Capillary: 179 mg/dL — ABNORMAL HIGH (ref 70–99)
Glucose-Capillary: 158 mg/dL — ABNORMAL HIGH (ref 70–99)

## 2021-08-19 NOTE — Progress Notes (Addendum)
Progress Note  Patient Name: Henry Lindsey Date of Encounter: 08/19/2021  [facility] Cardiologist: Ida Rogue, MD   Subjective   Had a good night.  Denies any chest pain or SOB  Inpatient Medications    Scheduled Meds:  ascorbic acid  500 mg Oral Daily   aspirin EC  81 mg Oral Daily   atorvastatin  40 mg Oral Daily   cholecalciferol  1,000 Units Oral Daily   clopidogrel  75 mg Oral Daily   docusate sodium  100 mg Oral BID   furosemide  20 mg Oral Daily   gabapentin  400 mg Oral BID   influenza vaccine adjuvanted  0.5 mL Intramuscular Tomorrow-1000   insulin aspart protamine- aspart  15 Units Subcutaneous BID WC   latanoprost  2 drop Both Eyes Daily   lidocaine  1 patch Transdermal Q24H   metoprolol succinate  50 mg Oral Daily   multivitamin with minerals  1 tablet Oral Daily   polyethylene glycol  17 g Oral Daily   sodium chloride flush  3 mL Intravenous Q12H   spironolactone  12.5 mg Oral Daily   timolol  2 drop Both Eyes Q1500   vitamin E  400 Units Oral Daily   Continuous Infusions:  sodium chloride     PRN Meds: sodium chloride, HYDROcodone-acetaminophen, ondansetron, sodium chloride flush, traZODone   Vital Signs    Vitals:   08/18/21 0445 08/18/21 0851 08/18/21 1431 08/18/21 1928  BP: 120/86 135/76 132/70 133/73  Pulse: 74 80 76 79  Resp: 16   16  Temp: (!) 97.4 F (36.3 C)  98.3 F (36.8 C) (!) 97.4 F (36.3 C)  TempSrc: Oral  Oral Oral  SpO2: 100%  99% 98%    Intake/Output Summary (Last 24 hours) at 08/19/2021 0804 Last data filed at 08/19/2021 0700 Gross per 24 hour  Intake 240 ml  Output 901 ml  Net -661 ml    Last 3 Weights 08/15/2021 08/14/2021 08/13/2021  Weight (lbs) 202 lb 13.2 oz 203 lb 0.7 oz 204 lb 9.6 oz  Weight (kg) 92 kg 92.1 kg 92.806 kg      Telemetry    NSR- Personally Reviewed  ECG    No new EKG to review  Physical Exam   GEN: Well nourished, well developed in no acute distress HEENT: Normal NECK: No  JVD; No carotid bruits LYMPHATICS: No lymphadenopathy CARDIAC:RRR, no murmurs, rubs, gallops RESPIRATORY:  Clear to auscultation without rales, wheezing or rhonchi  ABDOMEN: Soft, non-tender, non-distended MUSCULOSKELETAL:  No edema; No deformity  SKIN: Warm and dry NEUROLOGIC:  Alert and oriented x 3 PSYCHIATRIC:  Normal affect   Labs    High Sensitivity Troponin:   Recent Labs  Lab 08/09/21 1231 08/09/21 1409 08/09/21 1658 08/09/21 2151 08/10/21 0643  TROPONINIHS 533* 580* 816* 1,183* 1,076*      Chemistry Recent Labs  Lab 08/16/21 0219 08/17/21 0311 08/18/21 0132 08/19/21 0238  NA 131* 134* 133* 133*  K 4.0 4.3 4.4 4.3  CL 97* 99 98 97*  CO2 27 28 28 27   GLUCOSE 149* 113* 142* 116*  BUN 28* 33* 33* 27*  CREATININE 1.25* 1.43* 1.34* 1.18  CALCIUM 8.4* 8.4* 8.6* 8.7*  MG 1.8  --   --   --   PROT  --  6.8  --   --   ALBUMIN  --  2.5*  --   --   AST  --  45*  --   --  ALT  --  37  --   --   ALKPHOS  --  135*  --   --   BILITOT  --  1.5*  --   --   GFRNONAA 56* 48* 52* >60  ANIONGAP 7 7 7 9      Lipids No results for input(s): CHOL, TRIG, HDL, LABVLDL, LDLCALC, CHOLHDL in the last 168 hours.  Hematology Recent Labs  Lab 08/15/21 0458 08/15/21 1338 08/16/21 0219  WBC 5.5 5.2 5.7  RBC 4.93 4.84 4.97  HGB 12.5* 11.7* 12.1*  HCT 39.8 40.1 40.6  MCV 80.7 82.9 81.7  MCH 25.4* 24.2* 24.3*  MCHC 31.4 29.2* 29.8*  RDW 19.0* 19.1* 19.2*  PLT 213 204 220    Thyroid No results for input(s): TSH, FREET4 in the last 168 hours.  BNPNo results for input(s): BNP, PROBNP in the last 168 hours.  DDimer No results for input(s): DDIMER in the last 168 hours.   Radiology    No results found.  Cardiac Studies   Echo: 08/10/21   IMPRESSIONS     1. Left ventricular ejection fraction, by estimation, is 25 to 30%. The  left ventricle has severely decreased function. The left ventricle  demonstrates global hypokinesis with severe hypokinesis of the anterior,   septal, and apical region. Inferior wall  best preserved. Left ventricular diastolic parameters are consistent with  Grade II diastolic dysfunction (pseudonormalization). The average left  ventricular global longitudinal strain is -6.3 %. The global longitudinal  strain is abnormal.   2. Right ventricular systolic function is moderately reduced. The right  ventricular size is moderately enlarged. There is mildly elevated  pulmonary artery systolic pressure. The estimated right ventricular  systolic pressure is 29.5 mmHg.   3. Left atrial size was moderately dilated.   4. The mitral valve is normal in structure. Moderate to severe mitral  valve regurgitation.   5. Tricuspid valve regurgitation is mild to moderate.   6. Aortic dilatation noted. There is mild dilatation of the aortic root,  measuring 39 mm.   7. The inferior vena cava is dilated in size with <50% respiratory  variability, suggesting right atrial pressure of 15 mmHg.   FINDINGS   Left Ventricle: Left ventricular ejection fraction, by estimation, is 25  to 30%. The left ventricle has severely decreased function. The left  ventricle demonstrates global hypokinesis. The average left ventricular  global longitudinal strain is -6.3 %.  The global longitudinal strain is abnormal. The left ventricular internal  cavity size was normal in size. There is no left ventricular hypertrophy.  Left ventricular diastolic parameters are consistent with Grade II  diastolic dysfunction  (pseudonormalization).   Right Ventricle: The right ventricular size is moderately enlarged. No  increase in right ventricular wall thickness. Right ventricular systolic  function is moderately reduced. There is mildly elevated pulmonary artery  systolic pressure. The tricuspid  regurgitant velocity is 2.58 m/s, and with an assumed right atrial  pressure of 15 mmHg, the estimated right ventricular systolic pressure is  18.8 mmHg.   Left Atrium: Left  atrial size was moderately dilated.   Right Atrium: Right atrial size was normal in size.   Pericardium: There is no evidence of pericardial effusion.   Mitral Valve: The mitral valve is normal in structure. Moderate to severe  mitral valve regurgitation, with eccentric posteriorly directed jet. No  evidence of mitral valve stenosis.   Tricuspid Valve: The tricuspid valve is normal in structure. Tricuspid  valve regurgitation is mild  to moderate. No evidence of tricuspid  stenosis.   Aortic Valve: The aortic valve was not well visualized. Aortic valve  regurgitation is not visualized. Mild aortic valve sclerosis is present,  with no evidence of aortic valve stenosis. Aortic valve mean gradient  measures 4.0 mmHg. Aortic valve peak  gradient measures 6.9 mmHg. Aortic valve area, by VTI measures 2.57 cm.   Pulmonic Valve: The pulmonic valve was normal in structure. Pulmonic valve  regurgitation is mild to moderate. No evidence of pulmonic stenosis.   Aorta: Aortic dilatation noted. There is mild dilatation of the aortic  root, measuring 39 mm.   Venous: The pulmonary veins were not well visualized. The inferior vena  cava is dilated in size with less than 50% respiratory variability,  suggesting right atrial pressure of 15 mmHg.   IAS/Shunts: No atrial level shunt detected by color flow Doppler.    Cath: 08/14/21   Conclusions: Severe multivessel coronary artery disease, including 80-90% ostial LAD stenosis and chronic total occlusion of the proximal RCA, a seen on prior catheterization in 11/2020. Moderately elevated left heart, right heart, and pulmonary artery pressures. Moderately-severely reduced cardiac output/index. Ectatic abdominal aorta common/external iliac arteries, and common femoral arteries without significant disease.  Focal 70% right internal iliac artery stenosis is noted. 50% ostial stenosis of right renal artery.   Recommendations: Transfer to [facility]  for consideration of high risk PCI to LMCA/ostial LAD.  Given heavy calcification and reduced LVEF, atherectomy and Impella support will need to be considered.  I will load the patient with clopidogrel 600 mg today, followed by 75 mg daily thereafter. Maintain net even to slightly negative fluid balance following contrast exposure today and chronic kidney disease. Escalate goal-directed medical therapy of acute on chronic HFrEF, as tolerated. Aggressive secondary prevention of coronary artery disease.   Nelva Bush, MD   Diagnostic Dominance: Right Cath: 08/15/21     Mid LM to Dist LM lesion is 30% stenosed.   Ost LAD lesion is 85% stenosed.   Prox LAD lesion is 50% stenosed.   Dist LAD lesion is 35% stenosed.   Prox Cx lesion is 50% stenosed.   Prox RCA to Dist RCA lesion is 100% stenosed.   1st Diag lesion is 80% stenosed.   1st Mrg lesion is 70% stenosed.   A drug-eluting stent was successfully placed using a STENT ONYX FRONTIER 2.75X26.   Post intervention, there is a 0% residual stenosis.   Post intervention, there is a 0% residual stenosis.   Post intervention, there is a 0% residual stenosis.   Successful Impella supported high risk PCI of the ostial LAD with atherectomy and drug-eluting stent placement.  The stent extended 1 to 2 mm into the left main coronary artery to ensure coverage of the ostium.  There was normal flow into the  left circumflex. Moderately elevated left ventricular end-diastolic pressure at 24 mmHg at the beginning of the case.   Recommendations: Dual antiplatelet therapy for at least 12 months and preferably longer. Aggressive treatment of risk factors. Optimize heart failure therapy.  Monitor renal function closely.  135 mL of contrast was used.   Diagnostic Dominance: Right Intervention     Patient Profile     87 y.o. male with a history of CAD, ICM, HFrEF, HTN, HL, DMII, GERD, CVA, PAD, and carotid arterial dzs, who presented October 19 due  to progressive dyspnea, increasing abdominal girth, lower extremity edema.  Elevated troponin to 1183.  EF 25 to 30% with  global hypokinesis and severe anterior, septal, and apical hypokinesis on echo this admission.  Assessment & Plan    NSTEMI/Known Multivessel CAD: H/o of multivessel CAD previously seen by CT surgery and deemed not a good surgical candidate. Presented to [facility] 10/19 with CHF. BNP and troponin elevated. EKG showed septal infarct with inferior T wave changes. Echo showed worsening EF 20-25% (prior was 35-40% 11/2020). He eventually underwent repeat heart cath showing unchanged anatomy from heart cath 11/2020 (details of cath above).  He was transferred to [facility] for high risk PCI.  Underwent successful Impella supported high risk PCI of ostial LAD with atherectomy and DES x1.  Stent did extend 1 to 2 mm into the left main coronary artery to ensure coverage of the ostium.  No complications noted.  Recommendations for DAPT with aspirin/Plavix for at least 1 year preferably longer.  Cardiac rehab following -denies any further CP or SOB -continue on aspirin 81 mg daily, Plavix 75 mg daily, high-dose statin and beta-blocker    Acute on chronic HFrEF/ICM: EF 35-40% by echo 11/2020. Echo this admission showed decline in EF 20-25% with global HK and more severe HK of the anterior, septal and apical regions.  -- GDMT is limited in the setting of soft blood pressures.   -- Continue Toprol-XL 50 mg daily, spironolactone 12.5 mg daily and Lasix 20 mg daily -- No ARB, Entresto at this time due to AKI - felt to not likely tolerate aggressive heart failure medical therapy in the setting of his advanced age and mild chronic kidney disease --he put out 900cc yesterday and is net neg 2.9L --He does not appear volume overloaded on exam today --Serum creatinine improved to 1.18 today with K+ 4.3 --try to wean off oxygen today  Hypoxia:  -- O2 has been titrated down to 2 L with O2 sats 98% -- needs  to work with CR and PT  -- incentive spiro ordered    HLD:  LDL 29 07/2021 -- continue atorvastatin 40 mg daily   DM: A1C 6.7 -- SSI during admission -- allergy to Jardiance   PAD: continue ASA and statin -- lower anatomy reviewed on recent cath   CKD stage 3: monitor with diuresis and post-cath -- Serum creatinine improved from 1.43 to 1.18 today --Renal function stable after starting spironolactone 12.5 mg daily and Lasix 20 mg daily  -- daily BMET   Bigeminy: improved, K+ 4.3 and mag 1.8 today -- continue BB therapy  Moderate to severe MR: on echo 10/21.  --With his ongoing hypoxia, not a good candidate currently for consideration of TEE.   Paroxysmal atrial tachycardia -no further episodes on tele -Continue Toprol-XL 50 mg daily  I have spent a total of 30 minutes with patient reviewing hospital notes , telemetry, EKGs, labs and examining patient as well as establishing an assessment and plan that was discussed with the patient.  > 50% of time was spent in direct patient care.     For questions or updates, please contact [place] Please consult [email or web address] for contact info under     Signed, Fransico Him, MD  08/19/2021, 8:04 AM    08/19/2021 8:04 AM

## 2021-08-20 ENCOUNTER — Ambulatory Visit: Payer: Medicare Other | Admitting: Family

## 2021-08-20 ENCOUNTER — Other Ambulatory Visit: Payer: Self-pay

## 2021-08-20 ENCOUNTER — Encounter (HOSPITAL_COMMUNITY): Payer: Self-pay | Admitting: Cardiovascular Disease

## 2021-08-20 ENCOUNTER — Inpatient Hospital Stay (HOSPITAL_COMMUNITY)
Admission: AD | Admit: 2021-08-20 | Discharge: 2021-08-20 | Disposition: A | Discharge status: Still In Hospital | Payer: Medicare Other | Source: Other Acute Inpatient Hospital | Attending: Cardiology | Admitting: Cardiology

## 2021-08-20 ENCOUNTER — Telehealth (HOSPITAL_COMMUNITY): Payer: Self-pay | Admitting: Pharmacist

## 2021-08-20 DIAGNOSIS — I429 Cardiomyopathy, unspecified: Secondary | ICD-10-CM

## 2021-08-20 DIAGNOSIS — I214 Non-ST elevation (NSTEMI) myocardial infarction: Secondary | ICD-10-CM

## 2021-08-20 DIAGNOSIS — E782 Mixed hyperlipidemia: Secondary | ICD-10-CM

## 2021-08-20 DIAGNOSIS — I10 Essential (primary) hypertension: Secondary | ICD-10-CM | POA: Diagnosis not present

## 2021-08-20 DIAGNOSIS — I5023 Acute on chronic systolic (congestive) heart failure: Secondary | ICD-10-CM | POA: Diagnosis not present

## 2021-08-20 LAB — BASIC METABOLIC PANEL
Anion gap: 8 (ref 5–15)
BUN: 21 mg/dL (ref 8–23)
CO2: 25 mmol/L (ref 22–32)
Calcium: 8.7 mg/dL — ABNORMAL LOW (ref 8.9–10.3)
Chloride: 100 mmol/L (ref 98–111)
Creatinine, Ser: 1.07 mg/dL (ref 0.61–1.24)
GFR, Estimated: >60 mL/min (ref >60)
Glucose, Bld: 144 mg/dL — ABNORMAL HIGH (ref 70–99)
Potassium: 4.6 mmol/L (ref 3.5–5.1)
Sodium: 133 mmol/L — ABNORMAL LOW (ref 135–145)

## 2021-08-20 LAB — GLUCOSE, CAPILLARY
Glucose-Capillary: 119 mg/dL — ABNORMAL HIGH (ref 70–99)
Glucose-Capillary: 178 mg/dL — ABNORMAL HIGH (ref 70–99)

## 2021-08-20 MED ORDER — FUROSEMIDE 20 MG PO TABS: 20.0000 mg | ORAL_TABLET | Freq: Every day | ORAL | 11 refills | Status: DC

## 2021-08-20 MED ORDER — SPIRONOLACTONE 25 MG PO TABS: 12.5000 mg | ORAL_TABLET | Freq: Every day | ORAL | 11 refills | Status: DC

## 2021-08-20 MED ORDER — CLOPIDOGREL BISULFATE 75 MG PO TABS
75.0000 mg | ORAL_TABLET | Freq: Every day | ORAL | Status: DC
  Administered 2021-08-20: 75 mg via ORAL
  Filled 2021-08-20: qty 1

## 2021-08-20 MED ORDER — COVID-19MRNA BIVAL VAC MODERNA 50 MCG/0.5ML IM SUSP
0.5000 mL | Freq: Once | INTRAMUSCULAR | Status: AC
  Administered 2021-08-20: 0.5 mL via INTRAMUSCULAR
  Filled 2021-08-20: qty 0.5

## 2021-08-20 MED ORDER — METOPROLOL SUCCINATE ER 50 MG PO TB24: 50.0000 mg | ORAL_TABLET | Freq: Every day | ORAL | 11 refills | Status: DC

## 2021-08-20 NOTE — Discharge Summary (Addendum)
The patient has been seen in conjunction with Vin Bhagat, {AC. All aspects of care have been considered and discussed. The patient has been personally interviewed, examined, and all clinical data has been reviewed.  He needs nocturnal O2 when sleeping due to desaturation < 89% Long term DAPT > 1 year with eventual monotherapy when appropriate. Ready for DC.  Discharge Summary    Patient ID: Henry Lindsey MRN: [id number]; DOB: 1933-02-21  Admit date: 08/15/2021 Discharge date: 08/20/2021  PCP:  Baxter Hire, MD   [facility] Providers Cardiologist:  Ida Rogue, MD   {  Discharge Diagnoses    Principal Problem:   Non-ST elevation (NSTEMI) myocardial infarction [facility]) Active Problems:   Hyperlipidemia   Hypertension   Peripheral vascular disease in diabetes mellitus ([place])   Unstable angina ([place])   Acute on chronic systolic (congestive) heart failure (HCC)   Acute hypoxic respiratory failure   DM   CKD stage 3   Moderate to severe MR    Paroxysmal atrial tachycardia   Diagnostic Studies/Procedures    Coronary Stent Intervention w/Impella 08/15/21  CORONARY ATHERECTOMY   Conclusion      Mid LM to Dist LM lesion is 30% stenosed.   Ost LAD lesion is 85% stenosed.   Prox LAD lesion is 50% stenosed.   Dist LAD lesion is 35% stenosed.   Prox Cx lesion is 50% stenosed.   Prox RCA to Dist RCA lesion is 100% stenosed.   1st Diag lesion is 80% stenosed.   1st Mrg lesion is 70% stenosed.   A drug-eluting stent was successfully placed using a STENT ONYX FRONTIER 2.75X26.   Post intervention, there is a 0% residual stenosis.   Post intervention, there is a 0% residual stenosis.   Post intervention, there is a 0% residual stenosis.   Successful Impella supported high risk PCI of the ostial LAD with atherectomy and drug-eluting stent placement.  The stent extended 1 to 2 mm into the left main coronary artery to ensure coverage of the ostium.  There was normal flow  into the  left circumflex. Moderately elevated left ventricular end-diastolic pressure at 24 mmHg at the beginning of the case.   Recommendations: Dual antiplatelet therapy for at least 12 months and preferably longer. Aggressive treatment of risk factors. Optimize heart failure therapy.  Monitor renal function closely.  135 mL of contrast was used.  Diagnostic Dominance: Right Intervention   Cardiac cath 08/14/21 PV cath Conclusions: Severe multivessel coronary artery disease, including 80-90% ostial LAD stenosis and chronic total occlusion of the proximal RCA, a seen on prior catheterization in 11/2020. Moderately elevated left heart, right heart, and pulmonary artery pressures. Moderately-severely reduced cardiac output/index. Ectatic abdominal aorta common/external iliac arteries, and common femoral arteries without significant disease.  Focal 70% right internal iliac artery stenosis is noted. 50% ostial stenosis of right renal artery.   Recommendations: Transfer to [facility] for consideration of high risk PCI to LMCA/ostial LAD.  Given heavy calcification and reduced LVEF, atherectomy and Impella support will need to be considered.  I will load the patient with clopidogrel 600 mg today, followed by 75 mg daily thereafter. Maintain net even to slightly negative fluid balance following contrast exposure today and chronic kidney disease. Escalate goal-directed medical therapy of acute on chronic HFrEF, as tolerated. Aggressive secondary prevention of coronary artery disease.   Nelva Bush, MD [facility]   Coronary Diagrams   Diagnostic Dominance: Right Intervention  Echocardiogram 08/10/2021  1. Left ventricular ejection fraction, by estimation, is 25 to 30%. The  left ventricle has severely decreased function. The left ventricle  demonstrates global hypokinesis with severe hypokinesis of the anterior,  septal, and apical region. Inferior wall  best  preserved. Left ventricular diastolic parameters are consistent with  Grade II diastolic dysfunction (pseudonormalization). The average left  ventricular global longitudinal strain is -6.3 %. The global longitudinal  strain is abnormal.   2. Right ventricular systolic function is moderately reduced. The right  ventricular size is moderately enlarged. There is mildly elevated  pulmonary artery systolic pressure. The estimated right ventricular  systolic pressure is 92.1 mmHg.   3. Left atrial size was moderately dilated.   4. The mitral valve is normal in structure. Moderate to severe mitral  valve regurgitation.   5. Tricuspid valve regurgitation is mild to moderate.   6. Aortic dilatation noted. There is mild dilatation of the aortic root,  measuring 39 mm.   7. The inferior vena cava is dilated in size with <50% respiratory  variability, suggesting right atrial pressure of 15 mmHg.      History of Present Illness     Henry Lindsey is a 87 y.o. male with a history of CAD, ICM, HFrEF, HTN, HL, DMII, GERD, CVA, PAD, and carotid arterial dzs, who presented October 19 at [facility] due to progressive dyspnea, increasing abdominal girth, lower extremity edema and transferred to [facility] for high risk PCI.   Hx of respiratory failure from bronchitis and PNA in 09/2020. Echo at that time showed LV dysfunction w/ EF 35% nl RVSP, mod MR, mild TR, mild pericardial effusion. He was placed on lasix and referred to cardiology. Cath in 11/2020 showed severe diffuse LAD, diag, and OM dzs with an occluded RCA. CO/CL. EF 35%. He was evaluated by CT surgery and felt to be a poor surgical candidate and has been medically managed since.  He reported he was taking Pletal and was advised to d/c this. H/o of rash on SGLT2i.   The patient presented to [facility] ED 10/19 for increasing DOE, orthopnea, early satiety, and lower extremity edema. Reproted dry weight of 189lbs. He noted he was becoming volume overloaded and started  taking an extra lasix with no improvement. Due to no improvement EMS was called 10/19. O2 sat was 80% on arrival improved to 90% on 4L. He was tachycardic with EKG showing ST @ 112 w/ septal infarct and mild ST depression. Labs showed BNP>4500 and HS trop 533. BUN/Cr mildly elevated above baseline. CXR with small R and trace L pleural effusion. He was given IV lasix, started on IV heparin and admitted. GDMT limited due to hypotension and CKD. Echo showed LVEF 20-25%.   R/L heart cath 10/25 was unchanged showing Severe multivessel CAD, including 80-90% ostial LAD stenosis and CTO pRCA as seen on prior, moderately elevated left heart, right heart, and pulmonary artery pressures, moderately-severely reduced cardiac output/index, ectatic abdominal aorta/external iliac arteries, and common femoral arteries without significant disease, 70% right internal iliac artery stenosis, 50% ostial stenosis or right renal artery. Transfered to [facility] for high risk PCI to LMCA/ostial LAD.   Hospital Course     Consultants: None   NSTEMI/Known Multivessel CAD:  Presented to [facility] 10/19 with CHF. BNP and troponin elevated. EKG showed septal infarct with inferior T wave changes. Echo showed worsening EF 20-25% (prior was 35-40% 11/2020). He eventually underwent repeat heart cath showing unchanged anatomy from heart cath 11/2020 (details of  cath above).  He was transferred to [facility] for high risk PCI.  Underwent successful Impella supported high risk PCI of ostial LAD with atherectomy and DES x1.  Stent did extend 1 to 2 mm into the left main coronary artery to ensure coverage of the ostium.  No complications noted.  Recommendations for DAPT with aspirin/Plavix for at least 1 year preferably longer.  Ambulated well without CP or SOB. -- Continue on aspirin 81 mg daily, Plavix 75 mg daily, high-dose statin and beta-blocker    Acute on chronic HFrEF/ICM: EF 35-40% by echo 11/2020. Echo this admission showed decline in EF 20-25% with  global HK and more severe HK of the anterior, septal and apical regions.  -- GDMT is limited in the setting of soft blood pressures.   -- Continue Toprol-XL 50 mg daily, spironolactone 12.5 mg daily and Lasix 20 mg daily -- No ARB, Entresto at this time due to AKI - felt to not likely tolerate aggressive heart failure medical therapy in the setting of his advanced age and mild chronic kidney disease.  However renal function has been normalized. Can be re-challenge in outpatient setting.  - Allergy to Jardiance - Net I & O -4.2L. No daily weight.  - Had intermittent hypoxia. Oxygen saturation > 92% on room air with ambulation but had nocturnal hypoxia>> will arrange oxygen.  -- Will set up follow up in Heart Failure TOC clinic   Acute Hypoxic respiratory failure: -- Intermittent hypoxia, as above  -- needs to work with CR and PT  -- incentive spiro    HLD:   --08/10/2021: Cholesterol 53; HDL 13; LDL Cholesterol 29; Triglycerides 54; VLDL 11  -- continue atorvastatin 40 mg daily   DM:  -- A1C 6.7 -- SSI during admission -- allergy to Jardiance   PAD:  -- continue ASA and statin -- lower anatomy reviewed on recent cath   CKD stage 3: -- Serum creatinine peaked at 1.43, now normalized at 1.07 --Renal function stable after starting spironolactone 12.5 mg daily and Lasix 20 mg daily    Bigeminy:  -- improved -- Electrolytes stale  -- continue BB therapy   Moderate to severe MR:  --With his ongoing hypoxia, not a good candidate currently for consideration of TEE.  - Echo 07/2021 with moderate to severe mitral valve regurgitation.   Paroxysmal atrial tachycardia -no further episodes on tele -Continue Toprol-XL 50 mg daily  Did the patient have an acute coronary syndrome (MI, NSTEMI, STEMI, etc) this admission?:  Yes                               AHA/ACC Clinical Performance & Quality Measures: Aspirin prescribed? - Yes ADP Receptor Inhibitor (Plavix/Clopidogrel,  Brilinta/Ticagrelor or Effient/Prasugrel) prescribed (includes medically managed patients)? - Yes Beta Blocker prescribed? - Yes High Intensity Statin (Lipitor 40-105m or Crestor 20-459m prescribed? - Yes EF assessed during THIS hospitalization? - Yes For EF <40%, was ACEI/ARB prescribed? - No - Reason:  due to AKI For EF <40%, Aldosterone Antagonist (Spironolactone or Eplerenone) prescribed? - Yes Cardiac Rehab Phase II ordered (including medically managed patients)? - Yes     The patient will be scheduled for a TOC follow up appointment in 7 days.  [facility] at the office where the patient should be seen for follow up.    Discharge Vitals Blood pressure 119/74, pulse 84, temperature (!)  97.4 F (36.3 C), temperature source Oral, resp. rate 18, height '5\' 11"'  (1.803 m), SpO2 100 %.  There were no vitals filed for this visit.  Labs & Radiologic Studies    Basic Metabolic Panel Recent Labs    08/19/21 0238 08/20/21 0148  NA 133* 133*  K 4.3 4.6  CL 97* 100  CO2 27 25  GLUCOSE 116* 144*  BUN 27* 21  CREATININE 1.18 1.07  CALCIUM 8.7* 8.7*   High Sensitivity Troponin:   Recent Labs  Lab 08/09/21 1231 08/09/21 1409 08/09/21 1658 08/09/21 2151 08/10/21 0643  TROPONINIHS 533* 580* 816* 1,183* 1,076*    _____________  DG Chest 2 View  Result Date: 08/16/2021 CLINICAL DATA:  Difficulty breathing EXAM: CHEST - 2 VIEW COMPARISON:  Previous studies including the examination of 08/09/2021 FINDINGS: Transverse diameter of heart is increased. Central pulmonary vessels are prominent without signs of alveolar pulmonary edema. There is blunting of both posterior costophrenic angles suggesting bilateral pleural effusions, more so on the right side. There is interval worsening of subsegmental atelectasis in right parahilar region. There is no pneumothorax. IMPRESSION: Cardiomegaly. Central pulmonary vessels are prominent without signs of  pulmonary edema. Bilateral pleural effusions, more so on the right side. Linear densities in right parahilar region suggests more prominent suggesting worsening subsegmental atelectasis. Reading location: [place], [place]. Electronically Signed   By: Elmer Picker M.D.   On: 08/16/2021 13:18   DG Chest 2 View  Result Date: 08/09/2021 CLINICAL DATA:  Shortness of breath. EXAM: CHEST - 2 VIEW COMPARISON:  Chest x-ray dated December 05, 2020. FINDINGS: Stable cardiomegaly. Normal pulmonary vascularity. Increasing small right and unchanged trace left pleural effusions. Increased atelectasis at the right lung base. No pneumothorax. No acute osseous abnormality. IMPRESSION: 1. Increasing small right and unchanged trace left pleural effusions. Electronically Signed   By: Titus Dubin M.D.   On: 08/09/2021 13:38   CARDIAC CATHETERIZATION  Result Date: 08/15/2021   Mid LM to Dist LM lesion is 30% stenosed.   Ost LAD lesion is 85% stenosed.   Prox LAD lesion is 50% stenosed.   Dist LAD lesion is 35% stenosed.   Prox Cx lesion is 50% stenosed.   Prox RCA to Dist RCA lesion is 100% stenosed.   1st Diag lesion is 80% stenosed.   1st Mrg lesion is 70% stenosed.   A drug-eluting stent was successfully placed using a STENT ONYX FRONTIER 2.75X26.   Post intervention, there is a 0% residual stenosis.   Post intervention, there is a 0% residual stenosis.   Post intervention, there is a 0% residual stenosis. Successful Impella supported high risk PCI of the ostial LAD with atherectomy and drug-eluting stent placement.  The stent extended 1 to 2 mm into the left main coronary artery to ensure coverage of the ostium.  There was normal flow into the  left circumflex. Moderately elevated left ventricular end-diastolic pressure at 24 mmHg at the beginning of the case. Recommendations: Dual antiplatelet therapy for at least 12 months and preferably longer. Aggressive treatment of risk factors. Optimize heart failure  therapy.  Monitor renal function closely.  135 mL of contrast was used.  CARDIAC CATHETERIZATION  Result Date: 08/14/2021 Conclusions: Severe multivessel coronary artery disease, including 80-90% ostial LAD stenosis and chronic total occlusion of the proximal RCA, a seen on prior catheterization in 11/2020. Moderately elevated left heart, right heart, and pulmonary artery pressures. Moderately-severely reduced cardiac output/index. Ectatic abdominal aorta common/external iliac arteries, and common femoral arteries  without significant disease.  Focal 70% right internal iliac artery stenosis is noted. 50% ostial stenosis of right renal artery. Recommendations: Transfer to [facility] for consideration of high risk PCI to LMCA/ostial LAD.  Given heavy calcification and reduced LVEF, atherectomy and Impella support will need to be considered.  I will load the patient with clopidogrel 600 mg today, followed by 75 mg daily thereafter. Maintain net even to slightly negative fluid balance following contrast exposure today and chronic kidney disease. Escalate goal-directed medical therapy of acute on chronic HFrEF, as tolerated. Aggressive secondary prevention of coronary artery disease. Nelva Bush, MD [facility]  PERIPHERAL VASCULAR CATHETERIZATION  Result Date: 08/14/2021 Conclusions: Severe multivessel coronary artery disease, including 80-90% ostial LAD stenosis and chronic total occlusion of the proximal RCA, a seen on prior catheterization in 11/2020. Moderately elevated left heart, right heart, and pulmonary artery pressures. Moderately-severely reduced cardiac output/index. Ectatic abdominal aorta common/external iliac arteries, and common femoral arteries without significant disease.  Focal 70% right internal iliac artery stenosis is noted. 50% ostial stenosis of right renal artery. Recommendations: Transfer to [facility] for consideration of high risk PCI to LMCA/ostial LAD.  Given heavy  calcification and reduced LVEF, atherectomy and Impella support will need to be considered.  I will load the patient with clopidogrel 600 mg today, followed by 75 mg daily thereafter. Maintain net even to slightly negative fluid balance following contrast exposure today and chronic kidney disease. Escalate goal-directed medical therapy of acute on chronic HFrEF, as tolerated. Aggressive secondary prevention of coronary artery disease. Nelva Bush, MD [facility]  US Venous Img Lower Bilateral (DVT)  Result Date: 08/09/2021 CLINICAL DATA:  Bilateral lower extremity edema. History of previous right lower extremity DVT. Evaluate for acute or chronic DVT. EXAM: BILATERAL LOWER EXTREMITY VENOUS DOPPLER ULTRASOUND TECHNIQUE: Gray-scale sonography with graded compression, as well as color Doppler and duplex ultrasound were performed to evaluate the lower extremity deep venous systems from the level of the common femoral vein and including the common femoral, femoral, profunda femoral, popliteal and calf veins including the posterior tibial, peroneal and gastrocnemius veins when visible. The superficial great saphenous vein was also interrogated. Spectral Doppler was utilized to evaluate flow at rest and with distal augmentation maneuvers in the common femoral, femoral and popliteal veins. COMPARISON:  None. FINDINGS: RIGHT LOWER EXTREMITY Common Femoral Vein: No evidence of thrombus. Normal compressibility, respiratory phasicity and response to augmentation. Saphenofemoral Junction: No evidence of thrombus. Normal compressibility and flow on color Doppler imaging. Profunda Femoral Vein: No evidence of thrombus. Normal compressibility and flow on color Doppler imaging. Femoral Vein: No evidence of thrombus. Normal compressibility, respiratory phasicity and response to augmentation. Popliteal Vein: No evidence of thrombus. Normal compressibility, respiratory phasicity and response to augmentation. Calf Veins: No  evidence of thrombus. Normal compressibility and flow on color Doppler imaging. Superficial Great Saphenous Vein: No evidence of thrombus. Normal compressibility. Venous Reflux:  None. Other Findings: Pulsatile venous flow is demonstrated throughout near the entirety of the right lower extremity venous system. LEFT LOWER EXTREMITY Common Femoral Vein: No evidence of thrombus. Normal compressibility, respiratory phasicity and response to augmentation. Saphenofemoral Junction: No evidence of thrombus. Normal compressibility and flow on color Doppler imaging. Profunda Femoral Vein: No evidence of thrombus. Normal compressibility and flow on color Doppler imaging. Femoral Vein: No evidence of thrombus. Normal compressibility, respiratory phasicity and response to augmentation. Popliteal Vein: No evidence of thrombus. Normal compressibility, respiratory phasicity and response to augmentation. Calf Veins: No evidence of thrombus.  Normal compressibility and flow on color Doppler imaging. Superficial Great Saphenous Vein: No evidence of thrombus. Normal compressibility. Venous Reflux:  None. Other Findings: Pulsatile flow is demonstrated throughout near the entirety of the left lower extremity venous system. IMPRESSION: 1. No evidence of acute or chronic DVT within either lower extremity. 2. Pulsatile venous flow demonstrated throughout the bilateral lower extremity venous systems, nonspecific though could be seen in the setting of right-sided heart failure - clinical correlation is advised. Electronically Signed   By: Sandi Mariscal M.D.   On: 08/09/2021 17:15   ECHOCARDIOGRAM COMPLETE  Result Date: 08/10/2021    ECHOCARDIOGRAM REPORT   Patient Name:   ARCHIE SHEA Date of Exam: 08/10/2021 Medical Rec #:  [id number]     Height:       71.0 in Accession #:    [id number]    Weight:       188.0 lb Date of Birth:  03/01/1933     BSA:          2.054 m Patient Age:    12 years      BP:           102/64 mmHg Patient Gender: M              HR:           89 bpm. Exam Location:  [facility] Procedure: 2D Echo, Color Doppler, Cardiac Doppler and Strain Analysis Indications:     I50.21 congestive heart failure-Acute Systolic  History:         Patient has no prior history of Echocardiogram examinations.                  HFrEF, CAD, COPD, Stroke and PVD; Risk Factors:Diabetes,                  Hypertension and Dyslipidemia.  Sonographer:     Charmayne Sheer Referring Phys:  [place] Diagnosing Phys: Ida Rogue MD  Sonographer Comments: Global longitudinal strain was attempted. IMPRESSIONS  1. Left ventricular ejection fraction, by estimation, is 25 to 30%. The left ventricle has severely decreased function. The left ventricle demonstrates global hypokinesis with severe hypokinesis of the anterior, septal, and apical region. Inferior wall best preserved. Left ventricular diastolic parameters are consistent with Grade II diastolic dysfunction (pseudonormalization). The average left ventricular global longitudinal strain is -6.3 %. The global longitudinal strain is abnormal.  2. Right ventricular systolic function is moderately reduced. The right ventricular size is moderately enlarged. There is mildly elevated pulmonary artery systolic pressure. The estimated right ventricular systolic pressure is 27.2 mmHg.  3. Left atrial size was moderately dilated.  4. The mitral valve is normal in structure. Moderate to severe mitral valve regurgitation.  5. Tricuspid valve regurgitation is mild to moderate.  6. Aortic dilatation noted. There is mild dilatation of the aortic root, measuring 39 mm.  7. The inferior vena cava is dilated in size with <50% respiratory variability, suggesting right atrial pressure of 15 mmHg. FINDINGS  Left Ventricle: Left ventricular ejection fraction, by estimation, is 25 to 30%. The left ventricle has severely decreased function. The left ventricle demonstrates global hypokinesis. The average left ventricular global  longitudinal strain is -6.3 %. The global longitudinal strain is abnormal. The left ventricular internal cavity size was normal in size. There is no left ventricular hypertrophy. Left ventricular diastolic parameters are consistent with Grade II diastolic dysfunction (pseudonormalization). Right Ventricle: The right ventricular size is moderately enlarged. No increase  in right ventricular wall thickness. Right ventricular systolic function is moderately reduced. There is mildly elevated pulmonary artery systolic pressure. The tricuspid regurgitant velocity is 2.58 m/s, and with an assumed right atrial pressure of 15 mmHg, the estimated right ventricular systolic pressure is 75.9 mmHg. Left Atrium: Left atrial size was moderately dilated. Right Atrium: Right atrial size was normal in size. Pericardium: There is no evidence of pericardial effusion. Mitral Valve: The mitral valve is normal in structure. Moderate to severe mitral valve regurgitation, with eccentric posteriorly directed jet. No evidence of mitral valve stenosis. Tricuspid Valve: The tricuspid valve is normal in structure. Tricuspid valve regurgitation is mild to moderate. No evidence of tricuspid stenosis. Aortic Valve: The aortic valve was not well visualized. Aortic valve regurgitation is not visualized. Mild aortic valve sclerosis is present, with no evidence of aortic valve stenosis. Aortic valve mean gradient measures 4.0 mmHg. Aortic valve peak gradient measures 6.9 mmHg. Aortic valve area, by VTI measures 2.57 cm. Pulmonic Valve: The pulmonic valve was normal in structure. Pulmonic valve regurgitation is mild to moderate. No evidence of pulmonic stenosis. Aorta: Aortic dilatation noted. There is mild dilatation of the aortic root, measuring 39 mm. Venous: The pulmonary veins were not well visualized. The inferior vena cava is dilated in size with less than 50% respiratory variability, suggesting right atrial pressure of 15 mmHg. IAS/Shunts: No  atrial level shunt detected by color flow Doppler.  LEFT VENTRICLE PLAX 2D LVIDd:         5.50 cm      Diastology LVIDs:         4.30 cm      LV e' medial:    4.24 cm/s LV PW:         1.20 cm      LV E/e' medial:  25.1 LV IVS:        0.90 cm      LV e' lateral:   6.64 cm/s LVOT diam:     2.40 cm      LV E/e' lateral: 16.0 LV SV:         63 LV SV Index:   31           2D Longitudinal Strain LVOT Area:     4.52 cm     2D Strain GLS Avg:     -6.3 %  LV Volumes (MOD) LV vol d, MOD A4C: 227.0 ml LV vol s, MOD A4C: 149.0 ml LV SV MOD A4C:     227.0 ml RIGHT VENTRICLE RV Basal diam:  4.60 cm RV S prime:     7.51 cm/s LEFT ATRIUM             Index        RIGHT ATRIUM           Index LA diam:        5.00 cm 2.43 cm/m   RA Area:     22.80 cm LA Vol (A2C):   67.9 ml 33.06 ml/m  RA Volume:   69.90 ml  34.03 ml/m LA Vol (A4C):   98.7 ml 48.06 ml/m LA Biplane Vol: 90.3 ml 43.97 ml/m  AORTIC VALVE                    PULMONIC VALVE AV Area (Vmax):    2.79 cm     PV Vmax:          0.91 m/s AV Area (Vmean):   2.64 cm  PV Vmean:         69.500 cm/s AV Area (VTI):     2.57 cm     PV VTI:           0.209 m AV Vmax:           131.00 cm/s  PV Peak grad:     3.3 mmHg AV Vmean:          95.300 cm/s  PV Mean grad:     2.0 mmHg AV VTI:            0.246 m      PR End Diast Vel: 5.66 msec AV Peak Grad:      6.9 mmHg AV Mean Grad:      4.0 mmHg LVOT Vmax:         80.90 cm/s LVOT Vmean:        55.600 cm/s LVOT VTI:          0.140 m LVOT/AV VTI ratio: 0.57  AORTA Ao Root diam: 3.90 cm MITRAL VALVE                TRICUSPID VALVE MV Area (PHT): 6.14 cm     TR Peak grad:   26.6 mmHg MV Decel Time: 124 msec     TR Vmax:        258.00 cm/s MV E velocity: 106.25 cm/s MV A velocity: 90.00 cm/s   SHUNTS MV E/A ratio:  1.18         Systemic VTI:  0.14 m                             Systemic Diam: 2.40 cm Ida Rogue MD Electronically signed by Ida Rogue MD Signature Date/Time: 08/10/2021/1:14:39 PM    Final    Disposition   Pt is  being discharged home today in good condition.  Follow-up Plans & Appointments     Follow-up Information     [facility] Follow [facility].   Specialty: Home Health Services Why: Registered Nurse and Physical Therapy-office to call with visit times. If you have questions please call [phone]        Kathlen Mody, Cadence H, PA-C Follow up on 08/27/2021.   Specialty: Cardiology Why: '@8' :25am for hospital follow up. Please arrive 15 minutes early Contact information: [place] [address] [phone]                Discharge Instructions     Amb Referral to Cardiac Rehabilitation   Complete by: As directed    Diagnosis:  Coronary Stents NSTEMI     After initial evaluation and assessments completed: Virtual Based Care may be provided alone or in conjunction with Phase 2 Cardiac Rehab based on patient barriers.: Yes   Diet - low sodium heart healthy   Complete by: As directed    Increase activity slowly   Complete by: As directed        Discharge Medications   Allergies as of 08/20/2021       Reactions   Jardiance [empagliflozin]    Rash all over and lips turn purple.    Metformin And Related Diarrhea   Trazodone Other (See Comments)   Extreme hallucinations/ psychosis         Medication List     STOP taking these medications    potassium chloride 10 MEQ tablet Commonly known as: KLOR-CON  TAKE these medications    albuterol 108 (90 Base) MCG/ACT inhaler Commonly known as: VENTOLIN HFA Inhale 2 puffs into the lungs every 6 (six) hours as needed.   ascorbic acid 500 MG tablet Commonly known as: VITAMIN C Take 500 mg by mouth daily.   aspirin EC 81 MG tablet Take 1 tablet (81 mg total) by mouth daily. Swallow whole.   atorvastatin 40 MG tablet Commonly known as: LIPITOR Take 1 tablet (40 mg total) by mouth daily.   cholecalciferol 25 MCG (1000 UNIT) tablet Commonly known as: VITAMIN D Take  1,000 Units by mouth daily.   cholestyramine 4 g packet Commonly known as: QUESTRAN Take 4 g by mouth 3 (three) times daily with meals.   cilostazol 100 MG tablet Commonly known as: PLETAL Take 100 mg by mouth 2 (two) times daily.   clopidogrel 75 MG tablet Commonly known as: PLAVIX Take 1 tablet (75 mg total) by mouth daily with breakfast.   diclofenac Sodium 1 % Gel Commonly known as: VOLTAREN Apply 2 g topically 2 (two) times daily as needed (pain).   furosemide 20 MG tablet Commonly known as: LASIX Take 1 tablet (20 mg total) by mouth daily. Start taking on: August 21, 2021 What changed:  medication strength how much to take additional instructions   gabapentin 400 MG capsule Commonly known as: NEURONTIN Take 400 mg by mouth 2 (two) times daily.   HYDROcodone-acetaminophen 5-325 MG tablet Commonly known as: NORCO/VICODIN Take 1 tablet by mouth every 6 (six) hours as needed.   INS SYRINGE/NEEDLE 1CC/28G 28G X 1/2" 1 ML Misc Commonly known as: B-D INSULIN SYRINGE 1CC/28G USE TO ADMINISTER INSULIN DAILY   insulin aspart protamine- aspart (70-30) 100 UNIT/ML injection Commonly known as: NOVOLOG MIX 70/30 Inject 0.15 mLs (15 Units total) into the skin 2 (two) times daily with a meal.   latanoprost 0.005 % ophthalmic solution Commonly known as: XALATAN Place 2 drops into both eyes at bedtime.   metoprolol succinate 50 MG 24 hr tablet Commonly known as: TOPROL-XL Take 1 tablet (50 mg total) by mouth daily. Take with or immediately following a meal. Start taking on: August 21, 2021 What changed:  medication strength how much to take   multivitamin with minerals Tabs tablet Take 1 tablet by mouth daily.   nitroGLYCERIN 0.4 MG SL tablet Commonly known as: NITROSTAT Place 1 tablet (0.4 mg total) under the tongue every 5 (five) minutes as needed for chest pain. MAXIMUM 3 TABLETS   ONE TOUCH ULTRA SYSTEM KIT w/Device Kit 1 kit by Does not apply route once. Use  DX code E11.59   ONE TOUCH ULTRA TEST test strip Generic drug: glucose blood USE 1 STRIP TO TEST THREE TIMES DAILY AS DIRECTED   OneTouch Delica Lancets 53I Misc Use three times daily to check blood sugar.   pantoprazole 40 MG tablet Commonly known as: PROTONIX Take 40 mg by mouth daily.   silver sulfADIAZINE 1 % cream Commonly known as: SILVADENE Apply topically.   spironolactone 25 MG tablet Commonly known as: ALDACTONE Take 0.5 tablets (12.5 mg total) by mouth daily. Start taking on: August 21, 2021   tamsulosin 0.4 MG Caps capsule Commonly known as: FLOMAX Take 0.4 mg by mouth daily after breakfast.   timolol 0.5 % ophthalmic solution Commonly known as: TIMOPTIC Place 2 drops into both eyes daily.   traZODone 50 MG tablet Commonly known as: DESYREL trazodone 50 mg tablet   vitamin B-12 500 MCG tablet Commonly known as:  CYANOCOBALAMIN Take 500 mcg by mouth daily.   vitamin E 180 MG (400 UNITS) capsule Take 400 Units by mouth daily.           Outstanding Labs/Studies   BMP at follow up   Duration of Discharge Encounter   Greater than 30 minutes including physician time.  Jarrett Soho, PA 08/20/2021, 12:37 PM

## 2021-08-20 NOTE — Progress Notes (Signed)
Physical Therapy Treatment Patient Details Name: Henry Lindsey MRN: [id number] DOB: 1933/06/17 Today's Date: 08/20/2021   History of Present Illness 87 yo male with onset of SOB and hypoxia for a week was brought to hosp to admit 10/20.  Had productive cough, now clear.  Requires 4L O2 at admit, currently on 3L O2 with no prev use of it.  Cardiology dx acute CHF, ischemic cardiomyopathy with demand ischemia and elevated troponin, noted elevated LFT's from heart failure.  Pt received heart cath 10/25, stent done 10/26.  Gait now for  home is encouraged.  PMHx: CHF, EF 35%, HTN, HLD, DM, COPD, stroke, GERD, PVD, R PTA SFA, CAD, carotid artery stenosis, CKD 3, diverticulitis,    PT Comments    Pt very agreeable to getting up with therapy, as doctor reported he could go home after therapy session. Pt reports that he is not back at his baseline but has improved greatly since he was admitted. Pt is currently mod I for bed mobility and supervision for transfers and ambulation with RW. Pt SaO2 monitored throughout ambulation in hallway and when good pleth wave form present, SaO2 >92%O2. PT continues to recommend HHPT to assist in returning pt to PLOF.     Recommendations for follow up therapy are one component of a multi-disciplinary discharge planning process, led by the attending physician.  Recommendations may be updated based on patient status, additional functional criteria and insurance authorization.  Follow Up Recommendations  Home health PT     Assistance Recommended at Discharge Frequent or constant Supervision/Assistance  Equipment Recommendations  Rolling walker (2 wheels)       Precautions / Restrictions Precautions Precautions: Fall Precaution Comments: no complaints on skin Restrictions Weight Bearing Restrictions: No     Mobility  Bed Mobility Overal bed mobility: Modified Independent             General bed mobility comments: HoB slightly elevated     Transfers Overall transfer level: Needs assistance Equipment used: Rolling walker (2 wheels) Transfers: Sit to/from Stand Sit to Stand: Supervision           General transfer comment: supervision for safety, good power up and self steadying    Ambulation/Gait Ambulation/Gait assistance: Supervision Gait Distance (Feet): 180 Feet Assistive device: Rolling walker (2 wheels) Gait Pattern/deviations: Step-through pattern;Decreased stride length;Wide base of support Gait velocity: reduced Gait velocity interpretation: <1.8 ft/sec, indicate of risk for recurrent falls General Gait Details: supervision for safety, slow, steady gait         Balance Overall balance assessment: Needs assistance Sitting-balance support: Feet supported Sitting balance-Leahy Scale: Good     Standing balance support: Bilateral upper extremity supported;During functional activity Standing balance-Leahy Scale: Fair                              Cognition Arousal/Alertness: Awake/alert Behavior During Therapy: WFL for tasks assessed/performed Overall Cognitive Status: Within Functional Limits for tasks assessed                                             General Comments General comments (skin integrity, edema, etc.): SaO2 on RA 100%O2 at rest, with ambulation pt with increased grip of RW causing poor pleth wave form, when asked to relax finger with ambulation SaO2 >92%O2 on RA  Pertinent Vitals/Pain Pain Assessment: No/denies pain Faces Pain Scale: No hurt Breathing: occasional labored breathing, short period of hyperventilation Negative Vocalization: none Facial Expression: smiling or inexpressive Body Language: relaxed    Home Living Family/patient expects to be discharged to:: Unsure Living Arrangements: Alone                          PT Goals (current goals can now be found in the care plan section) Acute Rehab PT Goals Patient Stated  Goal: reports he is ready to get home soon PT Goal Formulation: With patient/family Time For Goal Achievement: 08/31/21 Potential to Achieve Goals: Good Progress towards PT goals: Progressing toward goals    Frequency    Min 2X/week      PT Plan Current plan remains appropriate       AM-PAC PT "6 Clicks" Mobility   Outcome Measure  Help needed turning from your back to your side while in a flat bed without using bedrails?: A Little Help needed moving from lying on your back to sitting on the side of a flat bed without using bedrails?: A Little Help needed moving to and from a bed to a chair (including a wheelchair)?: A Little Help needed standing up from a chair using your arms (e.g., wheelchair or bedside chair)?: A Little Help needed to walk in hospital room?: A Little Help needed climbing 3-5 steps with a railing? : Total 6 Click Score: 16    End of Session Equipment Utilized During Treatment: Gait belt Activity Tolerance: Patient tolerated treatment well Patient left: in bed;with bed alarm set;with call bell/phone within reach (pt sitting EoB) Nurse Communication: Mobility status PT Visit Diagnosis: Unsteadiness on feet (R26.81);Muscle weakness (generalized) (M62.81)     Time: 1655-3748 PT Time Calculation (min) (ACUTE ONLY): 19 min  Charges:  $Therapeutic Exercise: 8-22 mins                     Mohmmad Saleeby B. Migdalia Dk PT, DPT Acute Rehabilitation Services Pager [phone] Office [phone]    [place] 08/20/2021, 11:58 AM

## 2021-08-20 NOTE — Progress Notes (Signed)
Per report from day shift RN, Pt had been on room air and maintaining sats in mid 90's while awake. Pt placed back on [place] 02 at 2L due to oxygen sats dropping to 86 while sleeping. Will continue to monitor. Jessie Foot, RN

## 2021-08-20 NOTE — TOC Progression Note (Addendum)
Transition of Care [facility]) - Progression Note    Patient Details  Name: Henry Lindsey MRN: [id number] Date of Birth: 1932-12-14  Transition of [facility] CM/SW Contact  Graves-Bigelow, Ocie Cornfield, RN Phone Number: 08/20/2021, 12:17 PM  Clinical Narrative:  Case Manager received notification that the patients sats were 92% with ambulation. Case Manager spoke with PA regarding DME oxygen needs and the patient will need secondary to oxygen levels dropping at night. Case Manager had discussed with patient and daughter last Friday regarding oxygen needs and family wants to use [employer]. Since sats did not drop below 88%, the provider can write in comments section-despite sats- provider feels the patient needs oxygen to discharge safely home. In the comment section of oxygen order, please write POC to be delivered. Case Manager has called [employer] to make them aware. Patient will need HH RN/PT orders and F2F. [facility] to follow for home health needs.  No further needs from Case Manager at this time.   Expected Discharge Plan: [place] Barriers to Discharge: No Barriers Identified  Expected Discharge Plan and Services Expected Discharge Plan: [place] In-house Referral: Clinical Social Work Discharge Planning Services: CM Consult Post Acute Care Choice: [place] arrangements for the past 2 months: Single Family Home Expected Discharge Date: 08/15/21               DME Arranged: N/A DME Agency: NA       HH Arranged: RN, Disease Management, PT [place] Agency: [place] ([facility]) Date HH Agency Contacted: 08/17/21 Time HH Agency Contacted: 1600 Representative spoke with at [place]: Ramond Marrow  Readmission Risk Interventions Readmission Risk Prevention Plan 08/17/2021  Transportation Screening Complete  PCP or Specialist Appt within 3-5 Days Complete  HRI or [place] Complete  Social Work Consult for [place]  Planning/Counseling [place] Not Applicable  Medication Review Press photographer) Complete  Some recent data might be hidden

## 2021-08-20 NOTE — Care Management Important Message (Signed)
Important Message  Patient Details  Name: Henry Lindsey MRN: [id number] Date of Birth: 13-Jul-1933   Medicare Important Message Given:  Yes     Shelda Altes 08/20/2021, 11:42 AM

## 2021-08-20 NOTE — Telephone Encounter (Signed)
Hello,  The Pharmacy team is conducting a discharge transitions of care quality improvement initiative. The recommendations below are for your consideration.     Henry Lindsey is a 87 y.o. male (MRN: [id number], DOB: 03-15-1933) who was recently hospitalized on 08/15/2021 for ACS. Orginally presented to [facility] on 10/20, transferred to [facility] on 10/26 for Impella assisted PCI to LAD/LM. They are anticipated to visit your clinic for post-discharge follow-up and may benefit from assistance with medication initiation and/or access.     Relevant medication access issues which may benefit from further intervention include: n/a   Please consider the following therapy recommendations at follow-up appointment below:   HFrEF - EF down further this admission. Blood pressure limited additional GDMT. Will add spironolactone prior to discharge. Patient would likely benefit from trial of low dose losartan if blood pressure allows at follow up. Patient previously had a rash with Jardiance and it is doubtful he would tolerate entresto.    Other relevant medication issues from their recent admission include: N/A   We appreciate your assistance with the implementation of these recommendations. Please let us know if there is anything we can help you with at this time.        Thanks!   Einar Grad  08/20/2021 9:29 AM

## 2021-08-20 NOTE — Progress Notes (Addendum)
Progress Note  Patient Name: Henry Lindsey Date of Encounter: 08/20/2021  [facility] Cardiologist: Ida Rogue, MD   Subjective   No chest pain.  Wants to go home.  Says dyspnea is improved.  Has not ambulated yet this morning.  Inpatient Medications    Scheduled Meds:  ascorbic acid  500 mg Oral Daily   aspirin EC  81 mg Oral Daily   atorvastatin  40 mg Oral Daily   cholecalciferol  1,000 Units Oral Daily   clopidogrel  75 mg Oral Daily   docusate sodium  100 mg Oral BID   furosemide  20 mg Oral Daily   gabapentin  400 mg Oral BID   influenza vaccine adjuvanted  0.5 mL Intramuscular Tomorrow-1000   insulin aspart protamine- aspart  15 Units Subcutaneous BID WC   latanoprost  2 drop Both Eyes Daily   lidocaine  1 patch Transdermal Q24H   metoprolol succinate  50 mg Oral Daily   multivitamin with minerals  1 tablet Oral Daily   polyethylene glycol  17 g Oral Daily   sodium chloride flush  3 mL Intravenous Q12H   spironolactone  12.5 mg Oral Daily   timolol  2 drop Both Eyes Q1500   vitamin E  400 Units Oral Daily   Continuous Infusions:  sodium chloride     PRN Meds: sodium chloride, HYDROcodone-acetaminophen, ondansetron, sodium chloride flush, traZODone   Vital Signs    Vitals:   08/19/21 1315 08/19/21 2043 08/20/21 0335 08/20/21 0806  BP: 124/70 109/77 119/82 119/74  Pulse: 85  80 84  Resp:  17 18 18   Temp: 97.6 F (36.4 C) 97.7 F (36.5 C) (!) 97.4 F (36.3 C)   TempSrc: Oral Oral Oral   SpO2: 96% 96% 98% 100%    Intake/Output Summary (Last 24 hours) at 08/20/2021 1116 Last data filed at 08/20/2021 0343 Gross per 24 hour  Intake --  Output 850 ml  Net -850 ml   Last 3 Weights 08/15/2021 08/14/2021 08/13/2021  Weight (lbs) 202 lb 13.2 oz 203 lb 0.7 oz 204 lb 9.6 oz  Weight (kg) 92 kg 92.1 kg 92.806 kg      Telemetry    Normal sinus rhythm without significant ectopy.- Personally Reviewed  ECG    A new EKG is not performed today. -  Personally Reviewed  Physical Exam  Elderly and frail GEN: No acute distress.   Neck: No JVD Cardiac: S4 gallop present.  RRR.  No murmurs, rubs, or S3 gallops.  Respiratory: Clear to auscultation bilaterally. GI: Soft, nontender, non-distended  MS: No edema; No deformity. Neuro:  Nonfocal  Psych: Normal affect   Labs    High Sensitivity Troponin:   Recent Labs  Lab 08/09/21 1231 08/09/21 1409 08/09/21 1658 08/09/21 2151 08/10/21 0643  TROPONINIHS 533* 580* 816* 1,183* 1,076*     Chemistry Recent Labs  Lab 08/16/21 0219 08/17/21 0311 08/18/21 0132 08/19/21 0238 08/20/21 0148  NA 131* 134* 133* 133* 133*  K 4.0 4.3 4.4 4.3 4.6  CL 97* 99 98 97* 100  CO2 27 28 28 27 25   GLUCOSE 149* 113* 142* 116* 144*  BUN 28* 33* 33* 27* 21  CREATININE 1.25* 1.43* 1.34* 1.18 1.07  CALCIUM 8.4* 8.4* 8.6* 8.7* 8.7*  MG 1.8  --   --   --   --   PROT  --  6.8  --   --   --   ALBUMIN  --  2.5*  --   --   --  AST  --  45*  --   --   --   ALT  --  37  --   --   --   ALKPHOS  --  135*  --   --   --   BILITOT  --  1.5*  --   --   --   GFRNONAA 56* 48* 52* >60 >60  ANIONGAP 7 7 7 9 8     Lipids No results for input(s): CHOL, TRIG, HDL, LABVLDL, LDLCALC, CHOLHDL in the last 168 hours.  Hematology Recent Labs  Lab 08/15/21 0458 08/15/21 1338 08/16/21 0219  WBC 5.5 5.2 5.7  RBC 4.93 4.84 4.97  HGB 12.5* 11.7* 12.1*  HCT 39.8 40.1 40.6  MCV 80.7 82.9 81.7  MCH 25.4* 24.2* 24.3*  MCHC 31.4 29.2* 29.8*  RDW 19.0* 19.1* 19.2*  PLT 213 204 220   Thyroid No results for input(s): TSH, FREET4 in the last 168 hours.  BNPNo results for input(s): BNP, PROBNP in the last 168 hours.  DDimer No results for input(s): DDIMER in the last 168 hours.   Radiology    No results found.  Cardiac Studies   CAD with cath 08/15/2021: Diagnostic Dominance: Right Intervention    Patient Profile     87 y.o. male  CAD, ICM, HFrEF, HTN, HL, DMII, GERD, CVA, PAD, and carotid arterial dzs, who  presented October 19 due to progressive dyspnea, increasing abdominal girth, lower extremity edema.  Elevated troponin to 1183.  EF 25 to 30% with global hypokinesis and severe anterior, septal, and apical hypokinesis on echo this admission. High risk/ CHIP performed 08/15/2021 by Dr. Fletcher Anon with Impella backup.  Ostial LAD Esmeralda balloon diameter 3.0 mm in diameter.  Patient has been able ambulate.  No vascular complications.  Still has hypoxia with ambulation and will be discharged with O2 supplementation.  Assessment & Plan    Native coronary artery disease: Chronic total RCA and recent high risk LAD to left main PCI/stent.  Aggressive risk factor modification.  Continue dual antiplatelet therapy x12 months followed by monotherapy with other grill. Acute on chronic systolic heart failure/ischemic cardiomyopathy: Current regimen includes spironolactone 12.5 mg daily, metoprolol succinate 50 mg daily, furosemide 20 mg/day.  Not currently on ARB/Arni or SGLT2 (has failed Jardiance in the past).  ARB/Arni to be considered as outpatient once hemodynamically more stable and kidney function has stabilized.  Current creatinine is back to normal at 1.07. Acute on chronic kidney injury, stage III: Improved Hypoxia: Maintaining adequate O2 saturation at rest.  Does have desaturation with ambulation and therefore will need chronic O2 therapy until further improvement.  Ready for discharge presumably.  Needs to ambulate prior to discharge.  Need to arrange home O2.  Needs basic metabolic panel at [facility] in 1 week.  For questions or updates, please contact [place] Please consult [email or web address] for contact info under        Signed, Sinclair Grooms, MD  08/20/2021, 11:16 AM

## 2021-08-24 NOTE — Telephone Encounter (Signed)
Error - duplicate

## 2021-08-27 ENCOUNTER — Ambulatory Visit: Payer: Medicare Other | Admitting: Medical

## 2021-08-29 ENCOUNTER — Ambulatory Visit: Payer: Medicare Other | Attending: Family | Admitting: Family

## 2021-08-29 ENCOUNTER — Other Ambulatory Visit: Payer: Self-pay

## 2021-08-29 ENCOUNTER — Encounter: Payer: Self-pay | Admitting: Family

## 2021-08-29 VITALS — BP 125/74 | HR 84 | Resp 16 | Ht 71.0 in | Wt 178.4 lb

## 2021-08-29 DIAGNOSIS — I890 Lymphedema, not elsewhere classified: Secondary | ICD-10-CM | POA: Diagnosis not present

## 2021-08-29 DIAGNOSIS — E1122 Type 2 diabetes mellitus with diabetic chronic kidney disease: Secondary | ICD-10-CM

## 2021-08-29 DIAGNOSIS — K219 Gastro-esophageal reflux disease without esophagitis: Secondary | ICD-10-CM | POA: Diagnosis not present

## 2021-08-29 DIAGNOSIS — Z8673 Personal history of transient ischemic attack (TIA), and cerebral infarction without residual deficits: Secondary | ICD-10-CM | POA: Insufficient documentation

## 2021-08-29 DIAGNOSIS — I110 Hypertensive heart disease with heart failure: Secondary | ICD-10-CM | POA: Insufficient documentation

## 2021-08-29 DIAGNOSIS — I5022 Chronic systolic (congestive) heart failure: Secondary | ICD-10-CM | POA: Diagnosis not present

## 2021-08-29 DIAGNOSIS — Z7902 Long term (current) use of antithrombotics/antiplatelets: Secondary | ICD-10-CM | POA: Diagnosis not present

## 2021-08-29 DIAGNOSIS — E785 Hyperlipidemia, unspecified: Secondary | ICD-10-CM | POA: Insufficient documentation

## 2021-08-29 DIAGNOSIS — Z7982 Long term (current) use of aspirin: Secondary | ICD-10-CM | POA: Insufficient documentation

## 2021-08-29 DIAGNOSIS — I2510 Atherosclerotic heart disease of native coronary artery without angina pectoris: Secondary | ICD-10-CM

## 2021-08-29 DIAGNOSIS — Z888 Allergy status to other drugs, medicaments and biological substances status: Secondary | ICD-10-CM | POA: Diagnosis not present

## 2021-08-29 DIAGNOSIS — I739 Peripheral vascular disease, unspecified: Secondary | ICD-10-CM | POA: Insufficient documentation

## 2021-08-29 DIAGNOSIS — I255 Ischemic cardiomyopathy: Secondary | ICD-10-CM | POA: Diagnosis not present

## 2021-08-29 DIAGNOSIS — E1151 Type 2 diabetes mellitus with diabetic peripheral angiopathy without gangrene: Secondary | ICD-10-CM | POA: Insufficient documentation

## 2021-08-29 DIAGNOSIS — Z955 Presence of coronary angioplasty implant and graft: Secondary | ICD-10-CM | POA: Insufficient documentation

## 2021-08-29 DIAGNOSIS — Z794 Long term (current) use of insulin: Secondary | ICD-10-CM

## 2021-08-29 DIAGNOSIS — N181 Chronic kidney disease, stage 1: Secondary | ICD-10-CM

## 2021-08-29 DIAGNOSIS — Z79899 Other long term (current) drug therapy: Secondary | ICD-10-CM | POA: Diagnosis not present

## 2021-08-29 DIAGNOSIS — I779 Disorder of arteries and arterioles, unspecified: Secondary | ICD-10-CM | POA: Diagnosis not present

## 2021-08-29 NOTE — Progress Notes (Signed)
Patient ID: Henry Lindsey MRN: [id number]; DOB: 04/18/1933   [name] is a 87 y.o. male with a history of CAD, ICM, HFrEF, HTN, HL, DMII, GERD, CVA, PAD, and carotid arterial disease.  Admitted 10/20-10/26/22 at [facility] due to progressive dyspnea, increasing abdominal girth, lower extremity edema and transferred to [facility] for high risk PCI. Successful Impella supported high risk PCI of the ostial LAD with atherectomy and drug-eluting stent placement.   Echo 08/10/21: Left Ventricle: Left ventricular ejection fraction, by estimation, is 25 to 30%. The left ventricle has severely decreased function. The left ventricle demonstrates global hypokinesis. The average left ventricular global longitudinal strain is -6.3 %. The global longitudinal strain is abnormal. The left ventricular internal  cavity size was normal in size. There is no left ventricular hypertrophy. Left ventricular diastolic parameters are consistent with Grade II diastolic dysfunction (pseudonormalization).   Right Ventricle: The right ventricular size is moderately enlarged. No increase in right ventricular wall thickness. Right ventricular systolic function is moderately reduced. There is mildly elevated pulmonary artery systolic pressure. The tricuspid  regurgitant velocity is 2.58 m/s, and with an assumed right atrial  pressure of 15 mmHg, the estimated right ventricular systolic pressure is 82.5 mmHg.   LHC 08/15/21:   Mid LM to Dist LM lesion is 30% stenosed.   Ost LAD lesion is 85% stenosed.   Prox LAD lesion is 50% stenosed.   Dist LAD lesion is 35% stenosed.   Prox Cx lesion is 50% stenosed.   Prox RCA to Dist RCA lesion is 100% stenosed.   1st Diag lesion is 80% stenosed.   1st Mrg lesion is 70% stenosed.   A drug-eluting stent was successfully placed using a STENT ONYX FRONTIER [id number].   Post intervention, there is a 0% residual stenosis.   Post intervention, there is a 0% residual stenosis.   Post intervention, there  is a 0% residual stenosis.   Successful Impella supported high risk PCI of the ostial LAD with atherectomy and drug-eluting stent placement.  The stent extended 1 to 2 mm into the left main coronary artery to ensure coverage of the ostium.  There was normal flow into the  left circumflex.Moderately elevated left ventricular end-diastolic pressure at 24 mmHg at the beginning of the case.  He presents today for his initial visit to the heart failure clinic. He complains of moderate fatigue with minimal exertion but does note that he is getting better since his discharge. He does endorse pedal edema that is chronic, but also improved since his discharge. He denies CP, worsening SOB, orthopnea, palpitations, cough, presyncope or syncope.   He weighs daily and reports that his weight continues to trend down. His BS today was 113.   Past Medical History:  Diagnosis Date   Arthritis    BPH (benign prostatic hyperplasia)    CAD (coronary artery disease)    a. 11/2020 Cath: LM 50d, LAD sev ost dzs, mod to sev prox/mid dzs. D1 sev dzs. LCX mild prox dzs, OM2 mod-sev dzs, RCA 100p. RPL and RPDA fill via L-->R collats. EF 35%. Seen by CT surgery-->Med Rx advised.   Carotid artery stenosis    a. 11/2010 U/S: <50% bilaterally; b. 03/2021 40-59% bilat ICA stenoses.   COPD (chronic obstructive pulmonary disease) ([place])    noted CT 10/05/16 former smoker quit age 42    CVA (cerebral infarction) 11/2010   r thalamic lacunar   Diabetes mellitus    GERD (gastroesophageal reflux disease)  HFrEF (heart failure with reduced ejection fraction) ([place])    a. 10/2020 Echo: EF 35%.   Hyperlipidemia    Hypertension    Ischemic cardiomyopathy    a. 10/2020 Echo: EF 35%. Nl RVSP. Mod MR. Mild TR; b. 11/2020 Cath:  CO/CI 5.05/2.49. LV gram: EF 35%.   Lower extremity cellulitis    Neuromuscular disorder ([place])    Peripheral vascular disease in diabetes mellitus ([place])    a. 06/2012 95% occlusion s/p PTA R SFA (Dr. Lucky Cowboy); b.  03/2021 ABIs: R 1.02, L 0.96.   Stroke [facility])    Past Surgical History:  Procedure Laterality Date   ABDOMINAL AORTOGRAM N/A 08/14/2021   Procedure: ABDOMINAL AORTOGRAM;  Surgeon: Nelva Bush, MD;  Location: [place] CV LAB;  Service: Cardiovascular;  Laterality: N/A;   CHOLECYSTECTOMY N/A 11/13/2016   Procedure: LAPAROSCOPIC CHOLECYSTECTOMY WITH INTRAOPERATIVE CHOLANGIOGRAM;  Surgeon: Olean Ree, MD;  Location: [facility] ORS;  Service: General;  Laterality: N/A;   COLONOSCOPY WITH PROPOFOL N/A 02/10/2018   Procedure: COLONOSCOPY WITH PROPOFOL;  Surgeon: Lucilla Lame, MD;  Location: [facility] ENDOSCOPY;  Service: Endoscopy;  Laterality: N/A;   CORONARY ATHERECTOMY N/A 08/15/2021   Procedure: CORONARY ATHERECTOMY;  Surgeon: Wellington Hampshire, MD;  Location: [place] CV LAB;  Service: Cardiovascular;  Laterality: N/A;   CORONARY STENT INTERVENTION W/IMPELLA N/A 08/15/2021   Procedure: Coronary Stent Intervention w/Impella;  Surgeon: Wellington Hampshire, MD;  Location: [place] CV LAB;  Service: Cardiovascular;  Laterality: N/A;   ERCP N/A 11/17/2016   Procedure: ENDOSCOPIC RETROGRADE CHOLANGIOPANCREATOGRAPHY (ERCP);  Surgeon: Irene Shipper, MD;  Location: [facility] END[facility]COPY;  Service: Endoscopy;  Laterality: N/A;   ERCP N/A 02/04/2017   Procedure: ENDOSCOPIC RETROGRADE CHOLANGIOPANCREATOGRAPHY (ERCP) Stent removal;  Surgeon: Lucilla Lame, MD;  Location: [facility] ENDOSCOPY;  Service: Endoscopy;  Laterality: N/A;   IR GENERIC HISTORICAL  10/06/2016   IR PERC CHOLECYSTOSTOMY 10/06/2016 Aletta Edouard, MD [facility]-INTERV RAD   JOINT REPLACEMENT Left 2014   left knee   RIGHT/LEFT HEART CATH AND CORONARY ANGIOGRAPHY N/A 11/24/2020   Procedure: RIGHT/LEFT HEART CATH AND CORONARY ANGIOGRAPHY;  Surgeon: Minna Merritts, MD;  Location: [place] CV LAB;  Service: Cardiovascular;  Laterality: N/A;   RIGHT/LEFT HEART CATH AND CORONARY ANGIOGRAPHY N/A 08/14/2021   Procedure: RIGHT/LEFT HEART CATH AND CORONARY  ANGIOGRAPHY;  Surgeon: Nelva Bush, MD;  Location: [place] CV LAB;  Service: Cardiovascular;  Laterality: N/A;   TOTAL KNEE ARTHROPLASTY Right 08/08/2020   Procedure: TOTAL KNEE ARTHROPLASTY;  Surgeon: Lovell Sheehan, MD;  Location: [facility] ORS;  Service: Orthopedics;  Laterality: Right;   UPPER GASTROINTESTINAL ENDOSCOPY     WRIST SURGERY     Family History  Problem Relation Age of Onset   Diabetes Mother    Social History   Tobacco Use   Smoking status: Former    Packs/day: 0.50    Years: 33.00    Pack years: 16.50    Types: Cigarettes    Quit date: 04/21/1968    Years since quitting: 53.3   Smokeless tobacco: Never  Substance Use Topics   Alcohol use: No   Allergies  Allergen Reactions   Jardiance [Empagliflozin]     Rash all over and lips turn purple.    Metformin And Related Diarrhea   Trazodone Other (See Comments)    Extreme hallucinations/ psychosis    Prior to Admission medications   Medication Sig Start Date End Date Taking? Authorizing Provider  albuterol (VENTOLIN HFA) 108 (90 Base) MCG/ACT inhaler Inhale 2  puffs into the lungs every 6 (six) hours as needed. 10/29/20   [provider]  ascorbic acid (VITAMIN C) 500 MG tablet Take 500 mg by mouth daily.     [provider]  aspirin EC 81 MG tablet Take 1 tablet (81 mg total) by mouth daily. Swallow whole. 01/31/21   Minna Merritts, MD  atorvastatin (LIPITOR) 40 MG tablet Take 1 tablet (40 mg total) by mouth daily. 12/08/20   Minna Merritts, MD  Blood Glucose Monitoring Suppl (ONE TOUCH ULTRA SYSTEM KIT) w/Device KIT 1 kit by Does not apply route once. Use DX code E11.59 10/18/15   [name], MD  cholecalciferol (VITAMIN D) 25 MCG (1000 UNIT) tablet Take 1,000 Units by mouth daily.    [provider]  cholestyramine (QUESTRAN) 4 g packet Take 4 g by mouth 3 (three) times daily with meals.    [provider]  cilostazol (PLETAL) 100 MG tablet Take 100 mg by mouth 2  (two) times daily. 06/01/21   [provider]  clopidogrel (PLAVIX) 75 MG tablet Take 1 tablet (75 mg total) by mouth daily with breakfast. 08/15/21   Fritzi Mandes, MD  diclofenac Sodium (VOLTAREN) 1 % GEL Apply 2 g topically 2 (two) times daily as needed (pain).    [provider]  furosemide (LASIX) 20 MG tablet Take 1 tablet (20 mg total) by mouth daily. 08/21/21   Bhagat, Crista Luria, PA  gabapentin (NEURONTIN) 400 MG capsule Take 400 mg by mouth 2 (two) times daily.    [provider]  HYDROcodone-acetaminophen (NORCO/VICODIN) 5-325 MG tablet Take 1 tablet by mouth every 6 (six) hours as needed. 10/27/20   [provider]  INS SYRINGE/NEEDLE 1CC/28G (B-D INSULIN SYRINGE 1CC/28G) 28G X 1/2" 1 ML MISC USE TO ADMINISTER INSULIN DAILY 02/13/16   [name], MD  insulin aspart protamine- aspart (NOVOLOG MIX 70/30) (70-30) 100 UNIT/ML injection Inject 0.15 mLs (15 Units total) into the skin 2 (two) times daily with a meal. 08/14/21   Fritzi Mandes, MD  latanoprost (XALATAN) 0.005 % ophthalmic solution Place 2 drops into both eyes at bedtime.  11/01/15   [provider]  metoprolol succinate (TOPROL-XL) 50 MG 24 hr tablet Take 1 tablet (50 mg total) by mouth daily. Take with or immediately following a meal. 08/21/21   Bhagat, [place], PA  Multiple Vitamin (MULTIVITAMIN WITH MINERALS) TABS tablet Take 1 tablet by mouth daily.    [provider]  nitroGLYCERIN (NITROSTAT) 0.4 MG SL tablet Place 1 tablet (0.4 mg total) under the tongue every 5 (five) minutes as needed for chest pain. MAXIMUM 3 TABLETS 12/08/20   Minna Merritts, MD  ONE TOUCH ULTRA TEST test strip USE 1 STRIP TO TEST THREE TIMES DAILY AS DIRECTED 08/31/18   [name], MD  [facility] DELICA LANCETS 16X MISC Use three times daily to check blood sugar. 10/18/15   [name], MD  pantoprazole (PROTONIX) 40 MG tablet Take 40 mg by mouth daily.    [provider]  silver  sulfADIAZINE (SILVADENE) 1 % cream Apply topically. 03/22/21   [provider]  spironolactone (ALDACTONE) 25 MG tablet Take 0.5 tablets (12.5 mg total) by mouth daily. 08/21/21   Bhagat, Crista Luria, PA  tamsulosin (FLOMAX) 0.4 MG CAPS capsule Take 0.4 mg by mouth daily after breakfast.    [provider]  timolol (TIMOPTIC) 0.5 % ophthalmic solution Place 2 drops into both eyes daily.  12/19/17   [provider]  traZODone (DESYREL) 50 MG tablet trazodone 50 mg tablet    [provider]  vitamin B-12 (CYANOCOBALAMIN) 500 MCG tablet Take 500 mcg by mouth daily.    [provider]  vitamin E 180 MG (400 UNITS) capsule Take 400 Units by mouth daily.    [provider]    Review of Systems  Constitutional:  Positive for malaise/fatigue.  HENT: Negative.    Eyes: Negative.   Respiratory:  Positive for shortness of breath (with exertion). Negative for cough and wheezing.   Cardiovascular:  Positive for leg swelling. Negative for chest pain, palpitations and orthopnea.  Gastrointestinal: Negative.   Genitourinary: Negative.   Musculoskeletal: Negative.  Negative for falls.  Skin: Negative.   Neurological:  Negative for dizziness, weakness and headaches.  Endo/Heme/Allergies: Negative.   Psychiatric/Behavioral:  The patient is not nervous/anxious and does not have insomnia.     Vitals:   08/29/21 1435  BP: 125/74  Pulse: 84  Resp: 16  SpO2: 97%  Weight: 178 lb 6 oz (80.9 kg)  Height: '5\' 11"'  (1.803 m)   Wt Readings from Last 3 Encounters:  08/29/21 178 lb 6 oz (80.9 kg)  08/15/21 202 lb 13.2 oz (92 kg)  07/20/21 189 lb (85.7 kg)   Lab Results  Component Value Date   CREATININE 1.07 08/20/2021   CREATININE 1.18 08/19/2021   CREATININE 1.34 (H) 08/18/2021    Physical Exam Vitals and nursing note reviewed. Exam conducted with a chaperone present (friend).  Constitutional:      General: He is not in acute distress.    Appearance: He  is not ill-appearing.  Neck:     Vascular: No carotid bruit.  Cardiovascular:     Rate and Rhythm: Normal rate and regular rhythm.     Heart sounds: Normal heart sounds.  Pulmonary:     Effort: No respiratory distress.     Breath sounds: No wheezing or rales.  Abdominal:     Palpations: Abdomen is soft.  Musculoskeletal:     Right lower leg: Edema present.     Left lower leg: Edema present.  Skin:    General: Skin is warm and dry.  Neurological:     Mental Status: He is alert and oriented to person, place, and time.  Psychiatric:        Mood and Affect: Mood normal.        Thought Content: Thought content normal.     Comments: Poor historian, unclear if related to cognitive decline or recent hospitalization      Assessment & Plan: Heart failure with reduced ejection fraction - NYHA III - euvolemic - weighing daily; instructed to call for an overnight weight gain of > 2 pounds or a weekly weight gain of > 5 pounds - BNP 08/09/21 4500 - on GDMT of metoprolol succinate and spironolactone  - allergic to Jardiance - BP historically soft, today was 125/75. Does not check at home.  - consider adding ARB, not clear if his BP is WNL for him as this seems to be an isolated reading - dtr fills his pill box, much confusion over which diuretic to take and when. Explained to take furosemide 20 mg QD. Discussed when he can take an additional dose.  - O2 HS and at rest since dc - advised to stop taking cilostazol as it's bad for your heart; cardiology had previously advised to stop it and patient thinks he's not taking it anymore but is unclear  2.  CAD - DES to ostial LAD - ASA, plavix - will see cardiology on 08/31/21  3.  DM - checks BS daily  4. Lymphedema - stage 2 - limited in his ability to exercise due to his shortness of breath - Jolane Bankhead appearance but no open wounds - unclear how or if he wears compression socks, however he was not wearing at visit - consider compression  boots if edema persists   Medication bottles brought in and reviewed. Attempted to clarify medication list. Pts dtr fills his medication pill box every three weeks.   Return in one month.

## 2021-08-29 NOTE — Patient Instructions (Signed)
Stop taking the cilostazol, it is bad for heart failure  Return in one month or sooner if needed.   If you gain more than 3 lbs over night or 5 lbs/week, you may take 1 additional 20 mg furosemide.  Do NOT TAKE ANYTHING IN THE BLUE ZIPLOCK BAG!  You have two bottles of furosemide, one bottle is 40 mg and one bottle is 20 mg.  You are to take one 20 mg furosemide/day. If you take one out of the 40 mg bottle, half it.

## 2021-08-31 ENCOUNTER — Ambulatory Visit: Payer: Medicare Other | Admitting: Medical

## 2021-09-11 ENCOUNTER — Other Ambulatory Visit: Payer: Self-pay | Admitting: Cardiovascular Disease

## 2021-09-11 NOTE — Telephone Encounter (Signed)
Patients daughter calling stating they are completely out of patients plavix. He was just in the hospital and had a stent placed.   Patients daughter states that Dr. Posey Pronto -- the doctor in the hospital will not refill this for the patient.   Please send in a 90 day supply to [facility] -- [address].

## 2021-09-11 NOTE — Telephone Encounter (Signed)
Please advise if ok to refill. 

## 2021-09-19 ENCOUNTER — Encounter (INDEPENDENT_AMBULATORY_CARE_PROVIDER_SITE_OTHER): Payer: Medicare Other

## 2021-09-25 ENCOUNTER — Other Ambulatory Visit: Payer: Self-pay

## 2021-09-25 ENCOUNTER — Ambulatory Visit (INDEPENDENT_AMBULATORY_CARE_PROVIDER_SITE_OTHER): Payer: Medicare Other | Admitting: Medical

## 2021-09-25 ENCOUNTER — Encounter: Payer: Self-pay | Admitting: Medical

## 2021-09-25 VITALS — BP 130/72 | HR 97 | Ht 71.0 in | Wt 176.0 lb

## 2021-09-25 DIAGNOSIS — I10 Essential (primary) hypertension: Secondary | ICD-10-CM | POA: Diagnosis not present

## 2021-09-25 DIAGNOSIS — I059 Rheumatic mitral valve disease, unspecified: Secondary | ICD-10-CM

## 2021-09-25 DIAGNOSIS — I5022 Chronic systolic (congestive) heart failure: Secondary | ICD-10-CM

## 2021-09-25 DIAGNOSIS — I25118 Atherosclerotic heart disease of native coronary artery with other forms of angina pectoris: Secondary | ICD-10-CM

## 2021-09-25 DIAGNOSIS — I255 Ischemic cardiomyopathy: Secondary | ICD-10-CM | POA: Diagnosis not present

## 2021-09-25 DIAGNOSIS — I739 Peripheral vascular disease, unspecified: Secondary | ICD-10-CM

## 2021-09-25 DIAGNOSIS — N183 Chronic kidney disease, stage 3 unspecified: Secondary | ICD-10-CM

## 2021-09-25 DIAGNOSIS — E782 Mixed hyperlipidemia: Secondary | ICD-10-CM

## 2021-09-25 MED ORDER — ENTRESTO 24-26 MG PO TABS: 1.0000 | ORAL_TABLET | Freq: Two times a day (BID) | ORAL | 1 refills | Status: DC

## 2021-09-25 NOTE — Patient Instructions (Signed)
Medication Instructions:   Your physician has recommended you make the following change in your medication:   1) START Entresto 24-26 mg TWICE daily     - You may use samples given today and use 30 day coupon when you pick up first prescription at pharmacy  2) TAKE Furosemide (Lasix) 20 mg TWICE daily x 3 days, THEN resume Furosemide (Lasix) 20 mg daily thereafter  *If you need a refill on your cardiac medications before your next appointment, please call your pharmacy*   Lab Work:  1) BNP today  2) Your physician recommends that you return for lab work in: [place] (BM[facility])      - This lab does not require fasting   If you have labs (blood work) drawn today and your tests are completely normal, you will receive your results only by: [place] (if you have [place]) OR A paper copy in the mail If you have any lab test that is abnormal or we need to change your treatment, we will call you to review the results.   Testing/Procedures:  None ordered   Follow-Up: At [facility], you and your health needs are our priority.  As part of our continuing mission to provide you with exceptional heart care, we have created designated Provider Care Teams.  These Care Teams include your primary Cardiologist (physician) and Advanced Practice Providers (APPs -  Physician Assistants and Nurse Practitioners) who all work together to provide you with the care you need, when you need it.  We recommend signing up for the patient portal called "MyChart".  Sign up information is provided on this After Visit Summary.  MyChart is used to connect with patients for Virtual Visits (Telemedicine).  Patients are able to view lab/test results, encounter notes, upcoming appointments, etc.  Non-urgent messages can be sent to your provider as well.   To learn more about what you can do with MyChart, go to [email or web address].    Your next appointment:   1 month(s)  The format for your next appointment:    In Person  Provider:   You may see Ida Rogue, MD or one of the following Advanced Practice Providers on your designated Care Team:   Murray Hodgkins, NP Christell Faith, PA-C Cadence Kathlen Mody, [place]

## 2021-09-25 NOTE — Progress Notes (Signed)
Cardiology Office Note:    Date:  09/26/2021   ID:  FLORIAN CHAUCA, DOB 02-04-1933, MRN [id number]  PCP:  Baxter Hire, MD  [facility] Cardiologist:  Dr. Arvid Right [facility] Electrophysiologist:  None   Referring MD: Baxter Hire, MD   Chief Complaint: [facility] hospital follow-up  History of Present Illness:    Henry Lindsey is a 87 y.o. male with a hx of CAD, ICM, HFrEF, HTN, HL, DM2, GERD, CVA, PAD, carotid dzs who is being seen for hospital follow-up.   In the setting of respiratory failure from bronchitis and PNA in 09/2020. Echo at that time showed LV dysfunction w/ EF 35% nl RVSP, mod MR, mild TR, mild pericardial effusion. He was placed on lasix and referred to cardiology. Cath in 11/2020 showed severe diffuse LAD, diag, and OM dzs with an occluded RCA. CO/CL. EF 35%. He was evaluated by CT surgery and felt to be a poor surgical candidate and has been medically managed since. He was seen in the clinic 9/30 and was stable. He reported he was taking Pletal and was advised to d/c this. H/o of rash on SGLT2i.  The patient was admitted to [facility] 10/19 for acute heart failure and NSTEMI. He was given IV lasix, started on IV heparin and admitted. GDMT limited due to hypotension and CKD. Echo showed LVEF 20-25%. Cath images were reviewed from February 2022 which showed chronically occluded RCA with L>R collaterals, severe calcified ostial LAD stenosis extending into the distal left main. After a long discussion with the family, it was decided to do re-look cath. If anatomy unchanged, plan to transfer the patient to [facility] for high risk PCI with mechanical support. R/L heart cath 10/25 was unchanged showing Severe multivessel CAD, including 80-90% ostial LAD stenosis and CTO pRCA as seen on prior, moderately elevated left heart, right heart, and pulmonary artery pressures, moderately-severely reduced cardiac output/index, ectatic abdominal aorta/external iliac arteries, and common femoral arteries  without significant disease, 70% right internal iliac artery stenosis, 50% ostial stenosis or right renal artery.  He eventually underwent repeat heart cath showing unchanged anatomy from heart cath 11/2020 (details of cath above).  He was transferred to [facility] for high risk PCI.  Underwent successful Impella supported high risk PCI of ostial LAD with atherectomy and DES x1.  Stent did extend 1 to 2 mm into the left main coronary artery to ensure coverage of the ostium.  No complications noted.  Recommendations for DAPT with aspirin/Plavix for at least 1 year preferably longer.   Today, the patient reports he has been doing much better since the stent. Has knee problems, mainly in the left knee. Had fluid in the knee that was previously taken out. Still has knee pain, he's using a walker. Says the fluid is causing the pain. No chest pain or SOB. He is taking Lasix 12m daily. He is taking his weight every day. Minimal LLE. No orthopnea, pnd. He is doing PT at home. BP and HR good today. Cath site, right groin has healed.    Past Medical History:  Diagnosis Date   Arthritis    BPH (benign prostatic hyperplasia)    CAD (coronary artery disease)    a. 11/2020 Cath: LM 50d, LAD sev ost dzs, mod to sev prox/mid dzs. D1 sev dzs. LCX mild prox dzs, OM2 mod-sev dzs, RCA 100p. RPL and RPDA fill via L-->R collats. EF 35%. Seen by CT surgery-->Med Rx advised.   Carotid artery stenosis  a. 11/2010 U/S: <50% bilaterally; b. 03/2021 40-59% bilat ICA stenoses.   COPD (chronic obstructive pulmonary disease) ([place])    noted CT 10/05/16 former smoker quit age 63    CVA (cerebral infarction) 11/2010   r thalamic lacunar   Diabetes mellitus    GERD (gastroesophageal reflux disease)    HFrEF (heart failure with reduced ejection fraction) ([place])    a. 10/2020 Echo: EF 35%.   Hyperlipidemia    Hypertension    Ischemic cardiomyopathy    a. 10/2020 Echo: EF 35%. Nl RVSP. Mod MR. Mild TR; b. 11/2020 Cath:  CO/CI  5.05/2.49. LV gram: EF 35%.   Lower extremity cellulitis    Neuromuscular disorder ([place])    Peripheral vascular disease in diabetes mellitus ([place])    a. 06/2012 95% occlusion s/p PTA R SFA (Dr. Lucky Cowboy); b. 03/2021 ABIs: R 1.02, L 0.96.   Stroke [facility])     Past Surgical History:  Procedure Laterality Date   ABDOMINAL AORTOGRAM N/A 08/14/2021   Procedure: ABDOMINAL AORTOGRAM;  Surgeon: Nelva Bush, MD;  Location: [place] CV LAB;  Service: Cardiovascular;  Laterality: N/A;   CHOLECYSTECTOMY N/A 11/13/2016   Procedure: LAPAROSCOPIC CHOLECYSTECTOMY WITH INTRAOPERATIVE CHOLANGIOGRAM;  Surgeon: Olean Ree, MD;  Location: [facility] ORS;  Service: General;  Laterality: N/A;   COLONOSCOPY WITH PROPOFOL N/A 02/10/2018   Procedure: COLONOSCOPY WITH PROPOFOL;  Surgeon: Lucilla Lame, MD;  Location: [facility] ENDOSCOPY;  Service: Endoscopy;  Laterality: N/A;   CORONARY ATHERECTOMY N/A 08/15/2021   Procedure: CORONARY ATHERECTOMY;  Surgeon: Wellington Hampshire, MD;  Location: [place] CV LAB;  Service: Cardiovascular;  Laterality: N/A;   CORONARY STENT INTERVENTION W/IMPELLA N/A 08/15/2021   Procedure: Coronary Stent Intervention w/Impella;  Surgeon: Wellington Hampshire, MD;  Location: [place] [facility];  Service: Cardiovascular;  Laterality: N/A;   ERCP N/A 11/17/2016   Procedure: ENDOSCOPIC RETROGRADE CHOLANGIOPANCREATOGRAPHY (ERCP);  Surgeon: Irene Shipper, MD;  Location: [facility] ENDOSCOPY;  Service: Endoscopy;  Laterality: N/A;   ERCP N/A 02/04/2017   Procedure: ENDOSCOPIC RETROGRADE CHOLANGIOPANCREATOGRAPHY (ERCP) Stent removal;  Surgeon: Lucilla Lame, MD;  Location: [facility] ENDOSCOPY;  Service: Endoscopy;  Laterality: N/A;   IR GENERIC HISTORICAL  10/06/2016   IR PERC CHOLECYSTOSTOMY 10/06/2016 Aletta Edouard, MD [facility]-INTERV RAD   JOINT REPLACEMENT Left 2014   left knee   RIGHT/LEFT HEART CATH AND CORONARY ANGIOGRAPHY N/A 11/24/2020   Procedure: RIGHT/LEFT HEART CATH AND CORONARY ANGIOGRAPHY;  Surgeon: Minna Merritts, MD;  Location: [place] CV LAB;  Service: Cardiovascular;  Laterality: N/A;   RIGHT/LEFT HEART CATH AND CORONARY ANGIOGRAPHY N/A 08/14/2021   Procedure: RIGHT/LEFT HEART CATH AND CORONARY ANGIOGRAPHY;  Surgeon: Nelva Bush, MD;  Location: [place] CV LAB;  Service: Cardiovascular;  Laterality: N/A;   TOTAL KNEE ARTHROPLASTY Right 08/08/2020   Procedure: TOTAL KNEE ARTHROPLASTY;  Surgeon: Lovell Sheehan, MD;  Location: [facility] ORS;  Service: Orthopedics;  Laterality: Right;   UPPER GASTROINTESTINAL ENDOSCOPY     WRIST SURGERY      Current Medications: Current Meds  Medication Sig   ascorbic acid (VITAMIN C) 500 MG tablet Take 500 mg by mouth daily.    aspirin EC 81 MG tablet Take 1 tablet (81 mg total) by mouth daily. Swallow whole.   atorvastatin (LIPITOR) 40 MG tablet Take 1 tablet (40 mg total) by mouth daily.   Blood Glucose Monitoring Suppl (ONE TOUCH ULTRA SYSTEM KIT) w/Device KIT 1 kit by Does not apply route once. Use DX code E11.59   cholecalciferol (  VITAMIN D) 25 MCG (1000 UNIT) tablet Take 1,000 Units by mouth daily.   cilostazol (PLETAL) 100 MG tablet Take 100 mg by mouth 2 (two) times daily.   clopidogrel (PLAVIX) 75 MG tablet TAKE 1 TABLET(75 MG) BY MOUTH DAILY WITH BREAKFAST   cyclobenzaprine (FLEXERIL) 5 MG tablet Take 5-10 mg by mouth daily as needed.   diclofenac Sodium (VOLTAREN) 1 % GEL Apply 2 g topically 2 (two) times daily as needed (pain).   furosemide (LASIX) 20 MG tablet Take 1 tablet (20 mg total) by mouth daily.   gabapentin (NEURONTIN) 400 MG capsule Take 400 mg by mouth 2 (two) times daily.   HYDROcodone-acetaminophen (NORCO/VICODIN) 5-325 MG tablet Take 1 tablet by mouth every 6 (six) hours as needed.   INS SYRINGE/NEEDLE 1CC/28G (B-D INSULIN SYRINGE 1CC/28G) 28G X 1/2" 1 ML MISC USE TO ADMINISTER INSULIN DAILY   insulin aspart protamine- aspart (NOVOLOG MIX 70/30) (70-30) 100 UNIT/ML injection Inject 0.15 mLs (15 Units total) into the skin 2  (two) times daily with a meal.   latanoprost (XALATAN) 0.005 % ophthalmic solution Place 2 drops into both eyes at bedtime.    metoprolol succinate (TOPROL-XL) 50 MG 24 hr tablet Take 1 tablet (50 mg total) by mouth daily. Take with or immediately following a meal.   Multiple Vitamin (MULTIVITAMIN WITH MINERALS) TABS tablet Take 1 tablet by mouth daily.   nitroGLYCERIN (NITROSTAT) 0.4 MG SL tablet Place 1 tablet (0.4 mg total) under the tongue every 5 (five) minutes as needed for chest pain. MAXIMUM 3 TABLETS   ONE TOUCH ULTRA TEST test strip USE 1 STRIP TO TEST THREE TIMES DAILY AS DIRECTED   ONETOUCH DELICA LANCETS 76B MISC Use three times daily to check blood sugar.   pantoprazole (PROTONIX) 40 MG tablet Take 40 mg by mouth daily.   sacubitril-valsartan (ENTRESTO) 24-26 MG Take 1 tablet by mouth 2 (two) times daily.   spironolactone (ALDACTONE) 25 MG tablet Take 0.5 tablets (12.5 mg total) by mouth daily.   tamsulosin (FLOMAX) 0.4 MG CAPS capsule Take 0.4 mg by mouth daily after breakfast.   timolol (TIMOPTIC) 0.5 % ophthalmic solution Place 2 drops into both eyes daily.    vitamin B-12 (CYANOCOBALAMIN) 500 MCG tablet Take 500 mcg by mouth daily.   vitamin E 180 MG (400 UNITS) capsule Take 400 Units by mouth daily.     Allergies:   Jardiance [empagliflozin], Metformin and related, and Trazodone   Social History   Socioeconomic History   Marital status: Widowed    Spouse name: Enid Derry, deceased   Number of children: 2   Years of education: 23   Highest education level: Not on file  Occupational History    Employer: retired  Tobacco Use   Smoking status: Former    Packs/day: 0.50    Years: 33.00    Pack years: 16.50    Types: Cigarettes    Quit date: 04/21/1968    Years since quitting: 53.4   Smokeless tobacco: Never  Vaping Use   Vaping Use: Never used  Substance and Sexual Activity   Alcohol use: No   Drug use: No   Sexual activity: Not Currently    Partners: Female     Birth control/protection: None  Other Topics Concern   Not on file  Social History Narrative   Widowed in 2014; dating again    Social Determinants of Health   Financial Resource Strain: Not on file  Food Insecurity: Not on file  Transportation Needs: Not  on file  Physical Activity: Not on file  Stress: Not on file  Social Connections: Not on file     Family History: The patient's family history includes Diabetes in his mother.  ROS:   Please see the history of present illness.     All other systems reviewed and are negative.  EKGs/Labs/Other Studies Reviewed:    The following studies were reviewed today:  Coronary Stent Intervention w/Impella 08/15/21  CORONARY ATHERECTOMY    Conclusion       Mid LM to Dist LM lesion is 30% stenosed.   Ost LAD lesion is 85% stenosed.   Prox LAD lesion is 50% stenosed.   Dist LAD lesion is 35% stenosed.   Prox Cx lesion is 50% stenosed.   Prox RCA to Dist RCA lesion is 100% stenosed.   1st Diag lesion is 80% stenosed.   1st Mrg lesion is 70% stenosed.   A drug-eluting stent was successfully placed using a STENT ONYX FRONTIER [id number].   Post intervention, there is a 0% residual stenosis.   Post intervention, there is a 0% residual stenosis.   Post intervention, there is a 0% residual stenosis.   Successful Impella supported high risk PCI of the ostial LAD with atherectomy and drug-eluting stent placement.  The stent extended 1 to 2 mm into the left main coronary artery to ensure coverage of the ostium.  There was normal flow into the  left circumflex. Moderately elevated left ventricular end-diastolic pressure at 24 mmHg at the beginning of the case.   Recommendations: Dual antiplatelet therapy for at least 12 months and preferably longer. Aggressive treatment of risk factors. Optimize heart failure therapy.  Monitor renal function closely.  135 mL of contrast was used.   Diagnostic Dominance: Right Intervention    Cardiac  cath 08/14/21 PV cath Conclusions: Severe multivessel coronary artery disease, including 80-90% ostial LAD stenosis and chronic total occlusion of the proximal RCA, a seen on prior catheterization in 11/2020. Moderately elevated left heart, right heart, and pulmonary artery pressures. Moderately-severely reduced cardiac output/index. Ectatic abdominal aorta common/external iliac arteries, and common femoral arteries without significant disease.  Focal 70% right internal iliac artery stenosis is noted. 50% ostial stenosis of right renal artery.   Recommendations: Transfer to [facility] for consideration of high risk PCI to LMCA/ostial LAD.  Given heavy calcification and reduced LVEF, atherectomy and Impella support will need to be considered.  I will load the patient with clopidogrel 600 mg today, followed by 75 mg daily thereafter. Maintain net even to slightly negative fluid balance following contrast exposure today and chronic kidney disease. Escalate goal-directed medical therapy of acute on chronic HFrEF, as tolerated. Aggressive secondary prevention of coronary artery disease.   Nelva Bush, MD [facility]   Coronary Diagrams   Diagnostic Dominance: Right Intervention           Echocardiogram 08/10/2021  1. Left ventricular ejection fraction, by estimation, is 25 to 30%. The  left ventricle has severely decreased function. The left ventricle  demonstrates global hypokinesis with severe hypokinesis of the anterior,  septal, and apical region. Inferior wall  best preserved. Left ventricular diastolic parameters are consistent with  Grade II diastolic dysfunction (pseudonormalization). The average left  ventricular global longitudinal strain is -6.3 %. The global longitudinal  strain is abnormal.   2. Right ventricular systolic function is moderately reduced. The right  ventricular size is moderately enlarged. There is mildly elevated  pulmonary artery systolic  pressure. The estimated right  ventricular  systolic pressure is 50.0 mmHg.   3. Left atrial size was moderately dilated.   4. The mitral valve is normal in structure. Moderate to severe mitral  valve regurgitation.   5. Tricuspid valve regurgitation is mild to moderate.   6. Aortic dilatation noted. There is mild dilatation of the aortic root,  measuring 39 mm.   7. The inferior vena cava is dilated in size with <50% respiratory  variability, suggesting right atrial pressure of 15 mmHg.    EKG:  EKG is  ordered today.  The ekg ordered today demonstrates NSR, 97bpm, RAD, LVH  Recent Labs: 08/16/2021: Hemoglobin 12.1; Magnesium 1.8; Platelets 220 08/17/2021: ALT 37 08/20/2021: BUN 21; Creatinine, Ser 1.07; Potassium 4.6; Sodium 133 09/25/2021: BNP 1,438.3  Recent Lipid Panel    Component Value Date/Time   CHOL 53 08/10/2021 0643   TRIG 54 08/10/2021 0643   HDL 13 (L) 08/10/2021 0643   CHOLHDL 4.1 08/10/2021 0643   VLDL 11 08/10/2021 0643   LDLCALC 29 08/10/2021 0643   LDLDIRECT 62.0 10/04/2016 0942     Physical Exam:    VS:  BP 130/72 (BP Location: Left Arm, Patient Position: Sitting, Cuff Size: Normal)   Pulse 97   Ht _0  (1.803 m)   Wt 176 lb (79.8 kg)   SpO2 91%   BMI 24.55 kg/m     Wt Readings from Last 3 Encounters:  09/25/21 176 lb (79.8 kg)  08/29/21 178 lb 6 oz (80.9 kg)  08/15/21 202 lb 13.2 oz (92 kg)     GEN:  Well nourished, well developed in no acute distress HEENT: Normal NECK: No JVD; No carotid bruits LYMPHATICS: No lymphadenopathy CARDIAC: RRR, +murmur, no rubs, gallops RESPIRATORY:  Clear to auscultation without rales, wheezing or rhonchi  ABDOMEN: Soft, non-tender, non-distended MUSCULOSKELETAL:  minimal lower extremity edema; No deformity  SKIN: Warm and dry NEUROLOGIC:  Alert and oriented x 3 PSYCHIATRIC:  Normal affect   ASSESSMENT:    1. Coronary artery disease of native artery of native heart with stable angina pectoris ([place])   2.  Primary hypertension   3. Ischemic cardiomyopathy   4. Chronic HFrEF (heart failure with reduced ejection fraction) ([place])   5. Hyperlipidemia, mixed   6. PAD (peripheral artery disease) ([place])   7. Mitral valve disease   8. CKD (chronic kidney disease), symptom management only, stage 3 (moderate) ([facility])    PLAN:    In order of problems listed above:  NSTEMI/CAD s/p PCI ostial LAD with atherectomy  Presented to [facility]19 with CHF. BNP and troponin elevated. EKG showed septal infarct with inferior T wave changes. Echo showed worsening EF 20-25% (prior was 35-40% 11/2020). He eventually underwent repeat heart cath showing unchanged anatomy from heart cath 11/2020 (details of cath above).  He was transferred to [facility] for high risk PCI. Underwent successful Impella supported high risk PCI of ostial LAD with atherectomy and DES x1.  Recommendations for DAPT with ASA/Plavix for at least 1 year, preferably longer. Cath site, right groin, healed completely. He is doing PT at home, using a walker for L knee pain from fluid accumulation. Will defer cardiac rehab given he is dong at home PT. He denies anginal symptoms. Continue ASA, Plavix, statin, BB  Acute on chronic HFrEF ICM Echo during admission showed decline in EF 20-25% with global HK. Continue Toprol and spironolactone. Allergy to Jardiance. Lower leg edema on exam. Increase lasix 66m BID x 3 days then back down to  22m daily. BNP today. Start Entresto 24-296mID and BMET in a week.  Acute respiratory failure Breathing is back to baseline.   HLD LDL 29. Continue Lipitor.   PAD Continue ASA and statin. Lower anatomy reviewed on recent cardiac cath.   CKD stage 3 BMET in a week with initiation of Entresto.   Moderate to severe MR Echo 07/2021 with moderate to severe MR. Can arrange TEE at follow-up   Disposition: Follow up in 1 month(s) with MD/APP    Signed, Mahathi Pokorney H Ninfa MeekerPA-C  09/26/2021 1:52 PM    [facility]  HeartCare

## 2021-09-26 ENCOUNTER — Telehealth: Payer: Self-pay | Admitting: Cardiovascular Disease

## 2021-09-26 LAB — BRAIN NATRIURETIC PEPTIDE: BNP: 1438.3 pg/mL — ABNORMAL HIGH (ref 0.0–100.0)

## 2021-09-26 NOTE — Telephone Encounter (Signed)
Was able to reach back out to pt's daughter Cadence (DPR approved) she reports pt was placed on Gabapentin yesterday by Dr. Lucky Cowboy with vascular and wanted to make sure it was okay to take with his current list of medications and with the new prescribe yesterday by Cadence, Kathlen Mody, PAC; Entresto and Lasix.   Daughter has several questions regarding pt's medication and interactions with cardiac meds, would like to discuss and review, advised would be best for pharmacy consult, number given to PharmD so she may call. Asked that she wait to call to give pharmacy time to review pt's chart and medication, Cadence verbalized understanding and thankful for the number.  Also advised Mr. Pesqueira will be having surgery Monday on his knee. Otherwise all questions were address to best of ability and no additional concerns at this time. Agreeable to plan to call pharmacy to review medications, and will call back for anything further.

## 2021-09-26 NOTE — Telephone Encounter (Signed)
   [facility] Pre-operative Risk Assessment    Patient Name: PRATYUSH AMMON  DOB: 1933-07-07 MRN: [id number]  HEARTCARE STAFF:  - IMPORTANT!!!!!! Under Visit Info/Reason for Call, type in Other and utilize the format Clearance MM/DD/YY or Clearance TBD. Do not use dashes or single digits. - Please review there is not already an duplicate clearance open for this procedure. - If request is for dental extraction, please clarify the # of teeth to be extracted. - If the patient is currently at the dentist's office, call Pre-Op Callback Staff (MA/nurse) to input urgent request.  - If the patient is not currently in the dentist office, please route to the Pre-Op pool.  Request for surgical clearance:  What type of surgery is being performed? RT knee poly echange  When is this surgery scheduled? 10/01/21  What type of clearance is required (medical clearance vs. Pharmacy clearance to hold med vs. Both)? both  Are there any medications that need to be held prior to surgery and how long? Plavix instructions   Practice name and name of physician performing surgery? [facility] - Dr Kurtis Bushman  What is the office phone number? [phone]   7.   What is the office fax number? [phone]  8.   Anesthesia type (None, local, MAC, general) ? Local/spinal    Ace Gins 09/26/2021, 2:35 PM  _________________________________________________________________   (provider comments below)

## 2021-09-26 NOTE — Telephone Encounter (Signed)
   Name: Henry Lindsey  DOB: 1933/04/11  MRN: [id number]   Primary Cardiologist: Ida Rogue, MD  Chart reviewed as part of pre-operative protocol coverage.   Pt was recently hospitalized with acute on chronic systolic heart failure, NSTEMI treated with high risk atherectomy requiring mechanical support, and moderate to severe mitral regurgitation (07/2021). Pt will be on DAPT with ASA and plavix uninterrupted for 12 months.    I will route this recommendation to the requesting party via Epic fax function and remove from pre-op pool. Please call with questions.  Tami Lin Jaiyla Granados, PA 09/26/2021, 2:48 PM

## 2021-09-26 NOTE — Telephone Encounter (Signed)
Please call to discuss Gabapentin. Patient's daughter would like to know how often a day he should be taking this medication.

## 2021-09-27 ENCOUNTER — Other Ambulatory Visit
Admission: RE | Admit: 2021-09-27 | Discharge: 2021-09-27 | Disposition: A | Discharge status: Home / Self Care | Payer: Medicare Other | Source: Ambulatory Visit | Attending: Orthopedic Surgery | Admitting: Orthopedic Surgery

## 2021-09-27 ENCOUNTER — Other Ambulatory Visit: Payer: Self-pay

## 2021-09-27 DIAGNOSIS — I214 Non-ST elevation (NSTEMI) myocardial infarction: Secondary | ICD-10-CM | POA: Insufficient documentation

## 2021-09-27 DIAGNOSIS — I129 Hypertensive chronic kidney disease with stage 1 through stage 4 chronic kidney disease, or unspecified chronic kidney disease: Secondary | ICD-10-CM | POA: Insufficient documentation

## 2021-09-27 DIAGNOSIS — Z1152 Encounter for screening for COVID-19: Secondary | ICD-10-CM | POA: Diagnosis not present

## 2021-09-27 DIAGNOSIS — I10 Essential (primary) hypertension: Secondary | ICD-10-CM

## 2021-09-27 DIAGNOSIS — Z01818 Encounter for other preprocedural examination: Secondary | ICD-10-CM | POA: Insufficient documentation

## 2021-09-27 DIAGNOSIS — Z01812 Encounter for preprocedural laboratory examination: Secondary | ICD-10-CM

## 2021-09-27 DIAGNOSIS — N1831 Chronic kidney disease, stage 3a: Secondary | ICD-10-CM

## 2021-09-27 HISTORY — DX: Acute myocardial infarction, unspecified: I21.9

## 2021-09-27 HISTORY — DX: Pneumonia, unspecified organism: J18.9

## 2021-09-27 HISTORY — DX: Angina pectoris, unspecified: I20.9

## 2021-09-27 HISTORY — DX: Cardiac murmur, unspecified: R01.1

## 2021-09-27 LAB — PROTIME-INR
INR: 1.3 — ABNORMAL HIGH (ref 0.8–1.2)
Prothrombin Time: 15.7 seconds — ABNORMAL HIGH (ref 11.4–15.2)

## 2021-09-27 LAB — URINALYSIS, ROUTINE W REFLEX MICROSCOPIC
Bilirubin Urine: NEGATIVE
Glucose, UA: NEGATIVE mg/dL
Hgb urine dipstick: NEGATIVE
Ketones, ur: NEGATIVE mg/dL
Leukocytes,Ua: NEGATIVE
Nitrite: NEGATIVE
Protein, ur: NEGATIVE mg/dL
Specific Gravity, Urine: 1.015 (ref 1.005–1.030)
pH: 6 (ref 5.0–8.0)

## 2021-09-27 LAB — CBC
HCT: 35 % — ABNORMAL LOW (ref 39.0–52.0)
Hemoglobin: 10.6 g/dL — ABNORMAL LOW (ref 13.0–17.0)
MCH: 24.8 pg — ABNORMAL LOW (ref 26.0–34.0)
MCHC: 30.3 g/dL (ref 30.0–36.0)
MCV: 82 fL (ref 80.0–100.0)
Platelets: 244 10*3/uL (ref 150–400)
RBC: 4.27 MIL/uL (ref 4.22–5.81)
RDW: 19.7 % — ABNORMAL HIGH (ref 11.5–15.5)
WBC: 6.1 10*3/uL (ref 4.0–10.5)
nRBC: 0 % (ref 0.0–0.2)

## 2021-09-27 LAB — TYPE AND SCREEN
ABO/RH(D): O NEG
Antibody Screen: NEGATIVE

## 2021-09-27 LAB — SURGICAL PCR SCREEN
MRSA, PCR: NEGATIVE
Staphylococcus aureus: POSITIVE — AB

## 2021-09-27 LAB — APTT: aPTT: 33 seconds (ref 24–36)

## 2021-09-27 NOTE — Patient Instructions (Addendum)
Your procedure is scheduled on: 10/01/21 - Monday Report to the Registration Desk on the 1st floor of the [facility]. To find out your arrival time, please call [phone] between 1PM - 3PM on: 09/28/21   REMEMBER: Instructions that are not followed completely may result in serious medical risk, up to and including death; or upon the discretion of your surgeon and anesthesiologist your surgery may need to be rescheduled.  Do not eat food after midnight the night before surgery.  No gum chewing, lozengers or hard candies.  You may however, drink CLEAR liquids up to 2 hours before you are scheduled to arrive for your surgery. Do not drink anything within 2 hours of your scheduled arrival time.  Type 1 and Type 2 diabetics should only drink water.  TAKE THESE MEDICATIONS THE MORNING OF SURGERY WITH A SIP OF WATER:  - cephALEXin (KEFLEX) 500 MG capsule  - sulfamethoxazole-trimethoprim (BACTRIM DS) 800-160 MG tablet  - timolol (TIMOPTIC) 0.5 % ophthalmic solution  - tamsulosin (FLOMAX) 0.4 MG CAPS capsule  - atorvastatin (LIPITOR) 40 MG tablet  - gabapentin (NEURONTIN) 400 MG capsule  - metoprolol succinate (TOPROL-XL) 50 MG 24 hr tablet  - pantoprazole (PROTONIX) 40 MG tablet, take one the night before and one on the morning of surgery - helps to prevent nausea after surgery.  Use albuterol (VENTOLIN HFA) 108 (90 Base) MCG/ACT inhaler on the day of surgery and bring to the hospital.  Follow recommendations from Cardiologist, Pulmonologist or PCP regarding stopping Aspirin, Coumadin, Plavix, Eliquis, Pradaxa, or Pletal.  - insulin aspart protamine- aspart (NOVOLOG MIX 70/30) (70-30) 100 UNIT/ML injection- Take no insulin the morning of your surgery, may resume with blood sugar checks and meals as directed.  One week prior to surgery: Stop Anti-inflammatories (NSAIDS) such as Advil, Aleve, Ibuprofen, Motrin, Naproxen, Naprosyn and Aspirin based products such as Excedrin, Goodys  Powder, BC Powder.  Stop ANY OVER THE COUNTER supplements until after surgery.  You may however, continue to take Tylenol if needed for pain up until the day of surgery.  No Alcohol for 24 hours before or after surgery.  No Smoking including e-cigarettes for 24 hours prior to surgery.  No chewable tobacco products for at least 6 hours prior to surgery.  No nicotine patches on the day of surgery.  Do not use any "recreational" drugs for at least a week prior to your surgery.  Please be advised that the combination of cocaine and anesthesia may have negative outcomes, up to and including death. If you test positive for cocaine, your surgery will be cancelled.  On the morning of surgery brush your teeth with toothpaste and water, you may rinse your mouth with mouthwash if you wish. Do not swallow any toothpaste or mouthwash.  Use CHG Soap or wipes as directed on instruction sheet.  Do not wear jewelry, make-up, hairpins, clips or nail polish.  Do not wear lotions, powders, or perfumes.   Do not shave body from the neck down 48 hours prior to surgery just in case you cut yourself which could leave a site for infection.  Also, freshly shaved skin may become irritated if using the CHG soap.  Contact lenses, hearing aids and dentures may not be worn into surgery.  Do not bring valuables to the hospital. [facility] is not responsible for any missing/lost belongings or valuables.   Notify your doctor if there is any change in your medical condition (cold, fever, infection).  Wear comfortable clothing (specific to  your surgery type) to the hospital.  After surgery, you can help prevent lung complications by doing breathing exercises.  Take deep breaths and cough every 1-2 hours. Your doctor may order a device called an Incentive Spirometer to help you take deep breaths. When coughing or sneezing, hold a pillow firmly against your incision with both hands. This is called "splinting."  Doing this helps protect your incision. It also decreases belly discomfort.  If you are being admitted to the hospital overnight, leave your suitcase in the car. After surgery it may be brought to your room.  If you are being discharged the day of surgery, you will not be allowed to drive home. You will need a responsible adult (18 years or older) to drive you home and stay with you that night.   If you are taking public transportation, you will need to have a responsible adult (18 years or older) with you. Please confirm with your physician that it is acceptable to use public transportation.   Please call the [facility] at [phone] if you have any questions about these instructions.  Surgery Visitation Policy:  Patients undergoing a surgery or procedure may have one family member or support person with them as long as that person is not COVID-19 positive or experiencing its symptoms.  That person may remain in the waiting area during the procedure and may rotate out with other people.  Inpatient Visitation:    Visiting hours are 7 a.m. to 8 p.m. Up to two visitors ages 16+ are allowed at one time in a patient room. The visitors may rotate out with other people during the day. Visitors must check out when they leave, or other visitors will not be allowed. One designated support person may remain overnight. The visitor must pass COVID-19 screenings, use hand sanitizer when entering and exiting the patient's room and wear a mask at all times, including in the patient's room. Patients must also wear a mask when staff or their visitor are in the room. Masking is required regardless of vaccination status.

## 2021-09-27 NOTE — Telephone Encounter (Addendum)
Honor Loh, NP, reached out to clarify clearance per Angie Duke below - wanted to ensure this meant that clearance was denied. Per Angie's note, "Pt was recently hospitalized with acute on chronic systolic heart failure, NSTEMI treated with high risk atherectomy requiring mechanical support, and moderate to severe mitral regurgitation (07/2021). Pt will be on DAPT with ASA and plavix uninterrupted for 12 months."   Originally it was thought this surgery was elective but Gaspar Bidding did reach out to notify the status has been upgraded to "emergent" due to infection. Will route high priority to Dr. Rockey Situ for input and also send a secure chat as this requires MD input given recent NSTEMI with fresh PCI 08/14/21.

## 2021-09-27 NOTE — Pre-Procedure Instructions (Signed)
Patient here for pre-admission appointment related to surgery for infection to right knee. This Probation officer reviewed medications with patient, instructions reviewed with patient including typed list of medications to take the morning of surgery, patient voiced to this writer that he will not be taking any of his medication the morning of surgery, I explained to him the importance of taking medications listed. Patient did not respond nor did he agree to follow instructions. Cardiac clearance is pending with orders on his Aspirin , Plavix and Pletal. Honor Loh NP is aware and following/monitoring.

## 2021-09-28 ENCOUNTER — Other Ambulatory Visit: Payer: Medicare Other

## 2021-09-28 ENCOUNTER — Telehealth: Payer: Self-pay | Admitting: *Deleted

## 2021-09-28 ENCOUNTER — Encounter: Payer: Self-pay | Admitting: Orthopedic Surgery

## 2021-09-28 LAB — SARS CORONAVIRUS 2 (TAT 6-24 HRS): SARS Coronavirus 2: NEGATIVE

## 2021-09-28 NOTE — Telephone Encounter (Signed)
-----   Message from [place], PA-C sent at 09/28/2021  1:19 PM EST ----- Labs showed elevated BNP (volume overload). Lets do lasix 40mg  for 5 days instead of 3. Patient may neem long-term higher maintenance dose. HE should have upcoming BMET

## 2021-09-28 NOTE — Telephone Encounter (Signed)
   Patient Name: Henry Lindsey  DOB: [birth year] MRN: [id number]  Primary Cardiologist: Ida Rogue, MD  Chart reviewed as part of pre-operative protocol coverage. Case was reviewed in secure chat with Dr. Rockey Situ and Dr. Fletcher Anon, Honor Loh. Per Dr. Fletcher Anon, "Not safe to discontinue dual antiplatelet therapy at this point. Options include doing the surgery while he is taking both aspirin and clopidogrel. If it is felt that the risk of bleeding is too high, then recommend stopping clopidogrel and admit to the hospital to bridge with cangrelor."  Honor Loh, NP reviewed with surgeon Dr. Harlow Mares, and per their communication, "He said patient MUST have surgery. Knee is badly infected. Plans are to see him the office Monday morning for a "preop" exam, and then send him directly to [facility]. Patient will continue DAPT medications, holding only on the morning of surgery, with plans to take postoperatively." Gaspar Bidding asked, "While not an ideal situation by any means, would the cardiology service line be ok with proceeding with the caveat being that the patient is at [facility] for perioperative cardiovascular complications?"   Dr. Fletcher Anon replied, "Yes I think it is very reasonable to proceed with surgery understanding his increased risk.  Given his recent LAD revascularization, his cardiac risk should be less than before.  Agree with continuing DAPT.  Thanks."  Will route this bundled recommendation to requesting provider via Epic fax function. Please call with questions.  Charlie Pitter, PA-C 09/28/2021, 11:55 AM

## 2021-09-28 NOTE — Telephone Encounter (Signed)
Pt's daughter [name] left message [phone] to make sure none of his meds are "in competition with another." Pt has 30 medications on his list. Returned call to pt's daughter.  On insulin for DM. Side effects with Jardiance and metoprolol.  On Toprol, Entresto, spironolactone, Lasix for his HFrEF. Atorvastatin, Plavix and ASA for CAD/CVA, Pletal for PAD.  On multiple pain meds - gabapentin, Vicodin, Flexeril, Voltaren gel.  Keflex and Bactrim added to med list 12/7. Daughter reports pt had fluid drawn from knee and it was infected, has surgery coming up on Monday.  Pletal is contraindicated in patients with HF. He is also already on DAPT, also doesn't need 3 antiplatelet medications. PCP has been prescribing Pletal. Advised pt to stop taking this. Looks like inpt cards notes from most recent hospitalization actually mention this but the med was never removed from his med list and pt continued to take. Pt's daughter will make sure pt stops taking this.   Her other main question is on the dosing of gabapentin. Rx is for 400mg  TID but after recent hospitalization, med list was updated to BID dosing. This looks like med rec error as I cannot find anything mentioned in notes for dose decrease. Advised daughter to follow up with pt and see if his neuropathy has been well controlled on BID dosing and if not, to increase back to TID dosing as prescribed. Pt has CKD listed on his PMH but his SCr is normal at 0.8 and CrCl is 61mL/min. Gabapentin max recommend doses is 1,800mg  for CrCl 50-79 which pt falls within for either BID or TID dosing.  Daughter was appreciative for the phone call and assistance.

## 2021-09-28 NOTE — Addendum Note (Signed)
Addended by: Yasaman Kolek E on: 09/28/2021 02:01 PM   Modules accepted: Orders

## 2021-09-28 NOTE — Telephone Encounter (Signed)
Spoke with pt. Notified of lab results and provider's recc.  Pt voiced understanding.  He will incr Lasix 40 mg for 5 days instead of 3 days then resume 20 mg daily thereafter at this time.  Pt was scheduled for follow up BMET next week 10/02/21 but pt cancelled due to scheduling conflicts next week.  Pt states that he will call back to reschedule repeat BMET early next week.  Forwarding to triage to follow up with pt's repeat BMET.

## 2021-09-28 NOTE — Progress Notes (Signed)
Perioperative Services  Pre-Admission/Anesthesia Testing Clinical Review  Date: 09/28/21  Patient Demographics:  Name: Henry Lindsey DOB:   15-Nov-1932 MRN:   [id number]  Planned Surgical Procedure(s):    Case: [id number] Date/Time: 10/01/21 1638   Procedure: TOTAL KNEE REVISION (Right: Knee)   Anesthesia type: General   Pre-op diagnosis:      G99.242 Presence of right artificial knee joint     T84.59XA Infect/inflm reaction due to oth internal joint prosth, init   Location: [facility] OR ROOM 02 / [place] ORS FOR ANESTHESIA GROUP   Surgeons: Lovell Sheehan, MD   NOTE: Available PAT nursing documentation and vital signs have been reviewed. Clinical nursing staff has updated patient's PMH/PSHx, current medication list, and drug allergies/intolerances to ensure comprehensive history available to assist in medical decision making as it pertains to the aforementioned surgical procedure and anticipated anesthetic course. Extensive review of available clinical information performed. [place] PMH and PSHx updated with any diagnoses/procedures that  may have been inadvertently omitted during his intake with the pre-admission testing department's nursing staff.  Clinical Discussion:  Henry Lindsey is a 87 y.o. male who is submitted for pre-surgical anesthesia review and clearance prior to him undergoing the above procedure. Patient is a Former Smoker (16.5 pack years; quit 04/1968). Pertinent PMH includes: CAD, NSTEMI, ischemic cardiomyopathy, HFrEF, angina, cardiac murmur, PVD, carotid artery stenosis, aortic atherosclerosis, CVA, HTN, HLD, T2DM, COPD, GERD (on daily PPI), OA, BPH.  Patient is followed by cardiology Rockey Situ, MD). He was last seen in the cardiology clinic on 09/25/2021; notes reviewed.  At the time of his clinic visit, patient doing well overall from a cardiovascular perspective.  Patient being seen in hospital follow-up following recent admission.  Since his discharge from the hospital,  patient denied any episodes of chest pain, shortness of breath, PND, orthopnea, palpitations, vertiginous symptoms, or presyncope/syncope.  Patient with significant peripheral edema on exam.  PMH significant for cardiovascular diagnoses.  Patient noted to have a 95% occlusion of his RIGHT SFA back in 06/2012.  He underwent PTA.  Last ABI study was performed in 03/2021; RIGHT 1.02 and LEFT 0.96.  Patient with a history of CAROTID artery stenosis.  Carotid Doppler study performed in 11/2010 revealed less than 50% BILATERAL ICA stenoses.  Repeat study performed in 03/2021 revealed 40 to 59% stenoses of the BILATERAL ICAs.  Patient underwent diagnostic right/left heart catheterization on 11/24/2020 revealing a reduced left ventricular systolic function with an EF of 35-45%.  There was multivessel CAD; 60% ostial to mid LM, 70% ostial LAD, 100% proximal to distal RCA, 50% proximal LAD, 35% distal LAD, 80% D1, and 70% OM2.  RVSP 47, mean PA pressure 31, PCWP 17, CO 5.05 L/min, and CI 2.49 L/min/m.  Findings consistent with mild pulmonary hypertension.    TTE performed on 08/10/2021 revealed severely decreased left ventricular systolic function with an EF of 25-30%.  There was severe global hypokinesis.  Diastolic parameters consistent with pseudonormalization (G2DD).  GLS -6.3%.  Left atrium moderately enlarged.  There was moderate to severe mitral regurgitation and mild to moderate tricuspid valve regurgitation.  Aortic root dilated at 39 mm.  Repeat diagnostic right/left heart catheterization performed on 08/14/2021 revealing severe multivessel CAD; 30% mid to distal LM, 85% ostial LAD, 50% proximal LAD, 35% distal LAD, 80% D1, 50% proximal LCx, 70% OM1, and 100% proximal to distal RCA.  RVSP 63, mean PA pressure 38, PCWP 25, CO 3.5 L/min, and CI 1.6 L/min/m.  Given the complexity of  his coronary anatomy, patient was transferred to [facility] for consideration of high risk PCI.  Patient underwent Impella  supported high risk PCI at [facility] on 08/15/2021.  A 2.75 x 26 mm Onyx frontier DES x1 was placed to the ostial LAD with a 1-2 mm extension into the left main coronary artery to assure coverage of the ostium.  LVEDP elevated at 24 mmHg.  Following stent placement, patient remains on daily DAPT therapy (ASA + clopidogrel); compliant with therapy with no evidence or reports of GI bleeding.  Blood pressure is well controlled at 130/72 on currently prescribed diuretic, beta-blocker, therapies.  Patient previously on SGLT2i, however developed a rash requiring that medication be discontinued.  Patient is on a statin for his HLD.  T2DM managed with insulin reasonably controlled; last Hgb A1c was 7.0% when checked on 09/26/2021.  Patient has PRN nitrates (NTG) to use for chest pain; denies recent use. Functional capacity limited by underlying cardiopulmonary diagnoses, age-related debility, and current arthritides/knee infection.  Patient is therefore unable to achieve 4 METS of activity.  Following recent NSTEMI, patient working with physical therapy at home.  Given increased peripheral edema, patient's daily furosemide dose increased and he was started on ARB/ANRi Delene Loll).  No other changes were made to his medication regimen.  Patient to follow-up with outpatient cardiology in 1 month or sooner.   Henry Lindsey is scheduled for an TOTAL KNEE REVISION (Right: Knee) on 10/01/2021 with Dr. Kurtis Bushman, MD. Given patient's past medical history significant for cardiovascular diagnoses, presurgical cardiac clearance was sought by the PAT team. As previously mentioned, patient status post NSTEMI with PCI in 07/2021. I have spoken directly with cardiology who advises that patient cannot come off of his DAPT medications this soon. Reviewed that case was being deemed emergent; options dicussed. After reviewing available options with surgery, the decision was made to proceed with patient remaining on his DAPT  medications. Clearance issued by Fletcher Anon, MD as follows, "based ACC/AHA guidelines, the patient's past medical history, and the amount of time since his last clinic visit, this patient would be at an INCREASED risk for the planned procedure without further cardiovascular testing or intervention at this time.  Given his recent LAD revascularization, his cardiac risk should be less than before."  Patient denies previous perioperative complications with anesthesia in the past. In review of the available records, it is noted that patient underwent a general anesthetic course here (ASA III) in 07/2020 without documented complications.   Vitals with BMI 09/25/2021 08/29/2021 08/20/2021  Height _0  _1  -  Weight 176 lbs 178 lbs 6 oz -  BMI 36.62 94.76 -  Systolic 546 503 -  Diastolic 72 74 -  Pulse 97 84 71    Providers/Specialists:   NOTE: Primary physician provider listed below. Patient may have been seen by APP or partner within same practice.   PROVIDER ROLE / SPECIALTY LAST Jayme Cloud, MD Orthopedics 09/21/2021  Baxter Hire, MD Primary Care Provider 04/10/2021  Ida Rogue, MD Cardiology 09/25/2021   Allergies:  Jardiance [empagliflozin], Metformin and related, and Trazodone  Current Home Medications:   No current facility-administered medications for this encounter.    albuterol (VENTOLIN HFA) 108 (90 Base) MCG/ACT inhaler   ascorbic acid (VITAMIN C) 500 MG tablet   aspirin EC 81 MG tablet   atorvastatin (LIPITOR) 40 MG tablet   cephALEXin (KEFLEX) 500 MG capsule   cholecalciferol (VITAMIN D) 25 MCG (1000 UNIT) tablet  cilostazol (PLETAL) 100 MG tablet   clopidogrel (PLAVIX) 75 MG tablet   cyclobenzaprine (FLEXERIL) 5 MG tablet   diclofenac Sodium (VOLTAREN) 1 % GEL   furosemide (LASIX) 20 MG tablet   gabapentin (NEURONTIN) 400 MG capsule   HYDROcodone-acetaminophen (NORCO/VICODIN) 5-325 MG tablet   insulin aspart protamine- aspart (NOVOLOG MIX 70/30)  (70-30) 100 UNIT/ML injection   latanoprost (XALATAN) 0.005 % ophthalmic solution   metoprolol succinate (TOPROL-XL) 50 MG 24 hr tablet   Multiple Vitamin (MULTIVITAMIN WITH MINERALS) TABS tablet   nitroGLYCERIN (NITROSTAT) 0.4 MG SL tablet   pantoprazole (PROTONIX) 40 MG tablet   sacubitril-valsartan (ENTRESTO) 24-26 MG   spironolactone (ALDACTONE) 25 MG tablet   sulfamethoxazole-trimethoprim (BACTRIM DS) 800-160 MG tablet   tamsulosin (FLOMAX) 0.4 MG CAPS capsule   timolol (TIMOPTIC) 0.5 % ophthalmic solution   vitamin B-12 (CYANOCOBALAMIN) 500 MCG tablet   vitamin E 180 MG (400 UNITS) capsule   Blood Glucose Monitoring Suppl (ONE TOUCH ULTRA SYSTEM KIT) w/Device KIT   INS SYRINGE/NEEDLE 1CC/28G (B-D INSULIN SYRINGE 1CC/28G) 28G X 1/2" 1 ML MISC   ONE TOUCH ULTRA TEST test strip   ONETOUCH DELICA LANCETS 56L MISC   History:   Past Medical History:  Diagnosis Date   Anginal pain (HCC)    Aortic atherosclerosis (HCC)    Arthritis    BPH (benign prostatic hyperplasia)    CAD (coronary artery disease)    a.) 2/22 Cath: LM 50d, LAD sev ost dz, mod-sev prox/mid dz. D1 sev dz. LCX mild prox dzs, OM2 mod-sev dz, RCA 100p. RPL and RPDA fill via L-R collats. EF 35%. Seen by CVTS->Med mgmt. b.) R/LHC 08/14/21: 30% m-d-LM, 85% oLAD, 50% pLAD, 35% dLAD, 80% D1, 50% pLCx, 70% OM1, 100% p-dRCA -> trans to [facility]. c.) 08/15/21 Impella supported HIGH RISK PCI with atherectomy. 2.75 x 37m Onyx Frontier DES oLAD.   Carotid artery stenosis    a. 11/2010 U/S: <50% bilaterally; b. 03/2021 40-59% bilat ICA stenoses.   COPD (chronic obstructive pulmonary disease) ([facility]    noted CT 10/05/16 former smoker quit age 87   CVA (cerebral infarction) 11/2010   a.) RIGHT thalamic lacunar   GERD (gastroesophageal reflux disease)    Heart murmur    HFrEF (heart failure with reduced ejection fraction) ([facility]    a. 10/2020 Echo: EF 35%.   Hyperlipidemia    Hypertension    Ischemic cardiomyopathy    a. 10/2020 Echo:  EF 35%. Nl RVSP. Mod MR. Mild TR; b. 11/2020 Cath:  CO/CI 5.05/2.49. LV gram: EF 35%.   Lower extremity cellulitis    NSTEMI (non-ST elevated myocardial infarction) ([facility] 08/09/2021   a.) R/LHC 08/14/2021: 30% m-d-LM, 85% oLAD, 50% pLAD, 35% dLAD, 80% D1, 50% pLCx, 70% OM1, 100% p-dRCA -> transfer to [facility]. b.) 08/15/2021 Impella supported HIGH RISK PCI with atherectomy. 2.75 x 251mOnyx Frontier DES to [facility]  Peripheral vascular disease in diabetes mellitus ([facility]   a. 06/2012 95% occlusion s/p PTA R SFA (Dr. DeLucky Cowboy b. 03/2021 ABIs: R 1.02, L 0.96.   Pneumonia    Type 2 diabetes mellitus [facility] with insulin [facility]   Past Surgical History:  Procedure Laterality Date   ABDOMINAL AORTOGRAM N/A 08/14/2021   Procedure: ABDOMINAL AORTOGRAM;  Surgeon: EnNelva BushMD;  [facility];  Service: Cardiovascular;  Laterality: N/A;   CHOLECYSTECTOMY N/A 11/13/2016   Procedure: LAPAROSCOPIC CHOLECYSTECTOMY WITH INTRAOPERATIVE CHOLANGIOGRAM;  Surgeon: JoOlean ReeMD;  Location: [facility] ORS;  Service:  General;  Laterality: N/A;   COLONOSCOPY WITH PROPOFOL N/A 02/10/2018   Procedure: COLONOSCOPY WITH PROPOFOL;  Surgeon: Lucilla Lame, MD;  Location: [facility] ENDOSCOPY;  Service: Endoscopy;  Laterality: N/A;   CORONARY ATHERECTOMY N/A 08/15/2021   Procedure: CORONARY ATHERECTOMY;  Surgeon: Wellington Hampshire, MD;  Location: [place] CV LAB;  Service: Cardiovascular;  Laterality: N/A;   CORONARY STENT INTERVENTION W/IMPELLA N/A 08/15/2021   Procedure: Coronary Stent Intervention w/Impella;  Surgeon: Wellington Hampshire, MD;  Location: [place] CV LAB;  Service: Cardiovascular;  Laterality: N/A;   ERCP N/A 11/17/2016   Procedure: ENDOSCOPIC RETROGRADE CHOLANGIOPANCREATOGRAPHY (ERCP);  Surgeon: Irene Shipper, MD;  Location: [facility] ENDOSCOPY;  Service: Endoscopy;  Laterality: N/A;   ERCP N/A 02/04/2017   Procedure: ENDOSCOPIC RETROGRADE CHOLANGIOPANCREATOGRAPHY (ERCP) Stent removal;  Surgeon: Lucilla Lame,  MD;  Location: [facility] ENDOSCOPY;  Service: Endoscopy;  Laterality: N/A;   IR GENERIC HISTORICAL  10/06/2016   IR PERC CHOLECYSTOSTOMY 10/06/2016 Aletta Edouard, MD [facility]-INTERV RAD   JOINT REPLACEMENT Left 2014   left knee   RIGHT/LEFT HEART CATH AND CORONARY ANGIOGRAPHY N/A 11/24/2020   Procedure: RIGHT/LEFT HEART CATH AND CORONARY ANGIOGRAPHY;  Surgeon: Minna Merritts, MD;  Location: [place] CV LAB;  Service: Cardiovascular;  Laterality: N/A;   RIGHT/LEFT HEART CATH AND CORONARY ANGIOGRAPHY N/A 08/14/2021   Procedure: RIGHT/LEFT HEART CATH AND CORONARY ANGIOGRAPHY;  Surgeon: Nelva Bush, MD;  Location: [place] CV LAB;  Service: Cardiovascular;  Laterality: N/A;   TOTAL KNEE ARTHROPLASTY Right 08/08/2020   Procedure: TOTAL KNEE ARTHROPLASTY;  Surgeon: Lovell Sheehan, MD;  Location: [facility] ORS;  Service: Orthopedics;  Laterality: Right;   UPPER GASTROINTESTINAL ENDOSCOPY     WRIST SURGERY     Family History  Problem Relation Age of Onset   Diabetes Mother    Social History   Tobacco Use   Smoking status: Former    Packs/day: 0.50    Years: 33.00    Pack years: 16.50    Types: Cigarettes    Quit date: 04/21/1968    Years since quitting: 53.4   Smokeless tobacco: Never  Vaping Use   Vaping Use: Never used  Substance Use Topics   Alcohol use: No   Drug use: No    Pertinent Clinical Results:  LABS: Labs reviewed: Acceptable for surgery.  Hospital Outpatient Visit on 09/27/2021  Component Date Value Ref Range Status   MRSA, PCR 09/27/2021 NEGATIVE  NEGATIVE Final   Staphylococcus aureus 09/27/2021 POSITIVE (A)  NEGATIVE Final   Comment: (NOTE) The Xpert SA Assay (FDA approved for NASAL specimens in patients 99 years of age and older), is one component of a comprehensive surveillance program. It is not intended to diagnose infection nor to guide or monitor treatment. Performed at [facility], [address] [place], [place] [address]    WBC  09/27/2021 6.1  4.0 - 10.5 K/uL Final   RBC 09/27/2021 4.27  4.22 - 5.81 MIL/uL Final   Hemoglobin 09/27/2021 10.6 (L)  13.0 - 17.0 g/dL Final   HCT 09/27/2021 35.0 (L)  39.0 - 52.0 % Final   MCV 09/27/2021 82.0  80.0 - 100.0 fL Final   MCH 09/27/2021 24.8 (L)  26.0 - 34.0 pg Final   MCHC 09/27/2021 30.3  30.0 - 36.0 g/dL Final   RDW 09/27/2021 19.7 (H)  11.5 - 15.5 % Final   Platelets 09/27/2021 244  150 - 400 K/uL Final   nRBC 09/27/2021 0.0  0.0 - 0.2 % Final  Performed at [facility], [address] [place], [place] [address]   aPTT 09/27/2021 33  24 - 36 seconds Final   Performed at [facility], [address] [place], [place] [address]   Prothrombin Time 09/27/2021 15.7 (H)  11.4 - 15.2 seconds Final   INR 09/27/2021 1.3 (H)  0.8 - 1.2 Final   Comment: (NOTE) INR goal varies based on device and disease states. Performed at [facility], [place], [place] [address]    Color, Urine 09/27/2021 YELLOW  YELLOW Final   APPearance 09/27/2021 CLEAR  CLEAR Final   Specific Gravity, Urine 09/27/2021 1.015  1.005 - 1.030 Final   pH 09/27/2021 6.0  5.0 - 8.0 Final   Glucose, UA 09/27/2021 NEGATIVE  NEGATIVE mg/dL Final   Hgb urine dipstick 09/27/2021 NEGATIVE  NEGATIVE Final   Bilirubin Urine 09/27/2021 NEGATIVE  NEGATIVE Final   Ketones, ur 09/27/2021 NEGATIVE  NEGATIVE mg/dL Final   Protein, ur 09/27/2021 NEGATIVE  NEGATIVE mg/dL Final   Nitrite 09/27/2021 NEGATIVE  NEGATIVE Final   Leukocytes,Ua 09/27/2021 NEGATIVE  NEGATIVE Final   Comment: Microscopic not done on urines with negative protein, blood, leukocytes, nitrite, or glucose < 500 mg/dL. Performed at [facility], [address] [place], [place] [address]    ABO/RH(D) 09/27/2021 O NEG   Final   Antibody Screen 09/27/2021 NEG   Final   Sample Expiration 09/27/2021 10/11/2021,2359   Final   Extend sample reason 09/27/2021    Final                   Value:NO  TRANSFUSIONS OR PREGNANCY IN THE PAST 3 MONTHS Performed at [facility], [address] [place], [place] [address]    SARS Coronavirus 2 09/27/2021 NEGATIVE  NEGATIVE Final   Comment: (NOTE) SARS-CoV-2 target nucleic acids are NOT DETECTED    ECG: Date: 08/24/2021 Time ECG obtained: 1312 PM Rate: 75 bpm Rhythm:  SR with occasional PVCs ; NSIVCD Axis (leads I and aVF): Right axis deviation Intervals: PR 182 ms. QRS 126 ms. QTc 486 ms. ST segment and T wave changes: No evidence of acute ST segment elevation or depression. Evidence of age undetermined septal infarct present.  Comparison: Similar to previous tracing obtained on 08/16/2021; inferior TWIs present.    IMAGING / PROCEDURES: DIAGNOSTIC RADIOGRAPHS OF CHEST 2 VIEWS performed on 08/16/2021 Cardiomegaly. Central pulmonary vessels are prominent without signs of pulmonary edema. Bilateral pleural effusions, more so on the right side. Linear densities in right parahilar region suggests more prominent suggesting worsening subsegmental atelectasis.  CORONARY ATHERECTOMY performed on 08/15/2021 LVEDP 24 mmHg at the beginning of the case Multivessel CAD 30% stenosis of the mid to distal LM 85% stenosis of the ostial LAD 50% stenosis of the proximal LAD 35% stenosis of the distal LAD 50% stenosis of the proximal LCx 100% stenosis proximal distal RCA 80% stenosis of the D1 70% stenosis of the OM1 Successful Impella supported high risk PCI 2.75 x 26 Onyx Frontier DES x1 placed to the ostial LAD.  Stent extended 1-2 mm into the left main coronary artery to ensure coverage of the ostium Atherectomy of the ostial LAD performed   RIGHT/LEFT HEART CATHETERIZATION AND CORONARY ANGIOGRAPHY performed on 08/14/2021 Severe multivessel coronary artery disease, including 80-90% ostial LAD stenosis and chronic total occlusion of the proximal RCA, a seen on prior catheterization in 11/2020. Moderately elevated left heart, right  heart, and pulmonary artery pressures. Moderately-severely reduced cardiac output/index.  Ectatic abdominal aorta common/external iliac arteries, and common femoral arteries without significant disease.  Focal 70% right internal iliac artery stenosis is noted. 50% ostial stenosis of right renal artery Recommendations: Transfer to [facility] for consideration of high risk PCI to LMCA/ostial LAD.  Given heavy calcification and reduced LVEF, atherectomy and Impella support will need to be considered.  I will load the patient with clopidogrel 600 mg today, followed by 75 mg daily thereafter. Maintain net even to slightly negative fluid balance following contrast exposure today and chronic kidney disease. Escalate goal-directed medical therapy of acute on chronic HFrEF, as tolerated. Aggressive secondary prevention of coronary artery disease.   TRANSTHORACIC ECHOCARDIOGRAM performed on 08/10/2021 Left ventricular ejection fraction, by estimation, is 25 to 30%. The left ventricle has severely decreased function. The left ventricle  demonstrates global hypokinesis with severe hypokinesis of the anterior, septal, and apical region. Inferior wall best preserved. Left ventricular diastolic parameters are consistent with Grade II diastolic dysfunction (pseudonormalization). The average left ventricular global longitudinal strain is -6.3 %. The global longitudinal strain is abnormal.  Right ventricular systolic function is moderately reduced. The right ventricular size is moderately enlarged. There is mildly elevated pulmonary artery systolic pressure. The estimated right ventricular systolic pressure is 37.9 mmHg.  Left atrial size was moderately dilated.  The mitral valve is normal in structure. Moderate to severe mitral valve regurgitation.  Tricuspid valve regurgitation is mild to moderate.  Aortic dilatation noted. There is mild dilatation of the aortic root, measuring 39 mm.  The inferior vena cava is  dilated in size with <50% respiratory variability, suggesting right atrial pressure of 15 mmHg.   Impression and Plan:  Henry Lindsey has been referred for pre-anesthesia review and clearance prior to him undergoing the planned anesthetic and procedural courses. Available labs, pertinent testing, and imaging results were personally reviewed by me. This patient has been appropriately cleared by cardiology with an overall INCREASED risk of significant perioperative cardiovascular complications.  Based on clinical review performed today (09/28/21), barring any significant acute changes in the patient's overall condition, it is anticipated that he will be able to proceed with the planned surgical intervention. Any acute changes in clinical condition may necessitate his procedure being postponed and/or cancelled. Patient will meet with anesthesia team (MD and/or CRNA) on the day of his procedure for preoperative evaluation/assessment. Questions regarding anesthetic course will be fielded at that time.   Pre-surgical instructions were reviewed with the patient during his PAT appointment and questions were fielded by PAT clinical staff. Patient was advised that if any questions or concerns arise prior to his procedure then he should return a call to PAT and/or his surgeon's office to discuss.  Honor Loh, MSN, APRN, FNP-C, CEN [facility]  Peri-operative Services Nurse Practitioner Phone: [phone] Fax: [phone] 09/28/21 11:43 AM  NOTE: This note has been prepared using Dragon dictation software. Despite my best ability to proofread, there is always the potential that unintentional transcriptional errors may still occur from this process.

## 2021-09-30 MED ORDER — ORAL CARE MOUTH RINSE: 15.0000 mL | Freq: Once | OROMUCOSAL | Status: AC

## 2021-09-30 MED ORDER — CHLORHEXIDINE GLUCONATE 0.12 % MT SOLN: 15.0000 mL | Freq: Once | OROMUCOSAL | Status: AC

## 2021-09-30 MED ORDER — SODIUM CHLORIDE 0.9 % IV SOLN: INTRAVENOUS | Status: DC

## 2021-10-01 ENCOUNTER — Inpatient Hospital Stay: Payer: Medicare Other | Admitting: Urgent Care

## 2021-10-01 ENCOUNTER — Encounter: Payer: Self-pay | Admitting: Orthopedic Surgery

## 2021-10-01 ENCOUNTER — Inpatient Hospital Stay
Admission: RE | Admit: 2021-10-01 | Discharge: 2021-10-04 | DRG: 486 | Disposition: A | Discharge status: Home Health | Payer: Medicare Other | Attending: Orthopedic Surgery | Admitting: Orthopedic Surgery

## 2021-10-01 ENCOUNTER — Other Ambulatory Visit: Payer: Self-pay | Admitting: Orthopedic Surgery

## 2021-10-01 ENCOUNTER — Other Ambulatory Visit: Payer: Self-pay

## 2021-10-01 ENCOUNTER — Encounter
Admission: RE | Disposition: A | Discharge status: Home Health | Payer: Self-pay | Source: Home / Self Care | Attending: Orthopedic Surgery

## 2021-10-01 DIAGNOSIS — I110 Hypertensive heart disease with heart failure: Secondary | ICD-10-CM | POA: Diagnosis present

## 2021-10-01 DIAGNOSIS — E785 Hyperlipidemia, unspecified: Secondary | ICD-10-CM | POA: Diagnosis present

## 2021-10-01 DIAGNOSIS — K219 Gastro-esophageal reflux disease without esophagitis: Secondary | ICD-10-CM | POA: Diagnosis present

## 2021-10-01 DIAGNOSIS — Z955 Presence of coronary angioplasty implant and graft: Secondary | ICD-10-CM | POA: Diagnosis not present

## 2021-10-01 DIAGNOSIS — I878 Other specified disorders of veins: Secondary | ICD-10-CM | POA: Diagnosis present

## 2021-10-01 DIAGNOSIS — D6489 Other specified anemias: Secondary | ICD-10-CM | POA: Diagnosis present

## 2021-10-01 DIAGNOSIS — Z87891 Personal history of nicotine dependence: Secondary | ICD-10-CM

## 2021-10-01 DIAGNOSIS — Z8673 Personal history of transient ischemic attack (TIA), and cerebral infarction without residual deficits: Secondary | ICD-10-CM

## 2021-10-01 DIAGNOSIS — I2510 Atherosclerotic heart disease of native coronary artery without angina pectoris: Secondary | ICD-10-CM | POA: Diagnosis present

## 2021-10-01 DIAGNOSIS — Z96659 Presence of unspecified artificial knee joint: Secondary | ICD-10-CM | POA: Diagnosis not present

## 2021-10-01 DIAGNOSIS — Z833 Family history of diabetes mellitus: Secondary | ICD-10-CM

## 2021-10-01 DIAGNOSIS — B9561 Methicillin susceptible Staphylococcus aureus infection as the cause of diseases classified elsewhere: Secondary | ICD-10-CM | POA: Diagnosis present

## 2021-10-01 DIAGNOSIS — J449 Chronic obstructive pulmonary disease, unspecified: Secondary | ICD-10-CM | POA: Diagnosis present

## 2021-10-01 DIAGNOSIS — Z20822 Contact with and (suspected) exposure to covid-19: Secondary | ICD-10-CM | POA: Diagnosis present

## 2021-10-01 DIAGNOSIS — T8453XA Infection and inflammatory reaction due to internal right knee prosthesis, initial encounter: Principal | ICD-10-CM | POA: Diagnosis present

## 2021-10-01 DIAGNOSIS — Y831 Surgical operation with implant of artificial internal device as the cause of abnormal reaction of the patient, or of later complication, without mention of misadventure at the time of the procedure: Secondary | ICD-10-CM | POA: Diagnosis present

## 2021-10-01 DIAGNOSIS — N400 Enlarged prostate without lower urinary tract symptoms: Secondary | ICD-10-CM | POA: Diagnosis present

## 2021-10-01 DIAGNOSIS — I252 Old myocardial infarction: Secondary | ICD-10-CM

## 2021-10-01 DIAGNOSIS — T8459XA Infection and inflammatory reaction due to other internal joint prosthesis, initial encounter: Secondary | ICD-10-CM | POA: Diagnosis not present

## 2021-10-01 DIAGNOSIS — A4901 Methicillin susceptible Staphylococcus aureus infection, unspecified site: Secondary | ICD-10-CM | POA: Diagnosis not present

## 2021-10-01 DIAGNOSIS — I255 Ischemic cardiomyopathy: Secondary | ICD-10-CM | POA: Diagnosis present

## 2021-10-01 DIAGNOSIS — I5022 Chronic systolic (congestive) heart failure: Secondary | ICD-10-CM | POA: Diagnosis present

## 2021-10-01 DIAGNOSIS — Z794 Long term (current) use of insulin: Secondary | ICD-10-CM

## 2021-10-01 DIAGNOSIS — Z96651 Presence of right artificial knee joint: Secondary | ICD-10-CM

## 2021-10-01 DIAGNOSIS — E1151 Type 2 diabetes mellitus with diabetic peripheral angiopathy without gangrene: Secondary | ICD-10-CM | POA: Diagnosis present

## 2021-10-01 HISTORY — DX: Atherosclerosis of aorta: I70.0

## 2021-10-01 HISTORY — DX: Type 2 diabetes mellitus without complications: E11.9

## 2021-10-01 HISTORY — PX: TOTAL KNEE REVISION: SHX996

## 2021-10-01 LAB — GLUCOSE, CAPILLARY
Glucose-Capillary: 112 mg/dL — ABNORMAL HIGH (ref 70–99)
Glucose-Capillary: 142 mg/dL — ABNORMAL HIGH (ref 70–99)
Glucose-Capillary: 135 mg/dL — ABNORMAL HIGH (ref 70–99)

## 2021-10-01 SURGERY — TOTAL KNEE REVISION
Anesthesia: General | Site: Knee | Laterality: Right

## 2021-10-01 MED ORDER — PHENYLEPHRINE HCL (PRESSORS) 10 MG/ML IV SOLN
INTRAVENOUS | Status: DC | PRN
  Administered 2021-10-01 (×3): 80 ug via INTRAVENOUS

## 2021-10-01 MED ORDER — FENTANYL CITRATE (PF) 100 MCG/2ML IJ SOLN
INTRAMUSCULAR | Status: DC | PRN
  Administered 2021-10-01 (×4): 50 ug via INTRAVENOUS

## 2021-10-01 MED ORDER — SODIUM CHLORIDE 0.9 % IR SOLN
Status: DC | PRN
  Administered 2021-10-01: 3000 mL

## 2021-10-01 MED ORDER — CYCLOBENZAPRINE HCL 10 MG PO TABS
5.0000 mg | ORAL_TABLET | Freq: Every day | ORAL | Status: DC | PRN
Start: 2021-10-01 — End: 2021-10-04
  Administered 2021-10-03: 22:00:00 10 mg via ORAL
  Filled 2021-10-01 (×2): qty 1

## 2021-10-01 MED ORDER — POVIDONE-IODINE 10 % EX SWAB: 2.0000 "application " | Freq: Once | CUTANEOUS | Status: DC

## 2021-10-01 MED ORDER — TRANEXAMIC ACID 1000 MG/10ML IV SOLN
INTRAVENOUS | Status: AC
  Filled 2021-10-01: qty 10

## 2021-10-01 MED ORDER — GABAPENTIN 400 MG PO CAPS
400.0000 mg | ORAL_CAPSULE | Freq: Two times a day (BID) | ORAL | Status: DC
  Administered 2021-10-01 – 2021-10-04 (×6): 400 mg via ORAL
  Filled 2021-10-01 (×6): qty 1

## 2021-10-01 MED ORDER — CHLORHEXIDINE GLUCONATE 0.12 % MT SOLN
OROMUCOSAL | Status: AC
  Administered 2021-10-01: 15 mL via OROMUCOSAL
  Filled 2021-10-01: qty 15

## 2021-10-01 MED ORDER — NEOMYCIN-POLYMYXIN B GU 40-200000 IR SOLN
Status: DC | PRN
  Administered 2021-10-01: 4 mL

## 2021-10-01 MED ORDER — OXYCODONE HCL 5 MG/5ML PO SOLN: 5.0000 mg | Freq: Once | ORAL | Status: DC | PRN

## 2021-10-01 MED ORDER — HYDROCODONE-ACETAMINOPHEN 7.5-325 MG PO TABS
1.0000 | ORAL_TABLET | ORAL | Status: DC | PRN
  Administered 2021-10-02: 2 via ORAL
  Administered 2021-10-02 (×2): 1 via ORAL
  Administered 2021-10-02: 2 via ORAL
  Administered 2021-10-03 – 2021-10-04 (×3): 1 via ORAL
  Filled 2021-10-01: qty 2
  Filled 2021-10-01 (×5): qty 1
  Filled 2021-10-01: qty 2
  Filled 2021-10-01 (×2): qty 1

## 2021-10-01 MED ORDER — ACETAMINOPHEN 325 MG PO TABS
325.0000 mg | ORAL_TABLET | Freq: Four times a day (QID) | ORAL | Status: DC | PRN
  Filled 2021-10-01: qty 2

## 2021-10-01 MED ORDER — BUPIVACAINE HCL (PF) 0.5 % IJ SOLN
INTRAMUSCULAR | Status: AC
  Filled 2021-10-01: qty 30

## 2021-10-01 MED ORDER — DIPHENHYDRAMINE HCL 12.5 MG/5ML PO ELIX: 12.5000 mg | ORAL_SOLUTION | ORAL | Status: DC | PRN

## 2021-10-01 MED ORDER — TRANEXAMIC ACID 1000 MG/10ML IV SOLN
2000.0000 mg | INTRAVENOUS | Status: DC
  Filled 2021-10-01: qty 20

## 2021-10-01 MED ORDER — SPIRONOLACTONE 25 MG PO TABS
12.5000 mg | ORAL_TABLET | Freq: Every day | ORAL | Status: DC
  Administered 2021-10-02 – 2021-10-04 (×3): 12.5 mg via ORAL
  Filled 2021-10-01 (×2): qty 0.5
  Filled 2021-10-01 (×2): qty 1
  Filled 2021-10-01: qty 0.5
  Filled 2021-10-01: qty 1

## 2021-10-01 MED ORDER — ETOMIDATE 2 MG/ML IV SOLN
INTRAVENOUS | Status: DC | PRN
  Administered 2021-10-01: 2 mg via INTRAVENOUS
  Administered 2021-10-01: 10 mg via INTRAVENOUS

## 2021-10-01 MED ORDER — MENTHOL 3 MG MT LOZG
1.0000 | LOZENGE | OROMUCOSAL | Status: DC | PRN
  Filled 2021-10-01: qty 9

## 2021-10-01 MED ORDER — TAMSULOSIN HCL 0.4 MG PO CAPS
0.4000 mg | ORAL_CAPSULE | Freq: Every day | ORAL | Status: DC
  Administered 2021-10-02 – 2021-10-04 (×3): 0.4 mg via ORAL
  Filled 2021-10-01 (×3): qty 1

## 2021-10-01 MED ORDER — TIMOLOL MALEATE 0.5 % OP SOLN
1.0000 [drp] | Freq: Every morning | OPHTHALMIC | Status: DC
  Administered 2021-10-02 – 2021-10-04 (×3): 1 [drp] via OPHTHALMIC
  Filled 2021-10-01: qty 5

## 2021-10-01 MED ORDER — CLOPIDOGREL BISULFATE 75 MG PO TABS
75.0000 mg | ORAL_TABLET | Freq: Every day | ORAL | Status: DC
  Administered 2021-10-02 – 2021-10-04 (×3): 75 mg via ORAL
  Filled 2021-10-01 (×3): qty 1

## 2021-10-01 MED ORDER — BUPIVACAINE LIPOSOME 1.3 % IJ SUSP
INTRAMUSCULAR | Status: DC | PRN
  Administered 2021-10-01: 50 mL

## 2021-10-01 MED ORDER — VITAMIN B-12 1000 MCG PO TABS
500.0000 ug | ORAL_TABLET | Freq: Every day | ORAL | Status: DC
  Administered 2021-10-02 – 2021-10-04 (×3): 500 ug via ORAL
  Filled 2021-10-01 (×3): qty 1

## 2021-10-01 MED ORDER — BUPIVACAINE LIPOSOME 1.3 % IJ SUSP
INTRAMUSCULAR | Status: AC
  Filled 2021-10-01: qty 20

## 2021-10-01 MED ORDER — CEFAZOLIN SODIUM-DEXTROSE 2-4 GM/100ML-% IV SOLN
INTRAVENOUS | Status: AC
  Filled 2021-10-01: qty 100

## 2021-10-01 MED ORDER — ACETAMINOPHEN 10 MG/ML IV SOLN: 1000.0000 mg | Freq: Once | INTRAVENOUS | Status: DC | PRN

## 2021-10-01 MED ORDER — LATANOPROST 0.005 % OP SOLN
1.0000 [drp] | Freq: Every day | OPHTHALMIC | Status: DC
  Administered 2021-10-01 – 2021-10-03 (×3): 1 [drp] via OPHTHALMIC
  Filled 2021-10-01: qty 2.5

## 2021-10-01 MED ORDER — METOCLOPRAMIDE HCL 5 MG/ML IJ SOLN: 5.0000 mg | Freq: Three times a day (TID) | INTRAMUSCULAR | Status: DC | PRN

## 2021-10-01 MED ORDER — NITROGLYCERIN 0.4 MG SL SUBL: 0.4000 mg | SUBLINGUAL_TABLET | SUBLINGUAL | Status: DC | PRN

## 2021-10-01 MED ORDER — ONDANSETRON HCL 4 MG/2ML IJ SOLN
INTRAMUSCULAR | Status: AC
  Filled 2021-10-01: qty 2

## 2021-10-01 MED ORDER — ROCURONIUM BROMIDE 100 MG/10ML IV SOLN
INTRAVENOUS | Status: DC | PRN
  Administered 2021-10-01: 50 mg via INTRAVENOUS

## 2021-10-01 MED ORDER — INSULIN ASPART 100 UNIT/ML IJ SOLN
0.0000 [IU] | Freq: Three times a day (TID) | INTRAMUSCULAR | Status: DC
  Administered 2021-10-02 (×2): 2 [IU] via SUBCUTANEOUS
  Administered 2021-10-02 – 2021-10-03 (×2): 3 [IU] via SUBCUTANEOUS
  Administered 2021-10-03: 09:00:00 2 [IU] via SUBCUTANEOUS
  Administered 2021-10-03: 18:00:00 3 [IU] via SUBCUTANEOUS
  Administered 2021-10-04: 5 [IU] via SUBCUTANEOUS
  Filled 2021-10-01 (×7): qty 1

## 2021-10-01 MED ORDER — DOCUSATE SODIUM 100 MG PO CAPS
100.0000 mg | ORAL_CAPSULE | Freq: Two times a day (BID) | ORAL | Status: DC
  Administered 2021-10-01 – 2021-10-04 (×6): 100 mg via ORAL
  Filled 2021-10-01 (×6): qty 1

## 2021-10-01 MED ORDER — ATORVASTATIN CALCIUM 20 MG PO TABS
40.0000 mg | ORAL_TABLET | Freq: Every morning | ORAL | Status: DC
  Administered 2021-10-02 – 2021-10-04 (×3): 40 mg via ORAL
  Filled 2021-10-01: qty 2

## 2021-10-01 MED ORDER — OXYCODONE HCL 5 MG PO TABS
5.0000 mg | ORAL_TABLET | Freq: Once | ORAL | Status: AC
  Administered 2021-10-01: 5 mg via ORAL
  Filled 2021-10-01: qty 1

## 2021-10-01 MED ORDER — ACETAMINOPHEN 10 MG/ML IV SOLN
INTRAVENOUS | Status: AC
  Filled 2021-10-01: qty 100

## 2021-10-01 MED ORDER — FENTANYL CITRATE (PF) 100 MCG/2ML IJ SOLN
INTRAMUSCULAR | Status: AC
  Filled 2021-10-01: qty 2

## 2021-10-01 MED ORDER — ASPIRIN EC 81 MG PO TBEC
81.0000 mg | DELAYED_RELEASE_TABLET | Freq: Every day | ORAL | Status: DC
  Administered 2021-10-01 – 2021-10-04 (×4): 81 mg via ORAL
  Filled 2021-10-01 (×4): qty 1

## 2021-10-01 MED ORDER — DEXAMETHASONE SODIUM PHOSPHATE 10 MG/ML IJ SOLN
INTRAMUSCULAR | Status: AC
  Filled 2021-10-01: qty 1

## 2021-10-01 MED ORDER — MORPHINE SULFATE (PF) 2 MG/ML IV SOLN
0.5000 mg | INTRAVENOUS | Status: DC | PRN
  Administered 2021-10-01 – 2021-10-02 (×3): 1 mg via INTRAVENOUS
  Filled 2021-10-01 (×3): qty 1

## 2021-10-01 MED ORDER — INSULIN ASPART PROT & ASPART (70-30 MIX) 100 UNIT/ML ~~LOC~~ SUSP
15.0000 [IU] | Freq: Two times a day (BID) | SUBCUTANEOUS | Status: DC
  Administered 2021-10-02 – 2021-10-04 (×5): 15 [IU] via SUBCUTANEOUS
  Filled 2021-10-01: qty 10

## 2021-10-01 MED ORDER — ACETAMINOPHEN 10 MG/ML IV SOLN
INTRAVENOUS | Status: DC | PRN
  Administered 2021-10-01: 1000 mg via INTRAVENOUS

## 2021-10-01 MED ORDER — FUROSEMIDE 20 MG PO TABS
20.0000 mg | ORAL_TABLET | Freq: Every day | ORAL | Status: DC
  Administered 2021-10-01 – 2021-10-04 (×4): 20 mg via ORAL
  Filled 2021-10-01 (×4): qty 1

## 2021-10-01 MED ORDER — SODIUM CHLORIDE FLUSH 0.9 % IV SOLN
INTRAVENOUS | Status: AC
  Filled 2021-10-01: qty 40

## 2021-10-01 MED ORDER — ONDANSETRON HCL 4 MG/2ML IJ SOLN: 4.0000 mg | Freq: Once | INTRAMUSCULAR | Status: DC | PRN

## 2021-10-01 MED ORDER — ETOMIDATE 2 MG/ML IV SOLN
INTRAVENOUS | Status: AC
  Filled 2021-10-01: qty 10

## 2021-10-01 MED ORDER — ONDANSETRON HCL 4 MG/2ML IJ SOLN
INTRAMUSCULAR | Status: DC | PRN
  Administered 2021-10-01: 4 mg via INTRAVENOUS

## 2021-10-01 MED ORDER — TRANEXAMIC ACID 1000 MG/10ML IV SOLN
INTRAVENOUS | Status: DC | PRN
  Administered 2021-10-01: 1000 mg via TOPICAL

## 2021-10-01 MED ORDER — CEFAZOLIN SODIUM-DEXTROSE 2-4 GM/100ML-% IV SOLN
2.0000 g | INTRAVENOUS | Status: AC
  Administered 2021-10-01: 2 g via INTRAVENOUS

## 2021-10-01 MED ORDER — LACTATED RINGERS IV SOLN: INTRAVENOUS | Status: DC

## 2021-10-01 MED ORDER — SACUBITRIL-VALSARTAN 24-26 MG PO TABS
1.0000 | ORAL_TABLET | Freq: Two times a day (BID) | ORAL | Status: DC
  Administered 2021-10-02: 1 via ORAL
  Filled 2021-10-01 (×6): qty 1

## 2021-10-01 MED ORDER — METOPROLOL SUCCINATE ER 50 MG PO TB24
50.0000 mg | ORAL_TABLET | Freq: Every day | ORAL | Status: DC
  Administered 2021-10-01 – 2021-10-04 (×3): 50 mg via ORAL
  Filled 2021-10-01 (×4): qty 1

## 2021-10-01 MED ORDER — BISACODYL 10 MG RE SUPP: 10.0000 mg | Freq: Every day | RECTAL | Status: DC | PRN

## 2021-10-01 MED ORDER — ALUM & MAG HYDROXIDE-SIMETH 200-200-20 MG/5ML PO SUSP: 30.0000 mL | ORAL | Status: DC | PRN

## 2021-10-01 MED ORDER — PHENOL 1.4 % MT LIQD
1.0000 | OROMUCOSAL | Status: DC | PRN
  Filled 2021-10-01: qty 177

## 2021-10-01 MED ORDER — 0.9 % SODIUM CHLORIDE (POUR BTL) OPTIME
TOPICAL | Status: DC | PRN
  Administered 2021-10-01: 500 mL

## 2021-10-01 MED ORDER — ONDANSETRON HCL 4 MG/2ML IJ SOLN: 4.0000 mg | Freq: Four times a day (QID) | INTRAMUSCULAR | Status: DC | PRN

## 2021-10-01 MED ORDER — SUGAMMADEX SODIUM 200 MG/2ML IV SOLN
INTRAVENOUS | Status: DC | PRN
  Administered 2021-10-01: 180 mg via INTRAVENOUS

## 2021-10-01 MED ORDER — OXYCODONE HCL 5 MG PO TABS: 5.0000 mg | ORAL_TABLET | Freq: Once | ORAL | Status: DC | PRN

## 2021-10-01 MED ORDER — LIDOCAINE HCL (CARDIAC) PF 100 MG/5ML IV SOSY
PREFILLED_SYRINGE | INTRAVENOUS | Status: DC | PRN
  Administered 2021-10-01: 80 mg via INTRAVENOUS

## 2021-10-01 MED ORDER — NEOMYCIN-POLYMYXIN B GU 40-200000 IR SOLN
Status: AC
  Filled 2021-10-01: qty 4

## 2021-10-01 MED ORDER — FENTANYL CITRATE (PF) 100 MCG/2ML IJ SOLN
25.0000 ug | INTRAMUSCULAR | Status: DC | PRN
  Administered 2021-10-01: 25 ug via INTRAVENOUS

## 2021-10-01 MED ORDER — HYDROCODONE-ACETAMINOPHEN 5-325 MG PO TABS
1.0000 | ORAL_TABLET | ORAL | Status: DC | PRN
  Administered 2021-10-02 – 2021-10-03 (×3): 1 via ORAL
  Filled 2021-10-01 (×3): qty 1
  Filled 2021-10-01: qty 2

## 2021-10-01 MED ORDER — CEFAZOLIN SODIUM-DEXTROSE 2-4 GM/100ML-% IV SOLN
2.0000 g | Freq: Three times a day (TID) | INTRAVENOUS | Status: DC
  Administered 2021-10-01 – 2021-10-04 (×9): 2 g via INTRAVENOUS
  Filled 2021-10-01 (×9): qty 100

## 2021-10-01 MED ORDER — PANTOPRAZOLE SODIUM 40 MG PO TBEC
40.0000 mg | DELAYED_RELEASE_TABLET | Freq: Every day | ORAL | Status: DC
  Administered 2021-10-02 – 2021-10-04 (×3): 40 mg via ORAL
  Filled 2021-10-01 (×3): qty 1

## 2021-10-01 MED ORDER — LIDOCAINE HCL (PF) 2 % IJ SOLN
INTRAMUSCULAR | Status: AC
  Filled 2021-10-01: qty 5

## 2021-10-01 MED ORDER — ALBUTEROL SULFATE (2.5 MG/3ML) 0.083% IN NEBU: 3.0000 mL | INHALATION_SOLUTION | Freq: Four times a day (QID) | RESPIRATORY_TRACT | Status: DC | PRN

## 2021-10-01 MED ORDER — ONDANSETRON HCL 4 MG PO TABS: 4.0000 mg | ORAL_TABLET | Freq: Four times a day (QID) | ORAL | Status: DC | PRN

## 2021-10-01 MED ORDER — METOCLOPRAMIDE HCL 10 MG PO TABS: 5.0000 mg | ORAL_TABLET | Freq: Three times a day (TID) | ORAL | Status: DC | PRN

## 2021-10-01 MED ORDER — TRANEXAMIC ACID-NACL 1000-0.7 MG/100ML-% IV SOLN
INTRAVENOUS | Status: AC
  Filled 2021-10-01: qty 100

## 2021-10-01 SURGICAL SUPPLY — 58 items
BLADE SAGITTAL AGGR TOOTH XLG (BLADE) IMPLANT
BRUSH SCRUB EZ  4% CHG (MISCELLANEOUS) ×4
BRUSH SCRUB EZ 4% CHG (MISCELLANEOUS) ×2 IMPLANT
CHLORAPREP W/TINT 26 (MISCELLANEOUS) ×4 IMPLANT
CNTNR SPEC 2.5X3XGRAD LEK (MISCELLANEOUS) ×1
CONT SPEC 4OZ STER OR WHT (MISCELLANEOUS) ×1
CONT SPEC 4OZ STRL OR WHT (MISCELLANEOUS) ×1
CONTAINER SPEC 2.5X3XGRAD LEK (MISCELLANEOUS) IMPLANT
COOLER POLAR GLACIER W/PUMP (MISCELLANEOUS) ×2 IMPLANT
CUFF TOURN SGL QUICK 24 (TOURNIQUET CUFF) ×2
CUFF TOURN SGL QUICK 34 (TOURNIQUET CUFF)
CUFF TRNQT CYL 24X4X16.5-23 (TOURNIQUET CUFF) IMPLANT
CUFF TRNQT CYL 34X4.125X (TOURNIQUET CUFF) IMPLANT
DRAPE 3/4 80X56 (DRAPES) ×2 IMPLANT
DRAPE INCISE IOBAN 66X60 STRL (DRAPES) ×2 IMPLANT
DRSG AQUACEL AG ADV 3.5X14 (GAUZE/BANDAGES/DRESSINGS) ×1 IMPLANT
GAUZE 4X4 16PLY ~~LOC~~+RFID DBL (SPONGE) ×2 IMPLANT
GAUZE SPONGE 4X4 12PLY STRL (GAUZE/BANDAGES/DRESSINGS) ×1 IMPLANT
GAUZE XEROFORM 1X8 LF (GAUZE/BANDAGES/DRESSINGS) ×2 IMPLANT
GLOVE SURG ORTHO LTX SZ8 (GLOVE) ×4 IMPLANT
GLOVE SURG UNDER LTX SZ8 (GLOVE) ×2 IMPLANT
GOWN STRL REUS W/ TWL LRG LVL3 (GOWN DISPOSABLE) ×2 IMPLANT
GOWN STRL REUS W/ TWL XL LVL3 (GOWN DISPOSABLE) ×1 IMPLANT
GOWN STRL REUS W/TWL LRG LVL3 (GOWN DISPOSABLE) ×4
GOWN STRL REUS W/TWL XL LVL3 (GOWN DISPOSABLE) ×2
INSERT TIB XLPE 11 SZ5-6 (Insert) ×1 IMPLANT
KIT TURNOVER KIT A (KITS) ×2 IMPLANT
LABEL OR SOLS (LABEL) ×2 IMPLANT
MANIFOLD NEPTUNE II (INSTRUMENTS) ×2 IMPLANT
MAT ABSORB  FLUID 56X50 GRAY (MISCELLANEOUS) ×2
MAT ABSORB FLUID 56X50 GRAY (MISCELLANEOUS) ×1 IMPLANT
NDL SAFETY ECLIPSE 18X1.5 (NEEDLE) ×1 IMPLANT
NDL SPNL 20GX3.5 QUINCKE YW (NEEDLE) ×1 IMPLANT
NEEDLE HYPO 18GX1.5 SHARP (NEEDLE) ×2
NEEDLE SPNL 20GX3.5 QUINCKE YW (NEEDLE) ×2 IMPLANT
NS IRRIG 1000ML POUR BTL (IV SOLUTION) ×2 IMPLANT
PACK TOTAL KNEE (MISCELLANEOUS) ×2 IMPLANT
PAD ABD DERMACEA PRESS 5X9 (GAUZE/BANDAGES/DRESSINGS) ×2 IMPLANT
PAD DE MAYO PRESSURE PROTECT (MISCELLANEOUS) ×2 IMPLANT
PAD WRAPON POLAR KNEE (MISCELLANEOUS) ×2 IMPLANT
PULSAVAC PLUS IRRIG FAN TIP (DISPOSABLE) ×2
SOL .9 NS 3000ML IRR  AL (IV SOLUTION) ×2
SOL .9 NS 3000ML IRR UROMATIC (IV SOLUTION) IMPLANT
SPONGE T-LAP 18X18 ~~LOC~~+RFID (SPONGE) ×6 IMPLANT
STAPLER SKIN PROX 35W (STAPLE) ×2 IMPLANT
SUCTION FRAZIER HANDLE 10FR (MISCELLANEOUS) ×2
SUCTION TUBE FRAZIER 10FR DISP (MISCELLANEOUS) ×1 IMPLANT
SUT DVC 2 QUILL PDO  T11 36X36 (SUTURE) ×2
SUT DVC 2 QUILL PDO T11 36X36 (SUTURE) ×1 IMPLANT
SUT VIC AB 2-0 CT1 18 (SUTURE) ×2 IMPLANT
SUT VIC AB 2-0 CT1 27 (SUTURE) ×2
SUT VIC AB 2-0 CT1 TAPERPNT 27 (SUTURE) ×1 IMPLANT
SUT VIC AB PLUS 45CM 1-MO-4 (SUTURE) ×3 IMPLANT
SYR 10ML LL (SYRINGE) ×1 IMPLANT
SYR 30ML LL (SYRINGE) ×4 IMPLANT
TIP FAN IRRIG PULSAVAC PLUS (DISPOSABLE) ×1 IMPLANT
WATER STERILE IRR 500ML POUR (IV SOLUTION) ×2 IMPLANT
WRAPON POLAR PAD KNEE (MISCELLANEOUS) ×2

## 2021-10-01 NOTE — Transfer of Care (Signed)
Immediate Anesthesia Transfer of Care Note  Patient: Henry Lindsey  Procedure(s) Performed: IRRIGATION AND DEBRIDMENT RIGHT KNEE WITH POLY EXCHANGE (Right: Knee)  Patient Location: PACU  Anesthesia Type:General  Level of Consciousness: drowsy and patient cooperative  Airway & Oxygen Therapy: Patient Spontanous Breathing and Patient connected to face mask oxygen  Post-op Assessment: Report given to RN and Post -op Vital signs reviewed and stable  Post vital signs: Reviewed and stable  Last Vitals:  Vitals Value Taken Time  BP 117/76 10/01/21 1950  Temp    Pulse 98 10/01/21 1953  Resp 22 10/01/21 1953  SpO2 100 % 10/01/21 1953  Vitals shown include unvalidated device data.  Last Pain:  Vitals:   10/01/21 1636  TempSrc: Oral  PainSc: 0-No pain      Patients Stated Pain Goal: 0 (42/87/68 1157)  Complications: No notable events documented.

## 2021-10-01 NOTE — H&P (Signed)
Henry Lindsey MRN:  [id number] DOB/SEX:  09/10/1933/male  CHIEF COMPLAINT:  Painful right Knee  HISTORY: Patient is a 87 y.o. male presented with a history of pain in the right knee. Onset of symptoms was abrupt starting several weeks ago with gradually worsening course since that time. Prior procedures on the knee include arthroplasty. Patient has been treated conservatively with over-the-counter NSAIDs and activity modification. Patient currently rates pain in the knee at 10 out of 10 with activity. There is pain at night.  PAST MEDICAL HISTORY: Patient Active Problem List   Diagnosis Date Noted   Non-ST elevation (NSTEMI) myocardial infarction ([place]) 08/20/2021   Acute on chronic systolic (congestive) heart failure ([place]) 08/15/2021   Cardiomyopathy ([place]) 08/14/2021   NSTEMI (non-ST elevated myocardial infarction) [facility])    Demand ischemia ([place])    Acute on chronic HFrEF (heart failure with reduced ejection fraction) ([place]) 08/09/2021   Type II diabetes mellitus with renal manifestations ([place]) 08/09/2021   COPD (chronic obstructive pulmonary disease) (HCC)    CKD (chronic kidney disease), stage IIIa    Acute respiratory failure with hypoxia (HCC)    Elevated troponin    Abnormal LFTs    Swelling of limb 03/27/2021   Ischemic cardiomyopathy 11/24/2020   Unstable angina (HCC) 11/24/2020   Acute on chronic combined systolic and diastolic CHF (congestive heart failure) ([place]) 11/24/2020   Postoperative visit 08/22/2020   S/P TKR (total knee replacement) using cement, right 08/08/2020   Open wound of lower leg 05/29/2020   Plantar fasciitis 03/22/2020   Lumbar radiculopathy 07/05/2019   Chronic obstructive pulmonary disease ([place]) 06/11/2019   Gastroesophageal reflux disease without esophagitis 06/11/2019   Neuropathy 06/11/2019   Recurrent productive cough 03/27/2019   Chronic diarrhea    Benign neoplasm of transverse colon    Chronic iron deficiency anemia 10/09/2017   History of  diverticulitis 10/05/2017   S/P laparoscopic cholecystectomy 12/05/2016   Bile duct leak    Chest pain 10/05/2016   Pain in joint, ankle and foot 02/25/2015   Diabetic neuropathy ([place]) 10/09/2014   BPH with obstruction/lower urinary tract symptoms 10/09/2014   Bursitis of hip, right 04/05/2014   Glaucoma 04/05/2014   BPH (benign prostatic hyperplasia) 04/05/2014   Peripheral autonomic neuropathy due to DM ([place]) 12/28/2013   Vitamin D deficiency 11/03/2013   Incomplete emptying of bladder 08/04/2013   Benign localized hyperplasia of prostate with urinary obstruction and other lower urinary tract symptoms (LUTS)(600.21) 06/24/2013   Nocturia 06/24/2013   Orchalgia 06/24/2013   Impotence due to erectile dysfunction 06/01/2013   Routine general medical examination at a health care facility 04/04/2013   CAD (coronary artery disease) 07/21/2012   Peripheral vascular disease in diabetes mellitus ([place])    Carotid artery stenosis with cerebral infarction [facility])    History of basal cell carcinoma excision 08/05/2011   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/05/2011   Uncontrolled type 2 diabetes mellitus with diabetic polyneuropathy, with long-term current use of insulin 08/05/2011   Stroke ([place]) 08/05/2011   Hyperlipidemia    Hypertension    History of CVA (cerebrovascular accident)    Past Medical History:  Diagnosis Date   Anginal pain (HCC)    Aortic atherosclerosis (HCC)    Arthritis    BPH (benign prostatic hyperplasia)    CAD (coronary artery disease)    a.) 2/22 Cath: LM 50d, LAD sev ost dz, mod-sev prox/mid dz. D1 sev dz. LCX mild prox dzs, OM2 mod-sev dz, RCA 100p. RPL and RPDA fill via L-R collats.  EF 35%. Seen by CVTS->Med mgmt. b.) R/LHC 08/14/21: 30% m-d-LM, 85% oLAD, 50% pLAD, 35% dLAD, 80% D1, 50% pLCx, 70% OM1, 100% p-dRCA -> trans to [facility]. c.) 08/15/21 Impella supported HIGH RISK PCI with atherectomy. 2.75 x 41mm Onyx Frontier DES oLAD.   Carotid artery stenosis    a. 11/2010 U/S: <50%  bilaterally; b. 03/2021 40-59% bilat ICA stenoses.   COPD (chronic obstructive pulmonary disease) ([place])    noted CT 10/05/16 former smoker quit age 61    CVA (cerebral infarction) 11/2010   a.) RIGHT thalamic lacunar   GERD (gastroesophageal reflux disease)    Heart murmur    HFrEF (heart failure with reduced ejection fraction) ([place])    a. 10/2020 Echo: EF 35%.   Hyperlipidemia    Hypertension    Ischemic cardiomyopathy    a. 10/2020 Echo: EF 35%. Nl RVSP. Mod MR. Mild TR; b. 11/2020 Cath:  CO/CI 5.05/2.49. LV gram: EF 35%.   Lower extremity cellulitis    NSTEMI (non-ST elevated myocardial infarction) ([place]) 08/09/2021   a.) R/LHC 08/14/2021: 30% m-d-LM, 85% oLAD, 50% pLAD, 35% dLAD, 80% D1, 50% pLCx, 70% OM1, 100% p-dRCA -> transfer to [facility]. b.) 08/15/2021 Impella supported HIGH RISK PCI with atherectomy. 2.75 x 16mm Onyx Frontier DES to [facility].   Peripheral vascular disease in diabetes mellitus ([place])    a. 06/2012 95% occlusion s/p PTA R SFA (Dr. Lucky Cowboy); b. 03/2021 ABIs: R 1.02, L 0.96.   Pneumonia    Type 2 diabetes mellitus treated with insulin [facility])    Past Surgical History:  Procedure Laterality Date   ABDOMINAL AORTOGRAM N/A 08/14/2021   Procedure: ABDOMINAL AORTOGRAM;  Surgeon: Nelva Bush, MD;  Location: [place] [facility];  Service: Cardiovascular;  Laterality: N/A;   CHOLECYSTECTOMY N/A 11/13/2016   Procedure: LAPAROSCOPIC CHOLECYSTECTOMY WITH INTRAOPERATIVE CHOLANGIOGRAM;  Surgeon: Olean Ree, MD;  Location: [facility] ORS;  Service: General;  Laterality: N/A;   COLONOSCOPY WITH PROPOFOL N/A 02/10/2018   Procedure: COLONOSCOPY WITH PROPOFOL;  Surgeon: Lucilla Lame, MD;  Location: [facility] ENDOSCOPY;  Service: Endoscopy;  Laterality: N/A;   CORONARY ATHERECTOMY N/A 08/15/2021   Procedure: CORONARY ATHERECTOMY;  Surgeon: Wellington Hampshire, MD;  Location: [place] [facility];  Service: Cardiovascular;  Laterality: N/A;   CORONARY STENT INTERVENTION W/IMPELLA N/A 08/15/2021   Procedure:  Coronary Stent Intervention w/Impella;  Surgeon: Wellington Hampshire, MD;  Location: [place] [facility];  Service: Cardiovascular;  Laterality: N/A;   ERCP N/A 11/17/2016   Procedure: ENDOSCOPIC RETROGRADE CHOLANGIOPANCREATOGRAPHY (ERCP);  Surgeon: Irene Shipper, MD;  Location: [facility] ENDOSCOPY;  Service: Endoscopy;  Laterality: N/A;   ERCP N/A 02/04/2017   Procedure: ENDOSCOPIC RETROGRADE CHOLANGIOPANCREATOGRAPHY (ERCP) Stent removal;  Surgeon: Lucilla Lame, MD;  Location: [facility] ENDOSCOPY;  Service: Endoscopy;  Laterality: N/A;   IR GENERIC HISTORICAL  10/06/2016   IR PERC CHOLECYSTOSTOMY 10/06/2016 Aletta Edouard, MD [facility]-INTERV RAD   JOINT REPLACEMENT Left 2014   left knee   RIGHT/LEFT HEART CATH AND CORONARY ANGIOGRAPHY N/A 11/24/2020   Procedure: RIGHT/LEFT HEART CATH AND CORONARY ANGIOGRAPHY;  Surgeon: Minna Merritts, MD;  Location: [place] [facility];  Service: Cardiovascular;  Laterality: N/A;   RIGHT/LEFT HEART CATH AND CORONARY ANGIOGRAPHY N/A 08/14/2021   Procedure: RIGHT/LEFT HEART CATH AND CORONARY ANGIOGRAPHY;  Surgeon: Nelva Bush, MD;  Location: [place] [facility];  Service: Cardiovascular;  Laterality: N/A;   TOTAL KNEE ARTHROPLASTY Right 08/08/2020   Procedure: TOTAL KNEE ARTHROPLASTY;  Surgeon: Lovell Sheehan, MD;  Location: [facility]  ORS;  Service: Orthopedics;  Laterality: Right;   UPPER GASTROINTESTINAL ENDOSCOPY     WRIST SURGERY       MEDICATIONS:  (Not in a hospital admission)   ALLERGIES:   Allergies  Allergen Reactions   Jardiance [Empagliflozin]     Rash all over and lips turn purple.    Metformin And Related Diarrhea   Trazodone Other (See Comments)    Extreme hallucinations/ psychosis     REVIEW OF SYSTEMS:  Pertinent items are noted in HPI.   FAMILY HISTORY:   Family History  Problem Relation Age of Onset   Diabetes Mother     SOCIAL HISTORY:   Social History   Tobacco Use   Smoking status: Former    Packs/day: 0.50    Years: 33.00    Pack  years: 16.50    Types: Cigarettes    Quit date: 04/21/1968    Years since quitting: 53.4   Smokeless tobacco: Never  Substance Use Topics   Alcohol use: No     EXAMINATION:  Vital signs in last 24 hours: @VSRANGES @  General appearance: alert, cooperative, and no distress Neck: no JVD and supple, symmetrical, trachea midline Lungs: clear to auscultation bilaterally Heart: regular rate and rhythm, S1, S2 normal, no murmur, click, rub or gallop Abdomen: soft, non-tender; bowel sounds normal; no masses,  no organomegaly Extremities: edema RLE and Homans sign is negative, no sign of DVT Pulses: 2+ and symmetric Skin: Skin color, texture, turgor normal. No rashes or lesions  Musculoskeletal:  ROM 0-100, Ligaments intact,  Imaging Review Plain radiographs demonstrate components in good alignment of the right knee. The overall alignment is neutral. The bone quality appears to be good for age and reported activity level.  Assessment/Plan: Right knee infection  The patient history, physical examination and imaging studies are consistent with infection in right TKA. The patient has failed conservative treatment.  The clearance notes were reviewed.  After discussion with the patient it was felt that Right knee poly exchange and incision and drainage was indicated. The procedure,  risks, and benefits of total knee arthroplasty were presented and reviewed. The risks including but not limited to aseptic loosening, infection, blood clots, vascular injury, stiffness, patella tracking problems complications among others were discussed. The patient acknowledged the explanation, agreed to proceed with the plan.  Carlynn Spry 10/01/2021, 8:44 AM

## 2021-10-01 NOTE — Anesthesia Preprocedure Evaluation (Signed)
Anesthesia Evaluation  Patient identified by MRN, date of birth, ID band Patient awake  General Assessment Comment: Patient s/p NSTEMI, CHF exacerbation, and complex PCI performed 07/2021, with EF at that time shjowing 25% with moderate-severe MR. Supposed to be on 12 months of uninterrupted plavix.  Patient now with infected knee hardware. Surgeon Dr Harlow Mares deeming this case emergent, cannot wait 12 months of anticoagulation. Cardiologist has been contacted and agree per the notes that this type of urgent procedure cannot wait 12 months to be performed, and also cannot be performed on full anticoagulation.  Patient last took Plavix 48 hours ago.  Reviewed: Allergy & Precautions, NPO status , Patient's Chart, lab work & pertinent test results  History of Anesthesia Complications Negative for: history of anesthetic complications  Airway Mallampati: II  TM Distance: >3 FB Neck ROM: Full    Dental  (+) Poor Dentition, Chipped   Pulmonary neg sleep apnea, COPD, Patient abstained from smoking.Not current smoker, former smoker,    Pulmonary exam normal breath sounds clear to auscultation       Cardiovascular Exercise Tolerance: Poor METShypertension, pulmonary hypertension+ CAD, + Past MI, + Cardiac Stents, + Peripheral Vascular Disease and +CHF  (-) dysrhythmias + Valvular Problems/Murmurs MR  Rhythm:Regular Rate:Normal + Systolic murmurs ? Patient noted to have a 95% occlusion of his RIGHT SFA back in 06/2012.  He underwent PTA.  Last ABI study was performed in 03/2021; RIGHT 1.02 and LEFT 0.96.  ? Patient with a history of CAROTID artery stenosis.  Carotid Doppler study performed in 11/2010 revealed less than 50% BILATERAL ICA stenoses.  Repeat study performed in 03/2021 revealed 40 to 59% stenoses of the BILATERAL ICAs.  ? Patient underwent diagnostic right/left heart catheterization on 11/24/2020 revealing a reduced left ventricular  systolic function with an EF of 35-45%.  There was multivessel CAD; 60% ostial to mid LM, 70% ostial LAD, 100% proximal to distal RCA, 50% proximal LAD, 35% distal LAD, 80% D1, and 70% OM2.  RVSP 47, mean PA pressure 31, PCWP 17, CO 5.05 L/min, and CI 2.49 L/min/m.  Findings consistent with mild pulmonary hypertension.    ? TTE performed on 08/10/2021 revealed severely decreased left ventricular systolic function with an EF of 25-30%.  There was severe global hypokinesis.  Diastolic parameters consistent with pseudonormalization (G2DD).  GLS -6.3%.  Left atrium moderately enlarged.  There was moderate to severe mitral regurgitation and mild to moderate tricuspid valve regurgitation.  Aortic root dilated at 39 mm.  ? Repeat diagnostic right/left heart catheterization performed on 08/14/2021 revealing severe multivessel CAD; 30% mid to distal LM, 85% ostial LAD, 50% proximal LAD, 35% distal LAD, 80% D1, 50% proximal LCx, 70% OM1, and 100% proximal to distal RCA.  RVSP 63, mean PA pressure 38, PCWP 25, CO 3.5 L/min, and CI 1.6 L/min/m.  Given the complexity of his coronary anatomy, patient was transferred to [facility] for consideration of high risk PCI.  ? Patient underwent Impella supported high risk PCI at [facility] on 08/15/2021.  A 2.75 x 26 mm Onyx frontier DES x1 was placed to the ostial LAD with a 1-2 mm extension into the left main coronary artery to assure coverage of the ostium.  LVEDP elevated at 24 mmHg.   Neuro/Psych CVA negative psych ROS   GI/Hepatic GERD  ,(+)     (-) substance abuse  ,   Endo/Other  diabetes  Renal/GU CRFRenal disease     Musculoskeletal   Abdominal   Peds  Hematology   Anesthesia Other Findings Past Medical History: No date: Anginal pain ([place]) No date: Aortic atherosclerosis (HCC) No date: Arthritis No date: BPH (benign prostatic hyperplasia) No date: CAD (coronary artery disease)     Comment:  a.) 2/22 Cath: LM 50d, LAD sev ost dz,  mod-sev prox/mid               dz. D1 sev dz. LCX mild prox dzs, OM2 mod-sev dz, RCA               100p. RPL and RPDA fill via L-R collats. EF 35%. Seen by               CVTS->Med mgmt. b.) R/LHC 08/14/21: 30% m-d-LM, 85% oLAD,              50% pLAD, 35% dLAD, 80% D1, 50% pLCx, 70% OM1, 100%               p-dRCA -> trans to [facility]. c.) 08/15/21 Impella supported               HIGH RISK PCI with atherectomy. 2.75 x 34mm Onyx Frontier              DES oLAD. No date: Carotid artery stenosis     Comment:  a. 11/2010 U/S: <50% bilaterally; b. 03/2021 40-59% bilat               ICA stenoses. No date: COPD (chronic obstructive pulmonary disease) ([place])     Comment:  noted CT 10/05/16 former smoker quit age 40  11/2010: CVA (cerebral infarction)     Comment:  a.) RIGHT thalamic lacunar No date: GERD (gastroesophageal reflux disease) No date: Heart murmur No date: HFrEF (heart failure with reduced ejection fraction) ([place])     Comment:  a. 10/2020 Echo: EF 35%. No date: Hyperlipidemia No date: Hypertension No date: Ischemic cardiomyopathy     Comment:  a. 10/2020 Echo: EF 35%. Nl RVSP. Mod MR. Mild TR; b.               11/2020 Cath:  CO/CI 5.05/2.49. LV gram: EF 35%. No date: Lower extremity cellulitis 08/09/2021: NSTEMI (non-ST elevated myocardial infarction) [facility])     Comment:  a.) R/LHC 08/14/2021: 30% m-d-LM, 85% oLAD, 50% pLAD,               35% dLAD, 80% D1, 50% pLCx, 70% OM1, 100% p-dRCA ->               transfer to [facility]. b.) 08/15/2021 Impella supported HIGH               RISK PCI with atherectomy. 2.75 x 12mm Onyx Frontier DES               to [facility]. No date: Peripheral vascular disease in diabetes mellitus ([place])     Comment:  a. 06/2012 95% occlusion s/p PTA R SFA (Dr. Lucky Cowboy); b.               03/2021 ABIs: R 1.02, L 0.96. No date: Pneumonia No date: Type 2 diabetes mellitus treated with insulin (HCC)  Reproductive/Obstetrics                             Anesthesia  Physical Anesthesia Plan  ASA: 4 and emergent  Anesthesia Plan: General   Post-op Pain Management: Ofirmev IV (intra-op) and Dilaudid IV   Induction: Intravenous  PONV Risk Score and Plan: 3 and Ondansetron, Dexamethasone and Treatment may vary due to age or medical condition  Airway Management Planned: Oral ETT  Additional Equipment: Arterial line  Intra-op Plan:   Post-operative Plan: Extubation in OR and Possible Post-op intubation/ventilation  Informed Consent: I have reviewed the patients History and Physical, chart, labs and discussed the procedure including the risks, benefits and alternatives for the proposed anesthesia with the patient or authorized representative who has indicated his/her understanding and acceptance.     Dental advisory given  Plan Discussed with: CRNA and Surgeon  Anesthesia Plan Comments: (Not an ideal situation given recent NSTEMI and stent placement and need for uninterrupted anticoagulation, vs actively infected hardware necessitating surgical washout. I explained this delicate situation to hte patient, who is very pleasant and was able to say back to me the particulars of this case.  Active infection along with severe HFrEF and severe MR precluding spinal placement, active infection within similar region to adductor block also a relative contraindication to peripheral nerve block.  Discussed risks of anesthesia with patient, including PONV, sore throat, lip/dental/eye damage, post operative cognitive dysfunction. Rare risks discussed as well, such as cardiorespiratory and neurological sequelae, and allergic reactions. Patient counseled on being higher risk for anesthesia due to comorbidities: HFrEF, severe MR, recent NSTEMI and PCI, interrupted plavix dosing. Patient was told about increased risk of cardiac and respiratory events, including death. Discussed possibility of prolonged intubation.   Discussed the role of CRNA in patient's perioperative  care. Patient understands.)        Anesthesia Quick Evaluation

## 2021-10-01 NOTE — Anesthesia Procedure Notes (Signed)
Procedure Name: Intubation Date/Time: 10/01/2021 6:18 PM Performed by: Lily Peer, Joziah Dollins, CRNA Pre-anesthesia Checklist: Patient identified, Emergency Drugs available, Suction available and Patient being monitored Patient Re-evaluated:Patient Re-evaluated prior to induction Oxygen Delivery Method: Circle system utilized Preoxygenation: Pre-oxygenation with 100% oxygen Induction Type: IV induction Ventilation: Mask ventilation without difficulty Laryngoscope Size: McGraph and 4 Tube type: Oral Tube size: 7.0 mm Number of attempts: 1 Airway Equipment and Method: Stylet and Oral airway Placement Confirmation: ETT inserted through vocal cords under direct vision, positive ETCO2 and breath sounds checked- equal and bilateral Secured at: 21 cm Tube secured with: Tape Dental Injury: Teeth and Oropharynx as per pre-operative assessment

## 2021-10-01 NOTE — Progress Notes (Deleted)
Patient ID: EFREN KROSS MRN: [id number]; DOB: 1932/11/23   Henry Lindsey is a 87 y.o. male with a history of CAD, ICM, HFrEF, HTN, HL, DMII, GERD, CVA, PAD, and carotid arterial disease.  Echo 08/10/21: Left Ventricle: Left ventricular ejection fraction, by estimation, is 25 to 30%. The left ventricle has severely decreased function. The left ventricle demonstrates global hypokinesis. The average left ventricular global longitudinal strain is -6.3 %. The global longitudinal strain is abnormal. The left ventricular internal  cavity size was normal in size. There is no left ventricular hypertrophy. Left ventricular diastolic parameters are consistent with Grade II diastolic dysfunction (pseudonormalization).   Right Ventricle: The right ventricular size is moderately enlarged. No increase in right ventricular wall thickness. Right ventricular systolic function is moderately reduced. There is mildly elevated pulmonary artery systolic pressure. The tricuspid  regurgitant velocity is 2.58 m/s, and with an assumed right atrial  pressure of 15 mmHg, the estimated right ventricular systolic pressure is 98.3 mmHg.   LHC 08/15/21:   Mid LM to Dist LM lesion is 30% stenosed.   Ost LAD lesion is 85% stenosed.   Prox LAD lesion is 50% stenosed.   Dist LAD lesion is 35% stenosed.   Prox Cx lesion is 50% stenosed.   Prox RCA to Dist RCA lesion is 100% stenosed.   1st Diag lesion is 80% stenosed.   1st Mrg lesion is 70% stenosed.   A drug-eluting stent was successfully placed using a STENT ONYX FRONTIER [id number].   Post intervention, there is a 0% residual stenosis.   Post intervention, there is a 0% residual stenosis.   Post intervention, there is a 0% residual stenosis.   Successful Impella supported high risk PCI of the ostial LAD with atherectomy and drug-eluting stent placement.  The stent extended 1 to 2 mm into the left main coronary artery to ensure coverage of the ostium.  There was normal flow  into the  left circumflex.Moderately elevated left ventricular end-diastolic pressure at 24 mmHg at the beginning of the case.  Admitted 10/20-10/26/22 at [facility] due to progressive dyspnea, increasing abdominal girth, lower extremity edema and transferred to [facility] for high risk PCI. Successful Impella supported high risk PCI of the ostial LAD with atherectomy and drug-eluting stent placement.   He presents today for a follow-up visit with a chief complaint of   Past Medical History:  Diagnosis Date   Anginal pain ([place])    Aortic atherosclerosis ([place])    Arthritis    BPH (benign prostatic hyperplasia)    CAD (coronary artery disease)    a.) 2/22 Cath: LM 50d, LAD sev ost dz, mod-sev prox/mid dz. D1 sev dz. LCX mild prox dzs, OM2 mod-sev dz, RCA 100p. RPL and RPDA fill via L-R collats. EF 35%. Seen by CVTS->Med mgmt. b.) R/LHC 08/14/21: 30% m-d-LM, 85% oLAD, 50% pLAD, 35% dLAD, 80% D1, 50% pLCx, 70% OM1, 100% p-dRCA -> trans to [facility]. c.) 08/15/21 Impella supported HIGH RISK PCI with atherectomy. 2.75 x 73mm Onyx Frontier DES oLAD.   Carotid artery stenosis    a. 11/2010 U/S: <50% bilaterally; b. 03/2021 40-59% bilat ICA stenoses.   COPD (chronic obstructive pulmonary disease) ([place])    noted CT 10/05/16 former smoker quit age 34    CVA (cerebral infarction) 11/2010   a.) RIGHT thalamic lacunar   GERD (gastroesophageal reflux disease)    Heart murmur    HFrEF (heart failure with reduced ejection fraction) ([place])    a. 10/2020 Echo: EF  35%.   Hyperlipidemia    Hypertension    Ischemic cardiomyopathy    a. 10/2020 Echo: EF 35%. Nl RVSP. Mod MR. Mild TR; b. 11/2020 Cath:  CO/CI 5.05/2.49. LV gram: EF 35%.   Lower extremity cellulitis    NSTEMI (non-ST elevated myocardial infarction) ([place]) 08/09/2021   a.) R/LHC 08/14/2021: 30% m-d-LM, 85% oLAD, 50% pLAD, 35% dLAD, 80% D1, 50% pLCx, 70% OM1, 100% p-dRCA -> transfer to [facility]. b.) 08/15/2021 Impella supported HIGH RISK PCI with atherectomy. 2.75 x 32mm Onyx  Frontier DES to [employer].   Peripheral vascular disease in diabetes mellitus ([place])    a. 06/2012 95% occlusion s/p PTA R SFA (Dr. Lucky Cowboy); b. 03/2021 ABIs: R 1.02, L 0.96.   Pneumonia    Type 2 diabetes mellitus treated with insulin [facility])    Past Surgical History:  Procedure Laterality Date   ABDOMINAL AORTOGRAM N/A 08/14/2021   Procedure: ABDOMINAL AORTOGRAM;  Surgeon: Nelva Bush, MD;  Location: [place] [facility];  Service: Cardiovascular;  Laterality: N/A;   CHOLECYSTECTOMY N/A 11/13/2016   Procedure: LAPAROSCOPIC CHOLECYSTECTOMY WITH INTRAOPERATIVE CHOLANGIOGRAM;  Surgeon: Olean Ree, MD;  Location: [facility] ORS;  Service: General;  Laterality: N/A;   COLONOSCOPY WITH PROPOFOL N/A 02/10/2018   Procedure: COLONOSCOPY WITH PROPOFOL;  Surgeon: Lucilla Lame, MD;  Location: [facility] ENDOSCOPY;  Service: Endoscopy;  Laterality: N/A;   CORONARY ATHERECTOMY N/A 08/15/2021   Procedure: CORONARY ATHERECTOMY;  Surgeon: Wellington Hampshire, MD;  Location: [place] [facility];  Service: Cardiovascular;  Laterality: N/A;   CORONARY STENT INTERVENTION W/IMPELLA N/A 08/15/2021   Procedure: Coronary Stent Intervention w/Impella;  Surgeon: Wellington Hampshire, MD;  Location: [place] [facility];  Service: Cardiovascular;  Laterality: N/A;   ERCP N/A 11/17/2016   Procedure: ENDOSCOPIC RETROGRADE CHOLANGIOPANCREATOGRAPHY (ERCP);  Surgeon: Irene Shipper, MD;  Location: [facility] ENDOSCOPY;  Service: Endoscopy;  Laterality: N/A;   ERCP N/A 02/04/2017   Procedure: ENDOSCOPIC RETROGRADE CHOLANGIOPANCREATOGRAPHY (ERCP) Stent removal;  Surgeon: Lucilla Lame, MD;  Location: [facility] ENDOSCOPY;  Service: Endoscopy;  Laterality: N/A;   IR GENERIC HISTORICAL  10/06/2016   IR PERC CHOLECYSTOSTOMY 10/06/2016 Aletta Edouard, MD [facility]-INTERV RAD   JOINT REPLACEMENT Left 2014   left knee   RIGHT/LEFT HEART CATH AND CORONARY ANGIOGRAPHY N/A 11/24/2020   Procedure: RIGHT/LEFT HEART CATH AND CORONARY ANGIOGRAPHY;  Surgeon: Minna Merritts, MD;  Location:  [place] [facility];  Service: Cardiovascular;  Laterality: N/A;   RIGHT/LEFT HEART CATH AND CORONARY ANGIOGRAPHY N/A 08/14/2021   Procedure: RIGHT/LEFT HEART CATH AND CORONARY ANGIOGRAPHY;  Surgeon: Nelva Bush, MD;  Location: [place] [facility];  Service: Cardiovascular;  Laterality: N/A;   TOTAL KNEE ARTHROPLASTY Right 08/08/2020   Procedure: TOTAL KNEE ARTHROPLASTY;  Surgeon: Lovell Sheehan, MD;  Location: [facility] ORS;  Service: Orthopedics;  Laterality: Right;   UPPER GASTROINTESTINAL ENDOSCOPY     WRIST SURGERY     Family History  Problem Relation Age of Onset   Diabetes Mother    Social History   Tobacco Use   Smoking status: Former    Packs/day: 0.50    Years: 33.00    Pack years: 16.50    Types: Cigarettes    Quit date: 04/21/1968    Years since quitting: 53.4   Smokeless tobacco: Never  Substance Use Topics   Alcohol use: No   Allergies  Allergen Reactions   Jardiance [Empagliflozin]     Rash all over and lips turn purple.    Metformin And Related Diarrhea  Trazodone Other (See Comments)    Extreme hallucinations/ psychosis      Review of Systems  Constitutional:  Positive for malaise/fatigue.  HENT: Negative.    Eyes: Negative.   Respiratory:  Positive for shortness of breath (with exertion). Negative for cough and wheezing.   Cardiovascular:  Positive for leg swelling. Negative for chest pain, palpitations and orthopnea.  Gastrointestinal: Negative.   Genitourinary: Negative.   Musculoskeletal: Negative.  Negative for falls.  Skin: Negative.   Neurological:  Negative for dizziness, weakness and headaches.  Endo/Heme/Allergies: Negative.   Psychiatric/Behavioral:  The patient is not nervous/anxious and does not have insomnia.        Physical Exam Vitals and nursing note reviewed. Exam conducted with a chaperone present (friend).  Constitutional:      General: He is not in acute distress.    Appearance: He is not ill-appearing.  Neck:      Vascular: No carotid bruit.  Cardiovascular:     Rate and Rhythm: Normal rate and regular rhythm.     Heart sounds: Normal heart sounds.  Pulmonary:     Effort: No respiratory distress.     Breath sounds: No wheezing or rales.  Abdominal:     Palpations: Abdomen is soft.  Musculoskeletal:     Right lower leg: Edema present.     Left lower leg: Edema present.  Skin:    General: Skin is warm and dry.  Neurological:     Mental Status: He is alert and oriented to person, place, and time.  Psychiatric:        Mood and Affect: Mood normal.        Thought Content: Thought content normal.     Comments: Poor historian, unclear if related to cognitive decline or recent hospitalization      Assessment & Plan: Chronic Heart failure with reduced ejection fraction- - NYHA III - euvolemic - weighing daily; instructed to call for an overnight weight gain of > 2 pounds or a weekly weight gain of > 5 pounds - weight 178.6 from last visit here 6 weeks ago - on GDMT of metoprolol succinate and spironolactone  - allergic to Jardiance - BP historically soft - O2 HS and at rest since dc - advised to stop taking cilostazol as it's bad for your heart; cardiology had previously advised to stop it and patient thinks he's not taking it anymore but is unclear - BNP 08/09/21 4500  2.  CAD - DES to ostial LAD - ASA, plavix - saw cardiology Kathlen Mody) 09/25/21  3.  DM - A1c 09/26/21 was 7.0% - BMP 09/26/21 reviewed and showed sodium 137, potassium 4.3 ,creatinine 0.8 and GFR 92  4. Lymphedema - stage 2 - limited in his ability to exercise due to his shortness of breath - woody appearance but no open wounds - unclear how or if he wears compression socks, however he was not wearing at visit - consider compression boots if edema persists   Medication bottles brought in and reviewed. Attempted to clarify medication list. Pts dtr fills his medication pill box every three weeks.

## 2021-10-01 NOTE — Anesthesia Postprocedure Evaluation (Signed)
Anesthesia Post Note  Patient: Henry Lindsey  Procedure(s) Performed: IRRIGATION AND DEBRIDMENT RIGHT KNEE WITH POLY EXCHANGE (Right: Knee)  Patient location during evaluation: PACU Anesthesia Type: General Level of consciousness: awake and alert Pain management: pain level controlled Vital Signs Assessment: post-procedure vital signs reviewed and stable Respiratory status: spontaneous breathing, nonlabored ventilation, respiratory function stable and patient connected to nasal cannula oxygen Cardiovascular status: blood pressure returned to baseline and stable Postop Assessment: no apparent nausea or vomiting Anesthetic complications: no   No notable events documented.   Last Vitals:  Vitals:   10/01/21 2122 10/01/21 2303  BP:  119/70  Pulse: 98 94  Resp:  (!) 24  Temp:  36.6 C  SpO2: 100% 100%    Last Pain:  Vitals:   10/01/21 2142  TempSrc:   PainSc: 6                  Arita Miss

## 2021-10-01 NOTE — Anesthesia Procedure Notes (Addendum)
Arterial Line Insertion Start/End12/09/2021 6:10 PM, 10/01/2021 6:13 PM Performed by: Arita Miss, MD, attending  Patient location: OR. Preanesthetic checklist: patient identified, IV checked, site marked, risks and benefits discussed, surgical consent, monitors and equipment checked, pre-op evaluation, timeout performed and anesthesia consent Lidocaine 1% used for infiltration Right, radial was placed Catheter size: 20 G Hand hygiene performed  and maximum sterile barriers used   Attempts: 2 Procedure performed using ultrasound guided technique. Ultrasound Notes:anatomy identified, needle tip was noted to be adjacent to the nerve/plexus identified and no ultrasound evidence of intravascular and/or intraneural injection Following insertion, dressing applied and Biopatch. Post procedure assessment: normal and unchanged  Patient tolerated the procedure well with no immediate complications. Additional procedure comments: Tortuous enlarged right radial artery at level of wrist. Ultrasound used to find a linear, non-tortuous segment of radial artery more proximal..

## 2021-10-01 NOTE — Op Note (Signed)
DATE OF SURGERY:  10/01/2021 TIME: 7:52 PM  PATIENT NAME:  Henry Lindsey   AGE: 87 y.o.    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   PREOPERATIVE INDICATIONS:  Henry Lindsey is an 87 y.o. 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 He is unable to discontinue his dual platelet therapy per cardiology and given his high risk status, the DAIR procedure was preferred over two stage revision.   The risks, benefits, and alternatives were discussed at length including but not limited to the risks of infection, bleeding, nerve or blood vessel injury, knee stiffness, fracture, dislocation, loosening or failure of the hardware and the need for further surgery. Medical risks include but not limited to DVT and pulmonary embolism, myocardial infarction, stroke, pneumonia, respiratory failure and death. I discussed these risks with the patient in my office prior to the date of surgery. They understood these risks and were willing to proceed.  OPERATIVE FINDINGS AND UNIQUE ASPECTS OF THE CASE:  Extensive synovial thickening, yellow cloudy synovial fluid was observed.   OPERATIVE DESCRIPTION:  The patient was brought to the operative room and placed in a supine position after undergoing placement of a general anesthetic. IV antibiotics were given. The lower extremity was prepped and draped in the usual sterile fashion.  A time out was performed to verify the  patient's name, date of birth, medical record number, correct site of surgery and correct procedure to be performed. The timeout was also used to confirm the patient received antibiotics and that appropriate instruments, implants and radiographs studies were available in the room.  The leg was elevated and the tourniquet was inflated to 250 mmHg.  The prior incision was utilized, followed by a medial parapatellar approach. The medial and lateral gutters were debrided of extensive synovial thickened tissue. More than 4 liters was irrigated through the knee. A brush was used to mechanically debride all surfaces. The prior insert had been removed with a Tonsil and Kocher. After appropriate debridement the final 11 mm DISH insert was placed.  The knee was taken through a range of motion and the patella tracked well and the knee was again irrigated copiously.  The knee capsule was then injected with Exparel.  The medial arthrotomy was closed with #1 Vicryl and #2 Quill. The subcutaneous tissue closed with  2-0 vicryl, and skin approximated with staples.  A dry sterile and compressive dressing was applied.    The patient was awakened and brought to the PACU in stable and satisfactory condition.  All sharp, lap and instrument counts were correct at the conclusion the case. I spoke with the patient's family in the postop consultation room to let them know the case had been performed without complication and the patient was stable in recovery room.   Total tourniquet time was 39 minutes.

## 2021-10-01 NOTE — H&P (Signed)
The patient has been re-examined, and the chart reviewed, and there have been no interval changes to the documented history and physical.  Plan a right knee irrigation, debridement and polyethylene exchange today.  Anesthesia is not consulted regarding a peripheral nerve block for post-operative pain.  The risks, benefits, and alternatives have been discussed at length, and the patient is willing to proceed.

## 2021-10-02 ENCOUNTER — Encounter: Payer: Self-pay | Admitting: Orthopedic Surgery

## 2021-10-02 ENCOUNTER — Other Ambulatory Visit: Payer: Medicare Other

## 2021-10-02 ENCOUNTER — Ambulatory Visit: Payer: Medicare Other | Admitting: Family

## 2021-10-02 DIAGNOSIS — T8459XA Infection and inflammatory reaction due to other internal joint prosthesis, initial encounter: Secondary | ICD-10-CM

## 2021-10-02 DIAGNOSIS — A4901 Methicillin susceptible Staphylococcus aureus infection, unspecified site: Secondary | ICD-10-CM

## 2021-10-02 LAB — BASIC METABOLIC PANEL
Anion gap: 7 (ref 5–15)
BUN: 28 mg/dL — ABNORMAL HIGH (ref 8–23)
CO2: 25 mmol/L (ref 22–32)
Calcium: 8.5 mg/dL — ABNORMAL LOW (ref 8.9–10.3)
Chloride: 101 mmol/L (ref 98–111)
Creatinine, Ser: 1.15 mg/dL (ref 0.61–1.24)
GFR, Estimated: >60 mL/min (ref >60)
Glucose, Bld: 145 mg/dL — ABNORMAL HIGH (ref 70–99)
Potassium: 4.3 mmol/L (ref 3.5–5.1)
Sodium: 133 mmol/L — ABNORMAL LOW (ref 135–145)

## 2021-10-02 LAB — GLUCOSE, CAPILLARY
Glucose-Capillary: 135 mg/dL — ABNORMAL HIGH (ref 70–99)
Glucose-Capillary: 145 mg/dL — ABNORMAL HIGH (ref 70–99)
Glucose-Capillary: 137 mg/dL — ABNORMAL HIGH (ref 70–99)
Glucose-Capillary: 151 mg/dL — ABNORMAL HIGH (ref 70–99)
Glucose-Capillary: 123 mg/dL — ABNORMAL HIGH (ref 70–99)

## 2021-10-02 LAB — CBC
HCT: 32.6 % — ABNORMAL LOW (ref 39.0–52.0)
Hemoglobin: 9.8 g/dL — ABNORMAL LOW (ref 13.0–17.0)
MCH: 24 pg — ABNORMAL LOW (ref 26.0–34.0)
MCHC: 30.1 g/dL (ref 30.0–36.0)
MCV: 79.7 fL — ABNORMAL LOW (ref 80.0–100.0)
Platelets: 261 10*3/uL (ref 150–400)
RBC: 4.09 MIL/uL — ABNORMAL LOW (ref 4.22–5.81)
RDW: 19.4 % — ABNORMAL HIGH (ref 11.5–15.5)
WBC: 5.7 10*3/uL (ref 4.0–10.5)
nRBC: 0 % (ref 0.0–0.2)

## 2021-10-02 MED ORDER — RIFAMPIN 300 MG PO CAPS
300.0000 mg | ORAL_CAPSULE | Freq: Two times a day (BID) | ORAL | Status: DC
  Administered 2021-10-02 – 2021-10-04 (×4): 300 mg via ORAL
  Filled 2021-10-02 (×5): qty 1

## 2021-10-02 NOTE — Progress Notes (Signed)
Physical Therapy Treatment Patient Details Name: Henry Lindsey MRN: [id number] DOB: 05-28-1933 Today's Date: 10/02/2021   History of Present Illness Pt admitted for R TKR revision secondary to infection with I&D. 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 PMHx: CHF, EF 35%, HTN, HLD, DM, COPD, stroke, GERD, PVD, R PTA SFA, CAD, carotid artery stenosis, CKD 3, diverticulitis.    PT Comments    Pt is making great progress towards goals with improved ambulation this date. Agreeable to hallway ambulation, however does self limit distance. Written HEP given and reviewed and performs well. Will continue to progress as able. Still limited by pain.   Recommendations for follow up therapy are one component of a multi-disciplinary discharge planning process, led by the attending physician.  Recommendations may be updated based on patient status, additional functional criteria and insurance authorization.  Follow Up Recommendations  Home health PT     Assistance Recommended at Discharge Intermittent Supervision/Assistance  Equipment Recommendations  None recommended by PT    Recommendations for Other Services       Precautions / Restrictions Precautions Precautions: Fall;Knee Precaution Booklet Issued: Yes (comment) Restrictions Weight Bearing Restrictions: Yes RLE Weight Bearing: Weight bearing as tolerated     Mobility  Bed Mobility Overal bed mobility: Needs Assistance Bed Mobility: Supine to Sit;Sit to Supine     Supine to sit: Min guard Sit to supine: Supervision   General bed mobility comments: ipmroved technique, although does take increased time/effort. Once seated at EOB, able to demonstrate upright posture    Transfers Overall transfer level: Needs assistance Equipment used: Rolling walker (2 wheels) Transfers: Sit to/from Stand Sit to Stand: Min guard           General transfer comment: cues for hand placement. Once standing, upright posture noted. RW used     Ambulation/Gait Ambulation/Gait assistance: Counsellor (Feet): 140 Feet Assistive device: Rolling walker (2 wheels) Gait Pattern/deviations: Step-through pattern       General Gait Details: improved gait pattern with ability to demonstrate periods of reciprocal gait. Still concerned of "over doing it" therefore was slightly self limiting. No increased pain with exertion   Stairs             Wheelchair Mobility    Modified Rankin (Stroke Patients Only)       Balance Overall balance assessment: Needs assistance Sitting-balance support: Feet supported;Single extremity supported Sitting balance-Leahy Scale: Good     Standing balance support: Bilateral upper extremity supported;Single extremity supported;Reliant on assistive device for balance Standing balance-Leahy Scale: Fair Standing balance comment: able to stand with LUE support on RW and use RUE to hold cup to drink, reliant on RW with BUE support with dynamic balance                            Cognition Arousal/Alertness: Awake/alert Behavior During Therapy: WFL for tasks assessed/performed Overall Cognitive Status: Within Functional Limits for tasks assessed                                 General Comments: ipmroved safety awareness this session and eager to participate        Exercises Other Exercises Other Exercises: supine ther-ex performed and written HEP given. AP, quad sets, SLRs, hip abd/add, and SAQ. 12 reps with cga. Other Exercises: Pt instructed in bed mobility, ADL transfers, AE/DME for ADL, home/routines modifications, polar care mgt,  and falls prevention strategies. Handout provided.    General Comments        Pertinent Vitals/Pain Pain Assessment: 0-10 Pain Score: 5  Pain Location: R knee Pain Descriptors / Indicators: Operative site guarding;Aching Pain Intervention(s): Limited activity within patient's tolerance;Ice applied;Repositioned     Home Living Family/patient expects to be discharged to:: Private residence Living Arrangements: Spouse/significant other Available Help at Discharge: Family;Friend(s);Available 24 hours/day Type of Home: House Home Access: Level entry       Home Layout: One level Home Equipment: Conservation officer, nature (2 wheels);Cane - single point;BSC/3in1 Additional Comments: has his own home, however is planning to return to living with his girlfriend    Prior Function            PT Goals (current goals can now be found in the care plan section) Acute Rehab PT Goals Patient Stated Goal: to go home PT Goal Formulation: With patient Time For Goal Achievement: 10/16/21 Potential to Achieve Goals: Good Additional Goals Additional Goal #1: Pt will be able to perform bed mobility/transfers with supervision and safe technique in order to improve functional independence Progress towards PT goals: Progressing toward goals    Frequency    BID      PT Plan Current plan remains appropriate    Co-evaluation              AM-PAC PT "6 Clicks" Mobility   Outcome Measure  Help needed turning from your back to your side while in a flat bed without using bedrails?: A Little Help needed moving from lying on your back to sitting on the side of a flat bed without using bedrails?: A Little Help needed moving to and from a bed to a chair (including a wheelchair)?: A Little Help needed standing up from a chair using your arms (e.g., wheelchair or bedside chair)?: A Little Help needed to walk in hospital room?: A Little Help needed climbing 3-5 steps with a railing? : A Little 6 Click Score: 18    End of Session Equipment Utilized During Treatment: Gait belt Activity Tolerance: Patient limited by pain Patient left: in chair;with chair alarm set;with family/visitor present;with SCD's reapplied Nurse Communication: Mobility status PT Visit Diagnosis: Muscle weakness (generalized) (M62.81);Difficulty  in walking, not elsewhere classified (R26.2);Pain Pain - Right/Left: Right Pain - part of body: Knee     Time: 6063-0160 PT Time Calculation (min) (ACUTE ONLY): 23 min  Charges:  $Gait Training: 8-22 mins $Therapeutic Exercise: 8-22 mins                     Henry Lindsey, PT, DPT [phone]    Henry Lindsey 10/02/2021, 3:20 PM

## 2021-10-02 NOTE — TOC Initial Note (Addendum)
Transition of Care [facility]) - Initial/Assessment Note    Patient Details  Name: Henry Lindsey MRN: [id number] Date of Birth: 1933-07-29  Transition [facility]) CM/SW Contact:    Pete Pelt, RN Phone Number: 10/02/2021, 9:38 AM  Clinical Narrative:   Patient lives at home with girlfriend, who "takes care of him".  He states girlfriend transports to appointments and pharmacy.  He voices no concerns about taking medications as directed.  Patient was in with physical therapy, TOC contact information provided, will await PT recommendations.            Addendum:  [id number]:  Home Health PT recommended for patient.  [facility] to accept to their services as per Corene Cornea at agency Expected Discharge Plan:  (TBD)     Patient Goals and CMS Choice        Expected Discharge Plan and Services Expected Discharge Plan:  (TBD)   Discharge Planning Services: CM Consult   Living arrangements for the past 2 months: Single Family Home                                      Prior Living Arrangements/Services Living arrangements for the past 2 months: Single Family Home Lives with:: Self, Domestic Partner Patient language and need for interpreter reviewed:: Yes (No interpreter required) Do you feel safe going back to the place where you live?: Yes      Need for Family Participation in Patient Care: Yes (Comment) Care giver support system in place?: Yes (comment)   Criminal Activity/Legal Involvement Pertinent to Current Situation/Hospitalization: No - Comment as needed  Activities of Daily Living Home Assistive Devices/Equipment: Walker (specify type), Cane (specify quad or straight) ADL Screening (condition at time of admission) Patient's cognitive ability adequate to safely complete daily activities?: Yes Is the patient deaf or have difficulty hearing?: No Does the patient have difficulty seeing, even when wearing glasses/contacts?: No Does the patient have difficulty  concentrating, remembering, or making decisions?: No Patient able to express need for assistance with ADLs?: Yes Does the patient have difficulty dressing or bathing?: No Independently performs ADLs?: Yes (appropriate for developmental age) Does the patient have difficulty walking or climbing stairs?: Yes Weakness of Legs: Right Weakness of Arms/Hands: None  Permission Sought/Granted Permission sought to share information with : Case Manager Permission granted to share information with : Yes, Verbal Permission Granted              Emotional Assessment Appearance:: Appears stated age Attitude/Demeanor/Rapport: Gracious, Engaged Affect (typically observed): Calm, Appropriate Orientation: : Oriented to Self, Oriented to Place, Oriented to  Time, Oriented to Situation Alcohol / Substance Use: Not Applicable Psych Involvement: No (comment)  Admission diagnosis:  S/P revision of total knee, right [Z96.651] Patient Active Problem List   Diagnosis Date Noted   S/P revision of total knee, right 10/01/2021   Non-ST elevation (NSTEMI) myocardial infarction ([place]) 08/20/2021   Acute on chronic systolic (congestive) heart failure ([place]) 08/15/2021   Cardiomyopathy ([place]) 08/14/2021   NSTEMI (non-ST elevated myocardial infarction) ([place])    Demand ischemia ([place])    Acute on chronic HFrEF (heart failure with reduced ejection fraction) ([place]) 08/09/2021   Type II diabetes mellitus with renal manifestations ([place]) 08/09/2021   COPD (chronic obstructive pulmonary disease) ([place])    CKD (chronic kidney disease), stage IIIa    Acute respiratory failure with hypoxia ([place])  Elevated troponin    Abnormal LFTs    Swelling of limb 03/27/2021   Ischemic cardiomyopathy 11/24/2020   Unstable angina (HCC) 11/24/2020   Acute on chronic combined systolic and diastolic CHF (congestive heart failure) ([place]) 11/24/2020   Postoperative visit 08/22/2020   S/P TKR (total knee replacement) using cement, right  08/08/2020   Open wound of lower leg 05/29/2020   Plantar fasciitis 03/22/2020   Lumbar radiculopathy 07/05/2019   Chronic obstructive pulmonary disease ([place]) 06/11/2019   Gastroesophageal reflux disease without esophagitis 06/11/2019   Neuropathy 06/11/2019   Recurrent productive cough 03/27/2019   Chronic diarrhea    Benign neoplasm of transverse colon    Chronic iron deficiency anemia 10/09/2017   History of diverticulitis 10/05/2017   S/P laparoscopic cholecystectomy 12/05/2016   Bile duct leak    Chest pain 10/05/2016   Pain in joint, ankle and foot 02/25/2015   Diabetic neuropathy ([place]) 10/09/2014   BPH with obstruction/lower urinary tract symptoms 10/09/2014   Bursitis of hip, right 04/05/2014   Glaucoma 04/05/2014   BPH (benign prostatic hyperplasia) 04/05/2014   Peripheral autonomic neuropathy due to DM ([place]) 12/28/2013   Vitamin D deficiency 11/03/2013   Incomplete emptying of bladder 08/04/2013   Benign localized hyperplasia of prostate with urinary obstruction and other lower urinary tract symptoms (LUTS)(600.21) 06/24/2013   Nocturia 06/24/2013   Orchalgia 06/24/2013   Impotence due to erectile dysfunction 06/01/2013   Routine general medical examination at a health care facility 04/04/2013   CAD (coronary artery disease) 07/21/2012   Peripheral vascular disease in diabetes mellitus ([place])    Carotid artery stenosis with cerebral infarction ([place])    History of basal cell carcinoma excision 08/05/2011   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/05/2011   Uncontrolled type 2 diabetes mellitus with diabetic polyneuropathy, with long-term current use of insulin 08/05/2011   Stroke ([place]) 08/05/2011   Hyperlipidemia    Hypertension    History of CVA (cerebrovascular accident)    PCP:  Baxter Hire, MD Pharmacy:   [id number] [address] [place] [address] Phone: [phone] Fax:  [place], [place] [address] Phone: [phone] Fax: [phone]     Social Determinants of Health (SDOH) Interventions    Readmission Risk Interventions Readmission Risk Prevention Plan 08/17/2021  Transportation Screening Complete  PCP or Specialist Appt within 3-5 Days Complete  HRI or Home Care Consult Complete  Social Work Consult for [place] Planning/Counseling Complete  Palliative Care Screening Not Applicable  Medication Review Press photographer) Complete  Some recent data might be hidden

## 2021-10-02 NOTE — Consult Note (Signed)
NAME: Henry Lindsey  DOB: Nov 28, 1932  MRN: [id number]  Date/Time: 10/02/2021 6:24 PM  REQUESTING PROVIDER: Dr. 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 ? YSABEL Lindsey is a 87 y.o. male with a history of CAD status post stent placement, bilateral leg swelling, right prosthetic joint infection, peripheral arterial disease, is admitted for I&D and knee washout after being diagnosed with MSSA septic arthritis by orthopedics.  Patient underwent knee replacement April 2021.  Then in December 2021 patient started having shortness of breath and bronchitis-like feature and x-ray had shown bilateral pleural effusion which was getting worse with lower extremity edema.  Lindsey was given Lasix by his PCP and recommended echocardiogram.  Echo done on 11/14/2020 showed ejection fraction of 35% with moderate MR, mild TR and mild pericardial effusion. Then in February 2022 Lindsey underwent a cardiac cath and that showed 70% stenosis at the LAD and proximal RCA 100% stenosis and first diagonal 80% stenosis.  Lindsey was seen by cardiothoracic surgeon and [facility] and was recommended medical management. Lindsey was also followed at the wound clinic because of bilateral venous edema and venous stasis ulcers and weeping and scabbing wounds on the right leg.  During October 2022 Lindsey was hospitalized with worsening shortness of breath.  Lindsey was diagnosed with NSTEMI and acute on chronic systolic congestive heart failure and acute on chronic.  Lindsey underwent another cardiac cath which showed severe triple-vessel disease with LAD severe ostial disease.  Dr. Fletcher Anon took him to [facility] and did a successful Impella supported high risk PCI of the ostial LAD with atherectomy and drug-eluting stent placement on 08/15/21.  Lindsey was placed on dual antiplatelet therapy.  Patient says since that procedure his leg swelling was getting much better except for the right knee which remained swollen and painful.Henry Lindsey  saw Dr. Harlow Mares on 09/21/2021 and the right knee was aspirated and a cortisone injection was given.  As the culture grew staph aureus patient was asked to get hospitalized and underwent irrigation and debridement right knee with polyexchange on 10/01/2021. I am asked to see the patient for the same. Patient is a daughters at bedside.  Patient is feeling better. No fever   Past Medical History:  Diagnosis Date   Anginal pain ([place])    Aortic atherosclerosis (HCC)    Arthritis    BPH (benign prostatic hyperplasia)    CAD (coronary artery disease)    a.) 2/22 Cath: LM 50d, LAD sev ost dz, mod-sev prox/mid dz. D1 sev dz. LCX mild prox dzs, OM2 mod-sev dz, RCA 100p. RPL and RPDA fill via L-R collats. EF 35%. Seen by CVTS->Med mgmt. b.) R/LHC 08/14/21: 30% m-d-LM, 85% oLAD, 50% pLAD, 35% dLAD, 80% D1, 50% pLCx, 70% OM1, 100% p-dRCA -> trans to [facility]. c.) 08/15/21 Impella supported HIGH RISK PCI with atherectomy. 2.75 x 44mm Onyx Frontier DES oLAD.   Carotid artery stenosis    a. 11/2010 U/S: <50% bilaterally; b. 03/2021 40-59% bilat ICA stenoses.   COPD (chronic obstructive pulmonary disease) ([place])    noted CT 10/05/16 former smoker quit age 3    CVA (cerebral infarction) 11/2010   a.) RIGHT thalamic lacunar   GERD (gastroesophageal reflux disease)    Heart murmur    HFrEF (heart failure with reduced ejection fraction) ([place])    a. 10/2020 Echo: EF 35%.   Hyperlipidemia    Hypertension    Ischemic cardiomyopathy    a. 10/2020 Echo: EF 35%. Nl RVSP.  Mod MR. Mild TR; b. 11/2020 Cath:  CO/CI 5.05/2.49. LV gram: EF 35%.   Lower extremity cellulitis    NSTEMI (non-ST elevated myocardial infarction) ([place]) 08/09/2021   a.) R/LHC 08/14/2021: 30% m-d-LM, 85% oLAD, 50% pLAD, 35% dLAD, 80% D1, 50% pLCx, 70% OM1, 100% p-dRCA -> transfer to [facility]. b.) 08/15/2021 Impella supported HIGH RISK PCI with atherectomy. 2.75 x 18mm Onyx Frontier DES to [employer].   Peripheral vascular disease in diabetes mellitus ([place])    a.  06/2012 95% occlusion s/p PTA R SFA (Dr. Lucky Cowboy); b. 03/2021 ABIs: R 1.02, L 0.96.   Pneumonia    Type 2 diabetes mellitus treated with insulin [facility])     Past Surgical History:  Procedure Laterality Date   ABDOMINAL AORTOGRAM N/A 08/14/2021   Procedure: ABDOMINAL AORTOGRAM;  Surgeon: Nelva Bush, MD;  Location: [facility];  Service: Cardiovascular;  Laterality: N/A;   CHOLECYSTECTOMY N/A 11/13/2016   Procedure: LAPAROSCOPIC CHOLECYSTECTOMY WITH INTRAOPERATIVE CHOLANGIOGRAM;  Surgeon: Olean Ree, MD;  Location: [facility];  Service: General;  Laterality: N/A;   COLONOSCOPY WITH PROPOFOL N/A 02/10/2018   Procedure: COLONOSCOPY WITH PROPOFOL;  Surgeon: Lucilla Lame, MD;  Location: [facility] ENDOSCOPY;  Service: Endoscopy;  Laterality: N/A;   CORONARY ATHERECTOMY N/A 08/15/2021   Procedure: CORONARY ATHERECTOMY;  Surgeon: Wellington Hampshire, MD;  Location: [place] [facility];  Service: Cardiovascular;  Laterality: N/A;   CORONARY STENT INTERVENTION W/IMPELLA N/A 08/15/2021   Procedure: Coronary Stent Intervention w/Impella;  Surgeon: Wellington Hampshire, MD;  Location: [place] [facility];  Service: Cardiovascular;  Laterality: N/A;   ERCP N/A 11/17/2016   Procedure: ENDOSCOPIC RETROGRADE CHOLANGIOPANCREATOGRAPHY (ERCP);  Surgeon: Irene Shipper, MD;  Location: [facility] ENDOSCOPY;  Service: Endoscopy;  Laterality: N/A;   ERCP N/A 02/04/2017   Procedure: ENDOSCOPIC RETROGRADE CHOLANGIOPANCREATOGRAPHY (ERCP) Stent removal;  Surgeon: Lucilla Lame, MD;  Location: [facility] ENDOSCOPY;  Service: Endoscopy;  Laterality: N/A;   IR GENERIC HISTORICAL  10/06/2016   IR PERC CHOLECYSTOSTOMY 10/06/2016 Aletta Edouard, MD [facility]   JOINT REPLACEMENT Left 2014   left knee   RIGHT/LEFT HEART CATH AND CORONARY ANGIOGRAPHY N/A 11/24/2020   Procedure: RIGHT/LEFT HEART CATH AND CORONARY ANGIOGRAPHY;  Surgeon: Minna Merritts, MD;  Location: [facility];  Service: Cardiovascular;  Laterality: N/A;   RIGHT/LEFT HEART  CATH AND CORONARY ANGIOGRAPHY N/A 08/14/2021   Procedure: RIGHT/LEFT HEART CATH AND CORONARY ANGIOGRAPHY;  Surgeon: Nelva Bush, MD;  Location: [place] [facility];  Service: Cardiovascular;  Laterality: N/A;   TOTAL KNEE ARTHROPLASTY Right 08/08/2020   Procedure: TOTAL KNEE ARTHROPLASTY;  Surgeon: Lovell Sheehan, MD;  Location: [facility];  Service: Orthopedics;  Laterality: Right;   TOTAL KNEE REVISION Right 10/01/2021   Procedure: IRRIGATION AND DEBRIDMENT RIGHT KNEE WITH POLY EXCHANGE;  Surgeon: Lovell Sheehan, MD;  Location: [facility];  Service: Orthopedics;  Laterality: Right;   UPPER GASTROINTESTINAL ENDOSCOPY     WRIST SURGERY      Social History   Socioeconomic History   Marital status: Widowed    Spouse name: Enid Derry, deceased   Number of children: 2   Years of education: 69   Highest education level: Not on file  Occupational History    Employer: retired  Tobacco Use   Smoking status: Former    Packs/day: 0.50    Years: 33.00    Pack years: 16.50    Types: Cigarettes    Quit date: 04/21/1968    Years since quitting: 60.4  Smokeless tobacco: Never  Vaping Use   Vaping Use: Never used  Substance and Sexual Activity   Alcohol use: No   Drug use: No   Sexual activity: Not Currently    Partners: Female    Birth control/protection: None  Other Topics Concern   Not on file  Social History Narrative   Widowed in 2014; dating again , stays with girlfriend house   Social Determinants of Health   Financial Resource Strain: Not on file  Food Insecurity: Not on file  Transportation Needs: Not on file  Physical Activity: Not on file  Stress: Not on file  Social Connections: Not on file  Intimate Partner Violence: Not on file    Family History  Problem Relation Age of Onset   Diabetes Mother    Allergies  Allergen Reactions   Jardiance [Empagliflozin]     Rash all over and lips turn purple.    Metformin And Related Diarrhea   Trazodone Other (See Comments)     Extreme hallucinations/ psychosis    I? Current Facility-Administered Medications  Medication Dose Route Frequency Provider Last Rate Last Admin   acetaminophen (TYLENOL) tablet 325-650 mg  325-650 mg Oral Q6H PRN Lovell Sheehan, MD       albuterol (PROVENTIL) (2.5 MG/3ML) 0.083% nebulizer solution 3 mL  3 mL Nebulization Q6H PRN Lovell Sheehan, MD       alum & mag hydroxide-simeth (MAALOX/MYLANTA) 200-200-20 MG/5ML suspension 30 mL  30 mL Oral Q4H PRN Lovell Sheehan, MD       aspirin EC tablet 81 mg  81 mg Oral Daily Lovell Sheehan, MD   81 mg at 10/02/21 0914   atorvastatin (LIPITOR) tablet 40 mg  40 mg Oral q AM Lovell Sheehan, MD   40 mg at 10/02/21 1246   bisacodyl (DULCOLAX) suppository 10 mg  10 mg Rectal Daily PRN Lovell Sheehan, MD       ceFAZolin (ANCEF) IVPB 2g/100 mL premix  2 g Intravenous Q8H Lovell Sheehan, MD 200 mL/hr at 10/02/21 1457 2 g at 10/02/21 1457   clopidogrel (PLAVIX) tablet 75 mg  75 mg Oral Daily Lovell Sheehan, MD   75 mg at 10/02/21 0914   cyclobenzaprine (FLEXERIL) tablet 5-10 mg  5-10 mg Oral Daily PRN Lovell Sheehan, MD       diphenhydrAMINE (BENADRYL) 12.5 MG/5ML elixir 12.5-25 mg  12.5-25 mg Oral Q4H PRN Lovell Sheehan, MD       docusate sodium (COLACE) capsule 100 mg  100 mg Oral BID Lovell Sheehan, MD   100 mg at 10/02/21 4196   furosemide (LASIX) tablet 20 mg  20 mg Oral Daily Lovell Sheehan, MD   20 mg at 10/02/21 0914   gabapentin (NEURONTIN) capsule 400 mg  400 mg Oral BID Lovell Sheehan, MD   400 mg at 10/02/21 0914   HYDROcodone-acetaminophen (NORCO) 7.5-325 MG per tablet 1-2 tablet  1-2 tablet Oral Q4H PRN Lovell Sheehan, MD   1 tablet at 10/02/21 1745   HYDROcodone-acetaminophen (NORCO/VICODIN) 5-325 MG per tablet 1-2 tablet  1-2 tablet Oral Q4H PRN Lovell Sheehan, MD   1 tablet at 10/02/21 1251   insulin aspart (novoLOG) injection 0-15 Units  0-15 Units Subcutaneous TID WC Lovell Sheehan, MD   3 Units at 10/02/21 1703   insulin  aspart protamine- aspart (NOVOLOG MIX 70/30) injection 15 Units  15 Units Subcutaneous BID WC Lovell Sheehan, MD  15 Units at 10/02/21 1703   lactated ringers infusion   Intravenous Continuous Lovell Sheehan, MD       latanoprost (XALATAN) 0.005 % ophthalmic solution 1 drop  1 drop Both Eyes QHS Lovell Sheehan, MD   1 drop at 10/01/21 2310   menthol-cetylpyridinium (CEPACOL) lozenge 3 mg  1 lozenge Oral PRN Lovell Sheehan, MD       Or   phenol (CHLORASEPTIC) mouth spray 1 spray  1 spray Mouth/Throat PRN Lovell Sheehan, MD       metoCLOPramide (REGLAN) tablet 5-10 mg  5-10 mg Oral Q8H PRN Lovell Sheehan, MD       Or   metoCLOPramide (REGLAN) injection 5-10 mg  5-10 mg Intravenous Q8H PRN Lovell Sheehan, MD       metoprolol succinate (TOPROL-XL) 24 hr tablet 50 mg  50 mg Oral Daily Lovell Sheehan, MD   50 mg at 10/02/21 7096   morphine 2 MG/ML injection 0.5-1 mg  0.5-1 mg Intravenous Q2H PRN Lovell Sheehan, MD   1 mg at 10/02/21 2836   nitroGLYCERIN (NITROSTAT) SL tablet 0.4 mg  0.4 mg Sublingual Q5 min PRN Lovell Sheehan, MD       ondansetron [facility]) tablet 4 mg  4 mg Oral Q6H PRN Lovell Sheehan, MD       Or   ondansetron [facility]) injection 4 mg  4 mg Intravenous Q6H PRN Lovell Sheehan, MD       pantoprazole (PROTONIX) EC tablet 40 mg  40 mg Oral Daily Lovell Sheehan, MD   40 mg at 10/02/21 6294   rifampin (RIFADIN) capsule 300 mg  300 mg Oral Q12H Tsosie Billing, MD       sacubitril-valsartan (ENTRESTO) 24-26 mg per tablet  1 tablet Oral BID Lovell Sheehan, MD   1 tablet at 10/02/21 7654   spironolactone (ALDACTONE) tablet 12.5 mg  12.5 mg Oral Daily Lovell Sheehan, MD   12.5 mg at 10/02/21 0911   tamsulosin (FLOMAX) capsule 0.4 mg  0.4 mg Oral QPC breakfast Lovell Sheehan, MD   0.4 mg at 10/02/21 0914   timolol (TIMOPTIC) 0.5 % ophthalmic solution 1 drop  1 drop Both Eyes q AM Lovell Sheehan, MD   1 drop at 10/02/21 6503   vitamin B-12 (CYANOCOBALAMIN) tablet 500 mcg   500 mcg Oral Daily Lovell Sheehan, MD   500 mcg at 10/02/21 5465     Abtx:  Anti-infectives (From admission, onward)    Start     Dose/Rate Route Frequency Ordered Stop   10/02/21 2200  rifampin (RIFADIN) capsule 300 mg        300 mg Oral Every 12 hours 10/02/21 1641     10/02/21 0600  ceFAZolin (ANCEF) IVPB 2g/100 mL premix        2 g 200 mL/hr over 30 Minutes Intravenous On call to O.R. 10/01/21 1518 10/01/21 1834   10/01/21 2300  ceFAZolin (ANCEF) IVPB 2g/100 mL premix        2 g 200 mL/hr over 30 Minutes Intravenous Every 8 hours 10/01/21 2043 10/15/21 2159   10/01/21 1530  ceFAZolin (ANCEF) 2-4 GM/100ML-% IVPB       Note to Pharmacy: [name] W: cabinet override      10/01/21 1530 10/01/21 1824       REVIEW OF SYSTEMS:  Const: negative fever, negative chills, negative weight loss Eyes: negative diplopia or visual changes, negative eye  pain ENT: negative coryza, negative sore throat Resp: negative cough, hemoptysis, has dyspnea Cards: negative for chest pain, palpitations, has lower extremity edema GU: negative for frequency, dysuria and hematuria GI: Negative for abdominal pain, diarrhea, bleeding, constipation Skin: Scabbing and dry is flaky skin on the right lower extremity Heme: negative for easy bruising and gum/nose bleeding MS: Generalized weakness Neurolo:negative for headaches, dizziness, vertigo, memory problems  Psych: negative for feelings of anxiety, depression  Endocrine: negative for thyroid, diabetes Allergy/Immunology-as above Objective:  VITALS:  BP (!) 93/51 (BP Location: Left Arm)    Pulse 85    Temp 98.5 F (36.9 C)    Resp 14    Ht 5\' 11"  (1.803 m)    Wt 79.4 kg    SpO2 100%    BMI 24.41 kg/m  PHYSICAL EXAM:  General: Alert, cooperative, no distress, thin built Head: Normocephalic, without obvious abnormality, atraumatic. Eyes: Conjunctivae clear, anicteric sclerae. Pupils are equal ENT Nares normal. No drainage or sinus  tenderness. Lips, mucosa, and tongue normal. No Thrush Neck: , symmetrical, no adenopathy, thyroid: non tender no carotid bruit and no JVD.  Lungs: Bilateral air entry. Crepitations bases Heart: M7-E7 Systolic murmur mitral area Abdomen: Soft, non-tender,not distended. Bowel sounds normal. No masses Extremities: Right leg dressing not removed  chronic venous stasis and pigmentation left leg some of Skin: Extremity bruising lymph: Cervical, supraclavicular normal. Neurologic: Grossly non-focal Pertinent Labs Lab Results CBC    Component Value Date/Time   WBC 5.7 10/02/2021 0211   RBC 4.09 (L) 10/02/2021 0211   HGB 9.8 (L) 10/02/2021 0211   HGB 12.0 (L) 11/17/2020 1135   HCT 32.6 (L) 10/02/2021 0211   HCT 37.4 (L) 11/17/2020 1135   PLT 261 10/02/2021 0211   PLT 242 11/17/2020 1135   MCV 79.7 (L) 10/02/2021 0211   MCV 81 11/17/2020 1135   MCV 88 08/17/2012 1506   MCH 24.0 (L) 10/02/2021 0211   MCHC 30.1 10/02/2021 0211   RDW 19.4 (H) 10/02/2021 0211   RDW 14.4 11/17/2020 1135   RDW 13.4 08/17/2012 1506   LYMPHSABS 1.4 08/09/2021 1231   MONOABS 1.0 08/09/2021 1231   EOSABS 0.1 08/09/2021 1231   BASOSABS 0.0 08/09/2021 1231    CMP Latest Ref Rng & Units 10/02/2021 08/20/2021 08/19/2021  Glucose 70 - 99 mg/dL 145(H) 144(H) 116(H)  BUN 8 - 23 mg/dL 28(H) 21 27(H)  Creatinine 0.61 - 1.24 mg/dL 1.15 1.07 1.18  Sodium 135 - 145 mmol/L 133(L) 133(L) 133(L)  Potassium 3.5 - 5.1 mmol/L 4.3 4.6 4.3  Chloride 98 - 111 mmol/L 101 100 97(L)  CO2 22 - 32 mmol/L 25 25 27   Calcium 8.9 - 10.3 mg/dL 8.5(L) 8.7(L) 8.7(L)  Total Protein 6.5 - 8.1 g/dL - - -  Total Bilirubin 0.3 - 1.2 mg/dL - - -  Alkaline Phos 38 - 126 U/L - - -  AST 15 - 41 U/L - - -  ALT 0 - 44 U/L - - -      Microbiology: Recent Results (from the past 240 hour(s))  Surgical pcr screen     Status: Abnormal   Collection Time: 09/27/21  3:00 PM   Specimen: Nasal Mucosa; Nasal Swab  Result Value Ref Range Status    MRSA, PCR NEGATIVE NEGATIVE Final   Staphylococcus aureus POSITIVE (A) NEGATIVE Final    Comment: (NOTE) The Xpert SA Assay (FDA approved for NASAL specimens in patients 48 years of age and older), is one component of a comprehensive  surveillance program. It is not intended to diagnose infection nor to guide or monitor treatment. Performed at [facility], [place], [place] [address]   SARS CORONAVIRUS 2 (TAT 6-24 HRS) Nasopharyngeal Nasopharyngeal Swab     Status: None   Collection Time: 09/27/21  3:00 PM   Specimen: Nasopharyngeal Swab  Result Value Ref Range Status   SARS Coronavirus 2 NEGATIVE NEGATIVE Final    Comment: (NOTE) SARS-CoV-2 target nucleic acids are NOT DETECTED.  The SARS-CoV-2 RNA is generally detectable in upper and lower respiratory specimens during the acute phase of infection. Negative results do not preclude SARS-CoV-2 infection, do not rule out co-infections with other pathogens, and should not be used as the sole basis for treatment or other patient management decisions. Negative results must be combined with clinical observations, patient history, and epidemiological information. The expected result is Negative.  Fact Sheet for Patients: SugarRoll.be  Fact Sheet for Healthcare Providers: https://www.woods-mathews.com/  This test is not yet approved or cleared by the [place] FDA and  has been authorized for detection and/or diagnosis of SARS-CoV-2 by FDA under an Emergency Use Authorization (EUA). This EUA will remain  in effect (meaning this test can be used) for the duration of the COVID-19 declaration under Se ction 564(b)(1) of the Act, 21 U.S.C. section 360bbb-3(b)(1), unless the authorization is terminated or revoked sooner.  Performed at [facility], [place] [address] [place], [place] [address]   Aerobic/Anaerobic Culture w Gram Stain (surgical/deep wound)      Status: None (Preliminary result)   Collection Time: 10/01/21  6:40 PM   Specimen: PATH Other; Tissue  Result Value Ref Range Status   Specimen Description   Final    TISSUE RIGHT KNEE Performed at [facility], [place] [address] [place], [place] [address]    Special Requests   Final    NONE Performed at [facility], [place], [place] [address]    Gram Stain NO WBC SEEN NO ORGANISMS SEEN   Final   Culture   Final    NO GROWTH < 24 HOURS Performed at [facility], [place] [address], [place],  [address]    Report Status PENDING  Incomplete    IMAGING RESULTS: I have personally reviewed the films ? Impression/Recommendation ? Prosthetic joint septic arthritis of the right knee. MSSA infection As the hardware is retained patient is going to need 6 months of antibiotics.  Initially it will be 6 weeks of IV cefazolin plus p.o. rifampin followed by p.o. rifampin with an oral antistaphylococcal beta-lactam for 5 more months.  Complicated cardiovascular history Recent high risk PCI with stenting of the LAD under Impella support  Congestive heart failure with EF of 25 to 30%  Anemia  Bilateral lower extremity venous edema/venous stasis  History of CVA  History of PAD  Left TKA  Discussed the management with the patient and his daughter.   Note:  This document was prepared using Dragon voice recognition software and may include unintentional dictation errors.

## 2021-10-02 NOTE — Progress Notes (Signed)
Continuous pulse oximetry confirmed with Johnathan RN and CCMD.

## 2021-10-02 NOTE — Evaluation (Signed)
Occupational Therapy Evaluation Patient Details Name: Henry Lindsey MRN: [id number] DOB: [birth year] Today's Date: 10/02/2021   History of Present Illness Pt admitted for R TKR revision secondary to infection with I&D. 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 PMHx: CHF, EF 35%, HTN, HLD, DM, COPD, stroke, GERD, PVD, R PTA SFA, CAD, carotid artery stenosis, CKD 3, diverticulitis.   Clinical Impression   Pt seen for OT evaluation this date, POD#1 from above surgery. Pt was independent in all ADL prior to surgery and is eager to return to PLOF with less pain and improved safety and independence. Pt currently requires PRN minimal assist for LB dressing and bathing while in seated position due to pain and limited AROM of R knee, MIN A for ADL transfers with VC for RW mgt, hand/foot placement for improved safety. Pt able to perform bed mobility without direct physical assist. Pt instructed in polar care mgt, falls prevention strategies, home/routines modifications, DME/AE for LB bathing and dressing tasks, and pet care considerations since pt plans to return to girlfriend's home during recover and she has a small dog. Pt verbalized understanding. Handout provided to support recall and carryover. Pt would benefit from skilled OT services including additional instruction in dressing techniques with or without assistive devices for dressing and bathing skills to support recall and carryover prior to discharge and ultimately to maximize safety, independence, and minimize falls risk and caregiver burden. Do not currently anticipate any OT needs following this hospitalization.     Recommendations for follow up therapy are one component of a multi-disciplinary discharge planning process, led by the attending physician.  Recommendations may be updated based on patient status, additional functional criteria and insurance authorization.   Follow Up Recommendations  No OT follow up    Assistance Recommended at Discharge  Intermittent Supervision/Assistance  Functional Status Assessment  Patient has had a recent decline in their functional status and demonstrates the ability to make significant improvements in function in a reasonable and predictable amount of time.  Equipment Recommendations  None recommended by OT    Recommendations for Other Services       Precautions / Restrictions Precautions Precautions: Fall;Knee Precaution Booklet Issued: Yes (comment) Restrictions Weight Bearing Restrictions: Yes RLE Weight Bearing: Weight bearing as tolerated      Mobility Bed Mobility Overal bed mobility: Needs Assistance Bed Mobility: Supine to Sit;Sit to Supine     Supine to sit: Supervision Sit to supine: Supervision   General bed mobility comments: increased effort/time, use of bed rails but able to hook L ankle under R and performed without direct physical assist from therapist, only VC for technique    Transfers Overall transfer level: Needs assistance Equipment used: Rolling walker (2 wheels) Transfers: Sit to/from Stand Sit to Stand: Min assist           General transfer comment: VC for foot/hand placement, RW mgt      Balance Overall balance assessment: Needs assistance Sitting-balance support: Feet supported;Single extremity supported Sitting balance-Leahy Scale: Good     Standing balance support: Bilateral upper extremity supported;Single extremity supported;Reliant on assistive device for balance Standing balance-Leahy Scale: Fair Standing balance comment: able to stand with LUE support on RW and use RUE to hold cup to drink, reliant on RW with BUE support with dynamic balance                           ADL either performed or assessed with clinical judgement   ADL  General ADL Comments: Pt able to doff/don sock on surgical leg from seated position with some difficulty noted but no direct assist required.  Pt requires PRN MIN A for LB ADL, family able to provide assist. MIN A for ADL transfers + VC for RW mgt     Vision         Perception     Praxis      Pertinent Vitals/Pain Pain Assessment: 0-10 Pain Score: 5  Pain Location: R knee Pain Descriptors / Indicators: Operative site guarding;Aching Pain Intervention(s): Limited activity within patient's tolerance;Monitored during session;Premedicated before session;Repositioned;Ice applied     Hand Dominance Right   Extremity/Trunk Assessment Upper Extremity Assessment Upper Extremity Assessment: Overall WFL for tasks assessed   Lower Extremity Assessment Lower Extremity Assessment: RLE deficits/detail RLE Deficits / Details: s/p TK revision, expected post-op strength/ROM deficits RLE Sensation: WNL       Communication Communication Communication: No difficulties   Cognition Arousal/Alertness: Awake/alert Behavior During Therapy: WFL for tasks assessed/performed Overall Cognitive Status: Within Functional Limits for tasks assessed                                 General Comments: general lack of safety awareness and level of impairments     General Comments       Exercises Other Exercises: extended time spent in standing and adjusting to [place] to ease anxiety and fear of movement Other Exercises: Pt instructed in bed mobility, ADL transfers, AE/DME for ADL, home/routines modifications, polar care mgt, and falls prevention strategies. Handout provided.   Shoulder Instructions      Home Living Family/patient expects to be discharged to:: Private residence Living Arrangements: Spouse/significant other Available Help at Discharge: Family;Friend(s);Available 24 hours/day Type of Home: House Home Access: Level entry     Home Layout: One level         Bathroom Toilet: Handicapped height     Home Equipment: Conservation officer, nature (2 wheels);Cane - single point;BSC/3in1   Additional Comments: has his own  home, however is planning to return to living with his girlfriend      Prior Functioning/Environment Prior Level of Function : Needs assist       Physical Assist : Mobility (physical) Mobility (physical): Transfers;Gait   Mobility Comments: was still utilizing a RW and receiving HHPT prior to hospitalization          OT Problem List: Decreased strength;Decreased range of motion;Pain;Impaired balance (sitting and/or standing);Decreased knowledge of use of DME or AE      OT Treatment/Interventions: Self-care/ADL training;Therapeutic exercise;Therapeutic activities;DME and/or AE instruction;Patient/family education;Balance training    OT Goals(Current goals can be found in the care plan section) Acute Rehab OT Goals Patient Stated Goal: go home with girlfriend and recover OT Goal Formulation: With patient Time For Goal Achievement: 10/16/21 Potential to Achieve Goals: Good ADL Goals Pt Will Perform Lower Body Dressing: with modified independence;sit to/from stand Pt Will Transfer to Toilet: with modified independence;ambulating (elevated commode, LRAD for amb) Additional ADL Goal #1: Pt will independently instruct family/caregivers in polar care mgt.  OT Frequency: Min 2X/week   Barriers to D/C:            Co-evaluation              AM-PAC OT "6 Clicks" Daily Activity     Outcome Measure Help from another person eating meals?: None Help from another person taking care of personal grooming?: None  Help from another person toileting, which includes using toliet, bedpan, or urinal?: A Little Help from another person bathing (including washing, rinsing, drying)?: A Little Help from another person to put on and taking off regular upper body clothing?: None Help from another person to put on and taking off regular lower body clothing?: A Little 6 Click Score: 21   End of Session Equipment Utilized During Treatment: Gait belt;Oxygen  Activity Tolerance: Patient tolerated  treatment well Patient left: in bed;with call bell/phone within reach;with bed alarm set;Other (comment) (rolled towel under R ankle, polar care on, on RA)  OT Visit Diagnosis: Other abnormalities of gait and mobility (R26.89);Muscle weakness (generalized) (M62.81);Pain Pain - Right/Left: Right Pain - part of body: Knee                Time: 5188-4166 OT Time Calculation (min): 21 min Charges:  OT General Charges $OT Visit: 1 Visit OT Evaluation $OT Eval Moderate Complexity: 1 Mod OT Treatments $Self Care/Home Management : 8-22 mins  Ardeth Perfect., MPH, MS, OTR/L ascom [phone] 10/02/21, 2:22 PM

## 2021-10-02 NOTE — Plan of Care (Signed)

## 2021-10-02 NOTE — Evaluation (Addendum)
Physical Therapy Evaluation Patient Details Name: Henry Lindsey MRN: [id number] DOB: [name] 17, 1933 Today's Date: 10/02/2021  History of Present Illness  Pt admitted for R TKR revision secondary to infection with I&D. 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 PMHx: CHF, EF 35%, HTN, HLD, DM, COPD, stroke, GERD, PVD, R PTA SFA, CAD, carotid artery stenosis, CKD 3, diverticulitis.  Clinical Impression  Pt is a pleasant 87 year old male who was admitted for R TKR revision. Pt performs bed mobility/transfers with mod assist and ambulation with min assist and RW. Pt very hesitant to participate due to pain, however then reports it wasn't as bad as he'd anticipated. Pt demonstrates deficits with strength/pain/mobility/ROM. Would benefit from skilled PT to address above deficits and promote optimal return to PLOF. Recommend transition to [place] upon discharge from acute hospitalization. All mobility performed on 1.5L of O2 with sats at 93%. Pt appears to be very limited by fear of movement and states frequently that "its too soon to be moving around".  Education given on benefits of PT and barriers to dc. Explained that further ambulation will be performed in PM session or he is at risk for safety concerns for home discharge. Will continue to assess.      Recommendations for follow up therapy are one component of a multi-disciplinary discharge planning process, led by the attending physician.  Recommendations may be updated based on patient status, additional functional criteria and insurance authorization.  Follow Up Recommendations Home health PT    Assistance Recommended at Discharge Frequent or constant Supervision/Assistance  Functional Status Assessment Patient has had a recent decline in their functional status and demonstrates the ability to make significant improvements in function in a reasonable and predictable amount of time.  Equipment Recommendations  None recommended by PT    Recommendations for Other  Services       Precautions / Restrictions Precautions Precautions: Fall;Knee Precaution Booklet Issued: No Restrictions Weight Bearing Restrictions: Yes RLE Weight Bearing: Weight bearing as tolerated      Mobility  Bed Mobility Overal bed mobility: Needs Assistance Bed Mobility: Supine to Sit     Supine to sit: Mod assist     General bed mobility comments: max cues for participation as pt very anxious with mobility. "wait a minute, wait a minute". Mod assist for transitioning to sitting with ability to hook L ankle under R. once seated, upright posture noted    Transfers Overall transfer level: Needs assistance Equipment used: Rolling walker (2 wheels) Transfers: Sit to/from Stand Sit to Stand: Mod assist           General transfer comment: several attempts for success due to fear of movement. Once standing, progresses to cga    Ambulation/Gait Ambulation/Gait assistance: Min assist Gait Distance (Feet): 3 Feet Assistive device: Rolling walker (2 wheels) Gait Pattern/deviations: Step-to pattern       General Gait Details: very fearful of movement, self limiting. Able to take side steps up towards [name]. good weight acceptance of surgical leg  Stairs            Wheelchair Mobility    Modified Rankin (Stroke Patients Only)       Balance Overall balance assessment: Needs assistance Sitting-balance support: Feet supported Sitting balance-Leahy Scale: Good     Standing balance support: Bilateral upper extremity supported Standing balance-Leahy Scale: Fair  Pertinent Vitals/Pain Pain Assessment: 0-10 Pain Score: 8  Pain Location: R knee Pain Descriptors / Indicators: Operative site guarding Pain Intervention(s): Limited activity within patient's tolerance;Premedicated before session;Repositioned;Ice applied    Home Living Family/patient expects to be discharged to:: Private residence Living  Arrangements: Spouse/significant other Available Help at Discharge: Family;Friend(s);Available 24 hours/day Type of Home: House Home Access: Level entry       Home Layout: One level Home Equipment: Conservation officer, nature (2 wheels);Cane - single point;BSC/3in1 Additional Comments: has his own home, however is planning to return to living with his girlfriend    Prior Function Prior Level of Function : Needs assist       Physical Assist : Mobility (physical) Mobility (physical): Transfers;Gait   Mobility Comments: was still utilizing a RW and receiving HHPT prior to hospitalization       Hand Dominance        Extremity/Trunk Assessment   Upper Extremity Assessment Upper Extremity Assessment: Overall WFL for tasks assessed    Lower Extremity Assessment Lower Extremity Assessment: Generalized weakness (R LE grossly 3-/5; L LE grossly 4+/5)       Communication   Communication: No difficulties  Cognition Arousal/Alertness: Awake/alert Behavior During Therapy: WFL for tasks assessed/performed Overall Cognitive Status: Within Functional Limits for tasks assessed                                 General Comments: general lack of safety awareness and level of impairments        General Comments      Exercises Total Joint Exercises Goniometric ROM: guarding present. R knee AAROM: 30-50 degrees Other Exercises Other Exercises: supine/seated ther-ex performed on R LE including AP, SLRs, hip abd/add, and LAQ. 10 reps with mod assist Other Exercises: extended time spent in standing and adjusting to [place] to ease anxiety and fear of movement   Assessment/Plan    PT Assessment Patient needs continued PT services  PT Problem List Decreased strength;Decreased balance;Decreased mobility;Pain       PT Treatment Interventions DME instruction;Gait training;Stair training;Therapeutic exercise    PT Goals (Current goals can be found in the Care Plan section)  Acute  Rehab PT Goals Patient Stated Goal: to go home PT Goal Formulation: With patient Time For Goal Achievement: 10/16/21 Potential to Achieve Goals: Good    Frequency BID   Barriers to discharge        Co-evaluation               AM-PAC PT "6 Clicks" Mobility  Outcome Measure Help needed turning from your back to your side while in a flat bed without using bedrails?: A Little Help needed moving from lying on your back to sitting on the side of a flat bed without using bedrails?: A Lot Help needed moving to and from a bed to a chair (including a wheelchair)?: A Lot Help needed standing up from a chair using your arms (e.g., wheelchair or bedside chair)?: A Lot Help needed to walk in hospital room?: A Lot Help needed climbing 3-5 steps with a railing? : A Lot 6 Click Score: 13    End of Session Equipment Utilized During Treatment: Gait belt;Oxygen Activity Tolerance: Patient limited by pain Patient left: in bed;with bed alarm set (seated on EOB eating breakfast, RN aware. Needs recliner chair) Nurse Communication: Mobility status PT Visit Diagnosis: Muscle weakness (generalized) (M62.81);Difficulty in walking, not elsewhere classified (R26.2);Pain Pain - Right/Left:  Right Pain - part of body: Knee    Time: 9412-9047 PT Time Calculation (min) (ACUTE ONLY): 38 min   Charges:   PT Evaluation $PT Eval Low Complexity: 1 Low PT Treatments $Therapeutic Exercise: 8-22 mins $Therapeutic Activity: 8-22 mins        Greggory Stallion, PT, DPT [phone]   Mikaiah Stoffer 10/02/2021, 10:51 AM

## 2021-10-02 NOTE — Telephone Encounter (Signed)
Called the patient to remind him of the needed repeat bmp. Spoke with the pt. Patient is currently admitted at [facility]. Closing this encounter.

## 2021-10-02 NOTE — Progress Notes (Signed)
Subjective:  Patient reports pain as mild.  His partner is in the room.  Objective:   VITALS:   Vitals:   10/02/21 0755 10/02/21 0757 10/02/21 0900 10/02/21 1138  BP: 99/77 99/77 108/71 (!) 93/51  Pulse: 89 89  85  Resp: 16 16  14   Temp: 98.5 F (36.9 C) 98.5 F (36.9 C)  98.5 F (36.9 C)  TempSrc:      SpO2:  100%    Weight:      Height:        PHYSICAL EXAM:  ABD soft Sensation intact distally Dorsiflexion/Plantar flexion intact Incision: dressing C/D/I Compartment soft  LABS  Results for orders placed or performed during the hospital encounter of 10/01/21 (from the past 24 hour(s))  Glucose, capillary     Status: Abnormal   Collection Time: 10/01/21  3:51 PM  Result Value Ref Range   Glucose-Capillary 112 (H) 70 - 99 mg/dL  Aerobic/Anaerobic Culture w Gram Stain (surgical/deep wound)     Status: None (Preliminary result)   Collection Time: 10/01/21  6:40 PM   Specimen: PATH Other; Tissue  Result Value Ref Range   Specimen Description      TISSUE RIGHT KNEE Performed at [facility], [address] [place], [place] [address]    Special Requests      NONE Performed at [facility], [place], [place] [address]    Gram Stain NO WBC SEEN NO ORGANISMS SEEN     Culture      NO GROWTH < 24 HOURS Performed at [facility], [place] [address], [place], [place] [address]    Report Status PENDING   Glucose, capillary     Status: Abnormal   Collection Time: 10/01/21  7:52 PM  Result Value Ref Range   Glucose-Capillary 142 (H) 70 - 99 mg/dL   Comment 1 Notify RN    Comment 2 Document in Chart   Glucose, capillary     Status: Abnormal   Collection Time: 10/01/21  9:11 PM  Result Value Ref Range   Glucose-Capillary 135 (H) 70 - 99 mg/dL  CBC     Status: Abnormal   Collection Time: 10/02/21  2:11 AM  Result Value Ref Range   WBC 5.7 4.0 - 10.5 K/uL   RBC 4.09 (L) 4.22 - 5.81 MIL/uL   Hemoglobin 9.8 (L) 13.0 - 17.0 g/dL    HCT 32.6 (L) 39.0 - 52.0 %   MCV 79.7 (L) 80.0 - 100.0 fL   MCH 24.0 (L) 26.0 - 34.0 pg   MCHC 30.1 30.0 - 36.0 g/dL   RDW 19.4 (H) 11.5 - 15.5 %   Platelets 261 150 - 400 K/uL   nRBC 0.0 0.0 - 0.2 %  Basic metabolic panel     Status: Abnormal   Collection Time: 10/02/21  2:11 AM  Result Value Ref Range   Sodium 133 (L) 135 - 145 mmol/L   Potassium 4.3 3.5 - 5.1 mmol/L   Chloride 101 98 - 111 mmol/L   CO2 25 22 - 32 mmol/L   Glucose, Bld 145 (H) 70 - 99 mg/dL   BUN 28 (H) 8 - 23 mg/dL   Creatinine, Ser 1.15 0.61 - 1.24 mg/dL   Calcium 8.5 (L) 8.9 - 10.3 mg/dL   GFR, Estimated >60 >60 mL/min   Anion gap 7 5 - 15  Glucose, capillary     Status: Abnormal   Collection Time: 10/02/21  7:57 AM  Result Value Ref Range   Glucose-Capillary 135 (H) 70 - 99 mg/dL  Glucose, capillary     Status: Abnormal   Collection Time: 10/02/21 11:37 AM  Result Value Ref Range   Glucose-Capillary 145 (H) 70 - 99 mg/dL  Glucose, capillary     Status: Abnormal   Collection Time: 10/02/21 12:42 PM  Result Value Ref Range   Glucose-Capillary 137 (H) 70 - 99 mg/dL    No results found.  Assessment/Plan: 1 Day Post-Op   Principal Problem:   S/P revision of total knee, right   Up with therapy Continue ABX therapy due to Post-op infection per ID Discharge planning   Lovell Sheehan , MD 10/02/2021, 1:12 PM

## 2021-10-03 ENCOUNTER — Inpatient Hospital Stay: Payer: Self-pay

## 2021-10-03 DIAGNOSIS — Z96659 Presence of unspecified artificial knee joint: Secondary | ICD-10-CM

## 2021-10-03 LAB — CBC
HCT: 29.3 % — ABNORMAL LOW (ref 39.0–52.0)
Hemoglobin: 9 g/dL — ABNORMAL LOW (ref 13.0–17.0)
MCH: 24.6 pg — ABNORMAL LOW (ref 26.0–34.0)
MCHC: 30.7 g/dL (ref 30.0–36.0)
MCV: 80.1 fL (ref 80.0–100.0)
Platelets: 216 10*3/uL (ref 150–400)
RBC: 3.66 MIL/uL — ABNORMAL LOW (ref 4.22–5.81)
RDW: 19 % — ABNORMAL HIGH (ref 11.5–15.5)
WBC: 5.4 10*3/uL (ref 4.0–10.5)
nRBC: 0 % (ref 0.0–0.2)

## 2021-10-03 LAB — HEPATIC FUNCTION PANEL
ALT: 14 U/L (ref 0–44)
AST: 28 U/L (ref 15–41)
Albumin: 2.7 g/dL — ABNORMAL LOW (ref 3.5–5.0)
Alkaline Phosphatase: 139 U/L — ABNORMAL HIGH (ref 38–126)
Bilirubin, Direct: 0.6 mg/dL — ABNORMAL HIGH (ref 0.0–0.2)
Indirect Bilirubin: 0.8 mg/dL (ref 0.3–0.9)
Total Bilirubin: 1.4 mg/dL — ABNORMAL HIGH (ref 0.3–1.2)
Total Protein: 7 g/dL (ref 6.5–8.1)

## 2021-10-03 LAB — GLUCOSE, CAPILLARY
Glucose-Capillary: 124 mg/dL — ABNORMAL HIGH (ref 70–99)
Glucose-Capillary: 152 mg/dL — ABNORMAL HIGH (ref 70–99)
Glucose-Capillary: 129 mg/dL — ABNORMAL HIGH (ref 70–99)
Glucose-Capillary: 212 mg/dL — ABNORMAL HIGH (ref 70–99)

## 2021-10-03 MED ORDER — SODIUM CHLORIDE 0.9% FLUSH: 10.0000 mL | INTRAVENOUS | Status: DC | PRN

## 2021-10-03 MED ORDER — CHLORHEXIDINE GLUCONATE CLOTH 2 % EX PADS
6.0000 | MEDICATED_PAD | Freq: Every day | CUTANEOUS | Status: DC
  Administered 2021-10-03 – 2021-10-04 (×2): 6 via TOPICAL

## 2021-10-03 NOTE — Progress Notes (Addendum)
PT Cancellation Note  Patient Details Name: Henry Lindsey MRN: [id number] DOB: 03-[name]-1933   Cancelled Treatment:     PT attempted 3 times this afternoon. Pt did have PICC placement and also OT. He reports being too fatigued to participate requesting P[place]or Curia return in the morning. He did do well in AM PT session and is progressing well towards DC home once cleared medically.     Willette Pa 10/03/2021, 4:07 PM

## 2021-10-03 NOTE — Progress Notes (Signed)
Occupational Therapy Treatment Patient Details Name: SHOLOM DULUDE MRN: [id number] DOB: 1933-05-04 Today's Date: 10/03/2021   History of present illness Pt admitted for R TKR revision secondary to infection with I&D. 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 PMHx: CHF, EF 35%, HTN, HLD, DM, COPD, stroke, GERD, PVD, R PTA SFA, CAD, carotid artery stenosis, CKD 3, diverticulitis.   OT comments  Mr. Hillmer was seen for OT treatment on this date. Upon arrival to room pt awake/alert, semi-supine in bed. Pt endorses generalized fatigue from earlier therapy session this date. He declines OOB/EOB activity despite max encouragement/education on benefits. Pt agreeable to discussion of AE to support safety and functional independence upon DC home. Therapist demonstrates safe use of LH reacher and sock aid for LB dressing task. Pt with decreased recall from past therapy sessions. Requires moderate cueing to return verbalize understanding of education provided. Declines to return demonstrate this date. Pt progressing toward OT goals, and continues to benefit from skilled OT services to maximize return to PLOF and minimize risk of future falls, injury, caregiver burden, and readmission. Will continue to follow POC. Discharge recommendation remains appropriate.     Recommendations for follow up therapy are one component of a multi-disciplinary discharge planning process, led by the attending physician.  Recommendations may be updated based on patient status, additional functional criteria and insurance authorization.    Follow Up Recommendations  No OT follow up    Assistance Recommended at Discharge Intermittent Supervision/Assistance  Equipment Recommendations  None recommended by OT    Recommendations for Other Services      Precautions / Restrictions Precautions Precautions: Fall;Knee Precaution Booklet Issued: Yes (comment) Restrictions Weight Bearing Restrictions: Yes RLE Weight Bearing: Weight bearing as  tolerated       Mobility Bed Mobility                    Transfers                         Balance Overall balance assessment: Needs assistance Sitting-balance support: Feet supported;Bilateral upper extremity supported                                       ADL either performed or assessed with clinical judgement   ADL                                         General ADL Comments: Pt provided with reinforcement of past education on safe use of AE for LB ADL management including demonstration of sock aid and LH reacher for LB dressing with emphasis on RLE dressing and precautions. Pt return verbalizes understanding of education provided.    Extremity/Trunk Assessment Upper Extremity Assessment Upper Extremity Assessment: Generalized weakness            Vision       Perception     Praxis      Cognition Arousal/Alertness: Awake/alert Behavior During Therapy: WFL for tasks assessed/performed Overall Cognitive Status: Within Functional Limits for tasks assessed                                 General Comments: Generally fatigued, decreased motivation to participate actively in session.  Exercises Other Exercises Other Exercises: Pt provided with reinforcement of prior education on safe use of AE/DME for ADL, home/routines modifications, polar care mgt, and falls prevention strategies.   Shoulder Instructions       General Comments      Pertinent Vitals/ Pain       Pain Score: 6  Pain Location: R knee Pain Descriptors / Indicators: Operative site guarding;Aching Pain Intervention(s): Limited activity within patient's tolerance;Monitored during session;Ice applied  Home Living                                          Prior Functioning/Environment              Frequency  Min 2X/week        Progress Toward Goals  OT Goals(current goals can now be found in  the care plan section)  Progress towards OT goals: Progressing toward goals  Acute Rehab OT Goals Patient Stated Goal: To go home with girlfriend and recover OT Goal Formulation: With patient Time For Goal Achievement: 10/16/21 Potential to Achieve Goals: Good  Plan Discharge plan remains appropriate;Frequency remains appropriate    Co-evaluation                 AM-PAC OT "6 Clicks" Daily Activity     Outcome Measure   Help from another person eating meals?: None Help from another person taking care of personal grooming?: None Help from another person toileting, which includes using toliet, bedpan, or urinal?: A Little Help from another person bathing (including washing, rinsing, drying)?: A Little Help from another person to put on and taking off regular upper body clothing?: None Help from another person to put on and taking off regular lower body clothing?: A Little 6 Click Score: 21    End of Session    OT Visit Diagnosis: Other abnormalities of gait and mobility (R26.89);Muscle weakness (generalized) (M62.81);Pain Pain - Right/Left: Right Pain - part of body: Knee   Activity Tolerance Patient limited by fatigue   Patient Left with call bell/phone within reach;in bed;with bed alarm set   Nurse Communication          Time: 4235-3614 OT Time Calculation (min): 18 min  Charges: OT General Charges $OT Visit: 1 Visit OT Treatments $Self Care/Home Management : 8-22 mins  Shara Blazing, M.S., OTR/L [facility] Ascom: [phone] 10/03/21, 3:14 PM

## 2021-10-03 NOTE — Progress Notes (Signed)
Physical Therapy Treatment Patient Details Name: Henry Lindsey MRN: [id number] DOB: Dec 09, 1932 Today's Date: 10/03/2021   History of Present Illness Pt admitted for R TKR revision secondary to infection with I&D. 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 PMHx: CHF, EF 35%, HTN, HLD, DM, COPD, stroke, GERD, PVD, R PTA SFA, CAD, carotid artery stenosis, CKD 3, diverticulitis.    PT Comments    Pt was long sitting in bed upon arriving. He agrees to session and is cooperative and motivated throughout. He was easily able to exit bed, stand to RW, and ambulate >200 ft with RW. Pt demonstrates good understanding of proper performance of HEP. He states he has 24/7 assistance at DC. Acute PT will continue to follow however pt is cleared from a our standpoint for safe DC home once cleared by MD.    Recommendations for follow up therapy are one component of a multi-disciplinary discharge planning process, led by the attending physician.  Recommendations may be updated based on patient status, additional functional criteria and insurance authorization.  Follow Up Recommendations  Home health PT     Assistance Recommended at Discharge Set up Supervision/Assistance  Equipment Recommendations  None recommended by PT       Precautions / Restrictions Precautions Precautions: Fall;Knee Precaution Booklet Issued: Yes (comment) Restrictions Weight Bearing Restrictions: Yes RLE Weight Bearing: Weight bearing as tolerated     Mobility  Bed Mobility Overal bed mobility: Needs Assistance Bed Mobility: Supine to Sit;Sit to Supine     Supine to sit: Supervision     General bed mobility comments: no physical assistance to exit bed. Increased time but no physical assistance required.    Transfers Overall transfer level: Needs assistance Equipment used: Rolling walker (2 wheels) Transfers: Sit to/from Stand Sit to Stand: Supervision    General transfer comment: slightly elevated bed height. Pt easily stood to  [place] for increased fwd wt shift and hand placement.    Ambulation/Gait Ambulation/Gait assistance: Supervision Gait Distance (Feet): 200 Feet Assistive device: Rolling walker (2 wheels) Gait Pattern/deviations: Step-through pattern       General Gait Details: pt was easily and safely able to ambulate around RNB station with supervision. HR stable with sao2 97% on rm air     Balance Overall balance assessment: Needs assistance Sitting-balance support: Feet supported;Bilateral upper extremity supported Sitting balance-Leahy Scale: Good     Standing balance support: Bilateral upper extremity supported;During functional activity Standing balance-Leahy Scale: Good       Cognition Arousal/Alertness: Awake/alert Behavior During Therapy: WFL for tasks assessed/performed Overall Cognitive Status: Within Functional Limits for tasks assessed        Exercises Total Joint Exercises Ankle Circles/Pumps: AROM;10 reps Quad Sets: AROM;10 reps Gluteal Sets: 10 reps Heel Slides: AROM;10 reps Hip ABduction/ADduction: AROM;10 reps Straight Leg Raises: AAROM;10 reps     Pertinent Vitals/Pain Pain Assessment: 0-10 Pain Score: 3  Pain Location: R knee Pain Descriptors / Indicators: Operative site guarding;Aching Pain Intervention(s): Limited activity within patient's tolerance;Monitored during session;Premedicated before session;Repositioned;Ice applied     PT Goals (current goals can now be found in the care plan section) Acute Rehab PT Goals Patient Stated Goal: to go home Progress towards PT goals: Progressing toward goals    Frequency    BID      PT Plan Current plan remains appropriate       AM-PAC PT "6 Clicks" Mobility   Outcome Measure  Help needed turning from your back to your side while in a flat bed without  using bedrails?: A Little Help needed moving from lying on your back to sitting on the side of a flat bed without using bedrails?: A Little Help needed  moving to and from a bed to a chair (including a wheelchair)?: A Little Help needed standing up from a chair using your arms (e.g., wheelchair or bedside chair)?: A Little Help needed to walk in hospital room?: A Little Help needed climbing 3-5 steps with a railing? : A Little 6 Click Score: 18    End of Session Equipment Utilized During Treatment: Gait belt Activity Tolerance: Patient tolerated treatment well Patient left: in chair;with call bell/phone within reach;with chair alarm set Nurse Communication: Mobility status PT Visit Diagnosis: Muscle weakness (generalized) (M62.81);Difficulty in walking, not elsewhere classified (R26.2);Pain Pain - Right/Left: Right Pain - part of body: Knee     Time: 3888-7579 PT Time Calculation (min) (ACUTE ONLY): 28 min  Charges:  $Gait Training: 8-22 mins $Therapeutic Activity: 8-22 mins                     Julaine Fusi PTA 10/03/21, 9:57 AM

## 2021-10-03 NOTE — Progress Notes (Signed)
Date of Admission:  10/01/2021   ID: Henry Lindsey is a 87 y.o. male  Principal Problem:   S/P revision of total knee, right    Subjective: Patient doing better Got PICC line today on the right upper extremity  Medications:   aspirin EC  81 mg Oral Daily   atorvastatin  40 mg Oral q AM   clopidogrel  75 mg Oral Daily   docusate sodium  100 mg Oral BID   furosemide  20 mg Oral Daily   gabapentin  400 mg Oral BID   insulin aspart  0-15 Units Subcutaneous TID WC   insulin aspart protamine- aspart  15 Units Subcutaneous BID WC   latanoprost  1 drop Both Eyes QHS   metoprolol succinate  50 mg Oral Daily   pantoprazole  40 mg Oral Daily   rifampin  300 mg Oral Q12H   sacubitril-valsartan  1 tablet Oral BID   spironolactone  12.5 mg Oral Daily   tamsulosin  0.4 mg Oral QPC breakfast   timolol  1 drop Both Eyes q AM   vitamin B-12  500 mcg Oral Daily    Objective: Vital signs in last 24 hours: Temp:  [97.6 F (36.4 C)-98.2 F (36.8 C)] 98.2 F (36.8 C) (12/14 1151) Pulse Rate:  [77-91] 86 (12/14 1151) Resp:  [16-18] 18 (12/14 1151) BP: (84-108)/(51-63) 93/62 (12/14 1151) SpO2:  [92 %-100 %] 96 % (12/14 1151)  PHYSICAL EXAM:  General: Alert, cooperative, no distress, appears stated age.  Head: Normocephalic, without obvious abnormality, atraumatic. Eyes: Conjunctivae clear, anicteric sclerae. Pupils are equal ENT Nares normal. No drainage or sinus tenderness. Lips, mucosa, and tongue normal. No Thrush Neck: Supple, symmetrical, no adenopathy, thyroid: non tender no carotid bruit and no JVD. Back: No CVA tenderness. Lungs: Bilateral air entry.  Crypts in the bases.[name] Heart: Systolic murmur.  Abdomen: Soft, non-tender,not distended. Bowel sounds normal. No masses Extremities: Bilateral stasis with stain. Right leg scabs Edema legs improved  Right knee surgical dressing not removed.  Left knee TKA scar Lymph: Cervical, supraclavicular normal. Neurologic: Grossly  non-focal  Lab Results Recent Labs    10/02/21 0211 10/03/21 0221  WBC 5.7 5.4  HGB 9.8* 9.0*  HCT 32.6* 29.3*  NA 133*  --   K 4.3  --   CL 101  --   CO2 25  --   BUN 28*  --   CREATININE 1.15  --    Liver Panel Recent Labs    10/03/21 0221  PROT 7.0  ALBUMIN 2.7*  AST 28  ALT 14  ALKPHOS 139*  BILITOT 1.4*  BILIDIR 0.6*  IBILI 0.8     Microbiology: Surgical culture pending. Studies/Results: [place] EKG SITE RITE  Result Date: 10/03/2021 If Site Rite image not attached, placement could not be confirmed due to current cardiac rhythm.    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 Polly exchange done As the knee hardware is retained he is going to need 6 weeks of IV antibiotics plus rifampin followed by oral antibiotics for 6 months or more.  Complicated cardiovascular history Recent high risk PCI with stenting of the LAD under Impella support and  Congestive heart failure with EF of 25 to 30% with peripheral edema legs.  Anemia  Bilateral lower extremity venous edema/venous stasis  History of CVA  History of PAD  History of left TKA  Patient is going to need IV cefazolin until 11/12/2021.  I will  follow him as outpatient. Discussed the management with the patient and his partner at bedside.  ID will sign off call if needed.

## 2021-10-03 NOTE — Progress Notes (Signed)
Peripherally Inserted Central Catheter Placement  The IV Nurse has discussed with the patient and/or persons authorized to consent for the patient, the purpose of this procedure and the potential benefits and risks involved with this procedure.  The benefits include less needle sticks, lab draws from the catheter, and the patient may be discharged home with the catheter. Risks include, but not limited to, infection, bleeding, blood clot (thrombus formation), and puncture of an artery; nerve damage and irregular heartbeat and possibility to perform a PICC exchange if needed/ordered by physician.  Alternatives to this procedure were also discussed.  Bard Power PICC patient education guide, fact sheet on infection prevention and patient information card has been provided to patient /or left at bedside.    PICC Placement Documentation  PICC Single Lumen 10/03/21 Brachial 40 cm 0 cm (Active)  Indication for Insertion or Continuance of Line Home intravenous therapies (PICC only) 10/03/21 1338  Exposed Catheter (cm) 0 cm 10/03/21 1338  Site Assessment Clean;Dry;Intact 10/03/21 1338  Line Status Flushed;Saline locked;Blood return noted 10/03/21 1338  Dressing Type Transparent 10/03/21 1338  Dressing Status Clean;Dry;Intact 10/03/21 1338  Antimicrobial disc in place? Yes 10/03/21 1338  Dressing Intervention New dressing;Other (Comment) 10/03/21 1338  Dressing Change Due 10/10/21 10/03/21 1338       Christella Noa Albarece 10/03/2021, 1:39 PM

## 2021-10-03 NOTE — Treatment Plan (Signed)
Diagnosis: MSSA PJI RT knee Baseline Creatinine 1.15  Culture Result: Staph aureus  Allergies  Allergen Reactions   Jardiance [Empagliflozin]     Rash all over and lips turn purple.    Metformin And Related Diarrhea   Trazodone Other (See Comments)    Extreme hallucinations/ psychosis     OPAT Orders Discharge antibiotics: Cefazolin 2 grams IV every 8 hours Rifampin 363m PO BID   End Date:11/12/21   PIC Care Per Protocol:including placement of biopatch  Labs weekly  on Monday while on IV antibiotics: _X_ CBC with differential __ BMP _X_ CMP _X_ CRP _X_ ESR   _X_ Please pull PIC at completion of IV antibiotics _  Fax weekly labs to ([phone] Clinic Follow Up Appt: 10/30/21 at 11.45Am   Call [phone] critical values or other questions

## 2021-10-03 NOTE — TOC Progression Note (Signed)
Transition of Care [facility]) - Progression Note    Patient Details  Name: Henry Lindsey MRN: [id number] Date of Birth: [birth year]  Transition of Care [facility]) CM/SW [place], RN Phone Number: 10/03/2021, 2:39 PM  Clinical Narrative:   Patient will need IV antibiotics at home.  P Chandler in room for teaching, will teach girlfriend tomorrow prior to discharge.  TOC to follow    Expected Discharge Plan:  (TBD)    Expected Discharge Plan and Services Expected Discharge Plan:  (TBD)   Discharge Planning Services: CM Consult   Living arrangements for the past 2 months: Single Family Home                                       Social Determinants of Health (SDOH) Interventions    Readmission Risk Interventions Readmission Risk Prevention Plan 08/17/2021  Transportation Screening Complete  PCP or Specialist Appt within 3-5 Days Complete  HRI or [place] Complete  Social Work Consult for [place] Planning/Counseling Complete  Palliative Care Screening Not Applicable  Medication Review Press photographer) Complete  Some recent data might be hidden

## 2021-10-03 NOTE — Progress Notes (Signed)
PHARMACY CONSULT NOTE FOR:  OUTPATIENT  PARENTERAL ANTIBIOTIC THERAPY (OPAT)  Indication: MSSA PJI of knee Regimen: Cefazolin 2gm IV q8h Also PO rifampin 300mg  PO BID End date: 11/12/2021  IV antibiotic discharge orders are pended. To discharging provider:  please sign these orders via discharge navigator,  Select New Orders & click on the button choice - Manage This Unsigned Work.     Thank you for allowing pharmacy to be a part of this patient's care.  Doreene Eland, PharmD, BCPS, BCIDP Work Cell: [phone] 10/03/2021 2:09 PM

## 2021-10-04 LAB — C-REACTIVE PROTEIN: CRP: 11.4 mg/dL — ABNORMAL HIGH (ref <1.0)

## 2021-10-04 LAB — SEDIMENTATION RATE: Sed Rate: 58 mm/hr — ABNORMAL HIGH (ref 0–20)

## 2021-10-04 LAB — GLUCOSE, CAPILLARY
Glucose-Capillary: 203 mg/dL — ABNORMAL HIGH (ref 70–99)
Glucose-Capillary: 81 mg/dL (ref 70–99)

## 2021-10-04 MED ORDER — CEFAZOLIN IV (FOR PTA / DISCHARGE USE ONLY): 2.0000 g | Freq: Three times a day (TID) | INTRAVENOUS | 0 refills | Status: AC

## 2021-10-04 MED ORDER — HYDROCODONE-ACETAMINOPHEN 5-325 MG PO TABS: 1.0000 | ORAL_TABLET | ORAL | 0 refills | Status: DC | PRN

## 2021-10-04 MED ORDER — RIFAMPIN 300 MG PO CAPS: 300.0000 mg | ORAL_CAPSULE | Freq: Two times a day (BID) | ORAL | 1 refills | Status: DC

## 2021-10-04 MED ORDER — DOCUSATE SODIUM 100 MG PO CAPS: 100.0000 mg | ORAL_CAPSULE | Freq: Two times a day (BID) | ORAL | 0 refills | Status: DC

## 2021-10-04 MED ORDER — RIFAMPIN 300 MG PO CAPS: 300.0000 mg | ORAL_CAPSULE | Freq: Two times a day (BID) | ORAL | 1 refills | Status: AC

## 2021-10-04 NOTE — Progress Notes (Signed)
°  Subjective:  Patient reports pain as mild to moderate.    Objective:   VITALS:   Vitals:   10/03/21 1551 10/03/21 1936 10/03/21 2317 10/04/21 0313  BP: (!) 99/54 107/65 96/60 (!) 103/56  Pulse: 85 99 92 98  Resp: 18 15 16 14   Temp: 97.9 F (36.6 C) 98.4 F (36.9 C) 98 F (36.7 C) 98.1 F (36.7 C)  TempSrc:      SpO2: 95% 100% 100% 100%  Weight:      Height:        PHYSICAL EXAM:  Neurologically intact ABD soft Neurovascular intact Sensation intact distally Intact pulses distally Dorsiflexion/Plantar flexion intact Incision: dressing C/D/I and drsg changed aquacel placed No cellulitis present Compartment soft  LABS  Results for orders placed or performed during the hospital encounter of 10/01/21 (from the past 24 hour(s))  Glucose, capillary     Status: Abnormal   Collection Time: 10/03/21  8:05 AM  Result Value Ref Range   Glucose-Capillary 124 (H) 70 - 99 mg/dL  Glucose, capillary     Status: Abnormal   Collection Time: 10/03/21 11:59 AM  Result Value Ref Range   Glucose-Capillary 152 (H) 70 - 99 mg/dL  Glucose, capillary     Status: Abnormal   Collection Time: 10/03/21  4:28 PM  Result Value Ref Range   Glucose-Capillary 129 (H) 70 - 99 mg/dL  Glucose, capillary     Status: Abnormal   Collection Time: 10/03/21  9:15 PM  Result Value Ref Range   Glucose-Capillary 212 (H) 70 - 99 mg/dL  Sedimentation rate     Status: Abnormal   Collection Time: 10/04/21  2:11 AM  Result Value Ref Range   Sed Rate 58 (H) 0 - 20 mm/hr  C-reactive protein     Status: Abnormal   Collection Time: 10/04/21  2:11 AM  Result Value Ref Range   CRP 11.4 (H) <1.0 mg/dL    [place] EKG SITE RITE  Result Date: 10/03/2021 If Site Rite image not attached, placement could not be confirmed due to current cardiac rhythm.   Assessment/Plan: 3 Days Post-Op   Principal Problem:   S/P revision of total knee, right   Advance diet Up with therapy PICC placed May D/C home with  HHPT Follow up in 10/12/21   Carlynn Spry , PA-C 10/04/2021, 7:11 AM

## 2021-10-04 NOTE — Progress Notes (Signed)
Pt complained of chest pain at 1905, described as "heartburn." Vitals WDL. No acute distress noted. [facility], Dr. Sharlet Salina, notified. No new orders at this time.  At 1930 pt no longer complaining of chest pain.

## 2021-10-04 NOTE — Discharge Instructions (Signed)
Continue weight bear as tolerated on the right lower extremity.    Elevate the right lower extremity whenever possible and continue the polar care while elevating the extremity. Patient may shower. No bath or submerging the wound.    Take plavix and aspirin as directed for blood clot prevention.  Continue to work on knee range of motion exercises at home as instructed by physical therapy. Continue to use a walker for assistance with ambulation until cleared by physical therapy.  Call [phone] with any questions, such as fever > 101.5 degrees, drainage from the wound or shortness of breath. ri

## 2021-10-04 NOTE — Discharge Summary (Signed)
Physician Discharge Summary  Patient ID: Henry Lindsey MRN: [id number] DOB/AGE: 85/04/1933 85 y.o.  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 D1 sev dz. LCX mild prox dzs, OM2 mod-sev dz, RCA 100p. RPL and RPDA fill via L-R collats. EF 35%. Seen by CVTS->Med mgmt. b.) R/LHC 08/14/21: 30% m-d-LM, 85% oLAD, 50% pLAD, 35% dLAD, 80% D1, 50% pLCx, 70% OM1, 100% p-dRCA -> trans to [facility]. c.) 08/15/21 Impella supported HIGH RISK PCI with atherectomy. 2.75 x 85m Onyx Frontier DES oLAD.   Carotid artery stenosis    a. 11/2010 U/S: <50% bilaterally; b. 03/2021 40-59% bilat ICA stenoses.   COPD (chronic obstructive pulmonary disease) ([facility]    noted CT 10/05/16 former smoker quit age 87   CVA (cerebral infarction) 11/2010   a.) RIGHT thalamic lacunar   GERD (gastroesophageal reflux disease)    Heart murmur    HFrEF (heart failure with reduced ejection fraction) ([facility]    a. 10/2020 Echo: EF 35%.   Hyperlipidemia    Hypertension    Ischemic cardiomyopathy    a. 10/2020 Echo: EF 35%. Nl RVSP. Mod MR. Mild TR; b. 11/2020 Cath:  CO/CI 5.05/2.49. LV gram: EF 35%.   Lower extremity cellulitis    NSTEMI (non-ST elevated myocardial infarction) ([facility] 08/09/2021   a.) R/LHC 08/14/2021: 30% m-d-LM, 85% oLAD, 50% pLAD, 35% dLAD, 80% D1, 50% pLCx, 70% OM1, 100% p-dRCA ->  transfer to [facility]. b.) 08/15/2021 Impella supported HIGH RISK PCI with atherectomy. 2.75 x 270mOnyx Frontier DES to [employer] [employer]  Peripheral vascular disease in diabetes mellitus ([facility]   a. 06/2012 95% occlusion s/p PTA R SFA (Dr. DeLucky Cowboy b. 03/2021 ABIs: R 1.02, L 0.96.   Pneumonia    Type 2 diabetes mellitus treated with insulin ([facility]    Surgeries: Procedure(s): IRRIGATION AND DEBRIDMENT RIGHT KNEE WITH POLY EXCHANGE on 10/01/2021   Consultants (if any):   Discharged Condition: Improved  Hospital Course: Henry Lindsey an 89.o. 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    He was given perioperative antibiotics:  Anti-infectives (From admission, onward)    Start     Dose/Rate Route Frequency Ordered Stop   10/04/21 0000  ceFAZolin (ANCEF) IVPB        2 g Intravenous Every 8 hours 10/04/21 0721 11/12/21 2359   10/02/21 2200  rifampin (RIFADIN) capsule 300 mg        300 mg Oral Every 12 hours 10/02/21 1641 11/12/21 2359   10/02/21 0600  ceFAZolin (ANCEF) IVPB 2g/100 mL premix        2  g 200 mL/hr over 30 Minutes Intravenous On call to O.R. 10/01/21 1518 10/01/21 1834   10/01/21 2300  ceFAZolin (ANCEF) IVPB 2g/100 mL premix        2 g 200 mL/hr over 30 Minutes Intravenous Every 8 hours 10/01/21 2043 10/15/21 2159   10/01/21 1530  ceFAZolin (ANCEF) 2-4 GM/100ML-% IVPB       Note to Pharmacy: [name] W: cabinet override      10/01/21 1530 10/01/21 1824     .  He was given sequential compression devices, early ambulation, and plavix and aspirin for DVT prophylaxis.  He benefited maximally from the hospital stay and there were no complications.    Recent vital signs:  Vitals:   10/03/21 2317 10/04/21 0313  BP: 96/60  (!) 103/56  Pulse: 92 98  Resp: 16 14  Temp: 98 F (36.7 C) 98.1 F (36.7 C)  SpO2: 100% 100%    Recent laboratory studies:  Lab Results  Component Value Date   HGB 9.0 (L) 10/03/2021   HGB 9.8 (L) 10/02/2021   HGB 10.6 (L) 09/27/2021   Lab Results  Component Value Date   WBC 5.4 10/03/2021   PLT 216 10/03/2021   Lab Results  Component Value Date   INR 1.3 (H) 09/27/2021   Lab Results  Component Value Date   NA 133 (L) 10/02/2021   K 4.3 10/02/2021   CL 101 10/02/2021   CO2 25 10/02/2021   BUN 28 (H) 10/02/2021   CREATININE 1.15 10/02/2021   GLUCOSE 145 (H) 10/02/2021    Discharge Medications:   Allergies as of 10/04/2021       Reactions   Jardiance [empagliflozin]    Rash all over and lips turn purple.    Metformin And Related Diarrhea   Trazodone Other (See Comments)   Extreme hallucinations/ psychosis         Medication List     TAKE these medications    albuterol 108 (90 Base) MCG/ACT inhaler Commonly known as: VENTOLIN HFA Inhale 2 puffs into the lungs every 6 (six) hours as needed for shortness of breath or wheezing.   ascorbic acid 500 MG tablet Commonly known as: VITAMIN C Take 500 mg by mouth daily.   aspirin EC 81 MG tablet Take 1 tablet (81 mg total) by mouth daily. Swallow whole.   atorvastatin 40 MG tablet Commonly known as: LIPITOR Take 1 tablet (40 mg total) by mouth daily. What changed: when to take this   ceFAZolin  IVPB Commonly known as: ANCEF Inject 2 g into the vein every 8 (eight) hours. Indication:  MSSA prosthetic joint infection of knee First Dose: Yes Last Day of Therapy:  11/12/2021 Labs - Once weekly on MONDAYS:  CBC/D, CMP, ESR and CRP Please pull PIC at completion of IV antibiotics Fax weekly labs to [phone] Method of administration: IV Push Method of administration may be changed at the discretion of home infusion pharmacist based upon assessment of the patient and/or caregiver's ability to  self-administer the medication ordered.   cephALEXin 500 MG capsule Commonly known as: KEFLEX Take 500 mg by mouth 4 (four) times daily.   cholecalciferol 25 MCG (1000 UNIT) tablet Commonly known as: VITAMIN D Take 1,000 Units by mouth daily.   clopidogrel 75 MG tablet Commonly known as: PLAVIX TAKE 1 TABLET(75 MG) BY MOUTH DAILY WITH BREAKFAST   cyclobenzaprine 5 MG tablet Commonly known as: FLEXERIL Take 5-10 mg by mouth daily as needed for muscle spasms.  diclofenac Sodium 1 % Gel Commonly known as: VOLTAREN Apply 2 g topically 2 (two) times daily as needed (pain).   docusate sodium 100 MG capsule Commonly known as: COLACE Take 1 capsule (100 mg total) by mouth 2 (two) times daily.   Entresto 24-26 MG Generic drug: sacubitril-valsartan Take 1 tablet by mouth 2 (two) times daily.   furosemide 20 MG tablet Commonly known as: LASIX Take 1 tablet (20 mg total) by mouth daily. What changed:  how much to take when to take this additional instructions   gabapentin 400 MG capsule Commonly known as: NEURONTIN Take 400 mg by mouth 2 (two) times daily.   HYDROcodone-acetaminophen 5-325 MG tablet Commonly known as: NORCO/VICODIN Take 1 tablet by mouth every 6 (six) hours as needed (pain.). What changed: Another medication with the same name was added. Make sure you understand how and when to take each.   HYDROcodone-acetaminophen 5-325 MG tablet Commonly known as: NORCO/VICODIN Take 1 tablet by mouth every 4 (four) hours as needed for moderate pain (pain score 4-6). What changed: You were already taking a medication with the same name, and this prescription was added. Make sure you understand how and when to take each.   INS SYRINGE/NEEDLE 1CC/28G 28G X 1/2" 1 ML Misc Commonly known as: B-D INSULIN SYRINGE 1CC/28G USE TO ADMINISTER INSULIN DAILY   insulin aspart protamine- aspart (70-30) 100 UNIT/ML injection Commonly known as: NOVOLOG MIX 70/30 Inject 0.15 mLs (15  Units total) into the skin 2 (two) times daily with a meal.   latanoprost 0.005 % ophthalmic solution Commonly known as: XALATAN Place 1 drop into both eyes at bedtime.   metoprolol succinate 50 MG 24 hr tablet Commonly known as: TOPROL-XL Take 1 tablet (50 mg total) by mouth daily. Take with or immediately following a meal.   multivitamin with minerals Tabs tablet Take 1 tablet by mouth in the morning.   nitroGLYCERIN 0.4 MG SL tablet Commonly known as: NITROSTAT Place 1 tablet (0.4 mg total) under the tongue every 5 (five) minutes as needed for chest pain. MAXIMUM 3 TABLETS   ONE TOUCH ULTRA SYSTEM KIT w/Device Kit 1 kit by Does not apply route once. Use DX code E11.59   ONE TOUCH ULTRA TEST test strip Generic drug: glucose blood USE 1 STRIP TO TEST THREE TIMES DAILY AS DIRECTED   OneTouch Delica Lancets 54Y Misc Use three times daily to check blood sugar.   pantoprazole 40 MG tablet Commonly known as: PROTONIX Take 40 mg by mouth daily.   spironolactone 25 MG tablet Commonly known as: ALDACTONE Take 0.5 tablets (12.5 mg total) by mouth daily.   sulfamethoxazole-trimethoprim 800-160 MG tablet Commonly known as: BACTRIM DS Take 1 tablet by mouth 2 (two) times daily.   tamsulosin 0.4 MG Caps capsule Commonly known as: FLOMAX Take 0.4 mg by mouth daily after breakfast.   timolol 0.5 % ophthalmic solution Commonly known as: TIMOPTIC Place 1 drop into both eyes in the morning.   vitamin B-12 500 MCG tablet Commonly known as: CYANOCOBALAMIN Take 500 mcg by mouth daily.   vitamin E 180 MG (400 UNITS) capsule Take 400 Units by mouth daily.               Durable Medical Equipment  (From admission, onward)           Start     Ordered   10/04/21 0726  For home use only DME Walker rolling  Once       Question  Answer Comment  Walker: With 5 Inch Wheels   Patient needs a walker to treat with the following condition S/P revision of total knee, right       10/04/21 0726              Discharge Care Instructions  (From admission, onward)           Start     Ordered   10/04/21 0000  Change dressing on IV access line weekly and PRN  (Home infusion instructions - [facility] )        10/04/21 0721            Diagnostic Studies: [place] EKG SITE RITE  Result Date: 10/03/2021 If Site Rite image not attached, placement could not be confirmed due to current cardiac rhythm.   Disposition: Discharge disposition: 01-Home or Self Care       Discharge Instructions     [facility] pharmacist to adjust dose for Vancomycin, Aminoglycosides and other anti-infective therapies as requested by physician.   Complete by: As directed    [facility] to provide Cath Flo 52m   Complete by: As directed    Administer for PICC line occlusion and as ordered by physician for other access device issues.   Anaphylaxis Kit: Provided to treat any anaphylactic reaction to the medication being provided to the patient if First Dose or when requested by physician   Complete by: As directed    Epinephrine 159mml vial / amp: Administer 0.53m35m0.53ml50mubcutaneously once for moderate to severe anaphylaxis, nurse to call physician and pharmacy when reaction occurs and call 911 if needed for immediate care   Diphenhydramine 50mg64mIV vial: Administer 25-50mg 68mM PRN for first dose reaction, rash, itching, mild reaction, nurse to call physician and pharmacy when reaction occurs   Sodium Chloride 0.9% NS 500ml I71mdminister if needed for hypovolemic blood pressure drop or as ordered by physician after call to physician with anaphylactic reaction   Change dressing on IV access line weekly and PRN   Complete by: As directed    Face-to-face encounter (required for Medicare/Medicaid patients)   Complete by: As directed    I MauriceCarlynn Spryy that this patient is under my care and that I, or a nurse practitioner or physician's  assistant working with me, had a face-to-face encounter that meets the physician face-to-face encounter requirements with this patient on 10/04/2021. The encounter with the patient was in whole, or in part for the following medical condition(s) which is the primary reason for home health care (List medical condition): right knee revision   The encounter with the patient was in whole, or in part, for the following medical condition, which is the primary reason for home health care: right knee revision   I certify that, based on my findings, the following services are medically necessary home health services: Physical therapy   Reason for Medically Necessary Home Health Services: Therapy- Gait TrPersonnel officerfePublic librarianlinical findings support the need for the above services: Unable to leave home safely without assistance and/or assistive device   Further, I certify that my clinical findings support that this patient is homebound due to: Ambulates short distances less than 300 feet   Flush IV access with Sodium Chloride 0.9% and Heparin 10 units/ml or 100 units/ml   Complete by: As directed    Home Health   Complete by: As directed    To provide the  following care/treatments: PT   Home infusion instructions - [facility]   Complete by: As directed    Instructions: Flush IV access with Sodium Chloride 0.9% and Heparin 10units/ml or 100units/ml   Change dressing on IV access line: Weekly and PRN   Instructions Cath Flo 35m: Administer for PICC Line occlusion and as ordered by physician for other access device   [facility] pharmacist to adjust dose for: Vancomycin, Aminoglycosides and other anti-infective therapies as requested by physician   Method of administration may be changed at the discretion of home infusion pharmacist based upon assessment of the patient and/or caregivers ability to self-administer the medication ordered   Complete by: As  directed       Follow up in office 10/12/21. Call office to confirm [phone]    Signed: MCarlynn Spry,PA-C 10/04/2021, 7:28 AM

## 2021-10-04 NOTE — Progress Notes (Signed)
Blood pressure 96/63, pulse 86, temperature 98.3 F (36.8 C), resp. rate 19, height 5\' 11"  (1.803 m), weight 79.4 kg, SpO2 100 %. IV cath removed picc line in place in the rt arm, home health infusion set up through [facility], [place] did the teaching yesterday. Explained the d.c packet with pt and girlfriend at bedside. Scripts placed in packet and electronically sent to the pharmacy. Pt went home with all belongings and polar care and transported by tech via w/c down to private car.

## 2021-10-04 NOTE — Care Management Important Message (Signed)
Important Message  Patient Details  Name: Henry Lindsey MRN: [id number] Date of Birth: 1933/02/06   Medicare Important Message Given:  N/A - LOS <3 / Initial given by admissions     Juliann Pulse A Genelda Roark 10/04/2021, 8:57 AM

## 2021-10-04 NOTE — TOC Progression Note (Signed)
Transition of Care [facility]) - Progression Note    Patient Details  Name: Henry Lindsey MRN: [id number] Date of Birth: 1933/04/12  [facility] CM/SW [place], RN Phone Number: 10/04/2021, 3:00 PM  Clinical Narrative:   Patient will discharge today.  [facility] to follow patient's PICC line.  [place] provided training for IV antibiotics.  No equipment recommendations from PT.      Expected Discharge Plan:  (TBD)    Expected Discharge Plan and Services Expected Discharge Plan:  (TBD)   Discharge Planning Services: CM Consult   Living arrangements for the past 2 months: Single Family Home Expected Discharge Date: 10/04/21                                     Social Determinants of Health (SDOH) Interventions    Readmission Risk Interventions Readmission Risk Prevention Plan 08/17/2021  Transportation Screening Complete  PCP or Specialist Appt within 3-5 Days Complete  HRI or [place] Complete  Social Work Consult for [place] Planning/Counseling Complete  Palliative Care Screening Not Applicable  Medication Review Press photographer) Complete  Some recent data might be hidden

## 2021-10-04 NOTE — Progress Notes (Signed)
Physical Therapy Treatment Patient Details Name: Henry Lindsey MRN: [id number] DOB: 1932-11-20 Today's Date: 10/04/2021   History of Present Illness Pt admitted for R TKR revision secondary to infection with I&D. 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 PMHx: CHF, EF 35%, HTN, HLD, DM, COPD, stroke, GERD, PVD, R PTA SFA, CAD, carotid artery stenosis, CKD 3, diverticulitis.    PT Comments    Pt was long sitting in bed upon arriving. He is A and O x 4 and eager to DC home this date. He was easily able to exit bed, stand, and ambulate without physical assistance or safety concerns. Pt is cleared from an acute PT standpoint to DC home with supportive girlfriend and daughter assistance.    Recommendations for follow up therapy are one component of a multi-disciplinary discharge planning process, led by the attending physician.  Recommendations may be updated based on patient status, additional functional criteria and insurance authorization.  Follow Up Recommendations  Home health PT     Assistance Recommended at Discharge Set up Supervision/Assistance  Equipment Recommendations  None recommended by PT       Precautions / Restrictions Precautions Precautions: Fall;Knee Precaution Booklet Issued: Yes (comment) Restrictions Weight Bearing Restrictions: Yes RLE Weight Bearing: Weight bearing as tolerated     Mobility  Bed Mobility Overal bed mobility: Needs Assistance Bed Mobility: Supine to Sit;Sit to Supine     Supine to sit: Supervision Sit to supine: Supervision        Transfers Overall transfer level: Needs assistance Equipment used: Rolling walker (2 wheels) Transfers: Sit to/from Stand Sit to Stand: Supervision     Ambulation/Gait Ambulation/Gait assistance: Supervision Gait Distance (Feet): 200 Feet Assistive device: Rolling walker (2 wheels) Gait Pattern/deviations: Step-through pattern Gait velocity: decreased     General Gait Details: Pt was able to ambulate 200 ft with  RW. no LOB or saftey concerns   Stairs Stairs:  (pt does not have stairs. Author encouraged performing stair training in case he came across in public however pt was unwilling.)             Cognition Arousal/Alertness: Awake/alert Behavior During Therapy: WFL for tasks assessed/performed Overall Cognitive Status: Within Functional Limits for tasks assessed      General Comments: Pt is A and O x 4               Pertinent Vitals/Pain Pain Assessment: 0-10 Pain Score: 3  Pain Location: R knee Pain Descriptors / Indicators: Operative site guarding;Aching Pain Intervention(s): Limited activity within patient's tolerance;Monitored during session;Premedicated before session;Repositioned     PT Goals (current goals can now be found in the care plan section) Acute Rehab PT Goals Patient Stated Goal: to go home Progress towards PT goals: Progressing toward goals    Frequency    BID      PT Plan Current plan remains appropriate       AM-PAC PT "6 Clicks" Mobility   Outcome Measure  Help needed turning from your back to your side while in a flat bed without using bedrails?: A Little Help needed moving from lying on your back to sitting on the side of a flat bed without using bedrails?: A Little Help needed moving to and from a bed to a chair (including a wheelchair)?: A Little Help needed standing up from a chair using your arms (e.g., wheelchair or bedside chair)?: A Little Help needed to walk in hospital room?: A Little Help needed climbing 3-5 steps with a railing? : A Little  6 Click Score: 18    End of Session   Activity Tolerance: Patient tolerated treatment well Patient left: in bed;with call bell/phone within reach;with bed alarm set;with nursing/sitter in room Nurse Communication: Mobility status PT Visit Diagnosis: Muscle weakness (generalized) (M62.81);Difficulty in walking, not elsewhere classified (R26.2);Pain Pain - Right/Left: Right Pain - part of  body: Knee     Time: 5423-7023 PT Time Calculation (min) (ACUTE ONLY): 17 min  Charges:  $Gait Training: 8-22 mins                    Julaine Fusi PTA 10/04/21, 10:09 AM

## 2021-10-04 NOTE — Discharge Summary (Signed)
Physician Discharge Summary  Patient ID: Henry Lindsey MRN: [id number] DOB/AGE: [birth year] 87 y.o.  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 D1 sev dz. LCX mild prox dzs, OM2 mod-sev dz, RCA 100p. RPL and RPDA fill via L-R collats. EF 35%. Seen by CVTS->Med mgmt. b.) R/LHC 08/14/21: 30% m-d-LM, 85% oLAD, 50% pLAD, 35% dLAD, 80% D1, 50% pLCx, 70% OM1, 100% p-dRCA -> trans to [facility]. c.) 08/15/21 Impella supported HIGH RISK PCI with atherectomy. 2.75 x 42m Onyx Frontier DES oLAD.   Carotid artery stenosis    a. 11/2010 U/S: <50% bilaterally; b. 03/2021 40-59% bilat ICA stenoses.   COPD (chronic obstructive pulmonary disease) ([place]    noted CT 10/05/16 former smoker quit age 89   CVA (cerebral infarction) 11/2010   a.) RIGHT thalamic lacunar   GERD (gastroesophageal reflux disease)    Heart murmur    HFrEF (heart failure with reduced ejection fraction) ([facility]    a. 10/2020 Echo: EF 35%.   Hyperlipidemia    Hypertension    Ischemic cardiomyopathy    a. 10/2020 Echo: EF 35%. Nl RVSP. Mod MR. Mild TR; b. 11/2020 Cath:  CO/CI 5.05/2.49. LV gram: EF 35%.   Lower extremity cellulitis    NSTEMI (non-ST elevated myocardial infarction) ([place] 08/09/2021   a.) R/LHC 08/14/2021: 30% m-d-LM, 85% oLAD, 50% pLAD, 35% dLAD, 80% D1, 50% pLCx, 70% OM1, 100% p-dRCA ->  transfer to [facility]. b.) 08/15/2021 Impella supported HIGH RISK PCI with atherectomy. 2.75 x 232mOnyx Frontier DES to [facility]  Peripheral vascular disease in diabetes mellitus ([place]   a. 06/2012 95% occlusion s/p PTA R SFA (Dr. DeLucky Cowboy b. 03/2021 ABIs: R 1.02, L 0.96.   Pneumonia    Type 2 diabetes mellitus treated with insulin ([place]    Surgeries: Procedure(s): IRRIGATION AND DEBRIDMENT RIGHT KNEE WITH POLY EXCHANGE on 10/01/2021   Consultants (if any):   Discharged Condition: Improved  Hospital Course: WaDONTAE MINERVAs an [age].o. 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    He was given perioperative antibiotics:  Anti-infectives (From admission, onward)    Start     Dose/Rate Route Frequency Ordered Stop   10/04/21 0000  ceFAZolin (ANCEF) IVPB        2 g Intravenous Every 8 hours 10/04/21 0721 11/12/21 2359   10/04/21 0000  rifampin (RIFADIN) 300 MG capsule  Status:  Discontinued        300 mg Oral 2 times daily 10/04/21 1125 10/04/21    10/04/21 0000  rifampin (RIFADIN) 300 MG capsule  300 mg Oral 2 times daily 10/04/21 1126 11/12/21 2359   10/02/21 2200  rifampin (RIFADIN) capsule 300 mg        300 mg Oral Every 12 hours 10/02/21 1641 11/12/21 2359   10/02/21 0600  ceFAZolin (ANCEF) IVPB 2g/100 mL premix        2 g 200 mL/hr over 30 Minutes Intravenous On call to O.R. 10/01/21 1518 10/01/21 1834   10/01/21 2300  ceFAZolin (ANCEF) IVPB 2g/100 mL premix        2 g 200 mL/hr over 30 Minutes Intravenous Every 8 hours 10/01/21 2043 10/15/21 2159   10/01/21 1530  ceFAZolin (ANCEF) 2-4 GM/100ML-% IVPB       Note to Pharmacy: [name] W: cabinet override      10/01/21 1530 10/01/21 1824     .  He was given  sequential compression devices, early ambulation, and aspirin and plavix for DVT prophylaxis.  He benefited maximally from the hospital stay and there were no complications.    Recent vital signs:  Vitals:   10/04/21 0313 10/04/21 0757  BP: (!) 103/56 132/72  Pulse: 98 98  Resp: 14 17  Temp: 98.1 F (36.7 C) 97.9 F (36.6 C)  SpO2: 100% 100%    Recent laboratory studies:  Lab Results  Component Value Date   HGB 9.0 (L) 10/03/2021   HGB 9.8 (L) 10/02/2021   HGB 10.6 (L) 09/27/2021   Lab Results  Component Value Date   WBC 5.4 10/03/2021   PLT 216 10/03/2021   Lab Results  Component Value Date   INR 1.3 (H) 09/27/2021   Lab Results  Component Value Date   NA 133 (L) 10/02/2021   K 4.3 10/02/2021   CL 101 10/02/2021   CO2 25 10/02/2021   BUN 28 (H) 10/02/2021   CREATININE 1.15 10/02/2021   GLUCOSE 145 (H) 10/02/2021    Discharge Medications:   Allergies as of 10/04/2021       Reactions   Jardiance [empagliflozin]    Rash all over and lips turn purple.    Metformin And Related Diarrhea   Trazodone Other (See Comments)   Extreme hallucinations/ psychosis         Medication List     STOP taking these medications    cephALEXin 500 MG capsule Commonly known as: KEFLEX   sulfamethoxazole-trimethoprim 800-160 MG tablet Commonly known as: BACTRIM DS       TAKE these medications    albuterol 108 (90 Base) MCG/ACT inhaler Commonly known as: VENTOLIN HFA Inhale 2 puffs into the lungs every 6 (six) hours as needed for shortness of breath or wheezing.   ascorbic acid 500 MG tablet Commonly known as: VITAMIN C Take 500 mg by mouth daily.   aspirin EC 81 MG tablet Take 1 tablet (81 mg total) by mouth daily. Swallow whole.   atorvastatin 40 MG tablet Commonly known as: LIPITOR Take 1 tablet (40 mg total) by mouth daily. What changed: when to take this   ceFAZolin  IVPB Commonly known as: ANCEF Inject 2 g into the vein every 8 (eight) hours.  Indication:  MSSA prosthetic joint infection of knee First Dose: Yes Last Day of Therapy:  11/12/2021 Labs - Once weekly on MONDAYS:  CBC/D, CMP, ESR and CRP Please pull PIC at completion of IV antibiotics Fax weekly labs to [phone] Method of administration: IV Push Method of administration may be changed at the discretion of home infusion pharmacist based upon assessment of the  patient and/or caregiver's ability to self-administer the medication ordered.   cholecalciferol 25 MCG (1000 UNIT) tablet Commonly known as: VITAMIN D Take 1,000 Units by mouth daily.   clopidogrel 75 MG tablet Commonly known as: PLAVIX TAKE 1 TABLET(75 MG) BY MOUTH DAILY WITH BREAKFAST   cyclobenzaprine 5 MG tablet Commonly known as: FLEXERIL Take 5-10 mg by mouth daily as needed for muscle spasms.   diclofenac Sodium 1 % Gel Commonly known as: VOLTAREN Apply 2 g topically 2 (two) times daily as needed (pain).   docusate sodium 100 MG capsule Commonly known as: COLACE Take 1 capsule (100 mg total) by mouth 2 (two) times daily.   Entresto 24-26 MG Generic drug: sacubitril-valsartan Take 1 tablet by mouth 2 (two) times daily.   furosemide 20 MG tablet Commonly known as: LASIX Take 1 tablet (20 mg total) by mouth daily. What changed:  how much to take when to take this additional instructions   gabapentin 400 MG capsule Commonly known as: NEURONTIN Take 400 mg by mouth 2 (two) times daily.   HYDROcodone-acetaminophen 5-325 MG tablet Commonly known as: NORCO/VICODIN Take 1 tablet by mouth every 6 (six) hours as needed (pain.). What changed: Another medication with the same name was added. Make sure you understand how and when to take each.   HYDROcodone-acetaminophen 5-325 MG tablet Commonly known as: NORCO/VICODIN Take 1 tablet by mouth every 4 (four) hours as needed for moderate pain (pain score 4-6). What changed: You were already taking a medication with the same name, and this  prescription was added. Make sure you understand how and when to take each.   INS SYRINGE/NEEDLE 1CC/28G 28G X 1/2" 1 ML Misc Commonly known as: B-D INSULIN SYRINGE 1CC/28G USE TO ADMINISTER INSULIN DAILY   insulin aspart protamine- aspart (70-30) 100 UNIT/ML injection Commonly known as: NOVOLOG MIX 70/30 Inject 0.15 mLs (15 Units total) into the skin 2 (two) times daily with a meal.   latanoprost 0.005 % ophthalmic solution Commonly known as: XALATAN Place 1 drop into both eyes at bedtime.   metoprolol succinate 50 MG 24 hr tablet Commonly known as: TOPROL-XL Take 1 tablet (50 mg total) by mouth daily. Take with or immediately following a meal.   multivitamin with minerals Tabs tablet Take 1 tablet by mouth in the morning.   nitroGLYCERIN 0.4 MG SL tablet Commonly known as: NITROSTAT Place 1 tablet (0.4 mg total) under the tongue every 5 (five) minutes as needed for chest pain. MAXIMUM 3 TABLETS   ONE TOUCH ULTRA SYSTEM KIT w/Device Kit 1 kit by Does not apply route once. Use DX code E11.59   ONE TOUCH ULTRA TEST test strip Generic drug: glucose blood USE 1 STRIP TO TEST THREE TIMES DAILY AS DIRECTED   OneTouch Delica Lancets 36I Misc Use three times daily to check blood sugar.   pantoprazole 40 MG tablet Commonly known as: PROTONIX Take 40 mg by mouth daily.   rifampin 300 MG capsule Commonly known as: RIFADIN Take 1 capsule (300 mg total) by mouth 2 (two) times daily.   spironolactone 25 MG tablet Commonly known as: ALDACTONE Take 0.5 tablets (12.5 mg total) by mouth daily.   tamsulosin 0.4 MG Caps capsule Commonly known as: FLOMAX Take 0.4 mg by mouth daily after breakfast.   timolol 0.5 % ophthalmic solution Commonly known as: TIMOPTIC Place 1 drop into both eyes in the morning.   vitamin B-12 500 MCG tablet Commonly known as: CYANOCOBALAMIN Take 500 mcg by mouth daily.  vitamin E 180 MG (400 UNITS) capsule Take 400 Units by mouth daily.                Durable Medical Equipment  (From admission, onward)           Start     Ordered   10/04/21 0726  For home use only DME Walker rolling  Once       Question Answer Comment  Walker: With 5 Inch Wheels   Patient needs a walker to treat with the following condition S/P revision of total knee, right      10/04/21 0726              Discharge Care Instructions  (From admission, onward)           Start     Ordered   10/04/21 0000  Change dressing on IV access line weekly and PRN  (Home infusion instructions - [facility] )        10/04/21 0721            Diagnostic Studies: [place] EKG SITE RITE  Result Date: 10/03/2021 If Site Rite image not attached, placement could not be confirmed due to current cardiac rhythm.   Disposition: Discharge disposition: 01-Home or Self Care       Discharge Instructions     [facility] pharmacist to adjust dose for Vancomycin, Aminoglycosides and other anti-infective therapies as requested by physician.   Complete by: As directed    [facility] to provide Cath Flo 60m   Complete by: As directed    Administer for PICC line occlusion and as ordered by physician for other access device issues.   Anaphylaxis Kit: Provided to treat any anaphylactic reaction to the medication being provided to the patient if First Dose or when requested by physician   Complete by: As directed    Epinephrine 167mml vial / amp: Administer 0.33m76m0.33ml733mubcutaneously once for moderate to severe anaphylaxis, nurse to call physician and pharmacy when reaction occurs and call 911 if needed for immediate care   Diphenhydramine 50mg36mIV vial: Administer 25-50mg 41mM PRN for first dose reaction, rash, itching, mild reaction, nurse to call physician and pharmacy when reaction occurs   Sodium Chloride 0.9% NS 500ml I533mdminister if needed for hypovolemic blood pressure drop or as ordered by physician after call to  physician with anaphylactic reaction   Change dressing on IV access line weekly and PRN   Complete by: As directed    Face-to-face encounter (required for Medicare/Medicaid patients)   Complete by: As directed    I MauriceCarlynn Spryy that this patient is under my care and that I, or a nurse practitioner or physician's assistant working with me, had a face-to-face encounter that meets the physician face-to-face encounter requirements with this patient on 10/04/2021. The encounter with the patient was in whole, or in part for the following medical condition(s) which is the primary reason for home health care (List medical condition): right knee revision   The encounter with the patient was in whole, or in part, for the following medical condition, which is the primary reason for home health care: right knee revision   I certify that, based on my findings, the following services are medically necessary home health services: Physical therapy   Reason for Medically Necessary Home Health Services: Therapy- Gait TrPersonnel officerfeTraining and development officerair Training   My clinical findings support the need for the above services: Unable to  leave home safely without assistance and/or assistive device   Further, I certify that my clinical findings support that this patient is homebound due to: Ambulates short distances less than 300 feet   Flush IV access with Sodium Chloride 0.9% and Heparin 10 units/ml or 100 units/ml   Complete by: As directed    Home Health   Complete by: As directed    To provide the following care/treatments: PT   Home infusion instructions - [facility]   Complete by: As directed    Instructions: Flush IV access with Sodium Chloride 0.9% and Heparin 10units/ml or 100units/ml   Change dressing on IV access line: Weekly and PRN   Instructions Cath Flo 66m: Administer for PICC Line occlusion and as ordered by physician for other access device   [facility] pharmacist  to adjust dose for: Vancomycin, Aminoglycosides and other anti-infective therapies as requested by physician   Method of administration may be changed at the discretion of home infusion pharmacist based upon assessment of the patient and/or caregivers ability to self-administer the medication ordered   Complete by: As directed          Follow up in office 10/12/21. Call office to confirm [phone]   Signed: MCarlynn Spry,PA-C 10/04/2021, 11:26 AM

## 2021-10-05 ENCOUNTER — Telehealth: Payer: Self-pay | Admitting: Cardiovascular Disease

## 2021-10-05 ENCOUNTER — Telehealth: Payer: Self-pay

## 2021-10-05 LAB — AEROBIC/ANAEROBIC CULTURE W GRAM STAIN (SURGICAL/DEEP WOUND): Gram Stain: NONE SEEN

## 2021-10-05 NOTE — Telephone Encounter (Signed)
Patient's daughter states patient was in the hospital for knee surgery, and the nurses there advised him not to take [facility]. Please call to discuss.

## 2021-10-05 NOTE — Telephone Encounter (Signed)
Spoke with pt's daughter [name] (DPR approved), she reports nurses with ortho during Henry Lindsey's knee surgery did not administer Entresto while in the hospital d/t "low BP" (no numbers to report or symtpoms). Henry Lindsey stated the nurse "told me he does not need to be taking the med since it was lowering his BP". Henry Lindsey has not taken this medication since knee surgery 12/12, daughter has removed it from his daily medications.   Have asked if Henry Lindsey could take his BP/HR daily, before and after medication so we can have a record of his blood pressures to determine if medication needs to be adjustment and if Entresto needs to be stopped or adjusted. Henry Lindsey stated "we are just not going to give it to him due to BP concerns as he is already on 11 high-power BP medications".   Attempted to educate pt's daughter on benefits of Delene Loll how it is used to treat adults with long-lasting (chronic) heart failure to help reduce the risk of death and hospitalization. Entresto works better when the heart cannot pump a normal amount of blood to the body. Delene Loll is the #1 brand medication prescribe to patients with heart failure. It is a great and very beneficial medication. It is a two medication combinnation that works together to reduce blood vessel tightening and the buildup of sodium and fluid and it also helps relax blood vessels and decrease sodium and fluid in the body.  Advised will send to Henry Kathlen Mody, PA-C (who started [facility] 12/06, at last OV), Henry Lindsey and Henry Lindsey as Henry Lindsey, he has an appt with heart failure NP, Henry Lindsey, she can help follow-up with Entresto. Until then if she still wishes to withhold medication, would still request daily BP log to help determine medication adjustments if pt's BP is "low".   At this time, pt is not taking Entresto, daughter and pt not recording BP logs. Appt with Henry Lindsey 1/4 and Henry Lindsey 1/30.   Henry Lindsey thankful for the return call, requested FYI be sent to  Henry Lindsey so he is aware, Mr. Findling will not be taking Entresto. Will call back with anything further concerns.

## 2021-10-05 NOTE — Telephone Encounter (Addendum)
Carlota Raspberry, home health nurse with [facility], left VM requesting clarification of lab orders. She needs to know frequency and assumes it is twice a week since patient is on Vancomycin.   RN called Amy back, no answer. Left VM stating that per Dr. Gwenevere Ghazi treatment plan, patient is on IV ceftriaxone and PO rifampin. Relayed that her orders state patient should have weekly CBC w/diff, CMP, ESR, and CRP. Asked that she please call back with any questions.   Patient's wife would like to know if patient should be on both rifampin and ceftriaxone at the same time, or if he is to start the rifampin once IV ceftriaxone is finished. Will route to provider.   Carlota Raspberry [facility] RN) [phone]  Patient's daughter: [phone]  Beryle Flock, RN

## 2021-10-08 ENCOUNTER — Ambulatory Visit: Payer: Medicare Other | Admitting: Family

## 2021-10-08 NOTE — Progress Notes (Deleted)
Patient ID: Henry Lindsey MRN: [id number]; DOB: 1933/05/07   Henry Lindsey is a 87 y.o. male with a history of CAD, ICM, HFrEF, HTN, HL, DMII, GERD, CVA, PAD, and carotid arterial disease.  Echo 08/10/21: Left Ventricle: Left ventricular ejection fraction, by estimation, is 25 to 30%. The left ventricle has severely decreased function. The left ventricle demonstrates global hypokinesis. The average left ventricular global longitudinal strain is -6.3 %. The global longitudinal strain is abnormal. The left ventricular internal  cavity size was normal in size. There is no left ventricular hypertrophy. Left ventricular diastolic parameters are consistent with Grade II diastolic dysfunction (pseudonormalization).   Right Ventricle: The right ventricular size is moderately enlarged. No increase in right ventricular wall thickness. Right ventricular systolic function is moderately reduced. There is mildly elevated pulmonary artery systolic pressure. The tricuspid  regurgitant velocity is 2.58 m/s, and with an assumed right atrial  pressure of 15 mmHg, the estimated right ventricular systolic pressure is 84.1 mmHg.   LHC 08/15/21:   Mid LM to Dist LM lesion is 30% stenosed.   Ost LAD lesion is 85% stenosed.   Prox LAD lesion is 50% stenosed.   Dist LAD lesion is 35% stenosed.   Prox Cx lesion is 50% stenosed.   Prox RCA to Dist RCA lesion is 100% stenosed.   1st Diag lesion is 80% stenosed.   1st Mrg lesion is 70% stenosed.   A drug-eluting stent was successfully placed using a STENT ONYX FRONTIER [id number].   Post intervention, there is a 0% residual stenosis.   Post intervention, there is a 0% residual stenosis.   Post intervention, there is a 0% residual stenosis.   Successful Impella supported high risk PCI of the ostial LAD with atherectomy and drug-eluting stent placement.  The stent extended 1 to 2 mm into the left main coronary artery to ensure coverage of the ostium.  There was normal flow  into the  left circumflex.Moderately elevated left ventricular end-diastolic pressure at 24 mmHg at the beginning of the case.  Admitted 10/01/21 due to infection in right knee with right knee replacement. Discharged after 3 days. Admitted 10/20-10/26/22 at [facility] due to progressive dyspnea, increasing abdominal girth, lower extremity edema and transferred to [facility] for high risk PCI. Successful Impella supported high risk PCI of the ostial LAD with atherectomy and drug-eluting stent placement.   He presents today for an urgent visit with a chief complaint of   Past Medical History:  Diagnosis Date   Anginal pain ([place])    Aortic atherosclerosis ([place])    Arthritis    BPH (benign prostatic hyperplasia)    CAD (coronary artery disease)    a.) 2/22 Cath: LM 50d, LAD sev ost dz, mod-sev prox/mid dz. D1 sev dz. LCX mild prox dzs, OM2 mod-sev dz, RCA 100p. RPL and RPDA fill via L-R collats. EF 35%. Seen by CVTS->Med mgmt. b.) R/LHC 08/14/21: 30% m-d-LM, 85% oLAD, 50% pLAD, 35% dLAD, 80% D1, 50% pLCx, 70% OM1, 100% p-dRCA -> trans to [facility]. c.) 08/15/21 Impella supported HIGH RISK PCI with atherectomy. 2.75 x 3mm Onyx Frontier DES oLAD.   Carotid artery stenosis    a. 11/2010 U/S: <50% bilaterally; b. 03/2021 40-59% bilat ICA stenoses.   COPD (chronic obstructive pulmonary disease) ([place])    noted CT 10/05/16 former smoker quit age 60    CVA (cerebral infarction) 11/2010   a.) RIGHT thalamic lacunar   GERD (gastroesophageal reflux disease)    Heart murmur  HFrEF (heart failure with reduced ejection fraction) ([place])    a. 10/2020 Echo: EF 35%.   Hyperlipidemia    Hypertension    Ischemic cardiomyopathy    a. 10/2020 Echo: EF 35%. Nl RVSP. Mod MR. Mild TR; b. 11/2020 Cath:  CO/CI 5.05/2.49. LV gram: EF 35%.   Lower extremity cellulitis    NSTEMI (non-ST elevated myocardial infarction) ([place]) 08/09/2021   a.) R/LHC 08/14/2021: 30% m-d-LM, 85% oLAD, 50% pLAD, 35% dLAD, 80% D1, 50% pLCx, 70% OM1, 100% p-dRCA  -> transfer to [facility]. b.) 08/15/2021 Impella supported HIGH RISK PCI with atherectomy. 2.75 x 60mm Onyx Frontier DES to [employer].   Peripheral vascular disease in diabetes mellitus ([place])    a. 06/2012 95% occlusion s/p PTA R SFA (Dr. Lucky Cowboy); b. 03/2021 ABIs: R 1.02, L 0.96.   Pneumonia    Type 2 diabetes mellitus treated with insulin [facility])    Past Surgical History:  Procedure Laterality Date   ABDOMINAL AORTOGRAM N/A 08/14/2021   Procedure: ABDOMINAL AORTOGRAM;  Surgeon: Nelva Bush, MD;  Location: [place] CV LAB;  Service: Cardiovascular;  Laterality: N/A;   CHOLECYSTECTOMY N/A 11/13/2016   Procedure: LAPAROSCOPIC CHOLECYSTECTOMY WITH INTRAOPERATIVE CHOLANGIOGRAM;  Surgeon: Olean Ree, MD;  Location: [facility] ORS;  Service: General;  Laterality: N/A;   COLONOSCOPY WITH PROPOFOL N/A 02/10/2018   Procedure: COLONOSCOPY WITH PROPOFOL;  Surgeon: Lucilla Lame, MD;  Location: [facility] ENDOSCOPY;  Service: Endoscopy;  Laterality: N/A;   CORONARY ATHERECTOMY N/A 08/15/2021   Procedure: CORONARY ATHERECTOMY;  Surgeon: Wellington Hampshire, MD;  Location: [place] CV LAB;  Service: Cardiovascular;  Laterality: N/A;   CORONARY STENT INTERVENTION W/IMPELLA N/A 08/15/2021   Procedure: Coronary Stent Intervention w/Impella;  Surgeon: Wellington Hampshire, MD;  Location: [place] CV LAB;  Service: Cardiovascular;  Laterality: N/A;   ERCP N/A 11/17/2016   Procedure: ENDOSCOPIC RETROGRADE CHOLANGIOPANCREATOGRAPHY (ERCP);  Surgeon: Irene Shipper, MD;  Location: [facility] ENDOSCOPY;  Service: Endoscopy;  Laterality: N/A;   ERCP N/A 02/04/2017   Procedure: ENDOSCOPIC RETROGRADE CHOLANGIOPANCREATOGRAPHY (ERCP) Stent removal;  Surgeon: Lucilla Lame, MD;  Location: [facility] ENDOSCOPY;  Service: Endoscopy;  Laterality: N/A;   IR GENERIC HISTORICAL  10/06/2016   IR PERC CHOLECYSTOSTOMY 10/06/2016 Aletta Edouard, MD [facility]-INTERV RAD   JOINT REPLACEMENT Left 2014   left knee   RIGHT/LEFT HEART CATH AND CORONARY ANGIOGRAPHY N/A 11/24/2020    Procedure: RIGHT/LEFT HEART CATH AND CORONARY ANGIOGRAPHY;  Surgeon: Minna Merritts, MD;  Location: [place] CV LAB;  Service: Cardiovascular;  Laterality: N/A;   RIGHT/LEFT HEART CATH AND CORONARY ANGIOGRAPHY N/A 08/14/2021   Procedure: RIGHT/LEFT HEART CATH AND CORONARY ANGIOGRAPHY;  Surgeon: Nelva Bush, MD;  Location: [place] CV LAB;  Service: Cardiovascular;  Laterality: N/A;   TOTAL KNEE ARTHROPLASTY Right 08/08/2020   Procedure: TOTAL KNEE ARTHROPLASTY;  Surgeon: Lovell Sheehan, MD;  Location: [facility] ORS;  Service: Orthopedics;  Laterality: Right;   TOTAL KNEE REVISION Right 10/01/2021   Procedure: IRRIGATION AND DEBRIDMENT RIGHT KNEE WITH POLY EXCHANGE;  Surgeon: Lovell Sheehan, MD;  Location: [facility] ORS;  Service: Orthopedics;  Laterality: Right;   UPPER GASTROINTESTINAL ENDOSCOPY     WRIST SURGERY     Family History  Problem Relation Age of Onset   Diabetes Mother    Social History   Tobacco Use   Smoking status: Former    Packs/day: 0.50    Years: 33.00    Pack years: 16.50    Types: Cigarettes    Quit date: 04/21/1968  Years since quitting: 53.5   Smokeless tobacco: Never  Substance Use Topics   Alcohol use: No   Allergies  Allergen Reactions   Jardiance [Empagliflozin]     Rash all over and lips turn purple.    Metformin And Related Diarrhea   Trazodone Other (See Comments)    Extreme hallucinations/ psychosis      Review of Systems  Constitutional:  Positive for malaise/fatigue.  HENT: Negative.    Eyes: Negative.   Respiratory:  Positive for shortness of breath (with exertion). Negative for cough and wheezing.   Cardiovascular:  Positive for leg swelling. Negative for chest pain, palpitations and orthopnea.  Gastrointestinal: Negative.   Genitourinary: Negative.   Musculoskeletal: Negative.  Negative for falls.  Skin: Negative.   Neurological:  Negative for dizziness, weakness and headaches.  Endo/Heme/Allergies: Negative.    Psychiatric/Behavioral:  The patient is not nervous/anxious and does not have insomnia.        Physical Exam Vitals and nursing note reviewed. Exam conducted with a chaperone present (friend).  Constitutional:      General: He is not in acute distress.    Appearance: He is not ill-appearing.  Neck:     Vascular: No carotid bruit.  Cardiovascular:     Rate and Rhythm: Normal rate and regular rhythm.     Heart sounds: Normal heart sounds.  Pulmonary:     Effort: No respiratory distress.     Breath sounds: No wheezing or rales.  Abdominal:     Palpations: Abdomen is soft.  Musculoskeletal:     Right lower leg: Edema present.     Left lower leg: Edema present.  Skin:    General: Skin is warm and dry.  Neurological:     Mental Status: He is alert and oriented to person, place, and time.  Psychiatric:        Mood and Affect: Mood normal.        Thought Content: Thought content normal.     Comments: Poor historian, unclear if related to cognitive decline or recent hospitalization      Assessment & Plan: Chronic Heart failure with reduced ejection fraction- - NYHA III - euvolemic - weighing daily; instructed to call for an overnight weight gain of > 2 pounds or a weekly weight gain of > 5 pounds - weight 178.6 from last visit here 6 weeks ago - on GDMT of metoprolol succinate and spironolactone  - allergic to Jardiance - BP historically soft - O2 HS and at rest since dc - advised to stop taking cilostazol as it's bad for your heart; cardiology had previously advised to stop it and patient thinks he's not taking it anymore but is unclear - BNP 08/09/21 4500  2.  CAD - DES to ostial LAD - ASA, plavix - saw cardiology Kathlen Mody) 09/25/21  3.  DM - A1c 09/26/21 was 7.0% - BMP 10/02/21 reviewed and showed sodium 133, potassium 4.3, creatinine 1.15 and GFR >60  4. Lymphedema - stage 2 - limited in his ability to exercise due to his shortness of breath - woody appearance but no  open wounds - unclear how or if he wears compression socks, however he was not wearing at visit - consider compression boots if edema persists   Medication bottles brought in and reviewed. Attempted to clarify medication list. Pts dtr fills his medication pill box every three weeks.

## 2021-10-11 ENCOUNTER — Telehealth: Payer: Self-pay

## 2021-10-11 ENCOUNTER — Telehealth: Payer: Self-pay | Admitting: Cardiovascular Disease

## 2021-10-11 DIAGNOSIS — I5022 Chronic systolic (congestive) heart failure: Secondary | ICD-10-CM

## 2021-10-11 MED ORDER — FUROSEMIDE 20 MG PO TABS: ORAL_TABLET | ORAL | 11 refills | Status: DC

## 2021-10-11 NOTE — Telephone Encounter (Signed)
Amy with [facility] calling States since patient came from home from hospital he has not been feeling well Home Health nurse requested patient see Darylene Price but they have been refusing to go to appts Patient had labs done on Monday and she would like Dr Rockey Situ to review and suggest how they should proceed with this patient  Please call Amy at [phone]

## 2021-10-11 NOTE — Telephone Encounter (Signed)
[facility] Rn calling to get orders for the CMP. That was verbally added on by calling [facility] and spoke to Abby. Daughter is telling [place] this is needing done by 1 pm Friday. I advised HH that it is not a STAT add on and that we are aware that labs are drawn on Monday but if Henry Lindsey can draw this when she goes to teach on the pressure Ball that would be fine. Henry Lindsey will do that IF weather permits and supplies arrives at decent hour otherwise labs may not be done until Monday and she will do teaching via video chat.

## 2021-10-11 NOTE — Telephone Encounter (Signed)
Patient Pressure Ball order given to AMY at [facility]. Also discussed with [facility] RN AMY. (Two different Amy's)

## 2021-10-11 NOTE — Telephone Encounter (Signed)
Called Amy, [facility] RN back. Relayed to her medication reccs from Dr. Rockey Situ that I was unable to relay to pt's daughter earlier in the day. Pt is to:   - take 40 mg of lasix bid for two days - take 20 mg of lasix bid thereafter - if at any point he has further leg swelling, weight gain, or SOB, take 40 mg bid as needed   Amy verbalized understanding and voiced appreciation for call.

## 2021-10-11 NOTE — Telephone Encounter (Signed)
Called Candace to go over Dr. Donivan Scull reccs regarding leg swelling, weight gain, and lasix.   No answer, lmtcb.

## 2021-10-11 NOTE — Telephone Encounter (Signed)
Verified Pressure Ball now arriving Friday. I have spoken with [facility] RN Amy who will communicate with daughter and will do the teaching with daughter on Friday in person or via Video if weather is an issue.Daughter understands and has contact info to reach out to [facility] and myself over holiday.

## 2021-10-11 NOTE — Telephone Encounter (Signed)
See additional telephone encounter for pt. Spoke with Otila Kluver, [facility] RN regarding Dr. Donivan Scull reccs.

## 2021-10-16 ENCOUNTER — Telehealth: Payer: Self-pay

## 2021-10-16 NOTE — Telephone Encounter (Signed)
Called Candace (daughter) to inquire on how things are going since switching to [facility] for IV abx. She reports his nausea is way better and he is calmer. She states he did not see cardiology but did increase lasix which did help with swelling. He had labs drawn yesterday which I have located and faxed to Dr Delaine Lame. Candace would like a call back about results. Also reported he is on pain med minus the tylenol to protect liver.

## 2021-10-18 ENCOUNTER — Other Ambulatory Visit: Payer: Self-pay

## 2021-10-18 ENCOUNTER — Telehealth: Payer: Self-pay | Admitting: Cardiovascular Disease

## 2021-10-18 ENCOUNTER — Ambulatory Visit (HOSPITAL_BASED_OUTPATIENT_CLINIC_OR_DEPARTMENT_OTHER): Payer: Medicare Other | Admitting: Family

## 2021-10-18 ENCOUNTER — Ambulatory Visit
Admission: RE | Admit: 2021-10-18 | Discharge: 2021-10-18 | Disposition: A | Discharge status: Home / Self Care | Payer: Medicare Other | Source: Ambulatory Visit | Attending: Family | Admitting: Family

## 2021-10-18 ENCOUNTER — Encounter: Payer: Self-pay | Admitting: Family

## 2021-10-18 ENCOUNTER — Other Ambulatory Visit: Payer: Self-pay | Admitting: Family

## 2021-10-18 VITALS — BP 132/68 | HR 87 | Resp 18 | Ht 71.0 in | Wt 193.5 lb

## 2021-10-18 DIAGNOSIS — I255 Ischemic cardiomyopathy: Secondary | ICD-10-CM | POA: Insufficient documentation

## 2021-10-18 DIAGNOSIS — I110 Hypertensive heart disease with heart failure: Secondary | ICD-10-CM | POA: Diagnosis not present

## 2021-10-18 DIAGNOSIS — I5023 Acute on chronic systolic (congestive) heart failure: Secondary | ICD-10-CM

## 2021-10-18 DIAGNOSIS — Z96651 Presence of right artificial knee joint: Secondary | ICD-10-CM | POA: Diagnosis not present

## 2021-10-18 DIAGNOSIS — I2510 Atherosclerotic heart disease of native coronary artery without angina pectoris: Secondary | ICD-10-CM | POA: Insufficient documentation

## 2021-10-18 DIAGNOSIS — E1122 Type 2 diabetes mellitus with diabetic chronic kidney disease: Secondary | ICD-10-CM | POA: Diagnosis not present

## 2021-10-18 DIAGNOSIS — I890 Lymphedema, not elsewhere classified: Secondary | ICD-10-CM

## 2021-10-18 DIAGNOSIS — Z794 Long term (current) use of insulin: Secondary | ICD-10-CM

## 2021-10-18 DIAGNOSIS — Z87891 Personal history of nicotine dependence: Secondary | ICD-10-CM | POA: Diagnosis not present

## 2021-10-18 DIAGNOSIS — I739 Peripheral vascular disease, unspecified: Secondary | ICD-10-CM | POA: Diagnosis not present

## 2021-10-18 DIAGNOSIS — E119 Type 2 diabetes mellitus without complications: Secondary | ICD-10-CM | POA: Diagnosis not present

## 2021-10-18 DIAGNOSIS — E785 Hyperlipidemia, unspecified: Secondary | ICD-10-CM | POA: Insufficient documentation

## 2021-10-18 DIAGNOSIS — Z8673 Personal history of transient ischemic attack (TIA), and cerebral infarction without residual deficits: Secondary | ICD-10-CM | POA: Diagnosis not present

## 2021-10-18 DIAGNOSIS — Z955 Presence of coronary angioplasty implant and graft: Secondary | ICD-10-CM | POA: Diagnosis not present

## 2021-10-18 DIAGNOSIS — I779 Disorder of arteries and arterioles, unspecified: Secondary | ICD-10-CM | POA: Diagnosis not present

## 2021-10-18 DIAGNOSIS — Z888 Allergy status to other drugs, medicaments and biological substances status: Secondary | ICD-10-CM | POA: Insufficient documentation

## 2021-10-18 DIAGNOSIS — N181 Chronic kidney disease, stage 1: Secondary | ICD-10-CM

## 2021-10-18 DIAGNOSIS — I25118 Atherosclerotic heart disease of native coronary artery with other forms of angina pectoris: Secondary | ICD-10-CM

## 2021-10-18 MED ORDER — POTASSIUM CHLORIDE CRYS ER 20 MEQ PO TBCR
40.0000 meq | EXTENDED_RELEASE_TABLET | Freq: Once | ORAL | Status: AC
  Administered 2021-10-18: 14:00:00 40 meq via ORAL

## 2021-10-18 MED ORDER — FUROSEMIDE 10 MG/ML IJ SOLN
80.0000 mg | Freq: Once | INTRAMUSCULAR | Status: AC
  Administered 2021-10-18: 14:00:00 80 mg via INTRAVENOUS

## 2021-10-18 MED ORDER — FUROSEMIDE 10 MG/ML IJ SOLN
INTRAMUSCULAR | Status: AC
  Administered 2021-10-18: 14:00:00 40 mg
  Filled 2021-10-18: qty 8

## 2021-10-18 MED ORDER — POTASSIUM CHLORIDE CRYS ER 20 MEQ PO TBCR
EXTENDED_RELEASE_TABLET | ORAL | Status: AC
  Administered 2021-10-18: 14:00:00 20 meq
  Filled 2021-10-18: qty 2

## 2021-10-18 NOTE — Progress Notes (Signed)
Sending for IV lasix

## 2021-10-18 NOTE — Telephone Encounter (Signed)
Was able to return call to Henry Lindsey (DPR approved), she voiced concern that Henry Lindsey is still putting on weight and has edema to LE and abd.  Pt had recent knee sx 12/12, went in hospital with weight 174 lbs, now weight is 190.5 lbs. Henry Lindsey feels pt is gaining at lesat 1 lbs daily.   Spoke with RN 12/22 and was advised  - take 40 mg of lasix bid for two days - take 20 mg of lasix bid thereafter - if at any point he has further leg swelling, weight gain, or SOB, take 40 mg bid as needed   At this time, daughter feels Henry Lindsey is only getting worse, has been taking Lasix 40 mg BID for the past 2 weeks w/o improvement.   Cadence question medication change or adjustments, Henry Lindsey was on previously on Torsemide but had a skin reaction, so this was switched to lasix by PCP in July 2022.  Advised there is Metolazone that is sometimes used, it is a thiazide-like diuretic, primarily used to treat congestive heart failure, high blood pressure, and push off excess fluids. Usually taken in low dose PRN based on weight sliding scale. But would need Henry Lindsey or Henry Lindsey to advised if this is safe and an option for pt. Cadence would like to try this medication if acceptable.   Looking through chart, no updated BMP to assess pt's K+ and renal function as there has been adjustments with pt's lasix and that he has been taking Lasix 40 mg BID. Last BMP noted was 12/13. Cadence stated [facility] took Henry Lindsey blood Monday. Asked if she could get company to fax those results to our office for review. Cadence verbalized understanding, fax number given.   At this time, no sooner appt with Henry Lindsey or [facility], month of Jan is booked, pt has an appt with Henry Lindsey 1/30 and with Henry Price, NP with heart Failure 1/4. Advised will send Henry Lindsey a message for recommendation, Henry Lindsey is out the office this week. Candeance would like advise from her office before upcoming appt as she feels her  dad is getting worse.   At this time questions were address and no additional concerns at this time. Henry Lindsey thankful for the return call and advice. Agreeable to plan, will call back for anything further.

## 2021-10-18 NOTE — Telephone Encounter (Signed)
Pt c/o swelling: STAT is pt has developed SOB within 24 hours  If swelling, where is the swelling located? In abdomen, bilateral legs, ankles, and feet. Daughter states she can visibly see swelling  How much weight have you gained and in what time span? Within a week 8 pounds  Have you gained 3 pounds in a day or 5 pounds in a week? yes  Do you have a log of your daily weights (if so, list)? Last week 182, last night 190.5. Since he has been home from the hospital, when he came home he was 174(Has been only a couple of weeks)  Are you currently taking a fluid pill? Yes,Lasix  Are you currently SOB? no  Have you traveled recently? No  BP last night 109/70

## 2021-10-18 NOTE — Patient Instructions (Signed)
Continue weighing daily and call for an overnight weight gain of 3 pounds or more or a weekly weight gain of more than 5 pounds.  °

## 2021-10-18 NOTE — Progress Notes (Signed)
Patient ID: Henry Lindsey MRN: [id number]; DOB: May 20, 1933   Henry Lindsey is a 87 y.o. male with a history of CAD, ICM, HFrEF, HTN, HL, DMII, GERD, CVA, PAD, and carotid arterial disease.  Echo 08/10/21: Left Ventricle: Left ventricular ejection fraction, by estimation, is 25 to 30%. The left ventricle has severely decreased function. The left ventricle demonstrates global hypokinesis. The average left ventricular global longitudinal strain is -6.3 %. The global longitudinal strain is abnormal. The left ventricular internal  cavity size was normal in size. There is no left ventricular hypertrophy. Left ventricular diastolic parameters are consistent with Grade II diastolic dysfunction (pseudonormalization).   Right Ventricle: The right ventricular size is moderately enlarged. No increase in right ventricular wall thickness. Right ventricular systolic function is moderately reduced. There is mildly elevated pulmonary artery systolic pressure. The tricuspid  regurgitant velocity is 2.58 m/s, and with an assumed right atrial  pressure of 15 mmHg, the estimated right ventricular systolic pressure is 87.5 mmHg.   LHC 08/15/21:   Mid LM to Dist LM lesion is 30% stenosed.   Ost LAD lesion is 85% stenosed.   Prox LAD lesion is 50% stenosed.   Dist LAD lesion is 35% stenosed.   Prox Cx lesion is 50% stenosed.   Prox RCA to Dist RCA lesion is 100% stenosed.   1st Diag lesion is 80% stenosed.   1st Mrg lesion is 70% stenosed.   A drug-eluting stent was successfully placed using a STENT ONYX FRONTIER [id number].   Post intervention, there is a 0% residual stenosis.   Post intervention, there is a 0% residual stenosis.   Post intervention, there is a 0% residual stenosis.   Successful Impella supported high risk PCI of the ostial LAD with atherectomy and drug-eluting stent placement.  The stent extended 1 to 2 mm into the left main coronary artery to ensure coverage of the ostium.  There was normal flow  into the  left circumflex.Moderately elevated left ventricular end-diastolic pressure at 24 mmHg at the beginning of the case.  Admitted 10/01/21 due to infection in right knee with right knee replacement. Discharged after 3 days. Admitted 10/20-10/26/22 at [facility] due to progressive dyspnea, increasing abdominal girth, lower extremity edema and [facility]. Successful Impella supported high risk PCI of the ostial LAD with atherectomy and drug-eluting stent placement.   He presents today for an urgent visit with a chief complaint of weight gain. He says that in the last 2 weeks, he's gained ~ 14 pounds with increased swelling. He has associated fatigue and pedal edema (worsening) along with this. He denies any dizziness, difficulty sleeping, shortness of breath, wheezing, cough, palpitations or chest pain.   Has about 4 more weeks of IV antibiotics to treat right knee infection. Didn't realize that he should only drink a certain amount of fluids. Drinking 64 ounces of water along with ~ 40 ounces of milk daily.   Past Medical History:  Diagnosis Date   Anginal pain ([place])    Aortic atherosclerosis (HCC)    Arthritis    BPH (benign prostatic hyperplasia)    CAD (coronary artery disease)    a.) 2/22 Cath: LM 50d, LAD sev ost dz, mod-sev prox/mid dz. D1 sev dz. LCX mild prox dzs, OM2 mod-sev dz, RCA 100p. RPL and RPDA fill via L-R collats. EF 35%. Seen by CVTS->Med mgmt. b.) R/LHC 08/14/21: 30% m-d-LM, 85% oLAD, 50% pLAD, 35% dLAD, 80% D1, 50% pLCx, 70% OM1, 100% p-dRCA ->  trans to [facility]. c.) 08/15/21 Impella supported HIGH RISK PCI with atherectomy. 2.75 x 5m Onyx Frontier DES oLAD.   Carotid artery stenosis    a. 11/2010 U/S: <50% bilaterally; b. 03/2021 40-59% bilat ICA stenoses.   COPD (chronic obstructive pulmonary disease) [place]    noted CT 10/05/16 former smoker quit age 87   CVA (cerebral infarction) 11/2010   a.) RIGHT thalamic lacunar   GERD (gastroesophageal reflux  disease)    Heart murmur    HFrEF (heart failure with reduced ejection fraction) ([place]    a. 10/2020 Echo: EF 35%.   Hyperlipidemia    Hypertension    Ischemic cardiomyopathy    a. 10/2020 Echo: EF 35%. Nl RVSP. Mod MR. Mild TR; b. 11/2020 Cath:  CO/CI 5.05/2.49. LV gram: EF 35%.   Lower extremity cellulitis    NSTEMI (non-ST elevated myocardial infarction) ([place] 08/09/2021   a.) R/LHC 08/14/2021: 30% m-d-LM, 85% oLAD, 50% pLAD, 35% dLAD, 80% D1, 50% pLCx, 70% OM1, 100% p-dRCA -> transfer to [facility]. b.) 08/15/2021 Impella supported HIGH RISK PCI with atherectomy. 2.75 x 254mOnyx Frontier DES to [facility]  Peripheral vascular disease in diabetes mellitus ([facility]   a. 06/2012 95% occlusion s/p PTA R SFA (Dr. DeLucky Cowboy b. 03/2021 ABIs: R 1.02, L 0.96.   Pneumonia    Type 2 [facility] mellitus [facility]   Past Surgical History:  Procedure Laterality Date   ABDOMINAL AORTOGRAM N/A 08/14/2021   Procedure: ABDOMINAL AORTOGRAM;  Surgeon: EnNelva BushMD;  [facility];  Service: Cardiovascular;  Laterality: N/A;   CHOLECYSTECTOMY N/A 11/13/2016   Procedure: LAPAROSCOPIC CHOLECYSTECTOMY WITH INTRAOPERATIVE CHOLANGIOGRAM;  Surgeon: JoOlean ReeMD;  Location: [facility] ORS;  Service: General;  Laterality: N/A;   COLONOSCOPY WITH PROPOFOL N/A 02/10/2018   Procedure: COLONOSCOPY WITH PROPOFOL;  Surgeon: WoLucilla LameMD;  Location: [facility] ENDOSCOPY;  Service: Endoscopy;  Laterality: N/A;   CORONARY ATHERECTOMY N/A 08/15/2021   Procedure: CORONARY ATHERECTOMY;  Surgeon: ArWellington HampshireMD;  [place] [facility];  Service: Cardiovascular;  Laterality: N/A;   CORONARY STENT INTERVENTION W/IMPELLA N/A 08/15/2021   Procedure: Coronary Stent Intervention w/Impella;  Surgeon: ArWellington HampshireMD;  [facility];  Service: Cardiovascular;  Laterality: N/A;   ERCP N/A 11/17/2016   Procedure: ENDOSCOPIC RETROGRADE CHOLANGIOPANCREATOGRAPHY (ERCP);  Surgeon: JoIrene ShipperMD;  [facility];  Service: Endoscopy;  Laterality: N/A;   ERCP N/A 02/04/2017   Procedure: ENDOSCOPIC RETROGRADE CHOLANGIOPANCREATOGRAPHY (ERCP) Stent removal;  Surgeon: DaLucilla LameMD;  Location: [facility] ENDOSCOPY;  Service: Endoscopy;  Laterality: N/A;   IR GENERIC HISTORICAL  10/06/2016   IR PERC CHOLECYSTOSTOMY 10/06/2016 GlAletta EdouardMD [facility]-INTERV RAD   JOINT REPLACEMENT Left 2014   left knee   RIGHT/LEFT HEART CATH AND CORONARY ANGIOGRAPHY N/A 11/24/2020   Procedure: RIGHT/LEFT HEART CATH AND CORONARY ANGIOGRAPHY;  Surgeon: GoMinna MerrittsMD;  [place];  Service: Cardiovascular;  Laterality: N/A;   RIGHT/LEFT HEART CATH AND CORONARY ANGIOGRAPHY N/A 08/14/2021   Procedure: RIGHT/LEFT HEART CATH AND CORONARY ANGIOGRAPHY;  Surgeon: EnNelva BushMD;  [facility];  Service: Cardiovascular;  Laterality: N/A;   TOTAL KNEE ARTHROPLASTY Right 08/08/2020   Procedure: TOTAL KNEE ARTHROPLASTY;  Surgeon: BoLovell SheehanMD;  Location: [facility] ORS;  Service: Orthopedics;  Laterality: Right;   TOTAL KNEE REVISION Right 10/01/2021   Procedure: IRRIGATION AND DEBRIDMENT RIGHT KNEE WITH POLY EXCHANGE;  Surgeon: Lovell Sheehan, MD;  Location: [facility] ORS;  Service: Orthopedics;  Laterality: Right;   UPPER GASTROINTESTINAL ENDOSCOPY     WRIST SURGERY     Family History  Problem Relation Age of Onset   Diabetes Mother    Social History   Tobacco Use   Smoking status: Former    Packs/day: 0.50    Years: 33.00    Pack years: 16.50    Types: Cigarettes    Quit date: 04/21/1968    Years since quitting: 53.5   Smokeless tobacco: Never  Substance Use Topics   Alcohol use: No   Allergies  Allergen Reactions   Jardiance [Empagliflozin]     Rash all over and lips turn purple.    Metformin And Related Diarrhea   Trazodone Other (See Comments)    Extreme hallucinations/ psychosis      Prior to Admission medications   Medication Sig Start  Date End Date Taking? Authorizing Provider  albuterol (VENTOLIN HFA) 108 (90 Base) MCG/ACT inhaler Inhale 2 puffs into the lungs every 6 (six) hours as needed for shortness of breath or wheezing. 10/29/20  Yes [provider]  ascorbic acid (VITAMIN C) 500 MG tablet Take 500 mg by mouth daily.    Yes [provider]  aspirin EC 81 MG tablet Take 1 tablet (81 mg total) by mouth daily. Swallow whole. 01/31/21  Yes Minna Merritts, MD  atorvastatin (LIPITOR) 40 MG tablet Take 1 tablet (40 mg total) by mouth daily. Patient taking differently: Take 40 mg by mouth in the morning. 12/08/20  Yes Gollan, Kathlene November, MD  ceFAZolin (ANCEF) IVPB Inject 2 g into the vein every 8 (eight) hours. Indication:  MSSA prosthetic joint infection of knee First Dose: Yes Last Day of Therapy:  11/12/2021 Labs - Once weekly on MONDAYS:  CBC/D, CMP, ESR and CRP Please pull PIC at completion of IV antibiotics Fax weekly labs to [phone] Method of administration: IV Push Method of administration may be changed at the discretion of home infusion pharmacist based upon assessment of the patient and/or caregiver's ability to self-administer the medication ordered. 10/04/21 11/12/21 Yes Carlynn Spry, PA-C  cholecalciferol (VITAMIN D) 25 MCG (1000 UNIT) tablet Take 1,000 Units by mouth daily.   Yes [provider]  clopidogrel (PLAVIX) 75 MG tablet TAKE 1 TABLET(75 MG) BY MOUTH DAILY WITH BREAKFAST 09/11/21  Yes Gollan, Kathlene November, MD  diclofenac Sodium (VOLTAREN) 1 % GEL Apply 2 g topically 2 (two) times daily as needed (pain).   Yes [provider]  docusate sodium (COLACE) 100 MG capsule Take 1 capsule (100 mg total) by mouth 2 (two) times daily. 10/04/21  Yes Carlynn Spry, PA-C  furosemide (LASIX) 20 MG tablet Take 2 tablets (40 mg) twice a day for 2 days. Thereafter take 1 tablet (20 mg) twice a day. If worsening swelling, shortness of breath, or weight gain, take 2 tablets (40 mg) twice  a day as needed. 10/11/21  Yes Minna Merritts, MD  gabapentin (NEURONTIN) 400 MG capsule Take 400 mg by mouth 2 (two) times daily.   Yes [provider]  glipiZIDE (GLUCOTROL) 10 MG tablet Take 10 mg by mouth daily before breakfast.   Yes [provider]  INS SYRINGE/NEEDLE 1CC/28G (B-D INSULIN SYRINGE 1CC/28G) 28G X 1/2" 1 ML MISC USE TO ADMINISTER INSULIN DAILY 02/13/16  Yes [name], MD  insulin aspart protamine- aspart (NOVOLOG MIX 70/30) (70-30) 100 UNIT/ML injection Inject 0.15 mLs (15 Units  total) into the skin 2 (two) times daily with a meal. 08/14/21  Yes Fritzi Mandes, MD  latanoprost (XALATAN) 0.005 % ophthalmic solution Place 1 drop into both eyes at bedtime. 11/01/15  Yes [provider]  metoprolol succinate (TOPROL-XL) 50 MG 24 hr tablet Take 1 tablet (50 mg total) by mouth daily. Take with or immediately following a meal. 08/21/21  Yes Bhagat, Bhavinkumar, PA  Multiple Vitamin (MULTIVITAMIN WITH MINERALS) TABS tablet Take 1 tablet by mouth in the morning.   Yes [provider]  nitroGLYCERIN (NITROSTAT) 0.4 MG SL tablet Place 1 tablet (0.4 mg total) under the tongue every 5 (five) minutes as needed for chest pain. MAXIMUM 3 TABLETS 12/08/20  Yes Gollan, Kathlene November, MD  ONE TOUCH ULTRA TEST test strip USE 1 STRIP TO TEST THREE TIMES DAILY AS DIRECTED 08/31/18  Yes [name], MD  [facility] DELICA LANCETS 57S MISC Use three times daily to check blood sugar. 10/18/15  Yes [name], MD  pantoprazole (PROTONIX) 40 MG tablet Take 40 mg by mouth daily.   Yes [provider]  rifampin (RIFADIN) 300 MG capsule Take 1 capsule (300 mg total) by mouth 2 (two) times daily. 10/04/21 11/12/21 Yes Carlynn Spry, PA-C  spironolactone (ALDACTONE) 25 MG tablet Take 0.5 tablets (12.5 mg total) by mouth daily. 08/21/21  Yes Bhagat, Bhavinkumar, PA  tamsulosin (FLOMAX) 0.4 MG CAPS capsule Take 0.4 mg by mouth daily after breakfast.   Yes [provider]  timolol (TIMOPTIC) 0.5 % ophthalmic solution Place 1 drop into both eyes in the morning. 12/19/17  Yes [provider]  vitamin B-12 (CYANOCOBALAMIN) 500 MCG tablet Take 500 mcg by mouth daily.   Yes [provider]  vitamin E 180 MG (400 UNITS) capsule Take 400 Units by mouth daily.   Yes [provider]  Blood Glucose Monitoring Suppl (ONE TOUCH ULTRA SYSTEM KIT) w/Device KIT 1 kit by Does not apply route once. Use DX code E11.59 10/18/15   [name], MD  cyclobenzaprine (FLEXERIL) 5 MG tablet Take 5-10 mg by mouth daily as needed for muscle spasms. Patient not taking: Reported on 10/18/2021 07/31/21   [provider]  HYDROcodone-acetaminophen (NORCO/VICODIN) 5-325 MG tablet Take 1 tablet by mouth every 6 (six) hours as needed (pain.). Patient not taking: Reported on 10/18/2021 10/27/20   [provider]  HYDROcodone-acetaminophen (NORCO/VICODIN) 5-325 MG tablet Take 1 tablet by mouth every 4 (four) hours as needed for moderate pain (pain score 4-6). Patient not taking: Reported on 10/18/2021 10/04/21   Carlynn Spry, PA-C  sacubitril-valsartan (ENTRESTO) 24-26 MG Take 1 tablet by mouth 2 (two) times daily. Patient not taking: Reported on 10/05/2021 09/25/21 11/24/21  Kathlen Mody, Cadence H, PA-C   Review of Systems  Constitutional:  Positive for malaise/fatigue.  HENT: Negative.    Eyes: Negative.   Respiratory:  Negative for cough, shortness of breath and wheezing.   Cardiovascular:  Positive for leg swelling (worsening). Negative for chest pain, palpitations and orthopnea.  Gastrointestinal:  Positive for constipation.       Abdominal distention  Genitourinary: Negative.   Musculoskeletal: Negative.  Negative for falls.  Skin: Negative.   Neurological:  Negative for dizziness, weakness and headaches.  Endo/Heme/Allergies: Negative.   Psychiatric/Behavioral:  The patient is not nervous/anxious and does not have insomnia.      Vitals:   10/18/21 1215  BP: 132/68  Pulse: 87  Resp: 18  SpO2: 100%  Weight: 193 lb 8 oz (87.8  kg)  Height: _0  (1.803 m)   Wt Readings from Last 3 Encounters:  10/18/21 193 lb 8 oz (87.8 kg)  10/01/21 175 lb (79.4 kg)  09/25/21 176 lb (79.8 kg)   Lab Results  Component Value Date   CREATININE 1.15 10/02/2021   CREATININE 1.07 08/20/2021   CREATININE 1.18 08/19/2021    Physical Exam Vitals and nursing note reviewed. Exam conducted with a chaperone present (daughter).  Constitutional:      General: He is not in acute distress.    Appearance: He is not ill-appearing.  Neck:     Vascular: No carotid bruit.  Cardiovascular:     Rate and Rhythm: Normal rate and regular rhythm.     Heart sounds: Normal heart sounds.  Pulmonary:     Effort: No respiratory distress.     Breath sounds: Rales (bilateral lower lobes) present. No wheezing.  Abdominal:     General: There is distension.     Palpations: Abdomen is soft.  Musculoskeletal:     Right lower leg: Edema (2+ pitting) present.     Left lower leg: Edema (2+ pitting) present.  Skin:    General: Skin is warm and dry.  Neurological:     Mental Status: He is alert and oriented to person, place, and time.  Psychiatric:        Mood and Affect: Mood normal.        Thought Content: Thought content normal.     Assessment & Plan: Acute on Chronic Heart failure with reduced ejection fraction- - NYHA III - moderately fluid overloaded with increased weight, pedal edema and rales in lower lobes.  - will send for 54m IV lasix/ 455m PO potassium today; just had recent labs so will not repeat today - weighing daily; reminded to call for an overnight weight gain of > 2 pounds or a weekly weight gain of > 5 pounds - weight up 15 pounds from last visit here 7 weeks ago - on GDMT of metoprolol succinate and spironolactone  - allergic to Jardiance - BP historically soft; today is 132/68 - drinking too much fluids (64 ounces  water/ 40 ounces milk daily); didn't realize he needed to keep total daily fluid intake at 64 ounces - O2 @ 2L QHS and PRN during the day - BNP 08/09/21 4500  2.  CAD - DES to ostial LAD - ASA, plavix - saw cardiology (FKathlen Mody12/6/22  3.  DM - A1c 09/26/21 was 7.0% - BMP 10/15/21 (home health) reviewed and showed sodium 134, potassium 3.8, creatinine 1.17 and GFR 61 - glucose at home today was 121  4. Lymphedema - stage 2 - limited in his ability to exercise due to his shortness of breath - woody appearance but no open wounds - does not have compression socks on ; encouraged to wear them daily with removal at bedtime - consider compression boots if edema persists   Medication list reviewed.   Will have patient return tomorrow for recheck of symptoms. May need additional IV lasix and will need to check labs tomorrow.

## 2021-10-19 ENCOUNTER — Other Ambulatory Visit: Payer: Self-pay | Admitting: Infectious Diseases

## 2021-10-19 ENCOUNTER — Ambulatory Visit
Admission: RE | Admit: 2021-10-19 | Discharge: 2021-10-19 | Disposition: A | Discharge status: Home / Self Care | Payer: Medicare Other | Source: Ambulatory Visit | Attending: Family | Admitting: Family

## 2021-10-19 ENCOUNTER — Other Ambulatory Visit: Payer: Self-pay | Admitting: Family

## 2021-10-19 ENCOUNTER — Encounter: Payer: Self-pay | Admitting: Family

## 2021-10-19 ENCOUNTER — Ambulatory Visit (HOSPITAL_BASED_OUTPATIENT_CLINIC_OR_DEPARTMENT_OTHER): Payer: Medicare Other | Admitting: Family

## 2021-10-19 VITALS — BP 103/68 | HR 77 | Ht 71.0 in | Wt 195.5 lb

## 2021-10-19 DIAGNOSIS — I25118 Atherosclerotic heart disease of native coronary artery with other forms of angina pectoris: Secondary | ICD-10-CM | POA: Diagnosis not present

## 2021-10-19 DIAGNOSIS — I5023 Acute on chronic systolic (congestive) heart failure: Secondary | ICD-10-CM

## 2021-10-19 DIAGNOSIS — Z794 Long term (current) use of insulin: Secondary | ICD-10-CM

## 2021-10-19 DIAGNOSIS — E1122 Type 2 diabetes mellitus with diabetic chronic kidney disease: Secondary | ICD-10-CM

## 2021-10-19 DIAGNOSIS — K5900 Constipation, unspecified: Secondary | ICD-10-CM

## 2021-10-19 DIAGNOSIS — I890 Lymphedema, not elsewhere classified: Secondary | ICD-10-CM

## 2021-10-19 DIAGNOSIS — I5022 Chronic systolic (congestive) heart failure: Secondary | ICD-10-CM

## 2021-10-19 DIAGNOSIS — N181 Chronic kidney disease, stage 1: Secondary | ICD-10-CM

## 2021-10-19 LAB — BASIC METABOLIC PANEL
Anion gap: 8 (ref 5–15)
BUN: 40 mg/dL — ABNORMAL HIGH (ref 8–23)
CO2: 28 mmol/L (ref 22–32)
Calcium: 8.6 mg/dL — ABNORMAL LOW (ref 8.9–10.3)
Chloride: 97 mmol/L — ABNORMAL LOW (ref 98–111)
Creatinine, Ser: 1.11 mg/dL (ref 0.61–1.24)
GFR, Estimated: >60 mL/min (ref >60)
Glucose, Bld: 68 mg/dL — ABNORMAL LOW (ref 70–99)
Potassium: 4.3 mmol/L (ref 3.5–5.1)
Sodium: 133 mmol/L — ABNORMAL LOW (ref 135–145)

## 2021-10-19 LAB — BRAIN NATRIURETIC PEPTIDE: B Natriuretic Peptide: 2372.6 pg/mL — ABNORMAL HIGH (ref 0.0–100.0)

## 2021-10-19 MED ORDER — POTASSIUM CHLORIDE CRYS ER 20 MEQ PO TBCR
40.0000 meq | EXTENDED_RELEASE_TABLET | Freq: Once | ORAL | Status: AC
  Administered 2021-10-19: 13:00:00 40 meq via ORAL

## 2021-10-19 MED ORDER — FUROSEMIDE 40 MG PO TABS: 40.0000 mg | ORAL_TABLET | Freq: Two times a day (BID) | ORAL | 5 refills | Status: DC

## 2021-10-19 MED ORDER — FUROSEMIDE 10 MG/ML IJ SOLN
INTRAMUSCULAR | Status: AC
  Filled 2021-10-19: qty 8

## 2021-10-19 MED ORDER — POTASSIUM CHLORIDE CRYS ER 20 MEQ PO TBCR
EXTENDED_RELEASE_TABLET | ORAL | Status: AC
  Filled 2021-10-19: qty 2

## 2021-10-19 MED ORDER — FUROSEMIDE 10 MG/ML IJ SOLN
80.0000 mg | Freq: Once | INTRAMUSCULAR | Status: AC
  Administered 2021-10-19: 13:00:00 80 mg via INTRAVENOUS

## 2021-10-19 NOTE — Progress Notes (Signed)
Patient ID: Henry Lindsey MRN: [id number]; DOB: 08-Jun-1933   Henry Lindsey is a 87 y.o. male with a history of CAD, ICM, HFrEF, HTN, HL, DMII, GERD, CVA, PAD, and carotid arterial disease.  Echo 08/10/21: Left Ventricle: Left ventricular ejection fraction, by estimation, is 25 to 30%. The left ventricle has severely decreased function. The left ventricle demonstrates global hypokinesis. The average left ventricular global longitudinal strain is -6.3 %. The global longitudinal strain is abnormal. The left ventricular internal  cavity size was normal in size. There is no left ventricular hypertrophy. Left ventricular diastolic parameters are consistent with Grade II diastolic dysfunction (pseudonormalization).   Right Ventricle: The right ventricular size is moderately enlarged. No increase in right ventricular wall thickness. Right ventricular systolic function is moderately reduced. There is mildly elevated pulmonary artery systolic pressure. The tricuspid  regurgitant velocity is 2.58 m/s, and with an assumed right atrial  pressure of 15 mmHg, the estimated right ventricular systolic pressure is 40.9 mmHg.   LHC 08/15/21:   Mid LM to Dist LM lesion is 30% stenosed.   Ost LAD lesion is 85% stenosed.   Prox LAD lesion is 50% stenosed.   Dist LAD lesion is 35% stenosed.   Prox Cx lesion is 50% stenosed.   Prox RCA to Dist RCA lesion is 100% stenosed.   1st Diag lesion is 80% stenosed.   1st Mrg lesion is 70% stenosed.   A drug-eluting stent was successfully placed using a STENT ONYX FRONTIER 2.75X26.   Post intervention, there is a 0% residual stenosis.   Post intervention, there is a 0% residual stenosis.   Post intervention, there is a 0% residual stenosis.   Successful Impella supported high risk PCI of the ostial LAD with atherectomy and drug-eluting stent placement.  The stent extended 1 to 2 mm into the left main coronary artery to ensure coverage of the ostium.  There was normal flow  into the  left circumflex.Moderately elevated left ventricular end-diastolic pressure at 24 mmHg at the beginning of the case.  Admitted 10/01/21 due to infection in right knee with right knee replacement. Discharged after 3 days. Admitted 10/20-10/26/22 at [facility] due to progressive dyspnea, increasing abdominal girth, lower extremity edema and transferred to [facility] for high risk PCI. Successful Impella supported high risk PCI of the ostial LAD with atherectomy and drug-eluting stent placement.   He presents today for a follow-up visit with a chief complaint of moderate fatigue with minimal exertion. He describes this as chronic in nature having been present for several months. He has associated pedal edema (better), constipation and weight gain along with this. He denies any difficulty sleeping, dizziness, shortness of breath, cough, chest pain, palpitations or abdominal distention.   Received 70m IV lasix/ 456m PO potassium yesterday & says that he urinated "a lot" and that he does feel a little better.   Has about 4 more weeks of IV antibiotics to treat right knee infection. Daughter found out that his antibiotic is mixed with a saline based solution. Is going to ask ID if it would appropriate to switch his IV antibiotics to oral.    Didn't realize that he should only drink a certain amount of fluids. Drinking 32-48 ounces of water along with ~ 48 ounces of milk daily. When reviewing his diuretic usage in more detail, he's been taking 6074murosemide QAM and 46m20mrosemide QPM for the last couple of weeks.   Has oxygen at 2L that he wears at home at  bedtime and PRN during the day. Does not have any portable oxygen that he can wear out. When he first arrived to the office on room air, his pulse ox was 75%. He was immediately placed on oxygen at 2L and his O2 sat quickly rose to 94%.   Past Medical History:  Diagnosis Date   Anginal pain ([place])    Aortic atherosclerosis (HCC)    Arthritis    BPH  (benign prostatic hyperplasia)    CAD (coronary artery disease)    a.) 2/22 Cath: LM 50d, LAD sev ost dz, mod-sev prox/mid dz. D1 sev dz. LCX mild prox dzs, OM2 mod-sev dz, RCA 100p. RPL and RPDA fill via L-R collats. EF 35%. Seen by CVTS->Med mgmt. b.) R/LHC 08/14/21: 30% m-d-LM, 85% oLAD, 50% pLAD, 35% dLAD, 80% D1, 50% pLCx, 70% OM1, 100% p-dRCA -> trans to [facility]. c.) 08/15/21 Impella supported HIGH RISK PCI with atherectomy. 2.75 x 33m Onyx Frontier DES oLAD.   Carotid artery stenosis    a. 11/2010 U/S: <50% bilaterally; b. 03/2021 40-59% bilat ICA stenoses.   COPD (chronic obstructive pulmonary disease) ([place]    noted CT 10/05/16 former smoker quit age 89   CVA (cerebral infarction) 11/2010   a.) RIGHT thalamic lacunar   GERD (gastroesophageal reflux disease)    Heart murmur    HFrEF (heart failure with reduced ejection fraction) ([place]    a. 10/2020 Echo: EF 35%.   Hyperlipidemia    Hypertension    Ischemic cardiomyopathy    a. 10/2020 Echo: EF 35%. Nl RVSP. Mod MR. Mild TR; b. 11/2020 Cath:  CO/CI 5.05/2.49. LV gram: EF 35%.   Lower extremity cellulitis    NSTEMI (non-ST elevated myocardial infarction) ([place] 08/09/2021   a.) R/LHC 08/14/2021: 30% m-d-LM, 85% oLAD, 50% pLAD, 35% dLAD, 80% D1, 50% pLCx, 70% OM1, 100% p-dRCA -> transfer to [facility]. b.) 08/15/2021 Impella supported HIGH RISK PCI with atherectomy. 2.75 x 235mOnyx Frontier DES to [facility]  Peripheral vascular disease in diabetes mellitus ([place]   a. 06/2012 95% occlusion s/p PTA R SFA (Dr. DeLucky Cowboy b. 03/2021 ABIs: R 1.02, L 0.96.   Pneumonia    [facility]   Past Surgical History:  Procedure Laterality Date   ABDOMINAL AORTOGRAM N/A 08/14/2021   Procedure: ABDOMINAL AORTOGRAM;  Surgeon: EnNelva BushMD;  [facility];  Service: Cardiovascular;  Laterality: N/A;   CHOLECYSTECTOMY N/A 11/13/2016   Procedure: LAPAROSCOPIC CHOLECYSTECTOMY WITH INTRAOPERATIVE CHOLANGIOGRAM;   Surgeon: JoOlean ReeMD;  Location: [facility] ORS;  Service: General;  Laterality: N/A;   COLONOSCOPY WITH PROPOFOL N/A 02/10/2018   Procedure: COLONOSCOPY WITH PROPOFOL;  Surgeon: WoLucilla LameMD;  Location: [facility] ENDOSCOPY;  Service: Endoscopy;  Laterality: N/A;   CORONARY ATHERECTOMY N/A 08/15/2021   Procedure: CORONARY ATHERECTOMY;  Surgeon: ArWellington HampshireMD;  [facility]: [facility];  Service: Cardiovascular;  Laterality: N/A;   CORONARY STENT INTERVENTION W/IMPELLA N/A 08/15/2021   Procedure: Coronary Stent Intervention w/Impella;  Surgeon: ArWellington HampshireMD;  [facility];  Service: Cardiovascular;  Laterality: N/A;   ERCP N/A 11/17/2016   Procedure: ENDOSCOPIC RETROGRADE CHOLANGIOPANCREATOGRAPHY (ERCP);  Surgeon: JoIrene ShipperMD;  [facility];  Service: Endoscopy;  Laterality: N/A;   ERCP N/A 02/04/2017   Procedure: ENDOSCOPIC RETROGRADE CHOLANGIOPANCREATOGRAPHY (ERCP) Stent removal;  Surgeon: DaLucilla LameMD;  Location: [facility] END[facility];  Service: Endoscopy;  Laterality: N/A;   IR GENERIC  HISTORICAL  10/06/2016   IR PERC CHOLECYSTOSTOMY 10/06/2016 Aletta Edouard, MD [facility]   JOINT REPLACEMENT Left 2014   left knee   RIGHT/LEFT HEART CATH AND CORONARY ANGIOGRAPHY N/A 11/24/2020   Procedure: RIGHT/LEFT HEART CATH AND CORONARY ANGIOGRAPHY;  Surgeon: Minna Merritts, MD;  Location: [place] CV LAB;  Service: Cardiovascular;  Laterality: N/A;   RIGHT/LEFT HEART CATH AND CORONARY ANGIOGRAPHY N/A 08/14/2021   Procedure: RIGHT/LEFT HEART CATH AND CORONARY ANGIOGRAPHY;  Surgeon: Nelva Bush, MD;  Location: [place] CV LAB;  Service: Cardiovascular;  Laterality: N/A;   TOTAL KNEE ARTHROPLASTY Right 08/08/2020   Procedure: TOTAL KNEE ARTHROPLASTY;  Surgeon: Lovell Sheehan, MD;  Location: [facility] ORS;  Service: Orthopedics;  Laterality: Right;   TOTAL KNEE REVISION Right 10/01/2021   Procedure: IRRIGATION AND DEBRIDMENT RIGHT KNEE WITH POLY  EXCHANGE;  Surgeon: Lovell Sheehan, MD;  Location: [facility] ORS;  Service: Orthopedics;  Laterality: Right;   UPPER GASTROINTESTINAL ENDOSCOPY     WRIST SURGERY     Family History  Problem Relation Age of Onset   Diabetes Mother    Social History   Tobacco Use   Smoking status: Former    Packs/day: 0.50    Years: 33.00    Pack years: 16.50    Types: Cigarettes    Quit date: 04/21/1968    Years since quitting: 53.5   Smokeless tobacco: Never  Substance Use Topics   Alcohol use: No   Allergies  Allergen Reactions   Jardiance [Empagliflozin]     Rash all over and lips turn purple.    Metformin And Related Diarrhea   Trazodone Other (See Comments)    Extreme hallucinations/ psychosis    Torsemide Rash    "Skin reaction"     Prior to Admission medications   Medication Sig Start Date End Date Taking? Authorizing Provider  albuterol (VENTOLIN HFA) 108 (90 Base) MCG/ACT inhaler Inhale 2 puffs into the lungs every 6 (six) hours as needed for shortness of breath or wheezing. 10/29/20  Yes [provider]  ascorbic acid (VITAMIN C) 500 MG tablet Take 500 mg by mouth daily.    Yes [provider]  aspirin EC 81 MG tablet Take 1 tablet (81 mg total) by mouth daily. Swallow whole. 01/31/21  Yes Minna Merritts, MD  atorvastatin (LIPITOR) 40 MG tablet Take 1 tablet (40 mg total) by mouth daily. Patient taking differently: Take 40 mg by mouth in the morning. 12/08/20  Yes Gollan, Kathlene November, MD  Blood Glucose Monitoring Suppl (ONE TOUCH ULTRA SYSTEM KIT) w/Device KIT 1 kit by Does not apply route once. Use DX code E11.59 10/18/15  Yes [name], MD  ceFAZolin (ANCEF) IVPB Inject 2 g into the vein every 8 (eight) hours. Indication:  MSSA prosthetic joint infection of knee First Dose: Yes Last Day of Therapy:  11/12/2021 Labs - Once weekly on MONDAYS:  CBC/D, CMP, ESR and CRP Please pull PIC at completion of IV antibiotics Fax weekly labs to [phone] Method of  administration: IV Push Method of administration may be changed at the discretion of home infusion pharmacist based upon assessment of the patient and/or caregiver's ability to self-administer the medication ordered. 10/04/21 11/12/21 Yes Carlynn Spry, PA-C  cholecalciferol (VITAMIN D) 25 MCG (1000 UNIT) tablet Take 1,000 Units by mouth daily.   Yes [provider]  clopidogrel (PLAVIX) 75 MG tablet TAKE 1 TABLET(75 MG) BY MOUTH DAILY WITH BREAKFAST 09/11/21  Yes Gollan, Kathlene November, MD  diclofenac Sodium (VOLTAREN) 1 % GEL Apply 2 g topically 2 (two) times daily as needed (pain).   Yes [provider]  furosemide (LASIX) 20 MG tablet Take 2 tablets (40 mg) twice a day for 2 days. Thereafter take 1 tablet (20 mg) twice a day. If worsening swelling, shortness of breath, or weight gain, take 2 tablets (40 mg) twice a day as needed. 10/11/21  Yes Minna Merritts, MD  gabapentin (NEURONTIN) 400 MG capsule Take 400 mg by mouth 2 (two) times daily.   Yes [provider]  glipiZIDE (GLUCOTROL) 10 MG tablet Take 10 mg by mouth daily before breakfast.   Yes [provider]  INS SYRINGE/NEEDLE 1CC/28G (B-D INSULIN SYRINGE 1CC/28G) 28G X 1/2" 1 ML MISC USE TO ADMINISTER INSULIN DAILY 02/13/16  Yes [name], MD  insulin aspart protamine- aspart (NOVOLOG MIX 70/30) (70-30) 100 UNIT/ML injection Inject 0.15 mLs (15 Units total) into the skin 2 (two) times daily with a meal. 08/14/21  Yes Fritzi Mandes, MD  latanoprost (XALATAN) 0.005 % ophthalmic solution Place 1 drop into both eyes at bedtime. 11/01/15  Yes [provider]  metoprolol succinate (TOPROL-XL) 50 MG 24 hr tablet Take 1 tablet (50 mg total) by mouth daily. Take with or immediately following a meal. 08/21/21  Yes Bhagat, Bhavinkumar, PA  Multiple Vitamin (MULTIVITAMIN WITH MINERALS) TABS tablet Take 1 tablet by mouth in the morning.   Yes [provider]  nitroGLYCERIN (NITROSTAT) 0.4 MG SL tablet  Place 1 tablet (0.4 mg total) under the tongue every 5 (five) minutes as needed for chest pain. MAXIMUM 3 TABLETS 12/08/20  Yes Gollan, Kathlene November, MD  ONE TOUCH ULTRA TEST test strip USE 1 STRIP TO TEST THREE TIMES DAILY AS DIRECTED 08/31/18  Yes [name], MD  [facility] DELICA LANCETS 25E MISC Use three times daily to check blood sugar. 10/18/15  Yes [name], MD  pantoprazole (PROTONIX) 40 MG tablet Take 40 mg by mouth daily.   Yes [provider]  rifampin (RIFADIN) 300 MG capsule Take 1 capsule (300 mg total) by mouth 2 (two) times daily. 10/04/21 11/12/21 Yes Carlynn Spry, PA-C  spironolactone (ALDACTONE) 25 MG tablet Take 0.5 tablets (12.5 mg total) by mouth daily. 08/21/21  Yes Bhagat, Bhavinkumar, PA  tamsulosin (FLOMAX) 0.4 MG CAPS capsule Take 0.4 mg by mouth daily after breakfast.   Yes [provider]  timolol (TIMOPTIC) 0.5 % ophthalmic solution Place 1 drop into both eyes in the morning. 12/19/17  Yes [provider]  vitamin B-12 (CYANOCOBALAMIN) 500 MCG tablet Take 500 mcg by mouth daily.   Yes [provider]  vitamin E 180 MG (400 UNITS) capsule Take 400 Units by mouth daily.   Yes [provider]  cyclobenzaprine (FLEXERIL) 5 MG tablet Take 5-10 mg by mouth daily as needed for muscle spasms. Patient not taking: Reported on 10/18/2021 07/31/21   [provider]  docusate sodium (COLACE) 100 MG capsule Take 1 capsule (100 mg total) by mouth 2 (two) times daily. Patient not taking: Reported on 10/19/2021 10/04/21   Carlynn Spry, PA-C  HYDROcodone-acetaminophen (NORCO/VICODIN) 5-325 MG tablet Take 1 tablet by mouth every 6 (six) hours as needed (pain.). Patient not taking: Reported on 10/18/2021 10/27/20   [provider]  HYDROcodone-acetaminophen (NORCO/VICODIN) 5-325 MG tablet Take 1 tablet by mouth every 4 (four) hours as needed for moderate pain (pain score 4-6). Patient not taking: Reported on 10/18/2021  10/04/21   Ronnald Ramp,  Maurice, PA-C  sacubitril-valsartan (ENTRESTO) 24-26 MG Take 1 tablet by mouth 2 (two) times daily. Patient not taking: Reported on 10/05/2021 09/25/21 11/24/21  Kathlen Mody, Cadence H, PA-C   Review of Systems  Constitutional:  Positive for malaise/fatigue.  HENT: Negative.    Eyes: Negative.   Respiratory:  Negative for cough, shortness of breath and wheezing.   Cardiovascular:  Positive for leg swelling (worsening). Negative for chest pain, palpitations and orthopnea.  Gastrointestinal:  Positive for constipation.       Abdominal distention  Genitourinary: Negative.   Musculoskeletal: Negative.  Negative for falls.  Skin: Negative.   Neurological:  Negative for dizziness, weakness and headaches.  Endo/Heme/Allergies: Negative.   Psychiatric/Behavioral:  The patient is not nervous/anxious and does not have insomnia.     Vitals:   10/19/21 1114  BP: 103/68  Pulse: 77  SpO2: 94%  Weight: 195 lb 8 oz (88.7 kg)  Height: _0  (1.803 m)   Wt Readings from Last 3 Encounters:  10/19/21 195 lb 8 oz (88.7 kg)  10/18/21 193 lb 8 oz (87.8 kg)  10/01/21 175 lb (79.4 kg)   Lab Results  Component Value Date   CREATININE 1.11 10/19/2021   CREATININE 1.15 10/02/2021   CREATININE 1.07 08/20/2021   Physical Exam Vitals and nursing note reviewed. Exam conducted with a chaperone present (daughter).  Constitutional:      General: He is not in acute distress.    Appearance: He is not ill-appearing.  Neck:     Vascular: No carotid bruit.  Cardiovascular:     Rate and Rhythm: Normal rate and regular rhythm.     Heart sounds: Normal heart sounds.  Pulmonary:     Effort: No respiratory distress.     Breath sounds: Rales (LLL) present. No wheezing.  Abdominal:     General: There is no distension.     Palpations: Abdomen is soft.  Musculoskeletal:     Right lower leg: Edema (2+ pitting although softer) present.     Left lower leg: Edema (2+ pitting although softer) present.   Skin:    General: Skin is warm and dry.  Neurological:     Mental Status: He is alert and oriented to person, place, and time.  Psychiatric:        Mood and Affect: Mood normal.        Thought Content: Thought content normal.     Assessment & Plan: Acute on Chronic Heart failure with reduced ejection fraction- - NYHA III - moderately fluid overloaded although symptomatically better - will send for 45m IV lasix/ 484m PO potassium again today - will get BMP/BNP today - weighing daily; reminded to call for an overnight weight gain of > 2 pounds or a weekly weight gain of > 5 pounds - weight up 2 pounds from last visit here yesterday although is constipated - on GDMT of metoprolol succinate and spironolactone  - allergic to Jardiance - BP historically soft; today is 103/68 - knows that he's been drinking too much fluids; reviewed the importance of keeping daily fluid intake to ~ 60 ounces daily - says that he's been taking furosemide 6050mM/ 25m45m; will decrease this to 25mg32m - consider adding metolazone in the future - O2 @ 2L QHS and PRN during the day; explained that he needed to wear his oxygen during the day more often than not; will message cardiology about portable oxygen as daughter says cardiology is who originally ordered his oxygen - BNP  08/09/21 4500  2.  CAD - DES to ostial LAD - ASA, plavix - saw cardiology Kathlen Mody) 09/25/21  3.  DM - A1c 09/26/21 was 7.0% - BMP 10/15/21 (home health) reviewed and showed sodium 134, potassium 3.8, creatinine 1.17 and GFR 61 - glucose at home today was 121 - saw PCP Edwina Barth) 07/11/21  4. Lymphedema - stage 2 - limited in his ability to exercise due to his shortness of breath - woody appearance but no open wounds - does not have compression socks on ; encouraged to wear them daily with removal at bedtime - consider compression boots if edema persists  5: Constipation- - was taking 5 dulcolax daily and then stopped it due  to reading it had sodium in it - now using suppositories with some relief but still feels constipated - explained that he could use 2 dulcolax daily but that he needs to f/u with PCP regarding this so that his body doesn't become dependent on suppositories to have a BM   Medication list reviewed.   Return in 4 days for recheck of symptoms.

## 2021-10-19 NOTE — Patient Instructions (Addendum)
Continue weighing daily and call for an overnight weight gain of 3 pounds or more or a weekly weight gain of more than 5 pounds.     I've sent to the pharmacy furosemide 40mg  in the morning and 40mg  in the evening (twice a day)    Drink about 60 ounces of fluid daily.

## 2021-10-20 NOTE — Progress Notes (Signed)
Patient ID: Henry Lindsey MRN: [id number]; DOB: 10-01-1933   Henry Lindsey is a 87 y.o. male with a history of CAD, ICM, HFrEF, HTN, HL, DMII, GERD, CVA, PAD, and carotid arterial disease.  Echo 08/10/21: Left Ventricle: Left ventricular ejection fraction, by estimation, is 25 to 30%. The left ventricle has severely decreased function. The left ventricle demonstrates global hypokinesis. The average left ventricular global longitudinal strain is -6.3 %. The global longitudinal strain is abnormal. The left ventricular internal  cavity size was normal in size. There is no left ventricular hypertrophy. Left ventricular diastolic parameters are consistent with Grade II diastolic dysfunction (pseudonormalization).   Right Ventricle: The right ventricular size is moderately enlarged. No increase in right ventricular wall thickness. Right ventricular systolic function is moderately reduced. There is mildly elevated pulmonary artery systolic pressure. The tricuspid  regurgitant velocity is 2.58 m/s, and with an assumed right atrial  pressure of 15 mmHg, the estimated right ventricular systolic pressure is 62.1 mmHg.   LHC 08/15/21:   Mid LM to Dist LM lesion is 30% stenosed.   Ost LAD lesion is 85% stenosed.   Prox LAD lesion is 50% stenosed.   Dist LAD lesion is 35% stenosed.   Prox Cx lesion is 50% stenosed.   Prox RCA to Dist RCA lesion is 100% stenosed.   1st Diag lesion is 80% stenosed.   1st Mrg lesion is 70% stenosed.   A drug-eluting stent was successfully placed using a STENT ONYX FRONTIER 2.75X26.   Post intervention, there is a 0% residual stenosis.   Post intervention, there is a 0% residual stenosis.   Post intervention, there is a 0% residual stenosis.   Successful Impella supported high risk PCI of the ostial LAD with atherectomy and drug-eluting stent placement.  The stent extended 1 to 2 mm into the left main coronary artery to ensure coverage of the ostium.  There was normal flow  into the  left circumflex.Moderately elevated left ventricular end-diastolic pressure at 24 mmHg at the beginning of the case.  Admitted 10/01/21 due to infection in right knee with right knee replacement. Discharged after 3 days. Admitted 10/20-10/26/22 at [facility] due to progressive dyspnea, increasing abdominal girth, lower extremity edema and transferred to [facility] for high risk PCI. Successful Impella supported high risk PCI of the ostial LAD with atherectomy and drug-eluting stent placement.   He presents today for a follow-up visit with a chief complaint of moderate shortness of breath with little exertion. He describes this as having been present for several months. He has associated fatigue, pedal edema, abdominal distention & weight gain along with this. He denies any dizziness, cough, palpitations or chest pain.   Received IV lasix twice last week with short-term improvement of symptoms. Says that he urinates "a lot" for about a day with improvement of shortness of breath but then symptoms return.   Has about 4 more weeks of IV antibiotics to treat right knee infection. Daughter asked ID if IV antibiotics could be replaced with oral due to IV being mixed in saline solution and says that she was told he needed the IV antibiotics.   Past Medical History:  Diagnosis Date   Anginal pain ([place])    Aortic atherosclerosis (HCC)    Arthritis    BPH (benign prostatic hyperplasia)    CAD (coronary artery disease)    a.) 2/22 Cath: LM 50d, LAD sev ost dz, mod-sev prox/mid dz. D1 sev dz. LCX mild prox dzs, OM2 mod-sev dz,  RCA 100p. RPL and RPDA fill via L-R collats. EF 35%. Seen by CVTS->Med mgmt. b.) R/LHC 08/14/21: 30% m-d-LM, 85% oLAD, 50% pLAD, 35% dLAD, 80% D1, 50% pLCx, 70% OM1, 100% p-dRCA -> trans to [facility]. c.) 08/15/21 Impella supported HIGH RISK PCI with atherectomy. 2.75 x 47m Onyx Frontier DES oLAD.   Carotid artery stenosis    a. 11/2010 U/S: <50% bilaterally; b. 03/2021 40-59% bilat ICA  stenoses.   COPD [place]chronic obstructive pulmonary disease) [place]    noted CT 10/05/16 former smoker quit age 87   CVA (cerebral infarction) 11/2010   a.) RIGHT thalamic lacunar   GERD (gastroesophageal reflux disease)    Heart murmur    HFrEF (heart failure with reduced ejection fraction) ([place]    a. 10/2020 Echo: EF 35%.   Hyperlipidemia    Hypertension    Ischemic cardiomyopathy    a. 10/2020 Echo: EF 35%. Nl RVSP. Mod MR. Mild TR; b. 11/2020 Cath:  CO/CI 5.05/2.49. LV gram: EF 35%.   Lower extremity cellulitis    NSTEMI (non-ST elevated myocardial infarction) ([place] 08/09/2021   a.) R/LHC 08/14/2021: 30% m-d-LM, 85% oLAD, 50% pLAD, 35% dLAD, 80% D1, 50% pLCx, 70% OM1, 100% p-dRCA -> transfer to [facility]. b.) 08/15/2021 Impella supported HIGH RISK PCI with atherectomy. 2.75 x 263mOnyx Frontier DES to [facility]  Peripheral vascular disease in diabetes mellitus ([place]   a. 06/2012 95% occlusion s/p PTA R SFA (Dr. DeLucky Cowboy b. 03/2021 ABIs: R 1.02, L 0.96.   Pneumonia    [facility] mellitus [facility]   Past Surgical History:  Procedure Laterality Date   ABDOMINAL AORTOGRAM N/A 08/14/2021   Procedure: ABDOMINAL AORTOGRAM;  Surgeon: EnNelva BushMD;  [facility]: [facility];  Service: Cardiovascular;  Laterality: N/A;   CHOLECYSTECTOMY N/A 11/13/2016   Procedure: LAPAROSCOPIC CHOLECYSTECTOMY WITH INTRAOPERATIVE CHOLANGIOGRAM;  Surgeon: JoOlean ReeMD;  Location: [facility] ORS;  Service: General;  Laterality: N/A;   COLONOSCOPY WITH PROPOFOL N/A 02/10/2018   Procedure: COLONOSCOPY WITH PROPOFOL;  Surgeon: WoLucilla LameMD;  Location: [facility] ENDOSCOPY;  Service: Endoscopy;  Laterality: N/A;   CORONARY ATHERECTOMY N/A 08/15/2021   Procedure: CORONARY ATHERECTOMY;  Surgeon: ArWellington HampshireMD;  [facility];  Service: Cardiovascular;  Laterality: N/A;   CORONARY STENT INTERVENTION W/IMPELLA N/A 08/15/2021   Procedure: Coronary Stent Intervention w/Impella;   Surgeon: ArWellington HampshireMD;  [facility];  Service: Cardiovascular;  Laterality: N/A;   ERCP N/A 11/17/2016   Procedure: ENDOSCOPIC RETROGRADE CHOLANGIOPANCREATOGRAPHY (ERCP);  Surgeon: JoIrene ShipperMD;  [facility];  Service: Endoscopy;  Laterality: N/A;   ERCP N/A 02/04/2017   Procedure: ENDOSCOPIC RETROGRADE CHOLANGIOPANCREATOGRAPHY (ERCP) Stent removal;  Surgeon: DaLucilla LameMD;  Location: [facility] ENDOSCOPY;  Service: Endoscopy;  Laterality: N/A;   IR GENERIC HISTORICAL  10/06/2016   IR PERC CHOLECYSTOSTOMY 10/06/2016 GlAletta EdouardMD [facility]-INTERV RAD   JOINT REPLACEMENT Left 2014   left knee   RIGHT/LEFT HEART CATH AND CORONARY ANGIOGRAPHY N/A 11/24/2020   Procedure: RIGHT/LEFT HEART CATH AND CORONARY ANGIOGRAPHY;  Surgeon: GoMinna MerrittsMD;  [facility];  Service: Cardiovascular;  Laterality: N/A;   RIGHT/LEFT HEART CATH AND CORONARY ANGIOGRAPHY N/A 08/14/2021   Procedure: RIGHT/LEFT HEART CATH AND CORONARY ANGIOGRAPHY;  Surgeon: EnNelva BushMD;  [facility];  Service: Cardiovascular;  Laterality: N/A;   TOTAL KNEE ARTHROPLASTY Right 08/08/2020   Procedure: TOTAL KNEE ARTHROPLASTY;  Surgeon: Lovell Sheehan, MD;  Location: [facility] ORS;  Service: Orthopedics;  Laterality: Right;   TOTAL KNEE REVISION Right 10/01/2021   Procedure: IRRIGATION AND DEBRIDMENT RIGHT KNEE WITH POLY EXCHANGE;  Surgeon: Lovell Sheehan, MD;  Location: [facility] ORS;  Service: Orthopedics;  Laterality: Right;   UPPER GASTROINTESTINAL ENDOSCOPY     WRIST SURGERY     Family History  Problem Relation Age of Onset   Diabetes Mother    Social History   Tobacco Use   Smoking status: Former    Packs/day: 0.50    Years: 33.00    Pack years: 16.50    Types: Cigarettes    Quit date: 04/21/1968    Years since quitting: 53.5   Smokeless tobacco: Never  Substance Use Topics   Alcohol use: No   Allergies  Allergen Reactions   Jardiance  [Empagliflozin]     Rash all over and lips turn purple.    Metformin And Related Diarrhea   Trazodone Other (See Comments)    Extreme hallucinations/ psychosis    Torsemide Rash    "Skin reaction"   Prior to Admission medications   Medication Sig Start Date End Date Taking? Authorizing Provider  albuterol (VENTOLIN HFA) 108 (90 Base) MCG/ACT inhaler Inhale 2 puffs into the lungs every 6 (six) hours as needed for shortness of breath or wheezing. 10/29/20  Yes [provider]  ascorbic acid (VITAMIN C) 500 MG tablet Take 500 mg by mouth daily.    Yes [provider]  aspirin EC 81 MG tablet Take 1 tablet (81 mg total) by mouth daily. Swallow whole. 01/31/21  Yes Minna Merritts, MD  atorvastatin (LIPITOR) 40 MG tablet Take 1 tablet (40 mg total) by mouth daily. Patient taking differently: Take 40 mg by mouth in the morning. 12/08/20  Yes Gollan, Kathlene November, MD  Blood Glucose Monitoring Suppl (ONE TOUCH ULTRA SYSTEM KIT) w/Device KIT 1 kit by Does not apply route once. Use DX code E11.59 10/18/15  Yes [name], MD  ceFAZolin (ANCEF) IVPB Inject 2 g into the vein every 8 (eight) hours. Indication:  MSSA prosthetic joint infection of knee First Dose: Yes Last Day of Therapy:  11/12/2021 Labs - Once weekly on MONDAYS:  CBC/D, CMP, ESR and CRP Please pull PIC at completion of IV antibiotics Fax weekly labs to [phone] Method of administration: IV Push Method of administration may be changed at the discretion of home infusion pharmacist based upon assessment of the patient and/or caregiver's ability to self-administer the medication ordered. 10/04/21 11/12/21 Yes Carlynn Spry, PA-C  cholecalciferol (VITAMIN D) 25 MCG (1000 UNIT) tablet Take 1,000 Units by mouth daily.   Yes [provider]  clopidogrel (PLAVIX) 75 MG tablet TAKE 1 TABLET(75 MG) BY MOUTH DAILY WITH BREAKFAST 09/11/21  Yes Gollan, Kathlene November, MD  diclofenac Sodium (VOLTAREN) 1 % GEL Apply 2 g  topically 2 (two) times daily as needed (pain).   Yes [provider]  docusate sodium (COLACE) 100 MG capsule Take 1 capsule (100 mg total) by mouth 2 (two) times daily. 10/04/21  Yes Carlynn Spry, PA-C  furosemide (LASIX) 40 MG tablet Take 1 tablet (40 mg total) by mouth 2 (two) times daily. 10/19/21 01/17/22 Yes Ciria Bernardini, Otila Kluver A, FNP  gabapentin (NEURONTIN) 400 MG capsule Take 400 mg by mouth 2 (two) times daily.   Yes [provider]  glipiZIDE (GLUCOTROL) 10 MG tablet Take 10 mg by mouth daily before breakfast.   Yes [provider]  INS SYRINGE/NEEDLE 1CC/28G (B-D INSULIN SYRINGE 1CC/28G) 28G X 1/2" 1 ML MISC USE TO ADMINISTER INSULIN DAILY 02/13/16  Yes [name], MD  insulin aspart protamine- aspart (NOVOLOG MIX 70/30) (70-30) 100 UNIT/ML injection Inject 0.15 mLs (15 Units total) into the skin 2 (two) times daily with a meal. 08/14/21  Yes Fritzi Mandes, MD  latanoprost (XALATAN) 0.005 % ophthalmic solution Place 1 drop into both eyes at bedtime. 11/01/15  Yes [provider]  metoprolol succinate (TOPROL-XL) 50 MG 24 hr tablet Take 1 tablet (50 mg total) by mouth daily. Take with or immediately following a meal. 08/21/21  Yes Bhagat, Bhavinkumar, PA  Multiple Vitamin (MULTIVITAMIN WITH MINERALS) TABS tablet Take 1 tablet by mouth in the morning.   Yes [provider]  nitroGLYCERIN (NITROSTAT) 0.4 MG SL tablet Place 1 tablet (0.4 mg total) under the tongue every 5 (five) minutes as needed for chest pain. MAXIMUM 3 TABLETS 12/08/20  Yes Gollan, Kathlene November, MD  ONE TOUCH ULTRA TEST test strip USE 1 STRIP TO TEST THREE TIMES DAILY AS DIRECTED 08/31/18  Yes [name], MD  [facility] DELICA LANCETS 00Q MISC Use three times daily to check blood sugar. 10/18/15  Yes [name], MD  oxyCODONE (OXY IR/ROXICODONE) 5 MG immediate release tablet Take 5 mg by mouth every 4 (four) hours as needed for severe pain.   Yes [provider]   pantoprazole (PROTONIX) 40 MG tablet Take 40 mg by mouth daily.   Yes [provider]  spironolactone (ALDACTONE) 25 MG tablet Take 0.5 tablets (12.5 mg total) by mouth daily. 08/21/21  Yes Bhagat, Bhavinkumar, PA  tamsulosin (FLOMAX) 0.4 MG CAPS capsule Take 0.4 mg by mouth daily after breakfast.   Yes [provider]  timolol (TIMOPTIC) 0.5 % ophthalmic solution Place 1 drop into both eyes in the morning. 12/19/17  Yes [provider]  vitamin B-12 (CYANOCOBALAMIN) 500 MCG tablet Take 500 mcg by mouth daily.   Yes [provider]  vitamin E 180 MG (400 UNITS) capsule Take 400 Units by mouth daily.   Yes [provider]  cyclobenzaprine (FLEXERIL) 5 MG tablet Take 5-10 mg by mouth daily as needed for muscle spasms. Patient not taking: Reported on 10/18/2021 07/31/21   [provider]  HYDROcodone-acetaminophen (NORCO/VICODIN) 5-325 MG tablet Take 1 tablet by mouth every 6 (six) hours as needed (pain.). Patient not taking: Reported on 10/18/2021 10/27/20   [provider]  HYDROcodone-acetaminophen (NORCO/VICODIN) 5-325 MG tablet Take 1 tablet by mouth every 4 (four) hours as needed for moderate pain (pain score 4-6). Patient not taking: Reported on 10/18/2021 10/04/21   Carlynn Spry, PA-C  rifampin (RIFADIN) 300 MG capsule Take 1 capsule (300 mg total) by mouth 2 (two) times daily. Patient not taking: Reported on 10/23/2021 10/04/21 11/12/21  Carlynn Spry, PA-C  sacubitril-valsartan (ENTRESTO) 24-26 MG Take 1 tablet by mouth 2 (two) times daily. Patient not taking: Reported on 10/05/2021 09/25/21 11/24/21  Kathlen Mody, Cadence H, PA-C   Review of Systems  Constitutional:  Positive for malaise/fatigue.  HENT: Negative.    Eyes: Negative.   Respiratory:  Positive for shortness of breath. Negative for cough and wheezing.   Cardiovascular:  Positive for leg swelling (worsening). Negative for chest pain, palpitations and orthopnea.   Gastrointestinal:  Positive for constipation.       Abdominal distention  Genitourinary: Negative.   Musculoskeletal: Negative.  Negative for falls.  Skin: Negative.   Neurological:  Negative for dizziness, weakness and headaches.  Endo/Heme/Allergies: Negative.   Psychiatric/Behavioral:  The patient is not nervous/anxious and does not have insomnia.     Vitals:   10/23/21 1317  BP: 121/64  Pulse: 84  Resp: 16  SpO2: 96%  Weight: 198 lb 6 oz (90 kg)  Height: '5\' 11"'  (1.803 m)   Wt Readings from Last 3 Encounters:  10/23/21 198 lb 6 oz (90 kg)  10/19/21 195 lb 8 oz (88.7 kg)  10/18/21 193 lb 8 oz (87.8 kg)   Lab Results  Component Value Date   CREATININE 1.11 10/19/2021   CREATININE 1.15 10/02/2021   CREATININE 1.07 08/20/2021   Physical Exam Vitals and nursing note reviewed. Exam conducted with a chaperone present (daughter).  Constitutional:      General: He is not in acute distress.    Appearance: He is not ill-appearing.  Neck:     Vascular: No carotid bruit.  Cardiovascular:     Rate and Rhythm: Normal rate and regular rhythm.     Heart sounds: Normal heart sounds.  Pulmonary:     Effort: No respiratory distress.     Breath sounds: No wheezing or rales.  Abdominal:     General: There is distension.     Palpations: Abdomen is soft.  Musculoskeletal:     Right lower leg: Edema (2+ pitting although softer) present.     Left lower leg: Edema (2+ pitting although softer) present.  Skin:    General: Skin is warm and dry.  Neurological:     Mental Status: He is alert and oriented to person, place, and time.  Psychiatric:        Mood and Affect: Mood normal.        Thought Content: Thought content normal.     Assessment & Plan: Acute on Chronic Heart failure with reduced ejection fraction- - NYHA III - moderately fluid overloaded with abdominal distention, pedal edema, weight gain and SOB - weighing daily; reminded to call for an overnight weight gain of > 2  pounds or a weekly weight gain of > 5 pounds - weight up ~ 3 pounds from last visit here 4 days ago - on GDMT of metoprolol succinate and spironolactone  - allergic to Jardiance - BP historically soft; today is 121/64 - since IV lasix gives him short-term relief, will send back for 88m IV lasix/ 256m PO potassium today - labs were drawn by home health yesterday so [place] will also begin metolazone 2.29m17maily for the next 3 days starting tomorrow. To take it 1/2 hour before morning furosemide. Will also given 68m64motassium daily for the next days  - explained that if this doesn't help, he may need to be admitted for lasix drip - O2 @ 2L QHS and PRN during the day; explained that he needed to wear his oxygen during the day more often than not; will message cardiology about portable oxygen as daughter says cardiology is who originally ordered his oxygen - BNP 09/3021 was 2372.6  2.  CAD- - DES to ostial LAD - ASA, plavix - saw cardiology (FurKathlen Mody/6/22  3.  DM- - A1c 09/26/21 was 7.0% - BMP 10/19/21 reviewed and showed sodium 133, potassium 4.3, creatinine 1.11 and GFR >60 - glucose at home today was 130; did have episode of hypoglycemia a few days ago because he had not eaten in a few hours  - saw PCP (JohEdwina Barth21/22  4.  Lymphedema - stage 2 - limited in his ability to exercise due to his shortness of breath - woody appearance but no open wounds - does not have compression socks on ; encouraged to wear them daily with removal at bedtime - consider compression boots if edema persists  5: Constipation- - taking 2 dulcolax daily with improvement of constipation   Medication list reviewed.   Return in 3 days, sooner if needed.

## 2021-10-23 ENCOUNTER — Encounter: Payer: Self-pay | Admitting: Family

## 2021-10-23 ENCOUNTER — Other Ambulatory Visit: Payer: Self-pay | Admitting: Family

## 2021-10-23 ENCOUNTER — Other Ambulatory Visit: Payer: Self-pay

## 2021-10-23 ENCOUNTER — Ambulatory Visit
Admission: RE | Admit: 2021-10-23 | Discharge: 2021-10-23 | Disposition: A | Discharge status: Home / Self Care | Payer: Medicare Other | Source: Ambulatory Visit | Attending: Family | Admitting: Family

## 2021-10-23 ENCOUNTER — Ambulatory Visit (HOSPITAL_BASED_OUTPATIENT_CLINIC_OR_DEPARTMENT_OTHER): Payer: Medicare Other | Admitting: Family

## 2021-10-23 VITALS — BP 121/64 | HR 84 | Resp 16 | Ht 71.0 in | Wt 198.4 lb

## 2021-10-23 DIAGNOSIS — I890 Lymphedema, not elsewhere classified: Secondary | ICD-10-CM | POA: Insufficient documentation

## 2021-10-23 DIAGNOSIS — I5023 Acute on chronic systolic (congestive) heart failure: Secondary | ICD-10-CM | POA: Insufficient documentation

## 2021-10-23 DIAGNOSIS — I110 Hypertensive heart disease with heart failure: Secondary | ICD-10-CM | POA: Insufficient documentation

## 2021-10-23 DIAGNOSIS — E1151 Type 2 diabetes mellitus with diabetic peripheral angiopathy without gangrene: Secondary | ICD-10-CM | POA: Insufficient documentation

## 2021-10-23 DIAGNOSIS — E1122 Type 2 diabetes mellitus with diabetic chronic kidney disease: Secondary | ICD-10-CM

## 2021-10-23 DIAGNOSIS — I255 Ischemic cardiomyopathy: Secondary | ICD-10-CM | POA: Insufficient documentation

## 2021-10-23 DIAGNOSIS — Z8673 Personal history of transient ischemic attack (TIA), and cerebral infarction without residual deficits: Secondary | ICD-10-CM | POA: Insufficient documentation

## 2021-10-23 DIAGNOSIS — Z87891 Personal history of nicotine dependence: Secondary | ICD-10-CM | POA: Insufficient documentation

## 2021-10-23 DIAGNOSIS — I2510 Atherosclerotic heart disease of native coronary artery without angina pectoris: Secondary | ICD-10-CM | POA: Insufficient documentation

## 2021-10-23 DIAGNOSIS — K219 Gastro-esophageal reflux disease without esophagitis: Secondary | ICD-10-CM | POA: Insufficient documentation

## 2021-10-23 DIAGNOSIS — K5900 Constipation, unspecified: Secondary | ICD-10-CM | POA: Insufficient documentation

## 2021-10-23 DIAGNOSIS — E785 Hyperlipidemia, unspecified: Secondary | ICD-10-CM | POA: Insufficient documentation

## 2021-10-23 DIAGNOSIS — I25118 Atherosclerotic heart disease of native coronary artery with other forms of angina pectoris: Secondary | ICD-10-CM

## 2021-10-23 DIAGNOSIS — N181 Chronic kidney disease, stage 1: Secondary | ICD-10-CM

## 2021-10-23 DIAGNOSIS — Z794 Long term (current) use of insulin: Secondary | ICD-10-CM

## 2021-10-23 MED ORDER — POTASSIUM CHLORIDE CRYS ER 20 MEQ PO TBCR
EXTENDED_RELEASE_TABLET | ORAL | Status: AC
  Administered 2021-10-23: 40 meq via ORAL
  Filled 2021-10-23: qty 2

## 2021-10-23 MED ORDER — METOLAZONE 2.5 MG PO TABS: 2.5000 mg | ORAL_TABLET | Freq: Every day | ORAL | 0 refills | Status: DC

## 2021-10-23 MED ORDER — POTASSIUM CHLORIDE CRYS ER 20 MEQ PO TBCR: 20.0000 meq | EXTENDED_RELEASE_TABLET | Freq: Every day | ORAL | 0 refills | Status: DC

## 2021-10-23 MED ORDER — FUROSEMIDE 10 MG/ML IJ SOLN: 80.0000 mg | Freq: Once | INTRAMUSCULAR | Status: AC

## 2021-10-23 MED ORDER — FUROSEMIDE 10 MG/ML IJ SOLN
INTRAMUSCULAR | Status: AC
  Administered 2021-10-23: 80 mg via INTRAVENOUS
  Filled 2021-10-23: qty 8

## 2021-10-23 MED ORDER — POTASSIUM CHLORIDE CRYS ER 20 MEQ PO TBCR: 40.0000 meq | EXTENDED_RELEASE_TABLET | Freq: Once | ORAL | Status: AC

## 2021-10-23 NOTE — Patient Instructions (Addendum)
Continue weighing daily and call for an overnight weight gain of 3 pounds or more or a weekly weight gain of more than 5 pounds.     Take booster fluid pill (metolazone) every morning for the next 3 days. You will take it 1/2 hour before you take your morning furosemide. For these next 3 days, you will also take 1 potassium tablet.

## 2021-10-24 ENCOUNTER — Ambulatory Visit: Payer: Medicare Other | Admitting: Family

## 2021-10-26 ENCOUNTER — Encounter: Payer: Self-pay | Admitting: Family

## 2021-10-26 ENCOUNTER — Other Ambulatory Visit: Payer: Self-pay

## 2021-10-26 ENCOUNTER — Telehealth: Payer: Self-pay | Admitting: Family

## 2021-10-26 ENCOUNTER — Telehealth: Payer: Self-pay | Admitting: Cardiovascular Disease

## 2021-10-26 ENCOUNTER — Ambulatory Visit: Payer: Medicare Other | Attending: Family | Admitting: Family

## 2021-10-26 VITALS — BP 114/58 | HR 79 | Resp 18 | Ht 71.0 in | Wt 194.0 lb

## 2021-10-26 DIAGNOSIS — N181 Chronic kidney disease, stage 1: Secondary | ICD-10-CM

## 2021-10-26 DIAGNOSIS — E1122 Type 2 diabetes mellitus with diabetic chronic kidney disease: Secondary | ICD-10-CM | POA: Diagnosis not present

## 2021-10-26 DIAGNOSIS — I110 Hypertensive heart disease with heart failure: Secondary | ICD-10-CM | POA: Diagnosis not present

## 2021-10-26 DIAGNOSIS — Z794 Long term (current) use of insulin: Secondary | ICD-10-CM

## 2021-10-26 DIAGNOSIS — E1151 Type 2 diabetes mellitus with diabetic peripheral angiopathy without gangrene: Secondary | ICD-10-CM | POA: Diagnosis not present

## 2021-10-26 DIAGNOSIS — I2510 Atherosclerotic heart disease of native coronary artery without angina pectoris: Secondary | ICD-10-CM | POA: Diagnosis not present

## 2021-10-26 DIAGNOSIS — K219 Gastro-esophageal reflux disease without esophagitis: Secondary | ICD-10-CM | POA: Diagnosis not present

## 2021-10-26 DIAGNOSIS — I890 Lymphedema, not elsewhere classified: Secondary | ICD-10-CM | POA: Diagnosis not present

## 2021-10-26 DIAGNOSIS — I5022 Chronic systolic (congestive) heart failure: Secondary | ICD-10-CM

## 2021-10-26 DIAGNOSIS — Z8673 Personal history of transient ischemic attack (TIA), and cerebral infarction without residual deficits: Secondary | ICD-10-CM | POA: Diagnosis not present

## 2021-10-26 DIAGNOSIS — I25118 Atherosclerotic heart disease of native coronary artery with other forms of angina pectoris: Secondary | ICD-10-CM

## 2021-10-26 DIAGNOSIS — Z87891 Personal history of nicotine dependence: Secondary | ICD-10-CM | POA: Insufficient documentation

## 2021-10-26 LAB — BASIC METABOLIC PANEL
Anion gap: 7 (ref 5–15)
BUN: 43 mg/dL — ABNORMAL HIGH (ref 8–23)
CO2: 31 mmol/L (ref 22–32)
Calcium: 8.7 mg/dL — ABNORMAL LOW (ref 8.9–10.3)
Chloride: 92 mmol/L — ABNORMAL LOW (ref 98–111)
Creatinine, Ser: 1.1 mg/dL (ref 0.61–1.24)
GFR, Estimated: >60 mL/min (ref >60)
Glucose, Bld: 180 mg/dL — ABNORMAL HIGH (ref 70–99)
Potassium: 3.9 mmol/L (ref 3.5–5.1)
Sodium: 130 mmol/L — ABNORMAL LOW (ref 135–145)

## 2021-10-26 MED ORDER — METOLAZONE 2.5 MG PO TABS: 2.5000 mg | ORAL_TABLET | ORAL | 0 refills | Status: DC

## 2021-10-26 MED ORDER — POTASSIUM CHLORIDE CRYS ER 20 MEQ PO TBCR: 20.0000 meq | EXTENDED_RELEASE_TABLET | ORAL | 0 refills | Status: DC

## 2021-10-26 NOTE — Progress Notes (Signed)
Patient ID: Henry Lindsey MRN: [id number]; DOB: [birth year]   Henry Lindsey is a 88 y.o. male with a history of CAD, ICM, HFrEF, HTN, HL, DMII, GERD, CVA, PAD, and carotid arterial disease.  Echo 08/10/21: Left Ventricle: Left ventricular ejection fraction, by estimation, is 25 to 30%. The left ventricle has severely decreased function. The left ventricle demonstrates global hypokinesis. The average left ventricular global longitudinal strain is -6.3 %. The global longitudinal strain is abnormal. The left ventricular internal  cavity size was normal in size. There is no left ventricular hypertrophy. Left ventricular diastolic parameters are consistent with Grade II diastolic dysfunction (pseudonormalization).   Right Ventricle: The right ventricular size is moderately enlarged. No increase in right ventricular wall thickness. Right ventricular systolic function is moderately reduced. There is mildly elevated pulmonary artery systolic pressure. The tricuspid  regurgitant velocity is 2.58 m/s, and with an assumed right atrial  pressure of 15 mmHg, the estimated right ventricular systolic pressure is 31.5 mmHg.   LHC 08/15/21:   Mid LM to Dist LM lesion is 30% stenosed.   Ost LAD lesion is 85% stenosed.   Prox LAD lesion is 50% stenosed.   Dist LAD lesion is 35% stenosed.   Prox Cx lesion is 50% stenosed.   Prox RCA to Dist RCA lesion is 100% stenosed.   1st Diag lesion is 80% stenosed.   1st Mrg lesion is 70% stenosed.   A drug-eluting stent was successfully placed using a STENT ONYX FRONTIER 2.75X26.   Post intervention, there is a 0% residual stenosis.   Post intervention, there is a 0% residual stenosis.   Post intervention, there is a 0% residual stenosis.   Successful Impella supported high risk PCI of the ostial LAD with atherectomy and drug-eluting stent placement.  The stent extended 1 to 2 mm into the left main coronary artery to ensure coverage of the ostium.  There was normal flow  into the  left circumflex.Moderately elevated left ventricular end-diastolic pressure at 24 mmHg at the beginning of the case.  Admitted 10/01/21 due to infection in right knee with right knee replacement. Discharged after 3 days. Admitted 10/20-10/26/22 at [facility] due to progressive dyspnea, increasing abdominal girth, lower extremity edema and transferred to [facility] for high risk PCI. Successful Impella supported high risk PCI of the ostial LAD with atherectomy and drug-eluting stent placement.   He presents today for a follow-up visit with a chief complaint of moderate fatigue upon minimal exertion. He describes this as chronic in nature. He has associated pedal edema, shortness of breath and nausea today. He denies any dizziness, weakness, difficulty sleeping, cough, palpitations, chest pain or weight gain.   Received IV lasix along with 3 days of metolazone & says that he felt better after taking the metolazone versus the IV lasix.   Has about 2 more weeks of IV antibiotics to treat right knee infection.   Past Medical History:  Diagnosis Date   Anginal pain ([place])    Aortic atherosclerosis (HCC)    Arthritis    BPH (benign prostatic hyperplasia)    CAD (coronary artery disease)    a.) 2/22 Cath: LM 50d, LAD sev ost dz, mod-sev prox/mid dz. D1 sev dz. LCX mild prox dzs, OM2 mod-sev dz, RCA 100p. RPL and RPDA fill via L-R collats. EF 35%. Seen by CVTS->Med mgmt. b.) R/LHC 08/14/21: 30% m-d-LM, 85% oLAD, 50% pLAD, 35% dLAD, 80% D1, 50% pLCx, 70% OM1, 100% p-dRCA -> trans to [facility]. c.) 08/15/21 Impella  supported HIGH RISK PCI with atherectomy. 2.75 x 89m Onyx Frontier DES oLAD.   Carotid artery stenosis    a. 11/2010 U/S: <50% bilaterally; b. 03/2021 40-59% bilat ICA stenoses.   COPD [place]chronic obstructive pulmonary disease) [place]    noted CT 10/05/16 former smoker quit age 88   CVA (cerebral infarction) 11/2010   a.) RIGHT thalamic lacunar   GERD (gastroesophageal reflux disease)    Heart murmur     HFrEF (heart failure with reduced ejection fraction) ([place]    a. 10/2020 Echo: EF 35%.   Hyperlipidemia    Hypertension    Ischemic cardiomyopathy    a. 10/2020 Echo: EF 35%. Nl RVSP. Mod MR. Mild TR; b. 11/2020 Cath:  CO/CI 5.05/2.49. LV gram: EF 35%.   Lower extremity cellulitis    NSTEMI (non-ST elevated myocardial infarction) ([place] 08/09/2021   a.) R/LHC 08/14/2021: 30% m-d-LM, 85% oLAD, 50% pLAD, 35% dLAD, 80% D1, 50% pLCx, 70% OM1, 100% p-dRCA -> transfer to [facility]. b.) 08/15/2021 Impella supported HIGH RISK PCI with atherectomy. 2.75 x 273mOnyx Frontier DES to [facility]  Peripheral vascular disease in diabetes mellitus ([place]   a. 06/2012 95% occlusion s/p PTA R SFA (Dr. DeLucky Cowboy b. 03/2021 ABIs: R 1.02, L 0.96.   Pneumonia    Type 2 diabetes mellitus [facility] with insulin ([facility]   Past Surgical History:  Procedure Laterality Date   ABDOMINAL AORTOGRAM N/A 08/14/2021   Procedure: ABDOMINAL AORTOGRAM;  Surgeon: EnNelva BushMD;  [facility];  Service: Cardiovascular;  Laterality: N/A;   CHOLECYSTECTOMY N/A 11/13/2016   Procedure: LAPAROSCOPIC CHOLECYSTECTOMY WITH INTRAOPERATIVE CHOLANGIOGRAM;  Surgeon: JoOlean ReeMD;  Location: [facility];  Service: General;  Laterality: N/A;   COLONOSCOPY WITH PROPOFOL N/A 02/10/2018   Procedure: COLONOSCOPY WITH PROPOFOL;  Surgeon: WoLucilla LameMD;  Location: [facility];  Service: Endoscopy;  Laterality: N/A;   CORONARY ATHERECTOMY N/A 08/15/2021   Procedure: CORONARY ATHERECTOMY;  Surgeon: ArWellington HampshireMD;  [facility];  Service: Cardiovascular;  Laterality: N/A;   CORONARY STENT INTERVENTION W/IMPELLA N/A 08/15/2021   Procedure: Coronary Stent Intervention w/Impella;  Surgeon: ArWellington HampshireMD;  [facility];  Service: Cardiovascular;  Laterality: N/A;   ERCP N/A 11/17/2016   Procedure: ENDOSCOPIC RETROGRADE CHOLANGIOPANCREATOGRAPHY (ERCP);  Surgeon: JoIrene ShipperMD;  [facility];  Service: Endoscopy;  Laterality: N/A;   ERCP N/A 02/04/2017   Procedure: ENDOSCOPIC RETROGRADE CHOLANGIOPANCREATOGRAPHY (ERCP) Stent removal;  Surgeon: DaLucilla LameMD;  Location: [facility];  Service: Endoscopy;  Laterality: N/A;   IR GENERIC HISTORICAL  10/06/2016   IR PERC CHOLECYSTOSTOMY 10/06/2016 GlAletta EdouardMD [facility]-INTERV RAD   JOINT REPLACEMENT Left 2014   left knee   RIGHT/LEFT HEART CATH AND CORONARY ANGIOGRAPHY N/A 11/24/2020   Procedure: RIGHT/LEFT HEART CATH AND CORONARY ANGIOGRAPHY;  Surgeon: GoMinna MerrittsMD;  [place];  Service: Cardiovascular;  Laterality: N/A;   RIGHT/LEFT HEART CATH AND CORONARY ANGIOGRAPHY N/A 08/14/2021   Procedure: RIGHT/LEFT HEART CATH AND CORONARY ANGIOGRAPHY;  Surgeon: EnNelva BushMD;  [facility];  Service: Cardiovascular;  Laterality: N/A;   TOTAL KNEE ARTHROPLASTY Right 08/08/2020   Procedure: TOTAL KNEE ARTHROPLASTY;  Surgeon: BoLovell SheehanMD;  Location: [facility];  Service: Orthopedics;  Laterality: Right;   TOTAL KNEE REVISION Right 10/01/2021   Procedure: IRRIGATION AND DEBRIDMENT RIGHT KNEE WITH POLY EXCHANGE;  Surgeon: BoLovell SheehanMD;  Location: [facility];  Service: Orthopedics;  Laterality: Right;   UPPER GASTROINTESTINAL ENDOSCOPY     WRIST SURGERY     Family History  Problem Relation Age of Onset   Diabetes Mother    Social History   Tobacco Use   Smoking status: Former    Packs/day: 0.50    Years: 33.00    Pack years: 16.50    Types: Cigarettes    Quit date: 04/21/1968    Years since quitting: 53.5   Smokeless tobacco: Never  Substance Use Topics   Alcohol use: No   Allergies  Allergen Reactions   Jardiance [Empagliflozin]     Rash all over and lips turn purple.    Metformin And Related Diarrhea   Trazodone Other (See Comments)    Extreme hallucinations/ psychosis    Torsemide Rash    "Skin reaction"   Prior to Admission medications   Medication  Sig Start Date End Date Taking? Authorizing Provider  albuterol (VENTOLIN HFA) 108 (90 Base) MCG/ACT inhaler Inhale 2 puffs into the lungs every 6 (six) hours as needed for shortness of breath or wheezing. 10/29/20  Yes [provider]  ascorbic acid (VITAMIN C) 500 MG tablet Take 500 mg by mouth daily.    Yes [provider]  aspirin EC 81 MG tablet Take 1 tablet (81 mg total) by mouth daily. Swallow whole. 01/31/21  Yes Minna Merritts, MD  atorvastatin (LIPITOR) 40 MG tablet Take 1 tablet (40 mg total) by mouth daily. Patient taking differently: Take 40 mg by mouth in the morning. 12/08/20  Yes Gollan, Kathlene November, MD  Blood Glucose Monitoring Suppl (ONE TOUCH ULTRA SYSTEM KIT) w/Device KIT 1 kit by Does not apply route once. Use DX code E11.59 10/18/15  Yes [name], MD  ceFAZolin (ANCEF) IVPB Inject 2 g into the vein every 8 (eight) hours. Indication:  MSSA prosthetic joint infection of knee First Dose: Yes Last Day of Therapy:  11/12/2021 Labs - Once weekly on MONDAYS:  CBC/D, CMP, ESR and CRP Please pull PIC at completion of IV antibiotics Fax weekly labs to [phone] Method of administration: IV Push Method of administration may be changed at the discretion of home infusion pharmacist based upon assessment of the patient and/or caregiver's ability to self-administer the medication ordered. 10/04/21 11/12/21 Yes Carlynn Spry, PA-C  cholecalciferol (VITAMIN D) 25 MCG (1000 UNIT) tablet Take 1,000 Units by mouth daily.   Yes [provider]  clopidogrel (PLAVIX) 75 MG tablet TAKE 1 TABLET(75 MG) BY MOUTH DAILY WITH BREAKFAST 09/11/21  Yes Gollan, Kathlene November, MD  cyclobenzaprine (FLEXERIL) 5 MG tablet Take 5-10 mg by mouth daily as needed for muscle spasms. 07/31/21  Yes [provider]  diclofenac Sodium (VOLTAREN) 1 % GEL Apply 2 g topically 2 (two) times daily as needed (pain).   Yes [provider]  docusate sodium (COLACE) 100 MG capsule  Take 1 capsule (100 mg total) by mouth 2 (two) times daily. 10/04/21  Yes Carlynn Spry, PA-C  furosemide (LASIX) 40 MG tablet Take 1 tablet (40 mg total) by mouth 2 (two) times daily. 10/19/21 01/17/22 Yes Everlena Mackley, Otila Kluver A, FNP  gabapentin (NEURONTIN) 400 MG capsule Take 400 mg by mouth 2 (two) times daily.   Yes [provider]  glipiZIDE (GLUCOTROL) 10 MG tablet Take 10 mg by mouth daily before breakfast.   Yes [provider]  INS SYRINGE/NEEDLE 1CC/28G (B-D INSULIN SYRINGE 1CC/28G) 28G X 1/2" 1 ML MISC USE  TO ADMINISTER INSULIN DAILY 02/13/16  Yes [name], MD  insulin aspart protamine- aspart (NOVOLOG MIX 70/30) (70-30) 100 UNIT/ML injection Inject 0.15 mLs (15 Units total) into the skin 2 (two) times daily with a meal. 08/14/21  Yes Fritzi Mandes, MD  latanoprost (XALATAN) 0.005 % ophthalmic solution Place 1 drop into both eyes at bedtime. 11/01/15  Yes [provider]  metolazone (ZAROXOLYN) 2.5 MG tablet Take 1 tablet (2.5 mg total) by mouth daily. Take this 1/2 hour before furosemide 10/23/21 01/21/22 Yes Atara Paterson A, FNP  metoprolol succinate (TOPROL-XL) 50 MG 24 hr tablet Take 1 tablet (50 mg total) by mouth daily. Take with or immediately following a meal. 08/21/21  Yes Bhagat, Bhavinkumar, PA  Multiple Vitamin (MULTIVITAMIN WITH MINERALS) TABS tablet Take 1 tablet by mouth in the morning.   Yes [provider]  nitroGLYCERIN (NITROSTAT) 0.4 MG SL tablet Place 1 tablet (0.4 mg total) under the tongue every 5 (five) minutes as needed for chest pain. MAXIMUM 3 TABLETS 12/08/20  Yes Gollan, Kathlene November, MD  ONE TOUCH ULTRA TEST test strip USE 1 STRIP TO TEST THREE TIMES DAILY AS DIRECTED 08/31/18  Yes [name], MD  [facility] DELICA LANCETS 32T MISC Use three times daily to check blood sugar. 10/18/15  Yes [name], MD  oxyCODONE (OXY IR/ROXICODONE) 5 MG immediate release tablet Take 5 mg by mouth every 4 (four) hours as needed for severe pain.    Yes [provider]  pantoprazole (PROTONIX) 40 MG tablet Take 40 mg by mouth daily.   Yes [provider]  potassium chloride (KLOR-CON) 10 MEQ tablet Take 10 mEq by mouth daily. 10/03/21  Yes [provider]  potassium chloride SA (KLOR-CON M) 20 MEQ tablet Take 1 tablet (20 mEq total) by mouth daily. 10/23/21  Yes Darylene Price A, FNP  tamsulosin (FLOMAX) 0.4 MG CAPS capsule Take 0.4 mg by mouth daily after breakfast.   Yes [provider]  timolol (TIMOPTIC) 0.5 % ophthalmic solution Place 1 drop into both eyes in the morning. 12/19/17  Yes [provider]  vitamin B-12 (CYANOCOBALAMIN) 500 MCG tablet Take 500 mcg by mouth daily.   Yes [provider]  vitamin E 180 MG (400 UNITS) capsule Take 400 Units by mouth daily.   Yes [provider]  HYDROcodone-acetaminophen (NORCO/VICODIN) 5-325 MG tablet Take 1 tablet by mouth every 6 (six) hours as needed (pain.). Patient not taking: Reported on 10/18/2021 10/27/20   [provider]  HYDROcodone-acetaminophen (NORCO/VICODIN) 5-325 MG tablet Take 1 tablet by mouth every 4 (four) hours as needed for moderate pain (pain score 4-6). Patient not taking: Reported on 10/18/2021 10/04/21   Carlynn Spry, PA-C  ondansetron (ZOFRAN) 4 MG tablet Take 8 mg by mouth 2 (two) times daily. Patient not taking: Reported on 10/26/2021 10/16/21   [provider]  rifampin (RIFADIN) 300 MG capsule Take 1 capsule (300 mg total) by mouth 2 (two) times daily. Patient not taking: Reported on 10/23/2021 10/04/21 11/12/21  Carlynn Spry, PA-C  sacubitril-valsartan (ENTRESTO) 24-26 MG Take 1 tablet by mouth 2 (two) times daily. Patient not taking: Reported on 10/05/2021 09/25/21 11/24/21  Furth, Cadence H, PA-C  spironolactone (ALDACTONE) 25 MG tablet Take 0.5 tablets (12.5 mg total) by mouth daily. Patient not taking: Reported on 10/23/2021 08/21/21   Leanor Kail, PA   Review of Systems   Constitutional:  Positive for malaise/fatigue.  HENT: Negative.    Eyes: Negative.   Respiratory:  Positive for shortness of breath. Negative for cough and wheezing.   Cardiovascular:  Positive for leg swelling (improving). Negative for chest pain, palpitations and orthopnea.  Gastrointestinal:  Positive for constipation and nausea (just this morning).       Abdominal distention  Genitourinary: Negative.   Musculoskeletal: Negative.  Negative for falls.  Skin: Negative.   Neurological:  Negative for dizziness, weakness and headaches.  Endo/Heme/Allergies: Negative.   Psychiatric/Behavioral:  The patient is not nervous/anxious and does not have insomnia.     Vitals:   10/26/21 1352  BP: (!) 114/58  Pulse: 79  Resp: 18  Weight: 194 lb (88 kg)  Height: _0  (1.803 m)   Wt Readings from Last 3 Encounters:  10/26/21 194 lb (88 kg)  10/23/21 198 lb 6 oz (90 kg)  10/19/21 195 lb 8 oz (88.7 kg)   Lab Results  Component Value Date   CREATININE 1.11 10/19/2021   CREATININE 1.15 10/02/2021   CREATININE 1.07 08/20/2021   Physical Exam Vitals and nursing note reviewed. Exam conducted with a chaperone present (daughter).  Constitutional:      General: He is not in acute distress.    Appearance: He is not ill-appearing.  Neck:     Vascular: No carotid bruit.  Cardiovascular:     Rate and Rhythm: Normal rate and regular rhythm.     Heart sounds: Normal heart sounds.  Pulmonary:     Effort: No respiratory distress.     Breath sounds: No wheezing or rales.  Abdominal:     General: There is no distension.     Palpations: Abdomen is soft.  Musculoskeletal:        General: No tenderness.     Right lower leg: Edema (1+ pitting although softer) present.     Left lower leg: Edema (1+ pitting although softer) present.  Skin:    General: Skin is warm and dry.  Neurological:     Mental Status: He is alert and oriented to person, place, and time.  Psychiatric:        Mood and  Affect: Mood normal.        Thought Content: Thought content normal.    Assessment & Plan: 1: Chronic Heart failure with reduced ejection fraction- - NYHA III - minimally fluid overloaded but better since taking metolazone - weighing daily; reminded to call for an overnight weight gain of > 2 pounds or a weekly weight gain of > 5 pounds - weight down 4 pounds from last visit here 3 days ago - on GDMT of metoprolol succinate and spironolactone  - allergic to Jardiance - BP historically soft; today is 114/58 - took metolazone 2.72m daily for the last 3 days & says that his abdomen no longer feels swollen - will check BMP today; depending on those results, may give 2.568mmetolazone/ 2064mK+ twice weekly (daughter asks to send this in so she can pick it up while she's here but will not give to patient until we see him next week) - O2 @ 2L QHS and PRN during the day; explained that he needed to wear his oxygen during the day more often than not; advised daughter to f/u with PCP regarding getting portable oxygen as cardiology says that they don't order it - BNP 10/19/21 was 2372.6  2.  CAD- - DES to ostial LAD - ASA, plavix - saw cardiology (FuKathlen Mody2/6/22  3.  DM- - A1c 09/26/21 was 7.0% - BMP 10/22/21 reviewed and showed sodium  137, potassium 4.0, creatinine 0.95 and GFR 78 - glucose at home today was 121 - saw PCP Edwina Barth) 07/11/21  4. Lymphedema - stage 2 - limited in his ability to exercise due to his shortness of breath - woody appearance but no open wounds - does not have compression socks on ; encouraged to wear them daily with removal at bedtime - consider compression boots if edema persists   Medication list reviewed.   Return in 4 days, sooner if needed

## 2021-10-26 NOTE — Telephone Encounter (Signed)
Was able to call Mr. Quiroa to advised that our cardiologist office does not handle oxygen tanks or titration.  Mr. Uzzle verbalized understanding, he reports already spoke to [facility] NP, she told him to call his PCP for oxygen needs.   But pt is grateful for returning call, nothing further at this time.

## 2021-10-26 NOTE — Telephone Encounter (Signed)
Patient daughter calling to get an order for o2 changed to portable order.

## 2021-10-26 NOTE — Telephone Encounter (Signed)
10/22/21 results obtained from [facility]:  Sodium 137 Potassium 4.0 BUN 32 Creatinine 0.95 GFR 78 Hemoglobin 9.1 CRP 12 (was 36)

## 2021-10-26 NOTE — Patient Instructions (Signed)
Continue weighing daily and call for an overnight weight gain of 3 pounds or more or a weekly weight gain of more than 5 pounds.  °

## 2021-10-29 NOTE — Progress Notes (Signed)
Patient ID: Henry Lindsey MRN: [id number]; DOB: [birth year]   Henry Lindsey is a 88 y.o. male with a history of CAD, ICM, HFrEF, HTN, HL, DMII, GERD, CVA, PAD, and carotid arterial disease.  Echo 08/10/21: Left Ventricle: Left ventricular ejection fraction, by estimation, is 25 to 30%. The left ventricle has severely decreased function. The left ventricle demonstrates global hypokinesis. The average left ventricular global longitudinal strain is -6.3 %. The global longitudinal strain is abnormal. The left ventricular internal  cavity size was normal in size. There is no left ventricular hypertrophy. Left ventricular diastolic parameters are consistent with Grade II diastolic dysfunction (pseudonormalization).   Right Ventricle: The right ventricular size is moderately enlarged. No increase in right ventricular wall thickness. Right ventricular systolic function is moderately reduced. There is mildly elevated pulmonary artery systolic pressure. The tricuspid  regurgitant velocity is 2.58 m/s, and with an assumed right atrial  pressure of 15 mmHg, the estimated right ventricular systolic pressure is 52.7 mmHg.   LHC 08/15/21:   Mid LM to Dist LM lesion is 30% stenosed.   Ost LAD lesion is 85% stenosed.   Prox LAD lesion is 50% stenosed.   Dist LAD lesion is 35% stenosed.   Prox Cx lesion is 50% stenosed.   Prox RCA to Dist RCA lesion is 100% stenosed.   1st Diag lesion is 80% stenosed.   1st Mrg lesion is 70% stenosed.   A drug-eluting stent was successfully placed using a STENT ONYX FRONTIER 2.75X26.   Post intervention, there is a 0% residual stenosis.   Post intervention, there is a 0% residual stenosis.   Post intervention, there is a 0% residual stenosis.   Successful Impella supported high risk PCI of the ostial LAD with atherectomy and drug-eluting stent placement.  The stent extended 1 to 2 mm into the left main coronary artery to ensure coverage of the ostium.  There was normal flow  into the  left circumflex.Moderately elevated left ventricular end-diastolic pressure at 24 mmHg at the beginning of the case.  Admitted 10/01/21 due to infection in right knee with right knee replacement. Discharged after 3 days. Admitted 10/20-10/26/22 at [facility] due to progressive dyspnea, increasing abdominal girth, lower extremity edema and transferred to [facility] for high risk PCI. Successful Impella supported high risk PCI of the ostial LAD with atherectomy and drug-eluting stent placement.   He presents today for a follow-up visit with a chief complaint of minimal fatigue upon moderate exertion. He describes this as chronic in nature although feels like his energy level is improving. He has associated pedal edema & constipation along with this. He denies any difficulty sleeping, dizziness, shortness of breath, cough, palpitations, chest pain or weight gain.   Home weight log reviewed and he's down 7 pounds in the last week.   Past Medical History:  Diagnosis Date   Anginal pain ([place])    Aortic atherosclerosis (HCC)    Arthritis    BPH (benign prostatic hyperplasia)    CAD (coronary artery disease)    a.) 2/22 Cath: LM 50d, LAD sev ost dz, mod-sev prox/mid dz. D1 sev dz. LCX mild prox dzs, OM2 mod-sev dz, RCA 100p. RPL and RPDA fill via L-R collats. EF 35%. Seen by CVTS->Med mgmt. b.) R/LHC 08/14/21: 30% m-d-LM, 85% oLAD, 50% pLAD, 35% dLAD, 80% D1, 50% pLCx, 70% OM1, 100% p-dRCA -> trans to [facility]. c.) 08/15/21 Impella supported HIGH RISK PCI with atherectomy. 2.75 x 42m Onyx Frontier DES oLAD.   Carotid  artery stenosis    a. 11/2010 U/S: <50% bilaterally; b. 03/2021 40-59% bilat ICA stenoses.   COPD (chronic obstructive pulmonary disease) ([place])    noted CT 10/05/16 former smoker quit age 5    CVA (cerebral infarction) 11/2010   a.) RIGHT thalamic lacunar   GERD (gastroesophageal reflux disease)    Heart murmur    HFrEF (heart failure with reduced ejection fraction) ([place])    a. 10/2020 Echo: EF  35%.   Hyperlipidemia    Hypertension    Ischemic cardiomyopathy    a. 10/2020 Echo: EF 35%. Nl RVSP. Mod MR. Mild TR; b. 11/2020 Cath:  CO/CI 5.05/2.49. LV gram: EF 35%.   Lower extremity cellulitis    NSTEMI (non-ST elevated myocardial infarction) ([place]) 08/09/2021   a.) R/LHC 08/14/2021: 30% m-d-LM, 85% oLAD, 50% pLAD, 35% dLAD, 80% D1, 50% pLCx, 70% OM1, 100% p-dRCA -> transfer to [facility]. b.) 08/15/2021 Impella supported HIGH RISK PCI with atherectomy. 2.75 x 68m Onyx Frontier DES to [employer]   Peripheral vascular disease in diabetes mellitus ([place]    a. 06/2012 95% occlusion s/p PTA R SFA (Dr. DLucky Cowboy; b. 03/2021 ABIs: R 1.02, L 0.96.   Pneumonia    Type 2 diabetes mellitus treated with insulin ([facility]    Past Surgical History:  Procedure Laterality Date   ABDOMINAL AORTOGRAM N/A 08/14/2021   Procedure: ABDOMINAL AORTOGRAM;  Surgeon: ENelva Bush MD;  [place] [place];  Service: Cardiovascular;  Laterality: N/A;   CHOLECYSTECTOMY N/A 11/13/2016   Procedure: LAPAROSCOPIC CHOLECYSTECTOMY WITH INTRAOPERATIVE CHOLANGIOGRAM;  Surgeon: JOlean Ree MD;  Location: [facility] ORS;  Service: General;  Laterality: N/A;   COLONOSCOPY WITH PROPOFOL N/A 02/10/2018   Procedure: COLONOSCOPY WITH PROPOFOL;  Surgeon: WLucilla Lame MD;  Location: [facility] ENDOSCOPY;  Service: Endoscopy;  Laterality: N/A;   CORONARY ATHERECTOMY N/A 08/15/2021   Procedure: CORONARY ATHERECTOMY;  Surgeon: AWellington Hampshire MD;  [place];  Service: Cardiovascular;  Laterality: N/A;   CORONARY STENT INTERVENTION W/IMPELLA N/A 08/15/2021   Procedure: Coronary Stent Intervention w/Impella;  Surgeon: AWellington Hampshire MD;  [place] [facility];  Service: Cardiovascular;  Laterality: N/A;   ERCP N/A 11/17/2016   Procedure: ENDOSCOPIC RETROGRADE CHOLANGIOPANCREATOGRAPHY (ERCP);  Surgeon: JIrene Shipper MD;  [facility];  Service: Endoscopy;  Laterality: N/A;   ERCP N/A 02/04/2017   Procedure:  ENDOSCOPIC RETROGRADE CHOLANGIOPANCREATOGRAPHY (ERCP) Stent removal;  Surgeon: DLucilla Lame MD;  Location: [facility] ENDOSCOPY;  Service: Endoscopy;  Laterality: N/A;   IR GENERIC HISTORICAL  10/06/2016   IR PERC CHOLECYSTOSTOMY 10/06/2016 GAletta Edouard MD [facility]-INTERV RAD   JOINT REPLACEMENT Left 2014   left knee   RIGHT/LEFT HEART CATH AND CORONARY ANGIOGRAPHY N/A 11/24/2020   Procedure: RIGHT/LEFT HEART CATH AND CORONARY ANGIOGRAPHY;  Surgeon: GMinna Merritts MD;  [place];  Service: Cardiovascular;  Laterality: N/A;   RIGHT/LEFT HEART CATH AND CORONARY ANGIOGRAPHY N/A 08/14/2021   Procedure: RIGHT/LEFT HEART CATH AND CORONARY ANGIOGRAPHY;  Surgeon: ENelva Bush MD;  [place] [place];  Service: Cardiovascular;  Laterality: N/A;   TOTAL KNEE ARTHROPLASTY Right 08/08/2020   Procedure: TOTAL KNEE ARTHROPLASTY;  Surgeon: BLovell Sheehan MD;  Location: [facility] ORS;  Service: Orthopedics;  Laterality: Right;   TOTAL KNEE REVISION Right 10/01/2021   Procedure: IRRIGATION AND DEBRIDMENT RIGHT KNEE WITH POLY EXCHANGE;  Surgeon: BLovell Sheehan MD;  Location: [facility] ORS;  Service: Orthopedics;  Laterality: Right;   UPPER GASTROINTESTINAL ENDOSCOPY  WRIST SURGERY     Family History  Problem Relation Age of Onset   Diabetes Mother    Social History   Tobacco Use   Smoking status: Former    Packs/day: 0.50    Years: 33.00    Pack years: 16.50    Types: Cigarettes    Quit date: 04/21/1968    Years since quitting: 53.5   Smokeless tobacco: Never  Substance Use Topics   Alcohol use: No   Allergies  Allergen Reactions   Jardiance [Empagliflozin]     Rash all over and lips turn purple.    Metformin And Related Diarrhea   Trazodone Other (See Comments)    Extreme hallucinations/ psychosis    Torsemide Rash    "Skin reaction"   Prior to Admission medications   Medication Sig Start Date End Date Taking? Authorizing Provider  ascorbic acid (VITAMIN C) 500  MG tablet Take 500 mg by mouth daily.    Yes [provider]  aspirin EC 81 MG tablet Take 1 tablet (81 mg total) by mouth daily. Swallow whole. 01/31/21  Yes Minna Merritts, MD  atorvastatin (LIPITOR) 40 MG tablet Take 1 tablet (40 mg total) by mouth daily. Patient taking differently: Take 40 mg by mouth in the morning. 12/08/20  Yes Gollan, Kathlene November, MD  Blood Glucose Monitoring Suppl (ONE TOUCH ULTRA SYSTEM KIT) w/Device KIT 1 kit by Does not apply route once. Use DX code E11.59 10/18/15  Yes [name], MD  ceFAZolin (ANCEF) IVPB Inject 2 g into the vein every 8 (eight) hours. Indication:  MSSA prosthetic joint infection of knee First Dose: Yes Last Day of Therapy:  11/12/2021 Labs - Once weekly on MONDAYS:  CBC/D, CMP, ESR and CRP Please pull PIC at completion of IV antibiotics Fax weekly labs to [phone] Method of administration: IV Push Method of administration may be changed at the discretion of home infusion pharmacist based upon assessment of the patient and/or caregiver's ability to self-administer the medication ordered. 10/04/21 11/12/21 Yes Carlynn Spry, PA-C  cholecalciferol (VITAMIN D) 25 MCG (1000 UNIT) tablet Take 1,000 Units by mouth daily.   Yes [provider]  clopidogrel (PLAVIX) 75 MG tablet TAKE 1 TABLET(75 MG) BY MOUTH DAILY WITH BREAKFAST 09/11/21  Yes Gollan, Kathlene November, MD  cyclobenzaprine (FLEXERIL) 5 MG tablet Take 5-10 mg by mouth daily as needed for muscle spasms. 07/31/21  Yes [provider]  diclofenac Sodium (VOLTAREN) 1 % GEL Apply 2 g topically 2 (two) times daily as needed (pain).   Yes [provider]  docusate sodium (COLACE) 100 MG capsule Take 1 capsule (100 mg total) by mouth 2 (two) times daily. 10/04/21  Yes Carlynn Spry, PA-C  furosemide (LASIX) 40 MG tablet Take 1 tablet (40 mg total) by mouth 2 (two) times daily. 10/19/21 01/17/22 Yes Yiannis Tulloch, Otila Kluver A, FNP  gabapentin (NEURONTIN) 400 MG capsule Take  400 mg by mouth 2 (two) times daily.   Yes [provider]  glipiZIDE (GLUCOTROL) 10 MG tablet Take 10 mg by mouth daily before breakfast.   Yes [provider]  HYDROcodone-acetaminophen (NORCO/VICODIN) 5-325 MG tablet Take 1 tablet by mouth every 4 (four) hours as needed for moderate pain (pain score 4-6). 10/04/21  Yes Carlynn Spry, PA-C  INS SYRINGE/NEEDLE 1CC/28G (B-D INSULIN SYRINGE 1CC/28G) 28G X 1/2" 1 ML MISC USE TO ADMINISTER INSULIN DAILY 02/13/16  Yes [name], MD  insulin aspart protamine- aspart (NOVOLOG MIX 70/30) (70-30) 100 UNIT/ML  injection Inject 0.15 mLs (15 Units total) into the skin 2 (two) times daily with a meal. 08/14/21  Yes Fritzi Mandes, MD  latanoprost (XALATAN) 0.005 % ophthalmic solution Place 1 drop into both eyes at bedtime. 11/01/15  Yes [provider]  metolazone (ZAROXOLYN) 2.5 MG tablet Take 1 tablet (2.5 mg total) by mouth 2 (two) times a week. Take this 1/2 hour before furosemide 10/29/21 01/27/22 Yes Anelle Parlow A, FNP  metoprolol succinate (TOPROL-XL) 50 MG 24 hr tablet Take 1 tablet (50 mg total) by mouth daily. Take with or immediately following a meal. 08/21/21  Yes Bhagat, Bhavinkumar, PA  Multiple Vitamin (MULTIVITAMIN WITH MINERALS) TABS tablet Take 1 tablet by mouth in the morning.   Yes [provider]  oxyCODONE (OXY IR/ROXICODONE) 5 MG immediate release tablet Take 5 mg by mouth every 4 (four) hours as needed for severe pain.   Yes [provider]  pantoprazole (PROTONIX) 40 MG tablet Take 40 mg by mouth daily.   Yes [provider]  potassium chloride SA (KLOR-CON M) 20 MEQ tablet Take 1 tablet (20 mEq total) by mouth 2 (two) times a week. 10/29/21  Yes Darylene Price A, FNP  tamsulosin (FLOMAX) 0.4 MG CAPS capsule Take 0.4 mg by mouth daily after breakfast.   Yes [provider]  timolol (TIMOPTIC) 0.5 % ophthalmic solution Place 1 drop into both eyes in the morning. 12/19/17  Yes [provider]  vitamin B-12 (CYANOCOBALAMIN) 500 MCG tablet Take 500 mcg by mouth daily.   Yes [provider]  vitamin E 180 MG (400 UNITS) capsule Take 400 Units by mouth daily.   Yes [provider]  albuterol (VENTOLIN HFA) 108 (90 Base) MCG/ACT inhaler Inhale 2 puffs into the lungs every 6 (six) hours as needed for shortness of breath or wheezing. Patient not taking: Reported on 10/30/2021 10/29/20   [provider]  nitroGLYCERIN (NITROSTAT) 0.4 MG SL tablet Place 1 tablet (0.4 mg total) under the tongue every 5 (five) minutes as needed for chest pain. MAXIMUM 3 TABLETS Patient not taking: Reported on 10/30/2021 12/08/20   Minna Merritts, MD  ONE [facility] ULTRA TEST test strip USE 1 STRIP TO TEST THREE TIMES DAILY AS DIRECTED 08/31/18   [name], MD  [facility] DELICA LANCETS 37S MISC Use three times daily to check blood sugar. 10/18/15   [name], MD  rifampin (RIFADIN) 300 MG capsule Take 1 capsule (300 mg total) by mouth 2 (two) times daily. Patient not taking: Reported on 10/23/2021 10/04/21 11/12/21  Carlynn Spry, PA-C  sacubitril-valsartan (ENTRESTO) 24-26 MG Take 1 tablet by mouth 2 (two) times daily. Patient not taking: Reported on 10/05/2021 09/25/21 11/24/21  Furth, Cadence H, PA-C  spironolactone (ALDACTONE) 25 MG tablet Take 0.5 tablets (12.5 mg total) by mouth daily. Patient not taking: Reported on 10/23/2021 08/21/21   Leanor Kail, PA  tiZANidine (ZANAFLEX) 4 MG tablet Take 4 mg by mouth at bedtime. 10/29/21   [provider]    Review of Systems  Constitutional:  Positive for malaise/fatigue.  HENT: Negative.    Eyes: Negative.   Respiratory:  Negative for cough, shortness of breath and wheezing.   Cardiovascular:  Positive for leg swelling (improving). Negative for chest pain, palpitations and orthopnea.  Gastrointestinal:  Positive for constipation. Negative for diarrhea and nausea.  Genitourinary: Negative.    Musculoskeletal: Negative.  Negative for falls.  Skin: Negative.   Neurological:  Negative for dizziness, weakness and headaches.  Endo/Heme/Allergies: Negative.   Psychiatric/Behavioral:  The patient is not nervous/anxious and does not have insomnia.     Vitals:   10/30/21 1028  BP: 112/70  Pulse: (!) 50  Resp: 16  SpO2: 96%  Weight: 194 lb 2 oz (88.1 kg)  Height: '5\' 11"'  (1.803 m)   Wt Readings from Last 3 Encounters:  10/30/21 194 lb 2 oz (88.1 kg)  10/26/21 194 lb (88 kg)  10/23/21 198 lb 6 oz (90 kg)   Lab Results  Component Value Date   CREATININE 1.10 10/26/2021   CREATININE 1.11 10/19/2021   CREATININE 1.15 10/02/2021   Physical Exam Vitals and nursing note reviewed. Exam conducted with a chaperone present (girlfriend [name])).  Constitutional:      General: He is not in acute distress.    Appearance: He is not ill-appearing.  Neck:     Vascular: No carotid bruit.  Cardiovascular:     Rate and Rhythm: Regular rhythm. Bradycardia present.     Heart sounds: Normal heart sounds.  Pulmonary:     Effort: No respiratory distress.     Breath sounds: No wheezing or rales.  Abdominal:     General: There is no distension.     Palpations: Abdomen is soft.  Musculoskeletal:        General: No tenderness.     Right lower leg: Edema (1+ pitting) present.     Left lower leg: Edema (1+ pitting) present.  Skin:    General: Skin is warm and dry.  Neurological:     Mental Status: He is alert and oriented to person, place, and time.  Psychiatric:        Mood and Affect: Mood normal.        Thought Content: Thought content normal.    Assessment & Plan: 1: Chronic Heart failure with reduced ejection fraction- - NYHA II - euvolemic today - weighing daily; reminded to call for an overnight weight gain of > 2 pounds or a weekly weight gain of > 5 pounds - weight unchanged from last visit here 4 days ago; home weight log shows 7 pound loss in last week - on GDMT of  metoprolol succinate  - bradycardic so unable to titrate metoprolol - add 12.96m losartan today and check BMP next visit (was hypotensive w/entresto) - allergic to Jardiance - BP historically soft; today is 112/70 - O2 @ 2L QHS and PRN during the day; they are to follow-up with PCP regarding O2 order - BNP 10/19/21 was 2372.6 - PharmD reconciled medications with the patient  2.  CAD- - DES to ostial LAD - ASA, plavix - saw cardiology (Kathlen Mody 09/25/21  3.  DM- - A1c 09/26/21 was 7.0% - BMP 10/26/21 reviewed and showed sodium 130, potassium 3.9, creatinine 1.10 and GFR >60 - glucose at home today was 180 - saw PCP (Edwina Barth 07/11/21  4. Lymphedema - stage 2 - limited in his ability to exercise due to his shortness of breath - woody appearance but no open wounds - does not have compression socks on; encouraged to wear them daily with removal at bedtime - will give metolazone 2.523mand extra 20-meq potassium twice on week on Wed & Sat until he finishes the IV antibiotic treatment - consider compression boots if edema persists   Medication list reviewed  Return in 2 weeks, sooner if needed for any questions/problems.

## 2021-10-30 ENCOUNTER — Ambulatory Visit: Payer: Medicare Other | Attending: Infectious Diseases | Admitting: Infectious Diseases

## 2021-10-30 ENCOUNTER — Encounter: Payer: Self-pay | Admitting: Family

## 2021-10-30 ENCOUNTER — Other Ambulatory Visit: Payer: Self-pay

## 2021-10-30 ENCOUNTER — Encounter: Payer: Self-pay | Admitting: Pharmacist

## 2021-10-30 ENCOUNTER — Telehealth: Payer: Self-pay

## 2021-10-30 ENCOUNTER — Ambulatory Visit: Payer: Medicare Other | Attending: Family | Admitting: Family

## 2021-10-30 VITALS — BP 112/70 | HR 50 | Resp 16 | Ht 71.0 in | Wt 194.1 lb

## 2021-10-30 VITALS — BP 107/72 | HR 84 | Temp 97.9°F | Resp 16 | Ht 73.0 in | Wt 194.0 lb

## 2021-10-30 DIAGNOSIS — E785 Hyperlipidemia, unspecified: Secondary | ICD-10-CM | POA: Insufficient documentation

## 2021-10-30 DIAGNOSIS — I890 Lymphedema, not elsewhere classified: Secondary | ICD-10-CM | POA: Insufficient documentation

## 2021-10-30 DIAGNOSIS — N181 Chronic kidney disease, stage 1: Secondary | ICD-10-CM

## 2021-10-30 DIAGNOSIS — I25118 Atherosclerotic heart disease of native coronary artery with other forms of angina pectoris: Secondary | ICD-10-CM | POA: Insufficient documentation

## 2021-10-30 DIAGNOSIS — I2510 Atherosclerotic heart disease of native coronary artery without angina pectoris: Secondary | ICD-10-CM | POA: Insufficient documentation

## 2021-10-30 DIAGNOSIS — Z7902 Long term (current) use of antithrombotics/antiplatelets: Secondary | ICD-10-CM | POA: Diagnosis not present

## 2021-10-30 DIAGNOSIS — T8450XD Infection and inflammatory reaction due to unspecified internal joint prosthesis, subsequent encounter: Secondary | ICD-10-CM | POA: Diagnosis not present

## 2021-10-30 DIAGNOSIS — K219 Gastro-esophageal reflux disease without esophagitis: Secondary | ICD-10-CM | POA: Diagnosis not present

## 2021-10-30 DIAGNOSIS — Z7982 Long term (current) use of aspirin: Secondary | ICD-10-CM | POA: Diagnosis not present

## 2021-10-30 DIAGNOSIS — D649 Anemia, unspecified: Secondary | ICD-10-CM | POA: Diagnosis not present

## 2021-10-30 DIAGNOSIS — Z09 Encounter for follow-up examination after completed treatment for conditions other than malignant neoplasm: Secondary | ICD-10-CM | POA: Diagnosis not present

## 2021-10-30 DIAGNOSIS — I110 Hypertensive heart disease with heart failure: Secondary | ICD-10-CM | POA: Insufficient documentation

## 2021-10-30 DIAGNOSIS — Z955 Presence of coronary angioplasty implant and graft: Secondary | ICD-10-CM | POA: Insufficient documentation

## 2021-10-30 DIAGNOSIS — I779 Disorder of arteries and arterioles, unspecified: Secondary | ICD-10-CM | POA: Insufficient documentation

## 2021-10-30 DIAGNOSIS — Z87891 Personal history of nicotine dependence: Secondary | ICD-10-CM | POA: Insufficient documentation

## 2021-10-30 DIAGNOSIS — E1122 Type 2 diabetes mellitus with diabetic chronic kidney disease: Secondary | ICD-10-CM | POA: Insufficient documentation

## 2021-10-30 DIAGNOSIS — Z8673 Personal history of transient ischemic attack (TIA), and cerebral infarction without residual deficits: Secondary | ICD-10-CM | POA: Insufficient documentation

## 2021-10-30 DIAGNOSIS — I252 Old myocardial infarction: Secondary | ICD-10-CM | POA: Insufficient documentation

## 2021-10-30 DIAGNOSIS — I5022 Chronic systolic (congestive) heart failure: Secondary | ICD-10-CM | POA: Insufficient documentation

## 2021-10-30 DIAGNOSIS — E1151 Type 2 diabetes mellitus with diabetic peripheral angiopathy without gangrene: Secondary | ICD-10-CM | POA: Diagnosis not present

## 2021-10-30 DIAGNOSIS — Z79899 Other long term (current) drug therapy: Secondary | ICD-10-CM | POA: Insufficient documentation

## 2021-10-30 DIAGNOSIS — Z794 Long term (current) use of insulin: Secondary | ICD-10-CM

## 2021-10-30 MED ORDER — LOSARTAN POTASSIUM 25 MG PO TABS: 12.5000 mg | ORAL_TABLET | Freq: Every day | ORAL | 5 refills | Status: DC

## 2021-10-30 NOTE — Patient Instructions (Addendum)
Continue weighing daily and call for an overnight weight gain of 3 pounds or more or a weekly weight gain of more than 5 pounds.    Begin losartan as 1/2 tablet every morning.    Take the booster fluid pill and extra potassium on Wed and Saturday.

## 2021-10-30 NOTE — Patient Instructions (Signed)
You are here for follow up of rt knee PJI with staph aureus- you are on IV cefazolin 2 grams Q8- until 11/12/21- after which PICC will be removed- If your liver test from yesterday is normal then wecan start rifampin After you complete antibiotic on1/23/23 you will take oral antibiotics for 6 months Will see you back in 1 month

## 2021-10-30 NOTE — Progress Notes (Signed)
[facility] - PHARMACIST COUNSELING NOTE  Guideline-Directed Medical Therapy/Evidence Based Medicine  ACE/ARB/ARNI: Sacubitril-valsartan 24-26 mg twice daily -  not taking Beta Blocker: Metoprolol succinate 50 mg daily Aldosterone Antagonist: Spironolactone 12.5 mg daily Diuretic: Furosemide 40 mg BID  and metolazone 2.58m 2x/week SGLT2i:  n/a  - allergic to Jardiance  Adherence Assessment  Do you ever forget to take your medication? _0 Yes _1 No  Do you ever skip doses due to side effects? _2 Yes _3 No  Do you have trouble affording your medicines? _4 Yes _5 No  Are you ever unable to pick up your medication due to transportation difficulties? _6 Yes _7 No  Do you ever stop taking your medications because you don't believe they are helping? _8 Yes _9 No  Do you check your weight daily? _10 Yes _11 No   Adherence strategy: Daughter help with medication. Uses pillbox  Barriers to obtaining medications: none  Vital signs: HR 50, BP 112/70, weight (pounds) 194lb (unchanged from last visit)  ECHO: Date 08/10/21, EF 25-30%, The left ventricle has severely decreased function. The left ventricle demonstrates global hypokinesis.  Cath: Date 08/15/21. Successful Impella supported high risk PCI of the ostial LAD with atherectomy and drug-eluting stent placement.  BMP Latest Ref Rng & Units 10/26/2021 10/19/2021 10/02/2021  Glucose 70 - 99 mg/dL 180(H) 68(L) 145(H)  BUN 8 - 23 mg/dL 43(H) 40(H) 28(H)  Creatinine 0.61 - 1.24 mg/dL 1.10 1.11 1.15  BUN/Creat Ratio 10 - 24 - - -  Sodium 135 - 145 mmol/L 130(L) 133(L) 133(L)  Potassium 3.5 - 5.1 mmol/L 3.9 4.3 4.3  Chloride 98 - 111 mmol/L 92(L) 97(L) 101  CO2 22 - 32 mmol/L _12 Calcium 8.9 - 10.3 mg/dL 8.7(L) 8.6(L) 8.5(L)    Past Medical History:  Diagnosis Date   Anginal pain (HCC)    Aortic atherosclerosis (HCC)    Arthritis    BPH (benign prostatic hyperplasia)    CAD (coronary artery  disease)    a.) 2/22 Cath: LM 50d, LAD sev ost dz, mod-sev prox/mid dz. D1 sev dz. LCX mild prox dzs, OM2 mod-sev dz, RCA 100p. RPL and RPDA fill via L-R collats. EF 35%. Seen by CVTS->Med mgmt. b.) R/LHC 08/14/21: 30% m-d-LM, 85% oLAD, 50% pLAD, 35% dLAD, 80% D1, 50% pLCx, 70% OM1, 100% p-dRCA -> trans to [facility]. c.) 08/15/21 Impella supported HIGH RISK PCI with atherectomy. 2.75 x 280mOnyx Frontier DES oLAD.   Carotid artery stenosis    a. 11/2010 U/S: <50% bilaterally; b. 03/2021 40-59% bilat ICA stenoses.   COPD (chronic obstructive pulmonary disease) ([facility]   noted CT 10/05/16 former smoker quit age 88  CVA (cerebral infarction) 11/2010   a.) RIGHT thalamic lacunar   GERD (gastroesophageal reflux disease)    Heart murmur    HFrEF (heart failure with reduced ejection fraction) ([facility]   a. 10/2020 Echo: EF 35%.   Hyperlipidemia    Hypertension    Ischemic cardiomyopathy    a. 10/2020 Echo: EF 35%. Nl RVSP. Mod MR. Mild TR; b. 11/2020 Cath:  CO/CI 5.05/2.49. LV gram: EF 35%.   Lower extremity cellulitis    NSTEMI (non-ST elevated myocardial infarction) ([facility]   a.) R/LHC 08/14/2021: 30% m-d-LM, 85% oLAD, 50% pLAD, 35% dLAD, 80% D1, 50% pLCx, 70% OM1, 100% p-dRCA -> transfer to [facility]. b.) 08/15/2021 Impella supported HIGH RISK PCI with atherectomy. 2.75 x [id number] Frontier DES to [facility] Peripheral vascular disease in diabetes mellitus ([place]  a. 06/2012 95% occlusion s/p PTA R SFA (Dr. Lucky Cowboy); b. 03/2021 ABIs: R 1.02, L 0.96.   Pneumonia    Type 2 diabetes mellitus treated with insulin [facility])     ASSESSMENT 88 year old male who presents to the HF clinic follow up. Patient seen at the clinic for fluid overload on 10/23/2021 and 10/26/2021. Noted he is on IV Abx for infected knee (cefazolin 2 g every 8 hrs until 11/12/2021. Metolazone and potassium added to therapy x 3 days and repeat BMET ordered.   Patient is not using oxygen or wheel chair anymore. Reports slightly improvement in LEE and  breathing, but not significantly different since 1/6. Not taking Entresto d/t low BP and not taking spironolactone for unknown reason.   Recent ED Visit (past 6 months):  Date - 09/26/21, CC - Revision TKA (infected knee) Date - 08/14/21, CC - NSTEMI  PLAN  Recommendation: Continue metolazone and potassium supplement for now, until IV ABX completed D/C Entresto and start losartan at 12.35m daily Resume spironolactone once fluid status stable to decrease risk of AKI while titrating metolazone and furosemide  Time spent: 15 minutes  Raquel Rodriguez-Guzman PharmD, BCPS 10/30/2021 9:21 AM   Current Outpatient Medications:    albuterol (VENTOLIN HFA) 108 (90 Base) MCG/ACT inhaler, Inhale 2 puffs into the lungs every 6 (six) hours as needed for shortness of breath or wheezing., Disp: , Rfl:    ascorbic acid (VITAMIN C) 500 MG tablet, Take 500 mg by mouth daily. , Disp: , Rfl:    aspirin EC 81 MG tablet, Take 1 tablet (81 mg total) by mouth daily. Swallow whole., Disp: 90 tablet, Rfl: 3   atorvastatin (LIPITOR) 40 MG tablet, Take 1 tablet (40 mg total) by mouth daily. (Patient taking differently: Take 40 mg by mouth in the morning.), Disp: 90 tablet, Rfl: 3   Blood Glucose Monitoring Suppl (ONE TOUCH ULTRA SYSTEM KIT) w/Device KIT, 1 kit by Does not apply route once. Use DX code E11.59, Disp: 1 each, Rfl: 0   ceFAZolin (ANCEF) IVPB, Inject 2 g into the vein every 8 (eight) hours. Indication:  MSSA prosthetic joint infection of knee First Dose: Yes Last Day of Therapy:  11/12/2021 Labs - Once weekly on MONDAYS:  CBC/D, CMP, ESR and CRP Please pull PIC at completion of IV antibiotics Fax weekly labs to [phone] of administration: IV Push Method of administration may be changed at the discretion of home infusion pharmacist based upon assessment of the patient and/or caregiver's ability to self-administer the medication ordered., Disp: 120 Units, Rfl: 0   cholecalciferol (VITAMIN D) 25 MCG  (1000 UNIT) tablet, Take 1,000 Units by mouth daily., Disp: , Rfl:    clopidogrel (PLAVIX) 75 MG tablet, TAKE 1 TABLET(75 MG) BY MOUTH DAILY WITH BREAKFAST, Disp: 30 tablet, Rfl: 4   cyclobenzaprine (FLEXERIL) 5 MG tablet, Take 5-10 mg by mouth daily as needed for muscle spasms., Disp: , Rfl:    diclofenac Sodium (VOLTAREN) 1 % GEL, Apply 2 g topically 2 (two) times daily as needed (pain)., Disp: , Rfl:    docusate sodium (COLACE) 100 MG capsule, Take 1 capsule (100 mg total) by mouth 2 (two) times daily., Disp: 30 capsule, Rfl: 0   furosemide (LASIX) 40 MG tablet, Take 1 tablet (40 mg total) by mouth 2 (two) times daily., Disp: 60 tablet, Rfl: 5   gabapentin (NEURONTIN) 400 MG capsule, Take 400 mg by mouth 2 (two) times daily., Disp: , Rfl:  glipiZIDE (GLUCOTROL) 10 MG tablet, Take 10 mg by mouth daily before breakfast., Disp: , Rfl:    HYDROcodone-acetaminophen (NORCO/VICODIN) 5-325 MG tablet, Take 1 tablet by mouth every 6 (six) hours as needed (pain.). (Patient not taking: Reported on 10/18/2021), Disp: , Rfl:    HYDROcodone-acetaminophen (NORCO/VICODIN) 5-325 MG tablet, Take 1 tablet by mouth every 4 (four) hours as needed for moderate pain (pain score 4-6). (Patient not taking: Reported on 10/18/2021), Disp: 30 tablet, Rfl: 0   INS SYRINGE/NEEDLE 1CC/28G (B-D INSULIN SYRINGE 1CC/28G) 28G X 1/2" 1 ML MISC, USE TO ADMINISTER INSULIN DAILY, Disp: 100 each, Rfl: 0   insulin aspart protamine- aspart (NOVOLOG MIX 70/30) (70-30) 100 UNIT/ML injection, Inject 0.15 mLs (15 Units total) into the skin 2 (two) times daily with a meal., Disp: 10 mL, Rfl: 11   latanoprost (XALATAN) 0.005 % ophthalmic solution, Place 1 drop into both eyes at bedtime., Disp: , Rfl: 5   metolazone (ZAROXOLYN) 2.5 MG tablet, Take 1 tablet (2.5 mg total) by mouth 2 (two) times a week. Take this 1/2 hour before furosemide, Disp: 8 tablet, Rfl: 0   metoprolol succinate (TOPROL-XL) 50 MG 24 hr tablet, Take 1 tablet (50 mg total) by  mouth daily. Take with or immediately following a meal., Disp: 30 tablet, Rfl: 11   Multiple Vitamin (MULTIVITAMIN WITH MINERALS) TABS tablet, Take 1 tablet by mouth in the morning., Disp: , Rfl:    nitroGLYCERIN (NITROSTAT) 0.4 MG SL tablet, Place 1 tablet (0.4 mg total) under the tongue every 5 (five) minutes as needed for chest pain. MAXIMUM 3 TABLETS, Disp: 50 tablet, Rfl: 3   ondansetron (ZOFRAN) 4 MG tablet, Take 8 mg by mouth 2 (two) times daily. (Patient not taking: Reported on 10/26/2021), Disp: , Rfl:    ONE TOUCH ULTRA TEST test strip, USE 1 STRIP TO TEST THREE TIMES DAILY AS DIRECTED, Disp: 100 each, Rfl: 0   ONETOUCH DELICA LANCETS 02R MISC, Use three times daily to check blood sugar., Disp: 100 each, Rfl: 11   oxyCODONE (OXY IR/ROXICODONE) 5 MG immediate release tablet, Take 5 mg by mouth every 4 (four) hours as needed for severe pain., Disp: , Rfl:    pantoprazole (PROTONIX) 40 MG tablet, Take 40 mg by mouth daily., Disp: , Rfl:    potassium chloride (KLOR-CON) 10 MEQ tablet, Take 10 mEq by mouth daily., Disp: , Rfl:    potassium chloride SA (KLOR-CON M) 20 MEQ tablet, Take 1 tablet (20 mEq total) by mouth 2 (two) times a week., Disp: 8 tablet, Rfl: 0   rifampin (RIFADIN) 300 MG capsule, Take 1 capsule (300 mg total) by mouth 2 (two) times daily. (Patient not taking: Reported on 10/23/2021), Disp: 60 capsule, Rfl: 1   sacubitril-valsartan (ENTRESTO) 24-26 MG, Take 1 tablet by mouth 2 (two) times daily. (Patient not taking: Reported on 10/05/2021), Disp: 60 tablet, Rfl: 1   spironolactone (ALDACTONE) 25 MG tablet, Take 0.5 tablets (12.5 mg total) by mouth daily. (Patient not taking: Reported on 10/23/2021), Disp: 15 tablet, Rfl: 11   tamsulosin (FLOMAX) 0.4 MG CAPS capsule, Take 0.4 mg by mouth daily after breakfast., Disp: , Rfl:    timolol (TIMOPTIC) 0.5 % ophthalmic solution, Place 1 drop into both eyes in the morning., Disp: , Rfl: 2   vitamin B-12 (CYANOCOBALAMIN) 500 MCG tablet, Take 500  mcg by mouth daily., Disp: , Rfl:    vitamin E 180 MG (400 UNITS) capsule, Take 400 Units by mouth daily., Disp: , Rfl:  DRUGS TO CAUTION IN HEART FAILURE  Drug or Class Mechanism  Analgesics NSAIDs COX-2 inhibitors Glucocorticoids  Sodium and water retention, increased systemic vascular resistance, decreased response to diuretics   Diabetes Medications Metformin Thiazolidinediones Rosiglitazone (Avandia) Pioglitazone (Actos) DPP4 Inhibitors Saxagliptin (Onglyza) Sitagliptin (Januvia)   Lactic acidosis Possible calcium channel blockade   Unknown  Antiarrhythmics Class I  Flecainide Disopyramide Class III Sotalol Other Dronedarone  Negative inotrope, proarrhythmic   Proarrhythmic, beta blockade  Negative inotrope  Antihypertensives Alpha Blockers Doxazosin Calcium Channel Blockers Diltiazem Verapamil Nifedipine Central Alpha Adrenergics Moxonidine Peripheral Vasodilators Minoxidil  Increases renin and aldosterone  Negative inotrope    Possible sympathetic withdrawal  Unknown  Anti-infective Itraconazole Amphotericin B  Negative inotrope Unknown  Hematologic Anagrelide Cilostazol   Possible inhibition of PD IV Inhibition of PD III causing arrhythmias  Neurologic/Psychiatric Stimulants Anti-Seizure Drugs Carbamazepine Pregabalin Antidepressants Tricyclics Citalopram Parkinsons Bromocriptine Pergolide Pramipexole Antipsychotics Clozapine Antimigraine Ergotamine Methysergide Appetite suppressants Bipolar Lithium  Peripheral alpha and beta agonist activity  Negative inotrope and chronotrope Calcium channel blockade  Negative inotrope, proarrhythmic Dose-dependent QT prolongation  Excessive serotonin activity/valvular damage Excessive serotonin activity/valvular damage Unknown  IgE mediated hypersensitivy, calcium channel blockade  Excessive serotonin activity/valvular damage Excessive serotonin activity/valvular  damage Valvular damage  Direct myofibrillar degeneration, adrenergic stimulation  Antimalarials Chloroquine Hydroxychloroquine Intracellular inhibition of lysosomal enzymes  Urologic Agents Alpha Blockers Doxazosin Prazosin Tamsulosin Terazosin  Increased renin and aldosterone  Adapted from Page Carleene Overlie, et al. Drugs That May Cause or Exacerbate Heart Failure: A Scientific Statement from the American Heart  Association. Circulation 2016; 134:e32-e69. DOI: 10.1161/CIR.0000000000000426   MEDICATION ADHERENCES TIPS AND STRATEGIES Taking medication as prescribed improves patient outcomes in heart failure (reduces hospitalizations, improves symptoms, increases survival) Side effects of medications can be managed by decreasing doses, switching agents, stopping drugs, or adding additional therapy. Please let someone in the [facility] know if you have having bothersome side effects so we can modify your regimen. Do not alter your medication regimen without talking to [place].  Medication reminders can help patients remember to take drugs on time. If you are missing or forgetting doses you can try linking behaviors, using pill boxes, or an electronic reminder like an alarm on your phone or an app. Some people can also get automated phone calls as medication reminders.

## 2021-10-30 NOTE — Progress Notes (Signed)
NAME: BOYKIN BAETZ  DOB: [birth year]  MRN: [id number]  Date/Time: 10/30/2021 12:04 PM    Follow up after recent hospitalization for Rt knee prosthetic joint infection Henry Lindsey is a 88 y.o. with a history of  CAD status post stent placement, bilateral leg swelling, right prosthetic joint infection, peripheral arterial disease,  Patient underwent knee replacement April 2021.  Then in December 2021 patient started having shortness of breath and bronchitis-like feature and x-ray had shown bilateral pleural effusion which was getting worse with lower extremity edema.  He was given Lasix by his PCP and recommended echocardiogram.  Echo done on 11/14/2020 showed ejection fraction of 35% with moderate MR, mild TR and mild pericardial effusion. Then in February 2022 he underwent a cardiac cath and that showed 70% stenosis at the LAD and proximal RCA 100% stenosis and first diagonal 80% stenosis.  He was seen by cardiothoracic surgeon and [facility] and was recommended medical management. He was also followed at the wound clinic because of bilateral venous edema and venous stasis ulcers and weeping and scabbing wounds on the right leg.  During October 2022 he was hospitalized with worsening shortness of breath.  He was diagnosed with NSTEMI and acute on chronic systolic congestive heart failure and acute on chronic.  He underwent another cardiac cath which showed severe triple-vessel disease with LAD severe ostial disease.  Dr. Fletcher Anon took him to [facility] and did a successful Impella supported high risk PCI of the ostial LAD with atherectomy and drug-eluting stent placement on 08/15/21.  He was placed on dual antiplatelet therapy. Rt knee remained swollen and painful. He saw Dr. Harlow Mares on 09/21/2021 and the right knee was aspirated and a cortisone injection was given.  As the culture grew staph aureus patient was asked to get hospitalized and underwent irrigation and debridement right knee with polyexchange on  10/01/2021. He was discharged home on cefazolin 2 grams IV q8 and rifampin 364m PO bID until 11/12/21 for 6 weeks and then to be on oral antibiotics PT never started rifampin because of nausea and abnormal LFTS from CHF The nausea resolved once cefazolin was chnaged from syrine to pressured ball to be administered for a longer duration that 10 min push- TheLFTS improved with control of CHF, which was accomplished by his cardiologist with increase in diuretics and change  HE is doing much better now Says leg swelling is better Weight is stable and no nausea Appetite good  Past Medical History:  Diagnosis Date   Anginal pain ([facility]    Aortic atherosclerosis ([facility]    Arthritis    BPH (benign prostatic hyperplasia)    CAD (coronary artery disease)    a.) 2/22 Cath: LM 50d, LAD sev ost dz, mod-sev prox/mid dz. D1 sev dz. LCX mild prox dzs, OM2 mod-sev dz, RCA 100p. RPL and RPDA fill via L-R collats. EF 35%. Seen by CVTS->Med mgmt. b.) R/LHC 08/14/21: 30% m-d-LM, 85% oLAD, 50% pLAD, 35% dLAD, 80% D1, 50% pLCx, 70% OM1, 100% p-dRCA -> trans to [facility]. c.) 08/15/21 Impella supported HIGH RISK PCI with atherectomy. 2.75 x 218mOnyx Frontier DES oLAD.   Carotid artery stenosis    a. 11/2010 U/S: <50% bilaterally; b. 03/2021 40-59% bilat ICA stenoses.   COPD (chronic obstructive pulmonary disease) ([place]   noted CT 10/05/16 former smoker quit age 89  CVA (cerebral infarction) 11/2010   a.) RIGHT thalamic lacunar   GERD (gastroesophageal reflux disease)    Heart murmur  HFrEF (heart failure with reduced ejection fraction) ([place])    a. 10/2020 Echo: EF 35%.   Hyperlipidemia    Hypertension    Ischemic cardiomyopathy    a. 10/2020 Echo: EF 35%. Nl RVSP. Mod MR. Mild TR; b. 11/2020 Cath:  CO/CI 5.05/2.49. LV gram: EF 35%.   Lower extremity cellulitis    NSTEMI (non-ST elevated myocardial infarction) ([place]) 08/09/2021   a.) R/LHC 08/14/2021: 30% m-d-LM, 85% oLAD, 50% pLAD, 35% dLAD, 80% D1, 50% pLCx, 70%  OM1, 100% p-dRCA -> transfer to [facility]. b.) 08/15/2021 Impella supported HIGH RISK PCI with atherectomy. 2.75 x 59m Onyx Frontier DES to [employer]   Peripheral vascular disease in diabetes mellitus ([facility]    a. 06/2012 95% occlusion s/p PTA R SFA (Dr. DLucky Cowboy; b. 03/2021 ABIs: R 1.02, L 0.96.   Pneumonia    Type 2 diabetes mellitus treated with insulin ([facility]     Past Surgical History:  Procedure Laterality Date   ABDOMINAL AORTOGRAM N/A 08/14/2021   Procedure: ABDOMINAL AORTOGRAM;  Surgeon: ENelva Bush MD;  [facility];  Service: Cardiovascular;  Laterality: N/A;   CHOLECYSTECTOMY N/A 11/13/2016   Procedure: LAPAROSCOPIC CHOLECYSTECTOMY WITH INTRAOPERATIVE CHOLANGIOGRAM;  Surgeon: JOlean Ree MD;  Location: [facility] ORS;  Service: General;  Laterality: N/A;   COLONOSCOPY WITH PROPOFOL N/A 02/10/2018   Procedure: COLONOSCOPY WITH PROPOFOL;  Surgeon: WLucilla Lame MD;  Location: [facility] ENDOSCOPY;  Service: Endoscopy;  Laterality: N/A;   CORONARY ATHERECTOMY N/A 08/15/2021   Procedure: CORONARY ATHERECTOMY;  Surgeon: AWellington Hampshire MD;  [facility];  Service: Cardiovascular;  Laterality: N/A;   CORONARY STENT INTERVENTION W/IMPELLA N/A 08/15/2021   Procedure: Coronary Stent Intervention w/Impella;  Surgeon: AWellington Hampshire MD;  [facility];  Service: Cardiovascular;  Laterality: N/A;   ERCP N/A 11/17/2016   Procedure: ENDOSCOPIC RETROGRADE CHOLANGIOPANCREATOGRAPHY (ERCP);  Surgeon: JIrene Shipper MD;  [facility];  Service: Endoscopy;  Laterality: N/A;   ERCP N/A 02/04/2017   Procedure: ENDOSCOPIC RETROGRADE CHOLANGIOPANCREATOGRAPHY (ERCP) Stent removal;  Surgeon: DLucilla Lame MD;  Location: [facility];  Service: Endoscopy;  Laterality: N/A;   IR GENERIC HISTORICAL  10/06/2016   IR PERC CHOLECYSTOSTOMY 10/06/2016 GAletta Edouard MD [facility]   JOINT REPLACEMENT Left 2014   left knee   RIGHT/LEFT HEART CATH AND CORONARY  ANGIOGRAPHY N/A 11/24/2020   Procedure: RIGHT/LEFT HEART CATH AND CORONARY ANGIOGRAPHY;  Surgeon: GMinna Merritts MD;  [facility];  Service: Cardiovascular;  Laterality: N/A;   RIGHT/LEFT HEART CATH AND CORONARY ANGIOGRAPHY N/A 08/14/2021   Procedure: RIGHT/LEFT HEART CATH AND CORONARY ANGIOGRAPHY;  Surgeon: ENelva Bush MD;  [facility];  Service: Cardiovascular;  Laterality: N/A;   TOTAL KNEE ARTHROPLASTY Right 08/08/2020   Procedure: TOTAL KNEE ARTHROPLASTY;  Surgeon: BLovell Sheehan MD;  Location: [facility];  Service: Orthopedics;  Laterality: Right;   TOTAL KNEE REVISION Right 10/01/2021   Procedure: IRRIGATION AND DEBRIDMENT RIGHT KNEE WITH POLY EXCHANGE;  Surgeon: BLovell Sheehan MD;  Location: [facility] ORS;  Service: Orthopedics;  Laterality: Right;   UPPER GASTROINTESTINAL ENDOSCOPY     WRIST SURGERY      Social History   Socioeconomic History   Marital status: Widowed    Spouse name: SEnid Derry deceased   Number of children: 2   Years of education: 115  Highest education level: Not on file  Occupational History    Employer: retired  Tobacco Use   Smoking  status: Former    Packs/day: 0.50    Years: 33.00    Pack years: 16.50    Types: Cigarettes    Quit date: 04/21/1968    Years since quitting: 53.5   Smokeless tobacco: Never  Vaping Use   Vaping Use: Never used  Substance and Sexual Activity   Alcohol use: No   Drug use: No   Sexual activity: Not Currently    Partners: Female    Birth control/protection: None  Other Topics Concern   Not on file  Social History Narrative   Widowed in 2014; dating again , stays with girlfriend house   Social Determinants of Health   Financial Resource Strain: Not on file  Food Insecurity: Not on file  Transportation Needs: Not on file  Physical Activity: Not on file  Stress: Not on file  Social Connections: Not on file  Intimate Partner Violence: Not on file    Family History  Problem  Relation Age of Onset   Diabetes Mother    Allergies  Allergen Reactions   Jardiance [Empagliflozin]     Rash all over and lips turn purple.    Metformin And Related Diarrhea   Trazodone Other (See Comments)    Extreme hallucinations/ psychosis    Torsemide Rash    "Skin reaction"   I? Current Outpatient Medications  Medication Sig Dispense Refill   ascorbic acid (VITAMIN C) 500 MG tablet Take 500 mg by mouth daily.      aspirin EC 81 MG tablet Take 1 tablet (81 mg total) by mouth daily. Swallow whole. 90 tablet 3   atorvastatin (LIPITOR) 40 MG tablet Take 1 tablet (40 mg total) by mouth daily. 90 tablet 3   Blood Glucose Monitoring Suppl (ONE TOUCH ULTRA SYSTEM KIT) w/Device KIT 1 kit by Does not apply route once. Use DX code E11.59 1 each 0   ceFAZolin (ANCEF) IVPB Inject 2 g into the vein every 8 (eight) hours. Indication:  MSSA prosthetic joint infection of knee First Dose: Yes Last Day of Therapy:  11/12/2021 Labs - Once weekly on MONDAYS:  CBC/D, CMP, ESR and CRP Please pull PIC at completion of IV antibiotics Fax weekly labs to [phone] Method of administration: IV Push Method of administration may be changed at the discretion of home infusion pharmacist based upon assessment of the patient and/or caregiver's ability to self-administer the medication ordered. 120 Units 0   cholecalciferol (VITAMIN D) 25 MCG (1000 UNIT) tablet Take 1,000 Units by mouth daily.     clopidogrel (PLAVIX) 75 MG tablet TAKE 1 TABLET(75 MG) BY MOUTH DAILY WITH BREAKFAST 30 tablet 4   cyclobenzaprine (FLEXERIL) 5 MG tablet Take 5-10 mg by mouth daily as needed for muscle spasms.     diclofenac Sodium (VOLTAREN) 1 % GEL Apply 2 g topically 2 (two) times daily as needed (pain).     docusate sodium (COLACE) 100 MG capsule Take 1 capsule (100 mg total) by mouth 2 (two) times daily. 30 capsule 0   furosemide (LASIX) 40 MG tablet Take 1 tablet (40 mg total) by mouth 2 (two) times daily. 60 tablet 5    gabapentin (NEURONTIN) 400 MG capsule Take 400 mg by mouth 2 (two) times daily.     glipiZIDE (GLUCOTROL) 10 MG tablet Take 10 mg by mouth daily before breakfast.     HYDROcodone-acetaminophen (NORCO/VICODIN) 5-325 MG tablet Take 1 tablet by mouth every 4 (four) hours as needed for moderate pain (pain score 4-6). 30 tablet 0  INS SYRINGE/NEEDLE 1CC/28G (B-D INSULIN SYRINGE 1CC/28G) 28G X 1/2" 1 ML MISC USE TO ADMINISTER INSULIN DAILY 100 each 0   insulin aspart protamine- aspart (NOVOLOG MIX 70/30) (70-30) 100 UNIT/ML injection Inject 0.15 mLs (15 Units total) into the skin 2 (two) times daily with a meal. 10 mL 11   latanoprost (XALATAN) 0.005 % ophthalmic solution Place 1 drop into both eyes at bedtime.  5   losartan (COZAAR) 25 MG tablet Take 0.5 tablets (12.5 mg total) by mouth daily. 30 tablet 5   metolazone (ZAROXOLYN) 2.5 MG tablet Take 1 tablet (2.5 mg total) by mouth 2 (two) times a week. Take this 1/2 hour before furosemide 8 tablet 0   metoprolol succinate (TOPROL-XL) 50 MG 24 hr tablet Take 1 tablet (50 mg total) by mouth daily. Take with or immediately following a meal. 30 tablet 11   Multiple Vitamin (MULTIVITAMIN WITH MINERALS) TABS tablet Take 1 tablet by mouth in the morning.     nitroGLYCERIN (NITROSTAT) 0.4 MG SL tablet Place 1 tablet (0.4 mg total) under the tongue every 5 (five) minutes as needed for chest pain. MAXIMUM 3 TABLETS 50 tablet 3   ONE TOUCH ULTRA TEST test strip USE 1 STRIP TO TEST THREE TIMES DAILY AS DIRECTED 100 each 0   ONETOUCH DELICA LANCETS 16X MISC Use three times daily to check blood sugar. 100 each 11   oxyCODONE (OXY IR/ROXICODONE) 5 MG immediate release tablet Take 5 mg by mouth every 4 (four) hours as needed for severe pain.     pantoprazole (PROTONIX) 40 MG tablet Take 40 mg by mouth daily.     potassium chloride SA (KLOR-CON M) 20 MEQ tablet Take 1 tablet (20 mEq total) by mouth 2 (two) times a week. 8 tablet 0   rifampin (RIFADIN) 300 MG capsule Take  1 capsule (300 mg total) by mouth 2 (two) times daily. 60 capsule 1   sacubitril-valsartan (ENTRESTO) 24-26 MG Take 1 tablet by mouth 2 (two) times daily. 60 tablet 1   spironolactone (ALDACTONE) 25 MG tablet Take 0.5 tablets (12.5 mg total) by mouth daily. 15 tablet 11   tamsulosin (FLOMAX) 0.4 MG CAPS capsule Take 0.4 mg by mouth daily after breakfast.     timolol (TIMOPTIC) 0.5 % ophthalmic solution Place 1 drop into both eyes in the morning.  2   tiZANidine (ZANAFLEX) 4 MG tablet Take 4 mg by mouth at bedtime.     vitamin B-12 (CYANOCOBALAMIN) 500 MCG tablet Take 500 mcg by mouth daily.     vitamin E 180 MG (400 UNITS) capsule Take 400 Units by mouth daily.     albuterol (VENTOLIN HFA) 108 (90 Base) MCG/ACT inhaler Inhale 2 puffs into the lungs every 6 (six) hours as needed for shortness of breath or wheezing. (Patient not taking: Reported on 10/30/2021)     No current facility-administered medications for this visit.     Abtx:  Anti-infectives (From admission, onward)    None       REVIEW OF SYSTEMS:  Const: negative fever, negative chills, negative weight loss Eyes: negative diplopia or visual changes, negative eye pain ENT: negative coryza, negative sore throat Resp: negative cough, hemoptysis, dyspnea Cards: negative for chest pain, palpitations, lower extremity edema GU: negative for frequency, dysuria and hematuria GI: Negative for abdominal pain, diarrhea, bleeding, constipation Skin: negative for rash and pruritus Heme: negative for easy bruising and gum/nose bleeding MS: negative for myalgias, arthralgias, back pain and muscle weakness Neurolo:negative for headaches, dizziness,  vertigo, memory problems  Psych: negative for feelings of anxiety, depression  Endocrine: negative for thyroid, diabetes Allergy/Immunology- negative for any medication or food allergies ? Pertinent Positives include : Objective:  VITALS:  BP 107/72    Pulse 84    Temp 97.9 F (36.6 C)     Resp 16    Ht '6\' 1"'  (1.854 m)    Wt 194 lb (88 kg)    BMI 25.60 kg/m  PHYSICAL EXAM:  General: Alert, cooperative, no distress, appears stated age.  Head: Normocephalic, without obvious abnormality, atraumatic. Eyes: Conjunctivae clear, anicteric sclerae. Pupils are equal ENT Nares normal. No drainage or sinus tenderness. Lips, mucosa, and tongue normal. No Thrush Neck: Supple, symmetrical, no adenopathy, thyroid: non tender no carotid bruit and no JVD. Back: No CVA tenderness. Lungs: Clear to auscultation bilaterally. No Wheezing or Rhonchi. No rales. Heart: Regular rate and rhythm, no murmur, rub or gallop. Abdomen: Soft, non-tender,not distended. Bowel sounds normal. No masses Extremities: atraumatic, no cyanosis. No edema. No clubbing Skin: No rashes or lesions. Or bruising Lymph: Cervical, supraclavicular normal. Neurologic: Grossly non-focal Pertinent Labs Lab Results CBC    Component Value Date/Time   WBC 5.4 10/03/2021 0221   RBC 3.66 (L) 10/03/2021 0221   HGB 9.0 (L) 10/03/2021 0221   HGB 12.0 (L) 11/17/2020 1135   HCT 29.3 (L) 10/03/2021 0221   HCT 37.4 (L) 11/17/2020 1135   PLT 216 10/03/2021 0221   PLT 242 11/17/2020 1135   MCV 80.1 10/03/2021 0221   MCV 81 11/17/2020 1135   MCV 88 08/17/2012 1506   MCH 24.6 (L) 10/03/2021 0221   MCHC 30.7 10/03/2021 0221   RDW 19.0 (H) 10/03/2021 0221   RDW 14.4 11/17/2020 1135   RDW 13.4 08/17/2012 1506   LYMPHSABS 1.4 08/09/2021 1231   MONOABS 1.0 08/09/2021 1231   EOSABS 0.1 08/09/2021 1231   BASOSABS 0.0 08/09/2021 1231    CMP Latest Ref Rng & Units 10/26/2021 10/19/2021 10/03/2021  Glucose 70 - 99 mg/dL 180(H) 68(L) -  BUN 8 - 23 mg/dL 43(H) 40(H) -  Creatinine 0.61 - 1.24 mg/dL 1.10 1.11 -  Sodium 135 - 145 mmol/L 130(L) 133(L) -  Potassium 3.5 - 5.1 mmol/L 3.9 4.3 -  Chloride 98 - 111 mmol/L 92(L) 97(L) -  CO2 22 - 32 mmol/L 31 28 -  Calcium 8.9 - 10.3 mg/dL 8.7(L) 8.6(L) -  Total Protein 6.5 - 8.1 g/dL - - 7.0   Total Bilirubin 0.3 - 1.2 mg/dL - - 1.4(H)  Alkaline Phos 38 - 126 U/L - - 139(H)  AST 15 - 41 U/L - - 28  ALT 0 - 44 U/L - - 14   ? Impression/Recommendation ?Staph aureus  prosthetic joint infection of the right knee. Polly exchange done Pt is on 6 weeks of IV cefazolin until 11/12/21- HE did not start rifampin because of abnormal LFTS due to CHF and nausea due to cefazolin The LFTS were improving and yesterday's labs are pending If normalized then would start rifampin. After completion of IV cefazolin he will go on po Keflex   Complicated cardiovascular history Recent high risk PCI with stenting of the LAD under Impella support on plavix/aspirin, atorvastatin and metoprolol  Congestive heart failure with EF of 25 to 30% with peripheral edema legs.managed by cardiologist, on entresto, spirinolactone, furosemide and metolazone( only for a few days)   Anemia  Bilateral lower extremity venous edema/venous stasis  History of CVA  History of PAD  History of  left TKA   Follow up 1 month ? ? ___________________________________________________ Discussed with patient,and his daughter ( by phone in detail) Note:  This document was prepared using Dragon voice recognition software and may include unintentional dictation errors.

## 2021-10-30 NOTE — Telephone Encounter (Signed)
Called and spoke to Rowena (daughter) regarding patients visit today. Advised her that once we can get results from [facility] labs then we may be able to start on Rifampin. Will call once labs come back. IV abx to complete // and picc pull after. Will then start 2 different oral abx. Will let her know once we have a plan in place.

## 2021-11-01 ENCOUNTER — Other Ambulatory Visit: Payer: Self-pay | Admitting: Infectious Diseases

## 2021-11-01 ENCOUNTER — Telehealth: Payer: Self-pay

## 2021-11-01 NOTE — Telephone Encounter (Signed)
Advised ok to start Rifampin labs look good!

## 2021-11-08 ENCOUNTER — Telehealth: Payer: Self-pay

## 2021-11-08 NOTE — Telephone Encounter (Signed)
Advised [facility] and [facility] nurse that it is ok to Leonore on Saturday 11/10/2021. Also advised [facility] that patient has been sent more meds and we need to be sure that he is not being charged for these. I also called daughter and advised her of plan to pull picc. I informed her he will remain on his Rifampin and start the antibiotic that we sent in today the day after his last IV dose. She understood.

## 2021-11-12 ENCOUNTER — Encounter: Payer: Self-pay | Admitting: Family

## 2021-11-12 ENCOUNTER — Telehealth: Payer: Self-pay | Admitting: Family

## 2021-11-12 ENCOUNTER — Other Ambulatory Visit: Payer: Self-pay

## 2021-11-12 ENCOUNTER — Other Ambulatory Visit
Admission: RE | Admit: 2021-11-12 | Discharge: 2021-11-12 | Disposition: A | Discharge status: Home / Self Care | Payer: Medicare Other | Source: Ambulatory Visit | Attending: Family | Admitting: Family

## 2021-11-12 ENCOUNTER — Ambulatory Visit (HOSPITAL_BASED_OUTPATIENT_CLINIC_OR_DEPARTMENT_OTHER): Payer: Medicare Other | Admitting: Family

## 2021-11-12 VITALS — BP 109/67 | HR 75 | Resp 16 | Ht 72.0 in | Wt 193.2 lb

## 2021-11-12 DIAGNOSIS — I5022 Chronic systolic (congestive) heart failure: Secondary | ICD-10-CM | POA: Insufficient documentation

## 2021-11-12 DIAGNOSIS — R0602 Shortness of breath: Secondary | ICD-10-CM | POA: Diagnosis not present

## 2021-11-12 DIAGNOSIS — I2510 Atherosclerotic heart disease of native coronary artery without angina pectoris: Secondary | ICD-10-CM | POA: Insufficient documentation

## 2021-11-12 DIAGNOSIS — I130 Hypertensive heart and chronic kidney disease with heart failure and stage 1 through stage 4 chronic kidney disease, or unspecified chronic kidney disease: Secondary | ICD-10-CM | POA: Diagnosis not present

## 2021-11-12 DIAGNOSIS — Z8673 Personal history of transient ischemic attack (TIA), and cerebral infarction without residual deficits: Secondary | ICD-10-CM | POA: Insufficient documentation

## 2021-11-12 DIAGNOSIS — E1122 Type 2 diabetes mellitus with diabetic chronic kidney disease: Secondary | ICD-10-CM

## 2021-11-12 DIAGNOSIS — I890 Lymphedema, not elsewhere classified: Secondary | ICD-10-CM

## 2021-11-12 DIAGNOSIS — K219 Gastro-esophageal reflux disease without esophagitis: Secondary | ICD-10-CM | POA: Insufficient documentation

## 2021-11-12 DIAGNOSIS — I25118 Atherosclerotic heart disease of native coronary artery with other forms of angina pectoris: Secondary | ICD-10-CM | POA: Diagnosis not present

## 2021-11-12 DIAGNOSIS — I255 Ischemic cardiomyopathy: Secondary | ICD-10-CM | POA: Insufficient documentation

## 2021-11-12 DIAGNOSIS — Z794 Long term (current) use of insulin: Secondary | ICD-10-CM

## 2021-11-12 DIAGNOSIS — I110 Hypertensive heart disease with heart failure: Secondary | ICD-10-CM | POA: Insufficient documentation

## 2021-11-12 DIAGNOSIS — N181 Chronic kidney disease, stage 1: Secondary | ICD-10-CM

## 2021-11-12 DIAGNOSIS — E1151 Type 2 diabetes mellitus with diabetic peripheral angiopathy without gangrene: Secondary | ICD-10-CM | POA: Insufficient documentation

## 2021-11-12 LAB — BASIC METABOLIC PANEL
Anion gap: 10 (ref 5–15)
BUN: 54 mg/dL — ABNORMAL HIGH (ref 8–23)
CO2: 30 mmol/L (ref 22–32)
Calcium: 8.6 mg/dL — ABNORMAL LOW (ref 8.9–10.3)
Chloride: 88 mmol/L — ABNORMAL LOW (ref 98–111)
Creatinine, Ser: 1.03 mg/dL (ref 0.61–1.24)
GFR, Estimated: >60 mL/min (ref >60)
Glucose, Bld: 172 mg/dL — ABNORMAL HIGH (ref 70–99)
Potassium: 2.9 mmol/L — ABNORMAL LOW (ref 3.5–5.1)
Sodium: 128 mmol/L — ABNORMAL LOW (ref 135–145)

## 2021-11-12 NOTE — Patient Instructions (Signed)
The [facility] will be moving around the corner to [id number] mid-February. Our phone number will remain the same.  Start losartan every morning (bottle with pink post-it on there).   We will call you with lab work and discuss if he can continue to take metolazone and potassium. If so, we will arrange for refills.  Return in two weeks.

## 2021-11-12 NOTE — Progress Notes (Signed)
Patient ID: Henry Lindsey MRN: [id number]; DOB: May 25, 1933   Henry Lindsey is a 88 y.o. male with a history of CAD, ICM, HFrEF, HTN, HL, DMII, GERD, CVA, PAD, and carotid arterial disease.  Echo 08/10/21: Left Ventricle: Left ventricular ejection fraction, by estimation, is 25 to 30%. The left ventricle has severely decreased function. The left ventricle demonstrates global hypokinesis. The average left ventricular global longitudinal strain is -6.3 %. The global longitudinal strain is abnormal. The left ventricular internal  cavity size was normal in size. There is no left ventricular hypertrophy. Left ventricular diastolic parameters are consistent with Grade II diastolic dysfunction (pseudonormalization).   Right Ventricle: The right ventricular size is moderately enlarged. No increase in right ventricular wall thickness. Right ventricular systolic function is moderately reduced. There is mildly elevated pulmonary artery systolic pressure. The tricuspid  regurgitant velocity is 2.58 m/s, and with an assumed right atrial  pressure of 15 mmHg, the estimated right ventricular systolic pressure is 18.8 mmHg.   LHC 08/15/21:   Mid LM to Dist LM lesion is 30% stenosed.   Ost LAD lesion is 85% stenosed.   Prox LAD lesion is 50% stenosed.   Dist LAD lesion is 35% stenosed.   Prox Cx lesion is 50% stenosed.   Prox RCA to Dist RCA lesion is 100% stenosed.   1st Diag lesion is 80% stenosed.   1st Mrg lesion is 70% stenosed.   A drug-eluting stent was successfully placed using a STENT ONYX FRONTIER 2.75X26.   Post intervention, there is a 0% residual stenosis.   Post intervention, there is a 0% residual stenosis.   Post intervention, there is a 0% residual stenosis.   Successful Impella supported high risk PCI of the ostial LAD with atherectomy and drug-eluting stent placement.  The stent extended 1 to 2 mm into the left main coronary artery to ensure coverage of the ostium.  There was normal flow  into the  left circumflex.Moderately elevated left ventricular end-diastolic pressure at 24 mmHg at the beginning of the case.  Admitted 10/01/21 due to infection in right knee with right knee replacement. Discharged after 3 days. Admitted 10/20-10/26/22 at [facility] due to progressive dyspnea, increasing abdominal girth, lower extremity edema and transferred to [facility] for high risk PCI. Successful Impella supported high risk PCI of the ostial LAD with atherectomy and drug-eluting stent placement.   He presents today for a follow-up visit with a chief complaint of minimal fatigue upon moderate exertion. He describes this as chronic in nature although feels like his energy level is improving. He has associated pedal edema, and abdominal edema. He denies any difficulty sleeping, dizziness, shortness of breath, cough, palpitations, chest pain or weight gain.   His is weighing daily. Today his weight is 193, last week it was 194. He endorses abdominal tightness over the last two days and took an extra metolazone with some relief.   Past Medical History:  Diagnosis Date   Anginal pain ([place])    Aortic atherosclerosis (HCC)    Arthritis    BPH (benign prostatic hyperplasia)    CAD (coronary artery disease)    a.) 2/22 Cath: LM 50d, LAD sev ost dz, mod-sev prox/mid dz. D1 sev dz. LCX mild prox dzs, OM2 mod-sev dz, RCA 100p. RPL and RPDA fill via L-R collats. EF 35%. Seen by CVTS->Med mgmt. b.) R/LHC 08/14/21: 30% m-d-LM, 85% oLAD, 50% pLAD, 35% dLAD, 80% D1, 50% pLCx, 70% OM1, 100% p-dRCA -> trans to [facility]. c.) 08/15/21 Impella  supported HIGH RISK PCI with atherectomy. 2.75 x 32m Onyx Frontier DES oLAD.   Carotid artery stenosis    a. 11/2010 U/S: <50% bilaterally; b. 03/2021 40-59% bilat ICA stenoses.   COPD (chronic obstructive pulmonary disease) ([place]    noted CT 10/05/16 former smoker quit age 88   CVA (cerebral infarction) 11/2010   a.) RIGHT thalamic lacunar   GERD (gastroesophageal reflux disease)     Heart murmur    HFrEF (heart failure with reduced ejection fraction) ([place]    a. 10/2020 Echo: EF 35%.   Hyperlipidemia    Hypertension    Ischemic cardiomyopathy    a. 10/2020 Echo: EF 35%. Nl RVSP. Mod MR. Mild TR; b. 11/2020 Cath:  CO/CI 5.05/2.49. LV gram: EF 35%.   Lower extremity cellulitis    NSTEMI (non-ST elevated myocardial infarction) ([place] 08/09/2021   a.) R/LHC 08/14/2021: 30% m-d-LM, 85% oLAD, 50% pLAD, 35% dLAD, 80% D1, 50% pLCx, 70% OM1, 100% p-dRCA -> transfer to [facility]. b.) 08/15/2021 Impella supported HIGH RISK PCI with atherectomy. 2.75 x 274mOnyx Frontier DES to [facility]  Peripheral vascular disease in diabetes mellitus ([place]   a. 06/2012 95% occlusion s/p PTA R SFA (Dr. DeLucky Cowboy b. 03/2021 ABIs: R 1.02, L 0.96.   Pneumonia    Type 2 diabetes mellitus [facility] with insulin [facility]   Past Surgical History:  Procedure Laterality Date   ABDOMINAL AORTOGRAM N/A 08/14/2021   Procedure: ABDOMINAL AORTOGRAM;  Surgeon: EnNelva BushMD;  [facility];  Service: Cardiovascular;  Laterality: N/A;   CHOLECYSTECTOMY N/A 11/13/2016   Procedure: LAPAROSCOPIC CHOLECYSTECTOMY WITH INTRAOPERATIVE CHOLANGIOGRAM;  Surgeon: JoOlean ReeMD;  Location: [facility] ORS;  Service: General;  Laterality: N/A;   COLONOSCOPY WITH PROPOFOL N/A 02/10/2018   Procedure: COLONOSCOPY WITH PROPOFOL;  Surgeon: WoLucilla LameMD;  Location: [facility] ENDOSCOPY;  Service: Endoscopy;  Laterality: N/A;   CORONARY ATHERECTOMY N/A 08/15/2021   Procedure: CORONARY ATHERECTOMY;  Surgeon: ArWellington HampshireMD;  [facility];  Service: Cardiovascular;  Laterality: N/A;   CORONARY STENT INTERVENTION W/IMPELLA N/A 08/15/2021   Procedure: Coronary Stent Intervention w/Impella;  Surgeon: ArWellington HampshireMD;  [facility];  Service: Cardiovascular;  Laterality: N/A;   ERCP N/A 11/17/2016   Procedure: ENDOSCOPIC RETROGRADE CHOLANGIOPANCREATOGRAPHY (ERCP);  Surgeon: JoIrene ShipperMD;   [facility];  Service: Endoscopy;  Laterality: N/A;   ERCP N/A 02/04/2017   Procedure: ENDOSCOPIC RETROGRADE CHOLANGIOPANCREATOGRAPHY (ERCP) Stent removal;  Surgeon: DaLucilla LameMD;  Location: [facility];  Service: Endoscopy;  Laterality: N/A;   IR GENERIC HISTORICAL  10/06/2016   IR PERC CHOLECYSTOSTOMY 10/06/2016 GlAletta EdouardMD [facility]-INTERV RAD   JOINT REPLACEMENT Left 2014   left knee   RIGHT/LEFT HEART CATH AND CORONARY ANGIOGRAPHY N/A 11/24/2020   Procedure: RIGHT/LEFT HEART CATH AND CORONARY ANGIOGRAPHY;  Surgeon: GoMinna MerrittsMD;  [place];  Service: Cardiovascular;  Laterality: N/A;   RIGHT/LEFT HEART CATH AND CORONARY ANGIOGRAPHY N/A 08/14/2021   Procedure: RIGHT/LEFT HEART CATH AND CORONARY ANGIOGRAPHY;  Surgeon: EnNelva BushMD;  [facility];  Service: Cardiovascular;  Laterality: N/A;   TOTAL KNEE ARTHROPLASTY Right 08/08/2020   Procedure: TOTAL KNEE ARTHROPLASTY;  Surgeon: BoLovell SheehanMD;  Location: [facility];  Service: Orthopedics;  Laterality: Right;   TOTAL KNEE REVISION Right 10/01/2021   Procedure: IRRIGATION AND DEBRIDMENT RIGHT KNEE WITH POLY EXCHANGE;  Surgeon: BoLovell SheehanMD;  Location: [facility] ORS;  Service: Orthopedics;  Laterality: Right;   UPPER GASTROINTESTINAL ENDOSCOPY     WRIST SURGERY     Family History  Problem Relation Age of Onset   Diabetes Mother    Social History   Tobacco Use   Smoking status: Former    Packs/day: 0.50    Years: 33.00    Pack years: 16.50    Types: Cigarettes    Quit date: 04/21/1968    Years since quitting: 53.5   Smokeless tobacco: Never  Substance Use Topics   Alcohol use: No   Allergies  Allergen Reactions   Jardiance [Empagliflozin]     Rash all over and lips turn purple.    Metformin And Related Diarrhea   Trazodone Other (See Comments)    Extreme hallucinations/ psychosis    Torsemide Rash    "Skin reaction"   Prior to Admission medications    Medication Sig Start Date End Date Taking? Authorizing Provider  ascorbic acid (VITAMIN C) 500 MG tablet Take 500 mg by mouth daily.    Yes [provider]  aspirin EC 81 MG tablet Take 1 tablet (81 mg total) by mouth daily. Swallow whole. 01/31/21  Yes Minna Merritts, MD  atorvastatin (LIPITOR) 40 MG tablet Take 1 tablet (40 mg total) by mouth daily. Patient taking differently: Take 40 mg by mouth in the morning. 12/08/20  Yes Gollan, Kathlene November, MD  Blood Glucose Monitoring Suppl (ONE TOUCH ULTRA SYSTEM KIT) w/Device KIT 1 kit by Does not apply route once. Use DX code E11.59 10/18/15  Yes [name], MD  ceFAZolin (ANCEF) IVPB Inject 2 g into the vein every 8 (eight) hours. Indication:  MSSA prosthetic joint infection of knee First Dose: Yes Last Day of Therapy:  11/12/2021 Labs - Once weekly on MONDAYS:  CBC/D, CMP, ESR and CRP Please pull PIC at completion of IV antibiotics Fax weekly labs to [phone] Method of administration: IV Push Method of administration may be changed at the discretion of home infusion pharmacist based upon assessment of the patient and/or caregiver's ability to self-administer the medication ordered. 10/04/21 11/12/21 Yes Carlynn Spry, PA-C  cholecalciferol (VITAMIN D) 25 MCG (1000 UNIT) tablet Take 1,000 Units by mouth daily.   Yes [provider]  clopidogrel (PLAVIX) 75 MG tablet TAKE 1 TABLET(75 MG) BY MOUTH DAILY WITH BREAKFAST 09/11/21  Yes Gollan, Kathlene November, MD  cyclobenzaprine (FLEXERIL) 5 MG tablet Take 5-10 mg by mouth daily as needed for muscle spasms. 07/31/21  Yes [provider]  diclofenac Sodium (VOLTAREN) 1 % GEL Apply 2 g topically 2 (two) times daily as needed (pain).   Yes [provider]  docusate sodium (COLACE) 100 MG capsule Take 1 capsule (100 mg total) by mouth 2 (two) times daily. 10/04/21  Yes Carlynn Spry, PA-C  furosemide (LASIX) 40 MG tablet Take 1 tablet (40 mg total) by mouth 2 (two)  times daily. 10/19/21 01/17/22 Yes Hackney, Otila Kluver A, FNP  gabapentin (NEURONTIN) 400 MG capsule Take 400 mg by mouth 2 (two) times daily.   Yes [provider]  glipiZIDE (GLUCOTROL) 10 MG tablet Take 10 mg by mouth daily before breakfast.   Yes [provider]  HYDROcodone-acetaminophen (NORCO/VICODIN) 5-325 MG tablet Take 1 tablet by mouth every 4 (four) hours as needed for moderate pain (pain score 4-6). 10/04/21  Yes Carlynn Spry, PA-C  INS SYRINGE/NEEDLE 1CC/28G (B-D INSULIN SYRINGE 1CC/28G) 28G X 1/2" 1 ML MISC USE TO ADMINISTER INSULIN DAILY  02/13/16  Yes [name], MD  insulin aspart protamine- aspart (NOVOLOG MIX 70/30) (70-30) 100 UNIT/ML injection Inject 0.15 mLs (15 Units total) into the skin 2 (two) times daily with a meal. 08/14/21  Yes Fritzi Mandes, MD  latanoprost (XALATAN) 0.005 % ophthalmic solution Place 1 drop into both eyes at bedtime. 11/01/15  Yes [provider]  metolazone (ZAROXOLYN) 2.5 MG tablet Take 1 tablet (2.5 mg total) by mouth 2 (two) times a week. Take this 1/2 hour before furosemide 10/29/21 01/27/22 Yes Hackney, Tina A, FNP  metoprolol succinate (TOPROL-XL) 50 MG 24 hr tablet Take 1 tablet (50 mg total) by mouth daily. Take with or immediately following a meal. 08/21/21  Yes Bhagat, Bhavinkumar, PA  Multiple Vitamin (MULTIVITAMIN WITH MINERALS) TABS tablet Take 1 tablet by mouth in the morning.   Yes [provider]  oxyCODONE (OXY IR/ROXICODONE) 5 MG immediate release tablet Take 5 mg by mouth every 4 (four) hours as needed for severe pain.   Yes [provider]  pantoprazole (PROTONIX) 40 MG tablet Take 40 mg by mouth daily.   Yes [provider]  potassium chloride SA (KLOR-CON M) 20 MEQ tablet Take 1 tablet (20 mEq total) by mouth 2 (two) times a week. 10/29/21  Yes Darylene Price A, FNP  tamsulosin (FLOMAX) 0.4 MG CAPS capsule Take 0.4 mg by mouth daily after breakfast.   Yes [provider]  timolol  (TIMOPTIC) 0.5 % ophthalmic solution Place 1 drop into both eyes in the morning. 12/19/17  Yes [provider]  vitamin B-12 (CYANOCOBALAMIN) 500 MCG tablet Take 500 mcg by mouth daily.   Yes [provider]  vitamin E 180 MG (400 UNITS) capsule Take 400 Units by mouth daily.   Yes [provider]  albuterol (VENTOLIN HFA) 108 (90 Base) MCG/ACT inhaler Inhale 2 puffs into the lungs every 6 (six) hours as needed for shortness of breath or wheezing. Patient not taking: Reported on 10/30/2021 10/29/20   [provider]  nitroGLYCERIN (NITROSTAT) 0.4 MG SL tablet Place 1 tablet (0.4 mg total) under the tongue every 5 (five) minutes as needed for chest pain. MAXIMUM 3 TABLETS Patient not taking: Reported on 10/30/2021 12/08/20   Minna Merritts, MD  ONE [facility] ULTRA TEST test strip USE 1 STRIP TO TEST THREE TIMES DAILY AS DIRECTED 08/31/18   [name], MD  [facility] DELICA LANCETS 14H MISC Use three times daily to check blood sugar. 10/18/15   [name], MD  rifampin (RIFADIN) 300 MG capsule Take 1 capsule (300 mg total) by mouth 2 (two) times daily. Patient not taking: Reported on 10/23/2021 10/04/21 11/12/21  Carlynn Spry, PA-C  sacubitril-valsartan (ENTRESTO) 24-26 MG Take 1 tablet by mouth 2 (two) times daily. Patient not taking: Reported on 10/05/2021 09/25/21 11/24/21  Furth, Cadence H, PA-C  spironolactone (ALDACTONE) 25 MG tablet Take 0.5 tablets (12.5 mg total) by mouth daily. Patient not taking: Reported on 10/23/2021 08/21/21   Leanor Kail, PA  tiZANidine (ZANAFLEX) 4 MG tablet Take 4 mg by mouth at bedtime. 10/29/21   [provider]    Review of Systems  Constitutional:  Positive for malaise/fatigue. Negative for weight loss.  HENT: Negative.    Eyes: Negative.   Respiratory:  Negative for cough, shortness of breath and wheezing.   Cardiovascular:  Positive for leg swelling. Negative for chest pain, palpitations and orthopnea.   Gastrointestinal:  Positive for constipation. Negative for diarrhea and nausea.  Genitourinary: Negative.  Musculoskeletal: Negative.  Negative for falls.  Skin: Negative.   Neurological:  Negative for dizziness, weakness and headaches.  Endo/Heme/Allergies: Negative.   Psychiatric/Behavioral:  The patient is not nervous/anxious and does not have insomnia.     Vitals:   11/12/21 1332  BP: 109/67  Pulse: 75  Resp: 16  SpO2: 96%  Weight: 193 lb 4 oz (87.7 kg)  Height: 6' (1.829 m)   Wt Readings from Last 3 Encounters:  11/12/21 193 lb 4 oz (87.7 kg)  10/30/21 194 lb (88 kg)  10/30/21 194 lb 2 oz (88.1 kg)   Lab Results  Component Value Date   CREATININE 1.10 10/26/2021   CREATININE 1.11 10/19/2021   CREATININE 1.15 10/02/2021   Physical Exam Vitals and nursing note reviewed. Exam conducted with a chaperone present (girlfriend Henry Lindsey)).  Constitutional:      General: He is not in acute distress.    Appearance: He is not ill-appearing.  Neck:     Vascular: No carotid bruit.  Cardiovascular:     Rate and Rhythm: Normal rate and regular rhythm.     Heart sounds: Normal heart sounds.  Pulmonary:     Effort: No respiratory distress.     Breath sounds: No wheezing or rales.  Abdominal:     General: There is no distension.     Palpations: Abdomen is soft.  Musculoskeletal:        General: No tenderness.     Right lower leg: Edema (1+ pitting) present.     Left lower leg: Edema (1+ pitting) present.  Skin:    General: Skin is warm and dry.  Neurological:     Mental Status: He is alert and oriented to person, place, and time.  Psychiatric:        Mood and Affect: Mood normal.        Thought Content: Thought content normal.    Assessment & Plan: 1: Chronic Heart failure with reduced ejection fraction- - NYHA II - slightly volume overloaded, he took an additional metolazone yesterday and advised his abdominal tightness is decreasing - weighing daily; reminded to call  for an overnight weight gain of > 2 pounds or a weekly weight gain of > 5 pounds - weight today 193, last weight here 2 weeks ago was 194 pounds - on GDMT of metoprolol succinate  - added 12.90m losartan at last visit, but there was some confusion as his daughter (who fills his pill boxes) thought it was an additional bottle of potassium so he never started taking it - will start losartan 12.5 daily starting tomorrow morning - Entresto previously stopped due to hypotension - allergic to Jardiance - BP historically soft; today is 109/67 - O2 @ 2L QHS and PRN during the day; they are to follow-up with PCP regarding O2 order - BNP 10/19/21 was 2372.6 - check BMP today  2.  CAD- - DES to ostial LAD - ASA, plavix - saw cardiology (Kathlen Mody 09/25/21  3.  DM- - A1c 09/26/21 was 7.0% - BMP 10/26/21 reviewed and showed sodium 130, potassium 3.9, creatinine 1.10 and GFR >60 - saw PCP (Edwina Barth 07/11/21  4. Lymphedema - stage 2 - limited in his ability to exercise due to his shortness of breath - Shown Dissinger appearance but no open wounds - does not have compression socks on;  he advises they "tear his legs up" and leave open sores - consider compression boots if edema persists   Medication list reviewed  Return in 2 weeks, sooner  if needed for any questions/problems.

## 2021-11-12 NOTE — Telephone Encounter (Signed)
Spoke with patient regarding BMP results obtained earlier today. Renal function looks good although potassium is low at 2.9.   He's currently taking metolazone 2.5mg  and potassium 40meq twice a week. We resumed 12.5mg  losartan today.   Advised patient to take 67meq potassium tonight and that he could take 1 and wait an hour and take another until he takes all 3. Advised him that we would call him back tomorrow with a chronic plan to address his hypokalemia.   Patient verbalized understanding.

## 2021-11-13 ENCOUNTER — Telehealth: Payer: Self-pay | Admitting: Family

## 2021-11-13 MED ORDER — METOLAZONE 2.5 MG PO TABS: 2.5000 mg | ORAL_TABLET | ORAL | 5 refills | Status: DC

## 2021-11-13 MED ORDER — POTASSIUM CHLORIDE CRYS ER 20 MEQ PO TBCR: 20.0000 meq | EXTENDED_RELEASE_TABLET | Freq: Two times a day (BID) | ORAL | 5 refills | Status: DC

## 2021-11-13 NOTE — Telephone Encounter (Signed)
Spoke with patient regarding long-term treatment of his potassium level. He said that he did take the 3 extra potassium tablets last night and says that he felt bloated after taking them which interrupted his sleep but that the bloating has resolved and he's feeling better.   Advised that I would be sending a prescription in for potassium 64meq BID that he will take with his lasix. Will not have him take any additional potassium when he takes the twice weekly metolazone.  Patient verbalized understanding but I also said that I would call his daughter, Henry Lindsey, to relay the information.   Will check his labs at his next appointment in 2 weeks.

## 2021-11-14 ENCOUNTER — Other Ambulatory Visit: Payer: Self-pay | Admitting: Family

## 2021-11-14 ENCOUNTER — Telehealth: Payer: Self-pay | Admitting: Family

## 2021-11-14 DIAGNOSIS — I5023 Acute on chronic systolic (congestive) heart failure: Secondary | ICD-10-CM

## 2021-11-14 NOTE — Telephone Encounter (Addendum)
Patient called saying that he continues to feel bloated although says that he feels better now than earlier in the day. He mentions that he's been having 4-5 "normal BM's daily". Drinking ~ 64 ounces of fluid daily although mentions that he doesn't think he's urinating as much as he should. Says that his breathing and pedal edema are normal. Weight up 1 pounds in the last couple of days. Reports some dizziness and sense of being unsteady.   Did take the metolazone today (takes it twice a week) and has been taking his potassium as 48meq BID. Recently started on a new oral antibiotic (rifampin) so isn't sure if that's part of all of this as well.   Will bring patient in for 80mg  IV lasix/ 86meq PO potassium tomorrow afternoon at 1pm. Patient and daughter made aware of time. Advised him that should his symptoms worsen, he can always present to the ED and that if the IV lasix this time doesn't work, he may still have to go to the ED to be admitted.   Patient and daughter verbalized understanding.

## 2021-11-15 ENCOUNTER — Other Ambulatory Visit: Admission: RE | Admit: 2021-11-15 | Payer: Medicare Other | Source: Ambulatory Visit

## 2021-11-15 ENCOUNTER — Encounter: Payer: Self-pay | Admitting: Physician Assistant

## 2021-11-15 ENCOUNTER — Ambulatory Visit (INDEPENDENT_AMBULATORY_CARE_PROVIDER_SITE_OTHER): Payer: Medicare Other | Admitting: Physician Assistant

## 2021-11-15 ENCOUNTER — Emergency Department: Payer: Medicare Other

## 2021-11-15 ENCOUNTER — Other Ambulatory Visit: Payer: Self-pay

## 2021-11-15 ENCOUNTER — Other Ambulatory Visit: Payer: Self-pay | Admitting: Infectious Diseases

## 2021-11-15 ENCOUNTER — Ambulatory Visit
Admission: RE | Admit: 2021-11-15 | Discharge: 2021-11-15 | Disposition: A | Discharge status: Home / Self Care | Payer: Medicare Other | Source: Ambulatory Visit | Attending: Family | Admitting: Family

## 2021-11-15 ENCOUNTER — Telehealth: Payer: Self-pay | Admitting: Family

## 2021-11-15 ENCOUNTER — Inpatient Hospital Stay
Admission: EM | Admit: 2021-11-15 | Discharge: 2021-11-21 | DRG: 291 | Disposition: A | Discharge status: Home / Self Care | Payer: Medicare Other | Attending: Obstetrics and Gynecology | Admitting: Obstetrics and Gynecology

## 2021-11-15 VITALS — BP 100/54 | HR 75 | Ht 71.0 in | Wt 198.2 lb

## 2021-11-15 DIAGNOSIS — J9811 Atelectasis: Secondary | ICD-10-CM | POA: Diagnosis present

## 2021-11-15 DIAGNOSIS — Z955 Presence of coronary angioplasty implant and graft: Secondary | ICD-10-CM

## 2021-11-15 DIAGNOSIS — R0602 Shortness of breath: Secondary | ICD-10-CM | POA: Diagnosis present

## 2021-11-15 DIAGNOSIS — Y831 Surgical operation with implant of artificial internal device as the cause of abnormal reaction of the patient, or of later complication, without mention of misadventure at the time of the procedure: Secondary | ICD-10-CM | POA: Diagnosis present

## 2021-11-15 DIAGNOSIS — I255 Ischemic cardiomyopathy: Secondary | ICD-10-CM

## 2021-11-15 DIAGNOSIS — Z66 Do not resuscitate: Secondary | ICD-10-CM | POA: Diagnosis present

## 2021-11-15 DIAGNOSIS — Z794 Long term (current) use of insulin: Secondary | ICD-10-CM

## 2021-11-15 DIAGNOSIS — I5043 Acute on chronic combined systolic (congestive) and diastolic (congestive) heart failure: Secondary | ICD-10-CM | POA: Diagnosis present

## 2021-11-15 DIAGNOSIS — I779 Disorder of arteries and arterioles, unspecified: Secondary | ICD-10-CM

## 2021-11-15 DIAGNOSIS — T8453XA Infection and inflammatory reaction due to internal right knee prosthesis, initial encounter: Secondary | ICD-10-CM

## 2021-11-15 DIAGNOSIS — I5023 Acute on chronic systolic (congestive) heart failure: Secondary | ICD-10-CM

## 2021-11-15 DIAGNOSIS — E1129 Type 2 diabetes mellitus with other diabetic kidney complication: Secondary | ICD-10-CM | POA: Diagnosis present

## 2021-11-15 DIAGNOSIS — I5021 Acute systolic (congestive) heart failure: Secondary | ICD-10-CM | POA: Diagnosis not present

## 2021-11-15 DIAGNOSIS — E8809 Other disorders of plasma-protein metabolism, not elsewhere classified: Secondary | ICD-10-CM | POA: Diagnosis present

## 2021-11-15 DIAGNOSIS — D649 Anemia, unspecified: Secondary | ICD-10-CM

## 2021-11-15 DIAGNOSIS — N183 Chronic kidney disease, stage 3 unspecified: Secondary | ICD-10-CM | POA: Diagnosis present

## 2021-11-15 DIAGNOSIS — I429 Cardiomyopathy, unspecified: Secondary | ICD-10-CM

## 2021-11-15 DIAGNOSIS — Z888 Allergy status to other drugs, medicaments and biological substances status: Secondary | ICD-10-CM

## 2021-11-15 DIAGNOSIS — Z7902 Long term (current) use of antithrombotics/antiplatelets: Secondary | ICD-10-CM

## 2021-11-15 DIAGNOSIS — I272 Pulmonary hypertension, unspecified: Secondary | ICD-10-CM

## 2021-11-15 DIAGNOSIS — Z20822 Contact with and (suspected) exposure to covid-19: Secondary | ICD-10-CM | POA: Diagnosis present

## 2021-11-15 DIAGNOSIS — Z7984 Long term (current) use of oral hypoglycemic drugs: Secondary | ICD-10-CM

## 2021-11-15 DIAGNOSIS — I130 Hypertensive heart and chronic kidney disease with heart failure and stage 1 through stage 4 chronic kidney disease, or unspecified chronic kidney disease: Principal | ICD-10-CM | POA: Diagnosis present

## 2021-11-15 DIAGNOSIS — J918 Pleural effusion in other conditions classified elsewhere: Secondary | ICD-10-CM | POA: Diagnosis present

## 2021-11-15 DIAGNOSIS — Z8673 Personal history of transient ischemic attack (TIA), and cerebral infarction without residual deficits: Secondary | ICD-10-CM

## 2021-11-15 DIAGNOSIS — N138 Other obstructive and reflux uropathy: Secondary | ICD-10-CM | POA: Diagnosis present

## 2021-11-15 DIAGNOSIS — E1151 Type 2 diabetes mellitus with diabetic peripheral angiopathy without gangrene: Secondary | ICD-10-CM | POA: Diagnosis present

## 2021-11-15 DIAGNOSIS — I2582 Chronic total occlusion of coronary artery: Secondary | ICD-10-CM | POA: Diagnosis present

## 2021-11-15 DIAGNOSIS — N401 Enlarged prostate with lower urinary tract symptoms: Secondary | ICD-10-CM | POA: Diagnosis present

## 2021-11-15 DIAGNOSIS — Z8701 Personal history of pneumonia (recurrent): Secondary | ICD-10-CM

## 2021-11-15 DIAGNOSIS — N179 Acute kidney failure, unspecified: Secondary | ICD-10-CM | POA: Diagnosis present

## 2021-11-15 DIAGNOSIS — D509 Iron deficiency anemia, unspecified: Secondary | ICD-10-CM | POA: Diagnosis present

## 2021-11-15 DIAGNOSIS — M1990 Unspecified osteoarthritis, unspecified site: Secondary | ICD-10-CM | POA: Diagnosis present

## 2021-11-15 DIAGNOSIS — I252 Old myocardial infarction: Secondary | ICD-10-CM

## 2021-11-15 DIAGNOSIS — I2510 Atherosclerotic heart disease of native coronary artery without angina pectoris: Secondary | ICD-10-CM | POA: Diagnosis present

## 2021-11-15 DIAGNOSIS — E785 Hyperlipidemia, unspecified: Secondary | ICD-10-CM

## 2021-11-15 DIAGNOSIS — I493 Ventricular premature depolarization: Secondary | ICD-10-CM | POA: Diagnosis present

## 2021-11-15 DIAGNOSIS — Z9981 Dependence on supplemental oxygen: Secondary | ICD-10-CM

## 2021-11-15 DIAGNOSIS — J9611 Chronic respiratory failure with hypoxia: Secondary | ICD-10-CM | POA: Diagnosis present

## 2021-11-15 DIAGNOSIS — R011 Cardiac murmur, unspecified: Secondary | ICD-10-CM | POA: Diagnosis present

## 2021-11-15 DIAGNOSIS — Z7982 Long term (current) use of aspirin: Secondary | ICD-10-CM

## 2021-11-15 DIAGNOSIS — Z79899 Other long term (current) drug therapy: Secondary | ICD-10-CM

## 2021-11-15 DIAGNOSIS — R140 Abdominal distension (gaseous): Secondary | ICD-10-CM

## 2021-11-15 DIAGNOSIS — J90 Pleural effusion, not elsewhere classified: Secondary | ICD-10-CM

## 2021-11-15 DIAGNOSIS — J449 Chronic obstructive pulmonary disease, unspecified: Secondary | ICD-10-CM | POA: Diagnosis present

## 2021-11-15 DIAGNOSIS — Z833 Family history of diabetes mellitus: Secondary | ICD-10-CM

## 2021-11-15 DIAGNOSIS — K219 Gastro-esophageal reflux disease without esophagitis: Secondary | ICD-10-CM | POA: Diagnosis present

## 2021-11-15 DIAGNOSIS — E1122 Type 2 diabetes mellitus with diabetic chronic kidney disease: Secondary | ICD-10-CM | POA: Diagnosis present

## 2021-11-15 DIAGNOSIS — I739 Peripheral vascular disease, unspecified: Secondary | ICD-10-CM

## 2021-11-15 DIAGNOSIS — Z87891 Personal history of nicotine dependence: Secondary | ICD-10-CM

## 2021-11-15 DIAGNOSIS — I509 Heart failure, unspecified: Secondary | ICD-10-CM

## 2021-11-15 DIAGNOSIS — N1831 Chronic kidney disease, stage 3a: Secondary | ICD-10-CM | POA: Diagnosis present

## 2021-11-15 DIAGNOSIS — E871 Hypo-osmolality and hyponatremia: Secondary | ICD-10-CM | POA: Diagnosis present

## 2021-11-15 DIAGNOSIS — I700 Atherosclerosis of aorta: Secondary | ICD-10-CM | POA: Diagnosis present

## 2021-11-15 DIAGNOSIS — E878 Other disorders of electrolyte and fluid balance, not elsewhere classified: Secondary | ICD-10-CM

## 2021-11-15 DIAGNOSIS — I10 Essential (primary) hypertension: Secondary | ICD-10-CM

## 2021-11-15 HISTORY — DX: Heart failure, unspecified: I50.9

## 2021-11-15 LAB — CBC
HCT: 37.2 % — ABNORMAL LOW (ref 39.0–52.0)
Hemoglobin: 10.8 g/dL — ABNORMAL LOW (ref 13.0–17.0)
MCH: 23.7 pg — ABNORMAL LOW (ref 26.0–34.0)
MCHC: 29 g/dL — ABNORMAL LOW (ref 30.0–36.0)
MCV: 81.6 fL (ref 80.0–100.0)
Platelets: 163 10*3/uL (ref 150–400)
RBC: 4.56 MIL/uL (ref 4.22–5.81)
RDW: 18.7 % — ABNORMAL HIGH (ref 11.5–15.5)
WBC: 5.9 10*3/uL (ref 4.0–10.5)
nRBC: 0 % (ref 0.0–0.2)

## 2021-11-15 LAB — CBG MONITORING, ED: Glucose-Capillary: 154 mg/dL — ABNORMAL HIGH (ref 70–99)

## 2021-11-15 LAB — BASIC METABOLIC PANEL
Anion gap: 11 (ref 5–15)
Anion gap: 11 (ref 5–15)
BUN: 56 mg/dL — ABNORMAL HIGH (ref 8–23)
BUN: 54 mg/dL — ABNORMAL HIGH (ref 8–23)
CO2: 29 mmol/L (ref 22–32)
CO2: 28 mmol/L (ref 22–32)
Calcium: 8.8 mg/dL — ABNORMAL LOW (ref 8.9–10.3)
Calcium: 9.1 mg/dL (ref 8.9–10.3)
Chloride: 85 mmol/L — ABNORMAL LOW (ref 98–111)
Chloride: 85 mmol/L — ABNORMAL LOW (ref 98–111)
Creatinine, Ser: 1 mg/dL (ref 0.61–1.24)
Creatinine, Ser: 0.94 mg/dL (ref 0.61–1.24)
GFR, Estimated: >60 mL/min (ref >60)
GFR, Estimated: >60 mL/min (ref >60)
Glucose, Bld: 71 mg/dL (ref 70–99)
Glucose, Bld: 138 mg/dL — ABNORMAL HIGH (ref 70–99)
Potassium: 3.4 mmol/L — ABNORMAL LOW (ref 3.5–5.1)
Potassium: 3.5 mmol/L (ref 3.5–5.1)
Sodium: 125 mmol/L — ABNORMAL LOW (ref 135–145)
Sodium: 124 mmol/L — ABNORMAL LOW (ref 135–145)

## 2021-11-15 LAB — BRAIN NATRIURETIC PEPTIDE
B Natriuretic Peptide: 2665.8 pg/mL — ABNORMAL HIGH (ref 0.0–100.0)
B Natriuretic Peptide: 2065.7 pg/mL — ABNORMAL HIGH (ref 0.0–100.0)

## 2021-11-15 LAB — TROPONIN I (HIGH SENSITIVITY)
Troponin I (High Sensitivity): 28 ng/L — ABNORMAL HIGH (ref <18)
Troponin I (High Sensitivity): 30 ng/L — ABNORMAL HIGH (ref <18)

## 2021-11-15 LAB — RESP PANEL BY RT-PCR (FLU A&B, COVID) ARPGX2
Influenza A by PCR: NEGATIVE
Influenza B by PCR: NEGATIVE
SARS Coronavirus 2 by RT PCR: NEGATIVE

## 2021-11-15 LAB — MAGNESIUM: Magnesium: 2 mg/dL (ref 1.7–2.4)

## 2021-11-15 MED ORDER — SPIRONOLACTONE 25 MG PO TABS
12.5000 mg | ORAL_TABLET | Freq: Every day | ORAL | Status: DC
Start: 2021-11-15 — End: 2021-11-21
  Administered 2021-11-15 – 2021-11-21 (×7): 12.5 mg via ORAL
  Filled 2021-11-15 (×2): qty 0.5
  Filled 2021-11-15: qty 1
  Filled 2021-11-15: qty 0.5
  Filled 2021-11-15: qty 1
  Filled 2021-11-15 (×2): qty 0.5
  Filled 2021-11-15 (×2): qty 1
  Filled 2021-11-15: qty 0.5
  Filled 2021-11-15: qty 1
  Filled 2021-11-15: qty 0.5

## 2021-11-15 MED ORDER — NITROGLYCERIN 0.4 MG SL SUBL: 0.4000 mg | SUBLINGUAL_TABLET | SUBLINGUAL | Status: DC | PRN

## 2021-11-15 MED ORDER — FUROSEMIDE 10 MG/ML IJ SOLN: 80.0000 mg | Freq: Once | INTRAMUSCULAR | Status: AC

## 2021-11-15 MED ORDER — ASCORBIC ACID 500 MG PO TABS
500.0000 mg | ORAL_TABLET | Freq: Every day | ORAL | Status: DC
  Administered 2021-11-16 – 2021-11-21 (×6): 500 mg via ORAL
  Filled 2021-11-15 (×6): qty 1

## 2021-11-15 MED ORDER — ALBUTEROL SULFATE HFA 108 (90 BASE) MCG/ACT IN AERS: 2.0000 | INHALATION_SPRAY | Freq: Four times a day (QID) | RESPIRATORY_TRACT | Status: DC | PRN

## 2021-11-15 MED ORDER — INSULIN ASPART 100 UNIT/ML IJ SOLN
0.0000 [IU] | Freq: Three times a day (TID) | INTRAMUSCULAR | Status: DC
  Administered 2021-11-16: 2 [IU] via SUBCUTANEOUS
  Administered 2021-11-16 – 2021-11-18 (×4): 3 [IU] via SUBCUTANEOUS
  Administered 2021-11-19: 2 [IU] via SUBCUTANEOUS
  Administered 2021-11-19: 3 [IU] via SUBCUTANEOUS
  Administered 2021-11-19 – 2021-11-20 (×2): 2 [IU] via SUBCUTANEOUS
  Administered 2021-11-20 – 2021-11-21 (×2): 3 [IU] via SUBCUTANEOUS
  Filled 2021-11-15 (×11): qty 1

## 2021-11-15 MED ORDER — TIZANIDINE HCL 4 MG PO TABS
4.0000 mg | ORAL_TABLET | Freq: Every day | ORAL | Status: DC
  Administered 2021-11-15 – 2021-11-16 (×2): 4 mg via ORAL
  Filled 2021-11-15 (×2): qty 1
  Filled 2021-11-15: qty 2

## 2021-11-15 MED ORDER — ADULT MULTIVITAMIN W/MINERALS CH
1.0000 | ORAL_TABLET | Freq: Every morning | ORAL | Status: DC
  Administered 2021-11-17 – 2021-11-21 (×5): 1 via ORAL
  Filled 2021-11-15 (×3): qty 1

## 2021-11-15 MED ORDER — GABAPENTIN 400 MG PO CAPS
400.0000 mg | ORAL_CAPSULE | Freq: Two times a day (BID) | ORAL | Status: DC
  Administered 2021-11-15 – 2021-11-17 (×4): 400 mg via ORAL
  Filled 2021-11-15 (×4): qty 1

## 2021-11-15 MED ORDER — FUROSEMIDE 10 MG/ML IJ SOLN
INTRAMUSCULAR | Status: AC
  Administered 2021-11-15: 80 mg via INTRAVENOUS
  Filled 2021-11-15: qty 8

## 2021-11-15 MED ORDER — ALBUTEROL SULFATE (2.5 MG/3ML) 0.083% IN NEBU: 2.5000 mg | INHALATION_SOLUTION | Freq: Four times a day (QID) | RESPIRATORY_TRACT | Status: DC | PRN

## 2021-11-15 MED ORDER — ASPIRIN EC 81 MG PO TBEC
81.0000 mg | DELAYED_RELEASE_TABLET | Freq: Every day | ORAL | Status: DC
  Administered 2021-11-16 – 2021-11-21 (×6): 81 mg via ORAL
  Filled 2021-11-15 (×6): qty 1

## 2021-11-15 MED ORDER — FUROSEMIDE 10 MG/ML IJ SOLN
40.0000 mg | Freq: Two times a day (BID) | INTRAMUSCULAR | Status: DC
  Administered 2021-11-16: 40 mg via INTRAVENOUS
  Filled 2021-11-15: qty 4

## 2021-11-15 MED ORDER — ONDANSETRON HCL 4 MG PO TABS: 4.0000 mg | ORAL_TABLET | Freq: Four times a day (QID) | ORAL | Status: DC | PRN

## 2021-11-15 MED ORDER — ACETAMINOPHEN 325 MG PO TABS: 650.0000 mg | ORAL_TABLET | Freq: Four times a day (QID) | ORAL | Status: DC | PRN

## 2021-11-15 MED ORDER — DOCUSATE SODIUM 100 MG PO CAPS
100.0000 mg | ORAL_CAPSULE | Freq: Two times a day (BID) | ORAL | Status: DC
  Administered 2021-11-15 – 2021-11-21 (×11): 100 mg via ORAL
  Filled 2021-11-15 (×12): qty 1

## 2021-11-15 MED ORDER — LATANOPROST 0.005 % OP SOLN
1.0000 [drp] | Freq: Every day | OPHTHALMIC | Status: DC
  Administered 2021-11-16 – 2021-11-20 (×5): 1 [drp] via OPHTHALMIC
  Filled 2021-11-15: qty 2.5

## 2021-11-15 MED ORDER — VITAMIN B-12 1000 MCG PO TABS
500.0000 ug | ORAL_TABLET | Freq: Every day | ORAL | Status: DC
  Administered 2021-11-16 – 2021-11-21 (×6): 500 ug via ORAL
  Filled 2021-11-15 (×6): qty 1

## 2021-11-15 MED ORDER — POTASSIUM CHLORIDE CRYS ER 20 MEQ PO TBCR
EXTENDED_RELEASE_TABLET | ORAL | Status: AC
  Administered 2021-11-15: 40 meq via ORAL
  Filled 2021-11-15: qty 2

## 2021-11-15 MED ORDER — ENOXAPARIN SODIUM 40 MG/0.4ML IJ SOSY
40.0000 mg | PREFILLED_SYRINGE | INTRAMUSCULAR | Status: DC
  Administered 2021-11-15 – 2021-11-20 (×5): 40 mg via SUBCUTANEOUS
  Filled 2021-11-15 (×5): qty 0.4

## 2021-11-15 MED ORDER — POTASSIUM CHLORIDE CRYS ER 20 MEQ PO TBCR
20.0000 meq | EXTENDED_RELEASE_TABLET | Freq: Two times a day (BID) | ORAL | Status: DC
  Administered 2021-11-15 – 2021-11-21 (×11): 20 meq via ORAL
  Filled 2021-11-15 (×11): qty 1

## 2021-11-15 MED ORDER — TIMOLOL MALEATE 0.5 % OP SOLN
1.0000 [drp] | Freq: Every day | OPHTHALMIC | Status: DC
  Administered 2021-11-16 – 2021-11-21 (×6): 1 [drp] via OPHTHALMIC
  Filled 2021-11-15: qty 5

## 2021-11-15 MED ORDER — HYDROCODONE-ACETAMINOPHEN 5-325 MG PO TABS
1.0000 | ORAL_TABLET | ORAL | Status: DC | PRN
  Administered 2021-11-16 – 2021-11-21 (×15): 1 via ORAL
  Filled 2021-11-15 (×16): qty 1

## 2021-11-15 MED ORDER — PANTOPRAZOLE SODIUM 40 MG PO TBEC
40.0000 mg | DELAYED_RELEASE_TABLET | Freq: Every day | ORAL | Status: DC
  Administered 2021-11-16 – 2021-11-21 (×6): 40 mg via ORAL
  Filled 2021-11-15 (×6): qty 1

## 2021-11-15 MED ORDER — ATORVASTATIN CALCIUM 20 MG PO TABS
40.0000 mg | ORAL_TABLET | Freq: Every day | ORAL | Status: DC
  Administered 2021-11-16 – 2021-11-21 (×6): 40 mg via ORAL
  Filled 2021-11-15 (×6): qty 2

## 2021-11-15 MED ORDER — CYCLOBENZAPRINE HCL 10 MG PO TABS
5.0000 mg | ORAL_TABLET | Freq: Every day | ORAL | Status: DC | PRN
  Administered 2021-11-16 – 2021-11-20 (×2): 5 mg via ORAL
  Filled 2021-11-15 (×2): qty 1

## 2021-11-15 MED ORDER — LOSARTAN POTASSIUM 25 MG PO TABS
12.5000 mg | ORAL_TABLET | Freq: Every day | ORAL | Status: DC
  Filled 2021-11-15: qty 0.5

## 2021-11-15 MED ORDER — ONDANSETRON HCL 4 MG/2ML IJ SOLN: 4.0000 mg | Freq: Four times a day (QID) | INTRAMUSCULAR | Status: DC | PRN

## 2021-11-15 MED ORDER — METOPROLOL SUCCINATE ER 50 MG PO TB24: 50.0000 mg | ORAL_TABLET | Freq: Every day | ORAL | Status: DC

## 2021-11-15 MED ORDER — CLOPIDOGREL BISULFATE 75 MG PO TABS
75.0000 mg | ORAL_TABLET | Freq: Every day | ORAL | Status: DC
  Administered 2021-11-16 – 2021-11-21 (×6): 75 mg via ORAL
  Filled 2021-11-15 (×6): qty 1

## 2021-11-15 MED ORDER — ACETAMINOPHEN 650 MG RE SUPP: 650.0000 mg | Freq: Four times a day (QID) | RECTAL | Status: DC | PRN

## 2021-11-15 MED ORDER — VITAMIN D3 25 MCG (1000 UNIT) PO TABS
1000.0000 [IU] | ORAL_TABLET | Freq: Every day | ORAL | Status: DC
  Administered 2021-11-16 – 2021-11-21 (×6): 1000 [IU] via ORAL
  Filled 2021-11-15 (×11): qty 1

## 2021-11-15 MED ORDER — POTASSIUM CHLORIDE CRYS ER 20 MEQ PO TBCR: 40.0000 meq | EXTENDED_RELEASE_TABLET | Freq: Once | ORAL | Status: AC

## 2021-11-15 MED ORDER — TAMSULOSIN HCL 0.4 MG PO CAPS
0.4000 mg | ORAL_CAPSULE | Freq: Every day | ORAL | Status: DC
  Administered 2021-11-16 – 2021-11-21 (×6): 0.4 mg via ORAL
  Filled 2021-11-15 (×6): qty 1

## 2021-11-15 NOTE — Progress Notes (Signed)
Messaged Jackelyn Hoehn about sodium being 125. Asked if it was ok to still give the lasix. Per Jackelyn Hoehn ok to give lasix but patient needs to stop metolazone. Patient instructed.

## 2021-11-15 NOTE — ED Provider Notes (Signed)
[facility] Provider Note    Event Date/Time   First MD Initiated Contact with Patient 11/15/21 1731     (approximate)   History   Shortness of Breath   HPI  Henry Lindsey is a 88 y.o. male with history of advanced CHF on 2 L of home oxygen presents to the ER for IV diuresis as he has been having increasing fluid retention and worsening orthopnea and exertional shortness of breath despite increasing diuretics at home.  He was seen at outpatient cardiology clinic today and on review of the chart was given IV dose of Lasix but also found to be mildly hyponatremic due to his deterioration over the past several days was sent to the hospital for admission for IV diuresis and consideration of inotropic support     Physical Exam   Triage Vital Signs: ED Triage Vitals  Enc Vitals Group     BP 11/15/21 1645 132/72     Pulse Rate 11/15/21 1645 74     Resp 11/15/21 1643 (!) 22     Temp 11/15/21 1643 98.9 F (37.2 C)     Temp Source 11/15/21 1643 Oral     SpO2 11/15/21 1645 97 %     Weight --      Height --      Head Circumference --      Peak Flow --      Pain Score 11/15/21 1644 0     Pain Loc --      Pain Edu? --      Excl. in [place]? --     Most recent vital signs: Vitals:   11/15/21 1810 11/15/21 1815  BP: 123/74   Pulse:    Resp: 19 (!) 24  Temp:    SpO2:       Constitutional: Alert  Eyes: Conjunctivae are normal.  Head: Atraumatic. Nose: No congestion/rhinnorhea. Mouth/Throat: Mucous membranes are moist.   Neck: Painless ROM.  Cardiovascular:   Good peripheral circulation. Respiratory: Normal respiratory effort.  No retractions.  Gastrointestinal: Soft and nontender.  Musculoskeletal:  no deformity.  Ble edema Neurologic:  MAE spontaneously. No gross focal neurologic deficits are appreciated.  Skin:  Skin is warm, dry and intact. No rash noted. Psychiatric: Mood and affect are normal. Speech and behavior are normal.    ED Results /  Procedures / Treatments   Labs (all labs ordered are listed, but only abnormal results are displayed) Labs Reviewed  BASIC METABOLIC PANEL - Abnormal; Notable for the following components:      Result Value   Sodium 124 (*)    Chloride 85 (*)    Glucose, Bld 138 (*)    BUN 54 (*)    All other components within normal limits  CBC - Abnormal; Notable for the following components:   Hemoglobin 10.8 (*)    HCT 37.2 (*)    MCH 23.7 (*)    MCHC 29.0 (*)    RDW 18.7 (*)    All other components within normal limits  TROPONIN I (HIGH SENSITIVITY) - Abnormal; Notable for the following components:   Troponin I (High Sensitivity) 28 (*)    All other components within normal limits  RESP PANEL BY RT-PCR (FLU A&B, COVID) ARPGX2  BRAIN NATRIURETIC PEPTIDE  MAGNESIUM  TROPONIN I (HIGH SENSITIVITY)     EKG  ED ECG REPORT I, Merlyn Lot, the attending physician, personally viewed and interpreted this ECG.   Date: 11/15/2021  EKG Time: 16:52  Rate: 75  Rhythm: sinus with pvc  Axis: right  Interval normal qt  ST&T Change: nonspecific st abn, no stemi    RADIOLOGY Please see ED Course for my review and interpretation.  I personally reviewed all radiographic images ordered to evaluate for the above acute complaints and reviewed radiology reports and findings.  These findings were personally discussed with the patient.  Please see medical record for radiology report.    PROCEDURES:  Critical Care performed: No  Procedures   MEDICATIONS ORDERED IN ED: Medications - No data to display   IMPRESSION / MDM / Ricardo / ED COURSE  I reviewed the triage vital signs and the nursing notes.                              Differential diagnosis includes, but is not limited to, CHF, ACS, anemia, electrolyte abnormality, dehydration, failure to thrive, volume overload  Patient with extensive past medical history of advanced CHF presents to the ER with worsening  exertional dyspnea orthopnea symptoms of congestive heart failure failing outpatient diuresis with 22 pound weight gain sent to the ER for admission for IV diuresis.  Noted to be hyponatremic.  Mild bump in his troponin likely secondary to his chronic CHF.  He is not acutely hypoxic he is currently hemodynamically stable.  I have consulted hospitalist for admission.       FINAL CLINICAL IMPRESSION(S) / ED DIAGNOSES   Final diagnoses:  Acute on chronic congestive heart failure, unspecified heart failure type ([place])  Hyponatremia     Rx / DC Orders   ED Discharge Orders     None        Note:  This document was prepared using Dragon voice recognition software and may include unintentional dictation errors.    Merlyn Lot, MD 11/15/21 Tresa Moore

## 2021-11-15 NOTE — Progress Notes (Signed)
Cardiology Office Note    Date:  11/15/2021   ID:  Henry Lindsey, Henry Lindsey 08/09/1933, MRN [id number]  PCP:  Baxter Hire, MD  Cardiologist:  Ida Rogue, MD  Electrophysiologist:  None   Chief Complaint: Shortness of breath  History of Present Illness:   Henry Lindsey is a 88 y.o. male with history of CAD status post high risk Impella supported PCI/arthrectomy to the LAD in 07/2021, chronic combined systolic and diastolic CHF, ICM, CVA, DM, HTN, HLD, PAD, carotid artery disease, anemia, lower extremity cellulitis, and GERD who presents for evaluation of shortness of breath.  In the setting of respiratory failure, he was diagnosed with bronchitis and pneumonia in 09/2020.  Echo in 10/2020 showed an EF of 35% with normal RVSP, moderate mitral regurgitation, mild tricuspid regurgitation, and a mild pericardial effusion.  He was placed on Lasix and referred to cardiology.  He subsequently underwent diagnostic cardiac cath in 11/2020 which revealed severe diffuse LAD, diagonal, and obtuse marginal disease with an occluded RCA.  Cardiac output and index were normal.  LVEF was 35%.  He was evaluated by CT surgery and felt to be a poor surgical candidate, and therefore was medically managed.  Relative hypotension has precluded escalation of GDMT.  It is noted he has previously had a rash with SGLT2 inhibitor.  He was admitted to the hospital in 07/2021 with an NSTEMI and volume overload.  He was IV diuresed.  Echo demonstrated an EF of 25 to 30%, global hypokinesis with severe hypokinesis of the anterior, septal, and apical region.  The inferior wall was best preserved.  Grade 2 diastolic dysfunction, moderately reduced RV systolic function with moderately enlarged RV cavity size, mildly elevated PASP estimated at 41.6 mmHg, moderately dilated left atrium, moderate to severe mitral regurgitation, mild to moderate tricuspid regurgitation, mildly dilated aortic root measuring 39 mm, and an estimated right  atrial pressure of 15 mmHg.  Subsequent R/LHC along with abdominal aortogram on 08/14/2021 demonstrated severe multivessel CAD including 80 to 90% ostial LAD stenosis with CTO of the proximal RCA that was previously noted on cath in 11/2020.  Moderately elevated left heart, right heart, and pulmonary artery pressures.  Moderately reduced cardiac output and index.  An ectatic abdominal aorta/common/external iliac arteries, and common femoral arteries were without significant disease.  Focal 70% right internal iliac artery stenosis was noted.  There was 50% ostial stenosis of the right renal artery.  Given findings, the patient was transferred to [facility] where he underwent successful Impella supported high risk PCI of the ostial LAD with atherectomy and drug eluding stent placement.  The stent extended 1 to 2 mm into the left main coronary artery to ensure coverage of the ostium.  There was normal flow into the left circumflex.  Moderately elevated LVEDP at 24 mmHg at the beginning of the case was noted.    He has not undergone repeat echo since undergoing PCI in 07/2021.  He was admitted to the hospital in 09/2021 with knee arthroplasty infection requiring revision and has been followed by ID.  Most recently, he has been followed by the [facility] CHF clinic.  At his visit with them on 12/29, his weight was up 15 pounds by their scale.  He was sent for IV Lasix 80 mg.  He was seen by them the following day and again sent for another 80 mg of IV Lasix.  By their scale, his weight was up 2 pounds when compared to the day  prior.  He was seen by them on 10/23/2021 with a weight that was up another 3 pounds when compared to his prior visit.  He was sent for IV Lasix a third time.  He was seen by them on 1/6 with a weight that was down 4 pounds when compared to his prior visit.  It was noted he had taken metolazone 2.5 mg daily for the preceding 3 days.  At his visit with him on 1/10, his weight was unchanged.  He  was felt to be euvolemic.  It was recommended he start losartan.  He was last seen by them on 11/12/2021 with a weight that was down 1 pound when compared to his prior visit.  He was taking metolazone twice weekly along with furosemide 40 mg twice daily.  He did contact the CHF clinic's office on 11/14/2021 noting he felt like he continued to feel bloated and did not feel like he was voiding much.  They recommended follow-up labs and IV Lasix.  Labs at time of IV Lasix given in short stay today show an uptrending BNP of 2665, downtrending sodium of 125, downtrending chloride at 85, uptrending BUN of 56, stable creatinine, and improved potassium of 3.4.  I am meeting the patient for the first time today.  He comes in accompanied by his girlfriend of 7 years today.  He notes a several month history of increased dyspnea lower extremity swelling, significant abdominal distention, and orthopnea (sleeping in a recliner).  By our scale, his weight is up 22 pounds when compared to his clinic visit in our office dated 10/26/2020 with a trend of 176 to 198 pounds today.  Despite escalation of oral diuresis and addition of outpatient IV Lasix, he remains significantly volume overloaded.  He does not add salt to foods and drinks approximately 2 L of liquids per day.  He is adherent to his medications.  He denies any symptoms of chest pain or palpitations.  No falls, hematochezia, or melena.   Labs independently reviewed: 10/2021 - sodium 125, potassium 3.4, BUN 56, serum creatinine 1.00, albumin 3.7, AST/ALT not elevated, Hgb 10, PLT 150 09/2021 - A1c 7.0 07/2021 - magnesium 1.8, TC 53, TG 54, HDL 13, LDL 29 09/2016 - TSH normal  Past Medical History:  Diagnosis Date   Anginal pain (HCC)    Aortic atherosclerosis (HCC)    Arthritis    BPH (benign prostatic hyperplasia)    CAD (coronary artery disease)    a.) 2/22 Cath: LM 50d, LAD sev ost dz, mod-sev prox/mid dz. D1 sev dz. LCX mild prox dzs, OM2 mod-sev dz, RCA  100p. RPL and RPDA fill via L-R collats. EF 35%. Seen by CVTS->Med mgmt. b.) R/LHC 08/14/21: 30% m-d-LM, 85% oLAD, 50% pLAD, 35% dLAD, 80% D1, 50% pLCx, 70% OM1, 100% p-dRCA -> trans to [facility]. c.) 08/15/21 Impella supported HIGH RISK PCI with atherectomy. 2.75 x 61m Onyx Frontier DES oLAD.   Carotid artery stenosis    a. 11/2010 U/S: <50% bilaterally; b. 03/2021 40-59% bilat ICA stenoses.   COPD (chronic obstructive pulmonary disease) ([place]    noted CT 10/05/16 former smoker quit age 88   CVA (cerebral infarction) 11/2010   a.) RIGHT thalamic lacunar   GERD (gastroesophageal reflux disease)    Heart murmur    HFrEF (heart failure with reduced ejection fraction) ([place]    a. 10/2020 Echo: EF 35%.   Hyperlipidemia    Hypertension    Ischemic cardiomyopathy    a. 10/2020  Echo: EF 35%. Nl RVSP. Mod MR. Mild TR; b. 11/2020 Cath:  CO/CI 5.05/2.49. LV gram: EF 35%.   Lower extremity cellulitis    NSTEMI (non-ST elevated myocardial infarction) ([place]) 08/09/2021   a.) R/LHC 08/14/2021: 30% m-d-LM, 85% oLAD, 50% pLAD, 35% dLAD, 80% D1, 50% pLCx, 70% OM1, 100% p-dRCA -> transfer to [facility]. b.) 08/15/2021 Impella supported HIGH RISK PCI with atherectomy. 2.75 x 20m Onyx Frontier DES to [employer]   Peripheral vascular disease in diabetes mellitus ([place]    a. 06/2012 95% occlusion s/p PTA R SFA (Dr. DLucky Cowboy; b. 03/2021 ABIs: R 1.02, L 0.96.   Pneumonia    Type 2 diabetes mellitus treated with insulin ([facility]     Past Surgical History:  Procedure Laterality Date   ABDOMINAL AORTOGRAM N/A 08/14/2021   Procedure: ABDOMINAL AORTOGRAM;  Surgeon: ENelva Bush MD;  [place];  Service: Cardiovascular;  Laterality: N/A;   CHOLECYSTECTOMY N/A 11/13/2016   Procedure: LAPAROSCOPIC CHOLECYSTECTOMY WITH INTRAOPERATIVE CHOLANGIOGRAM;  Surgeon: JOlean Ree MD;  Location: [facility] ORS;  Service: General;  Laterality: N/A;   COLONOSCOPY WITH PROPOFOL N/A 02/10/2018   Procedure: COLONOSCOPY WITH PROPOFOL;   Surgeon: WLucilla Lame MD;  Location: [facility] ENDOSCOPY;  Service: Endoscopy;  Laterality: N/A;   CORONARY ATHERECTOMY N/A 08/15/2021   Procedure: CORONARY ATHERECTOMY;  Surgeon: AWellington Hampshire MD;  [place] [facility] [place];  Service: Cardiovascular;  Laterality: N/A;   CORONARY STENT INTERVENTION W/IMPELLA N/A 08/15/2021   Procedure: Coronary Stent Intervention w/Impella;  Surgeon: AWellington Hampshire MD;  [place];  Service: Cardiovascular;  Laterality: N/A;   ERCP N/A 11/17/2016   Procedure: ENDOSCOPIC RETROGRADE CHOLANGIOPANCREATOGRAPHY (ERCP);  Surgeon: JIrene Shipper MD;  [facility];  Service: Endoscopy;  Laterality: N/A;   ERCP N/A 02/04/2017   Procedure: ENDOSCOPIC RETROGRADE CHOLANGIOPANCREATOGRAPHY (ERCP) Stent removal;  Surgeon: DLucilla Lame MD;  Location: [facility] ENDOSCOPY;  Service: Endoscopy;  Laterality: N/A;   IR GENERIC HISTORICAL  10/06/2016   IR PERC CHOLECYSTOSTOMY 10/06/2016 GAletta Edouard MD [facility]   JOINT REPLACEMENT Left 2014   left knee   RIGHT/LEFT HEART CATH AND CORONARY ANGIOGRAPHY N/A 11/24/2020   Procedure: RIGHT/LEFT HEART CATH AND CORONARY ANGIOGRAPHY;  Surgeon: GMinna Merritts MD;  [place] [facility];  Service: Cardiovascular;  Laterality: N/A;   RIGHT/LEFT HEART CATH AND CORONARY ANGIOGRAPHY N/A 08/14/2021   Procedure: RIGHT/LEFT HEART CATH AND CORONARY ANGIOGRAPHY;  Surgeon: ENelva Bush MD;  [place] [facility];  Service: Cardiovascular;  Laterality: N/A;   TOTAL KNEE ARTHROPLASTY Right 08/08/2020   Procedure: TOTAL KNEE ARTHROPLASTY;  Surgeon: BLovell Sheehan MD;  Location: [facility] ORS;  Service: Orthopedics;  Laterality: Right;   TOTAL KNEE REVISION Right 10/01/2021   Procedure: IRRIGATION AND DEBRIDMENT RIGHT KNEE WITH POLY EXCHANGE;  Surgeon: BLovell Sheehan MD;  Location: [facility] ORS;  Service: Orthopedics;  Laterality: Right;   UPPER GASTROINTESTINAL ENDOSCOPY     WRIST SURGERY       Current Medications: Current Meds  Medication Sig   albuterol (VENTOLIN HFA) 108 (90 Base) MCG/ACT inhaler Inhale 2 puffs into the lungs every 6 (six) hours as needed for shortness of breath or wheezing.   ascorbic acid (VITAMIN C) 500 MG tablet Take 500 mg by mouth daily.    aspirin EC 81 MG tablet Take 1 tablet (81 mg total) by mouth daily. Swallow whole.   atorvastatin (LIPITOR) 40 MG tablet Take 1 tablet (40 mg total) by mouth daily.  Blood Glucose Monitoring Suppl (ONE TOUCH ULTRA SYSTEM KIT) w/Device KIT 1 kit by Does not apply route once. Use DX code E11.59   cholecalciferol (VITAMIN D) 25 MCG (1000 UNIT) tablet Take 1,000 Units by mouth daily.   clopidogrel (PLAVIX) 75 MG tablet TAKE 1 TABLET(75 MG) BY MOUTH DAILY WITH BREAKFAST   cyclobenzaprine (FLEXERIL) 5 MG tablet Take 5-10 mg by mouth daily as needed for muscle spasms.   diclofenac Sodium (VOLTAREN) 1 % GEL Apply 2 g topically 2 (two) times daily as needed (pain).   docusate sodium (COLACE) 100 MG capsule Take 1 capsule (100 mg total) by mouth 2 (two) times daily.   furosemide (LASIX) 40 MG tablet Take 1 tablet (40 mg total) by mouth 2 (two) times daily.   gabapentin (NEURONTIN) 400 MG capsule Take 400 mg by mouth 2 (two) times daily.   glipiZIDE (GLUCOTROL) 10 MG tablet Take 10 mg by mouth daily before breakfast.   HYDROcodone-acetaminophen (NORCO/VICODIN) 5-325 MG tablet Take 1 tablet by mouth every 4 (four) hours as needed for moderate pain (pain score 4-6).   INS SYRINGE/NEEDLE 1CC/28G (B-D INSULIN SYRINGE 1CC/28G) 28G X 1/2" 1 ML MISC USE TO ADMINISTER INSULIN DAILY   insulin aspart protamine- aspart (NOVOLOG MIX 70/30) (70-30) 100 UNIT/ML injection Inject 0.15 mLs (15 Units total) into the skin 2 (two) times daily with a meal.   latanoprost (XALATAN) 0.005 % ophthalmic solution Place 1 drop into both eyes at bedtime.   losartan (COZAAR) 25 MG tablet Take 0.5 tablets (12.5 mg total) by mouth daily.   metoprolol  succinate (TOPROL-XL) 50 MG 24 hr tablet Take 1 tablet (50 mg total) by mouth daily. Take with or immediately following a meal.   Multiple Vitamin (MULTIVITAMIN WITH MINERALS) TABS tablet Take 1 tablet by mouth in the morning.   nitroGLYCERIN (NITROSTAT) 0.4 MG SL tablet Place 1 tablet (0.4 mg total) under the tongue every 5 (five) minutes as needed for chest pain. MAXIMUM 3 TABLETS   ONE TOUCH ULTRA TEST test strip USE 1 STRIP TO TEST THREE TIMES DAILY AS DIRECTED   ONETOUCH DELICA LANCETS 75I MISC Use three times daily to check blood sugar.   oxyCODONE (OXY IR/ROXICODONE) 5 MG immediate release tablet Take 5 mg by mouth every 4 (four) hours as needed for severe pain.   pantoprazole (PROTONIX) 40 MG tablet Take 40 mg by mouth daily.   potassium chloride SA (KLOR-CON M) 20 MEQ tablet Take 1 tablet (20 mEq total) by mouth 2 (two) times daily. Take with furosemide   spironolactone (ALDACTONE) 25 MG tablet Take 0.5 tablets (12.5 mg total) by mouth daily.   tamsulosin (FLOMAX) 0.4 MG CAPS capsule Take 0.4 mg by mouth daily after breakfast.   timolol (TIMOPTIC) 0.5 % ophthalmic solution Place 1 drop into both eyes in the morning.   tiZANidine (ZANAFLEX) 4 MG tablet Take 4 mg by mouth at bedtime.   vitamin B-12 (CYANOCOBALAMIN) 500 MCG tablet Take 500 mcg by mouth daily.   vitamin E 180 MG (400 UNITS) capsule Take 400 Units by mouth daily.    Allergies:   Jardiance [empagliflozin], Metformin and related, Trazodone, and Torsemide   Social History   Socioeconomic History   Marital status: Widowed    Spouse name: Enid Derry, deceased   Number of children: 2   Years of education: 28   Highest education level: Not on file  Occupational History    Employer: retired  Tobacco Use   Smoking status: Former  Packs/day: 0.50    Years: 33.00    Pack years: 16.50    Types: Cigarettes    Quit date: 04/21/1968    Years since quitting: 53.6   Smokeless tobacco: Never  Vaping Use   Vaping Use: Never used   Substance and Sexual Activity   Alcohol use: No   Drug use: No   Sexual activity: Not Currently    Partners: Female    Birth control/protection: None  Other Topics Concern   Not on file  Social History Narrative   Widowed in 2014; dating again , stays with girlfriend house   Social Determinants of Health   Financial Resource Strain: Not on file  Food Insecurity: Not on file  Transportation Needs: Not on file  Physical Activity: Not on file  Stress: Not on file  Social Connections: Not on file     Family History:  The patient's family history includes Diabetes in his mother.  ROS:   Review of Systems  Constitutional:  Positive for malaise/fatigue. Negative for chills, diaphoresis, fever and weight loss.  HENT:  Negative for congestion.   Eyes:  Negative for discharge and redness.  Respiratory:  Positive for shortness of breath. Negative for cough, sputum production and wheezing.   Cardiovascular:  Positive for orthopnea and leg swelling. Negative for chest pain, palpitations, claudication and PND.  Gastrointestinal:  Negative for abdominal pain, blood in stool, heartburn, melena, nausea and vomiting.       Abdominal distention  Musculoskeletal:  Negative for falls and myalgias.  Skin:  Negative for rash.  Neurological:  Positive for weakness. Negative for dizziness, tingling, tremors, sensory change, speech change, focal weakness and loss of consciousness.  Endo/Heme/Allergies:  Does not bruise/bleed easily.  Psychiatric/Behavioral:  Negative for substance abuse. The patient is not nervous/anxious.   All other systems reviewed and are negative.   EKGs/Labs/Other Studies Reviewed:    Studies reviewed were summarized above. The additional studies were reviewed today:  Coronary arthrectomy 08/15/2021:   Mid LM to Dist LM lesion is 30% stenosed.   Ost LAD lesion is 85% stenosed.   Prox LAD lesion is 50% stenosed.   Dist LAD lesion is 35% stenosed.   Prox Cx lesion is  50% stenosed.   Prox RCA to Dist RCA lesion is 100% stenosed.   1st Diag lesion is 80% stenosed.   1st Mrg lesion is 70% stenosed.   A drug-eluting stent was successfully placed using a STENT ONYX FRONTIER 2.75X26.   Post intervention, there is a 0% residual stenosis.   Post intervention, there is a 0% residual stenosis.   Post intervention, there is a 0% residual stenosis.   Successful Impella supported high risk PCI of the ostial LAD with atherectomy and drug-eluting stent placement.  The stent extended 1 to 2 mm into the left main coronary artery to ensure coverage of the ostium.  There was normal flow into the  left circumflex. Moderately elevated left ventricular end-diastolic pressure at 24 mmHg at the beginning of the case.   Recommendations: Dual antiplatelet therapy for at least 12 months and preferably longer. Aggressive treatment of risk factors. Optimize heart failure therapy.  Monitor renal function closely.  135 mL of contrast was used. __________  [facility] 08/14/2021: Conclusions: Severe multivessel coronary artery disease, including 80-90% ostial LAD stenosis and chronic total occlusion of the proximal RCA, a seen on prior catheterization in 11/2020. Moderately elevated left heart, right heart, and pulmonary artery pressures. Moderately-severely reduced cardiac output/index. Ectatic abdominal aorta  common/external iliac arteries, and common femoral arteries without significant disease.  Focal 70% right internal iliac artery stenosis is noted. 50% ostial stenosis of right renal artery.   Recommendations: Transfer to [facility] for consideration of high risk PCI to LMCA/ostial LAD.  Given heavy calcification and reduced LVEF, atherectomy and Impella support will need to be considered.  I will load the patient with clopidogrel 600 mg today, followed by 75 mg daily thereafter. Maintain net even to slightly negative fluid balance following contrast exposure today and chronic kidney  disease. Escalate goal-directed medical therapy of acute on chronic HFrEF, as tolerated. Aggressive secondary prevention of coronary artery disease. __________  2D echo 08/10/2021: 1. Left ventricular ejection fraction, by estimation, is 25 to 30%. The  left ventricle has severely decreased function. The left ventricle  demonstrates global hypokinesis with severe hypokinesis of the anterior,  septal, and apical region. Inferior wall  best preserved. Left ventricular diastolic parameters are consistent with  Grade II diastolic dysfunction (pseudonormalization). The average left  ventricular global longitudinal strain is -6.3 %. The global longitudinal  strain is abnormal.   2. Right ventricular systolic function is moderately reduced. The right  ventricular size is moderately enlarged. There is mildly elevated  pulmonary artery systolic pressure. The estimated right ventricular  systolic pressure is 82.5 mmHg.   3. Left atrial size was moderately dilated.   4. The mitral valve is normal in structure. Moderate to severe mitral  valve regurgitation.   5. Tricuspid valve regurgitation is mild to moderate.   6. Aortic dilatation noted. There is mild dilatation of the aortic root,  measuring 39 mm.   7. The inferior vena cava is dilated in size with <50% respiratory  variability, suggesting right atrial pressure of 15 mmHg. __________    EKG:  EKG is ordered today.  The EKG ordered today demonstrates NSR, 75 bpm, frequent PVCs in a pattern of ventricular trigeminy, nonspecific IVCD, cannot exclude prior septal infarct, prior inferior infarct, poor R wave progression along the precordial leads, nonspecific ST-T changes  Recent Labs: 08/16/2021: Magnesium 1.8 10/03/2021: ALT 14; Hemoglobin 9.0; Platelets 216 11/15/2021: B Natriuretic Peptide 2,665.8; BUN 56; Creatinine, Ser 1.00; Potassium 3.4; Sodium 125  Recent Lipid Panel    Component Value Date/Time   CHOL 53 08/10/2021 0643   TRIG  54 08/10/2021 0643   HDL 13 (L) 08/10/2021 0643   CHOLHDL 4.1 08/10/2021 0643   VLDL 11 08/10/2021 0643   LDLCALC 29 08/10/2021 0643   LDLDIRECT 62.0 10/04/2016 0942    PHYSICAL EXAM:    VS:  BP (!) 100/54 (BP Location: Left Arm, Patient Position: Sitting, Cuff Size: Normal)    Pulse 75    Ht '5\' 11"'  (1.803 m)    Wt 198 lb 4 oz (89.9 kg)    SpO2 98%    BMI 27.65 kg/m   BMI: Body mass index is 27.65 kg/m.  Physical Exam Vitals reviewed.  Constitutional:      Appearance: He is well-developed.     Comments: Visibly short of breath with conversation  HENT:     Head: Normocephalic and atraumatic.  Eyes:     General:        Right eye: No discharge.        Left eye: No discharge.  Neck:     Vascular: JVD present.     Comments: JVD is elevated to the angle of the mandible Cardiovascular:     Rate and Rhythm: Normal rate and regular rhythm.  Heart sounds: S1 normal and S2 normal. Heart sounds not distant. No midsystolic click and no opening snap. Murmur heard.  High-pitched blowing holosystolic murmur is present with a grade of 2/6 at the apex.    No friction rub.  Pulmonary:     Effort: Pulmonary effort is normal. No respiratory distress.     Breath sounds: Examination of the right-middle field reveals rales. Examination of the left-middle field reveals rales. Examination of the right-lower field reveals decreased breath sounds and rales. Examination of the left-lower field reveals decreased breath sounds and rales. Decreased breath sounds and rales present. No wheezing or rhonchi.  Chest:     Chest wall: No tenderness.  Abdominal:     General: There is distension.     Palpations: Abdomen is soft.     Tenderness: There is no abdominal tenderness.  Musculoskeletal:     Cervical back: Normal range of motion.     Right lower leg: Edema present.     Left lower leg: Edema present.     Comments: 2+ bilateral pitting edema to the knees  Skin:    General: Skin is warm and dry.      Nails: There is no clubbing.  Neurological:     Mental Status: He is alert and oriented to person, place, and time.  Psychiatric:        Speech: Speech normal.        Behavior: Behavior normal.        Thought Content: Thought content normal.        Judgment: Judgment normal.    Wt Readings from Last 3 Encounters:  11/15/21 198 lb 4 oz (89.9 kg)  11/12/21 193 lb 4 oz (87.7 kg)  10/30/21 194 lb (88 kg)     ASSESSMENT & PLAN:   Acute on chronic combined systolic and diastolic CHF with low output/ischemic cardiomyopathy/pulmonary hypertension: Patient is massively volume overloaded on exam today.  His weight is up 22 pounds when compared to his last clinic visit with [place] in 06/2021.  He has NYHA class IV symptoms.  He has significant electrolyte derangements and is hypotensive.  He has failed outpatient oral and IV diuresis.  I have discussed the case with Dr. Fletcher Anon and we recommend the patient be transported to the ED with hospital admission.  We are unable to directly admit secondary to the COVID-19 pandemic and bed shortage.  The patient will likely need inotropic support to augment diuresis.  He will need close monitoring of his electrolytes throughout this process.  With decompensated heart failure and hypotension we will need to likely hold metoprolol and losartan upon admission with recommendation to escalate GDMT as able.  He has not had an echo since his high risk PCI and will need this to be updated.  Given his advanced age, and remains uncertain if he would be a candidate for invasive advanced CHF therapies available in [place].  CAD involving the native coronary arteries without angina: No symptoms concerning for angina.  He remains on DAPT with aspirin and clopidogrel.  He does have a downtrending hemoglobin as outlined below which should be further evaluated.  At this time, pending clinical course and labs there does not appear to be eminent need for ischemic evaluation.  Severe  electrolyte derangements: Patient has been transported to the ED for further evaluation and hospital admission as outlined above.  HTN: Blood pressure is too soft for ongoing outpatient diuresis or escalation of GDMT.  Recommend ED evaluation with hospital  admission as outlined above.  We will likely need to hold losartan and metoprolol as outlined above.  HLD: LDL 29 in 07/2021.  He remains on atorvastatin 40 mg.  Anemia: Hemoglobin has down trended from the 12 range to the 9-10 range over the past couple of months.  Needs further evaluation.  PAD/carotid artery disease: Followed by vascular surgery.    Disposition: F/u with Dr. Rockey Situ or an APP following hospitalization.   Medication Adjustments/Labs and Tests Ordered: Current medicines are reviewed at length with the patient today.  Concerns regarding medicines are outlined above. Medication changes, Labs and Tests ordered today are summarized above and listed in the Patient Instructions accessible in Encounters.   Signed, Christell Faith, PA-C 11/15/2021 4:36 PM     [address] [place], [place] [address] [phone]

## 2021-11-15 NOTE — H&P (Addendum)
History and Physical    Henry Lindsey GBE:[id number] DOB: 1933-02-05 DOA: 11/15/2021  PCP: Baxter Hire, MD   Patient coming from: Home  I have personally briefly reviewed patient's old medical records in [place]  Chief Complaint: Shortness of breath  HPI: Henry Lindsey is a 88 y.o. male with medical history significant for coronary artery disease status post stent angioplasty, history of chronic combined systolic and diastolic dysfunction CHF with last known LVEF of 25 to 30% with severely decreased LV systolic function and grade 2 diastolic dysfunction.2D echocardiogram also shows moderately reduced RV systolic function with elevated right ventricular systolic pressure of 75.8ITGP, history of COPD with chronic respiratory failure on 2 L of oxygen continuous, insulin-dependent diabetes mellitus, hypertension, GERD, CVA who was sent to the emergency room from his cardiologist office for evaluation of worsening shortness of breath from his baseline associated with orthopnea, increased abdominal girth, lower extremity swelling and increased weight gain.  Patient states that his baseline weight is about 185 pounds and he was 198 pounds at the cardiologist office. He admits to being compliant with his medications and denies any dietary discretion. He has a cough productive of clear phlegm but denies having any fever, no chills, no changes in his bowel habits, no urinary frequency, no nocturia, no dysuria, no headache, no blurred vision, no focal deficit. Sodium 124, potassium 3.5, chloride 85, bicarb 28, glucose 138, BUN 54, creatinine 0.94, calcium 9.1, BNP 2065, troponin 28, white count 5.9, hemoglobin 10.8, hematocrit 37.2, MCV 81.8, RDW 18.7, platelet count 163 Respiratory viral panel is negative Chest x-ray reviewed by me shows moderate-sized right pleural effusion, significantly increased as well as small left pleural effusion.  Mild bibasilar atelectasis. Twelve-lead EKG reviewed by  me shows sinus rhythm with PVCs as well as T wave changes in inferior leads.   ED Course: Patient is an 88 year old male with a history of chronic combined systolic and diastolic dysfunction CHF who was sent to the ER by his cardiologist for evaluation of acute on chronic combined systolic and diastolic dysfunction CHF. Patient received a dose of IV Lasix in the cardiologist office with appropriate diuresis. He will be admitted to the hospital for further evaluation.   Review of Systems: As per HPI otherwise all other systems reviewed and negative.    Past Medical History:  Diagnosis Date   Anginal pain ([place])    Aortic atherosclerosis (HCC)    Arthritis    BPH (benign prostatic hyperplasia)    CAD (coronary artery disease)    a.) 2/22 Cath: LM 50d, LAD sev ost dz, mod-sev prox/mid dz. D1 sev dz. LCX mild prox dzs, OM2 mod-sev dz, RCA 100p. RPL and RPDA fill via L-R collats. EF 35%. Seen by CVTS->Med mgmt. b.) R/LHC 08/14/21: 30% m-d-LM, 85% oLAD, 50% pLAD, 35% dLAD, 80% D1, 50% pLCx, 70% OM1, 100% p-dRCA -> trans to [facility]. c.) 08/15/21 Impella supported HIGH RISK PCI with atherectomy. 2.75 x 41m Onyx Frontier DES oLAD.   Carotid artery stenosis    a. 11/2010 U/S: <50% bilaterally; b. 03/2021 40-59% bilat ICA stenoses.   CHF (congestive heart failure) (HCC)    COPD (chronic obstructive pulmonary disease) ([place]    noted CT 10/05/16 former smoker quit age 89   CVA (cerebral infarction) 11/2010   a.) RIGHT thalamic lacunar   GERD (gastroesophageal reflux disease)    Heart murmur    HFrEF (heart failure with reduced ejection fraction) ([place]    a. 10/2020 Echo:  EF 35%.   Hyperlipidemia    Hypertension    Ischemic cardiomyopathy    a. 10/2020 Echo: EF 35%. Nl RVSP. Mod MR. Mild TR; b. 11/2020 Cath:  CO/CI 5.05/2.49. LV gram: EF 35%.   Lower extremity cellulitis    NSTEMI (non-ST elevated myocardial infarction) ([place]) 08/09/2021   a.) R/LHC 08/14/2021: 30% m-d-LM, 85% oLAD, 50% pLAD, 35% dLAD,  80% D1, 50% pLCx, 70% OM1, 100% p-dRCA -> transfer to [facility]. b.) 08/15/2021 Impella supported HIGH RISK PCI with atherectomy. 2.75 x 47m Onyx Frontier DES to [employer]   Peripheral vascular disease in diabetes mellitus ([place]    a. 06/2012 95% occlusion s/p PTA R SFA (Dr. DLucky Cowboy; b. 03/2021 ABIs: R 1.02, L 0.96.   Pneumonia    Type 2 diabetes mellitus treated with insulin ([facility]     Past Surgical History:  Procedure Laterality Date   ABDOMINAL AORTOGRAM N/A 08/14/2021   Procedure: ABDOMINAL AORTOGRAM;  Surgeon: ENelva Bush MD;  [facility];  Service: Cardiovascular;  Laterality: N/A;   CHOLECYSTECTOMY N/A 11/13/2016   Procedure: LAPAROSCOPIC CHOLECYSTECTOMY WITH INTRAOPERATIVE CHOLANGIOGRAM;  Surgeon: JOlean Ree MD;  Location: [facility];  Service: General;  Laterality: N/A;   COLONOSCOPY WITH PROPOFOL N/A 02/10/2018   Procedure: COLONOSCOPY WITH PROPOFOL;  Surgeon: WLucilla Lame MD;  Location: [facility];  Service: Endoscopy;  Laterality: N/A;   CORONARY ATHERECTOMY N/A 08/15/2021   Procedure: CORONARY ATHERECTOMY;  Surgeon: AWellington Hampshire MD;  [facility];  Service: Cardiovascular;  Laterality: N/A;   CORONARY STENT INTERVENTION W/IMPELLA N/A 08/15/2021   Procedure: Coronary Stent Intervention w/Impella;  Surgeon: AWellington Hampshire MD;  [facility];  Service: Cardiovascular;  Laterality: N/A;   ERCP N/A 11/17/2016   Procedure: ENDOSCOPIC RETROGRADE CHOLANGIOPANCREATOGRAPHY (ERCP);  Surgeon: JIrene Shipper MD;  [facility];  Service: Endoscopy;  Laterality: N/A;   ERCP N/A 02/04/2017   Procedure: ENDOSCOPIC RETROGRADE CHOLANGIOPANCREATOGRAPHY (ERCP) Stent removal;  Surgeon: DLucilla Lame MD;  Location: [facility];  Service: Endoscopy;  Laterality: N/A;   IR GENERIC HISTORICAL  10/06/2016   IR PERC CHOLECYSTOSTOMY 10/06/2016 GAletta Edouard MD [facility]   JOINT REPLACEMENT Left 2014   left knee   RIGHT/LEFT HEART CATH  AND CORONARY ANGIOGRAPHY N/A 11/24/2020   Procedure: RIGHT/LEFT HEART CATH AND CORONARY ANGIOGRAPHY;  Surgeon: GMinna Merritts MD;  [facility];  Service: Cardiovascular;  Laterality: N/A;   RIGHT/LEFT HEART CATH AND CORONARY ANGIOGRAPHY N/A 08/14/2021   Procedure: RIGHT/LEFT HEART CATH AND CORONARY ANGIOGRAPHY;  Surgeon: ENelva Bush MD;  [facility];  Service: Cardiovascular;  Laterality: N/A;   TOTAL KNEE ARTHROPLASTY Right 08/08/2020   Procedure: TOTAL KNEE ARTHROPLASTY;  Surgeon: BLovell Sheehan MD;  Location: [facility];  Service: Orthopedics;  Laterality: Right;   TOTAL KNEE REVISION Right 10/01/2021   Procedure: IRRIGATION AND DEBRIDMENT RIGHT KNEE WITH POLY EXCHANGE;  Surgeon: BLovell Sheehan MD;  Location: [facility];  Service: Orthopedics;  Laterality: Right;   UPPER GASTROINTESTINAL ENDOSCOPY     WRIST SURGERY       reports that he quit smoking about 53 years ago. His smoking use included cigarettes. He has a 16.50 pack-year smoking history. He has never used smokeless tobacco. He reports that he does not drink alcohol and does not use drugs.  Allergies  Allergen Reactions   Jardiance [Empagliflozin]     Rash all over and lips turn purple.    Metformin And  Related Diarrhea   Trazodone Other (See Comments)    Extreme hallucinations/ psychosis    Torsemide Rash    "Skin reaction"    Family History  Problem Relation Age of Onset   Diabetes Mother       Prior to Admission medications   Medication Sig Start Date End Date Taking? Authorizing Provider  albuterol (VENTOLIN HFA) 108 (90 Base) MCG/ACT inhaler Inhale 2 puffs into the lungs every 6 (six) hours as needed for shortness of breath or wheezing. 10/29/20   [provider]  ascorbic acid (VITAMIN C) 500 MG tablet Take 500 mg by mouth daily.     [provider]  aspirin EC 81 MG tablet Take 1 tablet (81 mg total) by mouth daily. Swallow whole. 01/31/21   Minna Merritts, MD  atorvastatin (LIPITOR) 40 MG tablet Take 1 tablet (40 mg total) by mouth daily. 12/08/20   Minna Merritts, MD  Blood Glucose Monitoring Suppl (ONE TOUCH ULTRA SYSTEM KIT) w/Device KIT 1 kit by Does not apply route once. Use DX code E11.59 10/18/15   [name], MD  cholecalciferol (VITAMIN D) 25 MCG (1000 UNIT) tablet Take 1,000 Units by mouth daily.    [provider]  clopidogrel (PLAVIX) 75 MG tablet TAKE 1 TABLET(75 MG) BY MOUTH DAILY WITH BREAKFAST 09/11/21   Minna Merritts, MD  cyclobenzaprine (FLEXERIL) 5 MG tablet Take 5-10 mg by mouth daily as needed for muscle spasms. 07/31/21   [provider]  diclofenac Sodium (VOLTAREN) 1 % GEL Apply 2 g topically 2 (two) times daily as needed (pain).    [provider]  docusate sodium (COLACE) 100 MG capsule Take 1 capsule (100 mg total) by mouth 2 (two) times daily. 10/04/21   Carlynn Spry, PA-C  furosemide (LASIX) 40 MG tablet Take 1 tablet (40 mg total) by mouth 2 (two) times daily. 10/19/21 01/17/22  Alisa Graff, FNP  gabapentin (NEURONTIN) 400 MG capsule Take 400 mg by mouth 2 (two) times daily.    [provider]  glipiZIDE (GLUCOTROL) 10 MG tablet Take 10 mg by mouth daily before breakfast.    [provider]  HYDROcodone-acetaminophen (NORCO/VICODIN) 5-325 MG tablet Take 1 tablet by mouth every 4 (four) hours as needed for moderate pain (pain score 4-6). 10/04/21   Carlynn Spry, PA-C  INS SYRINGE/NEEDLE 1CC/28G (B-D INSULIN SYRINGE 1CC/28G) 28G X 1/2" 1 ML MISC USE TO ADMINISTER INSULIN DAILY 02/13/16   [name], MD  insulin aspart protamine- aspart (NOVOLOG MIX 70/30) (70-30) 100 UNIT/ML injection Inject 0.15 mLs (15 Units total) into the skin 2 (two) times daily with a meal. 08/14/21   Fritzi Mandes, MD  latanoprost (XALATAN) 0.005 % ophthalmic solution Place 1 drop into both eyes at bedtime. 11/01/15   [provider]  losartan (COZAAR) 25 MG tablet Take  0.5 tablets (12.5 mg total) by mouth daily. 10/30/21 01/28/22  Alisa Graff, FNP  metolazone (ZAROXOLYN) 2.5 MG tablet Take 1 tablet (2.5 mg total) by mouth 2 (two) times a week. Take this 1/2 hour before furosemide Patient not taking: Reported on 11/15/2021 11/15/21 02/13/22  Alisa Graff, FNP  metoprolol succinate (TOPROL-XL) 50 MG 24 hr tablet Take 1 tablet (50 mg total) by mouth daily. Take with or immediately following a meal. 08/21/21   Bhagat, [place], PA  Multiple Vitamin (MULTIVITAMIN WITH MINERALS) TABS tablet Take 1 tablet by mouth in the morning.    [provider]  nitroGLYCERIN (NITROSTAT)  0.4 MG SL tablet Place 1 tablet (0.4 mg total) under the tongue every 5 (five) minutes as needed for chest pain. MAXIMUM 3 TABLETS 12/08/20   Minna Merritts, MD  ONE TOUCH ULTRA TEST test strip USE 1 STRIP TO TEST THREE TIMES DAILY AS DIRECTED 08/31/18   [name], MD  [facility] DELICA LANCETS 73A MISC Use three times daily to check blood sugar. 10/18/15   [name], MD  oxyCODONE (OXY IR/ROXICODONE) 5 MG immediate release tablet Take 5 mg by mouth every 4 (four) hours as needed for severe pain.    [provider]  pantoprazole (PROTONIX) 40 MG tablet Take 40 mg by mouth daily.    [provider]  potassium chloride SA (KLOR-CON M) 20 MEQ tablet Take 1 tablet (20 mEq total) by mouth 2 (two) times daily. Take with furosemide 11/13/21   Darylene Price A, FNP  spironolactone (ALDACTONE) 25 MG tablet Take 0.5 tablets (12.5 mg total) by mouth daily. 08/21/21   Bhagat, Crista Luria, PA  tamsulosin (FLOMAX) 0.4 MG CAPS capsule Take 0.4 mg by mouth daily after breakfast.    [provider]  timolol (TIMOPTIC) 0.5 % ophthalmic solution Place 1 drop into both eyes in the morning. 12/19/17   [provider]  tiZANidine (ZANAFLEX) 4 MG tablet Take 4 mg by mouth at bedtime. 10/29/21   [provider]  vitamin B-12 (CYANOCOBALAMIN) 500 MCG tablet Take  500 mcg by mouth daily.    [provider]  vitamin E 180 MG (400 UNITS) capsule Take 400 Units by mouth daily.    [provider]    Physical Exam: Vitals:   11/15/21 1815 11/15/21 1820 11/15/21 1840 11/15/21 1852  BP:  (!) 102/50 115/65 115/65  Pulse:    71  Resp: (!) '24 15 18 18  ' Temp:    98.5 F (36.9 C)  TempSrc:    Oral  SpO2:    98%     Vitals:   11/15/21 1815 11/15/21 1820 11/15/21 1840 11/15/21 1852  BP:  (!) 102/50 115/65 115/65  Pulse:    71  Resp: (!) '24 15 18 18  ' Temp:    98.5 F (36.9 C)  TempSrc:    Oral  SpO2:    98%      Constitutional: Alert and oriented x 3 . Not in any apparent distress, chronically ill-appearing, cyanotic discoloration over his lips.  Conversational dyspnea HEENT:      Head: Normocephalic and atraumatic.         Eyes: PERLA, EOMI, Conjunctivae pallor. Sclera is non-icteric.       Mouth/Throat: Mucous membranes are moist.       Neck: Supple with no signs of meningismus. Cardiovascular: Regular rate and rhythm. No murmurs, gallops, or rubs. 2+ symmetrical distal pulses are present . No JVD.1+ LE edema Respiratory: Tachypnea.crackles at the lung bases bilaterally. No wheezes or rhonchi.  Gastrointestinal: Soft, non tender, and non distended with positive bowel sounds.  Central adiposity Genitourinary: No CVA tenderness. Musculoskeletal: Nontender with normal range of motion in all extremities. No cyanosis, or erythema of extremities. Neurologic:  Face is symmetric. Moving all extremities. No gross focal neurologic deficits . Skin: Skin is warm, dry.  No rash or ulcers hyperpigmented areas on both lower extremities Psychiatric: Mood and affect are normal    Labs on Admission: I have personally reviewed following labs and imaging studies  CBC: Recent Labs  Lab 11/15/21 1647  WBC 5.9  HGB 10.8*  HCT 37.2*  MCV 81.6  PLT 166   Basic Metabolic Panel: Recent Labs  Lab 11/12/21 1426 11/15/21 1304  11/15/21 1647  NA 128* 125* 124*  K 2.9* 3.4* 3.5  CL 88* 85* 85*  CO2 '30 29 28  ' GLUCOSE 172* 71 138*  BUN 54* 56* 54*  CREATININE 1.03 1.00 0.94  CALCIUM 8.6* 8.8* 9.1  MG  --   --  2.0   GFR: Estimated Creatinine Clearance: 60.1 mL/min (by C-G formula based on SCr of 0.94 mg/dL). Liver Function Tests: No results for input(s): AST, ALT, ALKPHOS, BILITOT, PROT, ALBUMIN in the last 168 hours. No results for input(s): LIPASE, AMYLASE in the last 168 hours. No results for input(s): AMMONIA in the last 168 hours. Coagulation Profile: No results for input(s): INR, PROTIME in the last 168 hours. Cardiac Enzymes: No results for input(s): CKTOTAL, CKMB, CKMBINDEX, TROPONINI in the last 168 hours. BNP (last 3 results) No results for input(s): PROBNP in the last 8760 hours. HbA1C: No results for input(s): HGBA1C in the last 72 hours. CBG: No results for input(s): GLUCAP in the last 168 hours. Lipid Profile: No results for input(s): CHOL, HDL, LDLCALC, TRIG, CHOLHDL, LDLDIRECT in the last 72 hours. Thyroid Function Tests: No results for input(s): TSH, T4TOTAL, FREET4, T3FREE, THYROIDAB in the last 72 hours. Anemia Panel: No results for input(s): VITAMINB12, FOLATE, FERRITIN, TIBC, IRON, RETICCTPCT in the last 72 hours. Urine analysis:    Component Value Date/Time   COLORURINE YELLOW 09/27/2021 1500   APPEARANCEUR CLEAR 09/27/2021 1500   APPEARANCEUR Clear 08/17/2012 1505   LABSPEC 1.015 09/27/2021 1500   LABSPEC 1.017 08/17/2012 1505   PHURINE 6.0 09/27/2021 1500   GLUCOSEU NEGATIVE 09/27/2021 1500   GLUCOSEU 50 mg/dL 08/17/2012 1505   HGBUR NEGATIVE 09/27/2021 1500   BILIRUBINUR NEGATIVE 09/27/2021 1500   BILIRUBINUR negative 08/21/2017 1112   BILIRUBINUR Negative 08/17/2012 1505   KETONESUR NEGATIVE 09/27/2021 1500   PROTEINUR NEGATIVE 09/27/2021 1500   UROBILINOGEN 0.2 08/21/2017 1112   NITRITE NEGATIVE 09/27/2021 1500   LEUKOCYTESUR NEGATIVE 09/27/2021 1500    LEUKOCYTESUR Negative 08/17/2012 1505    Radiological Exams on Admission: DG Chest 2 View  Result Date: 11/15/2021 CLINICAL DATA:  Shortness of breath. EXAM: CHEST - 2 VIEW COMPARISON:  08/16/2021 FINDINGS: Moderate-sized right pleural effusion, significantly increased. Associated mild right basilar atelectasis. These changes are obscuring the right heart borders with no gross change in size of the cardiac silhouette. Mild increase in size of a small left pleural effusion. Minimal left basilar atelectasis. Mildly tortuous and calcified thoracic aorta. Mild dextroconvex thoracic scoliosis and mild thoracic spine degenerative changes. IMPRESSION: 1. Moderate-sized right pleural effusion, significantly increased. 2. Mild increase in size of a small left pleural effusion. 3. Mild bibasilar atelectasis. Electronically Signed   By: Claudie Revering M.D.   On: 11/15/2021 17:27     Assessment/Plan Principal Problem:   Acute on chronic combined systolic and diastolic CHF (congestive heart failure) (HCC) Active Problems:   BPH with obstruction/lower urinary tract symptoms   Gastroesophageal reflux disease without esophagitis   Ischemic cardiomyopathy   COPD (chronic obstructive pulmonary disease) ([place])   Cardiomyopathy ([place])   Hyponatremia      Patient is a 88 year old male admitted to the hospital for acute on chronic combined systolic and diastolic dysfunction CHF    Acute on chronic combined systolic and diastolic dysfunction CHF/ischemic cardiomyopathy Patient presents for evaluation of worsening shortness of breath with exertion, orthopnea and bilateral lower extremity swelling as  well as a 13 pound weight gain over his baseline. Last known LVEF of 25 to 30% from a 2D echocardiogram done 10/22 Will place patient on Lasix 40 mg IV every 12 Continue metoprolol, spironolactone and losartan We will request cardiology consult.      Hyponatremia Most likely secondary to increased  extracellular volume from acute CHF Expect improvement in sodium levels with diuresis   COPD with chronic respiratory failure Stable and not acutely exacerbated Continue oxygen supplementation at 2 L continuous to maintain pulse oximetry greater than 92%     Diabetes mellitus Maintain consistent carbohydrate diet Glycemic control with sliding scale insulin    Coronary artery disease/ischemic cardiomyopathy Continue aspirin, Plavix, metoprolol, nitrates and statin    BPH Stable Continue Flomax     DVT prophylaxis: Lovenox  Code Status: DO NOT RESUSCITATE Family Communication: Greater than 50% of time was spent discussing patient's condition and plan of care with him at the bedside.  All questions and concerns have been addressed.  He verbalizes understanding and agrees with the plan.  CODE STATUS was discussed and he wishes to be a DNR.  He lists his daughter Cathie Beams as his healthcare power of attorney. Disposition Plan: Back to previous home environment Consults called: Cardiology Status:At the time of admission, it appears that the appropriate admission status for this patient is inpatient. This is judged to be reasonable and necessary to provide the required intensity of service to ensure the patient's safety given the presenting symptoms, physical exam findings, and initial radiographic and laboratory data in the context of their comorbid conditions. Patient requires inpatient status due to high intensity of service, high risk for further deterioration and high frequency of surveillance required.     Collier Bullock MD [facility]     11/15/2021, 8:02 PM

## 2021-11-15 NOTE — Telephone Encounter (Signed)
Spoke with patient regarding his labs from today after receiving IV lasix. Also wanted to make sure he was aware of cardiology appointment later today. He was aware of the cardiology appointment and is planning on being there.   Explained that his BNP was rising and his sodium and chloride levels were declining. BUN is rising although GFR still >60. Explained to patient to be prepared that he may need to be admitted for more support to help his heart and help remove this fluid that the outpatient therapies were no longer working.   He says that he really doesn't want to go to the ED but explained that it may be time and may be best for him. Cardiology will evaluate and discuss further but I wanted him to be prepared. His daughter Henry Lindsey was also called and informed.   Patient and daughter were appreciative of the information and will discuss this and his symptoms further at his appointment later today.

## 2021-11-15 NOTE — ED Notes (Signed)
Pt given a snack 

## 2021-11-15 NOTE — ED Triage Notes (Signed)
Pt to ED for shob for the past few days. Swelling to BLE. Wife reports had IV diuretic this afternoon, reports urine output. Wife reports "heart is not pumping properly" Reports minimal chest pain Speaking in short sentences.   CHF

## 2021-11-15 NOTE — ED Notes (Signed)
Pt reports sob.  No chest pain.  Sx for several months per pt.  Pt also reports swelling of feet/lower legs.   Pt states he has had weight gain too.  No n/v.  No fever.  Pt states he is coughing up phlegm.  Pt alert  speech clear.  Friend with pt.

## 2021-11-16 ENCOUNTER — Inpatient Hospital Stay (HOSPITAL_COMMUNITY)
Admit: 2021-11-16 | Discharge: 2021-11-16 | Disposition: A | Discharge status: Home / Self Care | Payer: Medicare Other | Attending: Medical | Admitting: Medical

## 2021-11-16 DIAGNOSIS — I5023 Acute on chronic systolic (congestive) heart failure: Secondary | ICD-10-CM

## 2021-11-16 DIAGNOSIS — J9611 Chronic respiratory failure with hypoxia: Secondary | ICD-10-CM | POA: Diagnosis present

## 2021-11-16 DIAGNOSIS — I5021 Acute systolic (congestive) heart failure: Secondary | ICD-10-CM

## 2021-11-16 DIAGNOSIS — T8453XA Infection and inflammatory reaction due to internal right knee prosthesis, initial encounter: Secondary | ICD-10-CM

## 2021-11-16 DIAGNOSIS — I272 Pulmonary hypertension, unspecified: Secondary | ICD-10-CM | POA: Diagnosis present

## 2021-11-16 DIAGNOSIS — J90 Pleural effusion, not elsewhere classified: Secondary | ICD-10-CM | POA: Diagnosis present

## 2021-11-16 LAB — HEPATIC FUNCTION PANEL
ALT: 11 U/L (ref 0–44)
AST: 34 U/L (ref 15–41)
Albumin: 2.9 g/dL — ABNORMAL LOW (ref 3.5–5.0)
Alkaline Phosphatase: 95 U/L (ref 38–126)
Bilirubin, Direct: 0.7 mg/dL — ABNORMAL HIGH (ref 0.0–0.2)
Indirect Bilirubin: 1.1 mg/dL — ABNORMAL HIGH (ref 0.3–0.9)
Total Bilirubin: 1.8 mg/dL — ABNORMAL HIGH (ref 0.3–1.2)
Total Protein: 7 g/dL (ref 6.5–8.1)

## 2021-11-16 LAB — GLUCOSE, CAPILLARY
Glucose-Capillary: 169 mg/dL — ABNORMAL HIGH (ref 70–99)
Glucose-Capillary: 141 mg/dL — ABNORMAL HIGH (ref 70–99)

## 2021-11-16 LAB — CBC
HCT: 34.8 % — ABNORMAL LOW (ref 39.0–52.0)
Hemoglobin: 10.3 g/dL — ABNORMAL LOW (ref 13.0–17.0)
MCH: 23.7 pg — ABNORMAL LOW (ref 26.0–34.0)
MCHC: 29.6 g/dL — ABNORMAL LOW (ref 30.0–36.0)
MCV: 80 fL (ref 80.0–100.0)
Platelets: 147 10*3/uL — ABNORMAL LOW (ref 150–400)
RBC: 4.35 MIL/uL (ref 4.22–5.81)
RDW: 18.7 % — ABNORMAL HIGH (ref 11.5–15.5)
WBC: 4.7 10*3/uL (ref 4.0–10.5)
nRBC: 0 % (ref 0.0–0.2)

## 2021-11-16 LAB — BASIC METABOLIC PANEL
Anion gap: 11 (ref 5–15)
BUN: 51 mg/dL — ABNORMAL HIGH (ref 8–23)
CO2: 28 mmol/L (ref 22–32)
Calcium: 8.5 mg/dL — ABNORMAL LOW (ref 8.9–10.3)
Chloride: 87 mmol/L — ABNORMAL LOW (ref 98–111)
Creatinine, Ser: 0.95 mg/dL (ref 0.61–1.24)
GFR, Estimated: >60 mL/min (ref >60)
Glucose, Bld: 105 mg/dL — ABNORMAL HIGH (ref 70–99)
Potassium: 3.6 mmol/L (ref 3.5–5.1)
Sodium: 126 mmol/L — ABNORMAL LOW (ref 135–145)

## 2021-11-16 LAB — CBG MONITORING, ED
Glucose-Capillary: 117 mg/dL — ABNORMAL HIGH (ref 70–99)
Glucose-Capillary: 121 mg/dL — ABNORMAL HIGH (ref 70–99)

## 2021-11-16 LAB — ECHOCARDIOGRAM LIMITED
Calc EF: 39.8 %
Height: 71 in
S' Lateral: 4.52 cm
Single Plane A2C EF: 39.1 %
Single Plane A4C EF: 39.8 %
Weight: 3172 oz

## 2021-11-16 MED ORDER — CEPHALEXIN 500 MG PO CAPS
500.0000 mg | ORAL_CAPSULE | Freq: Three times a day (TID) | ORAL | Status: DC
  Administered 2021-11-16 – 2021-11-21 (×15): 500 mg via ORAL
  Filled 2021-11-16 (×15): qty 1

## 2021-11-16 MED ORDER — RIFAMPIN 300 MG PO CAPS
300.0000 mg | ORAL_CAPSULE | Freq: Two times a day (BID) | ORAL | Status: DC
  Administered 2021-11-16 – 2021-11-21 (×9): 300 mg via ORAL
  Filled 2021-11-16 (×11): qty 1

## 2021-11-16 MED ORDER — FUROSEMIDE 10 MG/ML IJ SOLN
40.0000 mg | Freq: Two times a day (BID) | INTRAMUSCULAR | Status: DC
  Administered 2021-11-16 – 2021-11-17 (×2): 40 mg via INTRAVENOUS
  Filled 2021-11-16 (×2): qty 4

## 2021-11-16 MED ORDER — PERFLUTREN LIPID MICROSPHERE
1.0000 mL | INTRAVENOUS | Status: AC | PRN
  Administered 2021-11-16: 2 mL via INTRAVENOUS
  Filled 2021-11-16: qty 10

## 2021-11-16 NOTE — Progress Notes (Signed)
Progress Note   Patient: Henry Lindsey ZOX:[id number] DOB: 1933/05/23 DOA: 11/15/2021     1 DOS: the patient was seen and examined on 11/16/2021   Brief hospital course: 88 y.o. male with medical history significant for coronary artery disease status post stent angioplasty, history of chronic systolic/diastolic CHF with EF 25 to 30%, history of COPD on 2 L of oxygen continuous, insulin-dependent diabetes mellitus, hypertension, GERD, CVA who was sent to the ED on 11/15/2021 from his cardiologist office for evaluation of worsening shortness of breath from his baseline associated with orthopnea, increased abdominal girth, lower extremity swelling and weight gain of about 13 pounds. Admitted to the hospital for IV diuresis for acutely decompensated CHF.  Cardiology consulted.  Assessment and Plan * Acute on chronic combined systolic and diastolic CHF (congestive heart failure) ([place])- (present on admission) Patient presented with worsening shortness of breath with exertion, orthopnea and bilateral lower extremity swelling and weight gain of approximately 20 pounds since October.    Echo 08/10/21 showed LVEF 25-30%, G2DD, WMA, moderately reduced RV function, RV systolic pressure 81.1BJYN, mod to severe MR, mild to mod TR, mild aortic root dilation  -- Cardiology consulted -- Continue IV Lasix 40 mg twice daily -- Metoprolol on hold due to soft blood pressure -- Appears no longer on losartan by med history list --Not on SGLT2 inhibitor, consider --Previously did not tolerate Entresto due to hypotension -- Strict I/O's and daily weights -- Monitor renal function and electrolytes -- Consider thoracentesis if breathing does not improve with diuresis  Pleural effusion- (present on admission) Admission chest x-ray showed moderate sized right pleural effusion significantly increased in size, mild increase in size of a small left pleural effusion.  Associated bibasilar atelectasis. Oxygen requirements are  stable. -- Consider thoracentesis if breathing not improved with diuretics  Ischemic cardiomyopathy- (present on admission) Management as outlined above  Chronic respiratory failure with hypoxia ([place])- (present on admission) Baseline oxygen requirement of 2 L/min in the setting of COPD.  Oxygen requirement appears stable at this time.  Monitor.  COPD (chronic obstructive pulmonary disease) ([place])- (present on admission) Chronically on 2 L minute home oxygen.  No signs or symptoms of exacerbation at this time.  Monitor closely.  Continue bronchodilators as needed  Hyponatremia- (present on admission) Presented with sodium 124.  Likely hypervolemic in the setting of acutely decompensated CHF and volume overload.  Expect sodium level to improve with diuresis. 1/27: Sodium 126, improving  Infection of prosthetic right knee joint ([place]) Patient is following with ID and on Keflex and rifampin for 6 more weeks. -- LFTs pending, hold rifampin and resume if LFTs are normal (previously elevated) --Continue Keflex -- ID follow-up as scheduled  CKD (chronic kidney disease), stage IIIa- (present on admission) Currently normal renal function with creatinine on admission 1.00.  Creatinine today 0.95. Monitor renal function with diuresis.  Type II diabetes mellitus with renal manifestations ([place])- (present on admission) Appears to be on 70/30 insulin and glipizide at home. --Sliding scale NovoLog and CBGs. --Consider SGLT2 inhibitor given CHF. --Hold glipizide.  Gastroesophageal reflux disease without esophagitis- (present on admission) Continue Protonix  BPH with obstruction/lower urinary tract symptoms- (present on admission) Continue Flomax  BPH (benign prostatic hyperplasia) Stable continue Flomax     Subjective: Patient seen in the ED holding for a bed today.  Granddaughter at bedside.  He reports significant urine output with IV Lasix.  Reports overall feeling better.  Still having a  cough productive of clear sputum.  No fevers or chills or other acute complaints at this time.  Objective Vitals reviewed and notable for hypotension earlier this morning but improved to 110's over 60s.  Overnight BPs as low as 83/52.  O2 sats in the upper 90s to 100% on 2 L/min nasal cannula O2.  Heart rate mildly bradycardic with heart rate upper 40s to mid 50s overnight, improved to 60s-70s later today.   General exam: awake, alert, no acute distress HEENT: moist mucus membranes, hearing grossly normal  Respiratory system: CTAB with diminished bases right greater than left, no wheezes, rales or rhonchi, normal respiratory effort, on 2 L/min [place] O2. Cardiovascular system: normal S1/S2, RRR, evidence of improving lower extremity edema with skin wrinkling and only trace pitting Gastrointestinal system: soft, NT, ND, no HSM felt, +bowel sounds. Central nervous system: A&O x3. no gross focal neurologic deficits, normal speech Extremities: moves all, distal lower extremities are symmetrically erythematous but not warm to touch Psychiatry: normal mood, congruent affect, judgement and insight appear normal   Data Reviewed:  Labs reviewed and notable for sodium improved from 1 24-1 26, chloride 87, glucose 105, BUN 51, creatinine 0.95 stable, albumin 2.9, total bili 1.8, hemoglobin stable 10.3, mild thrombocytopenia with platelets 147 down from 163  Family Communication: Granddaughter at bedside on rounds  Disposition: Status is: Inpatient  Remains inpatient appropriate because: Remains on IV therapies as outlined above with ongoing evaluation by cardiology.         Time spent: 35 minutes  Author: Ezekiel Slocumb, DO 11/16/2021 4:53 PM  For on call review [email or web address].

## 2021-11-16 NOTE — Assessment & Plan Note (Signed)
-   Continue Flomax 

## 2021-11-16 NOTE — Assessment & Plan Note (Signed)
Chronically on 2 L minute home oxygen.  No signs or symptoms of exacerbation at this time.  Monitor closely.  Continue bronchodilators as needed

## 2021-11-16 NOTE — Assessment & Plan Note (Signed)
Baseline oxygen requirement of 2 L/min in the setting of COPD.  Oxygen requirement appears stable at this time.  Monitor.

## 2021-11-16 NOTE — Progress Notes (Signed)
*  PRELIMINARY RESULTS* Echocardiogram 2D Echocardiogram has been performed.  Henry Lindsey 11/16/2021, 1:55 PM

## 2021-11-16 NOTE — ED Notes (Signed)
Pt advised his legs were very restless and he takes hydrocodone for that. Will go pull and administer.

## 2021-11-16 NOTE — Consult Note (Signed)
Cardiology Consultation:   Patient ID: Henry Lindsey MRN: [id number]; DOB: 04-13-1933  Admit date: 11/15/2021 Date of Consult: 11/16/2021  PCP:  Baxter Hire, MD   [facility] Providers Cardiologist:  Ida Rogue, MD   {  Patient Profile:   Henry Lindsey is a 88 y.o. male with a hx of CAD s/p high risk Impella supported PCI/atherectomy to the LAD in 09/2021, chronic combined systolic and diastolic CHF, ICM, CVA, DM, HTN, HLD, PAD, carotid artery disease, anemia, lower extremity cellulitis, GERD who is being seen 11/16/2021 for the evaluation of acute CHF at the request of Dr. Arbutus Ped.  History of Present Illness:   Henry Lindsey is followed by Dr. Rockey Situ for the above cardiac issues.  The setting of respiratory failure, he was diagnosed with bronchitis and pneumonia in 10/09/2020.  Echo 10/2020 showed an EF of 35% with normal RVSP, moderate MR, mild TR, and a mild pericardial effusion.  He was placed on Lasix and referred to cardiology.  He subsequently underwent diagnostic cardiac cath in February 2022 which revealed severe diffuse LAD, diagonal, and obtuse marginal disease with an occluded RCA.  Cardiac output and index were normal.  LVEF was 35%.  He was evaluated by CT surgery and felt to be a poor surgical candidate, and therefore was medically managed.  Relative hypotension has precluded escalation of guideline directed medical therapy.  It is noted he previously had a rash with SGLT2 inhibitors.  He was admitted to the hospital 08/09/2021 with a non-STEMI and volume overload.  He was diuresed with IV Lasix.  Echo showed EF of 25 to 30%, global hypokinesis with severe hypokinesis of the anterior, septal, and apical region.  The inferior wall was best preserved.  Grade 2 diastolic dysfunction, moderately reduced RV systolic function and moderately enlarged RV cavity size, mildly elevated PASP estimated at 41.6 mmHg moderately dilated left atrium, moderate to severe mitral regurgitation,  mild to moderate tricuspid regurgitation, mildly dilated aortic root measuring 39 mm, and an estimated right atrial pressure of 15 mmHg.  Subsequent R/LHC along with abdominal aortogram on 08/14/2021 demonstrated severe multivessel CAD including 80 to 90% ostial LAD stenosis with CTO of the proximal RCA that was previously noted on cath in 11/2020.  Moderately elevated left heart, right heart, and pulmonary artery pressures.  Moderately reduced cardiac output and index.  An ectatic abdominal aorta/common/external iliac arteries, and common femoral arteries were without significant disease.  Focal 70% right internal iliac artery stenosis was noted.  There was 50% ostial stenosis of the right renal artery.  Given findings, the patient was transferred to [facility] where he underwent successful Impella supported high risk PCI of the ostial LAD with atherectomy and drug eluding stent placement.  The stent extended 1 to 2 mm into the left main coronary artery to ensure coverage of the ostium.  There was normal flow into the left circumflex.  Moderately elevated LVEDP at 24 mmHg at the beginning of the case was noted.    He has not undergone repeat echo since undergoing PCI in October 2022.  He was admitted to the hospital 10/09/2021 with knee arthroplasty infection requiring.  Seen 09/25/22 for hosptial follow-up. HE had LLE on exam and lasix was increased to 22m BID x 3days. He subsequently saw the heart failure clinic multiple times an went in for IV lasix a couple times.   SDuke Salvia DIdolina Primer PA, 1/26 in the office found to be volume overlaoded and was admitted from the office.  In the ER the patient reports since the last hospitalization he noted swelling in his abdomen. Also reported increasing shortness of breath. No chest pain. Also reported orthopnea and pnd. He was taking lasix daily.   In the ER sodium 124, potassium 3.5, bicarb 28, Scr 0.94, BUN 54, BNP 2065. HS trop 28. WBC 5.9, Hgb 10.8, HCT 37.2,  MCV 81.8, plt 163. Respiratory panel negative. CXR with moderate right sized pleural effusion, small left pleural effusion, mild bibasilar atelectasis. EKG showed NSR with frequent PVCs, no acute changes . He was given lasix and admitted.   Past Medical History:  Diagnosis Date   Anginal pain ([place])    Aortic atherosclerosis (HCC)    Arthritis    BPH (benign prostatic hyperplasia)    CAD (coronary artery disease)    a.) 2/22 Cath: LM 50d, LAD sev ost dz, mod-sev prox/mid dz. D1 sev dz. LCX mild prox dzs, OM2 mod-sev dz, RCA 100p. RPL and RPDA fill via L-R collats. EF 35%. Seen by CVTS->Med mgmt. b.) R/LHC 08/14/21: 30% m-d-LM, 85% oLAD, 50% pLAD, 35% dLAD, 80% D1, 50% pLCx, 70% OM1, 100% p-dRCA -> trans to [facility]. c.) 08/15/21 Impella supported HIGH RISK PCI with atherectomy. 2.75 x 50m Onyx Frontier DES oLAD.   Carotid artery stenosis    a. 11/2010 U/S: <50% bilaterally; b. 03/2021 40-59% bilat ICA stenoses.   CHF (congestive heart failure) (HCC)    COPD (chronic obstructive pulmonary disease) ([place]    noted CT 10/05/16 former smoker quit age 88   CVA (cerebral infarction) 11/2010   a.) RIGHT thalamic lacunar   GERD (gastroesophageal reflux disease)    Heart murmur    HFrEF (heart failure with reduced ejection fraction) ([place]    a. 10/2020 Echo: EF 35%.   Hyperlipidemia    Hypertension    Ischemic cardiomyopathy    a. 10/2020 Echo: EF 35%. Nl RVSP. Mod MR. Mild TR; b. 11/2020 Cath:  CO/CI 5.05/2.49. LV gram: EF 35%.   Lower extremity cellulitis    NSTEMI (non-ST elevated myocardial infarction) ([facility] 08/09/2021   a.) R/LHC 08/14/2021: 30% m-d-LM, 85% oLAD, 50% pLAD, 35% dLAD, 80% D1, 50% pLCx, 70% OM1, 100% p-dRCA -> transfer to [facility]. b.) 08/15/2021 Impella supported HIGH RISK PCI with atherectomy. 2.75 x 243mOnyx Frontier DES to [facility]  Peripheral vascular disease in diabetes mellitus ([facility]   a. 06/2012 95% occlusion s/p PTA R SFA (Dr. DeLucky Cowboy b. 03/2021 ABIs: R 1.02, L 0.96.   Pneumonia     Type 2 [facility] mellitus [facility] with insulin [facility]    Past Surgical History:  Procedure Laterality Date   ABDOMINAL AORTOGRAM N/A 08/14/2021   Procedure: ABDOMINAL AORTOGRAM;  Surgeon: EnNelva BushMD;  [facility];  Service: Cardiovascular;  Laterality: N/A;   CHOLECYSTECTOMY N/A 11/13/2016   Procedure: LAPAROSCOPIC CHOLECYSTECTOMY WITH INTRAOPERATIVE CHOLANGIOGRAM;  Surgeon: JoOlean ReeMD;  Location: [facility] OR[facility];  Service: General;  Laterality: N/A;   COLONOSCOPY WITH PROPOFOL N/A 02/10/2018   Procedure: COLONOSCOPY WITH PROPOFOL;  Surgeon: WoLucilla LameMD;  Location: [facility] ENDOSCOPY;  Service: Endoscopy;  Laterality: N/A;   CORONARY ATHERECTOMY N/A 08/15/2021   Procedure: CORONARY ATHERECTOMY;  Surgeon: ArWellington HampshireMD;  [place] [facility];  Service: Cardiovascular;  Laterality: N/A;   CORONARY STENT INTERVENTION W/IMPELLA N/A 08/15/2021   Procedure: Coronary Stent Intervention w/Impella;  Surgeon: ArWellington HampshireMD;  [place] [facility];  Service: Cardiovascular;  Laterality: N/A;  ERCP N/A 11/17/2016   Procedure: ENDOSCOPIC RETROGRADE CHOLANGIOPANCREATOGRAPHY (ERCP);  Surgeon: Irene Shipper, MD;  Location[facility];  Service: Endoscopy;  Laterality: N/A;   ERCP N/A 02/04/2017   Procedure: ENDOSCOPIC RETROGRADE CHOLANGIOPANCREATOGRAPHY (ERCP) Stent removal;  Surgeon: Lucilla Lame, MD;  Location: [facility] ENDOSCOPY;  Service: Endoscopy;  Laterality: N/A;   IR GENERIC HISTORICAL  10/06/2016   IR PERC CHOLECYSTOSTOMY 10/06/2016 Aletta Edouard, MD [facility]   JOINT REPLACEMENT Left 2014   left knee   RIGHT/LEFT HEART CATH AND CORONARY ANGIOGRAPHY N/A 11/24/2020   Procedure: RIGHT/LEFT HEART CATH AND CORONARY ANGIOGRAPHY;  Surgeon: Minna Merritts, MD;  Location: [place] CV LAB;  Service: Cardiovascular;  Laterality: N/A;   RIGHT/LEFT HEART CATH AND CORONARY ANGIOGRAPHY N/A 08/14/2021   Procedure: RIGHT/LEFT HEART CATH AND CORONARY  ANGIOGRAPHY;  Surgeon: Nelva Bush, MD;  Location: [place] CV LAB;  Service: Cardiovascular;  Laterality: N/A;   TOTAL KNEE ARTHROPLASTY Right 08/08/2020   Procedure: TOTAL KNEE ARTHROPLASTY;  Surgeon: Lovell Sheehan, MD;  Location: [facility] ORS;  Service: Orthopedics;  Laterality: Right;   TOTAL KNEE REVISION Right 10/01/2021   Procedure: IRRIGATION AND DEBRIDMENT RIGHT KNEE WITH POLY EXCHANGE;  Surgeon: Lovell Sheehan, MD;  Location: [facility] ORS;  Service: Orthopedics;  Laterality: Right;   UPPER GASTROINTESTINAL ENDOSCOPY     WRIST SURGERY       Home Medications:  Prior to Admission medications   Medication Sig Start Date End Date Taking? Authorizing Provider  ascorbic acid (VITAMIN C) 500 MG tablet Take 500 mg by mouth daily.    Yes [provider]  aspirin EC 81 MG tablet Take 1 tablet (81 mg total) by mouth daily. Swallow whole. 01/31/21  Yes Minna Merritts, MD  atorvastatin (LIPITOR) 40 MG tablet Take 1 tablet (40 mg total) by mouth daily. 12/08/20  Yes Gollan, Kathlene November, MD  cholecalciferol (VITAMIN D) 25 MCG (1000 UNIT) tablet Take 1,000 Units by mouth daily.   Yes [provider]  clopidogrel (PLAVIX) 75 MG tablet TAKE 1 TABLET(75 MG) BY MOUTH DAILY WITH BREAKFAST 09/11/21  Yes Gollan, Kathlene November, MD  cyclobenzaprine (FLEXERIL) 5 MG tablet Take 5-10 mg by mouth daily as needed for muscle spasms. 07/31/21  Yes [provider]  diclofenac Sodium (VOLTAREN) 1 % GEL Apply 2 g topically 2 (two) times daily as needed (pain).   Yes [provider]  docusate sodium (COLACE) 100 MG capsule Take 1 capsule (100 mg total) by mouth 2 (two) times daily. 10/04/21  Yes Carlynn Spry, PA-C  furosemide (LASIX) 40 MG tablet Take 1 tablet (40 mg total) by mouth 2 (two) times daily. 10/19/21 01/17/22 Yes Hackney, Otila Kluver A, FNP  gabapentin (NEURONTIN) 400 MG capsule Take 400 mg by mouth 2 (two) times daily.   Yes [provider]  glipiZIDE (GLUCOTROL) 10 MG  tablet Take 10 mg by mouth daily before breakfast.   Yes [provider]  INS SYRINGE/NEEDLE 1CC/28G (B-D INSULIN SYRINGE 1CC/28G) 28G X 1/2" 1 ML MISC USE TO ADMINISTER INSULIN DAILY 02/13/16  Yes [name], MD  insulin aspart protamine- aspart (NOVOLOG MIX 70/30) (70-30) 100 UNIT/ML injection Inject 0.15 mLs (15 Units total) into the skin 2 (two) times daily with a meal. 08/14/21  Yes Fritzi Mandes, MD  latanoprost (XALATAN) 0.005 % ophthalmic solution Place 1 drop into both eyes at bedtime. 11/01/15  Yes [provider]  losartan (COZAAR) 25 MG tablet Take 0.5 tablets (12.5 mg total) by mouth daily.  10/30/21 01/28/22 Yes Darylene Price A, FNP  metoprolol succinate (TOPROL-XL) 50 MG 24 hr tablet Take 1 tablet (50 mg total) by mouth daily. Take with or immediately following a meal. 08/21/21  Yes Bhagat, Bhavinkumar, PA  Multiple Vitamin (MULTIVITAMIN WITH MINERALS) TABS tablet Take 1 tablet by mouth in the morning.   Yes [provider]  ONE TOUCH ULTRA TEST test strip USE 1 STRIP TO TEST THREE TIMES DAILY AS DIRECTED 08/31/18  Yes [name], MD  [facility] DELICA LANCETS 99J MISC Use three times daily to check blood sugar. 10/18/15  Yes [name], MD  pantoprazole (PROTONIX) 40 MG tablet Take 40 mg by mouth daily.   Yes [provider]  potassium chloride SA (KLOR-CON M) 20 MEQ tablet Take 1 tablet (20 mEq total) by mouth 2 (two) times daily. Take with furosemide 11/13/21  Yes Hackney, Tina A, FNP  spironolactone (ALDACTONE) 25 MG tablet Take 0.5 tablets (12.5 mg total) by mouth daily. 08/21/21  Yes Bhagat, Bhavinkumar, PA  tamsulosin (FLOMAX) 0.4 MG CAPS capsule Take 0.4 mg by mouth daily after breakfast.   Yes [provider]  timolol (TIMOPTIC) 0.5 % ophthalmic solution Place 1 drop into both eyes in the morning. 12/19/17  Yes [provider]  tiZANidine (ZANAFLEX) 4 MG tablet Take 4 mg by mouth at bedtime. 10/29/21  Yes [provider]  vitamin B-12 (CYANOCOBALAMIN) 500 MCG tablet Take 500 mcg by mouth daily.   Yes [provider]  vitamin E 180 MG (400 UNITS) capsule Take 400 Units by mouth daily.   Yes [provider]  albuterol (VENTOLIN HFA) 108 (90 Base) MCG/ACT inhaler Inhale 2 puffs into the lungs every 6 (six) hours as needed for shortness of breath or wheezing. 10/29/20   [provider]  Blood Glucose Monitoring Suppl (ONE TOUCH ULTRA SYSTEM KIT) w/Device KIT 1 kit by Does not apply route once. Use DX code E11.59 10/18/15   [name], MD  HYDROcodone-acetaminophen (NORCO/VICODIN) 5-325 MG tablet Take 1 tablet by mouth every 4 (four) hours as needed for moderate pain (pain score 4-6). 10/04/21   Carlynn Spry, PA-C  metolazone (ZAROXOLYN) 2.5 MG tablet Take 1 tablet (2.5 mg total) by mouth 2 (two) times a week. Take this 1/2 hour before furosemide Patient not taking: Reported on 11/15/2021 11/15/21 02/13/22  Alisa Graff, FNP  nitroGLYCERIN (NITROSTAT) 0.4 MG SL tablet Place 1 tablet (0.4 mg total) under the tongue every 5 (five) minutes as needed for chest pain. MAXIMUM 3 TABLETS 12/08/20   Minna Merritts, MD  oxyCODONE (OXY IR/ROXICODONE) 5 MG immediate release tablet Take 5 mg by mouth every 4 (four) hours as needed for severe pain.    [provider]    Inpatient Medications: Scheduled Meds:  ascorbic acid  500 mg Oral Daily   aspirin EC  81 mg Oral Daily   atorvastatin  40 mg Oral Daily   cholecalciferol  1,000 Units Oral Daily   clopidogrel  75 mg Oral Daily   docusate sodium  100 mg Oral BID   enoxaparin (LOVENOX) injection  40 mg Subcutaneous Q24H   furosemide  40 mg Intravenous Q12H   gabapentin  400 mg Oral BID   insulin aspart  0-15 Units Subcutaneous TID WC   latanoprost  1 drop Both Eyes QHS   losartan  12.5 mg Oral Daily   metoprolol succinate  50 mg Oral Daily   multivitamin with minerals  1 tablet Oral  q AM   pantoprazole  40 mg Oral Daily    potassium chloride SA  20 mEq Oral BID   spironolactone  12.5 mg Oral Daily   tamsulosin  0.4 mg Oral QPC breakfast   timolol  1 drop Both Eyes Daily   tiZANidine  4 mg Oral QHS   vitamin B-12  500 mcg Oral Daily   Continuous Infusions:  PRN Meds: acetaminophen **OR** acetaminophen, albuterol, cyclobenzaprine, HYDROcodone-acetaminophen, nitroGLYCERIN, ondansetron **OR** ondansetron (ZOFRAN) IV  Allergies:    Allergies  Allergen Reactions   Jardiance [Empagliflozin]     Rash all over and lips turn purple.    Metformin And Related Diarrhea   Trazodone Other (See Comments)    Extreme hallucinations/ psychosis    Torsemide Rash    "Skin reaction"    Social History:   Social History   Socioeconomic History   Marital status: Widowed    Spouse name: Enid Derry, deceased   Number of children: 2   Years of education: 81   Highest education level: Not on file  Occupational History    Employer: retired  Tobacco Use   Smoking status: Former    Packs/day: 0.50    Years: 33.00    Pack years: 16.50    Types: Cigarettes    Quit date: 04/21/1968    Years since quitting: 53.6   Smokeless tobacco: Never  Vaping Use   Vaping Use: Never used  Substance and Sexual Activity   Alcohol use: No   Drug use: No   Sexual activity: Not Currently    Partners: Female    Birth control/protection: None  Other Topics Concern   Not on file  Social History Narrative   Widowed in 2014; dating again , stays with girlfriend house   Social Determinants of Health   Financial Resource Strain: Not on file  Food Insecurity: Not on file  Transportation Needs: Not on file  Physical Activity: Not on file  Stress: Not on file  Social Connections: Not on file  Intimate Partner Violence: Not on file    Family History:    Family History  Problem Relation Age of Onset   Diabetes Mother      ROS:  Please see the history of present illness.   All other ROS reviewed and negative.     Physical  Exam/Data:   Vitals:   11/16/21 0300 11/16/21 0400 11/16/21 0500 11/16/21 0600  BP: (!) 97/47 110/64 (!) 97/51 113/65  Pulse: (!) 54 64 (!) 54 67  Resp: '13 20 12 15  ' Temp:      TempSrc:      SpO2: 100% 99% 100% 98%    Intake/Output Summary (Last 24 hours) at 11/16/2021 0723 Last data filed at 11/16/2021 0414 Gross per 24 hour  Intake --  Output 900 ml  Net -900 ml   Last 3 Weights 11/15/2021 11/12/2021 10/30/2021  Weight (lbs) 198 lb 4 oz 193 lb 4 oz 194 lb  Weight (kg) 89.926 kg 87.658 kg 87.998 kg  Some encounter information is confidential and restricted. Go to Review Flowsheets activity to see all data.     There is no height or weight on file to calculate BMI.  General:  Well nourished, well developed, in no acute distress HEENT: normal Neck: + JVD Vascular: No carotid bruits; Distal pulses 2+ bilaterally Cardiac:  normal S1, S2; RRR; no murmur  Lungs:  +crackles at bases, +wheezing  Abd: soft, nontender, no hepatomegaly  Ext: mild edema Musculoskeletal:  No  deformities, BUE and BLE strength normal and equal Skin: warm and dry  Neuro:  CNs 2-12 intact, no focal abnormalities noted Psych:  Normal affect   EKG:  The EKG was personally reviewed and demonstrates:  NSR< frequent PVCs, Qtc 423m, nonspecific T wave changes, RAD Telemetry:  Telemetry was personally reviewed and demonstrates:  SR/SB, frequent PVCs, HR uppr 40-60s.   Relevant CV Studies:  Coronary arthrectomy 08/15/2021:   Mid LM to Dist LM lesion is 30% stenosed.   Ost LAD lesion is 85% stenosed.   Prox LAD lesion is 50% stenosed.   Dist LAD lesion is 35% stenosed.   Prox Cx lesion is 50% stenosed.   Prox RCA to Dist RCA lesion is 100% stenosed.   1st Diag lesion is 80% stenosed.   1st Mrg lesion is 70% stenosed.   A drug-eluting stent was successfully placed using a STENT ONYX FRONTIER 2.75X26.   Post intervention, there is a 0% residual stenosis.   Post intervention, there is a 0% residual stenosis.    Post intervention, there is a 0% residual stenosis.   Successful Impella supported high risk PCI of the ostial LAD with atherectomy and drug-eluting stent placement.  The stent extended 1 to 2 mm into the left main coronary artery to ensure coverage of the ostium.  There was normal flow into the  left circumflex. Moderately elevated left ventricular end-diastolic pressure at 24 mmHg at the beginning of the case.   Recommendations: Dual antiplatelet therapy for at least 12 months and preferably longer. Aggressive treatment of risk factors. Optimize heart failure therapy.  Monitor renal function closely.  135 mL of contrast was used. __________   [facility] Conclusions: Severe multivessel coronary artery disease, including 80-90% ostial LAD stenosis and chronic total occlusion of the proximal RCA, a seen on prior catheterization in 11/2020. Moderately elevated left heart, right heart, and pulmonary artery pressures. Moderately-severely reduced cardiac output/index. Ectatic abdominal aorta common/external iliac arteries, and common femoral arteries without significant disease.  Focal 70% right internal iliac artery stenosis is noted. 50% ostial stenosis of right renal artery.   Recommendations: [facility] PCI to LMCA/ostial LAD.  Given heavy calcification and reduced LVEF, atherectomy and Impella support will need to be considered.  I will load the patient with clopidogrel 600 mg today, followed by 75 mg daily thereafter. Maintain net even to slightly negative fluid balance following contrast exposure today and chronic kidney disease. Escalate goal-directed medical therapy of acute on chronic HFrEF, as tolerated. Aggressive secondary prevention of coronary artery disease. __________   2D echo 08/10/2021: 1. Left ventricular ejection fraction, by estimation, is 25 to 30%. The  left ventricle has severely decreased function. The left ventricle   demonstrates global hypokinesis with severe hypokinesis of the anterior,  septal, and apical region. Inferior wall  best preserved. Left ventricular diastolic parameters are consistent with  Grade II diastolic dysfunction (pseudonormalization). The average left  ventricular global longitudinal strain is -6.3 %. The global longitudinal  strain is abnormal.   2. Right ventricular systolic function is moderately reduced. The right  ventricular size is moderately enlarged. There is mildly elevated  pulmonary artery systolic pressure. The estimated right ventricular  systolic pressure is 409.4mmHg.   3. Left atrial size was moderately dilated.   4. The mitral valve is normal in structure. Moderate to severe mitral  valve regurgitation.   5. Tricuspid valve regurgitation is mild to moderate.   6. Aortic  dilatation noted. There is mild dilatation of the aortic root,  measuring 39 mm.   7. The inferior vena cava is dilated in size with <50% respiratory  variability, suggesting right atrial pressure of 15 mmHg.  Laboratory Data:  High Sensitivity Troponin:   Recent Labs  Lab 11/15/21 1647 11/15/21 1845  TROPONINIHS 28* 30*     Chemistry Recent Labs  Lab 11/12/21 1426 11/15/21 1304 11/15/21 1647  NA 128* 125* 124*  K 2.9* 3.4* 3.5  CL 88* 85* 85*  CO2 '30 29 28  ' GLUCOSE 172* 71 138*  BUN 54* 56* 54*  CREATININE 1.03 1.00 0.94  CALCIUM 8.6* 8.8* 9.1  MG  --   --  2.0  GFRNONAA >60 >60 >60  ANIONGAP '10 11 11    ' No results for input(s): PROT, ALBUMIN, AST, ALT, ALKPHOS, BILITOT in the last 168 hours. Lipids No results for input(s): CHOL, TRIG, HDL, LABVLDL, LDLCALC, CHOLHDL in the last 168 hours.  Hematology Recent Labs  Lab 11/15/21 1647  WBC 5.9  RBC 4.56  HGB 10.8*  HCT 37.2*  MCV 81.6  MCH 23.7*  MCHC 29.0*  RDW 18.7*  PLT 163   Thyroid No results for input(s): TSH, FREET4 in the last 168 hours.  BNP Recent Labs  Lab 11/15/21 1304 11/15/21 1647  BNP  2,665.8* 2,065.7*    DDimer No results for input(s): DDIMER in the last 168 hours.   Radiology/Studies:  DG Chest 2 View  Result Date: 11/15/2021 CLINICAL DATA:  Shortness of breath. EXAM: CHEST - 2 VIEW COMPARISON:  08/16/2021 FINDINGS: Moderate-sized right pleural effusion, significantly increased. Associated mild right basilar atelectasis. These changes are obscuring the right heart borders with no gross change in size of the cardiac silhouette. Mild increase in size of a small left pleural effusion. Minimal left basilar atelectasis. Mildly tortuous and calcified thoracic aorta. Mild dextroconvex thoracic scoliosis and mild thoracic spine degenerative changes. IMPRESSION: 1. Moderate-sized right pleural effusion, significantly increased. 2. Mild increase in size of a small left pleural effusion. 3. Mild bibasilar atelectasis. Electronically Signed   By: Claudie Revering M.D.   On: 11/15/2021 17:27     Assessment and Plan:   Acute on chronic combined systolic and diastolic CHF ICM Pulmonary HTN Moderate left pleural effusion, small right pleural effusion - presented with worsening SOB, orthopnea, LLE for the last few weeks despite multiple rounds of IV lasix by heart failure clinic, was on lasix 64m daily. Weight up ?22lbs - In the ER BNP >2000. HS trop mildly elevated. CXR with mod left effusion, small rt effusion - IV lasix 426mBID - UOP -1L - continue PTA medications Toprol, Losartan, spiro - he didn't tolerate Entresto due to hypotension - Echo 08/10/21 showed LVEF 25-30%, G2DD, WMA, moderately reduced RV function, RV systolic pressure [id number] to severe MR, mild to mod TR, mild aortic root dilation - monitor I/Os, kidney function, and daily weights - may need inotropic support for diuresis - may need tap if breathing does not improve  CAD - no chest pain reports - HS trop mildly elevated int he setting of acute heart failure, suspect demand ischemia - continue DAPT with ASA  and Plavix - continue statin and BB - no ischemic evaluation at this time  COPD - no acute exacerbation  HTN - intermittently soft - monitor with diuresis  HLD - LDL 29 07/2021. Continue statin  Hyponatremia - likely 2/2 to volume overload  Anemia - Hgb 10- range, amy need further  work-up  For questions or updates, please contact [place] Please consult [email or web address] for contact info under    Signed, Damyon Mullane Ninfa Meeker, PA-C  11/16/2021 7:23 AM

## 2021-11-16 NOTE — Assessment & Plan Note (Addendum)
Patient presented with worsening shortness of breath with exertion, orthopnea and bilateral lower extremity swelling and weight gain of approximately 20 pounds since October.    Echo 08/10/21 showed LVEF 25-30%, G2DD, WMA, moderately reduced RV function, RV systolic pressure 00.3BCWU, mod to severe MR, mild to mod TR, mild aortic root dilation.  High PVC burden may be contributing. -- Cardiology consulted, appreciate recommendations -- IV Lasix 40 IV BID --Metolazone planned today to augment diuresis -- Metoprolol on hold due to soft blood pressure --On low-dose spironolactone -- Appears no longer on losartan likely due to blood pressure --Not on SGLT2 inhibitor, consider --Previously did not tolerate Entresto due to hypotension -- Strict I/O's and daily weights -- Monitor renal function and electrolytes  1/31: Patient reports breathing improved but still with orthopnea

## 2021-11-16 NOTE — TOC Progression Note (Signed)
Transition of Care [facility]) - Progression Note    Patient Details  Name: JVION TURGEON MRN: [id number] Date of Birth: Feb 26, 1933  Transition of Care [facility]) CM/SW Contact  Shelbie Hutching, RN Phone Number: 11/16/2021, 4:28 PM  Clinical Narrative:    Corene Cornea with [facility] given referral for [facility] RN and PT.     Expected Discharge Plan: [place] Barriers to Discharge: Continued Medical Work up  Expected Discharge Plan and Services Expected Discharge Plan: [place]   Discharge Planning Services: CM Consult   Living arrangements for the past 2 months: Single Family Home                 DME Arranged: N/A DME Agency: NA                   Social Determinants of Health (SDOH) Interventions    Readmission Risk Interventions Readmission Risk Prevention Plan 11/16/2021 08/17/2021  Transportation Screening Complete Complete  PCP or Specialist Appt within 3-5 Days - Complete  HRI or [place] - Complete  Social Work Consult for [place] Planning/Counseling - Complete  Palliative Care Screening - Not Applicable  Medication Review Press photographer) Complete Complete  PCP or Specialist appointment within 3-5 days of discharge Complete -  [place] or Home Care Consult Complete -  SW Recovery Care/Counseling Consult Complete -  Palliative Care Screening Not Applicable -  [place] Not Applicable -  Some recent data might be hidden

## 2021-11-16 NOTE — Assessment & Plan Note (Addendum)
Admission chest x-ray showed moderate sized right pleural effusion significantly increased in size, mild increase in size of a small left pleural effusion.  Associated bibasilar atelectasis. Oxygen requirements are stable. Status postthoracentesis at the bedside on 1/29 by Dr. Bridgett Larsson. Cytology Negative for malignancy -- Follow pleural fluid cultures, neg to date

## 2021-11-16 NOTE — Assessment & Plan Note (Addendum)
Management as outlined above. Cardiology following.  Continue aspirin Plavix.

## 2021-11-16 NOTE — Assessment & Plan Note (Signed)
Stable continue Flomax

## 2021-11-16 NOTE — Assessment & Plan Note (Addendum)
Currently normal renal function with creatinine on admission 1.00.   Creatinine today stable 0.86. Monitor renal function with diuresis, so far Cr improving.

## 2021-11-16 NOTE — Assessment & Plan Note (Addendum)
Presented with sodium 124.  Likely hypervolemic in the setting of acutely decompensated CHF and volume overload.   Sodium levels improving with diuresis as expected 1/31: sodium 130>>129 today. Had less UO yesterday.

## 2021-11-16 NOTE — Assessment & Plan Note (Signed)
Continue Protonix °

## 2021-11-16 NOTE — Hospital Course (Addendum)
88 y.o. male with medical history significant for coronary artery disease status post stent angioplasty, history of chronic systolic/diastolic CHF with EF 25 to 30%, history of COPD on 2 L of oxygen continuous, insulin-dependent diabetes mellitus, hypertension, GERD, CVA who was sent to the ED on 11/15/2021 from his cardiologist office for evaluation of worsening shortness of breath from his baseline associated with orthopnea, increased abdominal girth, lower extremity swelling and weight gain of about 13 pounds. Admitted to the hospital for IV diuresis for acutely decompensated CHF.  Cardiology consulted.

## 2021-11-16 NOTE — Assessment & Plan Note (Addendum)
Appears to be on 70/30 insulin and glipizide at home. --Sliding scale NovoLog and CBGs. --Consider SGLT2 inhibitor given CHF. --Holding glipizide.

## 2021-11-16 NOTE — TOC Initial Note (Signed)
Transition of Care [facility]) - Initial/Assessment Note    Patient Details  Name: Henry Lindsey MRN: [id number] Date of Birth: [birth year]  Transition of Care [facility]) CM/SW Contact:    Shelbie Hutching, RN Phone Number: 11/16/2021, 3:32 PM  Clinical Narrative:                 Patient being admitted to the hospital for acute on chronic systolic and diastolic CHF.  RNCM met with patient at the bedside introduced self and explained role. Patient lives at home, he and his girlfriend both have their own houses but he stays with her most of the time.  Girlfriend is Henry Lindsey and they have been together about 9 years.  Patient is independent at home, drives, he does have a walker.  He is current with PCP Dr. Edwina Barth and follows with Darylene Price in the HF clinic.  Patient is on chronic oxygen at 2L through [employer].    TOC will follow for needs.   Looks like patient has been open with [facility] in the past, will reach out to [place] with [facility] to see if they might be able to follow him again for heart failure.   Expected Discharge Plan: [place] Barriers to Discharge: Continued Medical Work up   Patient Goals and CMS Choice Patient states their goals for this hospitalization and ongoing recovery are:: Patient wants to get rid of this fluid      Expected Discharge Plan and Services Expected Discharge Plan: [place]   Discharge Planning Services: CM Consult   Living arrangements for the past 2 months: Single Family Home                 DME Arranged: N/A DME Agency: NA                  Prior Living Arrangements/Services Living arrangements for the past 2 months: Single Family Home Lives with:: Significant Other Patient language and need for interpreter reviewed:: Yes Do you feel safe going back to the place where you live?: Yes      Need for Family Participation in Patient Care: Yes (Comment) (CHF) Care giver support system in place?: Yes (comment)  (girlfriend and daughter) Current home services: DME (RW, cane, bedside [employer]) Criminal Activity/Legal Involvement Pertinent to Current Situation/Hospitalization: No - Comment as needed  Activities of Daily Living      Permission Sought/Granted Permission sought to share information with : Case Manager, Family Supports Permission granted to share information with : Yes, Verbal Permission Granted  Share Information with NAME: Cathie Beams     Permission granted to share info w Relationship: daughter  Permission granted to share info w Contact Information: [phone]  Emotional Assessment Appearance:: Appears stated age Attitude/Demeanor/Rapport: Engaged Affect (typically observed): Accepting Orientation: : Oriented to Self, Oriented to Place, Oriented to  Time, Oriented to Situation Alcohol / Substance Use: Not Applicable Psych Involvement: No (comment)  Admission diagnosis:  Acute on chronic systolic CHF (congestive heart failure) (HCC) [I50.23] Patient Active Problem List   Diagnosis Date Noted   Infection of prosthetic right knee joint ([place]) 11/16/2021   Acute on chronic systolic CHF (congestive heart failure) ([place]) 11/15/2021   Hyponatremia 11/15/2021   S/P revision of total knee, right 10/01/2021   Non-ST elevation (NSTEMI) myocardial infarction ([place]) 08/20/2021   Acute on chronic systolic (congestive) heart failure ([place]) 08/15/2021   Cardiomyopathy ([place]) 08/14/2021   NSTEMI (non-ST elevated myocardial infarction) ([place])  Demand ischemia ([place])    Acute on chronic HFrEF (heart failure with reduced ejection fraction) ([place]) 08/09/2021   Type II diabetes mellitus with renal manifestations ([place]) 08/09/2021   COPD (chronic obstructive pulmonary disease) (HCC)    CKD (chronic kidney disease), stage IIIa    Acute respiratory failure with hypoxia (HCC)    Elevated troponin    Abnormal LFTs    Swelling of limb 03/27/2021   Ischemic cardiomyopathy 11/24/2020   Unstable  angina ([place]) 11/24/2020   Acute on chronic combined systolic and diastolic CHF (congestive heart failure) ([place]) 11/24/2020   Postoperative visit 08/22/2020   S/P TKR (total knee replacement) using cement, right 08/08/2020   Open wound of lower leg 05/29/2020   Plantar fasciitis 03/22/2020   Lumbar radiculopathy 07/05/2019   Chronic obstructive pulmonary disease ([place]) 06/11/2019   Gastroesophageal reflux disease without esophagitis 06/11/2019   Neuropathy 06/11/2019   Recurrent productive cough 03/27/2019   Chronic diarrhea    Benign neoplasm of transverse colon    Chronic iron deficiency anemia 10/09/2017   History of diverticulitis 10/05/2017   S/P laparoscopic cholecystectomy 12/05/2016   Bile duct leak    Chest pain 10/05/2016   Pain in joint, ankle and foot 02/25/2015   Diabetic neuropathy ([place]) 10/09/2014   BPH with obstruction/lower urinary tract symptoms 10/09/2014   Bursitis of hip, right 04/05/2014   Glaucoma 04/05/2014   BPH (benign prostatic hyperplasia) 04/05/2014   Peripheral autonomic neuropathy due to DM ([place]) 12/28/2013   Vitamin D deficiency 11/03/2013   Incomplete emptying of bladder 08/04/2013   Benign localized hyperplasia of prostate with urinary obstruction and other lower urinary tract symptoms (LUTS)(600.21) 06/24/2013   Nocturia 06/24/2013   Orchalgia 06/24/2013   Impotence due to erectile dysfunction 06/01/2013   Routine general medical examination at a health care facility 04/04/2013   CAD (coronary artery disease) 07/21/2012   Peripheral vascular disease in diabetes mellitus ([place])    Carotid artery stenosis with cerebral infarction ([place])    History of basal cell carcinoma excision 08/05/2011   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/05/2011   Uncontrolled type 2 diabetes mellitus with diabetic polyneuropathy, with long-term current use of insulin 08/05/2011   Stroke ([place]) 08/05/2011   Hyperlipidemia    Hypertension    History of CVA (cerebrovascular accident)    PCP:   Baxter Hire, MD Pharmacy:   [facility] [place], [address] [place] [address] Phone: [phone] Fax: [place], [place] [address] [place] [address] Phone: [phone] Fax: [phone]     Social Determinants of Health (SDOH) Interventions    Readmission Risk Interventions Readmission Risk Prevention Plan 11/16/2021 08/17/2021  Transportation Screening Complete Complete  PCP or Specialist Appt within 3-5 Days - Complete  HRI or [place] - Complete  Social Work Consult for [place] Planning/Counseling - Complete  Palliative Care Screening - Not Applicable  Medication Review Press photographer) Complete Complete  PCP or Specialist appointment within 3-5 days of discharge Complete -  [place] or Home Care Consult Complete -  SW Recovery Care/Counseling Consult Complete -  Palliative Care Screening Not Applicable -  [place] Not Applicable -  Some recent data might be hidden

## 2021-11-16 NOTE — Assessment & Plan Note (Addendum)
Patient is following with ID and on Keflex and rifampin for 6 more weeks. -- Resumed on rifampin (previously recommended but held off due to elevated LFTs which have normalized) --Continue Keflex --Monitor LFTs -- ID follow-up as scheduled

## 2021-11-16 NOTE — ED Notes (Signed)
Rn to bedside to introduce self to pt. Pt is alert and oriented and in no distress.

## 2021-11-17 ENCOUNTER — Inpatient Hospital Stay: Payer: Medicare Other

## 2021-11-17 DIAGNOSIS — I5043 Acute on chronic combined systolic (congestive) and diastolic (congestive) heart failure: Secondary | ICD-10-CM | POA: Diagnosis not present

## 2021-11-17 LAB — CBC
HCT: 32.2 % — ABNORMAL LOW (ref 39.0–52.0)
HCT: 35.9 % — ABNORMAL LOW (ref 39.0–52.0)
Hemoglobin: 9.5 g/dL — ABNORMAL LOW (ref 13.0–17.0)
Hemoglobin: 10.7 g/dL — ABNORMAL LOW (ref 13.0–17.0)
MCH: 23.6 pg — ABNORMAL LOW (ref 26.0–34.0)
MCH: 23.9 pg — ABNORMAL LOW (ref 26.0–34.0)
MCHC: 29.5 g/dL — ABNORMAL LOW (ref 30.0–36.0)
MCHC: 29.8 g/dL — ABNORMAL LOW (ref 30.0–36.0)
MCV: 80.1 fL (ref 80.0–100.0)
MCV: 80.1 fL (ref 80.0–100.0)
Platelets: 128 10*3/uL — ABNORMAL LOW (ref 150–400)
Platelets: 151 10*3/uL (ref 150–400)
RBC: 4.02 MIL/uL — ABNORMAL LOW (ref 4.22–5.81)
RBC: 4.48 MIL/uL (ref 4.22–5.81)
RDW: 18.4 % — ABNORMAL HIGH (ref 11.5–15.5)
RDW: 18.3 % — ABNORMAL HIGH (ref 11.5–15.5)
WBC: 4.9 10*3/uL (ref 4.0–10.5)
WBC: 5.5 10*3/uL (ref 4.0–10.5)
nRBC: 0 % (ref 0.0–0.2)
nRBC: 0 % (ref 0.0–0.2)

## 2021-11-17 LAB — COMPREHENSIVE METABOLIC PANEL
ALT: 8 U/L (ref 0–44)
ALT: 11 U/L (ref 0–44)
AST: 31 U/L (ref 15–41)
AST: 35 U/L (ref 15–41)
Albumin: 2.8 g/dL — ABNORMAL LOW (ref 3.5–5.0)
Albumin: 3.2 g/dL — ABNORMAL LOW (ref 3.5–5.0)
Alkaline Phosphatase: 87 U/L (ref 38–126)
Alkaline Phosphatase: 100 U/L (ref 38–126)
Anion gap: 8 (ref 5–15)
Anion gap: 11 (ref 5–15)
BUN: 46 mg/dL — ABNORMAL HIGH (ref 8–23)
BUN: 42 mg/dL — ABNORMAL HIGH (ref 8–23)
CO2: 30 mmol/L (ref 22–32)
CO2: 28 mmol/L (ref 22–32)
Calcium: 8.5 mg/dL — ABNORMAL LOW (ref 8.9–10.3)
Calcium: 9.1 mg/dL (ref 8.9–10.3)
Chloride: 90 mmol/L — ABNORMAL LOW (ref 98–111)
Chloride: 86 mmol/L — ABNORMAL LOW (ref 98–111)
Creatinine, Ser: 0.99 mg/dL (ref 0.61–1.24)
Creatinine, Ser: 0.94 mg/dL (ref 0.61–1.24)
GFR, Estimated: >60 mL/min (ref >60)
GFR, Estimated: >60 mL/min (ref >60)
Glucose, Bld: 120 mg/dL — ABNORMAL HIGH (ref 70–99)
Glucose, Bld: 162 mg/dL — ABNORMAL HIGH (ref 70–99)
Potassium: 4.3 mmol/L (ref 3.5–5.1)
Potassium: 4.5 mmol/L (ref 3.5–5.1)
Sodium: 128 mmol/L — ABNORMAL LOW (ref 135–145)
Sodium: 125 mmol/L — ABNORMAL LOW (ref 135–145)
Total Bilirubin: 2 mg/dL — ABNORMAL HIGH (ref 0.3–1.2)
Total Bilirubin: 2.3 mg/dL — ABNORMAL HIGH (ref 0.3–1.2)
Total Protein: 6.7 g/dL (ref 6.5–8.1)
Total Protein: 7.6 g/dL (ref 6.5–8.1)

## 2021-11-17 LAB — GLUCOSE, CAPILLARY
Glucose-Capillary: 110 mg/dL — ABNORMAL HIGH (ref 70–99)
Glucose-Capillary: 183 mg/dL — ABNORMAL HIGH (ref 70–99)
Glucose-Capillary: 112 mg/dL — ABNORMAL HIGH (ref 70–99)
Glucose-Capillary: 172 mg/dL — ABNORMAL HIGH (ref 70–99)

## 2021-11-17 LAB — MAGNESIUM: Magnesium: 1.9 mg/dL (ref 1.7–2.4)

## 2021-11-17 LAB — LACTIC ACID, PLASMA: Lactic Acid, Venous: 1.4 mmol/L (ref 0.5–1.9)

## 2021-11-17 LAB — PHOSPHORUS: Phosphorus: 3.1 mg/dL (ref 2.5–4.6)

## 2021-11-17 MED ORDER — GABAPENTIN 400 MG PO CAPS
400.0000 mg | ORAL_CAPSULE | Freq: Three times a day (TID) | ORAL | Status: DC
  Administered 2021-11-17 – 2021-11-21 (×12): 400 mg via ORAL
  Filled 2021-11-17 (×12): qty 1

## 2021-11-17 MED ORDER — MELATONIN 5 MG PO TABS
5.0000 mg | ORAL_TABLET | Freq: Every day | ORAL | Status: DC
  Administered 2021-11-17: 5 mg via ORAL
  Filled 2021-11-17: qty 1

## 2021-11-17 MED ORDER — TIZANIDINE HCL 4 MG PO TABS
4.0000 mg | ORAL_TABLET | Freq: Every evening | ORAL | Status: DC | PRN
  Filled 2021-11-17: qty 1

## 2021-11-17 MED ORDER — AMIODARONE HCL 200 MG PO TABS
200.0000 mg | ORAL_TABLET | Freq: Two times a day (BID) | ORAL | Status: DC
Start: 2021-11-17 — End: 2021-11-17

## 2021-11-17 MED ORDER — AMIODARONE HCL 200 MG PO TABS
200.0000 mg | ORAL_TABLET | Freq: Three times a day (TID) | ORAL | Status: DC
  Administered 2021-11-17 – 2021-11-21 (×11): 200 mg via ORAL
  Filled 2021-11-17 (×11): qty 1

## 2021-11-17 MED ORDER — FUROSEMIDE 10 MG/ML IJ SOLN
80.0000 mg | Freq: Two times a day (BID) | INTRAMUSCULAR | Status: DC
  Administered 2021-11-17: 40 mg via INTRAVENOUS
  Administered 2021-11-17: 80 mg via INTRAVENOUS
  Filled 2021-11-17 (×3): qty 8

## 2021-11-17 MED ORDER — ALPRAZOLAM 0.5 MG PO TABS
0.5000 mg | ORAL_TABLET | Freq: Once | ORAL | Status: AC
  Administered 2021-11-17: 0.5 mg via ORAL
  Filled 2021-11-17: qty 1

## 2021-11-17 NOTE — Progress Notes (Signed)
Progress Note   Patient: Henry Lindsey RXV:[id number] DOB: 1933/03/06 DOA: 11/15/2021     2 DOS: the patient was seen and examined on 11/17/2021   Brief hospital course: 88 y.o. male with medical history significant for coronary artery disease status post stent angioplasty, history of chronic systolic/diastolic CHF with EF 25 to 30%, history of COPD on 2 L of oxygen continuous, insulin-dependent diabetes mellitus, hypertension, GERD, CVA who was sent to the ED on 11/15/2021 from his cardiologist office for evaluation of worsening shortness of breath from his baseline associated with orthopnea, increased abdominal girth, lower extremity swelling and weight gain of about 13 pounds. Admitted to the hospital for IV diuresis for acutely decompensated CHF.  Cardiology consulted.  Plan discussed with cardiology today.   Assessment and Plan * Acute on chronic combined systolic and diastolic CHF (congestive heart failure) (Henry Lindsey)- (present on admission) Patient presented with worsening shortness of breath with exertion, orthopnea and bilateral lower extremity swelling and weight gain of approximately 20 pounds since October.    Echo 08/10/21 showed LVEF 25-30%, G2DD, WMA, moderately reduced RV function, RV systolic pressure 50.9TOIZ, mod to severe MR, mild to mod TR, mild aortic root dilation.  High PVC burden may be contributing. -- Cardiology consulted, appreciate recommendations -- IV Lasix increased 40>>80 mg IV BID -- Metoprolol on hold due to soft blood pressure --On low-dose spironolactone -- Appears no longer on losartan likely due to blood pressure --Not on SGLT2 inhibitor, consider --Previously did not tolerate Entresto due to hypotension -- Strict I/O's and daily weights -- Monitor renal function and electrolytes -- Consider thoracentesis if breathing does not improve further IV diuresis  Pleural effusion- (present on admission) Admission chest x-ray showed moderate sized right pleural  effusion significantly increased in size, mild increase in size of a small left pleural effusion.  Associated bibasilar atelectasis. Oxygen requirements are stable. -- Consider thoracentesis if breathing not improved with diuretics  Ischemic cardiomyopathy- (present on admission) Management as outlined above  Chronic respiratory failure with hypoxia (Henry Lindsey)- (present on admission) Baseline oxygen requirement of 2 L/min in the setting of COPD.  Oxygen requirement appears stable at this time.  Monitor.  COPD (chronic obstructive pulmonary disease) (Henry Lindsey)- (present on admission) Chronically on 2 L minute home oxygen.  No signs or symptoms of exacerbation at this time.  Monitor closely.  Continue bronchodilators as needed  Hyponatremia- (present on admission) Presented with sodium 124.  Likely hypervolemic in the setting of acutely decompensated CHF and volume overload.   1/28: Sodium 128, improving with diuresis as expected  Infection of prosthetic right knee joint (Henry Lindsey) Patient is following with ID and on Keflex and rifampin for 6 more weeks. -- Resumed on rifampin (previously recommended but held off due to elevated LFTs which have normalized) --Continue Keflex --Monitor LFTs -- ID follow-up as scheduled  CKD (chronic kidney disease), stage IIIa- (present on admission) Currently normal renal function with creatinine on admission 1.00.  Creatinine today stable 0.99. Monitor renal function with diuresis.  Type II diabetes mellitus with renal manifestations (Henry Lindsey)- (present on admission) Appears to be on 70/30 insulin and glipizide at home. --Sliding scale NovoLog and CBGs. --Consider SGLT2 inhibitor given CHF. --Hold glipizide.  Gastroesophageal reflux disease without esophagitis- (present on admission) Continue Protonix  BPH with obstruction/lower urinary tract symptoms- (present on admission) Continue Flomax  BPH (benign prostatic hyperplasia) Stable continue  Flomax     Subjective: Patient was sitting edge of bed when seen today.  He reports ongoing significant  dyspnea after exertion.  He had just sat back down after standing to void and was visibly short of breath with some accessory muscle use.  He reports significant orthopnea especially when he lays on his right side.  Asks if this might be related to the fluid in his chest.  Otherwise he denies acute complaints today including chest pain, palpitation, dizziness or lightheadedness, nausea vomiting or fevers chills.  Objective Vitals reviewed and notable for supplemental oxygen up slightly from 2 to 2.5 L/min.  No fevers.  Heart rate is controlled.  General exam: awake, alert, no acute distress, sitting edge of bed HEENT: moist mucus membranes, hearing grossly normal  Respiratory system: CTAB with diminished right side to about mid lung posteriorly, no expiratory wheezes, no rhonchi, increased respiratory effort with accessory muscle use, on 2.5 L/min [name]. Cardiovascular system: normal S1/S2, RRR, lower extremity edema and resolving as evidenced by skin wrinkling.   Central nervous system: A&O x3. no gross focal neurologic deficits, normal speech Extremities: Distal bilateral lower extremities are symmetrically erythematous but not Henry to touch, venous stasis changes, improving edema, normal tone Skin: dry, intact, normal temperature, erythematous distal lower extremities bilaterally symmetric Psychiatry: normal mood, congruent affect, judgement and insight appear normal   Data Reviewed:  Labs notable for improvement in sodium to 128, chloride 90, glucose 120, BUN 46, creatinine stable 0.99, albumin 2.8, total bili 2.0, hemoglobin 9.5, platelets 128  Family Communication: None present at bedside, will attempt to call  Disposition: Status is: Inpatient  Remains inpatient appropriate because: Remains on IV diuresis and volume overloaded with acute on chronic CHF         Time  spent: 35 minutes  Author: Ezekiel Slocumb, DO 11/17/2021 5:19 PM  For on call review [email or web address].

## 2021-11-17 NOTE — Progress Notes (Signed)
° °  Patient: Henry Lindsey  XIP:[id number] DOB: January 05, 1933 DOA: 11/15/2021      DOS: the patient was seen and examined on 11/17/2021  Subjective: nurse reports patient complaining of increased abdominal pressure and shortness of breath Patient states abdominal pressure/discomfort without focal pain. Last BM last night and was "very good".  Abdominal pressure today interfering with ease of breathing, appetite and mobility. KUB ordered earlier changed to stat and no acute findings found  Daughter at bedside and stated drainage of the fluid had been planned and ultrasound previously completed.(Results not found in Epic. Daughter stated frustration with "docs not being able to get coordinated to get the fluid drained"  Objective: Vitals:   11/17/21 1554 11/17/21 1921  BP: 114/70 (!) 145/90  Pulse: 79 (!) 108  Resp: 19 (!) 22  Temp: 98.3 F (36.8 C) (!) 97.5 F (36.4 C)  SpO2: 97% 97%     Assessment:  Neuro A & O x4, MAE no focal deficits PULM shallow breathing pattern, diminished, mild shortness of breath at rest. Not able to tolerate laying flat ABD - + fluid wave, bruise right mid lower quadrant, hyperactive bowel sounds , No tenderness CV ST, BP stable  Plan:   Biggest concern from both patient and daughter is his discomfort is preventing him from being able to rest. Discussed medication benefits/ risks. Also discussed shortness of breath and modalities that could be used if he should worsen. Patient agreed to try xanax to promote rest and will try CPAP/BIPAP should he need it  Anemia Almost 1 gm drop HGB in 24 hours despite diuresis Rpeat CBC Elevated Bili - repeat CMP, monitor liver enzymes - risk hepatic congestion ST - most likely related to discomfort. Will check mag tonight instead of AM given the aggressive diuresis Hypoalbuminemia - suggest use of albumin prior to lasix admin to improve diuretic response

## 2021-11-17 NOTE — Progress Notes (Signed)
Patient given 80mg  iv lasix at 1244 today.  Patient stated shortly after dose given that he did not feel right. Vitals were stable. Dr. Arbutus Ped made aware.  Patient felt better as the afternoon went on. Attempted to give patient evening dose of 80mg  iv lasix and he refused to take it stating that he did not want that high of dose because it made him feel weird.  Dr. Jasmine Pang made aware and told this RN that the patient could receive 40mg  iv and see how he felt.  Patient given the dose of 40mg .

## 2021-11-17 NOTE — Progress Notes (Addendum)
Patient Name: Henry Lindsey   Patient Profile:    Henry Lindsey is a 88 y.o. 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 Arbutus Ped. 2/22 LHC EF 35-45% 1/23 Echo  EF 30-35%   SUBJECTIVE: Still complaining of shortness of breath.  Also little bit cold.  Dr. Arbutus Ped reminds me that chest x-ray demonstrated a right pleural effusion in addition to heart failure.  Past Medical History:  Diagnosis Date   Anginal pain ([place])    Aortic atherosclerosis (HCC)    Arthritis    BPH (benign prostatic hyperplasia)    CAD (coronary artery disease)    a.) 2/22 Cath: LM 50d, LAD sev ost dz, mod-sev prox/mid dz. D1 sev dz. LCX mild prox dzs, OM2 mod-sev dz, RCA 100p. RPL and RPDA fill via L-R collats. EF 35%. Seen by CVTS->Med mgmt. b.) R/LHC 08/14/21: 30% m-d-LM, 85% oLAD, 50% pLAD, 35% dLAD, 80% D1, 50% pLCx, 70% OM1, 100% p-dRCA -> trans to [facility]. c.) 08/15/21 Impella supported HIGH RISK PCI with atherectomy. 2.75 x 26mm Onyx Frontier DES oLAD.   Carotid artery stenosis    a. 11/2010 U/S: <50% bilaterally; b. 03/2021 40-59% bilat ICA stenoses.   CHF (congestive heart failure) (HCC)    COPD (chronic obstructive pulmonary disease) ([place])    noted CT 10/05/16 former smoker quit age 57    CVA (cerebral infarction) 11/2010   a.) RIGHT thalamic lacunar   GERD (gastroesophageal reflux disease)    Heart murmur    HFrEF (heart failure with reduced ejection fraction) ([place])    a. 10/2020 Echo: EF 35%.   Hyperlipidemia    Hypertension    Ischemic cardiomyopathy    a. 10/2020 Echo: EF 35%. Nl RVSP. Mod MR. Mild TR; b. 11/2020 Cath:  CO/CI  5.05/2.49. LV gram: EF 35%.   Lower extremity cellulitis    NSTEMI (non-ST elevated myocardial infarction) ([place]) 08/09/2021   a.) R/LHC 08/14/2021: 30% m-d-LM, 85% oLAD, 50% pLAD, 35% dLAD, 80% D1, 50% pLCx, 70% OM1, 100% p-dRCA -> transfer to [facility]. b.) 08/15/2021 Impella supported HIGH RISK PCI with atherectomy. 2.75 x 42mm Onyx Frontier DES to [employer] [facility].   Peripheral vascular disease in diabetes mellitus ([place])    a. 06/2012 95% occlusion s/p PTA R SFA (Dr. Lucky Cowboy); b. 03/2021 ABIs: R 1.02, L 0.96.   Pneumonia    Type 2 diabetes mellitus treated with insulin (HCC)     Scheduled Meds:  Scheduled Meds:  ascorbic acid  500 mg Oral Daily   aspirin EC  81 mg Oral Daily   atorvastatin  40 mg Oral Daily   cephALEXin  500 mg Oral Q8H   cholecalciferol  1,000 Units Oral Daily   clopidogrel  75 mg Oral Daily   docusate sodium  100 mg Oral BID   enoxaparin (LOVENOX) injection  40 mg Subcutaneous Q24H   furosemide  40 mg Intravenous BID   gabapentin  400 mg Oral BID   insulin aspart  0-15 Units Subcutaneous TID WC   latanoprost  1 drop Both Eyes QHS   melatonin  5 mg Oral QHS   multivitamin with minerals  1 tablet Oral q AM   pantoprazole  40 mg Oral Daily   potassium chloride SA  20 mEq Oral BID   rifampin  300 mg Oral Q12H   spironolactone  12.5 mg Oral Daily   tamsulosin  0.4 mg Oral QPC breakfast   timolol  1 drop Both Eyes Daily   tiZANidine  4 mg Oral QHS   vitamin B-12  500 mcg Oral Daily   Continuous Infusions: acetaminophen **OR** acetaminophen, albuterol, cyclobenzaprine, HYDROcodone-acetaminophen, nitroGLYCERIN, ondansetron **OR** ondansetron (ZOFRAN) IV    PHYSICAL EXAM Vitals:   11/16/21 2352 11/17/21 0011 11/17/21 0430 11/17/21 0720  BP: (!) 95/58  118/77 116/71  Pulse: 65  78 77  Resp: 20   18  Temp: (!) 97.5 F (36.4 C)  (!) 97.5 F (36.4 C) 97.6 F (36.4 C)  TempSrc: Oral  Oral   SpO2: 97%  100% 98%  Weight:  87 kg 87 kg   Height:       Weight down 198>>191 Well  developed and nourished in no acute distress HENT normal Neck supple with JVP- jaw Decreased breath sounds Regular rate and rhythm, no murmurs or gallops Abd-soft with active BS No Clubbing cyanosis 1+edema Skin-warm and dry A & Oriented  Grossly normal sensory and motor function    TELEMETRY: Reviewed personnally pt in sinus with freq PVCs aobut 15-20%  at least two dominant morphologies:      Intake/Output Summary (Last 24 hours) at 11/17/2021 0933 Last data filed at 11/17/2021 0700 Gross per 24 hour  Intake 240 ml  Output 1175 ml  Net -935 ml    LABS: Basic Metabolic Panel: Recent Labs  Lab 11/12/21 1426 11/15/21 1304 11/15/21 1647 11/16/21 0756 11/17/21 0413  NA 128* 125* 124* 126* 128*  K 2.9* 3.4* 3.5 3.6 4.3  CL 88* 85* 85* 87* 90*  CO2 30 29 28 28 30   GLUCOSE 172* 71 138* 105* 120*  BUN 54* 56* 54* 51* 46*  CREATININE 1.03 1.00 0.94 0.95 0.99  CALCIUM 8.6* 8.8* 9.1 8.5* 8.5*  MG  --   --  2.0  --   --   PHOS  --   --   --   --  3.1   Cardiac Enzymes: No results for input(s): CKTOTAL, CKMB, CKMBINDEX, TROPONINI in the last 72 hours. CBC: Recent Labs  Lab 11/15/21 1647 11/16/21 0756 11/17/21 0413  WBC 5.9 4.7 4.9  HGB 10.8* 10.3* 9.5*  HCT 37.2* 34.8* 32.2*  MCV 81.6 80.0 80.1  PLT 163 147* 128*   PROTIME: No results for input(s): LABPROT, INR in the last 72 hours. Liver Function Tests: Recent Labs    11/16/21 0756 11/17/21 0413  AST 34 31  ALT 11 8  ALKPHOS 95 87  BILITOT 1.8* 2.0*  PROT 7.0 6.7  ALBUMIN 2.9* 2.8*   No results for input(s): LIPASE, AMYLASE in the last 72 hours. BNP: BNP (last 3 results) Recent Labs    10/19/21 1226 11/15/21 1304 11/15/21 1647  BNP 2,372.6* 2,665.8* 2,065.7*    ProBNP (last 3 results) No results for input(s): PROBNP in the last 8760 hours.      ASSESSMENT AND PLAN: A/C CHF systolic  CAD prior atherectomy 10/22  CVA  PVCs PACs  Anemia FE def w Ferritin 18 and Sat 6%  Prosthetic  knee infection 12/22 on 6w + ? Longer abx ( cephalosporin + rifampin)    Remains volume overloaded.  We will increase his furosemide from 40--80 mg twice  daily, following his creatinine.  His BUN is elevated.  Discussed with Dr. Arbutus Ped about thoracentesis.  Will defer to her her expertise but I think with his anemia perhaps we should see how he does with IV diuresis first.  Denies chest pain.  He has complex and frequent ventricular ectopy noted on many of the ECG since 2018.  It is possible that this is contributing to his congestive heart failure by way of impacting his left ventricular function as well as functional cardiac output.  I will take the liberty of starting him on low-dose amiodarone.  Surveillance monitoring in 4 to 6 weeks can be undertaken to determine whether it is appropriate to continue.  He remains on low-dose Aldactone, his Delene Loll and his beta-blockers are on hold because of low blood pressure.   Signed, Virl Axe MD  11/17/2021

## 2021-11-17 NOTE — Progress Notes (Signed)
Patient stated that he still does not feel right. Vital signs taken. Dr. Arbutus Ped and Sharion Settler, NP made aware. Orders placed.

## 2021-11-18 ENCOUNTER — Inpatient Hospital Stay: Payer: Medicare Other

## 2021-11-18 DIAGNOSIS — J9611 Chronic respiratory failure with hypoxia: Secondary | ICD-10-CM

## 2021-11-18 DIAGNOSIS — N138 Other obstructive and reflux uropathy: Secondary | ICD-10-CM

## 2021-11-18 DIAGNOSIS — I5043 Acute on chronic combined systolic (congestive) and diastolic (congestive) heart failure: Secondary | ICD-10-CM | POA: Diagnosis not present

## 2021-11-18 DIAGNOSIS — J90 Pleural effusion, not elsewhere classified: Secondary | ICD-10-CM

## 2021-11-18 DIAGNOSIS — N183 Chronic kidney disease, stage 3 unspecified: Secondary | ICD-10-CM

## 2021-11-18 DIAGNOSIS — E871 Hypo-osmolality and hyponatremia: Secondary | ICD-10-CM

## 2021-11-18 DIAGNOSIS — N401 Enlarged prostate with lower urinary tract symptoms: Secondary | ICD-10-CM

## 2021-11-18 LAB — PROTEIN, PLEURAL OR PERITONEAL FLUID: Total protein, fluid: <3 g/dL

## 2021-11-18 LAB — GLUCOSE, CAPILLARY
Glucose-Capillary: 117 mg/dL — ABNORMAL HIGH (ref 70–99)
Glucose-Capillary: 160 mg/dL — ABNORMAL HIGH (ref 70–99)
Glucose-Capillary: 173 mg/dL — ABNORMAL HIGH (ref 70–99)
Glucose-Capillary: 115 mg/dL — ABNORMAL HIGH (ref 70–99)

## 2021-11-18 LAB — IRON AND TIBC
Iron: 33 ug/dL — ABNORMAL LOW (ref 45–182)
Saturation Ratios: 8 % — ABNORMAL LOW (ref 17.9–39.5)
TIBC: 441 ug/dL (ref 250–450)
UIBC: 408 ug/dL

## 2021-11-18 LAB — COMPREHENSIVE METABOLIC PANEL
ALT: 9 U/L (ref 0–44)
AST: 32 U/L (ref 15–41)
Albumin: 3.1 g/dL — ABNORMAL LOW (ref 3.5–5.0)
Alkaline Phosphatase: 102 U/L (ref 38–126)
Anion gap: 10 (ref 5–15)
BUN: 37 mg/dL — ABNORMAL HIGH (ref 8–23)
CO2: 29 mmol/L (ref 22–32)
Calcium: 8.8 mg/dL — ABNORMAL LOW (ref 8.9–10.3)
Chloride: 90 mmol/L — ABNORMAL LOW (ref 98–111)
Creatinine, Ser: 0.9 mg/dL (ref 0.61–1.24)
GFR, Estimated: >60 mL/min (ref >60)
Glucose, Bld: 116 mg/dL — ABNORMAL HIGH (ref 70–99)
Potassium: 4 mmol/L (ref 3.5–5.1)
Sodium: 129 mmol/L — ABNORMAL LOW (ref 135–145)
Total Bilirubin: 1.9 mg/dL — ABNORMAL HIGH (ref 0.3–1.2)
Total Protein: 7.4 g/dL (ref 6.5–8.1)

## 2021-11-18 LAB — BODY FLUID CELL COUNT WITH DIFFERENTIAL
Lymphs, Fluid: 36 %
Monocyte-Macrophage-Serous Fluid: 61 %
Neutrophil Count, Fluid: 3 %
Total Nucleated Cell Count, Fluid: 2041 cu mm

## 2021-11-18 LAB — CBC
HCT: 33.1 % — ABNORMAL LOW (ref 39.0–52.0)
Hemoglobin: 9.8 g/dL — ABNORMAL LOW (ref 13.0–17.0)
MCH: 23.1 pg — ABNORMAL LOW (ref 26.0–34.0)
MCHC: 29.6 g/dL — ABNORMAL LOW (ref 30.0–36.0)
MCV: 78.1 fL — ABNORMAL LOW (ref 80.0–100.0)
Platelets: 127 10*3/uL — ABNORMAL LOW (ref 150–400)
RBC: 4.24 MIL/uL (ref 4.22–5.81)
RDW: 18.5 % — ABNORMAL HIGH (ref 11.5–15.5)
WBC: 4.5 10*3/uL (ref 4.0–10.5)
nRBC: 0 % (ref 0.0–0.2)

## 2021-11-18 LAB — ALBUMIN, PLEURAL OR PERITONEAL FLUID: Albumin, Fluid: 1.5 g/dL

## 2021-11-18 LAB — GLUCOSE, PLEURAL OR PERITONEAL FLUID: Glucose, Fluid: 133 mg/dL

## 2021-11-18 MED ORDER — FUROSEMIDE 40 MG PO TABS
40.0000 mg | ORAL_TABLET | Freq: Two times a day (BID) | ORAL | Status: DC
  Administered 2021-11-19 – 2021-11-21 (×5): 40 mg via ORAL
  Filled 2021-11-18 (×5): qty 1

## 2021-11-18 MED ORDER — ALPRAZOLAM 0.5 MG PO TABS
1.0000 mg | ORAL_TABLET | Freq: Once | ORAL | Status: AC
  Administered 2021-11-18: 1 mg via ORAL
  Filled 2021-11-18: qty 2

## 2021-11-18 MED ORDER — MELATONIN 5 MG PO TABS: 10.0000 mg | ORAL_TABLET | Freq: Every day | ORAL | Status: DC

## 2021-11-18 MED ORDER — ALPRAZOLAM 0.5 MG PO TABS
1.0000 mg | ORAL_TABLET | Freq: Every evening | ORAL | Status: DC | PRN
  Administered 2021-11-18 – 2021-11-19 (×2): 1 mg via ORAL
  Filled 2021-11-18 (×2): qty 2

## 2021-11-18 NOTE — Procedures (Signed)
Procedure: right side thoracentesis with U/S guidance Indications: right pleural effusion Description.  After verbal and current written consent was obtained for the patient, patient placed in the upright sitting position.  Patient's posterior thorax was imaged with ultrasound.  Suitable position for needle entry was marked with a magic marker.  The area overlying this mark was cleansed with 2% chlorhexidine.  Sterile barrier was placed over this cleansed area.  10 cc 1% lidocaine were used anesthetize the area.  Skin was nicked with a #11 scalpel blade.  Using an Arrow thoracentesis needle kit, the right pleural space was punctured on the first attempt.  Amber-colored pleural fluid was aspirated.  Catheter was advanced over the needle into the pleural space and the needle was removed in its entirety.  Fluid samples for analysis were taken.  Subsequently a 60 mL syringe was connected to the catheter and 1700 cc of dark amber-colored fluid was and aspirated.  Patient tolerated the procedure well without complication or difficulty.  Catheter was moved its entirety.  Puncture site was covered with a sterile Band-Aid.  Patient tolerated procedure well without complication or difficulty.  Total fluid removed: Estimated 1700 mL.

## 2021-11-18 NOTE — Assessment & Plan Note (Signed)
Stable

## 2021-11-18 NOTE — Progress Notes (Signed)
PROGRESS NOTE    Henry Lindsey  TFT:[id number] DOB: 15-Feb-1933 DOA: 11/15/2021 PCP: Baxter Hire, MD  Brief Narrative:   Henry Lindsey is a 88 y.o. male with medical history significant for coronary artery disease status post stent angioplasty, history of chronic combined systolic and diastolic dysfunction CHF with last known LVEF of 25 to 30% with severely decreased LV systolic function and grade 2 diastolic dysfunction.2D echocardiogram also shows moderately reduced RV systolic function with elevated right ventricular systolic pressure of 70.6CBJS, history of COPD with chronic respiratory failure on 2 L of oxygen continuous, insulin-dependent diabetes mellitus, hypertension, GERD, CVA who was sent to the emergency room from his cardiologist office for evaluation of worsening shortness of breath from his baseline associated with orthopnea, increased abdominal girth, lower extremity swelling and increased weight gain.  Patient states that his baseline weight is about 185 pounds and he was 198 pounds at the cardiologist office. He admits to being compliant with his medications and denies any dietary discretion. He has a cough productive of clear phlegm but denies having any fever, no chills, no changes in his bowel habits, no urinary frequency, no nocturia, no dysuria, no headache, no blurred vision, no focal deficit.  Principal Problem:   Acute on chronic combined systolic and diastolic CHF (congestive heart failure) (HCC) Active Problems:   Pleural effusion on right   Hyponatremia   BPH with obstruction/lower urinary tract symptoms   Gastroesophageal reflux disease without esophagitis   Ischemic cardiomyopathy   Type II diabetes mellitus with renal manifestations (HCC)   COPD (chronic obstructive pulmonary disease) (HCC)   CKD (chronic kidney disease), stage IIIa   Infection of prosthetic right knee joint (HCC)   Pulmonary hypertension (HCC)   Chronic respiratory failure with hypoxia  ([place])   Assessment & Plan:   Acute on chronic combined systolic and diastolic CHF (congestive heart failure) ([place]) Patient presented with worsening shortness of breath with exertion, orthopnea and bilateral lower extremity swelling and weight gain of approximately 20 pounds since October.    Echo 08/10/21 showed LVEF 25-30%, G2DD, WMA, moderately reduced RV function, RV systolic pressure 28.3TDVV, mod to severe MR, mild to mod TR, mild aortic root dilation.  High PVC burden may be contributing. -- Cardiology consulted, appreciate recommendations -- IV Lasix increased 40>>80 mg IV BID -- Metoprolol on hold due to soft blood pressure --On low-dose spironolactone -- Appears no longer on losartan likely due to blood pressure --Not on SGLT2 inhibitor, consider --Previously did not tolerate Entresto due to hypotension -- Strict I/O's and daily weights -- Monitor renal function and electrolytes -- Consider thoracentesis if breathing does not improve further IV diuresis  1/29: pt states lasix making him "feel funny". Refusing to take it. Discussed his large right pleural effusion. He is agreeable to bedside thoracentesis by me. Will proceed with bedside thoracentesis this afternoon.  BPH (benign prostatic hyperplasia) Stable continue Flomax  BPH with obstruction/lower urinary tract symptoms Continue Flomax  Chronic respiratory failure with hypoxia (HCC) Baseline oxygen requirement of 2 L/min in the setting of COPD.  Oxygen requirement appears stable at this time.  Monitor.  CKD (chronic kidney disease), stage IIIa Currently normal renal function with creatinine on admission 1.00.  Creatinine today stable 0.99. Monitor renal function with diuresis.  1/29: Scr stable.  COPD (chronic obstructive pulmonary disease) (HCC) Chronically on 2 L minute home oxygen.  No signs or symptoms of exacerbation at this time.  Monitor closely.  Continue bronchodilators as needed  Gastroesophageal  reflux disease without esophagitis Continue Protonix  Hyponatremia Presented with sodium 124.  Likely hypervolemic in the setting of acutely decompensated CHF and volume overload.   1/28: Sodium 128, improving with diuresis as expected  1/29: sodium up to 129.   Infection of prosthetic right knee joint ([place]) Patient is following with ID and on Keflex and rifampin for 6 more weeks. -- Resumed on rifampin (previously recommended but held off due to elevated LFTs which have normalized) --Continue Keflex --Monitor LFTs -- ID follow-up as scheduled  Ischemic cardiomyopathy Management as outlined above  Pleural effusion on right Admission chest x-ray showed moderate sized right pleural effusion significantly increased in size, mild increase in size of a small left pleural effusion.  Associated bibasilar atelectasis. Oxygen requirements are stable. -- Consider thoracentesis if breathing not improved with diuretics  1/29: pt agreeable to bedside thoracentesis by me this afternoon. Bedside thoracic u/s shows about [phone] of pleural effusion amenable to bedside thora.  Type II diabetes mellitus with renal manifestations (HCC) Appears to be on 70/30 insulin and glipizide at home. --Sliding scale NovoLog and CBGs. --Consider SGLT2 inhibitor given CHF. --Holding glipizide.  Pulmonary hypertension (HCC) Stable.   DVT prophylaxis: Lovenox   Code Status: DNR Family Communication: no family at bedside Disposition Plan: return home.  Consultants:  cardiology  Procedures:  Will perform bedside thoracentesis later today  Antimicrobials:  Po keflex(chronic)    Subjective: Pt refusing lasix this AM. States he makes him "feel funny". Bedside thoracic U/S performed. Large right pleural effusion. Amenable to bedside thoracentesis. Pt consented.   Objective: Vitals:   11/18/21 0152 11/18/21 0157 11/18/21 0605 11/18/21 0742  BP: 128/80  124/89 (!) 117/94  Pulse: 92  85 88  Resp: 20   20 18   Temp: 97.6 F (36.4 C)  97.7 F (36.5 C) 97.6 F (36.4 C)  TempSrc:      SpO2: 94%  99% 96%  Weight:  85.9 kg    Height:        Intake/Output Summary (Last 24 hours) at 11/18/2021 0935 Last data filed at 11/18/2021 0500 Gross per 24 hour  Intake 720 ml  Output 1775 ml  Net -1055 ml   Filed Weights   11/17/21 0011 11/17/21 0430 11/18/21 0157  Weight: 87 kg 87 kg 85.9 kg    Examination:  Physical Exam Vitals and nursing note reviewed.  Constitutional:      General: He is not in acute distress.    Appearance: Normal appearance. He is not ill-appearing, toxic-appearing or diaphoretic.  HENT:     Head: Normocephalic and atraumatic.     Nose: Nose normal.  Cardiovascular:     Rate and Rhythm: Normal rate and regular rhythm.  Pulmonary:     Effort: No respiratory distress.     Comments: Large right pleural effusion seen on bedside thoracic u/s. Abdominal:     General: Abdomen is flat. Bowel sounds are normal. There is no distension.     Palpations: Abdomen is soft.     Tenderness: There is no abdominal tenderness. There is no guarding.  Musculoskeletal:     Right lower leg: No edema.     Left lower leg: No edema.  Skin:    General: Skin is warm and dry.     Capillary Refill: Capillary refill takes less than 2 seconds.  Neurological:     Mental Status: He is alert and oriented to person, place, and time.    Data Reviewed: I have personally  reviewed following labs and imaging studies  CBC: Recent Labs  Lab 11/15/21 1647 11/16/21 0756 11/17/21 0413 11/17/21 2021 11/18/21 0529  WBC 5.9 4.7 4.9 5.5 4.5  HGB 10.8* 10.3* 9.5* 10.7* 9.8*  HCT 37.2* 34.8* 32.2* 35.9* 33.1*  MCV 81.6 80.0 80.1 80.1 78.1*  PLT 163 147* 128* 151 093*   Basic Metabolic Panel: Recent Labs  Lab 11/15/21 1647 11/16/21 0756 11/17/21 0413 11/17/21 2021 11/18/21 0529  NA 124* 126* 128* 125* 129*  K 3.5 3.6 4.3 4.5 4.0  CL 85* 87* 90* 86* 90*  CO2 28 28 30 28 29   GLUCOSE 138*  105* 120* 162* 116*  BUN 54* 51* 46* 42* 37*  CREATININE 0.94 0.95 0.99 0.94 0.90  CALCIUM 9.1 8.5* 8.5* 9.1 8.8*  MG 2.0  --   --  1.9  --   PHOS  --   --  3.1  --   --    GFR: Estimated Creatinine Clearance: 62.8 mL/min (by C-G formula based on SCr of 0.9 mg/dL). Liver Function Tests: Recent Labs  Lab 11/16/21 0756 11/17/21 0413 11/17/21 2021 11/18/21 0529  AST 34 31 35 32  ALT 11 8 11 9   ALKPHOS 95 87 100 102  BILITOT 1.8* 2.0* 2.3* 1.9*  PROT 7.0 6.7 7.6 7.4  ALBUMIN 2.9* 2.8* 3.2* 3.1*   No results for input(s): LIPASE, AMYLASE in the last 168 hours. No results for input(s): AMMONIA in the last 168 hours. Coagulation Profile: No results for input(s): INR, PROTIME in the last 168 hours. Cardiac Enzymes: No results for input(s): CKTOTAL, CKMB, CKMBINDEX, TROPONINI in the last 168 hours. BNP (last 3 results) No results for input(s): PROBNP in the last 8760 hours. HbA1C: No results for input(s): HGBA1C in the last 72 hours. CBG: Recent Labs  Lab 11/17/21 0722 11/17/21 1122 11/17/21 1629 11/17/21 2009 11/18/21 0741  GLUCAP 110* 183* 112* 172* 117*   Lipid Profile: No results for input(s): CHOL, HDL, LDLCALC, TRIG, CHOLHDL, LDLDIRECT in the last 72 hours. Thyroid Function Tests: No results for input(s): TSH, T4TOTAL, FREET4, T3FREE, THYROIDAB in the last 72 hours. Anemia Panel: No results for input(s): VITAMINB12, FOLATE, FERRITIN, TIBC, IRON, RETICCTPCT in the last 72 hours. Sepsis Labs: Recent Labs  Lab 11/17/21 2021  LATICACIDVEN 1.4    Recent Results (from the past 240 hour(s))  Resp Panel by RT-PCR (Flu A&B, Covid) Nasopharyngeal Swab     Status: None   Collection Time: 11/15/21  6:09 PM   Specimen: Nasopharyngeal Swab; Nasopharyngeal(NP) swabs in vial transport medium  Result Value Ref Range Status   SARS Coronavirus 2 by RT PCR NEGATIVE NEGATIVE Final    Comment: (NOTE) SARS-CoV-2 target nucleic acids are NOT DETECTED.  The SARS-CoV-2 RNA is  generally detectable in upper respiratory specimens during the acute phase of infection. The lowest concentration of SARS-CoV-2 viral copies this assay can detect is 138 copies/mL. A negative result does not preclude SARS-Cov-2 infection and should not be used as the sole basis for treatment or other patient management decisions. A negative result may occur with  improper specimen collection/handling, submission of specimen other than nasopharyngeal swab, presence of viral mutation(s) within the areas targeted by this assay, and inadequate number of viral copies(<138 copies/mL). A negative result must be combined with clinical observations, patient history, and epidemiological information. The expected result is Negative.  Fact Sheet for Patients:  EntrepreneurPulse.com.au  Fact Sheet for Healthcare Providers:  IncredibleEmployment.be  This test is no t yet approved or cleared  by the [place] and  has been authorized for detection and/or diagnosis of SARS-CoV-2 by FDA under an Emergency Use Authorization (EUA). This EUA will remain  in effect (meaning this test can be used) for the duration of the COVID-19 declaration under Section 564(b)(1) of the Act, 21 U.S.C.section 360bbb-3(b)(1), unless the authorization is terminated  or revoked sooner.       Influenza A by PCR NEGATIVE NEGATIVE Final   Influenza B by PCR NEGATIVE NEGATIVE Final    Comment: (NOTE) The Xpert Xpress SARS-CoV-2/FLU/RSV plus assay is intended as an aid in the diagnosis of influenza from Nasopharyngeal swab specimens and should not be used as a sole basis for treatment. Nasal washings and aspirates are unacceptable for Xpert Xpress SARS-CoV-2/FLU/RSV testing.  Fact Sheet for Patients: EntrepreneurPulse.com.au  Fact Sheet for Healthcare Providers: IncredibleEmployment.be  This test is not yet approved or cleared by the [place] FDA and has been authorized for detection and/or diagnosis of SARS-CoV-2 by FDA under an Emergency Use Authorization (EUA). This EUA will remain in effect (meaning this test can be used) for the duration of the COVID-19 declaration under Section 564(b)(1) of the Act, 21 U.S.C. section 360bbb-3(b)(1), unless the authorization is terminated or revoked.  Performed at [facility], [address], [place], [place] [address]      Radiology Studies: DG Abd Portable 1V  Result Date: 11/17/2021 CLINICAL DATA:  Pain/swelling EXAM: PORTABLE ABDOMEN - 1 VIEW COMPARISON:  None. FINDINGS: Nonobstructive bowel gas pattern. Surgical clips in the right mid abdomen. Degenerative changes of the lumbar spine. IMPRESSION: Negative. Electronically Signed   By: Julian Hy M.D.   On: 11/17/2021 20:08   ECHOCARDIOGRAM LIMITED  Result Date: 11/16/2021    ECHOCARDIOGRAM LIMITED REPORT   Patient Name:   HENRRY FEIL Date of Exam: 11/16/2021 Medical Rec #:  [id number]     Height:       71.0 in Accession #:    [id number]    Weight:       198.2 lb Date of Birth:  July 01, 1933     BSA:          2.101 m Patient Age:    44 years      BP:           129/55 mmHg Patient Gender: M             HR:           74 bpm. Exam Location:  [facility] Procedure: Limited Echo, Limited Color Doppler, Cardiac Doppler and Intracardiac            Opacification Agent Indications:     I50.21 congestive heart failure-Acute Systolic  History:         Patient has prior history of Echocardiogram examinations, most                  recent 08/10/2021. HFrEF and CHF, CAD, COPD, Stroke and PVD;                  Risk Factors:Diabetes.  Sonographer:     Charmayne Sheer Referring Phys:  [id number] [place] Diagnosing Phys: Ida Rogue MD  Sonographer Comments: Suboptimal subcostal window. IMPRESSIONS  1. Left ventricular ejection fraction, by estimation, is 30 to 35%. The left ventricle has moderately decreased function. The left ventricle  demonstrates global hypokinesis. The left ventricular internal cavity size was moderately dilated.  2. Right ventricular systolic function is moderately reduced. The right ventricular  size is moderately enlarged. Tricuspid regurgitation signal is inadequate for assessing PA pressure.  3. Left atrial size was moderately dilated.  4. The mitral valve is normal in structure. No evidence of mitral valve regurgitation. No evidence of mitral stenosis.  5. The aortic valve is normal in structure. Aortic valve regurgitation is not visualized. No aortic stenosis is present.  6. The inferior vena cava is dilated in size with <50% respiratory variability, suggesting right atrial pressure of 15 mmHg. FINDINGS  Left Ventricle: Left ventricular ejection fraction, by estimation, is 30 to 35%. The left ventricle has moderately decreased function. The left ventricle demonstrates global hypokinesis. Definity contrast agent was given IV to delineate the left ventricular endocardial borders. The left ventricular internal cavity size was moderately dilated. There is no left ventricular hypertrophy. Right Ventricle: The right ventricular size is moderately enlarged. No increase in right ventricular wall thickness. Right ventricular systolic function is moderately reduced. Tricuspid regurgitation signal is inadequate for assessing PA pressure. Left Atrium: Left atrial size was moderately dilated. Right Atrium: Right atrial size was normal in size. Pericardium: There is no evidence of pericardial effusion. Mitral Valve: The mitral valve is normal in structure. Mild mitral annular calcification. No evidence of mitral valve stenosis. Tricuspid Valve: The tricuspid valve is normal in structure. Tricuspid valve regurgitation is trivial. No evidence of tricuspid stenosis. Aortic Valve: The aortic valve is normal in structure. Aortic valve regurgitation is not visualized. No aortic stenosis is present. Pulmonic Valve: The pulmonic valve was  normal in structure. Pulmonic valve regurgitation is not visualized. No evidence of pulmonic stenosis. Aorta: The aortic root is normal in size and structure. Venous: The inferior vena cava is dilated in size with less than 50% respiratory variability, suggesting right atrial pressure of 15 mmHg. IAS/Shunts: No atrial level shunt detected by color flow Doppler. LEFT VENTRICLE PLAX 2D LVIDd:         5.70 cm LVIDs:         4.52 cm LV PW:         1.12 cm LV IVS:        0.81 cm  LV Volumes (MOD) LV vol d, MOD A2C: 248.0 ml LV vol d, MOD A4C: 211.0 ml LV vol s, MOD A2C: 151.0 ml LV vol s, MOD A4C: 127.0 ml LV SV MOD A2C:     97.0 ml LV SV MOD A4C:     211.0 ml LV SV MOD BP:      90.8 ml LEFT ATRIUM         Index LA diam:    5.30 cm 2.52 cm/m Ida Rogue MD Electronically signed by Ida Rogue MD Signature Date/Time: 11/16/2021/5:36:13 PM    Final      Scheduled Meds:  amiodarone  200 mg Oral Q8H   ascorbic acid  500 mg Oral Daily   aspirin EC  81 mg Oral Daily   atorvastatin  40 mg Oral Daily   cephALEXin  500 mg Oral Q8H   cholecalciferol  1,000 Units Oral Daily   clopidogrel  75 mg Oral Daily   docusate sodium  100 mg Oral BID   enoxaparin (LOVENOX) injection  40 mg Subcutaneous Q24H   furosemide  80 mg Intravenous BID   gabapentin  400 mg Oral TID   insulin aspart  0-15 Units Subcutaneous TID WC   latanoprost  1 drop Both Eyes QHS   melatonin  5 mg Oral QHS   multivitamin with minerals  1 tablet Oral q  AM   pantoprazole  40 mg Oral Daily   potassium chloride SA  20 mEq Oral BID   rifampin  300 mg Oral Q12H   spironolactone  12.5 mg Oral Daily   tamsulosin  0.4 mg Oral QPC breakfast   timolol  1 drop Both Eyes Daily   vitamin B-12  500 mcg Oral Daily   Continuous Infusions:   LOS: 3 days   Time spent: 35 mins  Kristopher Oppenheim, DO  [facility]  11/18/2021, 9:35 AM

## 2021-11-18 NOTE — TOC Progression Note (Signed)
Transition of Care [facility]) - Progression Note    Patient Details  Name: Henry Lindsey MRN: [id number] Date of Birth: [birth year]  [facility] CM/SW Contact  Eileen Stanford, LCSW Phone Number: 11/18/2021, 4:10 PM  Clinical Narrative:   Merleen Nicely with [facility] can accept pt.    Expected Discharge Plan: [place] Barriers to Discharge: Continued Medical Work up  Expected Discharge Plan and Services Expected Discharge Plan: [place]   Discharge Planning Services: CM Consult   Living arrangements for the past 2 months: Single Family Home                 DME Arranged: N/A DME Agency: NA                   Social Determinants of Health (SDOH) Interventions    Readmission Risk Interventions Readmission Risk Prevention Plan 11/16/2021 08/17/2021  Transportation Screening Complete Complete  PCP or Specialist Appt within 3-5 Days - Complete  HRI or [place] - Complete  Social Work Consult for [place] Planning/Counseling - Complete  Palliative Care Screening - Not Applicable  Medication Review Press photographer) Complete Complete  PCP or Specialist appointment within 3-5 days of discharge Complete -  [place] or Home Care Consult Complete -  SW Recovery Care/Counseling Consult Complete -  Palliative Care Screening Not Applicable -  [place] Not Applicable -  Some recent data might be hidden

## 2021-11-18 NOTE — Progress Notes (Signed)
Nurse called to room. Daughter and Pt is requesting something else to help for rest. Pt's vital signs within normal limits. Pt feeling more anxious. NP notified. Orders placed.

## 2021-11-18 NOTE — Progress Notes (Addendum)
Patient Name: Henry Lindsey   Patient Profile:    Henry Lindsey is a 88 y.o. 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 Arbutus Ped. 2/22 LHC EF 35-45% 1/23 Echo  EF 30-35%   SUBJECTIVE:  Pt declined uptitration of furosemide  Much more short of breath this morning.  Anxious for thoracentesis.  Reviewed the role of amiodarone given his high PVC burden he is agreeable.  Past Medical History:  Diagnosis Date   Anginal pain ([place])    Aortic atherosclerosis (HCC)    Arthritis    BPH (benign prostatic hyperplasia)    CAD (coronary artery disease)    a.) 2/22 Cath: LM 50d, LAD sev ost dz, mod-sev prox/mid dz. D1 sev dz. LCX mild prox dzs, OM2 mod-sev dz, RCA 100p. RPL and RPDA fill via L-R collats. EF 35%. Seen by CVTS->Med mgmt. b.) R/LHC 08/14/21: 30% m-d-LM, 85% oLAD, 50% pLAD, 35% dLAD, 80% D1, 50% pLCx, 70% OM1, 100% p-dRCA -> trans to [facility]. c.) 08/15/21 Impella supported HIGH RISK PCI with atherectomy. 2.75 x 51mm Onyx Frontier DES oLAD.   Carotid artery stenosis    a. 11/2010 U/S: <50% bilaterally; b. 03/2021 40-59% bilat ICA stenoses.   CHF (congestive heart failure) (HCC)    COPD (chronic obstructive pulmonary disease) ([place])    noted CT 10/05/16 former smoker quit age 88    CVA (cerebral infarction) 11/2010   a.) RIGHT thalamic lacunar   GERD (gastroesophageal reflux disease)    Heart murmur    HFrEF (heart failure with reduced ejection fraction) ([place])    a. 10/2020 Echo: EF 35%.   Hyperlipidemia    Hypertension    Ischemic cardiomyopathy    a. 10/2020 Echo: EF 35%. Nl RVSP. Mod MR. Mild TR; b. 11/2020 Cath:   CO/CI 5.05/2.49. LV gram: EF 35%.   Lower extremity cellulitis    NSTEMI (non-ST elevated myocardial infarction) ([place]) 08/09/2021   a.) R/LHC 08/14/2021: 30% m-d-LM, 85% oLAD, 50% pLAD, 35% dLAD, 80% D1, 50% pLCx, 70% OM1, 100% p-dRCA -> transfer to [facility]. b.) 08/15/2021 Impella supported HIGH RISK PCI with atherectomy. 2.75 x 12mm Onyx Frontier DES to [employer].   Peripheral vascular disease in diabetes mellitus ([place])    a. 06/2012 95% occlusion s/p PTA R SFA (Dr. Lucky Cowboy); b. 03/2021 ABIs: R 1.02, L 0.96.   Pneumonia    Type 2 diabetes mellitus treated with insulin (HCC)     Scheduled Meds:  Scheduled Meds:  amiodarone  200 mg Oral Q8H   ascorbic acid  500 mg Oral Daily   aspirin EC  81 mg Oral Daily   atorvastatin  40 mg Oral Daily   cephALEXin  500 mg Oral Q8H   cholecalciferol  1,000 Units Oral Daily   clopidogrel  75 mg Oral Daily   docusate sodium  100 mg Oral BID   enoxaparin (LOVENOX) injection  40 mg Subcutaneous Q24H   furosemide  80 mg Intravenous BID   gabapentin  400 mg Oral TID   insulin aspart  0-15 Units Subcutaneous TID WC   latanoprost  1 drop Both Eyes QHS  melatonin  5 mg Oral QHS   multivitamin with minerals  1 tablet Oral q AM   pantoprazole  40 mg Oral Daily   potassium chloride SA  20 mEq Oral BID   rifampin  300 mg Oral Q12H   spironolactone  12.5 mg Oral Daily   tamsulosin  0.4 mg Oral QPC breakfast   timolol  1 drop Both Eyes Daily   vitamin B-12  500 mcg Oral Daily   Continuous Infusions: acetaminophen **OR** acetaminophen, albuterol, cyclobenzaprine, HYDROcodone-acetaminophen, nitroGLYCERIN, ondansetron **OR** ondansetron (ZOFRAN) IV, tiZANidine    PHYSICAL EXAM Vitals:   11/18/21 0157 11/18/21 0605 11/18/21 0742 11/18/21 1144  BP:  124/89 (!) 117/94 111/65  Pulse:  85 88 82  Resp:  20 18 18   Temp:  97.7 F (36.5 C) 97.6 F (36.4 C) 97.8 F (36.6 C)  TempSrc:    Oral  SpO2:  99% 96% 98%  Weight: 85.9 kg     Height:      Well developed and  nourished in no acute distress HENT normal Neck supple with JVP-  10 Decreased breath sounds right lung Regular rate and rhythm, no murmurs or gallops Abd-soft with active BS No Clubbing cyanosis 1+edema Skin-warm and dry A & Oriented  Grossly normal sensory and motor function      TELEMETRY: Reviewed personnally patient in sinus rhythm with a PVC burden averaging about 20% ranging from 0 to 35%      Intake/Output Summary (Last 24 hours) at 11/18/2021 1200 Last data filed at 11/18/2021 1145 Gross per 24 hour  Intake 480 ml  Output 2300 ml  Net -1820 ml     LABS: Basic Metabolic Panel: Recent Labs  Lab 11/12/21 1426 11/15/21 1304 11/15/21 1647 11/16/21 0756 11/17/21 0413 11/17/21 2021 11/18/21 0529  NA 128* 125* 124* 126* 128* 125* 129*  K 2.9* 3.4* 3.5 3.6 4.3 4.5 4.0  CL 88* 85* 85* 87* 90* 86* 90*  CO2 30 29 28 28 30 28 29   GLUCOSE 172* 71 138* 105* 120* 162* 116*  BUN 54* 56* 54* 51* 46* 42* 37*  CREATININE 1.03 1.00 0.94 0.95 0.99 0.94 0.90  CALCIUM 8.6* 8.8* 9.1 8.5* 8.5* 9.1 8.8*  MG  --   --  2.0  --   --  1.9  --   PHOS  --   --   --   --  3.1  --   --     Cardiac Enzymes: No results for input(s): CKTOTAL, CKMB, CKMBINDEX, TROPONINI in the last 72 hours. CBC: Recent Labs  Lab 11/15/21 1647 11/16/21 0756 11/17/21 0413 11/17/21 2021 11/18/21 0529  WBC 5.9 4.7 4.9 5.5 4.5  HGB 10.8* 10.3* 9.5* 10.7* 9.8*  HCT 37.2* 34.8* 32.2* 35.9* 33.1*  MCV 81.6 80.0 80.1 80.1 78.1*  PLT 163 147* 128* 151 127*    PROTIME: No results for input(s): LABPROT, INR in the last 72 hours. Liver Function Tests: Recent Labs    11/17/21 2021 11/18/21 0529  AST 35 32  ALT 11 9  ALKPHOS 100 102  BILITOT 2.3* 1.9*  PROT 7.6 7.4  ALBUMIN 3.2* 3.1*    No results for input(s): LIPASE, AMYLASE in the last 72 hours. BNP: BNP (last 3 results) Recent Labs    10/19/21 1226 11/15/21 1304 11/15/21 1647  BNP 2,372.6* 2,665.8* 2,065.7*     ProBNP (last 3  results) No results for input(s): PROBNP in the last 8760 hours.      ASSESSMENT AND PLAN:  A/C CHF systolic  CAD prior atherectomy 10/22  CVA  PVCs PACs  Anemia FE def w Ferritin 18 and Sat 6%  Prosthetic knee infection 12/22 on 6w + ? Longer abx ( cephalosporin + rifampin)   Much more short of breath today.  Declined up titration of his furosemide; he said he has not tolerated in the past because of headaches etc.  Plan is for thoracentesis today that should help with breathing.  Amiodarone started, but refused last pm, took this am, it was my fault not to review with him the plan of initiating the amiodarone and our intentions as to its use.  He is agreeable.  The plan will be a 6-week trial with repeat serial monitoring   From my note 1/28>>He has complex and frequent ventricular ectopy noted on many of the ECG since 2018.  It is possible that this is contributing to his congestive heart failure by way of impacting his left ventricular function as well as functional cardiac output.  I will take the liberty of starting him on low-dose amiodarone.  Surveillance monitoring in 4 to 6 weeks can be undertaken to determine whether it is appropriate to continue.   BP better will resume losartan 25 daily   Consider IRON replacement therapy  >> ? IV while he is here   recent paper in [place] highlights the benefit of iron replacement therapy even in the setting of a reasonable hemoglobin with hyper ferritin anemia   Signed, Virl Axe MD  11/18/2021

## 2021-11-18 NOTE — Progress Notes (Signed)
F/u CXR negative for pneumothorax. Much improved right pleural effusion.  Has small left pleural effusion.

## 2021-11-18 NOTE — Subjective & Objective (Signed)
Henry Lindsey is a 88 y.o. male with medical history significant for coronary artery disease status post stent angioplasty, history of chronic combined systolic and diastolic dysfunction CHF with last known LVEF of 25 to 30% with severely decreased LV systolic function and grade 2 diastolic dysfunction.2D echocardiogram also shows moderately reduced RV systolic function with elevated right ventricular systolic pressure of 65.6CLEX, history of COPD with chronic respiratory failure on 2 L of oxygen continuous, insulin-dependent diabetes mellitus, hypertension, GERD, CVA who was sent to the emergency room from his cardiologist office for evaluation of worsening shortness of breath from his baseline associated with orthopnea, increased abdominal girth, lower extremity swelling and increased weight gain.  Patient states that his baseline weight is about 185 pounds and he was 198 pounds at the cardiologist office. He admits to being compliant with his medications and denies any dietary discretion. He has a cough productive of clear phlegm but denies having any fever, no chills, no changes in his bowel habits, no urinary frequency, no nocturia, no dysuria, no headache, no blurred vision, no focal deficit.

## 2021-11-19 ENCOUNTER — Ambulatory Visit: Payer: Medicare Other | Admitting: Cardiovascular Disease

## 2021-11-19 LAB — COMPREHENSIVE METABOLIC PANEL
ALT: 11 U/L (ref 0–44)
AST: 34 U/L (ref 15–41)
Albumin: 3.1 g/dL — ABNORMAL LOW (ref 3.5–5.0)
Alkaline Phosphatase: 108 U/L (ref 38–126)
Anion gap: 9 (ref 5–15)
BUN: 29 mg/dL — ABNORMAL HIGH (ref 8–23)
CO2: 30 mmol/L (ref 22–32)
Calcium: 9.1 mg/dL (ref 8.9–10.3)
Chloride: 91 mmol/L — ABNORMAL LOW (ref 98–111)
Creatinine, Ser: 0.88 mg/dL (ref 0.61–1.24)
GFR, Estimated: >60 mL/min (ref >60)
Glucose, Bld: 114 mg/dL — ABNORMAL HIGH (ref 70–99)
Potassium: 4.6 mmol/L (ref 3.5–5.1)
Sodium: 130 mmol/L — ABNORMAL LOW (ref 135–145)
Total Bilirubin: 2.9 mg/dL — ABNORMAL HIGH (ref 0.3–1.2)
Total Protein: 7.5 g/dL (ref 6.5–8.1)

## 2021-11-19 LAB — CBC WITH DIFFERENTIAL/PLATELET
Abs Immature Granulocytes: 0.03 10*3/uL (ref 0.00–0.07)
Basophils Absolute: 0 10*3/uL (ref 0.0–0.1)
Basophils Relative: 1 %
Eosinophils Absolute: 0.2 10*3/uL (ref 0.0–0.5)
Eosinophils Relative: 3 %
HCT: 34 % — ABNORMAL LOW (ref 39.0–52.0)
Hemoglobin: 9.9 g/dL — ABNORMAL LOW (ref 13.0–17.0)
Immature Granulocytes: 1 %
Lymphocytes Relative: 27 %
Lymphs Abs: 1.6 10*3/uL (ref 0.7–4.0)
MCH: 23.5 pg — ABNORMAL LOW (ref 26.0–34.0)
MCHC: 29.1 g/dL — ABNORMAL LOW (ref 30.0–36.0)
MCV: 80.8 fL (ref 80.0–100.0)
Monocytes Absolute: 0.6 10*3/uL (ref 0.1–1.0)
Monocytes Relative: 11 %
Neutro Abs: 3.5 10*3/uL (ref 1.7–7.7)
Neutrophils Relative %: 57 %
Platelets: 130 10*3/uL — ABNORMAL LOW (ref 150–400)
RBC: 4.21 MIL/uL — ABNORMAL LOW (ref 4.22–5.81)
RDW: 18.5 % — ABNORMAL HIGH (ref 11.5–15.5)
WBC: 6 10*3/uL (ref 4.0–10.5)
nRBC: 0 % (ref 0.0–0.2)

## 2021-11-19 LAB — PROTEIN, BODY FLUID (OTHER): Total Protein, Body Fluid Other: 3 g/dL

## 2021-11-19 LAB — MAGNESIUM: Magnesium: 2 mg/dL (ref 1.7–2.4)

## 2021-11-19 LAB — PATHOLOGIST SMEAR REVIEW

## 2021-11-19 LAB — GLUCOSE, CAPILLARY
Glucose-Capillary: 134 mg/dL — ABNORMAL HIGH (ref 70–99)
Glucose-Capillary: 149 mg/dL — ABNORMAL HIGH (ref 70–99)
Glucose-Capillary: 162 mg/dL — ABNORMAL HIGH (ref 70–99)
Glucose-Capillary: 152 mg/dL — ABNORMAL HIGH (ref 70–99)

## 2021-11-19 MED ORDER — BISACODYL 10 MG RE SUPP
10.0000 mg | Freq: Once | RECTAL | Status: AC
  Administered 2021-11-19: 10 mg via RECTAL
  Filled 2021-11-19: qty 1

## 2021-11-19 NOTE — Progress Notes (Addendum)
Progress Note   Patient: Henry Lindsey QMG:[id number] DOB: 12-04-1932 DOA: 11/15/2021     4 DOS: the patient was seen and examined on 11/19/2021   Brief hospital course: 88 y.o. male with medical history significant for coronary artery disease status post stent angioplasty, history of chronic systolic/diastolic CHF with EF 25 to 30%, history of COPD on 2 L of oxygen continuous, insulin-dependent diabetes mellitus, hypertension, GERD, CVA who was sent to the ED on 11/15/2021 from his cardiologist office for evaluation of worsening shortness of breath from his baseline associated with orthopnea, increased abdominal girth, lower extremity swelling and weight gain of about 13 pounds. Admitted to the hospital for IV diuresis for acutely decompensated CHF.  Cardiology consulted.   Assessment and Plan: * Acute on chronic combined systolic and diastolic CHF (congestive heart failure) ([place])- (present on admission) Patient presented with worsening shortness of breath with exertion, orthopnea and bilateral lower extremity swelling and weight gain of approximately 20 pounds since October.    Echo 08/10/21 showed LVEF 25-30%, G2DD, WMA, moderately reduced RV function, RV systolic pressure 32.6ZTIW, mod to severe MR, mild to mod TR, mild aortic root dilation.  High PVC burden may be contributing. -- Cardiology consulted, appreciate recommendations -- IV Lasix increased 40 IV BID -- Metoprolol on hold due to soft blood pressure --On low-dose spironolactone -- Appears no longer on losartan likely due to blood pressure --Not on SGLT2 inhibitor, consider --Previously did not tolerate Entresto due to hypotension -- Strict I/O's and daily weights -- Monitor renal function and electrolytes  1/30: Patient reports breathing improved after thoracentesis yesterday.  He does still have significant conversational dyspnea during our encounter  Pulmonary hypertension ([place])- (present on admission) Stable.  Pleural  effusion on right- (present on admission) Admission chest x-ray showed moderate sized right pleural effusion significantly increased in size, mild increase in size of a small left pleural effusion.  Associated bibasilar atelectasis. Oxygen requirements are stable. Status postthoracentesis at the bedside on 1/29 by Dr. Bridgett Larsson. -- Follow pleural fluid cultures  Ischemic cardiomyopathy- (present on admission) Management as outlined above. Cardiology following.  Continue aspirin Plavix.  Chronic respiratory failure with hypoxia ([place])- (present on admission) Baseline oxygen requirement of 2 L/min in the setting of COPD.  Oxygen requirement appears stable at this time.  Monitor.  COPD (chronic obstructive pulmonary disease) ([place])- (present on admission) Chronically on 2 L minute home oxygen.  No signs or symptoms of exacerbation at this time.  Monitor closely.  Continue bronchodilators as needed  Hyponatremia- (present on admission) Presented with sodium 124.  Likely hypervolemic in the setting of acutely decompensated CHF and volume overload.   Sodium levels improving with diuresis as expected  1/30: sodium up to 130.   Infection of prosthetic right knee joint ([place]) Patient is following with ID and on Keflex and rifampin for 6 more weeks. -- Resumed on rifampin (previously recommended but held off due to elevated LFTs which have normalized) --Continue Keflex --Monitor LFTs -- ID follow-up as scheduled  CKD (chronic kidney disease), stage IIIa- (present on admission) Currently normal renal function with creatinine on admission 1.00.   Creatinine today stable 0.88. Monitor renal function with diuresis.   Type II diabetes mellitus with renal manifestations ([place])- (present on admission) Appears to be on 70/30 insulin and glipizide at home. --Sliding scale NovoLog and CBGs. --Consider SGLT2 inhibitor given CHF. --Holding glipizide.  Gastroesophageal reflux disease without esophagitis-  (present on admission) Continue Protonix  BPH with obstruction/lower urinary tract symptoms- (  present on admission) Continue Flomax  BPH (benign prostatic hyperplasia) Stable continue Flomax      Subjective: Patient up in recliner when seen today.  Reports feeling better after the thoracentesis today.  He does continue to report stabbing right-sided back pain when he takes a deep breath.  Does not think it is at the site of the thoracentesis and it has not really changed.  He still short of breath while talking to me.  He is requesting suppository to help with having a bowel movement  Physical Exam: Vitals:   11/19/21 0831 11/19/21 0910 11/19/21 1117 11/19/21 1606  BP: (!) 121/56  107/68 106/65  Pulse: 98  87 85  Resp: 17  17 20   Temp: 98.1 F (36.7 C)  97.9 F (36.6 C) 97.6 F (36.4 C)  TempSrc:    Oral  SpO2: 93%  92% 92%  Weight:  84.8 kg    Height:       General exam: awake, alert, no acute distress HEENT: moist mucus membranes, hearing grossly normal  Respiratory system: CTAB with bibasilar crackles, improved aeration in the right base no wheezes or rhonchi, increased respiratory effort with conversational dyspnea. Cardiovascular system: normal S1/S2, RRR, trace lower extremity edema bilaterally.   Gastrointestinal system: soft, NT, ND, no HSM felt, +bowel sounds. Central nervous system: A&O x3. no gross focal neurologic deficits, normal speech Extremities: Distal bilateral lower extremities are symmetrically erythematous but without warmth on palpation bilaterally, lower extremity venous stasis, trace lower extremity edema, normal tone Skin: dry, intact, normal temperature Psychiatry: normal mood, congruent affect, judgement and insight appear normal   Data Reviewed:  Labs reviewed and notable for sodium up to 130, chloride 91, glucose 114, BUN 29.  Creatinine stable 0.88.  Albumin 3.1.  Hemoglobin 9.9.  Platelets 130  Family Communication: None at bedside.  Will  attempt to call   Disposition: Status is: Inpatient Remains inpatient appropriate because: Remains on IV diuresis for volume overload.  Discharge pending clearance by cardiology and further clinical improvement  Planned Discharge Destination: Home           Time spent: 35 minutes  Author: Ezekiel Slocumb, DO 11/19/2021 4:35 PM  For on call review [email or web address].

## 2021-11-19 NOTE — Progress Notes (Signed)
Progress Note  Patient Name: Henry Lindsey Date of Encounter: 11/19/2021  Primary Cardiologist: Rockey Situ  Subjective   He underwent right-sided thoracentesis on 1/29 with 1700 mL of dark amber color fluid removed. Path review negative for malignancy. Documented UOP 1 L for the past 24 hours with a net - 4.4 L for the admission. Weight 85.9-->84.8 kg. Notes some dizziness this morning. No chest pain or palpitations. Dyspnea and lower extremity improving.   Inpatient Medications    Scheduled Meds:  amiodarone  200 mg Oral Q8H   ascorbic acid  500 mg Oral Daily   aspirin EC  81 mg Oral Daily   atorvastatin  40 mg Oral Daily   cephALEXin  500 mg Oral Q8H   cholecalciferol  1,000 Units Oral Daily   clopidogrel  75 mg Oral Daily   docusate sodium  100 mg Oral BID   enoxaparin (LOVENOX) injection  40 mg Subcutaneous Q24H   furosemide  40 mg Oral BID   gabapentin  400 mg Oral TID   insulin aspart  0-15 Units Subcutaneous TID WC   latanoprost  1 drop Both Eyes QHS   multivitamin with minerals  1 tablet Oral q AM   pantoprazole  40 mg Oral Daily   potassium chloride SA  20 mEq Oral BID   rifampin  300 mg Oral Q12H   spironolactone  12.5 mg Oral Daily   tamsulosin  0.4 mg Oral QPC breakfast   timolol  1 drop Both Eyes Daily   vitamin B-12  500 mcg Oral Daily   Continuous Infusions:  PRN Meds: acetaminophen **OR** acetaminophen, albuterol, ALPRAZolam, cyclobenzaprine, HYDROcodone-acetaminophen, nitroGLYCERIN, ondansetron **OR** ondansetron (ZOFRAN) IV, tiZANidine   Vital Signs    Vitals:   11/19/21 0038 11/19/21 0319 11/19/21 0831 11/19/21 0910  BP: 113/71 (!) 119/99 (!) 121/56   Pulse: 90 92 98   Resp: 15  17   Temp: (!) 97.4 F (36.3 C) 97.9 F (36.6 C) 98.1 F (36.7 C)   TempSrc: Oral Oral    SpO2: 93% 92% 93%   Weight:    84.8 kg  Height:        Intake/Output Summary (Last 24 hours) at 11/19/2021 0911 Last data filed at 11/19/2021 0829 Gross per 24 hour  Intake  240 ml  Output 1375 ml  Net -1135 ml   Filed Weights   11/17/21 0430 11/18/21 0157 11/19/21 0910  Weight: 87 kg 85.9 kg 84.8 kg    Telemetry    SR with occasional PVCs - Personally Reviewed  ECG    No new tracings - Personally Reviewed  Physical Exam   GEN: No acute distress.   Neck: JVD ~ 8 cm. Cardiac: RRR, II/VI systolic murmur at the apex, no rubs, or gallops.  Respiratory: Faint crackles along the bases bilaterally.  GI: Soft, nontender, non-distended.   MS: Improved trivial lower extremity edema; No deformity. Neuro:  Alert and oriented x 3; Nonfocal.  Psych: Normal affect.  Labs    Chemistry Recent Labs  Lab 11/17/21 2021 11/18/21 0529 11/19/21 0446  NA 125* 129* 130*  K 4.5 4.0 4.6  CL 86* 90* 91*  CO2 28 29 30   GLUCOSE 162* 116* 114*  BUN 42* 37* 29*  CREATININE 0.94 0.90 0.88  CALCIUM 9.1 8.8* 9.1  PROT 7.6 7.4 7.5  ALBUMIN 3.2* 3.1* 3.1*  AST 35 32 34  ALT 11 9 11   ALKPHOS 100 102 108  BILITOT 2.3* 1.9* 2.9*  GFRNONAA >  60 >60 >60  ANIONGAP 11 10 9      Hematology Recent Labs  Lab 11/17/21 2021 11/18/21 0529 11/19/21 0446  WBC 5.5 4.5 6.0  RBC 4.48 4.24 4.21*  HGB 10.7* 9.8* 9.9*  HCT 35.9* 33.1* 34.0*  MCV 80.1 78.1* 80.8  MCH 23.9* 23.1* 23.5*  MCHC 29.8* 29.6* 29.1*  RDW 18.3* 18.5* 18.5*  PLT 151 127* 130*    Cardiac EnzymesNo results for input(s): TROPONINI in the last 168 hours. No results for input(s): TROPIPOC in the last 168 hours.   BNP Recent Labs  Lab 11/15/21 1304 11/15/21 1647  BNP 2,665.8* 2,065.7*     DDimer No results for input(s): DDIMER in the last 168 hours.   Radiology    DG Chest Port 1 View  Result Date: 11/18/2021 IMPRESSION: Stable chest radiograph. Electronically Signed   By: Valentino Saxon M.D.   On: 11/18/2021 14:58   DG Abd Portable 1V  Result Date: 11/17/2021 IMPRESSION: Negative. Electronically Signed   By: Julian Hy M.D.   On: 11/17/2021 20:08    Cardiac Studies    Limited echo 11/16/2021: 1. Left ventricular ejection fraction, by estimation, is 30 to 35%. The  left ventricle has moderately decreased function. The left ventricle  demonstrates global hypokinesis. The left ventricular internal cavity size  was moderately dilated.   2. Right ventricular systolic function is moderately reduced. The right  ventricular size is moderately enlarged. Tricuspid regurgitation signal is  inadequate for assessing PA pressure.   3. Left atrial size was moderately dilated.   4. The mitral valve is normal in structure. No evidence of mitral valve  regurgitation. No evidence of mitral stenosis.   5. The aortic valve is normal in structure. Aortic valve regurgitation is  not visualized. No aortic stenosis is present.   6. The inferior vena cava is dilated in size with <50% respiratory  variability, suggesting right atrial pressure of 15 mmHg. __________  Coronary arthrectomy 08/15/2021:   Mid LM to Dist LM lesion is 30% stenosed.   Ost LAD lesion is 85% stenosed.   Prox LAD lesion is 50% stenosed.   Dist LAD lesion is 35% stenosed.   Prox Cx lesion is 50% stenosed.   Prox RCA to Dist RCA lesion is 100% stenosed.   1st Diag lesion is 80% stenosed.   1st Mrg lesion is 70% stenosed.   A drug-eluting stent was successfully placed using a STENT ONYX FRONTIER 2.75X26.   Post intervention, there is a 0% residual stenosis.   Post intervention, there is a 0% residual stenosis.   Post intervention, there is a 0% residual stenosis.   Successful Impella supported high risk PCI of the ostial LAD with atherectomy and drug-eluting stent placement.  The stent extended 1 to 2 mm into the left main coronary artery to ensure coverage of the ostium.  There was normal flow into the  left circumflex. Moderately elevated left ventricular end-diastolic pressure at 24 mmHg at the beginning of the case.   Recommendations: Dual antiplatelet therapy for at least 12 months and  preferably longer. Aggressive treatment of risk factors. Optimize heart failure therapy.  Monitor renal function closely.  135 mL of contrast was used. __________   [facility] 08/14/2021: Conclusions: Severe multivessel coronary artery disease, including 80-90% ostial LAD stenosis and chronic total occlusion of the proximal RCA, a seen on prior catheterization in 11/2020. Moderately elevated left heart, right heart, and pulmonary artery pressures. Moderately-severely reduced cardiac output/index. Ectatic abdominal aorta common/external iliac  arteries, and common femoral arteries without significant disease.  Focal 70% right internal iliac artery stenosis is noted. 50% ostial stenosis of right renal artery.   Recommendations: Transfer to [facility] for consideration of high risk PCI to LMCA/ostial LAD.  Given heavy calcification and reduced LVEF, atherectomy and Impella support will need to be considered.  I will load the patient with clopidogrel 600 mg today, followed by 75 mg daily thereafter. Maintain net even to slightly negative fluid balance following contrast exposure today and chronic kidney disease. Escalate goal-directed medical therapy of acute on chronic HFrEF, as tolerated. Aggressive secondary prevention of coronary artery disease. __________   2D echo 08/10/2021: 1. Left ventricular ejection fraction, by estimation, is 25 to 30%. The  left ventricle has severely decreased function. The left ventricle  demonstrates global hypokinesis with severe hypokinesis of the anterior,  septal, and apical region. Inferior wall  best preserved. Left ventricular diastolic parameters are consistent with  Grade II diastolic dysfunction (pseudonormalization). The average left  ventricular global longitudinal strain is -6.3 %. The global longitudinal  strain is abnormal.   2. Right ventricular systolic function is moderately reduced. The right  ventricular size is moderately enlarged. There is  mildly elevated  pulmonary artery systolic pressure. The estimated right ventricular  systolic pressure is 29.5 mmHg.   3. Left atrial size was moderately dilated.   4. The mitral valve is normal in structure. Moderate to severe mitral  valve regurgitation.   5. Tricuspid valve regurgitation is mild to moderate.   6. Aortic dilatation noted. There is mild dilatation of the aortic root,  measuring 39 mm.   7. The inferior vena cava is dilated in size with <50% respiratory  variability, suggesting right atrial pressure of 15 mmHg.  Patient Profile     88 y.o. male with history of CAD status post high risk Impella supported PCI/arthrectomy to the LAD in 07/2021, chronic combined systolic and diastolic CHF, ICM, CVA, DM, HTN, HLD, PAD, carotid artery disease, anemia, lower extremity cellulitis, and GERD who was admitted from our office on 1/26 with acute on chronic combined CHF and electrolyte derangements after failing outpatient diuresis.  Assessment & Plan    1. Acute on chronic combined systolic and diastolic CHF/ICM/pulomnary hypertension: -Volume status much improved -Status post right-sided thoracentesis 1/29 with 1.7 L of fluid removed with path review negative for malignancy  -Metoprolol and losartan have been held secondary to hypotension with BP room needed for IV diuresis -Renal function continues to improve with diuresis  -Continue IV Lasix 40 mg bid, he has refused dosage escalation previously secondary to headache -Defer resumption of losartan today given dizziness associated with morning medications currently  -Continue spironolactone  -Escalate GDMT as able  2. CAD involving the native coronary arteries without angina with mildly elevated high sensitivity troponin: -No chest pain -High sensitivity troponin mildly elevated and flat trending, not consistent with ACS; likely supply demand ischemia with known CAD in the context of volume overload, AKI, and  anemia -ASA -Plavix -No plans for inpatient ischemic   3. Frequent PVCs: -Improved -Possibly driving some of his cardiomyopathy  -Continue amiodarone per EP -Repeat Zio patch as an outpatient to quantify PVC burden  -Resume beta blocker possibly tomorrow  -Potassium and magnesium at goal  4. Anemia: -Stable  5. HLD: -LDL 29 in 07/2021 -Lipitor        For questions or updates, please contact [place] Please consult [email or web address] for contact info under Cardiology/STEMI.  Signed, Christell Faith, PA-C [place] Pager: [phone] 11/19/2021, 9:11 AM

## 2021-11-19 NOTE — Care Management Important Message (Signed)
Important Message  Patient Details  Name: Henry Lindsey MRN: [id number] Date of Birth: 29-[name]-1933   Medicare Important Message Given:  Yes     Dannette Barbara 11/19/2021, 11:39 AM

## 2021-11-20 LAB — CBC
HCT: 33.3 % — ABNORMAL LOW (ref 39.0–52.0)
Hemoglobin: 9.7 g/dL — ABNORMAL LOW (ref 13.0–17.0)
MCH: 23.2 pg — ABNORMAL LOW (ref 26.0–34.0)
MCHC: 29.1 g/dL — ABNORMAL LOW (ref 30.0–36.0)
MCV: 79.5 fL — ABNORMAL LOW (ref 80.0–100.0)
Platelets: 132 10*3/uL — ABNORMAL LOW (ref 150–400)
RBC: 4.19 MIL/uL — ABNORMAL LOW (ref 4.22–5.81)
RDW: 19 % — ABNORMAL HIGH (ref 11.5–15.5)
WBC: 6 10*3/uL (ref 4.0–10.5)
nRBC: 0 % (ref 0.0–0.2)

## 2021-11-20 LAB — BASIC METABOLIC PANEL
Anion gap: 12 (ref 5–15)
BUN: 28 mg/dL — ABNORMAL HIGH (ref 8–23)
CO2: 25 mmol/L (ref 22–32)
Calcium: 8.7 mg/dL — ABNORMAL LOW (ref 8.9–10.3)
Chloride: 92 mmol/L — ABNORMAL LOW (ref 98–111)
Creatinine, Ser: 0.86 mg/dL (ref 0.61–1.24)
GFR, Estimated: >60 mL/min (ref >60)
Glucose, Bld: 111 mg/dL — ABNORMAL HIGH (ref 70–99)
Potassium: 4.6 mmol/L (ref 3.5–5.1)
Sodium: 129 mmol/L — ABNORMAL LOW (ref 135–145)

## 2021-11-20 LAB — GLUCOSE, CAPILLARY
Glucose-Capillary: 102 mg/dL — ABNORMAL HIGH (ref 70–99)
Glucose-Capillary: 191 mg/dL — ABNORMAL HIGH (ref 70–99)
Glucose-Capillary: 121 mg/dL — ABNORMAL HIGH (ref 70–99)
Glucose-Capillary: 119 mg/dL — ABNORMAL HIGH (ref 70–99)

## 2021-11-20 MED ORDER — MELATONIN 5 MG PO TABS
5.0000 mg | ORAL_TABLET | Freq: Every day | ORAL | Status: DC
  Administered 2021-11-20: 5 mg via ORAL
  Filled 2021-11-20: qty 1

## 2021-11-20 MED ORDER — METOLAZONE 2.5 MG PO TABS
2.5000 mg | ORAL_TABLET | Freq: Once | ORAL | Status: AC
Start: 2021-11-20 — End: 2021-11-20
  Administered 2021-11-20: 2.5 mg via ORAL
  Filled 2021-11-20: qty 1

## 2021-11-20 NOTE — Progress Notes (Addendum)
Progress Note  Patient Name: Henry Lindsey Date of Encounter: 11/20/2021  Primary Cardiologist: Rockey Situ  Subjective   Documented UOP 230 mL for the past 24 hours with a net - 4.1 L for the admission. Weight 84.8-->84.2 kg over the past 24 hours. Renal function stable. BP stable. He reports a drop off in his UOP over the past 24 hours. Still notes orthopnea. No chest pain, palpitations, dizziness, presyncope, or syncope. Lower extremity swelling is improving. When sitting in the recliner, he does elevate his legs.   Inpatient Medications    Scheduled Meds:  amiodarone  200 mg Oral Q8H   ascorbic acid  500 mg Oral Daily   aspirin EC  81 mg Oral Daily   atorvastatin  40 mg Oral Daily   cephALEXin  500 mg Oral Q8H   cholecalciferol  1,000 Units Oral Daily   clopidogrel  75 mg Oral Daily   docusate sodium  100 mg Oral BID   enoxaparin (LOVENOX) injection  40 mg Subcutaneous Q24H   furosemide  40 mg Oral BID   gabapentin  400 mg Oral TID   insulin aspart  0-15 Units Subcutaneous TID WC   latanoprost  1 drop Both Eyes QHS   multivitamin with minerals  1 tablet Oral q AM   pantoprazole  40 mg Oral Daily   potassium chloride SA  20 mEq Oral BID   rifampin  300 mg Oral Q12H   spironolactone  12.5 mg Oral Daily   tamsulosin  0.4 mg Oral QPC breakfast   timolol  1 drop Both Eyes Daily   vitamin B-12  500 mcg Oral Daily   Continuous Infusions:  PRN Meds: acetaminophen **OR** acetaminophen, albuterol, ALPRAZolam, cyclobenzaprine, HYDROcodone-acetaminophen, nitroGLYCERIN, ondansetron **OR** ondansetron (ZOFRAN) IV, tiZANidine   Vital Signs    Vitals:   11/19/21 2352 11/20/21 0453 11/20/21 0500 11/20/21 0736  BP: 118/75 119/71  115/68  Pulse: 81 91  90  Resp: 16 16  18   Temp: 98.9 F (37.2 C) 98.4 F (36.9 C)  97.7 F (36.5 C)  TempSrc:    Oral  SpO2: 100% 96%  98%  Weight:   84.2 kg   Height:        Intake/Output Summary (Last 24 hours) at 11/20/2021 1128 Last data  filed at 11/20/2021 0820 Gross per 24 hour  Intake 720 ml  Output 700 ml  Net 20 ml   Filed Weights   11/18/21 0157 11/19/21 0910 11/20/21 0500  Weight: 85.9 kg 84.8 kg 84.2 kg    Telemetry    SR with improved PVC burden - Personally Reviewed  ECG    No new tracings - Personally Reviewed  Physical Exam   GEN: No acute distress.   Neck: Mild JVD. Cardiac: RRR, II/VI systolic murmur at the LSB, no rubs, or gallops.  Respiratory: Mildly diminished with faint crackles along the bases bilaterally.  GI: Soft, nontender, non-distended.   MS: Mild, improving lower extremity edema; No deformity. Neuro:  Alert and oriented x 3; Nonfocal.  Psych: Normal affect.  Labs    Chemistry Recent Labs  Lab 11/17/21 2021 11/18/21 0529 11/19/21 0446 11/20/21 0816  NA 125* 129* 130* 129*  K 4.5 4.0 4.6 4.6  CL 86* 90* 91* 92*  CO2 28 29 30 25   GLUCOSE 162* 116* 114* 111*  BUN 42* 37* 29* 28*  CREATININE 0.94 0.90 0.88 0.86  CALCIUM 9.1 8.8* 9.1 8.7*  PROT 7.6 7.4 7.5  --  ALBUMIN 3.2* 3.1* 3.1*  --   AST 35 32 34  --   ALT 11 9 11   --   ALKPHOS 100 102 108  --   BILITOT 2.3* 1.9* 2.9*  --   GFRNONAA >60 >60 >60 >60  ANIONGAP 11 10 9 12      Hematology Recent Labs  Lab 11/18/21 0529 11/19/21 0446 11/20/21 0816  WBC 4.5 6.0 6.0  RBC 4.24 4.21* 4.19*  HGB 9.8* 9.9* 9.7*  HCT 33.1* 34.0* 33.3*  MCV 78.1* 80.8 79.5*  MCH 23.1* 23.5* 23.2*  MCHC 29.6* 29.1* 29.1*  RDW 18.5* 18.5* 19.0*  PLT 127* 130* 132*    Cardiac EnzymesNo results for input(s): TROPONINI in the last 168 hours. No results for input(s): TROPIPOC in the last 168 hours.   BNP Recent Labs  Lab 11/15/21 1304 11/15/21 1647  BNP 2,665.8* 2,065.7*     DDimer No results for input(s): DDIMER in the last 168 hours.   Radiology    DG Chest Port 1 View  Result Date: 11/18/2021 IMPRESSION: Stable chest radiograph. Electronically Signed   By: Valentino Saxon M.D.   On: 11/18/2021 14:58    Cardiac  Studies   Limited echo 11/16/2021: 1. Left ventricular ejection fraction, by estimation, is 30 to 35%. The  left ventricle has moderately decreased function. The left ventricle  demonstrates global hypokinesis. The left ventricular internal cavity size  was moderately dilated.   2. Right ventricular systolic function is moderately reduced. The right  ventricular size is moderately enlarged. Tricuspid regurgitation signal is  inadequate for assessing PA pressure.   3. Left atrial size was moderately dilated.   4. The mitral valve is normal in structure. No evidence of mitral valve  regurgitation. No evidence of mitral stenosis.   5. The aortic valve is normal in structure. Aortic valve regurgitation is  not visualized. No aortic stenosis is present.   6. The inferior vena cava is dilated in size with <50% respiratory  variability, suggesting right atrial pressure of 15 mmHg. __________   Coronary arthrectomy 08/15/2021:   Mid LM to Dist LM lesion is 30% stenosed.   Ost LAD lesion is 85% stenosed.   Prox LAD lesion is 50% stenosed.   Dist LAD lesion is 35% stenosed.   Prox Cx lesion is 50% stenosed.   Prox RCA to Dist RCA lesion is 100% stenosed.   1st Diag lesion is 80% stenosed.   1st Mrg lesion is 70% stenosed.   A drug-eluting stent was successfully placed using a STENT ONYX FRONTIER 2.75X26.   Post intervention, there is a 0% residual stenosis.   Post intervention, there is a 0% residual stenosis.   Post intervention, there is a 0% residual stenosis.   Successful Impella supported high risk PCI of the ostial LAD with atherectomy and drug-eluting stent placement.  The stent extended 1 to 2 mm into the left main coronary artery to ensure coverage of the ostium.  There was normal flow into the  left circumflex. Moderately elevated left ventricular end-diastolic pressure at 24 mmHg at the beginning of the case.   Recommendations: Dual antiplatelet therapy for at least 12 months and  preferably longer. Aggressive treatment of risk factors. Optimize heart failure therapy.  Monitor renal function closely.  135 mL of contrast was used. __________   [facility] 08/14/2021: Conclusions: Severe multivessel coronary artery disease, including 80-90% ostial LAD stenosis and chronic total occlusion of the proximal RCA, a seen on prior catheterization in 11/2020. Moderately  elevated left heart, right heart, and pulmonary artery pressures. Moderately-severely reduced cardiac output/index. Ectatic abdominal aorta common/external iliac arteries, and common femoral arteries without significant disease.  Focal 70% right internal iliac artery stenosis is noted. 50% ostial stenosis of right renal artery.   Recommendations: Transfer to [facility] for consideration of high risk PCI to LMCA/ostial LAD.  Given heavy calcification and reduced LVEF, atherectomy and Impella support will need to be considered.  I will load the patient with clopidogrel 600 mg today, followed by 75 mg daily thereafter. Maintain net even to slightly negative fluid balance following contrast exposure today and chronic kidney disease. Escalate goal-directed medical therapy of acute on chronic HFrEF, as tolerated. Aggressive secondary prevention of coronary artery disease. __________   2D echo 08/10/2021: 1. Left ventricular ejection fraction, by estimation, is 25 to 30%. The  left ventricle has severely decreased function. The left ventricle  demonstrates global hypokinesis with severe hypokinesis of the anterior,  septal, and apical region. Inferior wall  best preserved. Left ventricular diastolic parameters are consistent with  Grade II diastolic dysfunction (pseudonormalization). The average left  ventricular global longitudinal strain is -6.3 %. The global longitudinal  strain is abnormal.   2. Right ventricular systolic function is moderately reduced. The right  ventricular size is moderately enlarged. There is  mildly elevated  pulmonary artery systolic pressure. The estimated right ventricular  systolic pressure is 06.2 mmHg.   3. Left atrial size was moderately dilated.   4. The mitral valve is normal in structure. Moderate to severe mitral  valve regurgitation.   5. Tricuspid valve regurgitation is mild to moderate.   6. Aortic dilatation noted. There is mild dilatation of the aortic root,  measuring 39 mm.   7. The inferior vena cava is dilated in size with <50% respiratory  variability, suggesting right atrial pressure of 15 mmHg.  Patient Profile     88 y.o. male with history of CAD status post high risk Impella supported PCI/arthrectomy to the LAD in 07/2021, chronic combined systolic and diastolic CHF, ICM, CVA, DM, HTN, HLD, PAD, carotid artery disease, anemia, lower extremity cellulitis, and GERD who was admitted from our office on 1/26 with acute on chronic combined CHF and electrolyte derangements after failing outpatient diuresis.  Assessment & Plan    1. Acute on chronic combined systolic and diastolic CHF/ICM/pulomnary hypertension: -Volume status much improved, though he remains volume up, with associated orthopnea  -Status post right-sided thoracentesis 1/29 with 1.7 L of fluid removed with path review negative for malignancy  -Metoprolol and losartan have been held secondary to hypotension with BP room needed for IV diuresis -Renal function has improved with diuresis  -He has noted a drop in in UOP over the past 24 hours -Will give a dose of metolazone 2.5 mg with IV Lasix this afternoon, he has refused dosage escalation previously secondary to headache -Potassium 4.6 -Continue spironolactone  -Escalate GDMT as able, this has been limited by hypotension -Digoxin is an option, though may be risky given his age and comorbid conditions    2. CAD involving the native coronary arteries without angina with mildly elevated high sensitivity troponin: -No chest pain -High  sensitivity troponin mildly elevated and flat trending, not consistent with ACS; likely supply demand ischemia with known CAD in the context of volume overload, AKI, and anemia -ASA -Plavix -No plans for inpatient ischemic    3. Frequent PVCs: -Improved -Possibly driving some of his cardiomyopathy  -Continue amiodarone per EP -Repeat Zio  patch as an outpatient to quantify PVC burden  -Resume beta blocker possibly tomorrow  -Potassium and magnesium at goal   4. Anemia: -Stable   5. HLD: -LDL 29 in 07/2021 -Lipitor     For questions or updates, please contact [place] Please consult [email or web address] for contact info under Cardiology/STEMI.    Signed, Christell Faith, PA-C [place] Pager: [phone] 11/20/2021, 11:28 AM

## 2021-11-20 NOTE — Consult Note (Addendum)
° °  Heart Failure Nurse Navigator Note  HFrEF 5 to 30%.  Grade 2 diastolic dysfunction.  Abnormal global longitudinal strain.  Ventricular systolic function is moderately reduced.  Moderate to severe mitral regurgitation.  Mild to moderate tricuspid regurgitation.  He was sent to the emergency room by his cardiologist after he failed outpatient diuresis.    Comorbidities:  Coronary artery disease with PCI atherectomy to LAD on 07/2021 Ischemic cardiomyopathy CVA Diabetes Hypertension Hyperlipidemia Peripheral arterial disease GERD  Medications:  Amiodarone 200 mg every 8 hours Aspirin 81 mg daily Atorvastatin 40 mg daily Plavix 75 mg daily Furosemide 40 mg IV 2 times daily Potassium chloride 20 mEq 2 times daily Spironolactone 12 and half milligrams daily  GDMT is limited due to hypotension.  Labs:  Sodium 129, potassium 4.6, chloride 92, CO2 25, BUN 28, creatinine 0.86, GFR greater than 60 Weight is 84.2 kg Blood pressure 115/68 Intake 720 mL Output 950 mL  During this hospitalization underwent right-sided thoracentesis which resulted in 1700 mL being removed of amber-colored fluid.  Met with patient today, he is sitting up in the chair at bedside on room air.  States he feels good this morning and is asking about going home.  Discussed daily weights and what to report.  He has a system called the weight GURU-his scale and his phone communicate with documenting his daily weights.  Discussed his diet, he states he does not use salt at the table nor did he feel that he ate any foods that contain an abnormal amount of sodium.  Also discussed his fluid restriction, he states that he drinks 4-17 ounce bottles of water daily.  He denied any other fluid intake.  Is follow-up in the outpatient heart failure clinic on February 6 at 2 PM.  He has a 2% ratio of no-shows which is 4 out of 176 appointments.     Pricilla Riffle RN [facility]

## 2021-11-20 NOTE — Progress Notes (Signed)
Progress Note   Patient: Henry Lindsey CZY:[id number] DOB: 11/20/1932 DOA: 11/15/2021     5 DOS: the patient was seen and examined on 11/20/2021   Brief hospital course: 88 y.o. male with medical history significant for coronary artery disease status post stent angioplasty, history of chronic systolic/diastolic CHF with EF 25 to 30%, history of COPD on 2 L of oxygen continuous, insulin-dependent diabetes mellitus, hypertension, GERD, CVA who was sent to the ED on 11/15/2021 from his cardiologist office for evaluation of worsening shortness of breath from his baseline associated with orthopnea, increased abdominal girth, lower extremity swelling and weight gain of about 13 pounds. Admitted to the hospital for IV diuresis for acutely decompensated CHF.  Cardiology consulted.  Assessment and Plan: * Acute on chronic combined systolic and diastolic CHF (congestive heart failure) ([place])- (present on admission) Patient presented with worsening shortness of breath with exertion, orthopnea and bilateral lower extremity swelling and weight gain of approximately 20 pounds since October.    Echo 08/10/21 showed LVEF 25-30%, G2DD, WMA, moderately reduced RV function, RV systolic pressure 09.3ATFT, mod to severe MR, mild to mod TR, mild aortic root dilation.  High PVC burden may be contributing. -- Cardiology consulted, appreciate recommendations -- IV Lasix 40 IV BID --Metolazone planned today to augment diuresis -- Metoprolol on hold due to soft blood pressure --On low-dose spironolactone -- Appears no longer on losartan likely due to blood pressure --Not on SGLT2 inhibitor, consider --Previously did not tolerate Entresto due to hypotension -- Strict I/O's and daily weights -- Monitor renal function and electrolytes  1/31: Patient reports breathing improved but still with orthopnea  Pulmonary hypertension ([place])- (present on admission) Stable.  Pleural effusion on right- (present on  admission) Admission chest x-ray showed moderate sized right pleural effusion significantly increased in size, mild increase in size of a small left pleural effusion.  Associated bibasilar atelectasis. Oxygen requirements are stable. Status postthoracentesis at the bedside on 1/29 by Dr. Bridgett Larsson. Cytology Negative for malignancy -- Follow pleural fluid cultures, neg to date  Ischemic cardiomyopathy- (present on admission) Management as outlined above. Cardiology following.  Continue aspirin Plavix.  Chronic respiratory failure with hypoxia ([place])- (present on admission) Baseline oxygen requirement of 2 L/min in the setting of COPD.  Oxygen requirement appears stable at this time.  Monitor.  COPD (chronic obstructive pulmonary disease) ([place])- (present on admission) Chronically on 2 L minute home oxygen.  No signs or symptoms of exacerbation at this time.  Monitor closely.  Continue bronchodilators as needed  Hyponatremia- (present on admission) Presented with sodium 124.  Likely hypervolemic in the setting of acutely decompensated CHF and volume overload.   Sodium levels improving with diuresis as expected 1/31: sodium 130>>129 today. Had less UO yesterday.  Infection of prosthetic right knee joint ([place]) Patient is following with ID and on Keflex and rifampin for 6 more weeks. -- Resumed on rifampin (previously recommended but held off due to elevated LFTs which have normalized) --Continue Keflex --Monitor LFTs -- ID follow-up as scheduled  CKD (chronic kidney disease), stage IIIa- (present on admission) Currently normal renal function with creatinine on admission 1.00.   Creatinine today stable 0.86. Monitor renal function with diuresis, so far Cr improving.   Type II diabetes mellitus with renal manifestations ([place])- (present on admission) Appears to be on 70/30 insulin and glipizide at home. --Sliding scale NovoLog and CBGs. --Consider SGLT2 inhibitor given CHF. --Holding  glipizide.  Gastroesophageal reflux disease without esophagitis- (present on admission) Continue Protonix  BPH with obstruction/lower urinary tract symptoms- (present on admission) Continue Flomax  BPH (benign prostatic hyperplasia) Stable continue Flomax        Subjective: Pt up in recliner when seen.  Reports breathing is better but still orthopneic and was only able to get some sleep in recliner.  Reports not resting very well and feels weak and tired.  No other acute complaints.   Physical Exam: Vitals:   11/20/21 0500 11/20/21 0736 11/20/21 1145 11/20/21 1500  BP:  115/68 113/65 118/68  Pulse:  90 89 87  Resp:  18 17   Temp:  97.7 F (36.5 C) 98 F (36.7 C) 97.8 F (36.6 C)  TempSrc:  Oral  Oral  SpO2:  98% 98% 98%  Weight: 84.2 kg     Height:       General exam: awake, alert, no acute distress HEENT: oist mucus membranes, hearing grossly normal  Respiratory system: CTAB, no wheezes, rales or rhonchi, normal respiratory effort. Cardiovascular system: normal S1/S2, RRR, trace LE edema.   Central nervous system: A&O x3. no gross focal neurologic deficits, normal speech Extremities: moves all, LE venous stasis, normal tone Skin: dry, intact, normal temperature Psychiatry: normal mood, congruent affect, judgement and insight appear normal   Data Reviewed:  Labs notable for Na 130>>129, Cl 92, glucose 111, Hbg 9.9>>9.7,   Family Communication: None at bedside will attempt to call as time allows  Disposition: Status is: Inpatient Remains inpatient appropriate because: remains on IV diuresis for volume overload   Planned Discharge Destination: Home           Time spent: 35 minutes  Author: Ezekiel Slocumb, DO 11/20/2021 7:05 PM  For on call review [email or web address].

## 2021-11-21 DIAGNOSIS — I2510 Atherosclerotic heart disease of native coronary artery without angina pectoris: Secondary | ICD-10-CM

## 2021-11-21 LAB — BASIC METABOLIC PANEL
Anion gap: 11 (ref 5–15)
BUN: 32 mg/dL — ABNORMAL HIGH (ref 8–23)
CO2: 27 mmol/L (ref 22–32)
Calcium: 8.8 mg/dL — ABNORMAL LOW (ref 8.9–10.3)
Chloride: 91 mmol/L — ABNORMAL LOW (ref 98–111)
Creatinine, Ser: 1.07 mg/dL (ref 0.61–1.24)
GFR, Estimated: >60 mL/min (ref >60)
Glucose, Bld: 100 mg/dL — ABNORMAL HIGH (ref 70–99)
Potassium: 4.6 mmol/L (ref 3.5–5.1)
Sodium: 129 mmol/L — ABNORMAL LOW (ref 135–145)

## 2021-11-21 LAB — GLUCOSE, CAPILLARY
Glucose-Capillary: 111 mg/dL — ABNORMAL HIGH (ref 70–99)
Glucose-Capillary: 177 mg/dL — ABNORMAL HIGH (ref 70–99)

## 2021-11-21 MED ORDER — METOLAZONE 2.5 MG PO TABS: 2.5000 mg | ORAL_TABLET | ORAL | Status: DC

## 2021-11-21 MED ORDER — AMIODARONE HCL 200 MG PO TABS: 200.0000 mg | ORAL_TABLET | Freq: Two times a day (BID) | ORAL | 1 refills | Status: DC

## 2021-11-21 MED ORDER — RIFAMPIN 300 MG PO CAPS: 300.0000 mg | ORAL_CAPSULE | Freq: Two times a day (BID) | ORAL | 1 refills | Status: DC

## 2021-11-21 MED ORDER — POTASSIUM CHLORIDE CRYS ER 10 MEQ PO TBCR
10.0000 meq | EXTENDED_RELEASE_TABLET | Freq: Two times a day (BID) | ORAL | Status: DC
Start: 2021-11-21 — End: 2021-11-21

## 2021-11-21 MED ORDER — AMIODARONE HCL 200 MG PO TABS: 200.0000 mg | ORAL_TABLET | Freq: Two times a day (BID) | ORAL | Status: DC

## 2021-11-21 MED ORDER — CEPHALEXIN 500 MG PO CAPS: 500.0000 mg | ORAL_CAPSULE | Freq: Three times a day (TID) | ORAL | 1 refills | Status: DC

## 2021-11-21 NOTE — Progress Notes (Addendum)
Progress Note  Patient Name: Henry Lindsey Date of Encounter: 11/21/2021  Primary Cardiologist: Rockey Situ  Subjective   Dyspnea much improved. Back to baseline. No chest pain. He does feel some fatigue this morning. Renal function is starting to trend upwards with inpatient diuresis. Documented UOP 940 mL for the past 24 hours, net - 5.5 L for the admission, plus thoracentesis. Weight 84.2-->84.7 kg over the past 24 hours. Lower extremity swelling much improved. Feels ready for discharge.   Inpatient Medications    Scheduled Meds:  amiodarone  200 mg Oral Q8H   ascorbic acid  500 mg Oral Daily   aspirin EC  81 mg Oral Daily   atorvastatin  40 mg Oral Daily   cephALEXin  500 mg Oral Q8H   cholecalciferol  1,000 Units Oral Daily   clopidogrel  75 mg Oral Daily   docusate sodium  100 mg Oral BID   enoxaparin (LOVENOX) injection  40 mg Subcutaneous Q24H   furosemide  40 mg Oral BID   gabapentin  400 mg Oral TID   insulin aspart  0-15 Units Subcutaneous TID WC   latanoprost  1 drop Both Eyes QHS   melatonin  5 mg Oral QHS   multivitamin with minerals  1 tablet Oral q AM   pantoprazole  40 mg Oral Daily   potassium chloride SA  20 mEq Oral BID   rifampin  300 mg Oral Q12H   spironolactone  12.5 mg Oral Daily   tamsulosin  0.4 mg Oral QPC breakfast   timolol  1 drop Both Eyes Daily   vitamin B-12  500 mcg Oral Daily   Continuous Infusions:  PRN Meds: acetaminophen **OR** acetaminophen, albuterol, ALPRAZolam, cyclobenzaprine, HYDROcodone-acetaminophen, nitroGLYCERIN, ondansetron **OR** ondansetron (ZOFRAN) IV, tiZANidine   Vital Signs    Vitals:   11/20/21 2154 11/20/21 2338 11/21/21 0500 11/21/21 0713  BP: 115/71 127/75  97/79  Pulse: 81 82  92  Resp: 20 15  18   Temp: 97.9 F (36.6 C) 97.6 F (36.4 C)  98 F (36.7 C)  TempSrc:    Oral  SpO2: 100% 100%  92%  Weight:   84.7 kg   Height:        Intake/Output Summary (Last 24 hours) at 11/21/2021 0859 Last data filed  at 11/21/2021 0715 Gross per 24 hour  Intake 480 ml  Output 1875 ml  Net -1395 ml    Filed Weights   11/19/21 0910 11/20/21 0500 11/21/21 0500  Weight: 84.8 kg 84.2 kg 84.7 kg    Telemetry    SR with improved PVC burden - Personally Reviewed  ECG    No new tracings - Personally Reviewed  Physical Exam   GEN: No acute distress.   Neck: No JVD. Cardiac: RRR, II/VI systolic murmur at the LSB, no rubs, or gallops.  Respiratory: Mildly diminished with faint crackles along the left base.  GI: Soft, nontender, non-distended.   MS: Mild, improving lower extremity edema; No deformity. Neuro:  Alert and oriented x 3; Nonfocal.  Psych: Normal affect.  Labs    Chemistry Recent Labs  Lab 11/17/21 2021 11/18/21 0529 11/19/21 0446 11/20/21 0816 11/21/21 0452  NA 125* 129* 130* 129* 129*  K 4.5 4.0 4.6 4.6 4.6  CL 86* 90* 91* 92* 91*  CO2 28 29 30 25 27   GLUCOSE 162* 116* 114* 111* 100*  BUN 42* 37* 29* 28* 32*  CREATININE 0.94 0.90 0.88 0.86 1.07  CALCIUM 9.1 8.8* 9.1 8.7*  8.8*  PROT 7.6 7.4 7.5  --   --   ALBUMIN 3.2* 3.1* 3.1*  --   --   AST 35 32 34  --   --   ALT 11 9 11   --   --   ALKPHOS 100 102 108  --   --   BILITOT 2.3* 1.9* 2.9*  --   --   GFRNONAA >60 >60 >60 >60 >60  ANIONGAP 11 10 9 12 11       Hematology Recent Labs  Lab 11/18/21 0529 11/19/21 0446 11/20/21 0816  WBC 4.5 6.0 6.0  RBC 4.24 4.21* 4.19*  HGB 9.8* 9.9* 9.7*  HCT 33.1* 34.0* 33.3*  MCV 78.1* 80.8 79.5*  MCH 23.1* 23.5* 23.2*  MCHC 29.6* 29.1* 29.1*  RDW 18.5* 18.5* 19.0*  PLT 127* 130* 132*     Cardiac EnzymesNo results for input(s): TROPONINI in the last 168 hours. No results for input(s): TROPIPOC in the last 168 hours.   BNP Recent Labs  Lab 11/15/21 1304 11/15/21 1647  BNP 2,665.8* 2,065.7*      DDimer No results for input(s): DDIMER in the last 168 hours.   Radiology    DG Chest Port 1 View  Result Date: 11/18/2021 IMPRESSION: Stable chest radiograph.  Electronically Signed   By: Valentino Saxon M.D.   On: 11/18/2021 14:58    Cardiac Studies   Limited echo 11/16/2021: 1. Left ventricular ejection fraction, by estimation, is 30 to 35%. The  left ventricle has moderately decreased function. The left ventricle  demonstrates global hypokinesis. The left ventricular internal cavity size  was moderately dilated.   2. Right ventricular systolic function is moderately reduced. The right  ventricular size is moderately enlarged. Tricuspid regurgitation signal is  inadequate for assessing PA pressure.   3. Left atrial size was moderately dilated.   4. The mitral valve is normal in structure. No evidence of mitral valve  regurgitation. No evidence of mitral stenosis.   5. The aortic valve is normal in structure. Aortic valve regurgitation is  not visualized. No aortic stenosis is present.   6. The inferior vena cava is dilated in size with <50% respiratory  variability, suggesting right atrial pressure of 15 mmHg. __________   Coronary arthrectomy 08/15/2021:   Mid LM to Dist LM lesion is 30% stenosed.   Ost LAD lesion is 85% stenosed.   Prox LAD lesion is 50% stenosed.   Dist LAD lesion is 35% stenosed.   Prox Cx lesion is 50% stenosed.   Prox RCA to Dist RCA lesion is 100% stenosed.   1st Diag lesion is 80% stenosed.   1st Mrg lesion is 70% stenosed.   A drug-eluting stent was successfully placed using a STENT ONYX FRONTIER 2.75X26.   Post intervention, there is a 0% residual stenosis.   Post intervention, there is a 0% residual stenosis.   Post intervention, there is a 0% residual stenosis.   Successful Impella supported high risk PCI of the ostial LAD with atherectomy and drug-eluting stent placement.  The stent extended 1 to 2 mm into the left main coronary artery to ensure coverage of the ostium.  There was normal flow into the  left circumflex. Moderately elevated left ventricular end-diastolic pressure at 24 mmHg at the beginning  of the case.   Recommendations: Dual antiplatelet therapy for at least 12 months and preferably longer. Aggressive treatment of risk factors. Optimize heart failure therapy.  Monitor renal function closely.  135 mL of contrast was  used. __________   [facility] 08/14/2021: Conclusions: Severe multivessel coronary artery disease, including 80-90% ostial LAD stenosis and chronic total occlusion of the proximal RCA, a seen on prior catheterization in 11/2020. Moderately elevated left heart, right heart, and pulmonary artery pressures. Moderately-severely reduced cardiac output/index. Ectatic abdominal aorta common/external iliac arteries, and common femoral arteries without significant disease.  Focal 70% right internal iliac artery stenosis is noted. 50% ostial stenosis of right renal artery.   Recommendations: Transfer to [facility] for consideration of high risk PCI to LMCA/ostial LAD.  Given heavy calcification and reduced LVEF, atherectomy and Impella support will need to be considered.  I will load the patient with clopidogrel 600 mg today, followed by 75 mg daily thereafter. Maintain net even to slightly negative fluid balance following contrast exposure today and chronic kidney disease. Escalate goal-directed medical therapy of acute on chronic HFrEF, as tolerated. Aggressive secondary prevention of coronary artery disease. __________   2D echo 08/10/2021: 1. Left ventricular ejection fraction, by estimation, is 25 to 30%. The  left ventricle has severely decreased function. The left ventricle  demonstrates global hypokinesis with severe hypokinesis of the anterior,  septal, and apical region. Inferior wall  best preserved. Left ventricular diastolic parameters are consistent with  Grade II diastolic dysfunction (pseudonormalization). The average left  ventricular global longitudinal strain is -6.3 %. The global longitudinal  strain is abnormal.   2. Right ventricular systolic function  is moderately reduced. The right  ventricular size is moderately enlarged. There is mildly elevated  pulmonary artery systolic pressure. The estimated right ventricular  systolic pressure is 12.7 mmHg.   3. Left atrial size was moderately dilated.   4. The mitral valve is normal in structure. Moderate to severe mitral  valve regurgitation.   5. Tricuspid valve regurgitation is mild to moderate.   6. Aortic dilatation noted. There is mild dilatation of the aortic root,  measuring 39 mm.   7. The inferior vena cava is dilated in size with <50% respiratory  variability, suggesting right atrial pressure of 15 mmHg.  Patient Profile     88 y.o. male with history of CAD status post high risk Impella supported PCI/arthrectomy to the LAD in 07/2021, chronic combined systolic and diastolic CHF, ICM, CVA, DM, HTN, HLD, PAD, carotid artery disease, anemia, lower extremity cellulitis, and GERD who was admitted from our office on 1/26 with acute on chronic combined CHF and electrolyte derangements after failing outpatient diuresis.  Assessment & Plan    1. Acute on chronic combined systolic and diastolic CHF/ICM/pulomnary hypertension: -Volume status much improved, and he reports he is back to his baseline -Status post right-sided thoracentesis 1/29 with 1.7 L of fluid removed with path review negative for malignancy  -Metoprolol and losartan have been held secondary to hypotension -Renal function improved with diuresis, and is now starting to trend upwards -Continue Lasix 40 mg bid along with spironolactone  -With spironolactone, I will go ahead and decrease his supplemental KCl as well to decrease risk of significant hyperkalemia  -Add metolazone 2.5 mg to be taken twice weekly on Tuesdays and Thursdays  -Escalate GDMT as able, this has been limited by hypotension -Digoxin is an option, though may be risky given his age and comorbid conditions  -CHF education    2. CAD involving the native  coronary arteries without angina with mildly elevated high sensitivity troponin: -No chest pain -High sensitivity troponin mildly elevated and flat trending, not consistent with ACS; likely supply demand ischemia with known  CAD in the context of volume overload, AKI, and anemia -ASA -Plavix -No plans for inpatient ischemic    3. Frequent PVCs: -Improved -Possibly driving some of his cardiomyopathy  -Continue amiodarone per EP, decrease dose to bid dosing at this time with plans for further tapering in the office  -Repeat Zio patch as an outpatient to quantify PVC burden  -Resume beta blocker possibly tomorrow  -Potassium and magnesium at goal   4. Anemia: -Stable   5. HLD: -LDL 29 in 07/2021 -Lipitor   He would like to be discharged today, I think this is reasonable on current medications. I will arrange for follow up in our office.     For questions or updates, please contact [place] Please consult [email or web address] for contact info under Cardiology/STEMI.    Signed, Christell Faith, PA-C [facility] Pager: [phone] 11/21/2021, 8:59 AM

## 2021-11-21 NOTE — TOC Transition Note (Signed)
Transition of Care [facility]) - CM/SW Discharge Note   Patient Details  Name: Henry Lindsey MRN: [id number] Date of Birth: 11-Nov-1932  [facility]) CM/SW Contact:  Candie Chroman, LCSW Phone Number: 11/21/2021, 2:12 PM   Clinical Narrative:   Patient has orders to discharge home today. Per PT note, patient ambulating at baseline level. [facility] is no longer able to accept patient without PT. Nursing would not be able to see him for two weeks. CSW called into the room and his friend answered. She asked him if he still wanted a HHRN but patient declined. Told her to have him contact his PCP if he changes his mind after discharge. No further concerns. CSW signing off.  Final next level of care: Home/Self Care Barriers to Discharge: Barriers Resolved   Patient Goals and CMS Choice Patient states their goals for this hospitalization and ongoing recovery are:: Patient wants to get rid of this fluid      Discharge Placement                  Name of family member notified: Friend at bedside Patient and family notified of of transfer: 11/21/21  Discharge Plan and Services   Discharge Planning Services: CM Consult            DME Arranged: N/A DME Agency: NA                  Social Determinants of Health ([place]) Interventions     Readmission Risk Interventions Readmission Risk Prevention Plan 11/16/2021 08/17/2021  Transportation Screening Complete Complete  PCP or Specialist Appt within 3-5 Days - Complete  HRI or [place] - Complete  Social Work Consult for [place] Planning/Counseling - Complete  Palliative Care Screening - Not Applicable  Medication Review Press photographer) Complete Complete  PCP or Specialist appointment within 3-5 days of discharge Complete -  [place] or Home Care Consult Complete -  SW Recovery Care/Counseling Consult Complete -  Palliative Care Screening Not Applicable -  [place] Not Applicable -  Some recent  data might be hidden

## 2021-11-21 NOTE — Progress Notes (Signed)
PT Cancellation Note  Patient Details Name: Henry Lindsey MRN: [id number] DOB: 09-04-1933   Cancelled Treatment:    Reason Eval/Treat Not Completed: Other (comment). COnsult received and chart reviewed. Pt reports he has been ambulatory in room, received in standing. Reports he is at his baseline level and no PT services required. Will cancel current order. Please re-consult if needs change.   Jaxie Racanelli 11/21/2021, 1:49 PM Greggory Stallion, PT, DPT, GCS [phone]

## 2021-11-21 NOTE — Discharge Summary (Signed)
Henry Lindsey:[id number] DOB: [birth year] DOA: 11/15/2021  PCP: Baxter Hire, MD  Admit date: 11/15/2021 Discharge date: 11/21/2021  Time spent: 40 minutes  Recommendations for Outpatient Follow-up:  Cardiology f/u 2/6 @ 9:20 am Check bmp at that time     Discharge Diagnoses:  Principal Problem:   Acute on chronic combined systolic and diastolic CHF (congestive heart failure) ([place]) Active Problems:   BPH with obstruction/lower urinary tract symptoms   Gastroesophageal reflux disease without esophagitis   Ischemic cardiomyopathy   Type II diabetes mellitus with renal manifestations (HCC)   COPD (chronic obstructive pulmonary disease) (HCC)   CKD (chronic kidney disease), stage IIIa   Hyponatremia   Infection of prosthetic right knee joint (HCC)   Pleural effusion on right   Pulmonary hypertension ([place])   Chronic respiratory failure with hypoxia ([place])   Discharge Condition: stable  Diet recommendation: heart healthy  Filed Weights   11/19/21 0910 11/20/21 0500 11/21/21 0500  Weight: 84.8 kg 84.2 kg 84.7 kg    History of present illness:  Henry Lindsey is a 88 y.o. male with medical history significant for coronary artery disease status post stent angioplasty, history of chronic combined systolic and diastolic dysfunction CHF with last known LVEF of 25 to 30% with severely decreased LV systolic function and grade 2 diastolic dysfunction.2D echocardiogram also shows moderately reduced RV systolic function with elevated right ventricular systolic pressure of 97.3ZHGD, history of COPD with chronic respiratory failure on 2 L of oxygen continuous, insulin-dependent diabetes mellitus, hypertension, GERD, CVA who was sent to the emergency room from his cardiologist office for evaluation of worsening shortness of breath from his baseline associated with orthopnea, increased abdominal girth, lower extremity swelling and increased weight gain.  Patient states that his baseline weight is  about 185 pounds and he was 198 pounds at the cardiologist office. He admits to being compliant with his medications and denies any dietary discretion. He has a cough productive of clear phlegm but denies having any fever, no chills, no changes in his bowel habits, no urinary frequency, no nocturia, no dysuria, no headache, no blurred vision, no focal deficit.  Hospital Course:  Acute on chronic combined systolic and diastolic CHF (congestive heart failure) ([place])- (present on admission) Patient presented with worsening shortness of breath with exertion, orthopnea and bilateral lower extremity swelling and weight gain of approximately 20 pounds since October.   Echo 08/10/21 showed LVEF 25-30%, G2DD, WMA, moderately reduced RV function, RV systolic pressure 92.4QAST, mod to severe MR, mild to mod TR, mild aortic root dilation. Here treated with IV lasix. Discharged on home diuretic regimen w/ close cardiology and heart failure clinic f/u.    Pleural effusion on right- (present on admission) Admission chest x-ray showed moderate sized right pleural effusion significantly increased in size, mild increase in size of a small left pleural effusion.  Associated bibasilar atelectasis. Oxygen requirements are stable. Status postthoracentesis at the bedside on 1/29 by Dr. Bridgett Larsson. Cytology Negative for malignancy, culture NGTD   Ischemic cardiomyopathy- (present on admission) Management as outlined above. Cardiology following.  Continue aspirin Plavix.   Chronic respiratory failure with hypoxia ([place])- (present on admission) Baseline oxygen requirement of 2 L/min in the setting of COPD.  Oxygen requirement appears stable at this time.     COPD (chronic obstructive pulmonary disease) ([place])- (present on admission) Chronically on 2 L minute home oxygen.  No signs or symptoms of exacerbation at this time.    Hyponatremia- (present on admission) Presented  with sodium 124.  Likely hypervolemic in the setting  of acutely decompensated CHF and volume overload.   Sodium levels improved with diuresis but remained hyponatremic ~130, will need to continue to monitor as outpt   Infection of prosthetic right knee joint [facility]) Patient is following with ID and on Keflex and rifampin for 6 more weeks. -- Resumed on rifampin (previously recommended but held off due to elevated LFTs which have normalized) --Continue Keflex --Monitor LFTs -- ID follow-up as scheduled   CKD (chronic kidney disease), stage IIIa- (present on admission) stable   Type II diabetes mellitus with renal manifestations ([place])- (present on admission) Appears to be on 70/30 insulin and glipizide at home. - consider sglt2i as outpt     Procedures: thoracentesis   Consultations: cardiology  Discharge Exam: Vitals:   11/21/21 0713 11/21/21 1124  BP: 97/79 118/66  Pulse: 92 91  Resp: 18 18  Temp: 98 F (36.7 C) 97.9 F (36.6 C)  SpO2: 92% 98%    General exam: awake, alert, no acute distress HEENT: oist mucus membranes, hearing grossly normal  Respiratory system: CTAB, no wheezes, rales or rhonchi, normal respiratory effort. Cardiovascular system: normal S1/S2, RRR, trace LE edema.   Central nervous system: A&O x3. no gross focal neurologic deficits, normal speech Extremities: moves all, LE venous stasis changes, normal tone Skin: dry, intact, normal temperature Psychiatry: normal mood, congruent affect, judgement and insight appear normal  Discharge Instructions   Discharge Instructions     Diet - low sodium heart healthy   Complete by: As directed    Increase activity slowly   Complete by: As directed    No wound care   Complete by: As directed       Allergies as of 11/21/2021       Reactions   Jardiance [empagliflozin]    Rash all over and lips turn purple.    Metformin And Related Diarrhea   Trazodone Other (See Comments)   Extreme hallucinations/ psychosis    Torsemide Rash   "Skin reaction"         Medication List     STOP taking these medications    metoprolol succinate 50 MG 24 hr tablet Commonly known as: TOPROL-XL       TAKE these medications    albuterol 108 (90 Base) MCG/ACT inhaler Commonly known as: VENTOLIN HFA Inhale 2 puffs into the lungs every 6 (six) hours as needed for shortness of breath or wheezing.   amiodarone 200 MG tablet Commonly known as: PACERONE Take 1 tablet (200 mg total) by mouth 2 (two) times daily.   ascorbic acid 500 MG tablet Commonly known as: VITAMIN C Take 500 mg by mouth daily.   aspirin EC 81 MG tablet Take 1 tablet (81 mg total) by mouth daily. Swallow whole.   atorvastatin 40 MG tablet Commonly known as: LIPITOR Take 1 tablet (40 mg total) by mouth daily.   cephALEXin 500 MG capsule Commonly known as: KEFLEX Take 1 capsule (500 mg total) by mouth every 8 (eight) hours.   cholecalciferol 25 MCG (1000 UNIT) tablet Commonly known as: VITAMIN D Take 1,000 Units by mouth daily.   clopidogrel 75 MG tablet Commonly known as: PLAVIX TAKE 1 TABLET(75 MG) BY MOUTH DAILY WITH BREAKFAST   cyclobenzaprine 5 MG tablet Commonly known as: FLEXERIL Take 5-10 mg by mouth daily as needed for muscle spasms.   diclofenac Sodium 1 % Gel Commonly known as: VOLTAREN Apply 2 g topically 2 (two) times daily  as needed (pain).   docusate sodium 100 MG capsule Commonly known as: COLACE Take 1 capsule (100 mg total) by mouth 2 (two) times daily.   furosemide 40 MG tablet Commonly known as: LASIX Take 1 tablet (40 mg total) by mouth 2 (two) times daily.   gabapentin 400 MG capsule Commonly known as: NEURONTIN Take 400 mg by mouth 2 (two) times daily.   glipiZIDE 10 MG tablet Commonly known as: GLUCOTROL Take 10 mg by mouth daily before breakfast.   HYDROcodone-acetaminophen 5-325 MG tablet Commonly known as: NORCO/VICODIN Take 1 tablet by mouth every 4 (four) hours as needed for moderate pain (pain score 4-6).   INS  SYRINGE/NEEDLE 1CC/28G 28G X 1/2" 1 ML Misc Commonly known as: B-D INSULIN SYRINGE 1CC/28G USE TO ADMINISTER INSULIN DAILY   insulin aspart protamine- aspart (70-30) 100 UNIT/ML injection Commonly known as: NOVOLOG MIX 70/30 Inject 0.15 mLs (15 Units total) into the skin 2 (two) times daily with a meal.   latanoprost 0.005 % ophthalmic solution Commonly known as: XALATAN Place 1 drop into both eyes at bedtime.   losartan 25 MG tablet Commonly known as: COZAAR Take 0.5 tablets (12.5 mg total) by mouth daily.   metolazone 2.5 MG tablet Commonly known as: ZAROXOLYN Take 1 tablet (2.5 mg total) by mouth 2 (two) times a week. Take this 1/2 hour before furosemide   multivitamin with minerals Tabs tablet Take 1 tablet by mouth in the morning.   nitroGLYCERIN 0.4 MG SL tablet Commonly known as: NITROSTAT Place 1 tablet (0.4 mg total) under the tongue every 5 (five) minutes as needed for chest pain. MAXIMUM 3 TABLETS   ONE TOUCH ULTRA SYSTEM KIT w/Device Kit 1 kit by Does not apply route once. Use DX code E11.59   ONE TOUCH ULTRA TEST test strip Generic drug: glucose blood USE 1 STRIP TO TEST THREE TIMES DAILY AS DIRECTED   OneTouch Delica Lancets 84T Misc Use three times daily to check blood sugar.   oxyCODONE 5 MG immediate release tablet Commonly known as: Oxy IR/ROXICODONE Take 5 mg by mouth every 4 (four) hours as needed for severe pain.   pantoprazole 40 MG tablet Commonly known as: PROTONIX Take 40 mg by mouth daily.   potassium chloride SA 20 MEQ tablet Commonly known as: KLOR-CON M Take 1 tablet (20 mEq total) by mouth 2 (two) times daily. Take with furosemide   rifampin 300 MG capsule Commonly known as: RIFADIN Take 1 capsule (300 mg total) by mouth every 12 (twelve) hours.   spironolactone 25 MG tablet Commonly known as: ALDACTONE Take 0.5 tablets (12.5 mg total) by mouth daily.   tamsulosin 0.4 MG Caps capsule Commonly known as: FLOMAX Take 0.4 mg by  mouth daily after breakfast.   timolol 0.5 % ophthalmic solution Commonly known as: TIMOPTIC Place 1 drop into both eyes in the morning.   tiZANidine 4 MG tablet Commonly known as: ZANAFLEX Take 4 mg by mouth at bedtime.   vitamin B-12 500 MCG tablet Commonly known as: CYANOCOBALAMIN Take 500 mcg by mouth daily.   vitamin E 180 MG (400 UNITS) capsule Take 400 Units by mouth daily.       Allergies  Allergen Reactions   Jardiance [Empagliflozin]     Rash all over and lips turn purple.    Metformin And Related Diarrhea   Trazodone Other (See Comments)    Extreme hallucinations/ psychosis    Torsemide Rash    "Skin reaction"    Follow-up Information  Minna Merritts, MD Follow up on 11/26/2021.   Specialty: Cardiology Why: @ 9:20am Contact information: [place] [address] [phone]                  The results of significant diagnostics from this hospitalization (including imaging, microbiology, ancillary and laboratory) are listed below for reference.    Significant Diagnostic Studies: DG Chest 2 View  Result Date: 11/15/2021 CLINICAL DATA:  Shortness of breath. EXAM: CHEST - 2 VIEW COMPARISON:  08/16/2021 FINDINGS: Moderate-sized right pleural effusion, significantly increased. Associated mild right basilar atelectasis. These changes are obscuring the right heart borders with no gross change in size of the cardiac silhouette. Mild increase in size of a small left pleural effusion. Minimal left basilar atelectasis. Mildly tortuous and calcified thoracic aorta. Mild dextroconvex thoracic scoliosis and mild thoracic spine degenerative changes. IMPRESSION: 1. Moderate-sized right pleural effusion, significantly increased. 2. Mild increase in size of a small left pleural effusion. 3. Mild bibasilar atelectasis. Electronically Signed   By: Claudie Revering M.D.   On: 11/15/2021 17:27   DG Chest Port 1 View  Result Date:  11/18/2021 CLINICAL DATA:  Pleural effusion EXAM: PORTABLE CHEST 1 VIEW COMPARISON:  November 15, 2021 FINDINGS: Evaluation is limited secondary to patient positioning. The cardiomediastinal silhouette is unchanged and enlarged in contour. Small RIGHT pleural effusion that is grossly similar in comparison to prior. No pneumothorax. Bibasilar airspace opacities, similar incomparison to prior. Visualized abdomen is unremarkable. IMPRESSION: Stable chest radiograph. Electronically Signed   By: Valentino Saxon M.D.   On: 11/18/2021 14:58   DG Abd Portable 1V  Result Date: 11/17/2021 CLINICAL DATA:  Pain/swelling EXAM: PORTABLE ABDOMEN - 1 VIEW COMPARISON:  None. FINDINGS: Nonobstructive bowel gas pattern. Surgical clips in the right mid abdomen. Degenerative changes of the lumbar spine. IMPRESSION: Negative. Electronically Signed   By: Julian Hy M.D.   On: 11/17/2021 20:08   ECHOCARDIOGRAM LIMITED  Result Date: 11/16/2021    ECHOCARDIOGRAM LIMITED REPORT   Patient Name:   Henry Lindsey Date of Exam: 11/16/2021 Medical Rec #:  [id number]     Height:       71.0 in Accession #:    [id number]    Weight:       198.2 lb Date of Birth:  May 10, 1933     BSA:          2.101 m Patient Age:    88 years      BP:           129/55 mmHg Patient Gender: M             HR:           74 bpm. Exam Location:  [facility] Procedure: Limited Echo, Limited Color Doppler, Cardiac Doppler and Intracardiac            Opacification Agent Indications:     I50.21 congestive heart failure-Acute Systolic  History:         Patient has prior history of Echocardiogram examinations, most                  recent 08/10/2021. HFrEF and CHF, CAD, COPD, Stroke and PVD;                  Risk Factors:Diabetes.  Sonographer:     Charmayne Sheer Referring Phys:  [id number] [place] Diagnosing Phys: Ida Rogue MD  Sonographer Comments: Suboptimal subcostal window. IMPRESSIONS  1. Left  ventricular ejection fraction, by estimation, is 30 to 35%. The left  ventricle has moderately decreased function. The left ventricle demonstrates global hypokinesis. The left ventricular internal cavity size was moderately dilated.  2. Right ventricular systolic function is moderately reduced. The right ventricular size is moderately enlarged. Tricuspid regurgitation signal is inadequate for assessing PA pressure.  3. Left atrial size was moderately dilated.  4. The mitral valve is normal in structure. No evidence of mitral valve regurgitation. No evidence of mitral stenosis.  5. The aortic valve is normal in structure. Aortic valve regurgitation is not visualized. No aortic stenosis is present.  6. The inferior vena cava is dilated in size with <50% respiratory variability, suggesting right atrial pressure of 15 mmHg. FINDINGS  Left Ventricle: Left ventricular ejection fraction, by estimation, is 30 to 35%. The left ventricle has moderately decreased function. The left ventricle demonstrates global hypokinesis. Definity contrast agent was given IV to delineate the left ventricular endocardial borders. The left ventricular internal cavity size was moderately dilated. There is no left ventricular hypertrophy. Right Ventricle: The right ventricular size is moderately enlarged. No increase in right ventricular wall thickness. Right ventricular systolic function is moderately reduced. Tricuspid regurgitation signal is inadequate for assessing PA pressure. Left Atrium: Left atrial size was moderately dilated. Right Atrium: Right atrial size was normal in size. Pericardium: There is no evidence of pericardial effusion. Mitral Valve: The mitral valve is normal in structure. Mild mitral annular calcification. No evidence of mitral valve stenosis. Tricuspid Valve: The tricuspid valve is normal in structure. Tricuspid valve regurgitation is trivial. No evidence of tricuspid stenosis. Aortic Valve: The aortic valve is normal in structure. Aortic valve regurgitation is not visualized. No aortic  stenosis is present. Pulmonic Valve: The pulmonic valve was normal in structure. Pulmonic valve regurgitation is not visualized. No evidence of pulmonic stenosis. Aorta: The aortic root is normal in size and structure. Venous: The inferior vena cava is dilated in size with less than 50% respiratory variability, suggesting right atrial pressure of 15 mmHg. IAS/Shunts: No atrial level shunt detected by color flow Doppler. LEFT VENTRICLE PLAX 2D LVIDd:         5.70 cm LVIDs:         4.52 cm LV PW:         1.12 cm LV IVS:        0.81 cm  LV Volumes (MOD) LV vol d, MOD A2C: 248.0 ml LV vol d, MOD A4C: 211.0 ml LV vol s, MOD A2C: 151.0 ml LV vol s, MOD A4C: 127.0 ml LV SV MOD A2C:     97.0 ml LV SV MOD A4C:     211.0 ml LV SV MOD BP:      90.8 ml LEFT ATRIUM         Index LA diam:    5.30 cm 2.52 cm/m Ida Rogue MD Electronically signed by Ida Rogue MD Signature Date/Time: 11/16/2021/5:36:13 PM    Final     Microbiology: Recent Results (from the past 240 hour(s))  Resp Panel by RT-PCR (Flu A&B, Covid) Nasopharyngeal Swab     Status: None   Collection Time: 11/15/21  6:09 PM   Specimen: Nasopharyngeal Swab; Nasopharyngeal(NP) swabs in vial transport medium  Result Value Ref Range Status   SARS Coronavirus 2 by RT PCR NEGATIVE NEGATIVE Final    Comment: (NOTE) SARS-CoV-2 target nucleic acids are NOT DETECTED.  The SARS-CoV-2 RNA is generally detectable in upper respiratory specimens during the acute phase of  infection. The lowest concentration of SARS-CoV-2 viral copies this assay can detect is 138 copies/mL. A negative result does not preclude SARS-Cov-2 infection and should not be used as the sole basis for treatment or other patient management decisions. A negative result may occur with  improper specimen collection/handling, submission of specimen other than nasopharyngeal swab, presence of viral mutation(s) within the areas targeted by this assay, and inadequate number of viral copies(<138  copies/mL). A negative result must be combined with clinical observations, patient history, and epidemiological information. The expected result is Negative.  Fact Sheet for Patients:  EntrepreneurPulse.com.au  Fact Sheet for Healthcare Providers:  IncredibleEmployment.be  This test is no t yet approved or cleared by the [place] FDA and  has been authorized for detection and/or diagnosis of SARS-CoV-2 by FDA under an Emergency Use Authorization (EUA). This EUA will remain  in effect (meaning this test can be used) for the duration of the COVID-19 declaration under Section 564(b)(1) of the Act, 21 U.S.C.section 360bbb-3(b)(1), unless the authorization is terminated  or revoked sooner.       Influenza A by PCR NEGATIVE NEGATIVE Final   Influenza B by PCR NEGATIVE NEGATIVE Final    Comment: (NOTE) The Xpert Xpress SARS-CoV-2/FLU/RSV plus assay is intended as an aid in the diagnosis of influenza from Nasopharyngeal swab specimens and should not be used as a sole basis for treatment. Nasal washings and aspirates are unacceptable for Xpert Xpress SARS-CoV-2/FLU/RSV testing.  Fact Sheet for Patients: EntrepreneurPulse.com.au  Fact Sheet for Healthcare Providers: IncredibleEmployment.be  This test is not yet approved or cleared by the [place] FDA and has been authorized for detection and/or diagnosis of SARS-CoV-2 by FDA under an Emergency Use Authorization (EUA). This EUA will remain in effect (meaning this test can be used) for the duration of the COVID-19 declaration under Section 564(b)(1) of the Act, 21 U.S.C. section 360bbb-3(b)(1), unless the authorization is terminated or revoked.  Performed at [facility], [address] [place], [place] [address]   Body fluid culture w Gram Stain     Status: None (Preliminary result)   Collection Time: 11/18/21  1:57 PM   Specimen: Pleura;  Body Fluid  Result Value Ref Range Status   Specimen Description   Final    PLEURAL Performed at [facility], [address], [place], [place] [address]    Special Requests   Final    PLEURAL Performed at [facility], [address] [place], [place] [address]    Gram Stain   Final    WBC PRESENT,BOTH PMN AND MONONUCLEAR NO ORGANISMS SEEN CYTOSPIN SMEAR    Culture   Final    NO GROWTH 3 DAYS Performed at [facility], [place] [address], [place], [place] [address]    Report Status PENDING  Incomplete     Labs: Basic Metabolic Panel: Recent Labs  Lab 11/15/21 1647 11/16/21 0756 11/17/21 0413 11/17/21 2021 11/18/21 0529 11/19/21 0446 11/20/21 0816 11/21/21 0452  NA 124*   < > 128* 125* 129* 130* 129* 129*  K 3.5   < > 4.3 4.5 4.0 4.6 4.6 4.6  CL 85*   < > 90* 86* 90* 91* 92* 91*  CO2 28   < > _0 GLUCOSE 138*   < > 120* 162* 116* 114* 111* 100*  BUN 54*   < > 46* 42* 37* 29* 28* 32*  CREATININE 0.94   < > 0.99 0.94 0.90  0.88 0.86 1.07  CALCIUM 9.1   < > 8.5* 9.1 8.8* 9.1 8.7* 8.8*  MG 2.0  --   --  1.9  --  2.0  --   --   PHOS  --   --  3.1  --   --   --   --   --    < > = values in this interval not displayed.   Liver Function Tests: Recent Labs  Lab 11/16/21 0756 11/17/21 0413 11/17/21 2021 11/18/21 0529 11/19/21 0446  AST 34 31 35 32 34  ALT _0 ALKPHOS 95 87 100 102 108  BILITOT 1.8* 2.0* 2.3* 1.9* 2.9*  PROT 7.0 6.7 7.6 7.4 7.5  ALBUMIN 2.9* 2.8* 3.2* 3.1* 3.1*   No results for input(s): LIPASE, AMYLASE in the last 168 hours. No results for input(s): AMMONIA in the last 168 hours. CBC: Recent Labs  Lab 11/17/21 0413 11/17/21 2021 11/18/21 0529 11/19/21 0446 11/20/21 0816  WBC 4.9 5.5 4.5 6.0 6.0  NEUTROABS  --   --   --  3.5  --   HGB 9.5* 10.7* 9.8* 9.9* 9.7*  HCT 32.2* 35.9* 33.1* 34.0* 33.3*  MCV 80.1 80.1 78.1* 80.8 79.5*  PLT 128* 151 127* 130* 132*   Cardiac Enzymes: No results for  input(s): CKTOTAL, CKMB, CKMBINDEX, TROPONINI in the last 168 hours. BNP: BNP (last 3 results) Recent Labs    10/19/21 1226 11/15/21 1304 11/15/21 1647  BNP 2,372.6* 2,665.8* 2,065.7*    ProBNP (last 3 results) No results for input(s): PROBNP in the last 8760 hours.  CBG: Recent Labs  Lab 11/20/21 1148 11/20/21 1633 11/20/21 2153 11/21/21 0713 11/21/21 1124  GLUCAP 191* 121* 119* 111* 177*       Signed:  Desma Maxim MD.  [facility] 11/21/2021, 2:03 PM

## 2021-11-22 LAB — BODY FLUID CULTURE W GRAM STAIN: Culture: NO GROWTH

## 2021-11-25 NOTE — Progress Notes (Signed)
Cardiology Office Note  Date:  11/26/2021   ID:  [name], [name] Jan 13, 1933, MRN [id number]  PCP:  Baxter Hire, MD   Chief Complaint  Patient presents with   [facility] follow up     Patient c/o LE edema & shortness of breath with little to no exertion.     HPI:  Henry Lindsey is a 88 y.o. male with a history of  hyperlipidemia,  peripheral vascular disease with  angioplasty of the distal right SFA by Dr.  Lucky Cowboy,  50% carotid disease bilaterally, history of poorly controlled diabetes,  history of stroke with minimal residual left sided deficits,  CT scan 10/05/2016 Moderate diffuse aortic atherosclerosis Moderate diffuse three-vessel coronary disease Prior smoker, stopped in 1969 Presents for f/u of his CAD, CHF  Last seen in clinic by myself April 2022 Long discussion with him today, Daughter on the phone to discuss medications Recent hospitalization records reviewed for CHF  Echo 08/10/21  1. Left ventricular ejection fraction, by estimation, is 25 to 30%. The  left ventricle has severely decreased function. The left ventricle  demonstrates global hypokinesis with severe hypokinesis of the anterior,  septal, and apical region. Inferior wall  best preserved. Left ventricular diastolic parameters are consistent with  Grade II diastolic dysfunction (pseudonormalization). The average left  ventricular global longitudinal strain is -6.3 %. The global longitudinal  strain is abnormal.   2. Right ventricular systolic function is moderately reduced. The right  ventricular size is moderately enlarged. There is mildly elevated  pulmonary artery systolic pressure. The estimated right ventricular  systolic pressure is 63.0 mmHg.  Moderate to severe mitral valve regurgitation.   Cardiac cath 08/15/21 Successful Impella supported high risk PCI of the ostial LAD with atherectomy and drug-eluting stent placement.  The stent extended 1 to 2 mm into the left main coronary artery to ensure  coverage of the ostium.  There was normal flow into the  left circumflex. Moderately elevated left ventricular end-diastolic pressure at 24 mmHg at the beginning of the case.   Dual antiplatelet therapy for at least 12 months and preferably longer.  admitted from our office on 11/15/21 with acute on chronic combined CHF and electrolyte derangements after failing outpatient diuresis.  weight was up 22 pounds compared to September 2022 Status post right-sided thoracentesis 1/29 with 1.7 L of fluid removed Frequent PVCs Discharge November 21, 2021 Lasix 40 twice daily metolazone twice a week  Echo 11/16/21  1. Left ventricular ejection fraction, by estimation, is 30 to 35%. The  left ventricle has moderately decreased function. The left ventricle  demonstrates global hypokinesis. The left ventricular internal cavity size  was moderately dilated.   2. Right ventricular systolic function is moderately reduced. The right  ventricular size is moderately enlarged. Tricuspid regurgitation signal is  inadequate for assessing PA pressure.   3. Left atrial size was moderately dilated.   4. The mitral valve is normal in structure. No evidence of mitral valve  regurgitation. No evidence of mitral stenosis.   5. The aortic valve is normal in structure. Aortic valve regurgitation is  not visualized. No aortic stenosis is present.   6. The inferior vena cava is dilated in size with <50% respiratory  variability, suggesting right atrial pressure of 15 mmHg.  Weight today 184 Weight at time of discharge 1 week ago 179.5 Daughter has not been giving metolazone, concerned there were too many pills Wonders whether to take the full metoprolol succinate 50 Remains on amiodarone  200 twice daily Feels better leg swelling  Sedentary  EKG personally reviewed by myself on todays visit Normal sinus rhythm rate 66 bpm old anterior MI, PVC  Past medical history reviewed   Cardiac cath 11/24/2020 Severe  three-vessel disease Occluded RCA Severe ostial LAD disease, heavily calcified Moderate to severe proximal LAD and OM disease Reduced ejection fraction 35% based on prior echocardiogram  Right heart pressures mildly elevated, elevated EDP  Seen by CT surgery/Dr. Cyndia Bent Medical management recommended   hospital admission for sepsis, cholecystitis, abdominal pain Gall bladder surgery scheduled for 11/13/2016 at [facility]  History of claudication type symptoms have improved particularly on the right side after angioplasty .  Previous ultrasound showed bilateral tibial occlusive disease, residual 50% left SFA stenosis .    He does have a history of a right thalamic lacunar infarct in February 2012.   PMH:   has a past medical history of Anginal pain ([place]), Aortic atherosclerosis ([place]), Arthritis, BPH (benign prostatic hyperplasia), CAD (coronary artery disease), Carotid artery stenosis, CHF (congestive heart failure) ([place]), COPD (chronic obstructive pulmonary disease) ([place]), CVA (cerebral infarction) (11/2010), GERD (gastroesophageal reflux disease), Heart murmur, HFrEF (heart failure with reduced ejection fraction) ([place]), Hyperlipidemia, Hypertension, Ischemic cardiomyopathy, Lower extremity cellulitis, NSTEMI (non-ST elevated myocardial infarction) ([place]) (08/09/2021), Peripheral vascular disease in diabetes mellitus ([place]), Pneumonia, and Type 2 diabetes mellitus treated with insulin ([place]).  PSH:    Past Surgical History:  Procedure Laterality Date   ABDOMINAL AORTOGRAM N/A 08/14/2021   Procedure: ABDOMINAL AORTOGRAM;  Surgeon: Nelva Bush, MD;  Location: [place] [facility];  Service: Cardiovascular;  Laterality: N/A;   CHOLECYSTECTOMY N/A 11/13/2016   Procedure: LAPAROSCOPIC CHOLECYSTECTOMY WITH INTRAOPERATIVE CHOLANGIOGRAM;  Surgeon: Olean Ree, MD;  Location: [facility];  Service: General;  Laterality: N/A;   COLONOSCOPY WITH PROPOFOL N/A 02/10/2018   Procedure: COLONOSCOPY WITH  PROPOFOL;  Surgeon: Lucilla Lame, MD;  Location: [facility] ENDOSCOPY;  Service: Endoscopy;  Laterality: N/A;   CORONARY ATHERECTOMY N/A 08/15/2021   Procedure: CORONARY ATHERECTOMY;  Surgeon: Wellington [place], MD;  Location: [place] [facility];  Service: Cardiovascular;  Laterality: N/A;   CORONARY STENT INTERVENTION W/IMPELLA N/A 08/15/2021   Procedure: Coronary Stent Intervention w/Impella;  Surgeon: Wellington Hampshire, MD;  Location: [place] [facility];  Service: Cardiovascular;  Laterality: N/A;   ERCP N/A 11/17/2016   Procedure: ENDOSCOPIC RETROGRADE CHOLANGIOPANCREATOGRAPHY (ERCP);  Surgeon: Irene Shipper, MD;  Location: [facility];  Service: Endoscopy;  Laterality: N/A;   ERCP N/A 02/04/2017   Procedure: ENDOSCOPIC RETROGRADE CHOLANGIOPANCREATOGRAPHY (ERCP) Stent removal;  Surgeon: Lucilla Lame, MD;  Location: [facility] ENDOSCOPY;  Service: Endoscopy;  Laterality: N/A;   IR GENERIC HISTORICAL  10/06/2016   IR PERC CHOLECYSTOSTOMY 10/06/2016 Aletta Edouard, MD [facility]   JOINT REPLACEMENT Left 2014   left knee   RIGHT/LEFT HEART CATH AND CORONARY ANGIOGRAPHY N/A 11/24/2020   Procedure: RIGHT/LEFT HEART CATH AND CORONARY ANGIOGRAPHY;  Surgeon: Minna Merritts, MD;  Location: [place] [facility];  Service: Cardiovascular;  Laterality: N/A;   RIGHT/LEFT HEART CATH AND CORONARY ANGIOGRAPHY N/A 08/14/2021   Procedure: RIGHT/LEFT HEART CATH AND CORONARY ANGIOGRAPHY;  Surgeon: Nelva Bush, MD;  Location: [place] [facility];  Service: Cardiovascular;  Laterality: N/A;   TOTAL KNEE ARTHROPLASTY Right 08/08/2020   Procedure: TOTAL KNEE ARTHROPLASTY;  Surgeon: Lovell Sheehan, MD;  Location: [facility];  Service: Orthopedics;  Laterality: Right;   TOTAL KNEE REVISION Right 10/01/2021   Procedure: IRRIGATION AND DEBRIDMENT RIGHT KNEE WITH POLY EXCHANGE;  Surgeon: Lovell Sheehan, MD;  Location: [facility] OR[facility];  Service: Orthopedics;  Laterality: Right;   UPPER GASTROINTESTINAL ENDOSCOPY     WRIST SURGERY       Current Outpatient Medications  Medication Sig Dispense Refill   albuterol (VENTOLIN HFA) 108 (90 Base) MCG/ACT inhaler Inhale 2 puffs into the lungs every 6 (six) hours as needed for shortness of breath or wheezing.     amiodarone (PACERONE) 200 MG tablet Take 1 tablet (200 mg total) by mouth 2 (two) times daily. 60 tablet 1   ascorbic acid (VITAMIN C) 500 MG tablet Take 500 mg by mouth daily.      aspirin EC 81 MG tablet Take 1 tablet (81 mg total) by mouth daily. Swallow whole. 90 tablet 3   atorvastatin (LIPITOR) 40 MG tablet Take 1 tablet (40 mg total) by mouth daily. 90 tablet 3   Blood Glucose Monitoring Suppl (ONE TOUCH ULTRA SYSTEM KIT) w/Device KIT 1 kit by Does not apply route once. Use DX code E11.59 1 each 0   cephALEXin (KEFLEX) 500 MG capsule Take 1 capsule (500 mg total) by mouth every 8 (eight) hours. 90 capsule 1   cholecalciferol (VITAMIN D) 25 MCG (1000 UNIT) tablet Take 1,000 Units by mouth daily.     clopidogrel (PLAVIX) 75 MG tablet TAKE 1 TABLET(75 MG) BY MOUTH DAILY WITH BREAKFAST 30 tablet 4   cyclobenzaprine (FLEXERIL) 5 MG tablet Take 5-10 mg by mouth daily as needed for muscle spasms.     diclofenac Sodium (VOLTAREN) 1 % GEL Apply 2 g topically 2 (two) times daily as needed (pain).     docusate sodium (COLACE) 100 MG capsule Take 1 capsule (100 mg total) by mouth 2 (two) times daily. 30 capsule 0   furosemide (LASIX) 40 MG tablet Take 1 tablet (40 mg total) by mouth 2 (two) times daily. 60 tablet 5   gabapentin (NEURONTIN) 400 MG capsule Take 400 mg by mouth 2 (two) times daily.     glipiZIDE (GLUCOTROL) 10 MG tablet Take 10 mg by mouth daily before breakfast.     HYDROcodone-acetaminophen (NORCO/VICODIN) 5-325 MG tablet Take 1 tablet by mouth every 4 (four) hours as needed for moderate pain (pain score 4-6). 30 tablet 0   INS SYRINGE/NEEDLE 1CC/28G (B-D INSULIN SYRINGE 1CC/28G) 28G X 1/2" 1 ML MISC USE TO ADMINISTER INSULIN DAILY 100 each 0   insulin aspart  protamine- aspart (NOVOLOG MIX 70/30) (70-30) 100 UNIT/ML injection Inject 0.15 mLs (15 Units total) into the skin 2 (two) times daily with a meal. 10 mL 11   latanoprost (XALATAN) 0.005 % ophthalmic solution Place 1 drop into both eyes at bedtime.  5   losartan (COZAAR) 25 MG tablet Take 0.5 tablets (12.5 mg total) by mouth daily. 30 tablet 5   metolazone (ZAROXOLYN) 2.5 MG tablet Take 1 tablet (2.5 mg total) by mouth 2 (two) times a week. Take this 1/2 hour before furosemide 8 tablet 5   Multiple Vitamin (MULTIVITAMIN WITH MINERALS) TABS tablet Take 1 tablet by mouth in the morning.     nitroGLYCERIN (NITROSTAT) 0.4 MG SL tablet Place 1 tablet (0.4 mg total) under the tongue every 5 (five) minutes as needed for chest pain. MAXIMUM 3 TABLETS 50 tablet 3   ONE TOUCH ULTRA TEST test strip USE 1 STRIP TO TEST THREE TIMES DAILY AS DIRECTED 100 each 0   ONETOUCH DELICA LANCETS 01U MISC Use three times daily to check blood sugar. 100 each 11   oxyCODONE (  OXY IR/ROXICODONE) 5 MG immediate release tablet Take 5 mg by mouth every 4 (four) hours as needed for severe pain.     pantoprazole (PROTONIX) 40 MG tablet Take 40 mg by mouth daily.     potassium chloride SA (KLOR-CON M) 20 MEQ tablet Take 1 tablet (20 mEq total) by mouth 2 (two) times daily. Take with furosemide 60 tablet 5   rifampin (RIFADIN) 300 MG capsule Take 1 capsule (300 mg total) by mouth every 12 (twelve) hours. 60 capsule 1   spironolactone (ALDACTONE) 25 MG tablet Take 0.5 tablets (12.5 mg total) by mouth daily. 15 tablet 11   tamsulosin (FLOMAX) 0.4 MG CAPS capsule Take 0.4 mg by mouth daily after breakfast.     timolol (TIMOPTIC) 0.5 % ophthalmic solution Place 1 drop into both eyes in the morning.  2   tiZANidine (ZANAFLEX) 4 MG tablet Take 4 mg by mouth at bedtime.     vitamin B-12 (CYANOCOBALAMIN) 500 MCG tablet Take 500 mcg by mouth daily.     vitamin E 180 MG (400 UNITS) capsule Take 400 Units by mouth daily.     No current  facility-administered medications for this visit.    Allergies:   Jardiance [empagliflozin], Metformin and related, Trazodone, and Torsemide   Social History:  The patient  reports that he quit smoking about 53 years ago. His smoking use included cigarettes. He has a 16.50 pack-year smoking history. He has never used smokeless tobacco. He reports that he does not drink alcohol and does not use drugs.   Family History:   family history includes Diabetes in his mother.    Review of Systems: Review of Systems  Constitutional: Negative.   HENT: Negative.    Respiratory:  Positive for shortness of breath.   Cardiovascular:  Positive for leg swelling.  Gastrointestinal: Negative.   Musculoskeletal:  Positive for joint pain.  Neurological: Negative.   Psychiatric/Behavioral: Negative.    All other systems reviewed and are negative.  PHYSICAL EXAM: BP (!) 90/54 (BP Location: Left Arm, Patient Position: Sitting, Cuff Size: Normal)    Pulse 66    Ht _0  (1.803 m)    Wt 183 lb (83 kg)    BMI 25.52 kg/m  Constitutional:  oriented to person, place, and time. No distress.  HENT:  Head: Grossly normal Eyes:  no discharge. No scleral icterus.  Neck: No JVD, no carotid bruits  Cardiovascular: Regular rate and rhythm, no murmurs appreciated Trace pitting lower extremity edema Pulmonary/Chest: Clear to auscultation bilaterally, no wheezes or rails Abdominal: Soft.  Mild abdominal distention Musculoskeletal: Normal range of motion Neurological:  normal muscle tone. Coordination normal. No atrophy Skin: Skin warm and dry Psychiatric: normal affect, pleasant   Recent Labs: 11/15/2021: B Natriuretic Peptide 2,065.7 11/19/2021: ALT 11; Magnesium 2.0 11/20/2021: Hemoglobin 9.7; Platelets 132 11/21/2021: BUN 32; Creatinine, Ser 1.07; Potassium 4.6; Sodium 129    Lipid Panel Lab Results  Component Value Date   CHOL 53 08/10/2021   HDL 13 (L) 08/10/2021   LDLCALC 29 08/10/2021   TRIG 54  08/10/2021      Wt Readings from Last 3 Encounters:  11/26/21 183 lb (83 kg)  11/21/21 186 lb 11.7 oz (84.7 kg)  11/15/21 198 lb 4 oz (89.9 kg)     ASSESSMENT AND PLAN:  Type 2 diabetes, uncontrolled, with peripheral circulatory disorder (HCC) Prior history of poorly controlled diabetes, A1c up to 10 Most recent A1c 7.0 Managed by primary care  PAD (peripheral artery  disease) ([place]) Diffuse aortic atherosclerosis noted, moderate degree Severe carotid disease, followed by vascular Cholesterol at goal, on antiplatelet therapy  Carotid artery stenosis with cerebral infarction [facility]) Stressed importance of aggressive diabetes control, lipid control  Coronary artery disease involving native coronary artery of native heart without angina pectoris Underwent high risk stenting LAD Aspirin Plavix Denies anginal symptoms  chronic systolic CHF Previously on torsemide, appears he has been changed to Lasix 40 twice daily after recent hospitalization Also was given metolazone 2.5 twice a week, daughter has not been putting this in the pillbox Discussed today, given weight up over 4 pounds since discharge in the past week with leg swelling, recommended metolazone 2.5 twice a day Would also recommend metolazone 2.5 mg for weight 185 or higher Stay on Lasix 40 twice daily with potassium twice daily Blood pressure low we will decrease metoprolol succinate down to 25 daily  Frequent PVCs Noted in the hospital, was started on amiodarone, we will decrease amiodarone down to 200 daily   Total encounter time more than 40 minutes  Greater than 50% was spent in counseling and coordination of care with the patient   Orders Placed This Encounter  Procedures   EKG 12-Lead     Signed, Esmond Plants, M.D., Ph.D. 11/26/2021  [place], [place]

## 2021-11-26 ENCOUNTER — Encounter: Payer: Self-pay | Admitting: Cardiovascular Disease

## 2021-11-26 ENCOUNTER — Other Ambulatory Visit: Payer: Self-pay

## 2021-11-26 ENCOUNTER — Ambulatory Visit: Payer: Medicare Other | Admitting: Family

## 2021-11-26 ENCOUNTER — Ambulatory Visit (INDEPENDENT_AMBULATORY_CARE_PROVIDER_SITE_OTHER): Payer: Medicare Other | Admitting: Cardiovascular Disease

## 2021-11-26 VITALS — BP 90/54 | HR 66 | Ht 71.0 in | Wt 183.0 lb

## 2021-11-26 DIAGNOSIS — I272 Pulmonary hypertension, unspecified: Secondary | ICD-10-CM | POA: Diagnosis not present

## 2021-11-26 DIAGNOSIS — I5023 Acute on chronic systolic (congestive) heart failure: Secondary | ICD-10-CM

## 2021-11-26 DIAGNOSIS — E785 Hyperlipidemia, unspecified: Secondary | ICD-10-CM

## 2021-11-26 DIAGNOSIS — I2510 Atherosclerotic heart disease of native coronary artery without angina pectoris: Secondary | ICD-10-CM

## 2021-11-26 DIAGNOSIS — I255 Ischemic cardiomyopathy: Secondary | ICD-10-CM | POA: Diagnosis not present

## 2021-11-26 DIAGNOSIS — I739 Peripheral vascular disease, unspecified: Secondary | ICD-10-CM

## 2021-11-26 DIAGNOSIS — I779 Disorder of arteries and arterioles, unspecified: Secondary | ICD-10-CM

## 2021-11-26 DIAGNOSIS — I10 Essential (primary) hypertension: Secondary | ICD-10-CM

## 2021-11-26 DIAGNOSIS — D649 Anemia, unspecified: Secondary | ICD-10-CM

## 2021-11-26 MED ORDER — METOPROLOL SUCCINATE ER 25 MG PO TB24: 25.0000 mg | ORAL_TABLET | Freq: Every day | ORAL | 3 refills | Status: DC

## 2021-11-26 MED ORDER — AMIODARONE HCL 200 MG PO TABS: 200.0000 mg | ORAL_TABLET | Freq: Every day | ORAL | 3 refills | Status: DC

## 2021-11-26 MED ORDER — METOLAZONE 2.5 MG PO TABS: 2.5000 mg | ORAL_TABLET | ORAL | 3 refills | Status: DC

## 2021-11-26 NOTE — Patient Instructions (Addendum)
Goal weight 179 to 180 pounds  Medication Instructions:  Please Continue metolazone 2.5 mg twice a week Take 30 min before Am lasix/furosemide Also take metolazone 2.5 mg in the AM for weight 185 or higher  Please decrease the metoprolol succinate down to 25 mg daily  Please decrease the amiodarone down to 200 mg once a day  If you need a refill on your cardiac medications before your next appointment, please call your pharmacy.   Lab work: No new labs needed  Testing/Procedures: No new testing needed  Follow-Up: At [facility], you and your health needs are our priority.  As part of our continuing mission to provide you with exceptional heart care, we have created designated Provider Care Teams.  These Care Teams include your primary Cardiologist (physician) and Advanced Practice Providers (APPs -  Physician Assistants and Nurse Practitioners) who all work together to provide you with the care you need, when you need it.  You will need a follow up appointment in 3 months , APP ok  Providers on your designated Care Team:   Murray Hodgkins, NP Christell Faith, PA-C Cadence Kathlen Mody, [place]  COVID-19 Vaccine Information can be found at: [email or web address] For questions related to vaccine distribution or appointments, please email [email or web address] or call [phone].

## 2021-12-04 ENCOUNTER — Ambulatory Visit: Payer: Medicare Other | Attending: Infectious Diseases | Admitting: Infectious Diseases

## 2021-12-04 ENCOUNTER — Other Ambulatory Visit: Payer: Self-pay

## 2021-12-04 ENCOUNTER — Encounter: Payer: Self-pay | Admitting: Infectious Diseases

## 2021-12-04 VITALS — BP 117/69 | HR 80 | Temp 97.6°F | Wt 178.0 lb

## 2021-12-04 DIAGNOSIS — I739 Peripheral vascular disease, unspecified: Secondary | ICD-10-CM | POA: Diagnosis not present

## 2021-12-04 DIAGNOSIS — Z792 Long term (current) use of antibiotics: Secondary | ICD-10-CM | POA: Diagnosis not present

## 2021-12-04 DIAGNOSIS — T8453XD Infection and inflammatory reaction due to internal right knee prosthesis, subsequent encounter: Secondary | ICD-10-CM

## 2021-12-04 DIAGNOSIS — T8450XD Infection and inflammatory reaction due to unspecified internal joint prosthesis, subsequent encounter: Secondary | ICD-10-CM

## 2021-12-04 DIAGNOSIS — Z955 Presence of coronary angioplasty implant and graft: Secondary | ICD-10-CM | POA: Insufficient documentation

## 2021-12-04 DIAGNOSIS — Z8673 Personal history of transient ischemic attack (TIA), and cerebral infarction without residual deficits: Secondary | ICD-10-CM | POA: Diagnosis not present

## 2021-12-04 DIAGNOSIS — I5023 Acute on chronic systolic (congestive) heart failure: Secondary | ICD-10-CM | POA: Insufficient documentation

## 2021-12-04 DIAGNOSIS — Y838 Other surgical procedures as the cause of abnormal reaction of the patient, or of later complication, without mention of misadventure at the time of the procedure: Secondary | ICD-10-CM | POA: Diagnosis not present

## 2021-12-04 DIAGNOSIS — D649 Anemia, unspecified: Secondary | ICD-10-CM | POA: Diagnosis not present

## 2021-12-04 DIAGNOSIS — Z79899 Other long term (current) drug therapy: Secondary | ICD-10-CM | POA: Diagnosis not present

## 2021-12-04 DIAGNOSIS — I509 Heart failure, unspecified: Secondary | ICD-10-CM

## 2021-12-04 DIAGNOSIS — I2510 Atherosclerotic heart disease of native coronary artery without angina pectoris: Secondary | ICD-10-CM | POA: Insufficient documentation

## 2021-12-04 DIAGNOSIS — Z7902 Long term (current) use of antithrombotics/antiplatelets: Secondary | ICD-10-CM | POA: Insufficient documentation

## 2021-12-04 DIAGNOSIS — I252 Old myocardial infarction: Secondary | ICD-10-CM | POA: Insufficient documentation

## 2021-12-04 DIAGNOSIS — Z96651 Presence of right artificial knee joint: Secondary | ICD-10-CM | POA: Diagnosis not present

## 2021-12-04 DIAGNOSIS — T8453XA Infection and inflammatory reaction due to internal right knee prosthesis, initial encounter: Secondary | ICD-10-CM | POA: Insufficient documentation

## 2021-12-04 DIAGNOSIS — B9561 Methicillin susceptible Staphylococcus aureus infection as the cause of diseases classified elsewhere: Secondary | ICD-10-CM | POA: Insufficient documentation

## 2021-12-04 DIAGNOSIS — I878 Other specified disorders of veins: Secondary | ICD-10-CM | POA: Insufficient documentation

## 2021-12-04 NOTE — Progress Notes (Signed)
NAME: Henry Lindsey  DOB: 03-11-1933  MRN: [id number]  Date/Time: 12/04/2021 11:03 AM   Patient is here with his partner. Follow up for Rt knee prosthetic joint infection Henry Lindsey is a 88 y.o. with a history of  CAD status post stent placement, bilateral leg swelling, right prosthetic joint infection, peripheral arterial disease,  Patient underwent knee replacement April 2021.  Then in December 2021 patient started having shortness of breath and bronchitis-like feature and x-ray had shown bilateral pleural effusion which was getting worse with lower extremity edema.  He was given Lasix by his PCP and recommended echocardiogram.  Echo done on 11/14/2020 showed ejection fraction of 35% with moderate MR, mild TR and mild pericardial effusion. Then in February 2022 he underwent a cardiac cath and that showed 70% stenosis at the LAD and proximal RCA 100% stenosis and first diagonal 80% stenosis.  He was seen by cardiothoracic surgeon and [facility] and was recommended medical management. He was also followed at the wound clinic because of bilateral venous edema and venous stasis ulcers and weeping and scabbing wounds on the right leg.  During October 2022 he was hospitalized with worsening shortness of breath.  He was diagnosed with NSTEMI and acute on chronic systolic congestive heart failure and acute on chronic.  He underwent another cardiac cath which showed severe triple-vessel disease with LAD severe ostial disease.  Dr. Fletcher Anon took him to [facility] and did a successful Impella supported high risk PCI of the ostial LAD with atherectomy and drug-eluting stent placement on 08/15/21.  He was placed on dual antiplatelet therapy. Rt knee remained swollen and painful. He saw Dr. Harlow Mares on 09/21/2021 and the right knee was aspirated and a cortisone injection was given.  As the culture grew staph aureus patient was asked to get hospitalized and underwent irrigation and debridement right knee with polyexchange on  10/01/2021. He was discharged home on cefazolin 2 grams IV q8 and rifampin 334m PO bID until 11/12/21 for 6 weeks. He completed Iv cefazolin and now on PO Keflex along with rifampin He was recently hospitalized for fluid overload and had to have thoracentesis and a liter and a half fluid was drained.  Patient is doing better now He is asked to weigh himself every day.  He is on metolazone He is being followed by heart failure clinic Patient is 100% compliant with his medication. His daughter fills the Pillbox for him I [facility] as well. Past Medical History:  Diagnosis Date   [place]inal pain ([place]    Aortic atherosclerosis (HCC)    Arthritis    BPH (benign prostatic hyperplasia)    CAD (coronary artery disease)    a.) 2/22 Cath: LM 50d, LAD sev ost dz, mod-sev prox/mid dz. D1 sev dz. LCX mild prox dzs, OM2 mod-sev dz, RCA 100p. RPL and RPDA fill via L-R collats. EF 35%. Seen by CVTS->Med mgmt. b.) R/LHC 08/14/21: 30% m-d-LM, 85% oLAD, 50% pLAD, 35% dLAD, 80% D1, 50% pLCx, 70% OM1, 100% p-dRCA -> trans to [facility]. c.) 08/15/21 Impella supported HIGH RISK PCI with atherectomy. 2.75 x 291mOnyx Frontier DES oLAD.   Carotid artery stenosis    a. 11/2010 U/S: <50% bilaterally; b. 03/2021 40-59% bilat ICA stenoses.   CHF (congestive heart failure) (HCC)    COPD (chronic obstructive pulmonary disease) ([place]   noted CT 10/05/16 former smoker quit age 88  CVA (cerebral infarction) 11/2010   a.) RIGHT thalamic lacunar   GERD (gastroesophageal  reflux disease)    Heart murmur    HFrEF (heart failure with reduced ejection fraction) ([place])    a. 10/2020 Echo: EF 35%.   Hyperlipidemia    Hypertension    Ischemic cardiomyopathy    a. 10/2020 Echo: EF 35%. Nl RVSP. Mod MR. Mild TR; b. 11/2020 Cath:  CO/CI 5.05/2.49. LV gram: EF 35%.   Lower extremity cellulitis    NSTEMI (non-ST elevated myocardial infarction) ([place]) 08/09/2021   a.) R/LHC 08/14/2021: 30% m-d-LM, 85% oLAD, 50% pLAD, 35% dLAD,  80% D1, 50% pLCx, 70% OM1, 100% p-dRCA -> transfer to [facility]. b.) 08/15/2021 Impella supported HIGH RISK PCI with atherectomy. 2.75 x 23m Onyx Frontier DES to [employer]   Peripheral vascular disease in diabetes mellitus ([place]    a. 06/2012 95% occlusion s/p PTA R SFA (Dr. DLucky Cowboy; b. 03/2021 ABIs: R 1.02, L 0.96.   Pneumonia    Type 2 diabetes mellitus treated with insulin ([facility]     Past Surgical History:  Procedure Laterality Date   ABDOMINAL AORTOGRAM N/A 08/14/2021   Procedure: ABDOMINAL AORTOGRAM;  Surgeon: ENelva Bush MD;  [place] [facility] [place];  Service: Cardiovascular;  Laterality: N/A;   CHOLECYSTECTOMY N/A 11/13/2016   Procedure: LAPAROSCOPIC CHOLECYSTECTOMY WITH INTRAOPERATIVE CHOLANGIOGRAM;  Surgeon: JOlean Ree MD;  Location: [facility] ORS;  Service: General;  Laterality: N/A;   COLONOSCOPY WITH PROPOFOL N/A 02/10/2018   Procedure: COLONOSCOPY WITH PROPOFOL;  Surgeon: WLucilla Lame MD;  Location: [facility] ENDOSCOPY;  Service: Endoscopy;  Laterality: N/A;   CORONARY ATHERECTOMY N/A 08/15/2021   Procedure: CORONARY ATHERECTOMY;  Surgeon: AWellington Hampshire MD;  [place];  Service: Cardiovascular;  Laterality: N/A;   CORONARY STENT INTERVENTION W/IMPELLA N/A 08/15/2021   Procedure: Coronary Stent Intervention w/Impella;  Surgeon: AWellington Hampshire MD;  [place] [facility];  Service: Cardiovascular;  Laterality: N/A;   ERCP N/A 11/17/2016   Procedure: ENDOSCOPIC RETROGRADE CHOLANGIOPANCREATOGRAPHY (ERCP);  Surgeon: JIrene Shipper MD;  [facility];  Service: Endoscopy;  Laterality: N/A;   ERCP N/A 02/04/2017   Procedure: ENDOSCOPIC RETROGRADE CHOLANGIOPANCREATOGRAPHY (ERCP) Stent removal;  Surgeon: DLucilla Lame MD;  Location: [facility] ENDOSCOPY;  Service: Endoscopy;  Laterality: N/A;   IR GENERIC HISTORICAL  10/06/2016   IR PERC CHOLECYSTOSTOMY 10/06/2016 GAletta Edouard MD [facility]   JOINT REPLACEMENT Left 2014   left knee   RIGHT/LEFT HEART CATH  AND CORONARY ANGIOGRAPHY N/A 11/24/2020   Procedure: RIGHT/LEFT HEART CATH AND CORONARY ANGIOGRAPHY;  Surgeon: GMinna Merritts MD;  [place] [facility];  Service: Cardiovascular;  Laterality: N/A;   RIGHT/LEFT HEART CATH AND CORONARY ANGIOGRAPHY N/A 08/14/2021   Procedure: RIGHT/LEFT HEART CATH AND CORONARY ANGIOGRAPHY;  Surgeon: ENelva Bush MD;  [place];  Service: Cardiovascular;  Laterality: N/A;   TOTAL KNEE ARTHROPLASTY Right 08/08/2020   Procedure: TOTAL KNEE ARTHROPLASTY;  Surgeon: BLovell Sheehan MD;  Location: [facility] ORS;  Service: Orthopedics;  Laterality: Right;   TOTAL KNEE REVISION Right 10/01/2021   Procedure: IRRIGATION AND DEBRIDMENT RIGHT KNEE WITH POLY EXCHANGE;  Surgeon: BLovell Sheehan MD;  Location: [facility] ORS;  Service: Orthopedics;  Laterality: Right;   UPPER GASTROINTESTINAL ENDOSCOPY     WRIST SURGERY      Social History   Socioeconomic History   Marital status: Widowed    Spouse name: SEnid Derry deceased   Number of children: 2   Years of education: 181  Highest education level: Not on file  Occupational History  Employer: retired  Tobacco Use   Smoking status: Former    Packs/day: 0.50    Years: 33.00    Pack years: 16.50    Types: Cigarettes    Quit date: 04/21/1968    Years since quitting: 53.6   Smokeless tobacco: Never  Vaping Use   Vaping Use: Never used  Substance and Sexual Activity   Alcohol use: No   Drug use: No   Sexual activity: Not Currently    Partners: Female    Birth control/protection: None  Other Topics Concern   Not on file  Social History Narrative   Widowed in 2014; dating again , stays with girlfriend house   Social Determinants of Health   Financial Resource Strain: Not on file  Food Insecurity: Not on file  Transportation Needs: Not on file  Physical Activity: Not on file  Stress: Not on file  Social Connections: Not on file  Intimate Partner Violence: Not on file    Family  History  Problem Relation Age of Onset   Diabetes Mother    Allergies  Allergen Reactions   Jardiance [Empagliflozin]     Rash all over and lips turn purple.    Metformin And Related Diarrhea   Trazodone Other (See Comments)    Extreme hallucinations/ psychosis    Torsemide Rash    "Skin reaction"   I? Current Outpatient Medications  Medication Sig Dispense Refill   albuterol (VENTOLIN HFA) 108 (90 Base) MCG/ACT inhaler Inhale 2 puffs into the lungs every 6 (six) hours as needed for shortness of breath or wheezing.     amiodarone (PACERONE) 200 MG tablet Take 1 tablet (200 mg total) by mouth daily. 90 tablet 3   ascorbic acid (VITAMIN C) 500 MG tablet Take 500 mg by mouth daily.      aspirin EC 81 MG tablet Take 1 tablet (81 mg total) by mouth daily. Swallow whole. 90 tablet 3   atorvastatin (LIPITOR) 40 MG tablet Take 1 tablet (40 mg total) by mouth daily. 90 tablet 3   Blood Glucose Monitoring Suppl (ONE TOUCH ULTRA SYSTEM KIT) w/Device KIT 1 kit by Does not apply route once. Use DX code E11.59 1 each 0   cephALEXin (KEFLEX) 500 MG capsule Take 1 capsule (500 mg total) by mouth every 8 (eight) hours. 90 capsule 1   cholecalciferol (VITAMIN D) 25 MCG (1000 UNIT) tablet Take 1,000 Units by mouth daily.     clopidogrel (PLAVIX) 75 MG tablet TAKE 1 TABLET(75 MG) BY MOUTH DAILY WITH BREAKFAST 30 tablet 4   cyclobenzaprine (FLEXERIL) 5 MG tablet Take 5-10 mg by mouth daily as needed for muscle spasms.     diclofenac Sodium (VOLTAREN) 1 % GEL Apply 2 g topically 2 (two) times daily as needed (pain).     docusate sodium (COLACE) 100 MG capsule Take 1 capsule (100 mg total) by mouth 2 (two) times daily. 30 capsule 0   furosemide (LASIX) 40 MG tablet Take 1 tablet (40 mg total) by mouth 2 (two) times daily. 60 tablet 5   gabapentin (NEURONTIN) 400 MG capsule Take 400 mg by mouth 2 (two) times daily.     glipiZIDE (GLUCOTROL) 10 MG tablet Take 10 mg by mouth daily before breakfast.      HYDROcodone-acetaminophen (NORCO/VICODIN) 5-325 MG tablet Take 1 tablet by mouth every 4 (four) hours as needed for moderate pain (pain score 4-6). 30 tablet 0   INS SYRINGE/NEEDLE 1CC/28G (B-D INSULIN SYRINGE 1CC/28G) 28G X 1/2" 1 ML  MISC USE TO ADMINISTER INSULIN DAILY 100 each 0   insulin aspart protamine- aspart (NOVOLOG MIX 70/30) (70-30) 100 UNIT/ML injection Inject 0.15 mLs (15 Units total) into the skin 2 (two) times daily with a meal. 10 mL 11   latanoprost (XALATAN) 0.005 % ophthalmic solution Place 1 drop into both eyes at bedtime.  5   losartan (COZAAR) 25 MG tablet Take 0.5 tablets (12.5 mg total) by mouth daily. 30 tablet 5   metolazone (ZAROXOLYN) 2.5 MG tablet Take 1 tablet (2.5 mg total) by mouth 2 (two) times a week. Take this 1/2 hour before furosemide. Take on days for weight 185 or higher in the am. 30 tablet 3   metoprolol succinate (TOPROL XL) 25 MG 24 hr tablet Take 1 tablet (25 mg total) by mouth daily. 90 tablet 3   Multiple Vitamin (MULTIVITAMIN WITH MINERALS) TABS tablet Take 1 tablet by mouth in the morning.     nitroGLYCERIN (NITROSTAT) 0.4 MG SL tablet Place 1 tablet (0.4 mg total) under the tongue every 5 (five) minutes as needed for chest pain. MAXIMUM 3 TABLETS 50 tablet 3   ONE TOUCH ULTRA TEST test strip USE 1 STRIP TO TEST THREE TIMES DAILY AS DIRECTED 100 each 0   ONETOUCH DELICA LANCETS 38B MISC Use three times daily to check blood sugar. 100 each 11   oxyCODONE (OXY IR/ROXICODONE) 5 MG immediate release tablet Take 5 mg by mouth every 4 (four) hours as needed for severe pain.     pantoprazole (PROTONIX) 40 MG tablet Take 40 mg by mouth daily.     potassium chloride SA (KLOR-CON M) 20 MEQ tablet Take 1 tablet (20 mEq total) by mouth 2 (two) times daily. Take with furosemide 60 tablet 5   rifampin (RIFADIN) 300 MG capsule Take 1 capsule (300 mg total) by mouth every 12 (twelve) hours. 60 capsule 1   spironolactone (ALDACTONE) 25 MG tablet Take 0.5 tablets (12.5 mg  total) by mouth daily. 15 tablet 11   tamsulosin (FLOMAX) 0.4 MG CAPS capsule Take 0.4 mg by mouth daily after breakfast.     timolol (TIMOPTIC) 0.5 % ophthalmic solution Place 1 drop into both eyes in the morning.  2   tiZANidine (ZANAFLEX) 4 MG tablet Take 4 mg by mouth at bedtime.     vitamin B-12 (CYANOCOBALAMIN) 500 MCG tablet Take 500 mcg by mouth daily.     vitamin E 180 MG (400 UNITS) capsule Take 400 Units by mouth daily.     No current facility-administered medications for this visit.     Abtx:  Anti-infectives (From admission, onward)    None       REVIEW OF SYSTEMS:  Const: negative fever, negative chills, negative weight loss Eyes: negative diplopia or visual changes, negative eye pain ENT: negative coryza, negative sore throat Resp: negative cough, hemoptysis, dyspnea Cards: negative for chest pain, palpitations, has lower extremity edema GU: negative for frequency, dysuria and hematuria GI: Negative for abdominal pain, diarrhea, bleeding, constipation Skin: dryness skin over legs Some excoriations Heme: negative for easy bruising and gum/nose bleeding MS: some weakness No rt knee pain Neurolo:negative for headaches, dizziness, vertigo, memory problems  Psych: negative for feelings of anxiety, depression  Endocrine: negative for thyroid, diabetes Allergy/Immunology- as above Objective:  VITALS:  BP 117/69    Pulse 80    Temp 97.6 F (36.4 C) (Oral)    Wt 178 lb (80.7 kg)    BMI 24.83 kg/m   PHYSICAL EXAM:  General:  Alert, cooperative, no distress, appears stated age.  Head: Normocephalic, without obvious abnormality, atraumatic. Eyes: Conjunctivae clear, anicteric sclerae. Pupils are equal ENT Nares normal. No drainage or sinus tenderness. Lips, mucosa, and tongue normal. No Thrush Neck: Supple, symmetrical, no adenopathy, thyroid: non tender no carotid bruit and no JVD. Back: No CVA tenderness. Lungs: Clear to auscultation bilaterally. No Wheezing or  Rhonchi. No rales. Heart: Regular rate and rhythm, no murmur, rub or gallop. Abdomen: Soft, non-tender,not distended. Bowel sounds normal. No masses Extremities: edema legs, dryness skin Rt knee surgical scar healed well Skin: No rashes or lesions. Or bruising Lymph: Cervical, supraclavicular normal. Neurologic: Grossly non-focal Pertinent Labs Lab Results CBC    Component Value Date/Time   WBC 6.0 11/20/2021 0816   RBC 4.19 (L) 11/20/2021 0816   HGB 9.7 (L) 11/20/2021 0816   HGB 12.0 (L) 11/17/2020 1135   HCT 33.3 (L) 11/20/2021 0816   HCT 37.4 (L) 11/17/2020 1135   PLT 132 (L) 11/20/2021 0816   PLT 242 11/17/2020 1135   MCV 79.5 (L) 11/20/2021 0816   MCV 81 11/17/2020 1135   MCV 88 08/17/2012 1506   MCH 23.2 (L) 11/20/2021 0816   MCHC 29.1 (L) 11/20/2021 0816   RDW 19.0 (H) 11/20/2021 0816   RDW 14.4 11/17/2020 1135   RDW 13.4 08/17/2012 1506   LYMPHSABS 1.6 11/19/2021 0446   MONOABS 0.6 11/19/2021 0446   EOSABS 0.2 11/19/2021 0446   BASOSABS 0.0 11/19/2021 0446    CMP Latest Ref Rng & Units 11/21/2021 11/20/2021 11/19/2021  Glucose 70 - 99 mg/dL 100(H) 111(H) 114(H)  BUN 8 - 23 mg/dL 32(H) 28(H) 29(H)  Creatinine 0.61 - 1.24 mg/dL 1.07 0.86 0.88  Sodium 135 - 145 mmol/L 129(L) 129(L) 130(L)  Potassium 3.5 - 5.1 mmol/L 4.6 4.6 4.6  Chloride 98 - 111 mmol/L 91(L) 92(L) 91(L)  CO2 22 - 32 mmol/L '27 25 30  ' Calcium 8.9 - 10.3 mg/dL 8.8(L) 8.7(L) 9.1  Total Protein 6.5 - 8.1 g/dL - - 7.5  Total Bilirubin 0.3 - 1.2 mg/dL - - 2.9(H)  Alkaline Phos 38 - 126 U/L - - 108  AST 15 - 41 U/L - - 34  ALT 0 - 44 U/L - - 11   ? Impression/Recommendation ?Staph aureus  prosthetic joint infection of the right knee. Poly exchange done Pt completed  6 weeks of IV cefazolin l 11/12/21- HE did not start rifampin until  10/30/21 because of abnormal LFTS due to CHF and nausea due to cefazolin Pt is currently on keflex 580m Po TID and rifampin 3052mPO BID and will complete them end of may  2023 and after that will be on a suppressive antibiotic indefinitely with Keflex [id number]   Complicated cardiovascular history Recent high risk PCI with stenting of the LAD under Impella support on plavix/aspirin, atorvastatin and metoprolol  Congestive heart failure with EF of 25 to 30% with peripheral edema legs.managed by cardiologist, on entresto, spirinolactone, furosemide and metolazone   Anemia  Bilateral lower extremity venous edema/venous stasis  History of CVA  History of PAD  History of left TKA   Follow up 3 month ? ? ___________________________________________________ Discussed with patient,partner and his daughter ( by phone in detail) Note:  This document was prepared using Dragon voice recognition software and may include unintentional dictation errors.

## 2021-12-04 NOTE — Patient Instructions (Addendum)
You are here for follow up of rt knee prosthetic joint infection- you will complete the current dose of keflex 500mg Po TID and rifampin 500mg Po BID end of May. After that you will go on keflex twice a day indefinitely. I will see you in May °On Thursday when you go to  the Hear failure clinic thy will do your ESR/CRP labs °

## 2021-12-06 ENCOUNTER — Encounter: Payer: Self-pay | Admitting: Family

## 2021-12-06 ENCOUNTER — Other Ambulatory Visit: Payer: Self-pay

## 2021-12-06 ENCOUNTER — Ambulatory Visit: Payer: Medicare Other | Attending: Family | Admitting: Family

## 2021-12-06 VITALS — BP 119/79 | HR 72 | Resp 16 | Ht 71.0 in | Wt 177.0 lb

## 2021-12-06 DIAGNOSIS — Y838 Other surgical procedures as the cause of abnormal reaction of the patient, or of later complication, without mention of misadventure at the time of the procedure: Secondary | ICD-10-CM | POA: Diagnosis not present

## 2021-12-06 DIAGNOSIS — K219 Gastro-esophageal reflux disease without esophagitis: Secondary | ICD-10-CM | POA: Insufficient documentation

## 2021-12-06 DIAGNOSIS — Z96651 Presence of right artificial knee joint: Secondary | ICD-10-CM | POA: Insufficient documentation

## 2021-12-06 DIAGNOSIS — I779 Disorder of arteries and arterioles, unspecified: Secondary | ICD-10-CM | POA: Insufficient documentation

## 2021-12-06 DIAGNOSIS — Y792 Prosthetic and other implants, materials and accessory orthopedic devices associated with adverse incidents: Secondary | ICD-10-CM | POA: Insufficient documentation

## 2021-12-06 DIAGNOSIS — I2510 Atherosclerotic heart disease of native coronary artery without angina pectoris: Secondary | ICD-10-CM | POA: Diagnosis not present

## 2021-12-06 DIAGNOSIS — E1122 Type 2 diabetes mellitus with diabetic chronic kidney disease: Secondary | ICD-10-CM

## 2021-12-06 DIAGNOSIS — Z7982 Long term (current) use of aspirin: Secondary | ICD-10-CM | POA: Insufficient documentation

## 2021-12-06 DIAGNOSIS — Z7902 Long term (current) use of antithrombotics/antiplatelets: Secondary | ICD-10-CM | POA: Diagnosis not present

## 2021-12-06 DIAGNOSIS — I890 Lymphedema, not elsewhere classified: Secondary | ICD-10-CM

## 2021-12-06 DIAGNOSIS — N181 Chronic kidney disease, stage 1: Secondary | ICD-10-CM

## 2021-12-06 DIAGNOSIS — I959 Hypotension, unspecified: Secondary | ICD-10-CM | POA: Insufficient documentation

## 2021-12-06 DIAGNOSIS — I255 Ischemic cardiomyopathy: Secondary | ICD-10-CM | POA: Insufficient documentation

## 2021-12-06 DIAGNOSIS — E1151 Type 2 diabetes mellitus with diabetic peripheral angiopathy without gangrene: Secondary | ICD-10-CM | POA: Diagnosis not present

## 2021-12-06 DIAGNOSIS — T8453XD Infection and inflammatory reaction due to internal right knee prosthesis, subsequent encounter: Secondary | ICD-10-CM | POA: Insufficient documentation

## 2021-12-06 DIAGNOSIS — I5022 Chronic systolic (congestive) heart failure: Secondary | ICD-10-CM | POA: Insufficient documentation

## 2021-12-06 DIAGNOSIS — T8450XD Infection and inflammatory reaction due to unspecified internal joint prosthesis, subsequent encounter: Secondary | ICD-10-CM

## 2021-12-06 DIAGNOSIS — E785 Hyperlipidemia, unspecified: Secondary | ICD-10-CM | POA: Diagnosis not present

## 2021-12-06 DIAGNOSIS — Z955 Presence of coronary angioplasty implant and graft: Secondary | ICD-10-CM | POA: Diagnosis not present

## 2021-12-06 DIAGNOSIS — I110 Hypertensive heart disease with heart failure: Secondary | ICD-10-CM | POA: Insufficient documentation

## 2021-12-06 DIAGNOSIS — Z888 Allergy status to other drugs, medicaments and biological substances status: Secondary | ICD-10-CM | POA: Insufficient documentation

## 2021-12-06 DIAGNOSIS — Z8673 Personal history of transient ischemic attack (TIA), and cerebral infarction without residual deficits: Secondary | ICD-10-CM | POA: Insufficient documentation

## 2021-12-06 DIAGNOSIS — J90 Pleural effusion, not elsewhere classified: Secondary | ICD-10-CM | POA: Diagnosis not present

## 2021-12-06 DIAGNOSIS — Z794 Long term (current) use of insulin: Secondary | ICD-10-CM | POA: Insufficient documentation

## 2021-12-06 DIAGNOSIS — Z79899 Other long term (current) drug therapy: Secondary | ICD-10-CM | POA: Insufficient documentation

## 2021-12-06 LAB — BASIC METABOLIC PANEL
Anion gap: 7 (ref 5–15)
BUN: 32 mg/dL — ABNORMAL HIGH (ref 8–23)
CO2: 28 mmol/L (ref 22–32)
Calcium: 9 mg/dL (ref 8.9–10.3)
Chloride: 101 mmol/L (ref 98–111)
Creatinine, Ser: 1.03 mg/dL (ref 0.61–1.24)
GFR, Estimated: >60 mL/min (ref >60)
Glucose, Bld: 120 mg/dL — ABNORMAL HIGH (ref 70–99)
Potassium: 4.4 mmol/L (ref 3.5–5.1)
Sodium: 136 mmol/L (ref 135–145)

## 2021-12-06 LAB — SEDIMENTATION RATE: Sed Rate: 46 mm/hr — ABNORMAL HIGH (ref 0–20)

## 2021-12-06 LAB — HEPATIC FUNCTION PANEL
ALT: 14 U/L (ref 0–44)
AST: 27 U/L (ref 15–41)
Albumin: 3.2 g/dL — ABNORMAL LOW (ref 3.5–5.0)
Alkaline Phosphatase: 97 U/L (ref 38–126)
Bilirubin, Direct: 1 mg/dL — ABNORMAL HIGH (ref 0.0–0.2)
Indirect Bilirubin: 1 mg/dL — ABNORMAL HIGH (ref 0.3–0.9)
Total Bilirubin: 2 mg/dL — ABNORMAL HIGH (ref 0.3–1.2)
Total Protein: 8 g/dL (ref 6.5–8.1)

## 2021-12-06 LAB — C-REACTIVE PROTEIN: CRP: 2.5 mg/dL — ABNORMAL HIGH (ref <1.0)

## 2021-12-06 NOTE — Progress Notes (Signed)
Patient ID: Henry Lindsey MRN: [id number]; DOB: 1933/07/20   Henry Lindsey is a 88 y.o. male with a history of CAD, ICM, HFrEF, HTN, HL, DMII, GERD, CVA, PAD, and carotid arterial disease.  Echo 08/10/21: Left Ventricle: Left ventricular ejection fraction, by estimation, is 25 to 30%. The left ventricle has severely decreased function. The left ventricle demonstrates global hypokinesis. The average left ventricular global longitudinal strain is -6.3 %. The global longitudinal strain is abnormal. The left ventricular internal  cavity size was normal in size. There is no left ventricular hypertrophy. Left ventricular diastolic parameters are consistent with Grade II diastolic dysfunction (pseudonormalization).   Right Ventricle: The right ventricular size is moderately enlarged. No increase in right ventricular wall thickness. Right ventricular systolic function is moderately reduced. There is mildly elevated pulmonary artery systolic pressure. The tricuspid  regurgitant velocity is 2.58 m/s, and with an assumed right atrial  pressure of 15 mmHg, the estimated right ventricular systolic pressure is 76.1 mmHg.   LHC 08/15/21:   Mid LM to Dist LM lesion is 30% stenosed.   Ost LAD lesion is 85% stenosed.   Prox LAD lesion is 50% stenosed.   Dist LAD lesion is 35% stenosed.   Prox Cx lesion is 50% stenosed.   Prox RCA to Dist RCA lesion is 100% stenosed.   1st Diag lesion is 80% stenosed.   1st Mrg lesion is 70% stenosed.   A drug-eluting stent was successfully placed using a STENT ONYX FRONTIER [id number].   Post intervention, there is a 0% residual stenosis.   Post intervention, there is a 0% residual stenosis.   Post intervention, there is a 0% residual stenosis.   Successful Impella supported high risk PCI of the ostial LAD with atherectomy and drug-eluting stent placement.  The stent extended 1 to 2 mm into the left main coronary artery to ensure coverage of the ostium.  There was normal flow  into the  left circumflex.Moderately elevated left ventricular end-diastolic pressure at 24 mmHg at the beginning of the case.  Admitted 11/15/21 for acute on chronic HF, right pleural effusion and subsequent thoracentesis Admitted 10/01/21 due to infection in right knee with right knee replacement. Discharged after 3 days.  Admitted 10/20-10/26/22 at [facility] due to progressive dyspnea, increasing abdominal girth, lower extremity edema and transferred to [facility] for high risk PCI. Successful Impella supported high risk PCI of the ostial LAD with atherectomy and drug-eluting stent placement.   He presents today for a follow-up visit with a chief complaint of minimal fatigue upon moderate exertion. He describes this as chronic in nature although feels like his energy level is improving. He has associated pedal edema. He denies any difficulty sleeping, dizziness, shortness of breath, cough, palpitations, chest pain, abdominal swelling  or weight gain.   He is weighing daily, in office today 177 lbs.   Past Medical History:  Diagnosis Date   Anginal pain ([place])    Aortic atherosclerosis (HCC)    Arthritis    BPH (benign prostatic hyperplasia)    CAD (coronary artery disease)    a.) 2/22 Cath: LM 50d, LAD sev ost dz, mod-sev prox/mid dz. D1 sev dz. LCX mild prox dzs, OM2 mod-sev dz, RCA 100p. RPL and RPDA fill via L-R collats. EF 35%. Seen by CVTS->Med mgmt. b.) R/LHC 08/14/21: 30% m-d-LM, 85% oLAD, 50% pLAD, 35% dLAD, 80% D1, 50% pLCx, 70% OM1, 100% p-dRCA -> trans to [facility]. c.) 08/15/21 Impella supported HIGH RISK PCI with atherectomy. 2.75 x  34m Onyx Frontier DES oLAD.   Carotid artery stenosis    a. 11/2010 U/S: <50% bilaterally; b. 03/2021 40-59% bilat ICA stenoses.   CHF (congestive heart failure) (HCC)    COPD (chronic obstructive pulmonary disease) ([facility]    noted CT 10/05/16 former smoker quit age 88   CVA (cerebral infarction) 11/2010   a.) RIGHT thalamic lacunar   GERD (gastroesophageal reflux  disease)    Heart murmur    HFrEF (heart failure with reduced ejection fraction) ([facility]    a. 10/2020 Echo: EF 35%.   Hyperlipidemia    Hypertension    Ischemic cardiomyopathy    a. 10/2020 Echo: EF 35%. Nl RVSP. Mod MR. Mild TR; b. 11/2020 Cath:  CO/CI 5.05/2.49. LV gram: EF 35%.   Lower extremity cellulitis    NSTEMI (non-ST elevated myocardial infarction) ([place] 08/09/2021   a.) R/LHC 08/14/2021: 30% m-d-LM, 85% oLAD, 50% pLAD, 35% dLAD, 80% D1, 50% pLCx, 70% OM1, 100% p-dRCA -> transfer to [facility]. b.) 08/15/2021 Impella supported HIGH RISK PCI with atherectomy. 2.75 x 278mOnyx Frontier DES to [facility]  Peripheral vascular disease in diabetes mellitus ([facility] [place]   a. 06/2012 95% occlusion s/p PTA R SFA (Dr. DeLucky Cowboy b. 03/2021 ABIs: R 1.02, L 0.96.   Pneumonia    Type 2 diabetes mellitus [facility] with insulin ([facility]   Past Surgical History:  Procedure Laterality Date   ABDOMINAL AORTOGRAM N/A 08/14/2021   Procedure: ABDOMINAL AORTOGRAM;  Surgeon: EnNelva BushMD;  [place] [facility];  Service: Cardiovascular;  Laterality: N/A;   CHOLECYSTECTOMY N/A 11/13/2016   Procedure: LAPAROSCOPIC CHOLECYSTECTOMY WITH INTRAOPERATIVE CHOLANGIOGRAM;  Surgeon: JoOlean ReeMD;  Location: [facility] ORS;  Service: General;  Laterality: N/A;   COLONOSCOPY WITH PROPOFOL N/A 02/10/2018   Procedure: COLONOSCOPY WITH PROPOFOL;  Surgeon: WoLucilla LameMD;  Location: [facility] ENDOSCOPY;  Service: Endoscopy;  Laterality: N/A;   CORONARY ATHERECTOMY N/A 08/15/2021   Procedure: CORONARY ATHERECTOMY;  Surgeon: ArWellington HampshireMD;  [place] [facility];  Service: Cardiovascular;  Laterality: N/A;   CORONARY STENT INTERVENTION W/IMPELLA N/A 08/15/2021   Procedure: Coronary Stent Intervention w/Impella;  Surgeon: ArWellington HampshireMD;  [place];  Service: Cardiovascular;  Laterality: N/A;   ERCP N/A 11/17/2016   Procedure: ENDOSCOPIC RETROGRADE CHOLANGIOPANCREATOGRAPHY (ERCP);  Surgeon: JoIrene ShipperMD;  [facility];  Service: Endoscopy;  Laterality: N/A;   ERCP N/A 02/04/2017   Procedure: ENDOSCOPIC RETROGRADE CHOLANGIOPANCREATOGRAPHY (ERCP) Stent removal;  Surgeon: DaLucilla LameMD;  Location: [facility] ENDOSCOPY;  Service: Endoscopy;  Laterality: N/A;   IR GENERIC HISTORICAL  10/06/2016   IR PERC CHOLECYSTOSTOMY 10/06/2016 GlAletta EdouardMD [facility]-INTER[facility] RAD   JOINT REPLACEMENT Left 2014   left knee   RIGHT/LEFT HEART CATH AND CORONARY ANGIOGRAPHY N/A 11/24/2020   Procedure: RIGHT/LEFT HEART CATH AND CORONARY ANGIOGRAPHY;  Surgeon: GoMinna MerrittsMD;  [place];  Service: Cardiovascular;  Laterality: N/A;   RIGHT/LEFT HEART CATH AND CORONARY ANGIOGRAPHY N/A 08/14/2021   Procedure: RIGHT/LEFT HEART CATH AND CORONARY ANGIOGRAPHY;  Surgeon: EnNelva BushMD;  [place] [facility];  Service: Cardiovascular;  Laterality: N/A;   TOTAL KNEE ARTHROPLASTY Right 08/08/2020   Procedure: TOTAL KNEE ARTHROPLASTY;  Surgeon: BoLovell SheehanMD;  Location: [facility] ORS;  Service: Orthopedics;  Laterality: Right;   TOTAL KNEE REVISION Right 10/01/2021   Procedure: IRRIGATION AND DEBRIDMENT RIGHT KNEE WITH POLY EXCHANGE;  Surgeon: BoLovell SheehanMD;  Location: [facility] ORS;  Service: Orthopedics;  Laterality: Right;   UPPER GASTROINTESTINAL ENDOSCOPY     WRIST SURGERY     Family History  Problem Relation Age of Onset   Diabetes Mother    Social History   Tobacco Use   Smoking status: Former    Packs/day: 0.50    Years: 33.00    Pack years: 16.50    Types: Cigarettes    Quit date: 04/21/1968    Years since quitting: 53.6   Smokeless tobacco: Never  Substance Use Topics   Alcohol use: No   Allergies  Allergen Reactions   Jardiance [Empagliflozin]     Rash all over and lips turn purple.    Metformin And Related Diarrhea   Trazodone Other (See Comments)    Extreme hallucinations/ psychosis    Torsemide Rash    "Skin reaction"   Prior to Admission  medications   Medication Sig Start Date End Date Taking? Authorizing Provider  ascorbic acid (VITAMIN C) 500 MG tablet Take 500 mg by mouth daily.    Yes [provider]  aspirin EC 81 MG tablet Take 1 tablet (81 mg total) by mouth daily. Swallow whole. 01/31/21  Yes Minna Merritts, MD  atorvastatin (LIPITOR) 40 MG tablet Take 1 tablet (40 mg total) by mouth daily. Patient taking differently: Take 40 mg by mouth in the morning. 12/08/20  Yes Gollan, Kathlene November, MD  Blood Glucose Monitoring Suppl (ONE TOUCH ULTRA SYSTEM KIT) w/Device KIT 1 kit by Does not apply route once. Use DX code E11.59 10/18/15  Yes [name], MD  ceFAZolin (ANCEF) IVPB Inject 2 g into the vein every 8 (eight) hours. Indication:  MSSA prosthetic joint infection of knee First Dose: Yes Last Day of Therapy:  11/12/2021 Labs - Once weekly on MONDAYS:  CBC/D, CMP, ESR and CRP Please pull PIC at completion of IV antibiotics Fax weekly labs to [phone] Method of administration: IV Push Method of administration may be changed at the discretion of home infusion pharmacist based upon assessment of the patient and/or caregiver's ability to self-administer the medication ordered. 10/04/21 11/12/21 Yes Carlynn Spry, PA-C  cholecalciferol (VITAMIN D) 25 MCG (1000 UNIT) tablet Take 1,000 Units by mouth daily.   Yes [provider]  clopidogrel (PLAVIX) 75 MG tablet TAKE 1 TABLET(75 MG) BY MOUTH DAILY WITH BREAKFAST 09/11/21  Yes Gollan, Kathlene November, MD  cyclobenzaprine (FLEXERIL) 5 MG tablet Take 5-10 mg by mouth daily as needed for muscle spasms. 07/31/21  Yes [provider]  diclofenac Sodium (VOLTAREN) 1 % GEL Apply 2 g topically 2 (two) times daily as needed (pain).   Yes [provider]  docusate sodium (COLACE) 100 MG capsule Take 1 capsule (100 mg total) by mouth 2 (two) times daily. 10/04/21  Yes Carlynn Spry, PA-C  furosemide (LASIX) 40 MG tablet Take 1 tablet (40 mg total) by  mouth 2 (two) times daily. 10/19/21 01/17/22 Yes Hackney, Otila Kluver A, FNP  gabapentin (NEURONTIN) 400 MG capsule Take 400 mg by mouth 2 (two) times daily.   Yes [provider]  glipiZIDE (GLUCOTROL) 10 MG tablet Take 10 mg by mouth daily before breakfast.   Yes [provider]  HYDROcodone-acetaminophen (NORCO/VICODIN) 5-325 MG tablet Take 1 tablet by mouth every 4 (four) hours as needed for moderate pain (pain score 4-6). 10/04/21  Yes Carlynn Spry, PA-C  INS SYRINGE/NEEDLE 1CC/28G (B-D INSULIN SYRINGE 1CC/28G) 28G X 1/2" 1 ML MISC USE TO ADMINISTER INSULIN DAILY  02/13/16  Yes [name], MD  insulin aspart protamine- aspart (NOVOLOG MIX 70/30) (70-30) 100 UNIT/ML injection Inject 0.15 mLs (15 Units total) into the skin 2 (two) times daily with a meal. 08/14/21  Yes Fritzi Mandes, MD  latanoprost (XALATAN) 0.005 % ophthalmic solution Place 1 drop into both eyes at bedtime. 11/01/15  Yes [provider]  metolazone (ZAROXOLYN) 2.5 MG tablet Take 1 tablet (2.5 mg total) by mouth 2 (two) times a week. Take this 1/2 hour before furosemide 10/29/21 01/27/22 Yes Hackney, Tina A, FNP  metoprolol succinate (TOPROL-XL) 50 MG 24 hr tablet Take 1 tablet (50 mg total) by mouth daily. Take with or immediately following a meal. 08/21/21  Yes Bhagat, Bhavinkumar, PA  Multiple Vitamin (MULTIVITAMIN WITH MINERALS) TABS tablet Take 1 tablet by mouth in the morning.   Yes [provider]  oxyCODONE (OXY IR/ROXICODONE) 5 MG immediate release tablet Take 5 mg by mouth every 4 (four) hours as needed for severe pain.   Yes [provider]  pantoprazole (PROTONIX) 40 MG tablet Take 40 mg by mouth daily.   Yes [provider]  potassium chloride SA (KLOR-CON M) 20 MEQ tablet Take 1 tablet (20 mEq total) by mouth 2 (two) times a week. 10/29/21  Yes Darylene Price A, FNP  tamsulosin (FLOMAX) 0.4 MG CAPS capsule Take 0.4 mg by mouth daily after breakfast.   Yes [provider]   timolol (TIMOPTIC) 0.5 % ophthalmic solution Place 1 drop into both eyes in the morning. 12/19/17  Yes [provider]  vitamin B-12 (CYANOCOBALAMIN) 500 MCG tablet Take 500 mcg by mouth daily.   Yes [provider]  vitamin E 180 MG (400 UNITS) capsule Take 400 Units by mouth daily.   Yes [provider]  albuterol (VENTOLIN HFA) 108 (90 Base) MCG/ACT inhaler Inhale 2 puffs into the lungs every 6 (six) hours as needed for shortness of breath or wheezing. Patient not taking: Reported on 10/30/2021 10/29/20   [provider]  nitroGLYCERIN (NITROSTAT) 0.4 MG SL tablet Place 1 tablet (0.4 mg total) under the tongue every 5 (five) minutes as needed for chest pain. MAXIMUM 3 TABLETS Patient not taking: Reported on 10/30/2021 12/08/20   Minna Merritts, MD  ONE [facility] ULTRA TEST test strip USE 1 STRIP TO TEST THREE TIMES DAILY AS DIRECTED 08/31/18   [name], MD  [facility] DELICA LANCETS 30Q MISC Use three times daily to check blood sugar. 10/18/15   [name], MD  rifampin (RIFADIN) 300 MG capsule Take 1 capsule (300 mg total) by mouth 2 (two) times daily. Patient not taking: Reported on 10/23/2021 10/04/21 11/12/21  Carlynn Spry, PA-C  sacubitril-valsartan (ENTRESTO) 24-26 MG Take 1 tablet by mouth 2 (two) times daily. Patient not taking: Reported on 10/05/2021 09/25/21 11/24/21  Furth, Cadence H, PA-C  spironolactone (ALDACTONE) 25 MG tablet Take 0.5 tablets (12.5 mg total) by mouth daily. Patient not taking: Reported on 10/23/2021 08/21/21   Leanor Kail, PA  tiZANidine (ZANAFLEX) 4 MG tablet Take 4 mg by mouth at bedtime. 10/29/21   [provider]    Review of Systems  Constitutional:  Positive for malaise/fatigue (improving). Negative for weight loss.  HENT: Negative.    Eyes: Negative.   Respiratory:  Negative for cough, shortness of breath and wheezing.   Cardiovascular:  Positive for leg swelling. Negative for chest pain, palpitations and  orthopnea.  Gastrointestinal:  Negative for constipation, diarrhea and nausea.  Genitourinary: Negative.  Musculoskeletal: Negative.  Negative for falls.  Skin: Negative.   Neurological:  Negative for dizziness, weakness and headaches.  Endo/Heme/Allergies:  Bruises/bleeds easily.  Psychiatric/Behavioral:  The patient is not nervous/anxious and does not have insomnia.     Vitals:   12/06/21 1426  BP: 119/79  Pulse: 72  Resp: 16  SpO2: 100%  Weight: 177 lb (80.3 kg)  Height: _0  (1.803 m)   Wt Readings from Last 3 Encounters:  12/06/21 177 lb (80.3 kg)  12/04/21 178 lb (80.7 kg)  11/26/21 183 lb (83 kg)   Lab Results  Component Value Date   CREATININE 1.03 12/06/2021   CREATININE 1.07 11/21/2021   CREATININE 0.86 11/20/2021   Physical Exam Vitals and nursing note reviewed. Exam conducted with a chaperone present (girlfriend [name])).  Constitutional:      General: He is not in acute distress.    Appearance: Normal appearance. He is not ill-appearing.  Neck:     Vascular: No carotid bruit.  Cardiovascular:     Rate and Rhythm: Normal rate and regular rhythm.     Heart sounds: Normal heart sounds.  Pulmonary:     Effort: No respiratory distress.     Breath sounds: No wheezing or rales.  Abdominal:     General: There is no distension.     Palpations: Abdomen is soft.  Musculoskeletal:        General: Swelling present. No tenderness.     Right lower leg: Edema (1+ pitting) present.     Left lower leg: Edema (1+ pitting) present.  Skin:    General: Skin is warm and dry.  Neurological:     Mental Status: He is alert and oriented to person, place, and time.  Psychiatric:        Mood and Affect: Mood normal.        Thought Content: Thought content normal.    Assessment & Plan: 1: Chronic Heart failure with reduced ejection fraction- - NYHA II - euvolemic - weighing daily; reminded to call for an overnight weight gain of > 2 pounds or a weekly weight gain of >  5 pounds - weight today 177, previous weight in office 193 - on GDMT of metoprolol succinate, on losartan  - Entresto previously stopped due to hypotension - allergic to Jardiance - BP historically soft; today is 119/79 - saw cardiology (Dunn) on 11/15/21  - O2 @ 2L QHS  - BNP 11/15/20 was 2065.7 - check BMP today  2.  CAD- - DES to ostial LAD - ASA, plavix  3.  DM- - A1c 09/26/21 was 7.0% - BMP 11/21/21 reviewed and showed sodium 129, potassium 4.6, creatinine 1.07 and GFR >60 - saw PCP Edwina Barth) 07/11/21  4. Lymphedema- - stage 2 - limited in his ability to exercise due to his shortness of breath - wearing compression stockings daily - consider compression boots if edema persists  5: Prosthetic joint infection- - saw ID Delaine Lame) 12/04/21 - she requested that we check LFT's, Esr and CRP for her   Medication list reviewed  Return in 1 month or sooner if needed for any questions/problems.

## 2021-12-07 ENCOUNTER — Telehealth: Payer: Self-pay | Admitting: Family

## 2021-12-07 NOTE — Telephone Encounter (Signed)
Spoke with daughter regarding lab results recently obtained. Have also routed these lab results to Dr. Delaine Lame.

## 2021-12-19 ENCOUNTER — Telehealth: Payer: Self-pay | Admitting: Cardiovascular Disease

## 2021-12-19 NOTE — Telephone Encounter (Signed)
?  Pt c/o medication issue: ? ?1. Name of Medication: not sure which one ? ?2. How are you currently taking this medication (dosage and times per day)?  ? ?3. Are you having a reaction (difficulty breathing--STAT)? Sores on skin - itching  ? ?4. What is your medication issue? Patient daughter calling, states patient is most likely having an allergic reaction to one of his medications, not sure which but would like to discuss.  States she would like to speak with the pharmacists if possible. ?

## 2021-12-19 NOTE — Telephone Encounter (Signed)
Returned call to pt's daughter. Pt does have 32 medications on his list. Reports itchy sores on his body. Reports no changes in medications recently aside from starting rifampin 1 month ago. No new detergents, clothing etc. Rifampin can cause delayed hypersensitivity reactions, skin rash, SJS, TEN, etc. Advised her to call MD who prescribed pt's rifampin for evaluation. She verbalized understanding. ?

## 2021-12-21 ENCOUNTER — Telehealth: Payer: Self-pay

## 2021-12-21 NOTE — Telephone Encounter (Signed)
Daughter reached out to report that patient is having severe rash on arms and legs and pharmacy thought Rifampin was the cause. Patient has been on Rifampin for weeks. She  recalls Dr Rockey Situ giving a new med Amiodarone and this started  after new RX. She stopped the patient from taking this and began Benadryl. I have messaged Dr Rockey Situ to report this issue. I advised her to have patient go to ED if this does not improve or if worsens. If any swelling, increase in redness, or anything new happens before speaking to cardiology then go to ED.  ?

## 2021-12-24 ENCOUNTER — Telehealth: Payer: Self-pay | Admitting: Cardiovascular Disease

## 2021-12-24 NOTE — Telephone Encounter (Signed)
Pt c/o medication issue: ? ?1. Name of Medication: Amiodarone '200mg'$  ? ?2. How are you currently taking this medication (dosage and times per day)? 1 tablet a daily, stopped taking friday ? ?3. Are you having a reaction (difficulty breathing--STAT)? Bloody blister that ich & bleed, on both arms, neck, chest & face ? ?4. What is your medication issue? Only new medication added ?

## 2021-12-24 NOTE — Telephone Encounter (Signed)
Amiodarone removed from med list ?Amiodarone added to allergy list, reaction "hives"  ?

## 2021-12-24 NOTE — Telephone Encounter (Signed)
Verbally concsulted Dr. Rockey Situ ?Instructed to remain off amiodarone d/t rash, need rash to clear up first before starting new med or retrying amiodarone ?Pt started on amiodarone d/t PVCs ?Advised need pt to monitor HR ? ?Return call to pt's daughter (DPR approved), advised on stated above, she reports understanding, wants sooner appt for Mr. Donaway as his next appt is in [name], pt reschedule for 4/14 with Ignacia Bayley, NP. ? ?Otherwise all questions were address and no additional concerns at this time. Daughter thankful for the return call and advice. Agreeable to plan, will call back for anything further.   ? ?

## 2021-12-31 ENCOUNTER — Telehealth: Payer: Self-pay

## 2021-12-31 ENCOUNTER — Other Ambulatory Visit: Payer: Self-pay | Admitting: Infectious Disease

## 2021-12-31 MED ORDER — DOXYCYCLINE HYCLATE 100 MG PO TABS: 100.0000 mg | ORAL_TABLET | Freq: Two times a day (BID) | ORAL | 5 refills | Status: DC

## 2021-12-31 NOTE — Telephone Encounter (Signed)
Daughter Candace reached out to alert mDr Ravishankar or covering provider that patient has had allergic reaction to Keflex and was asked by another doctor to STOP the keflex and let ID know incase new Abx need to be started. Advised her that I will alert DR Tommy Medal who will be covering [facility] ID this PM. Will contact with plan after he reviews this.  ?

## 2022-01-01 ENCOUNTER — Telehealth: Payer: Self-pay

## 2022-01-01 NOTE — Telephone Encounter (Signed)
Advised daughter [name] that since another provider stopped Keflex due to allergic reaction, Dr Tommy Medal has sent Doxy to be taken twice a day and they need to touch base with Dr Delaine Lame when she returns 3/21/ 2023. ?

## 2022-01-04 ENCOUNTER — Encounter: Payer: Self-pay | Admitting: Family

## 2022-01-04 ENCOUNTER — Other Ambulatory Visit: Payer: Self-pay

## 2022-01-04 ENCOUNTER — Ambulatory Visit: Payer: Medicare Other | Attending: Family | Admitting: Family

## 2022-01-04 VITALS — BP 100/59 | HR 79 | Resp 14 | Ht 71.0 in | Wt 171.4 lb

## 2022-01-04 DIAGNOSIS — I110 Hypertensive heart disease with heart failure: Secondary | ICD-10-CM | POA: Diagnosis present

## 2022-01-04 DIAGNOSIS — E1151 Type 2 diabetes mellitus with diabetic peripheral angiopathy without gangrene: Secondary | ICD-10-CM | POA: Insufficient documentation

## 2022-01-04 DIAGNOSIS — Z79899 Other long term (current) drug therapy: Secondary | ICD-10-CM | POA: Insufficient documentation

## 2022-01-04 DIAGNOSIS — I255 Ischemic cardiomyopathy: Secondary | ICD-10-CM | POA: Diagnosis not present

## 2022-01-04 DIAGNOSIS — I890 Lymphedema, not elsewhere classified: Secondary | ICD-10-CM | POA: Diagnosis not present

## 2022-01-04 DIAGNOSIS — Z96651 Presence of right artificial knee joint: Secondary | ICD-10-CM | POA: Insufficient documentation

## 2022-01-04 DIAGNOSIS — Y838 Other surgical procedures as the cause of abnormal reaction of the patient, or of later complication, without mention of misadventure at the time of the procedure: Secondary | ICD-10-CM | POA: Diagnosis not present

## 2022-01-04 DIAGNOSIS — Z9981 Dependence on supplemental oxygen: Secondary | ICD-10-CM | POA: Diagnosis not present

## 2022-01-04 DIAGNOSIS — T8453XA Infection and inflammatory reaction due to internal right knee prosthesis, initial encounter: Secondary | ICD-10-CM | POA: Diagnosis not present

## 2022-01-04 DIAGNOSIS — M549 Dorsalgia, unspecified: Secondary | ICD-10-CM | POA: Insufficient documentation

## 2022-01-04 DIAGNOSIS — Z7982 Long term (current) use of aspirin: Secondary | ICD-10-CM | POA: Diagnosis not present

## 2022-01-04 DIAGNOSIS — T8450XD Infection and inflammatory reaction due to unspecified internal joint prosthesis, subsequent encounter: Secondary | ICD-10-CM

## 2022-01-04 DIAGNOSIS — R21 Rash and other nonspecific skin eruption: Secondary | ICD-10-CM | POA: Diagnosis not present

## 2022-01-04 DIAGNOSIS — E785 Hyperlipidemia, unspecified: Secondary | ICD-10-CM | POA: Insufficient documentation

## 2022-01-04 DIAGNOSIS — Z794 Long term (current) use of insulin: Secondary | ICD-10-CM | POA: Insufficient documentation

## 2022-01-04 DIAGNOSIS — Z888 Allergy status to other drugs, medicaments and biological substances status: Secondary | ICD-10-CM | POA: Diagnosis not present

## 2022-01-04 DIAGNOSIS — Z955 Presence of coronary angioplasty implant and graft: Secondary | ICD-10-CM | POA: Insufficient documentation

## 2022-01-04 DIAGNOSIS — J90 Pleural effusion, not elsewhere classified: Secondary | ICD-10-CM | POA: Insufficient documentation

## 2022-01-04 DIAGNOSIS — Z833 Family history of diabetes mellitus: Secondary | ICD-10-CM | POA: Insufficient documentation

## 2022-01-04 DIAGNOSIS — N181 Chronic kidney disease, stage 1: Secondary | ICD-10-CM

## 2022-01-04 DIAGNOSIS — G479 Sleep disorder, unspecified: Secondary | ICD-10-CM | POA: Insufficient documentation

## 2022-01-04 DIAGNOSIS — I6523 Occlusion and stenosis of bilateral carotid arteries: Secondary | ICD-10-CM | POA: Diagnosis not present

## 2022-01-04 DIAGNOSIS — I2510 Atherosclerotic heart disease of native coronary artery without angina pectoris: Secondary | ICD-10-CM | POA: Diagnosis not present

## 2022-01-04 DIAGNOSIS — E1122 Type 2 diabetes mellitus with diabetic chronic kidney disease: Secondary | ICD-10-CM

## 2022-01-04 DIAGNOSIS — K219 Gastro-esophageal reflux disease without esophagitis: Secondary | ICD-10-CM | POA: Diagnosis not present

## 2022-01-04 DIAGNOSIS — Z7902 Long term (current) use of antithrombotics/antiplatelets: Secondary | ICD-10-CM | POA: Insufficient documentation

## 2022-01-04 DIAGNOSIS — Z8673 Personal history of transient ischemic attack (TIA), and cerebral infarction without residual deficits: Secondary | ICD-10-CM | POA: Diagnosis not present

## 2022-01-04 DIAGNOSIS — I5022 Chronic systolic (congestive) heart failure: Secondary | ICD-10-CM | POA: Diagnosis not present

## 2022-01-04 NOTE — Progress Notes (Signed)
? Patient ID: Henry Lindsey, male    DOB: February 10, 1933, 88 y.o.   MRN: [id number] ? ?HPI ? ?Henry Lindsey is a 88 y.o. male with a history of CAD, ICM, HFrEF, HTN, HL, DMII, GERD, CVA, PAD, and carotid arterial disease. ? ?Echo 08/10/21: ?Left Ventricle: Left ventricular ejection fraction, by estimation, is 25 to 30%. The left ventricle has severely decreased function. The left ventricle demonstrates global hypokinesis. The average left ventricular global longitudinal strain is -6.3 %. The global longitudinal strain is abnormal. The left ventricular internal  ?cavity size was normal in size. There is no left ventricular hypertrophy. Left ventricular diastolic parameters are consistent with Grade II diastolic dysfunction (pseudonormalization).  ? ?Right Ventricle: The right ventricular size is moderately enlarged. No increase in right ventricular wall thickness. Right ventricular systolic function is moderately reduced. There is mildly elevated pulmonary artery systolic pressure. The tricuspid  ?regurgitant velocity is 2.58 m/s, and with an assumed right atrial  ?pressure of 15 mmHg, the estimated right ventricular systolic pressure is 69.6 mmHg.  ? ?LHC 08/15/21: ?  Mid LM to Dist LM lesion is 30% stenosed. ?  Ost LAD lesion is 85% stenosed. ?  Prox LAD lesion is 50% stenosed. ?  Dist LAD lesion is 35% stenosed. ?  Prox Cx lesion is 50% stenosed. ?  Prox RCA to Dist RCA lesion is 100% stenosed. ?  1st Diag lesion is 80% stenosed. ?  1st Mrg lesion is 70% stenosed. ?  A drug-eluting stent was successfully placed using a STENT ONYX FRONTIER [id number]. ?  Post intervention, there is a 0% residual stenosis. ?  Post intervention, there is a 0% residual stenosis. ?  Post intervention, there is a 0% residual stenosis. ?  ?Successful Impella supported high risk PCI of the ostial LAD with atherectomy and drug-eluting stent placement.  The stent extended 1 to 2 mm into the left main coronary artery to ensure coverage of the  ostium.  There was normal flow into the  left circumflex.Moderately elevated left ventricular end-diastolic pressure at 24 mmHg at the beginning of the case. ? ?Admitted 11/15/21 for acute on chronic HF, right pleural effusion and subsequent thoracentesis ?Admitted 10/01/21 due to infection in right knee with right knee replacement. Discharged after 3 days.  ?Admitted 10/20-10/26/22 at [facility] due to progressive dyspnea, increasing abdominal girth, lower extremity edema and transferred to [facility] for high risk PCI. Successful Impella supported high risk PCI of the ostial LAD with atherectomy and drug-eluting stent placement.  ? ?He presents today for a follow-up visit with a chief complaint of minimal fatigue upon moderate exertion. Describes this as chronic in nature having been present for several years. He has associated pedal edema, difficulty sleeping, rash and back pain along with this. He denies any dizziness, abdominal distention, palpitations, chest pain, shortness of breath, cough or weight gain.  ? ?Biggest complaint today is of continued rash. Says that the provider thought it may be related to the keflex so that has been changed although rash has continued but he does feel like it is improving. Has also been quite itchy.  ? ?Past Medical History:  ?Diagnosis Date  ? Anginal pain ([place])   ? Aortic atherosclerosis ([place])   ? Arthritis   ? BPH (benign prostatic hyperplasia)   ? CAD (coronary artery disease)   ? a.) 2/22 Cath: LM 50d, LAD sev ost dz, mod-sev prox/mid dz. D1 sev dz. LCX mild prox dzs, OM2 mod-sev dz, RCA 100p. RPL and  RPDA fill via L-R collats. EF 35%. Seen by CVTS->Med mgmt. b.) R/LHC 08/14/21: 30% m-d-LM, 85% oLAD, 50% pLAD, 35% dLAD, 80% D1, 50% pLCx, 70% OM1, 100% p-dRCA -> trans to [facility]. c.) 08/15/21 Impella supported HIGH RISK PCI with atherectomy. 2.75 x 9m Onyx Frontier DES oLAD.  ? Carotid artery stenosis   ? a. 11/2010 U/S: <50% bilaterally; b. 03/2021 40-59% bilat ICA stenoses.  ? CHF  (congestive heart failure) ([place]   ? COPD (chronic obstructive pulmonary disease) ([place]   ? noted CT 10/05/16 former smoker quit age 88  ? CVA (cerebral infarction) 11/2010  ? a.) RIGHT thalamic lacunar  ? GERD (gastroesophageal reflux disease)   ? Heart murmur   ? HFrEF (heart failure with reduced ejection fraction) ([place]   ? a. 10/2020 Echo: EF 35%.  ? Hyperlipidemia   ? Hypertension   ? Ischemic cardiomyopathy   ? a. 10/2020 Echo: EF 35%. Nl RVSP. Mod MR. Mild TR; b. 11/2020 Cath:  CO/CI 5.05/2.49. LV gram: EF 35%.  ? Lower extremity cellulitis   ? NSTEMI (non-ST elevated myocardial infarction) ([place] 08/09/2021  ? a.) R/LHC 08/14/2021: 30% m-d-LM, 85% oLAD, 50% pLAD, 35% dLAD, 80% D1, 50% pLCx, 70% OM1, 100% p-dRCA -> transfer to [facility]. b.) 08/15/2021 Impella supported HIGH RISK PCI with atherectomy. 2.75 x 240mOnyx Frontier DES to oLMarsh & McLennan ? Peripheral vascular disease in diabetes mellitus ([place]  ? a. 06/2012 95% occlusion s/p PTA R SFA (Dr. DeLucky Cowboy b. 03/2021 ABIs: R 1.02, L 0.96.  ? Pneumonia   ? Type 2 diabetes mellitus treated with insulin (HCM[place]  ? ?Past Surgical History:  ?Procedure Laterality Date  ? ABDOMINAL AORTOGRAM N/A 08/14/2021  ? Procedure: ABDOMINAL AORTOGRAM;  Surgeon: EnNelva BushMD;  [facility];  Service: Cardiovascular;  Laterality: N/A;  ? CHOLECYSTECTOMY N/A 11/13/2016  ? Procedure: LAPAROSCOPIC CHOLECYSTECTOMY WITH INTRAOPERATIVE CHOLANGIOGRAM;  Surgeon: JoOlean ReeMD;  [facility];  Service: General;  Laterality: N/A;  ? COLONOSCOPY WITH PROPOFOL N/A 02/10/2018  ? Procedure: COLONOSCOPY WITH PROPOFOL;  Surgeon: WoLucilla LameMD;  [facility];  Service: Endoscopy;  Laterality: N/A;  ? CORONARY ATHERECTOMY N/A 08/15/2021  ? Procedure: CORONARY ATHERECTOMY;  Surgeon: ArWellington HampshireMD;  [place];  Service: Cardiovascular;  Laterality: N/A;  ? CORONARY STENT INTERVENTION W/IMPELLA N/A 08/15/2021  ? Procedure: Coronary Stent  Intervention w/Impella;  Surgeon: ArWellington HampshireMD;  [place];  Service: Cardiovascular;  Laterality: N/A;  ? ERCP N/A 11/17/2016  ? Procedure: ENDOSCOPIC RETROGRADE CHOLANGIOPANCREATOGRAPHY (ERCP);  Surgeon: JoIrene ShipperMD;  [facility];  Service: Endoscopy;  Laterality: N/A;  ? ERCP N/A 02/04/2017  ? Procedure: ENDOSCOPIC RETROGRADE CHOLANGIOPANCREATOGRAPHY (ERCP) Stent removal;  Surgeon: DaLucilla LameMD;  [facility];  Service: Endoscopy;  Laterality: N/A;  ? IR GENERIC HISTORICAL  10/06/2016  ? IR PERC CHOLECYSTOSTOMY 10/06/2016 GlAletta EdouardMD [facility]  ? JOINT REPLACEMENT Left 2014  ? left knee  ? RIGHT/LEFT HEART CATH AND CORONARY ANGIOGRAPHY N/A 11/24/2020  ? Procedure: RIGHT/LEFT HEART CATH AND CORONARY ANGIOGRAPHY;  Surgeon: GoMinna MerrittsMD;  [facility];  Service: Cardiovascular;  Laterality: N/A;  ? RIGHT/LEFT HEART CATH AND CORONARY ANGIOGRAPHY N/A 08/14/2021  ? Procedure: RIGHT/LEFT HEART CATH AND CORONARY ANGIOGRAPHY;  Surgeon: EnNelva BushMD;  [facility];  Service: Cardiovascular;  Laterality: N/A;  ? TOTAL KNEE ARTHROPLASTY Right 08/08/2020  ?  Procedure: TOTAL KNEE ARTHROPLASTY;  Surgeon: Lovell Sheehan, MD;  [facility];  Service: Orthopedics;  Laterality: Right;  ? TOTAL KNEE REVISION Right 10/01/2021  ? Procedure: IRRIGATION AND DEBRIDMENT RIGHT KNEE WITH POLY EXCHANGE;  Surgeon: Lovell Sheehan, MD;  [facility];  Service: Orthopedics;  Laterality: Right;  ? UPPER GASTROINTESTINAL ENDOSCOPY    ? WRIST SURGERY    ? ?Family History  ?Problem Relation Age of Onset  ? Diabetes Mother   ? ?Social History  ? ?Tobacco Use  ? Smoking status: Former  ?  Packs/day: 0.50  ?  Years: 33.00  ?  Pack years: 16.50  ?  Types: Cigarettes  ?  Quit date: 04/21/1968  ?  Years since quitting: 53.7  ? Smokeless tobacco: Never  ?Substance Use Topics  ? Alcohol use: No  ? ?Allergies  ?Allergen Reactions   ? Keflex [Cephalexin] Rash  ? Amiodarone Hives  ? Jardiance [Empagliflozin]   ?  Rash all over and lips turn purple.   ? Metformin And Related Diarrhea  ? Trazodone Other (See Comments)  ?  Extreme hallucinations/

## 2022-01-04 NOTE — Patient Instructions (Signed)
Continue weighing daily and call for an overnight weight gain of 3 pounds or more or a weekly weight gain of more than 5 pounds.   If you have voicemail, please make sure your mailbox is cleaned out so that we may leave a message and please make sure to listen to any voicemails.     

## 2022-01-16 ENCOUNTER — Telehealth: Payer: Self-pay | Admitting: Cardiovascular Disease

## 2022-01-16 NOTE — Telephone Encounter (Signed)
Pt c/o medication issue: ? ?1. Name of Medication: rifampin  ? ?2. How are you currently taking this medication (dosage and times per day)? 300 mg po q 12  ? ?3. Are you having a reaction (difficulty breathing--STAT)?redness in legs , bumps that are bleeding on face and head  ? ?4. What is your medication issue? Patient was given this medication by infectious disease and the office is closed  ? ?

## 2022-01-16 NOTE — Telephone Encounter (Signed)
Called and spoke with patient.  ? ?Explained to patient that he should be evaluated by urgent care or in the ER, as this sounds as though he is having a very serious skin reaction to the rifampin, which is possible with this medication.  ? ?Pt asked, "Well what are you going to do about it when I see you guys on the 14th?" Explained to patient that we cannot treat something like this, as this is a cardiology office, and reiterated that he should be evaluated at urgent care or the ER, or at the very least by his PCP since he can't get in touch with ID.   ? ?Pt stated that there was no use in going to the ER or his general practitioner, but wouldn't explain why. Pt then started to ramble about how long he's been on the medication and giving various timelines of when the symptoms started, but from what was gathered it sounds as if his skin has been peeling, blistering, and bleeding for several weeks.  ? ?Reinforced the need to be evaluated by a medical professional other than our office, such as urgent care or PCP. Pt declined again, stating that he would just try to continue to call his ID MD.  ?

## 2022-01-17 ENCOUNTER — Other Ambulatory Visit
Admission: RE | Admit: 2022-01-17 | Discharge: 2022-01-17 | Disposition: A | Discharge status: Home / Self Care | Payer: Medicare Other | Attending: Infectious Diseases | Admitting: Infectious Diseases

## 2022-01-17 ENCOUNTER — Ambulatory Visit: Payer: Medicare Other | Attending: Infectious Diseases | Admitting: Infectious Diseases

## 2022-01-17 ENCOUNTER — Encounter: Payer: Self-pay | Admitting: Infectious Diseases

## 2022-01-17 VITALS — BP 121/73 | HR 66 | Temp 97.1°F | Wt 170.0 lb

## 2022-01-17 DIAGNOSIS — Z794 Long term (current) use of insulin: Secondary | ICD-10-CM | POA: Insufficient documentation

## 2022-01-17 DIAGNOSIS — T8450XD Infection and inflammatory reaction due to unspecified internal joint prosthesis, subsequent encounter: Secondary | ICD-10-CM | POA: Insufficient documentation

## 2022-01-17 DIAGNOSIS — T8453XD Infection and inflammatory reaction due to internal right knee prosthesis, subsequent encounter: Secondary | ICD-10-CM | POA: Insufficient documentation

## 2022-01-17 DIAGNOSIS — Z7982 Long term (current) use of aspirin: Secondary | ICD-10-CM | POA: Diagnosis not present

## 2022-01-17 DIAGNOSIS — Y792 Prosthetic and other implants, materials and accessory orthopedic devices associated with adverse incidents: Secondary | ICD-10-CM | POA: Insufficient documentation

## 2022-01-17 DIAGNOSIS — J90 Pleural effusion, not elsewhere classified: Secondary | ICD-10-CM | POA: Insufficient documentation

## 2022-01-17 DIAGNOSIS — Z8673 Personal history of transient ischemic attack (TIA), and cerebral infarction without residual deficits: Secondary | ICD-10-CM | POA: Insufficient documentation

## 2022-01-17 DIAGNOSIS — Z7902 Long term (current) use of antithrombotics/antiplatelets: Secondary | ICD-10-CM | POA: Insufficient documentation

## 2022-01-17 DIAGNOSIS — Z792 Long term (current) use of antibiotics: Secondary | ICD-10-CM | POA: Insufficient documentation

## 2022-01-17 DIAGNOSIS — L299 Pruritus, unspecified: Secondary | ICD-10-CM | POA: Diagnosis not present

## 2022-01-17 DIAGNOSIS — L539 Erythematous condition, unspecified: Secondary | ICD-10-CM | POA: Diagnosis not present

## 2022-01-17 DIAGNOSIS — I110 Hypertensive heart disease with heart failure: Secondary | ICD-10-CM | POA: Diagnosis not present

## 2022-01-17 DIAGNOSIS — I252 Old myocardial infarction: Secondary | ICD-10-CM | POA: Diagnosis not present

## 2022-01-17 DIAGNOSIS — I6523 Occlusion and stenosis of bilateral carotid arteries: Secondary | ICD-10-CM | POA: Diagnosis not present

## 2022-01-17 DIAGNOSIS — R600 Localized edema: Secondary | ICD-10-CM | POA: Insufficient documentation

## 2022-01-17 DIAGNOSIS — R21 Rash and other nonspecific skin eruption: Secondary | ICD-10-CM | POA: Diagnosis not present

## 2022-01-17 DIAGNOSIS — I2510 Atherosclerotic heart disease of native coronary artery without angina pectoris: Secondary | ICD-10-CM | POA: Insufficient documentation

## 2022-01-17 DIAGNOSIS — Z7182 Exercise counseling: Secondary | ICD-10-CM | POA: Insufficient documentation

## 2022-01-17 DIAGNOSIS — I3139 Other pericardial effusion (noninflammatory): Secondary | ICD-10-CM | POA: Diagnosis not present

## 2022-01-17 DIAGNOSIS — E1151 Type 2 diabetes mellitus with diabetic peripheral angiopathy without gangrene: Secondary | ICD-10-CM | POA: Insufficient documentation

## 2022-01-17 DIAGNOSIS — Z96653 Presence of artificial knee joint, bilateral: Secondary | ICD-10-CM | POA: Diagnosis not present

## 2022-01-17 DIAGNOSIS — I5023 Acute on chronic systolic (congestive) heart failure: Secondary | ICD-10-CM | POA: Insufficient documentation

## 2022-01-17 DIAGNOSIS — A4901 Methicillin susceptible Staphylococcus aureus infection, unspecified site: Secondary | ICD-10-CM | POA: Diagnosis present

## 2022-01-17 DIAGNOSIS — D649 Anemia, unspecified: Secondary | ICD-10-CM | POA: Diagnosis not present

## 2022-01-17 DIAGNOSIS — Z79899 Other long term (current) drug therapy: Secondary | ICD-10-CM | POA: Diagnosis not present

## 2022-01-17 DIAGNOSIS — Z955 Presence of coronary angioplasty implant and graft: Secondary | ICD-10-CM | POA: Insufficient documentation

## 2022-01-17 DIAGNOSIS — Z833 Family history of diabetes mellitus: Secondary | ICD-10-CM | POA: Insufficient documentation

## 2022-01-17 DIAGNOSIS — M7989 Other specified soft tissue disorders: Secondary | ICD-10-CM | POA: Diagnosis not present

## 2022-01-17 LAB — CBC WITH DIFFERENTIAL/PLATELET
Abs Immature Granulocytes: 0.01 10*3/uL (ref 0.00–0.07)
Basophils Absolute: 0 10*3/uL (ref 0.0–0.1)
Basophils Relative: 1 %
Eosinophils Absolute: 0.4 10*3/uL (ref 0.0–0.5)
Eosinophils Relative: 7 %
HCT: 44.1 % (ref 39.0–52.0)
Hemoglobin: 12.6 g/dL — ABNORMAL LOW (ref 13.0–17.0)
Immature Granulocytes: 0 %
Lymphocytes Relative: 35 %
Lymphs Abs: 1.9 10*3/uL (ref 0.7–4.0)
MCH: 23.2 pg — ABNORMAL LOW (ref 26.0–34.0)
MCHC: 28.6 g/dL — ABNORMAL LOW (ref 30.0–36.0)
MCV: 81.2 fL (ref 80.0–100.0)
Monocytes Absolute: 0.6 10*3/uL (ref 0.1–1.0)
Monocytes Relative: 11 %
Neutro Abs: 2.6 10*3/uL (ref 1.7–7.7)
Neutrophils Relative %: 46 %
Platelets: 178 10*3/uL (ref 150–400)
RBC: 5.43 MIL/uL (ref 4.22–5.81)
RDW: 21.5 % — ABNORMAL HIGH (ref 11.5–15.5)
Smear Review: NORMAL
WBC: 5.5 10*3/uL (ref 4.0–10.5)
nRBC: 0 % (ref 0.0–0.2)

## 2022-01-17 LAB — COMPREHENSIVE METABOLIC PANEL
ALT: 16 U/L (ref 0–44)
AST: 31 U/L (ref 15–41)
Albumin: 3.3 g/dL — ABNORMAL LOW (ref 3.5–5.0)
Alkaline Phosphatase: 81 U/L (ref 38–126)
Anion gap: 8 (ref 5–15)
BUN: 38 mg/dL — ABNORMAL HIGH (ref 8–23)
CO2: 31 mmol/L (ref 22–32)
Calcium: 9 mg/dL (ref 8.9–10.3)
Chloride: 99 mmol/L (ref 98–111)
Creatinine, Ser: 0.96 mg/dL (ref 0.61–1.24)
GFR, Estimated: >60 mL/min (ref >60)
Glucose, Bld: 90 mg/dL (ref 70–99)
Potassium: 4.2 mmol/L (ref 3.5–5.1)
Sodium: 138 mmol/L (ref 135–145)
Total Bilirubin: 1.3 mg/dL — ABNORMAL HIGH (ref 0.3–1.2)
Total Protein: 8.2 g/dL — ABNORMAL HIGH (ref 6.5–8.1)

## 2022-01-17 LAB — SEDIMENTATION RATE: Sed Rate: 12 mm/hr (ref 0–20)

## 2022-01-17 LAB — C-REACTIVE PROTEIN: CRP: 1.1 mg/dL — ABNORMAL HIGH (ref <1.0)

## 2022-01-17 NOTE — Progress Notes (Signed)
NAME: Henry Lindsey  ?DOB: 1933-07-05  ?MRN: [id number]  ?Date/Time: 01/17/2022 10:23 AM ? ? ?Patient is here with his partner. ?Urgent visit ?For a rash ?Pt says he has had an itchy rash over his arms and face and it has been going on for a few weeks- He was taking keflex and rifampin and the former was discontinued in March 2023 and started DOXY mid march- he also stopped rifampin and the rash got better- so her restarted rifampin and the pruritus got worse ?So he is seeing me today ?Yesterday I spoke to his daughter candace and asked to stop rifampin completely ?No fever or chills ?No nausea or vomiting ?Says the itching is much better today ? ? ?Following taken from last note ?Henry Lindsey is a 88 y.o. with a history of  CAD status post stent placement, bilateral leg swelling, right prosthetic joint infection, peripheral arterial disease,  Patient underwent knee replacement April 2021.  Then in December 2021 patient started having shortness of breath and bronchitis-like feature and x-ray had shown bilateral pleural effusion which was getting worse with lower extremity edema.  He was given Lasix by his PCP and recommended echocardiogram.  Echo done on 11/14/2020 showed ejection fraction of 35% with moderate MR, mild TR and mild pericardial effusion. ?Then in February 2022 he underwent a cardiac cath and that showed 70% stenosis at the LAD and proximal RCA 100% stenosis and first diagonal 80% stenosis.  He was seen by cardiothoracic surgeon and [facility] and was recommended medical management. ?He was also followed at the wound clinic because of bilateral venous edema and venous stasis ulcers and weeping and scabbing wounds on the right leg.  During October 2022 he was hospitalized with worsening shortness of breath.  He was diagnosed with NSTEMI and acute on chronic systolic congestive heart failure and acute on chronic.  He underwent another cardiac cath which showed severe triple-vessel disease with LAD severe  ostial disease.  Dr. Fletcher Anon took him to [facility] and did a successful Impella supported high risk PCI of the ostial LAD with atherectomy and drug-eluting stent placement on 08/15/21.  He was placed on dual antiplatelet therapy. Rt knee remained swollen and painful. He saw Dr. Harlow Mares on 09/21/2021 and the right knee was aspirated and a cortisone injection was given.  As the culture grew staph aureus patient was asked to get hospitalized and underwent irrigation and debridement right knee with polyexchange on 10/01/2021. He was discharged home on cefazolin 2 grams IV q8 and rifampin '300mg'$  PO bID until 11/12/21 for 6 weeks. He completed Iv cefazolin and now on PO Keflex along with rifampin ?He was recently hospitalized for fluid overload and had to have thoracentesis and a liter and a half fluid was drained.  Patient is doing better now ?He is asked to weigh himself every day.  He is on metolazone ?He is being followed by heart failure clinic ?Patient is 100% compliant with his medication. ?His daughter fills the Pillbox for him ?I spoke to [facility] daughter as well. ?Past Medical History:  ?Diagnosis Date  ? Anginal pain ([place])   ? Aortic atherosclerosis ([place])   ? Arthritis   ? BPH (benign prostatic hyperplasia)   ? CAD (coronary artery disease)   ? a.) 2/22 Cath: LM 50d, LAD sev ost dz, mod-sev prox/mid dz. D1 sev dz. LCX mild prox dzs, OM2 mod-sev dz, RCA 100p. RPL and RPDA fill via L-R collats. EF 35%. Seen by CVTS->Med mgmt. b.) R/LHC 08/14/21: 30%  m-d-LM, 85% oLAD, 50% pLAD, 35% dLAD, 80% D1, 50% pLCx, 70% OM1, 100% p-dRCA -> trans to [facility]. c.) 08/15/21 Impella supported HIGH RISK PCI with atherectomy. 2.75 x 3m Onyx Frontier DES oLAD.  ? Carotid artery stenosis   ? a. 11/2010 U/S: <50% bilaterally; b. 03/2021 40-59% bilat ICA stenoses.  ? CHF (congestive heart failure) ([place]   ? COPD (chronic obstructive pulmonary disease) ([place]   ? noted CT 10/05/16 former smoker quit age 88  ? CVA (cerebral infarction) 11/2010   ? a.) RIGHT thalamic lacunar  ? GERD (gastroesophageal reflux disease)   ? Heart murmur   ? HFrEF (heart failure with reduced ejection fraction) ([place]   ? a. 10/2020 Echo: EF 35%.  ? Hyperlipidemia   ? Hypertension   ? Ischemic cardiomyopathy   ? a. 10/2020 Echo: EF 35%. Nl RVSP. Mod MR. Mild TR; b. 11/2020 Cath:  CO/CI 5.05/2.49. LV gram: EF 35%.  ? Lower extremity cellulitis   ? NSTEMI (non-ST elevated myocardial infarction) ([place] 08/09/2021  ? a.) R/LHC 08/14/2021: 30% m-d-LM, 85% oLAD, 50% pLAD, 35% dLAD, 80% D1, 50% pLCx, 70% OM1, 100% p-dRCA -> transfer to [facility]. b.) 08/15/2021 Impella supported HIGH RISK PCI with atherectomy. 2.75 x 263mOnyx Frontier DES to oLMarsh & McLennan ? Peripheral vascular disease in diabetes mellitus ([place]CLiber[place]  ? a. 06/2012 95% occlusion s/p PTA R SFA (Dr. DeLucky Cowboy b. 03/2021 ABIs: R 1.02, L 0.96.  ? Pneumonia   ? Type 2 diabetes mellitus treated with insulin ([facility]Grand Rivers  ?  ?Past Surgical History:  ?Procedure Laterality Date  ? ABDOMINAL AORTOGRAM N/A 08/14/2021  ? Procedure: ABDOMINAL AORTOGRAM;  Surgeon: EnNelva BushMD;  [facility];  Service: Cardiovascular;  Laterality: N/A;  ? CHOLECYSTECTOMY N/A 11/13/2016  ? Procedure: LAPAROSCOPIC CHOLECYSTECTOMY WITH INTRAOPERATIVE CHOLANGIOGRAM;  Surgeon: JoOlean ReeMD;  [facility];  Service: General;  Laterality: N/A;  ? COLONOSCOPY WITH PROPOFOL N/A 02/10/2018  ? Procedure: COLONOSCOPY WITH PROPOFOL;  Surgeon: WoLucilla LameMD;  [facility];  Service: Endoscopy;  Laterality: N/A;  ? CORONARY ATHERECTOMY N/A 08/15/2021  ? Procedure: CORONARY ATHERECTOMY;  Surgeon: ArWellington HampshireMD;  [facility];  Service: Cardiovascular;  Laterality: N/A;  ? CORONARY STENT INTERVENTION W/IMPELLA N/A 08/15/2021  ? Procedure: Coronary Stent Intervention w/Impella;  Surgeon: ArWellington HampshireMD;  [facility];  Service: Cardiovascular;  Laterality: N/A;  ? ERCP N/A 11/17/2016  ? Procedure: ENDOSCOPIC  RETROGRADE CHOLANGIOPANCREATOGRAPHY (ERCP);  Surgeon: JoIrene ShipperMD;  [facility];  Service: Endoscopy;  Laterality: N/A;  ? ERCP N/A 02/04/2017  ? Procedure: ENDOSCOPIC RETROGRADE CHOLANGIOPANCREATOGRAPHY (ERCP) Stent removal;  Surgeon: DaLucilla LameMD;  [facility];  Service: Endoscopy;  Laterality: N/A;  ? IR GENERIC HISTORICAL  10/06/2016  ? IR PERC CHOLECYSTOSTOMY 10/06/2016 GlAletta EdouardMD [facility]  ? JOINT REPLACEMENT Left 2014  ? left knee  ? RIGHT/LEFT HEART CATH AND CORONARY ANGIOGRAPHY N/A 11/24/2020  ? Procedure: RIGHT/LEFT HEART CATH AND CORONARY ANGIOGRAPHY;  Surgeon: GoMinna MerrittsMD;  [facility];  Service: Cardiovascular;  Laterality: N/A;  ? RIGHT/LEFT HEART CATH AND CORONARY ANGIOGRAPHY N/A 08/14/2021  ? Procedure: RIGHT/LEFT HEART CATH AND CORONARY ANGIOGRAPHY;  Surgeon: EnNelva BushMD;  [facility]: [facility];  Service: Cardiovascular;  Laterality: N/A;  ? TOTAL KNEE ARTHROPLASTY Right 08/08/2020  ? Procedure: TOTAL KNEE ARTHROPLASTY;  Surgeon: BoLovell SheehanMD;  [facility];  Service: Orthopedics;  Laterality: Right;  ? TOTAL KNEE REVISION Right 10/01/2021  ? Procedure: IRRIGATION AND DEBRIDMENT RIGHT KNEE WITH POLY EXCHANGE;  Surgeon: Lovell Sheehan, MD;  [facility];  Service: Orthopedics;  Laterality: Right;  ? UPPER GASTROINTESTINAL ENDOSCOPY    ? WRIST SURGERY    ?  ?Social History  ? ?Socioeconomic History  ? Marital status: Widowed  ?  Spouse name: Enid Derry, deceased  ? Number of children: 2  ? Years of education: 56  ? Highest education level: Not on file  ?Occupational History  ?  Employer: retired  ?Tobacco Use  ? Smoking status: Former  ?  Packs/day: 0.50  ?  Years: 33.00  ?  Pack years: 16.50  ?  Types: Cigarettes  ?  Quit date: 04/21/1968  ?  Years since quitting: 53.7  ? Smokeless tobacco: Never  ?Vaping Use  ? Vaping Use: Never used  ?Substance and Sexual Activity  ? Alcohol use: No  ? Drug use:  No  ? Sexual activity: Not Currently  ?  Partners: Female  ?  Birth control/protection: None  ?Other Topics Concern  ? Not on file  ?Social History Narrative  ? Widowed in 2014; dating again , stays w

## 2022-01-17 NOTE — Patient Instructions (Signed)
You are here for skin itching and rash. Please stop rifampin completely- continue only doxycycline- need to use sunscreen SPF 60 all the time as risk for photosensitivity with Doxy ?Will do labs today and will follow up 1 month ?Please do the leg and foot exercises I taught you today for the  swelling in your legs . ? ?

## 2022-01-18 ENCOUNTER — Telehealth: Payer: Self-pay

## 2022-01-18 NOTE — Telephone Encounter (Signed)
-----   Message from Tsosie Billing, MD sent at 01/18/2022 11:58 AM EDT ----- ?Please let the patent know the labs looked good and no evidence of infection in the rt knee- need to continue doxy with sun protection SPF 43. Thx ? ?----- Message ----- ?From: Interface, Lab In [place] ?Sent: 01/17/2022  11:19 AM EDT ?To: Tsosie Billing, MD ? ? ?

## 2022-01-18 NOTE — Telephone Encounter (Signed)
Patient aware of lab results and to continue doxy with sun protection SPF 24.  ? ? ? ?Eithen Castiglia P Tenzin Edelman, CMA ? ?

## 2022-01-31 ENCOUNTER — Ambulatory Visit (INDEPENDENT_AMBULATORY_CARE_PROVIDER_SITE_OTHER): Payer: Medicare Other | Admitting: Nurse Practitioner

## 2022-01-31 ENCOUNTER — Other Ambulatory Visit (INDEPENDENT_AMBULATORY_CARE_PROVIDER_SITE_OTHER): Payer: Self-pay | Admitting: Vascular Surgery

## 2022-01-31 ENCOUNTER — Encounter: Payer: Self-pay | Admitting: Nurse Practitioner

## 2022-01-31 VITALS — BP 108/52 | HR 58 | Ht 71.0 in | Wt 178.0 lb

## 2022-01-31 DIAGNOSIS — I10 Essential (primary) hypertension: Secondary | ICD-10-CM

## 2022-01-31 DIAGNOSIS — I739 Peripheral vascular disease, unspecified: Secondary | ICD-10-CM

## 2022-01-31 DIAGNOSIS — E785 Hyperlipidemia, unspecified: Secondary | ICD-10-CM

## 2022-01-31 DIAGNOSIS — I255 Ischemic cardiomyopathy: Secondary | ICD-10-CM | POA: Diagnosis not present

## 2022-01-31 DIAGNOSIS — I2510 Atherosclerotic heart disease of native coronary artery without angina pectoris: Secondary | ICD-10-CM | POA: Diagnosis not present

## 2022-01-31 DIAGNOSIS — I5022 Chronic systolic (congestive) heart failure: Secondary | ICD-10-CM | POA: Diagnosis not present

## 2022-01-31 MED ORDER — METOLAZONE 2.5 MG PO TABS: 2.5000 mg | ORAL_TABLET | ORAL | 3 refills | Status: DC | PRN

## 2022-01-31 MED ORDER — ATORVASTATIN CALCIUM 40 MG PO TABS: 20.0000 mg | ORAL_TABLET | ORAL | 3 refills | Status: DC

## 2022-01-31 NOTE — Progress Notes (Signed)
? ? ?Office Visit  ?  ?Patient Name: Henry Lindsey ?Date of Encounter: 01/31/2022 ? ?Primary Care Provider:  Baxter Hire, MD ?Primary Cardiologist:  Ida Rogue, MD ? ?Chief Complaint  ?  ?88 year old male with a history of coronary artery disease, ischemic cardiomyopathy, HFrEF, hypertension, hyperlipidemia, type 2 diabetes mellitus, GERD, CVA, PAD, and carotid arterial disease, who presents for follow-up of CAD and heart failure. ? ?Past Medical History  ?  ?Past Medical History:  ?Diagnosis Date  ? Anginal pain ([place])   ? Aortic atherosclerosis ([place])   ? Arthritis   ? BPH (benign prostatic hyperplasia)   ? CAD (coronary artery disease)   ? a.) 2/22 Cath: LM 50d, LAD sev ost dz, mod-sev prox/mid dz. D1 sev dz. LCX mild prox dzs, OM2 mod-sev dz, RCA 100p. RPL and RPDA fill via L-R collats. EF 35%. Seen by CVTS->Med mgmt. b.) R/LHC 08/14/21: 30% m-d-LM, 85% oLAD, 50% pLAD, 35% dLAD, 80% D1, 50% pLCx, 70% OM1, 100% p-dRCA -> trans to [facility]. c.) 08/15/21 Impella supported HIGH RISK PCI with atherectomy. 2.75 x 17m Onyx Frontier DES oLAD.  ? Carotid artery stenosis   ? a. 11/2010 U/S: <50% bilaterally; b. 03/2021 40-59% bilat ICA stenoses.  ? COPD (chronic obstructive pulmonary disease) ([place]   ? noted CT 10/05/16 former smoker quit age 88  ? CVA (cerebral infarction) 11/2010  ? a.) RIGHT thalamic lacunar  ? GERD (gastroesophageal reflux disease)   ? Heart murmur   ? HFrEF (heart failure with reduced ejection fraction) ([place]   ? a. 10/2020 Echo: EF 35%; b. 10/2021 Echo: EF 30-35%, mod reduced RV fxn, mod dil LA.  ? Hyperlipidemia   ? Hypertension   ? Ischemic cardiomyopathy   ? a. 10/2020 Echo: EF 35%. Nl RVSP. Mod MR. Mild TR; b. 11/2020 Cath:  CO/CI 5.05/2.49. LV gram: EF 35%; c. 10/2021 Echo: EF 30-35%, mod reduced RV fxn, mod dil LA.  ? Lower extremity cellulitis   ? NSTEMI (non-ST elevated myocardial infarction) ([place] 08/09/2021  ? a.) R/LHC 08/14/2021: 30% m-d-LM, 85% oLAD, 50% pLAD, 35% dLAD, 80% D1, 50% pLCx,  70% OM1, 100% p-dRCA -> [facility] to [facility]. b.) 08/15/2021 Impella supported HIGH RISK PCI with atherectomy. 2.75 x 290mOnyx Frontier DES to oLMarsh & McLennan ? Peripheral vascular disease in diabetes mellitus ([place]  ? a. 06/2012 95% occlusion s/p PTA R SFA (Dr. DeLucky Cowboy b. 03/2021 ABIs: R 1.02, L 0.96.  ? Pneumonia   ? Type 2 diabetes mellitus treated with insulin ([place]  ? ?Past Surgical History:  ?Procedure Laterality Date  ? ABDOMINAL AORTOGRAM N/A 08/14/2021  ? Procedure: ABDOMINAL AORTOGRAM;  Surgeon: EnNelva BushMD;  [facility];  Service: Cardiovascular;  Laterality: N/A;  ? CHOLECYSTECTOMY N/A 11/13/2016  ? Procedure: LAPAROSCOPIC CHOLECYSTECTOMY WITH INTRAOPERATIVE CHOLANGIOGRAM;  Surgeon: JoOlean ReeMD;  [facility];  Service: General;  Laterality: N/A;  ? COLONOSCOPY WITH PROPOFOL N/A 02/10/2018  ? Procedure: COLONOSCOPY WITH PROPOFOL;  Surgeon: WoLucilla LameMD;  [facility];  Service: Endoscopy;  Laterality: N/A;  ? CORONARY ATHERECTOMY N/A 08/15/2021  ? Procedure: CORONARY ATHERECTOMY;  Surgeon: ArWellington HampshireMD;  [facility];  Service: Cardiovascular;  Laterality: N/A;  ? CORONARY STENT INTERVENTION W/IMPELLA N/A 08/15/2021  ? Procedure: Coronary Stent Intervention w/Impella;  Surgeon: ArWellington HampshireMD;  [place] [facility];  Service: Cardiovascular;  Laterality: N/A;  ? ERCP N/A 11/17/2016  ? Procedure: ENDOSCOPIC RETROGRADE  CHOLANGIOPANCREATOGRAPHY (ERCP);  Surgeon: Irene Shipper, MD;  Location[facility];  Service: Endoscopy;  Laterality: N/A;  ? ERCP N/A 02/04/2017  ? Procedure: ENDOSCOPIC RETROGRADE CHOLANGIOPANCREATOGRAPHY (ERCP) Stent removal;  Surgeon: Lucilla Lame, MD;  [facility];  Service: Endoscopy;  Laterality: N/A;  ? IR GENERIC HISTORICAL  10/06/2016  ? IR PERC CHOLECYSTOSTOMY 10/06/2016 Aletta Edouard, MD [facility]  ? JOINT REPLACEMENT Left 2014  ? left knee  ? RIGHT/LEFT HEART CATH AND CORONARY  ANGIOGRAPHY N/A 11/24/2020  ? Procedure: RIGHT/LEFT HEART CATH AND CORONARY ANGIOGRAPHY;  Surgeon: Minna Merritts, MD;  Location: [place] [facility];  Service: Cardiovascular;  Laterality: N/A;  ? RIGHT/LEFT HEART CATH AND CORONARY ANGIOGRAPHY N/A 08/14/2021  ? Procedure: RIGHT/LEFT HEART CATH AND CORONARY ANGIOGRAPHY;  Surgeon: Nelva Bush, MD;  Location: [place] CV LAB;  Service: Cardiovascular;  Laterality: N/A;  ? TOTAL KNEE ARTHROPLASTY Right 08/08/2020  ? Procedure: TOTAL KNEE ARTHROPLASTY;  Surgeon: Lovell Sheehan, MD;  Location[facility];  Service: Orthopedics;  Laterality: Right;  ? TOTAL KNEE REVISION Right 10/01/2021  ? Procedure: IRRIGATION AND DEBRIDMENT RIGHT KNEE WITH POLY EXCHANGE;  Surgeon: Lovell Sheehan, MD;  [facility];  Service: Orthopedics;  Laterality: Right;  ? UPPER GASTROINTESTINAL ENDOSCOPY    ? WRIST SURGERY    ? ? ?Allergies ? ?Allergies  ?Allergen Reactions  ? Keflex [Cephalexin] Rash  ? Jardiance [Empagliflozin]   ?  Rash all over and lips turn purple.   ? Metformin And Related Diarrhea  ? Trazodone Other (See Comments)  ?  Extreme hallucinations/ psychosis   ? Torsemide Rash  ?  "Skin reaction"  ? ? ?History of Present Illness  ?  ?88 year old male with the above complex past medical history including CAD, ischemic cardiomyopathy, HFrEF, hypertension, hyperlipidemia, type 2 diabetes mellitus, GERD, CVA, PAD, and carotid arterial disease.  In the setting of respiratory failure with bronchitis and pneumonia in December 2021, he underwent echocardiogram in early 2022, which showed an EF of 35% with moderate MR and mild TR.  He was referred to cardiology and underwent diagnostic catheterization in February 2022 showing severe diffuse LAD, diagonal, and obtuse marginal disease with an occluded RCA.  He was evaluated by CT surgery and felt to be a poor candidate was subsequently medically managed.  He was admitted to [facility] in October 2022 in the setting  of heart failure and non-STEMI.  Diagnostic catheterization revealed relatively unchanged anatomy and he was transferred to [facility] and underwent high risk atherectomy, PCI, and stenting of the proximal LAD with Impella support.  He was readmitted in January 2023 with volume overload/HFrEF requiring aggressive diuresis.  During hospitalization, he was noted to have frequent PVCs, and was placed on oral amiodarone.  He was subsequently discharged and was last seen in cardiology clinic in early February, at which time his weight was up 4 pounds.  He was advised to use metolazone 2.5 mg up to 2 times a week. ? ?Since his last office visit, he has done well from a cardiac standpoint.  He has used metolazone sparingly, with his last dose being taken about 3 weeks ago.  He has not had any significant lower extremity edema and denies chest pain, palpitations, PND, orthopnea, dyspnea, or early satiety.  We called his daughter during his visit today to go over his medication list in depth.  Of note, he developed a rash in February and March and was seen by infectious disease, and placed on  doxycycline, with a plan for indefinite therapy.  There was initially some concern that amiodarone might of been contributing to rash but this has since been set aside and he has continued amiodarone.  His daughter also points out that he has a long history of muscle weakness and that their pharmacist suggested that perhaps ongoing statin therapy was contributing.  In that setting, patient has been taking atorvastatin 20 mg every other day and tolerating much better. ? ?Home Medications  ?  ?Current Outpatient Medications  ?Medication Sig Dispense Refill  ? albuterol (VENTOLIN HFA) 108 (90 Base) MCG/ACT inhaler Inhale 2 puffs into the lungs every 6 (six) hours as needed for shortness of breath or wheezing.    ? amiodarone (PACERONE) 200 MG tablet Take 200 mg by mouth daily. Not taking currently as it may cause rash    ? ascorbic acid  (VITAMIN C) 500 MG tablet Take 500 mg by mouth daily.     ? aspirin EC 81 MG tablet Take 1 tablet (81 mg total) by mouth daily. Swallow whole. 90 tablet 3  ? Blood Glucose Monitoring Suppl (ONE TOUCH ULTRA SYST

## 2022-01-31 NOTE — Patient Instructions (Signed)
Medication Instructions:  ?Medication list updated. ? ?*If you need a refill on your cardiac medications before your next appointment, please call your pharmacy* ? ? ?Lab Work: ?None ? ?If you have labs (blood work) drawn today and your tests are completely normal, you will receive your results only by: ?MyChart Message (if you have MyChart) OR ?A paper copy in the mail ?If you have any lab test that is abnormal or we need to change your treatment, we will call you to review the results. ? ? ?Testing/Procedures: ?None ? ? ?Follow-Up: ?At [facility], you and your health needs are our priority.  As part of our continuing mission to provide you with exceptional heart care, we have created designated Provider Care Teams.  These Care Teams include your primary Cardiologist (physician) and Advanced Practice Providers (APPs -  Physician Assistants and Nurse Practitioners) who all work together to provide you with the care you need, when you need it. ? ? ?Your next appointment:   ?3 month(s) ? ?The format for your next appointment:   ?In Person ? ?Provider:   ?Ida Rogue, MD or Murray Hodgkins, NP ? ? ? ?Important Information About Sugar ? ? ? ? ?  ?

## 2022-02-01 ENCOUNTER — Other Ambulatory Visit: Payer: Self-pay | Admitting: Cardiovascular Disease

## 2022-02-14 ENCOUNTER — Ambulatory Visit: Payer: Medicare Other | Attending: Infectious Diseases | Admitting: Infectious Diseases

## 2022-02-14 ENCOUNTER — Encounter: Payer: Self-pay | Admitting: Infectious Diseases

## 2022-02-14 VITALS — BP 123/68 | HR 56 | Temp 97.2°F | Wt 178.0 lb

## 2022-02-14 DIAGNOSIS — X58XXXD Exposure to other specified factors, subsequent encounter: Secondary | ICD-10-CM | POA: Diagnosis not present

## 2022-02-14 DIAGNOSIS — I110 Hypertensive heart disease with heart failure: Secondary | ICD-10-CM | POA: Diagnosis not present

## 2022-02-14 DIAGNOSIS — S81801D Unspecified open wound, right lower leg, subsequent encounter: Secondary | ICD-10-CM | POA: Insufficient documentation

## 2022-02-14 DIAGNOSIS — I252 Old myocardial infarction: Secondary | ICD-10-CM | POA: Diagnosis not present

## 2022-02-14 DIAGNOSIS — Z7902 Long term (current) use of antithrombotics/antiplatelets: Secondary | ICD-10-CM | POA: Insufficient documentation

## 2022-02-14 DIAGNOSIS — M7989 Other specified soft tissue disorders: Secondary | ICD-10-CM | POA: Diagnosis not present

## 2022-02-14 DIAGNOSIS — Z955 Presence of coronary angioplasty implant and graft: Secondary | ICD-10-CM | POA: Diagnosis not present

## 2022-02-14 DIAGNOSIS — Z833 Family history of diabetes mellitus: Secondary | ICD-10-CM | POA: Insufficient documentation

## 2022-02-14 DIAGNOSIS — T8453XD Infection and inflammatory reaction due to internal right knee prosthesis, subsequent encounter: Secondary | ICD-10-CM | POA: Diagnosis present

## 2022-02-14 DIAGNOSIS — I6523 Occlusion and stenosis of bilateral carotid arteries: Secondary | ICD-10-CM | POA: Diagnosis not present

## 2022-02-14 DIAGNOSIS — A4901 Methicillin susceptible Staphylococcus aureus infection, unspecified site: Secondary | ICD-10-CM | POA: Insufficient documentation

## 2022-02-14 DIAGNOSIS — E1151 Type 2 diabetes mellitus with diabetic peripheral angiopathy without gangrene: Secondary | ICD-10-CM | POA: Insufficient documentation

## 2022-02-14 DIAGNOSIS — T8450XD Infection and inflammatory reaction due to unspecified internal joint prosthesis, subsequent encounter: Secondary | ICD-10-CM

## 2022-02-14 DIAGNOSIS — Y838 Other surgical procedures as the cause of abnormal reaction of the patient, or of later complication, without mention of misadventure at the time of the procedure: Secondary | ICD-10-CM | POA: Insufficient documentation

## 2022-02-14 DIAGNOSIS — Z79899 Other long term (current) drug therapy: Secondary | ICD-10-CM | POA: Diagnosis not present

## 2022-02-14 DIAGNOSIS — L97919 Non-pressure chronic ulcer of unspecified part of right lower leg with unspecified severity: Secondary | ICD-10-CM | POA: Insufficient documentation

## 2022-02-14 DIAGNOSIS — Y792 Prosthetic and other implants, materials and accessory orthopedic devices associated with adverse incidents: Secondary | ICD-10-CM | POA: Insufficient documentation

## 2022-02-14 DIAGNOSIS — I2510 Atherosclerotic heart disease of native coronary artery without angina pectoris: Secondary | ICD-10-CM | POA: Insufficient documentation

## 2022-02-14 DIAGNOSIS — Z7982 Long term (current) use of aspirin: Secondary | ICD-10-CM | POA: Diagnosis not present

## 2022-02-14 DIAGNOSIS — D649 Anemia, unspecified: Secondary | ICD-10-CM | POA: Insufficient documentation

## 2022-02-14 DIAGNOSIS — L97929 Non-pressure chronic ulcer of unspecified part of left lower leg with unspecified severity: Secondary | ICD-10-CM | POA: Insufficient documentation

## 2022-02-14 DIAGNOSIS — Z8673 Personal history of transient ischemic attack (TIA), and cerebral infarction without residual deficits: Secondary | ICD-10-CM | POA: Diagnosis not present

## 2022-02-14 DIAGNOSIS — I5023 Acute on chronic systolic (congestive) heart failure: Secondary | ICD-10-CM | POA: Diagnosis not present

## 2022-02-14 DIAGNOSIS — Z7984 Long term (current) use of oral hypoglycemic drugs: Secondary | ICD-10-CM | POA: Insufficient documentation

## 2022-02-14 DIAGNOSIS — Z96653 Presence of artificial knee joint, bilateral: Secondary | ICD-10-CM | POA: Insufficient documentation

## 2022-02-14 DIAGNOSIS — I83209 Varicose veins of unspecified lower extremity with both ulcer of unspecified site and inflammation: Secondary | ICD-10-CM | POA: Insufficient documentation

## 2022-02-14 DIAGNOSIS — Z794 Long term (current) use of insulin: Secondary | ICD-10-CM | POA: Diagnosis not present

## 2022-02-14 DIAGNOSIS — Z792 Long term (current) use of antibiotics: Secondary | ICD-10-CM | POA: Diagnosis not present

## 2022-02-14 NOTE — Progress Notes (Signed)
NAME: Henry CASIQUE  ?DOB: 08-29-1933  ?MRN: [id number]  ?Date/Time: 02/14/2022 10:11 AM ? ? ?Patient is here with his partner. 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 ? ?Following taken from last note ?HASAAN Lindsey is a 88 y.o. with a history of  CAD status post stent placement, bilateral leg swelling, right prosthetic joint infection, peripheral arterial disease,  Patient underwent knee replacement April 2021.  Then in December 2021 patient started having shortness of breath and bronchitis-like feature and x-ray had shown bilateral pleural effusion which was getting worse with lower extremity edema.  He was given Lasix by his PCP and recommended echocardiogram.  Echo done on 11/14/2020 showed ejection fraction of 35% with moderate MR, mild TR and mild pericardial effusion. ?Then in February 2022 he underwent a cardiac cath and that showed 70% stenosis at the LAD and proximal RCA 100% stenosis and first diagonal 80% stenosis.  He was seen by cardiothoracic surgeon and [facility] and was recommended medical management. ?He was also followed at the wound clinic because of bilateral venous edema and venous stasis ulcers and weeping and scabbing wounds on the right leg.  During October 2022 he was hospitalized with worsening shortness of breath.  He was diagnosed with NSTEMI and acute on chronic systolic congestive heart failure and acute on chronic.  He underwent another cardiac cath which showed severe triple-vessel disease with LAD severe ostial disease.  Dr. Fletcher Anon took him to [facility] and did a successful Impella supported high risk PCI of the ostial LAD with atherectomy and drug-eluting stent placement on 08/15/21.  He was placed on dual antiplatelet therapy. Rt knee  remained swollen and painful. He saw Dr. Harlow Mares on 09/21/2021 and the right knee was aspirated and a cortisone injection was given.  As the culture grew staph aureus patient was asked to get hospitalized and underwent irrigation and debridement right knee with polyexchange on 10/01/2021. He was discharged home on cefazolin 2 grams IV q8 and rifampin '300mg'$  PO bID until 11/12/21 for 6 weeks. He completed Iv cefazolin and now on PO Keflex along with rifampin ?He was recently hospitalized for fluid overload and had to have thoracentesis and a liter and a half fluid was drained.  Patient is doing better now ?He is asked to weigh himself every day.  He is on metolazone ?He is being followed by heart failure clinic ?Patient is 100% compliant with his medication. ?His daughter fills the Pillbox for him ?I spoke to [facility] daughter as well. ?Past Medical History:  ?Diagnosis Date  ? Anginal pain ([place])   ? Aortic atherosclerosis ([place])   ? Arthritis   ? BPH (benign prostatic hyperplasia)   ? CAD (coronary artery disease)   ? a.) 2/22 Cath: LM 50d, LAD sev ost dz, mod-sev prox/mid dz. D1 sev dz. LCX mild prox dzs, OM2 mod-sev dz, RCA 100p. RPL and RPDA fill via L-R collats. EF 35%. Seen by CVTS->Med mgmt. b.) R/LHC 08/14/21: 30% m-d-LM, 85% oLAD, 50% pLAD, 35% dLAD, 80% D1, 50% pLCx, 70% OM1, 100% p-dRCA -> trans to [facility]. c.) 08/15/21 Impella supported HIGH RISK PCI with atherectomy. 2.75 x 95m Onyx Frontier DES oLAD.  ? Carotid artery stenosis   ? a. 11/2010 U/S: <  50% bilaterally; b. 03/2021 40-59% bilat ICA stenoses.  ? COPD (chronic obstructive pulmonary disease) ([place])   ? noted CT 10/05/16 former smoker quit age 52   ? CVA (cerebral infarction) 11/2010  ? a.) RIGHT thalamic lacunar  ? GERD (gastroesophageal reflux disease)   ? Heart murmur   ? HFrEF (heart failure with reduced ejection fraction) ([place])   ? a. 10/2020 Echo: EF 35%; b. 10/2021 Echo: EF 30-35%, mod reduced RV fxn, mod dil LA.  ? Hyperlipidemia   ? Hypertension   ?  Ischemic cardiomyopathy   ? a. 10/2020 Echo: EF 35%. Nl RVSP. Mod MR. Mild TR; b. 11/2020 Cath:  CO/CI 5.05/2.49. LV gram: EF 35%; c. 10/2021 Echo: EF 30-35%, mod reduced RV fxn, mod dil LA.  ? Lower extremity cellulitis   ? NSTEMI (non-ST elevated myocardial infarction) ([place]) 08/09/2021  ? a.) R/LHC 08/14/2021: 30% m-d-LM, 85% oLAD, 50% pLAD, 35% dLAD, 80% D1, 50% pLCx, 70% OM1, 100% p-dRCA -> transfer to [facility]. b.) 08/15/2021 Impella supported HIGH RISK PCI with atherectomy. 2.75 x 66m Onyx Frontier DES to o[employer] & [employer]  ? Peripheral vascular disease in diabetes mellitus ([place]   ? a. 06/2012 95% occlusion s/p PTA R SFA (Dr. DLucky Cowboy; b. 03/2021 ABIs: R 1.02, L 0.96.  ? Pneumonia   ? Type 2 diabetes mellitus treated with insulin ([place]   ?  ?Past Surgical History:  ?Procedure Laterality Date  ? ABDOMINAL AORTOGRAM N/A 08/14/2021  ? Procedure: ABDOMINAL AORTOGRAM;  Surgeon: ENelva Bush MD;  [facility] [facility];  Service: Cardiovascular;  Laterality: N/A;  ? CHOLECYSTECTOMY N/A 11/13/2016  ? Procedure: LAPAROSCOPIC CHOLECYSTECTOMY WITH INTRAOPERATIVE CHOLANGIOGRAM;  Surgeon: JOlean Ree MD;  [facility];  Service: General;  Laterality: N/A;  ? COLONOSCOPY WITH PROPOFOL N/A 02/10/2018  ? Procedure: COLONOSCOPY WITH PROPOFOL;  Surgeon: WLucilla Lame MD;  [facility];  Service: Endoscopy;  Laterality: N/A;  ? CORONARY ATHERECTOMY N/A 08/15/2021  ? Procedure: CORONARY ATHERECTOMY;  Surgeon: AWellington Hampshire MD;  [place];  Service: Cardiovascular;  Laterality: N/A;  ? CORONARY STENT INTERVENTION W/IMPELLA N/A 08/15/2021  ? Procedure: Coronary Stent Intervention w/Impella;  Surgeon: AWellington Hampshire MD;  [facility];  Service: Cardiovascular;  Laterality: N/A;  ? ERCP N/A 11/17/2016  ? Procedure: ENDOSCOPIC RETROGRADE CHOLANGIOPANCREATOGRAPHY (ERCP);  Surgeon: JIrene Shipper MD;  [facility];  Service: Endoscopy;  Laterality: N/A;  ? ERCP N/A 02/04/2017   ? Procedure: ENDOSCOPIC RETROGRADE CHOLANGIOPANCREATOGRAPHY (ERCP) Stent removal;  Surgeon: DLucilla Lame MD;  [facility];  Service: Endoscopy;  Laterality: N/A;  ? IR GENERIC HISTORICAL  10/06/2016  ? IR PERC CHOLECYSTOSTOMY 10/06/2016 GAletta Edouard MD [facility]  ? JOINT REPLACEMENT Left 2014  ? left knee  ? RIGHT/LEFT HEART CATH AND CORONARY ANGIOGRAPHY N/A 11/24/2020  ? Procedure: RIGHT/LEFT HEART CATH AND CORONARY ANGIOGRAPHY;  Surgeon: GMinna Merritts MD;  [facility];  Service: Cardiovascular;  Laterality: N/A;  ? RIGHT/LEFT HEART CATH AND CORONARY ANGIOGRAPHY N/A 08/14/2021  ? Procedure: RIGHT/LEFT HEART CATH AND CORONARY ANGIOGRAPHY;  Surgeon: ENelva Bush MD;  [place];  Service: Cardiovascular;  Laterality: N/A;  ? TOTAL KNEE ARTHROPLASTY Right 08/08/2020  ? Procedure: TOTAL KNEE ARTHROPLASTY;  Surgeon: BLovell Sheehan MD;  [facility];  Service: Orthopedics;  Laterality: Right;  ? TOTAL KNEE REVISION Right 10/01/2021  ? Procedure: IRRIGATION AND DEBRIDMENT RIGHT KNEE WITH POLY EXCHANGE;  Surgeon: BLovell Sheehan MD;  [facility];  Service: Orthopedics;  Laterality: Right;  ? UPPER GASTROINTESTINAL ENDOSCOPY    ? WRIST SURGERY    ?  ?Social History  ? ?Socioeconomic History  ? Marital status: Widowed  ?  Spouse name: Enid Derry, deceased  ? Number of children: 2  ? Years of education: 8  ? Highest education level: Not on file  ?Occupational History  ?  Employer: retired  ?Tobacco Use  ? Smoking status: Former  ?  Packs/day: 0.50  ?  Years: 33.00  ?  Pack years: 16.50  ?  Types: Cigarettes  ?  Quit date: 04/21/1968  ?  Years since quitting: 53.8  ? Smokeless tobacco: Never  ?Vaping Use  ? Vaping Use: Never used  ?Substance and Sexual Activity  ? Alcohol use: No  ? Drug use: No  ? Sexual activity: Not Currently  ?  Partners: Female  ?  Birth control/protection: None  ?Other Topics Concern  ? Not on file  ?Social History Narrative   ? Widowed in 2014; dating again , stays with girlfriend house  ? ?Social Determinants of Health  ? ?Financial Resource Strain: Not on file  ?Food Insecurity: Not on file  ?Transportation Needs: Not on file

## 2022-02-28 ENCOUNTER — Ambulatory Visit: Payer: Medicare Other | Admitting: Nurse Practitioner

## 2022-03-01 ENCOUNTER — Telehealth: Payer: Self-pay | Admitting: Cardiovascular Disease

## 2022-03-01 NOTE — Telephone Encounter (Signed)
Pt c/o medication issue: ? ?1. Name of Medication:  ? atorvastatin (LIPITOR) 40 MG tablet  ? ? ?2. How are you currently taking this medication (dosage and times per day)? Take 0.5 tablets (20 mg total) by mouth every other day. ? ?3. Are you having a reaction (difficulty breathing--STAT)? No ? ?4. What is your medication issue? Pt states that mediation is causing him to have muscle and joint pain. Pt would like to know if Dr. Rockey Situ is able to put him on Rosuvastatin or another Statin medication. Please advise ? ?

## 2022-03-05 MED ORDER — EZETIMIBE 10 MG PO TABS: 10.0000 mg | ORAL_TABLET | Freq: Every day | ORAL | 3 refills | Status: DC

## 2022-03-05 MED ORDER — ROSUVASTATIN CALCIUM 10 MG PO TABS: 10.0000 mg | ORAL_TABLET | Freq: Every day | ORAL | 3 refills | Status: DC

## 2022-03-05 NOTE — Telephone Encounter (Signed)
Henry Merritts, MD   ? ?Would try rosuvastatin 10 mg daily with Zetia 10 daily  ?For cramping would let us know  ?Thx  ?TG   ? ?Called patient. No answer. Left detailed message. Advised patient that should he have any questions to call back or send a MyChart message.  ?

## 2022-03-05 NOTE — Telephone Encounter (Signed)
Pt returned call.  ? ?Went over recommendations. Questions answered. Pt verbalized understanding of everything discussed. Pt voiced appreciation for the call.  ?

## 2022-03-09 ENCOUNTER — Other Ambulatory Visit: Payer: Self-pay | Admitting: Cardiovascular Disease

## 2022-03-12 ENCOUNTER — Ambulatory Visit: Payer: Medicare Other | Admitting: Infectious Diseases

## 2022-03-14 ENCOUNTER — Ambulatory Visit: Payer: Medicare Other | Admitting: Infectious Diseases

## 2022-04-04 ENCOUNTER — Ambulatory Visit: Payer: Medicare Other | Attending: Family | Admitting: Family

## 2022-04-04 ENCOUNTER — Encounter: Payer: Self-pay | Admitting: Family

## 2022-04-04 VITALS — BP 131/47 | HR 65 | Resp 18 | Ht 69.0 in | Wt 189.0 lb

## 2022-04-04 DIAGNOSIS — Z7902 Long term (current) use of antithrombotics/antiplatelets: Secondary | ICD-10-CM | POA: Diagnosis not present

## 2022-04-04 DIAGNOSIS — Z792 Long term (current) use of antibiotics: Secondary | ICD-10-CM | POA: Diagnosis not present

## 2022-04-04 DIAGNOSIS — T8450XD Infection and inflammatory reaction due to unspecified internal joint prosthesis, subsequent encounter: Secondary | ICD-10-CM

## 2022-04-04 DIAGNOSIS — E1151 Type 2 diabetes mellitus with diabetic peripheral angiopathy without gangrene: Secondary | ICD-10-CM | POA: Diagnosis not present

## 2022-04-04 DIAGNOSIS — K219 Gastro-esophageal reflux disease without esophagitis: Secondary | ICD-10-CM | POA: Diagnosis not present

## 2022-04-04 DIAGNOSIS — I110 Hypertensive heart disease with heart failure: Secondary | ICD-10-CM | POA: Insufficient documentation

## 2022-04-04 DIAGNOSIS — I2510 Atherosclerotic heart disease of native coronary artery without angina pectoris: Secondary | ICD-10-CM

## 2022-04-04 DIAGNOSIS — Y792 Prosthetic and other implants, materials and accessory orthopedic devices associated with adverse incidents: Secondary | ICD-10-CM | POA: Diagnosis not present

## 2022-04-04 DIAGNOSIS — Y838 Other surgical procedures as the cause of abnormal reaction of the patient, or of later complication, without mention of misadventure at the time of the procedure: Secondary | ICD-10-CM | POA: Diagnosis not present

## 2022-04-04 DIAGNOSIS — I5022 Chronic systolic (congestive) heart failure: Secondary | ICD-10-CM | POA: Insufficient documentation

## 2022-04-04 DIAGNOSIS — I779 Disorder of arteries and arterioles, unspecified: Secondary | ICD-10-CM | POA: Insufficient documentation

## 2022-04-04 DIAGNOSIS — Z79899 Other long term (current) drug therapy: Secondary | ICD-10-CM | POA: Insufficient documentation

## 2022-04-04 DIAGNOSIS — N181 Chronic kidney disease, stage 1: Secondary | ICD-10-CM

## 2022-04-04 DIAGNOSIS — Z7982 Long term (current) use of aspirin: Secondary | ICD-10-CM | POA: Insufficient documentation

## 2022-04-04 DIAGNOSIS — E1122 Type 2 diabetes mellitus with diabetic chronic kidney disease: Secondary | ICD-10-CM

## 2022-04-04 DIAGNOSIS — Z794 Long term (current) use of insulin: Secondary | ICD-10-CM

## 2022-04-04 DIAGNOSIS — T8453XA Infection and inflammatory reaction due to internal right knee prosthesis, initial encounter: Secondary | ICD-10-CM | POA: Insufficient documentation

## 2022-04-04 DIAGNOSIS — Z8673 Personal history of transient ischemic attack (TIA), and cerebral infarction without residual deficits: Secondary | ICD-10-CM | POA: Insufficient documentation

## 2022-04-04 DIAGNOSIS — I890 Lymphedema, not elsewhere classified: Secondary | ICD-10-CM | POA: Diagnosis not present

## 2022-04-04 NOTE — Progress Notes (Signed)
[facility] - HEART FAILURE CLINIC - PHARMACIST COUNSELING NOTE  Guideline-Directed Medical Therapy/Evidence Based Medicine  ACE/ARB/ARNI: Losartan 25 mg daily Beta Blocker: Metoprolol succinate 25 mg daily Aldosterone Antagonist: Spironolactone 12.5 mg daily Diuretic: Furosemide 40 mg BID and metolazone 2.5 mg PRN SGLT2i: None  Adherence Assessment  Do you ever forget to take your medication? []Yes []No  Do you ever skip doses due to side effects? []Yes []No  Do you have trouble affording your medicines? []Yes []No  Are you ever unable to pick up your medication due to transportation difficulties? []Yes []No  Do you ever stop taking your medications because you don't believe they are helping? []Yes []No  Do you check your weight daily? []Yes []No   Adherence strategy: associates administration with time  Barriers to obtaining medications: none  Vital signs: HR 65, BP 131/47, weight (pounds) 189 lb ECHO: Date 08/10/21, EF 30-35%, notes moderate LAE      Latest Ref Rng & Units 01/17/2022   11:05 AM 12/06/2021    3:10 PM 11/21/2021    4:52 AM  BMP  Glucose 70 - 99 mg/dL 90  120  100   BUN 8 - 23 mg/dL 38  32  32   Creatinine 0.61 - 1.24 mg/dL 0.96  1.03  1.07   Sodium 135 - 145 mmol/L 138  136  129   Potassium 3.5 - 5.1 mmol/L 4.2  4.4  4.6   Chloride 98 - 111 mmol/L 99  101  91   CO2 22 - 32 mmol/L 31  28  27   Calcium 8.9 - 10.3 mg/dL 9.0  9.0  8.8     Past Medical History:  Diagnosis Date   Anginal pain (HCC)    Aortic atherosclerosis (HCC)    Arthritis    BPH (benign prostatic hyperplasia)    CAD (coronary artery disease)    a.) 2/22 Cath: LM 50d, LAD sev ost dz, mod-sev prox/mid dz. D1 sev dz. LCX mild prox dzs, OM2 mod-sev dz, RCA 100p. RPL and RPDA fill via L-R collats. EF 35%. Seen by CVTS->Med mgmt. b.) R/LHC 08/14/21: 30% m-d-LM, 85% oLAD, 50% pLAD, 35% dLAD, 80% D1, 50% pLCx, 70% OM1, 100% p-dRCA -> trans to [facility]. c.) 08/15/21 Impella  supported HIGH RISK PCI with atherectomy. 2.75 x 26mm Onyx Frontier DES oLAD.   Carotid artery stenosis    a. 11/2010 U/S: <50% bilaterally; b. 03/2021 40-59% bilat ICA stenoses.   COPD (chronic obstructive pulmonary disease) (HCC)    noted CT 10/05/16 former smoker quit age 35    CVA (cerebral infarction) 11/2010   a.) RIGHT thalamic lacunar   GERD (gastroesophageal reflux disease)    Heart murmur    HFrEF (heart failure with reduced ejection fraction) (HCC)    a. 10/2020 Echo: EF 35%; b. 10/2021 Echo: EF 30-35%, mod reduced RV fxn, mod dil LA.   Hyperlipidemia    Hypertension    Ischemic cardiomyopathy    a. 10/2020 Echo: EF 35%. Nl RVSP. Mod MR. Mild TR; b. 11/2020 Cath:  CO/CI 5.05/2.49. LV gram: EF 35%; c. 10/2021 Echo: EF 30-35%, mod reduced RV fxn, mod dil LA.   Lower extremity cellulitis    NSTEMI (non-ST elevated myocardial infarction) (HCC) 08/09/2021   a.) R/LHC 08/14/2021: 30% m-d-LM, 85% oLAD, 50% pLAD, 35% dLAD, 80% D1, 50% pLCx, 70% OM1, 100% p-dRCA -> transfer to [facility]. b.) 08/15/2021 Impella supported HIGH RISK PCI with atherectomy. 2.75 x 26mm Onyx Frontier DES to   oLAD.   Peripheral vascular disease in diabetes mellitus (HCC)    a. 06/2012 95% occlusion s/p PTA R SFA (Dr. Dew); b. 03/2021 ABIs: R 1.02, L 0.96.   Pneumonia    Type 2 diabetes mellitus treated with insulin (HCC)     ASSESSMENT 88 year old male who presents to the HF clinic for follow up. PMH relevant to CAD, ICM, HFrEF, HTN, HLD, T2DM, GERD, CVA, PAD. GDMT includes losartan 25 mg daily, spironolactone 12.5 mg daily, and metoprolol succiante 25 mg daily. Reports doing well overall and offers no complaints.     PLAN Reconciled medications Consider adding SGLT2i in future, however ensure bp toelrable and cautious for risk of orthostasis given advanced age.  Time spent: 15 minutes  Caroline A Childs, PharmD Pharmacy Resident  04/04/2022 3:47 PM  Current Outpatient Medications:    albuterol (VENTOLIN HFA) 108  (90 Base) MCG/ACT inhaler, Inhale 2 puffs into the lungs every 6 (six) hours as needed for shortness of breath or wheezing., Disp: , Rfl:    amiodarone (PACERONE) 200 MG tablet, Take 200 mg by mouth daily. Not taking currently as it may cause rash, Disp: , Rfl:    ascorbic acid (VITAMIN C) 500 MG tablet, Take 500 mg by mouth daily. , Disp: , Rfl:    aspirin EC 81 MG tablet, Take 1 tablet (81 mg total) by mouth daily. Swallow whole. (Patient not taking: Reported on 04/04/2022), Disp: 90 tablet, Rfl: 3   Blood Glucose Monitoring Suppl (ONE TOUCH ULTRA SYSTEM KIT) w/Device KIT, 1 kit by Does not apply route once. Use DX code E11.59, Disp: 1 each, Rfl: 0   cholecalciferol (VITAMIN D) 25 MCG (1000 UNIT) tablet, Take 1,000 Units by mouth daily., Disp: , Rfl:    clopidogrel (PLAVIX) 75 MG tablet, TAKE 1 TABLET(75 MG) BY MOUTH DAILY WITH BREAKFAST, Disp: 30 tablet, Rfl: 4   co-enzyme Q-10 30 MG capsule, Take 100 mg by mouth daily., Disp: , Rfl:    diclofenac Sodium (VOLTAREN) 1 % GEL, Apply 2 g topically 2 (two) times daily as needed (pain)., Disp: , Rfl:    docusate sodium (COLACE) 100 MG capsule, Take 1 capsule (100 mg total) by mouth 2 (two) times daily., Disp: 30 capsule, Rfl: 0   doxycycline (VIBRA-TABS) 100 MG tablet, Take 1 tablet (100 mg total) by mouth 2 (two) times daily., Disp: 60 tablet, Rfl: 5   ezetimibe (ZETIA) 10 MG tablet, Take 1 tablet (10 mg total) by mouth daily., Disp: 90 tablet, Rfl: 3   furosemide (LASIX) 40 MG tablet, Take 1 tablet (40 mg total) by mouth 2 (two) times daily. (Patient taking differently: Take 40 mg by mouth 2 (two) times daily. Taking once daily), Disp: 60 tablet, Rfl: 5   gabapentin (NEURONTIN) 400 MG capsule, Take 400 mg by mouth 3 (three) times daily. Taking BID, Disp: , Rfl:    glipiZIDE (GLUCOTROL) 10 MG tablet, Take 10 mg by mouth daily before breakfast., Disp: , Rfl:    HYDROcodone-acetaminophen (NORCO/VICODIN) 5-325 MG tablet, Take 1 tablet by mouth every 4  (four) hours as needed for moderate pain (pain score 4-6)., Disp: 30 tablet, Rfl: 0   INS SYRINGE/NEEDLE 1CC/28G (B-D INSULIN SYRINGE 1CC/28G) 28G X 1/2" 1 ML MISC, USE TO ADMINISTER INSULIN DAILY, Disp: 100 each, Rfl: 0   insulin aspart protamine- aspart (NOVOLOG MIX 70/30) (70-30) 100 UNIT/ML injection, Inject 0.15 mLs (15 Units total) into the skin 2 (two) times daily with a meal., Disp: 10 mL, Rfl:   11   latanoprost (XALATAN) 0.005 % ophthalmic solution, Place 1 drop into both eyes at bedtime., Disp: , Rfl: 5   losartan (COZAAR) 25 MG tablet, Take 0.5 tablets (12.5 mg total) by mouth daily., Disp: 30 tablet, Rfl: 5   metolazone (ZAROXOLYN) 2.5 MG tablet, Take 1 tablet (2.5 mg total) by mouth as needed (As needed for weight gain)., Disp: 90 tablet, Rfl: 3   metoprolol succinate (TOPROL XL) 25 MG 24 hr tablet, Take 1 tablet (25 mg total) by mouth daily., Disp: 90 tablet, Rfl: 3   Multiple Vitamin (MULTIVITAMIN WITH MINERALS) TABS tablet, Take 1 tablet by mouth in the morning., Disp: , Rfl:    nitroGLYCERIN (NITROSTAT) 0.4 MG SL tablet, Place 1 tablet (0.4 mg total) under the tongue every 5 (five) minutes as needed for chest pain. MAXIMUM 3 TABLETS, Disp: 50 tablet, Rfl: 3   Omega-3 Fatty Acids (FISH OIL) 1000 MG CAPS, Take 1,000 mg by mouth daily at 6 (six) AM., Disp: , Rfl:    ONE TOUCH ULTRA TEST test strip, USE 1 STRIP TO TEST THREE TIMES DAILY AS DIRECTED, Disp: 100 each, Rfl: 0   ONETOUCH DELICA LANCETS 94W MISC, Use three times daily to check blood sugar., Disp: 100 each, Rfl: 11   oxyCODONE (OXY IR/ROXICODONE) 5 MG immediate release tablet, Take 5 mg by mouth every 4 (four) hours as needed for severe pain., Disp: , Rfl:    pantoprazole (PROTONIX) 40 MG tablet, Take 40 mg by mouth daily., Disp: , Rfl:    potassium chloride SA (KLOR-CON M) 20 MEQ tablet, Take 1 tablet (20 mEq total) by mouth 2 (two) times daily. Take with furosemide, Disp: 60 tablet, Rfl: 5   rifampin (RIFADIN) 300 MG capsule,  Take 1 capsule (300 mg total) by mouth every 12 (twelve) hours., Disp: 60 capsule, Rfl: 1   rosuvastatin (CRESTOR) 10 MG tablet, Take 1 tablet (10 mg total) by mouth daily., Disp: 90 tablet, Rfl: 3   spironolactone (ALDACTONE) 25 MG tablet, Take 0.5 tablets (12.5 mg total) by mouth daily., Disp: 15 tablet, Rfl: 11   tamsulosin (FLOMAX) 0.4 MG CAPS capsule, Take 0.4 mg by mouth daily after breakfast., Disp: , Rfl:    timolol (TIMOPTIC) 0.5 % ophthalmic solution, Place 1 drop into both eyes in the morning., Disp: , Rfl: 2   vitamin B-12 (CYANOCOBALAMIN) 500 MCG tablet, Take 500 mcg by mouth daily., Disp: , Rfl:    vitamin E 180 MG (400 UNITS) capsule, Take 400 Units by mouth daily., Disp: , Rfl:    COUNSELING POINTS/CLINICAL PEARLS   DRUGS TO CAUTION IN HEART FAILURE  Drug or Class Mechanism  Analgesics NSAIDs COX-2 inhibitors Glucocorticoids  Sodium and water retention, increased systemic vascular resistance, decreased response to diuretics   Diabetes Medications Metformin Thiazolidinediones Rosiglitazone (Avandia) Pioglitazone (Actos) DPP4 Inhibitors Saxagliptin (Onglyza) Sitagliptin (Januvia)   Lactic acidosis Possible calcium channel blockade   Unknown  Antiarrhythmics Class I  Flecainide Disopyramide Class III Sotalol Other Dronedarone  Negative inotrope, proarrhythmic   Proarrhythmic, beta blockade  Negative inotrope  Antihypertensives Alpha Blockers Doxazosin Calcium Channel Blockers Diltiazem Verapamil Nifedipine Central Alpha Adrenergics Moxonidine Peripheral Vasodilators Minoxidil  Increases renin and aldosterone  Negative inotrope    Possible sympathetic withdrawal  Unknown  Anti-infective Itraconazole Amphotericin B  Negative inotrope Unknown  Hematologic Anagrelide Cilostazol   Possible inhibition of PD IV Inhibition of PD III causing arrhythmias  Neurologic/Psychiatric Stimulants Anti-Seizure  Drugs Carbamazepine Pregabalin Antidepressants Tricyclics Citalopram Parkinsons Bromocriptine Pergolide Pramipexole Antipsychotics  Clozapine Antimigraine Ergotamine Methysergide Appetite suppressants Bipolar Lithium  Peripheral alpha and beta agonist activity  Negative inotrope and chronotrope Calcium channel blockade  Negative inotrope, proarrhythmic Dose-dependent QT prolongation  Excessive serotonin activity/valvular damage Excessive serotonin activity/valvular damage Unknown  IgE mediated hypersensitivy, calcium channel blockade  Excessive serotonin activity/valvular damage Excessive serotonin activity/valvular damage Valvular damage  Direct myofibrillar degeneration, adrenergic stimulation  Antimalarials Chloroquine Hydroxychloroquine Intracellular inhibition of lysosomal enzymes  Urologic Agents Alpha Blockers Doxazosin Prazosin Tamsulosin Terazosin  Increased renin and aldosterone  Adapted from Page RL, et al. "Drugs That May Cause or Exacerbate Heart Failure: A Scientific Statement from the American Heart  Association." Circulation 2016; 134:e32-e69. DOI: 10.1161/CIR.0000000000000426   MEDICATION ADHERENCES TIPS AND STRATEGIES Taking medication as prescribed improves patient outcomes in heart failure (reduces hospitalizations, improves symptoms, increases survival) Side effects of medications can be managed by decreasing doses, switching agents, stopping drugs, or adding additional therapy. Please let someone in the Heart Failure Clinic know if you have having bothersome side effects so we can modify your regimen. Do not alter your medication regimen without talking to us.  Medication reminders can help patients remember to take drugs on time. If you are missing or forgetting doses you can try linking behaviors, using pill boxes, or an electronic reminder like an alarm on your phone or an app. Some people can also get automated phone calls as medication  reminders.   

## 2022-04-04 NOTE — Patient Instructions (Addendum)
Continue weighing daily and call for an overnight weight gain of 3 pounds or more or a weekly weight gain of more than 5 pounds.   If you have voicemail, please make sure your mailbox is cleaned out so that we may leave a message and please make sure to listen to any voicemails.    If you receive a satisfaction survey regarding the Heart Failure Clinic, please take the time to fill it out. This way we can continue to provide excellent care and make any changes that need to be made.    Call us in the future if you need us for anything 

## 2022-04-04 NOTE — Progress Notes (Signed)
Patient ID: Henry Lindsey, male    DOB: February 05, 1933, 88 y.o.   MRN: [id number]  HPI  Henry Lindsey is a 88 y.o. male with a history of CAD, ICM, HFrEF, HTN, HL, DMII, GERD, CVA, PAD, and carotid arterial disease.  Echo report from 11/16/21 reviewed and showed an EF of 30-35% along with moderate LAE.  Echo 08/10/21: Left Ventricle: Left ventricular ejection fraction, by estimation, is 25 to 30%. The left ventricle has severely decreased function. The left ventricle demonstrates global hypokinesis. The average left ventricular global longitudinal strain is -6.3 %. The global longitudinal strain is abnormal. The left ventricular internal  cavity size was normal in size. There is no left ventricular hypertrophy. Left ventricular diastolic parameters are consistent with Grade II diastolic dysfunction (pseudonormalization).   Right Ventricle: The right ventricular size is moderately enlarged. No increase in right ventricular wall thickness. Right ventricular systolic function is moderately reduced. There is mildly elevated pulmonary artery systolic pressure. The tricuspid  regurgitant velocity is 2.58 m/s, and with an assumed right atrial  pressure of 15 mmHg, the estimated right ventricular systolic pressure is 33.3 mmHg.   LHC 08/15/21:   Mid LM to Dist LM lesion is 30% stenosed.   Ost LAD lesion is 85% stenosed.   Prox LAD lesion is 50% stenosed.   Dist LAD lesion is 35% stenosed.   Prox Cx lesion is 50% stenosed.   Prox RCA to Dist RCA lesion is 100% stenosed.   1st Diag lesion is 80% stenosed.   1st Mrg lesion is 70% stenosed.   A drug-eluting stent was successfully placed using a STENT ONYX FRONTIER 2.75X26.   Post intervention, there is a 0% residual stenosis.   Post intervention, there is a 0% residual stenosis.   Post intervention, there is a 0% residual stenosis.   Successful Impella supported high risk PCI of the ostial LAD with atherectomy and drug-eluting stent placement.  The  stent extended 1 to 2 mm into the left main coronary artery to ensure coverage of the ostium.  There was normal flow into the  left circumflex.Moderately elevated left ventricular end-diastolic pressure at 24 mmHg at the beginning of the case.  Admitted 11/15/21 for acute on chronic HF, right pleural effusion and subsequent thoracentesis  He presents today for a follow-up visit with a chief complaint of minimal fatigue upon moderate exertion. Describes this as chronic in nature having been present for several years. Has associated light-headedness, numbness/pain in fingertips and gradual weight gain along with this. Denies any difficulty sleeping, abdominal distention, palpitations, pedal edema, chest pain, shortness of breath or cough.   Says that he's resumed lifting weights using barbells and feels like his weight gain is at least partially due to re-building his muscle mass.   Past Medical History:  Diagnosis Date   Anginal pain ([place])    Aortic atherosclerosis ([facility])    Arthritis    BPH (benign prostatic hyperplasia)    CAD (coronary artery disease)    a.) 2/22 Cath: LM 50d, LAD sev ost dz, mod-sev prox/mid dz. D1 sev dz. LCX mild prox dzs, OM2 mod-sev dz, RCA 100p. RPL and RPDA fill via L-R collats. EF 35%. Seen by CVTS->Med mgmt. b.) R/LHC 08/14/21: 30% m-d-LM, 85% oLAD, 50% pLAD, 35% dLAD, 80% D1, 50% pLCx, 70% OM1, 100% p-dRCA -> trans to [facility]. c.) 08/15/21 Impella supported HIGH RISK PCI with atherectomy. 2.75 x 65m Onyx Frontier DES oLAD.   Carotid artery stenosis  a. 11/2010 U/S: <50% bilaterally; b. 03/2021 40-59% bilat ICA stenoses.   COPD (chronic obstructive pulmonary disease) ([place])    noted CT 10/05/16 former smoker quit age 74    CVA (cerebral infarction) 11/2010   a.) RIGHT thalamic lacunar   GERD (gastroesophageal reflux disease)    Heart murmur    HFrEF (heart failure with reduced ejection fraction) ([place])    a. 10/2020 Echo: EF 35%; b. 10/2021 Echo: EF 30-35%, mod reduced RV  fxn, mod dil LA.   Hyperlipidemia    Hypertension    Ischemic cardiomyopathy    a. 10/2020 Echo: EF 35%. Nl RVSP. Mod MR. Mild TR; b. 11/2020 Cath:  CO/CI 5.05/2.49. LV gram: EF 35%; c. 10/2021 Echo: EF 30-35%, mod reduced RV fxn, mod dil LA.   Lower extremity cellulitis    NSTEMI (non-ST elevated myocardial infarction) ([place]) 08/09/2021   a.) R/LHC 08/14/2021: 30% m-d-LM, 85% oLAD, 50% pLAD, 35% dLAD, 80% D1, 50% pLCx, 70% OM1, 100% p-dRCA -> transfer to [facility]. b.) 08/15/2021 Impella supported HIGH RISK PCI with atherectomy. 2.75 x 29m Onyx Frontier DES to [employer]   Peripheral vascular disease in diabetes mellitus ([place]    a. 06/2012 95% occlusion s/p PTA R SFA (Dr. DLucky Cowboy; b. 03/2021 ABIs: R 1.02, L 0.96.   Pneumonia    Type 2 diabetes mellitus treated with insulin ([facility]    Past Surgical History:  Procedure Laterality Date   ABDOMINAL AORTOGRAM N/A 08/14/2021   Procedure: ABDOMINAL AORTOGRAM;  Surgeon: ENelva Bush MD;  [facility];  Service: Cardiovascular;  Laterality: N/A;   CHOLECYSTECTOMY N/A 11/13/2016   Procedure: LAPAROSCOPIC CHOLECYSTECTOMY WITH INTRAOPERATIVE CHOLANGIOGRAM;  Surgeon: JOlean Ree MD;  Location: [facility];  Service: General;  Laterality: N/A;   COLONOSCOPY WITH PROPOFOL N/A 02/10/2018   Procedure: COLONOSCOPY WITH PROPOFOL;  Surgeon: WLucilla Lame MD;  Location: [facility];  Service: Endoscopy;  Laterality: N/A;   CORONARY ATHERECTOMY N/A 08/15/2021   Procedure: CORONARY ATHERECTOMY;  Surgeon: AWellington Hampshire MD;  [facility];  Service: Cardiovascular;  Laterality: N/A;   CORONARY STENT INTERVENTION W/IMPELLA N/A 08/15/2021   Procedure: Coronary Stent Intervention w/Impella;  Surgeon: AWellington Hampshire MD;  [facility];  Service: Cardiovascular;  Laterality: N/A;   ERCP N/A 11/17/2016   Procedure: ENDOSCOPIC RETROGRADE CHOLANGIOPANCREATOGRAPHY (ERCP);  Surgeon: JIrene Shipper MD;  [facility];   Service: Endoscopy;  Laterality: N/A;   ERCP N/A 02/04/2017   Procedure: ENDOSCOPIC RETROGRADE CHOLANGIOPANCREATOGRAPHY (ERCP) Stent removal;  Surgeon: DLucilla Lame MD;  Location: [facility];  Service: Endoscopy;  Laterality: N/A;   IR GENERIC HISTORICAL  10/06/2016   IR PERC CHOLECYSTOSTOMY 10/06/2016 GAletta Edouard MD [facility]   JOINT REPLACEMENT Left 2014   left knee   RIGHT/LEFT HEART CATH AND CORONARY ANGIOGRAPHY N/A 11/24/2020   Procedure: RIGHT/LEFT HEART CATH AND CORONARY ANGIOGRAPHY;  Surgeon: GMinna Merritts MD;  [facility];  Service: Cardiovascular;  Laterality: N/A;   RIGHT/LEFT HEART CATH AND CORONARY ANGIOGRAPHY N/A 08/14/2021   Procedure: RIGHT/LEFT HEART CATH AND CORONARY ANGIOGRAPHY;  Surgeon: ENelva Bush MD;  [facility];  Service: Cardiovascular;  Laterality: N/A;   TOTAL KNEE ARTHROPLASTY Right 08/08/2020   Procedure: TOTAL KNEE ARTHROPLASTY;  Surgeon: BLovell Sheehan MD;  Location: [facility];  Service: Orthopedics;  Laterality: Right;   TOTAL KNEE REVISION Right 10/01/2021   Procedure: IRRIGATION AND DEBRIDMENT RIGHT KNEE WITH POLY EXCHANGE;  Surgeon: BKurtis Bushman  R, MD;  Location: [facility];  Service: Orthopedics;  Laterality: Right;   UPPER GASTROINTESTINAL ENDOSCOPY     WRIST SURGERY     Family History  Problem Relation Age of Onset   Diabetes Mother    Social History   Tobacco Use   Smoking status: Former    Packs/day: 0.50    Years: 33.00    Total pack years: 16.50    Types: Cigarettes    Quit date: 04/21/1968    Years since quitting: 53.9   Smokeless tobacco: Never  Substance Use Topics   Alcohol use: No   Allergies  Allergen Reactions   Keflex [Cephalexin] Rash   Jardiance [Empagliflozin]     Rash all over and lips turn purple.    Metformin And Related Diarrhea   Trazodone Other (See Comments)    Extreme hallucinations/ psychosis    Torsemide Rash    "Skin reaction"   Prior to Admission  medications   Medication Sig Start Date End Date Taking? Authorizing Provider  albuterol (VENTOLIN HFA) 108 (90 Base) MCG/ACT inhaler Inhale 2 puffs into the lungs every 6 (six) hours as needed for shortness of breath or wheezing. 10/29/20  Yes [provider]  amiodarone (PACERONE) 200 MG tablet Take 200 mg by mouth daily. Not taking currently as it may cause rash   Yes [provider]  ascorbic acid (VITAMIN C) 500 MG tablet Take 500 mg by mouth daily.    Yes [provider]  Blood Glucose Monitoring Suppl (ONE TOUCH ULTRA SYSTEM KIT) w/Device KIT 1 kit by Does not apply route once. Use DX code E11.59 10/18/15  Yes [name], MD  cholecalciferol (VITAMIN D) 25 MCG (1000 UNIT) tablet Take 1,000 Units by mouth daily.   Yes [provider]  clopidogrel (PLAVIX) 75 MG tablet TAKE 1 TABLET(75 MG) BY MOUTH DAILY WITH BREAKFAST 02/04/22  Yes Gollan, Kathlene November, MD  co-enzyme Q-10 30 MG capsule Take 100 mg by mouth daily.   Yes [provider]  diclofenac Sodium (VOLTAREN) 1 % GEL Apply 2 g topically 2 (two) times daily as needed (pain).   Yes [provider]  docusate sodium (COLACE) 100 MG capsule Take 1 capsule (100 mg total) by mouth 2 (two) times daily. 10/04/21  Yes Carlynn Spry, PA-C  doxycycline (VIBRA-TABS) 100 MG tablet Take 1 tablet (100 mg total) by mouth 2 (two) times daily. 12/31/21  Yes Tommy Medal, Lavell Islam, MD  ezetimibe (ZETIA) 10 MG tablet Take 1 tablet (10 mg total) by mouth daily. 03/05/22  Yes Gollan, Kathlene November, MD  furosemide (LASIX) 40 MG tablet Take 1 tablet (40 mg total) by mouth 2 (two) times daily. Patient taking differently: Take 40 mg by mouth 2 (two) times daily. Taking once daily 10/19/21  Yes Prima Rayner, Otila Kluver A, FNP  gabapentin (NEURONTIN) 400 MG capsule Take 400 mg by mouth 3 (three) times daily. Taking BID   Yes [provider]  glipiZIDE (GLUCOTROL) 10 MG tablet Take 10 mg by mouth daily before breakfast.   Yes  [provider]  HYDROcodone-acetaminophen (NORCO/VICODIN) 5-325 MG tablet Take 1 tablet by mouth every 4 (four) hours as needed for moderate pain (pain score 4-6). 10/04/21  Yes Carlynn Spry, PA-C  INS SYRINGE/NEEDLE 1CC/28G (B-D INSULIN SYRINGE 1CC/28G) 28G X 1/2" 1 ML MISC USE TO ADMINISTER INSULIN DAILY 02/13/16  Yes [name], MD  insulin aspart protamine- aspart (NOVOLOG MIX 70/30) (70-30) 100 UNIT/ML injection Inject 0.15 mLs (15 Units  total) into the skin 2 (two) times daily with a meal. 08/14/21  Yes Fritzi Mandes, MD  latanoprost (XALATAN) 0.005 % ophthalmic solution Place 1 drop into both eyes at bedtime. 11/01/15  Yes [provider]  losartan (COZAAR) 25 MG tablet Take 0.5 tablets (12.5 mg total) by mouth daily. 10/30/21  Yes Gurjit Loconte, Otila Kluver A, FNP  metolazone (ZAROXOLYN) 2.5 MG tablet Take 1 tablet (2.5 mg total) by mouth as needed (As needed for weight gain). 01/31/22 05/01/22 Yes Theora Gianotti, NP  metoprolol succinate (TOPROL XL) 25 MG 24 hr tablet Take 1 tablet (25 mg total) by mouth daily. 11/26/21  Yes Minna Merritts, MD  Multiple Vitamin (MULTIVITAMIN WITH MINERALS) TABS tablet Take 1 tablet by mouth in the morning.   Yes [provider]  nitroGLYCERIN (NITROSTAT) 0.4 MG SL tablet Place 1 tablet (0.4 mg total) under the tongue every 5 (five) minutes as needed for chest pain. MAXIMUM 3 TABLETS 12/08/20  Yes Gollan, Kathlene November, MD  Omega-3 Fatty Acids (FISH OIL) 1000 MG CAPS Take 1,000 mg by mouth daily at 6 (six) AM.   Yes [provider]  ONE TOUCH ULTRA TEST test strip USE 1 STRIP TO TEST THREE TIMES DAILY AS DIRECTED 08/31/18  Yes [name], MD  [facility] DELICA LANCETS 74J MISC Use three times daily to check blood sugar. 10/18/15  Yes [name], MD  oxyCODONE (OXY IR/ROXICODONE) 5 MG immediate release tablet Take 5 mg by mouth every 4 (four) hours as needed for severe pain.   Yes [provider]  pantoprazole  (PROTONIX) 40 MG tablet Take 40 mg by mouth daily.   Yes [provider]  potassium chloride SA (KLOR-CON M) 20 MEQ tablet Take 1 tablet (20 mEq total) by mouth 2 (two) times daily. Take with furosemide 11/13/21  Yes Kyrah Schiro A, FNP  rifampin (RIFADIN) 300 MG capsule Take 1 capsule (300 mg total) by mouth every 12 (twelve) hours. 11/21/21  Yes Wouk, Ailene Rud, MD  rosuvastatin (CRESTOR) 10 MG tablet Take 1 tablet (10 mg total) by mouth daily. 03/05/22  Yes Minna Merritts, MD  spironolactone (ALDACTONE) 25 MG tablet Take 0.5 tablets (12.5 mg total) by mouth daily. 08/21/21  Yes Bhagat, Bhavinkumar, PA  tamsulosin (FLOMAX) 0.4 MG CAPS capsule Take 0.4 mg by mouth daily after breakfast.   Yes [provider]  timolol (TIMOPTIC) 0.5 % ophthalmic solution Place 1 drop into both eyes in the morning. 12/19/17  Yes [provider]  vitamin B-12 (CYANOCOBALAMIN) 500 MCG tablet Take 500 mcg by mouth daily.   Yes [provider]  vitamin E 180 MG (400 UNITS) capsule Take 400 Units by mouth daily.   Yes [provider]  aspirin EC 81 MG tablet Take 1 tablet (81 mg total) by mouth daily. Swallow whole. Patient not taking: Reported on 04/04/2022 01/31/21   Minna Merritts, MD   Review of Systems  Constitutional:  Positive for fatigue (minimal). Negative for appetite change.  HENT:  Negative for congestion, postnasal drip and sore throat.   Eyes: Negative.   Respiratory:  Negative for cough, chest tightness and shortness of breath.   Cardiovascular:  Negative for chest pain, palpitations and leg swelling.  Gastrointestinal:  Negative for abdominal distention and abdominal pain.  Endocrine: Negative.   Genitourinary: Negative.   Musculoskeletal:  Positive for arthralgias (fingers). Negative for back pain and neck pain.  Skin:  Positive for rash.  Allergic/Immunologic: Negative.   Neurological:  Positive for light-headedness and numbness (fingertips). Negative  for dizziness.  Hematological:  Negative for adenopathy. Bruises/bleeds easily.  Psychiatric/Behavioral:  Negative for dysphoric mood and sleep disturbance. The patient is not nervous/anxious.    Vitals:   04/04/22 1421  BP: (!) 131/47  Pulse: 65  Resp: 18  SpO2: 93%  Weight: 189 lb (85.7 kg)  Height: _0  (1.753 m)   Wt Readings from Last 3 Encounters:  04/04/22 189 lb (85.7 kg)  02/14/22 178 lb (80.7 kg)  01/31/22 178 lb (80.7 kg)   Lab Results  Component Value Date   CREATININE 0.96 01/17/2022   CREATININE 1.03 12/06/2021   CREATININE 1.07 11/21/2021   Physical Exam Vitals and nursing note reviewed. Exam conducted with a chaperone present (SO).  Constitutional:      Appearance: Normal appearance.  HENT:     Head: Normocephalic and atraumatic.  Cardiovascular:     Rate and Rhythm: Normal rate and regular rhythm.  Pulmonary:     Effort: Pulmonary effort is normal. No respiratory distress.     Breath sounds: No wheezing, rhonchi or rales.  Abdominal:     General: There is no distension.     Palpations: Abdomen is soft.  Musculoskeletal:        General: No tenderness.     Cervical back: Normal range of motion and neck supple.     Right lower leg: No edema.     Left lower leg: No edema.  Skin:    General: Skin is warm and dry.  Neurological:     General: No focal deficit present.     Mental Status: He is alert and oriented to person, place, and time.  Psychiatric:        Mood and Affect: Mood normal.        Behavior: Behavior normal.        Thought Content: Thought content normal.    Assessment & Plan: 1: Chronic Heart failure with reduced ejection fraction- - NYHA II - euvolemic - weighing daily; reminded to call for an overnight weight gain of > 2 pounds or a weekly weight gain of > 5 pounds - weight up 18 pounds from last visit here 3 months ago; says it's because he's been re-building his muscle mass - on GDMT of metoprolol succinate, on losartan  -  Entresto previously stopped due to hypotension - allergic to Jardiance - BP historically soft; today is  - O2 @ 2L QHS  - BNP 11/15/20 was 2065.7 - PharmD reconciled medications with the patient  2.  CAD- - DES to ostial LAD - ASA, plavix - saw cardiology Sharolyn Douglas) 01/31/22   3.  DM- - A1c 03/26/22 was 7.3% - BMP 03/26/22 reviewed and showed sodium 143, potassium 4.7, creatinine 1.4 and GFR 48 - saw PCP Edwina Barth) 04/02/22 - glucose at home this morning was 113  4. Lymphedema- - resolved - normally wears compression socks but didn't put them on today  5: Prosthetic joint infection- - saw ID (Ravishankar) 02/14/22 - continues on antibiotics   Medication list reviewed.   Due to HF stability, will not make another appointment at this time. Advised to f/u closely with cardiology and PCP but that he could call back anytime to make another appointment. He was comfortable with this plan.

## 2022-04-05 ENCOUNTER — Encounter: Payer: Self-pay | Admitting: Family

## 2022-04-08 ENCOUNTER — Encounter: Payer: Self-pay | Admitting: Cardiovascular Disease

## 2022-04-16 ENCOUNTER — Other Ambulatory Visit: Payer: Self-pay | Admitting: Cardiovascular Disease

## 2022-05-02 NOTE — Progress Notes (Signed)
Cardiology Office Note  Date:  05/03/2022   ID:  Kagan, Mutchler 1933/05/18, MRN [id number]  PCP:  Baxter Hire, MD   Chief Complaint  Patient presents with   Other    3 month f/u no complaints today. Meds reviewed verbally with pt.    HPI:  Henry Lindsey is a 88 y.o. male with a history of  hyperlipidemia,  peripheral vascular disease with  angioplasty of the distal right SFA by Dr.  Lucky Cowboy,  50% carotid disease bilaterally, history of poorly controlled diabetes,  history of stroke with minimal residual left sided deficits,  CT scan 10/05/2016 Moderate diffuse aortic atherosclerosis Moderate diffuse three-vessel coronary disease Prior smoker, stopped in 1969 Presents for f/u of his CAD, CHF  Last seen in clinic by myself April 2023 On today's visit reports he is doing well overall reports he is back on his Jardiance 25 mg daily Previously held for rash, now actually reports it might be helping his rash Still with rash on legs but rash on arms and face is improving as he has been on the Jardiance  Restarted exercise, lots of weightlifting, proud of his biceps muscles weight up 8 pounds since 11/2021 Does not feel he has extra fluid, no leg swelling no abdominal distention Denies shortness of breath  Labs CR 1.4 BUN 43 A1C 7.3  Taking lasix 40 mg daily   hospitalization for CHF 1/23 Ef 30 to 35%  EKG personally reviewed by myself on todays visit Nsr rate 56 bpm old anterio mi  Echo 08/10/21  1. Left ventricular ejection fraction, by estimation, is 25 to 30%. The  left ventricle has severely decreased function. The left ventricle  demonstrates global hypokinesis with severe hypokinesis of the anterior,  septal, and apical region. Inferior wall  best preserved. Left ventricular diastolic parameters are consistent with  Grade II diastolic dysfunction (pseudonormalization). The average left  ventricular global longitudinal strain is -6.3 %. The global longitudinal   strain is abnormal.   2. Right ventricular systolic function is moderately reduced. The right  ventricular size is moderately enlarged. There is mildly elevated  pulmonary artery systolic pressure. The estimated right ventricular  systolic pressure is 33.4 mmHg.  Moderate to severe mitral valve regurgitation.   Cardiac cath 08/15/21 Successful Impella supported high risk PCI of the ostial LAD with atherectomy and drug-eluting stent placement.  The stent extended 1 to 2 mm into the left main coronary artery to ensure coverage of the ostium.  There was normal flow into the  left circumflex. Moderately elevated left ventricular end-diastolic pressure at 24 mmHg at the beginning of the case.   Dual antiplatelet therapy for at least 12 months and preferably longer.  admitted from our office on 11/15/21 with acute on chronic combined CHF and electrolyte derangements after failing outpatient diuresis.  weight was up 22 pounds compared to September 2022 Status post right-sided thoracentesis 1/29 with 1.7 L of fluid removed Frequent PVCs Discharge November 21, 2021 Lasix 40 twice daily metolazone twice a week  Echo 11/16/21  1. Left ventricular ejection fraction, by estimation, is 30 to 35%. The  left ventricle has moderately decreased function. The left ventricle  demonstrates global hypokinesis. The left ventricular internal cavity size  was moderately dilated.   2. Right ventricular systolic function is moderately reduced. The right  ventricular size is moderately enlarged. Tricuspid regurgitation signal is  inadequate for assessing PA pressure.   3. Left atrial size was moderately dilated.  4. The mitral valve is normal in structure. No evidence of mitral valve  regurgitation. No evidence of mitral stenosis.   5. The aortic valve is normal in structure. Aortic valve regurgitation is  not visualized. No aortic stenosis is present.   6. The inferior vena cava is dilated in size with <50%  respiratory  variability, suggesting right atrial pressure of 15 mmHg.  Weight today 184 Weight at time of discharge 1 week ago 179.5 Daughter has not been giving metolazone, concerned there were too many pills Wonders whether to take the full metoprolol succinate 50 Remains on amiodarone 200 twice daily Feels better leg swelling  Sedentary  EKG personally reviewed by myself on todays visit Normal sinus rhythm rate 66 bpm old anterior MI, PVC  Past medical history reviewed   Cardiac cath 11/24/2020 Severe three-vessel disease Occluded RCA Severe ostial LAD disease, heavily calcified Moderate to severe proximal LAD and OM disease Reduced ejection fraction 35% based on prior echocardiogram  Right heart pressures mildly elevated, elevated EDP  Seen by CT surgery/Dr. Cyndia Bent Medical management recommended   hospital admission for sepsis, cholecystitis, abdominal pain Gall bladder surgery [facility]/13/2016 [facility]  History of claudication type symptoms have improved particularly on the right side after angioplasty .  Previous ultrasound showed bilateral tibial occlusive disease, residual 50% left SFA stenosis .    He does have a history of a right thalamic lacunar infarct in February 2012.   PMH:   has a past medical history of Anginal pain ([place]), Aortic atherosclerosis ([place]), Arthritis, BPH (benign prostatic hyperplasia), CAD (coronary artery disease), Carotid artery stenosis, COPD (chronic obstructive pulmonary disease) ([place]), CVA (cerebral infarction) (11/2010), GERD (gastroesophageal reflux disease), Heart murmur, HFrEF (heart failure with reduced ejection fraction) ([place]), Hyperlipidemia, Hypertension, Ischemic cardiomyopathy, Lower extremity cellulitis, NSTEMI (non-ST elevated myocardial infarction) ([place]) (08/09/2021), Peripheral vascular disease in diabetes mellitus ([place]), Pneumonia, and Type 2 diabetes mellitus treated with insulin ([place]).  PSH:    Past Surgical History:   Procedure Laterality Date   ABDOMINAL AORTOGRAM N/A 08/14/2021   Procedure: ABDOMINAL AORTOGRAM;  Surgeon: Nelva Bush, MD;  Location: [place] [facility];  Service: Cardiovascular;  Laterality: N/A;   CHOLECYSTECTOMY N/A 11/13/2016   Procedure: LAPAROSCOPIC CHOLECYSTECTOMY WITH INTRAOPERATIVE CHOLANGIOGRAM;  Surgeon: Olean Ree, MD;  Location: [facility];  Service: General;  Laterality: N/A;   COLONOSCOPY WITH PROPOFOL N/A 02/10/2018   Procedure: COLONOSCOPY WITH PROPOFOL;  Surgeon: Lucilla Lame, MD;  Location: [facility];  Service: Endoscopy;  Laterality: N/A;   CORONARY ATHERECTOMY N/A 08/15/2021   Procedure: CORONARY ATHERECTOMY;  Surgeon: Wellington Hampshire, MD;  Location: [place] [facility];  Service: Cardiovascular;  Laterality: N/A;   CORONARY STENT INTERVENTION W/IMPELLA N/A 08/15/2021   Procedure: Coronary Stent Intervention w/Impella;  Surgeon: Wellington Hampshire, MD;  Location: [place] [facility];  Service: Cardiovascular;  Laterality: N/A;   ERCP N/A 11/17/2016   Procedure: ENDOSCOPIC RETROGRADE CHOLANGIOPANCREATOGRAPHY (ERCP);  Surgeon: Irene Shipper, MD;  Location: [facility] ENDOSCOPY;  Service: Endoscopy;  Laterality: N/A;   ERCP N/A 02/04/2017   Procedure: ENDOSCOPIC RETROGRADE CHOLANGIOPANCREATOGRAPHY (ERCP) Stent removal;  Surgeon: Lucilla Lame, MD;  Location: [facility] ENDOS[facility];  Service: Endoscopy;  Laterality: N/A;   IR GENERIC HISTORICAL  10/06/2016   IR PERC CHOLECYSTOSTOMY 10/06/2016 Aletta Edouard, MD [facility]   JOINT REPLACEMENT Left 2014   left knee   RIGHT/LEFT HEART CATH AND CORONARY ANGIOGRAPHY N/A 11/24/2020   Procedure: RIGHT/LEFT HEART CATH AND CORONARY ANGIOGRAPHY;  Surgeon: Ida Rogue  J, MD;  Location: [place] [facility];  Service: Cardiovascular;  Laterality: N/A;   RIGHT/LEFT HEART CATH AND CORONARY ANGIOGRAPHY N/A 08/14/2021   Procedure: RIGHT/LEFT HEART CATH AND CORONARY ANGIOGRAPHY;  Surgeon: Nelva Bush, MD;  Location: [place] [facility];   Service: Cardiovascular;  Laterality: N/A;   TOTAL KNEE ARTHROPLASTY Right 08/08/2020   Procedure: TOTAL KNEE ARTHROPLASTY;  Surgeon: Lovell Sheehan, MD;  Location: [facility];  Service: Orthopedics;  Laterality: Right;   TOTAL KNEE REVISION Right 10/01/2021   Procedure: IRRIGATION AND DEBRIDMENT RIGHT KNEE WITH POLY EXCHANGE;  Surgeon: Lovell Sheehan, MD;  Location: [facility];  Service: Orthopedics;  Laterality: Right;   UPPER GASTROINTESTINAL ENDOSCOPY     WRIST SURGERY      Current Outpatient Medications  Medication Sig Dispense Refill   albuterol (VENTOLIN HFA) 108 (90 Base) MCG/ACT inhaler Inhale 2 puffs into the lungs every 6 (six) hours as needed for shortness of breath or wheezing.     ascorbic acid (VITAMIN C) 500 MG tablet Take 500 mg by mouth daily.      Blood Glucose Monitoring Suppl (ONE TOUCH ULTRA SYSTEM KIT) w/Device KIT 1 kit by Does not apply route once. Use DX code E11.59 1 each 0   cholecalciferol (VITAMIN D) 25 MCG (1000 UNIT) tablet Take 1,000 Units by mouth daily.     clopidogrel (PLAVIX) 75 MG tablet TAKE 1 TABLET(75 MG) BY MOUTH DAILY WITH BREAKFAST 30 tablet 4   co-enzyme Q-10 30 MG capsule Take 100 mg by mouth daily.     diclofenac Sodium (VOLTAREN) 1 % GEL Apply 2 g topically 2 (two) times daily as needed (pain).     docusate sodium (COLACE) 100 MG capsule Take 1 capsule (100 mg total) by mouth 2 (two) times daily. 30 capsule 0   doxycycline (VIBRA-TABS) 100 MG tablet Take 1 tablet (100 mg total) by mouth 2 (two) times daily. 60 tablet 5   ezetimibe (ZETIA) 10 MG tablet Take 1 tablet (10 mg total) by mouth daily. 90 tablet 3   furosemide (LASIX) 40 MG tablet Take 1 tablet (40 mg total) by mouth 2 (two) times daily. (Patient taking differently: Take 40 mg by mouth 2 (two) times daily. Taking once daily) 60 tablet 5   gabapentin (NEURONTIN) 400 MG capsule Take 400 mg by mouth 3 (three) times daily.     glipiZIDE (GLUCOTROL) 10 MG tablet Take 10 mg by mouth daily before  breakfast.     HYDROcodone-acetaminophen (NORCO/VICODIN) 5-325 MG tablet Take 1 tablet by mouth every 4 (four) hours as needed for moderate pain (pain score 4-6). 30 tablet 0   INS SYRINGE/NEEDLE 1CC/28G (B-D INSULIN SYRINGE 1CC/28G) 28G X 1/2" 1 ML MISC USE TO ADMINISTER INSULIN DAILY 100 each 0   insulin aspart protamine- aspart (NOVOLOG MIX 70/30) (70-30) 100 UNIT/ML injection Inject 0.15 mLs (15 Units total) into the skin 2 (two) times daily with a meal. 10 mL 11   latanoprost (XALATAN) 0.005 % ophthalmic solution Place 1 drop into both eyes at bedtime.  5   losartan (COZAAR) 25 MG tablet Take 0.5 tablets (12.5 mg total) by mouth daily. 30 tablet 5   metoprolol succinate (TOPROL XL) 25 MG 24 hr tablet Take 1 tablet (25 mg total) by mouth daily. 90 tablet 3   Multiple Vitamin (MULTIVITAMIN WITH MINERALS) TABS tablet Take 1 tablet by mouth in the morning.     nitroGLYCERIN (NITROSTAT) 0.4 MG SL tablet PLACE 1 TABLET UNDER THE TONGUE EVERY  5 MINUTES AS NEEDED FOR CHEST PAIN. MAX 3 TABLETS 25 tablet 1   Omega-3 Fatty Acids (FISH OIL) 1000 MG CAPS Take 1,000 mg by mouth daily at 6 (six) AM.     ONE TOUCH ULTRA TEST test strip USE 1 STRIP TO TEST THREE TIMES DAILY AS DIRECTED 100 each 0   ONETOUCH DELICA LANCETS 78M MISC Use three times daily to check blood sugar. 100 each 11   oxyCODONE (OXY IR/ROXICODONE) 5 MG immediate release tablet Take 5 mg by mouth every 4 (four) hours as needed for severe pain.     pantoprazole (PROTONIX) 40 MG tablet Take 40 mg by mouth daily.     potassium chloride SA (KLOR-CON M) 20 MEQ tablet Take 1 tablet (20 mEq total) by mouth 2 (two) times daily. Take with furosemide 60 tablet 5   rifampin (RIFADIN) 300 MG capsule Take 1 capsule (300 mg total) by mouth every 12 (twelve) hours. 60 capsule 1   rosuvastatin (CRESTOR) 10 MG tablet Take 1 tablet (10 mg total) by mouth daily. 90 tablet 3   spironolactone (ALDACTONE) 25 MG tablet Take 0.5 tablets (12.5 mg total) by mouth  daily. 15 tablet 11   tamsulosin (FLOMAX) 0.4 MG CAPS capsule Take 0.4 mg by mouth daily after breakfast.     timolol (TIMOPTIC) 0.5 % ophthalmic solution Place 1 drop into both eyes in the morning.  2   vitamin B-12 (CYANOCOBALAMIN) 500 MCG tablet Take 500 mcg by mouth daily.     vitamin E 180 MG (400 UNITS) capsule Take 400 Units by mouth daily.     metolazone (ZAROXOLYN) 2.5 MG tablet Take 1 tablet (2.5 mg total) by mouth as needed (As needed for weight gain). 90 tablet 3   No current facility-administered medications for this visit.    Allergies:   Keflex [cephalexin], Jardiance [empagliflozin], Metformin and related, Trazodone, and Torsemide   Social History:  The patient  reports that he quit smoking about 54 years ago. His smoking use included cigarettes. He has a 16.50 pack-year smoking history. He has never used smokeless tobacco. He reports that he does not drink alcohol and does not use drugs.   Family History:   family history includes Diabetes in his mother.    Review of Systems: Review of Systems  Constitutional: Negative.   HENT: Negative.    Respiratory:  Positive for shortness of breath.   Cardiovascular:  Positive for leg swelling.  Gastrointestinal: Negative.   Musculoskeletal:  Positive for joint pain.  Neurological: Negative.   Psychiatric/Behavioral: Negative.    All other systems reviewed and are negative.   PHYSICAL EXAM: BP 100/60 (BP Location: Left Arm, Patient Position: Sitting, Cuff Size: Normal)   Pulse (!) 56   Ht '5\' 11"'  (1.803 m)   Wt 191 lb 8 oz (86.9 kg)   SpO2 96%   BMI 26.71 kg/m  Constitutional:  oriented to person, place, and time. No distress.  HENT:  Head: Grossly normal Eyes:  no discharge. No scleral icterus.  Neck: No JVD, no carotid bruits  Cardiovascular: Regular rate and rhythm, no murmurs appreciated Trace pitting lower extremity edema Pulmonary/Chest: Clear to auscultation bilaterally, no wheezes or rails Abdominal: Soft.   Mild abdominal distention Musculoskeletal: Normal range of motion Neurological:  normal muscle tone. Coordination normal. No atrophy Skin: Skin warm and dry Psychiatric: normal affect, pleasant   Recent Labs: 11/15/2021: B Natriuretic Peptide 2,065.7 11/19/2021: Magnesium 2.0 01/17/2022: ALT 16; BUN 38; Creatinine, Ser 0.96; Hemoglobin 12.6; Platelets  178; Potassium 4.2; Sodium 138    Lipid Panel Lab Results  Component Value Date   CHOL 53 08/10/2021   HDL 13 (L) 08/10/2021   LDLCALC 29 08/10/2021   TRIG 54 08/10/2021      Wt Readings from Last 3 Encounters:  05/03/22 191 lb 8 oz (86.9 kg)  04/04/22 189 lb (85.7 kg)  02/14/22 178 lb (80.7 kg)     ASSESSMENT AND PLAN:  Type 2 diabetes, uncontrolled, with peripheral circulatory disorder (HCC) Prior history of poorly controlled diabetes, A1c up to 10 Most recent A1c 7.0, back on Jardiance Managed by primary care  PAD (peripheral artery disease) (HCC) Diffuse aortic atherosclerosis noted, moderate degree Severe carotid disease, followed by vascular Cholesterol at goal, on antiplatelet therapy  Carotid artery stenosis with cerebral infarction [facility]) Stressed importance of aggressive diabetes control, lipid control  Coronary artery disease involving native coronary artery of native heart without angina pectoris Underwent high risk stenting LAD Aspirin Plavix Denies anginal symptoms  chronic systolic CHF On Lasix 40 daily down from 40 twice daily Weight stable, no significant leg swelling Unclear amount of metolazone reports taking as needed for edema Back on Jardiance, spironolactone 12.5 daily, metoprolol succinate 25 daily, losartan 25 daily  Frequent PVCs Amiodarone 200 daily   Total encounter time more than 30 minutes  Greater than 50% was spent in counseling and coordination of care with the patient   Orders Placed This Encounter  Procedures   EKG 12-Lead     Signed, Esmond Plants, M.D., Ph.D. 05/03/2022   [place], [place]

## 2022-05-03 ENCOUNTER — Encounter: Payer: Self-pay | Admitting: Cardiovascular Disease

## 2022-05-03 ENCOUNTER — Ambulatory Visit (INDEPENDENT_AMBULATORY_CARE_PROVIDER_SITE_OTHER): Payer: Medicare Other | Admitting: Cardiovascular Disease

## 2022-05-03 VITALS — BP 100/60 | HR 56 | Ht 71.0 in | Wt 191.5 lb

## 2022-05-03 DIAGNOSIS — I890 Lymphedema, not elsewhere classified: Secondary | ICD-10-CM

## 2022-05-03 DIAGNOSIS — E1122 Type 2 diabetes mellitus with diabetic chronic kidney disease: Secondary | ICD-10-CM | POA: Diagnosis not present

## 2022-05-03 DIAGNOSIS — I10 Essential (primary) hypertension: Secondary | ICD-10-CM

## 2022-05-03 DIAGNOSIS — I255 Ischemic cardiomyopathy: Secondary | ICD-10-CM

## 2022-05-03 DIAGNOSIS — I2510 Atherosclerotic heart disease of native coronary artery without angina pectoris: Secondary | ICD-10-CM

## 2022-05-03 DIAGNOSIS — N181 Chronic kidney disease, stage 1: Secondary | ICD-10-CM

## 2022-05-03 DIAGNOSIS — I5022 Chronic systolic (congestive) heart failure: Secondary | ICD-10-CM | POA: Diagnosis not present

## 2022-05-03 DIAGNOSIS — E785 Hyperlipidemia, unspecified: Secondary | ICD-10-CM

## 2022-05-03 DIAGNOSIS — I5023 Acute on chronic systolic (congestive) heart failure: Secondary | ICD-10-CM

## 2022-05-03 DIAGNOSIS — I272 Pulmonary hypertension, unspecified: Secondary | ICD-10-CM

## 2022-05-03 DIAGNOSIS — I739 Peripheral vascular disease, unspecified: Secondary | ICD-10-CM

## 2022-05-03 DIAGNOSIS — Z794 Long term (current) use of insulin: Secondary | ICD-10-CM

## 2022-05-03 NOTE — Patient Instructions (Addendum)
Medication Instructions:  No changes  If you need a refill on your cardiac medications before your next appointment, please call your pharmacy.    Lab work: No new labs needed   Testing/Procedures: No new testing needed   Follow-Up: At [facility], you and your health needs are our priority.  As part of our continuing mission to provide you with exceptional heart care, we have created designated Provider Care Teams.  These Care Teams include your primary Cardiologist (physician) and Advanced Practice Providers (APPs -  Physician Assistants and Nurse Practitioners) who all work together to provide you with the care you need, when you need it.  You will need a follow up appointment in 6 months  Providers on your designated Care Team:   Christopher Berge, NP Ryan Dunn, PA-C Cadence Furth, PA-C  COVID-19 Vaccine Information can be found at: [email or web address] For questions related to vaccine distribution or appointments, please email [email or web address] or call [phone].   

## 2022-06-03 ENCOUNTER — Other Ambulatory Visit: Payer: Self-pay | Admitting: Family

## 2022-06-03 MED ORDER — FUROSEMIDE 40 MG PO TABS: 40.0000 mg | ORAL_TABLET | Freq: Every day | ORAL | 2 refills | Status: DC

## 2022-06-03 MED ORDER — POTASSIUM CHLORIDE CRYS ER 20 MEQ PO TBCR
20.0000 meq | EXTENDED_RELEASE_TABLET | Freq: Every day | ORAL | 2 refills | Status: DC
Start: 2022-06-03 — End: 2022-12-20

## 2022-07-03 ENCOUNTER — Other Ambulatory Visit: Payer: Self-pay

## 2022-07-03 MED ORDER — DOXYCYCLINE HYCLATE 100 MG PO TABS: 100.0000 mg | ORAL_TABLET | Freq: Two times a day (BID) | ORAL | 5 refills | Status: DC

## 2022-07-08 ENCOUNTER — Telehealth: Payer: Self-pay

## 2022-07-08 NOTE — Telephone Encounter (Signed)
Patient left a message requesting a refill on Keflex. Per last office note patient should only be taking doxycycline. Per Dr. Delaine Lame Keflex was stopped in March due to a rash and patient switched to doxy.  I attempted to contact the patient to discuss his medication and I was unable to connect with the patient. I spoke with patient's daughter [name] and explained to her that patient should only be on doxy. She stated patient realized that he was having a reaction to another medication and not the keflex and he did recently take keflex when he was out of doxy. Patient's daughter stated she would discuss with the patient that he had to chose doxy or keflex. Henry Lindsey

## 2022-07-19 ENCOUNTER — Other Ambulatory Visit: Payer: Self-pay | Admitting: Cardiovascular Disease

## 2022-08-13 ENCOUNTER — Encounter: Payer: Self-pay | Admitting: Infectious Diseases

## 2022-08-13 ENCOUNTER — Ambulatory Visit: Payer: Medicare Other | Attending: Infectious Diseases | Admitting: Infectious Diseases

## 2022-08-13 VITALS — BP 127/64 | HR 51 | Temp 97.8°F | Ht 71.0 in | Wt 196.0 lb

## 2022-08-13 DIAGNOSIS — I110 Hypertensive heart disease with heart failure: Secondary | ICD-10-CM | POA: Insufficient documentation

## 2022-08-13 DIAGNOSIS — Z8673 Personal history of transient ischemic attack (TIA), and cerebral infarction without residual deficits: Secondary | ICD-10-CM | POA: Diagnosis not present

## 2022-08-13 DIAGNOSIS — B9561 Methicillin susceptible Staphylococcus aureus infection as the cause of diseases classified elsewhere: Secondary | ICD-10-CM | POA: Insufficient documentation

## 2022-08-13 DIAGNOSIS — Z7984 Long term (current) use of oral hypoglycemic drugs: Secondary | ICD-10-CM | POA: Insufficient documentation

## 2022-08-13 DIAGNOSIS — Z955 Presence of coronary angioplasty implant and graft: Secondary | ICD-10-CM | POA: Insufficient documentation

## 2022-08-13 DIAGNOSIS — Z79899 Other long term (current) drug therapy: Secondary | ICD-10-CM | POA: Diagnosis not present

## 2022-08-13 DIAGNOSIS — T8453XS Infection and inflammatory reaction due to internal right knee prosthesis, sequela: Secondary | ICD-10-CM | POA: Insufficient documentation

## 2022-08-13 DIAGNOSIS — R21 Rash and other nonspecific skin eruption: Secondary | ICD-10-CM | POA: Insufficient documentation

## 2022-08-13 DIAGNOSIS — Z96652 Presence of left artificial knee joint: Secondary | ICD-10-CM | POA: Diagnosis not present

## 2022-08-13 DIAGNOSIS — I739 Peripheral vascular disease, unspecified: Secondary | ICD-10-CM | POA: Insufficient documentation

## 2022-08-13 DIAGNOSIS — T8450XD Infection and inflammatory reaction due to unspecified internal joint prosthesis, subsequent encounter: Secondary | ICD-10-CM

## 2022-08-13 DIAGNOSIS — E119 Type 2 diabetes mellitus without complications: Secondary | ICD-10-CM | POA: Insufficient documentation

## 2022-08-13 DIAGNOSIS — Z791 Long term (current) use of non-steroidal anti-inflammatories (NSAID): Secondary | ICD-10-CM | POA: Insufficient documentation

## 2022-08-13 DIAGNOSIS — I5022 Chronic systolic (congestive) heart failure: Secondary | ICD-10-CM | POA: Diagnosis not present

## 2022-08-13 DIAGNOSIS — I255 Ischemic cardiomyopathy: Secondary | ICD-10-CM | POA: Insufficient documentation

## 2022-08-13 DIAGNOSIS — D649 Anemia, unspecified: Secondary | ICD-10-CM | POA: Insufficient documentation

## 2022-08-13 DIAGNOSIS — Z7902 Long term (current) use of antithrombotics/antiplatelets: Secondary | ICD-10-CM | POA: Diagnosis not present

## 2022-08-13 DIAGNOSIS — I2510 Atherosclerotic heart disease of native coronary artery without angina pectoris: Secondary | ICD-10-CM | POA: Diagnosis not present

## 2022-08-13 DIAGNOSIS — M00061 Staphylococcal arthritis, right knee: Secondary | ICD-10-CM | POA: Insufficient documentation

## 2022-08-13 DIAGNOSIS — T8453XD Infection and inflammatory reaction due to internal right knee prosthesis, subsequent encounter: Secondary | ICD-10-CM | POA: Diagnosis not present

## 2022-08-13 DIAGNOSIS — M7989 Other specified soft tissue disorders: Secondary | ICD-10-CM | POA: Diagnosis not present

## 2022-08-13 NOTE — Progress Notes (Signed)
NAME: Henry Lindsey  DOB: 1933-08-13  MRN: [id number]  Date/Time: 08/13/2022 11:21 AM Follow up visit for rt knee PJI due to MSSA . He is on indefinite Doxy 175m once a day  Henry CALLEROSis a 88y.o. with a history of  CAD status post stent placement, CHF , HTN, ischemic cardiomyopathy, bilateral leg swelling, right prosthetic joint infection, peripheral arterial disease,  Patient in.Dec 2022 diagnosed with Rt knee septic arthritis due to MSSA  underwent irrigation and debridement right knee with polyexchange on 10/01/2021 with Dr.Bowers. HE took Iv cefazolin + rifampin for 6 weeks and then PO rifampin + keflex for a total of 6 months but keflex was changed because of rash to doxy He is being followed by heart failure clinic Patient is 100% compliant with his medication. He says his penis has shrunk because of antibiotic HE is doing very well otherwise Last seen Henry Lindsey on 05/03/22  Past Medical History:  Diagnosis Date   Anginal pain (HCC)    Aortic atherosclerosis (HCC)    Arthritis    BPH (benign prostatic hyperplasia)    CAD (coronary artery disease)    a.) 2/22 Cath: LM 50d, LAD sev ost dz, mod-sev prox/mid dz. D1 sev dz. LCX mild prox dzs, OM2 mod-sev dz, RCA 100p. RPL and RPDA fill via L-R collats. EF 35%. Seen by CVTS->Med mgmt. b.) R/LHC 08/14/21: 30% m-d-LM, 85% oLAD, 50% pLAD, 35% dLAD, 80% D1, 50% pLCx, 70% OM1, 100% p-dRCA -> trans to [facility]. c.) 08/15/21 Impella supported HIGH RISK PCI with atherectomy. 2.75 x 247mOnyx Frontier DES oLAD.   Carotid artery stenosis    a. 11/2010 U/S: <50% bilaterally; b. 03/2021 40-59% bilat ICA stenoses.   COPD (chronic obstructive pulmonary disease) ([facility]   noted CT 10/05/16 former smoker quit age 88  CVA (cerebral infarction) 11/2010   a.) RIGHT thalamic lacunar   GERD (gastroesophageal reflux disease)    Heart murmur    HFrEF (heart failure with reduced ejection fraction) ([facility]   a. 10/2020 Echo: EF 35%; b. 10/2021 Echo: EF 30-35%, mod  reduced RV fxn, mod dil LA.   Hyperlipidemia    Hypertension    Ischemic cardiomyopathy    a. 10/2020 Echo: EF 35%. Nl RVSP. Mod MR. Mild TR; b. 11/2020 Cath:  CO/CI 5.05/2.49. LV gram: EF 35%; c. 10/2021 Echo: EF 30-35%, mod reduced RV fxn, mod dil LA.   Lower extremity cellulitis    NSTEMI (non-ST elevated myocardial infarction) (HCBadger10/20/2022   a.) R/LHC 08/14/2021: 30% m-d-LM, 85% oLAD, 50% pLAD, 35% dLAD, 80% D1, 50% pLCx, 70% OM1, 100% p-dRCA -> transfer to [facility]. b.) 08/15/2021 Impella supported HIGH RISK PCI with atherectomy. 2.75 x 2673mnyx Frontier DES to [facility] Peripheral vascular disease in diabetes mellitus ([place]CC[place]  a. 06/2012 95% occlusion s/p PTA R SFA (Dr. DewLucky Cowboyb. 03/2021 ABIs: R 1.02, L 0.96.   Pneumonia    Type 2 [facility] mellitus [facility] with [facility]   Past Surgical History:  Procedure Laterality Date   ABDOMINAL AORTOGRAM N/A 08/14/2021   Procedure: ABDOMINAL AORTOGRAM;  Surgeon: EndNelva BushD;  [place] [facility];  Service: Cardiovascular;  Laterality: N/A;   CHOLECYSTECTOMY N/A 11/13/2016   Procedure: LAPAROSCOPIC CHOLECYSTECTOMY WITH INTRAOPERATIVE CHOLANGIOGRAM;  Surgeon: Henry ReeD;  Location: [facility];  Service: General;  Laterality: N/A;   COLONOSCOPY WITH PROPOFOL N/A 02/10/2018   Procedure: COLONOSCOPY WITH PROPOFOL;  Surgeon: Henry Lame  MD;  Location: [facility] ENDOSCOPY;  Service: Endoscopy;  Laterality: N/A;   CORONARY ATHERECTOMY N/A 08/15/2021   Procedure: CORONARY ATHERECTOMY;  Surgeon: Henry Hampshire, MD;  Location: [place] [facility];  Service: Cardiovascular;  Laterality: N/A;   CORONARY STENT INTERVENTION W/IMPELLA N/A 08/15/2021   Procedure: Coronary Stent Intervention w/Impella;  Surgeon: Henry Hampshire, MD;  Location: [place] [facility];  Service: Cardiovascular;  Laterality: N/A;   ERCP N/A 11/17/2016   Procedure: ENDOSCOPIC RETROGRADE CHOLANGIOPANCREATOGRAPHY (ERCP);  Surgeon: Henry Shipper, MD;  Location: [facility]  ENDOSCOPY;  Service: Endoscopy;  Laterality: N/A;   ERCP N/A 02/04/2017   Procedure: ENDOSCOPIC RETROGRADE CHOLANGIOPANCREATOGRAPHY (ERCP) Stent removal;  Surgeon: Lucilla Lame, MD;  Location: [facility] ENDOSCOPY;  Service: Endoscopy;  Laterality: N/A;   IR GENERIC HISTORICAL  10/06/2016   IR PERC CHOLECYSTOSTOMY 10/06/2016 Henry Edouard, MD [facility]   JOINT REPLACEMENT Left 2014   left knee   RIGHT/LEFT HEART CATH AND CORONARY ANGIOGRAPHY N/A 11/24/2020   Procedure: RIGHT/LEFT HEART CATH AND CORONARY ANGIOGRAPHY;  Surgeon: Henry Merritts, MD;  Location: [place] [facility] LAB;  Service: Cardiovascular;  Laterality: N/A;   RIGHT/LEFT HEART CATH AND CORONARY ANGIOGRAPHY N/A 08/14/2021   Procedure: RIGHT/LEFT HEART CATH AND CORONARY ANGIOGRAPHY;  Surgeon: Henry Bush, MD;  Location: [place] [facility];  Service: Cardiovascular;  Laterality: N/A;   TOTAL KNEE ARTHROPLASTY Right 08/08/2020   Procedure: TOTAL KNEE ARTHROPLASTY;  Surgeon: Henry Sheehan, MD;  Location: [facility] ORS;  Service: Orthopedics;  Laterality: Right;   TOTAL KNEE REVISION Right 10/01/2021   Procedure: IRRIGATION AND DEBRIDMENT RIGHT KNEE WITH POLY EXCHANGE;  Surgeon: Henry Sheehan, MD;  Location: [facility] ORS;  Service: Orthopedics;  Laterality: Right;   UPPER GASTROINTESTINAL ENDOSCOPY     WRIST SURGERY      Social History   Socioeconomic History   Marital status: Widowed    Spouse name: Henry Lindsey, deceased   Number of children: 2   Years of education: 82   Highest education level: Not on file  Occupational History    Employer: retired  Tobacco Use   Smoking status: Former    Packs/day: 0.50    Years: 33.00    Total pack years: 16.50    Types: Cigarettes    Quit date: 04/21/1968    Years since quitting: 54.3   Smokeless tobacco: Never  Vaping Use   Vaping Use: Never used  Substance and Sexual Activity   Alcohol use: No   Drug use: No   Sexual activity: Not Currently    Partners: Female    Birth  control/protection: None  Other Topics Concern   Not on file  Social History Narrative   Widowed in 2014; dating again , stays with girlfriend house   Social Determinants of Health   Financial Resource Strain: Not on file  Food Insecurity: Not on file  Transportation Needs: Not on file  Physical Activity: Not on file  Stress: Not on file  Social Connections: Not on file  Intimate Partner Violence: Not on file    Family History  Problem Relation Age of Onset   Diabetes Mother    Allergies  Allergen Reactions   Keflex [Cephalexin] Rash   Jardiance [Empagliflozin]     Rash all over and lips turn purple.    Metformin And Related Diarrhea   Trazodone Other (See Comments)    Extreme hallucinations/ psychosis    Torsemide Rash    "Skin reaction"   I? Current Outpatient Medications  Medication Sig Dispense Refill   albuterol (VENTOLIN HFA) 108 (90 Base) MCG/ACT inhaler Inhale 2 puffs into the lungs every 6 (six) hours as needed for shortness of breath or wheezing.     ascorbic acid (VITAMIN C) 500 MG tablet Take 500 mg by mouth daily.      Blood Glucose Monitoring Suppl (ONE TOUCH ULTRA SYSTEM KIT) w/Device KIT 1 kit by Does not apply route once. Use DX code E11.59 1 each 0   cholecalciferol (VITAMIN D) 25 MCG (1000 UNIT) tablet Take 1,000 Units by mouth daily.     clopidogrel (PLAVIX) 75 MG tablet TAKE 1 TABLET(75 MG) BY MOUTH DAILY WITH BREAKFAST 30 tablet 4   co-enzyme Q-10 30 MG capsule Take 100 mg by mouth daily.     diclofenac Sodium (VOLTAREN) 1 % GEL Apply 2 g topically 2 (two) times daily as needed (pain).     docusate sodium (COLACE) 100 MG capsule Take 1 capsule (100 mg total) by mouth 2 (two) times daily. 30 capsule 0   doxycycline (VIBRA-TABS) 100 MG tablet Take 1 tablet (100 mg total) by mouth 2 (two) times daily. 60 tablet 5   empagliflozin (JARDIANCE) 25 MG TABS tablet Take 1 tablet (25 mg total) by mouth daily before breakfast. 30 tablet    ezetimibe (ZETIA) 10 MG  tablet Take 1 tablet (10 mg total) by mouth daily. 90 tablet 3   furosemide (LASIX) 40 MG tablet Take 1 tablet (40 mg total) by mouth daily. 30 tablet 2   gabapentin (NEURONTIN) 400 MG capsule Take 400 mg by mouth 3 (three) times daily.     glipiZIDE (GLUCOTROL) 10 MG tablet Take 10 mg by mouth daily before breakfast.     HYDROcodone-acetaminophen (NORCO/VICODIN) 5-325 MG tablet Take 1 tablet by mouth every 4 (four) hours as needed for moderate pain (pain score 4-6). 30 tablet 0   INS SYRINGE/NEEDLE 1CC/28G (B-D INSULIN SYRINGE 1CC/28G) 28G X 1/2" 1 ML MISC USE TO ADMINISTER INSULIN DAILY 100 each 0   insulin aspart protamine- aspart (NOVOLOG MIX 70/30) (70-30) 100 UNIT/ML injection Inject 0.15 mLs (15 Units total) into the skin 2 (two) times daily with a meal. 10 mL 11   latanoprost (XALATAN) 0.005 % ophthalmic solution Place 1 drop into both eyes at bedtime.  5   losartan (COZAAR) 25 MG tablet Take 0.5 tablets (12.5 mg total) by mouth daily. 30 tablet 5   metoprolol succinate (TOPROL XL) 25 MG 24 hr tablet Take 1 tablet (25 mg total) by mouth daily. 90 tablet 3   Multiple Vitamin (MULTIVITAMIN WITH MINERALS) TABS tablet Take 1 tablet by mouth in the morning.     nitroGLYCERIN (NITROSTAT) 0.4 MG SL tablet PLACE 1 TABLET UNDER THE TONGUE EVERY 5 MINUTES AS NEEDED FOR CHEST PAIN. MAX 3 TABLETS 25 tablet 1   Omega-3 Fatty Acids (FISH OIL) 1000 MG CAPS Take 1,000 mg by mouth daily at 6 (six) AM.     ONE TOUCH ULTRA TEST test strip USE 1 STRIP TO TEST THREE TIMES DAILY AS DIRECTED 100 each 0   ONETOUCH DELICA LANCETS 65H MISC Use three times daily to check blood sugar. 100 each 11   oxyCODONE (OXY IR/ROXICODONE) 5 MG immediate release tablet Take 5 mg by mouth every 4 (four) hours as needed for severe pain.     pantoprazole (PROTONIX) 40 MG tablet Take 40 mg by mouth daily.     potassium chloride SA (KLOR-CON M) 20 MEQ tablet Take 1 tablet (20 mEq  total) by mouth daily. Take with furosemide 30 tablet 2    rifampin (RIFADIN) 300 MG capsule Take 1 capsule (300 mg total) by mouth every 12 (twelve) hours. 60 capsule 1   rosuvastatin (CRESTOR) 10 MG tablet Take 1 tablet (10 mg total) by mouth daily. 90 tablet 3   spironolactone (ALDACTONE) 25 MG tablet Take 0.5 tablets (12.5 mg total) by mouth daily. 15 tablet 11   tamsulosin (FLOMAX) 0.4 MG CAPS capsule Take 0.4 mg by mouth daily after breakfast.     timolol (TIMOPTIC) 0.5 % ophthalmic solution Place 1 drop into both eyes in the morning.  2   vitamin B-12 (CYANOCOBALAMIN) 500 MCG tablet Take 500 mcg by mouth daily.     vitamin E 180 MG (400 UNITS) capsule Take 400 Units by mouth daily.     metolazone (ZAROXOLYN) 2.5 MG tablet Take 1 tablet (2.5 mg total) by mouth as needed (As needed for weight gain). 90 tablet 3   No current facility-administered medications for this visit.     Abtx:  Anti-infectives (From admission, onward)    None       REVIEW OF SYSTEMS:  Const: negative fever, negative chills, has lost fluid from his legs Eyes: negative diplopia or visual changes, negative eye pain ENT: negative coryza, negative sore throat Resp: negative cough, hemoptysis, dyspnea Cards: negative for chest pain, palpitations, has lower extremity edema GU: negative for frequency, dysuria and hematuria GI: Negative for abdominal pain, diarrhea, bleeding, constipation Skin: no dryness Heme: negative for easy bruising and gum/nose bleeding MS:no knee painh  Neurolo:negative for headaches, dizziness, vertigo, memory problems  Psych: negative for feelings of anxiety, depression  Endocrine: diabetes Allergy/Immunology- as above Objective:  VITALS:  BP 127/64   Pulse (!) 51   Temp 97.8 F (36.6 C) (Temporal)   Ht _0  (1.803 m)   Wt 196 lb (88.9 kg)   BMI 27.34 kg/m   PHYSICAL EXAM:  General: looks well.  Head: Normocephalic, without obvious abnormality, atraumatic. Eyes: Conjunctivae clear, anicteric sclerae. Pupils are equal ENT  Nares normal. No drainage or sinus tenderness. Lips, mucosa, and tongue normal. No Thrush Neck: Supple, symmetrical, no adenopathy, thyroid: non tender no carotid bruit and no JVD. Back: No CVA tenderness. Lungs: Clear to auscultation bilaterally. No Wheezing or Rhonchi. No rales. Heart: s1s2 Abdomen: not assessed Extremities: edema legs resolved completely  Rt knee surgical scar healed well Skin:changes of sun damage    Lymph: Cervical, supraclavicular normal. Neurologic: Grossly non-focal Pertinent Labs none  ? Impression/Recommendation ?Staph aureus  prosthetic joint infection of the right knee.resolved  Pt is currently on indefinite suppressive  antibiotic therapy with Doxy 155m once a day  after completing IV antibiotic  for 6 weeks and and 4 1/2 months of combination rifampin + keflex and the latter changed to doxy as he had a rash    B/l venous edema and erythema- resolved Cellulitis- resolved Doing  calf and foot/ankle exercises everyday   Complicated cardiovascular history High risk PCI with stenting of the LAD under Impella support on plavix/aspirin, atorvastatin and metoprolol  Congestive heart failure with EF of 25 to 30% doing much better--managed by cardiologist, on entresto, spirinolactone, furosemide and metolazone  Pt is doing better overall  Anemia History of CVA  History of PAD  History of left TKA     Follow up 1 yr ? ? ___________________________________________________ Discussed with patient,in detail

## 2022-08-13 NOTE — Patient Instructions (Signed)
You are here for follow up of Rt knee prosthetic joint infection due to staph- you are on Doxy '100mg'$  once  day indefinitely to prevent recurrence of infection- follow up 1 year.

## 2022-08-19 ENCOUNTER — Encounter (INDEPENDENT_AMBULATORY_CARE_PROVIDER_SITE_OTHER): Payer: Self-pay

## 2022-09-23 ENCOUNTER — Other Ambulatory Visit: Payer: Self-pay | Admitting: Family

## 2022-09-28 ENCOUNTER — Other Ambulatory Visit: Payer: Self-pay | Admitting: Family

## 2022-10-05 ENCOUNTER — Other Ambulatory Visit: Payer: Self-pay | Admitting: Family

## 2022-11-01 ENCOUNTER — Other Ambulatory Visit: Payer: Self-pay | Admitting: Cardiovascular Disease

## 2022-11-01 NOTE — Telephone Encounter (Signed)
Please schedule 6 month F/U appointment. Thank you! ?

## 2022-11-04 ENCOUNTER — Other Ambulatory Visit: Payer: Self-pay | Admitting: Family

## 2022-11-04 NOTE — Telephone Encounter (Signed)
Pt is scheduled on 2/20

## 2022-11-06 ENCOUNTER — Telehealth: Payer: Self-pay | Admitting: Cardiovascular Disease

## 2022-11-06 MED ORDER — FUROSEMIDE 40 MG PO TABS: 40.0000 mg | ORAL_TABLET | Freq: Every day | ORAL | 0 refills | Status: DC

## 2022-11-06 NOTE — Telephone Encounter (Signed)
*  STAT* If patient is at the pharmacy, call can be transferred to refill team.   1. Which medications need to be refilled? (please list name of each medication and dose if known) furosemide (LASIX) 40 MG tablet (Expired)   2. Which pharmacy/location (including street and city if local pharmacy) is medication to be sent to? [facility] [place], [place]   3. Do they need a 30 day or 90 day supply? [place]

## 2022-12-08 ENCOUNTER — Other Ambulatory Visit: Payer: Self-pay | Admitting: Family

## 2022-12-10 ENCOUNTER — Ambulatory Visit: Payer: Medicare Other | Admitting: Cardiovascular Disease

## 2022-12-18 NOTE — Progress Notes (Signed)
Cardiology Office Note  Date:  12/20/2022   ID:  Vatche, Berman 1933/03/31, MRN [id number]  PCP:  Baxter Hire, MD   Chief Complaint  Patient presents with   Other    6 month f/u no complaints today. Meds reviewed verbally with pt.    HPI:  Henry Lindsey is a 89 y.o. male with a history of  hyperlipidemia,  peripheral vascular disease with  angioplasty of the distal right SFA by Dr.  Lucky Cowboy,  50% carotid disease bilaterally, history of poorly controlled diabetes,  history of stroke with minimal residual left sided deficits,  CT scan 10/05/2016 Moderate diffuse aortic atherosclerosis Moderate diffuse three-vessel coronary disease Prior smoker, stopped in 1969 Echo October 2022 EF 25 to 30% Echo January 2023 EF 30 to 35% Presents for f/u of his CAD, CHF  Last seen in clinic by myself July 2023  In the hospital February 2023 EF 30 to 35%, CHF, symptoms of shortness of breath On amiodarone for frequent PVCs  In follow-up today reports he is doing well Weight is up over 10 pounds from prior clinic visit this time last year He feels that is from more muscle mass Exercising more, weights, sit ups  Stopped the Jardiance, this was too expensive reports it was $400 than $1000 several months ago, he did not call our office to let us know Possibly from donut hole?  Reports that he stopped the potassium, was told his lab work was fine  Lab work reviewed A1c 8.0 down from 8.7 Creatinine 1.2, BUN 30, potassium 5  No recent lipid panel, last on 2022 total cholesterol 85 LDL 33 On Lasix 40 twice a day metolazone twice weekly, spironolactone 12.5 daily Weight at time of discharge 179.5 pounds  Continues on  spironolactone 12.5 daily, metoprolol succinate 25 daily, losartan 25 daily, furosemide 40 daily off Jardiance  Prior history of rash on his legs   hospitalization for CHF 1/23 Ef 30 to 35%  EKG personally reviewed by myself on todays visit Nsr rate 56 bpm old anterio  mi  Echo 08/10/21  1. Left ventricular ejection fraction, by estimation, is 25 to 30%. The  left ventricle has severely decreased function. The left ventricle  demonstrates global hypokinesis with severe hypokinesis of the anterior,  septal, and apical region. Inferior wall  best preserved. Left ventricular diastolic parameters are consistent with  Grade II diastolic dysfunction (pseudonormalization). The average left  ventricular global longitudinal strain is -6.3 %. The global longitudinal  strain is abnormal.   2. Right ventricular systolic function is moderately reduced. The right  ventricular size is moderately enlarged. There is mildly elevated  pulmonary artery systolic pressure. The estimated right ventricular  systolic pressure is 99991111 mmHg.  Moderate to severe mitral valve regurgitation.   Cardiac cath 08/15/21 Successful Impella supported high risk PCI of the ostial LAD with atherectomy and drug-eluting stent placement.  The stent extended 1 to 2 mm into the left main coronary artery to ensure coverage of the ostium.  There was normal flow into the  left circumflex. Moderately elevated left ventricular end-diastolic pressure at 24 mmHg at the beginning of the case.   Dual antiplatelet therapy for at least 12 months and preferably longer.  admitted from our office on 11/15/21 with acute on chronic combined CHF and electrolyte derangements after failing outpatient diuresis.  weight was up 22 pounds compared to September 2022 Status post right-sided thoracentesis 1/29 with 1.7 L of fluid removed Frequent PVCs Discharge  November 21, 2021 Lasix 40 twice daily metolazone twice a week   Weight today 184 Weight at time of discharge 1 week ago 179.5 Daughter has not been giving metolazone, concerned there were too many pills Wonders whether to take the full metoprolol succinate 50 Remains on amiodarone 200 twice daily Feels better leg swelling  Sedentary  EKG personally  reviewed by myself on todays visit Normal sinus rhythm rate 66 bpm old anterior MI, PVC  Past medical history reviewed   Cardiac cath 11/24/2020 Severe three-vessel disease Occluded RCA Severe ostial LAD disease, heavily calcified Moderate to severe proximal LAD and OM disease Reduced ejection fraction 35% based on prior echocardiogram  Right heart pressures mildly elevated, elevated EDP  Seen by CT surgery/Dr. Cyndia Bent Medical management recommended   hospital admission for sepsis, cholecystitis, abdominal pain Gall bladder surgery scheduled for 11/13/2016 at [facility]  History of claudication type symptoms have improved particularly on the right side after angioplasty .  Previous ultrasound showed bilateral tibial occlusive disease, residual 50% left SFA stenosis .    He does have a history of a right thalamic lacunar infarct in February 2012.   PMH:   has a past medical history of Anginal pain ([place]), Aortic atherosclerosis ([place]), Arthritis, BPH (benign prostatic hyperplasia), CAD (coronary artery disease), Carotid artery stenosis, COPD (chronic obstructive pulmonary disease) ([place]), CVA (cerebral infarction) (11/2010), GERD (gastroesophageal reflux disease), Heart murmur, HFrEF (heart failure with reduced ejection fraction) ([place]), Hyperlipidemia, Hypertension, Ischemic cardiomyopathy, Lower extremity cellulitis, NSTEMI (non-ST elevated myocardial infarction) ([place]) (08/09/2021), Peripheral vascular disease in diabetes mellitus ([place]), Pneumonia, and Type 2 diabetes mellitus treated with insulin ([place]).  PSH:    Past Surgical History:  Procedure Laterality Date   ABDOMINAL AORTOGRAM N/A 08/14/2021   Procedure: ABDOMINAL AORTOGRAM;  Surgeon: Nelva Bush, MD;  Location: [place] [facility];  Service: Cardiovascular;  Laterality: N/A;   CHOLECYSTECTOMY N/A 11/13/2016   Procedure: LAPAROSCOPIC CHOLECYSTECTOMY WITH INTRAOPERATIVE CHOLANGIOGRAM;  Surgeon: Olean Ree, MD;  Location: [facility];   Service: General;  Laterality: N/A;   COLONOSCOPY WITH PROPOFOL N/A 02/10/2018   Procedure: COLONOSCOPY WITH PROPOFOL;  Surgeon: Lucilla Lame, MD;  Location: [facility];  Service: Endoscopy;  Laterality: N/A;   CORONARY ATHERECTOMY N/A 08/15/2021   Procedure: CORONARY ATHERECTOMY;  Surgeon: Wellington Hampshire, MD;  Location: [place] [facility];  Service: Cardiovascular;  Laterality: N/A;   CORONARY STENT INTERVENTION W/IMPELLA N/A 08/15/2021   Procedure: Coronary Stent Intervention w/Impella;  Surgeon: Wellington Hampshire, MD;  Location: [place] [facility];  Service: Cardiovascular;  Laterality: N/A;   ERCP N/A 11/17/2016   Procedure: ENDOSCOPIC RETROGRADE CHOLANGIOPANCREATOGRAPHY (ERCP);  Surgeon: Irene Shipper, MD;  Location: [facility]COPY;  Service: Endoscopy;  Laterality: N/A;   ERCP N/A 02/04/2017   Procedure: ENDOSCOPIC RETROGRADE CHOLANGIOPANCREATOGRAPHY (ERCP) Stent removal;  Surgeon: Lucilla Lame, MD;  Location: [facility] ENDOSCOPY;  Service: Endoscopy;  Laterality: N/A;   IR GENERIC HISTORICAL  10/06/2016   IR PERC CHOLECYSTOSTOMY 10/06/2016 Aletta Edouard, MD [facility]   JOINT REPLACEMENT Left 2014   left knee   RIGHT/LEFT HEART CATH AND CORONARY ANGIOGRAPHY N/A 11/24/2020   Procedure: RIGHT/LEFT HEART CATH AND CORONARY ANGIOGRAPHY;  Surgeon: Minna Merritts, MD;  Location: [place] [facility];  Service: Cardiovascular;  Laterality: N/A;   RIGHT/LEFT HEART CATH AND CORONARY ANGIOGRAPHY N/A 08/14/2021   Procedure: RIGHT/LEFT HEART CATH AND CORONARY ANGIOGRAPHY;  Surgeon: Nelva Bush, MD;  Location: [place] [facility];  Service: Cardiovascular;  Laterality: N/A;   TOTAL  KNEE ARTHROPLASTY Right 08/08/2020   Procedure: TOTAL KNEE ARTHROPLASTY;  Surgeon: Lovell Sheehan, MD;  Location: [facility];  Service: Orthopedics;  Laterality: Right;   TOTAL KNEE REVISION Right 10/01/2021   Procedure: IRRIGATION AND DEBRIDMENT RIGHT KNEE WITH POLY EXCHANGE;  Surgeon: Lovell Sheehan, MD;  Location:  [facility] OR[facility];  Service: Orthopedics;  Laterality: Right;   UPPER GASTROINTESTINAL ENDOSCOPY     WRIST SURGERY      Current Outpatient Medications  Medication Sig Dispense Refill   albuterol (VENTOLIN HFA) 108 (90 Base) MCG/ACT inhaler Inhale 2 puffs into the lungs every 6 (six) hours as needed for shortness of breath or wheezing.     ascorbic acid (VITAMIN C) 500 MG tablet Take 500 mg by mouth daily.      Blood Glucose Monitoring Suppl (ONE TOUCH ULTRA SYSTEM KIT) w/Device KIT 1 kit by Does not apply route once. Use DX code E11.59 1 each 0   cholecalciferol (VITAMIN D) 25 MCG (1000 UNIT) tablet Take 1,000 Units by mouth daily.     clopidogrel (PLAVIX) 75 MG tablet TAKE 1 TABLET(75 MG) BY MOUTH DAILY WITH BREAKFAST 30 tablet 4   co-enzyme Q-10 30 MG capsule Take 100 mg by mouth daily.     diclofenac Sodium (VOLTAREN) 1 % GEL Apply 2 g topically 2 (two) times daily as needed (pain).     docusate sodium (COLACE) 100 MG capsule Take 1 capsule (100 mg total) by mouth 2 (two) times daily. 30 capsule 0   doxycycline (VIBRA-TABS) 100 MG tablet Take 1 tablet (100 mg total) by mouth 2 (two) times daily. 60 tablet 5   ezetimibe (ZETIA) 10 MG tablet Take 1 tablet (10 mg total) by mouth daily. 90 tablet 3   furosemide (LASIX) 40 MG tablet Take 1 tablet (40 mg total) by mouth daily. 90 tablet 0   gabapentin (NEURONTIN) 400 MG capsule Take 400 mg by mouth 3 (three) times daily.     glipiZIDE (GLUCOTROL) 10 MG tablet Take 10 mg by mouth daily before breakfast.     HYDROcodone-acetaminophen (NORCO/VICODIN) 5-325 MG tablet Take 1 tablet by mouth every 4 (four) hours as needed for moderate pain (pain score 4-6). 30 tablet 0   INS SYRINGE/NEEDLE 1CC/28G (B-D INSULIN SYRINGE 1CC/28G) 28G X 1/2" 1 ML MISC USE TO ADMINISTER INSULIN DAILY 100 each 0   insulin aspart protamine- aspart (NOVOLOG MIX 70/30) (70-30) 100 UNIT/ML injection Inject 0.15 mLs (15 Units total) into the skin 2 (two) times daily with a meal. 10 mL 11    latanoprost (XALATAN) 0.005 % ophthalmic solution Place 1 drop into both eyes at bedtime.  5   losartan (COZAAR) 25 MG tablet Take 0.5 tablets (12.5 mg total) by mouth daily. 30 tablet 5   metolazone (ZAROXOLYN) 2.5 MG tablet Take 1 tablet (2.5 mg total) by mouth as needed (As needed for weight gain). 90 tablet 3   metoprolol succinate (TOPROL-XL) 25 MG 24 hr tablet TAKE 1 TABLET(25 MG) BY MOUTH DAILY 90 tablet 0   Multiple Vitamin (MULTIVITAMIN WITH MINERALS) TABS tablet Take 1 tablet by mouth in the morning.     nitroGLYCERIN (NITROSTAT) 0.4 MG SL tablet PLACE 1 TABLET UNDER THE TONGUE EVERY 5 MINUTES AS NEEDED FOR CHEST PAIN. MAX 3 TABLETS 25 tablet 1   Omega-3 Fatty Acids (FISH OIL) 1000 MG CAPS Take 1,000 mg by mouth daily at 6 (six) AM.     ONE TOUCH ULTRA TEST test strip USE 1 STRIP TO TEST THREE  TIMES DAILY AS DIRECTED 100 each 0   ONETOUCH DELICA LANCETS 99991111 MISC Use three times daily to check blood sugar. 100 each 11   oxyCODONE (OXY IR/ROXICODONE) 5 MG immediate release tablet Take 5 mg by mouth every 4 (four) hours as needed for severe pain.     pantoprazole (PROTONIX) 40 MG tablet Take 40 mg by mouth daily.     rifampin (RIFADIN) 300 MG capsule Take 1 capsule (300 mg total) by mouth every 12 (twelve) hours. 60 capsule 1   rosuvastatin (CRESTOR) 10 MG tablet Take 1 tablet (10 mg total) by mouth daily. 90 tablet 3   spironolactone (ALDACTONE) 25 MG tablet Take 0.5 tablets (12.5 mg total) by mouth daily. 15 tablet 11   tamsulosin (FLOMAX) 0.4 MG CAPS capsule Take 0.4 mg by mouth daily after breakfast.     timolol (TIMOPTIC) 0.5 % ophthalmic solution Place 1 drop into both eyes in the morning.  2   vitamin B-12 (CYANOCOBALAMIN) 500 MCG tablet Take 500 mcg by mouth daily.     vitamin E 180 MG (400 UNITS) capsule Take 400 Units by mouth daily.     No current facility-administered medications for this visit.    Allergies:   Keflex [cephalexin], Jardiance [empagliflozin], Metformin and  related, Trazodone, and Torsemide   Social History:  The patient  reports that he quit smoking about 54 years ago. His smoking use included cigarettes. He has a 16.50 pack-year smoking history. He has never used smokeless tobacco. He reports that he does not drink alcohol and does not use drugs.   Family History:   family history includes Diabetes in his mother.    Review of Systems: Review of Systems  Constitutional: Negative.   HENT: Negative.    Respiratory:  Positive for shortness of breath.   Cardiovascular:  Positive for leg swelling.  Gastrointestinal: Negative.   Musculoskeletal:  Positive for joint pain.  Neurological: Negative.   Psychiatric/Behavioral: Negative.    All other systems reviewed and are negative.   PHYSICAL EXAM: BP 110/60 (BP Location: Left Arm, Patient Position: Sitting, Cuff Size: Normal)   Pulse (!) 56   Ht '5\' 11"'$  (1.803 m)   Wt 195 lb 8 oz (88.7 kg)   SpO2 97%   BMI 27.27 kg/m  Constitutional:  oriented to person, place, and time. No distress.  HENT:  Head: Grossly normal Eyes:  no discharge. No scleral icterus.  Neck: No JVD, no carotid bruits  Cardiovascular: Regular rate and rhythm, no murmurs appreciated Pulmonary/Chest: Clear to auscultation bilaterally, no wheezes or rails Abdominal: Soft.  no distension.  no tenderness.  Musculoskeletal: Normal range of motion Neurological:  normal muscle tone. Coordination normal. No atrophy Skin: Skin warm and dry Psychiatric: normal affect, pleasant  Recent Labs: 01/17/2022: ALT 16; BUN 38; Creatinine, Ser 0.96; Hemoglobin 12.6; Platelets 178; Potassium 4.2; Sodium 138    Lipid Panel Lab Results  Component Value Date   CHOL 53 08/10/2021   HDL 13 (L) 08/10/2021   LDLCALC 29 08/10/2021   TRIG 54 08/10/2021      Wt Readings from Last 3 Encounters:  12/20/22 195 lb 8 oz (88.7 kg)  08/13/22 196 lb (88.9 kg)  05/03/22 191 lb 8 oz (86.9 kg)     ASSESSMENT AND PLAN:  Type 2 diabetes,  uncontrolled, with peripheral circulatory disorder (HCC) Stressed importance of aggressive diabetes control, A1c 8 Off jardiance secondary to cost, possibly donut hole Will try farxiga 10 mg daily  PAD (peripheral artery  disease) ([place]) Diffuse aortic atherosclerosis noted, moderate degree Severe carotid disease, followed by vascular Cholesterol at goal, on antiplatelet therapy, stressed importance of aggressive diabetes control  Carotid artery stenosis with cerebral infarction [facility]) Stressed importance of aggressive diabetes control, lipid control  Coronary artery disease involving native coronary artery of native heart without angina pectoris Underwent high risk stenting LAD Aspirin Plavix Denies anginal symptoms  chronic systolic CHF/cardiomyopathy Recommend he continue Lasix 40 daily, spironolactone 12.5 daily, metoprolol succinate 25 daily, losartan 25 daily Will try for Farxiga 10 mg daily as he reports Jardiance was expensive  Frequent PVCs On metoprolol, appears he is off amiodarone   Total encounter time more than 30 minutes  Greater than 50% was spent in counseling and coordination of care with the patient   No orders of the defined types were placed in this encounter.    Signed, Esmond Plants, M.D., Ph.D. 12/20/2022  [facility], [place]

## 2022-12-20 ENCOUNTER — Encounter: Payer: Self-pay | Admitting: Cardiovascular Disease

## 2022-12-20 ENCOUNTER — Ambulatory Visit: Payer: Medicare Other | Attending: Cardiovascular Disease | Admitting: Cardiovascular Disease

## 2022-12-20 VITALS — BP 110/60 | HR 56 | Ht 71.0 in | Wt 195.5 lb

## 2022-12-20 DIAGNOSIS — I739 Peripheral vascular disease, unspecified: Secondary | ICD-10-CM

## 2022-12-20 DIAGNOSIS — I10 Essential (primary) hypertension: Secondary | ICD-10-CM | POA: Diagnosis not present

## 2022-12-20 DIAGNOSIS — I255 Ischemic cardiomyopathy: Secondary | ICD-10-CM

## 2022-12-20 DIAGNOSIS — E1122 Type 2 diabetes mellitus with diabetic chronic kidney disease: Secondary | ICD-10-CM

## 2022-12-20 DIAGNOSIS — I5022 Chronic systolic (congestive) heart failure: Secondary | ICD-10-CM

## 2022-12-20 DIAGNOSIS — I2510 Atherosclerotic heart disease of native coronary artery without angina pectoris: Secondary | ICD-10-CM | POA: Diagnosis not present

## 2022-12-20 DIAGNOSIS — E785 Hyperlipidemia, unspecified: Secondary | ICD-10-CM

## 2022-12-20 DIAGNOSIS — Z794 Long term (current) use of insulin: Secondary | ICD-10-CM

## 2022-12-20 DIAGNOSIS — N181 Chronic kidney disease, stage 1: Secondary | ICD-10-CM

## 2022-12-20 DIAGNOSIS — I890 Lymphedema, not elsewhere classified: Secondary | ICD-10-CM

## 2022-12-20 DIAGNOSIS — I272 Pulmonary hypertension, unspecified: Secondary | ICD-10-CM

## 2022-12-20 MED ORDER — DAPAGLIFLOZIN PROPANEDIOL 10 MG PO TABS: 10.0000 mg | ORAL_TABLET | Freq: Every day | ORAL | 10 refills | Status: DC

## 2022-12-20 NOTE — Patient Instructions (Addendum)
Medication Instructions:  Change jardiance to farxiga 10 mg daily  Stop potassium  If you need a refill on your cardiac medications before your next appointment, please call your pharmacy.   Lab work: No new labs needed  Testing/Procedures: No new testing needed  Follow-Up: At [facility], you and your health needs are our priority.  As part of our continuing mission to provide you with exceptional heart care, we have created designated Provider Care Teams.  These Care Teams include your primary Cardiologist (physician) and Advanced Practice Providers (APPs -  Physician Assistants and Nurse Practitioners) who all work together to provide you with the care you need, when you need it.  You will need a follow up appointment in 6 months  Providers on your designated Care Team:   Murray Hodgkins, NP Christell Faith, PA-C Cadence Kathlen Mody, [place]  COVID-19 Vaccine Information can be found at: [email or web address] For questions related to vaccine distribution or appointments, please email [email or web address] or call [phone].

## 2023-01-06 ENCOUNTER — Other Ambulatory Visit: Payer: Self-pay | Admitting: Cardiovascular Disease

## 2023-01-06 ENCOUNTER — Other Ambulatory Visit: Payer: Self-pay | Admitting: Family

## 2023-01-07 ENCOUNTER — Telehealth: Payer: Self-pay | Admitting: Cardiovascular Disease

## 2023-01-07 NOTE — Telephone Encounter (Signed)
   Patient Name: Henry Lindsey  DOB: 01/04/1933 MRN: [id number]  Primary Cardiologist: Ida Rogue, MD  Chart reviewed as part of pre-operative protocol coverage.   Dental extractions of 1-2 teeth are considered low risk procedures per guidelines and generally do not require any specific cardiac clearance. It is also generally accepted that for extractions of 1-2 teeth and dental cleanings, there is no need to interrupt blood thinner therapy.  SBE prophylaxis is not required for the patient from a cardiac standpoint.  I will route this recommendation to the requesting party via Epic fax function and remove from pre-op pool.  Please call with questions.  Mayra Reel, NP 01/07/2023, 3:43 PM

## 2023-01-07 NOTE — Telephone Encounter (Signed)
   Pre-operative Risk Assessment    Patient Name: Henry Lindsey  DOB: 05-Feb-1933 MRN: [id number]     Request for Surgical Clearance    Procedure:  Dental Extraction - Amount of Teeth to be Pulled:  2  Date of Surgery:  Clearance TBD                                 Surgeon:  Dr. Gillian Shields  Surgeon's Group or Practice Name:  [facility] Phone number:  [phone] Fax number:  [phone]   Type of Clearance Requested:   - Medical  - Pharmacy:  Hold Clopidogrel (Plavix)     Type of Anesthesia:  Local    Additional requests/questions:  Does this patient need antibiotics?  Signed, April Henson   01/07/2023, 1:10 PM

## 2023-01-09 ENCOUNTER — Other Ambulatory Visit: Payer: Self-pay | Admitting: Cardiovascular Disease

## 2023-01-14 ENCOUNTER — Other Ambulatory Visit: Payer: Self-pay | Admitting: Cardiovascular Disease

## 2023-01-14 ENCOUNTER — Telehealth: Payer: Self-pay | Admitting: Cardiovascular Disease

## 2023-01-14 NOTE — Telephone Encounter (Signed)
Henry Lindsey with [facility] states clearance has not been received. She is requesting to have it sent again if possible.   Phone number:  [phone] Fax number:  [phone]

## 2023-01-14 NOTE — Telephone Encounter (Signed)
I will re-fax clearance for dental procedure.

## 2023-01-14 NOTE — Telephone Encounter (Signed)
Dental office called back asking about clearance. I asked if they listed to my vm that I left earlier that the fax came back no answer. Their fax machine was not responding. I was asked if I could email the clearance. I did state that we do not generally email clearance notes, though I will this time as the fax is not going through.   I was given email: [email or web address]

## 2023-01-14 NOTE — Telephone Encounter (Signed)
I left message for the DDS office that clearance was faxed on 01/07/23 by Mayra Reel, NP. I tried to re-fax notes today manual fax and page came back status no answer. I called the [facility] office that their fax machine is not responding. Call our office back if there is another fax # we can use.

## 2023-02-07 ENCOUNTER — Emergency Department: Payer: Medicare Other

## 2023-02-07 ENCOUNTER — Ambulatory Visit: Payer: Medicare Other

## 2023-02-07 ENCOUNTER — Other Ambulatory Visit: Payer: Self-pay

## 2023-02-07 ENCOUNTER — Inpatient Hospital Stay: Payer: Medicare Other

## 2023-02-07 ENCOUNTER — Encounter: Admission: EM | Disposition: E | Payer: Self-pay | Source: Home / Self Care | Attending: Pulmonary Disease

## 2023-02-07 ENCOUNTER — Encounter: Payer: Self-pay | Admitting: Certified Registered Nurse Anesthetist

## 2023-02-07 ENCOUNTER — Inpatient Hospital Stay
Admission: EM | Admit: 2023-02-07 | Discharge: 2023-02-19 | DRG: 264 | Disposition: E | Payer: Medicare Other | Attending: Pulmonary Disease | Admitting: Pulmonary Disease

## 2023-02-07 DIAGNOSIS — Z87891 Personal history of nicotine dependence: Secondary | ICD-10-CM

## 2023-02-07 DIAGNOSIS — G931 Anoxic brain damage, not elsewhere classified: Secondary | ICD-10-CM | POA: Diagnosis present

## 2023-02-07 DIAGNOSIS — Z881 Allergy status to other antibiotic agents status: Secondary | ICD-10-CM

## 2023-02-07 DIAGNOSIS — Z8673 Personal history of transient ischemic attack (TIA), and cerebral infarction without residual deficits: Secondary | ICD-10-CM

## 2023-02-07 DIAGNOSIS — I469 Cardiac arrest, cause unspecified: Principal | ICD-10-CM | POA: Diagnosis present

## 2023-02-07 DIAGNOSIS — I700 Atherosclerosis of aorta: Secondary | ICD-10-CM | POA: Diagnosis present

## 2023-02-07 DIAGNOSIS — G253 Myoclonus: Secondary | ICD-10-CM | POA: Diagnosis not present

## 2023-02-07 DIAGNOSIS — J9602 Acute respiratory failure with hypercapnia: Secondary | ICD-10-CM | POA: Diagnosis present

## 2023-02-07 DIAGNOSIS — N179 Acute kidney failure, unspecified: Secondary | ICD-10-CM | POA: Diagnosis present

## 2023-02-07 DIAGNOSIS — Z794 Long term (current) use of insulin: Secondary | ICD-10-CM

## 2023-02-07 DIAGNOSIS — I2119 ST elevation (STEMI) myocardial infarction involving other coronary artery of inferior wall: Secondary | ICD-10-CM | POA: Diagnosis present

## 2023-02-07 DIAGNOSIS — Z7189 Other specified counseling: Secondary | ICD-10-CM | POA: Diagnosis not present

## 2023-02-07 DIAGNOSIS — I255 Ischemic cardiomyopathy: Secondary | ICD-10-CM | POA: Diagnosis present

## 2023-02-07 DIAGNOSIS — K219 Gastro-esophageal reflux disease without esophagitis: Secondary | ICD-10-CM | POA: Diagnosis present

## 2023-02-07 DIAGNOSIS — I252 Old myocardial infarction: Secondary | ICD-10-CM

## 2023-02-07 DIAGNOSIS — I110 Hypertensive heart disease with heart failure: Secondary | ICD-10-CM | POA: Diagnosis present

## 2023-02-07 DIAGNOSIS — R000 Tachycardia, unspecified: Secondary | ICD-10-CM | POA: Diagnosis present

## 2023-02-07 DIAGNOSIS — R570 Cardiogenic shock: Secondary | ICD-10-CM | POA: Diagnosis present

## 2023-02-07 DIAGNOSIS — D649 Anemia, unspecified: Secondary | ICD-10-CM | POA: Diagnosis present

## 2023-02-07 DIAGNOSIS — Z9862 Peripheral vascular angioplasty status: Secondary | ICD-10-CM

## 2023-02-07 DIAGNOSIS — I4510 Unspecified right bundle-branch block: Secondary | ICD-10-CM | POA: Diagnosis present

## 2023-02-07 DIAGNOSIS — J449 Chronic obstructive pulmonary disease, unspecified: Secondary | ICD-10-CM | POA: Diagnosis present

## 2023-02-07 DIAGNOSIS — Z79899 Other long term (current) drug therapy: Secondary | ICD-10-CM

## 2023-02-07 DIAGNOSIS — Z7984 Long term (current) use of oral hypoglycemic drugs: Secondary | ICD-10-CM

## 2023-02-07 DIAGNOSIS — D696 Thrombocytopenia, unspecified: Secondary | ICD-10-CM | POA: Diagnosis present

## 2023-02-07 DIAGNOSIS — I2510 Atherosclerotic heart disease of native coronary artery without angina pectoris: Secondary | ICD-10-CM | POA: Diagnosis present

## 2023-02-07 DIAGNOSIS — N400 Enlarged prostate without lower urinary tract symptoms: Secondary | ICD-10-CM | POA: Diagnosis present

## 2023-02-07 DIAGNOSIS — I462 Cardiac arrest due to underlying cardiac condition: Secondary | ICD-10-CM | POA: Diagnosis present

## 2023-02-07 DIAGNOSIS — I5042 Chronic combined systolic (congestive) and diastolic (congestive) heart failure: Secondary | ICD-10-CM | POA: Diagnosis present

## 2023-02-07 DIAGNOSIS — E785 Hyperlipidemia, unspecified: Secondary | ICD-10-CM | POA: Diagnosis present

## 2023-02-07 DIAGNOSIS — Z66 Do not resuscitate: Secondary | ICD-10-CM | POA: Diagnosis present

## 2023-02-07 DIAGNOSIS — E1151 Type 2 diabetes mellitus with diabetic peripheral angiopathy without gangrene: Secondary | ICD-10-CM | POA: Diagnosis present

## 2023-02-07 DIAGNOSIS — J9601 Acute respiratory failure with hypoxia: Secondary | ICD-10-CM | POA: Diagnosis present

## 2023-02-07 DIAGNOSIS — Z7982 Long term (current) use of aspirin: Secondary | ICD-10-CM

## 2023-02-07 DIAGNOSIS — Z7902 Long term (current) use of antithrombotics/antiplatelets: Secondary | ICD-10-CM

## 2023-02-07 DIAGNOSIS — Z515 Encounter for palliative care: Secondary | ICD-10-CM

## 2023-02-07 DIAGNOSIS — E872 Acidosis, unspecified: Secondary | ICD-10-CM | POA: Diagnosis present

## 2023-02-07 DIAGNOSIS — Z96651 Presence of right artificial knee joint: Secondary | ICD-10-CM | POA: Diagnosis present

## 2023-02-07 DIAGNOSIS — Z833 Family history of diabetes mellitus: Secondary | ICD-10-CM

## 2023-02-07 DIAGNOSIS — Z888 Allergy status to other drugs, medicaments and biological substances status: Secondary | ICD-10-CM

## 2023-02-07 LAB — CBC WITH DIFFERENTIAL/PLATELET
Abs Immature Granulocytes: 0.31 10*3/uL — ABNORMAL HIGH (ref 0.00–0.07)
Abs Immature Granulocytes: 0.55 10*3/uL — ABNORMAL HIGH (ref 0.00–0.07)
Basophils Absolute: 0 10*3/uL (ref 0.0–0.1)
Basophils Absolute: 0.1 10*3/uL (ref 0.0–0.1)
Basophils Relative: 0 %
Basophils Relative: 0 %
Eosinophils Absolute: 0 10*3/uL (ref 0.0–0.5)
Eosinophils Absolute: 0.1 10*3/uL (ref 0.0–0.5)
Eosinophils Relative: 0 %
Eosinophils Relative: 0 %
HCT: 45.1 % (ref 39.0–52.0)
HCT: 45.8 % (ref 39.0–52.0)
Hemoglobin: 12.8 g/dL — ABNORMAL LOW (ref 13.0–17.0)
Hemoglobin: 13.9 g/dL (ref 13.0–17.0)
Immature Granulocytes: 3 %
Immature Granulocytes: 4 %
Lymphocytes Relative: 53 %
Lymphocytes Relative: 29 %
Lymphs Abs: 5.1 10*3/uL — ABNORMAL HIGH (ref 0.7–4.0)
Lymphs Abs: 3.9 10*3/uL (ref 0.7–4.0)
MCH: 28.9 pg (ref 26.0–34.0)
MCH: 29.9 pg (ref 26.0–34.0)
MCHC: 28.4 g/dL — ABNORMAL LOW (ref 30.0–36.0)
MCHC: 30.3 g/dL (ref 30.0–36.0)
MCV: 101.8 fL — ABNORMAL HIGH (ref 80.0–100.0)
MCV: 98.5 fL (ref 80.0–100.0)
Monocytes Absolute: 0.3 10*3/uL (ref 0.1–1.0)
Monocytes Absolute: 0.3 10*3/uL (ref 0.1–1.0)
Monocytes Relative: 4 %
Monocytes Relative: 2 %
Neutro Abs: 3.9 10*3/uL (ref 1.7–7.7)
Neutro Abs: 8.6 10*3/uL — ABNORMAL HIGH (ref 1.7–7.7)
Neutrophils Relative %: 40 %
Neutrophils Relative %: 65 %
Platelets: 111 10*3/uL — ABNORMAL LOW (ref 150–400)
Platelets: 185 10*3/uL (ref 150–400)
RBC: 4.43 MIL/uL (ref 4.22–5.81)
RBC: 4.65 MIL/uL (ref 4.22–5.81)
RDW: 15 % (ref 11.5–15.5)
RDW: 15.1 % (ref 11.5–15.5)
WBC: 9.7 10*3/uL (ref 4.0–10.5)
WBC: 13.4 10*3/uL — ABNORMAL HIGH (ref 4.0–10.5)
nRBC: 0.2 % (ref 0.0–0.2)
nRBC: 0.2 % (ref 0.0–0.2)

## 2023-02-07 LAB — COMPREHENSIVE METABOLIC PANEL
ALT: 96 U/L — ABNORMAL HIGH (ref 0–44)
AST: 114 U/L — ABNORMAL HIGH (ref 15–41)
Albumin: 3.2 g/dL — ABNORMAL LOW (ref 3.5–5.0)
Alkaline Phosphatase: 57 U/L (ref 38–126)
Anion gap: 15 (ref 5–15)
BUN: 48 mg/dL — ABNORMAL HIGH (ref 8–23)
CO2: 20 mmol/L — ABNORMAL LOW (ref 22–32)
Calcium: 7.9 mg/dL — ABNORMAL LOW (ref 8.9–10.3)
Chloride: 101 mmol/L (ref 98–111)
Creatinine, Ser: 1.88 mg/dL — ABNORMAL HIGH (ref 0.61–1.24)
GFR, Estimated: 34 mL/min — ABNORMAL LOW (ref >60)
Glucose, Bld: 431 mg/dL — ABNORMAL HIGH (ref 70–99)
Potassium: 4.2 mmol/L (ref 3.5–5.1)
Sodium: 136 mmol/L (ref 135–145)
Total Bilirubin: 0.9 mg/dL (ref 0.3–1.2)
Total Protein: 5.7 g/dL — ABNORMAL LOW (ref 6.5–8.1)

## 2023-02-07 LAB — URINALYSIS, COMPLETE (UACMP) WITH MICROSCOPIC
Bacteria, UA: NONE SEEN
Bilirubin Urine: NEGATIVE
Glucose, UA: >=500 mg/dL — AB
Ketones, ur: NEGATIVE mg/dL
Leukocytes,Ua: NEGATIVE
Nitrite: NEGATIVE
Protein, ur: NEGATIVE mg/dL
Specific Gravity, Urine: 1.015 (ref 1.005–1.030)
pH: 5 (ref 5.0–8.0)

## 2023-02-07 LAB — BLOOD GAS, ARTERIAL
Acid-base deficit: 16.7 mmol/L — ABNORMAL HIGH (ref 0.0–2.0)
Acid-base deficit: 11 mmol/L — ABNORMAL HIGH (ref 0.0–2.0)
Acid-base deficit: 5.6 mmol/L — ABNORMAL HIGH (ref 0.0–2.0)
Bicarbonate: 17.2 mmol/L — ABNORMAL LOW (ref 20.0–28.0)
Bicarbonate: 14.7 mmol/L — ABNORMAL LOW (ref 20.0–28.0)
FIO2: 100 %
FIO2: 50 %
FIO2: 50 %
MECHVT: 500 mL
MECHVT: 600 mL
MECHVT: 600 mL
Mechanical Rate: 20
Mechanical Rate: 24
O2 Saturation: 100 %
O2 Saturation: 100 %
PEEP: 10 cmH2O
PEEP: 10 cmH2O
Patient temperature: 37
Patient temperature: 37
pCO2 arterial: 84 mmHg (ref 32–48)
pCO2 arterial: 32 mmHg (ref 32–48)
pH, Arterial: <6.95 — CL (ref 7.35–7.45)
pH, Arterial: 7.27 — ABNORMAL LOW (ref 7.35–7.45)
pO2, Arterial: 208 mmHg — ABNORMAL HIGH (ref 83–108)
pO2, Arterial: 226 mmHg — ABNORMAL HIGH (ref 83–108)
pO2, Arterial: 203 mmHg — ABNORMAL HIGH (ref 83–108)

## 2023-02-07 LAB — LACTIC ACID, PLASMA
Lactic Acid, Venous: 5 mmol/L (ref 0.5–1.9)
Lactic Acid, Venous: >9 mmol/L (ref 0.5–1.9)
Lactic Acid, Venous: 7.1 mmol/L (ref 0.5–1.9)
Lactic Acid, Venous: 5.5 mmol/L (ref 0.5–1.9)

## 2023-02-07 LAB — BETA-HYDROXYBUTYRIC ACID: Beta-Hydroxybutyric Acid: 0.4 mmol/L — ABNORMAL HIGH (ref 0.05–0.27)

## 2023-02-07 LAB — URINE DRUG SCREEN, QUALITATIVE (ARMC ONLY)
Amphetamines, Ur Screen: NOT DETECTED
Barbiturates, Ur Screen: NOT DETECTED
Benzodiazepine, Ur Scrn: NOT DETECTED
Cannabinoid 50 Ng, Ur ~~LOC~~: NOT DETECTED
Cocaine Metabolite,Ur ~~LOC~~: NOT DETECTED
MDMA (Ecstasy)Ur Screen: NOT DETECTED
Methadone Scn, Ur: NOT DETECTED
Opiate, Ur Screen: POSITIVE — AB
Phencyclidine (PCP) Ur S: NOT DETECTED
Tricyclic, Ur Screen: NOT DETECTED

## 2023-02-07 LAB — TROPONIN I (HIGH SENSITIVITY)
Troponin I (High Sensitivity): 81 ng/L — ABNORMAL HIGH (ref <18)
Troponin I (High Sensitivity): 613 ng/L (ref <18)
Troponin I (High Sensitivity): 4565 ng/L (ref <18)
Troponin I (High Sensitivity): >24000 ng/L (ref <18)

## 2023-02-07 LAB — BASIC METABOLIC PANEL
Anion gap: 20 — ABNORMAL HIGH (ref 5–15)
BUN: 55 mg/dL — ABNORMAL HIGH (ref 8–23)
CO2: 14 mmol/L — ABNORMAL LOW (ref 22–32)
Calcium: 7.7 mg/dL — ABNORMAL LOW (ref 8.9–10.3)
Chloride: 102 mmol/L (ref 98–111)
Creatinine, Ser: 2.22 mg/dL — ABNORMAL HIGH (ref 0.61–1.24)
GFR, Estimated: 28 mL/min — ABNORMAL LOW (ref >60)
Glucose, Bld: 476 mg/dL — ABNORMAL HIGH (ref 70–99)
Potassium: 4.1 mmol/L (ref 3.5–5.1)
Sodium: 136 mmol/L (ref 135–145)

## 2023-02-07 LAB — GLUCOSE, CAPILLARY
Glucose-Capillary: 411 mg/dL — ABNORMAL HIGH (ref 70–99)
Glucose-Capillary: 464 mg/dL — ABNORMAL HIGH (ref 70–99)

## 2023-02-07 LAB — PROTIME-INR
INR: 1.4 — ABNORMAL HIGH (ref 0.8–1.2)
Prothrombin Time: 17.4 seconds — ABNORMAL HIGH (ref 11.4–15.2)

## 2023-02-07 LAB — MRSA NEXT GEN BY PCR, NASAL: MRSA by PCR Next Gen: NOT DETECTED

## 2023-02-07 LAB — APTT: aPTT: 35 seconds (ref 24–36)

## 2023-02-07 LAB — PROCALCITONIN: Procalcitonin: <0.1 ng/mL

## 2023-02-07 LAB — HEMOGLOBIN A1C
Hgb A1c MFr Bld: 8.7 % — ABNORMAL HIGH (ref 4.8–5.6)
Mean Plasma Glucose: 202.99 mg/dL

## 2023-02-07 SURGERY — CORONARY/GRAFT ACUTE MI REVASCULARIZATION
Anesthesia: Moderate Sedation

## 2023-02-07 MED ORDER — PANTOPRAZOLE SODIUM 40 MG IV SOLR
40.0000 mg | Freq: Every day | INTRAVENOUS | Status: DC
  Administered 2023-02-07: 40 mg via INTRAVENOUS
  Filled 2023-02-07: qty 10

## 2023-02-07 MED ORDER — ORAL CARE MOUTH RINSE
15.0000 mL | OROMUCOSAL | Status: DC
  Administered 2023-02-07 – 2023-02-08 (×7): 15 mL via OROMUCOSAL

## 2023-02-07 MED ORDER — ONDANSETRON HCL 4 MG/2ML IJ SOLN: 4.0000 mg | Freq: Four times a day (QID) | INTRAMUSCULAR | Status: DC | PRN

## 2023-02-07 MED ORDER — ACETAMINOPHEN 650 MG RE SUPP: 650.0000 mg | RECTAL | Status: DC

## 2023-02-07 MED ORDER — NOREPINEPHRINE 16 MG/250ML-% IV SOLN
0.0000 ug/min | INTRAVENOUS | Status: DC
  Administered 2023-02-07: 2 ug/min via INTRAVENOUS
  Administered 2023-02-08: 30 ug/min via INTRAVENOUS
  Filled 2023-02-07 (×2): qty 250

## 2023-02-07 MED ORDER — ACETAMINOPHEN 160 MG/5ML PO SOLN
650.0000 mg | ORAL | Status: DC
  Filled 2023-02-07 (×6): qty 20.3

## 2023-02-07 MED ORDER — MIDAZOLAM HCL 2 MG/2ML IJ SOLN
INTRAMUSCULAR | Status: AC
  Filled 2023-02-07: qty 2

## 2023-02-07 MED ORDER — HEPARIN (PORCINE) 25000 UT/250ML-% IV SOLN
1050.0000 [IU]/h | INTRAVENOUS | Status: DC
  Administered 2023-02-07: 1050 [IU]/h via INTRAVENOUS
  Filled 2023-02-07: qty 250

## 2023-02-07 MED ORDER — MIDAZOLAM HCL (PF) 5 MG/ML IJ SOLN: 2.0000 mg | Freq: Once | INTRAMUSCULAR | Status: DC

## 2023-02-07 MED ORDER — FENTANYL 2500MCG IN NS 250ML (10MCG/ML) PREMIX INFUSION: 25.0000 ug/h | INTRAVENOUS | Status: DC

## 2023-02-07 MED ORDER — LIDOCAINE BOLUS VIA INFUSION
2.0000 mg/kg | Freq: Once | INTRAVENOUS | Status: AC
  Administered 2023-02-07: 150.6 mg via INTRAVENOUS
  Filled 2023-02-07: qty 152

## 2023-02-07 MED ORDER — MIDAZOLAM-SODIUM CHLORIDE 100-0.9 MG/100ML-% IV SOLN
0.5000 mg/h | INTRAVENOUS | Status: DC
  Administered 2023-02-07: 2 mg/h via INTRAVENOUS
  Filled 2023-02-07: qty 100

## 2023-02-07 MED ORDER — INSULIN ASPART 100 UNIT/ML IJ SOLN
0.0000 [IU] | INTRAMUSCULAR | Status: DC
  Administered 2023-02-07: 20 [IU] via SUBCUTANEOUS
  Administered 2023-02-08: 11 [IU] via SUBCUTANEOUS
  Administered 2023-02-08: 7 [IU] via SUBCUTANEOUS
  Administered 2023-02-08: 15 [IU] via SUBCUTANEOUS
  Filled 2023-02-07 (×4): qty 1

## 2023-02-07 MED ORDER — CALCIUM GLUCONATE-NACL 1-0.675 GM/50ML-% IV SOLN
1.0000 g | Freq: Once | INTRAVENOUS | Status: AC
  Administered 2023-02-07: 1000 mg via INTRAVENOUS
  Filled 2023-02-07: qty 50

## 2023-02-07 MED ORDER — MAGNESIUM SULFATE 2 GM/50ML IV SOLN
2.0000 g | Freq: Once | INTRAVENOUS | Status: AC
  Administered 2023-02-07: 2 g via INTRAVENOUS
  Filled 2023-02-07: qty 50

## 2023-02-07 MED ORDER — ACETAMINOPHEN 325 MG PO TABS
650.0000 mg | ORAL_TABLET | ORAL | Status: DC
  Administered 2023-02-07 – 2023-02-08 (×3): 650 mg via ORAL
  Filled 2023-02-07 (×3): qty 2

## 2023-02-07 MED ORDER — FENTANYL BOLUS VIA INFUSION: 25.0000 ug | INTRAVENOUS | Status: DC | PRN

## 2023-02-07 MED ORDER — LACTATED RINGERS IV BOLUS
1000.0000 mL | Freq: Once | INTRAVENOUS | Status: AC
  Administered 2023-02-07: 1000 mL via INTRAVENOUS

## 2023-02-07 MED ORDER — EPINEPHRINE 1 MG/10ML IJ SOSY
PREFILLED_SYRINGE | INTRAMUSCULAR | Status: AC | PRN
  Administered 2023-02-07 (×2): 1 mg via INTRAVENOUS

## 2023-02-07 MED ORDER — ACETAMINOPHEN 325 MG PO TABS: 650.0000 mg | ORAL_TABLET | ORAL | Status: DC | PRN

## 2023-02-07 MED ORDER — ASPIRIN 81 MG PO TBEC
81.0000 mg | DELAYED_RELEASE_TABLET | Freq: Every day | ORAL | Status: DC
  Administered 2023-02-08: 81 mg via ORAL
  Filled 2023-02-07: qty 1

## 2023-02-07 MED ORDER — CLOPIDOGREL BISULFATE 75 MG PO TABS
75.0000 mg | ORAL_TABLET | Freq: Every day | ORAL | Status: DC
  Administered 2023-02-07 – 2023-02-08 (×2): 75 mg
  Filled 2023-02-07 (×2): qty 1

## 2023-02-07 MED ORDER — ATROPINE SULFATE 1 MG/10ML IJ SOSY
PREFILLED_SYRINGE | INTRAMUSCULAR | Status: AC | PRN
  Administered 2023-02-07: 1 mg via INTRAVENOUS

## 2023-02-07 MED ORDER — INSULIN ASPART 100 UNIT/ML IJ SOLN
0.0000 [IU] | INTRAMUSCULAR | Status: DC
  Administered 2023-02-07: 15 [IU] via SUBCUTANEOUS
  Filled 2023-02-07: qty 1

## 2023-02-07 MED ORDER — PROPOFOL 1000 MG/100ML IV EMUL
0.0000 ug/kg/min | INTRAVENOUS | Status: DC
  Administered 2023-02-07: 10 ug/kg/min via INTRAVENOUS
  Administered 2023-02-08: 15 ug/kg/min via INTRAVENOUS
  Filled 2023-02-07 (×2): qty 100

## 2023-02-07 MED ORDER — DOPAMINE-DEXTROSE 3.2-5 MG/ML-% IV SOLN
0.0000 ug/kg/min | INTRAVENOUS | Status: DC
  Administered 2023-02-07: 10 ug/kg/min via INTRAVENOUS

## 2023-02-07 MED ORDER — ACETAMINOPHEN 650 MG RE SUPP: 650.0000 mg | RECTAL | Status: DC | PRN

## 2023-02-07 MED ORDER — LIDOCAINE HCL 1 % IJ SOLN
INTRAMUSCULAR | Status: AC
  Filled 2023-02-07: qty 20

## 2023-02-07 MED ORDER — SODIUM BICARBONATE 8.4 % IV SOLN
100.0000 meq | Freq: Once | INTRAVENOUS | Status: AC
  Administered 2023-02-07: 100 meq via INTRAVENOUS
  Filled 2023-02-07: qty 100

## 2023-02-07 MED ORDER — CHLORHEXIDINE GLUCONATE CLOTH 2 % EX PADS
6.0000 | MEDICATED_PAD | Freq: Every day | CUTANEOUS | Status: DC
  Administered 2023-02-08: 6 via TOPICAL

## 2023-02-07 MED ORDER — SODIUM BICARBONATE 8.4 % IV SOLN
50.0000 meq | Freq: Once | INTRAVENOUS | Status: AC
  Administered 2023-02-07: 50 meq via INTRAVENOUS
  Filled 2023-02-07: qty 50

## 2023-02-07 MED ORDER — ASPIRIN 300 MG RE SUPP
300.0000 mg | Freq: Once | RECTAL | Status: AC
  Administered 2023-02-07: 300 mg via RECTAL
  Filled 2023-02-07: qty 1

## 2023-02-07 MED ORDER — ORAL CARE MOUTH RINSE: 15.0000 mL | OROMUCOSAL | Status: DC | PRN

## 2023-02-07 MED ORDER — LIDOCAINE IN D5W 4-5 MG/ML-% IV SOLN
1.0000 mg/min | INTRAVENOUS | Status: DC
  Administered 2023-02-07: 1 mg/min via INTRAVENOUS
  Filled 2023-02-07: qty 500

## 2023-02-07 MED ORDER — SODIUM CHLORIDE 0.9 % IV BOLUS
1000.0000 mL | Freq: Once | INTRAVENOUS | Status: AC
  Administered 2023-02-07: 1000 mL via INTRAVENOUS

## 2023-02-07 MED ORDER — HEPARIN BOLUS VIA INFUSION
4000.0000 [IU] | Freq: Once | INTRAVENOUS | Status: AC
  Administered 2023-02-07: 4000 [IU] via INTRAVENOUS
  Filled 2023-02-07: qty 4000

## 2023-02-07 MED ORDER — AMIODARONE IV BOLUS ONLY 150 MG/100ML
INTRAVENOUS | Status: AC
  Administered 2023-02-07: 150 mg via INTRAVENOUS
  Filled 2023-02-07: qty 100

## 2023-02-07 MED ORDER — AMIODARONE IV BOLUS ONLY 150 MG/100ML: 150.0000 mg | Freq: Once | INTRAVENOUS | Status: AC

## 2023-02-07 MED ORDER — MIDAZOLAM HCL 2 MG/2ML IJ SOLN: 2.0000 mg | Freq: Once | INTRAMUSCULAR | Status: DC

## 2023-02-07 MED ORDER — EPINEPHRINE HCL 5 MG/250ML IV SOLN IN NS
0.5000 ug/min | INTRAVENOUS | Status: DC
  Administered 2023-02-07: 4 ug/min via INTRAVENOUS
  Administered 2023-02-07: 10 ug/min via INTRAVENOUS
  Filled 2023-02-07 (×2): qty 250

## 2023-02-07 MED ORDER — DOCUSATE SODIUM 50 MG/5ML PO LIQD
100.0000 mg | Freq: Two times a day (BID) | ORAL | Status: DC
  Administered 2023-02-08: 100 mg
  Filled 2023-02-07: qty 10

## 2023-02-07 MED ORDER — POLYETHYLENE GLYCOL 3350 17 G PO PACK
17.0000 g | PACK | Freq: Every day | ORAL | Status: DC
  Administered 2023-02-07 – 2023-02-08 (×2): 17 g
  Filled 2023-02-07 (×2): qty 1

## 2023-02-07 MED ORDER — ACETAMINOPHEN 160 MG/5ML PO SOLN: 650.0000 mg | ORAL | Status: DC | PRN

## 2023-02-07 NOTE — Progress Notes (Signed)
At 1800 central venous catheter inserted by NP after time out and appropriate set up.

## 2023-02-07 NOTE — ED Notes (Addendum)
Family at bedside with ICU MD. Per Elvina Sidle, NP patient now DNR. Awaiting arrival of daughter for further decisions.

## 2023-02-07 NOTE — ED Notes (Signed)
Pt lost pulses, CPR resumed at this time.

## 2023-02-07 NOTE — ED Notes (Signed)
Activated Code Stemi w/[facility] spoke with [place]

## 2023-02-07 NOTE — ED Notes (Signed)
Patient belongings taken by daughter, Sonny Masters.

## 2023-02-07 NOTE — ED Provider Notes (Signed)
[facility] Provider Note    Event Date/Time   First MD Initiated Contact with Patient 01/28/2023 1438     (approximate)   History   Chief Complaint: Post Arrest    HPI  Henry Lindsey is a 89 y.o. male with a past history of hypertension, diabetes, stroke, CAD, heart failure, COPD who was brought to the ED due to cardiac arrest.  EMS reports that they were called by construction workers at the patient's home who found him unconscious on the ground.  Bystander CPR was not performed.  They reported that they had seen him apparently normal just 10 minutes prior.  EMS reports that initial rhythm was PEA, they did 2 minutes of CPR which included 2 electrocardioversion's and 2 A of epi, and he had ROSC.  As they are moving the patient to the ambulance for transport, he again arrested, and again had ROSC after 2 minutes of ACLS.  Patient remained unconscious after ROSC, and I-gel advanced airway was placed in the field.     Physical Exam   Triage Vital Signs: ED Triage Vitals  Enc Vitals Group     BP 02/15/2023 1415 130/60     Pulse Rate 01/20/2023 1415 62     Resp 01/25/2023 1430 17     Temp 02/14/2023 1445 (!) 96.1 F (35.6 C)     Temp src --      SpO2 01/27/2023 1415 95 %     Weight 02/06/2023 1421 194 lb 10.7 oz (88.3 kg)     Height --      Head Circumference --      Peak Flow --      Pain Score --      Pain Loc --      Pain Edu? --      Excl. in GC? --     Most recent vital signs: Vitals:   01/23/2023 1522 02/04/2023 1525  BP:  (!) 138/39  Pulse:  92  Resp:  16  Temp:  (!) 96.6 F (35.9 C)  SpO2: 100% 100%    General: Comatose, GCS 3 CV:  Thready peripheral pulses.  Regular rate, heart rate 60 Resp:  No spontaneous respirations, symmetric air movement with clear lungs bilaterally with bagging through the i-gel Abd:  No distention.  Soft Other:  Bilateral pupils are postsurgical.  No evidence of head trauma.  Left lower extremity has a large  laceration extending down to muscle fascia.  There is no bleeding through the wound.   ED Results / Procedures / Treatments   Labs (all labs ordered are listed, but only abnormal results are displayed) Labs Reviewed  CBC WITH DIFFERENTIAL/PLATELET - Abnormal; Notable for the following components:      Result Value   Hemoglobin 12.8 (*)    MCV 101.8 (*)    MCHC 28.4 (*)    Platelets 111 (*)    Lymphs Abs 5.1 (*)    Abs Immature Granulocytes 0.31 (*)    All other components within normal limits  COMPREHENSIVE METABOLIC PANEL - Abnormal; Notable for the following components:   CO2 20 (*)    Glucose, Bld 431 (*)    BUN 48 (*)    Creatinine, Ser 1.88 (*)    Calcium 7.9 (*)    Total Protein 5.7 (*)    Albumin 3.2 (*)    AST 114 (*)    ALT 96 (*)    GFR, Estimated 34 (*)    All  other components within normal limits  BLOOD GAS, ARTERIAL - Abnormal; Notable for the following components:   pH, Arterial <6.95 (*)    pCO2 arterial 84 (*)    pO2, Arterial 208 (*)    Bicarbonate 17.2 (*)    Acid-base deficit 16.7 (*)    All other components within normal limits  TROPONIN I (HIGH SENSITIVITY) - Abnormal; Notable for the following components:   Troponin I (High Sensitivity) 81 (*)    All other components within normal limits  URINALYSIS, COMPLETE (UACMP) WITH MICROSCOPIC  URINE DRUG SCREEN, QUALITATIVE ([facility] ONLY)  PROCALCITONIN  LACTIC ACID, PLASMA  LACTIC ACID, PLASMA  BETA-HYDROXYBUTYRIC ACID     EKG Interpreted by me Ventricular escape rhythm, rate of 44.  Right axis, left bundle branch block.  There is inferior STEMI.  Repeat EKG again demonstrates clear inferior STEMI.   RADIOLOGY    PROCEDURES:  .Critical Care  Performed by: Sharman Cheek, MD Authorized by: Sharman Cheek, MD   Critical care provider statement:    Critical care time (minutes):  45   Critical care time was exclusive of:  Separately billable procedures and treating other patients    Critical care was necessary to treat or prevent imminent or life-threatening deterioration of the following conditions:  Cardiac failure, circulatory failure, CNS failure or compromise, respiratory failure and shock   Critical care was time spent personally by me on the following activities:  Development of treatment plan with patient or surrogate, discussions with consultants, evaluation of patient's response to treatment, examination of patient, obtaining history from patient or surrogate, ordering and performing treatments and interventions, ordering and review of laboratory studies, ordering and review of radiographic studies, pulse oximetry, re-evaluation of patient's condition and review of old charts   Care discussed with: admitting provider   .Marland KitchenLaceration Repair  Date/Time: 01/24/2023 7:30 AM  Performed by: Sharman Cheek, MD Authorized by: Sharman Cheek, MD   Consent:    Consent obtained:  Emergent situation Universal protocol:    Patient identity confirmed:  Arm band Anesthesia:    Anesthesia method:  None Laceration details:    Location:  Leg   Leg location:  L lower leg   Length (cm):  8   Depth (mm):  11 Pre-procedure details:    Preparation:  Patient was prepped and draped in usual sterile fashion Exploration:    Wound exploration: wound explored through full range of motion and entire depth of wound visualized     Wound extent: fascia not violated, no foreign body, no signs of injury, no nerve damage, no tendon damage, no underlying fracture and no vascular damage     Contaminated: no   Treatment:    Area cleansed with:  Povidone-iodine and saline   Amount of cleaning:  Extensive   Irrigation solution:  Sterile saline   Irrigation method:  Pressure wash   Visualized foreign bodies/material removed: no     Debridement:  Moderate   Undermining:  Minimal   Layers/structures repaired:  Deep subcutaneous Deep subcutaneous:    Suture size:  4-0   Suture material:   Monocryl   Suture technique:  Running   Number of sutures:  8 Skin repair:    Repair method:  Sutures   Suture size:  3-0   Suture material:  Nylon   Suture technique:  Running   Number of sutures:  20 Approximation:    Approximation:  Close Repair type:    Repair type:  Complex Post-procedure details:  Dressing:  Open (no dressing)   Procedure completion:  Tolerated well, no immediate complications    MEDICATIONS ORDERED IN ED: Medications  DOPamine (INTROPIN) 800 mg in dextrose 5 % 250 mL (3.2 mg/mL) infusion (20 mcg/kg/min  88.3 kg Intravenous Rate/Dose Change 01/21/2023 1439)  EPINEPHrine (ADRENALIN) 5 mg in NS 250 mL (0.02 mg/mL) premix infusion (8 mcg/min Intravenous Rate/Dose Change 02/03/2023 1548)  EPINEPHrine (ADRENALIN) 1 MG/10ML injection (1 mg Intravenous Given 01/22/2023 1428)  atropine 1 MG/10ML injection (1 mg Intravenous Given 01/25/2023 1418)  sodium bicarbonate injection 50 mEq (has no administration in time range)  aspirin suppository 300 mg (has no administration in time range)  sodium chloride 0.9 % bolus 1,000 mL (1,000 mLs Intravenous New Bag/Given 02/09/2023 1445)     IMPRESSION / MDM / ASSESSMENT AND PLAN / ED COURSE  I reviewed the triage vital signs and the nursing notes.  DDx: STEMI, AKI, electrolyte abnormality, anemia  Patient's presentation is most consistent with acute presentation with potential threat to life or bodily function.  Patient presents with out of hospital cardiac arrest, post ROSC EKG demonstrating STEMI by EMS.  Code STEMI activated on arrival to the ED.   Clinical Course as of 01/31/2023 1549  Fri Feb 07, 2023  1439 Seen by cardiology Dr. Kary Kos who advises patient is not a candidate for PCI and has previously been evaluated by cardiology, was turned down for CABG due to underlying poor health.  Prior CODE STATUS was DNR, and due to futility of continuing to code, will continue to honor DNR status.  Patient currently on epinephrine drip  which is maintaining stable vitals.  Will need to admit to ICU. [PS]    Clinical Course User Index [PS] Sharman Cheek, MD    ----------------------------------------- 3:42 PM on 02/18/2023 ----------------------------------------- On mechanical ventilation and with epinephrine infusion, patient's hemodynamics seem to be stabilized.  No additional episodes of cardiac arrest.  Heart rate and blood pressure are remaining normal.  Peripheral perfusion is improved, as demonstrated by venous bleeding through his leg laceration.  He is also having return of brainstem function, overbreathing the ventilator periodically.  GCS is still 3.  Family's been updated.  Currently his granddaughter is here.  The patient's daughter who is his next of kin is on her way from [place], anticipated arrival to the ED around 430.  ----------------------------------------- 3:49 PM on 02/16/2023 ----------------------------------------- ICU team at bedside to evaluate patient   FINAL CLINICAL IMPRESSION(S) / ED DIAGNOSES   Final diagnoses:  Cardiac arrest     Rx / DC Orders   ED Discharge Orders     None        Note:  This document was prepared using Dragon voice recognition software and may include unintentional dictation errors.   Sharman Cheek, MD 01/21/2023 1556    Sharman Cheek, MD 02-23-2023 [phone]

## 2023-02-07 NOTE — Progress Notes (Signed)
At 1800 versed IV  once ordered by NP and second dose ordered by NP approximately 10 minutes later due to patient's non-purposeful movement during CVC placement.

## 2023-02-07 NOTE — ED Notes (Signed)
Daughter and granddaughter at bedside. Awaiting ICU MD.

## 2023-02-07 NOTE — Consult Note (Signed)
Cardiology Consultation:   Patient ID: Henry Lindsey; [id number]; [birth year]   Admit date: 2023/03/08 Date of Consult: 2023-03-08  Primary Care Provider: Gracelyn Nurse, MD Primary Cardiologist: Henry Lindsey Primary Electrophysiologist:  None   Patient Profile:   Henry Lindsey is a 89 y.o. 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 Karna Lindsey.  History of Present Illness:   Henry Lindsey underwent echo in 10/2020 showed an EF of 35% with normal RVSP, moderate mitral regurgitation, mild tricuspid regurgitation, and a mild pericardial effusion.  In this setting, he establish care with cardiology.  He underwent diagnostic LHC in 11/2020 which revealed severe diffuse LAD, diagonal, and OM disease with an occluded RCA.  Normal cardiac output/index.  LVEF is 35%.  He was evaluated by CT surgery and felt to be a poor surgical candidate and was initially medically managed.  He was subsequently admitted to the hospital in 07/2021 with an NSTEMI and volume overload.  Echo during that admission showed an EF of 25 to 30% with inferior wall best preserved.  Subsequent R/LHC demonstrated severe multivessel CAD including 80 to 90% ostial LAD stenosis CTO of the proximal RCA that was previously noted.  There was also noted he had an ectatic abdominal aorta, common, external iliac arteries, and common femoral arteries without significant disease.  He was transferred to [facility] where he underwent successful Impella supported high risk PCI to the ostial LAD with atherectomy and DES placement.   The stent extended 1 to 2 mm into the left main coronary artery to ensure coverage of the ostium.  Most recent echo from 10/2021 showed an EF of 30 to 35%, global hypokinesis, moderately dilated LV internal cavity size, moderately reduced RV systolic function with moderately large ventricular cavity size, moderately dilated left atrium, and an estimated right atrial pressure of 15 mmHg.  Escalation of GDMT has previously been limited by by financial constraints.   He presented to [facility] on the afternoon of 03-08-2023 after being found down by a Corporate investment banker in his backyard.  Construction workers reported they had seen the patient alert just 10 minutes prior.  EMS was called.  Bystander CPR was not performed.  Upon EMS arrival the patient was pulseless and apneic with an initial rhythm of PEA.  2 rounds of CPR were initiated, 2 A of epi, 2 defibrillations and he regained pulse.  The patient again went pulseless and was given CPR followed by 2 more rounds of epi with ROSC obtained approximately after 2 minutes while en-route to the ambulance.  He arrived to the ED with pulse.  In the ED, he again lost pulses CPR resumed followed by ROSC he was given 1 mg atropine, normal saline bolus, and placed on dopamine and epinephrine drips.  Uncertain original downtime that has this was not witnessed.  Upon arrival to the ED, patient's temp was 96.1 F with a BP of 130/60.  Currently on ventilator.  Initial high-sensitivity troponin 81.  ABG with pH of less than 6.95, pCO2 of 84.  Potassium 4.2, glucose 431, BUN 48,  serum creatinine 1.88 with a baseline around 0.8-1.0, albumin 3.2, AST 114, ALT 96.  Patient's family at bedside.  They tell us he has been very active throughout the day and was tilling his garden.  He had been in his usual state of health outside of being prescribed some prednisone for hip pain.  Family reports they would not want the patient to suffer or live in a potential vegetative state.  They would like  to gather further information, particularly regarding neurologic status prior to making further decisions regarding scope of care.       Past Medical History:  Diagnosis Date   Anginal pain (HCC)    Aortic atherosclerosis (HCC)    Arthritis    BPH (benign prostatic hyperplasia)    CAD (coronary artery disease)    a.) 2/22 Cath: LM 50d, LAD sev ost dz, mod-sev prox/mid dz. D1 sev dz. LCX mild prox dzs, OM2 mod-sev dz, RCA 100p. RPL and RPDA fill via L-R collats. EF 35%. Seen by CVTS->Med mgmt. b.) R/LHC 08/14/21: 30% m-d-LM, 85% oLAD, 50% pLAD, 35% dLAD, 80% D1, 50% pLCx, 70% OM1, 100% p-dRCA -> trans to [facility]. c.) 08/15/21 Impella supported HIGH RISK PCI with atherectomy. 2.75 x 26mm Onyx Frontier DES oLAD.   Carotid artery stenosis    a. 11/2010 U/S: <50% bilaterally; b. 03/2021 40-59% bilat ICA stenoses.   COPD (chronic obstructive pulmonary disease) (HCC)    noted CT 10/05/16 former smoker quit age 64    CVA (cerebral infarction) 11/2010   a.) RIGHT thalamic lacunar   GERD (gastroesophageal reflux disease)    Heart murmur    HFrEF (heart failure with reduced ejection fraction) (HCC)    a. 10/2020 Echo: EF 35%; b. 10/2021 Echo: EF 30-35%, mod reduced RV fxn, mod dil LA.   Hyperlipidemia    Hypertension    Ischemic cardiomyopathy    a. 10/2020 Echo: EF 35%. Nl RVSP. Mod MR. Mild TR; b. 11/2020 Cath:  CO/CI 5.05/2.49. LV gram: EF 35%; c. 10/2021 Echo: EF 30-35%, mod reduced RV fxn, mod dil LA.   Lower extremity cellulitis    NSTEMI (non-ST elevated myocardial infarction) (HCC) 08/09/2021   a.) R/LHC 08/14/2021: 30% m-d-LM, 85% oLAD, 50% pLAD, 35% dLAD, 80% D1, 50% pLCx, 70% OM1, 100% p-dRCA -> transfer to [facility]. b.) 08/15/2021 Impella supported HIGH RISK PCI with atherectomy. 2.75 x 26mm Onyx Frontier DES to [employer].   Peripheral vascular disease in diabetes mellitus (HCC)    a. 06/2012 95% occlusion s/p PTA R SFA (Dr. Wyn Lindsey); b. 03/2021 ABIs: R 1.02, L 0.96.   Pneumonia    Type 2 diabetes  mellitus treated with insulin [facility])     Past Surgical History:  Procedure Laterality Date   ABDOMINAL AORTOGRAM N/A 08/14/2021   Procedure: ABDOMINAL AORTOGRAM;  Surgeon: Yvonne Kendall, MD;  Location: [facility] INVASIVE CV LAB;  Service: Cardiovascular;  Laterality: N/A;   CHOLECYSTECTOMY N/A 11/13/2016   Procedure: LAPAROSCOPIC CHOLECYSTECTOMY WITH INTRAOPERATIVE CHOLANGIOGRAM;  Surgeon: Henrene Dodge, MD;  Location: [facility] ORS;  Service: General;  Laterality: N/A;   COLONOSCOPY WITH PROPOFOL N/A 02/10/2018   Procedure: COLONOSCOPY WITH PROPOFOL;  Surgeon: Midge Minium, MD;  Location: [facility] ENDOSCOPY;  Service: Endoscopy;  Laterality: N/A;   CORONARY ATHERECTOMY N/A 08/15/2021   Procedure: CORONARY ATHERECTOMY;  Surgeon: Iran Ouch, MD;  Location: [facility] INVASIVE CV LAB;  Service: Cardiovascular;  Laterality: N/A;   CORONARY STENT INTERVENTION W/IMPELLA N/A 08/15/2021   Procedure: Coronary Stent Intervention w/Impella;  Surgeon: Lorine Bears  A, MD;  Location: [facility] INVASIVE CV LAB;  Service: Cardiovascular;  Laterality: N/A;   ERCP N/A 11/17/2016   Procedure: ENDOSCOPIC RETROGRADE CHOLANGIOPANCREATOGRAPHY (ERCP);  Surgeon: Hilarie Fredrickson, MD;  Location: [facility] ENDOSCOPY;  Service: Endoscopy;  Laterality: N/A;   ERCP N/A 02/04/2017   Procedure: ENDOSCOPIC RETROGRADE CHOLANGIOPANCREATOGRAPHY (ERCP) Stent removal;  Surgeon: Midge Minium, MD;  Location: [facility] ENDOSCOPY;  Service: Endoscopy;  Laterality: N/A;   IR GENERIC HISTORICAL  10/06/2016   IR PERC CHOLECYSTOSTOMY 10/06/2016 Irish Lack, MD [facility]-INTER[facility] RAD   JOINT REPLACEMENT Left 2014   left knee   RIGHT/LEFT HEART CATH AND CORONARY ANGIOGRAPHY N/A 11/24/2020   Procedure: RIGHT/LEFT HEART CATH AND CORONARY ANGIOGRAPHY;  Surgeon: Antonieta Iba, MD;  Location: [facility] INVASIVE CV LAB;  Service: Cardiovascular;  Laterality: N/A;   RIGHT/LEFT HEART CATH AND CORONARY ANGIOGRAPHY N/A 08/14/2021   Procedure: RIGHT/LEFT HEART CATH AND CORONARY ANGIOGRAPHY;   Surgeon: Yvonne Kendall, MD;  Location: [facility] INVASIVE CV LAB;  Service: Cardiovascular;  Laterality: N/A;   TOTAL KNEE ARTHROPLASTY Right 08/08/2020   Procedure: TOTAL KNEE ARTHROPLASTY;  Surgeon: Lyndle Herrlich, MD;  Location: [facility] ORS;  Service: Orthopedics;  Laterality: Right;   TOTAL KNEE REVISION Right 10/01/2021   Procedure: IRRIGATION AND DEBRIDMENT RIGHT KNEE WITH POLY EXCHANGE;  Surgeon: Lyndle Herrlich, MD;  Location: [facility] ORS;  Service: Orthopedics;  Laterality: Right;   UPPER GASTROINTESTINAL ENDOSCOPY     WRIST SURGERY       Home Meds: Prior to Admission medications   Medication Sig Start Date End Date Taking? Authorizing Provider  ascorbic acid (VITAMIN C) 1000 MG tablet Take 1,000 mg by mouth daily.   Yes [provider]  aspirin EC 81 MG tablet Take 81 mg by mouth daily.   Yes [provider]  Blood Glucose Monitoring Suppl (ONE TOUCH ULTRA SYSTEM KIT) w/Device KIT 1 kit by Does not apply route once. Use DX code E11.59 10/18/15  Yes Sherlene Shams, MD  Cholecalciferol (VITAMIN D3) 50 MCG (2000 UT) TABS Take 2,000 Units by mouth daily.   Yes [provider]  clopidogrel (PLAVIX) 75 MG tablet TAKE 1 TABLET(75 MG) BY MOUTH DAILY WITH BREAKFAST 01/09/23  Yes Gollan, Tollie Pizza, MD  co-enzyme Q-10 30 MG capsule Take 100 mg by mouth daily.   Yes [provider]  dapagliflozin propanediol (FARXIGA) 10 MG TABS tablet Take 1 tablet (10 mg total) by mouth daily before breakfast. 12/20/22  Yes Gollan, Tollie Pizza, MD  diclofenac Sodium (VOLTAREN) 1 % GEL Apply 2 g topically 3 (three) times daily as needed (pain).   Yes [provider]  ezetimibe (ZETIA) 10 MG tablet Take 1 tablet (10 mg total) by mouth daily. 03/05/22  Yes Gollan, Tollie Pizza, MD  furosemide (LASIX) 40 MG tablet TAKE 1 TABLET(40 MG) BY MOUTH DAILY 01/09/23  Yes Gollan, Tollie Pizza, MD  gabapentin (NEURONTIN) 400 MG capsule Take 400 mg by mouth 3 (three) times daily.   Yes [provider]  glipiZIDE (GLUCOTROL) 10 MG tablet Take 10 mg by mouth daily before breakfast.   Yes [provider]  HYDROcodone-acetaminophen (NORCO/VICODIN) 5-325 MG tablet Take 1 tablet by mouth every 4 (four) hours as needed for moderate pain (pain score 4-6). Patient taking differently: Take 1 tablet by mouth every 6 (six) hours as needed for moderate pain. 10/04/21  Yes Altamese Cabal, PA-C  INS SYRINGE/NEEDLE 1CC/28G (B-D INSULIN SYRINGE 1CC/28G) 28G X 1/2" 1 ML MISC USE TO ADMINISTER INSULIN DAILY 02/13/16  Yes Sherlene Shams, MD  insulin degludec (TRESIBA FLEXTOUCH) 100 UNIT/ML FlexTouch Pen Inject 22-26 Units into the skin See admin instructions. Inject 20u under the skin daily - increase by 2u every three days that FBG is >150 in the morning   Yes [provider]  latanoprost (XALATAN) 0.005 % ophthalmic solution Place 1 drop into both eyes at bedtime. 11/01/15  Yes [provider]  losartan (COZAAR) 25 MG tablet Take 0.5 tablets (12.5 mg total) by mouth daily. Patient taking differently: Take 25 mg by mouth daily. 10/30/21  Yes Hackney, Inetta Fermo A, FNP  metolazone (ZAROXOLYN) 2.5 MG tablet Take 1 tablet (2.5 mg total) by mouth as needed (As needed for weight gain). Patient taking differently: Take 2.5 mg by mouth daily as needed (excess fluid retention). 01/31/22  Yes Creig Hines, NP  metoprolol succinate (TOPROL-XL) 25 MG 24 hr tablet TAKE 1 TABLET(25 MG) BY MOUTH DAILY 11/04/22  Yes Gollan, Tollie Pizza, MD  Multiple Vitamin (MULTIVITAMIN WITH MINERALS) TABS tablet Take 1 tablet by mouth in the morning.   Yes [provider]  nitroGLYCERIN (NITROSTAT) 0.4 MG SL tablet PLACE 1 TABLET UNDER THE TONGUE EVERY 5 MINUTES AS NEEDED FOR CHEST PAIN. MAX 3 TABLETS 04/17/22  Yes Gollan, Tollie Pizza, MD  ONE TOUCH ULTRA TEST test strip USE 1 STRIP TO TEST THREE TIMES DAILY AS DIRECTED 08/31/18  Yes Sherlene Shams, MD  [facility] DELICA LANCETS 33G MISC Use three  times daily to check blood sugar. 10/18/15  Yes Sherlene Shams, MD  pantoprazole (PROTONIX) 40 MG tablet Take 40 mg by mouth daily.   Yes [provider]  rosuvastatin (CRESTOR) 10 MG tablet Take 1 tablet (10 mg total) by mouth daily. 03/05/22  Yes Gollan, Tollie Pizza, MD  tamsulosin (FLOMAX) 0.4 MG CAPS capsule Take 0.4 mg by mouth daily after breakfast.   Yes [provider]  timolol (TIMOPTIC) 0.5 % ophthalmic solution Place 1 drop into both eyes in the morning. 12/19/17  Yes [provider]  traZODone (DESYREL) 50 MG tablet Take 50 mg by mouth at bedtime.   Yes [provider]  vitamin E 180 MG (400 UNITS) capsule Take 400 Units by mouth daily.   Yes [provider]  doxycycline (VIBRA-TABS) 100 MG tablet Take 1 tablet (100 mg total) by mouth 2 (two) times daily. 07/03/22   Lynn Ito, MD  insulin aspart protamine- aspart (NOVOLOG MIX 70/30) (70-30) 100 UNIT/ML injection Inject 0.15 mLs (15 Units total) into the skin 2 (two) times daily with a meal. Patient not taking: Reported on 02/16/2023 08/14/21   Enedina Finner, MD  rifampin (RIFADIN) 300 MG capsule Take 1 capsule (300 mg total) by mouth every 12 (twelve) hours. Patient not taking: Reported on 02/01/2023 11/21/21   Kathrynn Running, MD  spironolactone (ALDACTONE) 25 MG tablet Take 0.5 tablets (12.5 mg total) by mouth daily. Patient not taking: Reported on 01/26/2023 08/21/21   Manson Passey, PA    Inpatient Medications: Scheduled Meds:  acetaminophen  650 mg Oral Q4H   Or   acetaminophen (TYLENOL) oral liquid 160 mg/5 mL  650 mg Per Tube Q4H   Or   acetaminophen  650 mg Rectal Q4H   docusate  100 mg Per Tube BID   insulin aspart  0-15 Units Subcutaneous Q4H   pantoprazole (PROTONIX) IV  40 mg Intravenous QHS   polyethylene glycol  17 g Per Tube Daily   Continuous Infusions:  DOPamine 20 mcg/kg/min (02/03/2023 1439)  epinephrine 8 mcg/min (03-03-23 1548)   fentaNYL infusion  INTRAVENOUS     propofol (DIPRIVAN) infusion     PRN Meds: [START ON 02/09/2023] acetaminophen **OR** [START ON 02/09/2023] acetaminophen (TYLENOL) oral liquid 160 mg/5 mL **OR** [START ON 02/09/2023] acetaminophen, atropine, EPINEPHrine, fentaNYL, ondansetron (ZOFRAN) IV  Allergies:   Allergies  Allergen Reactions   Keflex [Cephalexin] Rash   Jardiance [Empagliflozin]     Rash all over and lips turn purple.    Metformin And Related Diarrhea   Trazodone Other (See Comments)    Extreme hallucinations/ psychosis    Torsemide Rash    "Skin reaction"    Social History:   Social History   Socioeconomic History   Marital status: Widowed    Spouse name: Talbert Forest, deceased   Number of children: 2   Years of education: 37   Highest education level: Not on file  Occupational History    Employer: retired  Tobacco Use   Smoking status: Former    Packs/day: 0.50    Years: 33.00    Additional pack years: 0.00    Total pack years: 16.50    Types: Cigarettes    Quit date: 04/21/1968    Years since quitting: 54.8   Smokeless tobacco: Never  Vaping Use   Vaping Use: Never used  Substance and Sexual Activity   Alcohol use: No   Drug use: No   Sexual activity: Not Currently    Partners: Female    Birth control/protection: None  Other Topics Concern   Not on file  Social History Narrative   Widowed in 2014; dating again , stays with girlfriend house   Social Determinants of Health   Financial Resource Strain: Not on file  Food Insecurity: Not on file  Transportation Needs: Not on file  Physical Activity: Not on file  Stress: Not on file  Social Connections: Not on file  Intimate Partner Violence: Not on file     Family History:   Family History  Problem Relation Age of Onset   Diabetes Mother     ROS:  Review of Systems  Unable to perform ROS: Intubated      Physical Exam/Data:   Vitals:   2023-03-03 1645 03/03/2023 1655 03/03/23 1700 03-03-23 1705  BP: (!) 140/55 (!)  148/47 (!) 140/60 (!) 135/54  Pulse: (!) 106 (!) 108 (!) 105 (!) 106  Resp: (!) 22 (!) 24 (!) 21 (!) 27  Temp: (!) 95.1 F (35.1 C) (!) 95.1 F (35.1 C) (!) 95.1 F (35.1 C) (!) 95.2 F (35.1 C)  SpO2: 100% 98% 100% 100%  Weight:       No intake or output data in the 24 hours ending 03-[name]-2023 1717 Filed Weights   03/03/2023 1421  Weight: 88.3 kg   Body mass index is 27.15 kg/m.   Physical Exam: General: Acutely ill-appearing, well developed, well nourished, in no acute distress. Head: Normocephalic, atraumatic, sclera non-icteric, no xanthomas, nares without discharge.  Neck: Negative for carotid bruits. JVD difficult to assess secondary to respiratory support apparatus. Lungs: Vented breath sounds bilaterally. Heart: RRR with S1 S2. No murmurs, rubs, or gallops appreciated. Abdomen: Soft, non-tender, non-distended with normoactive bowel sounds. No hepatomegaly. No rebound/guarding. No obvious abdominal masses. Msk:  Strength and tone appear normal for age. Extremities: No clubbing or cyanosis. No edema. Distal pedal pulses are 2+ and equal bilaterally. Neuro: Intubated. Psych: Intubated.   EKG:  The EKG was personally reviewed and demonstrates: Initial EKG [name] 03, 2023, 14:17) Junctional rhythm,  44 bpm, PVC, inferoposterior STEMI. Repeat EKG (4/19, 14:24) Junctional with bigeminy escape, 41 bpm, inferoposterior STEMI.  Telemetry:  Telemetry was personally reviewed and demonstrates: Sinus tachycardia with RBBB  Weights: Filed Weights   02/05/2023 1421  Weight: 88.3 kg    Relevant CV Studies:  As above  Laboratory Data:  Chemistry Recent Labs  Lab 01/27/2023 1445  NA 136  K 4.2  CL 101  CO2 20*  GLUCOSE 431*  BUN 48*  CREATININE 1.88*  CALCIUM 7.9*  GFRNONAA 34*  ANIONGAP 15    Recent Labs  Lab 01/21/2023 1445  PROT 5.7*  ALBUMIN 3.2*  AST 114*  ALT 96*  ALKPHOS 57  BILITOT 0.9   Hematology Recent Labs  Lab 02/18/2023 1445  WBC 9.7  RBC 4.43  HGB 12.8*  HCT  45.1  MCV 101.8*  MCH 28.9  MCHC 28.4*  RDW 15.0  PLT 111*   Cardiac EnzymesNo results for input(s): "TROPONINI" in the last 168 hours. No results for input(s): "TROPIPOC" in the last 168 hours.  BNPNo results for input(s): "BNP", "PROBNP" in the last 168 hours.  DDimer No results for input(s): "DDIMER" in the last 168 hours.  Radiology/Studies:  DG Chest Portable 1 View  Result Date: 01/30/2023 CLINICAL DATA:  Status post intubation EXAM: PORTABLE CHEST 1 VIEW COMPARISON:  X-ray 11/18/2021 FINDINGS: ET tube in place with tip proximally 4 cm above the carina. Enteric tube in place with tip extending beneath the diaphragm. Overlapping cardiac leads and defibrillator pads. Borderline cardiopericardial silhouette with some vascular congestion. No pneumothorax, effusion or edema. Apical pleural thickening. IMPRESSION: ET tube and enteric tube in place. Borderline cardiopericardial silhouette with some slight vascular congestion Electronically Signed   By: Karen Kays M.D.   On: 02/14/2023 15:58    Assessment and Plan:   Cardiopulmonary arrest with possible inferoposterior STEMI and cardiogenic shock/CAD:  -Unknown downtime (unwitnessed event, found in the backyard by a Corporate investment banker with multiple rounds of CPR and defibrillations undertaken)  -Interventional cardiology has evaluated the patient and discussed the case with family present, given uncertain downtime no emergent plans for LHC  -If patient has meaningful neurologic recovery would plan for cardiac cath prior to discharge  -Obtain echo  -Trend high-sensitivity troponin to peak -Stat head CT pending -If there is dynamic troponin elevation and if CT head shows no evidence of intracranial bleed, would recommend initiating heparin drip for 48 hours -Remains on epinephrine and dopamine, wean as able  -Resume PTA Plavix and Crestor when able -Family has elected to make patient DNR and will not pursue further chest compressions if he  arrests -They are very clear they would not want him to suffer or live in a potential vegetative state -They would like to gather further neurologic details prior to making a formal decision on continued scope of care  Combined systolic and diastolic CHF secondary to ICM:  -Diurese as indicated  -PTA GDMT on hold in the setting of presumed cardiogenic shock requiring vasopressor support, resume as able  -Echo pending    For questions or updates, please contact [facility] Please consult [email or web address] for contact info under Cardiology/STEMI.   Signed, Eula Listen, PA-C [facility] Pager: [phone] 01/21/2023, 5:17 PM

## 2023-02-07 NOTE — Consult Note (Signed)
PHARMACY CONSULT NOTE - FOLLOW UP  Pharmacy Consult for Electrolyte Monitoring and Replacement   Recent Labs: Potassium (mmol/L)  Date Value  02/14/2023 4.2  08/27/2012 4.6   Magnesium (mg/dL)  Date Value  16/07/9603 2.0   Calcium (mg/dL)  Date Value  54/06/8118 7.9 (L)   Calcium, Total (mg/dL)  Date Value  14/78/2956 7.9 (L)   Albumin (g/dL)  Date Value  21/30/8657 3.2 (L)  03/29/2012 3.5   Phosphorus (mg/dL)  Date Value  84/69/6295 3.1   Sodium (mmol/L)  Date Value  02/05/2023 136  11/17/2020 142  08/27/2012 136     Assessment: 89 yo male resented to ED due to cardiac arrest.  ROSC was obtained and patient was intubated and started on pressors.  Patient being admitted to ICU for further care.  Goal of Therapy:  WNL  Plan:  No indication for replacement at this time. Follow-up electrolytes with am labs   Barrie Folk ,PharmD Clinical Pharmacist 01/26/2023 5:14 PM

## 2023-02-07 NOTE — ED Notes (Signed)
Critical Results:  Initial Lactic: greater than 9 2nd Lactic 5.0  Elvina Sidle, NP made aware

## 2023-02-07 NOTE — Progress Notes (Signed)
RT Note:  Patient transported to CT and then ICU without event.  Remained on trilogy until transfer to [facility] in ICU.  RN accompanied.

## 2023-02-07 NOTE — Consult Note (Signed)
ANTICOAGULATION CONSULT NOTE - Initial Consult  Pharmacy Consult for Heparin Infusion Indication: chest pain/ACS  Allergies  Allergen Reactions   Keflex [Cephalexin] Rash   Jardiance [Empagliflozin]     Rash all over and lips turn purple.    Metformin And Related Diarrhea   Trazodone Other (See Comments)    Extreme hallucinations/ psychosis    Torsemide Rash    "Skin reaction"    Patient Measurements: Height:  (180.3 cm) Weight: 87.1 kg (192 lb 0.3 oz) IBW/kg (Calculated) : 75.3 kg Heparin Dosing Weight: 87.1 kg  Vital Signs: Temp: 95.5 F (35.3 C) (04/19 1749) Temp Source: Bladder (04/19 1749) BP: 146/55 (04/19 1749) Pulse Rate: 106 (04/19 1715)  Labs: Recent Labs    02/02/2023 1445 01/24/2023 1656  HGB 12.8*  --   HCT 45.1  --   PLT 111*  --   CREATININE 1.88*  --   TROPONINIHS 81* 613*    Estimated Creatinine Clearance: 29.5 mL/min (A) (by C-G formula based on SCr of 1.88 mg/dL (H)).   Medical History: Past Medical History:  Diagnosis Date   Anginal pain (HCC)    Aortic atherosclerosis (HCC)    Arthritis    BPH (benign prostatic hyperplasia)    CAD (coronary artery disease)    a.) 2/22 Cath: LM 50d, LAD sev ost dz, mod-sev prox/mid dz. D1 sev dz. LCX mild prox dzs, OM2 mod-sev dz, RCA 100p. RPL and RPDA fill via L-R collats. EF 35%. Seen by CVTS->Med mgmt. b.) R/LHC 08/14/21: 30% m-d-LM, 85% oLAD, 50% pLAD, 35% dLAD, 80% D1, 50% pLCx, 70% OM1, 100% p-dRCA -> trans to [facility]. c.) 08/15/21 Impella supported HIGH RISK PCI with atherectomy. 2.75 x 26mm Onyx Frontier DES oLAD.   Carotid artery stenosis    a. 11/2010 U/S: <50% bilaterally; b. 03/2021 40-59% bilat ICA stenoses.   COPD (chronic obstructive pulmonary disease) (HCC)    noted CT 10/05/16 former smoker quit age 35    CVA (cerebral infarction) 11/2010   a.) RIGHT thalamic lacunar   GERD (gastroesophageal reflux disease)    Heart murmur    HFrEF (heart failure with reduced ejection fraction) (HCC)     a. 10/2020 Echo: EF 35%; b. 10/2021 Echo: EF 30-35%, mod reduced RV fxn, mod dil LA.   Hyperlipidemia    Hypertension    Ischemic cardiomyopathy    a. 10/2020 Echo: EF 35%. Nl RVSP. Mod MR. Mild TR; b. 11/2020 Cath:  CO/CI 5.05/2.49. LV gram: EF 35%; c. 10/2021 Echo: EF 30-35%, mod reduced RV fxn, mod dil LA.   Lower extremity cellulitis    NSTEMI (non-ST elevated myocardial infarction) (HCC) 08/09/2021   a.) R/LHC 08/14/2021: 30% m-d-LM, 85% oLAD, 50% pLAD, 35% dLAD, 80% D1, 50% pLCx, 70% OM1, 100% p-dRCA -> transfer to [facility]. b.) 08/15/2021 Impella supported HIGH RISK PCI with atherectomy. 2.75 x 26mm Onyx Frontier DES to [employer].   Peripheral vascular disease in diabetes mellitus (HCC)    a. 06/2012 95% occlusion s/p PTA R SFA (Dr. Wyn Quaker); b. 03/2021 ABIs: R 1.02, L 0.96.   Pneumonia    Type 2 diabetes mellitus treated with insulin (HCC)     Medications:  Scheduled:   acetaminophen  650 mg Oral Q4H   Or   acetaminophen (TYLENOL) oral liquid 160 mg/5 mL  650 mg Per Tube Q4H   Or   acetaminophen  650 mg Rectal Q4H   [START ON 01/24/2023] aspirin EC  81 mg Oral Daily   [START ON 02/14/2023] Chlorhexidine  Gluconate Cloth  6 each Topical Q0600   clopidogrel  75 mg Per Tube Daily   docusate  100 mg Per Tube BID   insulin aspart  0-15 Units Subcutaneous Q4H   midazolam       mouth rinse  15 mL Mouth Rinse Q2H   pantoprazole (PROTONIX) IV  40 mg Intravenous QHS   polyethylene glycol  17 g Per Tube Daily   Infusions:   DOPamine 20 mcg/kg/min (02/16/2023 1439)   epinephrine 8 mcg/min (01/27/2023 1548)   fentaNYL infusion INTRAVENOUS     propofol (DIPRIVAN) infusion 10 mcg/kg/min (02/05/2023 1845)   PRN: Melene Muller ON 02/09/2023] acetaminophen **OR** [START ON 02/09/2023] acetaminophen (TYLENOL) oral liquid 160 mg/5 mL **OR** [START ON 02/09/2023] acetaminophen, fentaNYL, midazolam, ondansetron (ZOFRAN) IV, mouth rinse  Assessment: Henry Lindsey is a 89 y.o. male presenting after out-of-hospital cardiac arrest  (unknown downtime). PMH significant for HTN, T2DM, stroke, CAD, HFrEF, COPD, HLD, PAD. Patient was not on [facility] PTA per chart review. Patient found down and no bystander CPR was performed. Upon EMS arrival he was noted to be in PEA with return of ROSC after a few minutes of ACLS.  As they were moving the patient to the ambulance for transport he again lost his pulse with return of ROSC after 2 minutes of ACLS. Advanced airway was placed in the field as he remained unconscious following ROSC. He had third episode of cardiac arrest in the ED, again with approximately 2 minutes of ACLS with return of ROSC. Post ROSC EKG with EMS was concerning for STEMI and code STEMI was activated on arrival to the ED. Patient now admitted to ICU. Pharmacy has been consulted to initiate and manage heparin infusion.   Baseline Labs: Hgb 12.8, Hct 45.1, Plt 111. aPTT, PT, INR ordered.  Goal of Therapy:  Heparin level 0.3-0.7 units/ml Monitor platelets by anticoagulation protocol: Yes   Plan:  Give 4000 units bolus x 1 Start heparin infusion at 1050 units/hr Check HL in 8 hours  Continue to monitor H&H and platelets daily while on heparin infusion   Celene Squibb, PharmD PGY1 Pharmacy Resident 02/07/2023 7:11 PM

## 2023-02-07 NOTE — IPAL (Signed)
  Interdisciplinary Goals of Care Family Meeting   Date carried out: 01/26/2023  Location of the meeting: Bedside  Member's involved: Nurse Practitioner, Bedside Registered Nurse, and Family Member or next of kin  Durable Power of Attorney or acting medical decision maker: Pt's daughter Henry Lindsey, pts granddaughter, and pt's significant other  Discussion: We discussed goals of care for [employer] .  Discussed that pt is critically ill, with multiple organ failure, and concern for anoxic brain injury s/p 3 separate cardiac arrests.  Currently with NO COUGH/GAG/CORNEAL reflexes, + Dolls eyes, and pupils fixed.  He is overbreathing the vent with agonal respirations.    Pt's daughter states she would like to obtain CT head and to give her dad 48-72 hrs to obtain further Neuro workup with Neuro consultation, and to allow him to declare himself neurologically.  She would not be able to live with herself and would regret just withdrawing right now with knowing 100%.  She does report that the pt was previously a DNR, and pt would NOT BE OK LIVING ON MACHINES AND SUPPORTED ARTIFICIALLY IN A VEGETATIVE STATE with no quality of life.  Code status:   Code Status: DNR   Disposition: Continue current acute care  Time spent for the meeting: 20 minutes     Harlon Ditty, AGACNP-BC [facility] Prefer epic messenger for cross cover needs If after hours, please call E-link   Judithe Modest, NP  02/15/2023, 5:09 PM

## 2023-02-07 NOTE — ED Notes (Addendum)
Wound noted to left lower leg. Stiches being applied by Scotty Court, MD at this time. Waiting for family to arrive to make further decisions.

## 2023-02-07 NOTE — Procedures (Signed)
Central Venous Catheter Insertion Procedure Note  Henry Lindsey  [id number]  01-27-1933  Date:02/10/2023  Time:6:56 PM   Provider Performing:Alvetta Hidrogo D Elvina Sidle   Procedure: Insertion of Non-tunneled Central Venous [phone]) with US guidance ([id number])   Indication(s) Medication administration and Difficult access  Consent Risks of the procedure as well as the alternatives and risks of each were explained to the patient and/or caregiver.  Consent for the procedure was obtained and is signed in the bedside chart  Anesthesia Topical only with 1% lidocaine   Timeout Verified patient identification, verified procedure, site/side was marked, verified correct patient position, special equipment/implants available, medications/allergies/relevant history reviewed, required imaging and test results available.  Sterile Technique Maximal sterile technique including full sterile barrier drape, hand hygiene, sterile gown, sterile gloves, mask, hair covering, sterile ultrasound probe cover (if used).  Procedure Description Area of catheter insertion was cleaned with chlorhexidine and draped in sterile fashion.  With real-time ultrasound guidance a central venous catheter was placed into the right internal jugular vein. Nonpulsatile blood flow and easy flushing noted in all ports.  The catheter was sutured in place and sterile dressing applied.  Complications/Tolerance None; patient tolerated the procedure well. Chest X-ray is ordered to verify placement for internal jugular or subclavian cannulation.   Chest x-ray is not ordered for femoral cannulation.  EBL Minimal  Specimen(s) None   Line secured at the 17 cm mark. BIOPATCH applied to the insertion site.   Harlon Ditty, AGACNP-BC [facility] Prefer epic messenger for cross cover needs If after hours, please call E-link

## 2023-02-07 NOTE — H&P (Signed)
NAME:  Henry Lindsey, MRN:  [id number], DOB:  Feb 08, 1933, LOS: 0 ADMISSION DATE:  02/10/2023, CONSULTATION DATE:  01/29/2023 REFERRING MD:  Dr. Scotty Court, CHIEF COMPLAINT:  Cardiac Arrest   Brief Pt Description / Synopsis:  89 y.o. male admitted with out of hospital PEA cardiac arrest (3 separate arrests). Now with profound shock, multiorgan failure, and concern for anoxic brain injury.  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  Patient currently unresponsive and unable to contribute to history, therefore history is obtained from chart review.  Per ED and nursing notes, construction workers at the patient's home and found him unconscious on the ground.  Unclear of downtime prior to him being found unresponsive, reportedly he was seen 10 minutes prior.  No bystander CPR was performed.  Upon EMS arrival he was noted to be apneic and in PEA which they did 2 minutes of CPR, 2 cardioversions, and 2 A of epinephrine with return of ROSC.  As they were moving the patient to the ambulance for transport he again lost his pulse with return of ROSC after 2 minutes of ACLS.  Advanced airway was placed in the field as he remained unconscious following ROSC.  He had third episode of cardiac arrest in the ED, again with approximately 2 minutes of ACLS with return of ROSC.  Post ROSC EKG with EMS was concerning for STEMI and code STEMI was activated on arrival to the ED.  He was evaluated by Dr. Ignacia Felling cardiology who advised he  is not a candidate for PCI.  He has previously been evaluated by cardiology and was turned down for CABG due to overall poor health.  ED Course: Initial Vital Signs: Temperature 96.1 F, pulse 62, respiratory rate 17, blood pressure 130/60, SpO2 95% Significant Labs: Bicarb 20, glucose 431, BUN 48, creatinine 1.88, anion gap 15, albumin 3.2, AST 114, ALT 96, high-sensitivity troponin 81, WBC 9.7, procalcitonin less than 0.10, hemoglobin 12.8, platelets 111, beta hydroxybutyrate acid 0.4, lactic acid greater than 9 ABG postintubation: pH less than 6.5/pCO2 84/pO2 208/bicarb 17.2 Urinalysis negative for UTI, no ketones Imaging Chest X-ray>>FINDINGS: ET tube in place with tip proximally 4 cm above the carina. Enteric tube in place with tip extending beneath the diaphragm. Overlapping cardiac leads and defibrillator pads. Borderline cardiopericardial silhouette with some vascular congestion. No pneumothorax, effusion or edema. Apical pleural thickening. CT head is pending Medications Administered: 1 L normal saline bolus, required initiation of dopamine and epinephrine drips  PCCM is asked to admit the patient to ICU for further workup and treatment.  Please see "significant hospital events" section below for full detailed hospital course.  Pertinent  Medical History   Past Medical History:  Diagnosis Date   Anginal pain (HCC)    Aortic atherosclerosis (HCC)    Arthritis    BPH (benign prostatic hyperplasia)    CAD (coronary artery disease)    a.) 2/22 Cath: LM 50d, LAD sev ost dz, mod-sev prox/mid dz. D1 sev dz. LCX mild prox dzs, OM2 mod-sev dz, RCA 100p. RPL and  RPDA fill via L-R collats. EF 35%. Seen by CVTS->Med mgmt. b.) R/LHC 08/14/21: 30% m-d-LM, 85% oLAD, 50% pLAD, 35% dLAD, 80% D1, 50% pLCx, 70% OM1, 100% p-dRCA -> trans to [facility]. c.) 08/15/21 Impella supported HIGH RISK PCI with atherectomy. 2.75 x 26mm Onyx Frontier DES oLAD.   Carotid artery stenosis    a. 11/2010 U/S: <50%  bilaterally; b. 03/2021 40-59% bilat ICA stenoses.   COPD (chronic obstructive pulmonary disease) (HCC)    noted CT 10/05/16 former smoker quit age 34    CVA (cerebral infarction) 11/2010   a.) RIGHT thalamic lacunar   GERD (gastroesophageal reflux disease)    Heart murmur    HFrEF (heart failure with reduced ejection fraction) (HCC)    a. 10/2020 Echo: EF 35%; b. 10/2021 Echo: EF 30-35%, mod reduced RV fxn, mod dil LA.   Hyperlipidemia    Hypertension    Ischemic cardiomyopathy    a. 10/2020 Echo: EF 35%. Nl RVSP. Mod MR. Mild TR; b. 11/2020 Cath:  CO/CI 5.05/2.49. LV gram: EF 35%; c. 10/2021 Echo: EF 30-35%, mod reduced RV fxn, mod dil LA.   Lower extremity cellulitis    NSTEMI (non-ST elevated myocardial infarction) (HCC) 08/09/2021   a.) R/LHC 08/14/2021: 30% m-d-LM, 85% oLAD, 50% pLAD, 35% dLAD, 80% D1, 50% pLCx, 70% OM1, 100% p-dRCA -> transfer to [facility]. b.) 08/15/2021 Impella supported HIGH RISK PCI with atherectomy. 2.75 x 26mm Onyx Frontier DES to [employer].   Peripheral vascular disease in diabetes mellitus (HCC)    a. 06/2012 95% occlusion s/p PTA R SFA (Dr. Wyn Quaker); b. 03/2021 ABIs: R 1.02, L 0.96.   Pneumonia    Type 2 diabetes mellitus treated with insulin (HCC)      Micro Data:  N/A  Antimicrobials:   Anti-infectives (From admission, onward)    None       Significant Hospital Events: Including procedures, antibiotic start and stop dates in addition to other pertinent events   2023-02-13: Presented to ED following out of hospital cardiac arrest.  And profound shock requiring dopamine and epinephrine drips.  Concern for anoxic brain injury on exam.  CODE STATUS changed to DNR.  Family would like allowed time for neuro prognostication. 02-13-2023: After arrival to ICU exhibiting intermittent myoclonic jerking movements and rhythmic fasciculations of the jaw and face.  EEG to be performed this evening.  Interim History / Subjective:  -Patient seen in the ED, critically ill on the  ventilator -Patient unresponsive on no sedation, no cough/gag/corneal reflexes, positive doll's eyes, does overbreathing vent with agonal respirations currently -Currently requiring dopamine and epinephrine infusions to maintain blood pressure -Patient's daughter and family at bedside, CODE STATUS changed to DNR, but would like to allow time for neuro prognostication -After arrival to ICU exhibiting intermittent myoclonic jerking movements and rhythmic fasciculations of the jaw and face.  EEG to be performed this evening.  Objective   Blood pressure (!) 126/48, pulse (!) 116, temperature (!) 95.6 F (35.3 C), resp. rate (!) 26, weight 88.3 kg, SpO2 100 %.    Vent Mode: AC FiO2 (%):  [100 %] 100 % Set Rate:  [20 bmp-24 bmp] 24 bmp Vt Set:  [500 mL-600 mL] 600 mL PEEP:  [10 cmH20] 10 cmH20  No intake or output data in the 24 hours ending 02/13/2023 1618 Filed Weights   02-13-2023 1421  Weight: 88.3 kg    Examination: General: Critically ill-appearing male, laying in bed, on no sedation, no acute distress HENT: Atraumatic, normocephalic, neck supple,  no JVD, orally intubated Lungs: Coarse breath sounds bilaterally, even, overbreathing the ventilator Cardiovascular: Tachycardia, regular rhythm, S1-S2, no murmurs, rubs, gallops Abdomen: Soft, nontender, nondistended, no guarding or rebound tenderness, bowel sounds positive x 4 Extremities: Laceration which has been sutured in the ED to the left lower extremity, no edema Neuro: Unresponsive on no sedation, does not withdraw from painful stimuli, no cough/gag/corneal reflexes, positive doll's eyes, pupils fixed at 4 mm bilaterally GU: Foley catheter in place with scant amount of dark yellow urine  Resolved Hospital Problem list     Assessment & Plan:   #Out-of-hospital PEA Cardiac Arrest #Elevated Troponin, demand ischemia vs NSTEMI #Shock: suspect Cardiogenic #Combined Systolic & Diastolic CHF secondary to ICM PMHx: severe  CAD -Continuous cardiac monitoring -Maintain MAP >65 -Vasopressors as needed to maintain MAP goal ~ currently on Epi and Dopamine -Trend lactic acid until normalized -Trend HS Troponin until peaked -Echocardiogram pending -Diuresis as BP and renal function permits ~ unable to diurese currently due to shock -Cardiology consulted, appreciate input  #Acute Hypoxic & Hypercapnic Respiratory Failure in the setting of cardiac arrest -Full vent support, implement lung protective strategies -Plateau pressures less than 30 cm H20 -Wean FiO2 & PEEP as tolerated to maintain O2 sats >92% -Follow intermittent Chest X-ray & ABG as needed -Spontaneous Breathing Trials when respiratory parameters met and mental status permits -Implement VAP Bundle -Prn Bronchodilators -Diuresis as BP and renal function permit  #Acute Kidney Injury #AG Metabolic Acidosis due to Lactic acidosis and AKI -Monitor I&O's / urinary output -Follow BMP -Ensure adequate renal perfusion -Avoid nephrotoxic agents as able -Replace electrolytes as indicated -Trend lactic  #Mildly elevated LFT's, maybe developing shock liver -Trend LFT's and coags  #Anemia without s/sx of overt blood loss #Thrombocytopenia -Monitor for S/Sx of bleeding -Trend CBC -SCD's for VTE Prophylaxis  -Transfuse for Hgb <7  #Hyperglycemia in setting of DM Type II Beta-hydroxybutyric acid slightly elevated, but urine negative for ketones -CBG's q4h; Target range of 140 to 180 -SSI -Follow ICU Hypo/Hyperglycemia protocol -Check Hgb A1c  #Concern for Anoxic Brain Injury #Myoclonus #Sedation needs in setting of mechanical ventilation -Maintain a RASS goal of 0 to -1 -Propofol and Fentanyl as needed to maintain RASS goal -Avoid sedating medications as able -Daily wake up assessment -CT Head is pending -EEG is pending -Consult Neurology, appreciate input -Maintain Normothermia       Best Practice (right click and "Reselect all  SmartList Selections" daily)   Diet/type: NPO DVT prophylaxis: SCD GI prophylaxis: PPI Lines: N/A Foley:  Yes, and it is still needed Code Status:  DNR Last date of multidisciplinary goals of care discussion [N/A]  2023-02-14: Pt's daughter, granddaughter, and significant other updated at bedside.  See IPAL note for full discussion.  They give consent for placement of central and arterial lines.  Labs   CBC: Recent Labs  Lab 02-14-2023 1445  WBC 9.7  NEUTROABS 3.9  HGB 12.8*  HCT 45.1  MCV 101.8*  PLT 111*    Basic Metabolic Panel: Recent Labs  Lab Feb 14, 2023 1445  NA 136  K 4.2  CL 101  CO2 20*  GLUCOSE 431*  BUN 48*  CREATININE 1.88*  CALCIUM 7.9*   GFR: Estimated Creatinine Clearance: 29.5 mL/min (A) (by C-G formula based on SCr of 1.88 mg/dL (H)). Recent Labs  Lab 02/14/2023 1445  WBC 9.7    Liver Function Tests: Recent Labs  Lab 14-Feb-2023 1445  AST 114*  ALT 96*  ALKPHOS 57  BILITOT 0.9  PROT 5.7*  ALBUMIN 3.2*   No results for input(s): "LIPASE", "AMYLASE" in the last 168 hours. No results for input(s): "AMMONIA" in the last 168 hours.  ABG    Component Value Date/Time   PHART <6.95 (LL) 02/13/2023 1501   PCO2ART 84 (HH) 02/18/2023 1501   PO2ART 208 (H) 01/26/2023 1501   HCO3 17.2 (L) 01/24/2023 1501   ACIDBASEDEF 16.7 (H) 02/15/2023 1501   O2SAT 100 02/17/2023 1501     Coagulation Profile: No results for input(s): "INR", "PROTIME" in the last 168 hours.  Cardiac Enzymes: No results for input(s): "CKTOTAL", "CKMB", "CKMBINDEX", "TROPONINI" in the last 168 hours.  HbA1C: Hgb A1c MFr Bld  Date/Time Value Ref Range Status  08/09/2021 04:58 PM 8.1 (H) 4.8 - 5.6 % Final    Comment:    (NOTE)         Prediabetes: 5.7 - 6.4         Diabetes: >6.4         Glycemic control for adults with diabetes: <7.0   08/08/2020 02:43 PM 6.7 (H) 4.8 - 5.6 % Final    Comment:    (NOTE)         Prediabetes: 5.7 - 6.4         Diabetes: >6.4         Glycemic  control for adults with diabetes: <7.0     CBG: No results for input(s): "GLUCAP" in the last 168 hours.  Review of Systems:   Unable to assess due to AMS/unresponsiveness/intubation/critical illness   Past Medical History:  He,  has a past medical history of Anginal pain (HCC), Aortic atherosclerosis (HCC), Arthritis, BPH (benign prostatic hyperplasia), CAD (coronary artery disease), Carotid artery stenosis, COPD (chronic obstructive pulmonary disease) (HCC), CVA (cerebral infarction) (11/2010), GERD (gastroesophageal reflux disease), Heart murmur, HFrEF (heart failure with reduced ejection fraction) (HCC), Hyperlipidemia, Hypertension, Ischemic cardiomyopathy, Lower extremity cellulitis, NSTEMI (non-ST elevated myocardial infarction) (HCC) (08/09/2021), Peripheral vascular disease in diabetes mellitus (HCC), Pneumonia, and Type 2 diabetes mellitus treated with insulin (HCC).   Surgical History:   Past Surgical History:  Procedure Laterality Date   ABDOMINAL AORTOGRAM N/A 08/14/2021   Procedure: ABDOMINAL AORTOGRAM;  Surgeon: Yvonne Kendall, MD;  Location: [facility] INVASIVE CV LAB;  Service: Cardiovascular;  Laterality: N/A;   CHOLECYSTECTOMY N/A 11/13/2016   Procedure: LAPAROSCOPIC CHOLECYSTECTOMY WITH INTRAOPERATIVE CHOLANGIOGRAM;  Surgeon: Henrene Dodge, MD;  Location: [facility] ORS;  Service: General;  Laterality: N/A;   COLONOSCOPY WITH PROPOFOL N/A 02/10/2018   Procedure: COLONOSCOPY WITH PROPOFOL;  Surgeon: Midge Minium, MD;  Location: [facility] ENDOSCOPY;  Service: Endoscopy;  Laterality: N/A;   CORONARY ATHERECTOMY N/A 08/15/2021   Procedure: CORONARY ATHERECTOMY;  Surgeon: Iran Ouch, MD;  Location: [facility] INVASIVE CV LAB;  Service: Cardiovascular;  Laterality: N/A;   CORONARY STENT INTERVENTION W/IMPELLA N/A 08/15/2021   Procedure: Coronary Stent Intervention w/Impella;  Surgeon: Iran Ouch, MD;  Location: [facility] INVASIVE CV LAB;  Service: Cardiovascular;  Laterality: N/A;   ERCP N/A  11/17/2016   Procedure: ENDOSCOPIC RETROGRADE CHOLANGIOPANCREATOGRAPHY (ERCP);  Surgeon: Hilarie Fredrickson, MD;  Location[facility] ENDOSCOPY;  Service: Endoscopy;  Laterality: N/A;   ERCP N/A 02/04/2017   Procedure: ENDOSCOPIC RETROGRADE CHOLANGIOPANCREATOGRAPHY (ERCP) Stent removal;  Surgeon: Midge Minium, MD;  Location: [facility] ENDOSCOPY;  Service: Endoscopy;  Laterality: N/A;   IR GENERIC HISTORICAL  10/06/2016   IR PERC CHOLECYSTOSTOMY 10/06/2016 Irish Lack, MD [facility]-INTER[facility] RAD   JOINT REPLACEMENT Left 2014   left knee   RIGHT/LEFT HEART  CATH AND CORONARY ANGIOGRAPHY N/A 11/24/2020   Procedure: RIGHT/LEFT HEART CATH AND CORONARY ANGIOGRAPHY;  Surgeon: Antonieta Iba, MD;  Location: [facility] INVASIVE CV LAB;  Service: Cardiovascular;  Laterality: N/A;   RIGHT/LEFT HEART CATH AND CORONARY ANGIOGRAPHY N/A 08/14/2021   Procedure: RIGHT/LEFT HEART CATH AND CORONARY ANGIOGRAPHY;  Surgeon: Yvonne Kendall, MD;  Location: [facility] INVASIVE CV LAB;  Service: Cardiovascular;  Laterality: N/A;   TOTAL KNEE ARTHROPLASTY Right 08/08/2020   Procedure: TOTAL KNEE ARTHROPLASTY;  Surgeon: Lyndle Herrlich, MD;  Location: [facility] ORS;  Service: Orthopedics;  Laterality: Right;   TOTAL KNEE REVISION Right 10/01/2021   Procedure: IRRIGATION AND DEBRIDMENT RIGHT KNEE WITH POLY EXCHANGE;  Surgeon: Lyndle Herrlich, MD;  Location: [facility] ORS;  Service: Orthopedics;  Laterality: Right;   UPPER GASTROINTESTINAL ENDOSCOPY     WRIST SURGERY       Social History:   reports that he quit smoking about 54 years ago. His smoking use included cigarettes. He has a 16.50 pack-year smoking history. He has never used smokeless tobacco. He reports that he does not drink alcohol and does not use drugs.   Family History:  His family history includes Diabetes in his mother.   Allergies Allergies  Allergen Reactions   Keflex [Cephalexin] Rash   Jardiance [Empagliflozin]     Rash all over and lips turn purple.    Metformin And Related Diarrhea    Trazodone Other (See Comments)    Extreme hallucinations/ psychosis    Torsemide Rash    "Skin reaction"     Home Medications  Prior to Admission medications   Medication Sig Start Date End Date Taking? Authorizing Provider  ascorbic acid (VITAMIN C) 1000 MG tablet Take 1,000 mg by mouth daily.   Yes [provider]  aspirin EC 81 MG tablet Take 81 mg by mouth daily.   Yes [provider]  Blood Glucose Monitoring Suppl (ONE TOUCH ULTRA SYSTEM KIT) w/Device KIT 1 kit by Does not apply route once. Use DX code E11.59 10/18/15  Yes Sherlene Shams, MD  Cholecalciferol (VITAMIN D3) 50 MCG (2000 UT) TABS Take 2,000 Units by mouth daily.   Yes [provider]  clopidogrel (PLAVIX) 75 MG tablet TAKE 1 TABLET(75 MG) BY MOUTH DAILY WITH BREAKFAST 01/09/23  Yes Gollan, Tollie Pizza, MD  co-enzyme Q-10 30 MG capsule Take 100 mg by mouth daily.   Yes [provider]  dapagliflozin propanediol (FARXIGA) 10 MG TABS tablet Take 1 tablet (10 mg total) by mouth daily before breakfast. 12/20/22  Yes Gollan, Tollie Pizza, MD  diclofenac Sodium (VOLTAREN) 1 % GEL Apply 2 g topically 3 (three) times daily as needed (pain).   Yes [provider]  ezetimibe (ZETIA) 10 MG tablet Take 1 tablet (10 mg total) by mouth daily. 03/05/22  Yes Gollan, Tollie Pizza, MD  furosemide (LASIX) 40 MG tablet TAKE 1 TABLET(40 MG) BY MOUTH DAILY 01/09/23  Yes Gollan, Tollie Pizza, MD  gabapentin (NEURONTIN) 400 MG capsule Take 400 mg by mouth 3 (three) times daily.   Yes [provider]  glipiZIDE (GLUCOTROL) 10 MG tablet Take 10 mg by mouth daily before breakfast.   Yes [provider]  HYDROcodone-acetaminophen (NORCO/VICODIN) 5-325 MG tablet Take 1 tablet by mouth every 4 (four) hours as needed for moderate pain (pain score 4-6). Patient taking differently: Take 1 tablet by mouth every 6 (six) hours as needed for moderate pain. 10/04/21  Yes Altamese Cabal, PA-C  INS SYRINGE/NEEDLE  1CC/28G (B-D  INSULIN SYRINGE 1CC/28G) 28G X 1/2" 1 ML MISC USE TO ADMINISTER INSULIN DAILY 02/13/16  Yes Sherlene Shams, MD  insulin degludec (TRESIBA FLEXTOUCH) 100 UNIT/ML FlexTouch Pen Inject 20-26 Units into the skin See admin instructions. Inject 20u under the skin daily - increase by 2u every three days that FBG is >150 in the morning   Yes [provider]  latanoprost (XALATAN) 0.005 % ophthalmic solution Place 1 drop into both eyes at bedtime. 11/01/15  Yes [provider]  losartan (COZAAR) 25 MG tablet Take 0.5 tablets (12.5 mg total) by mouth daily. Patient taking differently: Take 25 mg by mouth daily. 10/30/21  Yes Hackney, Inetta Fermo A, FNP  metolazone (ZAROXOLYN) 2.5 MG tablet Take 1 tablet (2.5 mg total) by mouth as needed (As needed for weight gain). Patient taking differently: Take 2.5 mg by mouth daily as needed (excess fluid retention). 01/31/22  Yes Creig Hines, NP  metoprolol succinate (TOPROL-XL) 25 MG 24 hr tablet TAKE 1 TABLET(25 MG) BY MOUTH DAILY 11/04/22  Yes Gollan, Tollie Pizza, MD  Multiple Vitamin (MULTIVITAMIN WITH MINERALS) TABS tablet Take 1 tablet by mouth in the morning.   Yes [provider]  nitroGLYCERIN (NITROSTAT) 0.4 MG SL tablet PLACE 1 TABLET UNDER THE TONGUE EVERY 5 MINUTES AS NEEDED FOR CHEST PAIN. MAX 3 TABLETS 04/17/22  Yes Gollan, Tollie Pizza, MD  ONE TOUCH ULTRA TEST test strip USE 1 STRIP TO TEST THREE TIMES DAILY AS DIRECTED 08/31/18  Yes Sherlene Shams, MD  [facility] DELICA LANCETS 33G MISC Use three times daily to check blood sugar. 10/18/15  Yes Sherlene Shams, MD  pantoprazole (PROTONIX) 40 MG tablet Take 40 mg by mouth daily.   Yes [provider]  rosuvastatin (CRESTOR) 10 MG tablet Take 1 tablet (10 mg total) by mouth daily. 03/05/22  Yes Gollan, Tollie Pizza, MD  tamsulosin (FLOMAX) 0.4 MG CAPS capsule Take 0.4 mg by mouth daily after breakfast.   Yes [provider]  timolol (TIMOPTIC) 0.5 %  ophthalmic solution Place 1 drop into both eyes in the morning. 12/19/17  Yes [provider]  traZODone (DESYREL) 50 MG tablet Take 50 mg by mouth at bedtime.   Yes [provider]  vitamin E 180 MG (400 UNITS) capsule Take 400 Units by mouth daily.   Yes [provider]  doxycycline (VIBRA-TABS) 100 MG tablet Take 1 tablet (100 mg total) by mouth 2 (two) times daily. 07/03/22   Lynn Ito, MD  insulin aspart protamine- aspart (NOVOLOG MIX 70/30) (70-30) 100 UNIT/ML injection Inject 0.15 mLs (15 Units total) into the skin 2 (two) times daily with a meal. Patient not taking: Reported on 02/09/2023 08/14/21   Enedina Finner, MD  rifampin (RIFADIN) 300 MG capsule Take 1 capsule (300 mg total) by mouth every 12 (twelve) hours. Patient not taking: Reported on 01/24/2023 11/21/21   Kathrynn Running, MD  spironolactone (ALDACTONE) 25 MG tablet Take 0.5 tablets (12.5 mg total) by mouth daily. Patient not taking: Reported on 02/01/2023 08/21/21   Manson Passey, PA     Critical care time: 50 minutes     Harlon Ditty, AGACNP-BC [facility] Prefer epic messenger for cross cover needs If after hours, please call E-link

## 2023-02-07 NOTE — ED Provider Notes (Signed)
Procedure Name: Intubation Date/Time: 02/18/2023 3:25 PM  Performed by: Shaune Pollack, MDPre-anesthesia Checklist: Patient identified, Patient being monitored, Emergency Drugs available, Timeout performed and Suction available Oxygen Delivery Method: Non-rebreather mask Preoxygenation: Pre-oxygenation with 100% oxygen Induction Type: Rapid sequence Ventilation: Mask ventilation without difficulty Laryngoscope Size: Glidescope and 4 Grade View: Grade I Tube size: 7.5 mm Number of attempts: 1 Airway Equipment and Method: Rigid stylet Placement Confirmation: ETT inserted through vocal cords under direct vision, CO2 detector and Breath sounds checked- equal and bilateral Secured at: 25 cm Tube secured with: ETT holder Dental Injury: Teeth and Oropharynx as per pre-operative assessment  Difficulty Due To: Difficulty was unanticipated Future Recommendations: Recommend- induction with short-acting agent, and alternative techniques readily available Comments: Small amount of blood noted in oropharynx likely from iGel placement. Igel removed and ETT inserted using Glidescope, with sats 100% throughout procedure. Tolerated well. Pt noted to have spontaneous respirations but no gag reflex, no significant muscle tone.    .Critical Care  Performed by: Shaune Pollack, MD Authorized by: Shaune Pollack, MD   Critical care provider statement:    Critical care time (minutes):  30   Critical care time was exclusive of:  Separately billable procedures and treating other patients   Critical care was necessary to treat or prevent imminent or life-threatening deterioration of the following conditions:  Cardiac failure, circulatory failure and respiratory failure   Critical care was time spent personally by me on the following activities:  Development of treatment plan with patient or surrogate, discussions with consultants, evaluation of patient's response to treatment, examination of patient, ordering  and review of laboratory studies, ordering and review of radiographic studies, ordering and performing treatments and interventions, pulse oximetry, re-evaluation of patient's condition and review of old charts   ETT exchanged by me, tolerated well. CXR shows appropriate placement, mild CHF. Pt repeat EKG obtained, reviewed, shows no ongoing STEMI. Discussed with Cardiology who does not recommend tPA at this time. ASA given. Will order CT head, admit to ICU. Dr. Karna Christmas with ICU consulted and discussed at bedside. Family is aware.     Shaune Pollack, MD 02/17/2023 [phone]

## 2023-02-07 NOTE — ED Triage Notes (Signed)
Pt arrives via [facility] with reports of found down by a Corporate investment banker in his back yard. Upon EMS arrival pt was pulseless and apneic. En route pt was given 2 epi and 2 defibs, regained pulses for approx 10 minutes. Pt went pulseless again and was given 2 epi and CPR. Pt arrived with pulse.

## 2023-02-07 NOTE — ED Notes (Signed)
Attempted to give report- RN states they need 10 minutes and will call back.

## 2023-02-07 NOTE — ED Notes (Signed)
Critical Results:  pH 6.92 pcO2 84 Bicarb 17  [place], MD aware

## 2023-02-07 NOTE — Progress Notes (Signed)
Eeg done 

## 2023-02-07 NOTE — Progress Notes (Signed)
Chaplain On-Call responded to Code STEMI notification at 1415 hours.  Chaplain received medical update from Dr. Marga Hoots, who reported the patient is continuing to require intermittent CPR, and also that Dr. Kirke Corin was speaking with family in the [facility] Consult room.  Chaplain received update there from Dr. Kirke Corin, and met three family members: Patient's partner Patient's granddaughter and her husband  Chaplain provided spiritual and emotional support as the family received information from Dr. Scotty Court concerning eventual decision-making regarding the duration of life-support measures.  Chaplain assured family of support as needed.  Chaplain Evelena Peat M.Div., [facility]

## 2023-02-08 ENCOUNTER — Inpatient Hospital Stay: Payer: Medicare Other

## 2023-02-08 ENCOUNTER — Inpatient Hospital Stay (HOSPITAL_COMMUNITY)
Admission: EM | Admit: 2023-02-08 | Discharge: 2023-02-08 | Disposition: A | Discharge status: Home / Self Care | Payer: Medicare Other | Source: Home / Self Care | Attending: Cardiovascular Disease | Admitting: Cardiovascular Disease

## 2023-02-08 DIAGNOSIS — I469 Cardiac arrest, cause unspecified: Secondary | ICD-10-CM | POA: Diagnosis not present

## 2023-02-08 DIAGNOSIS — Z7189 Other specified counseling: Secondary | ICD-10-CM | POA: Diagnosis not present

## 2023-02-08 DIAGNOSIS — Z515 Encounter for palliative care: Secondary | ICD-10-CM

## 2023-02-08 LAB — CBC
HCT: 39 % (ref 39.0–52.0)
Hemoglobin: 12.5 g/dL — ABNORMAL LOW (ref 13.0–17.0)
MCH: 29.2 pg (ref 26.0–34.0)
MCHC: 32.1 g/dL (ref 30.0–36.0)
MCV: 91.1 fL (ref 80.0–100.0)
Platelets: 180 10*3/uL (ref 150–400)
RBC: 4.28 MIL/uL (ref 4.22–5.81)
RDW: 14.8 % (ref 11.5–15.5)
WBC: 8.9 10*3/uL (ref 4.0–10.5)
nRBC: 0.3 % — ABNORMAL HIGH (ref 0.0–0.2)

## 2023-02-08 LAB — HEPARIN LEVEL (UNFRACTIONATED): Heparin Unfractionated: 0.44 IU/mL (ref 0.30–0.70)

## 2023-02-08 LAB — HEPATIC FUNCTION PANEL
ALT: 190 U/L — ABNORMAL HIGH (ref 0–44)
AST: 297 U/L — ABNORMAL HIGH (ref 15–41)
Albumin: 2.7 g/dL — ABNORMAL LOW (ref 3.5–5.0)
Alkaline Phosphatase: 54 U/L (ref 38–126)
Bilirubin, Direct: 0.1 mg/dL (ref 0.0–0.2)
Indirect Bilirubin: 0.6 mg/dL (ref 0.3–0.9)
Total Bilirubin: 0.7 mg/dL (ref 0.3–1.2)
Total Protein: 5.2 g/dL — ABNORMAL LOW (ref 6.5–8.1)

## 2023-02-08 LAB — RENAL FUNCTION PANEL
Albumin: 2.7 g/dL — ABNORMAL LOW (ref 3.5–5.0)
Anion gap: 13 (ref 5–15)
BUN: 56 mg/dL — ABNORMAL HIGH (ref 8–23)
CO2: 20 mmol/L — ABNORMAL LOW (ref 22–32)
Calcium: 7.6 mg/dL — ABNORMAL LOW (ref 8.9–10.3)
Chloride: 105 mmol/L (ref 98–111)
Creatinine, Ser: 2.13 mg/dL — ABNORMAL HIGH (ref 0.61–1.24)
GFR, Estimated: 29 mL/min — ABNORMAL LOW (ref >60)
Glucose, Bld: 340 mg/dL — ABNORMAL HIGH (ref 70–99)
Phosphorus: 4.8 mg/dL — ABNORMAL HIGH (ref 2.5–4.6)
Potassium: 3.6 mmol/L (ref 3.5–5.1)
Sodium: 138 mmol/L (ref 135–145)

## 2023-02-08 LAB — LACTIC ACID, PLASMA
Lactic Acid, Venous: 3.8 mmol/L (ref 0.5–1.9)
Lactic Acid, Venous: 3.3 mmol/L (ref 0.5–1.9)

## 2023-02-08 LAB — ECHOCARDIOGRAM COMPLETE
AV Peak grad: 8 mmHg
Ao pk vel: 1.41 m/s
Area-P 1/2: 3.61 cm2
Calc EF: 34.1 %
Height: 71 in
Single Plane A2C EF: 27.9 %
Single Plane A4C EF: 40.2 %
Weight: 3082.91 oz

## 2023-02-08 LAB — GLUCOSE, CAPILLARY
Glucose-Capillary: 204 mg/dL — ABNORMAL HIGH (ref 70–99)
Glucose-Capillary: 289 mg/dL — ABNORMAL HIGH (ref 70–99)
Glucose-Capillary: 334 mg/dL — ABNORMAL HIGH (ref 70–99)

## 2023-02-08 LAB — TRIGLYCERIDES: Triglycerides: 78 mg/dL (ref <150)

## 2023-02-08 LAB — MAGNESIUM: Magnesium: 2.4 mg/dL (ref 1.7–2.4)

## 2023-02-08 MED ORDER — PERFLUTREN LIPID MICROSPHERE
1.0000 mL | INTRAVENOUS | Status: AC | PRN
  Administered 2023-02-08: 3.5 mL via INTRAVENOUS

## 2023-02-08 MED ORDER — LORAZEPAM 2 MG/ML IJ SOLN
0.5000 mg/h | INTRAVENOUS | Status: DC
  Administered 2023-02-08: 0.5 mg/h via INTRAVENOUS
  Filled 2023-02-08: qty 25

## 2023-02-08 MED ORDER — MORPHINE 100MG IN NS 100ML (1MG/ML) PREMIX INFUSION
1.0000 mg/h | INTRAVENOUS | Status: DC
  Administered 2023-02-08: 2 mg/h via INTRAVENOUS
  Filled 2023-02-08: qty 100

## 2023-02-08 MED ORDER — POLYVINYL ALCOHOL 1.4 % OP SOLN: 1.0000 [drp] | Freq: Four times a day (QID) | OPHTHALMIC | Status: DC | PRN

## 2023-02-08 MED ORDER — GLYCOPYRROLATE 0.2 MG/ML IJ SOLN: 0.2000 mg | INTRAMUSCULAR | Status: DC | PRN

## 2023-02-08 MED ORDER — GLYCOPYRROLATE 1 MG PO TABS: 1.0000 mg | ORAL_TABLET | ORAL | Status: DC | PRN

## 2023-02-08 MED ORDER — POTASSIUM CHLORIDE 20 MEQ PO PACK
40.0000 meq | PACK | Freq: Once | ORAL | Status: AC
  Administered 2023-02-08: 40 meq
  Filled 2023-02-08: qty 2

## 2023-02-08 MED ORDER — BIOTENE DRY MOUTH MT LIQD: 15.0000 mL | OROMUCOSAL | Status: DC | PRN

## 2023-02-08 MED ORDER — MORPHINE BOLUS VIA INFUSION: 2.0000 mg | INTRAVENOUS | Status: DC | PRN

## 2023-02-08 MED ORDER — LACTATED RINGERS IV BOLUS
500.0000 mL | Freq: Once | INTRAVENOUS | Status: AC
  Administered 2023-02-08: 500 mL via INTRAVENOUS

## 2023-02-12 LAB — BLOOD GAS, ARTERIAL
Bicarbonate: 18.2 mmol/L — ABNORMAL LOW (ref 20.0–28.0)
Mechanical Rate: 24
O2 Saturation: 100 %
PEEP: 10 cmH2O
Patient temperature: 37
pCO2 arterial: 30 mmHg — ABNORMAL LOW (ref 32–48)
pH, Arterial: 7.39 (ref 7.35–7.45)

## 2023-02-19 NOTE — Consult Note (Signed)
Consultation Note Date: 02/07/2023   Patient Name: Henry Lindsey  DOB: [birth year]  MRN: [id number]  Age / Sex: 89 y.o., male  PCP: Henry Nurse, MD Referring Physician: Vida Rigger, MD  Reason for Consultation: Terminal Care   HPI/Brief Hospital Course: 89 y.o. 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 Noted PEA arrest (3 separate arrests), found by construction workers outside on the ground-unclear down time (reportedly seen 10 minutes prior). No bystander CPR was performed. EMS performed 2 minutes of CPR, 2 cardioversions, 2 rounds of epinephrine with return of ROSC. Lost pulse again in transport, 2 minutes of ACLS prior to ROSC. Third cardiac arrest in field approximately 2 minutes ACLS prior to ROSC. Code STEMI called in route-evaluated by cardiology, not a candidate for PCI. Previously evaluated by cardiology and found to not be a candidate for CABG due to overall poor health.  Developed myoclonus 4/19  Repeat head CT 4/20 AM IMPRESSION: 1. Significant progression of diffuse cortical and basal ganglia infarcts as described. Infarcts and hypoattenuation extensively within the ACA and PCA distributions. 2. Mass effect on the ventricles bilaterally without hydrocephalus. 3. No acute hemorrhage.  Palliative medicine was consulted for assisting with symptom management and assisting with providing comfort care.  Subjective:  Extensive chart review has been completed prior to meeting patient including labs, vital signs, imaging, progress notes, orders, and available advanced directive documents from current and previous encounters.  CCM team leading the way in  caring for this patient up until this point. Per review of chart, family led decision to proceed with compassionate extubation to comfort care this AM. Compassionate extubation 12:15PM today.  Called to bedside by nursing staff after speaking with CCM team. CCM team provided brief HPI and informed of compassionate extubation with transition to comfort measures. Nursing staff concerned of Henry Lindsey discomfort and abnormal breathing patterns. Requested to assist with symptom management and assisting with providing comfort measures by CCM team.  On arrival to room, Henry Lindsey with eyes closed, unresponsive, appears comfortable with no obvious sings of distress. Breathing non-labored but with audible snoring with brief periods of apnea. Many family members at bedside-grieving appropriately. Family asked appropriate questions regarding comfort care and transitions to end of life. All questions and concerns addressed. Empathetic silence and emotional support provided.  On arrival to room, Morphine continuous infusion at 10 mg/hr and Lorazepam continuous infusion at /hr.  Orders placed to reflect comfort measures. Continue with current infusions that were ordered prior to compassionate extubation. Addition of Morphine bolus via infusion  q31mins PRN to maintain comfort.  Nursing staff made aware of new orders. Spoke on patient appearing comfortable and breathing patterns expected at end of life.   PMT to continue to be available for ongoing needs and support.  Anticipate hospital death 24-48 hours.  Objective: Primary Diagnoses: Present on Admission:  Cardiac arrest   Discussed With: Nursing staff and CCM team.   Thank you for this consult and allowing Palliative Medicine to participate in the care of Henry Lindsey. Palliative medicine will continue to follow and assist as needed.   Time Total: 40 minutes  Time spent includes: Detailed review of medical records (labs, imaging, vital  signs), medically appropriate exam (mental status, respiratory, cardiac, skin),  discussed with treatment team, counseling and educating patient, family and staff, documenting clinical information, medication management and coordination of care.   Signed by: Henry Deed, DNP, AGNP-C Palliative Medicine    Please contact Palliative Medicine Team phone at [phone] for questions and concerns.  For individual provider: See Loretha Stapler

## 2023-02-19 NOTE — Progress Notes (Signed)
Med Waste and Verified by Cheral Bay RN  Morphine: 45  Lorazepam: 8  Versed: 20

## 2023-02-19 NOTE — Consult Note (Signed)
PHARMACY CONSULT NOTE  Pharmacy Consult for Electrolyte Monitoring and Replacement   Recent Labs: Potassium (mmol/L)  Date Value  02/15/2023 3.6  08/27/2012 4.6   Magnesium (mg/dL)  Date Value  16/07/9603 2.4   Calcium (mg/dL)  Date Value  54/06/8118 7.6 (L)   Calcium, Total (mg/dL)  Date Value  14/78/2956 7.9 (L)   Albumin (g/dL)  Date Value  21/30/8657 2.7 (L)  01/29/2023 2.7 (L)  03/29/2012 3.5   Phosphorus (mg/dL)  Date Value  84/69/6295 4.8 (H)   Sodium (mmol/L)  Date Value  02/02/2023 138  11/17/2020 142  08/27/2012 136     Assessment: 89 yo male resented to ED due to cardiac arrest.  ROSC was obtained and patient was intubated and started on pressors.  Patient being admitted to ICU for further care.  Goal of Therapy:  Potassium 4.0 - 5.1 mmol/L Magnesium 2.0 - 2.4 mg/dL All Other Electrolytes WNL  Plan:  20 mEq Kcl per tube x 1 Follow-up electrolytes with am labs   Lowella Bandy ,PharmD Clinical Pharmacist 01/29/2023 7:20 AM

## 2023-02-19 NOTE — Progress Notes (Signed)
Had a family discussion with the daughter and significant other.  He has had a devastating brain injury as evidenced both clinically and radiographically.  He does not have any chance of recovery to a significant level of function.  I discussed this with the daughter and significant other, the daughter notes that if he would not be expected to wake up he would not want to have ongoing critical care support.  In the setting advised that neck step would be to change him to comfort care, with withdrawal of ventilator support and focus on treating any signs of discomfort.  His daughter expressed agreement with this plan of action.  Neurology will continue to be available on an as-needed basis, please call further questions or concerns.  Ritta Slot, MD [facility] [phone]  If 7pm- 7am, please page neurology on call as listed in AMION.

## 2023-02-19 NOTE — Death Summary Note (Signed)
   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  Patient currently unresponsive and unable to contribute to history, therefore history is obtained from chart review.   Per ED and nursing notes, construction workers at the patient's home and found him unconscious on the ground.  Unclear of downtime prior to him being found unresponsive, reportedly he was seen 10 minutes prior.  No bystander CPR was performed.  Upon EMS arrival he was noted to be apneic and in PEA which they did 2 minutes of CPR, 2 cardioversions, and 2 A of epinephrine with return of ROSC.  As they were moving the patient to the ambulance for transport he again lost his pulse with return of ROSC after 2 minutes of ACLS.  Advanced airway was placed in the field as he remained unconscious following ROSC.   He had third episode of cardiac arrest in the ED, again with approximately 2 minutes of ACLS with return of ROSC.  Post ROSC EKG with EMS was concerning for STEMI and code STEMI was activated on arrival to the ED.  He was evaluated by Dr. Ignacia Felling cardiology who advised he is not a candidate for PCI.  He has previously been evaluated by cardiology and was turned down for CABG due to overall poor health.  Hospital  Course:  Patient with findings post cardiac arrest consistent with devastating brain injury as evidenced both clinically and radiographically as per neurologist Dr Amada Jupiter.  Family at bedside and agreeable to comfort measures. May he rest in peace.    Signed:    Vida Rigger, M.D.  Pulmonary & Critical Care Medicine  [facility]

## 2023-02-19 NOTE — Procedures (Signed)
History: 89 year old male status post cardiac arrest  Sedation: Propofol  Technique: This EEG was acquired with electrodes placed according to the International 10-20 electrode system (including Fp1, Fp2, F3, F4, C3, C4, P3, P4, O1, O2, T3, T4, T5, T6, A1, A2, Fz, Cz, Pz). The following electrodes were missing or displaced: none.   Background: The background is diffusely attenuated with the exception of brief less than 1 second runs of theta activity with superimposed muscle activity when the patient has brief generalized myoclonus.  Photic stimulation: Physiologic driving is not performed  EEG Abnormalities: 1) post anoxic myoclonus 2) burst suppression EEG  Clinical Interpretation: This EEG is consistent with a profound diffuse cerebral dysfunction as can be seen after severe hypoxic brain injury.  In the setting of anoxic brain injury, this EEG is highly concerning for a poor prognosis.  Ritta Slot, MD [facility] [phone]  If 7pm- 7am, please page neurology on call as listed in AMION.

## 2023-02-19 NOTE — Progress Notes (Signed)
Pt extubated at 1205... family at bedside.

## 2023-02-19 NOTE — Progress Notes (Addendum)
Stopped Propofol and Versed per Dr. Amada Jupiter  Paged Chaplin per wife's request who is at bedside.

## 2023-02-19 NOTE — Progress Notes (Signed)
Pt's time of death: 01/24/2023 @ 1429   Auscultated heart and lung sound x1 min along w/Katie Ebony Cargo RN ... No lungs or heart sounds auscultated.    No visible chest rise seen   Notified MD and [employer]  Printed asystole strip and placed in chart

## 2023-02-19 NOTE — Consult Note (Signed)
ANTICOAGULATION CONSULT NOTE  Pharmacy Consult for Heparin Infusion Indication: chest pain/ACS  Allergies  Allergen Reactions   Keflex [Cephalexin] Rash   Jardiance [Empagliflozin]     Rash all over and lips turn purple.    Metformin And Related Diarrhea   Trazodone Other (See Comments)    Extreme hallucinations/ psychosis    Torsemide Rash    "Skin reaction"    Patient Measurements: Height:  (180.3 cm) Weight: 87.4 kg (192 lb 10.9 oz) IBW/kg (Calculated) : 75.3 kg Heparin Dosing Weight: 87.1 kg  Vital Signs: Temp: 97.7 F (36.5 C) (04/20 0515) Temp Source: Bladder (04/20 0300) BP: 98/80 (04/20 0000) Pulse Rate: 35 (04/20 0515)  Labs: Recent Labs    01/27/2023 1445 01/31/2023 1542 02/09/2023 1656 02/09/2023 1859 02/10/2023 2000 01/31/2023 2300 01/20/2023 0246 02/03/2023 0400  HGB 12.8* 13.9  --   --   --   --   --  12.5*  HCT 45.1 45.8  --   --   --   --   --  39.0  PLT 111* 185  --   --   --   --   --  180  APTT  --   --   --  35  --   --   --   --   LABPROT  --   --   --  17.4*  --   --   --   --   INR  --   --   --  1.4*  --   --   --   --   HEPARINUNFRC  --   --   --   --   --   --  0.44  --   CREATININE 1.88*  --   --   --  2.22*  --  2.13*  --   TROPONINIHS 81*  --  613*  --  4,565* >24,000*  --   --      Estimated Creatinine Clearance: 26 mL/min (A) (by C-G formula based on SCr of 2.13 mg/dL (H)).   Medical History: Past Medical History:  Diagnosis Date   Anginal pain (HCC)    Aortic atherosclerosis (HCC)    Arthritis    BPH (benign prostatic hyperplasia)    CAD (coronary artery disease)    a.) 2/22 Cath: LM 50d, LAD sev ost dz, mod-sev prox/mid dz. D1 sev dz. LCX mild prox dzs, OM2 mod-sev dz, RCA 100p. RPL and RPDA fill via L-R collats. EF 35%. Seen by CVTS->Med mgmt. b.) R/LHC 08/14/21: 30% m-d-LM, 85% oLAD, 50% pLAD, 35% dLAD, 80% D1, 50% pLCx, 70% OM1, 100% p-dRCA -> trans to [facility]. c.) 08/15/21 Impella supported HIGH RISK PCI with atherectomy. 2.75 x  26mm Onyx Frontier DES oLAD.   Carotid artery stenosis    a. 11/2010 U/S: <50% bilaterally; b. 03/2021 40-59% bilat ICA stenoses.   COPD (chronic obstructive pulmonary disease) (HCC)    noted CT 10/05/16 former smoker quit age 48    CVA (cerebral infarction) 11/2010   a.) RIGHT thalamic lacunar   GERD (gastroesophageal reflux disease)    Heart murmur    HFrEF (heart failure with reduced ejection fraction) (HCC)    a. 10/2020 Echo: EF 35%; b. 10/2021 Echo: EF 30-35%, mod reduced RV fxn, mod dil LA.   Hyperlipidemia    Hypertension    Ischemic cardiomyopathy    a. 10/2020 Echo: EF 35%. Nl RVSP. Mod MR. Mild TR; b. 11/2020 Cath:  CO/CI 5.05/2.49. LV gram:  EF 35%; c. 10/2021 Echo: EF 30-35%, mod reduced RV fxn, mod dil LA.   Lower extremity cellulitis    NSTEMI (non-ST elevated myocardial infarction) (HCC) 08/09/2021   a.) R/LHC 08/14/2021: 30% m-d-LM, 85% oLAD, 50% pLAD, 35% dLAD, 80% D1, 50% pLCx, 70% OM1, 100% p-dRCA -> transfer to [facility]. b.) 08/15/2021 Impella supported HIGH RISK PCI with atherectomy. 2.75 x 26mm Onyx Frontier DES to [employer].   Peripheral vascular disease in diabetes mellitus (HCC)    a. 06/2012 95% occlusion s/p PTA R SFA (Dr. Wyn Quaker); b. 03/2021 ABIs: R 1.02, L 0.96.   Pneumonia    Type 2 diabetes mellitus treated with insulin (HCC)     Medications:  Scheduled:   acetaminophen  650 mg Oral Q4H   Or   acetaminophen (TYLENOL) oral liquid 160 mg/5 mL  650 mg Per Tube Q4H   Or   acetaminophen  650 mg Rectal Q4H   aspirin EC  81 mg Oral Daily   Chlorhexidine Gluconate Cloth  6 each Topical Q0600   clopidogrel  75 mg Per Tube Daily   docusate  100 mg Per Tube BID   insulin aspart  0-20 Units Subcutaneous Q4H   midazolam  2 mg Intravenous Once   midazolam PF  2 mg Intravenous Once   mouth rinse  15 mL Mouth Rinse Q2H   pantoprazole (PROTONIX) IV  40 mg Intravenous QHS   polyethylene glycol  17 g Per Tube Daily   Infusions:   DOPamine Stopped (02/09/2023 2000)   epinephrine 4  mcg/min (02/06/2023 0400)   fentaNYL infusion INTRAVENOUS     heparin 1,050 Units/hr (02/01/2023 0400)   lidocaine 1 mg/min (02/16/2023 0400)   midazolam 4 mg/hr (01/26/2023 0400)   norepinephrine (LEVOPHED) Adult infusion 30 mcg/min (01/27/2023 0457)   propofol (DIPRIVAN) infusion 15 mcg/kg/min (02/03/2023 0400)   PRN: [START ON 02/09/2023] acetaminophen **OR** [START ON 02/09/2023] acetaminophen (TYLENOL) oral liquid 160 mg/5 mL **OR** [START ON 02/09/2023] acetaminophen, fentaNYL, ondansetron (ZOFRAN) IV, mouth rinse  Assessment: Henry Lindsey is a 89 y.o. male presenting after out-of-hospital cardiac arrest (unknown downtime). PMH significant for HTN, T2DM, stroke, CAD, HFrEF, COPD, HLD, PAD. Patient was not on [facility] PTA per chart review. Patient found down and no bystander CPR was performed. Upon EMS arrival he was noted to be in PEA with return of ROSC after a few minutes of ACLS.  As they were moving the patient to the ambulance for transport he again lost his pulse with return of ROSC after 2 minutes of ACLS. Advanced airway was placed in the field as he remained unconscious following ROSC. He had third episode of cardiac arrest in the ED, again with approximately 2 minutes of ACLS with return of ROSC. Post ROSC EKG with EMS was concerning for STEMI and code STEMI was activated on arrival to the ED. Patient now admitted to ICU. Pharmacy has been consulted to initiate and manage heparin infusion.   Baseline Labs: Hgb 12.8, Hct 45.1, Plt 111. aPTT, PT, INR ordered.  Goal of Therapy:  Heparin level 0.3-0.7 units/ml Monitor platelets by anticoagulation protocol: Yes   0420 0246 HL 0.44, therapeutic x 1  Plan:  Continue heparin infusion at 1050 units/hr Recheck HL in 8 hours to confirm  Continue to monitor H&H and platelets daily while on heparin infusion   Otelia Sergeant, PharmD, [facility] 02/01/2023 5:18 AM

## 2023-02-19 NOTE — Progress Notes (Signed)
Called [facility] to notify them that pt's family wants to proceed and make pt comfort care.... Due to pt's age, ok for [place] to extubate pt. Since he is not a candidate for organ donation.

## 2023-02-19 NOTE — Consult Note (Signed)
Neurology Consultation Reason for Consult: Anoxic brain injury Referring Physician: Verlon Setting, F  CC: Anoxic brain injury  History is obtained from: Chart review, wife  HPI: Henry Lindsey is a 89 y.o. male who was at home working in his yard when he was found down after seeing him about 10 minutes prior.  Initial rhythm was PEA, ROSC after 2 minutes of CPR.  He subsequently had another arrest with ROSC after 2 minutes of ACLS.   He developed myoclonus yesterday evening and has been started on propofol and Versed.   Past Medical History:  Diagnosis Date   Anginal pain (HCC)    Aortic atherosclerosis (HCC)    Arthritis    BPH (benign prostatic hyperplasia)    CAD (coronary artery disease)    a.) 2/22 Cath: LM 50d, LAD sev ost dz, mod-sev prox/mid dz. D1 sev dz. LCX mild prox dzs, OM2 mod-sev dz, RCA 100p. RPL and RPDA fill via L-R collats. EF 35%. Seen by CVTS->Med mgmt. b.) R/LHC 08/14/21: 30% m-d-LM, 85% oLAD, 50% pLAD, 35% dLAD, 80% D1, 50% pLCx, 70% OM1, 100% p-dRCA -> trans to [facility]. c.) 08/15/21 Impella supported HIGH RISK PCI with atherectomy. 2.75 x 26mm Onyx Frontier DES oLAD.   Carotid artery stenosis    a. 11/2010 U/S: <50% bilaterally; b. 03/2021 40-59% bilat ICA stenoses.   COPD (chronic obstructive pulmonary disease) (HCC)    noted CT 10/05/16 former smoker quit age 23    CVA (cerebral infarction) 11/2010   a.) RIGHT thalamic lacunar   GERD (gastroesophageal reflux disease)    Heart murmur    HFrEF (heart failure with reduced ejection fraction) (HCC)    a. 10/2020 Echo: EF 35%; b. 10/2021 Echo: EF 30-35%, mod reduced RV fxn, mod dil LA.   Hyperlipidemia    Hypertension    Ischemic cardiomyopathy    a. 10/2020 Echo: EF 35%. Nl RVSP. Mod MR. Mild TR; b. 11/2020 Cath:  CO/CI 5.05/2.49. LV gram: EF 35%; c. 10/2021 Echo: EF 30-35%, mod reduced RV fxn, mod dil LA.   Lower extremity cellulitis    NSTEMI (non-ST elevated myocardial infarction) (HCC) 08/09/2021   a.) R/LHC  08/14/2021: 30% m-d-LM, 85% oLAD, 50% pLAD, 35% dLAD, 80% D1, 50% pLCx, 70% OM1, 100% p-dRCA -> transfer to [facility]. b.) 08/15/2021 Impella supported HIGH RISK PCI with atherectomy. 2.75 x 26mm Onyx Frontier DES to [employer].   Peripheral vascular disease in diabetes mellitus (HCC)    a. 06/2012 95% occlusion s/p PTA R SFA (Dr. Wyn Quaker); b. 03/2021 ABIs: R 1.02, L 0.96.   Pneumonia    Type 2 diabetes mellitus treated with insulin (HCC)      Family History  Problem Relation Age of Onset   Diabetes Mother      Social History:  reports that he quit smoking about 54 years ago. His smoking use included cigarettes. He has a 16.50 pack-year smoking history. He has never used smokeless tobacco. He reports that he does not drink alcohol and does not use drugs.   Exam: Current vital signs: BP 98/80 (BP Location: Left Arm)   Pulse (!) 55   Temp 98.6 F (37 C)   Resp (!) 24   Ht 5\' 11"  (1.803 m)   Wt 87.4 kg   SpO2 99%   BMI 26.87 kg/m  Vital signs in last 24 hours: Temp:  [95.1 F (35.1 C)-98.6 F (37 C)] 98.6 F (37 C) Feb 10, 2023 0921) Pulse Rate:  [25-119] 55 02/10/2023 0921) Resp:  [16-31]  24 (04/20 0921) BP: (53-156)/(30-109) 98/80 (04/20 0000) SpO2:  [93 %-100 %] 99 % (04/20 0921) Arterial Line BP: (94-161)/(40-67) 115/43 (04/20 0921) FiO2 (%):  [35 %-100 %] 35 % (04/20 0800) Weight:  [87.1 kg-88.3 kg] 87.4 kg (04/20 0313)   Physical Exam  Mental Status: Patient does not respond to verbal stimuli.  Does not respond to deep sternal rub.  Does not follow commands.  No verbalizations are noted.   Cranial Nerves: II: patient does not respond confrontation bilaterally, pupils right 3 mm, left 3 mm,and reactive bilaterally III,IV,VI: Doll's eye responses absent bilaterally. V,VII: corneal reflex absent bilaterally  VIII: patient does not respond to verbal stimuli IX,X: Cough reflex absent,  XI: unable to test bilaterally due to coma XII: unable to test due to coma  Motor: Extremities flaccid  throughout.  No spontaneous movement noted.  No purposeful movements noted.  Sensory: Does not respond to noxious stimuli in any extremity.  Cerebellar: Unable to perform due to coma  Gait: Unable to perform due to coma       I have reviewed labs in epic and the results pertinent to this consultation are: Results for orders placed or performed during the hospital encounter of 01/20/2023 (from the past 24 hour(s))  CBC with Differential     Status: Abnormal   Collection Time: 02/16/2023  2:45 PM  Result Value Ref Range   WBC 9.7 4.0 - 10.5 K/uL   RBC 4.43 4.22 - 5.81 MIL/uL   Hemoglobin 12.8 (L) 13.0 - 17.0 g/dL   HCT 16.1 09.6 - 04.5 %   MCV 101.8 (H) 80.0 - 100.0 fL   MCH 28.9 26.0 - 34.0 pg   MCHC 28.4 (L) 30.0 - 36.0 g/dL   RDW 40.9 81.1 - 91.4 %   Platelets 111 (L) 150 - 400 K/uL   nRBC 0.2 0.0 - 0.2 %   Neutrophils Relative % 40 %   Neutro Abs 3.9 1.7 - 7.7 K/uL   Lymphocytes Relative 53 %   Lymphs Abs 5.1 (H) 0.7 - 4.0 K/uL   Monocytes Relative 4 %   Monocytes Absolute 0.3 0.1 - 1.0 K/uL   Eosinophils Relative 0 %   Eosinophils Absolute 0.0 0.0 - 0.5 K/uL   Basophils Relative 0 %   Basophils Absolute 0.0 0.0 - 0.1 K/uL   Immature Granulocytes 3 %   Abs Immature Granulocytes 0.31 (H) 0.00 - 0.07 K/uL  Troponin I (High Sensitivity)     Status: Abnormal   Collection Time: 02/02/2023  2:45 PM  Result Value Ref Range   Troponin I (High Sensitivity) 81 (H) <18 ng/L  Comprehensive metabolic panel     Status: Abnormal   Collection Time: 02/04/2023  2:45 PM  Result Value Ref Range   Sodium 136 135 - 145 mmol/L   Potassium 4.2 3.5 - 5.1 mmol/L   Chloride 101 98 - 111 mmol/L   CO2 20 (L) 22 - 32 mmol/L   Glucose, Bld 431 (H) 70 - 99 mg/dL   BUN 48 (H) 8 - 23 mg/dL   Creatinine, Ser 7.82 (H) 0.61 - 1.24 mg/dL   Calcium 7.9 (L) 8.9 - 10.3 mg/dL   Total Protein 5.7 (L) 6.5 - 8.1 g/dL   Albumin 3.2 (L) 3.5 - 5.0 g/dL   AST 956 (H) 15 - 41 U/L   ALT 96 (H) 0 - 44 U/L    Alkaline Phosphatase 57 38 - 126 U/L   Total Bilirubin 0.9 0.3 - 1.2 mg/dL  GFR, Estimated 34 (L) >60 mL/min   Anion gap 15 5 - 15  Procalcitonin     Status: None   Collection Time: 18-Feb-2023  2:45 PM  Result Value Ref Range   Procalcitonin <0.10 ng/mL  Beta-hydroxybutyric acid     Status: Abnormal   Collection Time: 2023-02-18  2:45 PM  Result Value Ref Range   Beta-Hydroxybutyric Acid 0.40 (H) 0.05 - 0.27 mmol/L  Hemoglobin A1c     Status: Abnormal   Collection Time: Feb 18, 2023  2:45 PM  Result Value Ref Range   Hgb A1c MFr Bld 8.7 (H) 4.8 - 5.6 %   Mean Plasma Glucose 202.99 mg/dL  Blood gas, arterial     Status: Abnormal   Collection Time: 18-Feb-2023  3:01 PM  Result Value Ref Range   FIO2 100 %   Mode PRESSURE REGULATED VOLUME CONTROL    MECHVT 500 mL   PEEP 10 cm H20   pH, Arterial <6.95 (LL) 7.35 - 7.45   pCO2 arterial 84 (HH) 32 - 48 mmHg   pO2, Arterial 208 (H) 83 - 108 mmHg   Bicarbonate 17.2 (L) 20.0 - 28.0 mmol/L   Acid-base deficit 16.7 (H) 0.0 - 2.0 mmol/L   O2 Saturation 100 %   Patient temperature 37.0    Collection site LEFT RADIAL    Allens test (pass/fail) PASS PASS   Mechanical Rate 20   Urinalysis, Complete w Microscopic -Urine, Unspecified Source     Status: Abnormal   Collection Time: 2023/02/18  3:33 PM  Result Value Ref Range   Color, Urine YELLOW (A) YELLOW   APPearance CLEAR (A) CLEAR   Specific Gravity, Urine 1.015 1.005 - 1.030   pH 5.0 5.0 - 8.0   Glucose, UA >=500 (A) NEGATIVE mg/dL   Hgb urine dipstick MODERATE (A) NEGATIVE   Bilirubin Urine NEGATIVE NEGATIVE   Ketones, ur NEGATIVE NEGATIVE mg/dL   Protein, ur NEGATIVE NEGATIVE mg/dL   Nitrite NEGATIVE NEGATIVE   Leukocytes,Ua NEGATIVE NEGATIVE   RBC / HPF 6-10 0 - 5 RBC/hpf   WBC, UA 0-5 0 - 5 WBC/hpf   Bacteria, UA NONE SEEN NONE SEEN   Squamous Epithelial / HPF 0-5 0 - 5 /HPF   Mucus PRESENT   Urine Drug Screen, Qualitative ([facility] only)     Status: Abnormal   Collection Time: Feb 18, 2023   3:33 PM  Result Value Ref Range   Tricyclic, Ur Screen NONE DETECTED NONE DETECTED   Amphetamines, Ur Screen NONE DETECTED NONE DETECTED   MDMA (Ecstasy)Ur Screen NONE DETECTED NONE DETECTED   Cocaine Metabolite,Ur [place] NONE DETECTED NONE DETECTED   Opiate, Ur Screen POSITIVE (A) NONE DETECTED   Phencyclidine (PCP) Ur S NONE DETECTED NONE DETECTED   Cannabinoid 50 Ng, Ur [place] NONE DETECTED NONE DETECTED   Barbiturates, Ur Screen NONE DETECTED NONE DETECTED   Benzodiazepine, Ur Scrn NONE DETECTED NONE DETECTED   Methadone Scn, Ur NONE DETECTED NONE DETECTED  Lactic acid, plasma     Status: Abnormal   Collection Time: 02-18-2023  3:34 PM  Result Value Ref Range   Lactic Acid, Venous >9.0 (HH) 0.5 - 1.9 mmol/L  CBC with Differential/Platelet     Status: Abnormal   Collection Time: 02-18-2023  3:42 PM  Result Value Ref Range   WBC 13.4 (H) 4.0 - 10.5 K/uL   RBC 4.65 4.22 - 5.81 MIL/uL   Hemoglobin 13.9 13.0 - 17.0 g/dL   HCT 16.1 09.6 - 04.5 %   MCV 98.5 80.0 -  100.0 fL   MCH 29.9 26.0 - 34.0 pg   MCHC 30.3 30.0 - 36.0 g/dL   RDW 16.1 09.6 - 04.5 %   Platelets 185 150 - 400 K/uL   nRBC 0.2 0.0 - 0.2 %   Neutrophils Relative % 65 %   Neutro Abs 8.6 (H) 1.7 - 7.7 K/uL   Lymphocytes Relative 29 %   Lymphs Abs 3.9 0.7 - 4.0 K/uL   Monocytes Relative 2 %   Monocytes Absolute 0.3 0.1 - 1.0 K/uL   Eosinophils Relative 0 %   Eosinophils Absolute 0.1 0.0 - 0.5 K/uL   Basophils Relative 0 %   Basophils Absolute 0.1 0.0 - 0.1 K/uL   Immature Granulocytes 4 %   Abs Immature Granulocytes 0.55 (H) 0.00 - 0.07 K/uL  Lactic acid, plasma     Status: Abnormal   Collection Time: 02/14/2023  3:52 PM  Result Value Ref Range   Lactic Acid, Venous 5.0 (HH) 0.5 - 1.9 mmol/L  Troponin I (High Sensitivity)     Status: Abnormal   Collection Time: 01/31/2023  4:56 PM  Result Value Ref Range   Troponin I (High Sensitivity) 613 (HH) <18 ng/L  MRSA Next Gen by PCR, Nasal     Status: None   Collection Time:  01/21/2023  5:53 PM   Specimen: Nasal Mucosa; Nasal Swab  Result Value Ref Range   MRSA by PCR Next Gen NOT DETECTED NOT DETECTED  Glucose, capillary     Status: Abnormal   Collection Time: 02/12/2023  5:56 PM  Result Value Ref Range   Glucose-Capillary 411 (H) 70 - 99 mg/dL  APTT     Status: None   Collection Time: 01/24/2023  6:59 PM  Result Value Ref Range   aPTT 35 24 - 36 seconds  Protime-INR     Status: Abnormal   Collection Time: 01/29/2023  6:59 PM  Result Value Ref Range   Prothrombin Time 17.4 (H) 11.4 - 15.2 seconds   INR 1.4 (H) 0.8 - 1.2  Glucose, capillary     Status: Abnormal   Collection Time: 02/02/2023  7:12 PM  Result Value Ref Range   Glucose-Capillary 464 (H) 70 - 99 mg/dL  Blood gas, arterial     Status: Abnormal   Collection Time: 02/02/2023  8:00 PM  Result Value Ref Range   FIO2 50 %   Delivery systems VENTILATOR    Mode PRESSURE REGULATED VOLUME CONTROL    MECHVT 600 mL   PEEP 10 cm H20   pH, Arterial 7.27 (L) 7.35 - 7.45   pCO2 arterial 32 32 - 48 mmHg   pO2, Arterial 226 (H) 83 - 108 mmHg   Bicarbonate 14.7 (L) 20.0 - 28.0 mmol/L   Acid-base deficit 11.0 (H) 0.0 - 2.0 mmol/L   O2 Saturation 100 %   Patient temperature 37.0    Collection site RIGHT RADIAL    Drawn by NASOPHARYNGEAL    Allens test (pass/fail) PASS PASS   Mechanical Rate 24   Lactic acid, plasma     Status: Abnormal   Collection Time: 02/17/2023  8:00 PM  Result Value Ref Range   Lactic Acid, Venous 7.1 (HH) 0.5 - 1.9 mmol/L  Troponin I (High Sensitivity)     Status: Abnormal   Collection Time: 02/14/2023  8:00 PM  Result Value Ref Range   Troponin I (High Sensitivity) 4,565 (HH) <18 ng/L  Basic metabolic panel     Status: Abnormal   Collection Time: 02/10/2023  8:00 PM  Result Value Ref Range   Sodium 136 135 - 145 mmol/L   Potassium 4.1 3.5 - 5.1 mmol/L   Chloride 102 98 - 111 mmol/L   CO2 14 (L) 22 - 32 mmol/L   Glucose, Bld 476 (H) 70 - 99 mg/dL   BUN 55 (H) 8 - 23 mg/dL    Creatinine, Ser 6.96 (H) 0.61 - 1.24 mg/dL   Calcium 7.7 (L) 8.9 - 10.3 mg/dL   GFR, Estimated 28 (L) >60 mL/min   Anion gap 20 (H) 5 - 15  Blood gas, arterial     Status: Abnormal (Preliminary result)   Collection Time: 27-Feb-2023 10:49 PM  Result Value Ref Range   FIO2 50 %   Delivery systems VENTILATOR    Mode PRESSURE REGULATED VOLUME CONTROL    MECHVT 600 mL   PEEP 10 cm H20   pH, Arterial 7.39 7.35 - 7.45   pCO2 arterial 30 (L) 32 - 48 mmHg   pO2, Arterial 203 (H) 83 - 108 mmHg   Bicarbonate 18.2 (L) 20.0 - 28.0 mmol/L   Acid-base deficit 5.6 (H) 0.0 - 2.0 mmol/L   O2 Saturation 100 %   Patient temperature 37.0    Collection site PENDING    Drawn by PENDING    Allens test (pass/fail) PENDING PASS   Mechanical Rate 24   Lactic acid, plasma     Status: Abnormal   Collection Time: 02-27-23 11:00 PM  Result Value Ref Range   Lactic Acid, Venous 5.5 (HH) 0.5 - 1.9 mmol/L  Troponin I (High Sensitivity)     Status: Abnormal   Collection Time: 2023/02/27 11:00 PM  Result Value Ref Range   Troponin I (High Sensitivity) >24,000 (HH) <18 ng/L  Glucose, capillary     Status: Abnormal   Collection Time: 02/02/2023 12:04 AM  Result Value Ref Range   Glucose-Capillary 334 (H) 70 - 99 mg/dL  Triglycerides     Status: None   Collection Time: 01/23/2023  2:46 AM  Result Value Ref Range   Triglycerides 78 <150 mg/dL  Renal function panel     Status: Abnormal   Collection Time: 01/24/2023  2:46 AM  Result Value Ref Range   Sodium 138 135 - 145 mmol/L   Potassium 3.6 3.5 - 5.1 mmol/L   Chloride 105 98 - 111 mmol/L   CO2 20 (L) 22 - 32 mmol/L   Glucose, Bld 340 (H) 70 - 99 mg/dL   BUN 56 (H) 8 - 23 mg/dL   Creatinine, Ser 2.95 (H) 0.61 - 1.24 mg/dL   Calcium 7.6 (L) 8.9 - 10.3 mg/dL   Phosphorus 4.8 (H) 2.5 - 4.6 mg/dL   Albumin 2.7 (L) 3.5 - 5.0 g/dL   GFR, Estimated 29 (L) >60 mL/min   Anion gap 13 5 - 15  Hepatic function panel     Status: Abnormal   Collection Time: 01/23/2023  2:46 AM   Result Value Ref Range   Total Protein 5.2 (L) 6.5 - 8.1 g/dL   Albumin 2.7 (L) 3.5 - 5.0 g/dL   AST 284 (H) 15 - 41 U/L   ALT 190 (H) 0 - 44 U/L   Alkaline Phosphatase 54 38 - 126 U/L   Total Bilirubin 0.7 0.3 - 1.2 mg/dL   Bilirubin, Direct 0.1 0.0 - 0.2 mg/dL   Indirect Bilirubin 0.6 0.3 - 0.9 mg/dL  Magnesium     Status: None   Collection Time: 01/28/2023  2:46 AM  Result  Value Ref Range   Magnesium 2.4 1.7 - 2.4 mg/dL  Heparin level (unfractionated)     Status: None   Collection Time: 01/25/2023  2:46 AM  Result Value Ref Range   Heparin Unfractionated 0.44 0.30 - 0.70 IU/mL  Lactic acid, plasma     Status: Abnormal   Collection Time: 01/25/2023  2:46 AM  Result Value Ref Range   Lactic Acid, Venous 3.8 (HH) 0.5 - 1.9 mmol/L  Glucose, capillary     Status: Abnormal   Collection Time: 02/16/2023  3:10 AM  Result Value Ref Range   Glucose-Capillary 289 (H) 70 - 99 mg/dL  CBC     Status: Abnormal   Collection Time: 02/11/2023  4:00 AM  Result Value Ref Range   WBC 8.9 4.0 - 10.5 K/uL   RBC 4.28 4.22 - 5.81 MIL/uL   Hemoglobin 12.5 (L) 13.0 - 17.0 g/dL   HCT 21.3 08.6 - 57.8 %   MCV 91.1 80.0 - 100.0 fL   MCH 29.2 26.0 - 34.0 pg   MCHC 32.1 30.0 - 36.0 g/dL   RDW 46.9 62.9 - 52.8 %   Platelets 180 150 - 400 K/uL   nRBC 0.3 (H) 0.0 - 0.2 %  Lactic acid, plasma     Status: Abnormal   Collection Time: 02/10/2023  6:00 AM  Result Value Ref Range   Lactic Acid, Venous 3.3 (HH) 0.5 - 1.9 mmol/L  Glucose, capillary     Status: Abnormal   Collection Time: 01/26/2023  7:30 AM  Result Value Ref Range   Glucose-Capillary 204 (H) 70 - 99 mg/dL     I have reviewed the images obtained: CT head-I have concerns that he has blurring of the deep nuclei, but given concerns for movement will repeat  Impression: 89 year old male with likely dismal prognosis following anoxic brain injury based on malignant EEG, early myoclonus within 24 hours, 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  I will repeat his head  CT.  Recommendations: 1) continue discussion with family, likely poor prognosis. 2) repeat head CT  This patient is critically ill and at significant risk of neurological worsening, death and care requires constant monitoring of vital signs, hemodynamics,respiratory and cardiac monitoring, neurological assessment, discussion with family, other specialists and medical decision making of high complexity. I spent 35 minutes of neurocritical care time  in the care of  this patient. This was time spent independent of any time provided by nurse practitioner or PA.  Ritta Slot, MD [facility] [phone]  If 7pm- 7am, please page neurology on call as listed in AMION. 02/18/2023  10:12 AM

## 2023-02-19 NOTE — Progress Notes (Signed)
  Echocardiogram 2D Echocardiogram has been performed. Definity IV ultrasound imaging agent used on this study.  Lenor Coffin 02/15/2023, 8:32 AM

## 2023-02-19 NOTE — Procedures (Addendum)
Arterial Catheter Insertion Procedure Note  Henry Lindsey  [id number]  10/27/1932  Date:01/23/2023  Time:6:06 AM    Provider Performing: Loraine Leriche   Procedure: Insertion of Arterial Line (40981) with US guidance (19147)   Indication(s) Blood pressure monitoring and/or need for frequent ABGs  Consent Risks of the procedure as well as the alternatives and risks of each were explained to the patient and/or caregiver.  Consent for the procedure was obtained and is signed in the bedside chart  Anesthesia None  Time Out Verified patient identification, verified procedure, site/side was marked, verified correct patient position, special equipment/implants available, medications/allergies/relevant history reviewed, required imaging and test results available.  Sterile Technique Maximal sterile technique including full sterile barrier drape, hand hygiene, sterile gown, sterile gloves, mask, hair covering, sterile ultrasound probe cover (if used).  Procedure Description Area of catheter insertion was cleaned with chlorhexidine and draped in sterile fashion. With real-time ultrasound guidance an arterial catheter was placed into the right radial artery.  Appropriate arterial tracings confirmed on monitor.    Complications/Tolerance None; patient tolerated the procedure well.  EBL Minimal  Specimen(s) None   [name], DNP, CCRN, FNP-C, AGACNP-BC Acute Care & Family Nurse Practitioner  [facility]  See Amion for personal pager PCCM on call pager [phone] until 7 am

## 2023-02-19 NOTE — Progress Notes (Signed)
Family will call to give funeral home info.

## 2023-02-19 NOTE — Progress Notes (Signed)
Overnight patient noted with multiples runs of PVCs and wide complex tachycardia. Patient started on Levo and weaned off Epi and Dopamine with no improvement. Amiodarone 150 mg bolus administered and cardiology consulted with recommendations to start Lidocaine bolus followed by gtt with immediate improvement noted. Patient became hypotension post Lidocaine bolus and Epi restarted at 52mcg/kg.Arterial Blood Gas result:  pO2 226; pCO2 32; pH 7.27;  HCO3 14.7, %O2 Sat 100. Patient received sodium bicarb x 2, calcium gluconate and Mag 2 gm. Patient's family members updated at the bedside, they agree with current plan of care. Pending EEG and Neuro consult for prognostication.   [name], DNP, CCRN, FNP-C, AGACNP-BC Acute Care & Family Nurse Practitioner  [facility]  See Amion for personal pager PCCM on call pager [phone] until 7 am

## 2023-02-19 NOTE — H&P (Signed)
NAME:  Henry Lindsey, MRN:  [id number], DOB:  1932-12-29, LOS: 1 ADMISSION DATE:  02/27/2023, CONSULTATION DATE:  02/27/23 REFERRING MD:  Dr. Scotty Court, CHIEF COMPLAINT:  Cardiac Arrest   Brief Pt Description / Synopsis:  89 y.o. male admitted with out of hospital PEA cardiac arrest (3 separate arrests). Now with profound shock, multiorgan failure, and concern for anoxic brain injury.  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  Patient currently unresponsive and unable to contribute to history, therefore history is obtained from chart review.  Per ED and nursing notes, construction workers at the patient's home and found him unconscious on the ground.  Unclear of downtime prior to him being found unresponsive, reportedly he was seen 10 minutes prior.  No bystander CPR was performed.  Upon EMS arrival he was noted to be apneic and in PEA which they did 2 minutes of CPR, 2 cardioversions, and 2 A of epinephrine with return of ROSC.  As they were moving the patient to the ambulance for transport he again lost his pulse with return of ROSC after 2 minutes of ACLS.  Advanced airway was placed in the field as he remained unconscious following ROSC.  He had third episode of cardiac arrest in the ED, again with approximately 2 minutes of ACLS with return of ROSC.  Post ROSC EKG with EMS was concerning for STEMI and code STEMI was activated on arrival to the ED.  He was evaluated by Dr. Ignacia Felling cardiology who advised he  is not a candidate for PCI.  He has previously been evaluated by cardiology and was turned down for CABG due to overall poor health.  ED Course: Initial Vital Signs: Temperature 96.1 F, pulse 62, respiratory rate 17, blood pressure 130/60, SpO2 95% Significant Labs: Bicarb 20, glucose 431, BUN 48, creatinine 1.88, anion gap 15, albumin 3.2, AST 114, ALT 96, high-sensitivity troponin 81, WBC 9.7, procalcitonin less than 0.10, hemoglobin 12.8, platelets 111, beta hydroxybutyrate acid 0.4, lactic acid greater than 9 ABG postintubation: pH less than 6.5/pCO2 84/pO2 208/bicarb 17.2 Urinalysis negative for UTI, no ketones Imaging Chest X-ray>>FINDINGS: ET tube in place with tip proximally 4 cm above the carina. Enteric tube in place with tip extending beneath the diaphragm. Overlapping cardiac leads and defibrillator pads. Borderline cardiopericardial silhouette with some vascular congestion. No pneumothorax, effusion or edema. Apical pleural thickening. CT head is pending Medications Administered: 1 L normal saline bolus, required initiation of dopamine and epinephrine drips  PCCM is asked to admit the patient to ICU for further workup and treatment.  Please see "significant hospital events" section below for full detailed hospital course.  02/10/2023- patient with nonsurvivable findings on CT head and EEG.  S/p neurology consultation with goals of care discussion and agreement to proceed to comfort care measures.   Pertinent  Medical History   Past Medical History:  Diagnosis Date   Anginal pain (HCC)    Aortic atherosclerosis (HCC)    Arthritis    BPH (benign prostatic hyperplasia)    CAD (coronary artery disease)  a.) 2/22 Cath: LM 50d, LAD sev ost dz, mod-sev prox/mid dz. D1 sev dz. LCX mild prox dzs, OM2 mod-sev dz, RCA 100p. RPL and RPDA fill via L-R collats. EF 35%. Seen by CVTS->Med mgmt. b.) R/LHC 08/14/21: 30% m-d-LM, 85% oLAD, 50% pLAD, 35% dLAD, 80% D1, 50% pLCx, 70% OM1, 100%  p-dRCA -> trans to [facility]. c.) 08/15/21 Impella supported HIGH RISK PCI with atherectomy. 2.75 x 26mm Onyx Frontier DES oLAD.   Carotid artery stenosis    a. 11/2010 U/S: <50% bilaterally; b. 03/2021 40-59% bilat ICA stenoses.   COPD (chronic obstructive pulmonary disease) (HCC)    noted CT 10/05/16 former smoker quit age 79    CVA (cerebral infarction) 11/2010   a.) RIGHT thalamic lacunar   GERD (gastroesophageal reflux disease)    Heart murmur    HFrEF (heart failure with reduced ejection fraction) (HCC)    a. 10/2020 Echo: EF 35%; b. 10/2021 Echo: EF 30-35%, mod reduced RV fxn, mod dil LA.   Hyperlipidemia    Hypertension    Ischemic cardiomyopathy    a. 10/2020 Echo: EF 35%. Nl RVSP. Mod MR. Mild TR; b. 11/2020 Cath:  CO/CI 5.05/2.49. LV gram: EF 35%; c. 10/2021 Echo: EF 30-35%, mod reduced RV fxn, mod dil LA.   Lower extremity cellulitis    NSTEMI (non-ST elevated myocardial infarction) (HCC) 08/09/2021   a.) R/LHC 08/14/2021: 30% m-d-LM, 85% oLAD, 50% pLAD, 35% dLAD, 80% D1, 50% pLCx, 70% OM1, 100% p-dRCA -> transfer to [facility]. b.) 08/15/2021 Impella supported HIGH RISK PCI with atherectomy. 2.75 x 26mm Onyx Frontier DES to [employer].   Peripheral vascular disease in diabetes mellitus (HCC)    a. 06/2012 95% occlusion s/p PTA R SFA (Dr. Wyn Quaker); b. 03/2021 ABIs: R 1.02, L 0.96.   Pneumonia    Type 2 diabetes mellitus treated with insulin (HCC)      Micro Data:  N/A  Antimicrobials:   Anti-infectives (From admission, onward)    None       Significant Hospital Events: Including procedures, antibiotic start and stop dates in addition to other pertinent events   4/19: Presented to ED following out of hospital cardiac arrest.  And profound shock requiring dopamine and epinephrine drips.  Concern for anoxic brain injury on exam.  CODE STATUS changed to DNR.  Family would like allowed time for neuro prognostication. 4/19: After arrival to ICU exhibiting intermittent myoclonic jerking movements and  rhythmic fasciculations of the jaw and face.  EEG to be performed this evening.  Interim History / Subjective:  -comfort care measures per family wishes after GOC with Dr Amada Jupiter  Objective   Blood pressure 98/80, pulse (!) 55, temperature 98.6 F (37 C), resp. rate (!) 24, height 5\' 11"  (1.803 m), weight 87.4 kg, SpO2 99 %.    Vent Mode: PRVC FiO2 (%):  [35 %-100 %] 35 % Set Rate:  [20 bmp-24 bmp] 24 bmp Vt Set:  [500 mL-600 mL] 600 mL PEEP:  [10 cmH20] 10 cmH20 Plateau Pressure:  [21 cmH20-25 cmH20] 21 cmH20   Intake/Output Summary (Last 24 hours) at 08-[name]-2023 1033 Last data filed at 03-08-23 0800 Gross per 24 hour  Intake 3862.86 ml  Output 675 ml  Net 3187.86 ml   Filed Weights   02/05/2023 1421 02/02/2023 1749 03/08/2023 0313  Weight: 88.3 kg 87.1 kg 87.4 kg    Examination: General: Critically ill-appearing male, laying in bed, on no sedation, no acute distress HENT: Atraumatic, normocephalic, neck supple, no JVD, orally intubated  Lungs: Coarse breath sounds bilaterally, even, overbreathing the ventilator Cardiovascular: Tachycardia, regular rhythm, S1-S2, no murmurs, rubs, gallops Abdomen: Soft, nontender, nondistended, no guarding or rebound tenderness, bowel sounds positive x 4 Extremities: Laceration which has been sutured in the ED to the left lower extremity, no edema Neuro: Unresponsive on no sedation, does not withdraw from painful stimuli, no cough/gag/corneal reflexes, positive doll's eyes, pupils fixed at 4 mm bilaterally GU: Foley catheter in place with scant amount of dark yellow urine  Resolved Hospital Problem list     Assessment & Plan:   #Out-of-hospital PEA Cardiac Arrest #Elevated Troponin, demand ischemia vs NSTEMI #Shock: suspect Cardiogenic #Combined Systolic & Diastolic CHF secondary to ICM PMHx: severe CAD -Continuous cardiac monitoring -Maintain MAP >65 -Vasopressors as needed to maintain MAP goal ~ currently on Epi and Dopamine -Trend  lactic acid until normalized -Trend HS Troponin until peaked -Echocardiogram pending -Diuresis as BP and renal function permits ~ unable to diurese currently due to shock -Cardiology consulted, appreciate input  #Acute Hypoxic & Hypercapnic Respiratory Failure in the setting of cardiac arrest -Full vent support, implement lung protective strategies -Plateau pressures less than 30 cm H20 -Wean FiO2 & PEEP as tolerated to maintain O2 sats >92% -Follow intermittent Chest X-ray & ABG as needed -Spontaneous Breathing Trials when respiratory parameters met and mental status permits -Implement VAP Bundle -Prn Bronchodilators -Diuresis as BP and renal function permit  #Acute Kidney Injury #AG Metabolic Acidosis due to Lactic acidosis and AKI -Monitor I&O's / urinary output -Follow BMP -Ensure adequate renal perfusion -Avoid nephrotoxic agents as able -Replace electrolytes as indicated -Trend lactic  #Mildly elevated LFT's, maybe developing shock liver -Trend LFT's and coags  #Anemia without s/sx of overt blood loss #Thrombocytopenia -Monitor for S/Sx of bleeding -Trend CBC -SCD's for VTE Prophylaxis  -Transfuse for Hgb <7  #Hyperglycemia in setting of DM Type II Beta-hydroxybutyric acid slightly elevated, but urine negative for ketones -CBG's q4h; Target range of 140 to 180 -SSI -Follow ICU Hypo/Hyperglycemia protocol -Check Hgb A1c  #Concern for Anoxic Brain Injury #Myoclonus #Sedation needs in setting of mechanical ventilation -very poor prognosis per CT head - neurologist met with family and they are agreeable to comfort measures             -      Best Practice (right click and "Reselect all SmartList Selections" daily)   Diet/type: NPO DVT prophylaxis: SCD GI prophylaxis: PPI Lines: N/A Foley:  Yes, and it is still needed Code Status:  DNR Last date of multidisciplinary goals of care discussion [N/A]  4/19: Pt's daughter, granddaughter, and significant other  updated at bedside.  See IPAL note for full discussion.  They give consent for placement of central and arterial lines.  Labs   CBC: Recent Labs  Lab 01/30/2023 1445 02/05/2023 1542 02/13/2023 0400  WBC 9.7 13.4* 8.9  NEUTROABS 3.9 8.6*  --   HGB 12.8* 13.9 12.5*  HCT 45.1 45.8 39.0  MCV 101.8* 98.5 91.1  PLT 111* 185 180     Basic Metabolic Panel: Recent Labs  Lab 01/31/2023 1445 02/02/2023 2000 02/02/2023 0246  NA 136 136 138  K 4.2 4.1 3.6  CL 101 102 105  CO2 20* 14* 20*  GLUCOSE 431* 476* 340*  BUN 48* 55* 56*  CREATININE 1.88* 2.22* 2.13*  CALCIUM 7.9* 7.7* 7.6*  MG  --   --  2.4  PHOS  --   --  4.8*    GFR: Estimated Creatinine Clearance: 26 mL/min (  A) (by C-G formula based on SCr of 2.13 mg/dL (H)). Recent Labs  Lab 02-12-2023 1445 Feb 12, 2023 1534 02-12-23 1542 02-12-23 1552 02/12/2023 2000 02-12-2023 2300 02/14/2023 0246 02/06/2023 0400 02/02/2023 0600  PROCALCITON <0.10  --   --   --   --   --   --   --   --   WBC 9.7  --  13.4*  --   --   --   --  8.9  --   LATICACIDVEN  --    < >  --    < > 7.1* 5.5* 3.8*  --  3.3*   < > = values in this interval not displayed.     Liver Function Tests: Recent Labs  Lab 02-12-2023 1445 01/30/2023 0246  AST 114* 297*  ALT 96* 190*  ALKPHOS 57 54  BILITOT 0.9 0.7  PROT 5.7* 5.2*  ALBUMIN 3.2* 2.7*  2.7*    No results for input(s): "LIPASE", "AMYLASE" in the last 168 hours. No results for input(s): "AMMONIA" in the last 168 hours.  ABG    Component Value Date/Time   PHART 7.39 2023-02-12 2249   PCO2ART 30 (L) 02/12/23 2249   PO2ART 203 (H) February 12, 2023 2249   HCO3 18.2 (L) 02/12/23 2249   ACIDBASEDEF 5.6 (H) February 12, 2023 2249   O2SAT 100 02/12/2023 2249     Coagulation Profile: Recent Labs  Lab 02-12-23 1859  INR 1.4*    Cardiac Enzymes: No results for input(s): "CKTOTAL", "CKMB", "CKMBINDEX", "TROPONINI" in the last 168 hours.  HbA1C: Hgb A1c MFr Bld  Date/Time Value Ref Range Status  12-Feb-2023 02:45 PM  8.7 (H) 4.8 - 5.6 % Final    Comment:    (NOTE) Pre diabetes:          5.7%-6.4%  Diabetes:              >6.4%  Glycemic control for   <7.0% adults with diabetes   08/09/2021 04:58 PM 8.1 (H) 4.8 - 5.6 % Final    Comment:    (NOTE)         Prediabetes: 5.7 - 6.4         Diabetes: >6.4         Glycemic control for adults with diabetes: <7.0     CBG: Recent Labs  Lab 2023-02-12 1756 2023/02/12 1912 02/16/2023 0004 02/14/2023 0310 02/18/2023 0730  GLUCAP 411* 464* 334* 289* 204*    Review of Systems:   Unable to assess due to AMS/unresponsiveness/intubation/critical illness   Past Medical History:  He,  has a past medical history of Anginal pain (HCC), Aortic atherosclerosis (HCC), Arthritis, BPH (benign prostatic hyperplasia), CAD (coronary artery disease), Carotid artery stenosis, COPD (chronic obstructive pulmonary disease) (HCC), CVA (cerebral infarction) (11/2010), GERD (gastroesophageal reflux disease), Heart murmur, HFrEF (heart failure with reduced ejection fraction) (HCC), Hyperlipidemia, Hypertension, Ischemic cardiomyopathy, Lower extremity cellulitis, NSTEMI (non-ST elevated myocardial infarction) (HCC) (08/09/2021), Peripheral vascular disease in diabetes mellitus (HCC), Pneumonia, and Type 2 diabetes mellitus treated with insulin (HCC).   Surgical History:   Past Surgical History:  Procedure Laterality Date   ABDOMINAL AORTOGRAM N/A 08/14/2021   Procedure: ABDOMINAL AORTOGRAM;  Surgeon: Yvonne Kendall, MD;  Location: [facility] INVASIVE CV LAB;  Service: Cardiovascular;  Laterality: N/A;   CHOLECYSTECTOMY N/A 11/13/2016   Procedure: LAPAROSCOPIC CHOLECYSTECTOMY WITH INTRAOPERATIVE CHOLANGIOGRAM;  Surgeon: Henrene Dodge, MD;  Location: [facility] ORS;  Service: General;  Laterality: N/A;   COLONOSCOPY WITH PROPOFOL N/A 02/10/2018   Procedure: COLONOSCOPY WITH PROPOFOL;  Surgeon: Midge Minium, MD;  Location: [facility] ENDOSCOPY;  Service: Endoscopy;  Laterality: N/A;   CORONARY  ATHERECTOMY N/A 08/15/2021   Procedure: CORONARY ATHERECTOMY;  Surgeon: Iran Ouch, MD;  Location: [facility] INVASIVE CV LAB;  Service: Cardiovascular;  Laterality: N/A;   CORONARY STENT INTERVENTION W/IMPELLA N/A 08/15/2021   Procedure: Coronary Stent Intervention w/Impella;  Surgeon: Iran Ouch, MD;  Location: [facility] INVASIVE CV LAB;  Service: Cardiovascular;  Laterality: N/A;   ERCP N/A 11/17/2016   Procedure: ENDOSCOPIC RETROGRADE CHOLANGIOPANCREATOGRAPHY (ERCP);  Surgeon: Hilarie Fredrickson, MD;  Location: [facility] ENDOSCOPY;  Service: Endoscopy;  Laterality: N/A;   ERCP N/A 02/04/2017   Procedure: ENDOSCOPIC RETROGRADE CHOLANGIOPANCREATOGRAPHY (ERCP) Stent removal;  Surgeon: Midge Minium, MD;  Location: [facility] ENDOSCOPY;  Service: Endoscopy;  Laterality: N/A;   IR GENERIC HISTORICAL  10/06/2016   IR PERC CHOLECYSTOSTOMY 10/06/2016 Irish Lack, MD [facility]-INTERV RAD   JOINT REPLACEMENT Left 2014   left knee   RIGHT/LEFT HEART CATH AND CORONARY ANGIOGRAPHY N/A 11/24/2020   Procedure: RIGHT/LEFT HEART CATH AND CORONARY ANGIOGRAPHY;  Surgeon: Antonieta Iba, MD;  Location: [facility] INVASIVE CV LAB;  Service: Cardiovascular;  Laterality: N/A;   RIGHT/LEFT HEART CATH AND CORONARY ANGIOGRAPHY N/A 08/14/2021   Procedure: RIGHT/LEFT HEART CATH AND CORONARY ANGIOGRAPHY;  Surgeon: Yvonne Kendall, MD;  Location: [facility] INVASIVE CV LAB;  Service: Cardiovascular;  Laterality: N/A;   TOTAL KNEE ARTHROPLASTY Right 08/08/2020   Procedure: TOTAL KNEE ARTHROPLASTY;  Surgeon: Lyndle Herrlich, MD;  Location: [facility] ORS;  Service: Orthopedics;  Laterality: Right;   TOTAL KNEE REVISION Right 10/01/2021   Procedure: IRRIGATION AND DEBRIDMENT RIGHT KNEE WITH POLY EXCHANGE;  Surgeon: Lyndle Herrlich, MD;  Location: [facility] ORS;  Service: Orthopedics;  Laterality: Right;   UPPER GASTROINTESTINAL ENDOSCOPY     WRIST SURGERY       Social History:   reports that he quit smoking about 54 years ago. His smoking use included cigarettes. He  has a 16.50 pack-year smoking history. He has never used smokeless tobacco. He reports that he does not drink alcohol and does not use drugs.   Family History:  His family history includes Diabetes in his mother.   Allergies Allergies  Allergen Reactions   Keflex [Cephalexin] Rash   Jardiance [Empagliflozin]     Rash all over and lips turn purple.    Metformin And Related Diarrhea   Trazodone Other (See Comments)    Extreme hallucinations/ psychosis    Torsemide Rash    "Skin reaction"     Home Medications  Prior to Admission medications   Medication Sig Start Date End Date Taking? Authorizing Provider  ascorbic acid (VITAMIN C) 1000 MG tablet Take 1,000 mg by mouth daily.   Yes [provider]  aspirin EC 81 MG tablet Take 81 mg by mouth daily.   Yes [provider]  Blood Glucose Monitoring Suppl (ONE TOUCH ULTRA SYSTEM KIT) w/Device KIT 1 kit by Does not apply route once. Use DX code E11.59 10/18/15  Yes Sherlene Shams, MD  Cholecalciferol (VITAMIN D3) 50 MCG (2000 UT) TABS Take 2,000 Units by mouth daily.   Yes [provider]  clopidogrel (PLAVIX) 75 MG tablet TAKE 1 TABLET(75 MG) BY MOUTH DAILY WITH BREAKFAST 01/09/23  Yes Gollan, Tollie Pizza, MD  co-enzyme Q-10 30 MG capsule Take 100 mg by mouth daily.   Yes [provider]  dapagliflozin propanediol (FARXIGA) 10 MG TABS tablet Take 1 tablet (10 mg total) by mouth daily before breakfast.  12/20/22  Yes Antonieta Iba, MD  diclofenac Sodium (VOLTAREN) 1 % GEL Apply 2 g topically 3 (three) times daily as needed (pain).   Yes [provider]  ezetimibe (ZETIA) 10 MG tablet Take 1 tablet (10 mg total) by mouth daily. 03/05/22  Yes Gollan, Tollie Pizza, MD  furosemide (LASIX) 40 MG tablet TAKE 1 TABLET(40 MG) BY MOUTH DAILY 01/09/23  Yes Gollan, Tollie Pizza, MD  gabapentin (NEURONTIN) 400 MG capsule Take 400 mg by mouth 3 (three) times daily.   Yes [provider]  glipiZIDE (GLUCOTROL)  10 MG tablet Take 10 mg by mouth daily before breakfast.   Yes [provider]  HYDROcodone-acetaminophen (NORCO/VICODIN) 5-325 MG tablet Take 1 tablet by mouth every 4 (four) hours as needed for moderate pain (pain score 4-6). Patient taking differently: Take 1 tablet by mouth every 6 (six) hours as needed for moderate pain. 10/04/21  Yes Altamese Cabal, PA-C  INS SYRINGE/NEEDLE 1CC/28G (B-D INSULIN SYRINGE 1CC/28G) 28G X 1/2" 1 ML MISC USE TO ADMINISTER INSULIN DAILY 02/13/16  Yes Sherlene Shams, MD  insulin degludec (TRESIBA FLEXTOUCH) 100 UNIT/ML FlexTouch Pen Inject 20-26 Units into the skin See admin instructions. Inject 20u under the skin daily - increase by 2u every three days that FBG is >150 in the morning   Yes [provider]  latanoprost (XALATAN) 0.005 % ophthalmic solution Place 1 drop into both eyes at bedtime. 11/01/15  Yes [provider]  losartan (COZAAR) 25 MG tablet Take 0.5 tablets (12.5 mg total) by mouth daily. Patient taking differently: Take 25 mg by mouth daily. 10/30/21  Yes Hackney, Inetta Fermo A, FNP  metolazone (ZAROXOLYN) 2.5 MG tablet Take 1 tablet (2.5 mg total) by mouth as needed (As needed for weight gain). Patient taking differently: Take 2.5 mg by mouth daily as needed (excess fluid retention). 01/31/22  Yes Creig Hines, NP  metoprolol succinate (TOPROL-XL) 25 MG 24 hr tablet TAKE 1 TABLET(25 MG) BY MOUTH DAILY 11/04/22  Yes Gollan, Tollie Pizza, MD  Multiple Vitamin (MULTIVITAMIN WITH MINERALS) TABS tablet Take 1 tablet by mouth in the morning.   Yes [provider]  nitroGLYCERIN (NITROSTAT) 0.4 MG SL tablet PLACE 1 TABLET UNDER THE TONGUE EVERY 5 MINUTES AS NEEDED FOR CHEST PAIN. MAX 3 TABLETS 04/17/22  Yes Gollan, Tollie Pizza, MD  ONE TOUCH ULTRA TEST test strip USE 1 STRIP TO TEST THREE TIMES DAILY AS DIRECTED 08/31/18  Yes Sherlene Shams, MD  [place] DELICA LANCETS 33G MISC Use three times daily to check blood sugar.  10/18/15  Yes Sherlene Shams, MD  pantoprazole (PROTONIX) 40 MG tablet Take 40 mg by mouth daily.   Yes [provider]  rosuvastatin (CRESTOR) 10 MG tablet Take 1 tablet (10 mg total) by mouth daily. 03/05/22  Yes Gollan, Tollie Pizza, MD  tamsulosin (FLOMAX) 0.4 MG CAPS capsule Take 0.4 mg by mouth daily after breakfast.   Yes [provider]  timolol (TIMOPTIC) 0.5 % ophthalmic solution Place 1 drop into both eyes in the morning. 12/19/17  Yes [provider]  traZODone (DESYREL) 50 MG tablet Take 50 mg by mouth at bedtime.   Yes [provider]  vitamin E 180 MG (400 UNITS) capsule Take 400 Units by mouth daily.   Yes [provider]  doxycycline (VIBRA-TABS) 100 MG tablet Take 1 tablet (100 mg total) by mouth 2 (two) times daily. 07/03/22   Lynn Ito, MD  insulin aspart protamine- aspart (NOVOLOG MIX  70/30) (70-30) 100 UNIT/ML injection Inject 0.15 mLs (15 Units total) into the skin 2 (two) times daily with a meal. Patient not taking: Reported on 02/15/2023 08/14/21   Enedina Finner, MD  rifampin (RIFADIN) 300 MG capsule Take 1 capsule (300 mg total) by mouth every 12 (twelve) hours. Patient not taking: Reported on 02/12/2023 11/21/21   Kathrynn Running, MD  spironolactone (ALDACTONE) 25 MG tablet Take 0.5 tablets (12.5 mg total) by mouth daily. Patient not taking: Reported on 02/15/2023 08/21/21   Manson Passey, PA     Critical care provider statement:   Total critical care time: 33 minutes   Performed by: Karna Christmas MD   Critical care time was exclusive of separately billable procedures and treating other patients.   Critical care was necessary to treat or prevent imminent or life-threatening deterioration.   Critical care was time spent personally by me on the following activities: development of treatment plan with patient and/or surrogate as well as nursing, discussions with consultants, evaluation of patient's response to  treatment, examination of patient, obtaining history from patient or surrogate, ordering and performing treatments and interventions, ordering and review of laboratory studies, ordering and review of radiographic studies, pulse oximetry and re-evaluation of patient's condition.    Vida Rigger, M.D.  Pulmonary & Critical Care Medicine

## 2023-02-19 NOTE — Progress Notes (Signed)
Per request, chaplain visited to pt bedside with pt wife. Wife is aware of difficult decisions ahead. She expressed her grief and sadness around watching another person she loves hurt and be this sick. She shared her fear that she does not think he can come back from this. Chaplain offered compassionate presence and listening ear. Offered prayer of peace over pt and family. Please call if further support is needed.

## 2023-02-19 DEATH — deceased

## 2023-07-29 ENCOUNTER — Ambulatory Visit: Payer: Medicare Other | Admitting: Infectious Diseases
